# Patient Record
Sex: Female | Born: 1935 | Race: White | Hispanic: No | Smoking: Never smoker
Health system: Southern US, Community
[De-identification: ages and names within clinical notes are randomized; demographics above are authoritative.]

## PROBLEM LIST (undated history)

## (undated) DIAGNOSIS — T7840XA Allergy, unspecified, initial encounter: Secondary | ICD-10-CM

## (undated) DIAGNOSIS — C439 Malignant melanoma of skin, unspecified: Secondary | ICD-10-CM

## (undated) DIAGNOSIS — K449 Diaphragmatic hernia without obstruction or gangrene: Secondary | ICD-10-CM

## (undated) DIAGNOSIS — Z9289 Personal history of other medical treatment: Secondary | ICD-10-CM

## (undated) DIAGNOSIS — D689 Coagulation defect, unspecified: Secondary | ICD-10-CM

## (undated) DIAGNOSIS — K5792 Diverticulitis of intestine, part unspecified, without perforation or abscess without bleeding: Secondary | ICD-10-CM

## (undated) DIAGNOSIS — Z950 Presence of cardiac pacemaker: Secondary | ICD-10-CM

## (undated) DIAGNOSIS — Z923 Personal history of irradiation: Secondary | ICD-10-CM

## (undated) DIAGNOSIS — I2510 Atherosclerotic heart disease of native coronary artery without angina pectoris: Secondary | ICD-10-CM

## (undated) DIAGNOSIS — C73 Malignant neoplasm of thyroid gland: Secondary | ICD-10-CM

## (undated) DIAGNOSIS — M1990 Unspecified osteoarthritis, unspecified site: Secondary | ICD-10-CM

## (undated) DIAGNOSIS — I499 Cardiac arrhythmia, unspecified: Secondary | ICD-10-CM

## (undated) DIAGNOSIS — Z79899 Other long term (current) drug therapy: Secondary | ICD-10-CM

## (undated) DIAGNOSIS — G5600 Carpal tunnel syndrome, unspecified upper limb: Secondary | ICD-10-CM

## (undated) DIAGNOSIS — I4819 Other persistent atrial fibrillation: Secondary | ICD-10-CM

## (undated) DIAGNOSIS — I509 Heart failure, unspecified: Secondary | ICD-10-CM

## (undated) DIAGNOSIS — K529 Noninfective gastroenteritis and colitis, unspecified: Secondary | ICD-10-CM

## (undated) DIAGNOSIS — G4730 Sleep apnea, unspecified: Secondary | ICD-10-CM

## (undated) DIAGNOSIS — IMO0002 Reserved for concepts with insufficient information to code with codable children: Secondary | ICD-10-CM

## (undated) DIAGNOSIS — E7800 Pure hypercholesterolemia, unspecified: Secondary | ICD-10-CM

## (undated) DIAGNOSIS — Z7901 Long term (current) use of anticoagulants: Secondary | ICD-10-CM

## (undated) DIAGNOSIS — E039 Hypothyroidism, unspecified: Secondary | ICD-10-CM

## (undated) DIAGNOSIS — M5030 Other cervical disc degeneration, unspecified cervical region: Secondary | ICD-10-CM

## (undated) DIAGNOSIS — I495 Sick sinus syndrome: Secondary | ICD-10-CM

## (undated) DIAGNOSIS — K219 Gastro-esophageal reflux disease without esophagitis: Secondary | ICD-10-CM

## (undated) DIAGNOSIS — G4733 Obstructive sleep apnea (adult) (pediatric): Secondary | ICD-10-CM

## (undated) DIAGNOSIS — I447 Left bundle-branch block, unspecified: Secondary | ICD-10-CM

## (undated) DIAGNOSIS — E559 Vitamin D deficiency, unspecified: Secondary | ICD-10-CM

## (undated) DIAGNOSIS — H3530 Unspecified macular degeneration: Secondary | ICD-10-CM

## (undated) DIAGNOSIS — R0789 Other chest pain: Secondary | ICD-10-CM

## (undated) DIAGNOSIS — D0360 Melanoma in situ of unspecified upper limb, including shoulder: Secondary | ICD-10-CM

## (undated) DIAGNOSIS — I10 Essential (primary) hypertension: Secondary | ICD-10-CM

## (undated) DIAGNOSIS — C50919 Malignant neoplasm of unspecified site of unspecified female breast: Secondary | ICD-10-CM

## (undated) DIAGNOSIS — F329 Major depressive disorder, single episode, unspecified: Secondary | ICD-10-CM

## (undated) DIAGNOSIS — F32A Depression, unspecified: Secondary | ICD-10-CM

## (undated) DIAGNOSIS — I700 Atherosclerosis of aorta: Secondary | ICD-10-CM

## (undated) DIAGNOSIS — G250 Essential tremor: Secondary | ICD-10-CM

## (undated) DIAGNOSIS — F419 Anxiety disorder, unspecified: Secondary | ICD-10-CM

## (undated) HISTORY — DX: Coagulation defect, unspecified: D68.9

## (undated) HISTORY — DX: Other persistent atrial fibrillation: I48.19

## (undated) HISTORY — DX: Personal history of other medical treatment: Z92.89

## (undated) HISTORY — DX: Reserved for concepts with insufficient information to code with codable children: IMO0002

## (undated) HISTORY — DX: Other chest pain: R07.89

## (undated) HISTORY — DX: Depression, unspecified: F32.A

## (undated) HISTORY — DX: Sleep apnea, unspecified: G47.30

## (undated) HISTORY — DX: Malignant neoplasm of unspecified site of unspecified female breast: C50.919

## (undated) HISTORY — PX: CHOLECYSTECTOMY: SHX55

## (undated) HISTORY — PX: PARTIAL HYSTERECTOMY: SHX80

## (undated) HISTORY — DX: Unspecified osteoarthritis, unspecified site: M19.90

## (undated) HISTORY — DX: Heart failure, unspecified: I50.9

## (undated) HISTORY — DX: Diverticulitis of intestine, part unspecified, without perforation or abscess without bleeding: K57.92

## (undated) HISTORY — DX: Pure hypercholesterolemia, unspecified: E78.00

## (undated) HISTORY — DX: Malignant neoplasm of thyroid gland: C73

## (undated) HISTORY — PX: TONSILLECTOMY: SUR1361

## (undated) HISTORY — DX: Allergy, unspecified, initial encounter: T78.40XA

## (undated) HISTORY — DX: Essential (primary) hypertension: I10

## (undated) HISTORY — PX: CARDIAC CATHETERIZATION: SHX172

## (undated) HISTORY — PX: MELANOMA EXCISION: SHX5266

## (undated) HISTORY — DX: Noninfective gastroenteritis and colitis, unspecified: K52.9

## (undated) HISTORY — DX: Gastro-esophageal reflux disease without esophagitis: K21.9

## (undated) HISTORY — DX: Anxiety disorder, unspecified: F41.9

## (undated) HISTORY — DX: Major depressive disorder, single episode, unspecified: F32.9

## (undated) HISTORY — DX: Melanoma in situ of unspecified upper limb, including shoulder: D03.60

---

## 1971-09-27 HISTORY — PX: ABDOMINAL HYSTERECTOMY: SHX81

## 1990-09-26 DIAGNOSIS — C73 Malignant neoplasm of thyroid gland: Secondary | ICD-10-CM

## 1990-09-26 HISTORY — PX: THYROIDECTOMY, PARTIAL: SHX18

## 1990-09-26 HISTORY — PX: THYROID SURGERY: SHX805

## 1990-09-26 HISTORY — DX: Malignant neoplasm of thyroid gland: C73

## 1993-09-26 HISTORY — PX: BREAST EXCISIONAL BIOPSY: SUR124

## 1995-09-27 DIAGNOSIS — C439 Malignant melanoma of skin, unspecified: Secondary | ICD-10-CM

## 1995-09-27 HISTORY — DX: Malignant melanoma of skin, unspecified: C43.9

## 2001-02-06 ENCOUNTER — Encounter: Payer: Self-pay | Admitting: Family Medicine

## 2001-02-06 ENCOUNTER — Ambulatory Visit (HOSPITAL_COMMUNITY): Admission: RE | Admit: 2001-02-06 | Discharge: 2001-02-06 | Payer: Self-pay | Admitting: Family Medicine

## 2001-02-06 ENCOUNTER — Other Ambulatory Visit: Admission: RE | Admit: 2001-02-06 | Discharge: 2001-02-06 | Payer: Self-pay | Admitting: Family Medicine

## 2001-02-28 ENCOUNTER — Ambulatory Visit (HOSPITAL_COMMUNITY): Admission: RE | Admit: 2001-02-28 | Discharge: 2001-02-28 | Payer: Self-pay | Admitting: Cardiology

## 2001-02-28 ENCOUNTER — Encounter: Payer: Self-pay | Admitting: Cardiology

## 2001-03-01 ENCOUNTER — Ambulatory Visit (HOSPITAL_COMMUNITY): Admission: RE | Admit: 2001-03-01 | Discharge: 2001-03-01 | Payer: Self-pay | Admitting: Family Medicine

## 2001-03-01 ENCOUNTER — Encounter: Payer: Self-pay | Admitting: Family Medicine

## 2001-07-24 ENCOUNTER — Encounter: Payer: Self-pay | Admitting: Family Medicine

## 2001-07-24 ENCOUNTER — Ambulatory Visit (HOSPITAL_COMMUNITY): Admission: RE | Admit: 2001-07-24 | Discharge: 2001-07-24 | Payer: Self-pay | Admitting: Family Medicine

## 2002-03-04 ENCOUNTER — Encounter: Payer: Self-pay | Admitting: Family Medicine

## 2002-03-04 ENCOUNTER — Ambulatory Visit (HOSPITAL_COMMUNITY): Admission: RE | Admit: 2002-03-04 | Discharge: 2002-03-04 | Payer: Self-pay | Admitting: Family Medicine

## 2002-04-29 ENCOUNTER — Ambulatory Visit (HOSPITAL_COMMUNITY): Admission: RE | Admit: 2002-04-29 | Discharge: 2002-04-29 | Payer: Self-pay | Admitting: Family Medicine

## 2002-04-29 ENCOUNTER — Encounter: Payer: Self-pay | Admitting: Family Medicine

## 2003-02-26 ENCOUNTER — Ambulatory Visit (HOSPITAL_COMMUNITY): Admission: RE | Admit: 2003-02-26 | Discharge: 2003-02-26 | Payer: Self-pay | Admitting: Family Medicine

## 2003-02-26 ENCOUNTER — Encounter: Payer: Self-pay | Admitting: Family Medicine

## 2003-03-13 ENCOUNTER — Ambulatory Visit (HOSPITAL_COMMUNITY): Admission: RE | Admit: 2003-03-13 | Discharge: 2003-03-13 | Payer: Self-pay | Admitting: Family Medicine

## 2003-03-13 ENCOUNTER — Encounter: Payer: Self-pay | Admitting: Family Medicine

## 2003-06-23 ENCOUNTER — Ambulatory Visit (HOSPITAL_COMMUNITY): Admission: RE | Admit: 2003-06-23 | Discharge: 2003-06-23 | Payer: Self-pay | Admitting: Cardiology

## 2003-06-23 ENCOUNTER — Encounter: Payer: Self-pay | Admitting: Cardiology

## 2004-02-03 ENCOUNTER — Ambulatory Visit (HOSPITAL_COMMUNITY): Admission: RE | Admit: 2004-02-03 | Discharge: 2004-02-03 | Payer: Self-pay | Admitting: Internal Medicine

## 2004-06-08 ENCOUNTER — Ambulatory Visit: Payer: Self-pay | Admitting: Psychiatry

## 2004-07-19 ENCOUNTER — Ambulatory Visit: Payer: Self-pay | Admitting: Psychology

## 2004-10-05 ENCOUNTER — Ambulatory Visit: Payer: Self-pay | Admitting: Psychology

## 2004-10-19 ENCOUNTER — Ambulatory Visit: Payer: Self-pay | Admitting: Psychiatry

## 2004-10-21 ENCOUNTER — Ambulatory Visit (HOSPITAL_COMMUNITY): Admission: RE | Admit: 2004-10-21 | Discharge: 2004-10-21 | Payer: Self-pay | Admitting: Family Medicine

## 2004-12-09 ENCOUNTER — Ambulatory Visit: Payer: Self-pay | Admitting: Psychiatry

## 2005-01-31 ENCOUNTER — Other Ambulatory Visit: Admission: RE | Admit: 2005-01-31 | Discharge: 2005-01-31 | Payer: Self-pay | Admitting: Dermatology

## 2005-02-10 ENCOUNTER — Ambulatory Visit: Payer: Self-pay | Admitting: Orthopedic Surgery

## 2005-03-08 ENCOUNTER — Ambulatory Visit: Payer: Self-pay | Admitting: Psychiatry

## 2005-05-03 ENCOUNTER — Ambulatory Visit: Payer: Self-pay | Admitting: Psychiatry

## 2005-06-28 ENCOUNTER — Ambulatory Visit: Payer: Self-pay | Admitting: Psychiatry

## 2005-09-15 ENCOUNTER — Ambulatory Visit: Payer: Self-pay | Admitting: Psychology

## 2005-10-24 ENCOUNTER — Ambulatory Visit (HOSPITAL_COMMUNITY): Admission: RE | Admit: 2005-10-24 | Discharge: 2005-10-24 | Payer: Self-pay | Admitting: Family Medicine

## 2005-10-28 ENCOUNTER — Ambulatory Visit: Payer: Self-pay | Admitting: Psychology

## 2005-12-26 ENCOUNTER — Ambulatory Visit (HOSPITAL_COMMUNITY): Admission: RE | Admit: 2005-12-26 | Discharge: 2005-12-26 | Payer: Self-pay | Admitting: Family Medicine

## 2006-02-16 ENCOUNTER — Ambulatory Visit (HOSPITAL_COMMUNITY): Payer: Self-pay | Admitting: Psychiatry

## 2006-02-23 ENCOUNTER — Ambulatory Visit: Payer: Self-pay | Admitting: Orthopedic Surgery

## 2006-03-02 ENCOUNTER — Ambulatory Visit (HOSPITAL_COMMUNITY): Payer: Self-pay | Admitting: Psychology

## 2006-03-09 ENCOUNTER — Ambulatory Visit: Payer: Self-pay | Admitting: Orthopedic Surgery

## 2006-03-14 ENCOUNTER — Ambulatory Visit (HOSPITAL_COMMUNITY): Admission: RE | Admit: 2006-03-14 | Discharge: 2006-03-14 | Payer: Self-pay | Admitting: Orthopedic Surgery

## 2006-04-06 ENCOUNTER — Ambulatory Visit: Payer: Self-pay | Admitting: Orthopedic Surgery

## 2006-04-13 ENCOUNTER — Ambulatory Visit (HOSPITAL_COMMUNITY): Payer: Self-pay | Admitting: Psychiatry

## 2006-04-20 ENCOUNTER — Ambulatory Visit (HOSPITAL_COMMUNITY): Payer: Self-pay | Admitting: Psychiatry

## 2006-04-25 ENCOUNTER — Ambulatory Visit (HOSPITAL_COMMUNITY): Payer: Self-pay | Admitting: Psychology

## 2006-04-27 ENCOUNTER — Encounter: Admission: RE | Admit: 2006-04-27 | Discharge: 2006-04-27 | Payer: Self-pay | Admitting: Orthopedic Surgery

## 2006-05-15 ENCOUNTER — Encounter: Admission: RE | Admit: 2006-05-15 | Discharge: 2006-05-15 | Payer: Self-pay | Admitting: Orthopedic Surgery

## 2006-05-16 ENCOUNTER — Ambulatory Visit (HOSPITAL_COMMUNITY): Payer: Self-pay | Admitting: Psychiatry

## 2006-06-05 ENCOUNTER — Ambulatory Visit (HOSPITAL_COMMUNITY): Payer: Self-pay | Admitting: Psychology

## 2006-06-07 ENCOUNTER — Encounter: Admission: RE | Admit: 2006-06-07 | Discharge: 2006-06-07 | Payer: Self-pay | Admitting: Orthopedic Surgery

## 2006-07-13 ENCOUNTER — Ambulatory Visit (HOSPITAL_COMMUNITY): Payer: Self-pay | Admitting: Psychiatry

## 2006-08-28 ENCOUNTER — Ambulatory Visit (HOSPITAL_COMMUNITY): Payer: Self-pay | Admitting: Psychology

## 2006-09-12 ENCOUNTER — Ambulatory Visit (HOSPITAL_COMMUNITY): Payer: Self-pay | Admitting: Psychiatry

## 2006-11-14 ENCOUNTER — Ambulatory Visit (HOSPITAL_COMMUNITY): Payer: Self-pay | Admitting: Psychiatry

## 2006-12-01 ENCOUNTER — Ambulatory Visit (HOSPITAL_COMMUNITY): Admission: RE | Admit: 2006-12-01 | Discharge: 2006-12-01 | Payer: Self-pay | Admitting: Podiatry

## 2006-12-08 ENCOUNTER — Ambulatory Visit (HOSPITAL_COMMUNITY): Admission: RE | Admit: 2006-12-08 | Discharge: 2006-12-08 | Payer: Self-pay | Admitting: *Deleted

## 2006-12-13 ENCOUNTER — Ambulatory Visit (HOSPITAL_COMMUNITY): Admission: RE | Admit: 2006-12-13 | Discharge: 2006-12-14 | Payer: Self-pay | Admitting: *Deleted

## 2006-12-19 ENCOUNTER — Ambulatory Visit (HOSPITAL_COMMUNITY): Payer: Self-pay | Admitting: Psychiatry

## 2007-02-20 ENCOUNTER — Ambulatory Visit (HOSPITAL_COMMUNITY): Admission: RE | Admit: 2007-02-20 | Discharge: 2007-02-20 | Payer: Self-pay | Admitting: Family Medicine

## 2007-04-19 ENCOUNTER — Ambulatory Visit (HOSPITAL_COMMUNITY): Payer: Self-pay | Admitting: Psychiatry

## 2007-05-25 ENCOUNTER — Ambulatory Visit (HOSPITAL_COMMUNITY): Payer: Self-pay | Admitting: Psychology

## 2007-06-26 ENCOUNTER — Ambulatory Visit (HOSPITAL_COMMUNITY): Payer: Self-pay | Admitting: Psychology

## 2007-07-05 ENCOUNTER — Ambulatory Visit (HOSPITAL_COMMUNITY): Payer: Self-pay | Admitting: Psychiatry

## 2007-08-27 ENCOUNTER — Ambulatory Visit (HOSPITAL_COMMUNITY): Payer: Self-pay | Admitting: Psychology

## 2007-09-24 ENCOUNTER — Ambulatory Visit (HOSPITAL_COMMUNITY): Admission: RE | Admit: 2007-09-24 | Discharge: 2007-09-24 | Payer: Self-pay | Admitting: Ophthalmology

## 2007-09-25 ENCOUNTER — Ambulatory Visit (HOSPITAL_COMMUNITY): Admission: RE | Admit: 2007-09-25 | Discharge: 2007-09-25 | Payer: Self-pay | Admitting: Family Medicine

## 2007-09-28 ENCOUNTER — Ambulatory Visit (HOSPITAL_COMMUNITY): Admission: RE | Admit: 2007-09-28 | Discharge: 2007-09-28 | Payer: Self-pay | Admitting: Family Medicine

## 2007-10-01 ENCOUNTER — Ambulatory Visit (HOSPITAL_COMMUNITY): Admission: RE | Admit: 2007-10-01 | Discharge: 2007-10-01 | Payer: Self-pay | Admitting: Ophthalmology

## 2007-10-09 ENCOUNTER — Encounter: Admission: RE | Admit: 2007-10-09 | Discharge: 2007-10-09 | Payer: Self-pay | Admitting: Family Medicine

## 2007-10-09 ENCOUNTER — Encounter (INDEPENDENT_AMBULATORY_CARE_PROVIDER_SITE_OTHER): Payer: Self-pay | Admitting: Radiology

## 2008-05-08 ENCOUNTER — Ambulatory Visit (HOSPITAL_COMMUNITY): Payer: Self-pay | Admitting: Psychiatry

## 2008-06-05 ENCOUNTER — Ambulatory Visit (HOSPITAL_COMMUNITY): Payer: Self-pay | Admitting: Psychiatry

## 2008-08-07 ENCOUNTER — Ambulatory Visit (HOSPITAL_COMMUNITY): Payer: Self-pay | Admitting: Psychiatry

## 2008-11-06 ENCOUNTER — Ambulatory Visit (HOSPITAL_COMMUNITY): Payer: Self-pay | Admitting: Psychiatry

## 2008-11-10 ENCOUNTER — Ambulatory Visit (HOSPITAL_COMMUNITY): Admission: RE | Admit: 2008-11-10 | Discharge: 2008-11-10 | Payer: Self-pay | Admitting: Family Medicine

## 2008-11-13 ENCOUNTER — Ambulatory Visit (HOSPITAL_COMMUNITY): Admission: RE | Admit: 2008-11-13 | Discharge: 2008-11-13 | Payer: Self-pay | Admitting: Obstetrics & Gynecology

## 2008-12-13 ENCOUNTER — Observation Stay (HOSPITAL_COMMUNITY): Admission: EM | Admit: 2008-12-13 | Discharge: 2008-12-13 | Payer: Self-pay | Admitting: Cardiology

## 2009-03-05 ENCOUNTER — Ambulatory Visit (HOSPITAL_COMMUNITY): Payer: Self-pay | Admitting: Psychiatry

## 2009-05-18 ENCOUNTER — Ambulatory Visit (HOSPITAL_COMMUNITY): Admission: RE | Admit: 2009-05-18 | Discharge: 2009-05-18 | Payer: Self-pay | Admitting: Family Medicine

## 2009-05-20 ENCOUNTER — Encounter (INDEPENDENT_AMBULATORY_CARE_PROVIDER_SITE_OTHER): Payer: Self-pay | Admitting: *Deleted

## 2009-05-20 ENCOUNTER — Ambulatory Visit (HOSPITAL_COMMUNITY): Admission: RE | Admit: 2009-05-20 | Discharge: 2009-05-20 | Payer: Self-pay | Admitting: Family Medicine

## 2009-05-26 ENCOUNTER — Ambulatory Visit: Payer: Self-pay | Admitting: Internal Medicine

## 2009-05-26 ENCOUNTER — Ambulatory Visit (HOSPITAL_COMMUNITY): Admission: RE | Admit: 2009-05-26 | Discharge: 2009-05-26 | Payer: Self-pay | Admitting: Internal Medicine

## 2009-05-26 DIAGNOSIS — R109 Unspecified abdominal pain: Secondary | ICD-10-CM | POA: Insufficient documentation

## 2009-05-26 DIAGNOSIS — R7401 Elevation of levels of liver transaminase levels: Secondary | ICD-10-CM | POA: Insufficient documentation

## 2009-05-26 DIAGNOSIS — R740 Nonspecific elevation of levels of transaminase and lactic acid dehydrogenase [LDH]: Secondary | ICD-10-CM

## 2009-05-26 DIAGNOSIS — K219 Gastro-esophageal reflux disease without esophagitis: Secondary | ICD-10-CM | POA: Insufficient documentation

## 2009-05-27 LAB — CONVERTED CEMR LAB
ALT: 92 units/L — ABNORMAL HIGH (ref 0–35)
AST: 163 units/L — ABNORMAL HIGH (ref 0–37)
Bilirubin, Direct: 0.7 mg/dL — ABNORMAL HIGH (ref 0.0–0.3)
Indirect Bilirubin: 0.5 mg/dL (ref 0.0–0.9)

## 2009-05-28 ENCOUNTER — Encounter: Payer: Self-pay | Admitting: Internal Medicine

## 2009-06-04 ENCOUNTER — Ambulatory Visit (HOSPITAL_COMMUNITY): Admission: RE | Admit: 2009-06-04 | Discharge: 2009-06-04 | Payer: Self-pay | Admitting: Internal Medicine

## 2009-06-04 ENCOUNTER — Ambulatory Visit: Payer: Self-pay | Admitting: Internal Medicine

## 2009-06-10 ENCOUNTER — Telehealth (INDEPENDENT_AMBULATORY_CARE_PROVIDER_SITE_OTHER): Payer: Self-pay | Admitting: *Deleted

## 2009-06-15 ENCOUNTER — Encounter: Payer: Self-pay | Admitting: Internal Medicine

## 2009-07-02 ENCOUNTER — Encounter: Payer: Self-pay | Admitting: Internal Medicine

## 2009-07-02 ENCOUNTER — Ambulatory Visit (HOSPITAL_COMMUNITY): Admission: RE | Admit: 2009-07-02 | Discharge: 2009-07-02 | Payer: Self-pay | Admitting: Gastroenterology

## 2009-07-02 ENCOUNTER — Ambulatory Visit: Payer: Self-pay | Admitting: Gastroenterology

## 2009-07-07 ENCOUNTER — Ambulatory Visit (HOSPITAL_COMMUNITY): Payer: Self-pay | Admitting: Psychiatry

## 2009-07-23 ENCOUNTER — Ambulatory Visit (HOSPITAL_COMMUNITY): Admission: RE | Admit: 2009-07-23 | Discharge: 2009-07-23 | Payer: Self-pay | Admitting: Ophthalmology

## 2009-07-29 ENCOUNTER — Ambulatory Visit (HOSPITAL_COMMUNITY): Admission: RE | Admit: 2009-07-29 | Discharge: 2009-07-29 | Payer: Self-pay | Admitting: Ophthalmology

## 2009-08-03 ENCOUNTER — Encounter: Payer: Self-pay | Admitting: Internal Medicine

## 2009-08-05 ENCOUNTER — Encounter: Payer: Self-pay | Admitting: Internal Medicine

## 2009-08-31 ENCOUNTER — Ambulatory Visit (HOSPITAL_COMMUNITY): Admission: RE | Admit: 2009-08-31 | Discharge: 2009-08-31 | Payer: Self-pay | Admitting: Family Medicine

## 2009-09-26 DIAGNOSIS — N6489 Other specified disorders of breast: Secondary | ICD-10-CM

## 2009-09-26 DIAGNOSIS — Z923 Personal history of irradiation: Secondary | ICD-10-CM

## 2009-09-26 DIAGNOSIS — C50919 Malignant neoplasm of unspecified site of unspecified female breast: Secondary | ICD-10-CM

## 2009-09-26 HISTORY — PX: BREAST EXCISIONAL BIOPSY: SUR124

## 2009-09-26 HISTORY — DX: Personal history of irradiation: Z92.3

## 2009-09-26 HISTORY — DX: Other specified disorders of breast: N64.89

## 2009-09-26 HISTORY — PX: BREAST LUMPECTOMY: SHX2

## 2009-09-26 HISTORY — DX: Malignant neoplasm of unspecified site of unspecified female breast: C50.919

## 2009-11-16 ENCOUNTER — Ambulatory Visit (HOSPITAL_COMMUNITY): Admission: RE | Admit: 2009-11-16 | Discharge: 2009-11-16 | Payer: Self-pay | Admitting: Family Medicine

## 2009-11-25 ENCOUNTER — Ambulatory Visit (HOSPITAL_COMMUNITY): Admission: RE | Admit: 2009-11-25 | Discharge: 2009-11-25 | Payer: Self-pay | Admitting: Family Medicine

## 2009-12-03 ENCOUNTER — Encounter: Admission: RE | Admit: 2009-12-03 | Discharge: 2009-12-03 | Payer: Self-pay | Admitting: Family Medicine

## 2009-12-15 ENCOUNTER — Ambulatory Visit (HOSPITAL_COMMUNITY): Payer: Self-pay | Admitting: Psychiatry

## 2009-12-16 ENCOUNTER — Observation Stay (HOSPITAL_COMMUNITY): Admission: RE | Admit: 2009-12-16 | Discharge: 2009-12-17 | Payer: Self-pay | Admitting: General Surgery

## 2010-01-26 ENCOUNTER — Ambulatory Visit (HOSPITAL_COMMUNITY): Payer: Self-pay | Admitting: Oncology

## 2010-01-29 ENCOUNTER — Ambulatory Visit (HOSPITAL_COMMUNITY): Admission: RE | Admit: 2010-01-29 | Discharge: 2010-01-29 | Payer: Self-pay | Admitting: Oncology

## 2010-02-10 ENCOUNTER — Ambulatory Visit: Admission: RE | Admit: 2010-02-10 | Discharge: 2010-03-25 | Payer: Self-pay | Admitting: Radiation Oncology

## 2010-02-26 ENCOUNTER — Ambulatory Visit (HOSPITAL_COMMUNITY): Admission: RE | Admit: 2010-02-26 | Discharge: 2010-02-26 | Payer: Self-pay | Admitting: Family Medicine

## 2010-03-16 ENCOUNTER — Ambulatory Visit (HOSPITAL_COMMUNITY): Payer: Self-pay | Admitting: Psychiatry

## 2010-03-30 ENCOUNTER — Ambulatory Visit (HOSPITAL_COMMUNITY): Payer: Self-pay | Admitting: Oncology

## 2010-06-15 ENCOUNTER — Ambulatory Visit (HOSPITAL_COMMUNITY): Payer: Self-pay | Admitting: Psychiatry

## 2010-09-06 ENCOUNTER — Encounter (HOSPITAL_COMMUNITY)
Admission: RE | Admit: 2010-09-06 | Discharge: 2010-10-06 | Payer: Self-pay | Source: Home / Self Care | Attending: Oncology | Admitting: Oncology

## 2010-09-06 ENCOUNTER — Ambulatory Visit (HOSPITAL_COMMUNITY): Payer: Self-pay | Admitting: Oncology

## 2010-09-14 ENCOUNTER — Ambulatory Visit (HOSPITAL_COMMUNITY): Payer: Self-pay | Admitting: Psychiatry

## 2010-09-23 ENCOUNTER — Ambulatory Visit (HOSPITAL_COMMUNITY)
Admission: RE | Admit: 2010-09-23 | Discharge: 2010-09-23 | Payer: Self-pay | Source: Home / Self Care | Attending: Psychiatry | Admitting: Psychiatry

## 2010-10-16 ENCOUNTER — Other Ambulatory Visit (HOSPITAL_COMMUNITY): Payer: Self-pay | Admitting: Oncology

## 2010-10-16 DIAGNOSIS — M8580 Other specified disorders of bone density and structure, unspecified site: Secondary | ICD-10-CM

## 2010-10-17 ENCOUNTER — Encounter: Payer: Self-pay | Admitting: Family Medicine

## 2010-10-27 ENCOUNTER — Ambulatory Visit (HOSPITAL_COMMUNITY): Payer: Medicare Other | Admitting: Oncology

## 2010-10-27 ENCOUNTER — Encounter (HOSPITAL_COMMUNITY): Admission: RE | Admit: 2010-10-27 | Payer: Self-pay | Source: Home / Self Care | Admitting: Oncology

## 2010-10-27 DIAGNOSIS — C50919 Malignant neoplasm of unspecified site of unspecified female breast: Secondary | ICD-10-CM

## 2010-11-22 ENCOUNTER — Other Ambulatory Visit: Payer: Self-pay | Admitting: Radiation Oncology

## 2010-11-22 DIAGNOSIS — C50919 Malignant neoplasm of unspecified site of unspecified female breast: Secondary | ICD-10-CM

## 2010-11-23 ENCOUNTER — Encounter (INDEPENDENT_AMBULATORY_CARE_PROVIDER_SITE_OTHER): Payer: Medicare Other | Admitting: Psychiatry

## 2010-11-23 DIAGNOSIS — F332 Major depressive disorder, recurrent severe without psychotic features: Secondary | ICD-10-CM

## 2010-12-20 LAB — MRSA PCR SCREENING: MRSA by PCR: NEGATIVE

## 2010-12-20 LAB — CROSSMATCH

## 2010-12-20 LAB — BASIC METABOLIC PANEL
CO2: 30 mEq/L (ref 19–32)
Chloride: 106 mEq/L (ref 96–112)
Creatinine, Ser: 0.84 mg/dL (ref 0.4–1.2)

## 2010-12-20 LAB — CBC
HCT: 35.9 % — ABNORMAL LOW (ref 36.0–46.0)
Hemoglobin: 12.5 g/dL (ref 12.0–15.0)
MCV: 90 fL (ref 78.0–100.0)
Platelets: 221 10*3/uL (ref 150–400)
WBC: 6.3 10*3/uL (ref 4.0–10.5)

## 2010-12-20 LAB — ABO/RH: ABO/RH(D): O POS

## 2010-12-20 LAB — PROTIME-INR: INR: 1.09 (ref 0.00–1.49)

## 2010-12-29 ENCOUNTER — Ambulatory Visit (HOSPITAL_COMMUNITY)
Admission: RE | Admit: 2010-12-29 | Discharge: 2010-12-29 | Disposition: A | Discharge status: Home / Self Care | Payer: Medicare Other | Source: Ambulatory Visit | Attending: Radiation Oncology | Admitting: Radiation Oncology

## 2010-12-29 DIAGNOSIS — C50919 Malignant neoplasm of unspecified site of unspecified female breast: Secondary | ICD-10-CM

## 2010-12-29 DIAGNOSIS — Z853 Personal history of malignant neoplasm of breast: Secondary | ICD-10-CM | POA: Insufficient documentation

## 2010-12-30 LAB — BASIC METABOLIC PANEL
Chloride: 106 mEq/L (ref 96–112)
GFR calc non Af Amer: >60 mL/min (ref >60)
Potassium: 3.5 mEq/L (ref 3.5–5.1)
Sodium: 141 mEq/L (ref 135–145)

## 2010-12-30 LAB — HEMOGLOBIN AND HEMATOCRIT, BLOOD
HCT: 35.6 % — ABNORMAL LOW (ref 36.0–46.0)
Hemoglobin: 12.3 g/dL (ref 12.0–15.0)

## 2010-12-31 LAB — PROTIME-INR
INR: 2.2 — ABNORMAL HIGH (ref 0.00–1.49)
Prothrombin Time: 24.5 seconds — ABNORMAL HIGH (ref 11.6–15.2)

## 2011-01-06 LAB — CBC
HCT: 34.6 % — ABNORMAL LOW (ref 36.0–46.0)
Hemoglobin: 11.8 g/dL — ABNORMAL LOW (ref 12.0–15.0)
MCHC: 34.1 g/dL (ref 30.0–36.0)
MCHC: 33.9 g/dL (ref 30.0–36.0)
MCV: 91.9 fL (ref 78.0–100.0)
Platelets: 228 10*3/uL (ref 150–400)
RBC: 3.77 MIL/uL — ABNORMAL LOW (ref 3.87–5.11)
RDW: 12.5 % (ref 11.5–15.5)
RDW: 12.5 % (ref 11.5–15.5)

## 2011-01-06 LAB — BASIC METABOLIC PANEL
BUN: 14 mg/dL (ref 6–23)
CO2: 29 mEq/L (ref 19–32)
Calcium: 9.6 mg/dL (ref 8.4–10.5)
Chloride: 100 mEq/L (ref 96–112)
Creatinine, Ser: 0.8 mg/dL (ref 0.4–1.2)

## 2011-01-06 LAB — COMPREHENSIVE METABOLIC PANEL
Albumin: 3.1 g/dL — ABNORMAL LOW (ref 3.5–5.2)
Alkaline Phosphatase: 93 U/L (ref 39–117)
BUN: 11 mg/dL (ref 6–23)
Chloride: 102 mEq/L (ref 96–112)
Creatinine, Ser: 0.74 mg/dL (ref 0.4–1.2)
Glucose, Bld: 93 mg/dL (ref 70–99)
Total Bilirubin: 0.5 mg/dL (ref 0.3–1.2)
Total Protein: 5.6 g/dL — ABNORMAL LOW (ref 6.0–8.3)

## 2011-01-06 LAB — DIFFERENTIAL
Basophils Absolute: 0 10*3/uL (ref 0.0–0.1)
Basophils Relative: 1 % (ref 0–1)
Eosinophils Absolute: 0.1 10*3/uL (ref 0.0–0.7)
Monocytes Relative: 10 % (ref 3–12)
Neutro Abs: 3.1 10*3/uL (ref 1.7–7.7)
Neutrophils Relative %: 49 % (ref 43–77)

## 2011-01-06 LAB — POCT CARDIAC MARKERS
CKMB, poc: <1 ng/mL — ABNORMAL LOW (ref 1.0–8.0)
Troponin i, poc: <0.05 ng/mL (ref 0.00–0.09)
Troponin i, poc: <0.05 ng/mL (ref 0.00–0.09)

## 2011-01-06 LAB — PROTIME-INR
INR: 2.1 — ABNORMAL HIGH (ref 0.00–1.49)
Prothrombin Time: 25.3 seconds — ABNORMAL HIGH (ref 11.6–15.2)

## 2011-01-20 ENCOUNTER — Encounter (INDEPENDENT_AMBULATORY_CARE_PROVIDER_SITE_OTHER): Payer: Medicare Other | Admitting: Psychiatry

## 2011-01-20 DIAGNOSIS — F331 Major depressive disorder, recurrent, moderate: Secondary | ICD-10-CM

## 2011-02-11 NOTE — Discharge Summary (Signed)
NAME:  [name], [name] NO.:  [id number]   MEDICAL RECORD NO.:  [id number]          PATIENT TYPE:  OIB   LOCATION:  5703                         FACILITY:  [facility]   PHYSICIAN:  Lezlie Octave, N.P.     DATE OF BIRTH:  05-17-1931   DATE OF ADMISSION:  12/13/2006  DATE OF DISCHARGE:  12/14/2006                               DISCHARGE SUMMARY   Ms. Kerri Mills is a 77 year old white female patient who was seen by  Dr. Domingo Sep in the [place] office.  She apparently had worn a  Holter.  She had PAF, multiple other cardiac risk factors.  It was  decided that she should undergo cardiac catheterization prior to being  placed on Coumadin.  Thus, she came in for an outpatient cardiac  catheterization.  She had normal coronary arteries.  EF of 50%.  She had  some apical hypokinesis.  Catheterization was performed by Dr. Darlin Priestly.  She was to be discharged home; however, she developed a  hematoma, so she was kept overnight.  The following morning on December 14, 2006, she was seen by Dr. Clarene Duke.  She had no bruit or thrill at her  hematoma.  It was only a large bruise.  Her hemoglobin was 11.4.  Her  BUN was 10, creatinine was 0.99, potassium was 3.8, her INR was 1.1.  She had received one dose of Coumadin the night before.  It was decided  that she should start her Coumadin on Sunday.  She will have her blood  checked on Thursday.  It was decided also to decrease her Coumadin dose  down to 2.5 because she was also on Tricor, and I do have an  interaction.   DISCHARGE MEDICATIONS:  1. Coumadin 2.5 mg at 5 or 6 p.m.  She should start that on Sunday      daily.  2. Prilosec 20 mg a day.  3. Gabapentin 300 mg daily and 600 mg at bedtime.  4. Cymbalta 60 mg daily.  5. Avalide 300/25 a half a tablet daily.  6. Levothyroxine 100 mcg a day.  7. Ocuvite daily.  8. Metoprolol 12.5 twice daily.  9. Tricor 145 mg a day.   She should do no strenuous activity, no lifting,  pushing, pulling, or  prolonged walking for one week.  She will go to Dr. Roque Lias office on  Thursday to pick up her last slip to get her pro time checked, and she  will return to see Dr. Domingo Sep on April 4 at 2 p.m.   DISCHARGE DIAGNOSES:  1. Paroxysmal atrial fibrillation.  2. History of hypertension.  3. Hyperlipidemia.  4. Hypothyroidism.  5. History of partial thyroidectomy for thyroid cancer.  6. History of depression.  7. History of deep venous thrombosis.  8. History of obstructive sleep apnea.  9. History of reflux disease.  10.Ejection fraction of 50%.  11.Paroxysmal atrial fibrillation, now to be placed on Coumadin.      Lezlie Octave, N.P.     BB/MEDQ  D:  12/14/2006  T:  12/14/2006  Job:  [id number]   cc:   Patrica Duel, M.D.

## 2011-02-11 NOTE — Cardiovascular Report (Signed)
NAMEMIRCA, YALE NO.:  [id number]   MEDICAL RECORD NO.:  [id number]          PATIENT TYPE:  OIB   LOCATION:  2807                         FACILITY:  [facility]   PHYSICIAN:  Kerri Priestly, Kerri Mills  DATE OF BIRTH:  04-Feb-1931   DATE OF PROCEDURE:  12/13/2006  DATE OF DISCHARGE:                            CARDIAC CATHETERIZATION   PROCEDURES:  1. Left heart catheterization.  2. Coronary angiography.  3. Left ventriculogram.  4. Abdominal aortogram.   ATTENDING:  Dr. Lenise Herald   COMPLICATIONS:  None.   INDICATION:  Kerri Mills is a 77 year old female patient of Dr. Patrica Duel and Dr. Kem Boroughs with a history of hypertension,  hyperlipidemia, hypothyroidism and recently diagnosed with paroxysmal  atrial fibrillation.  She is now referred for cardiac catheterization  prior to initiating Coumadin therapy.   DESCRIPTION OF OPERATION:  After obtaining informed consent, the patient  was brought to the cardiac cath lab, right groin shaved, prepped and  draped in a sterile fashion.  Anesthesia monitor established.  Using the  modified Seldinger technique, a #6-French intraarterial sheath was  inserted in the right femoral artery.  A 6-French diagnostic catheter  was used to perform diagnostic angiography.   The left main is a large vessel with no evidence of disease.   The LAD is a medium-size vessel which courses to give rise to three  diagonal branches.  The LAD has noncritical disease.   First and second diagonal are small vessels with no evidence of disease.   The third diagonal is a medium-size vessel which bifurcates distally  with no evidence of disease.   The left circumflex is a medium-size vessel which courses to the AV  groove and gives rise to two obtuse marginal branches.  The AV  circumflex has no evidence of disease.   The first OM is a medium-size vessel which bifurcates distally with no  evidence of disease.   The second OM is a  small vessel with no evidence of disease.   The right coronary artery is a medium-size vessel which is dominant and  gives rise to both PDA and posterolateral branch.  There is no  significant disease in the RCA, PDA or posterolateral branch.   Left ventriculogram reveals preserved EF of 50%.  There is mild apical  hypokinesis.   Abdominal aortogram reveals no evidence of significant renal artery  stenosis.   Hemodynamics:  Systemic arterial pressure 142/53, LV systemic pressure  142/9, LVEDP of 14.   CONCLUSION:  1. No significant CAD.  2. Low-normal EF with wall motion abnormality noted above.  3. No evidence of renal artery stenosis.      Kerri Priestly, Kerri Mills  Electronically Signed     RHM/MEDQ  D:  12/13/2006  T:  12/13/2006  Job:  [id number]   cc:   Patrica Duel, M.D.  Dani Gobble, Kerri Mills

## 2011-02-11 NOTE — Op Note (Signed)
NAME:  Kerri Mills, CONES                            ACCOUNT NO.:  [id number]   MEDICAL RECORD NO.:  [id number]                   PATIENT TYPE:  AMB   LOCATION:  DAY                                  FACILITY:  [facility]   PHYSICIAN:  R. Roetta Sessions, M.D.              DATE OF BIRTH:  [birth year]   DATE OF PROCEDURE:  02/03/2004  DATE OF DISCHARGE:                                 OPERATIVE REPORT   PROCEDURE:  Screening colonoscopy.   INDICATIONS:  The patient is a 77 year old lady who was referred for  colorectal cancer screening.  She has been devoid of any lower GI symptoms.  She says she had a colonoscopy by Dr. Doristine Counter in [place] about 11 years  ago with negative findings.  Colonoscopy is now being done.  There is no  family history of colorectal neoplasia.  This approach has been discussed  with the patient at length.  The potential risks, benefits and alternatives  have been reviewed and questions answered.  Please see documentation in the  medical record.   DESCRIPTION OF PROCEDURE:  Oxygen saturation, blood pressure, pulse and  respiration were monitored throughout the entire procedure.  Conscious  sedation with Versed 4 mg IV and Demerol 100 mg IV in divided doses.  The  instrument was the Olympus video chip colonoscope.   FINDINGS:  Digital exam revealed no abnormalities.   ENDOSCOPIC FINDINGS:  Prep was good.  Rectum:  Examination of the rectal mucosa including retroflexion in the anal  verge revealed only internal hemorrhoids.  Colon:  Colonic mucosa was surveyed from the rectosigmoid junction to the  left, transverse,  right colon to the area of the appendiceal orifice,  ileocecal valve and cecum. These structures were well seen and photographed  for the record.  From the level the scope was slowly withdrawn.  All  previously mentioned mucosal surfaces were again seen.  The patient's colon  was elongated and tortuous.  Otherwise colonic mucosa appeared normal.  The  patient  tolerated the procedure well and was reactive after endoscopy.   IMPRESSION:  1. Internal hemorrhoids; otherwise normal rectum.  2. Tortuous, elongated, but otherwise normal colon.   RECOMMENDATIONS:  Repeat colonoscopy in 10 years.      ___________________________________________                                            Jonathon Bellows, M.D.   RMR/MEDQ  D:  02/03/2004  T:  02/04/2004  Job:  [id number]

## 2011-03-09 ENCOUNTER — Encounter (HOSPITAL_COMMUNITY): Payer: Self-pay

## 2011-03-09 DIAGNOSIS — K219 Gastro-esophageal reflux disease without esophagitis: Secondary | ICD-10-CM | POA: Insufficient documentation

## 2011-03-19 ENCOUNTER — Encounter (HOSPITAL_COMMUNITY): Payer: Self-pay | Admitting: Oncology

## 2011-03-19 ENCOUNTER — Other Ambulatory Visit (HOSPITAL_COMMUNITY): Payer: Self-pay | Admitting: Oncology

## 2011-03-19 DIAGNOSIS — C50919 Malignant neoplasm of unspecified site of unspecified female breast: Secondary | ICD-10-CM

## 2011-03-19 DIAGNOSIS — Z85850 Personal history of malignant neoplasm of thyroid: Secondary | ICD-10-CM | POA: Insufficient documentation

## 2011-03-19 DIAGNOSIS — C73 Malignant neoplasm of thyroid gland: Secondary | ICD-10-CM

## 2011-03-19 DIAGNOSIS — D0360 Melanoma in situ of unspecified upper limb, including shoulder: Secondary | ICD-10-CM

## 2011-03-19 HISTORY — DX: Melanoma in situ of unspecified upper limb, including shoulder: D03.60

## 2011-04-05 ENCOUNTER — Other Ambulatory Visit (HOSPITAL_COMMUNITY): Payer: Self-pay | Admitting: Oncology

## 2011-04-06 ENCOUNTER — Ambulatory Visit (HOSPITAL_COMMUNITY): Payer: Self-pay | Admitting: Oncology

## 2011-04-12 ENCOUNTER — Encounter (INDEPENDENT_AMBULATORY_CARE_PROVIDER_SITE_OTHER): Payer: Medicare Other | Admitting: Psychiatry

## 2011-04-12 DIAGNOSIS — F331 Major depressive disorder, recurrent, moderate: Secondary | ICD-10-CM

## 2011-04-14 ENCOUNTER — Encounter (HOSPITAL_COMMUNITY): Payer: Medicare Other | Admitting: Psychiatry

## 2011-04-19 ENCOUNTER — Encounter (HOSPITAL_COMMUNITY): Payer: No Typology Code available for payment source | Admitting: Psychology

## 2011-05-02 ENCOUNTER — Encounter (INDEPENDENT_AMBULATORY_CARE_PROVIDER_SITE_OTHER): Payer: Medicare Other | Admitting: Psychology

## 2011-05-02 DIAGNOSIS — F332 Major depressive disorder, recurrent severe without psychotic features: Secondary | ICD-10-CM

## 2011-05-02 DIAGNOSIS — F411 Generalized anxiety disorder: Secondary | ICD-10-CM

## 2011-05-16 ENCOUNTER — Encounter (INDEPENDENT_AMBULATORY_CARE_PROVIDER_SITE_OTHER): Payer: Medicare Other | Admitting: Psychology

## 2011-05-16 DIAGNOSIS — F411 Generalized anxiety disorder: Secondary | ICD-10-CM

## 2011-05-16 DIAGNOSIS — F332 Major depressive disorder, recurrent severe without psychotic features: Secondary | ICD-10-CM

## 2011-05-19 ENCOUNTER — Other Ambulatory Visit (HOSPITAL_COMMUNITY): Payer: Self-pay | Admitting: Family Medicine

## 2011-05-19 ENCOUNTER — Ambulatory Visit (HOSPITAL_COMMUNITY)
Admission: RE | Admit: 2011-05-19 | Discharge: 2011-05-19 | Disposition: A | Discharge status: Home / Self Care | Payer: Medicare Other | Source: Ambulatory Visit | Attending: Family Medicine | Admitting: Family Medicine

## 2011-05-19 DIAGNOSIS — M25539 Pain in unspecified wrist: Secondary | ICD-10-CM

## 2011-05-19 DIAGNOSIS — S59919A Unspecified injury of unspecified forearm, initial encounter: Secondary | ICD-10-CM | POA: Insufficient documentation

## 2011-05-19 DIAGNOSIS — S6990XA Unspecified injury of unspecified wrist, hand and finger(s), initial encounter: Secondary | ICD-10-CM | POA: Insufficient documentation

## 2011-05-19 DIAGNOSIS — S59909A Unspecified injury of unspecified elbow, initial encounter: Secondary | ICD-10-CM | POA: Insufficient documentation

## 2011-05-19 DIAGNOSIS — W19XXXA Unspecified fall, initial encounter: Secondary | ICD-10-CM | POA: Insufficient documentation

## 2011-05-31 ENCOUNTER — Other Ambulatory Visit (HOSPITAL_COMMUNITY): Payer: Self-pay | Admitting: Family Medicine

## 2011-05-31 ENCOUNTER — Ambulatory Visit (HOSPITAL_COMMUNITY)
Admission: RE | Admit: 2011-05-31 | Discharge: 2011-05-31 | Disposition: A | Discharge status: Home / Self Care | Payer: Medicare Other | Source: Ambulatory Visit | Attending: Family Medicine | Admitting: Family Medicine

## 2011-05-31 DIAGNOSIS — M76899 Other specified enthesopathies of unspecified lower limb, excluding foot: Secondary | ICD-10-CM

## 2011-05-31 DIAGNOSIS — M25569 Pain in unspecified knee: Secondary | ICD-10-CM | POA: Insufficient documentation

## 2011-06-08 ENCOUNTER — Encounter (HOSPITAL_COMMUNITY): Payer: Medicare Other | Admitting: Psychology

## 2011-07-05 ENCOUNTER — Encounter (HOSPITAL_COMMUNITY): Payer: No Typology Code available for payment source | Admitting: Psychiatry

## 2011-07-19 ENCOUNTER — Encounter (INDEPENDENT_AMBULATORY_CARE_PROVIDER_SITE_OTHER): Payer: Medicare Other | Admitting: Psychiatry

## 2011-07-19 DIAGNOSIS — F331 Major depressive disorder, recurrent, moderate: Secondary | ICD-10-CM

## 2011-08-05 ENCOUNTER — Encounter (HOSPITAL_COMMUNITY): Payer: Self-pay | Admitting: *Deleted

## 2011-08-05 ENCOUNTER — Other Ambulatory Visit (HOSPITAL_COMMUNITY): Payer: Self-pay | Admitting: Psychiatry

## 2011-08-05 NOTE — Telephone Encounter (Signed)
This encounter was created in error - please disregard.

## 2011-09-01 ENCOUNTER — Ambulatory Visit (HOSPITAL_COMMUNITY)
Admission: RE | Admit: 2011-09-01 | Discharge: 2011-09-01 | Disposition: A | Discharge status: Home / Self Care | Payer: Medicare Other | Source: Ambulatory Visit | Attending: Physician Assistant | Admitting: Physician Assistant

## 2011-09-01 ENCOUNTER — Other Ambulatory Visit (HOSPITAL_COMMUNITY): Payer: Self-pay | Admitting: Physician Assistant

## 2011-09-01 DIAGNOSIS — Z01419 Encounter for gynecological examination (general) (routine) without abnormal findings: Secondary | ICD-10-CM

## 2011-09-01 DIAGNOSIS — R079 Chest pain, unspecified: Secondary | ICD-10-CM | POA: Insufficient documentation

## 2011-09-27 DIAGNOSIS — K5792 Diverticulitis of intestine, part unspecified, without perforation or abscess without bleeding: Secondary | ICD-10-CM

## 2011-09-27 HISTORY — PX: COLONOSCOPY: SHX174

## 2011-09-27 HISTORY — PX: ESOPHAGOGASTRODUODENOSCOPY: SHX1529

## 2011-09-27 HISTORY — DX: Diverticulitis of intestine, part unspecified, without perforation or abscess without bleeding: K57.92

## 2011-10-11 ENCOUNTER — Ambulatory Visit (INDEPENDENT_AMBULATORY_CARE_PROVIDER_SITE_OTHER): Payer: Medicare Other | Admitting: Psychiatry

## 2011-10-11 ENCOUNTER — Encounter (HOSPITAL_COMMUNITY): Payer: Self-pay | Admitting: Psychiatry

## 2011-10-11 ENCOUNTER — Encounter (HOSPITAL_COMMUNITY): Payer: Medicare Other | Admitting: Psychiatry

## 2011-10-11 VITALS — Wt 187.0 lb

## 2011-10-11 DIAGNOSIS — F329 Major depressive disorder, single episode, unspecified: Secondary | ICD-10-CM

## 2011-10-11 NOTE — Progress Notes (Signed)
Patient came for her followup appointment. She was last seen in 07/19/2011. At that time patient was going through the grief as husband passed away in 05/12/23. Patient continues to have chronic depression anxiety and crying spells. She continues to have issues with her stepdaughter as she is living in the same home. Patient is still trying to resolve legal issues since the death of her husband. At times she complain of insomnia but she also not taking Neurontin on a schedule time. Overall she believe Neurontin and Cymbalta helping her. She reported no side effects of medication. She is scheduled to see primary care physician on March 15. She denies any agitation anger however complain of frustration when she deals with stepdaughter.  Mental status examination Patient is an elderly woman who is well groomed and casually dressed. She described her mood is anxious and depressed and her affect is constricted. She denies any active or passive suicidal thoughts or homicidal thoughts. She denies any auditory or visual hallucination. There no psychotic symptoms present. She maintained fair eye contact. Her speech is soft clear and coherent. She's alert and oriented x3. Her insight judgment and impulse control is okay.  Assessment Major depressive disorder  Plan I talked to the patient about her current symptoms I do believe patient need to restart counseling with a therapist. I recommended to see a psychologist in this office who she used to see before. For now we'll continue her Cymbalta 60 mg and Neurontin 600 mg at bedtime. She still has leftover refill and does not need a new prescription at this time. I explained risks and benefits of medication. I recommended to call us if she has any question or concern about the medication or if she feels worsening of the symptoms. I will see her again in 3 months

## 2011-10-20 ENCOUNTER — Ambulatory Visit (HOSPITAL_COMMUNITY): Payer: Medicare Other | Admitting: Psychology

## 2011-11-03 ENCOUNTER — Other Ambulatory Visit (HOSPITAL_COMMUNITY): Payer: Self-pay | Admitting: Psychiatry

## 2011-11-03 DIAGNOSIS — F331 Major depressive disorder, recurrent, moderate: Secondary | ICD-10-CM

## 2011-11-30 ENCOUNTER — Other Ambulatory Visit (HOSPITAL_COMMUNITY): Payer: Self-pay | Admitting: Internal Medicine

## 2011-11-30 DIAGNOSIS — Z139 Encounter for screening, unspecified: Secondary | ICD-10-CM

## 2011-12-06 ENCOUNTER — Other Ambulatory Visit (HOSPITAL_COMMUNITY): Payer: Self-pay | Admitting: Internal Medicine

## 2011-12-06 DIAGNOSIS — Z139 Encounter for screening, unspecified: Secondary | ICD-10-CM

## 2012-01-04 ENCOUNTER — Other Ambulatory Visit (HOSPITAL_COMMUNITY): Payer: Self-pay | Admitting: Internal Medicine

## 2012-01-04 ENCOUNTER — Ambulatory Visit (HOSPITAL_COMMUNITY)
Admission: RE | Admit: 2012-01-04 | Discharge: 2012-01-04 | Disposition: A | Discharge status: Home / Self Care | Payer: Medicare Other | Source: Ambulatory Visit | Attending: Internal Medicine | Admitting: Internal Medicine

## 2012-01-04 ENCOUNTER — Encounter (HOSPITAL_COMMUNITY): Payer: Self-pay

## 2012-01-04 DIAGNOSIS — N6039 Fibrosclerosis of unspecified breast: Secondary | ICD-10-CM | POA: Insufficient documentation

## 2012-01-04 DIAGNOSIS — Z139 Encounter for screening, unspecified: Secondary | ICD-10-CM

## 2012-01-04 DIAGNOSIS — Z853 Personal history of malignant neoplasm of breast: Secondary | ICD-10-CM | POA: Insufficient documentation

## 2012-01-04 NOTE — Progress Notes (Signed)
Patient tolerated procedure well, discharge instructions given.

## 2012-01-10 ENCOUNTER — Ambulatory Visit (HOSPITAL_COMMUNITY): Payer: Medicare Other | Admitting: Psychiatry

## 2012-01-12 ENCOUNTER — Other Ambulatory Visit (HOSPITAL_COMMUNITY): Payer: Self-pay | Admitting: Psychiatry

## 2012-01-12 DIAGNOSIS — F329 Major depressive disorder, single episode, unspecified: Secondary | ICD-10-CM

## 2012-01-17 ENCOUNTER — Ambulatory Visit (HOSPITAL_COMMUNITY): Payer: Medicare Other | Admitting: Psychiatry

## 2012-01-31 ENCOUNTER — Other Ambulatory Visit (HOSPITAL_COMMUNITY): Payer: Medicare Other

## 2012-02-03 ENCOUNTER — Other Ambulatory Visit (HOSPITAL_COMMUNITY): Payer: Self-pay | Admitting: *Deleted

## 2012-02-07 ENCOUNTER — Encounter (HOSPITAL_COMMUNITY): Payer: Self-pay | Admitting: Psychiatry

## 2012-02-07 ENCOUNTER — Ambulatory Visit (INDEPENDENT_AMBULATORY_CARE_PROVIDER_SITE_OTHER): Payer: Medicare Other | Admitting: Psychiatry

## 2012-02-07 DIAGNOSIS — F329 Major depressive disorder, single episode, unspecified: Secondary | ICD-10-CM

## 2012-02-07 NOTE — Progress Notes (Signed)
Chief complaint Medication management and followup.  History of presenting illness Patient is 78 year old widowed Caucasian female who came for her followup appointment.  She had missed her last appointment .  She told her brother died after losing battle with lung cancer.  She was busy in funeral and going through the grief process.  Overall patient is stable on her current psychiatric medication.  She is concerned about her head shake and nodding .  She has notice that other people were talking in funeral about her head shake .  She denies any fall , muscle stiffness or any memory impairment .  She denies any hand shakes or tremors.  She is scheduled to see her primary care physician for further workup.  Overall her mood has been stable and she denies any recent crying spells or agitation.  She generally side effects of medication.  Current psychiatric medication Neurontin 300 mg 2 at bedtime Cymbalta 60 mg daily  Past psychiatric history Patient has been seeing in this office since 2003.  She denies a history of previous suicidal attempt or any inpatient psychiatric treatment.  In the past she has taken Lexapro and Wellbutrin but did not had a good response with these medication.  Medical history She has recently change her primary care physician.  She was seeing [place] physician assistant however she was not happy and now she is scheduled to see Dr. Suzan Slick hall.  She has history of thyroid cancer, acid reflux and hypertension.  She takes Coumadin.  Psychosocial history Patient lives by herself.  Last year her husband died.  She has one daughter who lives close by.  Her daughter is concern about her and want her to live in assisted living.  Mental status examination Patient is an elderly woman who is well groomed and casually dressed. She described her mood is anxious and depressed and her affect is constricted. She denies any active or passive suicidal thoughts or homicidal thoughts. She denies  any auditory or visual hallucination. There no psychotic symptoms present. She maintained fair eye contact. Her speech is soft clear and coherent. She's alert and oriented x3. Her insight judgment and impulse control is okay.  Assessment Axis I Major depressive disorder Axis II deferred Axis III see medical history Axis IV mild to moderate  Plan I encourage her to keep appointment with her private care physician for further workup on her head nodding .  She may need a neurology workup .  I will continue her Neurontin and Cymbalta.  I explained risks and benefits of medication in detail.  I recommend to call us if she has any question or concern about the medication or if she feels worsening of the symptoms.  I will see her again in 3 months.

## 2012-02-16 ENCOUNTER — Telehealth (HOSPITAL_COMMUNITY): Payer: Self-pay | Admitting: *Deleted

## 2012-02-17 ENCOUNTER — Other Ambulatory Visit (HOSPITAL_COMMUNITY): Payer: Self-pay | Admitting: Psychiatry

## 2012-02-17 NOTE — Progress Notes (Signed)
Call return at [phone].  Patient was started on propranolol 10 mg every 6 hour for tremors.  Patient wants to know if she has any side effects of this medication.  I explained that she need to be careful about her blood pressure and dizziness.  I recommended if she start having the symptoms that she should call her primary care physician.

## 2012-02-21 ENCOUNTER — Other Ambulatory Visit (HOSPITAL_COMMUNITY): Payer: Medicare Other

## 2012-02-23 ENCOUNTER — Ambulatory Visit (HOSPITAL_COMMUNITY)
Admission: RE | Admit: 2012-02-23 | Discharge: 2012-02-23 | Disposition: A | Discharge status: Home / Self Care | Payer: Medicare Other | Source: Ambulatory Visit | Attending: Internal Medicine | Admitting: Internal Medicine

## 2012-02-23 DIAGNOSIS — Z139 Encounter for screening, unspecified: Secondary | ICD-10-CM

## 2012-02-23 DIAGNOSIS — M899 Disorder of bone, unspecified: Secondary | ICD-10-CM | POA: Insufficient documentation

## 2012-02-23 DIAGNOSIS — Z13820 Encounter for screening for osteoporosis: Secondary | ICD-10-CM | POA: Insufficient documentation

## 2012-04-10 ENCOUNTER — Other Ambulatory Visit (HOSPITAL_COMMUNITY): Payer: Self-pay | Admitting: Psychiatry

## 2012-04-10 DIAGNOSIS — F329 Major depressive disorder, single episode, unspecified: Secondary | ICD-10-CM

## 2012-05-08 ENCOUNTER — Ambulatory Visit (INDEPENDENT_AMBULATORY_CARE_PROVIDER_SITE_OTHER): Payer: Medicare Other | Admitting: Psychiatry

## 2012-05-08 ENCOUNTER — Encounter (HOSPITAL_COMMUNITY): Payer: Self-pay | Admitting: Psychiatry

## 2012-05-08 DIAGNOSIS — F329 Major depressive disorder, single episode, unspecified: Secondary | ICD-10-CM

## 2012-05-08 NOTE — Progress Notes (Signed)
Chief complaint I am in a lot of the stress.  I removed to [employer].    History of presenting illness Patient is 78 year old widowed Caucasian female who came for her followup appointment.  She endorse increase in her anxiety and distress due to recent move to a senior community Center in [place].  She has issues with her daughter off her deceased husband and there has been a issue on property and belonging.  She move in July 6 .  She does not want to change her medication .  She is in the process of getting new primary care physician and psychiatrist since she cannot travel from [place] for her appointment in [place].  She has recently seen Dr. Ethelene Browns lamb for her primary care needs.  Patient admitted recently poor sleep racing thoughts and getting sick due to distress with family matters.  She's been given multiple antibiotic .  She also fell again but luckily no injuries.  She is very upset at her situation because she does not want to leave the house of her deceased husband however his family do not like to be stayed there.  She is taking Inderal on and off which is actually helping her.  She's compliant with her psychiatric medication including Cymbalta and Neurontin.  She denies any side effects.  She's not drinking or using any illegal substance.  She denies any recent crying spells but endorse frustration and irritability.  Current psychiatric medication Neurontin 300 mg 2 at bedtime Cymbalta 60 mg daily  Past psychiatric history Patient has been seeing in this office since 2003.  She denies a history of previous suicidal attempt or any inpatient psychiatric treatment.  In the past she has taken Lexapro and Wellbutrin but did not had a good response with these medication.  Medical history She has history of thyroid cancer, acid reflux and hypertension.  She takes Coumadin.  Her primary care physician is Dr. Ethelene Browns lamb.  Psychosocial history Patient lives by  herself.  Last year her husband died.  She has one daughter who lives close by.  She is recently moved to senior community Center in [place].    Mental status examination Patient is an elderly woman who is well groomed and casually dressed. She described her mood is frustrated and her affect is mood appropriate.. She denies any active or passive suicidal thoughts or homicidal thoughts. She denies any auditory or visual hallucination. There no psychotic symptoms present. She maintained fair eye contact. Her speech is fast but clear and coherent.  There were no flight of ideas or loose association.  She's alert and oriented x3. Her insight judgment and impulse control is okay.  Assessment Axis I Major depressive disorder Axis II deferred Axis III see medical history Axis IV mild to moderate  Plan I encourage her to keep appointment with her  primary care physician and get referral to see psychiatrist in [place] area.  I discussed in detail about her psychosocial stressors and recommend to see therapist however patient declined.  Patient like to continue her current psychiatric medication which is helping her.  I recommend to call us if she make appointment with psychiatrist so we can transfer her records .  Time spent 30 minutes.  I recommend to call us if she is any question or concern or if she feels worsening of the symptoms.  I will see her again in 3 months however if she find a different psychiatrist in [place] she will call us.  She  has refill remaining and will call us if she needed.

## 2012-05-22 ENCOUNTER — Telehealth: Payer: Self-pay | Admitting: *Deleted

## 2012-05-22 NOTE — Telephone Encounter (Signed)
Error

## 2012-06-07 ENCOUNTER — Ambulatory Visit: Payer: Self-pay | Admitting: Unknown Physician Specialty

## 2012-06-07 LAB — PROTIME-INR
INR: 1
Prothrombin Time: 13.2 secs (ref 11.5–14.7)

## 2012-06-07 LAB — HM COLONOSCOPY

## 2012-06-28 ENCOUNTER — Ambulatory Visit: Payer: Self-pay | Admitting: Internal Medicine

## 2012-07-08 ENCOUNTER — Other Ambulatory Visit (HOSPITAL_COMMUNITY): Payer: Self-pay | Admitting: Psychiatry

## 2012-07-09 ENCOUNTER — Other Ambulatory Visit (HOSPITAL_COMMUNITY): Payer: Self-pay | Admitting: Psychiatry

## 2012-07-09 DIAGNOSIS — F329 Major depressive disorder, single episode, unspecified: Secondary | ICD-10-CM

## 2012-07-09 MED ORDER — DULOXETINE HCL 60 MG PO CPEP: 60.0000 mg | ORAL_CAPSULE | Freq: Every day | ORAL | Status: DC

## 2012-07-09 MED ORDER — GABAPENTIN 300 MG PO CAPS: 300.0000 mg | ORAL_CAPSULE | Freq: Two times a day (BID) | ORAL | Status: DC

## 2012-08-07 ENCOUNTER — Ambulatory Visit (HOSPITAL_COMMUNITY): Payer: Self-pay | Admitting: Psychiatry

## 2012-09-24 ENCOUNTER — Ambulatory Visit (HOSPITAL_COMMUNITY): Payer: Self-pay | Admitting: Psychiatry

## 2012-11-29 ENCOUNTER — Telehealth (HOSPITAL_COMMUNITY): Payer: Self-pay | Admitting: Oncology

## 2012-11-29 NOTE — Telephone Encounter (Signed)
Need rx for 2 bras fax to [facility]

## 2013-01-07 ENCOUNTER — Ambulatory Visit: Payer: Self-pay | Admitting: Internal Medicine

## 2013-01-09 ENCOUNTER — Ambulatory Visit: Payer: Self-pay | Admitting: Internal Medicine

## 2013-01-14 ENCOUNTER — Encounter: Payer: Self-pay | Admitting: *Deleted

## 2013-01-16 ENCOUNTER — Encounter: Payer: Self-pay | Admitting: General Surgery

## 2013-01-16 ENCOUNTER — Ambulatory Visit (INDEPENDENT_AMBULATORY_CARE_PROVIDER_SITE_OTHER): Payer: Medicare Other | Admitting: General Surgery

## 2013-01-16 VITALS — BP 122/84 | HR 68 | Resp 14 | Ht 65.0 in | Wt 181.0 lb

## 2013-01-16 DIAGNOSIS — R928 Other abnormal and inconclusive findings on diagnostic imaging of breast: Secondary | ICD-10-CM

## 2013-01-16 NOTE — Patient Instructions (Addendum)
This patient has been instructed to obtain records from [place].  Patient to have a bilateral breast MRI at the [facility]. This has been arranged for 01-20-13 at 3:30 pm. Wilkie Aye at the [facility] has informed patient of all instructions.

## 2013-01-16 NOTE — Progress Notes (Signed)
Patient ID: Kerri Mills, female   DOB: 03/03/1931, 79 y.o.   MRN: [id number]  Chief Complaint  Patient presents with  . Breast Problem    Category 4 Mammogram    HPI Kerri Mills is a 79 y.o. female who presents for follow up mammogram. The most recent mammogram was done January 07 2013.  Prior mammograms were in [place]. Patient with known history of left breast lumpectomy 2011 by [facility] in [place] with radiation. Developed an infection in the breast post radiation. This took 7 months to heal.  No new breast issues other than left breast tenderness when she bends over.  No family history of breast cancer. The patient was last here in 1999 which time a simple cyst was aspirated from the right breast..  HPI  Past Medical History  Diagnosis Date  . Hypertension   . Gastric ulcer   . Thyroid cancer   . Heart disease   . Breast CA   . GERD (gastroesophageal reflux disease)   . Hypercholesterolemia   . Seroma     HISTORY OF LFT BREAST  . Anxiety   . Infiltrating lobular carcinoma of left breast 03/19/2011  . Melanoma in situ of upper extremity 03/19/2011  . Thyroid cancer 03/19/2011  . Diverticulitis 2013  . Sleep apnea     Past Surgical History  Procedure Laterality Date  . Tonsillectomy    . Cholecystectomy    . Melanoma excision      RT UPPER ARM  . Thyroid surgery      FOR THYROID CANCER  . Partial hysterectomy    . Colonoscopy  2013  . Breast lumpectomy  2011    left breast    Family History  Problem Relation Age of Onset  . Heart disease Mother   . Cancer Brother     lung     Social History History  Substance Use Topics  . Smoking status: Never Smoker   . Smokeless tobacco: Not on file  . Alcohol Use: Yes     Comment: social drinking. average times a week    Allergies  Allergen Reactions  . Lipitor (Atorvastatin Calcium) Other (See Comments)    Stiffness & soreness  . Penicillins Rash    REACTION: Unknown reaction    Current Outpatient  Prescriptions  Medication Sig Dispense Refill  . anastrozole (ARIMIDEX) 1 MG tablet Take 1 mg by mouth daily.        . Cholecalciferol (VITAMIN D) 1000 UNITS capsule Take 1,000 Units by mouth daily.        . DULoxetine (CYMBALTA) 60 MG capsule Take 1 capsule (60 mg total) by mouth daily.  90 capsule  0  . gabapentin (NEURONTIN) 300 MG capsule Take 1 capsule (300 mg total) by mouth 2 (two) times daily.  180 capsule  0  . levothyroxine (SYNTHROID, LEVOTHROID) 100 MCG tablet Take 100 mcg by mouth daily.        Marland Kitchen losartan-hydrochlorothiazide (HYZAAR) 100-12.5 MG per tablet Take 1 tablet by mouth daily.        Marland Kitchen lovastatin (MEVACOR) 40 MG tablet Take 40 mg by mouth daily.        . Multiple Vitamins-Minerals (PRESERVISION AREDS 2 PO) Take 2 tablets by mouth daily.        Marland Kitchen omeprazole (PRILOSEC OTC) 20 MG tablet Take 20 mg by mouth daily.        . propranolol (INDERAL) 10 MG tablet Take 10 mg by mouth daily as needed.       Marland Kitchen  warfarin (COUMADIN) 4 MG tablet Take 4 mg by mouth daily. Taking 4.5 mg for 2 days each week       No current facility-administered medications for this visit.    Review of Systems Review of Systems  Constitutional: Negative.   Respiratory: Negative.   Cardiovascular: Negative.     Blood pressure 122/84, pulse 68, resp. rate 14, height 5\' 5"  (1.651 m), weight 181 lb (82.101 kg).  Physical Exam Physical Exam  Constitutional: She is oriented to person, place, and time. She appears well-developed and well-nourished.  Cardiovascular: Normal rate and regular rhythm.   Pulmonary/Chest: Effort normal and breath sounds normal. Right breast exhibits no inverted nipple, no mass, no nipple discharge, no skin change and no tenderness. Left breast exhibits no tenderness.  Deformity 3 o'clock  left breast  Neurological: She is alert and oriented to person, place, and time.  Skin: Skin is warm and dry.    Data Reviewed Bilateral mammograms in 01/07/2013 showed scattered  fibroglandular tissue. Focal asymmetry calcifications in the superior lateral left breast medial to the area of scarring, new from her 2013 exam. An adjacent biopsy clip is noted. Multiple subcentimeter masses associated with coarse calcifications. BI-RAD-0.  Focal spot compression views dated 01/09/2013 showed a small mass with irregular borders at the 12:00 position with associated biopsy clip. Masses were reported as new from post biopsy imaging April 2013. Focal spot compression imaging showed resolution of the asymmetric density.  Ultrasound showed a hypoechoic mass 7 cm from the nipple the 12:00 position measuring 0.6 x 0.7 x 1.2 cm. Areas of calcification identified. Multiple masses noted, also at the 11:00 position measuring 0.5 cm. BIRAD-4.    The patient underwent a left partial mastectomy and sentinel node biopsy on 12/16/2009. This was after a preoperative breast MRI showed the known invasive mammary carcinoma in the left lower outer quadrant of the breast as well as evidence of fat necrosis and oil cyst formation in the upper inner left breast.   Pathology showed no evidence of metastatic disease in one sentinel node removed. The partial mastectomy showed invasive mammary cancer consistent with invasive lobular carcinoma measuring 2.5 cm in diameter, histologic grade II/III. Negative surgical margins. ER 98%, PR 0%, Ki-67: 31%. HER-2/neu was not over ample side.    The patient's last office note is from December 2011 regarding the delayed healing thought secondary to radiation treatment. Details of the radiation administered were not included in the information packet provided by the patient.  Assessment    Infiltrating lobular carcinoma of the left breast, T2, N0.  Abnormal mammogram likely secondary to postsurgical/radiation complications with ongoing scarring.  Details regarding the subsequent biopsy of the left breast as evidenced by a clip in the 12:00 position are not available  at this time.       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  At effort will be made to fill in the gaps in regards the additional biopsies in the pathology of those lesions, but her breast MRIs requested to help sort out the multiple lesions identified on her recent mammogram.    Patient to have a bilateral breast MRI at [facility]. This has been arranged for 01-20-13 at 3:30 pm.   [name] 01/17/2013, 8:49 PM

## 2013-01-17 ENCOUNTER — Encounter: Payer: Self-pay | Admitting: General Surgery

## 2013-01-18 ENCOUNTER — Other Ambulatory Visit: Payer: Self-pay

## 2013-01-18 ENCOUNTER — Telehealth: Payer: Self-pay | Admitting: General Surgery

## 2013-01-18 DIAGNOSIS — R928 Other abnormal and inconclusive findings on diagnostic imaging of breast: Secondary | ICD-10-CM | POA: Insufficient documentation

## 2013-01-18 NOTE — Telephone Encounter (Signed)
The patient brought records of her 2011 breast biopsy, wide excision and subsequent delayed wound healing for review. Details of her radiation therapy were not included. Pathology showed a T2 infiltrating lobular carcinoma.  The patient reports that she has had no biopsies since her 2011 breast wide excision.  MRI is scheduled for April 27, and a followup appointment is arranged for later in May 2014 to review these films.  Based on the upcoming MRI further recommendations will be made for additional therapy.

## 2013-01-20 ENCOUNTER — Ambulatory Visit
Admission: RE | Admit: 2013-01-20 | Discharge: 2013-01-20 | Disposition: A | Discharge status: Home / Self Care | Payer: Medicare Other | Source: Ambulatory Visit | Attending: General Surgery | Admitting: General Surgery

## 2013-01-20 MED ORDER — GADOBENATE DIMEGLUMINE 529 MG/ML IV SOLN
17.0000 mL | Freq: Once | INTRAVENOUS | Status: AC | PRN
  Administered 2013-01-20: 17 mL via INTRAVENOUS

## 2013-01-23 ENCOUNTER — Telehealth: Payer: Self-pay | Admitting: *Deleted

## 2013-01-23 NOTE — Telephone Encounter (Signed)
Message copied by Currie Paris on Wed Jan 23, 2013  1:17 PM ------      Message from: [place], [place] W      Created: Wed Jan 23, 2013 11:43 AM       Mindi Junker: Please notify the patient that the MRI shows the right breast to be fine, and most of the problem areas in the left breast to be related to surgery. One spot will need to be biopsied at her next office visit (likely Encor).      Michelle: Add plans for Encor biopsy at time of next OV.       Thanks.  ------

## 2013-01-23 NOTE — Telephone Encounter (Signed)
Notified patient as instructed, patient agrees.  

## 2013-02-13 ENCOUNTER — Encounter: Payer: Self-pay | Admitting: General Surgery

## 2013-02-13 ENCOUNTER — Other Ambulatory Visit: Payer: Self-pay

## 2013-02-13 ENCOUNTER — Ambulatory Visit (INDEPENDENT_AMBULATORY_CARE_PROVIDER_SITE_OTHER): Payer: Medicare Other | Admitting: General Surgery

## 2013-02-13 VITALS — BP 110/62 | HR 80 | Resp 12 | Ht 65.0 in | Wt 185.0 lb

## 2013-02-13 DIAGNOSIS — N63 Unspecified lump in unspecified breast: Secondary | ICD-10-CM

## 2013-02-13 HISTORY — PX: BREAST BIOPSY: SHX20

## 2013-02-13 NOTE — Progress Notes (Signed)
Patient ID: Kerri Mills, female   DOB: 1931/06/30, 79 y.o.   MRN: [id number]  Chief Complaint  Patient presents with  . Other    breast biopsy    HPI Kerri Mills is a 79 y.o. female here today for follow up from an MRI that was done.  Plan for a left breast ENCORE biopsy. The patient reported that if any additional cancer was identified in the breast she desires to proceed with mastectomy. HPI  Past Medical History  Diagnosis Date  . Hypertension   . Gastric ulcer   . Thyroid cancer   . Heart disease   . Breast CA   . GERD (gastroesophageal reflux disease)   . Hypercholesterolemia   . Seroma     HISTORY OF LFT BREAST  . Anxiety   . Infiltrating lobular carcinoma of left breast 03/19/2011  . Melanoma in situ of upper extremity 03/19/2011  . Thyroid cancer 03/19/2011  . Diverticulitis 2013  . Sleep apnea     Past Surgical History  Procedure Laterality Date  . Tonsillectomy    . Cholecystectomy    . Melanoma excision      RT UPPER ARM  . Thyroid surgery      FOR THYROID CANCER  . Partial hysterectomy    . Colonoscopy  2013  . Breast lumpectomy  2011    left breast    Family History  Problem Relation Age of Onset  . Heart disease Mother   . Cancer Brother     lung     Social History History  Substance Use Topics  . Smoking status: Never Smoker   . Smokeless tobacco: Not on file  . Alcohol Use: Yes     Comment: social drinking. average times a week    Allergies  Allergen Reactions  . Lipitor (Atorvastatin Calcium) Other (See Comments)    Stiffness & soreness  . Penicillins Rash    REACTION: Unknown reaction    Current Outpatient Prescriptions  Medication Sig Dispense Refill  . anastrozole (ARIMIDEX) 1 MG tablet Take 1 mg by mouth daily.        . Cholecalciferol (VITAMIN D) 1000 UNITS capsule Take 1,000 Units by mouth daily.        . DULoxetine (CYMBALTA) 60 MG capsule Take 1 capsule (60 mg total) by mouth daily.  90 capsule  0  . gabapentin  (NEURONTIN) 300 MG capsule Take 1 capsule (300 mg total) by mouth 2 (two) times daily.  180 capsule  0  . levothyroxine (SYNTHROID, LEVOTHROID) 100 MCG tablet Take 100 mcg by mouth daily.        Marland Kitchen losartan-hydrochlorothiazide (HYZAAR) 100-12.5 MG per tablet Take 1 tablet by mouth daily.        Marland Kitchen lovastatin (MEVACOR) 40 MG tablet Take 40 mg by mouth daily.        . Multiple Vitamins-Minerals (PRESERVISION AREDS 2 PO) Take 2 tablets by mouth daily.        Marland Kitchen omeprazole (PRILOSEC OTC) 20 MG tablet Take 20 mg by mouth daily.        . propranolol (INDERAL) 10 MG tablet Take 10 mg by mouth daily as needed.       . warfarin (COUMADIN) 4 MG tablet Take 4 mg by mouth daily. Taking 4.5 mg for 2 days each week       No current facility-administered medications for this visit.    Review of Systems Review of Systems  Constitutional: Negative.   Respiratory: Negative.  Cardiovascular: Negative.     Blood pressure 110/62, pulse 80, resp. rate 12, height 5\' 5"  (1.651 m), weight 185 lb (83.915 kg).  Physical Exam Physical Exam  Examination of the breast showed focal thickening and mild tenderness in the area of palpable thickening of the o'clock position of left breast. Significant scarring from her postoperative stroma is evident in the 3:00 position of the left breast. No axillary adenopathy is noted.   Data Reviewed MRI of April 2014: IMPRESSION:  1. Right breast is negative.  2. Postoperative changes on the left.  3. Numerous foci of enhancement throughout the left breast,  consistent with fat necrosis.  4. Oval enhancing nodule within the upper central portion of the  left breast, corresponding to the developing density seen  mammographically. Although findings are favored to represent fat  necrosis, MR features are not diagnostic of fat necrosis.  Therefore, stereotactic or ultrasound guided core biopsy of this  region would be suggested to confirm benign diagnosis.  Ultrasound  examination identified a mass measuring 1 x 1 x 1 cm in the 12:00 position of the left breast 7 cm from the nipple. The patient was amenable to ultrasound-guided biopsy. 10 cc of 0.5% Xylocaine with 0.25% Marcaine with 1-200,000 units of epinephrine was injected under ultrasound guidance. This was well tolerated. Skin preparation with ChloraPrep was completed. A 10-gauge Encor device was advanced into the center of the lesion under ultrasound guidance. 12 core samples were obtained. Scant bleeding was noted. This was controlled with direct pressure for 5 minutes. The skin defect was closed with benzoin and Steri-Strips followed by Telfa Tegaderm dressing. The patient was given written instructions in regards to wound care.  Assessment    Left breast mass, abnormal mammogram/ultrasound; possible fat necrosis     Plan    The patient will be contacted when the biopsy results are available. She is aware that if an abnormal biopsy is obtained she'll be contacted by Dr. Evette Cristal in my absence.        Kerri Mills 02/13/2013, 6:43 PM   a

## 2013-02-13 NOTE — Patient Instructions (Addendum)

## 2013-02-15 ENCOUNTER — Telehealth: Payer: Self-pay | Admitting: *Deleted

## 2013-02-15 NOTE — Telephone Encounter (Signed)
Patient was contacted today at the request of Dr. Evette Cristal to let patient know that more than likely her pathology report is benign; however, we are still waiting to hear on the staining. She was made aware this is not the final pathology report and we will contact patient once final report is available.

## 2013-02-19 ENCOUNTER — Other Ambulatory Visit: Payer: Self-pay

## 2013-02-19 ENCOUNTER — Ambulatory Visit (INDEPENDENT_AMBULATORY_CARE_PROVIDER_SITE_OTHER): Payer: Medicare Other | Admitting: General Surgery

## 2013-02-19 ENCOUNTER — Encounter: Payer: Self-pay | Admitting: General Surgery

## 2013-02-19 DIAGNOSIS — S2002XA Contusion of left breast, initial encounter: Secondary | ICD-10-CM

## 2013-02-19 DIAGNOSIS — N63 Unspecified lump in unspecified breast: Secondary | ICD-10-CM

## 2013-02-19 NOTE — Patient Instructions (Addendum)
The patient is aware that a heating pad may be used for comfort as needed.  Awaiting final pathology. Prelim report suggests benign finding with crush effect.  Call for increased swelling Follow up Friday 02/22/13

## 2013-02-19 NOTE — Progress Notes (Signed)
Patient here today for follow up post left breast biopsy.  Dressing removed, steristrip in place and aware it may come off in one week.   Noted large amount of bruising and edema. Dr Evette Cristal to evaluate left breast.  8 ml aspirated from the area.  Patient aware to call for increased swelling or pain.  Aware to wear snug bra. Follow up with Dr Evette Cristal Friday.  Pt was evaluated at RN request. Large hematoma noted. US showed a > 3cm pocket of  thick fluid-old blood.  Aspiration yielded 8 ml of thick old blood.  Padded dressing placed and noted to be snug with the bra on.   Pt has been  on coumadin and this likely accounts for the hematoma. If it does not show improvement will need open drainage and possible cessation of coumadin for a short while. Recheck in 3 days or sooner prn.

## 2013-02-20 ENCOUNTER — Telehealth: Payer: Self-pay | Admitting: *Deleted

## 2013-02-20 LAB — PATHOLOGY

## 2013-02-20 NOTE — Telephone Encounter (Signed)
Notified patient as instructed, pathology benign per Dr Evette Cristal, patient pleased. Discussed follow-up appointments Friday, patient agrees.

## 2013-02-22 ENCOUNTER — Encounter: Payer: Self-pay | Admitting: General Surgery

## 2013-02-22 ENCOUNTER — Ambulatory Visit: Payer: Medicare Other | Admitting: General Surgery

## 2013-02-22 VITALS — BP 106/74 | HR 76 | Resp 12 | Ht 65.5 in | Wt 183.0 lb

## 2013-02-22 DIAGNOSIS — S2002XA Contusion of left breast, initial encounter: Secondary | ICD-10-CM

## 2013-02-22 NOTE — Progress Notes (Signed)
Patient ID: Kerri Mills, female   DOB: 10-25-1930, 79 y.o.   MRN: [id number]  Chief Complaint  Patient presents with  . Follow-up    left breast hematoma    HPI Kerri Mills is a 79 y.o. female who presents for a follow up visit post left breast encore biopsy. The procedure was performed on 02/13/13. The patient complains today of pain, fever, and hardness of the left breast. She admits to having increased headaches within the last week as well as occasional chills. She states the left breast feels "hard as a brick". She states she uses heat on the left breast as advised for approximately 8 hours or more a day. She feels that the left breast has improved slightly since her last office visit.   Patient here today for follow up post left breast biopsy.  Dressing removed, steristrip in place and aware it may come off in one week.   Noted large amount of bruising and edema. Dr Evette Cristal to evaluate left breast.  8 ml aspirated from the area.  Patient aware to call for increased swelling or pain.  Aware to wear snug bra. Follow up with Dr Evette Cristal Friday.  Pt was evaluated at RN request. Large hematoma noted. US showed a > 3cm pocket of  thick fluid-old blood.  Aspiration yielded 8 ml of thick old blood.  Padded dressing placed and noted to be snug with the bra on.   Pt has been  on coumadin and this likely accounts for the hematoma. If it does not show improvement will need open drainage and possible cessation of coumadin for a short while. Recheck in 3 days or sooner prn. HPI  Past Medical History  Diagnosis Date  . Hypertension   . Gastric ulcer   . Thyroid cancer   . Heart disease   . Breast CA   . GERD (gastroesophageal reflux disease)   . Hypercholesterolemia   . Seroma     HISTORY OF LFT BREAST  . Anxiety   . Infiltrating lobular carcinoma of left breast 03/19/2011  . Melanoma in situ of upper extremity 03/19/2011  . Thyroid cancer 03/19/2011  . Diverticulitis 2013  . Sleep apnea      Past Surgical History  Procedure Laterality Date  . Tonsillectomy    . Cholecystectomy    . Melanoma excision      RT UPPER ARM  . Thyroid surgery      FOR THYROID CANCER  . Partial hysterectomy    . Colonoscopy  2013  . Breast lumpectomy  2011    left breast    Family History  Problem Relation Age of Onset  . Heart disease Mother   . Cancer Brother     lung     Social History History  Substance Use Topics  . Smoking status: Never Smoker   . Smokeless tobacco: Not on file  . Alcohol Use: Yes     Comment: social drinking. average times a week    Allergies  Allergen Reactions  . Lipitor (Atorvastatin Calcium) Other (See Comments)    Stiffness & soreness  . Penicillins Rash    REACTION: Unknown reaction    Current Outpatient Prescriptions  Medication Sig Dispense Refill  . anastrozole (ARIMIDEX) 1 MG tablet Take 1 mg by mouth daily.        . Cholecalciferol (VITAMIN D) 1000 UNITS capsule Take 1,000 Units by mouth daily.        . DULoxetine (CYMBALTA) 60 MG capsule  Take 1 capsule (60 mg total) by mouth daily.  90 capsule  0  . gabapentin (NEURONTIN) 300 MG capsule Take 1 capsule (300 mg total) by mouth 2 (two) times daily.  180 capsule  0  . levothyroxine (SYNTHROID, LEVOTHROID) 100 MCG tablet Take 100 mcg by mouth daily.        Marland Kitchen losartan-hydrochlorothiazide (HYZAAR) 100-12.5 MG per tablet Take 1 tablet by mouth daily.        Marland Kitchen lovastatin (MEVACOR) 40 MG tablet Take 40 mg by mouth daily.        . Multiple Vitamins-Minerals (PRESERVISION AREDS 2 PO) Take 2 tablets by mouth daily.        Marland Kitchen omeprazole (PRILOSEC OTC) 20 MG tablet Take 20 mg by mouth daily.        . propranolol (INDERAL) 10 MG tablet Take 10 mg by mouth daily as needed.       . warfarin (COUMADIN) 4 MG tablet Take 4 mg by mouth daily. Taking 4.5 mg for 2 days each week       No current facility-administered medications for this visit.    Review of Systems Review of Systems  Constitutional:  Positive for chills.  Respiratory: Negative.   Cardiovascular: Negative.     Blood pressure 106/74, pulse 76, resp. rate 12, height 5' 5.5" (1.664 m), weight 183 lb (83.008 kg).  Physical Exam Physical Exam  Constitutional: She appears well-developed and well-nourished.  Hematoma of left breast seems to be stable in size. Approximately 5-6 cm. No signs of infection.   Data Reviewed Nil  Assessment    Left breast hematoma stable.      Plan  Follow up in 7-10 days. Continue with tight fitting bra.        Ivry Pigue G 02/22/2013, 11:59 AM

## 2013-02-22 NOTE — Patient Instructions (Addendum)
Patient advised to use heat 15 minutes every hour. Patient to return in 7-10 days. Patient to call if symptoms worsen or fail to improve.

## 2013-02-28 ENCOUNTER — Encounter: Payer: Self-pay | Admitting: General Surgery

## 2013-02-28 ENCOUNTER — Ambulatory Visit: Payer: Medicare Other | Admitting: General Surgery

## 2013-02-28 VITALS — BP 118/68 | HR 88 | Resp 14 | Ht 65.0 in | Wt 185.0 lb

## 2013-02-28 DIAGNOSIS — S2002XA Contusion of left breast, initial encounter: Secondary | ICD-10-CM

## 2013-02-28 NOTE — Progress Notes (Signed)
Patient ID: Kerri Mills, female   DOB: [birth year], 79 y.o.   MRN: [id number]  Chief Complaint  Patient presents with  . Other    post op left breast biopsy    HPI Kerri Mills is a 79 y.o. female who presents for a follow up visit post left breast encore biopsy. The procedure was performed on 02/13/13. Since left breast biopsy patient has developed a hematoma of the left breast and had  8 ml aspirated from the area by Dr Evette Cristal on 02-19-13. Overall, she says the breast does " feel better" and has been using heat.  She comes in today for bleeding from the left breast aspiration site that started Wednesday evening.  HPI  Past Medical History  Diagnosis Date  . Hypertension   . Gastric ulcer   . Thyroid cancer   . Heart disease   . Breast CA   . GERD (gastroesophageal reflux disease)   . Hypercholesterolemia   . Seroma     HISTORY OF LFT BREAST  . Anxiety   . Infiltrating lobular carcinoma of left breast 03/19/2011  . Melanoma in situ of upper extremity 03/19/2011  . Thyroid cancer 03/19/2011  . Diverticulitis 2013  . Sleep apnea     Past Surgical History  Procedure Laterality Date  . Tonsillectomy    . Cholecystectomy    . Melanoma excision      RT UPPER ARM  . Thyroid surgery      FOR THYROID CANCER  . Partial hysterectomy    . Colonoscopy  2013  . Breast lumpectomy  2011    left breast    Family History  Problem Relation Age of Onset  . Heart disease Mother   . Cancer Brother     lung     Social History History  Substance Use Topics  . Smoking status: Never Smoker   . Smokeless tobacco: Not on file  . Alcohol Use: Yes     Comment: social drinking. average times a week    Allergies  Allergen Reactions  . Lipitor (Atorvastatin Calcium) Other (See Comments)    Stiffness & soreness  . Penicillins Rash    REACTION: Unknown reaction    Current Outpatient Prescriptions  Medication Sig Dispense Refill  . anastrozole (ARIMIDEX) 1 MG tablet Take 1 mg by mouth  daily.        . Cholecalciferol (VITAMIN D) 1000 UNITS capsule Take 1,000 Units by mouth daily.        . DULoxetine (CYMBALTA) 60 MG capsule Take 1 capsule (60 mg total) by mouth daily.  90 capsule  0  . gabapentin (NEURONTIN) 300 MG capsule Take 1 capsule (300 mg total) by mouth 2 (two) times daily.  180 capsule  0  . levothyroxine (SYNTHROID, LEVOTHROID) 100 MCG tablet Take 100 mcg by mouth daily.        Marland Kitchen losartan-hydrochlorothiazide (HYZAAR) 100-12.5 MG per tablet Take 1 tablet by mouth daily.        Marland Kitchen lovastatin (MEVACOR) 40 MG tablet Take 40 mg by mouth daily.        . Multiple Vitamins-Minerals (PRESERVISION AREDS 2 PO) Take 2 tablets by mouth daily.        Marland Kitchen omeprazole (PRILOSEC OTC) 20 MG tablet Take 20 mg by mouth daily.        . propranolol (INDERAL) 10 MG tablet Take 10 mg by mouth daily as needed.       . warfarin (COUMADIN) 4 MG tablet Take  4 mg by mouth daily. Taking 4.5 mg for 2 days each week       No current facility-administered medications for this visit.    Review of Systems Review of Systems  Blood pressure 118/68, pulse 88, resp. rate 14, height 5\' 5"  (1.651 m), weight 185 lb (83.915 kg).  Physical Exam Physical Exam The left breast shows significant decrease in volume and ecchymosis from her evaluation last week. There is ill-defined fullness about 4-5 cm in diameter in the 12:00 position at the site of the previous biopsy. There is no air edema or warmth. The site of the previous aspiration and yielding a small amount of thin brown fluid suggestive of a resolving hematoma.  Data Reviewed Ultrasound examination of the breast showed a 3 x 3 cm area thought to represent either a hematoma or liquefied hematoma. With verbal consent the area was cleansed with ChloraPrep and 1 cc of 1% plain Xylocaine was used. Aspiration yielded a small quantity of thick fluid. The patient was amenable to incision and drainage. 10 cc of 0.5% Xylocaine with 0.25% Marcaine with 1-200,000  units of epinephrine was utilized well tolerated. A 6 mm incision was made at the site of the original aspiration from last week. Using gentle blunt dissection the cavity was entered and some old clot extracted. The cavity was irrigated with saline followed by peroxide followed by saline. The skin defect was closed with 4-0 nylon sutures x2. A dry dressing with Telfa and Tegaderm was applied.   Assessment    Hematoma post breast biopsy.    Plan    The patient will continue to use local heat. She may shower starting tomorrow. She was advised that she spilled Mace to use a small amount of drainage from the hematoma I&D site. We'll plan for a follow up examination in 03/05/2013.  The patient is scheduled for a repeat protime INR tomorrow. She was asked to have a copy of the results forwarded to this office.       Kerri Mills 02/28/2013, 1:43 PM

## 2013-03-01 LAB — PROTIME-INR: INR: 3.7 — AB (ref 0.9–1.1)

## 2013-03-04 ENCOUNTER — Ambulatory Visit: Payer: Medicare Other | Admitting: General Surgery

## 2013-03-05 ENCOUNTER — Encounter: Payer: Self-pay | Admitting: General Surgery

## 2013-03-05 ENCOUNTER — Ambulatory Visit: Payer: Medicare Other | Admitting: General Surgery

## 2013-03-05 VITALS — BP 124/70 | HR 78 | Resp 16 | Ht 65.0 in | Wt 186.0 lb

## 2013-03-05 DIAGNOSIS — S2002XA Contusion of left breast, initial encounter: Secondary | ICD-10-CM

## 2013-03-05 NOTE — Patient Instructions (Addendum)
Patient to return 3 weeks.  

## 2013-03-05 NOTE — Progress Notes (Signed)
Patient ID: Kerri Mills, female   DOB: 1931/03/21, 79 y.o.   MRN: [id number]  Chief Complaint  Patient presents with  . Other    hematoma     HPI Kerri Mills is a 79 y.o. female here today following up from an left breast hematoma.Patient states is itching a lot and still feels a knot. HPI  Past Medical History  Diagnosis Date  . Hypertension   . Gastric ulcer   . Thyroid cancer   . Heart disease   . Breast CA   . GERD (gastroesophageal reflux disease)   . Hypercholesterolemia   . Seroma     HISTORY OF LFT BREAST  . Anxiety   . Infiltrating lobular carcinoma of left breast 03/19/2011  . Melanoma in situ of upper extremity 03/19/2011  . Thyroid cancer 03/19/2011  . Diverticulitis 2013  . Sleep apnea     Past Surgical History  Procedure Laterality Date  . Tonsillectomy    . Cholecystectomy    . Melanoma excision      RT UPPER ARM  . Thyroid surgery      FOR THYROID CANCER  . Partial hysterectomy    . Colonoscopy  2013  . Breast lumpectomy  2011    left breast    Family History  Problem Relation Age of Onset  . Heart disease Mother   . Cancer Brother     lung     Social History History  Substance Use Topics  . Smoking status: Never Smoker   . Smokeless tobacco: Not on file  . Alcohol Use: Yes     Comment: social drinking. average times a week    Allergies  Allergen Reactions  . Lipitor (Atorvastatin Calcium) Other (See Comments)    Stiffness & soreness  . Penicillins Rash    REACTION: Unknown reaction    Current Outpatient Prescriptions  Medication Sig Dispense Refill  . anastrozole (ARIMIDEX) 1 MG tablet Take 1 mg by mouth daily.        . Cholecalciferol (VITAMIN D) 1000 UNITS capsule Take 1,000 Units by mouth daily.        . DULoxetine (CYMBALTA) 60 MG capsule Take 1 capsule (60 mg total) by mouth daily.  90 capsule  0  . gabapentin (NEURONTIN) 300 MG capsule Take 1 capsule (300 mg total) by mouth 2 (two) times daily.  180 capsule  0  .  levothyroxine (SYNTHROID, LEVOTHROID) 100 MCG tablet Take 100 mcg by mouth daily.        Marland Kitchen losartan-hydrochlorothiazide (HYZAAR) 100-12.5 MG per tablet Take 1 tablet by mouth daily.        Marland Kitchen lovastatin (MEVACOR) 40 MG tablet Take 40 mg by mouth daily.        . Multiple Vitamins-Minerals (PRESERVISION AREDS 2 PO) Take 2 tablets by mouth daily.        Marland Kitchen omeprazole (PRILOSEC OTC) 20 MG tablet Take 20 mg by mouth daily.        . propranolol (INDERAL) 10 MG tablet Take 10 mg by mouth daily as needed.       . warfarin (COUMADIN) 4 MG tablet Take 4 mg by mouth daily. Taking 4.5 mg for 2 days each week       No current facility-administered medications for this visit.    Review of Systems Review of Systems  Constitutional: Negative.   Respiratory: Negative.   Cardiovascular: Negative.     Blood pressure 124/70, pulse 78, resp. rate 16, height 5\' 5"  (  1.651 m), weight 186 lb (84.369 kg).  Physical Exam Physical Exam Examination shows decreasing bruising but a 3 cm fullness in the medial aspect of the left breast at the site of the biopsy. No warmth or erythema. Minimal tenderness. Data Reviewed The patient's pro time on Feb 22, 2013 was elevated at 3.8. I spoke with her primary care provider and reported his last assessment on March 01, 2013 was 3.7. He instructed her to make use of 4 mg alternating with 3 mg. No days without treatment were requested in spite of a significantly elevated pro time. The patient has been taking medication as instructed.  Assessment    Hematoma status post biopsy. Otherwise doing well. Elevated ProTime INR.     Plan    We'll plan for a follow up examination in 3 weeks.        [name] 03/05/2013, 9:54 PM

## 2013-03-08 ENCOUNTER — Ambulatory Visit: Payer: Medicare Other | Admitting: General Surgery

## 2013-03-14 LAB — PROTIME-INR: INR: 3.3 — AB (ref 0.9–1.1)

## 2013-03-27 ENCOUNTER — Ambulatory Visit: Payer: Medicare Other | Admitting: General Surgery

## 2013-03-27 ENCOUNTER — Encounter: Payer: Self-pay | Admitting: General Surgery

## 2013-03-27 VITALS — BP 118/62 | HR 68 | Resp 12 | Ht 65.0 in | Wt 185.0 lb

## 2013-03-27 DIAGNOSIS — S2002XA Contusion of left breast, initial encounter: Secondary | ICD-10-CM

## 2013-03-27 NOTE — Patient Instructions (Addendum)
Continue self breast exams. Call office for any new breast issues or concerns. Oct with left mammogram and office visit

## 2013-03-27 NOTE — Progress Notes (Signed)
Patient ID: Kerri Mills, female   DOB: 01/05/1931, 79 y.o.   MRN: [id number]  Chief Complaint  Patient presents with  . Follow-up    HPI Kerri Mills is a 79 y.o. female.  Patient here today for follow up from left breast hematoma that developed from a left breast biopsy done 02-13-13.  The area still has a knot but is getting softer. HPI  Past Medical History  Diagnosis Date  . Hypertension   . Gastric ulcer   . Thyroid cancer   . Heart disease   . Breast CA   . GERD (gastroesophageal reflux disease)   . Hypercholesterolemia   . Seroma     HISTORY OF LFT BREAST  . Anxiety   . Infiltrating lobular carcinoma of left breast 03/19/2011  . Melanoma in situ of upper extremity 03/19/2011  . Thyroid cancer 03/19/2011  . Diverticulitis 2013  . Sleep apnea     Past Surgical History  Procedure Laterality Date  . Tonsillectomy    . Cholecystectomy    . Melanoma excision      RT UPPER ARM  . Thyroid surgery      FOR THYROID CANCER  . Partial hysterectomy    . Colonoscopy  2013  . Breast lumpectomy  2011    left breast    Family History  Problem Relation Age of Onset  . Heart disease Mother   . Cancer Brother     lung     Social History History  Substance Use Topics  . Smoking status: Never Smoker   . Smokeless tobacco: Not on file  . Alcohol Use: Yes     Comment: social drinking. average times a week    Allergies  Allergen Reactions  . Lipitor (Atorvastatin Calcium) Other (See Comments)    Stiffness & soreness  . Penicillins Rash    REACTION: Unknown reaction    Current Outpatient Prescriptions  Medication Sig Dispense Refill  . anastrozole (ARIMIDEX) 1 MG tablet Take 1 mg by mouth daily.        . Cholecalciferol (VITAMIN D) 1000 UNITS capsule Take 1,000 Units by mouth daily.        . DULoxetine (CYMBALTA) 60 MG capsule Take 1 capsule (60 mg total) by mouth daily.  90 capsule  0  . gabapentin (NEURONTIN) 300 MG capsule Take 1 capsule (300 mg total) by mouth 2  (two) times daily.  180 capsule  0  . levothyroxine (SYNTHROID, LEVOTHROID) 100 MCG tablet Take 100 mcg by mouth daily.        Marland Kitchen losartan-hydrochlorothiazide (HYZAAR) 100-12.5 MG per tablet Take 1 tablet by mouth daily.        Marland Kitchen lovastatin (MEVACOR) 40 MG tablet Take 40 mg by mouth daily.        . Multiple Vitamins-Minerals (PRESERVISION AREDS 2 PO) Take 2 tablets by mouth daily.        Marland Kitchen omeprazole (PRILOSEC OTC) 20 MG tablet Take 20 mg by mouth daily.        . propranolol (INDERAL) 10 MG tablet Take 10 mg by mouth daily as needed.       . warfarin (COUMADIN) 4 MG tablet Take 4 mg by mouth daily. Alternating 3 mg and 4 mg       No current facility-administered medications for this visit.    Review of Systems Review of Systems  Constitutional: Negative.   Respiratory: Positive for cough.   Cardiovascular: Negative.     Blood pressure 118/62, pulse  68, resp. rate 12, height 5\' 5"  (1.651 m), weight 185 lb (83.915 kg).  Physical Exam Physical Exam  Constitutional: She is oriented to person, place, and time. She appears well-developed and well-nourished.  Neurological: She is alert and oriented to person, place, and time.  Skin: Skin is warm and dry.   Hematoma improving in left breast. No residual ecchymosis.  Modest fullness, improved from last visit.  Data Reviewed None   Assessment    Resolving left breast hematoma postbiopsy.    Plan    Will arrange for a left breast mammogram and office visit in October 2014.         [name] 03/27/2013, 2:07 PM

## 2013-04-23 LAB — PROTIME-INR: INR: 3.6 — AB (ref 0.9–1.1)

## 2013-04-27 ENCOUNTER — Telehealth: Payer: Self-pay | Admitting: General Surgery

## 2013-04-27 NOTE — Telephone Encounter (Signed)
Message copied by [name] on Sat Apr 27, 2013 10:36 AM ------      Message from: Erich Montane      Created: Thu Apr 25, 2013  9:57 AM      Regarding: Patient       Contact: [phone]       Pt called and was wanting to see if she can get a prescription for new prothesis and bras. She said if so she would like for it to be called in at [facility] in [place]. Let me know and I will call her back. Thanks! ------

## 2013-04-27 NOTE — Telephone Encounter (Signed)
And has been making use of a partial left breast prosthesis and surgical pause since her 2011 treatment. A new prosthesis and surgical pause has been requested. A fax request will be forwarded to [facility] in [place], [place].

## 2013-06-11 ENCOUNTER — Encounter: Payer: Self-pay | Admitting: Internal Medicine

## 2013-06-11 ENCOUNTER — Ambulatory Visit (INDEPENDENT_AMBULATORY_CARE_PROVIDER_SITE_OTHER): Payer: Medicare Other | Admitting: Internal Medicine

## 2013-06-11 VITALS — BP 120/80 | HR 88 | Temp 98.3°F | Ht 65.0 in | Wt 185.5 lb

## 2013-06-11 DIAGNOSIS — D0360 Melanoma in situ of unspecified upper limb, including shoulder: Secondary | ICD-10-CM

## 2013-06-11 DIAGNOSIS — C73 Malignant neoplasm of thyroid gland: Secondary | ICD-10-CM

## 2013-06-11 DIAGNOSIS — Z733 Stress, not elsewhere classified: Secondary | ICD-10-CM

## 2013-06-11 DIAGNOSIS — C50919 Malignant neoplasm of unspecified site of unspecified female breast: Secondary | ICD-10-CM

## 2013-06-11 DIAGNOSIS — Z23 Encounter for immunization: Secondary | ICD-10-CM

## 2013-06-11 DIAGNOSIS — C4360 Malignant melanoma of unspecified upper limb, including shoulder: Secondary | ICD-10-CM

## 2013-06-11 DIAGNOSIS — I4891 Unspecified atrial fibrillation: Secondary | ICD-10-CM

## 2013-06-11 DIAGNOSIS — F439 Reaction to severe stress, unspecified: Secondary | ICD-10-CM

## 2013-06-11 DIAGNOSIS — K219 Gastro-esophageal reflux disease without esophagitis: Secondary | ICD-10-CM

## 2013-06-11 DIAGNOSIS — G4733 Obstructive sleep apnea (adult) (pediatric): Secondary | ICD-10-CM

## 2013-06-15 ENCOUNTER — Encounter: Payer: Self-pay | Admitting: Internal Medicine

## 2013-06-15 DIAGNOSIS — G4733 Obstructive sleep apnea (adult) (pediatric): Secondary | ICD-10-CM | POA: Insufficient documentation

## 2013-06-15 DIAGNOSIS — F439 Reaction to severe stress, unspecified: Secondary | ICD-10-CM | POA: Insufficient documentation

## 2013-06-15 DIAGNOSIS — I4891 Unspecified atrial fibrillation: Secondary | ICD-10-CM | POA: Insufficient documentation

## 2013-06-15 NOTE — Assessment & Plan Note (Signed)
Reflux controlled.  On omeprazole.  Follow.   

## 2013-06-15 NOTE — Progress Notes (Signed)
Subjective:    Patient ID: Kerri Mills, female    DOB: [birth year], 79 y.o.   MRN: [id number]  HPI 79 year old female with past history of thyroid cancer, breast cancer, GERD, atrial fibrillation, hypertension, hypercholesterolemia and sleep apnea.  Moved to [place] in 7/13.  Was seeing Dr Fidela Juneau.  Reports increased stress.  On Cymbalta and Neurontin.  Has been seeing psychiatrist/counselor.  Feels things are stable now.  Has a history of atrial fib.  On coumadin.  Overdue pt/inr check.  Apparently seeing Dr Lemar Livings.  With history of breast cancer.  Apparently had a "breast clot" and infection after treatment.  States due f/u mammogram 10/14.  Is scheduled.  She also has a history of melanoma.  Seeing Dr Adolphus Birchwood.  Overall she feels she is doing relatively well.  Staying active.  No increased heart rate or palpitations.  No sob.  Acid reflux controlled.  Bowels stable.    Past Medical History  Diagnosis Date  . Hypertension   . Gastric ulcer   . Heart disease   . Breast CA   . GERD (gastroesophageal reflux disease)   . Hypercholesterolemia   . Seroma     HISTORY OF LFT BREAST  . Anxiety   . Infiltrating lobular carcinoma of left breast 03/19/2011  . Melanoma in situ of upper extremity 03/19/2011  . Thyroid cancer 03/19/2011  . Diverticulitis 2013  . Sleep apnea   . Arthritis   . Depression   . Allergy   . Colitis     Current Outpatient Prescriptions on File Prior to Visit  Medication Sig Dispense Refill  . anastrozole (ARIMIDEX) 1 MG tablet Take 1 mg by mouth daily.        . Cholecalciferol (VITAMIN D) 1000 UNITS capsule Take 1,000 Units by mouth daily.        . DULoxetine (CYMBALTA) 60 MG capsule Take 1 capsule (60 mg total) by mouth daily.  90 capsule  0  . gabapentin (NEURONTIN) 300 MG capsule Take 1 capsule (300 mg total) by mouth 2 (two) times daily.  180 capsule  0  . levothyroxine (SYNTHROID, LEVOTHROID) 100 MCG tablet Take 100 mcg by mouth daily.        Marland Kitchen  losartan-hydrochlorothiazide (HYZAAR) 100-12.5 MG per tablet Take 1 tablet by mouth daily.        Marland Kitchen lovastatin (MEVACOR) 40 MG tablet Take 40 mg by mouth daily.        . Multiple Vitamins-Minerals (PRESERVISION AREDS 2 PO) Take 2 tablets by mouth daily.        Marland Kitchen omeprazole (PRILOSEC OTC) 20 MG tablet Take 20 mg by mouth daily.        . propranolol (INDERAL) 10 MG tablet Take 10 mg by mouth daily as needed.        No current facility-administered medications on file prior to visit.    Review of Systems Patient denies any headache, lightheadedness or dizziness.  No chest pain, tightness or palpitations.  No increased shortness of breath, cough or congestion.  No nausea or vomiting.  Acid reflux controlled on omeprazole.  No abdominal pain or cramping.  No bowel change, such as diarrhea, constipation, BRBPR or melana.  No urine change.  Previous increased stress.  Stable now.       Objective:   Physical Exam Filed Vitals:   06/11/13 1439  BP: 120/80  Pulse: 88  Temp: 98.3 F (75.69 C)   79 year old female in no acute distress.  HEENT:  Nares- clear.  Oropharynx - without lesions. NECK:  Supple.  Nontender.  No audible bruit.  HEART:  Appears to be regular. LUNGS:  No crackles or wheezing audible.  Respirations even and unlabored.  RADIAL PULSE:  Equal bilaterally.  ABDOMEN:  Soft, nontender.  Bowel sounds present and normal.  No audible abdominal bruit.    EXTREMITIES:  No increased edema present.  DP pulses palpable and equal bilaterally.          Assessment & Plan:  HEALTH MAINTENANCE.  Review outside records.  Per her report, up to date with mammograms.  Due f/u in 10/14.  Colonoscopy 2013.  Obtain records.    I spent 45 minutes with the patient and more than 50% of the time was spent in consultation regarding the above.

## 2013-06-15 NOTE — Assessment & Plan Note (Signed)
On coumadin.  Follow. Overdue pt/inr.  Check today.

## 2013-06-15 NOTE — Assessment & Plan Note (Signed)
Doing better now.  On cymbalta.  Has seen psychiattry.  Follow.    

## 2013-06-15 NOTE — Assessment & Plan Note (Signed)
Currently doing well.  Followed by Dr Lemar Livings.  Up to date with mammograms.  Due f/u mammogram 10/14.

## 2013-06-15 NOTE — Assessment & Plan Note (Signed)
Followed by Dr Dasher.   

## 2013-06-15 NOTE — Assessment & Plan Note (Signed)
Using CPAP.  Needs nasal piece.  Will notify [facility] to send me the information regarding what equipment is needed.

## 2013-06-15 NOTE — Assessment & Plan Note (Signed)
Check thyroid function tests.  Obtain records.

## 2013-06-17 ENCOUNTER — Telehealth: Payer: Self-pay | Admitting: Internal Medicine

## 2013-06-17 DIAGNOSIS — I4891 Unspecified atrial fibrillation: Secondary | ICD-10-CM

## 2013-06-17 DIAGNOSIS — C73 Malignant neoplasm of thyroid gland: Secondary | ICD-10-CM

## 2013-06-17 DIAGNOSIS — R7401 Elevation of levels of liver transaminase levels: Secondary | ICD-10-CM

## 2013-06-17 DIAGNOSIS — E7800 Pure hypercholesterolemia, unspecified: Secondary | ICD-10-CM

## 2013-06-17 NOTE — Telephone Encounter (Signed)
Message copied by Charm Barges on Mon Jun 17, 2013  1:01 PM ------      Message from: Warden Fillers      Created: Mon Jun 17, 2013  9:36 AM       Pt notified of results & lab appt scheduled (Will need lab orders placed for the fasting labs) ------

## 2013-06-17 NOTE — Telephone Encounter (Signed)
Labs ordered.

## 2013-07-15 ENCOUNTER — Other Ambulatory Visit (INDEPENDENT_AMBULATORY_CARE_PROVIDER_SITE_OTHER): Payer: Medicare Other

## 2013-07-15 DIAGNOSIS — R7401 Elevation of levels of liver transaminase levels: Secondary | ICD-10-CM

## 2013-07-15 DIAGNOSIS — E7800 Pure hypercholesterolemia, unspecified: Secondary | ICD-10-CM

## 2013-07-15 DIAGNOSIS — C73 Malignant neoplasm of thyroid gland: Secondary | ICD-10-CM

## 2013-07-15 DIAGNOSIS — R7402 Elevation of levels of lactic acid dehydrogenase (LDH): Secondary | ICD-10-CM

## 2013-07-15 DIAGNOSIS — I4891 Unspecified atrial fibrillation: Secondary | ICD-10-CM

## 2013-07-15 LAB — CBC WITH DIFFERENTIAL/PLATELET
Basophils Absolute: 0 10*3/uL (ref 0.0–0.1)
Eosinophils Relative: 2.1 % (ref 0.0–5.0)
HCT: 36.1 % (ref 36.0–46.0)
Lymphocytes Relative: 23 % (ref 12.0–46.0)
Lymphs Abs: 1 10*3/uL (ref 0.7–4.0)
MCV: 89.7 fl (ref 78.0–100.0)
Monocytes Relative: 7.5 % (ref 3.0–12.0)
Platelets: 222 10*3/uL (ref 150.0–400.0)
RDW: 13 % (ref 11.5–14.6)
WBC: 4.5 10*3/uL (ref 4.5–10.5)

## 2013-07-15 LAB — LIPID PANEL
Cholesterol: 174 mg/dL (ref 0–200)
HDL: 38.6 mg/dL — ABNORMAL LOW (ref >39.00)
LDL Cholesterol: 99 mg/dL (ref 0–99)
Total CHOL/HDL Ratio: 5
Triglycerides: 182 mg/dL — ABNORMAL HIGH (ref 0.0–149.0)
VLDL: 36.4 mg/dL (ref 0.0–40.0)

## 2013-07-15 LAB — BASIC METABOLIC PANEL
CO2: 31 mEq/L (ref 19–32)
Chloride: 105 mEq/L (ref 96–112)
Glucose, Bld: 120 mg/dL — ABNORMAL HIGH (ref 70–99)
Potassium: 4.3 mEq/L (ref 3.5–5.1)
Sodium: 140 mEq/L (ref 135–145)

## 2013-07-15 LAB — HEPATIC FUNCTION PANEL
AST: 15 U/L (ref 0–37)
Albumin: 3.7 g/dL (ref 3.5–5.2)
Total Bilirubin: 0.7 mg/dL (ref 0.3–1.2)
Total Protein: 6.5 g/dL (ref 6.0–8.3)

## 2013-07-15 LAB — PROTIME-INR: Prothrombin Time: 28.6 s — ABNORMAL HIGH (ref 10.2–12.4)

## 2013-07-15 LAB — TSH: TSH: 0.22 u[IU]/mL — ABNORMAL LOW (ref 0.35–5.50)

## 2013-07-16 ENCOUNTER — Encounter: Payer: Self-pay | Admitting: General Surgery

## 2013-07-16 ENCOUNTER — Ambulatory Visit: Payer: Self-pay | Admitting: General Surgery

## 2013-07-16 ENCOUNTER — Other Ambulatory Visit: Payer: Self-pay | Admitting: Internal Medicine

## 2013-07-16 ENCOUNTER — Other Ambulatory Visit: Payer: Self-pay | Admitting: *Deleted

## 2013-07-16 DIAGNOSIS — E039 Hypothyroidism, unspecified: Secondary | ICD-10-CM

## 2013-07-16 DIAGNOSIS — R739 Hyperglycemia, unspecified: Secondary | ICD-10-CM

## 2013-07-16 DIAGNOSIS — Z7901 Long term (current) use of anticoagulants: Secondary | ICD-10-CM

## 2013-07-16 MED ORDER — LEVOTHYROXINE SODIUM 88 MCG PO TABS: 88.0000 ug | ORAL_TABLET | Freq: Every day | ORAL | Status: DC

## 2013-07-16 NOTE — Progress Notes (Signed)
Order placed for f/u labs.  

## 2013-07-23 ENCOUNTER — Encounter: Payer: Self-pay | Admitting: General Surgery

## 2013-07-23 ENCOUNTER — Ambulatory Visit (INDEPENDENT_AMBULATORY_CARE_PROVIDER_SITE_OTHER): Payer: Medicare Other | Admitting: General Surgery

## 2013-07-23 VITALS — BP 118/80 | HR 74 | Resp 16 | Ht 66.0 in | Wt 184.0 lb

## 2013-07-23 DIAGNOSIS — N63 Unspecified lump in unspecified breast: Secondary | ICD-10-CM

## 2013-07-23 DIAGNOSIS — S2002XA Contusion of left breast, initial encounter: Secondary | ICD-10-CM

## 2013-07-23 NOTE — Progress Notes (Signed)
Patient ID: Kerri Mills, female   DOB: [birth year], 79 y.o.   MRN: [id number]  Chief Complaint  Patient presents with  . Follow-up    3 month left diagnostic mammgoram     HPI Kerri Mills is a 79 y.o. female who presents for a breast evaluation. The most recent mammogram was done on 07/16/13 with a birad category 2. Patient does perform regular self breast checks and gets regular mammograms done.  The patient denies any new problem with the breasts at this time.    HPI  Past Medical History  Diagnosis Date  . Hypertension   . Gastric ulcer   . Heart disease   . Breast CA   . GERD (gastroesophageal reflux disease)   . Hypercholesterolemia   . Seroma     HISTORY OF LFT BREAST  . Anxiety   . Infiltrating lobular carcinoma of left breast 03/19/2011  . Melanoma in situ of upper extremity 03/19/2011  . Thyroid cancer 03/19/2011  . Diverticulitis 2013  . Sleep apnea   . Arthritis   . Depression   . Allergy   . Colitis     Past Surgical History  Procedure Laterality Date  . Tonsillectomy    . Cholecystectomy    . Melanoma excision      RT UPPER ARM  . Thyroid surgery      FOR THYROID CANCER  . Partial hysterectomy      bleeding, ovaries in place.    . Colonoscopy  2013  . Breast lumpectomy  2011    left breast  . Abdominal hysterectomy  1973    partial    Family History  Problem Relation Age of Onset  . Heart disease Mother   . Cancer Brother     lung     Social History History  Substance Use Topics  . Smoking status: Never Smoker   . Smokeless tobacco: Never Used  . Alcohol Use: Yes     Comment: social drinking. average times a week    Allergies  Allergen Reactions  . Lipitor [Atorvastatin Calcium] Other (See Comments)    Stiffness & soreness  . Penicillins Rash    REACTION: Unknown reaction    Current Outpatient Prescriptions  Medication Sig Dispense Refill  . anastrozole (ARIMIDEX) 1 MG tablet Take 1 mg by mouth daily.        . Cholecalciferol  (VITAMIN D) 1000 UNITS capsule Take 1,000 Units by mouth daily.        . DULoxetine (CYMBALTA) 60 MG capsule Take 1 capsule (60 mg total) by mouth daily.  90 capsule  0  . gabapentin (NEURONTIN) 300 MG capsule Take 1 capsule (300 mg total) by mouth 2 (two) times daily.  180 capsule  0  . levothyroxine (SYNTHROID, LEVOTHROID) 88 MCG tablet Take 1 tablet (88 mcg total) by mouth daily before breakfast.  30 tablet  5  . losartan-hydrochlorothiazide (HYZAAR) 100-12.5 MG per tablet Take 1 tablet by mouth daily.        Marland Kitchen lovastatin (MEVACOR) 40 MG tablet Take 40 mg by mouth daily.        . Multiple Vitamins-Minerals (PRESERVISION AREDS 2 PO) Take 2 tablets by mouth daily.        Marland Kitchen omeprazole (PRILOSEC OTC) 20 MG tablet Take 20 mg by mouth daily.        . propranolol (INDERAL) 10 MG tablet Take 10 mg by mouth daily as needed.       . warfarin (COUMADIN)  3 MG tablet Take 3 mg by mouth daily.       No current facility-administered medications for this visit.    Review of Systems Review of Systems  Constitutional: Negative.   Respiratory: Negative.   Cardiovascular: Negative.     Blood pressure 118/80, pulse 74, resp. rate 16, height 5\' 6"  (1.676 m), weight 184 lb (83.462 kg).  Physical Exam Physical Exam  Constitutional: She is oriented to person, place, and time. She appears well-developed and well-nourished.  Neck: No thyromegaly present.  Cardiovascular: Normal rate, regular rhythm and normal heart sounds.   No murmur heard. Pulmonary/Chest: Effort normal and breath sounds normal. Right breast exhibits no inverted nipple, no mass, no nipple discharge, no skin change and no tenderness. Left breast exhibits no inverted nipple, no mass, no nipple discharge, no skin change and no tenderness.  12 cm marble 1 o'clock of left breast.   Lymphadenopathy:    She has no cervical adenopathy.    She has no axillary adenopathy.  Neurological: She is alert and oriented to person, place, and time.  Skin:  Skin is warm and dry.    Data Reviewed July 16, 2013 left breast mammogram was reviewed. Scarring consistent with previous surgery and radiation is evident. BI-RAD-2.  Assessment    Doing well post treatment of her left breast cancer.     Plan    Follow up examination with bilateral mammograms in 6 months as planned.        Kerri Mills 07/23/2013, 7:50 PM

## 2013-07-23 NOTE — Patient Instructions (Signed)
Patient to return in 6 months with a bilateral screening mammogram.

## 2013-08-13 ENCOUNTER — Other Ambulatory Visit (INDEPENDENT_AMBULATORY_CARE_PROVIDER_SITE_OTHER): Payer: Medicare Other

## 2013-08-13 DIAGNOSIS — Z7901 Long term (current) use of anticoagulants: Secondary | ICD-10-CM

## 2013-08-13 DIAGNOSIS — R739 Hyperglycemia, unspecified: Secondary | ICD-10-CM

## 2013-08-13 DIAGNOSIS — Z5181 Encounter for therapeutic drug level monitoring: Secondary | ICD-10-CM

## 2013-08-13 DIAGNOSIS — R7309 Other abnormal glucose: Secondary | ICD-10-CM

## 2013-08-13 DIAGNOSIS — E039 Hypothyroidism, unspecified: Secondary | ICD-10-CM

## 2013-08-13 LAB — TSH: TSH: 0.89 u[IU]/mL (ref 0.35–5.50)

## 2013-08-14 LAB — GLUCOSE, FASTING: Glucose, Fasting: 106 mg/dL — ABNORMAL HIGH (ref 70–99)

## 2013-08-16 ENCOUNTER — Encounter: Payer: Self-pay | Admitting: Internal Medicine

## 2013-08-16 ENCOUNTER — Ambulatory Visit (INDEPENDENT_AMBULATORY_CARE_PROVIDER_SITE_OTHER): Payer: Medicare Other | Admitting: Internal Medicine

## 2013-08-16 ENCOUNTER — Encounter (INDEPENDENT_AMBULATORY_CARE_PROVIDER_SITE_OTHER): Payer: Self-pay

## 2013-08-16 VITALS — BP 122/80 | HR 70 | Temp 97.9°F | Ht 65.25 in | Wt 182.0 lb

## 2013-08-16 DIAGNOSIS — C73 Malignant neoplasm of thyroid gland: Secondary | ICD-10-CM

## 2013-08-16 DIAGNOSIS — K219 Gastro-esophageal reflux disease without esophagitis: Secondary | ICD-10-CM

## 2013-08-16 DIAGNOSIS — C50919 Malignant neoplasm of unspecified site of unspecified female breast: Secondary | ICD-10-CM

## 2013-08-16 DIAGNOSIS — C4360 Malignant melanoma of unspecified upper limb, including shoulder: Secondary | ICD-10-CM

## 2013-08-16 DIAGNOSIS — D0360 Melanoma in situ of unspecified upper limb, including shoulder: Secondary | ICD-10-CM

## 2013-08-16 DIAGNOSIS — Z733 Stress, not elsewhere classified: Secondary | ICD-10-CM

## 2013-08-16 DIAGNOSIS — F439 Reaction to severe stress, unspecified: Secondary | ICD-10-CM

## 2013-08-16 DIAGNOSIS — G4733 Obstructive sleep apnea (adult) (pediatric): Secondary | ICD-10-CM

## 2013-08-16 NOTE — Progress Notes (Signed)
Subjective:    Patient ID: Kerri Mills, female    DOB: Feb 13, 1931, 79 y.o.   MRN: [id number]  HPI 79 year old female with past history of thyroid cancer, breast cancer, GERD, atrial fibrillation, hypertension, hypercholesterolemia and sleep apnea.  Moved to [place] in 7/13.  Was seeing Dr Fidela Juneau.  Reports increased stress.  On Cymbalta and Neurontin.  Has been seeing psychiatrist/counselor.  Feels things are stable now.  Her today for a physical exam.  Has a history of atrial fib.  On coumadin.   Apparently seeing Dr Lemar Livings.  With history of breast cancer.  Apparently had a "breast clot" and infection after treatment.  Had f/u mammogram 10/14.  Felt stable.  Recommended a six month follow up.  She also has a history of melanoma.  Seeing Dr Adolphus Birchwood.  Overall she feels she is doing relatively well.  Staying active.  No increased heart rate or palpitations.  No sob.  Acid reflux controlled.  Bowels stable. Saw Dr Fransico Michael in 9/14.     Past Medical History  Diagnosis Date  . Hypertension   . Gastric ulcer   . Heart disease   . Breast CA   . GERD (gastroesophageal reflux disease)   . Hypercholesterolemia   . Seroma     HISTORY OF LFT BREAST  . Anxiety   . Infiltrating lobular carcinoma of left breast 03/19/2011  . Melanoma in situ of upper extremity 03/19/2011  . Thyroid cancer 03/19/2011  . Diverticulitis 2013  . Sleep apnea   . Arthritis   . Depression   . Allergy   . Colitis     Current Outpatient Prescriptions on File Prior to Visit  Medication Sig Dispense Refill  . anastrozole (ARIMIDEX) 1 MG tablet Take 1 mg by mouth daily.        . Cholecalciferol (VITAMIN D) 1000 UNITS capsule Take 1,000 Units by mouth daily.        . DULoxetine (CYMBALTA) 60 MG capsule Take 1 capsule (60 mg total) by mouth daily.  90 capsule  0  . levothyroxine (SYNTHROID, LEVOTHROID) 88 MCG tablet Take 1 tablet (88 mcg total) by mouth daily before breakfast.  30 tablet  5  . losartan-hydrochlorothiazide  (HYZAAR) 100-12.5 MG per tablet Take 1 tablet by mouth daily.        Marland Kitchen lovastatin (MEVACOR) 40 MG tablet Take 40 mg by mouth daily.        . Multiple Vitamins-Minerals (PRESERVISION AREDS 2 PO) Take 2 tablets by mouth daily.        Marland Kitchen omeprazole (PRILOSEC OTC) 20 MG tablet Take 20 mg by mouth daily.        . propranolol (INDERAL) 10 MG tablet Take 10 mg by mouth daily as needed.       . warfarin (COUMADIN) 3 MG tablet Take 3 mg by mouth daily.       No current facility-administered medications on file prior to visit.    Review of Systems Patient denies any headache, lightheadedness or dizziness.  No sinus or allergy symptoms.  No chest pain, tightness or palpitations.  No increased shortness of breath, cough or congestion.  No nausea or vomiting.  Acid reflux controlled on omeprazole.  No abdominal pain or cramping.  No bowel change, such as diarrhea, constipation, BRBPR or melana.  No urine change.  Previous increased stress.  Stable now.  Overall she feels she is doing well.        Objective:   Physical Exam  Filed Vitals:   08/16/13 1109  BP: 122/80  Pulse: 70  Temp: 97.9 F (35.37 C)   79 year old female in no acute distress.   HEENT:  Nares- clear.  Oropharynx - without lesions. NECK:  Supple.  Nontender.  No audible bruit.  HEART:  Appears to be regular. LUNGS:  No crackles or wheezing audible.  Respirations even and unlabored.  RADIAL PULSE:  Equal bilaterally.    BREASTS:  No nipple discharge or nipple retraction present.  Could not appreciate any distinct nodules or axillary adenopathy.  ABDOMEN:  Soft, nontender.  Bowel sounds present and normal.  No audible abdominal bruit.  EXTREMITIES:  No increased edema present.  DP pulses palpable and equal bilaterally.          Assessment & Plan:  HEALTH MAINTENANCE.  Physical today.  Mammogram 10/14.  Recommend a f/u mammogram in 6 months.   Colonoscopy 2013.

## 2013-08-16 NOTE — Progress Notes (Signed)
Pre-visit discussion using our clinic review tool. No additional management support is needed unless otherwise documented below in the visit note.  

## 2013-08-18 ENCOUNTER — Encounter: Payer: Self-pay | Admitting: Internal Medicine

## 2013-08-18 NOTE — Assessment & Plan Note (Signed)
Doing better now.  On cymbalta.  Has seen psychiattry.  Follow.    

## 2013-08-18 NOTE — Assessment & Plan Note (Signed)
Followed by Dr Dasher.   

## 2013-08-18 NOTE — Assessment & Plan Note (Signed)
Currently doing well.  Followed by Dr Lemar Livings.  Up to date with mammograms.  Last mammogram 10/14.  Due f/u in 6 months.

## 2013-08-18 NOTE — Assessment & Plan Note (Signed)
Reflux controlled.  On omeprazole.  Follow.   

## 2013-08-18 NOTE — Assessment & Plan Note (Signed)
Using CPAP 

## 2013-08-18 NOTE — Assessment & Plan Note (Signed)
On thyroid replacement.  Follow tsh.  

## 2013-09-11 ENCOUNTER — Other Ambulatory Visit (INDEPENDENT_AMBULATORY_CARE_PROVIDER_SITE_OTHER): Payer: Medicare Other

## 2013-09-11 DIAGNOSIS — Z7901 Long term (current) use of anticoagulants: Secondary | ICD-10-CM

## 2013-09-11 LAB — PROTIME-INR
INR: 1.6 ratio — ABNORMAL HIGH (ref 0.8–1.0)
Prothrombin Time: 16.4 s — ABNORMAL HIGH (ref 10.2–12.4)

## 2013-09-23 ENCOUNTER — Other Ambulatory Visit (INDEPENDENT_AMBULATORY_CARE_PROVIDER_SITE_OTHER): Payer: Medicare Other

## 2013-09-23 DIAGNOSIS — Z7901 Long term (current) use of anticoagulants: Secondary | ICD-10-CM

## 2013-09-23 LAB — PROTIME-INR: Prothrombin Time: 30.9 s — ABNORMAL HIGH (ref 10.2–12.4)

## 2013-09-27 ENCOUNTER — Other Ambulatory Visit (INDEPENDENT_AMBULATORY_CARE_PROVIDER_SITE_OTHER): Payer: Medicare Other

## 2013-09-27 ENCOUNTER — Encounter: Payer: Self-pay | Admitting: *Deleted

## 2013-09-27 DIAGNOSIS — Z7901 Long term (current) use of anticoagulants: Secondary | ICD-10-CM

## 2013-09-27 LAB — PROTIME-INR
INR: 2.6 ratio — ABNORMAL HIGH (ref 0.8–1.0)
Prothrombin Time: 27.2 s — ABNORMAL HIGH (ref 10.2–12.4)

## 2013-10-04 ENCOUNTER — Other Ambulatory Visit (INDEPENDENT_AMBULATORY_CARE_PROVIDER_SITE_OTHER): Payer: Medicare Other

## 2013-10-04 DIAGNOSIS — Z7901 Long term (current) use of anticoagulants: Secondary | ICD-10-CM

## 2013-10-04 LAB — PROTIME-INR
INR: 2.7 ratio — AB (ref 0.8–1.0)
Prothrombin Time: 27.8 s — ABNORMAL HIGH (ref 10.2–12.4)

## 2013-10-14 ENCOUNTER — Other Ambulatory Visit: Payer: Self-pay | Admitting: *Deleted

## 2013-10-21 ENCOUNTER — Other Ambulatory Visit: Payer: Medicare Other

## 2013-10-21 ENCOUNTER — Other Ambulatory Visit (INDEPENDENT_AMBULATORY_CARE_PROVIDER_SITE_OTHER): Payer: Medicare Other

## 2013-10-21 ENCOUNTER — Encounter (INDEPENDENT_AMBULATORY_CARE_PROVIDER_SITE_OTHER): Payer: Self-pay

## 2013-10-21 DIAGNOSIS — Z7901 Long term (current) use of anticoagulants: Secondary | ICD-10-CM

## 2013-10-21 LAB — PROTIME-INR
INR: 2.4 ratio — ABNORMAL HIGH (ref 0.8–1.0)
PROTHROMBIN TIME: 25.3 s — AB (ref 10.2–12.4)

## 2013-10-25 ENCOUNTER — Other Ambulatory Visit: Payer: Self-pay | Admitting: *Deleted

## 2013-10-25 DIAGNOSIS — F329 Major depressive disorder, single episode, unspecified: Secondary | ICD-10-CM

## 2013-10-25 MED ORDER — GABAPENTIN 300 MG PO CAPS: 600.0000 mg | ORAL_CAPSULE | Freq: Every day | ORAL | Status: DC

## 2013-10-25 MED ORDER — DULOXETINE HCL 60 MG PO CPEP: 60.0000 mg | ORAL_CAPSULE | Freq: Every day | ORAL | Status: DC

## 2013-10-25 NOTE — Telephone Encounter (Signed)
Refilled cymbalta and gabapentin (3 month supply with one refill).

## 2013-10-25 NOTE — Telephone Encounter (Signed)
Okay to refill? Pt states that you told her at her last visit that you would refill these medications for her since she has been on them for years now. She currently takes the Gabapentin 300mg  2 QHS, & the Cymbalta 60mg  QHS. Pt would like a 90 day supply sent to [facility]

## 2013-10-28 ENCOUNTER — Other Ambulatory Visit: Payer: Self-pay

## 2013-10-28 ENCOUNTER — Telehealth: Payer: Self-pay

## 2013-10-28 DIAGNOSIS — Z853 Personal history of malignant neoplasm of breast: Secondary | ICD-10-CM

## 2013-10-28 MED ORDER — ANASTROZOLE 1 MG PO TABS: 1.0000 mg | ORAL_TABLET | Freq: Every day | ORAL | Status: DC

## 2013-10-28 NOTE — Telephone Encounter (Signed)
OK to refill. Send RX for Arimidex, 1 mg, # 90 w/ 3 Refills to her pharmacy of choice.

## 2013-10-28 NOTE — Telephone Encounter (Signed)
Patient called and states that she has been on Arimidex for history of breast cancer. She needs a refill but the prescriptions was originally prescribed by Dr Arline Asp whom she does not see anymore. Her primary Dr. Nicki Reaper was not comfortable with refilling this medication and suggested that she ask you about this. She needs a 90 day supply of this sent to the [facility] on [address]. Her prescription is for 1mg  po daily.

## 2013-11-18 ENCOUNTER — Other Ambulatory Visit: Payer: Self-pay | Admitting: *Deleted

## 2013-11-18 MED ORDER — WARFARIN SODIUM 3 MG PO TABS: 3.0000 mg | ORAL_TABLET | Freq: Every day | ORAL | Status: DC

## 2013-11-19 ENCOUNTER — Other Ambulatory Visit: Payer: Medicare Other

## 2013-11-21 ENCOUNTER — Other Ambulatory Visit: Payer: Medicare Other

## 2013-11-26 ENCOUNTER — Other Ambulatory Visit (INDEPENDENT_AMBULATORY_CARE_PROVIDER_SITE_OTHER): Payer: Medicare Other

## 2013-11-26 DIAGNOSIS — Z7901 Long term (current) use of anticoagulants: Secondary | ICD-10-CM

## 2013-11-26 LAB — PROTIME-INR
INR: 3.1 ratio — ABNORMAL HIGH (ref 0.8–1.0)
Prothrombin Time: 32.3 s — ABNORMAL HIGH (ref 10.2–12.4)

## 2013-12-06 ENCOUNTER — Other Ambulatory Visit (INDEPENDENT_AMBULATORY_CARE_PROVIDER_SITE_OTHER): Payer: Medicare Other

## 2013-12-06 DIAGNOSIS — Z7901 Long term (current) use of anticoagulants: Secondary | ICD-10-CM

## 2013-12-06 LAB — PROTIME-INR
INR: 2.4 ratio — ABNORMAL HIGH (ref 0.8–1.0)
Prothrombin Time: 25.3 s — ABNORMAL HIGH (ref 10.2–12.4)

## 2013-12-16 ENCOUNTER — Other Ambulatory Visit: Payer: Self-pay | Admitting: *Deleted

## 2013-12-16 MED ORDER — LOSARTAN POTASSIUM-HCTZ 100-12.5 MG PO TABS: 1.0000 | ORAL_TABLET | Freq: Every day | ORAL | Status: DC

## 2013-12-18 ENCOUNTER — Encounter (INDEPENDENT_AMBULATORY_CARE_PROVIDER_SITE_OTHER): Payer: Self-pay

## 2013-12-18 ENCOUNTER — Ambulatory Visit (INDEPENDENT_AMBULATORY_CARE_PROVIDER_SITE_OTHER): Payer: Medicare Other | Admitting: Internal Medicine

## 2013-12-18 ENCOUNTER — Encounter: Payer: Self-pay | Admitting: Internal Medicine

## 2013-12-18 VITALS — BP 120/78 | HR 69 | Temp 98.2°F | Ht 65.25 in | Wt 186.2 lb

## 2013-12-18 DIAGNOSIS — D0360 Melanoma in situ of unspecified upper limb, including shoulder: Secondary | ICD-10-CM

## 2013-12-18 DIAGNOSIS — Z23 Encounter for immunization: Secondary | ICD-10-CM

## 2013-12-18 DIAGNOSIS — I4891 Unspecified atrial fibrillation: Secondary | ICD-10-CM

## 2013-12-18 DIAGNOSIS — C50919 Malignant neoplasm of unspecified site of unspecified female breast: Secondary | ICD-10-CM

## 2013-12-18 DIAGNOSIS — K219 Gastro-esophageal reflux disease without esophagitis: Secondary | ICD-10-CM

## 2013-12-18 DIAGNOSIS — E7800 Pure hypercholesterolemia, unspecified: Secondary | ICD-10-CM

## 2013-12-18 DIAGNOSIS — G4733 Obstructive sleep apnea (adult) (pediatric): Secondary | ICD-10-CM

## 2013-12-18 DIAGNOSIS — F439 Reaction to severe stress, unspecified: Secondary | ICD-10-CM

## 2013-12-18 DIAGNOSIS — R739 Hyperglycemia, unspecified: Secondary | ICD-10-CM

## 2013-12-18 DIAGNOSIS — Z733 Stress, not elsewhere classified: Secondary | ICD-10-CM

## 2013-12-18 DIAGNOSIS — C4360 Malignant melanoma of unspecified upper limb, including shoulder: Secondary | ICD-10-CM

## 2013-12-18 DIAGNOSIS — C73 Malignant neoplasm of thyroid gland: Secondary | ICD-10-CM

## 2013-12-18 DIAGNOSIS — R7309 Other abnormal glucose: Secondary | ICD-10-CM

## 2013-12-18 NOTE — Assessment & Plan Note (Addendum)
Currently doing well.  Followed by Dr Bary Castilla.  Up to date with mammograms.  Last mammogram 10/14.  Due f/u in 6 months after last.  Due to see Dr Bary Castilla next month.

## 2013-12-18 NOTE — Assessment & Plan Note (Addendum)
Reflux controlled.  On omeprazole.  Follow.   

## 2013-12-18 NOTE — Progress Notes (Signed)
Subjective:    Patient ID: Kerri Mills, female    DOB: [birth year], 80 y.o.   MRN: [id number]  HPI 80 year old female with past history of thyroid cancer, breast cancer, GERD, atrial fibrillation, hypertension, hypercholesterolemia and sleep apnea.  Was having increased stress.  On Cymbalta and Neurontin.  Has been seeing psychiatrist/counselor.  Feels things are stable now.  Here today for a scheduled follow up.  Has a history of atrial fib.  On coumadin.   Seeing Dr Bary Castilla.  With history of breast cancer.  Apparently had a "breast clot" and infection after treatment. Had f/u mammogram 10/14.  Felt stable.  Recommended a six month follow up.  Due to see Dr Bary Castilla next month.  She also has a history of melanoma.  Seeing Dr Evorn Gong.  Overall she feels she is doing relatively well.  Staying active.  No increased heart rate or palpitations.  No sob.  Acid reflux controlled.  Bowels stable. Saw Dr Tobe Sos in 9/14.     Past Medical History  Diagnosis Date  . Hypertension   . Gastric ulcer   . Heart disease   . Breast CA   . GERD (gastroesophageal reflux disease)   . Hypercholesterolemia   . Seroma     HISTORY OF LFT BREAST  . Anxiety   . Infiltrating lobular carcinoma of left breast 03/19/2011  . Melanoma in situ of upper extremity 03/19/2011  . Thyroid cancer 03/19/2011  . Diverticulitis 2013  . Sleep apnea   . Arthritis   . Depression   . Allergy   . Colitis     Current Outpatient Prescriptions on File Prior to Visit  Medication Sig Dispense Refill  . anastrozole (ARIMIDEX) 1 MG tablet Take 1 tablet (1 mg total) by mouth daily.  90 tablet  3  . Cholecalciferol (VITAMIN D) 1000 UNITS capsule Take 1,000 Units by mouth daily.        . DULoxetine (CYMBALTA) 60 MG capsule Take 1 capsule (60 mg total) by mouth at bedtime.  90 capsule  1  . gabapentin (NEURONTIN) 300 MG capsule Take 2 capsules (600 mg total) by mouth at bedtime.  180 capsule  1  . levothyroxine (SYNTHROID, LEVOTHROID) 88 MCG  tablet Take 1 tablet (88 mcg total) by mouth daily before breakfast.  30 tablet  5  . losartan-hydrochlorothiazide (HYZAAR) 100-12.5 MG per tablet Take 1 tablet by mouth daily.  30 tablet  2  . lovastatin (MEVACOR) 40 MG tablet Take 40 mg by mouth daily.        . Multiple Vitamins-Minerals (PRESERVISION AREDS 2 PO) Take 2 tablets by mouth daily.        Marland Kitchen omeprazole (PRILOSEC OTC) 20 MG tablet Take 20 mg by mouth daily.        . propranolol (INDERAL) 10 MG tablet Take 10 mg by mouth daily as needed.       . warfarin (COUMADIN) 3 MG tablet Take 1 tablet (3 mg total) by mouth daily.  30 tablet  5   No current facility-administered medications on file prior to visit.    Review of Systems Patient denies any headache, lightheadedness or dizziness.  No sinus or allergy symptoms.  No chest pain, tightness or palpitations.  No increased shortness of breath, cough or congestion.  No nausea or vomiting.  Acid reflux controlled on omeprazole.  No abdominal pain or cramping.  No bowel change, such as diarrhea, constipation, BRBPR or melana.  No urine change.  Previous increased  stress.  Stable now.  Overall she feels she is doing well.        Objective:   Physical Exam  Filed Vitals:   12/18/13 0919  BP: 120/78  Pulse: 69  Temp: 98.2 F (62.75 C)   80 year old female in no acute distress.   HEENT:  Nares- clear.  Oropharynx - without lesions. NECK:  Supple.  Nontender.  No audible bruit.  HEART:  Appears to be regular. LUNGS:  No crackles or wheezing audible.  Respirations even and unlabored.  RADIAL PULSE:  Equal bilaterally.   ABDOMEN:  Soft, nontender.  Bowel sounds present and normal.  No audible abdominal bruit.  EXTREMITIES:  No increased edema present.  DP pulses palpable and equal bilaterally.          Assessment & Plan:  HEALTH MAINTENANCE.  Physical 08/16/13.  Mammogram 10/14.  Recommend a f/u mammogram in 6 months.  Due to f/u with Dr Bary Castilla next month.   Colonoscopy 2013.

## 2013-12-18 NOTE — Assessment & Plan Note (Signed)
Followed by Dr Dasher.   

## 2013-12-18 NOTE — Assessment & Plan Note (Addendum)
On thyroid replacement.  Follow tsh.  

## 2013-12-18 NOTE — Assessment & Plan Note (Addendum)
Doing better now.  On cymbalta.  Has seen psychiattry.  Follow.    

## 2013-12-18 NOTE — Assessment & Plan Note (Signed)
On coumadin.  Follow.  

## 2013-12-18 NOTE — Progress Notes (Signed)
Pre-visit discussion using our clinic review tool. No additional management support is needed unless otherwise documented below in the visit note.  

## 2013-12-18 NOTE — Assessment & Plan Note (Signed)
Using CPAP 

## 2013-12-21 ENCOUNTER — Encounter: Payer: Self-pay | Admitting: Internal Medicine

## 2014-01-03 ENCOUNTER — Other Ambulatory Visit (INDEPENDENT_AMBULATORY_CARE_PROVIDER_SITE_OTHER): Payer: Medicare Other

## 2014-01-03 DIAGNOSIS — C4360 Malignant melanoma of unspecified upper limb, including shoulder: Secondary | ICD-10-CM

## 2014-01-03 DIAGNOSIS — R739 Hyperglycemia, unspecified: Secondary | ICD-10-CM

## 2014-01-03 DIAGNOSIS — E7800 Pure hypercholesterolemia, unspecified: Secondary | ICD-10-CM

## 2014-01-03 DIAGNOSIS — D0360 Melanoma in situ of unspecified upper limb, including shoulder: Secondary | ICD-10-CM

## 2014-01-03 DIAGNOSIS — R7309 Other abnormal glucose: Secondary | ICD-10-CM

## 2014-01-03 LAB — COMPREHENSIVE METABOLIC PANEL
ALT: 15 U/L (ref 0–35)
AST: 17 U/L (ref 0–37)
Albumin: 3.6 g/dL (ref 3.5–5.2)
Alkaline Phosphatase: 102 U/L (ref 39–117)
BILIRUBIN TOTAL: 0.6 mg/dL (ref 0.3–1.2)
BUN: 14 mg/dL (ref 6–23)
CALCIUM: 9.8 mg/dL (ref 8.4–10.5)
CO2: 33 mEq/L — ABNORMAL HIGH (ref 19–32)
Chloride: 103 mEq/L (ref 96–112)
Creatinine, Ser: 0.9 mg/dL (ref 0.4–1.2)
GFR: 67.39 mL/min (ref >60.00)
Glucose, Bld: 107 mg/dL — ABNORMAL HIGH (ref 70–99)
Potassium: 4.1 mEq/L (ref 3.5–5.1)
Sodium: 142 mEq/L (ref 135–145)
TOTAL PROTEIN: 6.4 g/dL (ref 6.0–8.3)

## 2014-01-03 LAB — LIPID PANEL
Cholesterol: 186 mg/dL (ref 0–200)
HDL: 36.6 mg/dL — ABNORMAL LOW (ref >39.00)
LDL Cholesterol: 107 mg/dL — ABNORMAL HIGH (ref 0–99)
Total CHOL/HDL Ratio: 5
Triglycerides: 210 mg/dL — ABNORMAL HIGH (ref 0.0–149.0)
VLDL: 42 mg/dL — AB (ref 0.0–40.0)

## 2014-01-03 LAB — HEMOGLOBIN A1C: Hgb A1c MFr Bld: 6.3 % (ref 4.6–6.5)

## 2014-01-05 ENCOUNTER — Other Ambulatory Visit: Payer: Self-pay | Admitting: Internal Medicine

## 2014-01-05 DIAGNOSIS — Z7901 Long term (current) use of anticoagulants: Secondary | ICD-10-CM

## 2014-01-05 NOTE — Progress Notes (Signed)
Order placed for f/u pt/inr 

## 2014-01-06 ENCOUNTER — Other Ambulatory Visit (INDEPENDENT_AMBULATORY_CARE_PROVIDER_SITE_OTHER): Payer: Medicare Other

## 2014-01-06 DIAGNOSIS — Z5181 Encounter for therapeutic drug level monitoring: Secondary | ICD-10-CM

## 2014-01-06 DIAGNOSIS — Z7901 Long term (current) use of anticoagulants: Secondary | ICD-10-CM

## 2014-01-06 LAB — PROTIME-INR
INR: 2.4 ratio — ABNORMAL HIGH (ref 0.8–1.0)
PROTHROMBIN TIME: 25 s — AB (ref 10.2–12.4)

## 2014-01-07 ENCOUNTER — Other Ambulatory Visit: Payer: Self-pay | Admitting: Internal Medicine

## 2014-01-07 DIAGNOSIS — Z7901 Long term (current) use of anticoagulants: Secondary | ICD-10-CM

## 2014-01-07 NOTE — Progress Notes (Signed)
Order placed for follow up pt/inr.

## 2014-01-08 ENCOUNTER — Ambulatory Visit: Payer: Self-pay | Admitting: General Surgery

## 2014-01-08 LAB — HM MAMMOGRAPHY

## 2014-01-09 ENCOUNTER — Encounter: Payer: Self-pay | Admitting: General Surgery

## 2014-01-20 ENCOUNTER — Ambulatory Visit (INDEPENDENT_AMBULATORY_CARE_PROVIDER_SITE_OTHER): Payer: Medicare Other | Admitting: General Surgery

## 2014-01-20 ENCOUNTER — Encounter: Payer: Self-pay | Admitting: General Surgery

## 2014-01-20 VITALS — BP 122/72 | HR 80 | Resp 16 | Ht 65.0 in | Wt 181.0 lb

## 2014-01-20 DIAGNOSIS — Z853 Personal history of malignant neoplasm of breast: Secondary | ICD-10-CM

## 2014-01-20 NOTE — Patient Instructions (Addendum)
Patient to return in one year bilateral diagnotic mammogram.  

## 2014-01-20 NOTE — Progress Notes (Signed)
Patient ID: Kerri Mills, female   DOB: Feb 16, 1931, 80 y.o.   MRN: [id number]  Chief Complaint  Patient presents with  . Follow-up    mammogram    HPI Kerri Mills is a 80 y.o. female who presents for a breast evaluation. The most recent mammogram was done on 01/08/14.  Patient does perform regular self breast checks and gets regular mammograms done.    HPI  Past Medical History  Diagnosis Date  . Hypertension   . Gastric ulcer   . Heart disease   . GERD (gastroesophageal reflux disease)   . Hypercholesterolemia   . Seroma     HISTORY OF LFT BREAST  . Anxiety   . Melanoma in situ of upper extremity 03/19/2011  . Diverticulitis 2013  . Sleep apnea   . Arthritis   . Depression   . Allergy   . Colitis   . Breast CA   . Infiltrating lobular carcinoma of left breast 03/19/2011    T2,N0, ER: 90%; PR 0%; Her 2 neu not amplified. [facility]).  . Thyroid cancer 03/19/2011    Past Surgical History  Procedure Laterality Date  . Tonsillectomy    . Cholecystectomy    . Melanoma excision      RT UPPER ARM  . Thyroid surgery      FOR THYROID CANCER  . Partial hysterectomy      bleeding, ovaries in place.    . Colonoscopy  2013  . Breast lumpectomy  2011    left breast  . Abdominal hysterectomy  1973    partial    Family History  Problem Relation Age of Onset  . Heart disease Mother   . Cancer Brother     lung     Social History History  Substance Use Topics  . Smoking status: Never Smoker   . Smokeless tobacco: Never Used  . Alcohol Use: Yes     Comment: social drinking. average times a week    Allergies  Allergen Reactions  . Lipitor [Atorvastatin Calcium] Other (See Comments)    Stiffness & soreness  . Penicillins Rash    REACTION: Unknown reaction    Current Outpatient Prescriptions  Medication Sig Dispense Refill  . anastrozole (ARIMIDEX) 1 MG tablet Take 1 tablet (1 mg total) by mouth daily.  90 tablet  3  . Cholecalciferol (VITAMIN D) 1000  UNITS capsule Take 1,000 Units by mouth daily.        . DULoxetine (CYMBALTA) 60 MG capsule Take 1 capsule (60 mg total) by mouth at bedtime.  90 capsule  1  . gabapentin (NEURONTIN) 300 MG capsule Take 2 capsules (600 mg total) by mouth at bedtime.  180 capsule  1  . levothyroxine (SYNTHROID, LEVOTHROID) 88 MCG tablet Take 1 tablet (88 mcg total) by mouth daily before breakfast.  30 tablet  5  . losartan-hydrochlorothiazide (HYZAAR) 100-12.5 MG per tablet Take 1 tablet by mouth daily.  30 tablet  2  . lovastatin (MEVACOR) 40 MG tablet Take 40 mg by mouth daily.        . Multiple Vitamins-Minerals (PRESERVISION AREDS 2 PO) Take 2 tablets by mouth daily.        Marland Kitchen omeprazole (PRILOSEC OTC) 20 MG tablet Take 20 mg by mouth daily.        . propranolol (INDERAL) 10 MG tablet Take 10 mg by mouth daily as needed.       . warfarin (COUMADIN) 3 MG tablet Take 1 tablet (  3 mg total) by mouth daily.  30 tablet  5   No current facility-administered medications for this visit.    Review of Systems Review of Systems  Constitutional: Negative.   Respiratory: Negative.   Cardiovascular: Negative.     Blood pressure 122/72, pulse 80, resp. rate 16, height 5\' 5"  (1.651 m), weight 181 lb (82.101 kg).  Physical Exam Physical Exam  Constitutional: She is oriented to person, place, and time. She appears well-developed and well-nourished.  Eyes: Conjunctivae are normal.  Cardiovascular: Normal rate, regular rhythm and normal heart sounds.   Pulmonary/Chest: Right breast exhibits no inverted nipple, no mass, no nipple discharge, no skin change and no tenderness. Left breast exhibits no inverted nipple, no mass, no nipple discharge, no skin change and no tenderness.  Thickening at 1 o'clock left breast .  Neurological: She is alert and oriented to person, place, and time.  Skin: Skin is warm.    Data Reviewed Bilateral mammogram was in January 08, 2014 were reviewed and compared to previous studies.  Postsurgical/radiation changes evident. BI-RAD-2.  Assessment    Stable breast exam. Good tolerance of aromatase inhibitor.     Plan    Will plan for a followup examination in one year. The patient may be a candidate for bone density testing, last recorded study March 2013.      PCP: Arlyss Queen Jacquelinne Speak 01/20/2014, 8:22 PM

## 2014-02-04 ENCOUNTER — Other Ambulatory Visit (INDEPENDENT_AMBULATORY_CARE_PROVIDER_SITE_OTHER): Payer: Medicare Other

## 2014-02-04 DIAGNOSIS — Z7901 Long term (current) use of anticoagulants: Secondary | ICD-10-CM

## 2014-02-04 DIAGNOSIS — Z5181 Encounter for therapeutic drug level monitoring: Secondary | ICD-10-CM

## 2014-02-04 LAB — PROTIME-INR
INR: 2.4 ratio — AB (ref 0.8–1.0)
PROTHROMBIN TIME: 26.2 s — AB (ref 9.6–13.1)

## 2014-02-05 ENCOUNTER — Other Ambulatory Visit: Payer: Self-pay | Admitting: Internal Medicine

## 2014-03-04 ENCOUNTER — Other Ambulatory Visit (INDEPENDENT_AMBULATORY_CARE_PROVIDER_SITE_OTHER): Payer: Medicare Other

## 2014-03-04 DIAGNOSIS — Z5181 Encounter for therapeutic drug level monitoring: Secondary | ICD-10-CM

## 2014-03-04 LAB — PROTIME-INR
INR: 2 ratio — AB (ref 0.8–1.0)
PROTHROMBIN TIME: 22.1 s — AB (ref 9.6–13.1)

## 2014-03-17 ENCOUNTER — Other Ambulatory Visit: Payer: Self-pay | Admitting: *Deleted

## 2014-03-17 MED ORDER — PROPRANOLOL HCL 10 MG PO TABS
10.0000 mg | ORAL_TABLET | Freq: Every day | ORAL | Status: DC | PRN
Start: 2014-03-17 — End: 2014-05-18

## 2014-03-31 ENCOUNTER — Other Ambulatory Visit (INDEPENDENT_AMBULATORY_CARE_PROVIDER_SITE_OTHER): Payer: Medicare Other

## 2014-03-31 DIAGNOSIS — Z7901 Long term (current) use of anticoagulants: Secondary | ICD-10-CM

## 2014-03-31 LAB — PROTIME-INR
INR: 2 ratio — ABNORMAL HIGH (ref 0.8–1.0)
PROTHROMBIN TIME: 21.4 s — AB (ref 9.6–13.1)

## 2014-04-10 ENCOUNTER — Other Ambulatory Visit: Payer: Self-pay | Admitting: Internal Medicine

## 2014-04-14 ENCOUNTER — Other Ambulatory Visit: Payer: Self-pay | Admitting: *Deleted

## 2014-04-14 MED ORDER — LOVASTATIN 40 MG PO TABS: 40.0000 mg | ORAL_TABLET | Freq: Every day | ORAL | Status: DC

## 2014-04-14 MED ORDER — OMEPRAZOLE MAGNESIUM 20 MG PO TBEC: 20.0000 mg | DELAYED_RELEASE_TABLET | Freq: Every day | ORAL | Status: DC

## 2014-04-16 ENCOUNTER — Other Ambulatory Visit: Payer: Self-pay | Admitting: *Deleted

## 2014-04-16 MED ORDER — OMEPRAZOLE 20 MG PO CPDR: 20.0000 mg | DELAYED_RELEASE_CAPSULE | Freq: Every day | ORAL | Status: DC

## 2014-04-21 ENCOUNTER — Ambulatory Visit (INDEPENDENT_AMBULATORY_CARE_PROVIDER_SITE_OTHER): Payer: Medicare Other | Admitting: Internal Medicine

## 2014-04-21 ENCOUNTER — Encounter: Payer: Self-pay | Admitting: Internal Medicine

## 2014-04-21 VITALS — BP 110/78 | HR 68 | Temp 98.3°F | Ht 65.0 in | Wt 181.5 lb

## 2014-04-21 DIAGNOSIS — Z733 Stress, not elsewhere classified: Secondary | ICD-10-CM

## 2014-04-21 DIAGNOSIS — G4733 Obstructive sleep apnea (adult) (pediatric): Secondary | ICD-10-CM

## 2014-04-21 DIAGNOSIS — F439 Reaction to severe stress, unspecified: Secondary | ICD-10-CM

## 2014-04-21 DIAGNOSIS — C50919 Malignant neoplasm of unspecified site of unspecified female breast: Secondary | ICD-10-CM

## 2014-04-21 DIAGNOSIS — I4891 Unspecified atrial fibrillation: Secondary | ICD-10-CM

## 2014-04-21 DIAGNOSIS — D0360 Melanoma in situ of unspecified upper limb, including shoulder: Secondary | ICD-10-CM

## 2014-04-21 DIAGNOSIS — C73 Malignant neoplasm of thyroid gland: Secondary | ICD-10-CM

## 2014-04-21 DIAGNOSIS — K219 Gastro-esophageal reflux disease without esophagitis: Secondary | ICD-10-CM

## 2014-04-21 DIAGNOSIS — R1084 Generalized abdominal pain: Secondary | ICD-10-CM

## 2014-04-21 DIAGNOSIS — C4360 Malignant melanoma of unspecified upper limb, including shoulder: Secondary | ICD-10-CM

## 2014-04-21 DIAGNOSIS — R109 Unspecified abdominal pain: Secondary | ICD-10-CM

## 2014-04-21 LAB — CBC WITH DIFFERENTIAL/PLATELET
BASOS ABS: 0 10*3/uL (ref 0.0–0.1)
Basophils Relative: 0.4 % (ref 0.0–3.0)
Eosinophils Absolute: 0.1 10*3/uL (ref 0.0–0.7)
Eosinophils Relative: 2.4 % (ref 0.0–5.0)
HEMATOCRIT: 37.5 % (ref 36.0–46.0)
Hemoglobin: 12.7 g/dL (ref 12.0–15.0)
Lymphocytes Relative: 25 % (ref 12.0–46.0)
Lymphs Abs: 1.1 10*3/uL (ref 0.7–4.0)
MCHC: 33.9 g/dL (ref 30.0–36.0)
MCV: 91.1 fl (ref 78.0–100.0)
MONO ABS: 0.4 10*3/uL (ref 0.1–1.0)
Monocytes Relative: 8.3 % (ref 3.0–12.0)
NEUTROS PCT: 63.9 % (ref 43.0–77.0)
Neutro Abs: 2.9 10*3/uL (ref 1.4–7.7)
PLATELETS: 219 10*3/uL (ref 150.0–400.0)
RBC: 4.11 Mil/uL (ref 3.87–5.11)
RDW: 12.8 % (ref 11.5–15.5)
WBC: 4.5 10*3/uL (ref 4.0–10.5)

## 2014-04-21 LAB — BASIC METABOLIC PANEL
BUN: 9 mg/dL (ref 6–23)
CHLORIDE: 105 meq/L (ref 96–112)
CO2: 29 mEq/L (ref 19–32)
Calcium: 9.9 mg/dL (ref 8.4–10.5)
Creatinine, Ser: 0.8 mg/dL (ref 0.4–1.2)
GFR: 72.16 mL/min (ref >60.00)
Glucose, Bld: 118 mg/dL — ABNORMAL HIGH (ref 70–99)
POTASSIUM: 4.3 meq/L (ref 3.5–5.1)
SODIUM: 140 meq/L (ref 135–145)

## 2014-04-21 LAB — HEPATIC FUNCTION PANEL
ALBUMIN: 3.8 g/dL (ref 3.5–5.2)
ALK PHOS: 105 U/L (ref 39–117)
ALT: 14 U/L (ref 0–35)
AST: 19 U/L (ref 0–37)
Bilirubin, Direct: 0.1 mg/dL (ref 0.0–0.3)
TOTAL PROTEIN: 6.9 g/dL (ref 6.0–8.3)
Total Bilirubin: 0.7 mg/dL (ref 0.2–1.2)

## 2014-04-21 LAB — PROTIME-INR
INR: 2 ratio — AB (ref 0.8–1.0)
Prothrombin Time: 22.1 s — ABNORMAL HIGH (ref 9.6–13.1)

## 2014-04-21 MED ORDER — OMEPRAZOLE 20 MG PO CPDR: 20.0000 mg | DELAYED_RELEASE_CAPSULE | Freq: Every day | ORAL | Status: DC

## 2014-04-21 NOTE — Assessment & Plan Note (Signed)
Reflux controlled.  On omeprazole.  Follow.

## 2014-04-21 NOTE — Assessment & Plan Note (Signed)
Using CPAP 

## 2014-04-21 NOTE — Assessment & Plan Note (Signed)
On coumadin.  Follow.

## 2014-04-21 NOTE — Assessment & Plan Note (Signed)
Doing better now.  On cymbalta.  Has seen psychiattry.  Follow.

## 2014-04-21 NOTE — Progress Notes (Signed)
Subjective:    Patient ID: Kerri Mills, female    DOB: [birth year], 80 y.o.   MRN: [id number]  HPI 80 year old female with past history of thyroid cancer, breast cancer, GERD, atrial fibrillation, hypertension, hypercholesterolemia and sleep apnea.  Was having increased stress.  On Cymbalta and Neurontin.  Has been seeing psychiatrist/counselor.  Feels things are stable now.  Here today for a scheduled follow up.  Has a history of atrial fib.  On coumadin.   Seeing Dr Bary Castilla.  With history of breast cancer.  Last mammogram 01/08/14 [facility].   She also has a history of melanoma.  Seeing Dr Evorn Gong.  Overall she feels she is doing relatively well.  Staying active.  No increased heart rate or palpitations.  No sob.  Acid reflux controlled.  Recently had a flare of diverticulitis.  Ate tomatoes and corn.  Developed lower abdominal discomfort and diarrhea.  Pain better now.  Bowels moving normally now.   Saw Dr Tobe Sos in 9/14.     Past Medical History  Diagnosis Date  . Hypertension   . Gastric ulcer   . Heart disease   . GERD (gastroesophageal reflux disease)   . Hypercholesterolemia   . Seroma     HISTORY OF LFT BREAST  . Anxiety   . Melanoma in situ of upper extremity 03/19/2011  . Diverticulitis 2013  . Sleep apnea   . Arthritis   . Depression   . Allergy   . Colitis   . Breast CA   . Infiltrating lobular carcinoma of left breast 03/19/2011    T2,N0, ER: 90%; PR 0%; Her 2 neu not amplified. [facility]).  . Thyroid cancer 03/19/2011    Current Outpatient Prescriptions on File Prior to Visit  Medication Sig Dispense Refill  . anastrozole (ARIMIDEX) 1 MG tablet Take 1 tablet (1 mg total) by mouth daily.  90 tablet  3  . Cholecalciferol (VITAMIN D) 1000 UNITS capsule Take 1,000 Units by mouth daily.        . DULoxetine (CYMBALTA) 60 MG capsule Take 1 capsule (60 mg total) by mouth at bedtime.  90 capsule  1  . gabapentin (NEURONTIN) 300 MG capsule Take 2 capsules (600 mg  total) by mouth at bedtime.  180 capsule  1  . levothyroxine (SYNTHROID, LEVOTHROID) 88 MCG tablet TAKE ONE TABLET BY MOUTH ONCE DAILY BEFORE BREAKFAST  30 tablet  5  . losartan-hydrochlorothiazide (HYZAAR) 100-12.5 MG per tablet TAKE ONE TABLET BY MOUTH ONCE DAILY  30 tablet  0  . lovastatin (MEVACOR) 40 MG tablet Take 1 tablet (40 mg total) by mouth daily.  30 tablet  6  . Multiple Vitamins-Minerals (PRESERVISION AREDS 2 PO) Take 2 tablets by mouth daily.        . propranolol (INDERAL) 10 MG tablet Take 1 tablet (10 mg total) by mouth daily as needed.  30 tablet  0  . warfarin (COUMADIN) 3 MG tablet Take 1 tablet (3 mg total) by mouth daily.  30 tablet  5   No current facility-administered medications on file prior to visit.    Review of Systems Patient denies any headache, lightheadedness or dizziness.  No sinus or allergy symptoms.  No chest pain, tightness or palpitations.  No increased shortness of breath, cough or congestion.  No nausea or vomiting.  Acid reflux controlled on omeprazole.  No abdominal pain or cramping now.   No constipation, BRBPR or melana.  Bowels normal now.   No urine  change.  Previous increased stress.  Stable now.  Overall she feels she is doing well.        Objective:   Physical Exam  Filed Vitals:   04/21/14 0959  BP: 110/78  Pulse: 68  Temp: 98.3 F (36.8 C)   Blood pressure recheck:  122/82, pulse 71  80 year old female in no acute distress.   HEENT:  Nares- clear.  Oropharynx - without lesions. NECK:  Supple.  Nontender.  No audible bruit.  HEART:  Appears to be regular. LUNGS:  No crackles or wheezing audible.  Respirations even and unlabored.  RADIAL PULSE:  Equal bilaterally.   ABDOMEN:  Soft, nontender.  Bowel sounds present and normal.  No audible abdominal bruit.  EXTREMITIES:  No increased edema present.  DP pulses palpable and equal bilaterally.          Assessment & Plan:  HEALTH MAINTENANCE.  Physical 08/16/13.  Mammogram 01/08/14 -  [facility]ads II.  Colonoscopy 2013.     I spent 25 minutes with the patient and more than 50% of the time was spent in consultation regarding the above.

## 2014-04-21 NOTE — Assessment & Plan Note (Signed)
Followed by Dr Dasher.   

## 2014-04-21 NOTE — Assessment & Plan Note (Signed)
Better now.  Bowels better.  Hold on abx.  Follow.

## 2014-04-21 NOTE — Assessment & Plan Note (Signed)
On thyroid replacement.  Follow tsh.  

## 2014-04-21 NOTE — Progress Notes (Signed)
Pre visit review using our clinic review tool, if applicable. No additional management support is needed unless otherwise documented below in the visit note. 

## 2014-04-21 NOTE — Assessment & Plan Note (Signed)
Currently doing well.  Followed by Dr Bary Castilla.  Up to date with mammograms.  Last mammogram 01/08/14 - [facility] II.

## 2014-04-22 ENCOUNTER — Other Ambulatory Visit: Payer: Self-pay | Admitting: Internal Medicine

## 2014-04-22 DIAGNOSIS — R739 Hyperglycemia, unspecified: Secondary | ICD-10-CM

## 2014-04-22 DIAGNOSIS — E7800 Pure hypercholesterolemia, unspecified: Secondary | ICD-10-CM

## 2014-04-22 DIAGNOSIS — I4891 Unspecified atrial fibrillation: Secondary | ICD-10-CM

## 2014-04-22 NOTE — Progress Notes (Signed)
Order placed for f/u labs.  

## 2014-04-26 ENCOUNTER — Other Ambulatory Visit: Payer: Self-pay | Admitting: Internal Medicine

## 2014-04-28 ENCOUNTER — Other Ambulatory Visit: Payer: BC Managed Care – PPO

## 2014-05-06 ENCOUNTER — Encounter: Payer: Self-pay | Admitting: Internal Medicine

## 2014-05-06 DIAGNOSIS — K5730 Diverticulosis of large intestine without perforation or abscess without bleeding: Secondary | ICD-10-CM

## 2014-05-06 DIAGNOSIS — K219 Gastro-esophageal reflux disease without esophagitis: Secondary | ICD-10-CM

## 2014-05-07 ENCOUNTER — Encounter: Payer: Self-pay | Admitting: Internal Medicine

## 2014-05-07 DIAGNOSIS — K5730 Diverticulosis of large intestine without perforation or abscess without bleeding: Secondary | ICD-10-CM | POA: Insufficient documentation

## 2014-05-18 ENCOUNTER — Other Ambulatory Visit: Payer: Self-pay | Admitting: Internal Medicine

## 2014-05-20 ENCOUNTER — Other Ambulatory Visit (INDEPENDENT_AMBULATORY_CARE_PROVIDER_SITE_OTHER): Payer: Medicare Other

## 2014-05-20 DIAGNOSIS — E7800 Pure hypercholesterolemia, unspecified: Secondary | ICD-10-CM

## 2014-05-20 DIAGNOSIS — R739 Hyperglycemia, unspecified: Secondary | ICD-10-CM

## 2014-05-20 DIAGNOSIS — I4891 Unspecified atrial fibrillation: Secondary | ICD-10-CM

## 2014-05-20 DIAGNOSIS — R7309 Other abnormal glucose: Secondary | ICD-10-CM

## 2014-05-20 DIAGNOSIS — R7989 Other specified abnormal findings of blood chemistry: Secondary | ICD-10-CM

## 2014-05-20 LAB — PROTIME-INR
INR: 1.4 ratio — ABNORMAL HIGH (ref 0.8–1.0)
Prothrombin Time: 15.2 s — ABNORMAL HIGH (ref 9.6–13.1)

## 2014-05-20 LAB — HEMOGLOBIN A1C: HEMOGLOBIN A1C: 6.2 % (ref 4.6–6.5)

## 2014-05-20 LAB — LIPID PANEL
CHOL/HDL RATIO: 5
CHOLESTEROL: 175 mg/dL (ref 0–200)
HDL: 33.8 mg/dL — ABNORMAL LOW (ref >39.00)
NonHDL: 141.2
TRIGLYCERIDES: 272 mg/dL — AB (ref 0.0–149.0)
VLDL: 54.4 mg/dL — ABNORMAL HIGH (ref 0.0–40.0)

## 2014-05-20 LAB — LDL CHOLESTEROL, DIRECT: Direct LDL: 103.4 mg/dL

## 2014-05-21 ENCOUNTER — Telehealth: Payer: Self-pay | Admitting: Internal Medicine

## 2014-05-21 ENCOUNTER — Other Ambulatory Visit: Payer: Self-pay | Admitting: Internal Medicine

## 2014-05-21 DIAGNOSIS — Z7901 Long term (current) use of anticoagulants: Secondary | ICD-10-CM

## 2014-05-21 NOTE — Telephone Encounter (Signed)
Pt needs to be scheduled for a physical after 08/16/14.  Thanks.

## 2014-05-21 NOTE — Progress Notes (Signed)
Order placed for f/u pt/inr 

## 2014-06-10 ENCOUNTER — Other Ambulatory Visit: Payer: Medicare Other

## 2014-06-16 ENCOUNTER — Other Ambulatory Visit (INDEPENDENT_AMBULATORY_CARE_PROVIDER_SITE_OTHER): Payer: Medicare Other

## 2014-06-16 DIAGNOSIS — Z5181 Encounter for therapeutic drug level monitoring: Secondary | ICD-10-CM

## 2014-06-16 DIAGNOSIS — Z7901 Long term (current) use of anticoagulants: Secondary | ICD-10-CM

## 2014-06-16 LAB — PROTIME-INR
INR: 2 ratio — AB (ref 0.8–1.0)
Prothrombin Time: 21.7 s — ABNORMAL HIGH (ref 9.6–13.1)

## 2014-06-17 ENCOUNTER — Other Ambulatory Visit: Payer: Self-pay | Admitting: Internal Medicine

## 2014-06-17 DIAGNOSIS — Z7901 Long term (current) use of anticoagulants: Secondary | ICD-10-CM

## 2014-06-17 NOTE — Progress Notes (Signed)
Order placed for f/u pt/inr

## 2014-06-19 ENCOUNTER — Other Ambulatory Visit: Payer: Self-pay | Admitting: Internal Medicine

## 2014-07-07 ENCOUNTER — Other Ambulatory Visit: Payer: Self-pay | Admitting: Internal Medicine

## 2014-07-15 ENCOUNTER — Other Ambulatory Visit (INDEPENDENT_AMBULATORY_CARE_PROVIDER_SITE_OTHER): Payer: Medicare Other

## 2014-07-15 DIAGNOSIS — Z5181 Encounter for therapeutic drug level monitoring: Secondary | ICD-10-CM

## 2014-07-15 DIAGNOSIS — Z7901 Long term (current) use of anticoagulants: Secondary | ICD-10-CM

## 2014-07-15 LAB — PROTIME-INR
INR: 1.9 ratio — AB (ref 0.8–1.0)
PROTHROMBIN TIME: 20.9 s — AB (ref 9.6–13.1)

## 2014-07-19 ENCOUNTER — Other Ambulatory Visit: Payer: Self-pay | Admitting: Internal Medicine

## 2014-07-25 ENCOUNTER — Other Ambulatory Visit (INDEPENDENT_AMBULATORY_CARE_PROVIDER_SITE_OTHER): Payer: Medicare Other

## 2014-07-25 DIAGNOSIS — Z7901 Long term (current) use of anticoagulants: Secondary | ICD-10-CM

## 2014-07-25 LAB — PROTIME-INR
INR: 2.1 ratio — ABNORMAL HIGH (ref 0.8–1.0)
PROTHROMBIN TIME: 22.6 s — AB (ref 9.6–13.1)

## 2014-07-28 ENCOUNTER — Encounter: Payer: Self-pay | Admitting: Internal Medicine

## 2014-08-18 ENCOUNTER — Encounter: Payer: Self-pay | Admitting: Internal Medicine

## 2014-08-18 ENCOUNTER — Ambulatory Visit (INDEPENDENT_AMBULATORY_CARE_PROVIDER_SITE_OTHER): Payer: Medicare Other | Admitting: Internal Medicine

## 2014-08-18 VITALS — BP 126/80 | HR 67 | Temp 97.8°F | Ht 64.8 in | Wt 175.0 lb

## 2014-08-18 DIAGNOSIS — Z7901 Long term (current) use of anticoagulants: Secondary | ICD-10-CM

## 2014-08-18 DIAGNOSIS — K219 Gastro-esophageal reflux disease without esophagitis: Secondary | ICD-10-CM

## 2014-08-18 DIAGNOSIS — C73 Malignant neoplasm of thyroid gland: Secondary | ICD-10-CM

## 2014-08-18 DIAGNOSIS — C50919 Malignant neoplasm of unspecified site of unspecified female breast: Secondary | ICD-10-CM

## 2014-08-18 DIAGNOSIS — F439 Reaction to severe stress, unspecified: Secondary | ICD-10-CM

## 2014-08-18 DIAGNOSIS — I4891 Unspecified atrial fibrillation: Secondary | ICD-10-CM

## 2014-08-18 DIAGNOSIS — Z658 Other specified problems related to psychosocial circumstances: Secondary | ICD-10-CM

## 2014-08-18 DIAGNOSIS — Z5181 Encounter for therapeutic drug level monitoring: Secondary | ICD-10-CM

## 2014-08-18 NOTE — Progress Notes (Signed)
Subjective:    Patient ID: Kerri Mills, female    DOB: [birth year], 80 y.o.   MRN: [id number]  HPI 80 year old female with past history of thyroid cancer, breast cancer, GERD, atrial fibrillation, hypertension, hypercholesterolemia and sleep apnea.  Was having increased stress.  On Cymbalta and Neurontin.  Has been seeing psychiatrist/counselor.  Feels things are stable now.  Here today to follow up on these issues as well as for a complete physical exam.   Has a history of atrial fib.  On coumadin.   Seeing Dr Bary Castilla.  With history of breast cancer.  Last mammogram 01/08/14 [facility].   She also has a history of melanoma.  Seeing Dr Evorn Gong.  Overall she feels she is doing relatively well.  Staying active.  No increased heart rate or palpitations.  No sob.  Acid reflux controlled. Is watching her diet.  Has decreased sweets and cut out bread.  Overall she feels she is doing well.      Past Medical History  Diagnosis Date  . Hypertension   . Gastric ulcer   . Heart disease   . GERD (gastroesophageal reflux disease)   . Hypercholesterolemia   . Seroma     HISTORY OF LFT BREAST  . Anxiety   . Melanoma in situ of upper extremity 03/19/2011  . Diverticulitis 2013  . Sleep apnea   . Arthritis   . Depression   . Allergy   . Colitis   . Breast CA   . Infiltrating lobular carcinoma of left breast 03/19/2011    T2,N0, ER: 90%; PR 0%; Her 2 neu not amplified. [facility]).  . Thyroid cancer 03/19/2011    Current Outpatient Prescriptions on File Prior to Visit  Medication Sig Dispense Refill  . anastrozole (ARIMIDEX) 1 MG tablet Take 1 tablet (1 mg total) by mouth daily. 90 tablet 3  . Cholecalciferol (VITAMIN D) 1000 UNITS capsule Take 1,000 Units by mouth daily.      . DULoxetine (CYMBALTA) 60 MG capsule TAKE 1 CAPSULE AT BEDTIME 90 capsule 1  . gabapentin (NEURONTIN) 300 MG capsule TAKE 2 CAPSULES AT BEDTIME 180 capsule 1  . levothyroxine (SYNTHROID, LEVOTHROID) 88 MCG tablet TAKE  ONE TABLET BY MOUTH ONCE DAILY BEFORE BREAKFAST 30 tablet 5  . losartan-hydrochlorothiazide (HYZAAR) 100-12.5 MG per tablet TAKE ONE TABLET BY MOUTH ONCE DAILY 30 tablet 0  . lovastatin (MEVACOR) 40 MG tablet Take 1 tablet (40 mg total) by mouth daily. 30 tablet 6  . Multiple Vitamins-Minerals (PRESERVISION AREDS 2 PO) Take 2 tablets by mouth daily.      Marland Kitchen omeprazole (PRILOSEC) 20 MG capsule Take 1 capsule (20 mg total) by mouth daily. 60 capsule 4  . propranolol (INDERAL) 10 MG tablet TAKE ONE TABLET BY MOUTH ONCE DAILY AS NEEDED 30 tablet 0  . warfarin (COUMADIN) 3 MG tablet TAKE ONE TABLET BY MOUTH ONCE DAILY 30 tablet 0   No current facility-administered medications on file prior to visit.    Review of Systems Patient denies any headache, lightheadedness or dizziness.  No sinus or allergy symptoms.  No chest pain, tightness or palpitations.  No increased shortness of breath, cough or congestion.  No nausea or vomiting.  Acid reflux controlled on omeprazole.  No abdominal pain or cramping now.   No constipation, BRBPR or melana.  Bowels doing well.   No urine change.  Increased stress. She feels she is doing relatively well. Overall she feels she is doing well.  Objective:   Physical Exam  Filed Vitals:   08/18/14 1543  BP: 126/80  Pulse: 67  Temp: 97.8 F (64.47 C)   80 year old female in no acute distress.   HEENT:  Nares- clear.  Oropharynx - without lesions. NECK:  Supple.  Nontender.  No audible bruit.  HEART:  Appears to be regular. LUNGS:  No crackles or wheezing audible.  Respirations even and unlabored.  RADIAL PULSE:  Equal bilaterally.    BREASTS:  No nipple discharge or nipple retraction present.  Could not appreciate any distinct nodules or axillary adenopathy.  ABDOMEN:  Soft, nontender.  Bowel sounds present and normal.  No audible abdominal bruit.  GU:  Not performed.    EXTREMITIES:  No increased edema present.  DP pulses palpable and equal bilaterally.           Assessment & Plan:  1. Anticoagulated on Coumadin - Protime-INR; Future - Protime-INR  2. Atrial fibrillation, unspecified On coumadin.  Rated controlled.    3. Gastroesophageal reflux disease without esophagitis Controlled on prilosec.    4. Thyroid cancer In 1992 treated by Dr Leonides Schanz.  Without recurrence.  S/p radiation therapy post op.  On thyroid replacement.  Follow tsh.   5. Infiltrating lobular carcinoma, unspecified laterality Currently doing well.  Followed by Dr Bary Castilla.  Up to date with mammograms.  Last mammogram 01/08/14 - [facility] II.   6. Stress Handling stress well.  Follow.   HEALTH MAINTENANCE.  Physical today.  Mammogram 01/08/14 - [facility]ads II.  Colonoscopy 2013.     I spent 25 minutes with the patient and more than 50% of the time was spent in consultation regarding the above.

## 2014-08-18 NOTE — Progress Notes (Signed)
Pre visit review using our clinic review tool, if applicable. No additional management support is needed unless otherwise documented below in the visit note. 

## 2014-08-19 ENCOUNTER — Other Ambulatory Visit: Payer: Self-pay | Admitting: Internal Medicine

## 2014-08-19 LAB — PROTIME-INR
INR: 2 ratio — ABNORMAL HIGH (ref 0.8–1.0)
Prothrombin Time: 21.9 s — ABNORMAL HIGH (ref 9.6–13.1)

## 2014-08-20 ENCOUNTER — Encounter: Payer: Self-pay | Admitting: *Deleted

## 2014-08-20 ENCOUNTER — Telehealth: Payer: Self-pay | Admitting: *Deleted

## 2014-08-20 NOTE — Telephone Encounter (Signed)
Pt notified of pt.inr results, appointment made Dec 23.2015 10am

## 2014-08-21 ENCOUNTER — Encounter: Payer: Self-pay | Admitting: Internal Medicine

## 2014-08-29 ENCOUNTER — Other Ambulatory Visit: Payer: Self-pay | Admitting: *Deleted

## 2014-09-16 ENCOUNTER — Other Ambulatory Visit: Payer: Medicare Other

## 2014-09-17 ENCOUNTER — Other Ambulatory Visit (INDEPENDENT_AMBULATORY_CARE_PROVIDER_SITE_OTHER): Payer: Medicare Other

## 2014-09-17 DIAGNOSIS — Z7901 Long term (current) use of anticoagulants: Secondary | ICD-10-CM

## 2014-09-17 LAB — PROTIME-INR
INR: 1.8 ratio — ABNORMAL HIGH (ref 0.8–1.0)
PROTHROMBIN TIME: 19.2 s — AB (ref 9.6–13.1)

## 2014-09-24 ENCOUNTER — Other Ambulatory Visit (INDEPENDENT_AMBULATORY_CARE_PROVIDER_SITE_OTHER): Payer: Medicare Other

## 2014-09-24 DIAGNOSIS — Z7901 Long term (current) use of anticoagulants: Secondary | ICD-10-CM

## 2014-09-24 LAB — PROTIME-INR
INR: 1.9 ratio — ABNORMAL HIGH (ref 0.8–1.0)
Prothrombin Time: 20.7 s — ABNORMAL HIGH (ref 9.6–13.1)

## 2014-10-09 ENCOUNTER — Other Ambulatory Visit (INDEPENDENT_AMBULATORY_CARE_PROVIDER_SITE_OTHER): Payer: Medicare Other

## 2014-10-09 ENCOUNTER — Encounter: Payer: Self-pay | Admitting: Nurse Practitioner

## 2014-10-09 ENCOUNTER — Ambulatory Visit (INDEPENDENT_AMBULATORY_CARE_PROVIDER_SITE_OTHER): Payer: Medicare Other | Admitting: Nurse Practitioner

## 2014-10-09 VITALS — BP 118/72 | HR 82 | Temp 97.8°F | Resp 14 | Wt 180.8 lb

## 2014-10-09 DIAGNOSIS — M542 Cervicalgia: Secondary | ICD-10-CM | POA: Diagnosis not present

## 2014-10-09 DIAGNOSIS — R0982 Postnasal drip: Secondary | ICD-10-CM | POA: Diagnosis not present

## 2014-10-09 DIAGNOSIS — Z7901 Long term (current) use of anticoagulants: Secondary | ICD-10-CM

## 2014-10-09 LAB — PROTIME-INR
INR: 1.9 ratio — ABNORMAL HIGH (ref 0.8–1.0)
Prothrombin Time: 21.1 s — ABNORMAL HIGH (ref 9.6–13.1)

## 2014-10-09 NOTE — Progress Notes (Signed)
Pre visit review using our clinic review tool, if applicable. No additional management support is needed unless otherwise documented below in the visit note. 

## 2014-10-09 NOTE — Progress Notes (Signed)
Subjective:    Patient ID: Kerri Mills, female    DOB: [birth year], 81 y.o.   MRN: [id number]  HPI  Kerri Mills is a 81 yo female with a CC of hoarseness, rhinorrhea, headaches, and right side neck pain.   1) End of last week started feeling bad. With headaches generalized, right side neck stiff/pain from sleeping on right side (see 2), and rhinorrhea with clear drainage. She also states she is hoarse.   2) Achy, sore, stiff on right side of neck and jaw- sleeps on that side on a pillow with a hole in the middle for her ear. Dog is with her in bed and she is unable to move positions much.   3) Voltaren gel- helpful on knees tried from a friend's prescription.   Review of Systems  Constitutional: Positive for diaphoresis and fatigue. Negative for fever and chills.       Sweated during the day on Sunday and Monday- did not check temperature.   HENT: Positive for postnasal drip, rhinorrhea, sinus pressure and sore throat. Negative for ear discharge, ear pain and sneezing.   Eyes: Negative for visual disturbance.  Respiratory: Positive for cough and wheezing. Negative for chest tightness.   Gastrointestinal: Negative for nausea, vomiting and diarrhea.  Musculoskeletal: Positive for neck pain and neck stiffness.       On right, not symptomatic for meningitis. See note  Skin: Negative for rash.  Neurological: Positive for headaches.   Past Medical History  Diagnosis Date  . Hypertension   . Gastric ulcer   . Heart disease   . GERD (gastroesophageal reflux disease)   . Hypercholesterolemia   . Seroma     HISTORY OF LFT BREAST  . Anxiety   . Melanoma in situ of upper extremity 03/19/2011  . Diverticulitis 2013  . Sleep apnea   . Arthritis   . Depression   . Allergy   . Colitis   . Breast CA   . Infiltrating lobular carcinoma of left breast 03/19/2011    T2,N0, ER: 90%; PR 0%; Her 2 neu not amplified. [facility]).  . Thyroid cancer 03/19/2011    History   Social  History  . Marital Status: Widowed    Spouse Name: N/A    Number of Children: N/A  . Years of Education: N/A   Occupational History  . Not on file.   Social History Main Topics  . Smoking status: Never Smoker   . Smokeless tobacco: Never Used  . Alcohol Use: 0.0 oz/week    0 Not specified per week     Comment: social drinking. average times a week  . Drug Use: No  . Sexual Activity: Not on file   Other Topics Concern  . Not on file   Social History Narrative    Past Surgical History  Procedure Laterality Date  . Tonsillectomy    . Cholecystectomy    . Melanoma excision      RT UPPER ARM  . Thyroid surgery      FOR THYROID CANCER  . Partial hysterectomy      bleeding, ovaries in place.    . Colonoscopy  2013  . Breast lumpectomy  2011    left breast  . Abdominal hysterectomy  1973    partial    Family History  Problem Relation Age of Onset  . Heart disease Mother   . Cancer Brother     lung     Allergies  Allergen Reactions  .  Lipitor [Atorvastatin Calcium] Other (See Comments)    Stiffness & soreness  . Penicillins Rash    REACTION: Unknown reaction    Current Outpatient Prescriptions on File Prior to Visit  Medication Sig Dispense Refill  . anastrozole (ARIMIDEX) 1 MG tablet Take 1 tablet (1 mg total) by mouth daily. 90 tablet 3  . Cholecalciferol (VITAMIN D) 1000 UNITS capsule Take 1,000 Units by mouth daily.      . DULoxetine (CYMBALTA) 60 MG capsule TAKE 1 CAPSULE AT BEDTIME 90 capsule 1  . gabapentin (NEURONTIN) 300 MG capsule TAKE 2 CAPSULES AT BEDTIME 180 capsule 1  . levothyroxine (SYNTHROID, LEVOTHROID) 88 MCG tablet TAKE ONE TABLET BY MOUTH ONCE DAILY BEFORE BREAKFAST 30 tablet 10  . losartan-hydrochlorothiazide (HYZAAR) 100-12.5 MG per tablet TAKE ONE TABLET BY MOUTH ONCE DAILY 30 tablet 5  . lovastatin (MEVACOR) 40 MG tablet Take 1 tablet (40 mg total) by mouth daily. 30 tablet 6  . Multiple Vitamins-Minerals (PRESERVISION AREDS 2 PO) Take  2 tablets by mouth daily.      Marland Kitchen omeprazole (PRILOSEC) 20 MG capsule Take 1 capsule (20 mg total) by mouth daily. 60 capsule 4  . propranolol (INDERAL) 10 MG tablet TAKE ONE TABLET BY MOUTH ONCE DAILY AS NEEDED 30 tablet 5  . warfarin (COUMADIN) 3 MG tablet TAKE ONE TABLET BY MOUTH ONCE DAILY 30 tablet 1   No current facility-administered medications on file prior to visit.      Objective:   Physical Exam  Constitutional: She is oriented to person, place, and time. She appears well-developed and well-nourished. No distress.  HENT:  Head: Normocephalic and atraumatic.  Right Ear: External ear normal.  Left Ear: External ear normal.  Mouth/Throat: No oropharyngeal exudate.  Eyes: Conjunctivae and EOM are normal. Pupils are equal, round, and reactive to light. Right eye exhibits no discharge. Left eye exhibits no discharge. No scleral icterus.  Neck: Normal range of motion. Neck supple. No thyromegaly present.  Cardiovascular: Normal rate, regular rhythm, normal heart sounds and intact distal pulses.  Exam reveals no gallop and no friction rub.   No murmur heard. Pulmonary/Chest: Effort normal and breath sounds normal. No respiratory distress. She has no wheezes. She has no rales. She exhibits no tenderness.  Musculoskeletal: Normal range of motion. She exhibits tenderness. She exhibits no edema.  Right side of neck tender to palpation. Trapezius muscles tighter on right than left.   Lymphadenopathy:    She has no cervical adenopathy.  Neurological: She is alert and oriented to person, place, and time. No cranial nerve deficit. She exhibits normal muscle tone. Coordination normal.  Skin: Skin is warm and dry. No rash noted. She is not diaphoretic.  Psychiatric: She has a normal mood and affect. Her behavior is normal. Judgment and thought content normal.    BP 118/72 mmHg  Pulse 82  Temp(Src) 97.8 F (36.6 C) (Oral)  Resp 14  Wt 180 lb 12.8 oz (82.01 kg)  SpO2 98%     Assessment &  Plan:

## 2014-10-09 NOTE — Patient Instructions (Signed)
Try tylenol extra-strength for your neck pain and headaches.   Mucinex DM for cough/congestion   Call us by Monday if you are not feeling better, feeling worse, or fever of 101 or greater.

## 2014-10-11 DIAGNOSIS — M542 Cervicalgia: Secondary | ICD-10-CM | POA: Insufficient documentation

## 2014-10-11 DIAGNOSIS — R0982 Postnasal drip: Secondary | ICD-10-CM | POA: Insufficient documentation

## 2014-10-11 NOTE — Assessment & Plan Note (Signed)
No sings of meningitis. Stable. Pt sleeping only on right because of ear and dog in bed. Pt to try extra strength Tylenol OTC and report if any symptoms worsen. Encouraged different sleeping positions. FU prn.

## 2014-10-11 NOTE — Assessment & Plan Note (Signed)
Stable. Pt to try OTC Mucinex and to call us Monday if worse or not improving.

## 2014-10-14 ENCOUNTER — Other Ambulatory Visit: Payer: Self-pay | Admitting: Internal Medicine

## 2014-10-14 ENCOUNTER — Other Ambulatory Visit: Payer: Self-pay | Admitting: General Surgery

## 2014-10-19 ENCOUNTER — Telehealth: Payer: Self-pay | Admitting: Internal Medicine

## 2014-10-19 NOTE — Telephone Encounter (Signed)
Pharmacy had sent a message regarding changing the brand of her coumadin.  Please let pt know that her pharmacy can no longer get the brand of her coumadin (generic) that they have been getting.  We will either need to go to name brand coumadin or change to the generic brand they can get.  Changing could possibly affect her level.  We will need to monitor.  She has a pt/inr scheduled for 10/27/14.  Keep appt.  Will need to let pharmacy know what she wants to do.

## 2014-10-20 NOTE — Telephone Encounter (Signed)
Pt notified, prefers to stay with a generic brand.  verbalized understanding regarding lab appt. Pharmacy notified.

## 2014-10-27 ENCOUNTER — Other Ambulatory Visit (INDEPENDENT_AMBULATORY_CARE_PROVIDER_SITE_OTHER): Payer: Medicare Other

## 2014-10-27 DIAGNOSIS — Z7901 Long term (current) use of anticoagulants: Secondary | ICD-10-CM

## 2014-10-27 LAB — PROTIME-INR
INR: 1.9 ratio — ABNORMAL HIGH (ref 0.8–1.0)
Prothrombin Time: 20.5 s — ABNORMAL HIGH (ref 9.6–13.1)

## 2014-11-18 ENCOUNTER — Ambulatory Visit: Payer: Self-pay | Admitting: Nurse Practitioner

## 2014-11-18 ENCOUNTER — Ambulatory Visit (INDEPENDENT_AMBULATORY_CARE_PROVIDER_SITE_OTHER): Payer: Medicare Other | Admitting: Nurse Practitioner

## 2014-11-18 ENCOUNTER — Other Ambulatory Visit (INDEPENDENT_AMBULATORY_CARE_PROVIDER_SITE_OTHER): Payer: Medicare Other

## 2014-11-18 ENCOUNTER — Encounter: Payer: Self-pay | Admitting: Nurse Practitioner

## 2014-11-18 ENCOUNTER — Other Ambulatory Visit: Payer: Self-pay | Admitting: Internal Medicine

## 2014-11-18 VITALS — BP 110/68 | HR 73 | Temp 97.9°F | Resp 14 | Ht 65.0 in | Wt 180.0 lb

## 2014-11-18 DIAGNOSIS — M25562 Pain in left knee: Secondary | ICD-10-CM | POA: Diagnosis not present

## 2014-11-18 DIAGNOSIS — S8992XA Unspecified injury of left lower leg, initial encounter: Secondary | ICD-10-CM | POA: Diagnosis not present

## 2014-11-18 DIAGNOSIS — Z7901 Long term (current) use of anticoagulants: Secondary | ICD-10-CM

## 2014-11-18 DIAGNOSIS — M1712 Unilateral primary osteoarthritis, left knee: Secondary | ICD-10-CM | POA: Diagnosis not present

## 2014-11-18 LAB — PROTIME-INR
INR: 1.6 ratio — ABNORMAL HIGH (ref 0.8–1.0)
Prothrombin Time: 18 s — ABNORMAL HIGH (ref 9.6–13.1)

## 2014-11-18 NOTE — Patient Instructions (Signed)
We will let you know the results.   Keep trying the Voltaren gel and essential oils.

## 2014-11-18 NOTE — Progress Notes (Signed)
Pre visit review using our clinic review tool, if applicable. No additional management support is needed unless otherwise documented below in the visit note. 

## 2014-11-18 NOTE — Progress Notes (Signed)
Subjective:    Patient ID: Kerri Mills, female    DOB: [birth year], 81 y.o.   MRN: [id number]  HPI  [name] is a 21 with a CC of left knee pain since last Fall.   1) Left knee pain began years ago, but worsened in Fall 2015. Patient points to left superior patella and ran finger laterally to inferior patella where the pain is. Denies radiation. She reports it comes on suddenly and lasts for several minutes. She describes pain as sharp, it is worse with walking, voltaren gel and essential oils are helpful, severity 1/10 at least to 10/10 at worst. She has had past knee injections on the left and saw Ortho in [place] in the past.    Review of Systems  Constitutional: Negative for fever, chills, diaphoresis and fatigue.  Respiratory: Negative for chest tightness, shortness of breath and wheezing.   Cardiovascular: Negative for chest pain, palpitations and leg swelling.  Gastrointestinal: Negative for nausea, vomiting and diarrhea.  Musculoskeletal: Positive for arthralgias and gait problem. Negative for myalgias, back pain, joint swelling, neck pain and neck stiffness.       Left knee pain, when painful she has to change her gait  Skin: Negative for rash.  Neurological: Negative for dizziness, weakness, numbness and headaches.  Psychiatric/Behavioral: The patient is not nervous/anxious.    Past Medical History  Diagnosis Date  . Hypertension   . Gastric ulcer   . Heart disease   . GERD (gastroesophageal reflux disease)   . Hypercholesterolemia   . Seroma     HISTORY OF LFT BREAST  . Anxiety   . Melanoma in situ of upper extremity 03/19/2011  . Diverticulitis 2013  . Sleep apnea   . Arthritis   . Depression   . Allergy   . Colitis   . Breast CA   . Infiltrating lobular carcinoma of left breast 03/19/2011    T2,N0, ER: 90%; PR 0%; Her 2 neu not amplified. [facility]).  . Thyroid cancer 03/19/2011    History   Social History  . Marital Status: Widowed    Spouse  Name: N/A  . Number of Children: N/A  . Years of Education: N/A   Occupational History  . Not on file.   Social History Main Topics  . Smoking status: Never Smoker   . Smokeless tobacco: Never Used  . Alcohol Use: 0.0 oz/week    0 Standard drinks or equivalent per week     Comment: social drinking. average times a week  . Drug Use: No  . Sexual Activity: Not on file   Other Topics Concern  . Not on file   Social History Narrative    Past Surgical History  Procedure Laterality Date  . Tonsillectomy    . Cholecystectomy    . Melanoma excision      RT UPPER ARM  . Thyroid surgery      FOR THYROID CANCER  . Partial hysterectomy      bleeding, ovaries in place.    . Colonoscopy  2013  . Breast lumpectomy  2011    left breast  . Abdominal hysterectomy  1973    partial    Family History  Problem Relation Age of Onset  . Heart disease Mother   . Cancer Brother     lung     Allergies  Allergen Reactions  . Lipitor [Atorvastatin Calcium] Other (See Comments)    Stiffness & soreness  . Penicillins Rash  REACTION: Unknown reaction    Current Outpatient Prescriptions on File Prior to Visit  Medication Sig Dispense Refill  . anastrozole (ARIMIDEX) 1 MG tablet TAKE ONE TABLET BY MOUTH ONCE DAILY 90 tablet 0  . Cholecalciferol (VITAMIN D) 1000 UNITS capsule Take 1,000 Units by mouth daily.      . DULoxetine (CYMBALTA) 60 MG capsule TAKE 1 CAPSULE AT BEDTIME 90 capsule 1  . gabapentin (NEURONTIN) 300 MG capsule TAKE 2 CAPSULES AT BEDTIME 180 capsule 1  . levothyroxine (SYNTHROID, LEVOTHROID) 88 MCG tablet TAKE ONE TABLET BY MOUTH ONCE DAILY BEFORE BREAKFAST 30 tablet 10  . losartan-hydrochlorothiazide (HYZAAR) 100-12.5 MG per tablet TAKE ONE TABLET BY MOUTH ONCE DAILY 30 tablet 5  . Multiple Vitamins-Minerals (PRESERVISION AREDS 2 PO) Take 2 tablets by mouth daily.      Marland Kitchen omeprazole (PRILOSEC) 20 MG capsule Take 1 capsule (20 mg total) by mouth daily. 60 capsule 4  .  propranolol (INDERAL) 10 MG tablet TAKE ONE TABLET BY MOUTH ONCE DAILY AS NEEDED 30 tablet 5  . warfarin (COUMADIN) 3 MG tablet TAKE ONE TABLET BY MOUTH ONCE DAILY. 30 tablet 5   No current facility-administered medications on file prior to visit.      Objective:   Physical Exam  Constitutional: She is oriented to person, place, and time. She appears well-developed and well-nourished. No distress.  BP 110/68 mmHg  Pulse 73  Temp(Src) 97.9 F (36.6 C) (Oral)  Resp 14  Ht 5\' 5"  (1.651 m)  Wt 180 lb (81.647 kg)  BMI 29.95 kg/m2  SpO2 95%   HENT:  Head: Normocephalic and atraumatic.  Right Ear: External ear normal.  Left Ear: External ear normal.  Eyes: Right eye exhibits no discharge. Left eye exhibits no discharge. No scleral icterus.  Cardiovascular: Normal rate, regular rhythm, normal heart sounds and intact distal pulses.  Exam reveals no gallop and no friction rub.   No murmur heard. Pulmonary/Chest: Effort normal and breath sounds normal. No respiratory distress. She has no wheezes. She has no rales. She exhibits no tenderness.  Musculoskeletal: She exhibits tenderness. She exhibits no edema.       Left knee: She exhibits bony tenderness. She exhibits normal range of motion, no swelling, no effusion, no ecchymosis, no deformity, no laceration, no erythema, normal alignment, no LCL laxity, normal patellar mobility, normal meniscus and no MCL laxity. Tenderness found. Lateral joint line tenderness noted. No medial joint line, no MCL, no LCL and no patellar tendon tenderness noted.       Legs: Neurological: She is alert and oriented to person, place, and time. No cranial nerve deficit. She exhibits normal muscle tone. Coordination normal.  Skin: Skin is warm and dry. No rash noted. She is not diaphoretic.  Psychiatric: She has a normal mood and affect. Her behavior is normal. Judgment and thought content normal.      Assessment & Plan:

## 2014-11-19 DIAGNOSIS — M25562 Pain in left knee: Secondary | ICD-10-CM | POA: Insufficient documentation

## 2014-11-19 NOTE — Assessment & Plan Note (Signed)
Worsening. Will continue with treatment as the voltaren gel and essential oils combination. Obtain L knee x-ray complete from [facility]. Will obtain results and decide course of treatment.

## 2014-11-19 NOTE — Addendum Note (Signed)
Addended by: Johnsie Cancel on: 11/19/2014 03:53 PM   Modules accepted: Orders

## 2014-11-21 ENCOUNTER — Telehealth: Payer: Self-pay | Admitting: Nurse Practitioner

## 2014-11-21 ENCOUNTER — Other Ambulatory Visit: Payer: Self-pay | Admitting: Nurse Practitioner

## 2014-11-21 ENCOUNTER — Telehealth: Payer: Self-pay

## 2014-11-21 DIAGNOSIS — M25562 Pain in left knee: Secondary | ICD-10-CM

## 2014-11-21 NOTE — Telephone Encounter (Signed)
Patient is calling for the results of her lab work °

## 2014-11-21 NOTE — Telephone Encounter (Signed)
-----   Message from Rubbie Battiest, NP sent at 11/21/2014  4:52 PM EST ----- Osteoarthritis of left knee. We can refer to orthopedics for injections to help with knee pain if interested. Thanks!

## 2014-11-21 NOTE — Telephone Encounter (Signed)
Patient notified of results. Patient verbalized understanding

## 2014-11-21 NOTE — Telephone Encounter (Signed)
Spoke to patient to notify her of Carrie's comments. Patient verbalized understanding. Patient stated that she would like a referral to see Dr. Marry Guan at [facility] in [place].

## 2014-11-25 ENCOUNTER — Other Ambulatory Visit (INDEPENDENT_AMBULATORY_CARE_PROVIDER_SITE_OTHER): Payer: Medicare Other

## 2014-11-25 DIAGNOSIS — Z7901 Long term (current) use of anticoagulants: Secondary | ICD-10-CM

## 2014-11-25 LAB — PROTIME-INR
INR: 2 ratio — AB (ref 0.8–1.0)
Prothrombin Time: 21.8 s — ABNORMAL HIGH (ref 9.6–13.1)

## 2014-11-26 ENCOUNTER — Other Ambulatory Visit: Payer: Medicare Other

## 2014-12-05 DIAGNOSIS — M25562 Pain in left knee: Secondary | ICD-10-CM | POA: Diagnosis not present

## 2014-12-05 DIAGNOSIS — M1712 Unilateral primary osteoarthritis, left knee: Secondary | ICD-10-CM | POA: Diagnosis not present

## 2014-12-08 ENCOUNTER — Other Ambulatory Visit (INDEPENDENT_AMBULATORY_CARE_PROVIDER_SITE_OTHER): Payer: Medicare Other

## 2014-12-08 DIAGNOSIS — Z7901 Long term (current) use of anticoagulants: Secondary | ICD-10-CM | POA: Diagnosis not present

## 2014-12-08 LAB — PROTIME-INR
INR: 2.4 ratio — ABNORMAL HIGH (ref 0.8–1.0)
Prothrombin Time: 25.7 s — ABNORMAL HIGH (ref 9.6–13.1)

## 2014-12-18 DIAGNOSIS — H3531 Nonexudative age-related macular degeneration: Secondary | ICD-10-CM | POA: Diagnosis not present

## 2014-12-22 ENCOUNTER — Other Ambulatory Visit (INDEPENDENT_AMBULATORY_CARE_PROVIDER_SITE_OTHER): Payer: Medicare Other

## 2014-12-22 DIAGNOSIS — Z7901 Long term (current) use of anticoagulants: Secondary | ICD-10-CM

## 2014-12-22 LAB — PROTIME-INR
INR: 1.9 ratio — AB (ref 0.8–1.0)
PROTHROMBIN TIME: 20.7 s — AB (ref 9.6–13.1)

## 2014-12-31 ENCOUNTER — Other Ambulatory Visit: Payer: Self-pay | Admitting: Internal Medicine

## 2014-12-31 ENCOUNTER — Other Ambulatory Visit: Payer: Self-pay | Admitting: *Deleted

## 2014-12-31 DIAGNOSIS — E038 Other specified hypothyroidism: Secondary | ICD-10-CM

## 2014-12-31 DIAGNOSIS — Z7901 Long term (current) use of anticoagulants: Secondary | ICD-10-CM

## 2014-12-31 MED ORDER — LOSARTAN POTASSIUM-HCTZ 100-12.5 MG PO TABS: 1.0000 | ORAL_TABLET | Freq: Every day | ORAL | Status: DC

## 2014-12-31 MED ORDER — PROPRANOLOL HCL 10 MG PO TABS: ORAL_TABLET | ORAL | Status: DC

## 2014-12-31 MED ORDER — LOVASTATIN 40 MG PO TABS: 40.0000 mg | ORAL_TABLET | Freq: Every day | ORAL | Status: DC

## 2014-12-31 MED ORDER — OMEPRAZOLE 20 MG PO CPDR: 20.0000 mg | DELAYED_RELEASE_CAPSULE | Freq: Every day | ORAL | Status: DC

## 2014-12-31 MED ORDER — WARFARIN SODIUM 3 MG PO TABS: 3.0000 mg | ORAL_TABLET | Freq: Every day | ORAL | Status: DC

## 2014-12-31 NOTE — Telephone Encounter (Signed)
Yes and thank you

## 2014-12-31 NOTE — Telephone Encounter (Signed)
Pt called, stating [facility] would be sending refill requests for Duloxetine and Gabapentin. Advised her they have been refilled this morning.  verbalized understanding

## 2014-12-31 NOTE — Telephone Encounter (Signed)
Refill request from pharmacy for levothyroxine. Last TSH 08/13/13, can I add TSH to labs she's having drawn tomorrow, 01/01/15?

## 2014-12-31 NOTE — Telephone Encounter (Signed)
Labs ordered, hold refill until results

## 2015-01-01 ENCOUNTER — Other Ambulatory Visit (INDEPENDENT_AMBULATORY_CARE_PROVIDER_SITE_OTHER): Payer: Medicare Other

## 2015-01-01 DIAGNOSIS — E038 Other specified hypothyroidism: Secondary | ICD-10-CM

## 2015-01-01 DIAGNOSIS — Z7901 Long term (current) use of anticoagulants: Secondary | ICD-10-CM

## 2015-01-01 LAB — PROTIME-INR
INR: 2.6 ratio — ABNORMAL HIGH (ref 0.8–1.0)
Prothrombin Time: 27.7 s — ABNORMAL HIGH (ref 9.6–13.1)

## 2015-01-01 LAB — TSH: TSH: 2.43 u[IU]/mL (ref 0.35–4.50)

## 2015-01-01 MED ORDER — LEVOTHYROXINE SODIUM 88 MCG PO TABS: ORAL_TABLET | ORAL | Status: DC

## 2015-01-01 NOTE — Telephone Encounter (Signed)
TSH normal. Rx sent to pharmacy by [facility]

## 2015-01-08 ENCOUNTER — Other Ambulatory Visit (INDEPENDENT_AMBULATORY_CARE_PROVIDER_SITE_OTHER): Payer: Medicare Other

## 2015-01-08 ENCOUNTER — Telehealth: Payer: Self-pay | Admitting: *Deleted

## 2015-01-08 DIAGNOSIS — Z7901 Long term (current) use of anticoagulants: Secondary | ICD-10-CM | POA: Diagnosis not present

## 2015-01-08 LAB — PROTIME-INR
INR: 2 ratio — AB (ref 0.8–1.0)
PROTHROMBIN TIME: 21.5 s — AB (ref 9.6–13.1)

## 2015-01-08 NOTE — Telephone Encounter (Signed)
Noted.  Will f/u with lab results.

## 2015-01-08 NOTE — Telephone Encounter (Signed)
Pt says she forgot to take the 1/2 tablet last night

## 2015-01-12 ENCOUNTER — Encounter: Payer: Self-pay | Admitting: General Surgery

## 2015-01-12 ENCOUNTER — Ambulatory Visit: Admit: 2015-01-12 | Disposition: A | Discharge status: Home / Self Care | Payer: Self-pay | Admitting: General Surgery

## 2015-01-12 DIAGNOSIS — Z9889 Other specified postprocedural states: Secondary | ICD-10-CM | POA: Diagnosis not present

## 2015-01-12 DIAGNOSIS — N63 Unspecified lump in breast: Secondary | ICD-10-CM | POA: Diagnosis not present

## 2015-01-12 DIAGNOSIS — Z853 Personal history of malignant neoplasm of breast: Secondary | ICD-10-CM | POA: Diagnosis not present

## 2015-01-21 ENCOUNTER — Other Ambulatory Visit: Payer: Medicare Other

## 2015-01-21 ENCOUNTER — Ambulatory Visit (INDEPENDENT_AMBULATORY_CARE_PROVIDER_SITE_OTHER): Payer: Medicare Other | Admitting: General Surgery

## 2015-01-21 ENCOUNTER — Encounter: Payer: Self-pay | Admitting: General Surgery

## 2015-01-21 VITALS — BP 126/80 | HR 66 | Resp 13 | Ht 65.0 in | Wt 191.0 lb

## 2015-01-21 DIAGNOSIS — R928 Other abnormal and inconclusive findings on diagnostic imaging of breast: Secondary | ICD-10-CM | POA: Insufficient documentation

## 2015-01-21 DIAGNOSIS — N6032 Fibrosclerosis of left breast: Secondary | ICD-10-CM | POA: Diagnosis not present

## 2015-01-21 DIAGNOSIS — N6320 Unspecified lump in the left breast, unspecified quadrant: Secondary | ICD-10-CM

## 2015-01-21 HISTORY — PX: BREAST BIOPSY: SHX20

## 2015-01-21 NOTE — Progress Notes (Signed)
Patient ID: Kerri Mills, female   DOB: Nov 16, 1930, 81 y.o.   MRN: [id number]  Chief Complaint  Patient presents with  . Follow-up    mammogram    HPI Kerri Mills is a 81 y.o. female.  who presents for her follow up breast cancer and breast evaluation. The most recent mammogram was done on 01-12-15 as well as a left breast ultrasound.  Patient does perform regular self breast checks and gets regular mammograms done. She is still taking her Arimidex. She has completed 5 years of antiestrogen therapy.  HPI  Past Medical History  Diagnosis Date  . Hypertension   . Gastric ulcer   . Heart disease   . GERD (gastroesophageal reflux disease)   . Hypercholesterolemia   . Seroma     HISTORY OF LFT BREAST  . Anxiety   . Melanoma in situ of upper extremity 03/19/2011  . Diverticulitis 2013  . Sleep apnea   . Arthritis   . Depression   . Allergy   . Colitis   . Breast CA   . Infiltrating lobular carcinoma of left breast 03/19/2011    T2,N0, ER: 90%; PR 0%; Her 2 neu not amplified. [facility]).  . Thyroid cancer 03/19/2011    Past Surgical History  Procedure Laterality Date  . Tonsillectomy    . Cholecystectomy    . Melanoma excision      RT UPPER ARM  . Thyroid surgery      FOR THYROID CANCER  . Partial hysterectomy      bleeding, ovaries in place.    . Colonoscopy  2013  . Abdominal hysterectomy  1973    partial  . Breast lumpectomy  2011    left breast  . Breast biopsy Left 02-13-13    BENIGN BREAST TISSUE WITH CHANGES CONSISTENT WITH FAT NECROSIS    Family History  Problem Relation Age of Onset  . Heart disease Mother   . Cancer Brother     lung     Social History History  Substance Use Topics  . Smoking status: Never Smoker   . Smokeless tobacco: Never Used  . Alcohol Use: 0.0 oz/week    0 Standard drinks or equivalent per week     Comment: social drinking. average times a week    Allergies  Allergen Reactions  . Lipitor [Atorvastatin Calcium]  Other (See Comments)    Stiffness & soreness  . Penicillins Rash    REACTION: Unknown reaction    Current Outpatient Prescriptions  Medication Sig Dispense Refill  . anastrozole (ARIMIDEX) 1 MG tablet TAKE ONE TABLET BY MOUTH ONCE DAILY 90 tablet 0  . Cholecalciferol (VITAMIN D) 1000 UNITS capsule Take 1,000 Units by mouth daily.      . DULoxetine (CYMBALTA) 60 MG capsule TAKE 1 CAPSULE AT BEDTIME 90 capsule 0  . gabapentin (NEURONTIN) 300 MG capsule TAKE 2 CAPSULES AT BEDTIME 180 capsule 1  . levothyroxine (SYNTHROID, LEVOTHROID) 88 MCG tablet TAKE ONE TABLET BY MOUTH ONCE DAILY BEFORE BREAKFAST 90 tablet 3  . losartan-hydrochlorothiazide (HYZAAR) 100-12.5 MG per tablet Take 1 tablet by mouth daily. 90 tablet 1  . lovastatin (MEVACOR) 40 MG tablet Take 1 tablet (40 mg total) by mouth daily. 90 tablet 1  . Multiple Vitamins-Minerals (PRESERVISION AREDS 2 PO) Take 2 tablets by mouth daily.      Marland Kitchen omeprazole (PRILOSEC) 20 MG capsule Take 1 capsule (20 mg total) by mouth daily. 90 capsule 1  . propranolol (INDERAL) 10  MG tablet TAKE ONE TABLET BY MOUTH ONCE DAILY AS NEEDED 90 tablet 1  . warfarin (COUMADIN) 3 MG tablet Take 1 tablet (3 mg total) by mouth daily. 90 tablet 1   No current facility-administered medications for this visit.    Review of Systems Review of Systems  Constitutional: Negative.   Respiratory: Negative.   Cardiovascular: Negative.     Blood pressure 126/80, pulse 66, resp. rate 13, height 5\' 5"  (1.651 m), weight 191 lb (86.637 kg).  Physical Exam Physical Exam  Constitutional: She is oriented to person, place, and time. She appears well-developed and well-nourished.  Neck: Neck supple.  Cardiovascular: Normal rate, regular rhythm and normal heart sounds.   Pulmonary/Chest: Effort normal and breath sounds normal. Right breast exhibits no inverted nipple, no mass, no nipple discharge, no skin change and no tenderness. Left breast exhibits no inverted nipple, no  mass, no nipple discharge, no skin change and no tenderness.  Left breast talactagia along bra lone. Significant scarring upper pouter quadrant from previous surgery and radiation.  Lymphadenopathy:    She has no cervical adenopathy.    She has no axillary adenopathy.  Neurological: She is alert and oriented to person, place, and time.  Skin: Skin is warm and dry.    Data Reviewed Bilateral mammograms dated 01/12/2015 were reviewed. Reported new development of an mass in the upper inner quadrant and lower inner quadrant. Ultrasound showed calcifications within the lesions. BI-RADS-4.  Ultrasound examination of the breast was completed after discussion with the patient about the possibility of repeat biopsy. There is no clinical finding to correlate with the mammographic abnormalities. The identification of multiple lesions is more suggestive of ongoing fat necrosis and malignancy.  The 11:00 position a 0.6 x 0.65 x 0.8 cm irregular hypoechoic area with evidence of calcifications is appreciated 6-8 cm from the nipple. At the 7:00 position 4 cm from the nipple a 0.3 x 0.3 x 0.5 cm hypoechoic mass within shadowing is appreciated.  The patient was amenable to vacuum biopsy. 10 mL of 0.5% Xylocaine with 0.25% Marcaine with 1-200,000 of epinephrine was utilized well tolerated. A 10 minute  dwell time was allowed to minimize the risk of bleeding for this patient on anticoagulation.  A 14-gauge Finesse biopsy device was used and multiple core samples obtained with a postbiopsy clip placed. 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    Plan    The patient will be contacted with her biopsy results. If a benign results obtained we'll plan for a follow-up examination in 6 months with repeat ultrasound of the 11:00 lesion.    Complete supply of Arimidex that she has then  discontinue.  Patient will be asked to return to the office in one year with a bilateral diagnostic mammogram.    PCP:  Nash Shearer 01/21/2015, 8:09 PM

## 2015-01-21 NOTE — Patient Instructions (Addendum)
Continue self breast exams. Call office for any new breast issues or concerns.  Complete supply of Arimidex that she has then discontinue.     CARE AFTER BREAST BIOPSY  1. Leave the dressing on that your doctor applied after surgery. It is waterproof. You may bathe, shower and/or swim. The dressing will probably remain intact until your return office visit. If the dressing comes off, you will see small strips of tape against your skin on the incision. Do not remove these strips.  2. You may want to use a gauze,cloth or similar protection in your bra to prevent rubbing against your dressing and incision. This is not necessary, but you may feel more comfortable doing so.  3. It is recommended that you wear a bra day and night to give support to the breast. This will prevent the weight of the breast from pulling on the incision.  4. Your breast will feel hard and lumpy under the incision. Do not be alarmed. This is the underlying stitching of tissue. Softening of this tissue will occur in time.  5. Make sure you call the office and schedule an appointment in one week after your surgery. The office phone number is [phone]. The nurses at [facility] may have already done this for you.  6. You will notice about a week after your office visit that the strips of the tape on your incision will begin to loosen. These may then be removed.  7. Report to your doctor any of the following:  * Severe pain not relieved by your pain medication  *Redness of the incision  * Drainage from the incision  *Fever greater than 101 degrees

## 2015-02-02 ENCOUNTER — Other Ambulatory Visit (INDEPENDENT_AMBULATORY_CARE_PROVIDER_SITE_OTHER): Payer: Medicare Other

## 2015-02-02 DIAGNOSIS — Z7901 Long term (current) use of anticoagulants: Secondary | ICD-10-CM

## 2015-02-02 LAB — PROTIME-INR
INR: 1.7 ratio — ABNORMAL HIGH (ref 0.8–1.0)
Prothrombin Time: 18.8 s — ABNORMAL HIGH (ref 9.6–13.1)

## 2015-02-09 ENCOUNTER — Other Ambulatory Visit (INDEPENDENT_AMBULATORY_CARE_PROVIDER_SITE_OTHER): Payer: Medicare Other

## 2015-02-09 DIAGNOSIS — Z7901 Long term (current) use of anticoagulants: Secondary | ICD-10-CM

## 2015-02-09 LAB — PROTIME-INR
INR: 2 ratio — AB (ref 0.8–1.0)
Prothrombin Time: 21.8 s — ABNORMAL HIGH (ref 9.6–13.1)

## 2015-02-10 ENCOUNTER — Other Ambulatory Visit: Payer: Medicare Other

## 2015-02-24 ENCOUNTER — Other Ambulatory Visit (INDEPENDENT_AMBULATORY_CARE_PROVIDER_SITE_OTHER): Payer: Medicare Other

## 2015-02-24 DIAGNOSIS — Z7901 Long term (current) use of anticoagulants: Secondary | ICD-10-CM

## 2015-02-25 LAB — PROTIME-INR
INR: 2.1 ratio — AB (ref 0.8–1.0)
Prothrombin Time: 22.5 s — ABNORMAL HIGH (ref 9.6–13.1)

## 2015-03-15 DIAGNOSIS — H18832 Recurrent erosion of cornea, left eye: Secondary | ICD-10-CM | POA: Diagnosis not present

## 2015-03-16 DIAGNOSIS — H18832 Recurrent erosion of cornea, left eye: Secondary | ICD-10-CM | POA: Diagnosis not present

## 2015-03-17 DIAGNOSIS — H18832 Recurrent erosion of cornea, left eye: Secondary | ICD-10-CM | POA: Diagnosis not present

## 2015-03-19 ENCOUNTER — Other Ambulatory Visit (INDEPENDENT_AMBULATORY_CARE_PROVIDER_SITE_OTHER): Payer: Medicare Other

## 2015-03-19 DIAGNOSIS — H18832 Recurrent erosion of cornea, left eye: Secondary | ICD-10-CM | POA: Diagnosis not present

## 2015-03-19 DIAGNOSIS — Z7901 Long term (current) use of anticoagulants: Secondary | ICD-10-CM

## 2015-03-19 LAB — PROTIME-INR
INR: 2 ratio — ABNORMAL HIGH (ref 0.8–1.0)
PROTHROMBIN TIME: 22.3 s — AB (ref 9.6–13.1)

## 2015-03-23 DIAGNOSIS — H18832 Recurrent erosion of cornea, left eye: Secondary | ICD-10-CM | POA: Diagnosis not present

## 2015-04-13 DIAGNOSIS — H18832 Recurrent erosion of cornea, left eye: Secondary | ICD-10-CM | POA: Diagnosis not present

## 2015-04-16 ENCOUNTER — Other Ambulatory Visit (INDEPENDENT_AMBULATORY_CARE_PROVIDER_SITE_OTHER): Payer: Medicare Other

## 2015-04-16 DIAGNOSIS — Z7901 Long term (current) use of anticoagulants: Secondary | ICD-10-CM

## 2015-04-16 LAB — PROTIME-INR
INR: 2.6 ratio — ABNORMAL HIGH (ref 0.8–1.0)
PROTHROMBIN TIME: 28.4 s — AB (ref 9.6–13.1)

## 2015-04-17 ENCOUNTER — Other Ambulatory Visit: Payer: Medicare Other

## 2015-04-27 ENCOUNTER — Other Ambulatory Visit (INDEPENDENT_AMBULATORY_CARE_PROVIDER_SITE_OTHER): Payer: Medicare Other

## 2015-04-27 DIAGNOSIS — Z7901 Long term (current) use of anticoagulants: Secondary | ICD-10-CM

## 2015-04-27 LAB — PROTIME-INR
INR: 2.9 ratio — AB (ref 0.8–1.0)
PROTHROMBIN TIME: 31 s — AB (ref 9.6–13.1)

## 2015-04-28 ENCOUNTER — Telehealth: Payer: Self-pay

## 2015-04-28 NOTE — Telephone Encounter (Signed)
PA for Prilosec started, [facility]ed [facility].  Awaiting results.

## 2015-05-04 ENCOUNTER — Telehealth: Payer: Self-pay | Admitting: *Deleted

## 2015-05-04 DIAGNOSIS — C73 Malignant neoplasm of thyroid gland: Secondary | ICD-10-CM

## 2015-05-04 DIAGNOSIS — R739 Hyperglycemia, unspecified: Secondary | ICD-10-CM

## 2015-05-04 DIAGNOSIS — I4891 Unspecified atrial fibrillation: Secondary | ICD-10-CM

## 2015-05-04 DIAGNOSIS — D0360 Melanoma in situ of unspecified upper limb, including shoulder: Secondary | ICD-10-CM

## 2015-05-04 DIAGNOSIS — Z853 Personal history of malignant neoplasm of breast: Secondary | ICD-10-CM

## 2015-05-04 NOTE — Telephone Encounter (Signed)
Need orders placed for PT/INR- Has lab appt tomorrow. Thanks

## 2015-05-04 NOTE — Telephone Encounter (Signed)
Order placed for labs.

## 2015-05-05 ENCOUNTER — Other Ambulatory Visit (INDEPENDENT_AMBULATORY_CARE_PROVIDER_SITE_OTHER): Payer: Medicare Other

## 2015-05-05 DIAGNOSIS — R739 Hyperglycemia, unspecified: Secondary | ICD-10-CM | POA: Diagnosis not present

## 2015-05-05 DIAGNOSIS — D4989 Neoplasm of unspecified behavior of other specified sites: Secondary | ICD-10-CM | POA: Diagnosis not present

## 2015-05-05 DIAGNOSIS — D0360 Melanoma in situ of unspecified upper limb, including shoulder: Secondary | ICD-10-CM

## 2015-05-05 DIAGNOSIS — C73 Malignant neoplasm of thyroid gland: Secondary | ICD-10-CM

## 2015-05-05 DIAGNOSIS — I4891 Unspecified atrial fibrillation: Secondary | ICD-10-CM | POA: Diagnosis not present

## 2015-05-05 DIAGNOSIS — L570 Actinic keratosis: Secondary | ICD-10-CM | POA: Diagnosis not present

## 2015-05-05 DIAGNOSIS — L718 Other rosacea: Secondary | ICD-10-CM | POA: Diagnosis not present

## 2015-05-05 DIAGNOSIS — Z853 Personal history of malignant neoplasm of breast: Secondary | ICD-10-CM | POA: Diagnosis not present

## 2015-05-05 LAB — BASIC METABOLIC PANEL
BUN: 9 mg/dL (ref 6–23)
CHLORIDE: 104 meq/L (ref 96–112)
CO2: 31 mEq/L (ref 19–32)
CREATININE: 0.84 mg/dL (ref 0.40–1.20)
Calcium: 9.4 mg/dL (ref 8.4–10.5)
GFR: 69.02 mL/min (ref >60.00)
Glucose, Bld: 141 mg/dL — ABNORMAL HIGH (ref 70–99)
POTASSIUM: 3.8 meq/L (ref 3.5–5.1)
Sodium: 139 mEq/L (ref 135–145)

## 2015-05-05 LAB — HEPATIC FUNCTION PANEL
ALT: 12 U/L (ref 0–35)
AST: 16 U/L (ref 0–37)
Albumin: 3.8 g/dL (ref 3.5–5.2)
Alkaline Phosphatase: 99 U/L (ref 39–117)
BILIRUBIN DIRECT: 0 mg/dL (ref 0.0–0.3)
BILIRUBIN TOTAL: 0.3 mg/dL (ref 0.2–1.2)
Total Protein: 6.5 g/dL (ref 6.0–8.3)

## 2015-05-05 LAB — CBC WITH DIFFERENTIAL/PLATELET
Basophils Absolute: 0 10*3/uL (ref 0.0–0.1)
Basophils Relative: 0.7 % (ref 0.0–3.0)
Eosinophils Absolute: 0.1 10*3/uL (ref 0.0–0.7)
Eosinophils Relative: 2 % (ref 0.0–5.0)
HEMATOCRIT: 36.7 % (ref 36.0–46.0)
Hemoglobin: 12.1 g/dL (ref 12.0–15.0)
LYMPHS ABS: 1.1 10*3/uL (ref 0.7–4.0)
Lymphocytes Relative: 24.2 % (ref 12.0–46.0)
MCHC: 32.9 g/dL (ref 30.0–36.0)
MCV: 92.2 fl (ref 78.0–100.0)
MONOS PCT: 8.5 % (ref 3.0–12.0)
Monocytes Absolute: 0.4 10*3/uL (ref 0.1–1.0)
Neutro Abs: 2.9 10*3/uL (ref 1.4–7.7)
Neutrophils Relative %: 64.6 % (ref 43.0–77.0)
Platelets: 214 10*3/uL (ref 150.0–400.0)
RBC: 3.98 Mil/uL (ref 3.87–5.11)
RDW: 12.9 % (ref 11.5–15.5)
WBC: 4.5 10*3/uL (ref 4.0–10.5)

## 2015-05-05 LAB — HEMOGLOBIN A1C: HEMOGLOBIN A1C: 6 % (ref 4.6–6.5)

## 2015-05-05 LAB — PROTIME-INR
INR: 2.7 ratio — ABNORMAL HIGH (ref 0.8–1.0)
Prothrombin Time: 28.9 s — ABNORMAL HIGH (ref 9.6–13.1)

## 2015-05-05 LAB — TSH: TSH: 4.41 u[IU]/mL (ref 0.35–4.50)

## 2015-05-06 NOTE — Telephone Encounter (Signed)
Received fax from [facility]. A PA is not required for Prilosec OTC. Also received a fax for Omeprazole. Form placed in Dr. Nicki Reaper red folder

## 2015-05-12 ENCOUNTER — Telehealth: Payer: Self-pay | Admitting: *Deleted

## 2015-05-12 ENCOUNTER — Other Ambulatory Visit: Payer: Self-pay | Admitting: *Deleted

## 2015-05-12 MED ORDER — OMEPRAZOLE 20 MG PO CPDR: 20.0000 mg | DELAYED_RELEASE_CAPSULE | Freq: Every day | ORAL | Status: DC

## 2015-05-12 NOTE — Telephone Encounter (Signed)
Patient has requested a medication refill, for omeprazole 20mg . Thanks

## 2015-05-12 NOTE — Telephone Encounter (Signed)
Rx sent 

## 2015-05-27 ENCOUNTER — Other Ambulatory Visit: Payer: Medicare Other

## 2015-05-28 ENCOUNTER — Telehealth: Payer: Self-pay | Admitting: *Deleted

## 2015-05-28 NOTE — Telephone Encounter (Signed)
Pt called states [facility] says the Omeprazole Rx can not be refilled until 10.13.16 unless an override is submitted by MD.  Please advise

## 2015-05-28 NOTE — Telephone Encounter (Signed)
Need to confirm how she has been taking the medication and if has taken any extra doses.  Need more info as to out of medication early.  Thanks

## 2015-05-28 NOTE — Telephone Encounter (Signed)
Spoke with pt she states only takes Omeprazole once a day.  States the last refill was dated 4.18.16 #90.  Please advise

## 2015-05-29 NOTE — Telephone Encounter (Signed)
We have submitted the prescription for refill.  If she last refilled on 12/2014 - I do not see the problem.  Please call [facility] and find out what is needed for her to get her medication.  Thanks

## 2015-05-29 NOTE — Telephone Encounter (Signed)
Called [facility].  PA form to be faxed for completion.

## 2015-06-10 NOTE — Telephone Encounter (Signed)
PA approved for Omeprazole Effective: 05/10/15-06/08/2016. Faxed form to pharmacy.

## 2015-06-10 NOTE — Telephone Encounter (Signed)
Case ID: [id number]

## 2015-06-14 ENCOUNTER — Other Ambulatory Visit: Payer: Self-pay | Admitting: Internal Medicine

## 2015-06-15 NOTE — Telephone Encounter (Signed)
Last OV 11.13.15.  Please advise refill

## 2015-06-15 NOTE — Telephone Encounter (Signed)
Refilled cymbalta #90 with no refills.  Keep f/u appt

## 2015-06-20 ENCOUNTER — Other Ambulatory Visit: Payer: Self-pay | Admitting: Internal Medicine

## 2015-06-22 ENCOUNTER — Telehealth: Payer: Self-pay | Admitting: General Surgery

## 2015-06-22 NOTE — Telephone Encounter (Signed)
PT WOULD LIKE A PRESCRIPTION FOR MASTECTOMY BRA'S. FAXED TO [facility] @ F# [phone](ATTN:DORIS/ [phone]. THE SCRIPT SHOULD READ #2 MASTECTOMY BRAS 3REFILLS/ PT HAS AN APPT 06-23-15 @ 3:00PM)

## 2015-06-22 NOTE — Telephone Encounter (Signed)
Please send requested prescription

## 2015-06-23 NOTE — Telephone Encounter (Signed)
RX faxed

## 2015-06-24 ENCOUNTER — Other Ambulatory Visit: Payer: Self-pay | Admitting: Internal Medicine

## 2015-06-30 DIAGNOSIS — L719 Rosacea, unspecified: Secondary | ICD-10-CM | POA: Diagnosis not present

## 2015-06-30 DIAGNOSIS — L570 Actinic keratosis: Secondary | ICD-10-CM | POA: Diagnosis not present

## 2015-06-30 DIAGNOSIS — L821 Other seborrheic keratosis: Secondary | ICD-10-CM | POA: Diagnosis not present

## 2015-07-01 ENCOUNTER — Ambulatory Visit (INDEPENDENT_AMBULATORY_CARE_PROVIDER_SITE_OTHER): Payer: Medicare Other

## 2015-07-01 VITALS — BP 126/82 | HR 65 | Temp 97.4°F | Resp 14 | Ht 65.0 in | Wt 183.8 lb

## 2015-07-01 DIAGNOSIS — Z0000 Encounter for general adult medical examination without abnormal findings: Secondary | ICD-10-CM | POA: Diagnosis not present

## 2015-07-01 DIAGNOSIS — Z23 Encounter for immunization: Secondary | ICD-10-CM

## 2015-07-01 NOTE — Patient Instructions (Addendum)
Ms. Lockner,  Thank you for taking time to come for your Medicare Wellness Visit.  I appreciate your ongoing commitment to your health goals. Please review the following plan we discussed and let me know if I can assist you in the future.  Bring a copy of HCPOA to be scanned into the chart.  High dose influenza vaccine administered today.  Pneumococcal 23 vaccine administered today.   Hearing Loss Hearing loss is a partial or total loss of the ability to hear. This can be temporary or permanent, and it can happen in one or both ears. Hearing loss may be referred to as deafness. Medical care is necessary to treat hearing loss properly and to prevent the condition from getting worse. Your hearing may partially or completely come back, depending on what caused your hearing loss and how severe it is. In some cases, hearing loss is permanent. CAUSES Common causes of hearing loss include:   Too much wax in the ear canal.   Infection of the ear canal or middle ear.   Fluid in the middle ear.   Injury to the ear or surrounding area.   An object stuck in the ear.   Prolonged exposure to loud sounds, such as music.  Less common causes of hearing loss include:   Tumors in the ear.   Viral or bacterial infections, such as meningitis.   A hole in the eardrum (perforated eardrum).  Problems with the hearing nerve that sends signals between the brain and the ear.  Certain medicines.  SYMPTOMS  Symptoms of this condition may include:  Difficulty telling the difference between sounds.  Difficulty following a conversation when there is background noise.  Lack of response to sounds in your environment. This may be most noticeable when you do not respond to startling sounds.  Needing to turn up the volume on the television, radio, etc.  Ringing in the ears.  Dizziness.  Pain in the ears. DIAGNOSIS This condition is diagnosed based on a physical exam and a hearing test  (audiometry). The audiometry test will be performed by a hearing specialist (audiologist). You may also be referred to an ear, nose, and throat (ENT) specialist (otolaryngologist).  TREATMENT Treatment for recent onset of hearing loss may include:   Ear wax removal.   Being prescribed medicines to prevent infection (antibiotics).   Being prescribed medicines to reduce inflammation (corticosteroids).  HOME CARE INSTRUCTIONS  If you were prescribed an antibiotic medicine, take it as told by your health care provider. Do not stop taking the antibiotic even if you start to feel better.  Take over-the-counter and prescription medicines only as told by your health care provider.  Avoid loud noises.   Return to your normal activities as told by your health care provider. Ask your health care provider what activities are safe for you.  Keep all follow-up visits as told by your health care provider. This is important. SEEK MEDICAL CARE IF:   You feel dizzy.   You develop new symptoms.   You vomit or feel nauseous.   You have a fever.  SEEK IMMEDIATE MEDICAL CARE IF:  You develop sudden changes in your vision.   You have severe ear pain.   You have new or increased weakness.  You have a severe headache.   This information is not intended to replace advice given to you by your health care provider. Make sure you discuss any questions you have with your health care provider.   Document Released:  09/12/2005 Document Revised: 06/03/2015 Document Reviewed: 01/28/2015 Elsevier Interactive Patient Education [employer].

## 2015-07-01 NOTE — Progress Notes (Signed)
Subjective:   Kerri Mills is a 81 y.o. female who presents for an Initial Medicare Annual Wellness Visit.  Review of Systems    No ROS.  Medicare Wellness Visit.   Cardiac Risk Factors include: advanced age (>58men, >35 women)     Objective:    Today's Vitals   07/01/15 1028  BP: 126/82  Pulse: 65  Temp: 97.4 F (36.3 C)  TempSrc: Oral  Resp: 14  Height: 5\' 5"  (1.651 m)  Weight: 183 lb 12.8 oz (83.371 kg)  SpO2: 97%    Current Medications (verified) Outpatient Encounter Prescriptions as of 07/01/2015  Medication Sig  . anastrozole (ARIMIDEX) 1 MG tablet TAKE ONE TABLET BY MOUTH ONCE DAILY  . Cholecalciferol (VITAMIN D) 1000 UNITS capsule Take 1,000 Units by mouth daily.    . DULoxetine (CYMBALTA) 60 MG capsule TAKE 1 CAPSULE AT BEDTIME.  Marland Kitchen gabapentin (NEURONTIN) 300 MG capsule TAKE 2 CAPSULES AT BEDTIME  . levothyroxine (SYNTHROID, LEVOTHROID) 88 MCG tablet TAKE ONE TABLET BY MOUTH ONCE DAILY BEFORE BREAKFAST  . losartan-hydrochlorothiazide (HYZAAR) 100-12.5 MG tablet TAKE 1 TABLET DAILY  . lovastatin (MEVACOR) 40 MG tablet TAKE 1 TABLET DAILY  . Multiple Vitamins-Minerals (PRESERVISION AREDS 2 PO) Take 2 tablets by mouth daily.    Marland Kitchen omeprazole (PRILOSEC) 20 MG capsule Take 1 capsule (20 mg total) by mouth daily.  . propranolol (INDERAL) 10 MG tablet TAKE 1 TABLET DAILY AS NEEDED  . warfarin (COUMADIN) 3 MG tablet TAKE 1 TABLET DAILY   No facility-administered encounter medications on file as of 07/01/2015.    Allergies (verified) Atorvastatin; Lipitor; and Penicillins   History: Past Medical History  Diagnosis Date  . Hypertension   . Gastric ulcer   . Heart disease   . GERD (gastroesophageal reflux disease)   . Hypercholesterolemia   . Seroma     HISTORY OF LFT BREAST  . Anxiety   . Melanoma in situ of upper extremity ([place]) 03/19/2011  . Diverticulitis 2013  . Sleep apnea   . Arthritis   . Depression   . Allergy   . Colitis   . Breast CA ([place])   .  Infiltrating lobular carcinoma of left breast 2011    T2,N0, ER: 90%; PR 0%; Her 2 neu not amplified. [facility]).  . Thyroid cancer ([place]) 03/19/2011   Past Surgical History  Procedure Laterality Date  . Tonsillectomy    . Cholecystectomy    . Melanoma excision      RT UPPER ARM  . Thyroid surgery      FOR THYROID CANCER  . Partial hysterectomy      bleeding, ovaries in place.    . Colonoscopy  2013  . Abdominal hysterectomy  1973    partial  . Breast lumpectomy  2011    left breast  . Breast biopsy Left 02-13-13    BENIGN BREAST TISSUE WITH CHANGES CONSISTENT WITH FAT NECROSIS   Family History  Problem Relation Age of Onset  . Heart disease Mother   . Cancer Brother     lung    Social History   Occupational History  . Not on file.   Social History Main Topics  . Smoking status: Never Smoker   . Smokeless tobacco: Never Used  . Alcohol Use: 0.0 oz/week    0 Standard drinks or equivalent per week     Comment: social drinking. average times a week  . Drug Use: No  . Sexual Activity: No  Tobacco Counseling Counseling given: Not Answered   Activities of Daily Living In your present state of health, do you have any difficulty performing the following activities: 07/01/2015  Hearing? N  Vision? Y  Difficulty concentrating or making decisions? N  Walking or climbing stairs? N  Dressing or bathing? N  Doing errands, shopping? N  Preparing Food and eating ? N  Using the Toilet? N  In the past six months, have you accidently leaked urine? N  Do you have problems with loss of bowel control? N  Managing your Medications? N  Managing your Finances? N  Housekeeping or managing your Housekeeping? N    Immunizations and Health Maintenance Immunization History  Administered Date(s) Administered  . Influenza Split 07/18/2014  . Influenza, High Dose Seasonal PF 07/01/2015  . Influenza,inj,Quad PF,36+ Mos 06/11/2013  . Pneumococcal Conjugate-13 12/18/2013    . Pneumococcal Polysaccharide-23 07/01/2015   Health Maintenance Due  Topic Date Due  . TETANUS/TDAP  06/26/1952  . ZOSTAVAX  06/26/1993    Patient Care Team: Einar Pheasant, MD as PCP - General (Internal Medicine) Robert Bellow, MD (General Surgery) Rocco Serene, MD (Internal Medicine)  Indicate any recent Medical Services you may have received from other than [facility] providers in the past year (date may be approximate).     Assessment:   This is a routine wellness examination for [place].  The goal of the wellness visit is to assist the patient how to close the gaps in care and create a preventative care plan for the patient.   Calcium and Vit D as appropriate/ Osteoporosis risk reviewed.  Taking meds without issues; no barriers identified.  Safety issues reviewed; smoke detectors in the home. Firearms locked in a secure area. Wears seatbelts when driving or riding with others. No violence in the home.  No identified risk were noted; The patient was oriented x 3; appropriate in dress and manner and no objective failures at ADL's or IADL's.   Hearing screening- Fail L ear 4000Hz .  Educational information provided.  No C/O trouble hearing.  Atrial fibrillation- stable and followed by Einar Pheasant, MD (PCP) Infiltrating lobular carcinoma-stable and followed by PCP Malignant neoplasm of thyroid gland-stable and followed by PCP Melanoma in situ of upper extremity-stable and followed by PCP  Hearing/Vision screen  Hearing Screening   Method: Audiometry   125Hz  250Hz  500Hz  1000Hz  2000Hz  4000Hz  8000Hz   Right ear:   Pass Pass Pass Pass   Left ear:   Pass Pass Pass Fail   Vision Screening Comments: Followed by [facility], Dr. Wallace Going and [facility], Dr. Zadie Rhine. Wears glasses. Upcoming eye exam 06/2015.   Dietary issues and exercise activities discussed: Current Exercise Habits:: Home exercise routine, Type of exercise: walking, Time (Minutes):  15, Intensity: Mild  Goals    . Increase physical activity     Currently walks 15 minutes 3 times weekly.  Patient centered goal is to walk the dog more, including increasing walking up to 30 minutes 3 times per week.      Depression Screen PHQ 2/9 Scores 07/01/2015 10/09/2014 08/16/2013 06/11/2013  PHQ - 2 Score 0 0 0 1    Fall Risk Fall Risk  07/01/2015 10/09/2014 08/16/2013 06/11/2013  Falls in the past year? No No No Yes  Number falls in past yr: - - - 1  Injury with Fall? - - - Yes  Risk for fall due to : - - - Other (Comment)  Risk for fall due to (comments): - - -  stumbles about one a year. fell in 2012 & broker her wrist    Cognitive Function: MMSE - Mini Mental State Exam 07/01/2015  Orientation to time 5  Orientation to Place 5  Registration 3  Attention/ Calculation 5  Recall 3  Language- name 2 objects 2  Language- repeat 1  Language- follow 3 step command 3  Language- read & follow direction 1  Write a sentence 1  Copy design 1  Total score 30    Screening Tests Health Maintenance  Topic Date Due  . TETANUS/TDAP  06/26/1952  . ZOSTAVAX  06/26/1993  . MAMMOGRAM  01/12/2016  . INFLUENZA VACCINE  04/26/2016  . DEXA SCAN  Completed  . PNA vac Low Risk Adult  Completed      Plan:    End of life planning was discussed; aging in home or other; plans to return copy of HCPOA/Living Will.  Pneumococcal 23 vaccine and Influenza vaccine administered, tolerated well.  TDAP and ZOSTAVAX to be administered at a later date.  Patient  to discuss CPAP concerns at f/u visit in 1 week.  During the course of the visit, [place] was educated and counseled about the following appropriate screening and preventive services:   Vaccines to include Pneumoccal, Influenza, Hepatitis B, Td, Zostavax, HCV  Electrocardiogram  Cardiovascular disease screening  Colorectal cancer screening  Bone density screening  Diabetes screening  Glaucoma  screening  Mammography/PAP  Nutrition counseling  Smoking cessation counseling  Patient Instructions (the written plan) were given to the patient.    Varney Biles, LPN   [id number]     Note reviewed.  Agree with plan.   Dr Nicki Reaper

## 2015-07-03 ENCOUNTER — Telehealth: Payer: Self-pay | Admitting: Internal Medicine

## 2015-07-03 NOTE — Telephone Encounter (Signed)
If she is having continued increased swelling and increasing pain and is worsening, I recommend an evaluation.  I recommend going ahead this pm and having evaluated.  ([facility] or [place] urgent care).

## 2015-07-03 NOTE — Telephone Encounter (Signed)
Told pt that if it got worse to go to [facility] acute clinic or [place] urgent care. She was responsive and was going to give it another day.

## 2015-07-03 NOTE — Telephone Encounter (Signed)
Pt came in and got the pneumonia shot on 06/30/15 and it swell up and red that day on left arm. The next day the swellen under the arm. Now the whole left arm ache down to her hand. Thank you!

## 2015-07-06 ENCOUNTER — Other Ambulatory Visit: Payer: Medicare Other

## 2015-07-06 ENCOUNTER — Ambulatory Visit (INDEPENDENT_AMBULATORY_CARE_PROVIDER_SITE_OTHER): Payer: Medicare Other | Admitting: General Surgery

## 2015-07-06 VITALS — BP 130/74 | HR 70 | Resp 14 | Ht 65.0 in | Wt 182.0 lb

## 2015-07-06 DIAGNOSIS — N63 Unspecified lump in breast: Secondary | ICD-10-CM

## 2015-07-06 DIAGNOSIS — Z853 Personal history of malignant neoplasm of breast: Secondary | ICD-10-CM

## 2015-07-06 DIAGNOSIS — R928 Other abnormal and inconclusive findings on diagnostic imaging of breast: Secondary | ICD-10-CM | POA: Diagnosis not present

## 2015-07-06 DIAGNOSIS — N641 Fat necrosis of breast: Secondary | ICD-10-CM | POA: Diagnosis not present

## 2015-07-06 DIAGNOSIS — N6320 Unspecified lump in the left breast, unspecified quadrant: Secondary | ICD-10-CM | POA: Insufficient documentation

## 2015-07-06 NOTE — Progress Notes (Signed)
Patient ID: Kerri Mills, female   DOB: 05/10/1931, 81 y.o.   MRN: [id number]  Chief Complaint  Patient presents with  . Follow-up    left breast ultrasound    HPI Kerri Mills is a 81 y.o. female here today for reassessment of the left breast. Ultrasound completed earlier in the year suggested 2 areas of abnormality in the 11 and 7:00 position. Biopsy of the 7:00 position was benign. The patient herself is unaware of any changes in the breast, although she is aware that is significantly "lumpy".HPI  Past Medical History  Diagnosis Date  . Hypertension   . Gastric ulcer   . Heart disease   . GERD (gastroesophageal reflux disease)   . Hypercholesterolemia   . Seroma     HISTORY OF LFT BREAST  . Anxiety   . Melanoma in situ of upper extremity ([place]) 03/19/2011  . Diverticulitis 2013  . Sleep apnea   . Arthritis   . Depression   . Allergy   . Colitis   . Breast CA ([place])   . Infiltrating lobular carcinoma of left breast 2011    T2,N0, ER: 90%; PR 0%; Her 2 neu not amplified. [facility]).  . Thyroid cancer ([place]) 03/19/2011    Past Surgical History  Procedure Laterality Date  . Tonsillectomy    . Cholecystectomy    . Melanoma excision      RT UPPER ARM  . Thyroid surgery      FOR THYROID CANCER  . Partial hysterectomy      bleeding, ovaries in place.    . Colonoscopy  2013  . Abdominal hysterectomy  1973    partial  . Breast lumpectomy  2011    left breast  . Breast biopsy Left 02-13-13    BENIGN BREAST TISSUE WITH CHANGES CONSISTENT WITH FAT NECROSIS    Family History  Problem Relation Age of Onset  . Heart disease Mother   . Cancer Brother     lung     Social History Social History  Substance Use Topics  . Smoking status: Never Smoker   . Smokeless tobacco: Never Used  . Alcohol Use: 0.0 oz/week    0 Standard drinks or equivalent per week     Comment: social drinking. average times a week    Allergies  Allergen Reactions  . Atorvastatin    Other reaction(s): Other (See Comments) STIFFNESS AND SORE STIFFNESS AND SORE  . Lipitor [Atorvastatin Calcium] Other (See Comments)    Stiffness & soreness  . Penicillins Rash    REACTION: Unknown reaction    Current Outpatient Prescriptions  Medication Sig Dispense Refill  . anastrozole (ARIMIDEX) 1 MG tablet TAKE ONE TABLET BY MOUTH ONCE DAILY 90 tablet 0  . Cholecalciferol (VITAMIN D) 1000 UNITS capsule Take 1,000 Units by mouth daily.      . DULoxetine (CYMBALTA) 60 MG capsule TAKE 1 CAPSULE AT BEDTIME. 90 capsule 0  . gabapentin (NEURONTIN) 300 MG capsule TAKE 2 CAPSULES AT BEDTIME 180 capsule 1  . levothyroxine (SYNTHROID, LEVOTHROID) 88 MCG tablet TAKE ONE TABLET BY MOUTH ONCE DAILY BEFORE BREAKFAST 90 tablet 3  . losartan-hydrochlorothiazide (HYZAAR) 100-12.5 MG tablet TAKE 1 TABLET DAILY 90 tablet 0  . lovastatin (MEVACOR) 40 MG tablet TAKE 1 TABLET DAILY 90 tablet 0  . Multiple Vitamins-Minerals (PRESERVISION AREDS 2 PO) Take 2 tablets by mouth daily.      Marland Kitchen omeprazole (PRILOSEC) 20 MG capsule Take 1 capsule (20 mg total) by mouth  daily. 90 capsule 1  . propranolol (INDERAL) 10 MG tablet TAKE 1 TABLET DAILY AS NEEDED 90 tablet 0  . warfarin (COUMADIN) 3 MG tablet TAKE 1 TABLET DAILY 90 tablet 0   No current facility-administered medications for this visit.    Review of Systems Review of Systems  Constitutional: Negative.   Respiratory: Negative.   Cardiovascular: Negative.     Blood pressure 130/74, pulse 70, resp. rate 14, height 5\' 5"  (1.651 m), weight 182 lb (82.555 kg).  Physical Exam Physical Exam  Constitutional: She is oriented to person, place, and time. She appears well-developed and well-nourished.  Eyes: Conjunctivae are normal. No scleral icterus.  Neck: Neck supple.  Cardiovascular: Normal rate, regular rhythm and normal heart sounds.   Pulmonary/Chest: Effort normal and breath sounds normal. Right breast exhibits no inverted nipple, no mass, no nipple  discharge, no skin change and no tenderness. Left breast exhibits no inverted nipple, no nipple discharge, no skin change and no tenderness.    Left breast incision thickening 12 o'clock to 3 o'clock   Lymphadenopathy:    She has no cervical adenopathy.    She has no axillary adenopathy.  Neurological: She is alert and oriented to person, place, and time.  Skin: Skin is warm and dry.    Data Reviewed Ultrasound examination of the left breast in the 7:00 position, 4 cm from the nipple shows a residual area measuring 0.2 x 0.24 x 0.3 cm. This is smaller than noted at the time of her prior biopsy. Previous pathology was benign.  The area of the left breast from the 10 to 2:00 position is difficult to assess due to the significant scarring from her previous surgery. There appears to be increased prominence in the 11:00 location, 6 cm the nipple compared to her April exam. Here a 0.7 x 0.9 x 1.2 cm hypoechoic multilobulated area is noted. No increased vascular flow on duplex imaging. BI-RADS-4.    Assessment    Significant change in ultrasound imaging of the 11:00 position of the left breast, previous breast cancer.    Plan    The patient was amenable to core biopsy of this area. Due to her ongoing anticoagulation was elected to complete this using a 14-gauge spring-loaded device.  Area was prepped with alcohol followed by 10 mL of 0.5% Xylocaine with 0.25% Marcaine with 1-200,000 units of epinephrine. Chlorpropamide applied to the skin. Pre-and post-fire images were obtained. Biopsies were obtained from the superficial, midportion and deep portion of the lesion area 6 core samples were obtained. The clinical impression of the removed tissue was fibrotic fat.  Scant bleeding was noted. The skin defect was closed with benzoin and Steri-Strips followed by Telfa and Tegaderm dressing. Postbiopsy instructions were provided.  The patient will be contacted when the biopsy results have returned.  Assuming these are benign arrangements were made for bilateral mammograms in 6 months.     PCP:  Nash Shearer 07/06/2015, 4:25 PM

## 2015-07-06 NOTE — Patient Instructions (Signed)

## 2015-07-07 ENCOUNTER — Telehealth: Payer: Self-pay | Admitting: *Deleted

## 2015-07-07 NOTE — Telephone Encounter (Signed)
-----   Message from Robert Bellow, MD sent at 07/07/2015  9:44 AM EDT ----- Please notify the patient that the biopsy showed fat necrosis, changes from surgery and radiation as suspected after the procedure. Follow-up with nursing in one week and M.D. in 6 months.  ----- Message -----    From: Lab in [place] Interface    Sent: 07/07/2015   9:07 AM      To: Robert Bellow, MD

## 2015-07-08 ENCOUNTER — Encounter: Payer: Self-pay | Admitting: Internal Medicine

## 2015-07-08 ENCOUNTER — Ambulatory Visit (INDEPENDENT_AMBULATORY_CARE_PROVIDER_SITE_OTHER): Payer: Medicare Other | Admitting: Internal Medicine

## 2015-07-08 VITALS — BP 110/70 | HR 75 | Temp 97.7°F | Resp 18 | Ht 65.0 in | Wt 183.2 lb

## 2015-07-08 DIAGNOSIS — Z7901 Long term (current) use of anticoagulants: Secondary | ICD-10-CM

## 2015-07-08 DIAGNOSIS — I4891 Unspecified atrial fibrillation: Secondary | ICD-10-CM

## 2015-07-08 DIAGNOSIS — Z658 Other specified problems related to psychosocial circumstances: Secondary | ICD-10-CM

## 2015-07-08 DIAGNOSIS — G4733 Obstructive sleep apnea (adult) (pediatric): Secondary | ICD-10-CM | POA: Diagnosis not present

## 2015-07-08 DIAGNOSIS — K219 Gastro-esophageal reflux disease without esophagitis: Secondary | ICD-10-CM | POA: Diagnosis not present

## 2015-07-08 DIAGNOSIS — C73 Malignant neoplasm of thyroid gland: Secondary | ICD-10-CM

## 2015-07-08 DIAGNOSIS — F439 Reaction to severe stress, unspecified: Secondary | ICD-10-CM

## 2015-07-08 DIAGNOSIS — D0360 Melanoma in situ of unspecified upper limb, including shoulder: Secondary | ICD-10-CM

## 2015-07-08 DIAGNOSIS — Z5181 Encounter for therapeutic drug level monitoring: Secondary | ICD-10-CM | POA: Diagnosis not present

## 2015-07-08 DIAGNOSIS — C50919 Malignant neoplasm of unspecified site of unspecified female breast: Secondary | ICD-10-CM

## 2015-07-08 LAB — PROTIME-INR
INR: 2.8 ratio — AB (ref 0.8–1.0)
Prothrombin Time: 29.7 s — ABNORMAL HIGH (ref 9.6–13.1)

## 2015-07-08 MED ORDER — OMEPRAZOLE 20 MG PO CPDR: 20.0000 mg | DELAYED_RELEASE_CAPSULE | Freq: Every day | ORAL | Status: DC

## 2015-07-08 NOTE — Progress Notes (Signed)
Patient ID: Kerri Mills, female   DOB: [birth year], 81 y.o.   MRN: [id number]   Subjective:    Patient ID: Kerri Mills, female    DOB: 1931/05/26, 81 y.o.   MRN: [id number]  HPI  Patient with past history of afib, OSA, GERD and breast cancer who comes in today to follow up on these issues.  She has been dong relatively well.  Had a pneumonia shot recently.  Has a redness and swelling in her left arm.  This has resolved.  Seeing dermatology.  Had f/u 06/30/15.  Facial lesions removed.  Stays active.  No cardiac symptoms with increased activity or exertion.  No sob.  No acid reflux.  Had recent breast biopsy 07/06/15 - core biopsy - fat necrosis.  Planning to f/u in 6 months.  No abdominal pain or cramping.  Bowels stable.     Past Medical History  Diagnosis Date  . Hypertension   . Gastric ulcer   . Heart disease   . GERD (gastroesophageal reflux disease)   . Hypercholesterolemia   . Seroma     HISTORY OF LFT BREAST  . Anxiety   . Melanoma in situ of upper extremity ([place]) 03/19/2011  . Diverticulitis 2013  . Sleep apnea   . Arthritis   . Depression   . Allergy   . Colitis   . Breast CA ([place])   . Infiltrating lobular carcinoma of left breast 2011    T2,N0, ER: 90%; PR 0%; Her 2 neu not amplified. [facility]).  . Thyroid cancer ([place]) 03/19/2011   Past Surgical History  Procedure Laterality Date  . Tonsillectomy    . Cholecystectomy    . Melanoma excision      RT UPPER ARM  . Thyroid surgery      FOR THYROID CANCER  . Partial hysterectomy      bleeding, ovaries in place.    . Colonoscopy  2013  . Abdominal hysterectomy  1973    partial  . Breast lumpectomy  2011    left breast  . Breast biopsy Left 02-13-13    BENIGN BREAST TISSUE WITH CHANGES CONSISTENT WITH FAT NECROSIS   Family History  Problem Relation Age of Onset  . Heart disease Mother   . Cancer Brother     lung    Social History   Social History  . Marital Status: Widowed    Spouse Name: N/A  .  Number of Children: N/A  . Years of Education: N/A   Social History Main Topics  . Smoking status: Never Smoker   . Smokeless tobacco: Never Used  . Alcohol Use: 0.0 oz/week    0 Standard drinks or equivalent per week     Comment: social drinking. average times a week  . Drug Use: No  . Sexual Activity: No   Other Topics Concern  . None   Social History Narrative    Outpatient Encounter Prescriptions as of 07/08/2015  Medication Sig  . Cholecalciferol (VITAMIN D) 1000 UNITS capsule Take 1,000 Units by mouth daily.    . DULoxetine (CYMBALTA) 60 MG capsule TAKE 1 CAPSULE AT BEDTIME.  Marland Kitchen gabapentin (NEURONTIN) 300 MG capsule TAKE 2 CAPSULES AT BEDTIME  . levothyroxine (SYNTHROID, LEVOTHROID) 88 MCG tablet TAKE ONE TABLET BY MOUTH ONCE DAILY BEFORE BREAKFAST  . losartan-hydrochlorothiazide (HYZAAR) 100-12.5 MG tablet TAKE 1 TABLET DAILY  . lovastatin (MEVACOR) 40 MG tablet TAKE 1 TABLET DAILY  . Multiple Vitamins-Minerals (PRESERVISION AREDS 2 PO) Take 2  tablets by mouth daily.    Marland Kitchen omeprazole (PRILOSEC) 20 MG capsule Take 1 capsule (20 mg total) by mouth daily.  . propranolol (INDERAL) 10 MG tablet TAKE 1 TABLET DAILY AS NEEDED  . warfarin (COUMADIN) 3 MG tablet TAKE 1 TABLET DAILY  . [DISCONTINUED] anastrozole (ARIMIDEX) 1 MG tablet TAKE ONE TABLET BY MOUTH ONCE DAILY  . [DISCONTINUED] omeprazole (PRILOSEC) 20 MG capsule Take 1 capsule (20 mg total) by mouth daily.   No facility-administered encounter medications on file as of 07/08/2015.    Review of Systems  Constitutional: Negative for appetite change and unexpected weight change.  HENT: Negative for congestion and sinus pressure.   Eyes: Negative for discharge and visual disturbance.  Respiratory: Negative for cough, chest tightness and shortness of breath.   Cardiovascular: Negative for chest pain, palpitations and leg swelling.  Gastrointestinal: Negative for nausea, vomiting, abdominal pain and diarrhea.  Genitourinary:  Negative for dysuria and difficulty urinating.  Musculoskeletal: Negative for back pain and joint swelling.  Skin: Negative for color change and rash.  Neurological: Negative for dizziness, light-headedness and headaches.  Psychiatric/Behavioral: Negative for dysphoric mood and agitation.       Objective:    Physical Exam  Constitutional: She appears well-developed and well-nourished. No distress.  HENT:  Nose: Nose normal.  Mouth/Throat: Oropharynx is clear and moist.  Eyes: Conjunctivae are normal. Right eye exhibits no discharge. Left eye exhibits no discharge.  Neck: Neck supple. No thyromegaly present.  Cardiovascular: Normal rate and regular rhythm.   Pulmonary/Chest: Breath sounds normal. No respiratory distress. She has no wheezes.  Abdominal: Soft. Bowel sounds are normal. There is no tenderness.  Musculoskeletal: She exhibits no edema or tenderness.  No pain to palpation left upper extremity.    Lymphadenopathy:    She has no cervical adenopathy.  Skin: No rash noted. No erythema.  Psychiatric: She has a normal mood and affect. Her behavior is normal.    BP 110/70 mmHg  Pulse 75  Temp(Src) 97.7 F (36.5 C) (Oral)  Resp 18  Ht 5\' 5"  (1.651 m)  Wt 183 lb 4 oz (83.122 kg)  BMI 30.49 kg/m2  SpO2 96% Wt Readings from Last 3 Encounters:  07/08/15 183 lb 4 oz (83.122 kg)  07/06/15 182 lb (82.555 kg)  07/01/15 183 lb 12.8 oz (83.371 kg)     Lab Results  Component Value Date   WBC 4.5 05/05/2015   HGB 12.1 05/05/2015   HCT 36.7 05/05/2015   PLT 214.0 05/05/2015   GLUCOSE 141* 05/05/2015   CHOL 175 05/20/2014   TRIG 272.0* 05/20/2014   HDL 33.80* 05/20/2014   LDLDIRECT 103.4 05/20/2014   LDLCALC 107* 01/03/2014   ALT 12 05/05/2015   AST 16 05/05/2015   NA 139 05/05/2015   K 3.8 05/05/2015   CL 104 05/05/2015   CREATININE 0.84 05/05/2015   BUN 9 05/05/2015   CO2 31 05/05/2015   TSH 4.41 05/05/2015   INR 2.8* 07/08/2015   HGBA1C 6.0 05/05/2015         Assessment & Plan:   Problem List Items Addressed This Visit    Atrial fibrillation ([place])    Rate controlled.  On coumadin  Follow pt/inr.  Check today.       GERD (gastroesophageal reflux disease)    On omeprazole.  Reflux controlled.        Relevant Medications   omeprazole (PRILOSEC) 20 MG capsule   Infiltrating lobular carcinoma of left breast    Followed by  Dr Bary Castilla.  Recently evaluated.  Had core biopsy - fat necrosis.  Recommended f/u in 6 months.  See Dr Dwyane Luo note.        Melanoma in situ of upper extremity ([place])    Followed by dermatology.        Obstructive sleep apnea    Using CPAP.       Stress    Has seen psychiatry.  On cymbalta.  Stable.        Thyroid cancer [facility])    S/p radiation therapy post op.  On thyroid replacement.  Follow tsh.        Other Visit Diagnoses    Anticoagulated on Coumadin    -  Primary    Relevant Orders    Protime-INR (Completed)        Einar Pheasant, MD

## 2015-07-08 NOTE — Progress Notes (Signed)
Pre-visit discussion using our clinic review tool. No additional management support is needed unless otherwise documented below in the visit note.  

## 2015-07-12 ENCOUNTER — Encounter: Payer: Self-pay | Admitting: Internal Medicine

## 2015-07-12 NOTE — Assessment & Plan Note (Signed)
Followed by dermatology

## 2015-07-12 NOTE — Assessment & Plan Note (Signed)
Has seen psychiatry.  On cymbalta.  Stable.

## 2015-07-12 NOTE — Assessment & Plan Note (Signed)
On omeprazole.  Reflux controlled.

## 2015-07-12 NOTE — Assessment & Plan Note (Signed)
S/p radiation therapy post op.  On thyroid replacement.  Follow tsh.

## 2015-07-12 NOTE — Assessment & Plan Note (Signed)
Using CPAP 

## 2015-07-12 NOTE — Assessment & Plan Note (Signed)
Followed by Dr Bary Castilla.  Recently evaluated.  Had core biopsy - fat necrosis.  Recommended f/u in 6 months.  See Dr Dwyane Luo note.

## 2015-07-12 NOTE — Assessment & Plan Note (Signed)
Rate controlled.  On coumadin  Follow pt/inr.  Check today.

## 2015-07-13 ENCOUNTER — Ambulatory Visit: Payer: Medicare Other

## 2015-07-13 DIAGNOSIS — H353114 Nonexudative age-related macular degeneration, right eye, advanced atrophic with subfoveal involvement: Secondary | ICD-10-CM | POA: Diagnosis not present

## 2015-07-13 DIAGNOSIS — N6320 Unspecified lump in the left breast, unspecified quadrant: Secondary | ICD-10-CM

## 2015-07-13 NOTE — Patient Instructions (Signed)
Follow up as needed

## 2015-07-13 NOTE — Progress Notes (Signed)
Patient ID: Kerri Mills, female   DOB: [birth year], 81 y.o.   MRN: [id number] Patient came in today for a wound check of the left breast. She had a left breast biopsy done on 07/06/15. The wound is clean, with no signs of infection noted. Patient is aware of her pathology. Follow up as scheduled in April 2017 with MD.

## 2015-08-05 ENCOUNTER — Other Ambulatory Visit (INDEPENDENT_AMBULATORY_CARE_PROVIDER_SITE_OTHER): Payer: Medicare Other

## 2015-08-05 DIAGNOSIS — Z7901 Long term (current) use of anticoagulants: Secondary | ICD-10-CM

## 2015-08-05 LAB — PROTIME-INR
INR: 2.2 ratio — AB (ref 0.8–1.0)
Prothrombin Time: 24 s — ABNORMAL HIGH (ref 9.6–13.1)

## 2015-08-06 ENCOUNTER — Other Ambulatory Visit: Payer: Medicare Other

## 2015-08-17 ENCOUNTER — Telehealth: Payer: Self-pay | Admitting: General Surgery

## 2015-08-17 NOTE — Telephone Encounter (Signed)
PT CALLED TODAY & WOULD LIKE A SCRIPT FOR A NEW PROTHESIS [id number]) TO [facility] IN [place]. SHE HAS AN APPT TUESDAY 08-18-15 @ 10:00AM.

## 2015-08-18 DIAGNOSIS — C50012 Malignant neoplasm of nipple and areola, left female breast: Secondary | ICD-10-CM | POA: Diagnosis not present

## 2015-08-24 NOTE — Telephone Encounter (Signed)
08-24-15@ 2:00PMI CALLED [facility] IN [place] @ [phone] & SPOKE WITH TERRI.I WAS INQUIRING IF THE HAD RECEIVED SCRIPT FROM DR BYRNETT ON PT.SHE STATED DORIS FARMER HANDLES THIS & WILL RET IN ABOUT 10-15 MINS SHE WILL RET MY CALL .

## 2015-08-25 ENCOUNTER — Other Ambulatory Visit (INDEPENDENT_AMBULATORY_CARE_PROVIDER_SITE_OTHER): Payer: Medicare Other

## 2015-08-25 DIAGNOSIS — Z7901 Long term (current) use of anticoagulants: Secondary | ICD-10-CM

## 2015-08-25 LAB — PROTIME-INR
INR: 2.2 ratio — AB (ref 0.8–1.0)
PROTHROMBIN TIME: 23.8 s — AB (ref 9.6–13.1)

## 2015-08-27 NOTE — Telephone Encounter (Signed)
08-27-15 CALLED BACK TO [facility] & SPOKE WITH A LADY THERE THAT CONFIRMED SCRIPT RECEIVED/MTH

## 2015-09-06 ENCOUNTER — Other Ambulatory Visit: Payer: Self-pay | Admitting: Internal Medicine

## 2015-09-11 ENCOUNTER — Other Ambulatory Visit: Payer: Self-pay

## 2015-09-11 ENCOUNTER — Other Ambulatory Visit: Payer: Self-pay | Admitting: Internal Medicine

## 2015-09-18 ENCOUNTER — Other Ambulatory Visit: Payer: Self-pay | Admitting: Internal Medicine

## 2015-09-20 ENCOUNTER — Other Ambulatory Visit: Payer: Self-pay | Admitting: Internal Medicine

## 2015-09-22 NOTE — Telephone Encounter (Signed)
please advise? Patient seen in October 2016

## 2015-09-22 NOTE — Telephone Encounter (Signed)
ok'd refill coumadin #90 with one refill.

## 2015-09-23 ENCOUNTER — Other Ambulatory Visit (INDEPENDENT_AMBULATORY_CARE_PROVIDER_SITE_OTHER): Payer: Medicare Other

## 2015-09-23 DIAGNOSIS — Z7901 Long term (current) use of anticoagulants: Secondary | ICD-10-CM

## 2015-09-23 LAB — PROTIME-INR
INR: 2.4 ratio — ABNORMAL HIGH (ref 0.8–1.0)
PROTHROMBIN TIME: 26.2 s — AB (ref 9.6–13.1)

## 2015-09-24 ENCOUNTER — Telehealth: Payer: Self-pay | Admitting: Internal Medicine

## 2015-09-24 NOTE — Telephone Encounter (Signed)
I accidentally closed her lab, so I am sending this message as a phone message instead of a result note.  Notify her that her INR is ok.  Continue same coumadin dose and recheck pt/inr in 4 weeks.  Schedule a non fasting lab appt in 4 weeks.  Thanks.

## 2015-09-24 NOTE — Telephone Encounter (Signed)
Spoke with the patient, reviewed PT/INR results.  Scheduled for INR draw on 1/26 at 9am.

## 2015-10-22 ENCOUNTER — Other Ambulatory Visit (INDEPENDENT_AMBULATORY_CARE_PROVIDER_SITE_OTHER): Payer: Medicare Other

## 2015-10-22 DIAGNOSIS — Z7901 Long term (current) use of anticoagulants: Secondary | ICD-10-CM | POA: Diagnosis not present

## 2015-10-22 LAB — PROTIME-INR
INR: 1.8 ratio — AB (ref 0.8–1.0)
Prothrombin Time: 18.8 s — ABNORMAL HIGH (ref 9.6–13.1)

## 2015-10-27 ENCOUNTER — Other Ambulatory Visit: Payer: Self-pay | Admitting: *Deleted

## 2015-10-27 DIAGNOSIS — Z853 Personal history of malignant neoplasm of breast: Secondary | ICD-10-CM

## 2015-11-02 ENCOUNTER — Other Ambulatory Visit: Payer: Medicare Other

## 2015-11-03 ENCOUNTER — Encounter: Payer: Self-pay | Admitting: *Deleted

## 2015-11-03 ENCOUNTER — Other Ambulatory Visit (INDEPENDENT_AMBULATORY_CARE_PROVIDER_SITE_OTHER): Payer: Medicare Other

## 2015-11-03 DIAGNOSIS — Z7901 Long term (current) use of anticoagulants: Secondary | ICD-10-CM

## 2015-11-03 LAB — PROTIME-INR
INR: 2.1 ratio — ABNORMAL HIGH (ref 0.8–1.0)
Prothrombin Time: 22.5 s — ABNORMAL HIGH (ref 9.6–13.1)

## 2015-11-11 ENCOUNTER — Telehealth: Payer: Self-pay | Admitting: Internal Medicine

## 2015-11-11 NOTE — Telephone Encounter (Signed)
Pt received call to schedule AWV. Call pt @ [phone]. Thank you!

## 2015-12-02 ENCOUNTER — Other Ambulatory Visit (INDEPENDENT_AMBULATORY_CARE_PROVIDER_SITE_OTHER): Payer: Medicare Other

## 2015-12-02 DIAGNOSIS — Z7901 Long term (current) use of anticoagulants: Secondary | ICD-10-CM | POA: Diagnosis not present

## 2015-12-02 LAB — PROTIME-INR
INR: 2.1 ratio — ABNORMAL HIGH (ref 0.8–1.0)
PROTHROMBIN TIME: 23 s — AB (ref 9.6–13.1)

## 2015-12-03 ENCOUNTER — Other Ambulatory Visit: Payer: Self-pay | Admitting: Internal Medicine

## 2015-12-03 DIAGNOSIS — Z7901 Long term (current) use of anticoagulants: Secondary | ICD-10-CM

## 2015-12-03 DIAGNOSIS — E7800 Pure hypercholesterolemia, unspecified: Secondary | ICD-10-CM

## 2015-12-03 DIAGNOSIS — I10 Essential (primary) hypertension: Secondary | ICD-10-CM

## 2015-12-03 NOTE — Progress Notes (Signed)
Order placed for f/u labs.  

## 2015-12-14 ENCOUNTER — Telehealth: Payer: Self-pay | Admitting: Internal Medicine

## 2015-12-14 MED ORDER — GABAPENTIN 300 MG PO CAPS: 600.0000 mg | ORAL_CAPSULE | Freq: Every day | ORAL | Status: DC

## 2015-12-14 MED ORDER — PROPRANOLOL HCL 10 MG PO TABS: 10.0000 mg | ORAL_TABLET | Freq: Every day | ORAL | Status: DC | PRN

## 2015-12-14 MED ORDER — LOVASTATIN 40 MG PO TABS: 40.0000 mg | ORAL_TABLET | Freq: Every day | ORAL | Status: DC

## 2015-12-14 MED ORDER — DULOXETINE HCL 60 MG PO CPEP: ORAL_CAPSULE | ORAL | Status: DC

## 2015-12-14 MED ORDER — LEVOTHYROXINE SODIUM 88 MCG PO TABS: ORAL_TABLET | ORAL | Status: DC

## 2015-12-14 MED ORDER — WARFARIN SODIUM 3 MG PO TABS
3.0000 mg | ORAL_TABLET | Freq: Every day | ORAL | Status: DC
Start: 2015-12-14 — End: 2016-03-15

## 2015-12-14 MED ORDER — LOSARTAN POTASSIUM-HCTZ 100-12.5 MG PO TABS: 1.0000 | ORAL_TABLET | Freq: Every day | ORAL | Status: DC

## 2015-12-14 MED ORDER — OMEPRAZOLE 20 MG PO CPDR: 20.0000 mg | DELAYED_RELEASE_CAPSULE | Freq: Every day | ORAL | Status: DC

## 2015-12-14 NOTE — Telephone Encounter (Signed)
Meds sent to new pharmacy 

## 2015-12-14 NOTE — Telephone Encounter (Signed)
Pt called about needing a refill for prescriptions all for 90 days, gabapentin (NEURONTIN) 300 MG capsule, DULoxetine (CYMBALTA) 60 MG capsule, propranolol (INDERAL) 10 MG tablet, levothyroxine (SYNTHROID, LEVOTHROID) 88 MCG tablet, omeprazole (PRILOSEC) 20 MG capsule, warfarin (COUMADIN) 3 MG tablet , lovastatin (MEVACOR) 40 MG tablet and losartan-hydrochlorothiazide (HYZAAR) 100-12.5 MG tablet. Pharmacy is [place]. [facility]. Thank you!

## 2015-12-17 DIAGNOSIS — H353114 Nonexudative age-related macular degeneration, right eye, advanced atrophic with subfoveal involvement: Secondary | ICD-10-CM | POA: Diagnosis not present

## 2015-12-17 DIAGNOSIS — H353123 Nonexudative age-related macular degeneration, left eye, advanced atrophic without subfoveal involvement: Secondary | ICD-10-CM | POA: Diagnosis not present

## 2015-12-17 DIAGNOSIS — H35363 Drusen (degenerative) of macula, bilateral: Secondary | ICD-10-CM | POA: Diagnosis not present

## 2015-12-17 DIAGNOSIS — H43813 Vitreous degeneration, bilateral: Secondary | ICD-10-CM | POA: Diagnosis not present

## 2015-12-31 ENCOUNTER — Other Ambulatory Visit (INDEPENDENT_AMBULATORY_CARE_PROVIDER_SITE_OTHER): Payer: Medicare Other

## 2015-12-31 DIAGNOSIS — E7800 Pure hypercholesterolemia, unspecified: Secondary | ICD-10-CM | POA: Diagnosis not present

## 2015-12-31 DIAGNOSIS — Z7901 Long term (current) use of anticoagulants: Secondary | ICD-10-CM | POA: Diagnosis not present

## 2015-12-31 DIAGNOSIS — I10 Essential (primary) hypertension: Secondary | ICD-10-CM

## 2015-12-31 DIAGNOSIS — Z5181 Encounter for therapeutic drug level monitoring: Secondary | ICD-10-CM | POA: Diagnosis not present

## 2015-12-31 LAB — PROTIME-INR
INR: 2.3 ratio — AB (ref 0.8–1.0)
Prothrombin Time: 24.4 s — ABNORMAL HIGH (ref 9.6–13.1)

## 2015-12-31 LAB — HEPATIC FUNCTION PANEL
ALT: 13 U/L (ref 0–35)
AST: 16 U/L (ref 0–37)
Albumin: 4.1 g/dL (ref 3.5–5.2)
Alkaline Phosphatase: 103 U/L (ref 39–117)
BILIRUBIN TOTAL: 0.5 mg/dL (ref 0.2–1.2)
Bilirubin, Direct: 0.1 mg/dL (ref 0.0–0.3)
TOTAL PROTEIN: 7.1 g/dL (ref 6.0–8.3)

## 2015-12-31 LAB — BASIC METABOLIC PANEL
BUN: 14 mg/dL (ref 6–23)
CHLORIDE: 103 meq/L (ref 96–112)
CO2: 32 mEq/L (ref 19–32)
Calcium: 10 mg/dL (ref 8.4–10.5)
Creatinine, Ser: 0.87 mg/dL (ref 0.40–1.20)
GFR: 66.17 mL/min (ref >60.00)
GLUCOSE: 124 mg/dL — AB (ref 70–99)
POTASSIUM: 3.7 meq/L (ref 3.5–5.1)
SODIUM: 141 meq/L (ref 135–145)

## 2015-12-31 LAB — LDL CHOLESTEROL, DIRECT: LDL DIRECT: 92 mg/dL

## 2015-12-31 LAB — LIPID PANEL
Cholesterol: 185 mg/dL (ref 0–200)
HDL: 36.7 mg/dL — ABNORMAL LOW (ref >39.00)
NonHDL: 147.85
Total CHOL/HDL Ratio: 5
Triglycerides: 259 mg/dL — ABNORMAL HIGH (ref 0.0–149.0)
VLDL: 51.8 mg/dL — AB (ref 0.0–40.0)

## 2015-12-31 LAB — TSH: TSH: 2.73 u[IU]/mL (ref 0.35–4.50)

## 2016-01-01 ENCOUNTER — Other Ambulatory Visit: Payer: Self-pay | Admitting: Internal Medicine

## 2016-01-01 ENCOUNTER — Encounter: Payer: Self-pay | Admitting: *Deleted

## 2016-01-01 DIAGNOSIS — R739 Hyperglycemia, unspecified: Secondary | ICD-10-CM

## 2016-01-01 DIAGNOSIS — H353114 Nonexudative age-related macular degeneration, right eye, advanced atrophic with subfoveal involvement: Secondary | ICD-10-CM | POA: Diagnosis not present

## 2016-01-01 DIAGNOSIS — I4891 Unspecified atrial fibrillation: Secondary | ICD-10-CM

## 2016-01-01 NOTE — Progress Notes (Signed)
Order placed for labs.

## 2016-01-13 ENCOUNTER — Ambulatory Visit
Admission: RE | Admit: 2016-01-13 | Discharge: 2016-01-13 | Disposition: A | Discharge status: Home / Self Care | Payer: Medicare Other | Source: Ambulatory Visit | Attending: General Surgery | Admitting: General Surgery

## 2016-01-13 ENCOUNTER — Other Ambulatory Visit: Payer: Self-pay | Admitting: General Surgery

## 2016-01-13 DIAGNOSIS — N6489 Other specified disorders of breast: Secondary | ICD-10-CM | POA: Diagnosis not present

## 2016-01-13 DIAGNOSIS — R928 Other abnormal and inconclusive findings on diagnostic imaging of breast: Secondary | ICD-10-CM | POA: Diagnosis not present

## 2016-01-13 DIAGNOSIS — Z853 Personal history of malignant neoplasm of breast: Secondary | ICD-10-CM

## 2016-01-13 DIAGNOSIS — R921 Mammographic calcification found on diagnostic imaging of breast: Secondary | ICD-10-CM | POA: Diagnosis not present

## 2016-01-13 HISTORY — DX: Malignant melanoma of skin, unspecified: C43.9

## 2016-01-14 ENCOUNTER — Telehealth: Payer: Self-pay | Admitting: *Deleted

## 2016-01-14 NOTE — Telephone Encounter (Signed)
Mammogram report reviewed.  We'll discuss with the patient at follow-up whether MRI would be beneficial in her condition.

## 2016-01-14 NOTE — Telephone Encounter (Signed)
Patient had mammogram yesterday and they told her she needs a MRI. She had diagnostic mammogram, came back a Cat 0 with additional views needed. At [facility] they told her she needs to have a MRI. Patient has appt with you on 01/18/16, do you still want her to come Monday?

## 2016-01-14 NOTE — Telephone Encounter (Signed)
Spoke with patient and notified her to go ahead and come in for her regular appointment and Dr Bary Castilla would discuss any recommendations with her then. Patient expresses understanding and will keep her appointment.

## 2016-01-15 ENCOUNTER — Other Ambulatory Visit: Payer: Self-pay | Admitting: General Surgery

## 2016-01-15 DIAGNOSIS — R928 Other abnormal and inconclusive findings on diagnostic imaging of breast: Secondary | ICD-10-CM

## 2016-01-18 ENCOUNTER — Ambulatory Visit (INDEPENDENT_AMBULATORY_CARE_PROVIDER_SITE_OTHER): Payer: Medicare Other | Admitting: General Surgery

## 2016-01-18 ENCOUNTER — Encounter: Payer: Self-pay | Admitting: General Surgery

## 2016-01-18 VITALS — BP 108/58 | HR 70 | Resp 12 | Ht 66.0 in | Wt 185.0 lb

## 2016-01-18 DIAGNOSIS — Z853 Personal history of malignant neoplasm of breast: Secondary | ICD-10-CM | POA: Diagnosis not present

## 2016-01-18 DIAGNOSIS — R928 Other abnormal and inconclusive findings on diagnostic imaging of breast: Secondary | ICD-10-CM

## 2016-01-18 NOTE — Progress Notes (Signed)
Patient ID: Kerri Mills, female   DOB: 1931-08-16, 82 y.o.   MRN: [id number]  Chief Complaint  Patient presents with  . Follow-up    mammogram    HPI Kerri Mills is a 82 y.o. female who presents for a breast evaluation. The most recent mammogram was done on 01/13/16.  Patient does perform regular self breast checks and gets regular mammograms done.    I personally reviewed the patient's history.  HPI  Past Medical History  Diagnosis Date  . Hypertension   . Gastric ulcer   . Heart disease   . GERD (gastroesophageal reflux disease)   . Hypercholesterolemia   . Seroma     HISTORY OF LFT BREAST  . Anxiety   . Melanoma in situ of upper extremity ([place]) 03/19/2011  . Diverticulitis 2013  . Sleep apnea   . Arthritis   . Depression   . Allergy   . Colitis   . Breast CA ([place])   . Infiltrating lobular carcinoma of left breast 2011    T2,N0, ER: 90%; PR 0%; Her 2 neu not amplified. [facility]).  . Thyroid cancer ([place]) 03/19/2011  . Melanoma ([place]) 1997    Past Surgical History  Procedure Laterality Date  . Tonsillectomy    . Cholecystectomy    . Melanoma excision      RT UPPER ARM  . Thyroid surgery      FOR THYROID CANCER  . Partial hysterectomy      bleeding, ovaries in place.    . Colonoscopy  2013  . Abdominal hysterectomy  1973    partial  . Breast lumpectomy  2011    left breast  . Breast biopsy Left 02-13-13    BENIGN BREAST TISSUE WITH CHANGES CONSISTENT WITH FAT NECROSIS  . Breast biopsy Left 1995    neg  . Breast excisional biopsy Left 2011    Breast cancer radiation  . Breast biopsy Left 01/21/2015    bx done in [facility] 11:00 left 6-8cmfn    Family History  Problem Relation Age of Onset  . Heart disease Mother   . Cancer Brother     lung     Social History Social History  Substance Use Topics  . Smoking status: Never Smoker   . Smokeless tobacco: Never Used  . Alcohol Use: 0.0 oz/week    0 Standard drinks or equivalent per week      Comment: social drinking. average times a week    Allergies  Allergen Reactions  . Atorvastatin     Other reaction(s): Other (See Comments) STIFFNESS AND SORE STIFFNESS AND SORE  . Lipitor [Atorvastatin Calcium] Other (See Comments)    Stiffness & soreness  . Penicillins Rash    REACTION: Unknown reaction    Current Outpatient Prescriptions  Medication Sig Dispense Refill  . Cholecalciferol (VITAMIN D) 1000 UNITS capsule Take 1,000 Units by mouth daily.      . DULoxetine (CYMBALTA) 60 MG capsule TAKE 1 CAPSULE AT BEDTIME. 90 capsule 0  . gabapentin (NEURONTIN) 300 MG capsule Take 2 capsules (600 mg total) by mouth at bedtime. 180 capsule 0  . levothyroxine (SYNTHROID, LEVOTHROID) 88 MCG tablet TAKE ONE TABLET BY MOUTH ONCE DAILY BEFORE BREAKFAST 90 tablet 3  . losartan-hydrochlorothiazide (HYZAAR) 100-12.5 MG tablet Take 1 tablet by mouth daily. 90 tablet 2  . lovastatin (MEVACOR) 40 MG tablet Take 1 tablet (40 mg total) by mouth daily. 90 tablet 2  . Multiple Vitamins-Minerals (PRESERVISION AREDS  2 PO) Take 2 tablets by mouth daily.      Marland Kitchen omeprazole (PRILOSEC) 20 MG capsule Take 1 capsule (20 mg total) by mouth daily. 90 capsule 3  . propranolol (INDERAL) 10 MG tablet Take 1 tablet (10 mg total) by mouth daily as needed. 90 tablet 0  . warfarin (COUMADIN) 3 MG tablet Take 1 tablet (3 mg total) by mouth daily. 90 tablet 1   No current facility-administered medications for this visit.    Review of Systems Review of Systems  Constitutional: Negative.   Respiratory: Negative.   Cardiovascular: Negative.     Blood pressure 108/58, pulse 70, resp. rate 12, height 5\' 6"  (1.676 m), weight 185 lb (83.915 kg).  Physical Exam Physical Exam  Constitutional: She is oriented to person, place, and time. She appears well-developed and well-nourished.  Eyes: Conjunctivae are normal. No scleral icterus.  Neck: Neck supple.  Cardiovascular: Normal rate, regular rhythm and normal  heart sounds.   Pulmonary/Chest: Effort normal and breath sounds normal. Right breast exhibits no inverted nipple, no mass, no nipple discharge, no skin change and no tenderness. Left breast exhibits no inverted nipple, no mass, no nipple discharge, no skin change and no tenderness. Breasts are asymmetrical ( right breast 2 cup size bigger than left  ).    Mark scaring on left breast.,  Lymphadenopathy:    She has no cervical adenopathy.    She has no axillary adenopathy.  Neurological: She is alert and oriented to person, place, and time.  Skin: Skin is warm and dry.    Data Reviewed Mammogram of 01/13/2016 was reviewed. Recommendation for MRI reviewed.    Assessment    Progressive breast distortion now 6 years status post treatment for invasive lobular carcinoma, multiple previous biopsy showing fat necrosis.    Plan    Options for management reviewed: 1 proceed with MRI as recommended by radiology versus 2) follow-up mammogram in 1 year.  My index of suspicion on review of the & the patient's clinical exam is low.  At this time the patient will undertake observation. She was encouraged to call if she did change her mind and desired to proceed with a breast MRI.    Patient  to return in one year bilateral diagnotic mammogram. PCP:  Einar Pheasant This information has been scribed by Gaspar Cola CMA.    Robert Bellow 01/18/2016, 9:15 PM

## 2016-01-18 NOTE — Patient Instructions (Signed)
The patient has been asked to return to the office in one year with a bilateral diagnostic mammogram. 

## 2016-01-28 ENCOUNTER — Other Ambulatory Visit: Payer: Medicare Other

## 2016-01-29 ENCOUNTER — Other Ambulatory Visit (INDEPENDENT_AMBULATORY_CARE_PROVIDER_SITE_OTHER): Payer: Medicare Other

## 2016-01-29 DIAGNOSIS — R739 Hyperglycemia, unspecified: Secondary | ICD-10-CM | POA: Diagnosis not present

## 2016-01-29 DIAGNOSIS — I4891 Unspecified atrial fibrillation: Secondary | ICD-10-CM | POA: Diagnosis not present

## 2016-01-29 LAB — PROTIME-INR
INR: 2.6 ratio — AB (ref 0.8–1.0)
Prothrombin Time: 28.2 s — ABNORMAL HIGH (ref 9.6–13.1)

## 2016-01-29 LAB — HEMOGLOBIN A1C: Hgb A1c MFr Bld: 6.3 % (ref 4.6–6.5)

## 2016-02-15 ENCOUNTER — Other Ambulatory Visit (INDEPENDENT_AMBULATORY_CARE_PROVIDER_SITE_OTHER): Payer: Medicare Other

## 2016-02-15 DIAGNOSIS — L570 Actinic keratosis: Secondary | ICD-10-CM | POA: Diagnosis not present

## 2016-02-15 DIAGNOSIS — L821 Other seborrheic keratosis: Secondary | ICD-10-CM | POA: Diagnosis not present

## 2016-02-15 DIAGNOSIS — Z08 Encounter for follow-up examination after completed treatment for malignant neoplasm: Secondary | ICD-10-CM | POA: Diagnosis not present

## 2016-02-15 DIAGNOSIS — Z7901 Long term (current) use of anticoagulants: Secondary | ICD-10-CM | POA: Diagnosis not present

## 2016-02-15 DIAGNOSIS — Z1283 Encounter for screening for malignant neoplasm of skin: Secondary | ICD-10-CM | POA: Diagnosis not present

## 2016-02-15 DIAGNOSIS — Z85820 Personal history of malignant melanoma of skin: Secondary | ICD-10-CM | POA: Diagnosis not present

## 2016-02-15 DIAGNOSIS — H61031 Chondritis of right external ear: Secondary | ICD-10-CM | POA: Diagnosis not present

## 2016-02-15 LAB — PROTIME-INR
INR: 3.4 ratio — AB (ref 0.8–1.0)
Prothrombin Time: 37.1 s — ABNORMAL HIGH (ref 9.6–13.1)

## 2016-02-18 DIAGNOSIS — C50012 Malignant neoplasm of nipple and areola, left female breast: Secondary | ICD-10-CM | POA: Diagnosis not present

## 2016-02-19 ENCOUNTER — Other Ambulatory Visit (INDEPENDENT_AMBULATORY_CARE_PROVIDER_SITE_OTHER): Payer: Medicare Other

## 2016-02-19 DIAGNOSIS — Z7901 Long term (current) use of anticoagulants: Secondary | ICD-10-CM | POA: Diagnosis not present

## 2016-02-19 LAB — PROTIME-INR
INR: 3.3 ratio — ABNORMAL HIGH (ref 0.8–1.0)
PROTHROMBIN TIME: 35.5 s — AB (ref 9.6–13.1)

## 2016-02-24 ENCOUNTER — Other Ambulatory Visit (INDEPENDENT_AMBULATORY_CARE_PROVIDER_SITE_OTHER): Payer: Medicare Other

## 2016-02-24 DIAGNOSIS — Z7901 Long term (current) use of anticoagulants: Secondary | ICD-10-CM | POA: Diagnosis not present

## 2016-02-24 NOTE — Addendum Note (Signed)
Addended by: Karlene Einstein D on: 02/24/2016 12:21 PM   Modules accepted: Orders

## 2016-03-15 ENCOUNTER — Telehealth: Payer: Self-pay | Admitting: Internal Medicine

## 2016-03-15 ENCOUNTER — Other Ambulatory Visit: Payer: Self-pay | Admitting: Internal Medicine

## 2016-03-15 MED ORDER — WARFARIN SODIUM 3 MG PO TABS: 3.0000 mg | ORAL_TABLET | Freq: Every day | ORAL | Status: DC

## 2016-03-15 MED ORDER — WARFARIN SODIUM 3 MG PO TABS: ORAL_TABLET | ORAL | Status: DC

## 2016-03-15 NOTE — Telephone Encounter (Signed)
Pt called about needing rx the warfarin (COUMADIN) 3 MG tablet  To say take 1 3 mg tablet each day and on Wednesday she takes 1 and 1 half tablet. Pt only has enough medication for tonight she needs to pick up tomorrow.   Pharmacy is [id number] - [address], [place].  Call pt @ [phone]. Thank you!

## 2016-03-15 NOTE — Telephone Encounter (Signed)
rx sent in for coumadin - take 1 and 1/2 tablet on Wednesday and one tablet all other days of the week.  #100 with one refill.  Please notify pt.

## 2016-03-16 NOTE — Telephone Encounter (Signed)
Patient has been notified of refill of medication.

## 2016-03-18 ENCOUNTER — Other Ambulatory Visit (INDEPENDENT_AMBULATORY_CARE_PROVIDER_SITE_OTHER): Payer: Medicare Other

## 2016-03-18 DIAGNOSIS — Z7901 Long term (current) use of anticoagulants: Secondary | ICD-10-CM | POA: Diagnosis not present

## 2016-03-18 LAB — PROTIME-INR
INR: 2.1 ratio — ABNORMAL HIGH (ref 0.8–1.0)
Prothrombin Time: 22.9 s — ABNORMAL HIGH (ref 9.6–13.1)

## 2016-03-23 DIAGNOSIS — H353114 Nonexudative age-related macular degeneration, right eye, advanced atrophic with subfoveal involvement: Secondary | ICD-10-CM | POA: Diagnosis not present

## 2016-03-24 DIAGNOSIS — H353114 Nonexudative age-related macular degeneration, right eye, advanced atrophic with subfoveal involvement: Secondary | ICD-10-CM | POA: Diagnosis not present

## 2016-03-28 DIAGNOSIS — H353221 Exudative age-related macular degeneration, left eye, with active choroidal neovascularization: Secondary | ICD-10-CM | POA: Diagnosis not present

## 2016-03-28 DIAGNOSIS — H353114 Nonexudative age-related macular degeneration, right eye, advanced atrophic with subfoveal involvement: Secondary | ICD-10-CM | POA: Diagnosis not present

## 2016-04-04 ENCOUNTER — Other Ambulatory Visit (INDEPENDENT_AMBULATORY_CARE_PROVIDER_SITE_OTHER): Payer: Medicare Other

## 2016-04-04 DIAGNOSIS — Z7901 Long term (current) use of anticoagulants: Secondary | ICD-10-CM

## 2016-04-04 LAB — PROTIME-INR
INR: 1.9 ratio — AB (ref 0.8–1.0)
Prothrombin Time: 20 s — ABNORMAL HIGH (ref 9.6–13.1)

## 2016-04-13 ENCOUNTER — Other Ambulatory Visit: Payer: Self-pay | Admitting: Internal Medicine

## 2016-04-13 ENCOUNTER — Other Ambulatory Visit: Payer: Self-pay

## 2016-04-13 DIAGNOSIS — Z7901 Long term (current) use of anticoagulants: Secondary | ICD-10-CM

## 2016-04-14 ENCOUNTER — Other Ambulatory Visit: Payer: Medicare Other

## 2016-04-20 ENCOUNTER — Ambulatory Visit (INDEPENDENT_AMBULATORY_CARE_PROVIDER_SITE_OTHER): Payer: Medicare Other | Admitting: Family Medicine

## 2016-04-20 ENCOUNTER — Other Ambulatory Visit: Payer: Medicare Other

## 2016-04-20 ENCOUNTER — Telehealth: Payer: Self-pay | Admitting: Surgical

## 2016-04-20 DIAGNOSIS — Z7901 Long term (current) use of anticoagulants: Secondary | ICD-10-CM

## 2016-04-20 DIAGNOSIS — J069 Acute upper respiratory infection, unspecified: Secondary | ICD-10-CM

## 2016-04-20 DIAGNOSIS — F439 Reaction to severe stress, unspecified: Secondary | ICD-10-CM

## 2016-04-20 DIAGNOSIS — Z658 Other specified problems related to psychosocial circumstances: Secondary | ICD-10-CM

## 2016-04-20 LAB — PROTIME-INR
INR: 1.8 ratio — AB (ref 0.8–1.0)
PROTHROMBIN TIME: 19.2 s — AB (ref 9.6–13.1)

## 2016-04-20 MED ORDER — FLUTICASONE PROPIONATE 50 MCG/ACT NA SUSP: 2.0000 | Freq: Every day | NASAL | 6 refills | Status: DC

## 2016-04-20 MED ORDER — LORATADINE 10 MG PO TABS: 10.0000 mg | ORAL_TABLET | Freq: Every day | ORAL | 11 refills | Status: DC

## 2016-04-20 NOTE — Assessment & Plan Note (Signed)
Patient's symptoms most consistent with viral upper respiratory infection. No focal findings indicate bacterial infection. Patient is neurologically intact and suspect headache is likely related to viral illness. Discussed continuing to monitor and supportive care. Can use over-the-counter Flonase and Claritin. She'll continue to monitor symptoms. If not improving over the next 3-4 days she will follow-up. She is given return precautions.

## 2016-04-20 NOTE — Progress Notes (Signed)
  Tommi Rumps, MD Phone: [phone]  Kerri Mills is a 82 y.o. female who presents today for same-day visit.  Patient notes 4-5 days of sore throat and postnasal drip. Had some right-sided upper facial discomfort with teeth pain initially though this resolved. Notes a dull headache as well with this. Has had some chills though no documented fever. Some mild sweats with the chills though this only occurred when she was outside. Had some cough last night productive of yellow-green mucus. Has had postnasal drip. No contacts with similar symptoms. No chest pain. Possible minimal shortness of breath earlier today though breathing fine now. Has gargled with warm salt water and has taken Tylenol with little benefit. Patient thinks she may be wearing down from caring for one of her friends that has cancer. Has been taking care of her for the last 4 years. Notes some mild depression with this though no thoughts of harming herself. She's currently on Cymbalta and gabapentin and feels these are significantly beneficial.  ROS see history of present illness  Objective  Physical Exam Vitals:   04/20/16 1317  BP: 122/76  Pulse: 80  Temp: 98.4 F (36.9 C)    BP Readings from Last 3 Encounters:  04/20/16 122/76  01/18/16 (!) 108/58  07/08/15 110/70   Wt Readings from Last 3 Encounters:  04/20/16 184 lb 12.8 oz (83.8 kg)  01/18/16 185 lb (83.9 kg)  07/08/15 183 lb 4 oz (83.1 kg)    Physical Exam  Constitutional: No distress.  HENT:  Head: Normocephalic and atraumatic.  Right Ear: External ear normal.  Left Ear: External ear normal.  Mild posterior oropharyngeal erythema with postnasal drip, normal TMs bilaterally  Eyes: Conjunctivae are normal. Pupils are equal, round, and reactive to light.  Neck: Neck supple.  Cardiovascular: Normal rate, regular rhythm and normal heart sounds.   Pulmonary/Chest: Effort normal and breath sounds normal.  Musculoskeletal: She exhibits no edema.    Lymphadenopathy:    She has no cervical adenopathy.  Neurological: She is alert.  CN 2-12 intact, 5/5 strength in bilateral biceps, triceps, grip, quads, hamstrings, plantar and dorsiflexion, sensation to light touch intact in bilateral UE and LE, normal gait, 2+ patellar reflexes  Skin: Skin is warm and dry. She is not diaphoretic.  Psychiatric:  Mood mildly depressed, affect mildly depressed      Assessment/Plan: Please see individual problem list.  Viral upper respiratory infection Patient's symptoms most consistent with viral upper respiratory infection. No focal findings indicate bacterial infection. Patient is neurologically intact and suspect headache is likely related to viral illness. Discussed continuing to monitor and supportive care. Can use over-the-counter Flonase and Claritin. She'll continue to monitor symptoms. If not improving over the next 3-4 days she will follow-up. She is given return precautions.  Stress Slightly worse recently with worsening of her friends condition. No SI. Continue Cymbalta. If worsening she will let us   No orders of the defined types were placed in this encounter.   Meds ordered this encounter  Medications  . fluticasone (FLONASE) 50 MCG/ACT nasal spray    Sig: Place 2 sprays into both nostrils daily.    Dispense:  16 g    Refill:  6  . loratadine (CLARITIN) 10 MG tablet    Sig: Take 1 tablet (10 mg total) by mouth daily.    Dispense:  30 tablet    Refill:  [place], MD [place]

## 2016-04-20 NOTE — Progress Notes (Signed)
Pre visit review using our clinic review tool, if applicable. No additional management support is needed unless otherwise documented below in the visit note. 

## 2016-04-20 NOTE — Telephone Encounter (Signed)
Patient dropped off handicap form to be filled out. Will put in Dr. Nicki Reaper box

## 2016-04-20 NOTE — Patient Instructions (Addendum)
Nice to meet you. Your symptoms are likely related to a viral illness. We will treat you with Flonase and Claritin. If you're not improving in the next 3-4 days please follow-up. If you develop fevers, chest pain, shortness of breath, cough productive of blood, worsening headaches, numbness, weakness, vision changes, or any new or changing symptoms please seek medical attention immediately.

## 2016-04-20 NOTE — Assessment & Plan Note (Signed)
Slightly worse recently with worsening of her friends condition. No SI. Continue Cymbalta. If worsening she will let us

## 2016-04-21 NOTE — Telephone Encounter (Signed)
Have not received form

## 2016-04-21 NOTE — Telephone Encounter (Signed)
Form mailed to pt at her request

## 2016-04-22 ENCOUNTER — Telehealth: Payer: Self-pay | Admitting: *Deleted

## 2016-04-22 NOTE — Telephone Encounter (Signed)
-----   Message from Einar Pheasant, MD sent at 04/22/2016  4:48 AM EDT ----- Have her change her coumadin to 4.5mg  on Wednesday and Sunday and 3 mg all other days.  Recheck pt/inr in 10 -14 days.

## 2016-04-22 NOTE — Telephone Encounter (Signed)
Updated Coumadin directions

## 2016-04-25 DIAGNOSIS — H353221 Exudative age-related macular degeneration, left eye, with active choroidal neovascularization: Secondary | ICD-10-CM | POA: Diagnosis not present

## 2016-05-05 ENCOUNTER — Other Ambulatory Visit: Payer: Self-pay

## 2016-05-05 DIAGNOSIS — Z7901 Long term (current) use of anticoagulants: Secondary | ICD-10-CM

## 2016-05-06 ENCOUNTER — Other Ambulatory Visit (INDEPENDENT_AMBULATORY_CARE_PROVIDER_SITE_OTHER): Payer: Medicare Other

## 2016-05-06 DIAGNOSIS — Z7901 Long term (current) use of anticoagulants: Secondary | ICD-10-CM

## 2016-05-07 LAB — PROTIME-INR
INR: 2.1 — ABNORMAL HIGH
Prothrombin Time: 22.1 s — ABNORMAL HIGH (ref 9.0–11.5)

## 2016-05-08 ENCOUNTER — Other Ambulatory Visit: Payer: Self-pay | Admitting: Internal Medicine

## 2016-05-08 DIAGNOSIS — Z7901 Long term (current) use of anticoagulants: Secondary | ICD-10-CM

## 2016-05-10 ENCOUNTER — Telehealth: Payer: Self-pay | Admitting: *Deleted

## 2016-05-10 DIAGNOSIS — M79605 Pain in left leg: Secondary | ICD-10-CM

## 2016-05-10 NOTE — Telephone Encounter (Signed)
Patient currently has pain in her inner groin and down her left legand her buttock.   She requested to have a referral to [facility] clinic, to receive an injection Pt contact [phone]

## 2016-05-11 NOTE — Telephone Encounter (Signed)
Does she have a preference of which doctor to see.  (ortho? Rheumatology?  Name?).  I can place the order.  If needs something more acutely, can be evaluated here.

## 2016-05-11 NOTE — Telephone Encounter (Signed)
Order placed for ortho referral.   

## 2016-05-11 NOTE — Telephone Encounter (Signed)
Pt would like to see Dr. Marry Guan or Dr. Rogers Blocker

## 2016-05-23 DIAGNOSIS — H353221 Exudative age-related macular degeneration, left eye, with active choroidal neovascularization: Secondary | ICD-10-CM | POA: Diagnosis not present

## 2016-05-24 ENCOUNTER — Other Ambulatory Visit (INDEPENDENT_AMBULATORY_CARE_PROVIDER_SITE_OTHER): Payer: Medicare Other

## 2016-05-24 DIAGNOSIS — Z7901 Long term (current) use of anticoagulants: Secondary | ICD-10-CM | POA: Diagnosis not present

## 2016-05-24 LAB — PROTIME-INR
INR: 3.3 ratio — AB (ref 0.8–1.0)
PROTHROMBIN TIME: 35.7 s — AB (ref 9.6–13.1)

## 2016-05-25 DIAGNOSIS — M5442 Lumbago with sciatica, left side: Secondary | ICD-10-CM | POA: Diagnosis not present

## 2016-05-25 DIAGNOSIS — M533 Sacrococcygeal disorders, not elsewhere classified: Secondary | ICD-10-CM | POA: Diagnosis not present

## 2016-06-01 ENCOUNTER — Other Ambulatory Visit: Payer: Medicare Other

## 2016-06-02 ENCOUNTER — Other Ambulatory Visit (INDEPENDENT_AMBULATORY_CARE_PROVIDER_SITE_OTHER): Payer: Medicare Other

## 2016-06-02 DIAGNOSIS — Z5181 Encounter for therapeutic drug level monitoring: Secondary | ICD-10-CM | POA: Diagnosis not present

## 2016-06-02 DIAGNOSIS — Z7901 Long term (current) use of anticoagulants: Secondary | ICD-10-CM

## 2016-06-02 LAB — PROTIME-INR
INR: 3.1 ratio — ABNORMAL HIGH (ref 0.8–1.0)
PROTHROMBIN TIME: 34 s — AB (ref 9.6–13.1)

## 2016-06-03 ENCOUNTER — Other Ambulatory Visit: Payer: Self-pay | Admitting: Internal Medicine

## 2016-06-03 DIAGNOSIS — Z7901 Long term (current) use of anticoagulants: Secondary | ICD-10-CM

## 2016-06-10 ENCOUNTER — Other Ambulatory Visit: Payer: Self-pay | Admitting: Internal Medicine

## 2016-06-10 ENCOUNTER — Other Ambulatory Visit (INDEPENDENT_AMBULATORY_CARE_PROVIDER_SITE_OTHER): Payer: Medicare Other

## 2016-06-10 DIAGNOSIS — Z7901 Long term (current) use of anticoagulants: Secondary | ICD-10-CM | POA: Diagnosis not present

## 2016-06-10 DIAGNOSIS — Z5181 Encounter for therapeutic drug level monitoring: Secondary | ICD-10-CM | POA: Diagnosis not present

## 2016-06-10 LAB — PROTIME-INR
INR: 3.1 ratio — ABNORMAL HIGH (ref 0.8–1.0)
PROTHROMBIN TIME: 33.6 s — AB (ref 9.6–13.1)

## 2016-06-13 ENCOUNTER — Telehealth: Payer: Self-pay | Admitting: *Deleted

## 2016-06-13 ENCOUNTER — Ambulatory Visit (INDEPENDENT_AMBULATORY_CARE_PROVIDER_SITE_OTHER): Payer: Medicare Other | Admitting: Internal Medicine

## 2016-06-13 ENCOUNTER — Encounter: Payer: Self-pay | Admitting: Internal Medicine

## 2016-06-13 DIAGNOSIS — C73 Malignant neoplasm of thyroid gland: Secondary | ICD-10-CM | POA: Diagnosis not present

## 2016-06-13 DIAGNOSIS — Z853 Personal history of malignant neoplasm of breast: Secondary | ICD-10-CM

## 2016-06-13 DIAGNOSIS — K219 Gastro-esophageal reflux disease without esophagitis: Secondary | ICD-10-CM

## 2016-06-13 DIAGNOSIS — G4733 Obstructive sleep apnea (adult) (pediatric): Secondary | ICD-10-CM

## 2016-06-13 DIAGNOSIS — Z658 Other specified problems related to psychosocial circumstances: Secondary | ICD-10-CM

## 2016-06-13 DIAGNOSIS — F439 Reaction to severe stress, unspecified: Secondary | ICD-10-CM

## 2016-06-13 DIAGNOSIS — Z23 Encounter for immunization: Secondary | ICD-10-CM | POA: Diagnosis not present

## 2016-06-13 DIAGNOSIS — I4891 Unspecified atrial fibrillation: Secondary | ICD-10-CM

## 2016-06-13 DIAGNOSIS — R739 Hyperglycemia, unspecified: Secondary | ICD-10-CM

## 2016-06-13 DIAGNOSIS — D0360 Melanoma in situ of unspecified upper limb, including shoulder: Secondary | ICD-10-CM

## 2016-06-13 NOTE — Telephone Encounter (Signed)
Patient will need a three month follow up, along with labs

## 2016-06-13 NOTE — Progress Notes (Signed)
Pre visit review using our clinic review tool, if applicable. No additional management support is needed unless otherwise documented below in the visit note. 

## 2016-06-13 NOTE — Progress Notes (Signed)
Patient ID: Kerri Mills, female   DOB: [birth year], 82 y.o.   MRN: [id number]   Subjective:    Patient ID: Kerri Mills, female    DOB: May 12, 1931, 82 y.o.   MRN: [id number]  HPI  Patient here for a scheduled follow up.  States she is doing relatively well.  She did fall 2 months ago.  Saw [facility].  Has right SI dysfunction and left sciatica.  Was given a steroid dose pack.  Is better.  Still some discomfort.  Plans to f/u with ortho.  No chest pain.  Breathing stable.  No abdominal pain or cramping.  Bowels stable.     Past Medical History:  Diagnosis Date  . Allergy   . Anxiety   . Arthritis   . Breast CA ([place])   . Colitis   . Depression   . Diverticulitis 2013  . Gastric ulcer   . GERD (gastroesophageal reflux disease)   . Heart disease   . Hypercholesterolemia   . Hypertension   . Infiltrating lobular carcinoma of left breast 2011   T2,N0, ER: 90%; PR 0%; Her 2 neu not amplified. [facility]).  . Melanoma ([place]) 1997  . Melanoma in situ of upper extremity ([place]) 03/19/2011  . Seroma    HISTORY OF LFT BREAST  . Sleep apnea   . Thyroid cancer ([place]) 03/19/2011   Past Surgical History:  Procedure Laterality Date  . ABDOMINAL HYSTERECTOMY  1973   partial  . BREAST BIOPSY Left 02-13-13   BENIGN BREAST TISSUE WITH CHANGES CONSISTENT WITH FAT NECROSIS  . BREAST BIOPSY Left 1995   neg  . BREAST BIOPSY Left 01/21/2015   bx done in [facility] 11:00 left 6-8cmfn  . BREAST EXCISIONAL BIOPSY Left 2011   Breast cancer radiation  . BREAST LUMPECTOMY  2011   left breast  . CHOLECYSTECTOMY    . COLONOSCOPY  2013  . MELANOMA EXCISION     RT UPPER ARM  . PARTIAL HYSTERECTOMY     bleeding, ovaries in place.    . THYROID SURGERY     FOR THYROID CANCER  . TONSILLECTOMY     Family History  Problem Relation Age of Onset  . Heart disease Mother   . Cancer Brother     lung    Social History   Social History  . Marital status: Widowed    Spouse name: N/A  . Number  of children: N/A  . Years of education: N/A   Social History Main Topics  . Smoking status: Never Smoker  . Smokeless tobacco: Never Used  . Alcohol use 0.0 oz/week     Comment: social drinking. average times a week  . Drug use: No  . Sexual activity: No   Other Topics Concern  . None   Social History Narrative  . None    Outpatient Encounter Prescriptions as of 06/13/2016  Medication Sig  . Cholecalciferol (VITAMIN D) 1000 UNITS capsule Take 1,000 Units by mouth daily.    . fluticasone (FLONASE) 50 MCG/ACT nasal spray Place 2 sprays into both nostrils daily.  Marland Kitchen levothyroxine (SYNTHROID, LEVOTHROID) 88 MCG tablet TAKE ONE TABLET BY MOUTH ONCE DAILY BEFORE BREAKFAST  . loratadine (CLARITIN) 10 MG tablet Take 1 tablet (10 mg total) by mouth daily.  Marland Kitchen losartan-hydrochlorothiazide (HYZAAR) 100-12.5 MG tablet Take 1 tablet by mouth daily.  Marland Kitchen lovastatin (MEVACOR) 40 MG tablet Take 1 tablet (40 mg total) by mouth daily.  . Multiple Vitamins-Minerals (PRESERVISION AREDS 2  PO) Take 2 tablets by mouth daily.    Marland Kitchen omeprazole (PRILOSEC) 20 MG capsule Take 1 capsule (20 mg total) by mouth daily.  . propranolol (INDERAL) 10 MG tablet TAKE 1 TABLET (10 MG TOTAL) BY MOUTH DAILY AS NEEDED.  Marland Kitchen warfarin (COUMADIN) 3 MG tablet Take 1 and 1/2 tablet on Wednesday and one tablet all other days of the week. (Patient taking differently: Take 1 and 1/2 (4.'5mg'$ ) tablet on Wednesday & Sunday and 1 ('3mg'$ ) tablet all other days of the week.)  . warfarin (COUMADIN) 3 MG tablet TAKE 1 TABLET (3 MG TOTAL) BY MOUTH DAILY.  . [DISCONTINUED] DULoxetine (CYMBALTA) 60 MG capsule TAKE 1 CAPSULE BY MOUTH AT BEDTIME.  . [DISCONTINUED] gabapentin (NEURONTIN) 300 MG capsule TAKE 2 CAPSULES (600 MG TOTAL) BY MOUTH AT BEDTIME.   No facility-administered encounter medications on file as of 06/13/2016.     Review of Systems  Constitutional: Negative for appetite change and unexpected weight change.  HENT: Negative for  congestion and sinus pressure.   Respiratory: Negative for cough, chest tightness and shortness of breath.   Cardiovascular: Negative for chest pain, palpitations and leg swelling.  Gastrointestinal: Negative for abdominal pain, diarrhea, nausea and vomiting.  Genitourinary: Negative for difficulty urinating and dysuria.  Musculoskeletal: Positive for back pain. Negative for joint swelling.       Persistent discomfort s/p fall - as outlined.   Skin: Negative for color change and rash.  Neurological: Negative for dizziness, light-headedness and headaches.  Psychiatric/Behavioral: Negative for agitation and dysphoric mood.       Objective:    Physical Exam  Constitutional: She appears well-developed and well-nourished. No distress.  HENT:  Nose: Nose normal.  Mouth/Throat: Oropharynx is clear and moist.  Neck: Neck supple. No thyromegaly present.  Cardiovascular: Normal rate and regular rhythm.   Pulmonary/Chest: Breath sounds normal. No respiratory distress. She has no wheezes.  Abdominal: Soft. Bowel sounds are normal. There is no tenderness.  Musculoskeletal: She exhibits no edema or tenderness.  Lymphadenopathy:    She has no cervical adenopathy.  Skin: No rash noted. No erythema.  Psychiatric: She has a normal mood and affect. Her behavior is normal.    BP 118/70   Pulse 65   Temp 98.4 F (36.9 C) (Oral)   Wt 184 lb 6.4 oz (83.6 kg)   SpO2 96%   BMI 29.76 kg/m  Wt Readings from Last 3 Encounters:  06/13/16 184 lb 6.4 oz (83.6 kg)  04/20/16 184 lb 12.8 oz (83.8 kg)  01/18/16 185 lb (83.9 kg)     Lab Results  Component Value Date   WBC 4.5 05/05/2015   HGB 12.1 05/05/2015   HCT 36.7 05/05/2015   PLT 214.0 05/05/2015   GLUCOSE 124 (H) 12/31/2015   CHOL 185 12/31/2015   TRIG 259.0 (H) 12/31/2015   HDL 36.70 (L) 12/31/2015   LDLDIRECT 92.0 12/31/2015   LDLCALC 107 (H) 01/03/2014   ALT 13 12/31/2015   AST 16 12/31/2015   NA 141 12/31/2015   K 3.7 12/31/2015     CL 103 12/31/2015   CREATININE 0.87 12/31/2015   BUN 14 12/31/2015   CO2 32 12/31/2015   TSH 2.73 12/31/2015   INR 3.1 (H) 06/10/2016   HGBA1C 6.3 01/29/2016    Mm Diag Breast Tomo Bilateral  Result Date: 01/13/2016 CLINICAL DATA:  82 year old female with history of left breast lumpectomy in 2011. She has had biopsies at 2 sites in the left breast at 7 o'clock and  11 o'clock, both with benign results in 2016. No current problems. EXAM: 2D DIGITAL DIAGNOSTIC BILATERAL MAMMOGRAM WITH CAD AND ADJUNCT TOMO COMPARISON:  Previous exam(s). ACR Breast Density Category b: There are scattered areas of fibroglandular density. FINDINGS: The lumpectomy site in the upper-outer left breast again identified. On the MLO view, there is interval mild increased density along the lower posterior margin of the lumpectomy site with a few developing calcifications. Similarly in the inferior left breast, there is a developing asymmetry with increasing coarse heterogeneous calcifications. On the tomosynthesis image, there does appear to be fat within this asymmetry, suggesting fat necrosis. No other suspicious calcifications, masses or areas of distortion are seen in the bilateral breasts. Mammographic images were processed with CAD. IMPRESSION: 1. Increasing focal asymmetries along the posterior inferior margin of the lumpectomy site, and in the inferior aspect of the left breast, with mammographic appearance suggestive of fat necrosis. 2.  No mammographic evidence of malignancy in the right breast. RECOMMENDATION: 1. MRI is recommended for further evaluation of the developing asymmetries in the left breast, which are favored to represent fat necrosis. The patient has had multiple prior benign biopsies of the left breast, with 2 of these results indicating fat necrosis. The mammographic appearance suggests that these asymmetries may also represent fat necrosis. If no suspicious findings are seen on MRI, then a short interval  follow-up mammogram in 6 months, is recommended to ensure continued expected evolution of these asymmetries. I have discussed the findings and recommendations with the patient. Results were also provided in writing at the conclusion of the visit. If applicable, a reminder letter will be sent to the patient regarding the next appointment. BI-RADS CATEGORY  0: Incomplete. Need additional imaging evaluation and/or prior mammograms for comparison. Electronically Signed   By: Ammie Ferrier M.D.   On: 01/13/2016 13:02       Assessment & Plan:   Problem List Items Addressed This Visit    Atrial fibrillation ([place])    On coumadin.  Follow pt/inr.        Relevant Orders   CBC with Differential/Platelet   Protime-INR   GERD (gastroesophageal reflux disease)    On omeprazole.  Reflux controlled.        History of breast cancer    Had mammogram in 12/2015.  Radiology recommended MRI.  Saw Dr Bary Castilla.  Plans f/u with yearly mammogram.  See note.       Hyperglycemia    Low carb diet and exercise.  Follow met b and a1c.       Relevant Orders   Lipid panel   Hemoglobin D6U   Basic metabolic panel   Melanoma in situ of upper extremity ([place])    Followed by dermatology.       Obstructive sleep apnea    CPAP.       Stress    Stable on cymbalta.  Follow.       Thyroid cancer ([place])    On thyroid replacement.  Follow tsh.       Relevant Orders   Hepatic function panel    Other Visit Diagnoses    Encounter for immunization       Relevant Orders   Flu vaccine HIGH DOSE PF (Completed)       Einar Pheasant, MD

## 2016-06-14 NOTE — Telephone Encounter (Signed)
No vm will try again later

## 2016-06-16 DIAGNOSIS — M461 Sacroiliitis, not elsewhere classified: Secondary | ICD-10-CM | POA: Diagnosis not present

## 2016-06-17 ENCOUNTER — Other Ambulatory Visit: Payer: Self-pay

## 2016-06-17 ENCOUNTER — Telehealth: Payer: Self-pay | Admitting: *Deleted

## 2016-06-17 MED ORDER — DULOXETINE HCL 60 MG PO CPEP: 60.0000 mg | ORAL_CAPSULE | Freq: Every day | ORAL | 1 refills | Status: DC

## 2016-06-17 MED ORDER — GABAPENTIN 300 MG PO CAPS: ORAL_CAPSULE | ORAL | 1 refills | Status: DC

## 2016-06-17 NOTE — Telephone Encounter (Signed)
rx for cymbalta #90 with one refill and gabapentin #180 with 1 refill have been ok's and sent to pharmacy.

## 2016-06-17 NOTE — Telephone Encounter (Signed)
Patient called [facility] last night, stated that she was seen this week and told that her Rx's would be sent in, requesting Gabapentin 300mg  and Cymbalta 60mg .  I looked at OV notes, please advise for refills on these meds. thanks

## 2016-06-17 NOTE — Telephone Encounter (Signed)
I sent this to the PCP already based off a [employer] call, thanks

## 2016-06-17 NOTE — Telephone Encounter (Signed)
okay

## 2016-06-17 NOTE — Telephone Encounter (Signed)
Pt has requested a medication refill for gabapentin and [place].

## 2016-06-18 ENCOUNTER — Encounter: Payer: Self-pay | Admitting: Internal Medicine

## 2016-06-18 DIAGNOSIS — R739 Hyperglycemia, unspecified: Secondary | ICD-10-CM | POA: Insufficient documentation

## 2016-06-18 NOTE — Assessment & Plan Note (Signed)
On omeprazole.  Reflux controlled.

## 2016-06-18 NOTE — Assessment & Plan Note (Signed)
Low carb diet and exercise.  Follow met b and a1c.  

## 2016-06-18 NOTE — Assessment & Plan Note (Signed)
CPAP.  

## 2016-06-18 NOTE — Assessment & Plan Note (Signed)
Stable on cymbalta.  Follow.  

## 2016-06-18 NOTE — Assessment & Plan Note (Signed)
On thyroid replacement.  Follow tsh.  

## 2016-06-18 NOTE — Assessment & Plan Note (Signed)
On coumadin.  Follow pt/inr.  

## 2016-06-18 NOTE — Assessment & Plan Note (Signed)
Had mammogram in 12/2015.  Radiology recommended MRI.  Saw Dr Bary Castilla.  Plans f/u with yearly mammogram.  See note.

## 2016-06-18 NOTE — Assessment & Plan Note (Signed)
Followed by dermatology

## 2016-06-20 ENCOUNTER — Other Ambulatory Visit (INDEPENDENT_AMBULATORY_CARE_PROVIDER_SITE_OTHER): Payer: Medicare Other

## 2016-06-20 DIAGNOSIS — I4891 Unspecified atrial fibrillation: Secondary | ICD-10-CM

## 2016-06-20 DIAGNOSIS — C73 Malignant neoplasm of thyroid gland: Secondary | ICD-10-CM

## 2016-06-20 DIAGNOSIS — R739 Hyperglycemia, unspecified: Secondary | ICD-10-CM | POA: Diagnosis not present

## 2016-06-20 LAB — HEPATIC FUNCTION PANEL
ALT: 13 U/L (ref 0–35)
AST: 13 U/L (ref 0–37)
Albumin: 3.6 g/dL (ref 3.5–5.2)
Alkaline Phosphatase: 95 U/L (ref 39–117)
BILIRUBIN DIRECT: 0 mg/dL (ref 0.0–0.3)
BILIRUBIN TOTAL: 0.4 mg/dL (ref 0.2–1.2)
Total Protein: 6.5 g/dL (ref 6.0–8.3)

## 2016-06-20 LAB — CBC WITH DIFFERENTIAL/PLATELET
BASOS ABS: 0 10*3/uL (ref 0.0–0.1)
Basophils Relative: 0.2 % (ref 0.0–3.0)
EOS ABS: 0.1 10*3/uL (ref 0.0–0.7)
Eosinophils Relative: 1.8 % (ref 0.0–5.0)
HCT: 36.5 % (ref 36.0–46.0)
Hemoglobin: 12.2 g/dL (ref 12.0–15.0)
LYMPHS PCT: 25.9 % (ref 12.0–46.0)
Lymphs Abs: 1.5 10*3/uL (ref 0.7–4.0)
MCHC: 33.4 g/dL (ref 30.0–36.0)
MCV: 91.2 fl (ref 78.0–100.0)
MONOS PCT: 8.8 % (ref 3.0–12.0)
Monocytes Absolute: 0.5 10*3/uL (ref 0.1–1.0)
Neutro Abs: 3.6 10*3/uL (ref 1.4–7.7)
Neutrophils Relative %: 63.3 % (ref 43.0–77.0)
Platelets: 247 10*3/uL (ref 150.0–400.0)
RBC: 4.01 Mil/uL (ref 3.87–5.11)
RDW: 13.2 % (ref 11.5–15.5)
WBC: 5.8 10*3/uL (ref 4.0–10.5)

## 2016-06-20 LAB — BASIC METABOLIC PANEL
BUN: 12 mg/dL (ref 6–23)
CHLORIDE: 103 meq/L (ref 96–112)
CO2: 35 mEq/L — ABNORMAL HIGH (ref 19–32)
Calcium: 9.4 mg/dL (ref 8.4–10.5)
Creatinine, Ser: 0.84 mg/dL (ref 0.40–1.20)
GFR: 68.82 mL/min (ref >60.00)
Glucose, Bld: 95 mg/dL (ref 70–99)
POTASSIUM: 3.8 meq/L (ref 3.5–5.1)
SODIUM: 142 meq/L (ref 135–145)

## 2016-06-20 LAB — LIPID PANEL
CHOL/HDL RATIO: 4
Cholesterol: 175 mg/dL (ref 0–200)
HDL: 47.2 mg/dL (ref >39.00)
LDL Cholesterol: 91 mg/dL (ref 0–99)
NONHDL: 128.23
TRIGLYCERIDES: 186 mg/dL — AB (ref 0.0–149.0)
VLDL: 37.2 mg/dL (ref 0.0–40.0)

## 2016-06-20 LAB — PROTIME-INR
INR: 3.1 ratio — AB (ref 0.8–1.0)
PROTHROMBIN TIME: 33.9 s — AB (ref 9.6–13.1)

## 2016-06-20 LAB — HEMOGLOBIN A1C: Hgb A1c MFr Bld: 6.4 % (ref 4.6–6.5)

## 2016-06-22 ENCOUNTER — Other Ambulatory Visit: Payer: Self-pay | Admitting: Internal Medicine

## 2016-06-22 DIAGNOSIS — H353221 Exudative age-related macular degeneration, left eye, with active choroidal neovascularization: Secondary | ICD-10-CM | POA: Diagnosis not present

## 2016-06-22 DIAGNOSIS — Z7901 Long term (current) use of anticoagulants: Secondary | ICD-10-CM

## 2016-06-30 ENCOUNTER — Other Ambulatory Visit: Payer: Medicare Other

## 2016-06-30 DIAGNOSIS — H353114 Nonexudative age-related macular degeneration, right eye, advanced atrophic with subfoveal involvement: Secondary | ICD-10-CM | POA: Diagnosis not present

## 2016-07-01 ENCOUNTER — Ambulatory Visit (INDEPENDENT_AMBULATORY_CARE_PROVIDER_SITE_OTHER): Payer: Medicare Other

## 2016-07-01 ENCOUNTER — Other Ambulatory Visit (INDEPENDENT_AMBULATORY_CARE_PROVIDER_SITE_OTHER): Payer: Medicare Other

## 2016-07-01 VITALS — BP 110/70 | HR 78 | Temp 98.0°F | Resp 14 | Ht 65.0 in | Wt 182.8 lb

## 2016-07-01 DIAGNOSIS — Z5181 Encounter for therapeutic drug level monitoring: Secondary | ICD-10-CM

## 2016-07-01 DIAGNOSIS — Z7901 Long term (current) use of anticoagulants: Secondary | ICD-10-CM | POA: Diagnosis not present

## 2016-07-01 DIAGNOSIS — Z0000 Encounter for general adult medical examination without abnormal findings: Secondary | ICD-10-CM | POA: Diagnosis not present

## 2016-07-01 LAB — PROTIME-INR
INR: 2.4 ratio — ABNORMAL HIGH (ref 0.8–1.0)
PROTHROMBIN TIME: 25.5 s — AB (ref 9.6–13.1)

## 2016-07-01 NOTE — Progress Notes (Signed)
Subjective:   Kerri Mills is a 82 y.o. female who presents for Medicare Annual (Subsequent) preventive examination.  Review of Systems:  No ROS.  Medicare Wellness Visit.  Cardiac Risk Factors include: advanced age (>27men, >84 women);hypertension     Objective:     Vitals: BP 110/70 (BP Location: Right Arm, Patient Position: Sitting, Cuff Size: Normal)   Pulse 78   Temp 98 F (36.7 C) (Oral)   Resp 14   Ht 5\' 5"  (1.651 m)   Wt 182 lb 12.8 oz (82.9 kg)   SpO2 98%   BMI 30.42 kg/m   Body mass index is 30.42 kg/m.   Tobacco History  Smoking Status  . Never Smoker  Smokeless Tobacco  . Never Used     Counseling given: Not Answered   Past Medical History:  Diagnosis Date  . Allergy   . Anxiety   . Arthritis   . Breast CA ([place])   . Colitis   . Depression   . Diverticulitis 2013  . Gastric ulcer   . GERD (gastroesophageal reflux disease)   . Heart disease   . Hypercholesterolemia   . Hypertension   . Infiltrating lobular carcinoma of left breast 2011   T2,N0, ER: 90%; PR 0%; Her 2 neu not amplified. [facility]).  . Melanoma ([place]) 1997  . Melanoma in situ of upper extremity ([place]) 03/19/2011  . Seroma    HISTORY OF LFT BREAST  . Sleep apnea   . Thyroid cancer ([place]) 03/19/2011   Past Surgical History:  Procedure Laterality Date  . ABDOMINAL HYSTERECTOMY  1973   partial  . BREAST BIOPSY Left 02-13-13   BENIGN BREAST TISSUE WITH CHANGES CONSISTENT WITH FAT NECROSIS  . BREAST BIOPSY Left 1995   neg  . BREAST BIOPSY Left 01/21/2015   bx done in [place] office 11:00 left 6-8cmfn  . BREAST EXCISIONAL BIOPSY Left 2011   Breast cancer radiation  . BREAST LUMPECTOMY  2011   left breast  . CHOLECYSTECTOMY    . COLONOSCOPY  2013  . MELANOMA EXCISION     RT UPPER ARM  . PARTIAL HYSTERECTOMY     bleeding, ovaries in place.    . THYROID SURGERY     FOR THYROID CANCER  . TONSILLECTOMY     Family History  Problem Relation Age of Onset  . Heart  disease Mother   . Cancer Brother     lung    History  Sexual Activity  . Sexual activity: No    Outpatient Encounter Prescriptions as of 07/01/2016  Medication Sig  . Cholecalciferol (VITAMIN D) 1000 UNITS capsule Take 1,000 Units by mouth daily.    . DULoxetine (CYMBALTA) 60 MG capsule Take 1 capsule (60 mg total) by mouth at bedtime.  . fluticasone (FLONASE) 50 MCG/ACT nasal spray Place 2 sprays into both nostrils daily.  Marland Kitchen gabapentin (NEURONTIN) 300 MG capsule TAKE 2 CAPSULES (600 MG TOTAL) BY MOUTH AT BEDTIME.  Marland Kitchen levothyroxine (SYNTHROID, LEVOTHROID) 88 MCG tablet TAKE ONE TABLET BY MOUTH ONCE DAILY BEFORE BREAKFAST  . loratadine (CLARITIN) 10 MG tablet Take 1 tablet (10 mg total) by mouth daily.  Marland Kitchen losartan-hydrochlorothiazide (HYZAAR) 100-12.5 MG tablet Take 1 tablet by mouth daily.  Marland Kitchen lovastatin (MEVACOR) 40 MG tablet Take 1 tablet (40 mg total) by mouth daily.  . Multiple Vitamins-Minerals (PRESERVISION AREDS 2 PO) Take 2 tablets by mouth daily.    Marland Kitchen omeprazole (PRILOSEC) 20 MG capsule Take 1 capsule (20 mg total)  by mouth daily.  . propranolol (INDERAL) 10 MG tablet TAKE 1 TABLET (10 MG TOTAL) BY MOUTH DAILY AS NEEDED.  Marland Kitchen warfarin (COUMADIN) 3 MG tablet Take 1 and 1/2 tablet on Wednesday and one tablet all other days of the week. (Patient taking differently: Take 1 and 1/2 (4.5mg ) tablet on Wednesday & Sunday and 1 (3mg ) tablet all other days of the week.)  . warfarin (COUMADIN) 3 MG tablet TAKE 1 TABLET (3 MG TOTAL) BY MOUTH DAILY.   No facility-administered encounter medications on file as of 07/01/2016.     Activities of Daily Living In your present state of health, do you have any difficulty performing the following activities: 07/01/2016  Hearing? N  Vision? N  Difficulty concentrating or making decisions? N  Walking or climbing stairs? Y  Dressing or bathing? N  Doing errands, shopping? N  Preparing Food and eating ? N  Using the Toilet? N  In the past six months,  have you accidently leaked urine? N  Do you have problems with loss of bowel control? N  Managing your Medications? N  Managing your Finances? N  Housekeeping or managing your Housekeeping? N  Some recent data might be hidden    Patient Care Team: Einar Pheasant, MD as PCP - General (Internal Medicine) Robert Bellow, MD (General Surgery) Rocco Serene, MD (Internal Medicine)    Assessment:    This is a routine wellness examination for [name]. The goal of the wellness visit is to assist the patient how to close the gaps in care and create a preventative care plan for the patient.   Taking calcium VIT D as appropriate/Osteoporosis risk reviewed.  Medications reviewed; taking without issues or barriers.  Safety issues reviewed; smoke detectors in the home. No firearms in the home. Wears seatbelts when driving or riding with others. No violence in the home.  No identified risk were noted; The patient was oriented x 3; appropriate in dress and manner and no objective failures at ADL's or IADL's.   Body mass index; discussed the importance of a healthy diet, water intake and exercise. Educational material provided.  TDAP and ZOSTAVAX vaccine postponed for follow up with insurance, per patient preference.  Patient Concerns: None at this time. Follow up with PCP as needed.  Exercise Activities and Dietary recommendations Current Exercise Habits: Home exercise routine, Type of exercise: walking, Frequency (Times/Week): 3, Intensity: Mild  Goals    . Increase physical activity          Currently walks 30 minutes 3 times weekly.  Increase walking up to 30 -45 minutes 5 times per week.    . Increase water intake          Stay hydrated and drink plenty of water.  Add 1 bottle (2 cups) to daily routine.      Fall Risk Fall Risk  07/01/2016 06/13/2016 07/01/2015 10/09/2014 08/16/2013  Falls in the past year? Yes No No No No  Number falls in past yr: 1 - - - -  Injury with  Fall? Yes - - - -  Risk for fall due to : (No Data) - - - -  Risk for fall due to (comments): Shoe slipped when moving furniture. - - - -  Follow up Education provided;Falls prevention discussed - - - -   Depression Screen PHQ 2/9 Scores 07/01/2016 06/13/2016 07/01/2015 10/09/2014  PHQ - 2 Score 0 0 0 0     Cognitive Testing MMSE - Mini Mental State  Exam 07/01/2016 07/01/2015  Orientation to time 5 5  Orientation to Place 5 5  Registration 3 3  Attention/ Calculation 5 5  Recall 3 3  Language- name 2 objects 2 2  Language- repeat 1 1  Language- follow 3 step command 3 3  Language- read & follow direction 1 1  Write a sentence 1 1  Copy design 1 1  Total score 30 30    Immunization History  Administered Date(s) Administered  . Influenza Split 07/18/2014  . Influenza, High Dose Seasonal PF 07/01/2015, 06/13/2016  . Influenza,inj,Quad PF,36+ Mos 06/11/2013  . Pneumococcal Conjugate-13 12/18/2013  . Pneumococcal Polysaccharide-23 07/01/2015   Screening Tests Health Maintenance  Topic Date Due  . TETANUS/TDAP  06/26/1950  . ZOSTAVAX  06/26/1993  . MAMMOGRAM  01/12/2017  . INFLUENZA VACCINE  Completed  . DEXA SCAN  Completed  . PNA vac Low Risk Adult  Completed      Plan:    End of life planning; Advance aging; Advanced directives discussed. Copy of current HCPOA/Living Will requested.  Medicare Attestation I have personally reviewed: The patient's medical and social history Their use of alcohol, tobacco or illicit drugs Their current medications and supplements The patient's functional ability including ADLs,fall risks, home safety risks, cognitive, and hearing and visual impairment Diet and physical activities Evidence for depression   The patient's weight, height, BMI, and visual acuity have been recorded in the chart.  I have made referrals and provided education to the patient based on review of the above and I have provided the patient with a written personalized  care plan for preventive services.     During the course of the visit the patient was educated and counseled about the following appropriate screening and preventive services:   Vaccines to include Pneumoccal, Influenza, Hepatitis B, Td, Zostavax, HCV  Electrocardiogram  Cardiovascular Disease  Colorectal cancer screening  Bone density screening  Diabetes screening  Glaucoma screening  Mammography/PAP  Nutrition counseling   Patient Instructions (the written plan) was given to the patient.   Varney Biles, LPN  [id number]   Reviewed above.  Agree with assessment and plan.  Dr Nicki Reaper

## 2016-07-01 NOTE — Patient Instructions (Addendum)
Kerri Mills , Thank you for taking time to come for your Medicare Wellness Visit. I appreciate your ongoing commitment to your health goals. Please review the following plan we discussed and let me know if I can assist you in the future.   FOLLOW UP WITH DR. Nicki Reaper AS NEEDED.  These are the goals we discussed: Goals    . Increase physical activity          Currently walks 30 minutes 3 times weekly.  Increase walking up to 30 -45 minutes 5 times per week.    . Increase water intake          Stay hydrated and drink plenty of water.  Add 1 bottle (2 cups) to daily routine.       This is a list of the screening recommended for you and due dates:  Health Maintenance  Topic Date Due  . Tetanus Vaccine  06/26/1950  . Shingles Vaccine  06/26/1993  . Mammogram  01/12/2017  . Flu Shot  Completed  . DEXA scan (bone density measurement)  Completed  . Pneumonia vaccines  Completed      Fall Prevention in the Home  Falls can cause injuries. They can happen to people of all ages. There are many things you can do to make your home safe and to help prevent falls.  WHAT CAN I DO ON THE OUTSIDE OF MY HOME?  Regularly fix the edges of walkways and driveways and fix any cracks.  Remove anything that might make you trip as you walk through a door, such as a raised step or threshold.  Trim any bushes or trees on the path to your home.  Use bright outdoor lighting.  Clear any walking paths of anything that might make someone trip, such as rocks or tools.  Regularly check to see if handrails are loose or broken. Make sure that both sides of any steps have handrails.  Any raised decks and porches should have guardrails on the edges.  Have any leaves, snow, or ice cleared regularly.  Use sand or salt on walking paths during winter.  Clean up any spills in your garage right away. This includes oil or grease spills. WHAT CAN I DO IN THE BATHROOM?   Use night lights.  Install grab bars by  the toilet and in the tub and shower. Do not use towel bars as grab bars.  Use non-skid mats or decals in the tub or shower.  If you need to sit down in the shower, use a plastic, non-slip stool.  Keep the floor dry. Clean up any water that spills on the floor as soon as it happens.  Remove soap buildup in the tub or shower regularly.  Attach bath mats securely with double-sided non-slip rug tape.  Do not have throw rugs and other things on the floor that can make you trip. WHAT CAN I DO IN THE BEDROOM?  Use night lights.  Make sure that you have a light by your bed that is easy to reach.  Do not use any sheets or blankets that are too big for your bed. They should not hang down onto the floor.  Have a firm chair that has side arms. You can use this for support while you get dressed.  Do not have throw rugs and other things on the floor that can make you trip. WHAT CAN I DO IN THE KITCHEN?  Clean up any spills right away.  Avoid walking on wet floors.  Keep items that you use a lot in easy-to-reach places.  If you need to reach something above you, use a strong step stool that has a grab bar.  Keep electrical cords out of the way.  Do not use floor polish or wax that makes floors slippery. If you must use wax, use non-skid floor wax.  Do not have throw rugs and other things on the floor that can make you trip. WHAT CAN I DO WITH MY STAIRS?  Do not leave any items on the stairs.  Make sure that there are handrails on both sides of the stairs and use them. Fix handrails that are broken or loose. Make sure that handrails are as long as the stairways.  Check any carpeting to make sure that it is firmly attached to the stairs. Fix any carpet that is loose or worn.  Avoid having throw rugs at the top or bottom of the stairs. If you do have throw rugs, attach them to the floor with carpet tape.  Make sure that you have a light switch at the top of the stairs and the bottom  of the stairs. If you do not have them, ask someone to add them for you. WHAT ELSE CAN I DO TO HELP PREVENT FALLS?  Wear shoes that:  Do not have high heels.  Have rubber bottoms.  Are comfortable and fit you well.  Are closed at the toe. Do not wear sandals.  If you use a stepladder:  Make sure that it is fully opened. Do not climb a closed stepladder.  Make sure that both sides of the stepladder are locked into place.  Ask someone to hold it for you, if possible.  Clearly mark and make sure that you can see:  Any grab bars or handrails.  First and last steps.  Where the edge of each step is.  Use tools that help you move around (mobility aids) if they are needed. These include:  Canes.  Walkers.  Scooters.  Crutches.  Turn on the lights when you go into a dark area. Replace any light bulbs as soon as they burn out.  Set up your furniture so you have a clear path. Avoid moving your furniture around.  If any of your floors are uneven, fix them.  If there are any pets around you, be aware of where they are.  Review your medicines with your doctor. Some medicines can make you feel dizzy. This can increase your chance of falling. Ask your doctor what other things that you can do to help prevent falls.   This information is not intended to replace advice given to you by your health care provider. Make sure you discuss any questions you have with your health care provider.   Document Released: 07/09/2009 Document Revised: 01/27/2015 Document Reviewed: 10/17/2014 Elsevier Interactive Patient Education [employer].

## 2016-07-04 ENCOUNTER — Other Ambulatory Visit: Payer: Self-pay | Admitting: Internal Medicine

## 2016-07-04 DIAGNOSIS — Z7901 Long term (current) use of anticoagulants: Secondary | ICD-10-CM

## 2016-07-04 NOTE — Progress Notes (Signed)
Order placed for f/u pt/inr 

## 2016-07-11 ENCOUNTER — Other Ambulatory Visit: Payer: Self-pay | Admitting: Internal Medicine

## 2016-07-13 ENCOUNTER — Other Ambulatory Visit: Payer: Medicare Other

## 2016-07-19 DIAGNOSIS — M48062 Spinal stenosis, lumbar region with neurogenic claudication: Secondary | ICD-10-CM | POA: Diagnosis not present

## 2016-07-19 DIAGNOSIS — M533 Sacrococcygeal disorders, not elsewhere classified: Secondary | ICD-10-CM | POA: Diagnosis not present

## 2016-07-20 ENCOUNTER — Other Ambulatory Visit: Payer: Self-pay | Admitting: Physician Assistant

## 2016-07-20 DIAGNOSIS — M533 Sacrococcygeal disorders, not elsewhere classified: Secondary | ICD-10-CM

## 2016-07-20 DIAGNOSIS — M48062 Spinal stenosis, lumbar region with neurogenic claudication: Secondary | ICD-10-CM

## 2016-07-22 ENCOUNTER — Other Ambulatory Visit (INDEPENDENT_AMBULATORY_CARE_PROVIDER_SITE_OTHER): Payer: Medicare Other

## 2016-07-22 DIAGNOSIS — Z7901 Long term (current) use of anticoagulants: Secondary | ICD-10-CM

## 2016-07-22 DIAGNOSIS — Z5181 Encounter for therapeutic drug level monitoring: Secondary | ICD-10-CM | POA: Diagnosis not present

## 2016-07-22 LAB — PROTIME-INR
INR: 2.5 ratio — ABNORMAL HIGH (ref 0.8–1.0)
PROTHROMBIN TIME: 27.2 s — AB (ref 9.6–13.1)

## 2016-07-25 ENCOUNTER — Ambulatory Visit
Admission: RE | Admit: 2016-07-25 | Discharge: 2016-07-25 | Disposition: A | Discharge status: Home / Self Care | Payer: Medicare Other | Source: Ambulatory Visit | Attending: Physician Assistant | Admitting: Physician Assistant

## 2016-07-25 ENCOUNTER — Other Ambulatory Visit: Payer: Self-pay | Admitting: Internal Medicine

## 2016-07-25 DIAGNOSIS — Z7901 Long term (current) use of anticoagulants: Secondary | ICD-10-CM

## 2016-07-25 DIAGNOSIS — M48061 Spinal stenosis, lumbar region without neurogenic claudication: Secondary | ICD-10-CM | POA: Diagnosis not present

## 2016-07-25 DIAGNOSIS — M533 Sacrococcygeal disorders, not elsewhere classified: Secondary | ICD-10-CM | POA: Diagnosis not present

## 2016-07-25 DIAGNOSIS — M48062 Spinal stenosis, lumbar region with neurogenic claudication: Secondary | ICD-10-CM | POA: Insufficient documentation

## 2016-07-25 NOTE — Progress Notes (Signed)
Order placed for f/u pt/inr 

## 2016-07-28 DIAGNOSIS — H353221 Exudative age-related macular degeneration, left eye, with active choroidal neovascularization: Secondary | ICD-10-CM | POA: Diagnosis not present

## 2016-07-28 DIAGNOSIS — Z7901 Long term (current) use of anticoagulants: Secondary | ICD-10-CM | POA: Diagnosis not present

## 2016-07-29 DIAGNOSIS — M5416 Radiculopathy, lumbar region: Secondary | ICD-10-CM | POA: Diagnosis not present

## 2016-07-29 DIAGNOSIS — M5136 Other intervertebral disc degeneration, lumbar region: Secondary | ICD-10-CM | POA: Diagnosis not present

## 2016-08-02 DIAGNOSIS — M5442 Lumbago with sciatica, left side: Secondary | ICD-10-CM | POA: Diagnosis not present

## 2016-08-02 DIAGNOSIS — G8929 Other chronic pain: Secondary | ICD-10-CM | POA: Diagnosis not present

## 2016-08-08 DIAGNOSIS — G8929 Other chronic pain: Secondary | ICD-10-CM | POA: Diagnosis not present

## 2016-08-08 DIAGNOSIS — M5442 Lumbago with sciatica, left side: Secondary | ICD-10-CM | POA: Diagnosis not present

## 2016-08-11 DIAGNOSIS — M5442 Lumbago with sciatica, left side: Secondary | ICD-10-CM | POA: Diagnosis not present

## 2016-08-11 DIAGNOSIS — G8929 Other chronic pain: Secondary | ICD-10-CM | POA: Diagnosis not present

## 2016-08-16 DIAGNOSIS — G8929 Other chronic pain: Secondary | ICD-10-CM | POA: Diagnosis not present

## 2016-08-16 DIAGNOSIS — M5442 Lumbago with sciatica, left side: Secondary | ICD-10-CM | POA: Diagnosis not present

## 2016-08-17 ENCOUNTER — Other Ambulatory Visit (INDEPENDENT_AMBULATORY_CARE_PROVIDER_SITE_OTHER): Payer: Medicare Other

## 2016-08-17 DIAGNOSIS — Z7901 Long term (current) use of anticoagulants: Secondary | ICD-10-CM

## 2016-08-17 DIAGNOSIS — Z5181 Encounter for therapeutic drug level monitoring: Secondary | ICD-10-CM | POA: Diagnosis not present

## 2016-08-17 LAB — PROTIME-INR
INR: 2.1 ratio — ABNORMAL HIGH (ref 0.8–1.0)
PROTHROMBIN TIME: 22.7 s — AB (ref 9.6–13.1)

## 2016-08-22 ENCOUNTER — Other Ambulatory Visit: Payer: Self-pay | Admitting: Internal Medicine

## 2016-08-22 DIAGNOSIS — Z7901 Long term (current) use of anticoagulants: Secondary | ICD-10-CM

## 2016-08-22 NOTE — Progress Notes (Signed)
Order placed for f/u pt/inr

## 2016-08-24 DIAGNOSIS — M5442 Lumbago with sciatica, left side: Secondary | ICD-10-CM | POA: Diagnosis not present

## 2016-08-24 DIAGNOSIS — G8929 Other chronic pain: Secondary | ICD-10-CM | POA: Diagnosis not present

## 2016-08-25 DIAGNOSIS — Z7901 Long term (current) use of anticoagulants: Secondary | ICD-10-CM | POA: Diagnosis not present

## 2016-08-26 DIAGNOSIS — M5136 Other intervertebral disc degeneration, lumbar region: Secondary | ICD-10-CM | POA: Diagnosis not present

## 2016-08-26 DIAGNOSIS — M5416 Radiculopathy, lumbar region: Secondary | ICD-10-CM | POA: Diagnosis not present

## 2016-08-31 DIAGNOSIS — M5442 Lumbago with sciatica, left side: Secondary | ICD-10-CM | POA: Diagnosis not present

## 2016-08-31 DIAGNOSIS — G8929 Other chronic pain: Secondary | ICD-10-CM | POA: Diagnosis not present

## 2016-09-01 DIAGNOSIS — H353221 Exudative age-related macular degeneration, left eye, with active choroidal neovascularization: Secondary | ICD-10-CM | POA: Diagnosis not present

## 2016-09-02 DIAGNOSIS — G8929 Other chronic pain: Secondary | ICD-10-CM | POA: Diagnosis not present

## 2016-09-02 DIAGNOSIS — M5442 Lumbago with sciatica, left side: Secondary | ICD-10-CM | POA: Diagnosis not present

## 2016-09-06 DIAGNOSIS — G8929 Other chronic pain: Secondary | ICD-10-CM | POA: Diagnosis not present

## 2016-09-06 DIAGNOSIS — M5442 Lumbago with sciatica, left side: Secondary | ICD-10-CM | POA: Diagnosis not present

## 2016-09-08 DIAGNOSIS — G8929 Other chronic pain: Secondary | ICD-10-CM | POA: Diagnosis not present

## 2016-09-08 DIAGNOSIS — M5442 Lumbago with sciatica, left side: Secondary | ICD-10-CM | POA: Diagnosis not present

## 2016-09-15 ENCOUNTER — Encounter: Payer: Self-pay | Admitting: Internal Medicine

## 2016-09-15 ENCOUNTER — Other Ambulatory Visit (INDEPENDENT_AMBULATORY_CARE_PROVIDER_SITE_OTHER): Payer: Medicare Other

## 2016-09-15 ENCOUNTER — Ambulatory Visit (INDEPENDENT_AMBULATORY_CARE_PROVIDER_SITE_OTHER): Payer: Medicare Other | Admitting: Internal Medicine

## 2016-09-15 VITALS — BP 138/84 | HR 98 | Temp 98.2°F | Ht 65.0 in | Wt 179.0 lb

## 2016-09-15 DIAGNOSIS — Z7901 Long term (current) use of anticoagulants: Secondary | ICD-10-CM

## 2016-09-15 DIAGNOSIS — C73 Malignant neoplasm of thyroid gland: Secondary | ICD-10-CM | POA: Diagnosis not present

## 2016-09-15 DIAGNOSIS — G4733 Obstructive sleep apnea (adult) (pediatric): Secondary | ICD-10-CM

## 2016-09-15 DIAGNOSIS — F439 Reaction to severe stress, unspecified: Secondary | ICD-10-CM

## 2016-09-15 DIAGNOSIS — J069 Acute upper respiratory infection, unspecified: Secondary | ICD-10-CM

## 2016-09-15 DIAGNOSIS — D0360 Melanoma in situ of unspecified upper limb, including shoulder: Secondary | ICD-10-CM

## 2016-09-15 DIAGNOSIS — R739 Hyperglycemia, unspecified: Secondary | ICD-10-CM

## 2016-09-15 DIAGNOSIS — K219 Gastro-esophageal reflux disease without esophagitis: Secondary | ICD-10-CM

## 2016-09-15 DIAGNOSIS — Z5181 Encounter for therapeutic drug level monitoring: Secondary | ICD-10-CM

## 2016-09-15 DIAGNOSIS — I4891 Unspecified atrial fibrillation: Secondary | ICD-10-CM

## 2016-09-15 DIAGNOSIS — C50919 Malignant neoplasm of unspecified site of unspecified female breast: Secondary | ICD-10-CM

## 2016-09-15 LAB — PROTIME-INR
INR: 2.8 ratio — AB (ref 0.8–1.0)
PROTHROMBIN TIME: 29.7 s — AB (ref 9.6–13.1)

## 2016-09-15 MED ORDER — PREDNISONE 10 MG PO TABS: ORAL_TABLET | ORAL | 0 refills | Status: DC

## 2016-09-15 MED ORDER — AZITHROMYCIN 250 MG PO TABS: ORAL_TABLET | ORAL | 0 refills | Status: DC

## 2016-09-15 NOTE — Progress Notes (Signed)
Patient ID: Kerri Mills, female   DOB: 30-Jan-1931, 82 y.o.   MRN: [id number]   Subjective:    Patient ID: Kerri Mills, female    DOB: 03/19/1931, 82 y.o.   MRN: [id number]  HPI  Patient here for a scheduled follow up.  She has been having issues with low back/pelvic pain and pain radiating down her left leg.  Is s/p epidural x 2.  Had physical therapy.  Is doing better.  No chest pain.  Tries to stay active.  She does report over this past week, she has developed runny nose, raw throat and change in her voice.  Some wheezing and coughing.  Increased drainage and mucus production.  Using flonase.  No nausea, vomiting or diarrhea.     Past Medical History:  Diagnosis Date  . Allergy   . Anxiety   . Arthritis   . Breast CA ([place])   . Colitis   . Depression   . Diverticulitis 2013  . Gastric ulcer   . GERD (gastroesophageal reflux disease)   . Heart disease   . Hypercholesterolemia   . Hypertension   . Infiltrating lobular carcinoma of left breast 2011   T2,N0, ER: 90%; PR 0%; Her 2 neu not amplified. [facility]).  . Melanoma ([place]) 1997  . Melanoma in situ of upper extremity ([place]) 03/19/2011  . Seroma    HISTORY OF LFT BREAST  . Sleep apnea   . Thyroid cancer ([place]) 03/19/2011   Past Surgical History:  Procedure Laterality Date  . ABDOMINAL HYSTERECTOMY  1973   partial  . BREAST BIOPSY Left 02-13-13   BENIGN BREAST TISSUE WITH CHANGES CONSISTENT WITH FAT NECROSIS  . BREAST BIOPSY Left 1995   neg  . BREAST BIOPSY Left 01/21/2015   bx done in [facility] 11:00 left 6-8cmfn  . BREAST EXCISIONAL BIOPSY Left 2011   Breast cancer radiation  . BREAST LUMPECTOMY  2011   left breast  . CHOLECYSTECTOMY    . COLONOSCOPY  2013  . MELANOMA EXCISION     RT UPPER ARM  . PARTIAL HYSTERECTOMY     bleeding, ovaries in place.    . THYROID SURGERY     FOR THYROID CANCER  . TONSILLECTOMY     Family History  Problem Relation Age of Onset  . Heart disease Mother   . Cancer  Brother     lung    Social History   Social History  . Marital status: Widowed    Spouse name: N/A  . Number of children: N/A  . Years of education: N/A   Social History Main Topics  . Smoking status: Never Smoker  . Smokeless tobacco: Never Used  . Alcohol use 0.0 oz/week     Comment: social drinking. average times a week  . Drug use: No  . Sexual activity: No   Other Topics Concern  . None   Social History Narrative  . None    Outpatient Encounter Prescriptions as of 09/15/2016  Medication Sig  . Cholecalciferol (VITAMIN D) 1000 UNITS capsule Take 1,000 Units by mouth daily.    . DULoxetine (CYMBALTA) 60 MG capsule Take 1 capsule (60 mg total) by mouth at bedtime.  . fluticasone (FLONASE) 50 MCG/ACT nasal spray Place 2 sprays into both nostrils daily.  Marland Kitchen gabapentin (NEURONTIN) 300 MG capsule TAKE 2 CAPSULES (600 MG TOTAL) BY MOUTH AT BEDTIME.  Marland Kitchen levothyroxine (SYNTHROID, LEVOTHROID) 88 MCG tablet TAKE ONE TABLET BY MOUTH ONCE DAILY BEFORE BREAKFAST  .  loratadine (CLARITIN) 10 MG tablet Take 1 tablet (10 mg total) by mouth daily.  Marland Kitchen losartan-hydrochlorothiazide (HYZAAR) 100-12.5 MG tablet Take 1 tablet by mouth daily.  Marland Kitchen lovastatin (MEVACOR) 40 MG tablet Take 1 tablet (40 mg total) by mouth daily.  . Multiple Vitamins-Minerals (PRESERVISION AREDS 2 PO) Take 2 tablets by mouth daily.    Marland Kitchen omeprazole (PRILOSEC) 20 MG capsule Take 1 capsule (20 mg total) by mouth daily.  . propranolol (INDERAL) 10 MG tablet TAKE 1 TABLET (10 MG TOTAL) BY MOUTH DAILY AS NEEDED.  Marland Kitchen warfarin (COUMADIN) 3 MG tablet Take 1 and 1/2 tablet on Wednesday and one tablet all other days of the week. (Patient taking differently: Take 1 and 1/2 (4.69m) tablet on Wednesday & Sunday and 1 (351m tablet all other days of the week.)  . warfarin (COUMADIN) 3 MG tablet TAKE 1 TABLET (3 MG TOTAL) BY MOUTH DAILY.  . Marland Kitchenzithromycin (ZITHROMAX) 250 MG tablet Take 2 tablets x 1 day and then one tablet per day for four  more days  . predniSONE (DELTASONE) 10 MG tablet Take 4 tablets x 1 day and then decrease by 1/2 tablet per day until down to zero mg.   No facility-administered encounter medications on file as of 09/15/2016.     Review of Systems  Constitutional: Negative for appetite change and unexpected weight change.  HENT: Positive for congestion and sore throat.   Respiratory: Positive for cough and wheezing. Negative for chest tightness and shortness of breath.   Cardiovascular: Negative for chest pain, palpitations and leg swelling.  Gastrointestinal: Negative for abdominal pain, diarrhea, nausea and vomiting.  Genitourinary: Negative for difficulty urinating and dysuria.  Musculoskeletal: Negative for myalgias.       Back is doing better.  Continues exercise.    Skin: Negative for color change and rash.  Neurological: Negative for dizziness, light-headedness and headaches.  Psychiatric/Behavioral: Negative for agitation and dysphoric mood.       Objective:     Blood pressure rechecked by me:  130/78  Physical Exam  Constitutional: She appears well-developed and well-nourished. No distress.  HENT:  Mouth/Throat: Oropharynx is clear and moist.  Nares - slightly erythematous turbinates.    Neck: Neck supple. No thyromegaly present.  Cardiovascular: Normal rate and regular rhythm.   Pulmonary/Chest: Breath sounds normal. No respiratory distress.  Some increased cough with expiration.    Abdominal: Soft. Bowel sounds are normal. There is no tenderness.  Musculoskeletal: She exhibits no edema or tenderness.  Lymphadenopathy:    She has no cervical adenopathy.  Skin: No rash noted. No erythema.  Psychiatric: She has a normal mood and affect. Her behavior is normal.    BP 138/84   Pulse 98   Temp 98.2 F (36.8 C) (Oral)   Ht 5' 5" (1.651 m)   Wt 179 lb (81.2 kg)   SpO2 94%   BMI 29.79 kg/m  Wt Readings from Last 3 Encounters:  09/15/16 179 lb (81.2 kg)  07/01/16 182 lb 12.8 oz  (82.9 kg)  06/13/16 184 lb 6.4 oz (83.6 kg)     Lab Results  Component Value Date   WBC 5.8 06/20/2016   HGB 12.2 06/20/2016   HCT 36.5 06/20/2016   PLT 247.0 06/20/2016   GLUCOSE 95 06/20/2016   CHOL 175 06/20/2016   TRIG 186.0 (H) 06/20/2016   HDL 47.20 06/20/2016   LDLDIRECT 92.0 12/31/2015   LDLCALC 91 06/20/2016   ALT 13 06/20/2016   AST 13 06/20/2016  NA 142 06/20/2016   K 3.8 06/20/2016   CL 103 06/20/2016   CREATININE 0.84 06/20/2016   BUN 12 06/20/2016   CO2 35 (H) 06/20/2016   TSH 2.73 12/31/2015   INR 2.8 (H) 09/15/2016   HGBA1C 6.4 06/20/2016    Mr Lumbar Spine Wo Contrast  Result Date: 07/25/2016 CLINICAL DATA:  Sacroiliac joint dysfunction. Spinal stenosis with neurogenic claudication EXAM: MRI LUMBAR SPINE WITHOUT CONTRAST TECHNIQUE: Multiplanar, multisequence MR imaging of the lumbar spine was performed. No intravenous contrast was administered. COMPARISON:  03/14/2006 FINDINGS: Segmentation:  Standard. Alignment:  Slight L4-5 anterolisthesis. Vertebrae: Degenerative appearing marrow edematous signal around the T12-L1 anterior disc. No evidence of acute fracture, discitis, or bone lesion. Conus medullaris: Extends to the L1 level and appears normal. Paraspinal and other soft tissues: Small right renal cysts. Sigmoid diverticulosis. Disc levels: T12- L1: Spondylosis and mild disc narrowing/ bulging. No impingement L1-L2: Spondylosis and mild disc narrowing/ bulging. Mild facet hypertrophy. No impingement L2-L3: Spondylotic spurring.  No impingement L3-L4: Disc narrowing and bulging with hyper trophic facet arthropathy. Mild right subarticular recess narrowing. Patent foramina L4-L5: Disc narrowing and bulging. Facet arthropathy with slight anterolisthesis. Left more than right subarticular recess narrowing without static compression. Patent foramina L5-S1: Intervertebral ankylosis has developed from prior across the narrowed disc. Negative facets. No impingement  IMPRESSION: 1. Degenerative changes without significant progression since 2007. 2. L3-4 mild right subarticular recess stenosis. 3. L4-5 left more than right subarticular recess stenosis without static compression. 4. L4-5 predominant facet arthropathy with slight anterolisthesis. Electronically Signed   By: Monte Fantasia M.D.   On: 07/25/2016 09:17       Assessment & Plan:   Problem List Items Addressed This Visit    Atrial fibrillation ([place])    On coumadin.  Follow pt/inr.        GERD (gastroesophageal reflux disease)    Controlled on omeprazole.       Hyperglycemia    Low carb diet and exercise.  Follow met b and a1c.       Infiltrating lobular carcinoma of left breast    Seeing Dr Bary Castilla.  Note reviewed.  Had mammogram in 12/2015.  Recommended f/u mri.  See note.  Decided to follow with f/u mammogram in one year.        Relevant Medications   predniSONE (DELTASONE) 10 MG tablet   azithromycin (ZITHROMAX) 250 MG tablet   Melanoma in situ of upper extremity ([place])    Followed by dermatology.       Obstructive sleep apnea    CPAP.       Stress    Stable on cymbalta.        Thyroid cancer ([place])    On thyroid replacement.  Follow tsh.       Relevant Medications   predniSONE (DELTASONE) 10 MG tablet   azithromycin (ZITHROMAX) 250 MG tablet    Other Visit Diagnoses    Long term current use of anticoagulant therapy    -  Primary   Relevant Orders   INR/PT (Completed)   Upper respiratory tract infection, unspecified type       symptoms as outlined.  nasacort/saline nasal spray as directed.  mucinex and prednisone taper as directed.  if symptoms persist, zpak as directed.  follow.    Relevant Medications   azithromycin (ZITHROMAX) 250 MG tablet       Einar Pheasant, MD

## 2016-09-15 NOTE — Patient Instructions (Signed)
Saline nasal spray - flush nose at least 2-3x/day  flonase nasal spray - 2 sprays each nostril one time per day.    mucinex in the am and robitussin in the evening.

## 2016-09-20 DIAGNOSIS — G8929 Other chronic pain: Secondary | ICD-10-CM | POA: Diagnosis not present

## 2016-09-20 DIAGNOSIS — M5442 Lumbago with sciatica, left side: Secondary | ICD-10-CM | POA: Diagnosis not present

## 2016-09-21 ENCOUNTER — Encounter: Payer: Self-pay | Admitting: Internal Medicine

## 2016-09-21 NOTE — Assessment & Plan Note (Signed)
Low carb diet and exercise.  Follow met b and a1c.  

## 2016-09-21 NOTE — Assessment & Plan Note (Signed)
CPAP.  

## 2016-09-21 NOTE — Assessment & Plan Note (Signed)
Controlled on omeprazole.   

## 2016-09-21 NOTE — Assessment & Plan Note (Signed)
Seeing Dr Bary Castilla.  Note reviewed.  Had mammogram in 12/2015.  Recommended f/u mri.  See note.  Decided to follow with f/u mammogram in one year.

## 2016-09-21 NOTE — Assessment & Plan Note (Signed)
Followed by dermatology

## 2016-09-21 NOTE — Assessment & Plan Note (Signed)
Stable on cymbalta.  

## 2016-09-21 NOTE — Assessment & Plan Note (Signed)
On coumadin.  Follow pt/inr.  

## 2016-09-21 NOTE — Assessment & Plan Note (Signed)
On thyroid replacement.  Follow tsh.  

## 2016-09-22 DIAGNOSIS — G8929 Other chronic pain: Secondary | ICD-10-CM | POA: Diagnosis not present

## 2016-09-22 DIAGNOSIS — M5442 Lumbago with sciatica, left side: Secondary | ICD-10-CM | POA: Diagnosis not present

## 2016-09-28 ENCOUNTER — Telehealth: Payer: Self-pay | Admitting: Radiology

## 2016-09-28 NOTE — Telephone Encounter (Signed)
Pt coming in for labs tomorrow, please place orders.

## 2016-09-29 ENCOUNTER — Other Ambulatory Visit: Payer: Medicare Other

## 2016-09-29 ENCOUNTER — Other Ambulatory Visit: Payer: Self-pay | Admitting: Internal Medicine

## 2016-09-29 DIAGNOSIS — Z7901 Long term (current) use of anticoagulants: Secondary | ICD-10-CM

## 2016-09-29 NOTE — Progress Notes (Signed)
Order placed for pt/inr 

## 2016-09-29 NOTE — Telephone Encounter (Signed)
Order placed for pt/inr 

## 2016-10-07 ENCOUNTER — Other Ambulatory Visit: Payer: Self-pay | Admitting: Internal Medicine

## 2016-10-17 ENCOUNTER — Other Ambulatory Visit (INDEPENDENT_AMBULATORY_CARE_PROVIDER_SITE_OTHER): Payer: Medicare Other

## 2016-10-17 DIAGNOSIS — Z7901 Long term (current) use of anticoagulants: Secondary | ICD-10-CM

## 2016-10-17 DIAGNOSIS — Z5181 Encounter for therapeutic drug level monitoring: Secondary | ICD-10-CM

## 2016-10-17 LAB — PROTIME-INR
INR: 2.7 ratio — AB (ref 0.8–1.0)
Prothrombin Time: 29.6 s — ABNORMAL HIGH (ref 9.6–13.1)

## 2016-10-18 ENCOUNTER — Telehealth: Payer: Self-pay | Admitting: *Deleted

## 2016-10-18 DIAGNOSIS — Z7901 Long term (current) use of anticoagulants: Secondary | ICD-10-CM

## 2016-10-18 NOTE — Telephone Encounter (Signed)
Placed standing order for PT/INR 

## 2016-11-08 ENCOUNTER — Telehealth: Payer: Self-pay | Admitting: Internal Medicine

## 2016-11-08 NOTE — Telephone Encounter (Signed)
Pt called and left a voicemail stating that she needs a Tdap shot and needs more information on it. Please advise, thank you!  Call pt @ [phone]

## 2016-11-08 NOTE — Telephone Encounter (Signed)
Called pt n/a no v/m

## 2016-11-08 NOTE — Telephone Encounter (Signed)
Pt wanted to know what side effects of injection went over with her she will call if any further questions.

## 2016-11-10 ENCOUNTER — Other Ambulatory Visit: Payer: Self-pay

## 2016-11-10 DIAGNOSIS — D0502 Lobular carcinoma in situ of left breast: Secondary | ICD-10-CM

## 2016-11-15 ENCOUNTER — Other Ambulatory Visit (INDEPENDENT_AMBULATORY_CARE_PROVIDER_SITE_OTHER): Payer: Medicare Other

## 2016-11-15 ENCOUNTER — Telehealth: Payer: Self-pay

## 2016-11-15 ENCOUNTER — Ambulatory Visit: Payer: Medicare Other

## 2016-11-15 DIAGNOSIS — Z7901 Long term (current) use of anticoagulants: Secondary | ICD-10-CM

## 2016-11-15 LAB — PROTIME-INR
INR: 2.8 ratio — ABNORMAL HIGH (ref 0.8–1.0)
Prothrombin Time: 29.8 s — ABNORMAL HIGH (ref 9.6–13.1)

## 2016-11-15 MED ORDER — TETANUS-DIPHTH-ACELL PERTUSSIS 5-2-15.5 LF-MCG/0.5 IM SUSP: 0.5000 mL | Freq: Once | INTRAMUSCULAR | 0 refills | Status: AC

## 2016-11-15 MED ORDER — DTAP-IPV VACCINE IM SUSP
0.5000 mL | Freq: Once | INTRAMUSCULAR | 0 refills | Status: DC
Start: 2016-11-15 — End: 2016-11-15

## 2016-11-15 NOTE — Telephone Encounter (Deleted)
Patient states she is having indigestion tried using baking soda no help .  She states burning happens during the day after eating and at bedtime tried other counter Prilosec wonders if will call in prescription.   Patient states can't afford to buy over the counter Please advise.

## 2016-11-18 NOTE — Telephone Encounter (Signed)
Error

## 2016-11-21 ENCOUNTER — Other Ambulatory Visit: Payer: Self-pay | Admitting: Internal Medicine

## 2016-12-10 ENCOUNTER — Other Ambulatory Visit: Payer: Self-pay | Admitting: Internal Medicine

## 2016-12-14 ENCOUNTER — Other Ambulatory Visit: Payer: Medicare Other

## 2016-12-19 ENCOUNTER — Other Ambulatory Visit (INDEPENDENT_AMBULATORY_CARE_PROVIDER_SITE_OTHER): Payer: Medicare Other

## 2016-12-19 DIAGNOSIS — Z7901 Long term (current) use of anticoagulants: Secondary | ICD-10-CM

## 2016-12-19 LAB — PROTIME-INR
INR: 2.6 ratio — ABNORMAL HIGH (ref 0.8–1.0)
PROTHROMBIN TIME: 27.6 s — AB (ref 9.6–13.1)

## 2016-12-28 ENCOUNTER — Telehealth: Payer: Self-pay | Admitting: *Deleted

## 2016-12-28 DIAGNOSIS — M79671 Pain in right foot: Secondary | ICD-10-CM

## 2016-12-28 NOTE — Telephone Encounter (Signed)
Pt has requested to see podiatry, she has had trouble with her right foot for three weeks.  Pt contact [phone]

## 2016-12-28 NOTE — Telephone Encounter (Signed)
Does she have a preference of which podiatrist she sees and I assume is for right foot pain.  I will place the order, just need to know if has preference of who sees.

## 2016-12-29 NOTE — Telephone Encounter (Signed)
Patient states she does not have a podiatrist she would like to see someone in [place]. She states it is for right foot.

## 2016-12-29 NOTE — Telephone Encounter (Signed)
Order placed for podiatry referral.   

## 2017-01-01 ENCOUNTER — Other Ambulatory Visit: Payer: Self-pay | Admitting: Internal Medicine

## 2017-01-02 ENCOUNTER — Encounter: Payer: Self-pay | Admitting: Family

## 2017-01-02 ENCOUNTER — Ambulatory Visit (INDEPENDENT_AMBULATORY_CARE_PROVIDER_SITE_OTHER): Payer: Medicare Other | Admitting: Family

## 2017-01-02 VITALS — BP 128/86 | HR 88 | Temp 98.5°F | Resp 16 | Wt 186.4 lb

## 2017-01-02 DIAGNOSIS — J40 Bronchitis, not specified as acute or chronic: Secondary | ICD-10-CM

## 2017-01-02 DIAGNOSIS — R6889 Other general symptoms and signs: Secondary | ICD-10-CM | POA: Insufficient documentation

## 2017-01-02 LAB — POC INFLUENZA A&B (BINAX/QUICKVUE)
INFLUENZA A, POC: NEGATIVE
INFLUENZA B, POC: NEGATIVE

## 2017-01-02 MED ORDER — ALBUTEROL SULFATE HFA 108 (90 BASE) MCG/ACT IN AERS: 2.0000 | INHALATION_SPRAY | Freq: Four times a day (QID) | RESPIRATORY_TRACT | 1 refills | Status: DC | PRN

## 2017-01-02 MED ORDER — BENZONATATE 100 MG PO CAPS: 100.0000 mg | ORAL_CAPSULE | Freq: Two times a day (BID) | ORAL | 0 refills | Status: DC | PRN

## 2017-01-02 NOTE — Patient Instructions (Signed)
Trial of albuterol inhaler  Use albuterol every 6 hours for first 24 hours to get good medication into the lungs and loosen congestion; after, you may use as needed and eventually stop all together when cough resolves.  Cough medication  Suspect viral  Let us know if not better.

## 2017-01-02 NOTE — Progress Notes (Signed)
Subjective:    Patient ID: Kerri Mills, female    DOB: 1931-07-17, 83 y.o.   MRN: [id number]  CC: [name] is a 83 y.o. female who presents today for an acute visit.    HPI: CC: productive cough and chills x 3 days, unchanged.  Endorses post nasal drip, cough, fatigue, sore Throat, wheezing. Wheezing is normal for in the mornings for several months.  Has never had inhaler. Tried tyleonol with relief.   Notices a 'little SOB' when walking dog occasionally. No CP, palpitations, left arm pain.   NO smoking history.   Would like to be tested for flu; no known exposure.        HISTORY:  Past Medical History:  Diagnosis Date  . Allergy   . Anxiety   . Arthritis   . Breast CA ([place])   . Colitis   . Depression   . Diverticulitis 2013  . Gastric ulcer   . GERD (gastroesophageal reflux disease)   . Heart disease   . Hypercholesterolemia   . Hypertension   . Infiltrating lobular carcinoma of left breast 2011   T2,N0, ER: 90%; PR 0%; Her 2 neu not amplified. [facility]).  . Melanoma ([place]) 1997  . Melanoma in situ of upper extremity ([place]) 03/19/2011  . Seroma    HISTORY OF LFT BREAST  . Sleep apnea   . Thyroid cancer ([place]) 03/19/2011   Past Surgical History:  Procedure Laterality Date  . ABDOMINAL HYSTERECTOMY  1973   partial  . BREAST BIOPSY Left 02-13-13   BENIGN BREAST TISSUE WITH CHANGES CONSISTENT WITH FAT NECROSIS  . BREAST BIOPSY Left 1995   neg  . BREAST BIOPSY Left 01/21/2015   bx done in [facility] 11:00 left 6-8cmfn  . BREAST EXCISIONAL BIOPSY Left 2011   Breast cancer radiation  . BREAST LUMPECTOMY  2011   left breast  . CHOLECYSTECTOMY    . COLONOSCOPY  2013  . MELANOMA EXCISION     RT UPPER ARM  . PARTIAL HYSTERECTOMY     bleeding, ovaries in place.    . THYROID SURGERY     FOR THYROID CANCER  . TONSILLECTOMY     Family History  Problem Relation Age of Onset  . Heart disease Mother   . Cancer Brother     lung     Allergies:  Atorvastatin; Lipitor [atorvastatin calcium]; and Penicillins Current Outpatient Prescriptions on File Prior to Visit  Medication Sig Dispense Refill  . azithromycin (ZITHROMAX) 250 MG tablet Take 2 tablets x 1 day and then one tablet per day for four more days 6 tablet 0  . Cholecalciferol (VITAMIN D) 1000 UNITS capsule Take 1,000 Units by mouth daily.      . DULoxetine (CYMBALTA) 60 MG capsule TAKE 1 CAPSULE (60 MG TOTAL) BY MOUTH AT BEDTIME. 90 capsule 1  . fluticasone (FLONASE) 50 MCG/ACT nasal spray Place 2 sprays into both nostrils daily. 16 g 6  . gabapentin (NEURONTIN) 300 MG capsule TAKE 2 CAPSULES (600 MG TOTAL) BY MOUTH AT BEDTIME. 180 capsule 1  . levothyroxine (SYNTHROID, LEVOTHROID) 88 MCG tablet TAKE ONE TABLET BY MOUTH ONCE DAILY BEFORE BREAKFAST 90 tablet 3  . loratadine (CLARITIN) 10 MG tablet Take 1 tablet (10 mg total) by mouth daily. 30 tablet 11  . losartan-hydrochlorothiazide (HYZAAR) 100-12.5 MG tablet TAKE 1 TABLET BY MOUTH DAILY. 90 tablet 2  . lovastatin (MEVACOR) 40 MG tablet TAKE 1 TABLET (40 MG TOTAL) BY MOUTH DAILY.  90 tablet 2  . Multiple Vitamins-Minerals (PRESERVISION AREDS 2 PO) Take 2 tablets by mouth daily.      Marland Kitchen omeprazole (PRILOSEC) 20 MG capsule TAKE 1 CAPSULE (20 MG TOTAL) BY MOUTH DAILY. 90 capsule 3  . propranolol (INDERAL) 10 MG tablet TAKE 1 TABLET (10 MG TOTAL) BY MOUTH DAILY AS NEEDED. 90 tablet 0  . warfarin (COUMADIN) 3 MG tablet Take 1 and 1/2 tablet on Wednesday and one tablet all other days of the week. (Patient taking differently: Take 1 and 1/2 (4.5mg ) tablet on Wednesday & Sunday and 1 (3mg ) tablet all other days of the week.) 100 tablet 1  . warfarin (COUMADIN) 3 MG tablet TAKE 1 TABLET (3 MG TOTAL) BY MOUTH DAILY. 90 tablet 1  . predniSONE (DELTASONE) 10 MG tablet Take 4 tablets x 1 day and then decrease by 1/2 tablet per day until down to zero mg. (Patient not taking: Reported on 01/02/2017) 17 tablet 0   No current facility-administered  medications on file prior to visit.     Social History  Substance Use Topics  . Smoking status: Never Smoker  . Smokeless tobacco: Never Used  . Alcohol use 0.0 oz/week     Comment: social drinking. average times a week    Review of Systems  Constitutional: Negative for chills and fever.  HENT: Positive for congestion and sore throat.   Respiratory: Positive for cough, shortness of breath and wheezing.   Cardiovascular: Negative for chest pain and palpitations.  Gastrointestinal: Negative for nausea and vomiting.      Objective:    BP 128/86 (BP Location: Left Arm, Patient Position: Sitting, Cuff Size: Normal)   Pulse 88   Temp 98.5 F (36.9 C) (Oral)   Resp 16   Wt 186 lb 6 oz (84.5 kg)   SpO2 97%   BMI 31.01 kg/m    Physical Exam  Constitutional: She appears well-developed and well-nourished.  HENT:  Head: Normocephalic and atraumatic.  Right Ear: Hearing, tympanic membrane, external ear and ear canal normal. No drainage, swelling or tenderness. No foreign bodies. Tympanic membrane is not erythematous and not bulging. No middle ear effusion. No decreased hearing is noted.  Left Ear: Hearing, tympanic membrane, external ear and ear canal normal. No drainage, swelling or tenderness. No foreign bodies. Tympanic membrane is not erythematous and not bulging.  No middle ear effusion. No decreased hearing is noted.  Nose: Nose normal. No rhinorrhea. Right sinus exhibits no maxillary sinus tenderness and no frontal sinus tenderness. Left sinus exhibits no maxillary sinus tenderness and no frontal sinus tenderness.  Mouth/Throat: Uvula is midline, oropharynx is clear and moist and mucous membranes are normal. No oropharyngeal exudate, posterior oropharyngeal edema, posterior oropharyngeal erythema or tonsillar abscesses.  Eyes: Conjunctivae are normal.  Cardiovascular: Regular rhythm, normal heart sounds and normal pulses.   Pulmonary/Chest: Effort normal. She has decreased breath  sounds in the right lower field and the left lower field. She has no wheezes. She has no rhonchi. She has no rales.  Lymphadenopathy:       Head (right side): No submental, no submandibular, no tonsillar, no preauricular, no posterior auricular and no occipital adenopathy present.       Head (left side): No submental, no submandibular, no tonsillar, no preauricular, no posterior auricular and no occipital adenopathy present.    She has no cervical adenopathy.  Neurological: She is alert.  Skin: Skin is warm and dry.  Psychiatric: She has a normal mood and affect. Her speech  is normal and behavior is normal. Thought content normal.  Vitals reviewed.  Patient felt significantly better after albuterol treatment. Lung sounds clear and increased     Assessment & Plan:   1. Bronchitis Symptoms for 3 days. Flu negative. Improved with nebulizer. No acute respiratory distress and well appearing. We jointly decided likely viral etiology and would treat conservatively at this time. Return precautions given.   - POC Influenza A&B(BINAX/QUICKVUE) - albuterol (PROVENTIL HFA) 108 (90 Base) MCG/ACT inhaler; Inhale 2 puffs into the lungs every 6 (six) hours as needed for wheezing or shortness of breath.  Dispense: 1 Inhaler; Refill: 1 - benzonatate (TESSALON) 100 MG capsule; Take 1 capsule (100 mg total) by mouth 2 (two) times daily as needed for cough.  Dispense: 20 capsule; Refill: 0    I am having Ms. Chisenhall maintain her Multiple Vitamins-Minerals (PRESERVISION AREDS 2 PO), Vitamin D, levothyroxine, warfarin, fluticasone, loratadine, predniSONE, azithromycin, losartan-hydrochlorothiazide, lovastatin, propranolol, warfarin, omeprazole, gabapentin, and DULoxetine.   No orders of the defined types were placed in this encounter.   Return precautions given.   Risks, benefits, and alternatives of the medications and treatment plan prescribed today were discussed, and patient expressed understanding.    Education regarding symptom management and diagnosis given to patient on AVS.  Continue to follow with Einar Pheasant, MD for routine health maintenance.   [name] and I agreed with plan.   [name], FNP

## 2017-01-04 ENCOUNTER — Telehealth: Payer: Self-pay

## 2017-01-04 NOTE — Telephone Encounter (Signed)
They should have preferred alternatives.  (I.e., albuterol, pro air).  Need list of their preferred meds.

## 2017-01-04 NOTE — Telephone Encounter (Signed)
Proventil has been rejected. Is their something that it can be replaced with?

## 2017-01-05 ENCOUNTER — Other Ambulatory Visit: Payer: Self-pay | Admitting: Internal Medicine

## 2017-01-10 MED ORDER — ALBUTEROL SULFATE HFA 108 (90 BASE) MCG/ACT IN AERS: 2.0000 | INHALATION_SPRAY | Freq: Four times a day (QID) | RESPIRATORY_TRACT | 3 refills | Status: DC | PRN

## 2017-01-10 NOTE — Telephone Encounter (Signed)
Spoke to pharmacy it will cover Ventolin or Pro air

## 2017-01-10 NOTE — Telephone Encounter (Signed)
I have changed the rx to proair.  rx sent in.

## 2017-01-13 ENCOUNTER — Ambulatory Visit
Admission: RE | Admit: 2017-01-13 | Discharge: 2017-01-13 | Disposition: A | Discharge status: Home / Self Care | Payer: Medicare Other | Source: Ambulatory Visit | Attending: General Surgery | Admitting: General Surgery

## 2017-01-13 DIAGNOSIS — D0502 Lobular carcinoma in situ of left breast: Secondary | ICD-10-CM

## 2017-01-13 DIAGNOSIS — Z9889 Other specified postprocedural states: Secondary | ICD-10-CM | POA: Insufficient documentation

## 2017-01-13 HISTORY — DX: Personal history of irradiation: Z92.3

## 2017-01-16 ENCOUNTER — Telehealth: Payer: Self-pay | Admitting: Internal Medicine

## 2017-01-16 NOTE — Telephone Encounter (Signed)
Reason for call: cough  Symptoms: cough green//gray phlegm ,  Some shortness of breath when coughing, wheezing some , still feels congested , no fever, some chills  Duration 01/02/17  Medications: benzonatate/ pro-air using tid Last seen for this problem: 01/02/17 Seen by: Kerrie Pleasure  Spoke with Dr Nicki Reaper she agreed to work in at 1000 am for appointment . Per verbal orders from Dr Nicki Reaper.    Caryl Pina will you please place on schedule ?  Thanks.

## 2017-01-16 NOTE — Telephone Encounter (Signed)
Pt called with a concern wanting to know if this is normal to be still coughing up phelm and a terrible cough? Please advise?  Call pt @ [phone] or cell [phone]. Thank you!

## 2017-01-16 NOTE — Telephone Encounter (Signed)
Pt scheduled  

## 2017-01-16 NOTE — Telephone Encounter (Signed)
Pt called back looking for an update. Please advise, thank you! °

## 2017-01-17 ENCOUNTER — Other Ambulatory Visit: Payer: Medicare Other

## 2017-01-17 ENCOUNTER — Ambulatory Visit (INDEPENDENT_AMBULATORY_CARE_PROVIDER_SITE_OTHER): Payer: Medicare Other | Admitting: Internal Medicine

## 2017-01-17 ENCOUNTER — Ambulatory Visit (INDEPENDENT_AMBULATORY_CARE_PROVIDER_SITE_OTHER): Payer: Medicare Other

## 2017-01-17 ENCOUNTER — Encounter: Payer: Self-pay | Admitting: Internal Medicine

## 2017-01-17 VITALS — BP 140/82 | HR 83 | Temp 98.6°F | Resp 12 | Wt 186.2 lb

## 2017-01-17 DIAGNOSIS — R062 Wheezing: Secondary | ICD-10-CM | POA: Diagnosis not present

## 2017-01-17 DIAGNOSIS — K219 Gastro-esophageal reflux disease without esophagitis: Secondary | ICD-10-CM | POA: Diagnosis not present

## 2017-01-17 DIAGNOSIS — R05 Cough: Secondary | ICD-10-CM

## 2017-01-17 DIAGNOSIS — I4891 Unspecified atrial fibrillation: Secondary | ICD-10-CM

## 2017-01-17 DIAGNOSIS — Z5181 Encounter for therapeutic drug level monitoring: Secondary | ICD-10-CM | POA: Diagnosis not present

## 2017-01-17 DIAGNOSIS — R059 Cough, unspecified: Secondary | ICD-10-CM

## 2017-01-17 DIAGNOSIS — Z7901 Long term (current) use of anticoagulants: Secondary | ICD-10-CM

## 2017-01-17 LAB — PROTIME-INR
INR: 2.8 ratio — ABNORMAL HIGH (ref 0.8–1.0)
PROTHROMBIN TIME: 30.1 s — AB (ref 9.6–13.1)

## 2017-01-17 MED ORDER — PREDNISONE 10 MG PO TABS
ORAL_TABLET | ORAL | 0 refills | Status: DC
Start: 2017-01-17 — End: 2017-05-23

## 2017-01-17 MED ORDER — AZITHROMYCIN 250 MG PO TABS: ORAL_TABLET | ORAL | 0 refills | Status: DC

## 2017-01-17 NOTE — Patient Instructions (Addendum)
Saline nasal spray - flush nose at lease 2-3 x/day  Continue flonase in the evening  Take a probiotic daily while you are on the antibiotic and for two weeks after completing the antibiotics.    Robitussin DM twice a day as needed.

## 2017-01-17 NOTE — Progress Notes (Signed)
Patient ID: Kerri Mills, female   DOB: 15-Apr-1931, 83 y.o.   MRN: [id number]   Subjective:    Patient ID: Kerri Mills, female    DOB: [birth year], 83 y.o.   MRN: [id number]  HPI  Patient here as a work in with concerns regarding persistent cough.  She was seen on 01/02/17 by [name].  Note reviewed.  Was given tessalon perles and albuterol inhaler.  She reports still with persistent coughing fits.  Tired of coughing.  Productive green mucus.  No sinus pressure.  Some drainage.  No acid reflux.  No abdominal pain.  Bowels moving.  Eating.    Past Medical History:  Diagnosis Date  . Allergy   . Anxiety   . Arthritis   . Breast CA ([place])   . Breast cancer ([place]) 2011   LT LUMPECTOMY  . Colitis   . Depression   . Diverticulitis 2013  . Gastric ulcer   . GERD (gastroesophageal reflux disease)   . Heart disease   . Hypercholesterolemia   . Hypertension   . Infiltrating lobular carcinoma of left breast 2011   T2,N0, ER: 90%; PR 0%; Her 2 neu not amplified. [facility]).  . Melanoma ([place]) 1997  . Melanoma in situ of upper extremity ([place]) 03/19/2011  . Personal history of radiation therapy 2011   BREAST CA  . Seroma    HISTORY OF LFT BREAST  . Sleep apnea   . Thyroid cancer ([place]) 03/19/2011   Past Surgical History:  Procedure Laterality Date  . ABDOMINAL HYSTERECTOMY  1973   partial  . BREAST BIOPSY Left 02-13-13   BENIGN BREAST TISSUE WITH CHANGES CONSISTENT WITH FAT NECROSIS  . BREAST BIOPSY Left 01/21/2015   bx done in [facility] 11:00 left 6-8cmfn  . BREAST EXCISIONAL BIOPSY Left 1995   neg  . BREAST EXCISIONAL BIOPSY Left 2011   Breast cancer radiation  . BREAST LUMPECTOMY Left 2011   BREAST CA  . CHOLECYSTECTOMY    . COLONOSCOPY  2013  . MELANOMA EXCISION     RT UPPER ARM  . PARTIAL HYSTERECTOMY     bleeding, ovaries in place.    . THYROID SURGERY     FOR THYROID CANCER  . TONSILLECTOMY     Family History  Problem Relation Age of Onset  . Heart  disease Mother   . Cancer Brother     lung   . Breast cancer Neg Hx    Social History   Social History  . Marital status: Widowed    Spouse name: N/A  . Number of children: N/A  . Years of education: N/A   Social History Main Topics  . Smoking status: Never Smoker  . Smokeless tobacco: Never Used  . Alcohol use 0.0 oz/week     Comment: social drinking. average times a week  . Drug use: No  . Sexual activity: No   Other Topics Concern  . None   Social History Narrative  . None    Outpatient Encounter Prescriptions as of 01/17/2017  Medication Sig  . albuterol (PROAIR HFA) 108 (90 Base) MCG/ACT inhaler Inhale 2 puffs into the lungs every 6 (six) hours as needed for wheezing or shortness of breath.  . Cholecalciferol (VITAMIN D) 1000 UNITS capsule Take 1,000 Units by mouth daily.    . DULoxetine (CYMBALTA) 60 MG capsule TAKE 1 CAPSULE (60 MG TOTAL) BY MOUTH AT BEDTIME.  . fluticasone (FLONASE) 50 MCG/ACT nasal spray Place 2 sprays into  both nostrils daily.  Marland Kitchen gabapentin (NEURONTIN) 300 MG capsule TAKE 2 CAPSULES (600 MG TOTAL) BY MOUTH AT BEDTIME.  Marland Kitchen levothyroxine (SYNTHROID, LEVOTHROID) 88 MCG tablet TAKE ONE TABLET BY MOUTH ONCE DAILY BEFORE BREAKFAST  . losartan-hydrochlorothiazide (HYZAAR) 100-12.5 MG tablet TAKE 1 TABLET BY MOUTH DAILY.  Marland Kitchen lovastatin (MEVACOR) 40 MG tablet TAKE 1 TABLET (40 MG TOTAL) BY MOUTH DAILY.  . Multiple Vitamins-Minerals (PRESERVISION AREDS 2 PO) Take 2 tablets by mouth daily.    Marland Kitchen omeprazole (PRILOSEC) 20 MG capsule TAKE 1 CAPSULE (20 MG TOTAL) BY MOUTH DAILY.  Marland Kitchen propranolol (INDERAL) 10 MG tablet TAKE 1 TABLET (10 MG TOTAL) BY MOUTH DAILY AS NEEDED.  Marland Kitchen warfarin (COUMADIN) 3 MG tablet Take 1 and 1/2 tablet on Wednesday and one tablet all other days of the week. (Patient taking differently: Take 1 and 1/2 (4.5mg ) tablet on Wednesday & Sunday and 1 (3mg ) tablet all other days of the week.)  . azithromycin (ZITHROMAX) 250 MG tablet Take 2 tablets x 1  day and then one tablet per day for four more days  . predniSONE (DELTASONE) 10 MG tablet Take 6 tablets x 1 day and then decrease by 1/2 tablet per day until down to zero mg.  . [DISCONTINUED] azithromycin (ZITHROMAX) 250 MG tablet Take 2 tablets x 1 day and then one tablet per day for four more days  . [DISCONTINUED] benzonatate (TESSALON) 100 MG capsule Take 1 capsule (100 mg total) by mouth 2 (two) times daily as needed for cough.  . [DISCONTINUED] loratadine (CLARITIN) 10 MG tablet Take 1 tablet (10 mg total) by mouth daily.  . [DISCONTINUED] predniSONE (DELTASONE) 10 MG tablet Take 4 tablets x 1 day and then decrease by 1/2 tablet per day until down to zero mg. (Patient not taking: Reported on 01/02/2017)  . [DISCONTINUED] warfarin (COUMADIN) 3 MG tablet TAKE 1 TABLET (3 MG TOTAL) BY MOUTH DAILY.   No facility-administered encounter medications on file as of 01/17/2017.     Review of Systems  Constitutional: Negative for appetite change and unexpected weight change.  HENT: Positive for congestion and postnasal drip. Negative for sinus pressure.   Respiratory: Positive for cough. Negative for chest tightness and shortness of breath.   Cardiovascular: Negative for chest pain, palpitations and leg swelling.  Gastrointestinal: Negative for abdominal pain, diarrhea, nausea and vomiting.  Genitourinary: Negative for difficulty urinating and dysuria.  Musculoskeletal: Negative for back pain and joint swelling.  Skin: Negative for color change and rash.  Neurological: Negative for dizziness, light-headedness and headaches.  Psychiatric/Behavioral: Negative for agitation and dysphoric mood.       Objective:    Physical Exam  Constitutional: She appears well-developed and well-nourished. No distress.  HENT:  Mouth/Throat: Oropharynx is clear and moist.  Nares - slightly erythematous turbinates.  No tenderness to palpation over the sinuses.    Neck: Neck supple.  Cardiovascular: Normal rate  and regular rhythm.   Pulmonary/Chest: Breath sounds normal. No respiratory distress.  Increased cough with forced expiration.    Abdominal: Soft. Bowel sounds are normal. There is no tenderness.  Musculoskeletal: She exhibits no edema or tenderness.  Lymphadenopathy:    She has no cervical adenopathy.  Skin: No rash noted. No erythema.  Psychiatric: She has a normal mood and affect. Her behavior is normal.    BP 140/82 (BP Location: Left Arm, Patient Position: Sitting, Cuff Size: Normal)   Pulse 83   Temp 98.6 F (37 C) (Oral)   Resp 12  Wt 186 lb 3.2 oz (84.5 kg)   SpO2 95%   BMI 30.99 kg/m  Wt Readings from Last 3 Encounters:  01/17/17 186 lb 3.2 oz (84.5 kg)  01/02/17 186 lb 6 oz (84.5 kg)  09/15/16 179 lb (81.2 kg)     Lab Results  Component Value Date   WBC 5.8 06/20/2016   HGB 12.2 06/20/2016   HCT 36.5 06/20/2016   PLT 247.0 06/20/2016   GLUCOSE 95 06/20/2016   CHOL 175 06/20/2016   TRIG 186.0 (H) 06/20/2016   HDL 47.20 06/20/2016   LDLDIRECT 92.0 12/31/2015   LDLCALC 91 06/20/2016   ALT 13 06/20/2016   AST 13 06/20/2016   NA 142 06/20/2016   K 3.8 06/20/2016   CL 103 06/20/2016   CREATININE 0.84 06/20/2016   BUN 12 06/20/2016   CO2 35 (H) 06/20/2016   TSH 2.73 12/31/2015   INR 3.6 (H) 01/20/2017   HGBA1C 6.4 06/20/2016    Mm Diag Breast Tomo Bilateral  Result Date: 01/13/2017 CLINICAL DATA:  83 year old female presenting for annual evaluation status post left breast lumpectomy for invasive lobular carcinoma in 2011. The patient has had 2 subsequent biopsies in the left breast which were benign. EXAM: 2D DIGITAL DIAGNOSTIC BILATERAL MAMMOGRAM WITH CAD AND ADJUNCT TOMO COMPARISON:  Previous exam(s). ACR Breast Density Category b: There are scattered areas of fibroglandular density. FINDINGS: The focal asymmetry in the inferior left breast slightly medial to the lumpectomy site appears less dense than on the comparison exam, and does contain interspersed  fat and coarse heterogeneous calcifications which appear dystrophic. This most likely represents fat necrosis. In addition, there is a developing asymmetry immediately inferior and lateral to the lumpectomy site which on the CC view spans approximately 1.3 cm. This is similar in appearance with interspersed fat and dystrophic calcifications and also likely represents fat necrosis. There are no new suspicious changes within the lumpectomy site itself. No suspicious calcifications, masses or areas of distortion are seen in the bilateral breasts. Mammographic images were processed with CAD. IMPRESSION: 1. The focal asymmetry previously seen in the inferior left breast, medial to the lumpectomy site has changes supporting that it represents fat necrosis. 2. There is a developing asymmetry inferior and lateral to the lumpectomy site which has a similar appearance as the other sites of fat necrosis in the left breast. 3.  Stable left breast lumpectomy site. 4.  No mammographic evidence of right breast malignancy. RECOMMENDATION: Six-month follow-up diagnostic left breast mammogram is recommended to evaluate the probably benign areas of probable fat necrosis in the left breast. I have discussed the findings and recommendations with the patient. Results were also provided in writing at the conclusion of the visit. If applicable, a reminder letter will be sent to the patient regarding the next appointment. BI-RADS CATEGORY  3: Probably benign. Electronically Signed   By: Ammie Ferrier M.D.   On: 01/13/2017 15:22       Assessment & Plan:   Problem List Items Addressed This Visit    Atrial fibrillation ([place])    On coumadin.  Check pt/inr today since starting abx.        Cough - Primary    Persistent increased cough and congestion as outlined.  Check cxr.  Treat with azitromycin and prednisone taper as directed.  Saline nasal spray, nasacort nasal spray and robitussin as directed.  Rest.  Fluids.  Follow.         Relevant Orders   DG Chest 2 View (  Completed)   GERD    Controlled on omeprazole.         Other Visit Diagnoses    Wheezing       Relevant Orders   DG Chest 2 View (Completed)   Anticoagulated on Coumadin       Relevant Orders   Protime-INR (Completed)       Einar Pheasant, MD

## 2017-01-17 NOTE — Progress Notes (Signed)
Pre-visit discussion using our clinic review tool. No additional management support is needed unless otherwise documented below in the visit note.  

## 2017-01-18 ENCOUNTER — Other Ambulatory Visit: Payer: Self-pay | Admitting: Internal Medicine

## 2017-01-18 DIAGNOSIS — Z7901 Long term (current) use of anticoagulants: Secondary | ICD-10-CM

## 2017-01-18 NOTE — Progress Notes (Signed)
Order placed for f/u pt/inr 

## 2017-01-18 NOTE — Progress Notes (Signed)
Placed on schedule thanks

## 2017-01-20 ENCOUNTER — Ambulatory Visit (INDEPENDENT_AMBULATORY_CARE_PROVIDER_SITE_OTHER): Payer: Medicare Other | Admitting: Podiatry

## 2017-01-20 ENCOUNTER — Other Ambulatory Visit (INDEPENDENT_AMBULATORY_CARE_PROVIDER_SITE_OTHER): Payer: Medicare Other

## 2017-01-20 ENCOUNTER — Ambulatory Visit (INDEPENDENT_AMBULATORY_CARE_PROVIDER_SITE_OTHER): Payer: Medicare Other

## 2017-01-20 DIAGNOSIS — M79671 Pain in right foot: Secondary | ICD-10-CM

## 2017-01-20 DIAGNOSIS — Z7901 Long term (current) use of anticoagulants: Secondary | ICD-10-CM

## 2017-01-20 DIAGNOSIS — M722 Plantar fascial fibromatosis: Secondary | ICD-10-CM | POA: Diagnosis not present

## 2017-01-20 LAB — PROTIME-INR
INR: 3.6 ratio — AB (ref 0.8–1.0)
PROTHROMBIN TIME: 38.8 s — AB (ref 9.6–13.1)

## 2017-01-20 NOTE — Addendum Note (Signed)
Addended by: Leeanne Rio on: 01/20/2017 11:25 AM   Modules accepted: Orders

## 2017-01-22 MED ORDER — BETAMETHASONE SOD PHOS & ACET 6 (3-3) MG/ML IJ SUSP: 3.0000 mg | Freq: Once | INTRAMUSCULAR | Status: DC

## 2017-01-22 NOTE — Progress Notes (Signed)
   Subjective: Patient presents today for sharp pain and tenderness in the right foot located on the plantar aspect and posterior heel onset 6 weeks ago. Patient states that the pain is worse at the end of the day. Patient presents today for further treatment and evaluation.  Of note, pt is currently being treated for bronchitis with Prednisone and Azithromycin.   Objective: Physical Exam General: The patient is alert and oriented x3 in no acute distress.  Dermatology: Skin is warm, dry and supple bilateral lower extremities. Negative for open lesions or macerations bilateral.   Vascular: Dorsalis Pedis and Posterior Tibial pulses palpable bilateral.  Capillary fill time is immediate to all digits.  Neurological: Epicritic and protective threshold intact bilateral.   Musculoskeletal: Tenderness to palpation at the medial calcaneal tubercale and through the insertion of the plantar fascia of the right foot. All other joints range of motion within normal limits bilateral. Strength 5/5 in all groups bilateral. Hammertoe contracture deformity noted to the fifth digit of the right foot   Radiographic exam: Normal osseous mineralization. Joint spaces preserved. No fracture/dislocation/boney destruction. Calcaneal spur present with mild thickening of plantar fascia right. No other soft tissue abnormalities or radiopaque foreign bodies.   Hammertoe contracture deformity noted to the interphalangeal joints and MPJ of the respective hammertoe digits mentioned on clinical musculoskeletal exam.  Assessment: 1. Plantar fasciitis right 2. Pain in right foot 3. Hammertoe right fifth digit  Plan of Care:  1. Patient evaluated. Xrays reviewed.   2. Injection of 0.5cc Celestone soluspan injected into the right heel at the insertion of the plantar fascia.  3. Instructed patient regarding therapies and modalities at home to alleviate symptoms.  4. Plantar fascial band(s) dispensed. 5. Silicone toe pad  for fifth right toe dispensed 6. Return to clinic in 4 weeks.     Edrick Kins, DPM [facility]  Dr. Edrick Kins, [address]                                        [place], [place] [address]                Office [phone]  Fax [phone]

## 2017-01-23 ENCOUNTER — Other Ambulatory Visit (INDEPENDENT_AMBULATORY_CARE_PROVIDER_SITE_OTHER): Payer: Medicare Other

## 2017-01-23 ENCOUNTER — Encounter: Payer: Self-pay | Admitting: Internal Medicine

## 2017-01-23 DIAGNOSIS — R059 Cough, unspecified: Secondary | ICD-10-CM | POA: Insufficient documentation

## 2017-01-23 DIAGNOSIS — Z7901 Long term (current) use of anticoagulants: Secondary | ICD-10-CM

## 2017-01-23 DIAGNOSIS — R05 Cough: Secondary | ICD-10-CM | POA: Insufficient documentation

## 2017-01-23 LAB — PROTIME-INR
INR: 2.7 ratio — ABNORMAL HIGH (ref 0.8–1.0)
PROTHROMBIN TIME: 29.2 s — AB (ref 9.6–13.1)

## 2017-01-23 NOTE — Assessment & Plan Note (Signed)
Controlled on omeprazole.   

## 2017-01-23 NOTE — Assessment & Plan Note (Signed)
Persistent increased cough and congestion as outlined.  Check cxr.  Treat with azitromycin and prednisone taper as directed.  Saline nasal spray, nasacort nasal spray and robitussin as directed.  Rest.  Fluids.  Follow.

## 2017-01-23 NOTE — Assessment & Plan Note (Signed)
On coumadin.  Check pt/inr today since starting abx.

## 2017-01-24 ENCOUNTER — Ambulatory Visit (INDEPENDENT_AMBULATORY_CARE_PROVIDER_SITE_OTHER): Payer: Medicare Other | Admitting: General Surgery

## 2017-01-24 ENCOUNTER — Encounter: Payer: Self-pay | Admitting: General Surgery

## 2017-01-24 VITALS — BP 114/58 | HR 60 | Resp 14 | Ht 67.0 in | Wt 182.0 lb

## 2017-01-24 DIAGNOSIS — Z853 Personal history of malignant neoplasm of breast: Secondary | ICD-10-CM | POA: Diagnosis not present

## 2017-01-24 NOTE — Progress Notes (Signed)
Patient ID: Kerri Mills, female   DOB: 01-19-1931, 83 y.o.   MRN: [id number]  Chief Complaint  Patient presents with  . Follow-up    HPI Kerri Mills is a 83 y.o. female who presents for a breast evaluation. The most recent mammogram was done on 01/13/2017 .  Patient does perform regular self breast checks and gets regular mammograms done.  She states she had some bad problems and foot issues.    HPI  Past Medical History:  Diagnosis Date  . Allergy   . Anxiety   . Arthritis   . Breast CA ([place])   . Breast cancer ([place]) 2011   LT LUMPECTOMY  . Colitis   . Depression   . Diverticulitis 2013  . Gastric ulcer   . GERD (gastroesophageal reflux disease)   . Heart disease   . Hypercholesterolemia   . Hypertension   . Infiltrating lobular carcinoma of left breast 2011   T2,N0, ER: 90%; PR 0%; Her 2 neu not amplified. [facility]).  . Melanoma ([place]) 1997  . Melanoma in situ of upper extremity ([place]) 03/19/2011  . Personal history of radiation therapy 2011   BREAST CA  . Seroma    HISTORY OF LFT BREAST  . Sleep apnea   . Thyroid cancer ([place]) 03/19/2011    Past Surgical History:  Procedure Laterality Date  . ABDOMINAL HYSTERECTOMY  1973   partial  . BREAST BIOPSY Left 02-13-13   BENIGN BREAST TISSUE WITH CHANGES CONSISTENT WITH FAT NECROSIS  . BREAST BIOPSY Left 01/21/2015   bx done in [facility] 11:00 left 6-8cmfn  . BREAST EXCISIONAL BIOPSY Left 1995   neg  . BREAST EXCISIONAL BIOPSY Left 2011   Breast cancer radiation  . BREAST LUMPECTOMY Left 2011   BREAST CA  . CHOLECYSTECTOMY    . COLONOSCOPY  2013  . MELANOMA EXCISION     RT UPPER ARM  . PARTIAL HYSTERECTOMY     bleeding, ovaries in place.    . THYROID SURGERY     FOR THYROID CANCER  . TONSILLECTOMY      Family History  Problem Relation Age of Onset  . Heart disease Mother   . Cancer Brother     lung   . Breast cancer Neg Hx     Social History Social History  Substance Use Topics  .  Smoking status: Never Smoker  . Smokeless tobacco: Never Used  . Alcohol use 0.0 oz/week     Comment: social drinking. average times a week    Allergies  Allergen Reactions  . Atorvastatin     Other reaction(s): Other (See Comments) STIFFNESS AND SORE STIFFNESS AND SORE  . Lipitor [Atorvastatin Calcium] Other (See Comments)    Stiffness & soreness  . Penicillins Rash    REACTION: Unknown reaction    Current Outpatient Prescriptions  Medication Sig Dispense Refill  . albuterol (PROAIR HFA) 108 (90 Base) MCG/ACT inhaler Inhale 2 puffs into the lungs every 6 (six) hours as needed for wheezing or shortness of breath. 1 Inhaler 3  . azithromycin (ZITHROMAX) 250 MG tablet Take 2 tablets x 1 day and then one tablet per day for four more days 6 tablet 0  . Cholecalciferol (VITAMIN D) 1000 UNITS capsule Take 1,000 Units by mouth daily.      . DULoxetine (CYMBALTA) 60 MG capsule TAKE 1 CAPSULE (60 MG TOTAL) BY MOUTH AT BEDTIME. 90 capsule 1  . fluticasone (FLONASE) 50 MCG/ACT nasal spray Place 2  sprays into both nostrils daily. 16 g 6  . gabapentin (NEURONTIN) 300 MG capsule TAKE 2 CAPSULES (600 MG TOTAL) BY MOUTH AT BEDTIME. 180 capsule 1  . levothyroxine (SYNTHROID, LEVOTHROID) 88 MCG tablet TAKE ONE TABLET BY MOUTH ONCE DAILY BEFORE BREAKFAST 90 tablet 3  . losartan-hydrochlorothiazide (HYZAAR) 100-12.5 MG tablet TAKE 1 TABLET BY MOUTH DAILY. 90 tablet 2  . lovastatin (MEVACOR) 40 MG tablet TAKE 1 TABLET (40 MG TOTAL) BY MOUTH DAILY. 90 tablet 2  . Multiple Vitamins-Minerals (PRESERVISION AREDS 2 PO) Take 2 tablets by mouth daily.      Marland Kitchen omeprazole (PRILOSEC) 20 MG capsule TAKE 1 CAPSULE (20 MG TOTAL) BY MOUTH DAILY. 90 capsule 3  . predniSONE (DELTASONE) 10 MG tablet Take 6 tablets x 1 day and then decrease by 1/2 tablet per day until down to zero mg. 39 tablet 0  . propranolol (INDERAL) 10 MG tablet TAKE 1 TABLET (10 MG TOTAL) BY MOUTH DAILY AS NEEDED. 90 tablet 0  . warfarin (COUMADIN) 3  MG tablet Take 1 and 1/2 tablet on Wednesday and one tablet all other days of the week. (Patient taking differently: Take 1 and 1/2 (4.5mg ) tablet on Wednesday & Sunday and 1 (3mg ) tablet all other days of the week.) 100 tablet 1   Current Facility-Administered Medications  Medication Dose Route Frequency Provider Last Rate Last Dose  . betamethasone acetate-betamethasone sodium phosphate (CELESTONE) injection 3 mg  3 mg Intramuscular Once Edrick Kins, DPM        Review of Systems Review of Systems  Constitutional: Negative.   Respiratory: Negative.   Cardiovascular: Negative.     Blood pressure (!) 114/58, pulse 60, resp. rate 14, height 5\' 7"  (1.702 m), weight 182 lb (82.6 kg).  Physical Exam Physical Exam  Constitutional: She is oriented to person, place, and time. She appears well-developed and well-nourished.  Eyes: Conjunctivae are normal. No scleral icterus.  Neck: Neck supple.  Cardiovascular: Normal rate, regular rhythm and normal heart sounds.   Pulmonary/Chest: Effort normal and breath sounds normal. Right breast exhibits no inverted nipple, no mass, no nipple discharge, no skin change and no tenderness. Left breast exhibits no inverted nipple, no mass, no nipple discharge, no skin change and no tenderness. Breasts are asymmetrical ( right breast 2 cup size bigger than left ).    Lymphadenopathy:    She has no cervical adenopathy.    She has no axillary adenopathy.  Neurological: She is alert and oriented to person, place, and time.  Skin: Skin is warm and dry.    Data Reviewed 01/13/2017 bilateral mammogram reviewed. Ongoing and progressive fat necrosis. Recommendation for six-month follow-up. BI-RADS-3.  Biopsy of the left breast dated 07/06/2015 showed fat necrosis.  Assessment    Stable clinical breast exam, no evidence of recurrent malignancy.  Ongoing changes status post open breast radiation on the left.    Plan    Indication for six-month follow-up  reviewed with the patient. Considering the marked distortion of the breast and previous biopsies were both comfortable with a 1 year follow-up.   The patient has been asked to return to the office in one year with a bilateral diagnostic mammogram.  HPI, Physical Exam, Assessment and Plan have been scribed under the direction and in the presence of Hervey Ard, MD.  Gaspar Cola, CMA  I have completed the exam and reviewed the above documentation for accuracy and completeness.  I agree with the above.  Haematologist has been used and  any errors in dictation or transcription are unintentional.  Hervey Ard, M.D., F.A.C.S.   Kerri Mills 01/25/2017, 7:00 AM

## 2017-01-24 NOTE — Patient Instructions (Signed)
The patient has been asked to return to the office in one year with a bilateral diagnostic mammogram. 

## 2017-01-27 ENCOUNTER — Encounter: Payer: Self-pay | Admitting: Internal Medicine

## 2017-01-27 ENCOUNTER — Ambulatory Visit (INDEPENDENT_AMBULATORY_CARE_PROVIDER_SITE_OTHER): Payer: Medicare Other | Admitting: Internal Medicine

## 2017-01-27 VITALS — BP 114/60 | HR 74 | Temp 98.6°F | Resp 12 | Ht 67.0 in | Wt 185.0 lb

## 2017-01-27 DIAGNOSIS — G4733 Obstructive sleep apnea (adult) (pediatric): Secondary | ICD-10-CM | POA: Diagnosis not present

## 2017-01-27 DIAGNOSIS — D0360 Melanoma in situ of unspecified upper limb, including shoulder: Secondary | ICD-10-CM

## 2017-01-27 DIAGNOSIS — Z5181 Encounter for therapeutic drug level monitoring: Secondary | ICD-10-CM | POA: Diagnosis not present

## 2017-01-27 DIAGNOSIS — Z7901 Long term (current) use of anticoagulants: Secondary | ICD-10-CM | POA: Diagnosis not present

## 2017-01-27 DIAGNOSIS — R739 Hyperglycemia, unspecified: Secondary | ICD-10-CM | POA: Diagnosis not present

## 2017-01-27 DIAGNOSIS — E2839 Other primary ovarian failure: Secondary | ICD-10-CM

## 2017-01-27 DIAGNOSIS — M79671 Pain in right foot: Secondary | ICD-10-CM | POA: Diagnosis not present

## 2017-01-27 DIAGNOSIS — C50919 Malignant neoplasm of unspecified site of unspecified female breast: Secondary | ICD-10-CM

## 2017-01-27 DIAGNOSIS — K219 Gastro-esophageal reflux disease without esophagitis: Secondary | ICD-10-CM | POA: Diagnosis not present

## 2017-01-27 DIAGNOSIS — I4891 Unspecified atrial fibrillation: Secondary | ICD-10-CM | POA: Diagnosis not present

## 2017-01-27 DIAGNOSIS — R05 Cough: Secondary | ICD-10-CM

## 2017-01-27 DIAGNOSIS — R059 Cough, unspecified: Secondary | ICD-10-CM

## 2017-01-27 LAB — PROTIME-INR
INR: 2 ratio — ABNORMAL HIGH (ref 0.8–1.0)
Prothrombin Time: 21.8 s — ABNORMAL HIGH (ref 9.6–13.1)

## 2017-01-27 NOTE — Progress Notes (Signed)
Pre-visit discussion using our clinic review tool. No additional management support is needed unless otherwise documented below in the visit note.  

## 2017-01-27 NOTE — Progress Notes (Signed)
Patient ID: Kerri Mills, female   DOB: 1930/10/31, 83 y.o.   MRN: [id number]   Subjective:    Patient ID: Kerri Mills, female    DOB: September 12, 1931, 83 y.o.   MRN: [id number]  HPI  Patient here for a scheduled follow up.  She was seen as a work in on 01/17/17 with increased cough and congestion.  Refer to previous note.  Was placed on abx and prednisone.  Breathing better.  Cough is better.  Feels better.  Eating and drinking well.  No acid reflux.  No abdominal pain.  Bowels moving.  Having right foot pain.  Seeing podiatry.  S/p injection.     Past Medical History:  Diagnosis Date  . Allergy   . Anxiety   . Arthritis   . Breast CA ([place])   . Breast cancer ([place]) 2011   LT LUMPECTOMY  . Colitis   . Depression   . Diverticulitis 2013  . Gastric ulcer   . GERD (gastroesophageal reflux disease)   . Heart disease   . Hypercholesterolemia   . Hypertension   . Infiltrating lobular carcinoma of left breast 2011   T2,N0, ER: 90%; PR 0%; Her 2 neu not amplified. [facility]).  . Melanoma ([place]) 1997  . Melanoma in situ of upper extremity ([place]) 03/19/2011  . Personal history of radiation therapy 2011   BREAST CA  . Seroma    HISTORY OF LFT BREAST  . Sleep apnea   . Thyroid cancer ([place]) 03/19/2011   Past Surgical History:  Procedure Laterality Date  . ABDOMINAL HYSTERECTOMY  1973   partial  . BREAST BIOPSY Left 02-13-13   BENIGN BREAST TISSUE WITH CHANGES CONSISTENT WITH FAT NECROSIS  . BREAST BIOPSY Left 01/21/2015   bx done in [place] office 11:00 left 6-8cmfn  . BREAST EXCISIONAL BIOPSY Left 1995   neg  . BREAST EXCISIONAL BIOPSY Left 2011   Breast cancer radiation  . BREAST LUMPECTOMY Left 2011   BREAST CA  . CHOLECYSTECTOMY    . COLONOSCOPY  2013  . MELANOMA EXCISION     RT UPPER ARM  . PARTIAL HYSTERECTOMY     bleeding, ovaries in place.    . THYROID SURGERY     FOR THYROID CANCER  . TONSILLECTOMY     Family History  Problem Relation Age of Onset  . Heart  disease Mother   . Cancer Brother        lung   . Breast cancer Neg Hx    Social History   Social History  . Marital status: Widowed    Spouse name: N/A  . Number of children: N/A  . Years of education: N/A   Social History Main Topics  . Smoking status: Never Smoker  . Smokeless tobacco: Never Used  . Alcohol use 0.0 oz/week     Comment: social drinking. average times a week  . Drug use: No  . Sexual activity: No   Other Topics Concern  . None   Social History Narrative  . None    Outpatient Encounter Prescriptions as of 01/27/2017  Medication Sig  . albuterol (PROAIR HFA) 108 (90 Base) MCG/ACT inhaler Inhale 2 puffs into the lungs every 6 (six) hours as needed for wheezing or shortness of breath.  . Cholecalciferol (VITAMIN D) 1000 UNITS capsule Take 1,000 Units by mouth daily.    . DULoxetine (CYMBALTA) 60 MG capsule TAKE 1 CAPSULE (60 MG TOTAL) BY MOUTH AT BEDTIME.  . fluticasone (FLONASE)  50 MCG/ACT nasal spray Place 2 sprays into both nostrils daily.  Marland Kitchen gabapentin (NEURONTIN) 300 MG capsule TAKE 2 CAPSULES (600 MG TOTAL) BY MOUTH AT BEDTIME.  Marland Kitchen levothyroxine (SYNTHROID, LEVOTHROID) 88 MCG tablet TAKE ONE TABLET BY MOUTH ONCE DAILY BEFORE BREAKFAST  . losartan-hydrochlorothiazide (HYZAAR) 100-12.5 MG tablet TAKE 1 TABLET BY MOUTH DAILY.  Marland Kitchen lovastatin (MEVACOR) 40 MG tablet TAKE 1 TABLET (40 MG TOTAL) BY MOUTH DAILY.  . Multiple Vitamins-Minerals (PRESERVISION AREDS 2 PO) Take 2 tablets by mouth daily.    Marland Kitchen omeprazole (PRILOSEC) 20 MG capsule TAKE 1 CAPSULE (20 MG TOTAL) BY MOUTH DAILY.  Marland Kitchen predniSONE (DELTASONE) 10 MG tablet Take 6 tablets x 1 day and then decrease by 1/2 tablet per day until down to zero mg.  . propranolol (INDERAL) 10 MG tablet TAKE 1 TABLET (10 MG TOTAL) BY MOUTH DAILY AS NEEDED.  Marland Kitchen warfarin (COUMADIN) 3 MG tablet Take 1 and 1/2 tablet on Wednesday and one tablet all other days of the week. (Patient taking differently: Take 1 and 1/2 (4.13m) tablet on  Wednesday & Sunday and 1 (333m tablet all other days of the week.)  . [DISCONTINUED] azithromycin (ZITHROMAX) 250 MG tablet Take 2 tablets x 1 day and then one tablet per day for four more days   Facility-Administered Encounter Medications as of 01/27/2017  Medication  . betamethasone acetate-betamethasone sodium phosphate (CELESTONE) injection 3 mg    Review of Systems  Constitutional: Negative for appetite change and unexpected weight change.  HENT: Negative for congestion and sinus pressure.   Respiratory: Positive for cough. Negative for chest tightness and shortness of breath.        Cough improved.    Cardiovascular: Negative for chest pain, palpitations and leg swelling.  Gastrointestinal: Negative for abdominal pain, diarrhea, nausea and vomiting.  Genitourinary: Negative for difficulty urinating and dysuria.  Musculoskeletal: Negative for back pain and joint swelling.  Skin: Negative for color change and rash.  Neurological: Negative for dizziness, light-headedness and headaches.  Psychiatric/Behavioral: Negative for agitation and dysphoric mood.       Objective:    Physical Exam  Constitutional: She appears well-developed and well-nourished. No distress.  HENT:  Nose: Nose normal.  Mouth/Throat: Oropharynx is clear and moist.  Neck: Neck supple. No thyromegaly present.  Cardiovascular: Normal rate and regular rhythm.   Pulmonary/Chest: Breath sounds normal. No respiratory distress. She has no wheezes.  Abdominal: Soft. Bowel sounds are normal. There is no tenderness.  Musculoskeletal: She exhibits no edema or tenderness.  Lymphadenopathy:    She has no cervical adenopathy.  Skin: No rash noted. No erythema.  Psychiatric: She has a normal mood and affect. Her behavior is normal.    BP 114/60 (BP Location: Left Arm, Patient Position: Sitting, Cuff Size: Normal)   Pulse 74   Temp 98.6 F (37 C) (Oral)   Resp 12   Ht 5' 7"  (1.702 m)   Wt 185 lb (83.9 kg)   SpO2 96%    BMI 28.98 kg/m  Wt Readings from Last 3 Encounters:  01/27/17 185 lb (83.9 kg)  01/24/17 182 lb (82.6 kg)  01/17/17 186 lb 3.2 oz (84.5 kg)     Lab Results  Component Value Date   WBC 5.8 06/20/2016   HGB 12.2 06/20/2016   HCT 36.5 06/20/2016   PLT 247.0 06/20/2016   GLUCOSE 95 06/20/2016   CHOL 175 06/20/2016   TRIG 186.0 (H) 06/20/2016   HDL 47.20 06/20/2016   LDLDIRECT 92.0 12/31/2015  LDLCALC 91 06/20/2016   ALT 13 06/20/2016   AST 13 06/20/2016   NA 142 06/20/2016   K 3.8 06/20/2016   CL 103 06/20/2016   CREATININE 0.84 06/20/2016   BUN 12 06/20/2016   CO2 35 (H) 06/20/2016   TSH 2.73 12/31/2015   INR 2.0 (H) 01/27/2017   HGBA1C 6.4 06/20/2016    Mm Diag Breast Tomo Bilateral  Result Date: 01/13/2017 CLINICAL DATA:  83 year old female presenting for annual evaluation status post left breast lumpectomy for invasive lobular carcinoma in 2011. The patient has had 2 subsequent biopsies in the left breast which were benign. EXAM: 2D DIGITAL DIAGNOSTIC BILATERAL MAMMOGRAM WITH CAD AND ADJUNCT TOMO COMPARISON:  Previous exam(s). ACR Breast Density Category b: There are scattered areas of fibroglandular density. FINDINGS: The focal asymmetry in the inferior left breast slightly medial to the lumpectomy site appears less dense than on the comparison exam, and does contain interspersed fat and coarse heterogeneous calcifications which appear dystrophic. This most likely represents fat necrosis. In addition, there is a developing asymmetry immediately inferior and lateral to the lumpectomy site which on the CC view spans approximately 1.3 cm. This is similar in appearance with interspersed fat and dystrophic calcifications and also likely represents fat necrosis. There are no new suspicious changes within the lumpectomy site itself. No suspicious calcifications, masses or areas of distortion are seen in the bilateral breasts. Mammographic images were processed with CAD. IMPRESSION:  1. The focal asymmetry previously seen in the inferior left breast, medial to the lumpectomy site has changes supporting that it represents fat necrosis. 2. There is a developing asymmetry inferior and lateral to the lumpectomy site which has a similar appearance as the other sites of fat necrosis in the left breast. 3.  Stable left breast lumpectomy site. 4.  No mammographic evidence of right breast malignancy. RECOMMENDATION: Six-month follow-up diagnostic left breast mammogram is recommended to evaluate the probably benign areas of probable fat necrosis in the left breast. I have discussed the findings and recommendations with the patient. Results were also provided in writing at the conclusion of the visit. If applicable, a reminder letter will be sent to the patient regarding the next appointment. BI-RADS CATEGORY  3: Probably benign. Electronically Signed   By: Ammie Ferrier M.D.   On: 01/13/2017 15:22       Assessment & Plan:   Problem List Items Addressed This Visit    Atrial fibrillation ([place])    On coumadin.  Recheck pt/inr today.  Will need to monitor closely on abx.  Rate controlled.        Cough    Cough and congestion better.  Feels better.  Follow.        GERD    Controlled on omeprazole.        Hyperglycemia    Low carb diet and exercise.  Follow met b and a1c.        Infiltrating lobular carcinoma of left breast    Saw Dr Bary Castilla 01/25/17.  Recommended f/u mammogram in one year.        Melanoma in situ of upper extremity ([place])    Followed by dermatology.       Obstructive sleep apnea    CPAP.         Other Visit Diagnoses    Estrogen deficiency    -  Primary   Relevant Orders   DG Bone Density   Anticoagulated on Coumadin       Relevant Orders  Protime-INR (Completed)   Right foot pain       saw podiatry.  s/p injection.         Einar Pheasant, MD

## 2017-01-30 ENCOUNTER — Other Ambulatory Visit: Payer: Self-pay | Admitting: Radiology

## 2017-01-30 DIAGNOSIS — Z5181 Encounter for therapeutic drug level monitoring: Secondary | ICD-10-CM

## 2017-01-30 DIAGNOSIS — Z7901 Long term (current) use of anticoagulants: Principal | ICD-10-CM

## 2017-01-31 ENCOUNTER — Other Ambulatory Visit: Payer: Medicare Other

## 2017-02-05 ENCOUNTER — Encounter: Payer: Self-pay | Admitting: Internal Medicine

## 2017-02-05 NOTE — Assessment & Plan Note (Signed)
On coumadin.  Recheck pt/inr today.  Will need to monitor closely on abx.  Rate controlled.

## 2017-02-05 NOTE — Assessment & Plan Note (Signed)
Followed by dermatology

## 2017-02-05 NOTE — Assessment & Plan Note (Signed)
Controlled on omeprazole.   

## 2017-02-05 NOTE — Assessment & Plan Note (Signed)
CPAP.  

## 2017-02-05 NOTE — Assessment & Plan Note (Signed)
Saw Dr Bary Castilla 01/25/17.  Recommended f/u mammogram in one year.

## 2017-02-05 NOTE — Assessment & Plan Note (Signed)
Low carb diet and exercise.  Follow met b and a1c.   

## 2017-02-05 NOTE — Assessment & Plan Note (Signed)
Cough and congestion better.  Feels better.  Follow.

## 2017-02-06 ENCOUNTER — Other Ambulatory Visit (INDEPENDENT_AMBULATORY_CARE_PROVIDER_SITE_OTHER): Payer: Medicare Other

## 2017-02-06 DIAGNOSIS — Z5181 Encounter for therapeutic drug level monitoring: Secondary | ICD-10-CM

## 2017-02-06 DIAGNOSIS — Z7901 Long term (current) use of anticoagulants: Secondary | ICD-10-CM | POA: Diagnosis not present

## 2017-02-06 LAB — PROTIME-INR
INR: 2.8 ratio — AB (ref 0.8–1.0)
Prothrombin Time: 30.1 s — ABNORMAL HIGH (ref 9.6–13.1)

## 2017-02-13 ENCOUNTER — Telehealth: Payer: Self-pay | Admitting: Radiology

## 2017-02-13 ENCOUNTER — Other Ambulatory Visit: Payer: Self-pay | Admitting: Internal Medicine

## 2017-02-13 DIAGNOSIS — I4891 Unspecified atrial fibrillation: Secondary | ICD-10-CM

## 2017-02-13 DIAGNOSIS — C73 Malignant neoplasm of thyroid gland: Secondary | ICD-10-CM

## 2017-02-13 DIAGNOSIS — R739 Hyperglycemia, unspecified: Secondary | ICD-10-CM

## 2017-02-13 NOTE — Telephone Encounter (Signed)
This was given to her when she was sick.  She should not need an automatic refill.  Please call pt and see if she requested a refill and why requested.  If having acute issues, she needs to be evaluated.

## 2017-02-13 NOTE — Progress Notes (Signed)
Order placed for labs.

## 2017-02-13 NOTE — Telephone Encounter (Signed)
Orders placed for labs

## 2017-02-13 NOTE — Telephone Encounter (Signed)
Pt coming in for labs, please place future orders. Thank you 

## 2017-02-13 NOTE — Telephone Encounter (Signed)
Please advise, thanks.

## 2017-02-14 ENCOUNTER — Other Ambulatory Visit: Payer: Medicare Other

## 2017-02-14 NOTE — Telephone Encounter (Signed)
Spoke with the patient, she didn't request this. Thanks refused.

## 2017-02-16 ENCOUNTER — Other Ambulatory Visit (INDEPENDENT_AMBULATORY_CARE_PROVIDER_SITE_OTHER): Payer: Medicare Other

## 2017-02-16 DIAGNOSIS — R739 Hyperglycemia, unspecified: Secondary | ICD-10-CM

## 2017-02-16 DIAGNOSIS — I4891 Unspecified atrial fibrillation: Secondary | ICD-10-CM

## 2017-02-16 DIAGNOSIS — C73 Malignant neoplasm of thyroid gland: Secondary | ICD-10-CM | POA: Diagnosis not present

## 2017-02-16 LAB — PROTIME-INR
INR: 2.1 ratio — AB (ref 0.8–1.0)
PROTHROMBIN TIME: 22.3 s — AB (ref 9.6–13.1)

## 2017-02-16 LAB — COMPREHENSIVE METABOLIC PANEL
ALK PHOS: 101 U/L (ref 39–117)
ALT: 13 U/L (ref 0–35)
AST: 16 U/L (ref 0–37)
Albumin: 4 g/dL (ref 3.5–5.2)
BILIRUBIN TOTAL: 0.5 mg/dL (ref 0.2–1.2)
BUN: 13 mg/dL (ref 6–23)
CALCIUM: 10 mg/dL (ref 8.4–10.5)
CO2: 33 meq/L — AB (ref 19–32)
Chloride: 103 mEq/L (ref 96–112)
Creatinine, Ser: 0.81 mg/dL (ref 0.40–1.20)
GFR: 71.66 mL/min (ref >60.00)
GLUCOSE: 130 mg/dL — AB (ref 70–99)
POTASSIUM: 4 meq/L (ref 3.5–5.1)
Sodium: 139 mEq/L (ref 135–145)
TOTAL PROTEIN: 6.6 g/dL (ref 6.0–8.3)

## 2017-02-16 LAB — TSH: TSH: 1.3 u[IU]/mL (ref 0.35–4.50)

## 2017-02-16 LAB — HEMOGLOBIN A1C: HEMOGLOBIN A1C: 6.5 % (ref 4.6–6.5)

## 2017-02-17 ENCOUNTER — Ambulatory Visit (INDEPENDENT_AMBULATORY_CARE_PROVIDER_SITE_OTHER): Payer: Medicare Other | Admitting: Podiatry

## 2017-02-17 ENCOUNTER — Other Ambulatory Visit: Payer: Self-pay | Admitting: Internal Medicine

## 2017-02-17 ENCOUNTER — Encounter: Payer: Self-pay | Admitting: Podiatry

## 2017-02-17 DIAGNOSIS — M722 Plantar fascial fibromatosis: Secondary | ICD-10-CM

## 2017-02-17 DIAGNOSIS — E7800 Pure hypercholesterolemia, unspecified: Secondary | ICD-10-CM

## 2017-02-17 DIAGNOSIS — I4891 Unspecified atrial fibrillation: Secondary | ICD-10-CM

## 2017-02-17 DIAGNOSIS — M2041 Other hammer toe(s) (acquired), right foot: Secondary | ICD-10-CM

## 2017-02-17 NOTE — Progress Notes (Signed)
Order placed for f/u labs.  

## 2017-02-18 NOTE — Progress Notes (Signed)
   Subjective: Patient presents today for follow up evaluation of sharp pain and tenderness in the right foot located on the plantar aspect and posterior heel. She states she is doing significantly better. She reports moderate relief after receiving the injection at last visit. She has been wearing the plantar fasciitis brace which has also helped to provide relief of the pain.   Objective: Physical Exam General: The patient is alert and oriented x3 in no acute distress.  Dermatology: Skin is warm, dry and supple bilateral lower extremities. Negative for open lesions or macerations bilateral.   Vascular: Dorsalis Pedis and Posterior Tibial pulses palpable bilateral.  Capillary fill time is immediate to all digits.  Neurological: Epicritic and protective threshold intact bilateral.   Musculoskeletal: Tenderness to palpation at the medial calcaneal tubercale and through the insertion of the plantar fascia of the right foot. All other joints range of motion within normal limits bilateral. Strength 5/5 in all groups bilateral. Hammertoe contracture deformity noted to the fifth digit of the right foot   Assessment: 1. Plantar fasciitis right 2. Pain in right foot 3. Hammertoe right fifth digit  Plan of Care:  1. Patient evaluated. 2. Injection of 0.5cc Celestone soluspan injected into the right heel at the insertion of the plantar fascia.  3. Today we discussed the conservative versus surgical management of the presenting pathology. The patient opts for surgical management. All possible complications and details of the procedure were explained. All patient questions were answered. No guarantees were expressed or implied. 4. Authorization for surgery was initiated today. Surgery will consist of derotational arthroplasty of the right fifth digit with right 5th MPJ capsulotomy. 5. Return to clinic 1 week postop.   Edrick Kins, DPM [facility]  Dr. Edrick Kins, [address]                                        [place], [place] [address]                Office [phone]  Fax [phone]

## 2017-02-21 ENCOUNTER — Telehealth: Payer: Self-pay | Admitting: *Deleted

## 2017-02-21 NOTE — Telephone Encounter (Addendum)
"  I am a patient of Dr. Amalia Hailey in [place].  I'm calling to schedule surgery.  Please give me a call."  "I saw Dr. Amalia Hailey in [place] this morning and I'm to have surgery.  I was told to call you."

## 2017-02-21 NOTE — Telephone Encounter (Signed)
"  This is Pharmacist, hospital in [place].  I called you on Friday two different times and this is Tuesday!  l was to schedule an appointment to have a toe operated on.  Please give me a call.  Thank you!    I attempted to return her call.  I do not know what her procedure will be at this time.  I will have to set her up with a tentative date until I get her scheduling sheet from Dr. Amalia Hailey.

## 2017-02-22 NOTE — Telephone Encounter (Signed)
"  I called you on Friday to schedule my surgery and I called you yesterday.  I also called yesterday and you called me.  So, I'd like to schedule my surgery."  Do you have a date in mind?  "I'd like to do it the first week of June."  He can do it on June 7 or 21.  "Let's schedule it for June 21 just to be safe."

## 2017-02-23 MED ORDER — BETAMETHASONE SOD PHOS & ACET 6 (3-3) MG/ML IJ SUSP: 3.0000 mg | Freq: Once | INTRAMUSCULAR | Status: DC

## 2017-03-13 ENCOUNTER — Telehealth: Payer: Self-pay | Admitting: *Deleted

## 2017-03-13 ENCOUNTER — Encounter: Payer: Self-pay | Admitting: *Deleted

## 2017-03-13 NOTE — Telephone Encounter (Signed)
I'm scheduled for surgery on Thursday.  I was reading over my papers and it says to call the doctor if you take any blood thinners for instructions on how to take it.  I take Warfarin.  When should I stop taking it?"  You will need to call your doctor that prescribed it for instructions.  "Okay, thank you so much."

## 2017-03-13 NOTE — Telephone Encounter (Signed)
STOP NOW ,  NO COUMADIN TONIGH.  SHE ALMOST WAITED TOO LATE TO CALL!

## 2017-03-13 NOTE — Telephone Encounter (Signed)
Patient was advised by Dr.Evans from [facility] to stop Warfarin prior to surgery on 03/16/17. Pt requested direction  Pt contact [phone]

## 2017-03-13 NOTE — Telephone Encounter (Signed)
Per last note from Dr. Amalia Hailey pt  Surgery will consist of derotational arthroplasty of the right fifth digit with right 5th MPJ capsulotomy. I have called patient she is scheduled for surgery on this Thursday am. Her last INR was done on 5/24 and next to have labs done on 6/25. Informed Dr. Nicki Reaper is out of the office this week.

## 2017-03-13 NOTE — Telephone Encounter (Signed)
DR. Amalia Hailey should be the one to decide on when to resume her coumadin.  She will not need the INR check on Monday

## 2017-03-13 NOTE — Telephone Encounter (Signed)
Pt informed will hold tonight. When will she need to start back? Does she need to keep her inr app on Monday? Pt informed I will call in the am with further instructions. But she will hold tonight.

## 2017-03-14 NOTE — Telephone Encounter (Signed)
Called patient informed of all information. She has fasting labs Monday as well so she will keep the app. Note has been but in the app to not do INR. She will call our office and let us know what Dr. Amalia Hailey tells her as far as when she starts her medications back. We will find out when we need to schedule INR at that time.

## 2017-03-16 ENCOUNTER — Encounter: Payer: Self-pay | Admitting: Podiatry

## 2017-03-16 DIAGNOSIS — M2012 Hallux valgus (acquired), left foot: Secondary | ICD-10-CM | POA: Diagnosis not present

## 2017-03-16 DIAGNOSIS — M2011 Hallux valgus (acquired), right foot: Secondary | ICD-10-CM | POA: Diagnosis not present

## 2017-03-16 DIAGNOSIS — M2041 Other hammer toe(s) (acquired), right foot: Secondary | ICD-10-CM | POA: Diagnosis not present

## 2017-03-19 ENCOUNTER — Telehealth: Payer: Self-pay | Admitting: Internal Medicine

## 2017-03-19 NOTE — Telephone Encounter (Signed)
Was out of the office 03/10/17 to 03/20/17.  Called pt when reviewed.  She had her surgery 03/16/17.  Did well with surgery.  Was off coumadin from 03/13/17 - 03/16/17.  Back on coumadin now.  Needs a f/u pt/inr checked.  Pt unable to drive.  Her daughter will be taking her for a f/u appt with podiatry Friday 03/24/17.  Will need to come by after her podiatry appt for pt/inr check.  Please schedule for lab appt.  Will be here approximately around 12:00.  Thanks    Dr Nicki Reaper

## 2017-03-20 ENCOUNTER — Other Ambulatory Visit: Payer: Medicare Other

## 2017-03-20 NOTE — Telephone Encounter (Signed)
Lab app has been put in for Friday.

## 2017-03-21 ENCOUNTER — Ambulatory Visit (INDEPENDENT_AMBULATORY_CARE_PROVIDER_SITE_OTHER): Payer: Medicare Other | Admitting: Podiatry

## 2017-03-21 DIAGNOSIS — Z9889 Other specified postprocedural states: Secondary | ICD-10-CM

## 2017-03-21 MED ORDER — DOXYCYCLINE HYCLATE 100 MG PO TABS: 100.0000 mg | ORAL_TABLET | Freq: Two times a day (BID) | ORAL | 0 refills | Status: DC

## 2017-03-21 NOTE — Progress Notes (Signed)
   Subjective:  Patient presents today status post bilateral foot surgery on 03/16/2017. Patient states that she was showering this morning with a shower bag on her right lower extremity and after the shower she noticed that her right lower extremity dressings were very wet. Patient presents today for dressing change due to wet dressing bandages. She's had little to no pain for the past 5 days and is otherwise doing very well.   Objective/Physical Exam Skin incisions appear to be well coapted with sutures and staples intact. No dehiscence. No active bleeding noted. Minimal edema noted to the surgical extremity. There is some localized red erythema noted around the incision site and diffusely around the lateral aspect of the forefoot of the right lower extremity. Left lower extremity bandages are clean dry and intact. We will leave those intact until her follow-up appointment on Friday, 03/24/2017.  Assessment: 1. s/p bilateral surgery lower extremity. DOS: 03/16/2017 2. Mild low-grade cellulitis right forefoot  Plan of Care:  1. Patient was evaluated.  2. Today dressings were changed to the right foot 3. Prescription for doxycycline 100 mg twice a day #24 low-grade cellulitis right forefoot 4. Follow-up on next scheduled appointment Friday, 03/24/2017   Edrick Kins, DPM [facility]  Dr. Edrick Kins, [address]                                        [place], [place] [address]                Office [phone]  Fax [phone]

## 2017-03-24 ENCOUNTER — Encounter: Payer: Self-pay | Admitting: Podiatry

## 2017-03-24 ENCOUNTER — Ambulatory Visit (INDEPENDENT_AMBULATORY_CARE_PROVIDER_SITE_OTHER): Payer: Medicare Other

## 2017-03-24 ENCOUNTER — Other Ambulatory Visit (INDEPENDENT_AMBULATORY_CARE_PROVIDER_SITE_OTHER): Payer: Medicare Other

## 2017-03-24 ENCOUNTER — Ambulatory Visit (INDEPENDENT_AMBULATORY_CARE_PROVIDER_SITE_OTHER): Payer: Self-pay | Admitting: Podiatry

## 2017-03-24 ENCOUNTER — Ambulatory Visit: Payer: Medicare Other

## 2017-03-24 DIAGNOSIS — M2011 Hallux valgus (acquired), right foot: Secondary | ICD-10-CM

## 2017-03-24 DIAGNOSIS — Z9889 Other specified postprocedural states: Secondary | ICD-10-CM

## 2017-03-24 DIAGNOSIS — I4891 Unspecified atrial fibrillation: Secondary | ICD-10-CM | POA: Diagnosis not present

## 2017-03-24 DIAGNOSIS — E7800 Pure hypercholesterolemia, unspecified: Secondary | ICD-10-CM | POA: Diagnosis not present

## 2017-03-24 LAB — LIPID PANEL
Cholesterol: 146 mg/dL (ref 0–200)
HDL: 33.6 mg/dL — AB (ref >39.00)
NONHDL: 111.94
Total CHOL/HDL Ratio: 4
Triglycerides: 204 mg/dL — ABNORMAL HIGH (ref 0.0–149.0)
VLDL: 40.8 mg/dL — AB (ref 0.0–40.0)

## 2017-03-24 LAB — LDL CHOLESTEROL, DIRECT: LDL DIRECT: 75 mg/dL

## 2017-03-24 LAB — PROTIME-INR
INR: 1.9 ratio — AB (ref 0.8–1.0)
PROTHROMBIN TIME: 20.5 s — AB (ref 9.6–13.1)

## 2017-03-24 NOTE — Progress Notes (Signed)
DOS 06.21.2018 Hammertoe repair with metatarsal phalangeal joint capsulotomy fifth toe right foot.

## 2017-03-24 NOTE — Progress Notes (Signed)
   Subjective:  Patient presents today status post bilateral tailor's bunionectomies with a PIPJ derotational arthroplasty of the fifth digit right foot. Overall the patient is doing much better. She is currently taking her doxycycline antibiotic prescribed on last visit. The redness has improved modestly with improved edema also noted.. DOS: 03/16/2018     Objective/Physical Exam Skin incisions appear to be well coapted with sutures and staples intact. Mild localized edema with minimal erythema noted more so on the right foot.  Radiographic Exam:  Orthopedic hardware and osteotomies sites appear to be stable with routine healing.  Assessment: 1. s/p bilateral tailor's bunionectomies with arthroplasty fifth digit right foot. DOS: 03/16/2017   Plan of Care:  1. Patient was evaluated. X-rays reviewed 2. Today dressings were changed. 3. Continue oral antibiotics until they're finished 4. Keep dressings clean dry and intact for 1 additional week. Stay in the postoperative shoe for an additional week 5. Return to clinic in 1 week   Edrick Kins, DPM [facility]  Dr. Edrick Kins, [address]                                        [place], [place] [address]                Office [phone]  Fax [phone]

## 2017-03-26 ENCOUNTER — Other Ambulatory Visit: Payer: Self-pay | Admitting: Internal Medicine

## 2017-03-26 DIAGNOSIS — I4891 Unspecified atrial fibrillation: Secondary | ICD-10-CM

## 2017-03-26 NOTE — Progress Notes (Signed)
Order placed for f/u pt/inr

## 2017-03-31 ENCOUNTER — Ambulatory Visit (INDEPENDENT_AMBULATORY_CARE_PROVIDER_SITE_OTHER): Payer: Self-pay | Admitting: Podiatry

## 2017-03-31 DIAGNOSIS — Z9889 Other specified postprocedural states: Secondary | ICD-10-CM

## 2017-03-31 MED ORDER — OXYCODONE-ACETAMINOPHEN 5-325 MG PO TABS: 1.0000 | ORAL_TABLET | ORAL | 0 refills | Status: DC | PRN

## 2017-04-03 ENCOUNTER — Other Ambulatory Visit (INDEPENDENT_AMBULATORY_CARE_PROVIDER_SITE_OTHER): Payer: Medicare Other

## 2017-04-03 ENCOUNTER — Ambulatory Visit
Admission: RE | Admit: 2017-04-03 | Discharge: 2017-04-03 | Disposition: A | Discharge status: Home / Self Care | Payer: Medicare Other | Source: Ambulatory Visit | Attending: Internal Medicine | Admitting: Internal Medicine

## 2017-04-03 DIAGNOSIS — M810 Age-related osteoporosis without current pathological fracture: Secondary | ICD-10-CM | POA: Diagnosis not present

## 2017-04-03 DIAGNOSIS — E2839 Other primary ovarian failure: Secondary | ICD-10-CM | POA: Diagnosis present

## 2017-04-03 DIAGNOSIS — I4891 Unspecified atrial fibrillation: Secondary | ICD-10-CM | POA: Diagnosis not present

## 2017-04-03 LAB — PROTIME-INR
INR: 2.2 ratio — ABNORMAL HIGH (ref 0.8–1.0)
Prothrombin Time: 23.3 s — ABNORMAL HIGH (ref 9.6–13.1)

## 2017-04-04 NOTE — Progress Notes (Signed)
   Subjective:  Patient presents today status post bilateral tailor's bunionectomies with a PIPJ derotational arthroplasty of the fifth digit right foot. DOS: 03/16/2018. She states she is doing better. She states she feels as if her feet are "drawing up".     Objective/Physical Exam Skin incisions appear to be well coapted with sutures and staples intact. Mild localized edema with minimal erythema noted more so on the right foot.   Assessment: 1. s/p bilateral tailor's bunionectomies with arthroplasty fifth digit right foot. DOS: 03/16/2017 2. Macular degeneration bilaterally   Plan of Care:  1. Patient was evaluated. 2. Today dressings were changed. 3. Refill pain medication.  4. Continue wearing postop shoe. 5. Return to clinic in 2 weeks.   Edrick Kins, DPM [facility]  Dr. Edrick Kins, [address]                                        [place], [place] [address]                Office [phone]  Fax [phone]

## 2017-04-06 ENCOUNTER — Other Ambulatory Visit: Payer: Self-pay

## 2017-04-06 MED ORDER — PROPRANOLOL HCL 10 MG PO TABS: ORAL_TABLET | ORAL | 0 refills | Status: DC

## 2017-04-07 ENCOUNTER — Other Ambulatory Visit: Payer: Self-pay | Admitting: Internal Medicine

## 2017-04-14 ENCOUNTER — Ambulatory Visit: Payer: Medicare Other

## 2017-04-14 ENCOUNTER — Ambulatory Visit (INDEPENDENT_AMBULATORY_CARE_PROVIDER_SITE_OTHER): Payer: Medicare Other | Admitting: Podiatry

## 2017-04-14 ENCOUNTER — Ambulatory Visit (INDEPENDENT_AMBULATORY_CARE_PROVIDER_SITE_OTHER): Payer: Medicare Other

## 2017-04-14 DIAGNOSIS — Z9889 Other specified postprocedural states: Secondary | ICD-10-CM

## 2017-04-22 NOTE — Progress Notes (Signed)
   Subjective:  Patient presents today status post tailor's bunionectomies with arthroplasty of the fifth digit right foot. Date of surgery 03/16/2017. Patient states that she's doing much better. She denies any pain. Patient believes that she is fully healed.   Objective/Physical Exam Skin incisions appear to be well coapted with complete healing. Negative for any pain on palpation or any significant edema to the bilateral feet.   Assessment: 1. s/p bilateral tailor's bunionectomies with arthroplasty fifth digit right foot. DOS: 03/16/2017 2. Macular degeneration bilaterally   Plan of Care:  1. Patient was evaluated. 2. Recommend the patient resume wearing compression hose and good supportive tennis shoes 3. Return to clinic when necessary. Recommend follow up every 3 months for routine foot care   Edrick Kins, DPM [facility]  Dr. Edrick Kins, [address]                                        [place], [place] [address]                Office [phone]  Fax [phone]

## 2017-05-01 ENCOUNTER — Other Ambulatory Visit: Payer: Self-pay | Admitting: Radiology

## 2017-05-01 DIAGNOSIS — Z5181 Encounter for therapeutic drug level monitoring: Secondary | ICD-10-CM

## 2017-05-01 DIAGNOSIS — Z7901 Long term (current) use of anticoagulants: Principal | ICD-10-CM

## 2017-05-02 ENCOUNTER — Other Ambulatory Visit: Payer: Medicare Other

## 2017-05-03 ENCOUNTER — Other Ambulatory Visit: Payer: Self-pay | Admitting: Internal Medicine

## 2017-05-05 ENCOUNTER — Other Ambulatory Visit (INDEPENDENT_AMBULATORY_CARE_PROVIDER_SITE_OTHER): Payer: Medicare Other

## 2017-05-05 DIAGNOSIS — Z5181 Encounter for therapeutic drug level monitoring: Secondary | ICD-10-CM

## 2017-05-05 DIAGNOSIS — Z7901 Long term (current) use of anticoagulants: Secondary | ICD-10-CM | POA: Diagnosis not present

## 2017-05-05 LAB — PROTIME-INR
INR: 3.1 ratio — ABNORMAL HIGH (ref 0.8–1.0)
Prothrombin Time: 32.7 s — ABNORMAL HIGH (ref 9.6–13.1)

## 2017-05-10 ENCOUNTER — Other Ambulatory Visit: Payer: Self-pay | Admitting: Radiology

## 2017-05-10 DIAGNOSIS — Z5181 Encounter for therapeutic drug level monitoring: Secondary | ICD-10-CM

## 2017-05-10 DIAGNOSIS — Z7901 Long term (current) use of anticoagulants: Principal | ICD-10-CM

## 2017-05-11 ENCOUNTER — Other Ambulatory Visit: Payer: Medicare Other

## 2017-05-23 ENCOUNTER — Ambulatory Visit (INDEPENDENT_AMBULATORY_CARE_PROVIDER_SITE_OTHER): Payer: Medicare Other | Admitting: Internal Medicine

## 2017-05-23 ENCOUNTER — Ambulatory Visit
Admission: RE | Admit: 2017-05-23 | Discharge: 2017-05-23 | Disposition: A | Discharge status: Home / Self Care | Payer: Medicare Other | Source: Ambulatory Visit | Attending: Internal Medicine | Admitting: Internal Medicine

## 2017-05-23 ENCOUNTER — Encounter: Payer: Self-pay | Admitting: Internal Medicine

## 2017-05-23 ENCOUNTER — Ambulatory Visit (INDEPENDENT_AMBULATORY_CARE_PROVIDER_SITE_OTHER): Payer: Medicare Other

## 2017-05-23 VITALS — BP 120/80 | HR 80 | Temp 97.7°F | Resp 20 | Ht 67.0 in | Wt 182.2 lb

## 2017-05-23 DIAGNOSIS — G4733 Obstructive sleep apnea (adult) (pediatric): Secondary | ICD-10-CM | POA: Diagnosis not present

## 2017-05-23 DIAGNOSIS — Z23 Encounter for immunization: Secondary | ICD-10-CM | POA: Diagnosis not present

## 2017-05-23 DIAGNOSIS — R739 Hyperglycemia, unspecified: Secondary | ICD-10-CM | POA: Diagnosis not present

## 2017-05-23 DIAGNOSIS — M79604 Pain in right leg: Secondary | ICD-10-CM | POA: Insufficient documentation

## 2017-05-23 DIAGNOSIS — I4891 Unspecified atrial fibrillation: Secondary | ICD-10-CM

## 2017-05-23 DIAGNOSIS — M791 Myalgia, unspecified site: Secondary | ICD-10-CM

## 2017-05-23 DIAGNOSIS — E7800 Pure hypercholesterolemia, unspecified: Secondary | ICD-10-CM | POA: Diagnosis not present

## 2017-05-23 DIAGNOSIS — M25552 Pain in left hip: Secondary | ICD-10-CM

## 2017-05-23 DIAGNOSIS — M25561 Pain in right knee: Secondary | ICD-10-CM

## 2017-05-23 DIAGNOSIS — D0360 Melanoma in situ of unspecified upper limb, including shoulder: Secondary | ICD-10-CM

## 2017-05-23 DIAGNOSIS — M2550 Pain in unspecified joint: Secondary | ICD-10-CM

## 2017-05-23 DIAGNOSIS — K219 Gastro-esophageal reflux disease without esophagitis: Secondary | ICD-10-CM

## 2017-05-23 DIAGNOSIS — C50919 Malignant neoplasm of unspecified site of unspecified female breast: Secondary | ICD-10-CM

## 2017-05-23 DIAGNOSIS — C73 Malignant neoplasm of thyroid gland: Secondary | ICD-10-CM | POA: Diagnosis not present

## 2017-05-23 DIAGNOSIS — M7989 Other specified soft tissue disorders: Secondary | ICD-10-CM

## 2017-05-23 LAB — BASIC METABOLIC PANEL
BUN: 11 mg/dL (ref 6–23)
CHLORIDE: 100 meq/L (ref 96–112)
CO2: 34 meq/L — AB (ref 19–32)
CREATININE: 0.84 mg/dL (ref 0.40–1.20)
Calcium: 10 mg/dL (ref 8.4–10.5)
GFR: 68.67 mL/min (ref >60.00)
Glucose, Bld: 110 mg/dL — ABNORMAL HIGH (ref 70–99)
Potassium: 4.1 mEq/L (ref 3.5–5.1)
SODIUM: 139 meq/L (ref 135–145)

## 2017-05-23 LAB — CK: CK TOTAL: 46 U/L (ref 7–177)

## 2017-05-23 LAB — SEDIMENTATION RATE: SED RATE: 24 mm/h (ref 0–30)

## 2017-05-23 LAB — PROTIME-INR
INR: 2.9 ratio — AB (ref 0.8–1.0)
PROTHROMBIN TIME: 31.2 s — AB (ref 9.6–13.1)

## 2017-05-23 NOTE — Progress Notes (Signed)
Patient ID: Kerri Mills, female   DOB: 1930/10/11, 83 y.o.   MRN: [id number]   Subjective:    Patient ID: Kerri Mills, female    DOB: November 12, 1930, 83 y.o.   MRN: [id number]  HPI  Patient here for a scheduled follow up.  She has several concerns. She is concerned that her right leg has become bigger.  Has had persistent increased size in left, but recently increased size - right. She reports some increased pain - right knee.  Some sharp pain intermittent - posterior.  Also reports some left hip pain - occurs with walking.  Increased joint aches.  Soreness.  This gets some better with movement.  No rash.  No fever.  No headaches.  Does report some fullness in her left ear.  Some possible decreased hearing.  No chest pain.  No sob.  No acid reflux.  No abdominal pain.  Bowels moving.      Past Medical History:  Diagnosis Date  . Allergy   . Anxiety   . Arthritis   . Breast CA ([place])   . Breast cancer ([place]) 2011   LT LUMPECTOMY  . Colitis   . Depression   . Diverticulitis 2013  . Gastric ulcer   . GERD (gastroesophageal reflux disease)   . Heart disease   . Hypercholesterolemia   . Hypertension   . Infiltrating lobular carcinoma of left breast 2011   T2,N0, ER: 90%; PR 0%; Her 2 neu not amplified. [facility]).  . Melanoma ([place]) 1997  . Melanoma in situ of upper extremity ([place]) 03/19/2011  . Personal history of radiation therapy 2011   BREAST CA  . Seroma    HISTORY OF LFT BREAST  . Sleep apnea   . Thyroid cancer ([place]) 03/19/2011   Past Surgical History:  Procedure Laterality Date  . ABDOMINAL HYSTERECTOMY  1973   partial  . BREAST BIOPSY Left 02-13-13   BENIGN BREAST TISSUE WITH CHANGES CONSISTENT WITH FAT NECROSIS  . BREAST BIOPSY Left 01/21/2015   bx done in [facility] office 11:00 left 6-8cmfn  . BREAST EXCISIONAL BIOPSY Left 1995   neg  . BREAST EXCISIONAL BIOPSY Left 2011   Breast cancer radiation  . BREAST LUMPECTOMY Left 2011   BREAST CA  . CHOLECYSTECTOMY    .  COLONOSCOPY  2013  . MELANOMA EXCISION     RT UPPER ARM  . PARTIAL HYSTERECTOMY     bleeding, ovaries in place.    . THYROID SURGERY     FOR THYROID CANCER  . TONSILLECTOMY     Family History  Problem Relation Age of Onset  . Heart disease Mother   . Cancer Brother        lung   . Breast cancer Neg Hx    Social History   Social History  . Marital status: Widowed    Spouse name: N/A  . Number of children: N/A  . Years of education: N/A   Social History Main Topics  . Smoking status: Never Smoker  . Smokeless tobacco: Never Used  . Alcohol use 0.0 oz/week     Comment: social drinking. average times a week  . Drug use: No  . Sexual activity: No   Other Topics Concern  . None   Social History Narrative  . None    Outpatient Encounter Prescriptions as of 05/23/2017  Medication Sig  . albuterol (PROAIR HFA) 108 (90 Base) MCG/ACT inhaler Inhale 2 puffs into the lungs every 6 (six)  hours as needed for wheezing or shortness of breath.  . Cholecalciferol (VITAMIN D) 1000 UNITS capsule Take 1,000 Units by mouth daily.    . DULoxetine (CYMBALTA) 60 MG capsule TAKE 1 CAPSULE (60 MG TOTAL) BY MOUTH AT BEDTIME.  Marland Kitchen gabapentin (NEURONTIN) 300 MG capsule TAKE 2 CAPSULES (600 MG TOTAL) BY MOUTH AT BEDTIME.  Marland Kitchen levothyroxine (SYNTHROID, LEVOTHROID) 88 MCG tablet TAKE ONE TABLET BY MOUTH ONCE DAILY BEFORE BREAKFAST  . losartan-hydrochlorothiazide (HYZAAR) 100-12.5 MG tablet TAKE 1 TABLET BY MOUTH DAILY.  Marland Kitchen lovastatin (MEVACOR) 40 MG tablet TAKE 1 TABLET (40 MG TOTAL) BY MOUTH DAILY.  . Multiple Vitamins-Minerals (PRESERVISION AREDS 2 PO) Take 2 tablets by mouth daily.    Marland Kitchen omeprazole (PRILOSEC) 20 MG capsule TAKE 1 CAPSULE (20 MG TOTAL) BY MOUTH DAILY.  Marland Kitchen propranolol (INDERAL) 10 MG tablet TAKE 1 TABLET (10 MG TOTAL) BY MOUTH DAILY AS NEEDED.  Marland Kitchen warfarin (COUMADIN) 3 MG tablet Take 1 and 1/2 tablet on Wednesday and one tablet all other days of the week. (Patient taking differently: Take 1  and 1/2 (4.60m) tablet on Wednesday & Sunday and 1 (381m tablet all other days of the week.)  . [DISCONTINUED] fluticasone (FLONASE) 50 MCG/ACT nasal spray Place 2 sprays into both nostrils daily.  . [DISCONTINUED] meloxicam (MOBIC) 15 MG tablet Take 15 mg by mouth daily.  . propranolol (INDERAL) 10 MG tablet TAKE 1 TABLET (10 MG TOTAL) BY MOUTH DAILY AS NEEDED. (Patient not taking: Reported on 05/23/2017)  . [DISCONTINUED] doxycycline (VIBRA-TABS) 100 MG tablet Take 1 tablet (100 mg total) by mouth 2 (two) times daily.  . [DISCONTINUED] oxyCODONE-acetaminophen (PERCOCET/ROXICET) 5-325 MG tablet Take 1 tablet by mouth every 4 (four) hours as needed for severe pain (Take 1 tablet by mouth every four hours as needed for pain).  . [DISCONTINUED] predniSONE (DELTASONE) 10 MG tablet Take 6 tablets x 1 day and then decrease by 1/2 tablet per day until down to zero mg.  . [DISCONTINUED] warfarin (COUMADIN) 3 MG tablet TAKE 1 TABLET (3 MG TOTAL) BY MOUTH DAILY.   Facility-Administered Encounter Medications as of 05/23/2017  Medication  . betamethasone acetate-betamethasone sodium phosphate (CELESTONE) injection 3 mg  . betamethasone acetate-betamethasone sodium phosphate (CELESTONE) injection 3 mg    Review of Systems  Constitutional: Negative for appetite change and unexpected weight change.  HENT: Negative for sinus pressure.        Left ear fullness as outlined.    Respiratory: Negative for cough, chest tightness and shortness of breath.   Cardiovascular: Negative for chest pain and palpitations.       Has noticed right leg size has increased.    Gastrointestinal: Negative for abdominal pain, diarrhea, nausea and vomiting.  Genitourinary: Negative for difficulty urinating and dysuria.  Musculoskeletal:       Low back pain, left hip pain and right knee pain.  Joint aches.  Tries to stay active.    Skin: Negative for color change and rash.  Neurological: Negative for dizziness, light-headedness and  headaches.  Psychiatric/Behavioral: Negative for agitation and dysphoric mood.       Objective:    Physical Exam  Constitutional: She appears well-developed and well-nourished. No distress.  HENT:  Nose: Nose normal.  Mouth/Throat: Oropharynx is clear and moist.  Left ear- no cerumen impaction.  No increased erythema.   Neck: Neck supple. No thyromegaly present.  Cardiovascular: Normal rate and regular rhythm.   Pulmonary/Chest: Breath sounds normal. No respiratory distress. She has no wheezes.  Abdominal:  Soft. Bowel sounds are normal. There is no tenderness.  Musculoskeletal: She exhibits no edema or tenderness.  Increased pain in lower back - with SLR.  No increased pain with abduction and adduction - no pain. Increased pain with pain - palpation knee.  Increased soft tissue.    Lymphadenopathy:    She has no cervical adenopathy.  Skin: No rash noted. No erythema.  Psychiatric: She has a normal mood and affect. Her behavior is normal.    BP 120/80 (BP Location: Left Arm, Patient Position: Sitting, Cuff Size: Normal)   Pulse 80   Temp 97.7 F (36.5 C) (Oral)   Resp 20   Ht _0  (1.702 m)   Wt 182 lb 3.2 oz (82.6 kg)   SpO2 97%   BMI 28.54 kg/m  Wt Readings from Last 3 Encounters:  05/23/17 182 lb 3.2 oz (82.6 kg)  01/27/17 185 lb (83.9 kg)  01/24/17 182 lb (82.6 kg)     Lab Results  Component Value Date   WBC 5.8 06/20/2016   HGB 12.2 06/20/2016   HCT 36.5 06/20/2016   PLT 247.0 06/20/2016   GLUCOSE 110 (H) 05/23/2017   CHOL 146 03/24/2017   TRIG 204.0 (H) 03/24/2017   HDL 33.60 (L) 03/24/2017   LDLDIRECT 75.0 03/24/2017   LDLCALC 91 06/20/2016   ALT 13 02/16/2017   AST 16 02/16/2017   NA 139 05/23/2017   K 4.1 05/23/2017   CL 100 05/23/2017   CREATININE 0.84 05/23/2017   BUN 11 05/23/2017   CO2 34 (H) 05/23/2017   TSH 1.30 02/16/2017   INR 2.9 (H) 05/23/2017   HGBA1C 6.5 02/16/2017    Dg Bone Density  Result Date: 04/03/2017 EXAM: DUAL X-RAY  ABSORPTIOMETRY (DXA) FOR BONE MINERAL DENSITY IMPRESSION: Dear Dr. Einar Pheasant, Your patient Kerri Mills completed a BMD test on 04/03/2017 using the [place] (analysis version: 14.10) manufactured by [employer]. The following summarizes the results of our evaluation. PATIENT BIOGRAPHICAL: Name: Kerri, Mills Patient ID: [id number] Birth Date: 1930-12-27 Height: 64.5 in. Gender: Female Exam Date: 04/03/2017 Weight: 184.4 lbs. Indications: A. Fib, Advanced Age, Caucasian, Height Loss, History of Breast Cancer, History of Fracture (Adult), History of Radiation, History of Thyroid removal, Hysterectomy, Osteoarthritis, Postmenopausal, Vitamin D Deficiency Fractures: patella, Right wrist Treatments: Albuterol, Cymbalta, Flonase, gabapentin, LEVOTHYROXINE, mobic, omeprazole, prednisone, Vitamin D, warfarin ASSESSMENT: The BMD measured at Forearm Radius 33% is 0.593 g/cm2 with a T-score of -3.2. This patient is considered OSTEOPOROTIC according to [facility]) criteria. Lumbar spine was not utilized due to advanced degenerative changes. Patient is not a candidate for FRAX due to osteoporotic measurement of forearm. Site Region Measured Measured WHO Young Adult BMD Date       Age      Classification T-score DualFemur Neck Right 04/03/2017 83.7 Osteopenia -1.9 0.771 g/cm2 Left Forearm Radius 33% 04/03/2017 83.7 Osteoporosis -3.2 0.593 g/cm2 World Health Organization [facility]) criteria for post-menopausal, Caucasian Women: Normal:       T-score at or above -1 SD Osteopenia:   T-score between -1 and -2.5 SD Osteoporosis: T-score at or below -2.5 SD RECOMMENDATIONS: [place] recommends that FDA-approved medical therapies be considered in postmenopausal women and men age 17 or older with a: 1. Hip or vertebral (clinical or morphometric) fracture. 2. T-score of < -2.5 at the spine or hip. 3. Ten-year fracture probability by FRAX of 3% or greater for hip fracture or 20% or  greater for major osteoporotic fracture.  All treatment decisions require clinical judgment and consideration of individual patient factors, including patient preferences, co-morbidities, previous drug use, risk factors not captured in the FRAX model (e.g. falls, vitamin D deficiency, increased bone turnover, interval significant decline in bone density) and possible under - or over-estimation of fracture risk by FRAX. All patients should ensure an adequate intake of dietary calcium (1200 mg/d) and vitamin D (800 IU daily) unless contraindicated. FOLLOW-UP: People with diagnosed cases of osteoporosis or at high risk for fracture should have regular bone mineral density tests. For patients eligible for Medicare, routine testing is allowed once every 2 years. The testing frequency can be increased to one year for patients who have rapidly progressing disease, those who are receiving or discontinuing medical therapy to restore bone mass, or have additional risk factors. I have reviewed this report, and agree with the above findings. Mark A. Thornton Papas, M.D. [facility] Radiology Electronically Signed   By: Lavonia Dana M.D.   On: 04/03/2017 15:35       Assessment & Plan:   Problem List Items Addressed This Visit    Atrial fibrillation ([place])    On coumadin.  Recheck pt/inr today.        Relevant Orders   Protime-INR (Completed)   GERD (gastroesophageal reflux disease)    Controlled on omeprazole.        Hypercholesterolemia    On lovastatin.  Low cholesterol diet and exercise.  Follow lipid panel and liver function tests.        Hyperglycemia    Low carb diet and exercise.  Follow met b and a1c.        Relevant Orders   Basic metabolic panel (Completed)   Infiltrating lobular carcinoma of left breast    Saw Dr Bary Castilla 01/25/17.  Recommended f/u in one year.        Joint ache    Increased joint aches as outlined.  Unclear etiology.  On lovastatin.  Has been on for years.  Check xrays as outlined.   Check ck, esr.  Further w/up pending results.        Melanoma in situ of upper extremity ([place])    Followed by dermatology.        Obstructive sleep apnea    CPAP.       Right leg swelling    Right leg larger than previous. No increased edema.  No increased erythema.  Will check xray of knee.  Also obtain lower extremity ultrasound.  Has a h/o DVT.  Further w/up pending results.        Thyroid cancer ([place])    On thyroid replacement.  Follow tsh.  S/p XRT postop.        Other Visit Diagnoses    Left hip pain    -  Primary   Relevant Orders   DG HIP UNILAT W OR W/O PELVIS 2-3 VIEWS LEFT (Completed)   Right knee pain, unspecified chronicity       Relevant Orders   DG Knee 1-2 Views Right (Completed)   Muscle ache       Relevant Orders   Sedimentation rate (Completed)   CK (Creatine Kinase) (Completed)   Right leg pain       Relevant Orders   US Venous Img Lower Unilateral Right (Completed)   Encounter for immunization       Relevant Orders   Flu vaccine HIGH DOSE PF (Completed)       Einar Pheasant, MD

## 2017-05-23 NOTE — Progress Notes (Signed)
Pre-visit discussion using our clinic review tool. No additional management support is needed unless otherwise documented below in the visit note.  

## 2017-05-24 ENCOUNTER — Other Ambulatory Visit: Payer: Self-pay | Admitting: Family Medicine

## 2017-05-25 ENCOUNTER — Other Ambulatory Visit: Payer: Self-pay | Admitting: Internal Medicine

## 2017-05-25 ENCOUNTER — Encounter: Payer: Self-pay | Admitting: Internal Medicine

## 2017-05-25 DIAGNOSIS — M7989 Other specified soft tissue disorders: Secondary | ICD-10-CM | POA: Insufficient documentation

## 2017-05-25 DIAGNOSIS — M2550 Pain in unspecified joint: Secondary | ICD-10-CM | POA: Insufficient documentation

## 2017-05-25 NOTE — Assessment & Plan Note (Signed)
Low carb diet and exercise.  Follow met b and a1c.   

## 2017-05-25 NOTE — Assessment & Plan Note (Signed)
Increased joint aches as outlined.  Unclear etiology.  On lovastatin.  Has been on for years.  Check xrays as outlined.  Check ck, esr.  Further w/up pending results.

## 2017-05-25 NOTE — Assessment & Plan Note (Signed)
On coumadin.  Recheck pt/inr today.  

## 2017-05-25 NOTE — Progress Notes (Signed)
Order placed for rheumatology referral.  

## 2017-05-25 NOTE — Assessment & Plan Note (Signed)
On thyroid replacement.  Follow tsh.  S/p XRT postop.

## 2017-05-25 NOTE — Assessment & Plan Note (Signed)
Controlled on omeprazole.   

## 2017-05-25 NOTE — Assessment & Plan Note (Signed)
Right leg larger than previous. No increased edema.  No increased erythema.  Will check xray of knee.  Also obtain lower extremity ultrasound.  Has a h/o DVT.  Further w/up pending results.

## 2017-05-25 NOTE — Assessment & Plan Note (Signed)
CPAP.  

## 2017-05-25 NOTE — Assessment & Plan Note (Signed)
Followed by dermatology

## 2017-05-25 NOTE — Assessment & Plan Note (Signed)
On lovastatin.  Low cholesterol diet and exercise.  Follow lipid panel and liver function tests.   

## 2017-05-25 NOTE — Assessment & Plan Note (Signed)
Saw Dr Bary Castilla 01/25/17.  Recommended f/u in one year.

## 2017-06-08 ENCOUNTER — Other Ambulatory Visit: Payer: Self-pay | Admitting: Internal Medicine

## 2017-06-08 ENCOUNTER — Telehealth: Payer: Self-pay | Admitting: *Deleted

## 2017-06-08 NOTE — Telephone Encounter (Signed)
Patient called because she is trying to get life insurance and the insurance guy is saying that she has cancer. She wants to talk to you about this an maybe get a letter from [place] stating that she does not have cancer.

## 2017-06-12 DIAGNOSIS — M810 Age-related osteoporosis without current pathological fracture: Secondary | ICD-10-CM | POA: Insufficient documentation

## 2017-06-12 DIAGNOSIS — M170 Bilateral primary osteoarthritis of knee: Secondary | ICD-10-CM | POA: Insufficient documentation

## 2017-06-12 NOTE — Telephone Encounter (Signed)
PATIENT CALLED IN CHECKING TO SEE IF THE NURSE WAS GOING TO RETURN HER CALL TODAY. I EXPLAINED SINCE THE MESSAGE WAS TAKEN MARSHA OUR NURSE WAS NOT HERE.PATIENT WOULD LIKE A CALL BACK Tuesday 06-13-17.

## 2017-06-13 ENCOUNTER — Encounter: Payer: Self-pay | Admitting: *Deleted

## 2017-06-13 NOTE — Telephone Encounter (Signed)
She states she will call her insurance agent and find out what they need for her to be able to get coverage. She thinks they might need a letter stateing she is not actively being treated for cancer but she is not sure.

## 2017-06-14 ENCOUNTER — Other Ambulatory Visit (INDEPENDENT_AMBULATORY_CARE_PROVIDER_SITE_OTHER): Payer: Medicare Other

## 2017-06-14 DIAGNOSIS — Z5181 Encounter for therapeutic drug level monitoring: Secondary | ICD-10-CM

## 2017-06-14 DIAGNOSIS — Z7901 Long term (current) use of anticoagulants: Secondary | ICD-10-CM

## 2017-06-14 LAB — PROTIME-INR
INR: 1.7 ratio — AB (ref 0.8–1.0)
Prothrombin Time: 18.5 s — ABNORMAL HIGH (ref 9.6–13.1)

## 2017-06-15 ENCOUNTER — Telehealth: Payer: Self-pay | Admitting: Radiology

## 2017-06-15 NOTE — Telephone Encounter (Signed)
Pt coming in for labs next Tuesday, please place future orders. Thank you.  

## 2017-06-16 ENCOUNTER — Other Ambulatory Visit: Payer: Self-pay | Admitting: Internal Medicine

## 2017-06-16 DIAGNOSIS — Z7901 Long term (current) use of anticoagulants: Secondary | ICD-10-CM

## 2017-06-16 NOTE — Progress Notes (Signed)
Order placed for f/u pt/inr 

## 2017-06-16 NOTE — Telephone Encounter (Signed)
Order placed for f/u pt/inr 

## 2017-06-20 ENCOUNTER — Other Ambulatory Visit (INDEPENDENT_AMBULATORY_CARE_PROVIDER_SITE_OTHER): Payer: Medicare Other

## 2017-06-20 DIAGNOSIS — Z7901 Long term (current) use of anticoagulants: Secondary | ICD-10-CM

## 2017-06-20 DIAGNOSIS — Z5181 Encounter for therapeutic drug level monitoring: Secondary | ICD-10-CM | POA: Diagnosis not present

## 2017-06-20 LAB — PROTIME-INR
INR: 2.4 ratio — ABNORMAL HIGH (ref 0.8–1.0)
Prothrombin Time: 26.2 s — ABNORMAL HIGH (ref 9.6–13.1)

## 2017-06-22 ENCOUNTER — Ambulatory Visit (INDEPENDENT_AMBULATORY_CARE_PROVIDER_SITE_OTHER): Payer: Medicare Other | Admitting: Podiatry

## 2017-06-22 ENCOUNTER — Encounter: Payer: Self-pay | Admitting: Podiatry

## 2017-06-22 ENCOUNTER — Ambulatory Visit: Payer: Medicare Other | Admitting: Podiatry

## 2017-06-22 DIAGNOSIS — Z9889 Other specified postprocedural states: Secondary | ICD-10-CM | POA: Diagnosis not present

## 2017-06-22 NOTE — Progress Notes (Addendum)
This patient returns to the office following foot surgery on both feet.  She says that her surgery was on June 21 and she had bunionectomies  performed on her fifth metatarsals as well as an arthroplasty of the fifth digit right foot.  She says she had a painful area on the fifth toe of the right foot which she worked on herself and proceeded to cut herself.  No evidence of any infection is noted.  Patient is very concerned about her second toes on both feet.  She gives a history of previous digital surgery on multiple toes.  She says had her hammertoes are causing significant pain and discomfort and wishes she had had them surgically corrected previously by Dr. Amalia Hailey.    Neurovascular status intact  B/L.  normal healing noted at the osteotomy sites on both feet.  Fifth toe right foot  have healed .  Patient has significant hammertoe deformities of the second digits bilaterally.   Hammer toes  Second  B/L  Pincer hallux nails  B/L  ROV.  Examination of the fifth toe which she cut reveals healing at the site of the injury.  The surgeries that were performed  have healed uneventfully.  Patient is concerned about her second digits and expresses desire to have them surgically corrected.  I recommended that she make an appointment with Dr. Amalia Hailey for continued evaluation of these toes. Debridement of pincer nails. RTC 3 months for preventative foot care services.    Gardiner Barefoot DPM

## 2017-06-22 NOTE — Addendum Note (Signed)
Addended by: Gardiner Barefoot on: 06/22/2017 02:41 PM   Modules accepted: Level of Service

## 2017-06-25 ENCOUNTER — Other Ambulatory Visit: Payer: Self-pay | Admitting: Internal Medicine

## 2017-06-29 ENCOUNTER — Other Ambulatory Visit: Payer: Self-pay | Admitting: Radiology

## 2017-06-29 DIAGNOSIS — Z7901 Long term (current) use of anticoagulants: Principal | ICD-10-CM

## 2017-06-29 DIAGNOSIS — Z5181 Encounter for therapeutic drug level monitoring: Secondary | ICD-10-CM

## 2017-06-30 ENCOUNTER — Other Ambulatory Visit (INDEPENDENT_AMBULATORY_CARE_PROVIDER_SITE_OTHER): Payer: Medicare Other

## 2017-06-30 ENCOUNTER — Telehealth: Payer: Self-pay | Admitting: Radiology

## 2017-06-30 ENCOUNTER — Other Ambulatory Visit: Payer: Self-pay | Admitting: Internal Medicine

## 2017-06-30 DIAGNOSIS — R739 Hyperglycemia, unspecified: Secondary | ICD-10-CM

## 2017-06-30 DIAGNOSIS — Z5181 Encounter for therapeutic drug level monitoring: Secondary | ICD-10-CM | POA: Diagnosis not present

## 2017-06-30 DIAGNOSIS — Z7901 Long term (current) use of anticoagulants: Secondary | ICD-10-CM | POA: Diagnosis not present

## 2017-06-30 LAB — PROTIME-INR
INR: 2.9 ratio — ABNORMAL HIGH (ref 0.8–1.0)
Prothrombin Time: 31.4 s — ABNORMAL HIGH (ref 9.6–13.1)

## 2017-06-30 LAB — HEMOGLOBIN A1C: Hgb A1c MFr Bld: 6.6 % — ABNORMAL HIGH (ref 4.6–6.5)

## 2017-06-30 NOTE — Addendum Note (Signed)
Addended by: Arby Barrette on: 06/30/2017 01:54 PM   Modules accepted: Orders

## 2017-06-30 NOTE — Telephone Encounter (Signed)
Pt came in for INR. Pt last A1C was drawn in May. Purple tube was drawn incase you would A1C. Would you like A1C today?

## 2017-06-30 NOTE — Progress Notes (Signed)
Order placed for a1c

## 2017-06-30 NOTE — Telephone Encounter (Signed)
I have placed the order for the a1c.  Please add.  Thanks

## 2017-06-30 NOTE — Telephone Encounter (Signed)
Ok, pt A1C has been added. Thank you.

## 2017-07-03 ENCOUNTER — Telehealth: Payer: Self-pay | Admitting: Internal Medicine

## 2017-07-03 ENCOUNTER — Telehealth: Payer: Self-pay | Admitting: Family

## 2017-07-03 ENCOUNTER — Ambulatory Visit (INDEPENDENT_AMBULATORY_CARE_PROVIDER_SITE_OTHER): Payer: Medicare Other

## 2017-07-03 VITALS — BP 122/70 | HR 78 | Temp 98.3°F | Resp 14 | Ht 65.0 in | Wt 183.0 lb

## 2017-07-03 DIAGNOSIS — Z0000 Encounter for general adult medical examination without abnormal findings: Secondary | ICD-10-CM | POA: Diagnosis not present

## 2017-07-03 NOTE — Telephone Encounter (Signed)
Pt called back returning your call. Pt states that she spoke with Denisa in regards to results.

## 2017-07-03 NOTE — Patient Instructions (Addendum)
  Kerri Mills , Thank you for taking time to come for your Medicare Wellness Visit. I appreciate your ongoing commitment to your health goals. Please review the following plan we discussed and let me know if I can assist you in the future.   Follow up with Dr. Nicki Reaper as needed.    Bring a copy of your [place] and/or Living Will to be scanned into chart.  Have a great day!  These are the goals we discussed: Goals    . Increase physical activity          Maintain exercise regimen of walking    . Increase water intake          Stay hydrated and drink plenty of water.         This is a list of the screening recommended for you and due dates:  Health Maintenance  Topic Date Due  . Tetanus Vaccine  06/26/1950  . Mammogram  01/13/2018  . Flu Shot  Completed  . DEXA scan (bone density measurement)  Completed  . Pneumonia vaccines  Completed

## 2017-07-03 NOTE — Telephone Encounter (Signed)
Denisa,  Read your note Regarding patient's depression- was she okay with waiting until Nov?  I didn't see any notes regarding suicide ideation. Would you ensure she is stable and can wait until Nov?

## 2017-07-03 NOTE — Progress Notes (Signed)
Subjective:   Kerri Mills is a 83 y.o. female who presents for Medicare Annual (Subsequent) preventive examination.  Review of Systems:  No ROS.  Medicare Wellness Visit. Additional risk factors are reflected in the social history. Cardiac Risk Factors include: advanced age (>70men, >69 women)     Objective:     Vitals: BP 122/70 (BP Location: Left Arm, Patient Position: Sitting, Cuff Size: Normal)   Pulse 78   Temp 98.3 F (36.8 C) (Oral)   Resp 14   Ht 5\' 5"  (1.651 m)   Wt 183 lb (83 kg)   SpO2 97%   BMI 30.45 kg/m   Body mass index is 30.45 kg/m.   Tobacco History  Smoking Status  . Never Smoker  Smokeless Tobacco  . Never Used     Counseling given: Not Answered   Past Medical History:  Diagnosis Date  . Allergy   . Anxiety   . Arthritis   . Breast CA ([place])   . Breast cancer ([place]) 2011   LT LUMPECTOMY  . Colitis   . Depression   . Diverticulitis 2013  . Gastric ulcer   . GERD (gastroesophageal reflux disease)   . Heart disease   . Hypercholesterolemia   . Hypertension   . Infiltrating lobular carcinoma of left breast 2011   T2,N0, ER: 90%; PR 0%; Her 2 neu not amplified. [facility]).  . Melanoma ([place]) 1997  . Melanoma in situ of upper extremity ([place]) 03/19/2011  . Personal history of radiation therapy 2011   BREAST CA  . Seroma    HISTORY OF LFT BREAST  . Sleep apnea   . Thyroid cancer ([place]) 1992   Past Surgical History:  Procedure Laterality Date  . ABDOMINAL HYSTERECTOMY  1973   partial  . BREAST BIOPSY Left 02-13-13   BENIGN BREAST TISSUE WITH CHANGES CONSISTENT WITH FAT NECROSIS  . BREAST BIOPSY Left 01/21/2015   bx done in [facility] 11:00 left 6-8cmfn  . BREAST EXCISIONAL BIOPSY Left 1995   neg  . BREAST EXCISIONAL BIOPSY Left 2011   Breast cancer radiation  . BREAST LUMPECTOMY Left 2011   BREAST CA  . CHOLECYSTECTOMY    . COLONOSCOPY  2013  . MELANOMA EXCISION     RT UPPER ARM  . PARTIAL HYSTERECTOMY     bleeding, ovaries in place.    . THYROID SURGERY  1992   FOR THYROID CANCER  . TONSILLECTOMY     Family History  Problem Relation Age of Onset  . Heart disease Mother   . Cancer Brother        lung   . Cancer Sister        breast  . Breast cancer Neg Hx    History  Sexual Activity  . Sexual activity: No    Outpatient Encounter Prescriptions as of 07/03/2017  Medication Sig  . albuterol (PROAIR HFA) 108 (90 Base) MCG/ACT inhaler Inhale 2 puffs into the lungs every 6 (six) hours as needed for wheezing or shortness of breath.  . Cholecalciferol (VITAMIN D) 1000 UNITS capsule Take 1,000 Units by mouth daily.    . DULoxetine (CYMBALTA) 60 MG capsule TAKE 1 CAPSULE (60 MG TOTAL) BY MOUTH AT BEDTIME.  . fluticasone (FLONASE) 50 MCG/ACT nasal spray PLACE 2 SPRAYS INTO BOTH NOSTRILS DAILY.  Marland Kitchen gabapentin (NEURONTIN) 300 MG capsule TAKE 2 CAPSULES (600 MG TOTAL) BY MOUTH AT BEDTIME.  Marland Kitchen levothyroxine (SYNTHROID, LEVOTHROID) 88 MCG tablet TAKE ONE TABLET BY MOUTH  ONCE DAILY BEFORE BREAKFAST  . losartan-hydrochlorothiazide (HYZAAR) 100-12.5 MG tablet TAKE 1 TABLET BY MOUTH DAILY.  Marland Kitchen lovastatin (MEVACOR) 40 MG tablet TAKE 1 TABLET (40 MG TOTAL) BY MOUTH DAILY.  . Multiple Vitamins-Minerals (PRESERVISION AREDS 2 PO) Take 2 tablets by mouth daily.    Marland Kitchen omeprazole (PRILOSEC) 20 MG capsule TAKE 1 CAPSULE (20 MG TOTAL) BY MOUTH DAILY.  Marland Kitchen propranolol (INDERAL) 10 MG tablet TAKE 1 TABLET (10 MG TOTAL) BY MOUTH DAILY AS NEEDED.  Marland Kitchen warfarin (COUMADIN) 3 MG tablet Take 1 and 1/2 tablet on Wednesday and one tablet all other days of the week. (Patient taking differently: Take 1 and 1/2 (4.5mg ) tablet on Wednesday & Sunday and 1 (3mg ) tablet all other days of the week.)  . [DISCONTINUED] propranolol (INDERAL) 10 MG tablet TAKE 1 TABLET (10 MG TOTAL) BY MOUTH DAILY AS NEEDED.   Facility-Administered Encounter Medications as of 07/03/2017  Medication  . betamethasone acetate-betamethasone sodium phosphate  (CELESTONE) injection 3 mg  . betamethasone acetate-betamethasone sodium phosphate (CELESTONE) injection 3 mg    Activities of Daily Living In your present state of health, do you have any difficulty performing the following activities: 07/03/2017  Hearing? N  Vision? N  Difficulty concentrating or making decisions? N  Walking or climbing stairs? N  Dressing or bathing? N  Doing errands, shopping? N  Preparing Food and eating ? N  Using the Toilet? N  In the past six months, have you accidently leaked urine? N  Do you have problems with loss of bowel control? N  Managing your Medications? N  Managing your Finances? N  Housekeeping or managing your Housekeeping? N  Some recent data might be hidden    Patient Care Team: Einar Pheasant, MD as PCP - General (Internal Medicine) Bary Castilla, Forest Gleason, MD (General Surgery) Rocco Serene, MD (Internal Medicine)    Assessment:    This is a routine wellness examination for Kerri Mills. The goal of the wellness visit is to assist the patient how to close the gaps in care and create a preventative care plan for the patient.   The roster of all physicians providing medical care to patient is listed in the Snapshot section of the chart.  Taking calcium VIT D as appropriate/Osteoporosis risk reviewed.    Safety issues reviewed; Smoke and carbon monoxide detectors in the home. No firearms in the home.  Wears seatbelts when driving or riding with others. Patient does wear sunscreen or protective clothing when in direct sunlight. No violence in the home.  Patient is alert, normal appearance, oriented to person/place/and time.  Correctly identified the president of the [place], recall of 3/3 words, and performing simple calculations. Displays appropriate judgement and can read correct time from watch face.   No new identified risk were noted.  No failures at ADL's or IADL's.    BMI- discussed the importance of a healthy diet, water intake and the  benefits of aerobic exercise. Educational material provided.   24 hour diet recall: Low carb foods  Daily fluid intake: 0 cups of caffeine, 2 cups of water  Dental- every 6 months.  Dr. Jake Michael[facility]).  Eye- Visual acuity not assessed per patient preference since they have regular follow up with the ophthalmologist.  Wears corrective lenses.  Sleep patterns- Sleeps 1.5 hours at night.  Wakes feeling exhausted.   CPAP not in use.     TDAP vaccine deferred per patient preference.  Follow up with insurance.  Educational material provided.  Patient Concerns:  States her CPAP machine is 81 years old and requests a nose attachment instead of mouth. Symptoms of depression present regarding feeling overwhelmed; tearful during the visit.  Follow up appointment scheduled with PCP.  Exercise Activities and Dietary recommendations Current Exercise Habits: Home exercise routine, Type of exercise: walking, Time (Minutes): 20, Frequency (Times/Week): 7, Weekly Exercise (Minutes/Week): 140, Intensity: Mild  Goals    . Increase physical activity          Maintain exercise regimen of walking    . Increase water intake          Stay hydrated and drink plenty of water.        Fall Risk Fall Risk  07/03/2017 07/01/2016 06/13/2016 07/01/2015 10/09/2014  Falls in the past year? No Yes No No No  Number falls in past yr: - 1 - - -  Injury with Fall? - Yes - - -  Comment - - - - -  Risk for fall due to : - (No Data) - - -  Risk for fall due to: Comment - Shoe slipped when moving furniture. - - -  Follow up - Education provided;Falls prevention discussed - - -   Depression Screen PHQ 2/9 Scores 07/03/2017 01/27/2017 07/01/2016 06/13/2016  PHQ - 2 Score 1 2 0 0  PHQ- 9 Score 3 3 - -     Cognitive Function MMSE - Mini Mental State Exam 07/03/2017 07/01/2016 07/01/2015  Orientation to time 5 5 5   Orientation to Place 5 5 5   Registration 3 3 3   Attention/ Calculation 5 5 5   Recall 3 3 3   Language-  name 2 objects 2 2 2   Language- repeat 1 1 1   Language- follow 3 step command 3 3 3   Language- read & follow direction 1 1 1   Write a sentence 1 1 1   Copy design 1 1 1   Total score 30 30 30         Immunization History  Administered Date(s) Administered  . Influenza Split 07/18/2014  . Influenza, High Dose Seasonal PF 07/01/2015, 06/13/2016, 05/23/2017  . Influenza,inj,Quad PF,6+ Mos 06/11/2013  . Pneumococcal Conjugate-13 12/18/2013  . Pneumococcal Polysaccharide-23 07/01/2015   Screening Tests Health Maintenance  Topic Date Due  . TETANUS/TDAP  06/26/1952  . MAMMOGRAM  01/13/2018  . INFLUENZA VACCINE  Completed  . DEXA SCAN  Completed  . PNA vac Low Risk Adult  Completed      Plan:    End of life planning; Advance aging; Advanced directives discussed. Copy of current HCPOA/Living Will requested.    I have personally reviewed and noted the following in the patient's chart:   . Medical and social history . Use of alcohol, tobacco or illicit drugs  . Current medications and supplements . Functional ability and status . Nutritional status . Physical activity . Advanced directives . List of other physicians . Hospitalizations, surgeries, and ER visits in previous 12 months . Vitals . Screenings to include cognitive, depression, and falls . Referrals and appointments  In addition, I have reviewed and discussed with patient certain preventive protocols, quality metrics, and best practice recommendations. A written personalized care plan for preventive services as well as general preventive health recommendations were provided to patient.     Varney Biles, LPN  [id number]   Agree with plan and f/u in November to discuss OSA, depression.  [name], NP

## 2017-07-04 NOTE — Telephone Encounter (Signed)
noted 

## 2017-07-04 NOTE — Telephone Encounter (Signed)
Patient did say that she does have a little depression but no ideations or plans, as per patient.  She also stated that she would like an earlier appointment but would have to wait as per Scheduler. She stated she was not going to hurt herself.

## 2017-07-05 ENCOUNTER — Other Ambulatory Visit: Payer: Self-pay | Admitting: Radiology

## 2017-07-05 DIAGNOSIS — Z5181 Encounter for therapeutic drug level monitoring: Secondary | ICD-10-CM

## 2017-07-05 DIAGNOSIS — Z7901 Long term (current) use of anticoagulants: Principal | ICD-10-CM

## 2017-07-06 ENCOUNTER — Other Ambulatory Visit: Payer: Medicare Other

## 2017-07-10 ENCOUNTER — Other Ambulatory Visit (INDEPENDENT_AMBULATORY_CARE_PROVIDER_SITE_OTHER): Payer: Medicare Other

## 2017-07-10 DIAGNOSIS — Z5181 Encounter for therapeutic drug level monitoring: Secondary | ICD-10-CM

## 2017-07-10 DIAGNOSIS — Z7901 Long term (current) use of anticoagulants: Secondary | ICD-10-CM | POA: Diagnosis not present

## 2017-07-10 LAB — PROTIME-INR
INR: 2.1 ratio — AB (ref 0.8–1.0)
PROTHROMBIN TIME: 22.7 s — AB (ref 9.6–13.1)

## 2017-07-14 ENCOUNTER — Ambulatory Visit (INDEPENDENT_AMBULATORY_CARE_PROVIDER_SITE_OTHER): Payer: Medicare Other | Admitting: Internal Medicine

## 2017-07-14 ENCOUNTER — Encounter: Payer: Self-pay | Admitting: Internal Medicine

## 2017-07-14 DIAGNOSIS — R739 Hyperglycemia, unspecified: Secondary | ICD-10-CM

## 2017-07-14 DIAGNOSIS — D0360 Melanoma in situ of unspecified upper limb, including shoulder: Secondary | ICD-10-CM | POA: Diagnosis not present

## 2017-07-14 DIAGNOSIS — C73 Malignant neoplasm of thyroid gland: Secondary | ICD-10-CM

## 2017-07-14 DIAGNOSIS — G4733 Obstructive sleep apnea (adult) (pediatric): Secondary | ICD-10-CM | POA: Diagnosis not present

## 2017-07-14 DIAGNOSIS — E7800 Pure hypercholesterolemia, unspecified: Secondary | ICD-10-CM | POA: Diagnosis not present

## 2017-07-14 DIAGNOSIS — F439 Reaction to severe stress, unspecified: Secondary | ICD-10-CM

## 2017-07-14 DIAGNOSIS — C50919 Malignant neoplasm of unspecified site of unspecified female breast: Secondary | ICD-10-CM | POA: Diagnosis not present

## 2017-07-14 DIAGNOSIS — K219 Gastro-esophageal reflux disease without esophagitis: Secondary | ICD-10-CM | POA: Diagnosis not present

## 2017-07-14 DIAGNOSIS — I4891 Unspecified atrial fibrillation: Secondary | ICD-10-CM

## 2017-07-14 NOTE — Progress Notes (Signed)
Patient ID: Kerri Mills, female   DOB: [birth year], 83 y.o.   MRN: [id number]   Subjective:    Patient ID: Kerri Mills, female    DOB: 12-26-1930, 83 y.o.   MRN: [id number]  HPI  Patient here for a scheduled follow up.  She reports she is feeling better.  Saw rheumatology for arthralgia or multiple joints.  S/p injection of right knee.  Diagnosed with OA of both knees.  Joints are better.  Feels better.  S/p foot surgery 02/2017.  Saw podiatry 06/22/17.  Better.  No chest pain.  No sob.  No acid reflux.  No abdominal pain. Bowels moving.  Handling stress.     Past Medical History:  Diagnosis Date  . Allergy   . Anxiety   . Arthritis   . Breast CA ([place])   . Breast cancer ([name]) 2011   LT LUMPECTOMY  . Colitis   . Depression   . Diverticulitis 2013  . Gastric ulcer   . GERD (gastroesophageal reflux disease)   . Heart disease   . Hypercholesterolemia   . Hypertension   . Infiltrating lobular carcinoma of left breast 2011   T2,N0, ER: 90%; PR 0%; Her 2 neu not amplified. [facility]).  . Melanoma ([place]) 1997  . Melanoma in situ of upper extremity ([place]) 03/19/2011  . Personal history of radiation therapy 2011   BREAST CA  . Seroma    HISTORY OF LFT BREAST  . Sleep apnea   . Thyroid cancer ([place]) 1992   Past Surgical History:  Procedure Laterality Date  . ABDOMINAL HYSTERECTOMY  1973   partial  . BREAST BIOPSY Left 02-13-13   BENIGN BREAST TISSUE WITH CHANGES CONSISTENT WITH FAT NECROSIS  . BREAST BIOPSY Left 01/21/2015   bx done in [facility] 11:00 left 6-8cmfn  . BREAST EXCISIONAL BIOPSY Left 1995   neg  . BREAST EXCISIONAL BIOPSY Left 2011   Breast cancer radiation  . BREAST LUMPECTOMY Left 2011   BREAST CA  . CHOLECYSTECTOMY    . COLONOSCOPY  2013  . MELANOMA EXCISION     RT UPPER ARM  . PARTIAL HYSTERECTOMY     bleeding, ovaries in place.    . THYROID SURGERY  1992   FOR THYROID CANCER  . TONSILLECTOMY     Family History  Problem Relation Age of Onset    . Heart disease Mother   . Cancer Brother        lung   . Cancer Sister        breast  . Breast cancer Neg Hx    Social History   Social History  . Marital status: Widowed    Spouse name: N/A  . Number of children: N/A  . Years of education: N/A   Social History Main Topics  . Smoking status: Never Smoker  . Smokeless tobacco: Never Used  . Alcohol use 0.0 oz/week     Comment: social drinking. average times a week  . Drug use: No  . Sexual activity: No   Other Topics Concern  . None   Social History Narrative  . None    Outpatient Encounter Prescriptions as of 07/14/2017  Medication Sig  . albuterol (PROAIR HFA) 108 (90 Base) MCG/ACT inhaler Inhale 2 puffs into the lungs every 6 (six) hours as needed for wheezing or shortness of breath.  . Cholecalciferol (VITAMIN D) 1000 UNITS capsule Take 1,000 Units by mouth daily.    . DULoxetine (CYMBALTA) 60 MG  capsule TAKE 1 CAPSULE (60 MG TOTAL) BY MOUTH AT BEDTIME.  . fluticasone (FLONASE) 50 MCG/ACT nasal spray PLACE 2 SPRAYS INTO BOTH NOSTRILS DAILY.  Marland Kitchen gabapentin (NEURONTIN) 300 MG capsule TAKE 2 CAPSULES (600 MG TOTAL) BY MOUTH AT BEDTIME.  Marland Kitchen levothyroxine (SYNTHROID, LEVOTHROID) 88 MCG tablet TAKE ONE TABLET BY MOUTH ONCE DAILY BEFORE BREAKFAST  . losartan-hydrochlorothiazide (HYZAAR) 100-12.5 MG tablet TAKE 1 TABLET BY MOUTH DAILY.  Marland Kitchen lovastatin (MEVACOR) 40 MG tablet TAKE 1 TABLET (40 MG TOTAL) BY MOUTH DAILY.  . Multiple Vitamins-Minerals (PRESERVISION AREDS 2 PO) Take 2 tablets by mouth daily.    Marland Kitchen omeprazole (PRILOSEC) 20 MG capsule TAKE 1 CAPSULE (20 MG TOTAL) BY MOUTH DAILY.  Marland Kitchen propranolol (INDERAL) 10 MG tablet TAKE 1 TABLET (10 MG TOTAL) BY MOUTH DAILY AS NEEDED.  Marland Kitchen warfarin (COUMADIN) 3 MG tablet Take 1 and 1/2 tablet on Wednesday and one tablet all other days of the week. (Patient taking differently: Take 1 and 1/2 (4.82m) tablet on Wednesday & Sunday and 1 (363m tablet all other days of the week.)    Facility-Administered Encounter Medications as of 07/14/2017  Medication  . betamethasone acetate-betamethasone sodium phosphate (CELESTONE) injection 3 mg  . betamethasone acetate-betamethasone sodium phosphate (CELESTONE) injection 3 mg    Review of Systems  Constitutional: Negative for appetite change and unexpected weight change.  HENT: Negative for congestion and sinus pressure.   Respiratory: Negative for cough, chest tightness and shortness of breath.   Cardiovascular: Negative for chest pain, palpitations and leg swelling.  Gastrointestinal: Negative for abdominal pain, diarrhea, nausea and vomiting.  Genitourinary: Negative for difficulty urinating and dysuria.  Musculoskeletal: Negative for joint swelling and myalgias.  Skin: Negative for color change and rash.  Neurological: Negative for dizziness, light-headedness and headaches.  Psychiatric/Behavioral: Negative for agitation and dysphoric mood.       Objective:    Physical Exam  Constitutional: She appears well-developed and well-nourished. No distress.  HENT:  Nose: Nose normal.  Mouth/Throat: Oropharynx is clear and moist.  Neck: Neck supple. No thyromegaly present.  Cardiovascular: Normal rate and regular rhythm.   Pulmonary/Chest: Breath sounds normal. No respiratory distress. She has no wheezes.  Abdominal: Soft. Bowel sounds are normal. There is no tenderness.  Musculoskeletal: She exhibits no edema or tenderness.  Lymphadenopathy:    She has no cervical adenopathy.  Skin: No rash noted. No erythema.  Psychiatric: She has a normal mood and affect. Her behavior is normal.    BP 124/64 (BP Location: Left Arm, Patient Position: Sitting, Cuff Size: Normal)   Pulse 74   Temp 97.7 F (36.5 C) (Oral)   Resp 12   Wt 183 lb 6.4 oz (83.2 kg)   SpO2 96%   BMI 30.52 kg/m  Wt Readings from Last 3 Encounters:  07/14/17 183 lb 6.4 oz (83.2 kg)  07/03/17 183 lb (83 kg)  05/23/17 182 lb 3.2 oz (82.6 kg)      Lab Results  Component Value Date   WBC 5.8 06/20/2016   HGB 12.2 06/20/2016   HCT 36.5 06/20/2016   PLT 247.0 06/20/2016   GLUCOSE 110 (H) 05/23/2017   CHOL 146 03/24/2017   TRIG 204.0 (H) 03/24/2017   HDL 33.60 (L) 03/24/2017   LDLDIRECT 75.0 03/24/2017   LDLCALC 91 06/20/2016   ALT 13 02/16/2017   AST 16 02/16/2017   NA 139 05/23/2017   K 4.1 05/23/2017   CL 100 05/23/2017   CREATININE 0.84 05/23/2017   BUN 11 05/23/2017  CO2 34 (H) 05/23/2017   TSH 1.30 02/16/2017   INR 2.1 (H) 07/10/2017   HGBA1C 6.6 (H) 06/30/2017    Dg Knee 1-2 Views Right  Result Date: 05/23/2017 CLINICAL DATA:  Right knee pain, chronic. EXAM: RIGHT KNEE - 1-2 VIEW COMPARISON:  05/31/2011 FINDINGS: Mild degenerative changes with joint space loss and spurring in all 3 compartments. No acute bony abnormality. Specifically, no fracture, subluxation, or dislocation. Soft tissues are intact. No significant joint effusion. IMPRESSION: Mild tricompartment degenerative changes. No acute bony abnormality. Electronically Signed   By: Rolm Baptise M.D.   On: 05/23/2017 15:15   US Venous Img Lower Unilateral Right  Result Date: 05/23/2017 CLINICAL DATA:  Right leg pain and increased swelling x3 weeks. EXAM: RIGHT LOWER EXTREMITY VENOUS DOPPLER ULTRASOUND TECHNIQUE: Gray-scale sonography with graded compression, as well as color Doppler and duplex ultrasound were performed to evaluate the lower extremity deep venous systems from the level of the common femoral vein and including the common femoral, femoral, profunda femoral, popliteal and calf veins including the posterior tibial, peroneal and gastrocnemius veins when visible. The superficial great saphenous vein was also interrogated. Spectral Doppler was utilized to evaluate flow at rest and with distal augmentation maneuvers in the common femoral, femoral and popliteal veins. COMPARISON:  None. FINDINGS: Contralateral Common Femoral Vein: Respiratory phasicity  is normal and symmetric with the symptomatic side. No evidence of thrombus. Normal compressibility. Common Femoral Vein: No evidence of thrombus. Normal compressibility, respiratory phasicity and response to augmentation. Saphenofemoral Junction: No evidence of thrombus. Normal compressibility and flow on color Doppler imaging. Profunda Femoral Vein: No evidence of thrombus. Normal compressibility and flow on color Doppler imaging. Femoral Vein: No evidence of thrombus. Normal compressibility, respiratory phasicity and response to augmentation. Popliteal Vein: No evidence of thrombus. Normal compressibility, respiratory phasicity and response to augmentation. Calf Veins: No evidence of thrombus. Normal compressibility and flow on color Doppler imaging. Superficial Great Saphenous Vein: No evidence of thrombus. Normal compressibility and flow on color Doppler imaging. Venous Reflux:  None. Other Findings:  Trace subcutaneous edema of the calf. IMPRESSION: No evidence of DVT within the right lower extremity. Mild subcutaneous calf edema. Electronically Signed   By: Ashley Royalty M.D.   On: 05/23/2017 14:59   Dg Hip Unilat W Or W/o Pelvis 2-3 Views Left  Result Date: 05/23/2017 CLINICAL DATA:  Left hip pain EXAM: DG HIP (WITH OR WITHOUT PELVIS) 2-3V LEFT COMPARISON:  None. FINDINGS: Hip joints and SI joints are symmetric and unremarkable. No acute bony abnormality. Specifically, no fracture, subluxation, or dislocation. Soft tissues are intact. IMPRESSION: No acute bony abnormality. Electronically Signed   By: Rolm Baptise M.D.   On: 05/23/2017 15:14       Assessment & Plan:   Problem List Items Addressed This Visit    Atrial fibrillation ([place])    On coumadin.  Follow pt/inr.        GERD (gastroesophageal reflux disease)    Controlled on current regimen.        Hypercholesterolemia    On lovastatin.  Low cholesterol diet and exercise.  Follow lipid panel and liver function tests.         Hyperglycemia    Low carb diet and exercise.  Follow met b and a1c.        Infiltrating lobular carcinoma of left breast    Saw Dr Bary Castilla 01/25/17 - recommended f/u mammogram in one year.        Melanoma in situ of  upper extremity ([place])    Followed by dermatology.       Obstructive sleep apnea    Has known sleep apnea.  Has not been using cpap.  States needs new machine and supplies.  Will obtain previous sleep study results.  (physician office that ordered - [facility] in [place])        Stress    Stable on cymbalta.  Doing better.  Follow.        Thyroid cancer ([place])    S/p XRT post op.  On thyroid replacement.  Follow tsh.            Einar Pheasant, MD

## 2017-07-16 ENCOUNTER — Encounter: Payer: Self-pay | Admitting: Internal Medicine

## 2017-07-16 NOTE — Assessment & Plan Note (Signed)
Stable on cymbalta.  Doing better.  Follow.

## 2017-07-16 NOTE — Assessment & Plan Note (Signed)
Controlled on current regimen.   

## 2017-07-16 NOTE — Assessment & Plan Note (Signed)
Saw Dr Bary Castilla 01/25/17 - recommended f/u mammogram in one year.

## 2017-07-16 NOTE — Assessment & Plan Note (Signed)
Followed by dermatology

## 2017-07-16 NOTE — Assessment & Plan Note (Signed)
Low carb diet and exercise.  Follow met b and a1c.   

## 2017-07-16 NOTE — Assessment & Plan Note (Signed)
On coumadin.  Follow pt/inr.  

## 2017-07-16 NOTE — Assessment & Plan Note (Signed)
S/p XRT post op.  On thyroid replacement.  Follow tsh.

## 2017-07-16 NOTE — Assessment & Plan Note (Signed)
On lovastatin.  Low cholesterol diet and exercise.  Follow lipid panel and liver function tests.   

## 2017-07-16 NOTE — Assessment & Plan Note (Signed)
Has known sleep apnea.  Has not been using cpap.  States needs new machine and supplies.  Will obtain previous sleep study results.  (physician office that ordered [facility] in [place])

## 2017-07-28 ENCOUNTER — Other Ambulatory Visit: Payer: Self-pay | Admitting: Radiology

## 2017-07-28 DIAGNOSIS — Z7901 Long term (current) use of anticoagulants: Principal | ICD-10-CM

## 2017-07-28 DIAGNOSIS — Z5181 Encounter for therapeutic drug level monitoring: Secondary | ICD-10-CM

## 2017-07-31 ENCOUNTER — Other Ambulatory Visit (INDEPENDENT_AMBULATORY_CARE_PROVIDER_SITE_OTHER): Payer: Medicare Other

## 2017-07-31 DIAGNOSIS — Z7901 Long term (current) use of anticoagulants: Secondary | ICD-10-CM

## 2017-07-31 DIAGNOSIS — Z5181 Encounter for therapeutic drug level monitoring: Secondary | ICD-10-CM

## 2017-07-31 LAB — PROTIME-INR
INR: 2.6 ratio — AB (ref 0.8–1.0)
Prothrombin Time: 27.7 s — ABNORMAL HIGH (ref 9.6–13.1)

## 2017-08-02 ENCOUNTER — Other Ambulatory Visit: Payer: Self-pay | Admitting: Internal Medicine

## 2017-08-10 ENCOUNTER — Ambulatory Visit: Payer: Medicare Other | Admitting: Internal Medicine

## 2017-08-29 ENCOUNTER — Other Ambulatory Visit: Payer: Self-pay | Admitting: Radiology

## 2017-08-29 DIAGNOSIS — Z5181 Encounter for therapeutic drug level monitoring: Secondary | ICD-10-CM

## 2017-08-29 DIAGNOSIS — Z7901 Long term (current) use of anticoagulants: Principal | ICD-10-CM

## 2017-08-30 ENCOUNTER — Other Ambulatory Visit (INDEPENDENT_AMBULATORY_CARE_PROVIDER_SITE_OTHER): Payer: Medicare Other

## 2017-08-30 DIAGNOSIS — Z5181 Encounter for therapeutic drug level monitoring: Secondary | ICD-10-CM

## 2017-08-30 DIAGNOSIS — Z7901 Long term (current) use of anticoagulants: Secondary | ICD-10-CM

## 2017-08-30 LAB — PROTIME-INR
INR: 2.7 ratio — ABNORMAL HIGH (ref 0.8–1.0)
Prothrombin Time: 29.1 s — ABNORMAL HIGH (ref 9.6–13.1)

## 2017-08-31 ENCOUNTER — Other Ambulatory Visit: Payer: Medicare Other

## 2017-09-14 ENCOUNTER — Ambulatory Visit: Payer: Medicare Other | Admitting: General Surgery

## 2017-09-14 ENCOUNTER — Telehealth: Payer: Self-pay | Admitting: *Deleted

## 2017-09-14 ENCOUNTER — Encounter: Payer: Self-pay | Admitting: General Surgery

## 2017-09-14 VITALS — BP 104/70 | HR 102 | Resp 14 | Ht 64.0 in | Wt 184.0 lb

## 2017-09-14 DIAGNOSIS — Z853 Personal history of malignant neoplasm of breast: Secondary | ICD-10-CM

## 2017-09-14 MED ORDER — SULFAMETHOXAZOLE-TRIMETHOPRIM 800-160 MG PO TABS: 1.0000 | ORAL_TABLET | Freq: Two times a day (BID) | ORAL | 0 refills | Status: DC

## 2017-09-14 MED ORDER — TRIAMCINOLONE ACETONIDE 0.025 % EX OINT: 1.0000 "application " | TOPICAL_OINTMENT | Freq: Two times a day (BID) | CUTANEOUS | 0 refills | Status: DC

## 2017-09-14 NOTE — Telephone Encounter (Signed)
appt per [facility]

## 2017-09-14 NOTE — Progress Notes (Signed)
Patient ID: Kerri Mills, female   DOB: 09/29/1930, 83 y.o.   MRN: [id number]  Chief Complaint  Patient presents with  . Follow-up    HPI Kerri Mills is a 83 y.o. female here today  egarding her left nipple. She stated that it is red and itchy and a little swollen, the itching seems to be worse in the afternoon.Started on 09/10/2017. She has been using antibiotic cream once a day and also Clotrimazole once a day. She cleans the nipple and uses a hair dryer to dry it before applying the cream. She stated that she has always had problems with the breast but nothing like she is experiencing now. HPI  Past Medical History:  Diagnosis Date  . Allergy   . Anxiety   . Arthritis   . Breast CA ([place])   . Breast cancer ([place]) 2011   LT LUMPECTOMY  . Colitis   . Depression   . Diverticulitis 2013  . Gastric ulcer   . GERD (gastroesophageal reflux disease)   . Heart disease   . Hypercholesterolemia   . Hypertension   . Infiltrating lobular carcinoma of left breast 2011   T2,N0, ER: 90%; PR 0%; Her 2 neu not amplified. [facility]).  . Melanoma ([place]) 1997  . Melanoma in situ of upper extremity ([place]) 03/19/2011  . Personal history of radiation therapy 2011   BREAST CA  . Seroma    HISTORY OF LFT BREAST  . Sleep apnea   . Thyroid cancer ([place]) 1992    Past Surgical History:  Procedure Laterality Date  . ABDOMINAL HYSTERECTOMY  1973   partial  . BREAST BIOPSY Left 02-13-13   BENIGN BREAST TISSUE WITH CHANGES CONSISTENT WITH FAT NECROSIS  . BREAST BIOPSY Left 01/21/2015   bx done in [facility] office 11:00 left 6-8cmfn  . BREAST EXCISIONAL BIOPSY Left 1995   neg  . BREAST EXCISIONAL BIOPSY Left 2011   Breast cancer radiation  . BREAST LUMPECTOMY Left 2011   BREAST CA  . CHOLECYSTECTOMY    . COLONOSCOPY  2013  . MELANOMA EXCISION     RT UPPER ARM  . PARTIAL HYSTERECTOMY     bleeding, ovaries in place.    . THYROID SURGERY  1992   FOR THYROID CANCER  . TONSILLECTOMY       Family History  Problem Relation Age of Onset  . Heart disease Mother   . Cancer Brother        lung   . Cancer Sister        breast  . Breast cancer Neg Hx     Social History Social History   Tobacco Use  . Smoking status: Never Smoker  . Smokeless tobacco: Never Used  Substance Use Topics  . Alcohol use: Yes    Alcohol/week: 0.0 oz    Comment: social drinking. average times a week  . Drug use: No    Allergies  Allergen Reactions  . Atorvastatin     Other reaction(s): Other (See Comments) STIFFNESS AND SORE STIFFNESS AND SORE  . Lipitor [Atorvastatin Calcium] Other (See Comments)    Stiffness & soreness  . Penicillins Rash    REACTION: Unknown reaction    Current Outpatient Medications  Medication Sig Dispense Refill  . albuterol (PROAIR HFA) 108 (90 Base) MCG/ACT inhaler Inhale 2 puffs into the lungs every 6 (six) hours as needed for wheezing or shortness of breath. 1 Inhaler 3  . Cholecalciferol (VITAMIN D) 1000 UNITS capsule Take  1,000 Units by mouth daily.      . DULoxetine (CYMBALTA) 60 MG capsule TAKE 1 CAPSULE (60 MG TOTAL) BY MOUTH AT BEDTIME. 90 capsule 1  . fluticasone (FLONASE) 50 MCG/ACT nasal spray PLACE 2 SPRAYS INTO BOTH NOSTRILS DAILY. 16 g 6  . gabapentin (NEURONTIN) 300 MG capsule TAKE 2 CAPSULES (600 MG TOTAL) BY MOUTH AT BEDTIME. 180 capsule 1  . levothyroxine (SYNTHROID, LEVOTHROID) 88 MCG tablet TAKE ONE TABLET BY MOUTH ONCE DAILY BEFORE BREAKFAST 90 tablet 3  . losartan-hydrochlorothiazide (HYZAAR) 100-12.5 MG tablet TAKE 1 TABLET BY MOUTH DAILY. 90 tablet 2  . lovastatin (MEVACOR) 40 MG tablet TAKE 1 TABLET (40 MG TOTAL) BY MOUTH DAILY. 90 tablet 2  . Multiple Vitamins-Minerals (PRESERVISION AREDS 2 PO) Take 2 tablets by mouth daily.      Marland Kitchen omeprazole (PRILOSEC) 20 MG capsule TAKE 1 CAPSULE (20 MG TOTAL) BY MOUTH DAILY. 90 capsule 3  . propranolol (INDERAL) 10 MG tablet TAKE 1 TABLET (10 MG TOTAL) BY MOUTH DAILY AS NEEDED. 90 tablet 1  .  warfarin (COUMADIN) 3 MG tablet Take 1 and 1/2 tablet on Wednesday and one tablet all other days of the week. (Patient taking differently: Take 1 and 1/2 (4.5mg ) tablet on Wednesday & Sunday and 1 (3mg ) tablet all other days of the week.) 100 tablet 1  . warfarin (COUMADIN) 3 MG tablet TAKE 1 TABLET (3 MG TOTAL) BY MOUTH DAILY. 90 tablet 1  . sulfamethoxazole-trimethoprim (BACTRIM DS,SEPTRA DS) 800-160 MG tablet Take 1 tablet by mouth 2 (two) times daily. 14 tablet 0  . triamcinolone (KENALOG) 0.025 % ointment Apply 1 application topically 2 (two) times daily. 30 g 0   Current Facility-Administered Medications  Medication Dose Route Frequency Provider Last Rate Last Dose  . betamethasone acetate-betamethasone sodium phosphate (CELESTONE) injection 3 mg  3 mg Intramuscular Once Daylene Katayama M, DPM      . betamethasone acetate-betamethasone sodium phosphate (CELESTONE) injection 3 mg  3 mg Intramuscular Once Edrick Kins, DPM        Review of Systems Review of Systems  Constitutional: Negative.   Respiratory: Negative.   Cardiovascular: Negative.     Blood pressure 104/70, pulse (!) 102, resp. rate 14, height 5\' 4"  (1.626 m), weight 184 lb (83.5 kg).  Physical Exam Physical Exam  Constitutional: She is oriented to person, place, and time. She appears well-developed and well-nourished.  Neurological: She is alert and oriented to person, place, and time.  Skin: Skin is warm and dry.   Left breast shows significant puckering at the region of the nipple areolar complex where she had the previous lumpectomy followed by radiation.  This deformity according the patient has been there for a long time.  The nipple itself appears to be mildly prominent with a little bit of skin redness and irritation without any induration of   the skin.  No active drainage is noted and no deep seeded mass or fluctuance noted.  No axillary or cervical adenopathy Data Revew in the last few daysiewed Prior notes  reviewed  Assessment    Dermatitis possible infection although it does not have the typical appearance of cellulitis    Plan Discussed fully with the patient she has tried antifungal cream which has not helped.  We will try Kenalog cream to the area was also treated with empiric Bactrim   Rx sent to [facility] . Patient to return in 2 weeks to see DR. [facility].     HPI, Physical Exam,  Assessment and Plan have been scribed under the direction and in the presence of Mckinley Jewel, MD  Gaspar Cola, CMA I have completed the exam and reviewed the above documentation for accuracy and completeness.  I agree with the above.  Haematologist has been used and any errors in dictation or transcription are unintentional.  Tayshun Gappa G. Jamal Collin, M.D., F.A.C.S.    Junie Panning G 09/15/2017, 9:56 AM

## 2017-09-14 NOTE — Patient Instructions (Signed)
Patient to return in 2 weeks to see DR. Byrnett.

## 2017-09-14 NOTE — Telephone Encounter (Signed)
Patient called regarding her right nipple. She stated that it is red and itchy and a little swollen, the itching seems to be worse in the afternoon. She has been using antibiotic cream once a day and also Clotrimazole once a day. She cleans the nipple and uses a hair dryer to dry it before applying the cream. She stated that she has always had problems with the breast but nothing like she is experiencing now.

## 2017-09-21 ENCOUNTER — Ambulatory Visit: Payer: Medicare Other | Admitting: Podiatry

## 2017-09-25 ENCOUNTER — Ambulatory Visit (INDEPENDENT_AMBULATORY_CARE_PROVIDER_SITE_OTHER): Payer: Medicare Other | Admitting: General Surgery

## 2017-09-25 ENCOUNTER — Encounter: Payer: Self-pay | Admitting: General Surgery

## 2017-09-25 VITALS — BP 122/70 | HR 72 | Resp 13 | Ht 65.0 in | Wt 183.0 lb

## 2017-09-25 DIAGNOSIS — Z853 Personal history of malignant neoplasm of breast: Secondary | ICD-10-CM

## 2017-09-25 DIAGNOSIS — L309 Dermatitis, unspecified: Secondary | ICD-10-CM

## 2017-09-25 NOTE — Patient Instructions (Addendum)
Patient to return in one month. Keep using the cream.  The patient is aware to call back for any questions or concerns.

## 2017-09-25 NOTE — Progress Notes (Signed)
Patient ID: Kerri Mills, female   DOB: June 17, 1931, 83 y.o.   MRN: [id number]  Chief Complaint  Patient presents with  . Follow-up    HPI Kerri Mills is a 83 y.o. female here today for her follow up left breast dermatitis. Patient states the area is much better. No pain.  HPI  Past Medical History:  Diagnosis Date  . Allergy   . Anxiety   . Arthritis   . Breast CA ([place])   . Breast cancer ([place]) 2011   LT LUMPECTOMY  . Colitis   . Depression   . Diverticulitis 2013  . Gastric ulcer   . GERD (gastroesophageal reflux disease)   . Heart disease   . Hypercholesterolemia   . Hypertension   . Infiltrating lobular carcinoma of left breast 2011   T2,N0, ER: 90%; PR 0%; Her 2 neu not amplified. [facility]).  . Melanoma ([place]) 1997  . Melanoma in situ of upper extremity ([place]) 03/19/2011  . Personal history of radiation therapy 2011   BREAST CA  . Seroma    HISTORY OF LFT BREAST  . Sleep apnea   . Thyroid cancer ([place]) 1992    Past Surgical History:  Procedure Laterality Date  . ABDOMINAL HYSTERECTOMY  1973   partial  . BREAST BIOPSY Left 02-13-13   BENIGN BREAST TISSUE WITH CHANGES CONSISTENT WITH FAT NECROSIS  . BREAST BIOPSY Left 01/21/2015   bx done in [facility] 11:00 left 6-8cmfn  . BREAST EXCISIONAL BIOPSY Left 1995   neg  . BREAST EXCISIONAL BIOPSY Left 2011   Breast cancer radiation  . BREAST LUMPECTOMY Left 2011   BREAST CA  . CHOLECYSTECTOMY    . COLONOSCOPY  2013  . MELANOMA EXCISION     RT UPPER ARM  . PARTIAL HYSTERECTOMY     bleeding, ovaries in place.    . THYROID SURGERY  1992   FOR THYROID CANCER  . TONSILLECTOMY      Family History  Problem Relation Age of Onset  . Heart disease Mother   . Cancer Brother        lung   . Cancer Sister        breast  . Breast cancer Neg Hx     Social History Social History   Tobacco Use  . Smoking status: Never Smoker  . Smokeless tobacco: Never Used  Substance Use Topics  . Alcohol use: Yes     Alcohol/week: 0.0 oz    Comment: social drinking. average times a week  . Drug use: No    Allergies  Allergen Reactions  . Atorvastatin     Other reaction(s): Other (See Comments) STIFFNESS AND SORE STIFFNESS AND SORE  . Lipitor [Atorvastatin Calcium] Other (See Comments)    Stiffness & soreness  . Penicillins Rash    REACTION: Unknown reaction    Current Outpatient Medications  Medication Sig Dispense Refill  . albuterol (PROAIR HFA) 108 (90 Base) MCG/ACT inhaler Inhale 2 puffs into the lungs every 6 (six) hours as needed for wheezing or shortness of breath. 1 Inhaler 3  . Cholecalciferol (VITAMIN D) 1000 UNITS capsule Take 1,000 Units by mouth daily.      . DULoxetine (CYMBALTA) 60 MG capsule TAKE 1 CAPSULE (60 MG TOTAL) BY MOUTH AT BEDTIME. 90 capsule 1  . fluticasone (FLONASE) 50 MCG/ACT nasal spray PLACE 2 SPRAYS INTO BOTH NOSTRILS DAILY. 16 g 6  . gabapentin (NEURONTIN) 300 MG capsule TAKE 2 CAPSULES (600 MG TOTAL)  BY MOUTH AT BEDTIME. 180 capsule 1  . levothyroxine (SYNTHROID, LEVOTHROID) 88 MCG tablet TAKE ONE TABLET BY MOUTH ONCE DAILY BEFORE BREAKFAST 90 tablet 3  . losartan-hydrochlorothiazide (HYZAAR) 100-12.5 MG tablet TAKE 1 TABLET BY MOUTH DAILY. 90 tablet 2  . lovastatin (MEVACOR) 40 MG tablet TAKE 1 TABLET (40 MG TOTAL) BY MOUTH DAILY. 90 tablet 2  . Multiple Vitamins-Minerals (PRESERVISION AREDS 2 PO) Take 2 tablets by mouth daily.      Marland Kitchen omeprazole (PRILOSEC) 20 MG capsule TAKE 1 CAPSULE (20 MG TOTAL) BY MOUTH DAILY. 90 capsule 3  . propranolol (INDERAL) 10 MG tablet TAKE 1 TABLET (10 MG TOTAL) BY MOUTH DAILY AS NEEDED. 90 tablet 1  . sulfamethoxazole-trimethoprim (BACTRIM DS,SEPTRA DS) 800-160 MG tablet Take 1 tablet by mouth 2 (two) times daily. 14 tablet 0  . triamcinolone (KENALOG) 0.025 % ointment Apply 1 application topically 2 (two) times daily. 30 g 0  . warfarin (COUMADIN) 3 MG tablet Take 1 and 1/2 tablet on Wednesday and one tablet all other days of  the week. (Patient taking differently: Take 1 and 1/2 (4.5mg ) tablet on Wednesday & Sunday and 1 (3mg ) tablet all other days of the week.) 100 tablet 1  . warfarin (COUMADIN) 3 MG tablet TAKE 1 TABLET (3 MG TOTAL) BY MOUTH DAILY. 90 tablet 1   Current Facility-Administered Medications  Medication Dose Route Frequency Provider Last Rate Last Dose  . betamethasone acetate-betamethasone sodium phosphate (CELESTONE) injection 3 mg  3 mg Intramuscular Once Daylene Katayama M, DPM      . betamethasone acetate-betamethasone sodium phosphate (CELESTONE) injection 3 mg  3 mg Intramuscular Once Edrick Kins, DPM        Review of Systems Review of Systems  Constitutional: Negative.   Respiratory: Negative.   Cardiovascular: Negative.     Blood pressure 122/70, pulse 72, resp. rate 13, height 5\' 5"  (1.651 m), weight 183 lb (83 kg).  Physical Exam Physical Exam  Constitutional: She is oriented to person, place, and time. She appears well-developed and well-nourished.  Cardiovascular: Normal rate, regular rhythm and normal heart sounds.  Pulmonary/Chest: Effort normal and breath sounds normal.    Neurological: She is alert and oriented to person, place, and time.  Skin: Skin is warm and dry.   The CMA who saw the patient at the time of initial presentation 11 days ago reported marked improvement.  Data Reviewed September 14, 2017 office notes.  April 2018 mammogram recommended a six-month follow-up for suspected progressive fat necrosis.  The patient has not been able to complete this due to other family concerns.  She is now almost 1 year out from the exam of bilateral exam in April 2019.  July 06, 2015 left breast biopsy showed fat necrosis.  Assessment    Resolving dermatitis involving the left nipple/areolar complex.  Significant scarring of the breast post prior wide excision/radiation.    Plan    There are dense calcifications in the breast mammogram from her prior treatments,  making it very difficult to separate any distinct area of concern.  The scarring is far more pronounced last exam in 2017-02-28.    Patient to return in one month. Keep using the cream. The patient is aware to call back for any questions or concerns.     HPI, Physical Exam, Assessment and Plan have been scribed under the direction and in the presence of Hervey Ard, MD.  Gaspar Cola, CMA    HPI, Physical Exam, Assessment and Plan  have been scribed under the direction and in the presence of Hervey Ard, MD.  Gaspar Cola, CMA  Robert Bellow 09/26/2017, 11:55 AM

## 2017-09-26 DIAGNOSIS — L309 Dermatitis, unspecified: Secondary | ICD-10-CM | POA: Insufficient documentation

## 2017-09-27 ENCOUNTER — Other Ambulatory Visit: Payer: Self-pay | Admitting: Radiology

## 2017-09-27 DIAGNOSIS — Z5181 Encounter for therapeutic drug level monitoring: Secondary | ICD-10-CM

## 2017-09-27 DIAGNOSIS — Z7901 Long term (current) use of anticoagulants: Principal | ICD-10-CM

## 2017-09-28 ENCOUNTER — Other Ambulatory Visit (INDEPENDENT_AMBULATORY_CARE_PROVIDER_SITE_OTHER): Payer: Medicare Other

## 2017-09-28 DIAGNOSIS — Z7901 Long term (current) use of anticoagulants: Secondary | ICD-10-CM | POA: Diagnosis not present

## 2017-09-28 DIAGNOSIS — Z5181 Encounter for therapeutic drug level monitoring: Secondary | ICD-10-CM | POA: Diagnosis not present

## 2017-09-28 LAB — PROTIME-INR
INR: 3.1 ratio — ABNORMAL HIGH (ref 0.8–1.0)
Prothrombin Time: 33.6 s — ABNORMAL HIGH (ref 9.6–13.1)

## 2017-09-29 ENCOUNTER — Other Ambulatory Visit: Payer: Self-pay

## 2017-09-29 ENCOUNTER — Observation Stay
Admission: EM | Admit: 2017-09-29 | Discharge: 2017-09-30 | Disposition: A | Discharge status: Home / Self Care | Payer: Medicare Other | Attending: Internal Medicine | Admitting: Internal Medicine

## 2017-09-29 ENCOUNTER — Ambulatory Visit: Payer: Self-pay | Admitting: *Deleted

## 2017-09-29 ENCOUNTER — Emergency Department: Payer: Medicare Other

## 2017-09-29 DIAGNOSIS — Z8719 Personal history of other diseases of the digestive system: Secondary | ICD-10-CM | POA: Diagnosis not present

## 2017-09-29 DIAGNOSIS — I10 Essential (primary) hypertension: Secondary | ICD-10-CM

## 2017-09-29 DIAGNOSIS — R001 Bradycardia, unspecified: Secondary | ICD-10-CM | POA: Diagnosis not present

## 2017-09-29 DIAGNOSIS — Z923 Personal history of irradiation: Secondary | ICD-10-CM | POA: Diagnosis not present

## 2017-09-29 DIAGNOSIS — E7800 Pure hypercholesterolemia, unspecified: Secondary | ICD-10-CM | POA: Insufficient documentation

## 2017-09-29 DIAGNOSIS — G4733 Obstructive sleep apnea (adult) (pediatric): Secondary | ICD-10-CM | POA: Diagnosis not present

## 2017-09-29 DIAGNOSIS — Z85820 Personal history of malignant melanoma of skin: Secondary | ICD-10-CM | POA: Diagnosis not present

## 2017-09-29 DIAGNOSIS — I447 Left bundle-branch block, unspecified: Secondary | ICD-10-CM | POA: Diagnosis not present

## 2017-09-29 DIAGNOSIS — Z853 Personal history of malignant neoplasm of breast: Secondary | ICD-10-CM | POA: Insufficient documentation

## 2017-09-29 DIAGNOSIS — R0602 Shortness of breath: Secondary | ICD-10-CM | POA: Diagnosis not present

## 2017-09-29 DIAGNOSIS — I2489 Other forms of acute ischemic heart disease: Secondary | ICD-10-CM

## 2017-09-29 DIAGNOSIS — I4892 Unspecified atrial flutter: Secondary | ICD-10-CM | POA: Diagnosis not present

## 2017-09-29 DIAGNOSIS — F419 Anxiety disorder, unspecified: Secondary | ICD-10-CM | POA: Diagnosis not present

## 2017-09-29 DIAGNOSIS — M1990 Unspecified osteoarthritis, unspecified site: Secondary | ICD-10-CM | POA: Diagnosis not present

## 2017-09-29 DIAGNOSIS — I4891 Unspecified atrial fibrillation: Secondary | ICD-10-CM | POA: Diagnosis not present

## 2017-09-29 DIAGNOSIS — R079 Chest pain, unspecified: Secondary | ICD-10-CM | POA: Diagnosis not present

## 2017-09-29 DIAGNOSIS — Z79899 Other long term (current) drug therapy: Secondary | ICD-10-CM | POA: Insufficient documentation

## 2017-09-29 DIAGNOSIS — I071 Rheumatic tricuspid insufficiency: Secondary | ICD-10-CM | POA: Insufficient documentation

## 2017-09-29 DIAGNOSIS — E039 Hypothyroidism, unspecified: Secondary | ICD-10-CM | POA: Diagnosis not present

## 2017-09-29 DIAGNOSIS — Z85850 Personal history of malignant neoplasm of thyroid: Secondary | ICD-10-CM | POA: Insufficient documentation

## 2017-09-29 DIAGNOSIS — I248 Other forms of acute ischemic heart disease: Secondary | ICD-10-CM

## 2017-09-29 DIAGNOSIS — F329 Major depressive disorder, single episode, unspecified: Secondary | ICD-10-CM | POA: Diagnosis not present

## 2017-09-29 DIAGNOSIS — R0789 Other chest pain: Secondary | ICD-10-CM | POA: Diagnosis not present

## 2017-09-29 DIAGNOSIS — K219 Gastro-esophageal reflux disease without esophagitis: Secondary | ICD-10-CM | POA: Insufficient documentation

## 2017-09-29 DIAGNOSIS — Z880 Allergy status to penicillin: Secondary | ICD-10-CM | POA: Insufficient documentation

## 2017-09-29 DIAGNOSIS — G629 Polyneuropathy, unspecified: Secondary | ICD-10-CM | POA: Insufficient documentation

## 2017-09-29 DIAGNOSIS — I481 Persistent atrial fibrillation: Secondary | ICD-10-CM | POA: Diagnosis not present

## 2017-09-29 DIAGNOSIS — Z888 Allergy status to other drugs, medicaments and biological substances status: Secondary | ICD-10-CM | POA: Diagnosis not present

## 2017-09-29 DIAGNOSIS — Z7901 Long term (current) use of anticoagulants: Secondary | ICD-10-CM | POA: Insufficient documentation

## 2017-09-29 LAB — COMPREHENSIVE METABOLIC PANEL
ALT: 15 U/L (ref 14–54)
AST: 24 U/L (ref 15–41)
Albumin: 3.6 g/dL (ref 3.5–5.0)
Alkaline Phosphatase: 101 U/L (ref 38–126)
Anion gap: 7 (ref 5–15)
BILIRUBIN TOTAL: 0.4 mg/dL (ref 0.3–1.2)
BUN: 14 mg/dL (ref 6–20)
CO2: 29 mmol/L (ref 22–32)
CREATININE: 0.79 mg/dL (ref 0.44–1.00)
Calcium: 9.5 mg/dL (ref 8.9–10.3)
Chloride: 104 mmol/L (ref 101–111)
GFR calc non Af Amer: >60 mL/min (ref >60)
Glucose, Bld: 104 mg/dL — ABNORMAL HIGH (ref 65–99)
Potassium: 3.9 mmol/L (ref 3.5–5.1)
Sodium: 140 mmol/L (ref 135–145)
Total Protein: 6.3 g/dL — ABNORMAL LOW (ref 6.5–8.1)

## 2017-09-29 LAB — TROPONIN I
TROPONIN I: 0.03 ng/mL — AB (ref <0.03)
Troponin I: 0.03 ng/mL (ref <0.03)

## 2017-09-29 LAB — CBC
HCT: 37.7 % (ref 35.0–47.0)
HEMOGLOBIN: 12.3 g/dL (ref 12.0–16.0)
MCH: 29 pg (ref 26.0–34.0)
MCHC: 32.7 g/dL (ref 32.0–36.0)
MCV: 88.5 fL (ref 80.0–100.0)
Platelets: 248 10*3/uL (ref 150–440)
RBC: 4.26 MIL/uL (ref 3.80–5.20)
RDW: 14 % (ref 11.5–14.5)
WBC: 6.5 10*3/uL (ref 3.6–11.0)

## 2017-09-29 LAB — LIPID PANEL
CHOL/HDL RATIO: 5.3 ratio
Cholesterol: 186 mg/dL (ref 0–200)
HDL: 35 mg/dL — ABNORMAL LOW (ref >40)
LDL Cholesterol: 97 mg/dL (ref 0–99)
Triglycerides: 268 mg/dL — ABNORMAL HIGH (ref <150)
VLDL: 54 mg/dL — ABNORMAL HIGH (ref 0–40)

## 2017-09-29 LAB — PROTIME-INR
INR: 2.66
PROTHROMBIN TIME: 28.1 s — AB (ref 11.4–15.2)

## 2017-09-29 MED ORDER — METOPROLOL TARTRATE 5 MG/5ML IV SOLN: 2.5000 mg | Freq: Four times a day (QID) | INTRAVENOUS | Status: DC | PRN

## 2017-09-29 MED ORDER — DILTIAZEM HCL 25 MG/5ML IV SOLN
10.0000 mg | Freq: Once | INTRAVENOUS | Status: AC
Start: 2017-09-29 — End: 2017-09-29
  Administered 2017-09-29: 10 mg via INTRAVENOUS
  Filled 2017-09-29: qty 5

## 2017-09-29 MED ORDER — ACETAMINOPHEN 325 MG PO TABS
650.0000 mg | ORAL_TABLET | Freq: Four times a day (QID) | ORAL | Status: DC | PRN
  Administered 2017-09-29 – 2017-09-30 (×2): 650 mg via ORAL
  Filled 2017-09-29 (×2): qty 2

## 2017-09-29 MED ORDER — GABAPENTIN 300 MG PO CAPS
600.0000 mg | ORAL_CAPSULE | Freq: Every day | ORAL | Status: DC
  Administered 2017-09-29: 600 mg via ORAL
  Filled 2017-09-29: qty 2

## 2017-09-29 MED ORDER — DILTIAZEM HCL 100 MG IV SOLR
10.0000 mg/h | INTRAVENOUS | Status: DC
  Administered 2017-09-29: 10 mg/h via INTRAVENOUS
  Filled 2017-09-29: qty 100

## 2017-09-29 MED ORDER — SODIUM CHLORIDE 0.9% FLUSH
3.0000 mL | Freq: Two times a day (BID) | INTRAVENOUS | Status: DC
  Administered 2017-09-29 – 2017-09-30 (×2): 3 mL via INTRAVENOUS

## 2017-09-29 MED ORDER — ASPIRIN 81 MG PO CHEW
324.0000 mg | CHEWABLE_TABLET | Freq: Once | ORAL | Status: AC
  Administered 2017-09-29: 324 mg via ORAL
  Filled 2017-09-29: qty 4

## 2017-09-29 MED ORDER — ONDANSETRON HCL 4 MG/2ML IJ SOLN: 4.0000 mg | Freq: Four times a day (QID) | INTRAMUSCULAR | Status: DC | PRN

## 2017-09-29 MED ORDER — ONDANSETRON HCL 4 MG PO TABS: 4.0000 mg | ORAL_TABLET | Freq: Four times a day (QID) | ORAL | Status: DC | PRN

## 2017-09-29 MED ORDER — VITAMIN D 1000 UNITS PO TABS
1000.0000 [IU] | ORAL_TABLET | Freq: Every day | ORAL | Status: DC
  Administered 2017-09-30: 1000 [IU] via ORAL
  Filled 2017-09-29: qty 1

## 2017-09-29 MED ORDER — METOPROLOL TARTRATE 25 MG PO TABS
25.0000 mg | ORAL_TABLET | Freq: Two times a day (BID) | ORAL | Status: DC
Start: 2017-09-29 — End: 2017-09-30
  Administered 2017-09-29: 25 mg via ORAL
  Filled 2017-09-29: qty 1

## 2017-09-29 MED ORDER — LEVOTHYROXINE SODIUM 88 MCG PO TABS
88.0000 ug | ORAL_TABLET | Freq: Every day | ORAL | Status: DC
  Administered 2017-09-30: 88 ug via ORAL
  Filled 2017-09-29: qty 1

## 2017-09-29 MED ORDER — DILTIAZEM HCL 100 MG IV SOLR
5.0000 mg/h | INTRAVENOUS | Status: DC
  Administered 2017-09-29: 5 mg/h via INTRAVENOUS
  Filled 2017-09-29: qty 100

## 2017-09-29 MED ORDER — ASPIRIN 81 MG PO CHEW
81.0000 mg | CHEWABLE_TABLET | Freq: Every day | ORAL | Status: DC
  Administered 2017-09-30: 81 mg via ORAL
  Filled 2017-09-29: qty 1

## 2017-09-29 MED ORDER — DULOXETINE HCL 30 MG PO CPEP
60.0000 mg | ORAL_CAPSULE | Freq: Every day | ORAL | Status: DC
  Administered 2017-09-29: 60 mg via ORAL
  Filled 2017-09-29: qty 2

## 2017-09-29 MED ORDER — DILTIAZEM HCL 30 MG PO TABS
60.0000 mg | ORAL_TABLET | Freq: Three times a day (TID) | ORAL | Status: DC
  Administered 2017-09-29: 60 mg via ORAL
  Filled 2017-09-29 (×2): qty 2

## 2017-09-29 MED ORDER — PRAVASTATIN SODIUM 40 MG PO TABS
40.0000 mg | ORAL_TABLET | Freq: Every day | ORAL | Status: DC
  Administered 2017-09-29: 40 mg via ORAL
  Filled 2017-09-29: qty 1

## 2017-09-29 MED ORDER — OCUVITE-LUTEIN PO CAPS
1.0000 | ORAL_CAPSULE | Freq: Every day | ORAL | Status: DC
  Administered 2017-09-30: 1 via ORAL
  Filled 2017-09-29: qty 1

## 2017-09-29 MED ORDER — ACETAMINOPHEN 650 MG RE SUPP: 650.0000 mg | Freq: Four times a day (QID) | RECTAL | Status: DC | PRN

## 2017-09-29 MED ORDER — DILTIAZEM LOAD VIA INFUSION: 10.0000 mg | Freq: Once | INTRAVENOUS | Status: DC

## 2017-09-29 MED ORDER — PANTOPRAZOLE SODIUM 40 MG PO TBEC
40.0000 mg | DELAYED_RELEASE_TABLET | Freq: Every day | ORAL | Status: DC
  Administered 2017-09-29 – 2017-09-30 (×2): 40 mg via ORAL
  Filled 2017-09-29: qty 1

## 2017-09-29 NOTE — ED Notes (Signed)
Date and time results received: 09/29/17 1505 (use smartphrase ".now" to insert current time)  Test: Troponin Critical Value: 0.03  Name of Provider Notified: Dr Corky Downs  Orders Received? Or Actions Taken?: Notified

## 2017-09-29 NOTE — Progress Notes (Signed)
Patient's heart rate is in the 50's. Notified Dr.Maier about patient's current condition and discontinuation of cardizem drip. Duane Boston gave orders to change cardizem concentration to 30mg  instead of 60mg .

## 2017-09-29 NOTE — Telephone Encounter (Signed)
FYI

## 2017-09-29 NOTE — Telephone Encounter (Signed)
Pt called with some chest tightness and some shortness of breath. Which started around 11am . Pt was able to take a shower, get dress and take her pet outside this morning.  She states her pain gets worse with exertion.She had not had this before. Near the end of the questions, she stated that she just needed to be seen. And that Dr. Nicki Reaper would know what to do. Her neighbor, Murray Hodgkins was put on the phone and she felt like the EMS needed to be call. I advised her to call the 911 and she stated that she would.  Answer Assessment - Initial Assessment Questions 1. LOCATION: "Where does it hurt?"       Center going to the right above the breast 2. RADIATION: "Does the pain go anywhere else?" (e.g., into neck, jaw, arms, back)     no 3. ONSET: "When did the chest pain begin?" (Minutes, hours or days)      This morning around 11am 4. PATTERN "Does the pain come and go, or has it been constant since it started?"  "Does it get worse with exertion?"      Gets worse with exertion, move 5. DURATION: "How long does it last" (e.g., seconds, minutes, hours)     A few minutes 6. SEVERITY: "How bad is the pain?"  (e.g., Scale 1-10; mild, moderate, or severe)    - MILD (1-3): doesn't interfere with normal activities     - MODERATE (4-7): interferes with normal activities or awakens from sleep    - SEVERE (8-10): excruciating pain, unable to do any normal activities       moderate 7. CARDIAC RISK FACTORS: "Do you have any history of heart problems or risk factors for heart disease?" (e.g., prior heart attack, angina; high blood pressure, diabetes, being overweight, high cholesterol, smoking, or strong family history of heart disease)     Elevated pulse 119. Has afib, high cholesterol, sl overweight, family hx of heart disease 8. PULMONARY RISK FACTORS: "Do you have any history of lung disease?"  (e.g., blood clots in lung, asthma, emphysema, birth control pills)     no 9. CAUSE: "What do you think is causing the  chest pain?"     no 10. OTHER SYMPTOMS: "Do you have any other symptoms?" (e.g., dizziness, nausea, vomiting, sweating, fever, difficulty breathing, cough)       Difficulty breathing, cough 11. PREGNANCY: "Is there any chance you are pregnant?" "When was your last menstrual period?"       n/a  Protocols used: CHEST PAIN-A-AH

## 2017-09-29 NOTE — ED Triage Notes (Signed)
EMS stated patient converted as soon as they drew up the adenosine, prior to administering

## 2017-09-29 NOTE — Plan of Care (Signed)
Patient is currently bradycardic will continue to monitor for rate changes. Educate patient about checking heart rate at home.

## 2017-09-29 NOTE — ED Provider Notes (Signed)
[facility] Emergency Department Provider Note   ____________________________________________    I have reviewed the triage vital signs and the nursing notes.   HISTORY  Chief Complaint     HPI Kerri Mills is a 84 y.o. female who presents with chest pain.  Patient reports this morning after taking a shower she developed chest tightness and some mild shortness of breath.  She reports this worsened as she exerted herself by walking the dog and she became concerned and try to take her blood pressure but had difficulty with her machine.  She went to her neighbor's and by the time she got there she was quite short of breath.  Neighbors called EMS.  She reports she has never felt like this before.  She continues to have chest tightness.  She does have a history of atrial fibrillation and is on Coumadin for this.  No recent travel.  No calf pain or swelling.   Past Medical History:  Diagnosis Date  . Allergy   . Anxiety   . Arthritis   . Breast CA ([place])   . Breast cancer ([place]) 2011   LT LUMPECTOMY  . Colitis   . Depression   . Diverticulitis 2013  . Gastric ulcer   . GERD (gastroesophageal reflux disease)   . Heart disease   . Hypercholesterolemia   . Hypertension   . Infiltrating lobular carcinoma of left breast 2011   T2,N0, ER: 90%; PR 0%; Her 2 neu not amplified. [facility]).  . Melanoma ([place]) 1997  . Melanoma in situ of upper extremity ([place]) 03/19/2011  . Personal history of radiation therapy 2011   BREAST CA  . Seroma    HISTORY OF LFT BREAST  . Sleep apnea   . Thyroid cancer [facility]) 1992    Patient Active Problem List   Diagnosis Date Noted  . Dermatitis 09/26/2017  . Right leg swelling 05/25/2017  . Joint ache 05/25/2017  . Hypercholesterolemia 02/17/2017  . Cough 01/23/2017  . Flu-like symptoms 01/02/2017  . Hyperglycemia 06/18/2016  . Viral upper respiratory infection 04/20/2016  . Abnormal mammogram of left breast  01/21/2015  . Knee pain, left 11/19/2014  . Sore neck 10/11/2014  . Diverticulosis of colon without hemorrhage 05/07/2014  . History of breast cancer 01/20/2014  . Stress 06/15/2013  . Obstructive sleep apnea 06/15/2013  . Atrial fibrillation ([place]) 06/15/2013  . Posttraumatic hematoma of left breast 02/19/2013  . Lump or mass in breast 02/19/2013  . Infiltrating lobular carcinoma of left breast 03/19/2011  . Melanoma in situ of upper extremity ([place]) 03/19/2011  . Thyroid cancer ([place]) 03/19/2011  . GERD (gastroesophageal reflux disease)   . GERD 05/26/2009    Past Surgical History:  Procedure Laterality Date  . ABDOMINAL HYSTERECTOMY  1973   partial  . BREAST BIOPSY Left 02-13-13   BENIGN BREAST TISSUE WITH CHANGES CONSISTENT WITH FAT NECROSIS  . BREAST BIOPSY Left 01/21/2015   bx done in [facility] 11:00 left 6-8cmfn  . BREAST EXCISIONAL BIOPSY Left 1995   neg  . BREAST EXCISIONAL BIOPSY Left 2011   Breast cancer radiation  . BREAST LUMPECTOMY Left 2011   BREAST CA  . CHOLECYSTECTOMY    . COLONOSCOPY  2013  . MELANOMA EXCISION     RT UPPER ARM  . PARTIAL HYSTERECTOMY     bleeding, ovaries in place.    . THYROID SURGERY  1992   FOR THYROID CANCER  . TONSILLECTOMY  Prior to Admission medications   Medication Sig Start Date End Date Taking? Authorizing Provider  albuterol (PROAIR HFA) 108 (90 Base) MCG/ACT inhaler Inhale 2 puffs into the lungs every 6 (six) hours as needed for wheezing or shortness of breath. 01/10/17   Einar Pheasant, MD  Cholecalciferol (VITAMIN D) 1000 UNITS capsule Take 1,000 Units by mouth daily.      [provider]  DULoxetine (CYMBALTA) 60 MG capsule TAKE 1 CAPSULE (60 MG TOTAL) BY MOUTH AT BEDTIME. 06/08/17   Einar Pheasant, MD  fluticasone (FLONASE) 50 MCG/ACT nasal spray PLACE 2 SPRAYS INTO BOTH NOSTRILS DAILY. 05/24/17   Einar Pheasant, MD  gabapentin (NEURONTIN) 300 MG capsule TAKE 2 CAPSULES (600 MG TOTAL) BY MOUTH AT BEDTIME.  06/08/17   Einar Pheasant, MD  levothyroxine (SYNTHROID, LEVOTHROID) 88 MCG tablet TAKE ONE TABLET BY MOUTH ONCE DAILY BEFORE BREAKFAST 01/02/17   Einar Pheasant, MD  losartan-hydrochlorothiazide (HYZAAR) 100-12.5 MG tablet TAKE 1 TABLET BY MOUTH DAILY. 06/26/17   Einar Pheasant, MD  lovastatin (MEVACOR) 40 MG tablet TAKE 1 TABLET (40 MG TOTAL) BY MOUTH DAILY. 06/26/17   Einar Pheasant, MD  Multiple Vitamins-Minerals (PRESERVISION AREDS 2 PO) Take 2 tablets by mouth daily.      [provider]  omeprazole (PRILOSEC) 20 MG capsule TAKE 1 CAPSULE (20 MG TOTAL) BY MOUTH DAILY. 12/12/16   Einar Pheasant, MD  propranolol (INDERAL) 10 MG tablet TAKE 1 TABLET (10 MG TOTAL) BY MOUTH DAILY AS NEEDED. 08/03/17   Einar Pheasant, MD  sulfamethoxazole-trimethoprim (BACTRIM DS,SEPTRA DS) 800-160 MG tablet Take 1 tablet by mouth 2 (two) times daily. 09/14/17   Sankar, Andreas Newport, MD  triamcinolone (KENALOG) 0.025 % ointment Apply 1 application topically 2 (two) times daily. 09/14/17   Christene Lye, MD  warfarin (COUMADIN) 3 MG tablet Take 1 and 1/2 tablet on Wednesday and one tablet all other days of the week. Patient taking differently: Take 1 and 1/2 (4.5mg ) tablet on Wednesday & Sunday and 1 (3mg ) tablet all other days of the week. 03/15/16   Einar Pheasant, MD  warfarin (COUMADIN) 3 MG tablet TAKE 1 TABLET (3 MG TOTAL) BY MOUTH DAILY. 08/03/17   Einar Pheasant, MD     Allergies Atorvastatin; Lipitor [atorvastatin calcium]; and Penicillins  Family History  Problem Relation Age of Onset  . Heart disease Mother   . Cancer Brother        lung   . Cancer Sister        breast  . Breast cancer Neg Hx     Social History Social History   Tobacco Use  . Smoking status: Never Smoker  . Smokeless tobacco: Never Used  Substance Use Topics  . Alcohol use: Yes    Alcohol/week: 0.0 oz    Comment: social drinking. average times a week  . Drug use: No    Review of  Systems  Constitutional: No fever/chills Eyes: No visual changes.  ENT: No sore throat. Cardiovascular: As above Respiratory: As above Gastrointestinal: No abdominal pain.  No nausea, no vomiting.   Genitourinary: Negative for dysuria. Musculoskeletal: Negative for back pain. Skin: Negative for rash. Neurological: Negative for headaches   ____________________________________________   PHYSICAL EXAM:  VITAL SIGNS: ED Triage Vitals [09/29/17 1408]  Enc Vitals Group     BP      Pulse      Resp      Temp      Temp src      SpO2  Weight 83.4 kg (183 lb 14.4 oz)     Height 1.651 m (5\' 5" )     Head Circumference      Peak Flow      Pain Score      Pain Loc      Pain Edu?      Excl. in [place]?     Constitutional: Alert and oriented. No acute distress. Pleasant and interactive Eyes: Conjunctivae are normal.   Nose: No congestion/rhinnorhea. Mouth/Throat: Mucous membranes are moist.   Neck:  Painless ROM Cardiovascular: Tachycardia, irregularly irregular rhythm. Grossly normal heart sounds.  Good peripheral circulation. Respiratory: Normal respiratory effort.  No retractions.  Lungs clear to auscultation Gastrointestinal: Soft and nontender. No distention.   Genitourinary: deferred Musculoskeletal: No lower extremity tenderness nor edema.  Warm and well perfused Neurologic:  Normal speech and language. No gross focal neurologic deficits are appreciated.  Skin:  Skin is warm, dry and intact. No rash noted. Psychiatric: Mood and affect are normal. Speech and behavior are normal.  ____________________________________________   LABS (all labs ordered are listed, but only abnormal results are displayed)  Labs Reviewed  COMPREHENSIVE METABOLIC PANEL - Abnormal; Notable for the following components:      Result Value   Glucose, Bld 104 (*)    Total Protein 6.3 (*)    All other components within normal limits  TROPONIN I - Abnormal; Notable for the following components:    Troponin I 0.03 (*)    All other components within normal limits  PROTIME-INR - Abnormal; Notable for the following components:   Prothrombin Time 28.1 (*)    All other components within normal limits  CBC   ____________________________________________  EKG  ED ECG REPORT I, Lavonia Drafts, the attending physician, personally viewed and interpreted this ECG.  Date: 09/29/2017  Rhythm: A. fib QRS Axis: normal Intervals: Left bundle branch block ST/T Wave abnormalities: normal Narrative Interpretation: Atrial fibrillation with RVR  ____________________________________________  RADIOLOGY  Chest x-ray unremarkable ____________________________________________   PROCEDURES  Procedure(s) performed: No  Procedures   Critical Care performed: yes  CRITICAL CARE Performed by: Lavonia Drafts   Total critical care time: 30 minutes  Critical care time was exclusive of separately billable procedures and treating other patients.  Critical care was necessary to treat or prevent imminent or life-threatening deterioration.  Critical care was time spent personally by me on the following activities: development of treatment plan with patient and/or surrogate as well as nursing, discussions with consultants, evaluation of patient's response to treatment, examination of patient, obtaining history from patient or surrogate, ordering and performing treatments and interventions, ordering and review of laboratory studies, ordering and review of radiographic studies, pulse oximetry and re-evaluation of patient's condition.  ____________________________________________   INITIAL IMPRESSION / ASSESSMENT AND PLAN / ED COURSE  Pertinent labs & imaging results that were available during my care of the patient were reviewed by me and considered in my medical decision making (see chart for details).  Patient presents with chest pain, mild shortness of breath, worse with exertion.   Tachycardic, history of atrial fibrillation on Coumadin.  Differential diagnosis concerning for ACS, could be rate related heart strain related to atrial fibrillation.  Will check labs placed on the monitor, give Cardizem bolus and drip to reduce heart rate and carefully monitor  Patient with mildly elevated troponin. Chest pain has improved. Will admit to hospitalist for further management. No heparin at this time. Suspect rate related chest pain.  ____________________________________________   FINAL CLINICAL IMPRESSION(S) / ED DIAGNOSES  Final diagnoses:  Atrial fibrillation with rapid ventricular response (HCC)  Chest pain, unspecified type        Note:  This document was prepared using Dragon voice recognition software and may include unintentional dictation errors.    Lavonia Drafts, MD 09/29/17 [phone]

## 2017-09-29 NOTE — H&P (Signed)
[facility] [place] NAME: Kerri Mills    MR#:  [id number]  DATE OF BIRTH:  Jan 28, 1931  DATE OF ADMISSION:  09/29/2017  PRIMARY CARE PHYSICIAN: Einar Pheasant, MD   REQUESTING/REFERRING PHYSICIAN: Dr. Lavonia Drafts  CHIEF COMPLAINT:   Chief Complaint  Patient presents with  . Atrial Fibrillation    Hx A-FIB    HISTORY OF PRESENT ILLNESS:  Kerri Mills  is a 84 y.o. female with a known history of persistent atrial fibrillation on Coumadin, history of breast cancer status post lumpectomy and radiation, hypertension, hyperlipidemia, arthritis presents to the hospital secondary to worsening chest pain this morning. Patient has no prior cardiac history other than atrial fibrillation. Prior stress tests were normal and she even had a right heart ID a catheterization done when she was being treated for sleep apnea several years ago. Up until 2 nights ago she's never had any chest pain history. She had chest pain couple of nights ago, it was't very bad, so she just decided to rest and it resolved spontaneously. This morning she woke up, did not feel right. Was taking a shower and felt some heaviness in her chest and so sat down. She reached out for her blood pressure machine but he was recording a low pressure. So she went to her neighbor's house to get a blood pressure machine, she became very diaphoretic, dizzy and lightheaded and worsening chest heaviness. So the neighbor called 911. Her heart rate is elevated into the 120s here. She has chronic left bundle branch block. First troponin is at 0.03.  PAST MEDICAL HISTORY:   Past Medical History:  Diagnosis Date  . Allergy   . Anxiety   . Arthritis   . Breast CA ([place])   . Breast cancer ([place]) 2011   LT LUMPECTOMY  . Colitis   . Depression   . Diverticulitis 2013  . Gastric ulcer   . GERD (gastroesophageal reflux disease)   . Heart disease   . Hypercholesterolemia   . Hypertension   .  Infiltrating lobular carcinoma of left breast 2011   T2,N0, ER: 90%; PR 0%; Her 2 neu not amplified. [facility]).  . Melanoma ([place]) 1997  . Melanoma in situ of upper extremity ([place]) 03/19/2011  . Personal history of radiation therapy 2011   BREAST CA  . Seroma    HISTORY OF LFT BREAST  . Sleep apnea   . Thyroid cancer ([place]) 1992    PAST SURGICAL HISTORY:   Past Surgical History:  Procedure Laterality Date  . ABDOMINAL HYSTERECTOMY  1973   partial  . BREAST BIOPSY Left 02-13-13   BENIGN BREAST TISSUE WITH CHANGES CONSISTENT WITH FAT NECROSIS  . BREAST BIOPSY Left 01/21/2015   bx done in [facility] 11:00 left 6-8cmfn  . BREAST EXCISIONAL BIOPSY Left 1995   neg  . BREAST EXCISIONAL BIOPSY Left 2011   Breast cancer radiation  . BREAST LUMPECTOMY Left 2011   BREAST CA  . CHOLECYSTECTOMY    . COLONOSCOPY  2013  . MELANOMA EXCISION     RT UPPER ARM  . PARTIAL HYSTERECTOMY     bleeding, ovaries in place.    . THYROID SURGERY  1992   FOR THYROID CANCER  . TONSILLECTOMY      SOCIAL HISTORY:   Social History   Tobacco Use  . Smoking status: Never Smoker  . Smokeless tobacco: Never Used  Substance Use Topics  . Alcohol use: Yes  Alcohol/week: 0.0 oz    Comment: social drinking. average times a week    FAMILY HISTORY:   Family History  Problem Relation Age of Onset  . Heart disease Mother   . Cancer Brother        lung   . Cancer Sister        breast  . Breast cancer Neg Hx     DRUG ALLERGIES:   Allergies  Allergen Reactions  . Atorvastatin     Other reaction(s): Other (See Comments) STIFFNESS AND SORE STIFFNESS AND SORE  . Lipitor [Atorvastatin Calcium] Other (See Comments)    Stiffness & soreness  . Penicillins Rash    REACTION: Unknown reaction    REVIEW OF SYSTEMS:   Review of Systems  Constitutional: Positive for diaphoresis. Negative for chills, fever, malaise/fatigue and weight loss.  HENT: Negative for ear discharge, ear pain,  hearing loss, nosebleeds and tinnitus.   Eyes: Negative for blurred vision, double vision and photophobia.  Respiratory: Positive for shortness of breath. Negative for cough, hemoptysis and wheezing.   Cardiovascular: Positive for chest pain. Negative for palpitations, orthopnea and leg swelling.  Gastrointestinal: Negative for abdominal pain, constipation, diarrhea, heartburn, melena, nausea and vomiting.  Genitourinary: Negative for dysuria, frequency, hematuria and urgency.  Musculoskeletal: Negative for back pain, myalgias and neck pain.  Skin: Negative for rash.  Neurological: Negative for dizziness, tingling, tremors, sensory change, speech change, focal weakness and headaches.  Endo/Heme/Allergies: Does not bruise/bleed easily.  Psychiatric/Behavioral: Negative for depression.    MEDICATIONS AT HOME:   Prior to Admission medications   Medication Sig Start Date End Date Taking? Authorizing Provider  albuterol (PROAIR HFA) 108 (90 Base) MCG/ACT inhaler Inhale 2 puffs into the lungs every 6 (six) hours as needed for wheezing or shortness of breath. 01/10/17  Yes Einar Pheasant, MD  Cholecalciferol (VITAMIN D) 1000 UNITS capsule Take 1,000 Units by mouth daily.     Yes [provider]  DULoxetine (CYMBALTA) 60 MG capsule TAKE 1 CAPSULE (60 MG TOTAL) BY MOUTH AT BEDTIME. 06/08/17  Yes Einar Pheasant, MD  gabapentin (NEURONTIN) 300 MG capsule TAKE 2 CAPSULES (600 MG TOTAL) BY MOUTH AT BEDTIME. 06/08/17  Yes Einar Pheasant, MD  levothyroxine (SYNTHROID, LEVOTHROID) 88 MCG tablet TAKE ONE TABLET BY MOUTH ONCE DAILY BEFORE BREAKFAST 01/02/17  Yes Einar Pheasant, MD  losartan-hydrochlorothiazide (HYZAAR) 100-12.5 MG tablet TAKE 1 TABLET BY MOUTH DAILY. 06/26/17  Yes Einar Pheasant, MD  lovastatin (MEVACOR) 40 MG tablet TAKE 1 TABLET (40 MG TOTAL) BY MOUTH DAILY. 06/26/17  Yes Einar Pheasant, MD  Multiple Vitamins-Minerals (PRESERVISION AREDS 2 PO) Take 2 tablets by mouth daily.     Yes  [provider]  omeprazole (PRILOSEC) 20 MG capsule TAKE 1 CAPSULE (20 MG TOTAL) BY MOUTH DAILY. 12/12/16  Yes Einar Pheasant, MD  propranolol (INDERAL) 10 MG tablet TAKE 1 TABLET (10 MG TOTAL) BY MOUTH DAILY AS NEEDED. 08/03/17  Yes Einar Pheasant, MD  triamcinolone (KENALOG) 0.025 % ointment Apply 1 application topically 2 (two) times daily. 09/14/17  Yes Sankar, Seeplaputhur G, MD  warfarin (COUMADIN) 3 MG tablet TAKE 1 TABLET (3 MG TOTAL) BY MOUTH DAILY. Patient taking differently: Take by mouth as directed. Take 4.5MG  by mouth every Wednesday and 3MG  by mouth daily all other days 08/03/17  Yes Einar Pheasant, MD  fluticasone (FLONASE) 50 MCG/ACT nasal spray PLACE 2 SPRAYS INTO BOTH NOSTRILS DAILY. Patient not taking: Reported on 09/29/2017 05/24/17   Einar Pheasant, MD  sulfamethoxazole-trimethoprim (BACTRIM DS,SEPTRA  DS) 800-160 MG tablet Take 1 tablet by mouth 2 (two) times daily. Patient not taking: Reported on 09/29/2017 09/14/17   Christene Lye, MD      VITAL SIGNS:  Height 5\' 5"  (1.651 m), weight 83.4 kg (183 lb 14.4 oz).  PHYSICAL EXAMINATION:   Physical Exam  GENERAL:  84 y.o.-year-old elderly patient lying in the bed with no acute distress.  EYES: Pupils equal, round, reactive to light and accommodation. No scleral icterus. Extraocular muscles intact.  HEENT: Head atraumatic, normocephalic. Oropharynx and nasopharynx clear.  NECK:  Supple, no jugular venous distention. No thyroid enlargement, no tenderness.  LUNGS: Normal breath sounds bilaterally, no wheezing, rales,rhonchi or crepitation. No use of accessory muscles of respiration.  CARDIOVASCULAR: S1, S2 normal. No murmurs, rubs, or gallops.  ABDOMEN: Soft, nontender, nondistended. Bowel sounds present. No organomegaly or mass.  EXTREMITIES: No pedal edema, cyanosis, or clubbing.  NEUROLOGIC: Cranial nerves II through XII are intact. Muscle strength 5/5 in all extremities. Sensation intact. Gait not  checked.  PSYCHIATRIC: The patient is alert and oriented x 3.  SKIN: No obvious rash, lesion, or ulcer.   LABORATORY PANEL:   CBC Recent Labs  Lab 09/29/17 1416  WBC 6.5  HGB 12.3  HCT 37.7  PLT 248   ------------------------------------------------------------------------------------------------------------------  Chemistries  Recent Labs  Lab 09/29/17 1416  NA 140  K 3.9  CL 104  CO2 29  GLUCOSE 104*  BUN 14  CREATININE 0.79  CALCIUM 9.5  AST 24  ALT 15  ALKPHOS 101  BILITOT 0.4   ------------------------------------------------------------------------------------------------------------------  Cardiac Enzymes Recent Labs  Lab 09/29/17 1416  TROPONINI 0.03*   ------------------------------------------------------------------------------------------------------------------  RADIOLOGY:  Dg Chest Portable 1 View  Result Date: 09/29/2017 CLINICAL DATA:  Chest tightness and pressure.  Shortness of breath. EXAM: PORTABLE CHEST 1 VIEW COMPARISON:  01/17/2017 FINDINGS: The cardiac silhouette appears mildly enlarged, accentuated by portable AP technique. The lungs are mildly hypoinflated with likely minimal atelectasis in the left lung base. No confluent airspace opacity, overt edema, sizable pleural effusion, or pneumothorax is identified. Thoracic spondylosis is noted. IMPRESSION: Mild hypoinflation.  No evidence of pneumonia or edema. Electronically Signed   By: Logan Bores M.D.   On: 09/29/2017 14:49    EKG:   Orders placed or performed during the hospital encounter of 09/29/17  . EKG 12-Lead  . EKG 12-Lead  . ED EKG  . ED EKG    IMPRESSION AND PLAN:   Kerri Mills  is a 84 y.o. female with a known history of persistent atrial fibrillation on Coumadin, history of breast cancer status post lumpectomy and radiation, hypertension, hyperlipidemia, arthritis presents to the hospital secondary to worsening chest pain this morning.  1. Chest pain- sounds like  unstable angina. -Admit to telemetry. Cardiology consult. -Echocardiogram ordered. Recycle troponins. -Hold Coumadin, nitroglycerin. If troponins remain elevated, will start heparin drip. -NPO after midnight for either stress test or cardiac catheterization - start asa, on statin  2. Afib with rvr- flutter on EKG - oral metoprolol- if doesn't improve- will start drip - INR at 2.6 -hold coumadin now and will start heparin drip if needed  3. HTN- on metoprolol now  4. Neuropathy- on cymbalta, gabapentin  5. DVT Prophylaxis- coumadin on hold for today, INR at 2.6   All the records are reviewed and case discussed with ED provider. Management plans discussed with the patient, family and they are in agreement.  CODE STATUS: Full Code  TOTAL TIME TAKING CARE OF THIS PATIENT:  50 minutes.    Gladstone Lighter M.D on 09/29/2017 at 4:00 PM  Between 7am to 6pm - Pager - [phone]  After 6pm go to [email or web address] - password [email or web address]  Office  [phone]  CC: Primary care physician; Einar Pheasant, MD

## 2017-09-29 NOTE — Telephone Encounter (Signed)
Reviewed.  Pt in ER and plans for admission.

## 2017-09-29 NOTE — ED Notes (Signed)
Janett Billow Christmas RN called for report

## 2017-09-29 NOTE — ED Notes (Addendum)
Attempted to call report to [facility]

## 2017-09-30 ENCOUNTER — Encounter: Payer: Self-pay | Admitting: Radiology

## 2017-09-30 ENCOUNTER — Observation Stay
Admit: 2017-09-30 | Discharge: 2017-09-30 | Disposition: A | Discharge status: Home / Self Care | Payer: Medicare Other | Attending: Internal Medicine | Admitting: Internal Medicine

## 2017-09-30 ENCOUNTER — Observation Stay (HOSPITAL_BASED_OUTPATIENT_CLINIC_OR_DEPARTMENT_OTHER): Payer: Medicare Other

## 2017-09-30 DIAGNOSIS — E7800 Pure hypercholesterolemia, unspecified: Secondary | ICD-10-CM | POA: Diagnosis not present

## 2017-09-30 DIAGNOSIS — R079 Chest pain, unspecified: Secondary | ICD-10-CM

## 2017-09-30 DIAGNOSIS — I2489 Other forms of acute ischemic heart disease: Secondary | ICD-10-CM

## 2017-09-30 DIAGNOSIS — I4891 Unspecified atrial fibrillation: Secondary | ICD-10-CM

## 2017-09-30 DIAGNOSIS — I248 Other forms of acute ischemic heart disease: Secondary | ICD-10-CM

## 2017-09-30 DIAGNOSIS — R072 Precordial pain: Secondary | ICD-10-CM | POA: Diagnosis not present

## 2017-09-30 DIAGNOSIS — I481 Persistent atrial fibrillation: Secondary | ICD-10-CM

## 2017-09-30 DIAGNOSIS — I10 Essential (primary) hypertension: Secondary | ICD-10-CM

## 2017-09-30 DIAGNOSIS — M1990 Unspecified osteoarthritis, unspecified site: Secondary | ICD-10-CM | POA: Diagnosis not present

## 2017-09-30 LAB — CBC
HCT: 35.5 % (ref 35.0–47.0)
HEMOGLOBIN: 11.7 g/dL — AB (ref 12.0–16.0)
MCH: 29.3 pg (ref 26.0–34.0)
MCHC: 33 g/dL (ref 32.0–36.0)
MCV: 88.9 fL (ref 80.0–100.0)
PLATELETS: 227 10*3/uL (ref 150–440)
RBC: 3.99 MIL/uL (ref 3.80–5.20)
RDW: 14.5 % (ref 11.5–14.5)
WBC: 5.5 10*3/uL (ref 3.6–11.0)

## 2017-09-30 LAB — BASIC METABOLIC PANEL
ANION GAP: 8 (ref 5–15)
BUN: 19 mg/dL (ref 6–20)
CALCIUM: 9.1 mg/dL (ref 8.9–10.3)
CO2: 28 mmol/L (ref 22–32)
Chloride: 103 mmol/L (ref 101–111)
Creatinine, Ser: 0.86 mg/dL (ref 0.44–1.00)
Glucose, Bld: 119 mg/dL — ABNORMAL HIGH (ref 65–99)
Potassium: 3.6 mmol/L (ref 3.5–5.1)
SODIUM: 139 mmol/L (ref 135–145)

## 2017-09-30 LAB — NM MYOCAR MULTI W/SPECT W/WALL MOTION / EF
CHL CUP NUCLEAR SDS: 0
CHL CUP NUCLEAR SRS: 5
CHL CUP NUCLEAR SSS: 0
LV dias vol: 76 mL (ref 46–106)
LVSYSVOL: 21 mL
NUC STRESS TID: 1.15

## 2017-09-30 LAB — ECHOCARDIOGRAM COMPLETE
Height: 65 in
Weight: 2929.6 oz

## 2017-09-30 LAB — PROTIME-INR
INR: 2.57
Prothrombin Time: 27.4 seconds — ABNORMAL HIGH (ref 11.4–15.2)

## 2017-09-30 LAB — TROPONIN I

## 2017-09-30 MED ORDER — DILTIAZEM HCL ER COATED BEADS 120 MG PO CP24: 120.0000 mg | ORAL_CAPSULE | Freq: Every day | ORAL | 0 refills | Status: DC

## 2017-09-30 MED ORDER — TECHNETIUM TC 99M TETROFOSMIN IV KIT
33.6110 | PACK | Freq: Once | INTRAVENOUS | Status: AC | PRN
  Administered 2017-09-30: 33.611 via INTRAVENOUS

## 2017-09-30 MED ORDER — TECHNETIUM TC 99M TETROFOSMIN IV KIT
13.4810 | PACK | Freq: Once | INTRAVENOUS | Status: AC | PRN
  Administered 2017-09-30: 13.481 via INTRAVENOUS

## 2017-09-30 MED ORDER — ALBUTEROL SULFATE (2.5 MG/3ML) 0.083% IN NEBU
2.5000 mg | INHALATION_SOLUTION | Freq: Once | RESPIRATORY_TRACT | Status: AC
  Administered 2017-09-30: 2.5 mg via RESPIRATORY_TRACT
  Filled 2017-09-30: qty 3

## 2017-09-30 MED ORDER — REGADENOSON 0.4 MG/5ML IV SOLN
0.4000 mg | Freq: Once | INTRAVENOUS | Status: AC
  Administered 2017-09-30: 0.4 mg via INTRAVENOUS

## 2017-09-30 MED ORDER — DILTIAZEM HCL ER COATED BEADS 120 MG PO CP24
120.0000 mg | ORAL_CAPSULE | Freq: Every day | ORAL | Status: DC
  Administered 2017-09-30: 120 mg via ORAL
  Filled 2017-09-30: qty 1

## 2017-09-30 NOTE — Discharge Summary (Signed)
[facility] NAME: Kerri Mills    MR#:  [id number]  DATE OF BIRTH:  [birth year]  DATE OF ADMISSION:  09/29/2017 ADMITTING PHYSICIAN: Gladstone Lighter, MD  DATE OF DISCHARGE: 09/30/2016  PRIMARY CARE PHYSICIAN: Einar Pheasant, MD    ADMISSION DIAGNOSIS:  Atrial fibrillation with rapid ventricular response ([place]) [I48.91] Chest pain, unspecified type [R07.9]  DISCHARGE DIAGNOSIS:  Active Problems:   Pure hypercholesterolemia   Chest pain   Atrial fibrillation with rapid ventricular response ([place])   Demand ischemia ([place])   Essential hypertension   SECONDARY DIAGNOSIS:   Past Medical History:  Diagnosis Date  . Allergy   . Anxiety   . Arthritis   . Breast CA ([place])   . Breast cancer ([place]) 2011   LT LUMPECTOMY  . Colitis   . Depression   . Diverticulitis 2013  . Gastric ulcer   . GERD (gastroesophageal reflux disease)   . Heart disease   . Hypercholesterolemia   . Hypertension   . Infiltrating lobular carcinoma of left breast 2011   T2,N0, ER: 90%; PR 0%; Her 2 neu not amplified. [facility]).  . Melanoma ([place]) 1997  . Melanoma in situ of upper extremity ([place]) 03/19/2011  . Personal history of radiation therapy 2011   BREAST CA  . Seroma    HISTORY OF LFT BREAST  . Sleep apnea   . Thyroid cancer ([place]) 1992    HOSPITAL COURSE:   1.  Chest pain.  Cardiac enzymes x3-.  Stress test was a low risk study they did see an area that could be ischemia but likely breast attenuation.  Echocardiogram showed normal EF. 2.  Atrial fibrillation with rapid ventricular response.  Cardizem CD prescribed upon discharge home.  Patient therapeutic with Coumadin. 3.  Essential hypertension use Cardizem CD instead and hold losartan HCT 4.  Neuropathy on Cymbalta and gabapentin 5.  Hypothyroidism unspecified on levothyroxine  DISCHARGE CONDITIONS:   Satisfactory  CONSULTS OBTAINED:  Treatment Team:  Minna Merritts,  MD  DRUG ALLERGIES:   Allergies  Allergen Reactions  . Atorvastatin     Other reaction(s): Other (See Comments) STIFFNESS AND SORE STIFFNESS AND SORE  . Lipitor [Atorvastatin Calcium] Other (See Comments)    Stiffness & soreness  . Penicillins Rash    REACTION: Unknown reaction    DISCHARGE MEDICATIONS:   Allergies as of 09/30/2017      Reactions   Atorvastatin    Other reaction(s): Other (See Comments) STIFFNESS AND SORE STIFFNESS AND SORE   Lipitor [atorvastatin Calcium] Other (See Comments)   Stiffness & soreness   Penicillins Rash   REACTION: Unknown reaction      Medication List    STOP taking these medications   fluticasone 50 MCG/ACT nasal spray Commonly known as:  FLONASE   losartan-hydrochlorothiazide 100-12.5 MG tablet Commonly known as:  HYZAAR   sulfamethoxazole-trimethoprim 800-160 MG tablet Commonly known as:  BACTRIM DS,SEPTRA DS     TAKE these medications   albuterol 108 (90 Base) MCG/ACT inhaler Commonly known as:  PROAIR HFA Inhale 2 puffs into the lungs every 6 (six) hours as needed for wheezing or shortness of breath.   diltiazem 120 MG 24 hr capsule Commonly known as:  CARDIZEM CD Take 1 capsule (120 mg total) by mouth daily.   DULoxetine 60 MG capsule Commonly known as:  CYMBALTA TAKE 1 CAPSULE (60 MG TOTAL) BY MOUTH AT BEDTIME.   gabapentin 300 MG capsule Commonly  known as:  NEURONTIN TAKE 2 CAPSULES (600 MG TOTAL) BY MOUTH AT BEDTIME.   levothyroxine 88 MCG tablet Commonly known as:  SYNTHROID, LEVOTHROID TAKE ONE TABLET BY MOUTH ONCE DAILY BEFORE BREAKFAST   lovastatin 40 MG tablet Commonly known as:  MEVACOR TAKE 1 TABLET (40 MG TOTAL) BY MOUTH DAILY.   omeprazole 20 MG capsule Commonly known as:  PRILOSEC TAKE 1 CAPSULE (20 MG TOTAL) BY MOUTH DAILY.   PRESERVISION AREDS 2 PO Take 2 tablets by mouth daily.   propranolol 10 MG tablet Commonly known as:  INDERAL TAKE 1 TABLET (10 MG TOTAL) BY MOUTH DAILY AS NEEDED.    triamcinolone 0.025 % ointment Commonly known as:  KENALOG Apply 1 application topically 2 (two) times daily.   Vitamin D 1000 units capsule Take 1,000 Units by mouth daily.   warfarin 3 MG tablet Commonly known as:  COUMADIN TAKE 1 TABLET (3 MG TOTAL) BY MOUTH DAILY. What changed:    how much to take  when to take this  additional instructions        DISCHARGE INSTRUCTIONS:   Satisfactory  If you experience worsening of your admission symptoms, develop shortness of breath, life threatening emergency, suicidal or homicidal thoughts you must seek medical attention immediately by calling 911 or calling your MD immediately  if symptoms less severe.  You Must read complete instructions/literature along with all the possible adverse reactions/side effects for all the Medicines you take and that have been prescribed to you. Take any new Medicines after you have completely understood and accept all the possible adverse reactions/side effects.   Please note  You were cared for by a hospitalist during your hospital stay. If you have any questions about your discharge medications or the care you received while you were in the hospital after you are discharged, you can call the unit and asked to speak with the hospitalist on call if the hospitalist that took care of you is not available. Once you are discharged, your primary care physician will handle any further medical issues. Please note that NO REFILLS for any discharge medications will be authorized once you are discharged, as it is imperative that you return to your primary care physician (or establish a relationship with a primary care physician if you do not have one) for your aftercare needs so that they can reassess your need for medications and monitor your lab values.    Today   CHIEF COMPLAINT:   Chief Complaint  Patient presents with  . Atrial Fibrillation    Hx A-FIB    HISTORY OF PRESENT ILLNESS:  Kerri Mills  is  a 84 y.o. female with a known history of atrial fibrillation presents with chest pain and found to have fast heart rate   VITAL SIGNS:  Blood pressure (!) 145/76, pulse 70, temperature 97.6 F (36.4 C), temperature source Oral, resp. rate 16, height 5\' 5"  (1.651 m), weight 83.1 kg (183 lb 1.6 oz), SpO2 98 %.    PHYSICAL EXAMINATION:  GENERAL:  84 y.o.-year-old patient lying in the bed with no acute distress.  EYES: Pupils equal, round, reactive to light and accommodation. No scleral icterus. Extraocular muscles intact.  HEENT: Head atraumatic, normocephalic. Oropharynx and nasopharynx clear.  NECK:  Supple, no jugular venous distention. No thyroid enlargement, no tenderness.  LUNGS: Normal breath sounds bilaterally, no wheezing, rales,rhonchi or crepitation. No use of accessory muscles of respiration.  CARDIOVASCULAR: S1, S2 irregular irregular no murmurs, rubs, or gallops.  ABDOMEN: Soft,  non-tender, non-distended. Bowel sounds present. No organomegaly or mass.  EXTREMITIES: No pedal edema, cyanosis, or clubbing.  NEUROLOGIC: Cranial nerves II through XII are intact. Muscle strength 5/5 in all extremities. Sensation intact. Gait not checked.  PSYCHIATRIC: The patient is alert and oriented x 3.  SKIN: No obvious rash, lesion, or ulcer.   DATA REVIEW:   CBC Recent Labs  Lab 09/30/17 0502  WBC 5.5  HGB 11.7*  HCT 35.5  PLT 227    Chemistries  Recent Labs  Lab 09/29/17 1416 09/30/17 0502  NA 140 139  K 3.9 3.6  CL 104 103  CO2 29 28  GLUCOSE 104* 119*  BUN 14 19  CREATININE 0.79 0.86  CALCIUM 9.5 9.1  AST 24  --   ALT 15  --   ALKPHOS 101  --   BILITOT 0.4  --     Cardiac Enzymes Recent Labs  Lab 09/30/17 0502  TROPONINI <0.03    Microbiology Results  Results for orders placed or performed during the hospital encounter of 12/16/09  MRSA PCR Screening     Status: None   Collection Time: 12/17/09  4:56 AM  Result Value Ref Range Status   MRSA by PCR  NEGATIVE NEGATIVE Final    RADIOLOGY:  Nm Myocar Multi W/spect W/wall Motion / Ef  Result Date: 09/30/2017  There was no ST segment deviation noted during stress.  No T wave inversion was noted during stress.  Defect 1: There is a small defect of moderate severity present in the apical septal and apex location.  This is a low risk study.  The left ventricular ejection fraction is normal (55-65%).  Cannot rule out very small area of apical ischemia. However, favor breast attenuation artifact, especially given normal systolic function in that region.    Dg Chest Portable 1 View  Result Date: 09/29/2017 CLINICAL DATA:  Chest tightness and pressure.  Shortness of breath. EXAM: PORTABLE CHEST 1 VIEW COMPARISON:  01/17/2017 FINDINGS: The cardiac silhouette appears mildly enlarged, accentuated by portable AP technique. The lungs are mildly hypoinflated with likely minimal atelectasis in the left lung base. No confluent airspace opacity, overt edema, sizable pleural effusion, or pneumothorax is identified. Thoracic spondylosis is noted. IMPRESSION: Mild hypoinflation.  No evidence of pneumonia or edema. Electronically Signed   By: Logan Bores M.D.   On: 09/29/2017 14:49     Management plans discussed with the patient, family and they are in agreement.  CODE STATUS:     Code Status Orders  (From admission, onward)        Start     Ordered   09/29/17 1707  Full code  Continuous     09/29/17 1706    Code Status History    Date Active Date Inactive Code Status Order ID Comments User Context   This patient has a current code status but no historical code status.    Advance Directive Documentation     Most Recent Value  Type of Advance Directive  Healthcare Power of Attorney, Living will  Pre-existing out of facility DNR order (yellow form or pink MOST form)  No data  "MOST" Form in Place?  No data      TOTAL TIME TAKING CARE OF THIS PATIENT: 35 minutes.    Loletha Grayer M.D on  09/30/2017 at 1:59 PM  Between 7am to 6pm - Pager - [phone]  After 6pm go to [email or web address] - Proofreader  [employer] [facility] Office  [phone]  CC:  Primary care physician; Einar Pheasant, MD

## 2017-09-30 NOTE — Discharge Instructions (Signed)
Atrial Fibrillation Atrial fibrillation is a type of heartbeat that is irregular or fast (rapid). If you have this condition, your heart keeps quivering in a weird (chaotic) way. This condition can make it so your heart cannot pump blood normally. Having this condition gives a person more risk for stroke, heart failure, and other heart problems. There are different types of atrial fibrillation. Talk with your doctor to learn about the type that you have. Follow these instructions at home:  Take over-the-counter and prescription medicines only as told by your doctor.  If your doctor prescribed a blood-thinning medicine, take it exactly as told. Taking too much of it can cause bleeding. If you do not take enough of it, you will not have the protection that you need against stroke and other problems.  Do not use any tobacco products. These include cigarettes, chewing tobacco, and e-cigarettes. If you need help quitting, ask your doctor.  If you have apnea (obstructive sleep apnea), manage it as told by your doctor.  Do not drink alcohol.  Do not drink beverages that have caffeine. These include coffee, soda, and tea.  Maintain a healthy weight. Do not use diet pills unless your doctor says they are safe for you. Diet pills may make heart problems worse.  Follow diet instructions as told by your doctor.  Exercise regularly as told by your doctor.  Keep all follow-up visits as told by your doctor. This is important. Contact a doctor if:  You notice a change in the speed, rhythm, or strength of your heartbeat.  You are taking a blood-thinning medicine and you notice more bruising.  You get tired more easily when you move or exercise. Get help right away if:  You have pain in your chest or your belly (abdomen).  You have sweating or weakness.  You feel sick to your stomach (nauseous).  You notice blood in your throw up (vomit), poop (stool), or pee (urine).  You are short of  breath.  You suddenly have swollen feet and ankles.  You feel dizzy.  Your suddenly get weak or numb in your face, arms, or legs, especially if it happens on one side of your body.  You have trouble talking, trouble understanding, or both.  Your face or your eyelid droops on one side. These symptoms may be an emergency. Do not wait to see if the symptoms will go away. Get medical help right away. Call your local emergency services (911 in the [place] Do not drive yourself to the hospital. This information is not intended to replace advice given to you by your health care provider. Make sure you discuss any questions you have with your health care provider. Document Released: 06/21/2008 Document Revised: 02/18/2016 Document Reviewed: 01/07/2015 Elsevier Interactive Patient Education  2018 [employer]. Diltiazem extended-release capsules or tablets What is this medicine? DILTIAZEM (dil TYE a zem) is a calcium-channel blocker. It affects the amount of calcium found in your heart and muscle cells. This relaxes your blood vessels, which can reduce the amount of work the heart has to do. This medicine is used to treat high blood pressure and chest pain caused by angina. This medicine may be used for other purposes; ask your health care provider or pharmacist if you have questions. COMMON BRAND NAME(S): Cardizem CD, Cardizem LA, Cardizem SR, Cartia XT, Dilacor XR, Dilt-CD, Diltia XT, Diltzac, Matzim LA, Rema Fendt, Tiamate, Tiazac What should I tell my health care provider before I take this medicine? They need to know  if you have any of these conditions: -heart problems, low blood pressure, irregular heartbeat -liver disease -previous heart attack -an unusual or allergic reaction to diltiazem, other medicines, foods, dyes, or preservatives -pregnant or trying to get pregnant -breast-feeding How should I use this medicine? Take this medicine by mouth with a glass of water. Follow the directions  on the prescription label. Swallow whole, do not crush or chew. Ask your doctor or pharmacist if your should take this medicine with food. Take your doses at regular intervals. Do not take your medicine more often then directed. Do not stop taking except on the advice of your doctor or health care professional. Ask your doctor or health care professional how to gradually reduce the dose. Talk to your pediatrician regarding the use of this medicine in children. Special care may be needed. Overdosage: If you think you have taken too much of this medicine contact a poison control center or emergency room at once. NOTE: This medicine is only for you. Do not share this medicine with others. What if I miss a dose? If you miss a dose, take it as soon as you can. If it is almost time for your next dose, take only that dose. Do not take double or extra doses. What may interact with this medicine? Do not take this medicine with any of the following medications: -cisapride -hawthorn -pimozide -ranolazine -red yeast rice This medicine may also interact with the following medications: -buspirone -carbamazepine -cimetidine -cyclosporine -digoxin -local anesthetics or general anesthetics -lovastatin -medicines for anxiety or difficulty sleeping like midazolam and triazolam -medicines for high blood pressure or heart problems -quinidine -rifampin, rifabutin, or rifapentine This list may not describe all possible interactions. Give your health care provider a list of all the medicines, herbs, non-prescription drugs, or dietary supplements you use. Also tell them if you smoke, drink alcohol, or use illegal drugs. Some items may interact with your medicine. What should I watch for while using this medicine? Check your blood pressure and pulse rate regularly. Ask your doctor or health care professional what your blood pressure and pulse rate should be and when you should contact him or her. You may feel  dizzy or lightheaded. Do not drive, use machinery, or do anything that needs mental alertness until you know how this medicine affects you. To reduce the risk of dizzy or fainting spells, do not sit or stand up quickly, especially if you are an older patient. Alcohol can make you more dizzy or increase flushing and rapid heartbeats. Avoid alcoholic drinks. What side effects may I notice from receiving this medicine? Side effects that you should report to your doctor or health care professional as soon as possible: -allergic reactions like skin rash, itching or hives, swelling of the face, lips, or tongue -confusion, mental depression -feeling faint or lightheaded, falls -redness, blistering, peeling or loosening of the skin, including inside the mouth -slow, irregular heartbeat -swelling of the feet and ankles -unusual bleeding or bruising, pinpoint red spots on the skin Side effects that usually do not require medical attention (report to your doctor or health care professional if they continue or are bothersome): -constipation or diarrhea -difficulty sleeping -facial flushing -headache -nausea, vomiting -sexual dysfunction -weak or tired This list may not describe all possible side effects. Call your doctor for medical advice about side effects. You may report side effects to FDA at 1-800-FDA-1088. Where should I keep my medicine? Keep out of the reach of children. Store at room temperature between  15 and 30 degrees C (59 and 86 degrees F). Protect from humidity. Throw away any unused medicine after the expiration date. NOTE: This sheet is a summary. It may not cover all possible information. If you have questions about this medicine, talk to your doctor, pharmacist, or health care provider.  2018 Elsevier/Gold Standard (2008-01-03 14:35:47)

## 2017-09-30 NOTE — Consult Note (Signed)
Cardiology Consultation:   Patient ID: Kerri Mills; [id number]; November 19, 1930   Admit date: 09/29/2017 Date of Consult: 09/30/2017  Primary Care Provider: Einar Pheasant, MD Primary Cardiologist: new   Patient Profile:   Kerri Mills is a 84 y.o. female with persistent atrial fibrillation, hypertension, hyperlipidemia, LBBB, OSA, and breast cancer s/p lumpectomy and XRT,  who is being seen today for the evaluation of atrial fibrillation with RVR and chest pain at the request of Dr. Tressia Miners.  History of Present Illness:   Ms. Brockway noted that for the two days prior to admission she hadn't been feeling well.  She felt nauseous in the mornings and had chest pressure in the evenings.  On the day of admission she got in the shower and became very short of breath and tremulous.  She called EMS and was found to be in atrial fibrillation with rates in the 120s.  She was admitted at [facility] where she was in atrial fibrillation at 120 bpm.  LBBB was noted on EKG.  INR the day prior to admission was 3.1.  Troponin was mildly elevated and flat at 0.03.  She was started on a diltiazem infusion and cardiology was consulted.  She reported some orthopnea overnight.  She is feeling back to her baseline this AM.  She reports having an ETT many years ago that was negative for ischemia.     Past Medical History:  Diagnosis Date  . Allergy   . Anxiety   . Arthritis   . Breast CA ([place])   . Breast cancer ([place]) 2011   LT LUMPECTOMY  . Colitis   . Depression   . Diverticulitis 2013  . Gastric ulcer   . GERD (gastroesophageal reflux disease)   . Heart disease   . Hypercholesterolemia   . Hypertension   . Infiltrating lobular carcinoma of left breast 2011   T2,N0, ER: 90%; PR 0%; Her 2 neu not amplified. [facility]).  . Melanoma ([place]) 1997  . Melanoma in situ of upper extremity ([place]) 03/19/2011  . Personal history of radiation therapy 2011   BREAST CA  . Seroma    HISTORY OF LFT BREAST  . Sleep  apnea   . Thyroid cancer ([place]) 1992    Past Surgical History:  Procedure Laterality Date  . ABDOMINAL HYSTERECTOMY  1973   partial  . BREAST BIOPSY Left 02-13-13   BENIGN BREAST TISSUE WITH CHANGES CONSISTENT WITH FAT NECROSIS  . BREAST BIOPSY Left 01/21/2015   bx done in [facility] 11:00 left 6-8cmfn  . BREAST EXCISIONAL BIOPSY Left 1995   neg  . BREAST EXCISIONAL BIOPSY Left 2011   Breast cancer radiation  . BREAST LUMPECTOMY Left 2011   BREAST CA  . CHOLECYSTECTOMY    . COLONOSCOPY  2013  . MELANOMA EXCISION     RT UPPER ARM  . PARTIAL HYSTERECTOMY     bleeding, ovaries in place.    . THYROID SURGERY  1992   FOR THYROID CANCER  . TONSILLECTOMY       Home Medications:  Prior to Admission medications   Medication Sig Start Date End Date Taking? Authorizing Provider  albuterol (PROAIR HFA) 108 (90 Base) MCG/ACT inhaler Inhale 2 puffs into the lungs every 6 (six) hours as needed for wheezing or shortness of breath. 01/10/17  Yes Einar Pheasant, MD  Cholecalciferol (VITAMIN D) 1000 UNITS capsule Take 1,000 Units by mouth daily.     Yes [provider]  DULoxetine (CYMBALTA) 60 MG  capsule TAKE 1 CAPSULE (60 MG TOTAL) BY MOUTH AT BEDTIME. 06/08/17  Yes Einar Pheasant, MD  gabapentin (NEURONTIN) 300 MG capsule TAKE 2 CAPSULES (600 MG TOTAL) BY MOUTH AT BEDTIME. 06/08/17  Yes Einar Pheasant, MD  levothyroxine (SYNTHROID, LEVOTHROID) 88 MCG tablet TAKE ONE TABLET BY MOUTH ONCE DAILY BEFORE BREAKFAST 01/02/17  Yes Einar Pheasant, MD  losartan-hydrochlorothiazide (HYZAAR) 100-12.5 MG tablet TAKE 1 TABLET BY MOUTH DAILY. 06/26/17  Yes Einar Pheasant, MD  lovastatin (MEVACOR) 40 MG tablet TAKE 1 TABLET (40 MG TOTAL) BY MOUTH DAILY. 06/26/17  Yes Einar Pheasant, MD  Multiple Vitamins-Minerals (PRESERVISION AREDS 2 PO) Take 2 tablets by mouth daily.     Yes [provider]  omeprazole (PRILOSEC) 20 MG capsule TAKE 1 CAPSULE (20 MG TOTAL) BY MOUTH DAILY. 12/12/16  Yes  Einar Pheasant, MD  propranolol (INDERAL) 10 MG tablet TAKE 1 TABLET (10 MG TOTAL) BY MOUTH DAILY AS NEEDED. 08/03/17  Yes Einar Pheasant, MD  triamcinolone (KENALOG) 0.025 % ointment Apply 1 application topically 2 (two) times daily. 09/14/17  Yes Sankar, Seeplaputhur G, MD  warfarin (COUMADIN) 3 MG tablet TAKE 1 TABLET (3 MG TOTAL) BY MOUTH DAILY. Patient taking differently: Take by mouth as directed. Take 4.5MG  by mouth every Wednesday and 3MG  by mouth daily all other days 08/03/17  Yes Einar Pheasant, MD  fluticasone (FLONASE) 50 MCG/ACT nasal spray PLACE 2 SPRAYS INTO BOTH NOSTRILS DAILY. Patient not taking: Reported on 09/29/2017 05/24/17   Einar Pheasant, MD  sulfamethoxazole-trimethoprim (BACTRIM DS,SEPTRA DS) 800-160 MG tablet Take 1 tablet by mouth 2 (two) times daily. Patient not taking: Reported on 09/29/2017 09/14/17   Christene Lye, MD    Inpatient Medications: Scheduled Meds: . aspirin  81 mg Oral Daily  . cholecalciferol  1,000 Units Oral Daily  . diltiazem  60 mg Oral Q8H  . DULoxetine  60 mg Oral QHS  . gabapentin  600 mg Oral QHS  . levothyroxine  88 mcg Oral QAC breakfast  . metoprolol tartrate  25 mg Oral BID  . multivitamin-lutein  1 capsule Oral Daily  . pantoprazole  40 mg Oral Daily  . pravastatin  40 mg Oral q1800  . sodium chloride flush  3 mL Intravenous Q12H   Continuous Infusions: . diltiazem (CARDIZEM) infusion Stopped (09/29/17 2200)   PRN Meds: acetaminophen **OR** acetaminophen, metoprolol tartrate, ondansetron **OR** ondansetron (ZOFRAN) IV  Allergies:    Allergies  Allergen Reactions  . Atorvastatin     Other reaction(s): Other (See Comments) STIFFNESS AND SORE STIFFNESS AND SORE  . Lipitor [Atorvastatin Calcium] Other (See Comments)    Stiffness & soreness  . Penicillins Rash    REACTION: Unknown reaction    Social History:   Social History   Socioeconomic History  . Marital status: Widowed    Spouse name: Not on file  .  Number of children: Not on file  . Years of education: Not on file  . Highest education level: Not on file  Social Needs  . Financial resource strain: Not on file  . Food insecurity - worry: Not on file  . Food insecurity - inability: Not on file  . Transportation needs - medical: Not on file  . Transportation needs - non-medical: Not on file  Occupational History  . Not on file  Tobacco Use  . Smoking status: Never Smoker  . Smokeless tobacco: Never Used  Substance and Sexual Activity  . Alcohol use: Yes    Alcohol/week: 0.0 oz  Comment: social drinking. average times a week  . Drug use: No  . Sexual activity: No  Other Topics Concern  . Not on file  Social History Narrative   Independent and baseline. Lives by herself    Family History:    Family History  Problem Relation Age of Onset  . Heart disease Mother   . Cancer Brother        lung   . Cancer Sister        breast  . Breast cancer Neg Hx      ROS:  Please see the history of present illness.  ROS  All other ROS reviewed and negative.     Physical Exam/Data:   Vitals:   09/29/17 1941 09/30/17 0005 09/30/17 0319 09/30/17 0754  BP: (!) 92/55 115/67 (!) 109/54 (!) 145/76  Pulse: 73 (!) 53 (!) 50 70  Resp: 18   16  Temp: 98.2 F (36.8 C)  97.9 F (36.6 C) 97.6 F (36.4 C)  TempSrc: Oral  Oral Oral  SpO2: 95%  97% 98%  Weight:      Height:        Intake/Output Summary (Last 24 hours) at 09/30/2017 1610 Last data filed at 09/30/2017 0843 Gross per 24 hour  Intake 23.42 ml  Output 900 ml  Net -876.58 ml   Filed Weights   09/29/17 1408 09/29/17 1704  Weight: 183 lb 14.4 oz (83.4 kg) 183 lb 1.6 oz (83.1 kg)   GENERAL:  Well appearing HEENT: Pupils equal round and reactive, fundi not visualized, oral mucosa unremarkable NECK:  No jugular venous distention, waveform within normal limits, carotid upstroke brisk and symmetric, no bruits, no thyromegaly LUNGS:  Clear to auscultation bilaterally HEART:   Irregularly irregular.  PMI not displaced or sustained,S1 and S2 within normal limits, no S3, no S4, no clicks, no rubs, no murmurs ABD:  Flat, positive bowel sounds normal in frequency in pitch, no bruits, no rebound, no guarding, no midline pulsatile mass, no hepatomegaly, no splenomegaly EXT:  2 plus pulses throughout, no edema, no cyanosis no clubbing SKIN:  No rashes no nodules NEURO:  Cranial nerves II through XII grossly intact, motor grossly intact throughout PSYCH:  Cognitively intact, oriented to person place and time   EKG:  The EKG was personally reviewed and demonstrates: Atrial fibrillation. Rate 120 bpm.  LBBB.   Telemetry:  Telemetry was personally reviewed and demonstrates: Atrial fibrillation  Relevant CV Studies:  Echo pending Lexiscan Myoview pending  Laboratory Data:  Chemistry Recent Labs  Lab 09/29/17 1416 09/30/17 0502  NA 140 139  K 3.9 3.6  CL 104 103  CO2 29 28  GLUCOSE 104* 119*  BUN 14 19  CREATININE 0.79 0.86  CALCIUM 9.5 9.1  GFRNONAA >60 >60  GFRAA >60 >60  ANIONGAP 7 8    Recent Labs  Lab 09/29/17 1416  PROT 6.3*  ALBUMIN 3.6  AST 24  ALT 15  ALKPHOS 101  BILITOT 0.4   Hematology Recent Labs  Lab 09/29/17 1416 09/30/17 0502  WBC 6.5 5.5  RBC 4.26 3.99  HGB 12.3 11.7*  HCT 37.7 35.5  MCV 88.5 88.9  MCH 29.0 29.3  MCHC 32.7 33.0  RDW 14.0 14.5  PLT 248 227   Cardiac Enzymes Recent Labs  Lab 09/29/17 1416 09/29/17 1737 09/29/17 2309 09/30/17 0502  TROPONINI 0.03* 0.03* <0.03 <0.03   No results for input(s): TROPIPOC in the last 168 hours.  BNPNo results for input(s): BNP, PROBNP in  the last 168 hours.  DDimer No results for input(s): DDIMER in the last 168 hours.  Radiology/Studies:  Dg Chest Portable 1 View  Result Date: 09/29/2017 CLINICAL DATA:  Chest tightness and pressure.  Shortness of breath. EXAM: PORTABLE CHEST 1 VIEW COMPARISON:  01/17/2017 FINDINGS: The cardiac silhouette appears mildly enlarged,  accentuated by portable AP technique. The lungs are mildly hypoinflated with likely minimal atelectasis in the left lung base. No confluent airspace opacity, overt edema, sizable pleural effusion, or pneumothorax is identified. Thoracic spondylosis is noted. IMPRESSION: Mild hypoinflation.  No evidence of pneumonia or edema. Electronically Signed   By: Logan Bores M.D.   On: 09/29/2017 14:49    Assessment and Plan:   # Persistent atrial fibrillation with RVR:  Ms. Nappi remains in atrial fibrillation this AM.  Rates are much better-controlled.  She was transitioned from a diltiazem infusion 2 tablets overnight.  She developed some bradycardia and her evening and morning doses of diltiazem were held.  She was also started on metoprolol this admission.  Prior to hospitalization she was not on any nodal agents.  We will discontinue metoprolol.  Continue warfarin for anticoagulation.  # Chest pain: Troponin mildly elevated to 0.03.  Likely 2/2 demand ischemia in the setting of afib with RVR.  Will get Lexiscan Myoview this AM.  Echo is pending as well.  # Hypertension: Her home hydrochlorothiazide, losartan, and propranolol are on hold.  She was previously taking propranolol as needed.  #Hyperlipidemia: Continue pravastatin.  LDL 97 this admission.  For questions or updates, please contact [place] Please consult [email or web address] for contact info under Cardiology/STEMI.   Signed, Skeet Latch, MD  09/30/2017 9:22 AM

## 2017-09-30 NOTE — Progress Notes (Signed)
Patient is complaining of chest discomfort denies pain but complains of shortness of breath. Pulse ox is 97% on room air. Heart rate fluctuates between 40's and 50's. Notified Dr.Pyredd of patient's condition. Given orders for SVN treatment.

## 2017-09-30 NOTE — Progress Notes (Signed)
Patient discharged via wheelchair and private vehicle. Tele box off and returned. VSS no distress noted at time of DC

## 2017-10-02 ENCOUNTER — Telehealth: Payer: Self-pay | Admitting: Internal Medicine

## 2017-10-02 NOTE — Telephone Encounter (Signed)
Transition Care Management Follow-up Telephone Call  How have you been since you were released from the hospital? Feels awfully tired, said her rate really got high and she feels scared.   Do you understand why you were in the hospital? yes   Do you understand the discharge instrcutions? yes  Items Reviewed:  Medications reviewed: yes  Allergies reviewed: yes  Dietary changes reviewed: yes  Referrals reviewed: yes   Functional Questionnaire:   Activities of Daily Living (ADLs):   She states they are independent in the following: ambulation, bathing and hygiene, feeding, continence, grooming and toileting States they require assistance with the following: No assistance needed   Any transportation issues/concerns?: no   Any patient concerns? no   Confirmed importance and date/time of follow-up visits scheduled: yes   Confirmed with patient if condition begins to worsen call PCP or go to the ER.  Patient was given the Call-a-Nurse line [phone]: yes

## 2017-10-02 NOTE — Telephone Encounter (Signed)
Please advise 

## 2017-10-02 NOTE — Telephone Encounter (Signed)
I can see her 10/11/17 at 12:30.  If any problems with this time, let me know.

## 2017-10-02 NOTE — Telephone Encounter (Signed)
Patient really seemed scared and concerned her heart rate getting so high she says now she feels anything she gets nervous and shaky. FYI, Need Appointment date and time

## 2017-10-02 NOTE — Telephone Encounter (Signed)
Copied from [place]. Topic: Appointment Scheduling - Scheduling Inquiry for Clinic >> Oct 02, 2017 11:04 AM Kerri Mills, NT wrote: Reason for CRM: Patient was hospitalized this weekend  for Afib,and she needs to be seen before her appointment on 10/19/17 can you fit her in Dr Nicki Reaper is completely full please advise [phone] or [phone]

## 2017-10-02 NOTE — Telephone Encounter (Signed)
Left message to return call  To office [facility] may give appt time and date.

## 2017-10-03 NOTE — Telephone Encounter (Signed)
Patient aware of HFU time and date.

## 2017-10-07 NOTE — Progress Notes (Signed)
Cardiology Office Note  Date:  10/10/2017   ID:  Kerri Mills, DOB Jan 27, 1931, MRN [id number]  PCP:  Einar Pheasant, MD   Chief Complaint  Patient presents with  . other    Afib and edema ankles. Meds reviewed verbally with pt.    HPI:  Kerri Mills is a 84 y.o. female with  persistent atrial fibrillation, on anticoagulation hypertension,  hyperlipidemia,  LBBB,  OSA,   breast cancer s/p lumpectomy and XRT,   Hospital admission September 30, 2017 for chest pain Evaluated by cardiology for atrial fibrillation with RVR and chest pain  She presents today for follow-up after recent hospitalization, follow-up of her chest pain and A. Fib  Seen in the hospital 09/30/2017 hadn't been feeling well.   nauseous in the mornings and had chest pressure in the evenings.    very short of breath and tremulous.   called EMS and was found to be in atrial fibrillation with rates in the 120s.   admitted at [facility] where she was in atrial fibrillation at 120 bpm.   LBBB was noted on EKG.   INR the day prior to admission was 3.1.    She was started on a diltiazem infusion  orthopnea overnight.  Bradycardia on diltiazem and metoprolol   She is feeling back to her baseline Back in NSR  EKG personally reviewed by myself on todays visit Shows NSR,  LBBB is resolved  She reports having an ETT many years ago that was negative for ischemia.      PMH:   has a past medical history of Allergy, Anxiety, Arthritis, Breast CA ([place]), Breast cancer ([place]) (2011), Clotting disorder ([place]), Colitis, Depression, Diverticulitis (2013), Gastric ulcer, GERD (gastroesophageal reflux disease), Heart disease, Hypercholesterolemia, Hypertension, Infiltrating lobular carcinoma of left breast (2011), Melanoma ([place]) (1997), Melanoma in situ of upper extremity ([place]) (03/19/2011), Personal history of radiation therapy (2011), Seroma, Sleep apnea, and Thyroid cancer ([place]) (1992).  PSH:    Past Surgical History:  Procedure  Laterality Date  . ABDOMINAL HYSTERECTOMY  1973   partial  . BREAST BIOPSY Left 02-13-13   BENIGN BREAST TISSUE WITH CHANGES CONSISTENT WITH FAT NECROSIS  . BREAST BIOPSY Left 01/21/2015   bx done in [facility] 11:00 left 6-8cmfn  . BREAST EXCISIONAL BIOPSY Left 1995   neg  . BREAST EXCISIONAL BIOPSY Left 2011   Breast cancer radiation  . BREAST LUMPECTOMY Left 2011   BREAST CA  . CARDIAC CATHETERIZATION    . CHOLECYSTECTOMY    . COLONOSCOPY  2013  . MELANOMA EXCISION     RT UPPER ARM  . PARTIAL HYSTERECTOMY     bleeding, ovaries in place.    . THYROID SURGERY  1992   FOR THYROID CANCER  . TONSILLECTOMY      Current Outpatient Medications  Medication Sig Dispense Refill  . albuterol (PROAIR HFA) 108 (90 Base) MCG/ACT inhaler Inhale 2 puffs into the lungs every 6 (six) hours as needed for wheezing or shortness of breath. 1 Inhaler 3  . Cholecalciferol (VITAMIN D) 1000 UNITS capsule Take 1,000 Units by mouth daily.      Marland Kitchen diltiazem (CARDIZEM CD) 120 MG 24 hr capsule Take 1 capsule (120 mg total) by mouth daily. 30 capsule 0  . DULoxetine (CYMBALTA) 60 MG capsule TAKE 1 CAPSULE (60 MG TOTAL) BY MOUTH AT BEDTIME. 90 capsule 1  . gabapentin (NEURONTIN) 300 MG capsule TAKE 2 CAPSULES (600 MG TOTAL) BY MOUTH AT BEDTIME. 180 capsule 1  .  levothyroxine (SYNTHROID, LEVOTHROID) 88 MCG tablet TAKE ONE TABLET BY MOUTH ONCE DAILY BEFORE BREAKFAST 90 tablet 3  . lovastatin (MEVACOR) 40 MG tablet TAKE 1 TABLET (40 MG TOTAL) BY MOUTH DAILY. 90 tablet 2  . Multiple Vitamins-Minerals (PRESERVISION AREDS 2 PO) Take 2 tablets by mouth daily.      Marland Kitchen omeprazole (PRILOSEC) 20 MG capsule TAKE 1 CAPSULE (20 MG TOTAL) BY MOUTH DAILY. 90 capsule 3  . propranolol (INDERAL) 10 MG tablet TAKE 1 TABLET (10 MG TOTAL) BY MOUTH DAILY AS NEEDED. 90 tablet 1  . triamcinolone (KENALOG) 0.025 % ointment Apply 1 application topically 2 (two) times daily. 30 g 0  . warfarin (COUMADIN) 3 MG tablet TAKE 1 TABLET (3 MG  TOTAL) BY MOUTH DAILY. (Patient taking differently: Take by mouth as directed. Take 4.5MG  by mouth every Wednesday and 3MG  by mouth daily all other days) 90 tablet 1   No current facility-administered medications for this visit.      Allergies:   Atorvastatin; Lipitor [atorvastatin calcium]; and Penicillins   Social History:  The patient  reports that  has never smoked. she has never used smokeless tobacco. She reports that she drinks alcohol. She reports that she does not use drugs.   Family History:   family history includes Cancer in her brother and sister; Heart disease in her mother.    Review of Systems: Review of Systems  Constitutional: Negative.   Respiratory: Negative.   Cardiovascular: Negative.   Gastrointestinal: Negative.   Musculoskeletal: Negative.   Neurological: Positive for tremors.  Psychiatric/Behavioral: Negative.   All other systems reviewed and are negative.    PHYSICAL EXAM: VS:  BP 122/70 (BP Location: Right Arm, Patient Position: Sitting, Cuff Size: Normal)   Pulse 69   Ht 5\' 5"  (1.651 m)   Wt 185 lb (83.9 kg)   BMI 30.79 kg/m  , BMI Body mass index is 30.79 kg/m. GEN: Well nourished, well developed, in no acute distress  HEENT: normal  Neck: no JVD, carotid bruits, or masses Cardiac: RRR; no murmurs, rubs, or gallops,no edema  Respiratory:  clear to auscultation bilaterally, normal work of breathing GI: soft, nontender, nondistended, + BS MS: no deformity or atrophy  Skin: warm and dry, no rash Neuro:  Strength and sensation are intact Psych: euthymic mood, full affect    Recent Labs: 02/16/2017: TSH 1.30 09/29/2017: ALT 15 09/30/2017: BUN 19; Creatinine, Ser 0.86; Hemoglobin 11.7; Platelets 227; Potassium 3.6; Sodium 139    Lipid Panel Lab Results  Component Value Date   CHOL 186 09/29/2017   HDL 35 (L) 09/29/2017   LDLCALC 97 09/29/2017   TRIG 268 (H) 09/29/2017      Wt Readings from Last 3 Encounters:  10/10/17 185 lb (83.9 kg)   09/29/17 183 lb 1.6 oz (83.1 kg)  09/25/17 183 lb (83 kg)       ASSESSMENT AND PLAN:  Persistent atrial fibrillation ([place]) - Plan: EKG 12-Lead Follow-up from hospital  converted back to normal sinus rhythm We'll continue current medications, on warfarin  Essential hypertension - Plan: EKG 12-Lead Blood pressure stable Losartan previously held to add diltiazem  Pure hypercholesterolemia - Plan: EKG 12-Lead Cholesterol is at goal on the current lipid regimen. No changes to the medications were made.  Long discussion concerning recent hospitalization  Total encounter time more than 45 minutes  Greater than 50% was spent in counseling and coordination of care with the patient   Disposition:   F/U  6 months  Orders Placed This Encounter  Procedures  . EKG 12-Lead     Signed, Esmond Plants, M.D., Ph.D. 10/10/2017  [place], [place]

## 2017-10-10 ENCOUNTER — Encounter: Payer: Self-pay | Admitting: Cardiovascular Disease

## 2017-10-10 ENCOUNTER — Telehealth: Payer: Self-pay | Admitting: Cardiovascular Disease

## 2017-10-10 ENCOUNTER — Ambulatory Visit (INDEPENDENT_AMBULATORY_CARE_PROVIDER_SITE_OTHER): Payer: Medicare Other | Admitting: Cardiovascular Disease

## 2017-10-10 VITALS — BP 122/70 | HR 69 | Ht 65.0 in | Wt 185.0 lb

## 2017-10-10 DIAGNOSIS — I10 Essential (primary) hypertension: Secondary | ICD-10-CM

## 2017-10-10 DIAGNOSIS — I248 Other forms of acute ischemic heart disease: Secondary | ICD-10-CM | POA: Diagnosis not present

## 2017-10-10 DIAGNOSIS — E7800 Pure hypercholesterolemia, unspecified: Secondary | ICD-10-CM

## 2017-10-10 DIAGNOSIS — I4819 Other persistent atrial fibrillation: Secondary | ICD-10-CM

## 2017-10-10 DIAGNOSIS — I481 Persistent atrial fibrillation: Secondary | ICD-10-CM

## 2017-10-10 NOTE — Telephone Encounter (Signed)
Patient wants to know what the Cost of an ov for Coumadin check would be at [facility] instead of at PCP office   Please advise

## 2017-10-10 NOTE — Patient Instructions (Signed)

## 2017-10-10 NOTE — Telephone Encounter (Signed)
Returned patient's call re Coumadin clinic visit cost.  Left voice mail and my contact # should she have further questions.

## 2017-10-11 ENCOUNTER — Encounter: Payer: Self-pay | Admitting: Internal Medicine

## 2017-10-11 ENCOUNTER — Ambulatory Visit (INDEPENDENT_AMBULATORY_CARE_PROVIDER_SITE_OTHER): Payer: Medicare Other | Admitting: Internal Medicine

## 2017-10-11 VITALS — BP 160/90 | HR 68 | Temp 98.4°F | Resp 16 | Wt 186.0 lb

## 2017-10-11 DIAGNOSIS — I481 Persistent atrial fibrillation: Secondary | ICD-10-CM | POA: Diagnosis not present

## 2017-10-11 DIAGNOSIS — R739 Hyperglycemia, unspecified: Secondary | ICD-10-CM

## 2017-10-11 DIAGNOSIS — D649 Anemia, unspecified: Secondary | ICD-10-CM

## 2017-10-11 DIAGNOSIS — K219 Gastro-esophageal reflux disease without esophagitis: Secondary | ICD-10-CM | POA: Diagnosis not present

## 2017-10-11 DIAGNOSIS — C73 Malignant neoplasm of thyroid gland: Secondary | ICD-10-CM | POA: Diagnosis not present

## 2017-10-11 DIAGNOSIS — E7800 Pure hypercholesterolemia, unspecified: Secondary | ICD-10-CM

## 2017-10-11 DIAGNOSIS — I10 Essential (primary) hypertension: Secondary | ICD-10-CM

## 2017-10-11 DIAGNOSIS — I4819 Other persistent atrial fibrillation: Secondary | ICD-10-CM

## 2017-10-11 DIAGNOSIS — I4891 Unspecified atrial fibrillation: Secondary | ICD-10-CM

## 2017-10-11 LAB — HEPATIC FUNCTION PANEL
ALBUMIN: 4 g/dL (ref 3.5–5.2)
ALT: 11 U/L (ref 0–35)
AST: 17 U/L (ref 0–37)
Alkaline Phosphatase: 108 U/L (ref 39–117)
Bilirubin, Direct: 0.1 mg/dL (ref 0.0–0.3)
TOTAL PROTEIN: 6.9 g/dL (ref 6.0–8.3)
Total Bilirubin: 0.4 mg/dL (ref 0.2–1.2)

## 2017-10-11 LAB — BASIC METABOLIC PANEL
BUN: 12 mg/dL (ref 6–23)
CALCIUM: 9.9 mg/dL (ref 8.4–10.5)
CO2: 32 mEq/L (ref 19–32)
CREATININE: 0.75 mg/dL (ref 0.40–1.20)
Chloride: 103 mEq/L (ref 96–112)
GFR: 78.19 mL/min (ref >60.00)
Glucose, Bld: 100 mg/dL — ABNORMAL HIGH (ref 70–99)
Potassium: 4.4 mEq/L (ref 3.5–5.1)
SODIUM: 139 meq/L (ref 135–145)

## 2017-10-11 LAB — PROTIME-INR
INR: 2.4 ratio — AB (ref 0.8–1.0)
PROTHROMBIN TIME: 26.1 s — AB (ref 9.6–13.1)

## 2017-10-11 LAB — CBC WITH DIFFERENTIAL/PLATELET
BASOS ABS: 0.1 10*3/uL (ref 0.0–0.1)
Basophils Relative: 1.1 % (ref 0.0–3.0)
EOS ABS: 0.1 10*3/uL (ref 0.0–0.7)
Eosinophils Relative: 2.8 % (ref 0.0–5.0)
HCT: 35.7 % — ABNORMAL LOW (ref 36.0–46.0)
Hemoglobin: 11.5 g/dL — ABNORMAL LOW (ref 12.0–15.0)
LYMPHS ABS: 1.5 10*3/uL (ref 0.7–4.0)
LYMPHS PCT: 29.4 % (ref 12.0–46.0)
MCHC: 32.2 g/dL (ref 30.0–36.0)
MCV: 90.3 fl (ref 78.0–100.0)
Monocytes Absolute: 0.6 10*3/uL (ref 0.1–1.0)
Monocytes Relative: 11.3 % (ref 3.0–12.0)
NEUTROS ABS: 2.9 10*3/uL (ref 1.4–7.7)
NEUTROS PCT: 55.4 % (ref 43.0–77.0)
PLATELETS: 241 10*3/uL (ref 150.0–400.0)
RBC: 3.95 Mil/uL (ref 3.87–5.11)
RDW: 13.9 % (ref 11.5–15.5)
WBC: 5.3 10*3/uL (ref 4.0–10.5)

## 2017-10-11 LAB — FERRITIN: Ferritin: 6.8 ng/mL — ABNORMAL LOW (ref 10.0–291.0)

## 2017-10-11 LAB — TSH: TSH: 3.4 u[IU]/mL (ref 0.35–4.50)

## 2017-10-11 NOTE — Progress Notes (Signed)
Pre visit review using our clinic review tool, if applicable. No additional management support is needed unless otherwise documented below in the visit note. 

## 2017-10-11 NOTE — Progress Notes (Signed)
Patient ID: Kerri Mills, female   DOB: 07/05/1931, 84 y.o.   MRN: [id number]   Subjective:    Patient ID: Kerri Mills, female    DOB: 10/02/1930, 84 y.o.   MRN: [id number]  HPI  Patient here for a hospital follow up.  She was admitted 10/09/17 with chest pain and increased heart rate.  Found to have afib with RVR.  Evaluated by cardiology.  Had stress test as outlined.  ECHO - normal EF.  Was placed on cardizem.  Already on coumadin.  States since discharge, she has felt better.  No increased heart rate.  Breathing better.  Eating.  No acid reflux.  No abdominal pain.  Bowels moving.     Past Medical History:  Diagnosis Date  . Allergy   . Anxiety   . Arthritis   . Breast CA (HCC)   . Breast cancer (HCC) 2011   LT LUMPECTOMY  . Clotting disorder (HCC)   . Colitis   . Depression   . Diverticulitis 2013  . Gastric ulcer   . GERD (gastroesophageal reflux disease)   . Heart disease   . Hypercholesterolemia   . Hypertension   . Infiltrating lobular carcinoma of left breast 2011   T2,N0, ER: 90%; PR 0%; Her 2 neu not amplified. ([facility]).  . Melanoma (HCC) 1997  . Melanoma in situ of upper extremity (HCC) 03/19/2011  . Personal history of radiation therapy 2011   BREAST CA  . Seroma    HISTORY OF LFT BREAST  . Sleep apnea   . Thyroid cancer (HCC) 1992   Past Surgical History:  Procedure Laterality Date  . ABDOMINAL HYSTERECTOMY  1973   partial  . BREAST BIOPSY Left 02-13-13   BENIGN BREAST TISSUE WITH CHANGES CONSISTENT WITH FAT NECROSIS  . BREAST BIOPSY Left 01/21/2015   bx done in [facility] 11:00 left 6-8cmfn  . BREAST EXCISIONAL BIOPSY Left 1995   neg  . BREAST EXCISIONAL BIOPSY Left 2011   Breast cancer radiation  . BREAST LUMPECTOMY Left 2011   BREAST CA  . CARDIAC CATHETERIZATION    . CHOLECYSTECTOMY    . COLONOSCOPY  2013  . MELANOMA EXCISION     RT UPPER ARM  . PARTIAL HYSTERECTOMY     bleeding, ovaries in place.    . THYROID SURGERY  1992   FOR THYROID CANCER  . TONSILLECTOMY     Family History  Problem Relation Age of Onset  . Heart disease Mother   . Cancer Brother        lung   . Cancer Sister        breast  . Breast cancer Neg Hx    Social History   Socioeconomic History  . Marital status: Widowed    Spouse name: None  . Number of children: None  . Years of education: None  . Highest education level: None  Social Needs  . Financial resource strain: None  . Food insecurity - worry: None  . Food insecurity - inability: None  . Transportation needs - medical: None  . Transportation needs - non-medical: None  Occupational History  . None  Tobacco Use  . Smoking status: Never Smoker  . Smokeless tobacco: Never Used  Substance and Sexual Activity  . Alcohol use: Yes    Alcohol/week: 0.0 oz    Comment: social drinking. average times a week  . Drug use: No  . Sexual activity: No  Other Topics Concern  . None    Social History Narrative   Independent and baseline. Lives by herself    Outpatient Encounter Medications as of 10/11/2017  Medication Sig  . albuterol (PROAIR HFA) 108 (90 Base) MCG/ACT inhaler Inhale 2 puffs into the lungs every 6 (six) hours as needed for wheezing or shortness of breath.  . Cholecalciferol (VITAMIN D) 1000 UNITS capsule Take 1,000 Units by mouth daily.    . diltiazem (CARDIZEM CD) 120 MG 24 hr capsule Take 1 capsule (120 mg total) by mouth daily.  . DULoxetine (CYMBALTA) 60 MG capsule TAKE 1 CAPSULE (60 MG TOTAL) BY MOUTH AT BEDTIME.  . gabapentin (NEURONTIN) 300 MG capsule TAKE 2 CAPSULES (600 MG TOTAL) BY MOUTH AT BEDTIME.  . levothyroxine (SYNTHROID, LEVOTHROID) 88 MCG tablet TAKE ONE TABLET BY MOUTH ONCE DAILY BEFORE BREAKFAST  . lovastatin (MEVACOR) 40 MG tablet TAKE 1 TABLET (40 MG TOTAL) BY MOUTH DAILY.  . Multiple Vitamins-Minerals (PRESERVISION AREDS 2 PO) Take 2 tablets by mouth daily.    . omeprazole (PRILOSEC) 20 MG capsule TAKE 1 CAPSULE (20 MG TOTAL) BY MOUTH DAILY.    . propranolol (INDERAL) 10 MG tablet TAKE 1 TABLET (10 MG TOTAL) BY MOUTH DAILY AS NEEDED.  . triamcinolone (KENALOG) 0.025 % ointment Apply 1 application topically 2 (two) times daily.  . warfarin (COUMADIN) 3 MG tablet TAKE 1 TABLET (3 MG TOTAL) BY MOUTH DAILY. (Patient taking differently: Take by mouth as directed. Take 4.5MG by mouth every Wednesday and 3MG by mouth daily all other days)   No facility-administered encounter medications on file as of 10/11/2017.     Review of Systems  Constitutional: Negative for appetite change and unexpected weight change.  HENT: Negative for congestion and sinus pressure.   Respiratory: Negative for cough and chest tightness.        Breathing better.   Cardiovascular: Negative for chest pain, palpitations and leg swelling.  Gastrointestinal: Negative for abdominal pain, diarrhea, nausea and vomiting.  Genitourinary: Negative for difficulty urinating and dysuria.  Musculoskeletal: Negative for back pain and joint swelling.  Skin: Negative for color change and rash.  Neurological: Negative for dizziness, light-headedness and headaches.  Psychiatric/Behavioral: Negative for agitation and dysphoric mood.       Objective:    Physical Exam  Constitutional: She appears well-developed and well-nourished. No distress.  HENT:  Nose: Nose normal.  Mouth/Throat: Oropharynx is clear and moist.  Neck: Neck supple. No thyromegaly present.  Cardiovascular: Normal rate and regular rhythm.  Pulmonary/Chest: Breath sounds normal. No respiratory distress. She has no wheezes.  Abdominal: Soft. Bowel sounds are normal. There is no tenderness.  Musculoskeletal: She exhibits no edema or tenderness.  Lymphadenopathy:    She has no cervical adenopathy.  Skin: No rash noted. No erythema.  Psychiatric: She has a normal mood and affect. Her behavior is normal.    BP (!) 160/90 (BP Location: Right Arm, Cuff Size: Normal)   Pulse 68   Temp 98.4 F (36.9 C) (Oral)    Resp 16   Wt 186 lb (84.4 kg)   SpO2 94%   BMI 30.95 kg/m  Wt Readings from Last 3 Encounters:  10/11/17 186 lb (84.4 kg)  10/10/17 185 lb (83.9 kg)  09/29/17 183 lb 1.6 oz (83.1 kg)     Lab Results  Component Value Date   WBC 5.3 10/11/2017   HGB 11.5 (L) 10/11/2017   HCT 35.7 (L) 10/11/2017   PLT 241.0 10/11/2017   GLUCOSE 100 (H) 10/11/2017   CHOL 186 09/29/2017     TRIG 268 (H) 09/29/2017   HDL 35 (L) 09/29/2017   LDLDIRECT 75.0 03/24/2017   LDLCALC 97 09/29/2017   ALT 11 10/11/2017   AST 17 10/11/2017   NA 139 10/11/2017   K 4.4 10/11/2017   CL 103 10/11/2017   CREATININE 0.75 10/11/2017   BUN 12 10/11/2017   CO2 32 10/11/2017   TSH 3.40 10/11/2017   INR 2.4 (H) 10/11/2017   HGBA1C 6.6 (H) 06/30/2017    Nm Myocar Multi W/spect W/wall Motion / Ef  Result Date: 09/30/2017  There was no ST segment deviation noted during stress.  No T wave inversion was noted during stress.  Defect 1: There is a small defect of moderate severity present in the apical septal and apex location.  This is a low risk study.  The left ventricular ejection fraction is normal (55-65%).  Cannot rule out very small area of apical ischemia. However, favor breast attenuation artifact, especially given normal systolic function in that region.        Assessment & Plan:   Problem List Items Addressed This Visit    Atrial fibrillation (HCC)    On coumadin.  Recently admitted with afib/RVR.  On cardizem.  No increased heart rate since her discharge.  Breathing better.  F/u with cardiology.        Atrial fibrillation with rapid ventricular response (HCC)   Relevant Orders   Protime-INR (Completed)   TSH (Completed)   Essential hypertension    Blood pressure elevated.  Recheck improved today.  Have her spot check her pressure.  Follow.  May need to add losartan back to her regimen.        Relevant Orders   Basic metabolic panel (Completed)   GERD (gastroesophageal reflux disease)     Controlled on current regimen.        Hyperglycemia    Low carb diet and exercise.  Follow met b and a1c.       Pure hypercholesterolemia - Primary   Relevant Orders   Hepatic function panel (Completed)   Thyroid cancer (HCC)    S/p XRT post op.  On thyroid replacement.  Follow tsh.         Other Visit Diagnoses    Anemia, unspecified type       Relevant Orders   CBC with Differential/Platelet (Completed)   Ferritin (Completed)       SCOTT, CHARLENE, MD  

## 2017-10-12 DIAGNOSIS — M170 Bilateral primary osteoarthritis of knee: Secondary | ICD-10-CM | POA: Diagnosis not present

## 2017-10-12 DIAGNOSIS — M810 Age-related osteoporosis without current pathological fracture: Secondary | ICD-10-CM | POA: Diagnosis not present

## 2017-10-14 NOTE — Assessment & Plan Note (Signed)
On coumadin.  Recently admitted with afib/RVR.  On cardizem.  No increased heart rate since her discharge.  Breathing better.  F/u with cardiology.

## 2017-10-14 NOTE — Assessment & Plan Note (Signed)
Blood pressure elevated.  Recheck improved today.  Have her spot check her pressure.  Follow.  May need to add losartan back to her regimen.

## 2017-10-14 NOTE — Assessment & Plan Note (Signed)
Controlled on current regimen.   

## 2017-10-14 NOTE — Assessment & Plan Note (Signed)
S/p XRT post op.  On thyroid replacement.  Follow tsh.

## 2017-10-14 NOTE — Assessment & Plan Note (Signed)
Low carb diet and exercise.  Follow met b and a1c.  

## 2017-10-19 ENCOUNTER — Ambulatory Visit: Payer: Medicare Other | Admitting: Internal Medicine

## 2017-10-26 ENCOUNTER — Ambulatory Visit (INDEPENDENT_AMBULATORY_CARE_PROVIDER_SITE_OTHER): Payer: Medicare Other | Admitting: General Surgery

## 2017-10-26 ENCOUNTER — Other Ambulatory Visit: Payer: Self-pay | Admitting: *Deleted

## 2017-10-26 ENCOUNTER — Telehealth: Payer: Self-pay | Admitting: Cardiovascular Disease

## 2017-10-26 ENCOUNTER — Encounter: Payer: Self-pay | Admitting: General Surgery

## 2017-10-26 VITALS — BP 122/76 | HR 73 | Resp 14 | Ht 65.0 in | Wt 182.0 lb

## 2017-10-26 DIAGNOSIS — L309 Dermatitis, unspecified: Secondary | ICD-10-CM | POA: Diagnosis not present

## 2017-10-26 MED ORDER — DILTIAZEM HCL ER COATED BEADS 120 MG PO CP24: 120.0000 mg | ORAL_CAPSULE | Freq: Every day | ORAL | 3 refills | Status: DC

## 2017-10-26 NOTE — Telephone Encounter (Signed)
Requested Prescriptions   Signed Prescriptions Disp Refills  . diltiazem (CARDIZEM CD) 120 MG 24 hr capsule 90 capsule 3    Sig: Take 1 capsule (120 mg total) by mouth daily.    Authorizing Provider: Minna Merritts    Ordering User: Britt Bottom

## 2017-10-26 NOTE — Patient Instructions (Signed)
The patient is aware to call back for any questions or concerns.  

## 2017-10-26 NOTE — Progress Notes (Signed)
Patient ID: Kerri Mills, female   DOB: 03-27-1931, 84 y.o.   MRN: [id number]  Chief Complaint  Patient presents with  . Follow-up    HPI Kerri Mills is a 84 y.o. female.  Here for her one month follow up dermatitis left breast. She states the area is better. She did go to the ED first part of Jan for A Fib.  HPI  Past Medical History:  Diagnosis Date  . Allergy   . Anxiety   . Arthritis   . Breast CA ([place])   . Breast cancer ([place]) 2011   LT LUMPECTOMY  . Clotting disorder ([place])   . Colitis   . Depression   . Diverticulitis 2013  . Gastric ulcer   . GERD (gastroesophageal reflux disease)   . Heart disease   . Hypercholesterolemia   . Hypertension   . Infiltrating lobular carcinoma of left breast 2011   T2,N0, ER: 90%; PR 0%; Her 2 neu not amplified. [facility]).  . Melanoma ([place]) 1997  . Melanoma in situ of upper extremity ([place]) 03/19/2011  . Personal history of radiation therapy 2011   BREAST CA  . Seroma    HISTORY OF LFT BREAST  . Sleep apnea   . Thyroid cancer ([place]) 1992    Past Surgical History:  Procedure Laterality Date  . ABDOMINAL HYSTERECTOMY  1973   partial  . BREAST BIOPSY Left 02-13-13   BENIGN BREAST TISSUE WITH CHANGES CONSISTENT WITH FAT NECROSIS  . BREAST BIOPSY Left 01/21/2015   bx done in [facility] 11:00 left 6-8cmfn  . BREAST EXCISIONAL BIOPSY Left 1995   neg  . BREAST EXCISIONAL BIOPSY Left 2011   Breast cancer radiation  . BREAST LUMPECTOMY Left 2011   BREAST CA  . CARDIAC CATHETERIZATION    . CHOLECYSTECTOMY    . COLONOSCOPY  2013  . MELANOMA EXCISION     RT UPPER ARM  . PARTIAL HYSTERECTOMY     bleeding, ovaries in place.    . THYROID SURGERY  1992   FOR THYROID CANCER  . TONSILLECTOMY      Family History  Problem Relation Age of Onset  . Heart disease Mother   . Cancer Brother        lung   . Cancer Sister        breast  . Breast cancer Neg Hx     Social History Social History   Tobacco Use  . Smoking  status: Never Smoker  . Smokeless tobacco: Never Used  Substance Use Topics  . Alcohol use: Yes    Alcohol/week: 0.0 oz    Comment: social drinking. average times a week  . Drug use: No    Allergies  Allergen Reactions  . Atorvastatin     Other reaction(s): Other (See Comments) STIFFNESS AND SORE STIFFNESS AND SORE  . Lipitor [Atorvastatin Calcium] Other (See Comments)    Stiffness & soreness  . Penicillins Rash    REACTION: Unknown reaction    Current Outpatient Medications  Medication Sig Dispense Refill  . albuterol (PROAIR HFA) 108 (90 Base) MCG/ACT inhaler Inhale 2 puffs into the lungs every 6 (six) hours as needed for wheezing or shortness of breath. 1 Inhaler 3  . Cholecalciferol (VITAMIN D) 1000 UNITS capsule Take 1,000 Units by mouth daily.      Marland Kitchen diltiazem (CARDIZEM CD) 120 MG 24 hr capsule Take 1 capsule (120 mg total) by mouth daily. 30 capsule 0  . DULoxetine (CYMBALTA) 60  MG capsule TAKE 1 CAPSULE (60 MG TOTAL) BY MOUTH AT BEDTIME. 90 capsule 1  . gabapentin (NEURONTIN) 300 MG capsule TAKE 2 CAPSULES (600 MG TOTAL) BY MOUTH AT BEDTIME. 180 capsule 1  . levothyroxine (SYNTHROID, LEVOTHROID) 88 MCG tablet TAKE ONE TABLET BY MOUTH ONCE DAILY BEFORE BREAKFAST 90 tablet 3  . Multiple Vitamins-Minerals (PRESERVISION AREDS 2 PO) Take 2 tablets by mouth daily.      Marland Kitchen omeprazole (PRILOSEC) 20 MG capsule TAKE 1 CAPSULE (20 MG TOTAL) BY MOUTH DAILY. 90 capsule 3  . propranolol (INDERAL) 10 MG tablet TAKE 1 TABLET (10 MG TOTAL) BY MOUTH DAILY AS NEEDED. 90 tablet 1  . triamcinolone (KENALOG) 0.025 % ointment Apply 1 application topically 2 (two) times daily. 30 g 0  . warfarin (COUMADIN) 3 MG tablet TAKE 1 TABLET (3 MG TOTAL) BY MOUTH DAILY. (Patient taking differently: Take by mouth as directed. Take 4.5MG  by mouth every Wednesday and 3MG  by mouth daily all other days) 90 tablet 1   No current facility-administered medications for this visit.     Review of Systems Review of  Systems  Constitutional: Negative.   Respiratory: Negative.   Cardiovascular: Negative.     Blood pressure 122/76, pulse 73, resp. rate 14, height 5\' 5"  (1.651 m), weight 182 lb (82.6 kg).  Physical Exam Physical Exam  Constitutional: She is oriented to person, place, and time. She appears well-developed and well-nourished.  Cardiovascular: Normal rate, regular rhythm and normal heart sounds.  Clinically in sinus rhythm today.  Pulmonary/Chest:    Dermatitis healed left breast  Neurological: She is alert and oriented to person, place, and time.  Skin: Skin is warm and dry.  Psychiatric: Her behavior is normal.       Assessment    Resolution of previous nipple changes.    Plan    Follow up as scheduled in April with bilateral mammograms at that time.     HPI, Physical Exam, Assessment and Plan have been scribed under the direction and in the presence of Robert Bellow, MD. Karie Fetch, RN  I have completed the exam and reviewed the above documentation for accuracy and completeness.  I agree with the above.  Haematologist has been used and any errors in dictation or transcription are unintentional.  Hervey Ard, M.D., F.A.C.S.  Forest Gleason Latorsha Curling 10/26/2017, 10:21 AM

## 2017-10-26 NOTE — Telephone Encounter (Signed)
°*  STAT* If patient is at the pharmacy, call can be transferred to refill team.   1. Which medications need to be refilled? (please list name of each medication and dose if known)  Diltiazem   2. Which pharmacy/location (including street and city if local pharmacy) is medication to be sent to? [facility] on [address]   3. Do they need a 30 day or 90 day supply?  90 day

## 2017-10-27 DIAGNOSIS — H353221 Exudative age-related macular degeneration, left eye, with active choroidal neovascularization: Secondary | ICD-10-CM | POA: Diagnosis not present

## 2017-11-09 ENCOUNTER — Ambulatory Visit (INDEPENDENT_AMBULATORY_CARE_PROVIDER_SITE_OTHER): Payer: Medicare Other | Admitting: Internal Medicine

## 2017-11-09 ENCOUNTER — Encounter: Payer: Self-pay | Admitting: Internal Medicine

## 2017-11-09 VITALS — BP 134/76 | HR 83 | Temp 98.2°F | Resp 18 | Wt 183.8 lb

## 2017-11-09 DIAGNOSIS — K219 Gastro-esophageal reflux disease without esophagitis: Secondary | ICD-10-CM

## 2017-11-09 DIAGNOSIS — I4819 Other persistent atrial fibrillation: Secondary | ICD-10-CM

## 2017-11-09 DIAGNOSIS — I481 Persistent atrial fibrillation: Secondary | ICD-10-CM

## 2017-11-09 DIAGNOSIS — M25562 Pain in left knee: Secondary | ICD-10-CM

## 2017-11-09 DIAGNOSIS — M25561 Pain in right knee: Secondary | ICD-10-CM

## 2017-11-09 DIAGNOSIS — I10 Essential (primary) hypertension: Secondary | ICD-10-CM

## 2017-11-09 DIAGNOSIS — F439 Reaction to severe stress, unspecified: Secondary | ICD-10-CM | POA: Diagnosis not present

## 2017-11-09 LAB — PROTIME-INR
INR: 3 ratio — AB (ref 0.8–1.0)
Prothrombin Time: 31.6 s — ABNORMAL HIGH (ref 9.6–13.1)

## 2017-11-09 NOTE — Progress Notes (Signed)
Patient ID: Kerri Mills, female   DOB: 1931-05-12, 84 y.o.   MRN: [id number]   Subjective:    Patient ID: Kerri Mills, female    DOB: 1930-12-23, 84 y.o.   MRN: [id number]  HPI  Patient here for a scheduled follow up.  She reports she is doing better.  Feels better.  Stays active.  No chest pain.  Breathing stable.  No acid reflux.  No abdominal pain.  Bowels moving. Still with knee pain.  Saw rheumatology.  S/p injection.  Recommended f/u in 4 months.  Desires no further intervention at this time.  States blood pressures are averaging <865 systolic.     Past Medical History:  Diagnosis Date  . Allergy   . Anxiety   . Arthritis   . Breast CA ([place])   . Breast cancer ([place]) 2011   LT LUMPECTOMY  . Clotting disorder ([place])   . Colitis   . Depression   . Diverticulitis 2013  . Gastric ulcer   . GERD (gastroesophageal reflux disease)   . Heart disease   . Hypercholesterolemia   . Hypertension   . Infiltrating lobular carcinoma of left breast 2011   T2,N0, ER: 90%; PR 0%; Her 2 neu not amplified. [facility]).  . Melanoma ([place]) 1997  . Melanoma in situ of upper extremity ([place]) 03/19/2011  . Personal history of radiation therapy 2011   BREAST CA  . Seroma    HISTORY OF LFT BREAST  . Sleep apnea   . Thyroid cancer ([place]) 1992   Past Surgical History:  Procedure Laterality Date  . ABDOMINAL HYSTERECTOMY  1973   partial  . BREAST BIOPSY Left 02-13-13   BENIGN BREAST TISSUE WITH CHANGES CONSISTENT WITH FAT NECROSIS  . BREAST BIOPSY Left 01/21/2015   bx [facility] 11:00 left 6-8cmfn  . BREAST EXCISIONAL BIOPSY Left 1995   neg  . BREAST EXCISIONAL BIOPSY Left 2011   Breast cancer radiation  . BREAST LUMPECTOMY Left 2011   BREAST CA  . CARDIAC CATHETERIZATION    . CHOLECYSTECTOMY    . COLONOSCOPY  2013  . MELANOMA EXCISION     RT UPPER ARM  . PARTIAL HYSTERECTOMY     bleeding, ovaries in place.    . THYROID SURGERY  1992   FOR THYROID CANCER  .  TONSILLECTOMY     Family History  Problem Relation Age of Onset  . Heart disease Mother   . Cancer Brother        lung   . Cancer Sister        breast  . Breast cancer Neg Hx    Social History   Socioeconomic History  . Marital status: Widowed    Spouse name: None  . Number of children: None  . Years of education: None  . Highest education level: None  Social Needs  . Financial resource strain: None  . Food insecurity - worry: None  . Food insecurity - inability: None  . Transportation needs - medical: None  . Transportation needs - non-medical: None  Occupational History  . None  Tobacco Use  . Smoking status: Never Smoker  . Smokeless tobacco: Never Used  Substance and Sexual Activity  . Alcohol use: Yes    Alcohol/week: 0.0 oz    Comment: social drinking. average times a week  . Drug use: No  . Sexual activity: No  Other Topics Concern  . None  Social History Narrative   Independent and  baseline. Lives by herself    Outpatient Encounter Medications as of 11/09/2017  Medication Sig  . albuterol (PROAIR HFA) 108 (90 Base) MCG/ACT inhaler Inhale 2 puffs into the lungs every 6 (six) hours as needed for wheezing or shortness of breath.  . Cholecalciferol (VITAMIN D) 1000 UNITS capsule Take 1,000 Units by mouth daily.    Marland Kitchen diltiazem (CARDIZEM CD) 120 MG 24 hr capsule Take 1 capsule (120 mg total) by mouth daily.  . DULoxetine (CYMBALTA) 60 MG capsule TAKE 1 CAPSULE (60 MG TOTAL) BY MOUTH AT BEDTIME.  Marland Kitchen gabapentin (NEURONTIN) 300 MG capsule TAKE 2 CAPSULES (600 MG TOTAL) BY MOUTH AT BEDTIME.  Marland Kitchen levothyroxine (SYNTHROID, LEVOTHROID) 88 MCG tablet TAKE ONE TABLET BY MOUTH ONCE DAILY BEFORE BREAKFAST  . Multiple Vitamins-Minerals (PRESERVISION AREDS 2 PO) Take 2 tablets by mouth daily.    Marland Kitchen omeprazole (PRILOSEC) 20 MG capsule TAKE 1 CAPSULE (20 MG TOTAL) BY MOUTH DAILY.  Marland Kitchen propranolol (INDERAL) 10 MG tablet TAKE 1 TABLET (10 MG TOTAL) BY MOUTH DAILY AS NEEDED.  Marland Kitchen  triamcinolone (KENALOG) 0.025 % ointment Apply 1 application topically 2 (two) times daily.  Marland Kitchen warfarin (COUMADIN) 3 MG tablet TAKE 1 TABLET (3 MG TOTAL) BY MOUTH DAILY. (Patient taking differently: Take by mouth as directed. Take 4.5MG  by mouth every Wednesday and 3MG  by mouth daily all other days)   No facility-administered encounter medications on file as of 11/09/2017.     Review of Systems  Constitutional: Negative for appetite change and unexpected weight change.  HENT: Negative for congestion and sinus pressure.   Respiratory: Negative for cough and chest tightness.        Breathing better.    Cardiovascular: Negative for chest pain, palpitations and leg swelling.  Gastrointestinal: Negative for abdominal pain, diarrhea, nausea and vomiting.  Genitourinary: Negative for difficulty urinating and dysuria.  Musculoskeletal: Negative for myalgias.       Bilateral knee pain as outlined.    Skin: Negative for color change and rash.  Neurological: Negative for dizziness, light-headedness and headaches.  Psychiatric/Behavioral: Negative for agitation and dysphoric mood.       Objective:     Blood pressure rechecked by me:  128/68  Physical Exam  Constitutional: She appears well-developed and well-nourished. No distress.  HENT:  Nose: Nose normal.  Mouth/Throat: Oropharynx is clear and moist.  Neck: Neck supple. No thyromegaly present.  Cardiovascular: Normal rate.  Rate controlled.    Pulmonary/Chest: Breath sounds normal. No respiratory distress. She has no wheezes.  Abdominal: Soft. Bowel sounds are normal. There is no tenderness.  Musculoskeletal: She exhibits no edema or tenderness.  Lymphadenopathy:    She has no cervical adenopathy.  Skin: No rash noted. No erythema.  Psychiatric: She has a normal mood and affect. Her behavior is normal.    BP 134/76 (BP Location: Left Arm, Patient Position: Sitting, Cuff Size: Normal)   Pulse 83   Temp 98.2 F (36.8 C) (Oral)    Resp 18   Wt 183 lb 12.8 oz (83.4 kg)   SpO2 96%   BMI 30.59 kg/m  Wt Readings from Last 3 Encounters:  11/09/17 183 lb 12.8 oz (83.4 kg)  10/26/17 182 lb (82.6 kg)  10/11/17 186 lb (84.4 kg)     Lab Results  Component Value Date   WBC 5.3 10/11/2017   HGB 11.5 (L) 10/11/2017   HCT 35.7 (L) 10/11/2017   PLT 241.0 10/11/2017   GLUCOSE 100 (H) 10/11/2017   CHOL 186 09/29/2017  TRIG 268 (H) 09/29/2017   HDL 35 (L) 09/29/2017   LDLDIRECT 75.0 03/24/2017   LDLCALC 97 09/29/2017   ALT 11 10/11/2017   AST 17 10/11/2017   NA 139 10/11/2017   K 4.4 10/11/2017   CL 103 10/11/2017   CREATININE 0.75 10/11/2017   BUN 12 10/11/2017   CO2 32 10/11/2017   TSH 3.40 10/11/2017   INR 3.0 (H) 11/09/2017   HGBA1C 6.6 (H) 06/30/2017    Nm Myocar Multi W/spect W/wall Motion / Ef  Result Date: 09/30/2017  There was no ST segment deviation noted during stress.  No T wave inversion was noted during stress.  Defect 1: There is a small defect of moderate severity present in the apical septal and apex location.  This is a low risk study.  The left ventricular ejection fraction is normal (55-65%).  Cannot rule out very small area of apical ischemia. However, favor breast attenuation artifact, especially given normal systolic function in that region.        Assessment & Plan:   Problem List Items Addressed This Visit    Atrial fibrillation ([place]) - Primary    On coumadin.  Recently admitted with afib/RVR.  On cardizem.  Has been doing well since discharge.  Continue f/u with cardiology.        Relevant Orders   Protime-INR (Completed)   Bilateral knee pain    Saw rheumatology.  S/p injection.  Recommended f/u in 4 months.        Essential hypertension    Blood pressure under good control.  Continue same medication regimen.  Follow pressures.  Follow metabolic panel.        GERD    Controlled on prilosec.        Stress    Stable on cymbalta.  Follow.           Einar Pheasant, MD

## 2017-11-10 ENCOUNTER — Telehealth: Payer: Self-pay | Admitting: *Deleted

## 2017-11-10 DIAGNOSIS — Z7901 Long term (current) use of anticoagulants: Secondary | ICD-10-CM

## 2017-11-10 NOTE — Telephone Encounter (Signed)
-----   Message from Einar Pheasant, MD sent at 11/10/2017  3:51 AM EST ----- Notify pt that INR is slightly increased from previous checks.  Will hold on making any changes in the dose, because has been previously stable.  Continue same coumadin dose and recheck pt/inr in one week.

## 2017-11-12 ENCOUNTER — Encounter: Payer: Self-pay | Admitting: Internal Medicine

## 2017-11-12 DIAGNOSIS — M25561 Pain in right knee: Secondary | ICD-10-CM | POA: Insufficient documentation

## 2017-11-12 DIAGNOSIS — M25562 Pain in left knee: Secondary | ICD-10-CM

## 2017-11-12 NOTE — Assessment & Plan Note (Signed)
Blood pressure under good control.  Continue same medication regimen.  Follow pressures.  Follow metabolic panel.   

## 2017-11-12 NOTE — Assessment & Plan Note (Signed)
On coumadin.  Recently admitted with afib/RVR.  On cardizem.  Has been doing well since discharge.  Continue f/u with cardiology.

## 2017-11-12 NOTE — Assessment & Plan Note (Signed)
Stable on cymbalta.  Follow.

## 2017-11-12 NOTE — Assessment & Plan Note (Signed)
Saw rheumatology.  S/p injection.  Recommended f/u in 4 months.

## 2017-11-12 NOTE — Assessment & Plan Note (Signed)
Controlled on prilosec.   

## 2017-11-13 DIAGNOSIS — B079 Viral wart, unspecified: Secondary | ICD-10-CM | POA: Diagnosis not present

## 2017-11-16 ENCOUNTER — Other Ambulatory Visit: Payer: Medicare Other

## 2017-11-27 ENCOUNTER — Other Ambulatory Visit: Payer: Self-pay

## 2017-11-27 DIAGNOSIS — R928 Other abnormal and inconclusive findings on diagnostic imaging of breast: Secondary | ICD-10-CM

## 2017-11-27 DIAGNOSIS — Z853 Personal history of malignant neoplasm of breast: Secondary | ICD-10-CM

## 2017-12-03 ENCOUNTER — Other Ambulatory Visit: Payer: Self-pay | Admitting: Internal Medicine

## 2017-12-18 ENCOUNTER — Telehealth: Payer: Self-pay

## 2017-12-18 NOTE — Telephone Encounter (Signed)
Copied from [place] [id number]. Topic: Appointment Scheduling - Scheduling Inquiry for Clinic >> Dec 18, 2017  2:29 PM Margot Ables wrote: Reason for CRM: pt called in stating she needs an INR check that she missed. Please advise.

## 2017-12-20 NOTE — Telephone Encounter (Signed)
Left message to call back and schedule INR. She needs lab appt asap.

## 2017-12-21 ENCOUNTER — Other Ambulatory Visit (INDEPENDENT_AMBULATORY_CARE_PROVIDER_SITE_OTHER): Payer: Medicare Other

## 2017-12-21 DIAGNOSIS — Z7901 Long term (current) use of anticoagulants: Secondary | ICD-10-CM | POA: Diagnosis not present

## 2017-12-21 LAB — PROTIME-INR
INR: 3.3 ratio — AB (ref 0.8–1.0)
Prothrombin Time: 35.2 s — ABNORMAL HIGH (ref 9.6–13.1)

## 2017-12-21 NOTE — Telephone Encounter (Signed)
Pt scheduled for labs today

## 2017-12-25 ENCOUNTER — Other Ambulatory Visit: Payer: Self-pay | Admitting: Internal Medicine

## 2017-12-25 ENCOUNTER — Telehealth: Payer: Self-pay | Admitting: Internal Medicine

## 2017-12-25 NOTE — Telephone Encounter (Signed)
Copied from [place] [id number]. Topic: Quick Communication - See Telephone Encounter >> Dec 25, 2017  4:27 PM Bea Graff, NT wrote: CRM for notification. See Telephone encounter for: 12/25/17. Pt calling to get her lab results.

## 2017-12-26 NOTE — Telephone Encounter (Signed)
Result note sent to Dr. Nicki Reaper.

## 2017-12-27 ENCOUNTER — Telehealth: Payer: Self-pay | Admitting: Radiology

## 2017-12-27 NOTE — Telephone Encounter (Signed)
Pt coming in for labs tomorrow, appt says for non fasting labs. Please place future orders. Thank you

## 2017-12-28 ENCOUNTER — Other Ambulatory Visit (INDEPENDENT_AMBULATORY_CARE_PROVIDER_SITE_OTHER): Payer: Medicare Other

## 2017-12-28 ENCOUNTER — Other Ambulatory Visit: Payer: Self-pay | Admitting: Internal Medicine

## 2017-12-28 DIAGNOSIS — R739 Hyperglycemia, unspecified: Secondary | ICD-10-CM | POA: Diagnosis not present

## 2017-12-28 DIAGNOSIS — I4819 Other persistent atrial fibrillation: Secondary | ICD-10-CM

## 2017-12-28 DIAGNOSIS — I10 Essential (primary) hypertension: Secondary | ICD-10-CM

## 2017-12-28 DIAGNOSIS — I481 Persistent atrial fibrillation: Secondary | ICD-10-CM

## 2017-12-28 LAB — BASIC METABOLIC PANEL
BUN: 11 mg/dL (ref 6–23)
CO2: 30 mEq/L (ref 19–32)
Calcium: 9.7 mg/dL (ref 8.4–10.5)
Chloride: 105 mEq/L (ref 96–112)
Creatinine, Ser: 0.75 mg/dL (ref 0.40–1.20)
GFR: 78.15 mL/min (ref >60.00)
Glucose, Bld: 121 mg/dL — ABNORMAL HIGH (ref 70–99)
POTASSIUM: 3.9 meq/L (ref 3.5–5.1)
SODIUM: 140 meq/L (ref 135–145)

## 2017-12-28 LAB — PROTIME-INR
INR: 2.7 ratio — ABNORMAL HIGH (ref 0.8–1.0)
Prothrombin Time: 29.2 s — ABNORMAL HIGH (ref 9.6–13.1)

## 2017-12-28 LAB — HEMOGLOBIN A1C: HEMOGLOBIN A1C: 5.9 % (ref 4.6–6.5)

## 2017-12-28 NOTE — Telephone Encounter (Signed)
Orders placed for f/u labs.  

## 2017-12-28 NOTE — Progress Notes (Signed)
Orders placed for f/u labs.  

## 2018-01-05 DIAGNOSIS — H353221 Exudative age-related macular degeneration, left eye, with active choroidal neovascularization: Secondary | ICD-10-CM | POA: Diagnosis not present

## 2018-01-09 ENCOUNTER — Other Ambulatory Visit: Payer: Self-pay | Admitting: Radiology

## 2018-01-09 DIAGNOSIS — Z7901 Long term (current) use of anticoagulants: Principal | ICD-10-CM

## 2018-01-09 DIAGNOSIS — Z5181 Encounter for therapeutic drug level monitoring: Secondary | ICD-10-CM

## 2018-01-10 ENCOUNTER — Other Ambulatory Visit (INDEPENDENT_AMBULATORY_CARE_PROVIDER_SITE_OTHER): Payer: Medicare Other

## 2018-01-10 ENCOUNTER — Other Ambulatory Visit: Payer: Medicare Other

## 2018-01-10 DIAGNOSIS — Z5181 Encounter for therapeutic drug level monitoring: Secondary | ICD-10-CM

## 2018-01-10 DIAGNOSIS — Z7901 Long term (current) use of anticoagulants: Secondary | ICD-10-CM | POA: Diagnosis not present

## 2018-01-10 LAB — PROTIME-INR
INR: 3 ratio — ABNORMAL HIGH (ref 0.8–1.0)
Prothrombin Time: 34.1 s — ABNORMAL HIGH (ref 9.6–13.1)

## 2018-01-15 ENCOUNTER — Telehealth: Payer: Self-pay | Admitting: Internal Medicine

## 2018-01-15 ENCOUNTER — Ambulatory Visit: Payer: Self-pay | Admitting: *Deleted

## 2018-01-15 NOTE — Telephone Encounter (Signed)
error 

## 2018-01-17 ENCOUNTER — Ambulatory Visit
Admission: RE | Admit: 2018-01-17 | Discharge: 2018-01-17 | Disposition: A | Discharge status: Home / Self Care | Payer: Medicare Other | Source: Ambulatory Visit | Attending: General Surgery | Admitting: General Surgery

## 2018-01-17 DIAGNOSIS — R928 Other abnormal and inconclusive findings on diagnostic imaging of breast: Secondary | ICD-10-CM

## 2018-01-17 DIAGNOSIS — Z853 Personal history of malignant neoplasm of breast: Secondary | ICD-10-CM

## 2018-01-17 DIAGNOSIS — Z08 Encounter for follow-up examination after completed treatment for malignant neoplasm: Secondary | ICD-10-CM | POA: Insufficient documentation

## 2018-01-17 DIAGNOSIS — R918 Other nonspecific abnormal finding of lung field: Secondary | ICD-10-CM | POA: Diagnosis not present

## 2018-01-18 ENCOUNTER — Other Ambulatory Visit: Payer: Self-pay | Admitting: Radiology

## 2018-01-18 DIAGNOSIS — Z7901 Long term (current) use of anticoagulants: Principal | ICD-10-CM

## 2018-01-18 DIAGNOSIS — Z5181 Encounter for therapeutic drug level monitoring: Secondary | ICD-10-CM

## 2018-01-19 ENCOUNTER — Other Ambulatory Visit (INDEPENDENT_AMBULATORY_CARE_PROVIDER_SITE_OTHER): Payer: Medicare Other

## 2018-01-19 DIAGNOSIS — Z7901 Long term (current) use of anticoagulants: Secondary | ICD-10-CM

## 2018-01-19 DIAGNOSIS — Z5181 Encounter for therapeutic drug level monitoring: Secondary | ICD-10-CM | POA: Diagnosis not present

## 2018-01-19 LAB — PROTIME-INR
INR: 3.2 ratio — ABNORMAL HIGH (ref 0.8–1.0)
PROTHROMBIN TIME: 36.1 s — AB (ref 9.6–13.1)

## 2018-01-22 ENCOUNTER — Other Ambulatory Visit: Payer: Self-pay | Admitting: Internal Medicine

## 2018-01-23 ENCOUNTER — Ambulatory Visit (INDEPENDENT_AMBULATORY_CARE_PROVIDER_SITE_OTHER): Payer: Medicare Other | Admitting: Podiatry

## 2018-01-23 ENCOUNTER — Ambulatory Visit (INDEPENDENT_AMBULATORY_CARE_PROVIDER_SITE_OTHER): Payer: Medicare Other | Admitting: General Surgery

## 2018-01-23 ENCOUNTER — Encounter: Payer: Self-pay | Admitting: General Surgery

## 2018-01-23 ENCOUNTER — Encounter: Payer: Self-pay | Admitting: Podiatry

## 2018-01-23 VITALS — BP 122/74 | HR 68 | Resp 14 | Ht 65.0 in | Wt 183.0 lb

## 2018-01-23 DIAGNOSIS — L603 Nail dystrophy: Secondary | ICD-10-CM | POA: Diagnosis not present

## 2018-01-23 DIAGNOSIS — D0502 Lobular carcinoma in situ of left breast: Secondary | ICD-10-CM

## 2018-01-23 DIAGNOSIS — L84 Corns and callosities: Secondary | ICD-10-CM

## 2018-01-23 NOTE — Patient Instructions (Signed)
Patient will be asked to return to the office in one year with a bilateral screening mammogram. The patient is aware to call back for any questions or concerns. 

## 2018-01-23 NOTE — Progress Notes (Signed)
Patient ID: Kerri Mills, female   DOB: 1931-02-09, 84 y.o.   MRN: [id number]  Chief Complaint  Patient presents with  . Follow-up    HPI Kerri Mills is a 84 y.o. female who presents for a breast evaluation. The most recent mammogram was done on 01/17/2018.Marland Kitchen  Patient does perform regular self breast checks and gets regular mammograms done.     Past Medical History:  Diagnosis Date  . Allergy   . Anxiety   . Arthritis   . Breast CA ([place])   . Breast cancer ([place]) 2011   LT LUMPECTOMY  . Clotting disorder ([place])   . Colitis   . Depression   . Diverticulitis 2013  . Gastric ulcer   . GERD (gastroesophageal reflux disease)   . Heart disease   . Hypercholesterolemia   . Hypertension   . Infiltrating lobular carcinoma of left breast 2011   T2,N0, ER: 90%; PR 0%; Her 2 neu not amplified. [facility]).  . Melanoma ([place]) 1997  . Melanoma in situ of upper extremity ([place]) 03/19/2011  . Personal history of radiation therapy 2011   BREAST CA  . Seroma    HISTORY OF LFT BREAST  . Sleep apnea   . Thyroid cancer ([place]) 1992    Past Surgical History:  Procedure Laterality Date  . ABDOMINAL HYSTERECTOMY  1973   partial  . BREAST BIOPSY Left 02-13-13   BENIGN BREAST TISSUE WITH CHANGES CONSISTENT WITH FAT NECROSIS  . BREAST BIOPSY Left 01/21/2015   bx done in [facility] 11:00 left 6-8cmfn  . BREAST EXCISIONAL BIOPSY Left 1995   neg  . BREAST EXCISIONAL BIOPSY Left 2011   Breast cancer radiation  . BREAST LUMPECTOMY Left 2011   BREAST CA  . CARDIAC CATHETERIZATION    . CHOLECYSTECTOMY    . COLONOSCOPY  2013  . MELANOMA EXCISION     RT UPPER ARM  . PARTIAL HYSTERECTOMY     bleeding, ovaries in place.    . THYROID SURGERY  1992   FOR THYROID CANCER  . TONSILLECTOMY      Family History  Problem Relation Age of Onset  . Heart disease Mother   . Cancer Brother        lung   . Cancer Sister        breast  . Breast cancer Neg Hx     Social History Social History    Tobacco Use  . Smoking status: Never Smoker  . Smokeless tobacco: Never Used  Substance Use Topics  . Alcohol use: Yes    Alcohol/week: 0.0 oz    Comment: social drinking. average times a week  . Drug use: No    Allergies  Allergen Reactions  . Atorvastatin     Other reaction(s): Other (See Comments) STIFFNESS AND SORE STIFFNESS AND SORE  . Lipitor [Atorvastatin Calcium] Other (See Comments)    Stiffness & soreness  . Penicillins Rash    REACTION: Unknown reaction    Current Outpatient Medications  Medication Sig Dispense Refill  . albuterol (PROAIR HFA) 108 (90 Base) MCG/ACT inhaler Inhale 2 puffs into the lungs every 6 (six) hours as needed for wheezing or shortness of breath. 1 Inhaler 3  . Cholecalciferol (VITAMIN D) 1000 UNITS capsule Take 1,000 Units by mouth daily.      Marland Kitchen diltiazem (CARDIZEM CD) 120 MG 24 hr capsule Take 1 capsule (120 mg total) by mouth daily. 90 capsule 3  . DULoxetine (CYMBALTA) 60 MG capsule  TAKE 1 CAPSULE (60 MG TOTAL) BY MOUTH AT BEDTIME. 90 capsule 1  . gabapentin (NEURONTIN) 300 MG capsule TAKE 2 CAPSULES (600 MG TOTAL) BY MOUTH AT BEDTIME. 180 capsule 1  . levothyroxine (SYNTHROID, LEVOTHROID) 88 MCG tablet TAKE ONE TABLET BY MOUTH ONCE DAILY BEFORE BREAKFAST 90 tablet 2  . Multiple Vitamin (MULTI-VITAMINS) TABS Take by mouth.    . Multiple Vitamins-Minerals (PRESERVISION AREDS 2 PO) Take 2 tablets by mouth daily.      Marland Kitchen omeprazole (PRILOSEC) 20 MG capsule TAKE 1 CAPSULE (20 MG TOTAL) BY MOUTH DAILY. 90 capsule 3  . propranolol (INDERAL) 10 MG tablet TAKE 1 TABLET (10 MG TOTAL) BY MOUTH DAILY AS NEEDED. 90 tablet 1  . triamcinolone (KENALOG) 0.025 % ointment Apply 1 application topically 2 (two) times daily. 30 g 0  . warfarin (COUMADIN) 3 MG tablet TAKE 1 TABLET (3 MG TOTAL) BY MOUTH DAILY. 90 tablet 1   No current facility-administered medications for this visit.     Review of Systems Review of Systems  Constitutional: Negative.    Respiratory: Negative.   Cardiovascular: Negative.     Blood pressure 122/74, pulse 68, resp. rate 14, height 5\' 5"  (1.651 m), weight 183 lb (83 kg).  Physical Exam Physical Exam  Constitutional: She is oriented to person, place, and time. She appears well-developed and well-nourished.  Eyes: Conjunctivae are normal. No scleral icterus.  Neck: Neck supple.  Cardiovascular: Normal rate, regular rhythm and normal heart sounds.  Pulmonary/Chest: Effort normal and breath sounds normal. Right breast exhibits no inverted nipple, no mass, no nipple discharge, no skin change and no tenderness. Left breast exhibits no inverted nipple, no mass, no nipple discharge, no skin change and no tenderness.    Lymphadenopathy:    She has no cervical adenopathy.    She has no axillary adenopathy.  Neurological: She is alert and oriented to person, place, and time.  Skin: Skin is warm and dry.    Data Reviewed Bilateral diagnostic mammograms dated January 17, 2018 were reviewed.  Postsurgical changes.  BI-RADS-2.  Screening exam recommended for next study.  Bone density dated April 03, 2017 showed a T score of -3.2 consistent with osteoporosis.  Assessment    No evidence of recurrent cancer.    Plan  Patient will be asked to return to the office in one year with a bilateral screening mammogram.The patient is aware to call back for any questions or concerns.  HPI, Physical Exam, Assessment and Plan have been scribed under the direction and in the presence of Hervey Ard, MD.  Gaspar Cola, CMA  I have completed the exam and reviewed the above documentation for accuracy and completeness.  I agree with the above.  Haematologist has been used and any errors in dictation or transcription are unintentional.  Hervey Ard, M.D., F.A.C.S.  Kerri Mills 01/24/2018, 5:18 PM

## 2018-01-25 NOTE — Progress Notes (Signed)
   Subjective: 84 year old female presenting today with a chief complaint of painful callus lesions noted to the bilateral feet that have been ongoing for several months. She also complains of a painful right great toenail that began several months ago as well. She reports associated curving of the nail. She reports hammertoes to the left 2nd toe. Bearing weight and walking increases the pain of the callus lesions and applying pressure to the right great toenail increases pain to it. She has not done anything for treatment at home. Patient is here for further evaluation and treatment.   Past Medical History:  Diagnosis Date  . Allergy   . Anxiety   . Arthritis   . Breast CA ([place])   . Breast cancer ([place]) 2011   LT LUMPECTOMY  . Clotting disorder ([place])   . Colitis   . Depression   . Diverticulitis 2013  . Gastric ulcer   . GERD (gastroesophageal reflux disease)   . Heart disease   . Hypercholesterolemia   . Hypertension   . Infiltrating lobular carcinoma of left breast 2011   T2,N0, ER: 90%; PR 0%; Her 2 neu not amplified. [facility]).  . Melanoma ([place]) 1997  . Melanoma in situ of upper extremity ([place]) 03/19/2011  . Personal history of radiation therapy 2011   BREAST CA  . Seroma    HISTORY OF LFT BREAST  . Sleep apnea   . Thyroid cancer ([place]) 1992    Objective: Physical Exam General: The patient is alert and oriented x3 in no acute distress.  Dermatology: Hyperkeratotic, discolored, thickened, onychodystrophy of right great toenail. Hyperkeratotic lesions present on the bilateral feet x 4. Pain on palpation with a central nucleated core noted. Skin is warm, dry and supple bilateral lower extremities. Negative for open lesions or macerations.  Vascular: Palpable pedal pulses bilaterally. No edema or erythema noted. Capillary refill within normal limits.  Neurological: Epicritic and protective threshold grossly intact bilaterally.   Musculoskeletal Exam: Range of  motion within normal limits to all pedal and ankle joints bilateral. Muscle strength 5/5 in all groups bilateral.   Assessment: #1 dystrophic nail right great toe #2 pre-ulcerative callus lesions x 4 noted to bilateral feet   Plan of Care:  #1 Patient was evaluated. #2 Excisional debridement of keratoic lesions using a chisel blade was performed without incident.  #3 Light dressing applied.  #4 Mechanical debridement of right great toenail performed using a nail nipper. Filed with dremel without incident.  #5 Return to clinic in 3 months.    Edrick Kins, DPM [facility]  Dr. Edrick Kins, [place]                                        [place], [place] [address]                Office [phone]  Fax [phone]

## 2018-02-02 ENCOUNTER — Other Ambulatory Visit: Payer: Self-pay | Admitting: Radiology

## 2018-02-02 DIAGNOSIS — Z7901 Long term (current) use of anticoagulants: Principal | ICD-10-CM

## 2018-02-02 DIAGNOSIS — Z5181 Encounter for therapeutic drug level monitoring: Secondary | ICD-10-CM

## 2018-02-05 ENCOUNTER — Other Ambulatory Visit (INDEPENDENT_AMBULATORY_CARE_PROVIDER_SITE_OTHER): Payer: Medicare Other

## 2018-02-05 DIAGNOSIS — Z7901 Long term (current) use of anticoagulants: Secondary | ICD-10-CM | POA: Diagnosis not present

## 2018-02-05 DIAGNOSIS — Z5181 Encounter for therapeutic drug level monitoring: Secondary | ICD-10-CM | POA: Diagnosis not present

## 2018-02-05 LAB — PROTIME-INR
INR: 2.2 ratio — AB (ref 0.8–1.0)
PROTHROMBIN TIME: 25.4 s — AB (ref 9.6–13.1)

## 2018-02-15 ENCOUNTER — Ambulatory Visit (INDEPENDENT_AMBULATORY_CARE_PROVIDER_SITE_OTHER): Payer: Medicare Other | Admitting: Internal Medicine

## 2018-02-15 ENCOUNTER — Encounter: Payer: Self-pay | Admitting: Internal Medicine

## 2018-02-15 VITALS — BP 132/76 | HR 60 | Temp 97.8°F | Resp 18 | Wt 179.6 lb

## 2018-02-15 DIAGNOSIS — M25562 Pain in left knee: Secondary | ICD-10-CM

## 2018-02-15 DIAGNOSIS — C73 Malignant neoplasm of thyroid gland: Secondary | ICD-10-CM

## 2018-02-15 DIAGNOSIS — K219 Gastro-esophageal reflux disease without esophagitis: Secondary | ICD-10-CM | POA: Diagnosis not present

## 2018-02-15 DIAGNOSIS — I10 Essential (primary) hypertension: Secondary | ICD-10-CM

## 2018-02-15 DIAGNOSIS — E7800 Pure hypercholesterolemia, unspecified: Secondary | ICD-10-CM

## 2018-02-15 DIAGNOSIS — M25561 Pain in right knee: Secondary | ICD-10-CM

## 2018-02-15 DIAGNOSIS — D0360 Melanoma in situ of unspecified upper limb, including shoulder: Secondary | ICD-10-CM | POA: Diagnosis not present

## 2018-02-15 DIAGNOSIS — D649 Anemia, unspecified: Secondary | ICD-10-CM | POA: Diagnosis not present

## 2018-02-15 DIAGNOSIS — Z853 Personal history of malignant neoplasm of breast: Secondary | ICD-10-CM | POA: Diagnosis not present

## 2018-02-15 DIAGNOSIS — R739 Hyperglycemia, unspecified: Secondary | ICD-10-CM | POA: Diagnosis not present

## 2018-02-15 DIAGNOSIS — I481 Persistent atrial fibrillation: Secondary | ICD-10-CM

## 2018-02-15 DIAGNOSIS — I4819 Other persistent atrial fibrillation: Secondary | ICD-10-CM

## 2018-02-15 LAB — CBC WITH DIFFERENTIAL/PLATELET
Basophils Absolute: 0.1 10*3/uL (ref 0.0–0.1)
Basophils Relative: 1.7 % (ref 0.0–3.0)
EOS PCT: 2.6 % (ref 0.0–5.0)
Eosinophils Absolute: 0.1 10*3/uL (ref 0.0–0.7)
HEMATOCRIT: 39.4 % (ref 36.0–46.0)
HEMOGLOBIN: 13 g/dL (ref 12.0–15.0)
Lymphocytes Relative: 23.9 % (ref 12.0–46.0)
Lymphs Abs: 1.1 10*3/uL (ref 0.7–4.0)
MCHC: 32.9 g/dL (ref 30.0–36.0)
MCV: 92.3 fl (ref 78.0–100.0)
MONO ABS: 0.5 10*3/uL (ref 0.1–1.0)
Monocytes Relative: 10.1 % (ref 3.0–12.0)
Neutro Abs: 2.9 10*3/uL (ref 1.4–7.7)
Neutrophils Relative %: 61.7 % (ref 43.0–77.0)
Platelets: 216 10*3/uL (ref 150.0–400.0)
RBC: 4.26 Mil/uL (ref 3.87–5.11)
RDW: 13.3 % (ref 11.5–15.5)
WBC: 4.7 10*3/uL (ref 4.0–10.5)

## 2018-02-15 LAB — LIPID PANEL
CHOL/HDL RATIO: 5
Cholesterol: 169 mg/dL (ref 0–200)
HDL: 36.6 mg/dL — AB (ref >39.00)
LDL CALC: 93 mg/dL (ref 0–99)
NONHDL: 132.38
Triglycerides: 198 mg/dL — ABNORMAL HIGH (ref 0.0–149.0)
VLDL: 39.6 mg/dL (ref 0.0–40.0)

## 2018-02-15 LAB — VITAMIN B12: Vitamin B-12: 757 pg/mL (ref 211–911)

## 2018-02-15 LAB — PROTIME-INR
INR: 2.5 ratio — ABNORMAL HIGH (ref 0.8–1.0)
Prothrombin Time: 29.2 s — ABNORMAL HIGH (ref 9.6–13.1)

## 2018-02-15 LAB — FERRITIN: FERRITIN: 18 ng/mL (ref 10.0–291.0)

## 2018-02-15 MED ORDER — NYSTATIN 100000 UNIT/GM EX CREA: 1.0000 "application " | TOPICAL_CREAM | Freq: Two times a day (BID) | CUTANEOUS | 0 refills | Status: DC

## 2018-02-15 NOTE — Progress Notes (Signed)
Patient ID: KANOE WANNER, female   DOB: Apr 05, 1931, 84 y.o.   MRN: [id number]   Subjective:    Patient ID: [name], female    DOB: 09-25-1931, 84 y.o.   MRN: [id number]  HPI  Patient here for a scheduled follow up.  She reports she is doing relatively well.  Tries to stay active.  Is walking.  Hs noticed some discomfort in both knees.  Does not limit her usual activity.  Desires no further intervention.  Has noticed some stinging in the veins in her legs.  Got compression hose.  Helped.  Desires no further intervention.  No chest pain.  No sob.  No acid reflux.  No abdominal pain.  Bowels moving.  No urine change.     Past Medical History:  Diagnosis Date  . Allergy   . Anxiety   . Arthritis   . Breast CA ([place])   . Breast cancer ([place]) 2011   LT LUMPECTOMY  . Clotting disorder ([place])   . Colitis   . Depression   . Diverticulitis 2013  . Gastric ulcer   . GERD (gastroesophageal reflux disease)   . Heart disease   . Hypercholesterolemia   . Hypertension   . Infiltrating lobular carcinoma of left breast 2011   T2,N0, ER: 90%; PR 0%; Her 2 neu not amplified. [facility]).  . Melanoma ([place]) 1997  . Melanoma in situ of upper extremity ([place]) 03/19/2011  . Personal history of radiation therapy 2011   BREAST CA  . Seroma    HISTORY OF LFT BREAST  . Sleep apnea   . Thyroid cancer ([place]) 1992   Past Surgical History:  Procedure Laterality Date  . ABDOMINAL HYSTERECTOMY  1973   partial  . BREAST BIOPSY Left 02-13-13   BENIGN BREAST TISSUE WITH CHANGES CONSISTENT WITH FAT NECROSIS  . BREAST BIOPSY Left 01/21/2015   bx done in [facility] 11:00 left 6-8cmfn  . BREAST EXCISIONAL BIOPSY Left 1995   neg  . BREAST EXCISIONAL BIOPSY Left 2011   Breast cancer radiation  . BREAST LUMPECTOMY Left 2011   BREAST CA  . CARDIAC CATHETERIZATION    . CHOLECYSTECTOMY    . COLONOSCOPY  2013  . MELANOMA EXCISION     RT UPPER ARM  . PARTIAL HYSTERECTOMY     bleeding, ovaries in  place.    . THYROID SURGERY  1992   FOR THYROID CANCER  . TONSILLECTOMY     Family History  Problem Relation Age of Onset  . Heart disease Mother   . Cancer Brother        lung   . Cancer Sister        breast  . Breast cancer Neg Hx    Social History   Socioeconomic History  . Marital status: Widowed    Spouse name: Not on file  . Number of children: Not on file  . Years of education: Not on file  . Highest education level: Not on file  Occupational History  . Not on file  Social Needs  . Financial resource strain: Not on file  . Food insecurity:    Worry: Not on file    Inability: Not on file  . Transportation needs:    Medical: Not on file    Non-medical: Not on file  Tobacco Use  . Smoking status: Never Smoker  . Smokeless tobacco: Never Used  Substance and Sexual Activity  . Alcohol use: Yes    Alcohol/week:  0.0 oz    Comment: social drinking. average times a week  . Drug use: No  . Sexual activity: Never  Lifestyle  . Physical activity:    Days per week: Not on file    Minutes per session: Not on file  . Stress: Not on file  Relationships  . Social connections:    Talks on phone: Not on file    Gets together: Not on file    Attends religious service: Not on file    Active member of club or organization: Not on file    Attends meetings of clubs or organizations: Not on file    Relationship status: Not on file  Other Topics Concern  . Not on file  Social History Narrative   Independent and baseline. Lives by herself    Outpatient Encounter Medications as of 02/15/2018  Medication Sig  . albuterol (PROAIR HFA) 108 (90 Base) MCG/ACT inhaler Inhale 2 puffs into the lungs every 6 (six) hours as needed for wheezing or shortness of breath.  . Cholecalciferol (VITAMIN D) 1000 UNITS capsule Take 1,000 Units by mouth daily.    Marland Kitchen diltiazem (CARDIZEM CD) 120 MG 24 hr capsule Take 1 capsule (120 mg total) by mouth daily.  . DULoxetine (CYMBALTA) 60 MG capsule  TAKE 1 CAPSULE (60 MG TOTAL) BY MOUTH AT BEDTIME.  Marland Kitchen gabapentin (NEURONTIN) 300 MG capsule TAKE 2 CAPSULES (600 MG TOTAL) BY MOUTH AT BEDTIME.  Marland Kitchen levothyroxine (SYNTHROID, LEVOTHROID) 88 MCG tablet TAKE ONE TABLET BY MOUTH ONCE DAILY BEFORE BREAKFAST  . Multiple Vitamins-Minerals (PRESERVISION AREDS 2 PO) Take 2 tablets by mouth daily.    Marland Kitchen omeprazole (PRILOSEC) 20 MG capsule TAKE 1 CAPSULE (20 MG TOTAL) BY MOUTH DAILY.  Marland Kitchen propranolol (INDERAL) 10 MG tablet TAKE 1 TABLET (10 MG TOTAL) BY MOUTH DAILY AS NEEDED.  Marland Kitchen warfarin (COUMADIN) 3 MG tablet TAKE 1 TABLET (3 MG TOTAL) BY MOUTH DAILY.  . [DISCONTINUED] Multiple Vitamin (MULTI-VITAMINS) TABS Take by mouth.  . [DISCONTINUED] triamcinolone (KENALOG) 0.025 % ointment Apply 1 application topically 2 (two) times daily.  Marland Kitchen nystatin cream (MYCOSTATIN) Apply 1 application topically 2 (two) times daily.   No facility-administered encounter medications on file as of 02/15/2018.     Review of Systems  Constitutional: Negative for appetite change and unexpected weight change.  HENT: Negative for congestion and sinus pressure.   Respiratory: Negative for cough, chest tightness and shortness of breath.   Cardiovascular: Negative for chest pain, palpitations and leg swelling.  Gastrointestinal: Negative for abdominal pain, diarrhea, nausea and vomiting.  Genitourinary: Negative for difficulty urinating and dysuria.  Musculoskeletal: Negative for joint swelling and myalgias.       Knee discomfort as outlined.    Skin: Negative for color change and rash.  Neurological: Negative for dizziness, light-headedness and headaches.  Psychiatric/Behavioral: Negative for agitation and dysphoric mood.       Objective:    Physical Exam  Constitutional: She appears well-developed and well-nourished. No distress.  HENT:  Nose: Nose normal.  Mouth/Throat: Oropharynx is clear and moist.  Neck: Neck supple. No thyromegaly present.  Cardiovascular: Normal rate and  regular rhythm.  Pulmonary/Chest: Breath sounds normal. No respiratory distress. She has no wheezes.  Abdominal: Soft. Bowel sounds are normal. There is no tenderness.  Musculoskeletal: She exhibits no edema or tenderness.  Lymphadenopathy:    She has no cervical adenopathy.  Skin: No rash noted. No erythema.  Psychiatric: She has a normal mood and affect. Her behavior is normal.  BP 132/76 (BP Location: Right Arm, Patient Position: Sitting, Cuff Size: Normal)   Pulse 60   Temp 97.8 F (36.6 C) (Oral)   Resp 18   Wt 179 lb 9.6 oz (81.5 kg)   SpO2 97%   BMI 29.89 kg/m  Wt Readings from Last 3 Encounters:  02/15/18 179 lb 9.6 oz (81.5 kg)  01/23/18 183 lb (83 kg)  11/09/17 183 lb 12.8 oz (83.4 kg)     Lab Results  Component Value Date   WBC 4.7 02/15/2018   HGB 13.0 02/15/2018   HCT 39.4 02/15/2018   PLT 216.0 02/15/2018   GLUCOSE 121 (H) 12/28/2017   CHOL 169 02/15/2018   TRIG 198.0 (H) 02/15/2018   HDL 36.60 (L) 02/15/2018   LDLDIRECT 75.0 03/24/2017   LDLCALC 93 02/15/2018   ALT 11 10/11/2017   AST 17 10/11/2017   NA 140 12/28/2017   K 3.9 12/28/2017   CL 105 12/28/2017   CREATININE 0.75 12/28/2017   BUN 11 12/28/2017   CO2 30 12/28/2017   TSH 3.40 10/11/2017   INR 2.5 (H) 02/15/2018   HGBA1C 5.9 12/28/2017    Mm Diag Breast Tomo Bilateral  Result Date: 01/17/2018 CLINICAL DATA:  Follow-up for probably benign fat necrosis in the LEFT breast. History of LEFT breast cancer in 2011 status post breast conservation surgery. History of 2 subsequent LEFT breast biopsies which were benign. EXAM: DIGITAL DIAGNOSTIC BILATERAL MAMMOGRAM WITH CAD AND TOMO COMPARISON:  Previous exams including most recent diagnostic mammogram dated 01/13/2017. ACR Breast Density Category b: There are scattered areas of fibroglandular density. FINDINGS: There are expected postsurgical changes within the LEFT breast. Previously questioned asymmetries within the LEFT breast are not  significantly changed in appearance and again compatible with fat necrosis. There are no new dominant masses, suspicious calcifications or secondary signs of malignancy identified within either breast. Mammographic images were processed with CAD. IMPRESSION: No evidence of malignancy within either breast. Expected postsurgical changes within the LEFT breast. Patient may return to routine annual bilateral screening mammogram schedule. RECOMMENDATION: Screening mammogram in one year.(Code:SM-B-01Y) I have discussed the findings and recommendations with the patient. Results were also provided in writing at the conclusion of the visit. If applicable, a reminder letter will be sent to the patient regarding the next appointment. BI-RADS CATEGORY  2: Benign. Electronically Signed   By: Franki Cabot M.D.   On: 01/17/2018 12:13       Assessment & Plan:   Problem List Items Addressed This Visit    Anemia - Primary    Previous decreased hgb and iron stores.  Recheck cbc and ferritin today.        Relevant Orders   CBC with Differential/Platelet (Completed)   Ferritin (Completed)   Vitamin B12 (Completed)   Atrial fibrillation ([place])    On cardizem.  No increased heart rate or palpitations.  On coumadin.  Doing well. Check pt/inr today. Continues f/u with cardiology.        Relevant Orders   Protime-INR (Completed)   Bilateral knee pain    Saw rheumatology.  D/p injection.  Desires no further intervention.  Follow.       Essential hypertension    Blood pressure under good control.  Continue same medication regimen.  Follow pressures.  Follow metabolic panel.        GERD    Controlled on prilosec.        History of breast cancer    Saw Dr Bary Castilla 01/23/18.  Mammogram  01/17/18 - birads II.  Recommended f/u one year Dr Bary Castilla.        Hypercholesterolemia    On lovastatin.  Low cholesterol diet and exercise.  Follow lipid panel and liver function tests.        Relevant Orders   Lipid panel  (Completed)   Hyperglycemia    Low carb diet and exercise.  Follow met b and a1c.        Melanoma in situ of upper extremity ([place])    Followed by dermatology.        Thyroid cancer ([place])    S/p XRT.  On thyroid replacement.  Follow tsh.           Einar Pheasant, MD

## 2018-02-18 ENCOUNTER — Encounter: Payer: Self-pay | Admitting: Internal Medicine

## 2018-02-18 NOTE — Assessment & Plan Note (Signed)
Saw Dr Bary Castilla 01/23/18.  Mammogram 01/17/18 - birads II.  Recommended f/u one year Dr Bary Castilla.

## 2018-02-18 NOTE — Assessment & Plan Note (Signed)
Low carb diet and exercise.  Follow met b and a1c.   

## 2018-02-18 NOTE — Assessment & Plan Note (Signed)
On lovastatin.  Low cholesterol diet and exercise.  Follow lipid panel and liver function tests.   

## 2018-02-18 NOTE — Assessment & Plan Note (Signed)
Followed by dermatology

## 2018-02-18 NOTE — Assessment & Plan Note (Signed)
On cardizem.  No increased heart rate or palpitations.  On coumadin.  Doing well. Check pt/inr today. Continues f/u with cardiology.

## 2018-02-18 NOTE — Assessment & Plan Note (Signed)
Blood pressure under good control.  Continue same medication regimen.  Follow pressures.  Follow metabolic panel.   

## 2018-02-18 NOTE — Assessment & Plan Note (Signed)
S/p XRT.  On thyroid replacement.  Follow tsh.

## 2018-02-18 NOTE — Assessment & Plan Note (Signed)
Saw rheumatology.  D/p injection.  Desires no further intervention.  Follow.

## 2018-02-18 NOTE — Assessment & Plan Note (Signed)
Controlled on prilosec.   

## 2018-02-18 NOTE — Assessment & Plan Note (Signed)
Previous decreased hgb and iron stores.  Recheck cbc and ferritin today.

## 2018-03-03 ENCOUNTER — Other Ambulatory Visit: Payer: Self-pay | Admitting: Internal Medicine

## 2018-03-14 DIAGNOSIS — H353221 Exudative age-related macular degeneration, left eye, with active choroidal neovascularization: Secondary | ICD-10-CM | POA: Diagnosis not present

## 2018-03-15 ENCOUNTER — Other Ambulatory Visit: Payer: Self-pay | Admitting: Radiology

## 2018-03-15 DIAGNOSIS — Z7901 Long term (current) use of anticoagulants: Principal | ICD-10-CM

## 2018-03-15 DIAGNOSIS — Z5181 Encounter for therapeutic drug level monitoring: Secondary | ICD-10-CM

## 2018-03-16 ENCOUNTER — Other Ambulatory Visit (INDEPENDENT_AMBULATORY_CARE_PROVIDER_SITE_OTHER): Payer: Medicare Other

## 2018-03-16 DIAGNOSIS — Z5181 Encounter for therapeutic drug level monitoring: Secondary | ICD-10-CM

## 2018-03-16 DIAGNOSIS — Z7901 Long term (current) use of anticoagulants: Secondary | ICD-10-CM | POA: Diagnosis not present

## 2018-03-16 LAB — PROTIME-INR
INR: 2.2 ratio — ABNORMAL HIGH (ref 0.8–1.0)
PROTHROMBIN TIME: 25.4 s — AB (ref 9.6–13.1)

## 2018-04-05 ENCOUNTER — Other Ambulatory Visit: Payer: Self-pay | Admitting: Internal Medicine

## 2018-04-09 ENCOUNTER — Telehealth: Payer: Self-pay | Admitting: Podiatry

## 2018-04-09 NOTE — Telephone Encounter (Signed)
I'm a pt of Dr. Amalia Hailey and I have an appointment with him on 30 July. I wanted to speak to the nurse about this big that I have on the right foot. He knows, I guess he's got a record of it, that its so thick and everything. He had made the statement to me the last time I saw him that we wouldn't have anymore surgery but this toe is so thick with the skin or fungus under it that it just throbs. I jerk sometimes with the pain on it sometimes. I just wanted to speak to her to have her speak to him about and see what I need to work out. My telephone number is [phone] or [phone]. Thank you.

## 2018-04-13 ENCOUNTER — Other Ambulatory Visit: Payer: Self-pay | Admitting: Radiology

## 2018-04-13 DIAGNOSIS — Z5181 Encounter for therapeutic drug level monitoring: Secondary | ICD-10-CM

## 2018-04-13 DIAGNOSIS — Z7901 Long term (current) use of anticoagulants: Principal | ICD-10-CM

## 2018-04-16 ENCOUNTER — Other Ambulatory Visit (INDEPENDENT_AMBULATORY_CARE_PROVIDER_SITE_OTHER): Payer: Medicare Other

## 2018-04-16 DIAGNOSIS — Z7901 Long term (current) use of anticoagulants: Secondary | ICD-10-CM

## 2018-04-16 DIAGNOSIS — Z5181 Encounter for therapeutic drug level monitoring: Secondary | ICD-10-CM

## 2018-04-16 LAB — PROTIME-INR
INR: 2.2 ratio — AB (ref 0.8–1.0)
Prothrombin Time: 24.9 s — ABNORMAL HIGH (ref 9.6–13.1)

## 2018-04-24 ENCOUNTER — Ambulatory Visit: Payer: BC Managed Care – PPO | Admitting: Podiatry

## 2018-05-02 ENCOUNTER — Other Ambulatory Visit: Payer: Self-pay | Admitting: Internal Medicine

## 2018-05-08 ENCOUNTER — Ambulatory Visit: Payer: BC Managed Care – PPO | Admitting: Podiatry

## 2018-05-10 ENCOUNTER — Telehealth: Payer: Self-pay | Admitting: *Deleted

## 2018-05-10 DIAGNOSIS — I10 Essential (primary) hypertension: Secondary | ICD-10-CM

## 2018-05-10 DIAGNOSIS — E7800 Pure hypercholesterolemia, unspecified: Secondary | ICD-10-CM

## 2018-05-10 DIAGNOSIS — R739 Hyperglycemia, unspecified: Secondary | ICD-10-CM

## 2018-05-10 DIAGNOSIS — I4819 Other persistent atrial fibrillation: Secondary | ICD-10-CM

## 2018-05-10 NOTE — Telephone Encounter (Signed)
Please place future orders for lab appt next week.

## 2018-05-11 NOTE — Telephone Encounter (Signed)
Orders placed for labs

## 2018-05-15 ENCOUNTER — Telehealth: Payer: Self-pay | Admitting: *Deleted

## 2018-05-15 ENCOUNTER — Other Ambulatory Visit (INDEPENDENT_AMBULATORY_CARE_PROVIDER_SITE_OTHER): Payer: Medicare Other

## 2018-05-15 DIAGNOSIS — R739 Hyperglycemia, unspecified: Secondary | ICD-10-CM | POA: Diagnosis not present

## 2018-05-15 DIAGNOSIS — E7800 Pure hypercholesterolemia, unspecified: Secondary | ICD-10-CM

## 2018-05-15 DIAGNOSIS — I481 Persistent atrial fibrillation: Secondary | ICD-10-CM

## 2018-05-15 DIAGNOSIS — I10 Essential (primary) hypertension: Secondary | ICD-10-CM | POA: Diagnosis not present

## 2018-05-15 DIAGNOSIS — I4819 Other persistent atrial fibrillation: Secondary | ICD-10-CM

## 2018-05-15 LAB — POCT GLYCOSYLATED HEMOGLOBIN (HGB A1C): HEMOGLOBIN A1C: 5.6 % (ref 4.0–5.6)

## 2018-05-15 LAB — BASIC METABOLIC PANEL
BUN: 14 mg/dL (ref 6–23)
CALCIUM: 10 mg/dL (ref 8.4–10.5)
CO2: 31 mEq/L (ref 19–32)
CREATININE: 0.84 mg/dL (ref 0.40–1.20)
Chloride: 104 mEq/L (ref 96–112)
GFR: 68.51 mL/min (ref >60.00)
Glucose, Bld: 100 mg/dL — ABNORMAL HIGH (ref 70–99)
Potassium: 4.3 mEq/L (ref 3.5–5.1)
Sodium: 140 mEq/L (ref 135–145)

## 2018-05-15 LAB — HEPATIC FUNCTION PANEL
ALBUMIN: 4.1 g/dL (ref 3.5–5.2)
ALT: 12 U/L (ref 0–35)
AST: 14 U/L (ref 0–37)
Alkaline Phosphatase: 117 U/L (ref 39–117)
Bilirubin, Direct: 0.1 mg/dL (ref 0.0–0.3)
TOTAL PROTEIN: 6.4 g/dL (ref 6.0–8.3)
Total Bilirubin: 0.5 mg/dL (ref 0.2–1.2)

## 2018-05-15 LAB — PROTIME-INR
INR: 2.7 ratio — AB (ref 0.8–1.0)
Prothrombin Time: 31.5 s — ABNORMAL HIGH (ref 9.6–13.1)

## 2018-05-15 NOTE — Telephone Encounter (Signed)
Pt had other labs other than PT/INR-I drew extra. Please let me know if you would like me to release all of them.

## 2018-05-15 NOTE — Addendum Note (Signed)
Addended by: Leeanne Rio on: 05/15/2018 12:10 PM   Modules accepted: Orders

## 2018-05-15 NOTE — Addendum Note (Signed)
Addended by: Leeanne Rio on: 05/15/2018 11:02 AM   Modules accepted: Orders

## 2018-05-15 NOTE — Telephone Encounter (Signed)
Yes.  I would like you to send all labs.  Thanks

## 2018-05-15 NOTE — Telephone Encounter (Signed)
All orders were released.

## 2018-05-22 ENCOUNTER — Ambulatory Visit: Payer: Self-pay | Admitting: Internal Medicine

## 2018-05-22 ENCOUNTER — Emergency Department
Admission: EM | Admit: 2018-05-22 | Discharge: 2018-05-22 | Disposition: A | Discharge status: Home / Self Care | Payer: Medicare Other | Attending: Emergency Medicine | Admitting: Emergency Medicine

## 2018-05-22 ENCOUNTER — Encounter: Payer: Self-pay | Admitting: Emergency Medicine

## 2018-05-22 ENCOUNTER — Emergency Department: Payer: Medicare Other

## 2018-05-22 ENCOUNTER — Telehealth: Payer: Self-pay

## 2018-05-22 DIAGNOSIS — Y92003 Bedroom of unspecified non-institutional (private) residence as the place of occurrence of the external cause: Secondary | ICD-10-CM | POA: Diagnosis not present

## 2018-05-22 DIAGNOSIS — W01190A Fall on same level from slipping, tripping and stumbling with subsequent striking against furniture, initial encounter: Secondary | ICD-10-CM | POA: Diagnosis not present

## 2018-05-22 DIAGNOSIS — S0003XA Contusion of scalp, initial encounter: Secondary | ICD-10-CM | POA: Insufficient documentation

## 2018-05-22 DIAGNOSIS — Y9384 Activity, sleeping: Secondary | ICD-10-CM | POA: Insufficient documentation

## 2018-05-22 DIAGNOSIS — Z85820 Personal history of malignant melanoma of skin: Secondary | ICD-10-CM | POA: Diagnosis not present

## 2018-05-22 DIAGNOSIS — Z853 Personal history of malignant neoplasm of breast: Secondary | ICD-10-CM | POA: Diagnosis not present

## 2018-05-22 DIAGNOSIS — Y999 Unspecified external cause status: Secondary | ICD-10-CM | POA: Insufficient documentation

## 2018-05-22 DIAGNOSIS — Z85850 Personal history of malignant neoplasm of thyroid: Secondary | ICD-10-CM | POA: Diagnosis not present

## 2018-05-22 DIAGNOSIS — S0990XA Unspecified injury of head, initial encounter: Secondary | ICD-10-CM | POA: Diagnosis not present

## 2018-05-22 DIAGNOSIS — M545 Low back pain: Secondary | ICD-10-CM | POA: Insufficient documentation

## 2018-05-22 DIAGNOSIS — S8991XA Unspecified injury of right lower leg, initial encounter: Secondary | ICD-10-CM | POA: Diagnosis not present

## 2018-05-22 DIAGNOSIS — M549 Dorsalgia, unspecified: Secondary | ICD-10-CM | POA: Diagnosis not present

## 2018-05-22 DIAGNOSIS — M25551 Pain in right hip: Secondary | ICD-10-CM | POA: Diagnosis not present

## 2018-05-22 DIAGNOSIS — R51 Headache: Secondary | ICD-10-CM | POA: Insufficient documentation

## 2018-05-22 DIAGNOSIS — M25561 Pain in right knee: Secondary | ICD-10-CM | POA: Diagnosis not present

## 2018-05-22 DIAGNOSIS — S8001XA Contusion of right knee, initial encounter: Secondary | ICD-10-CM

## 2018-05-22 DIAGNOSIS — W19XXXA Unspecified fall, initial encounter: Secondary | ICD-10-CM

## 2018-05-22 DIAGNOSIS — I10 Essential (primary) hypertension: Secondary | ICD-10-CM | POA: Insufficient documentation

## 2018-05-22 DIAGNOSIS — S79911A Unspecified injury of right hip, initial encounter: Secondary | ICD-10-CM | POA: Diagnosis not present

## 2018-05-22 LAB — BASIC METABOLIC PANEL
Anion gap: 7 (ref 5–15)
BUN: 15 mg/dL (ref 8–23)
CALCIUM: 9.4 mg/dL (ref 8.9–10.3)
CO2: 28 mmol/L (ref 22–32)
Chloride: 103 mmol/L (ref 98–111)
Creatinine, Ser: 0.74 mg/dL (ref 0.44–1.00)
GFR calc Af Amer: >60 mL/min (ref >60)
GFR calc non Af Amer: >60 mL/min (ref >60)
GLUCOSE: 174 mg/dL — AB (ref 70–99)
POTASSIUM: 3.6 mmol/L (ref 3.5–5.1)
Sodium: 138 mmol/L (ref 135–145)

## 2018-05-22 LAB — CBC
HCT: 36.2 % (ref 35.0–47.0)
HEMOGLOBIN: 12.3 g/dL (ref 12.0–16.0)
MCH: 32 pg (ref 26.0–34.0)
MCHC: 34 g/dL (ref 32.0–36.0)
MCV: 94.2 fL (ref 80.0–100.0)
PLATELETS: 216 10*3/uL (ref 150–440)
RBC: 3.84 MIL/uL (ref 3.80–5.20)
RDW: 12.9 % (ref 11.5–14.5)
WBC: 6.1 10*3/uL (ref 3.6–11.0)

## 2018-05-22 LAB — URINALYSIS, COMPLETE (UACMP) WITH MICROSCOPIC
BILIRUBIN URINE: NEGATIVE
Bacteria, UA: NONE SEEN
GLUCOSE, UA: NEGATIVE mg/dL
Hgb urine dipstick: NEGATIVE
KETONES UR: NEGATIVE mg/dL
LEUKOCYTES UA: NEGATIVE
Nitrite: NEGATIVE
PH: 5 (ref 5.0–8.0)
Protein, ur: NEGATIVE mg/dL
Specific Gravity, Urine: 1.017 (ref 1.005–1.030)

## 2018-05-22 LAB — PROTIME-INR
INR: 2.26
Prothrombin Time: 24.8 seconds — ABNORMAL HIGH (ref 11.4–15.2)

## 2018-05-22 MED ORDER — TRAMADOL HCL 50 MG PO TABS: 50.0000 mg | ORAL_TABLET | Freq: Four times a day (QID) | ORAL | 0 refills | Status: DC | PRN

## 2018-05-22 MED ORDER — ACETAMINOPHEN 500 MG PO TABS
1000.0000 mg | ORAL_TABLET | Freq: Once | ORAL | Status: AC
  Administered 2018-05-22: 1000 mg via ORAL
  Filled 2018-05-22: qty 2

## 2018-05-22 NOTE — ED Provider Notes (Signed)
[facility] Emergency Department Provider Note       Time seen: ----------------------------------------- 9:24 AM on 05/22/2018 -----------------------------------------   I have reviewed the triage vital signs and the nursing notes.  HISTORY   Chief Complaint Fall and Head Injury    HPI Kerri Mills is a 84 y.o. female with a history of allergies, anxiety, clotting disorder, depression, hyperlipidemia, hypertension, breast cancer who presents to the ED for a fall.  Patient states she thinks she was dreaming and when she went to get up she fell striking her head on her nightstand.  She states she is on Coumadin.  She is also complaining of soreness on her right hip and knee.  The right knee is chronically swollen but states this is worse than normal.  She denies recent illness, dizziness or other complaints  Past Medical History:  Diagnosis Date  . Allergy   . Anxiety   . Arthritis   . Breast CA ([place])   . Breast cancer ([employer]) 2011   LT LUMPECTOMY  . Clotting disorder ([place])   . Colitis   . Depression   . Diverticulitis 2013  . Gastric ulcer   . GERD (gastroesophageal reflux disease)   . Heart disease   . Hypercholesterolemia   . Hypertension   . Infiltrating lobular carcinoma of left breast 2011   T2,N0, ER: 90%; PR 0%; Her 2 neu not amplified. [facility]).  . Melanoma ([place]) 1997  . Melanoma in situ of upper extremity ([place]) 03/19/2011  . Personal history of radiation therapy 2011   BREAST CA  . Seroma    HISTORY OF LFT BREAST  . Sleep apnea   . Thyroid cancer [facility]) 1992    Patient Active Problem List   Diagnosis Date Noted  . Anemia 02/15/2018  . Bilateral knee pain 11/12/2017  . Atrial fibrillation with rapid ventricular response ([place])   . Demand ischemia ([place])   . Essential hypertension   . Chest pain 09/29/2017  . Dermatitis 09/26/2017  . Osteoarthritis of both knees 06/12/2017  . Osteoporosis, senile 06/12/2017  . Right  leg swelling 05/25/2017  . Joint ache 05/25/2017  . Hypercholesterolemia 02/17/2017  . Cough 01/23/2017  . Hyperglycemia 06/18/2016  . Abnormal mammogram of left breast 01/21/2015  . Knee pain, left 11/19/2014  . Diverticulosis of colon without hemorrhage 05/07/2014  . Diverticulosis of large intestine 05/07/2014  . History of breast cancer 01/20/2014  . Stress 06/15/2013  . Obstructive sleep apnea 06/15/2013  . Atrial fibrillation ([place]) 06/15/2013  . Posttraumatic hematoma of left breast 02/19/2013  . Lump or mass in breast 02/19/2013  . Infiltrating lobular carcinoma of left breast 03/19/2011  . Melanoma in situ of upper extremity ([place]) 03/19/2011  . Thyroid cancer ([place]) 03/19/2011  . GERD (gastroesophageal reflux disease)   . GERD 05/26/2009    Past Surgical History:  Procedure Laterality Date  . ABDOMINAL HYSTERECTOMY  1973   partial  . BREAST BIOPSY Left 02-13-13   BENIGN BREAST TISSUE WITH CHANGES CONSISTENT WITH FAT NECROSIS  . BREAST BIOPSY Left 01/21/2015   bx done in [place] office 11:00 left 6-8cmfn  . BREAST EXCISIONAL BIOPSY Left 1995   neg  . BREAST EXCISIONAL BIOPSY Left 2011   Breast cancer radiation  . BREAST LUMPECTOMY Left 2011   BREAST CA  . CARDIAC CATHETERIZATION    . CHOLECYSTECTOMY    . COLONOSCOPY  2013  . MELANOMA EXCISION     RT UPPER ARM  . PARTIAL  HYSTERECTOMY     bleeding, ovaries in place.    . THYROID SURGERY  1992   FOR THYROID CANCER  . TONSILLECTOMY      Allergies Atorvastatin; Lipitor [atorvastatin calcium]; and Penicillins  Social History Social History   Tobacco Use  . Smoking status: Never Smoker  . Smokeless tobacco: Never Used  Substance Use Topics  . Alcohol use: Yes    Alcohol/week: 0.0 standard drinks    Comment: social drinking. average times a week  . Drug use: No   Review of Systems Constitutional: Negative for fever. Cardiovascular: Negative for chest pain. Respiratory: Negative for shortness of  breath. Gastrointestinal: Negative for abdominal pain, vomiting and diarrhea. Musculoskeletal: Positive for right hip and right knee pain, low back pain Skin: Negative for rash. Neurological: Positive for headache  All systems negative/normal/unremarkable except as stated in the HPI  ____________________________________________   PHYSICAL EXAM:  VITAL SIGNS: ED Triage Vitals  Enc Vitals Group     BP 05/22/18 0844 (!) 151/78     Pulse Rate 05/22/18 0844 76     Resp 05/22/18 0844 19     Temp 05/22/18 0844 98.4 F (36.9 C)     Temp Source 05/22/18 0844 Oral     SpO2 05/22/18 0844 97 %     Weight 05/22/18 0845 178 lb (80.7 kg)     Height 05/22/18 0845 5\' 5"  (1.651 m)     Head Circumference --      Peak Flow --      Pain Score 05/22/18 0845 7     Pain Loc --      Pain Edu? --      Excl. in [place]? --    Constitutional: Alert and oriented. Well appearing and in no distress. Eyes: Conjunctivae are normal. Normal extraocular movements. ENT   Head: Normocephalic, posterior right parietal scalp contusion laceration   Nose: No congestion/rhinnorhea.   Mouth/Throat: Mucous membranes are moist.   Neck: No stridor. Cardiovascular: Normal rate, regular rhythm. No murmurs, rubs, or gallops. Respiratory: Normal respiratory effort without tachypnea nor retractions. Breath sounds are clear and equal bilaterally. No wheezes/rales/rhonchi. Gastrointestinal: Soft and nontender. Normal bowel sounds Musculoskeletal: Pain with range of motion of the right knee, right knee swelling is noted.  Right hip tenderness is noted as well Neurologic:  Normal speech and language. No gross focal neurologic deficits are appreciated.  Skin:  Skin is warm, dry and intact. No rash noted. Psychiatric: Mood and affect are normal. Speech and behavior are normal.  ____________________________________________  EKG: Interpreted by me.  Sinus rhythm the rate of 71 bpm, normal PR interval, LVH, normal  QT  ____________________________________________  ED COURSE:  As part of my medical decision making, I reviewed the following data within the [place] History obtained from family if available, nursing notes, old chart and ekg, as well as notes from prior ED visits. Patient presented for a fall, we will assess with labs and imaging as indicated at this time.   Procedures ____________________________________________   LABS (pertinent positives/negatives)  Labs Reviewed  BASIC METABOLIC PANEL - Abnormal; Notable for the following components:      Result Value   Glucose, Bld 174 (*)    All other components within normal limits  PROTIME-INR - Abnormal; Notable for the following components:   Prothrombin Time 24.8 (*)    All other components within normal limits  CBC  URINALYSIS, COMPLETE (UACMP) WITH MICROSCOPIC    RADIOLOGY Images were viewed by me  CT head, lumbar spine, right knee x-rays, right hip x-rays Did not reveal any acute processes, she has age-related arthritis but no other acute process ____________________________________________  DIFFERENTIAL DIAGNOSIS   Fall, contusion, fracture, dislocation  FINAL ASSESSMENT AND PLAN  Fall, minor head injury, contusion   Plan: The patient had presented for a fall. Patient's labs did not reveal any acute process, Coumadin was therapeutic. Patient's imaging were negative for any fracture.  She is cleared for outpatient follow-up with pain medicine as needed.   Laurence Aly, MD   Note: This note was generated in part or whole with voice recognition software. Voice recognition is usually quite accurate but there are transcription errors that can and very often do occur. I apologize for any typographical errors that were not detected and corrected.     Earleen Newport, MD 05/22/18 1026

## 2018-05-22 NOTE — Telephone Encounter (Signed)
Patient has been scheduled for next Tuesday 05/29/18

## 2018-05-22 NOTE — Telephone Encounter (Signed)
Patient informed to go to ED by [facility] nurse

## 2018-05-22 NOTE — Telephone Encounter (Signed)
She called in c/o falling out of bed this morning around 6:00.   She is on Warfarin 3 mg.   She is c/o having a knot on the back of her head that is growing and a knot on her right knee that is growing.   Her daughter is with her and was able to look at her head and give me the below descriptions.   The pt had clear, appropriate speech.  Was alert and oriented and able to clearly tell me what happened.  I have referred her to the ED due to the head injury and being on Warfarin and the knot on her head really hurting and swelling as well as the knot on the front of her right knee. Her daughter is going to take her to [facility] now.  I routed a note to Dr. Nicki Reaper so she would be aware.  Reason for Disposition . Taking Coumadin (warfarin) or other strong blood thinner, or known bleeding disorder (e.g., thrombocytopenia)  Answer Assessment - Initial Assessment Questions 1. MECHANISM: "How did the injury happen?" For falls, ask: "What height did you fall from?" and "What surface did you fall against?"      I think was dreaming and I thought my mother in law had come into my bathroom.   (She's deceased).    I kept calling "grandma".    So I don't really know what I hit my head on.   Happened a little before 6:00 this morning.    I was on the floor. 2. ONSET: "When did the injury happen?" (Minutes or hours ago)      See above 3. NEUROLOGIC SYMPTOMS: "Was there any loss of consciousness?" "Are there any other neurological symptoms?"      No   I knew my knee and head were really hurting. 4. MENTAL STATUS: "Does the person know who he is, who you are, and where he is?"      Yes   I was able to get up and call my daughter.   She is with me now. 5. LOCATION: "What part of the head was hit?"      Back side off to right of center.   6. SCALP APPEARANCE: "What does the scalp look like? Is it bleeding now?" If so, ask: "Is it difficult to stop?"      It's a large knot and now black and  blue.   Size of a 50 cent piece per daughter. 7. SIZE: For cuts, bruises, or swelling, ask: "How large is it?" (e.g., inches or centimeters)      See above 8. PAIN: "Is there any pain?" If so, ask: "How bad is it?"  (e.g., Scale 1-10; or mild, moderate, severe)     My head is sore.  When I move I feel it around the knot. 9. TETANUS: For any breaks in the skin, ask: "When was the last tetanus booster?"     I don't know 10. OTHER SYMPTOMS: "Do you have any other symptoms?" (e.g., neck pain, vomiting)       My right knee.   I have a knot on it on the front.   It has a knot that is swelling and turning dark. 11. PREGNANCY: "Is there any chance you are pregnant?" "When was your last menstrual period?"       N/A  Protocols used: HEAD INJURY-A-AH

## 2018-05-22 NOTE — ED Notes (Signed)
Pt remains at scan at this time

## 2018-05-22 NOTE — ED Notes (Signed)
First Nurse Note:  Patient ambulatory to ED complaining of fall this AM, states she was thinks she was dreaming and went to get up and fell striking head on night stand.  States she is on Coumadin.  Has bruised area on back of head.  Denies known LOC.  Alert and oriented.  Placed in [place].

## 2018-05-22 NOTE — Telephone Encounter (Signed)
Copied from [place] [phone]. Topic: Appointment Scheduling - Scheduling Inquiry for Clinic >> May 22, 2018 11:54 AM Hewitt Shorts wrote: Pt was seen in the ER this morning because she fell and hit her head and leg -he was told then to follow up with her PCP which does not have anything soon can you please call and work in  [employer] number [phone]

## 2018-05-22 NOTE — ED Notes (Signed)
Pt back from CT and xray at this time

## 2018-05-22 NOTE — Telephone Encounter (Signed)
Left message to call back  

## 2018-05-22 NOTE — ED Triage Notes (Signed)
Pt reports unsure what happened but she thinks she was dreaming and all she remembers is that she was getting out of bed and then was on the floor with her head hurting and right knee and hip. Pt reports unsure if she got dizzy or what happened. Pt also unsure if she had LOC. Pt reports she is on coumadin.

## 2018-05-22 NOTE — ED Notes (Signed)
Pt to x-ray and CT at this time.

## 2018-05-24 DIAGNOSIS — H353221 Exudative age-related macular degeneration, left eye, with active choroidal neovascularization: Secondary | ICD-10-CM | POA: Diagnosis not present

## 2018-05-24 NOTE — Telephone Encounter (Signed)
LMTCB

## 2018-05-24 NOTE — Telephone Encounter (Signed)
Patient returning call, informed of message below, patient would like to speak with [place].

## 2018-05-29 ENCOUNTER — Ambulatory Visit (INDEPENDENT_AMBULATORY_CARE_PROVIDER_SITE_OTHER): Payer: Medicare Other | Admitting: Internal Medicine

## 2018-05-29 ENCOUNTER — Ambulatory Visit
Admission: RE | Admit: 2018-05-29 | Discharge: 2018-05-29 | Disposition: A | Discharge status: Home / Self Care | Payer: Medicare Other | Source: Ambulatory Visit | Attending: Internal Medicine | Admitting: Internal Medicine

## 2018-05-29 ENCOUNTER — Other Ambulatory Visit: Payer: Medicare Other

## 2018-05-29 VITALS — BP 122/72 | HR 69 | Temp 97.8°F | Resp 18 | Wt 179.6 lb

## 2018-05-29 DIAGNOSIS — W19XXXD Unspecified fall, subsequent encounter: Secondary | ICD-10-CM

## 2018-05-29 DIAGNOSIS — M7989 Other specified soft tissue disorders: Secondary | ICD-10-CM

## 2018-05-29 DIAGNOSIS — D649 Anemia, unspecified: Secondary | ICD-10-CM | POA: Diagnosis not present

## 2018-05-29 DIAGNOSIS — I4819 Other persistent atrial fibrillation: Secondary | ICD-10-CM

## 2018-05-29 DIAGNOSIS — I481 Persistent atrial fibrillation: Secondary | ICD-10-CM

## 2018-05-29 DIAGNOSIS — M25561 Pain in right knee: Secondary | ICD-10-CM | POA: Diagnosis not present

## 2018-05-29 DIAGNOSIS — K219 Gastro-esophageal reflux disease without esophagitis: Secondary | ICD-10-CM

## 2018-05-29 DIAGNOSIS — I10 Essential (primary) hypertension: Secondary | ICD-10-CM

## 2018-05-29 MED ORDER — LOVASTATIN 40 MG PO TABS: 40.0000 mg | ORAL_TABLET | Freq: Every day | ORAL | 1 refills | Status: DC

## 2018-05-29 NOTE — Progress Notes (Addendum)
Patient ID: Kerri Mills, female   DOB: 18-Sep-1931, 84 y.o.   MRN: [id number]   Subjective:    Patient ID: Kerri Mills, female    DOB: August 21, 1931, 84 y.o.   MRN: [id number]  HPI  Patient here for ED follow up.  She is accompanied by her daughter.  history obtained from both of them.  Was seen 05/22/18 after falling getting up out of bed.  She reports that she was dreaming.  Remembers her dream.  Got up out of bed and fell and hit the night stand beside her bed.   Hit her head.  Injured her right knee.  In the ER, had CT head, lumbar spine, right knee xray and right hip xray - did not reveal any acute processes.  She reports that since her fall, her knee has been bothering her more than anything.  Increased swelling. Some redness.  Hurts to flex and ambulate.  She still has hematoma, posterior head.  Getting smaller.  No residual headache.  No chest pain.  No sob.  Bruising noted posterior lateral - back/ribs.  No pain with deep breathing.  Still having some right hip pain.  She does report that she had a fall the first part of the summer.   Went to use the bathroom.  States read a magazine for a while in the bathroom.  When stood, was noticed to be dizzy.  Fell.  Cut her right hip.  Also aggravated her right lower back.  No head injury then.  Did report noticing some intermittent dizziness with position changes.  Is not a continuous problems.  Just occurs occasionally.  Does not feel has worsened since the last fall.  Eating.  No living at [facility].     Past Medical History:  Diagnosis Date  . Allergy   . Anxiety   . Arthritis   . Breast CA ([place])   . Breast cancer ([place]) 2011   LT LUMPECTOMY  . Clotting disorder ([place])   . Colitis   . Depression   . Diverticulitis 2013  . Gastric ulcer   . GERD (gastroesophageal reflux disease)   . Heart disease   . Hypercholesterolemia   . Hypertension   . Infiltrating lobular carcinoma of left breast 2011   T2,N0, ER: 90%; PR 0%; Her 2 neu not amplified.  [facility]).  . Melanoma ([place]) 1997  . Melanoma in situ of upper extremity ([place]) 03/19/2011  . Personal history of radiation therapy 2011   BREAST CA  . Seroma    HISTORY OF LFT BREAST  . Sleep apnea   . Thyroid cancer ([place]) 1992   Past Surgical History:  Procedure Laterality Date  . ABDOMINAL HYSTERECTOMY  1973   partial  . BREAST BIOPSY Left 02-13-13   BENIGN BREAST TISSUE WITH CHANGES CONSISTENT WITH FAT NECROSIS  . BREAST BIOPSY Left 01/21/2015   bx done in [place] office 11:00 left 6-8cmfn  . BREAST EXCISIONAL BIOPSY Left 1995   neg  . BREAST EXCISIONAL BIOPSY Left 2011   Breast cancer radiation  . BREAST LUMPECTOMY Left 2011   BREAST CA  . CARDIAC CATHETERIZATION    . CHOLECYSTECTOMY    . COLONOSCOPY  2013  . MELANOMA EXCISION     RT UPPER ARM  . PARTIAL HYSTERECTOMY     bleeding, ovaries in place.    . THYROID SURGERY  1992   FOR THYROID CANCER  . TONSILLECTOMY     Family History  Problem Relation  Age of Onset  . Heart disease Mother   . Cancer Brother        lung   . Cancer Sister        breast  . Breast cancer Neg Hx    Social History   Socioeconomic History  . Marital status: Widowed    Spouse name: Not on file  . Number of children: Not on file  . Years of education: Not on file  . Highest education level: Not on file  Occupational History  . Not on file  Social Needs  . Financial resource strain: Not on file  . Food insecurity:    Worry: Not on file    Inability: Not on file  . Transportation needs:    Medical: Not on file    Non-medical: Not on file  Tobacco Use  . Smoking status: Never Smoker  . Smokeless tobacco: Never Used  Substance and Sexual Activity  . Alcohol use: Yes    Alcohol/week: 0.0 standard drinks    Comment: social drinking. average times a week  . Drug use: No  . Sexual activity: Never  Lifestyle  . Physical activity:    Days per week: Not on file    Minutes per session: Not on file  . Stress: Not on file    Relationships  . Social connections:    Talks on phone: Not on file    Gets together: Not on file    Attends religious service: Not on file    Active member of club or organization: Not on file    Attends meetings of clubs or organizations: Not on file    Relationship status: Not on file  Other Topics Concern  . Not on file  Social History Narrative   Independent and baseline. Lives by herself    Outpatient Encounter Medications as of 05/29/2018  Medication Sig  . albuterol (PROAIR HFA) 108 (90 Base) MCG/ACT inhaler Inhale 2 puffs into the lungs every 6 (six) hours as needed for wheezing or shortness of breath.  . Cholecalciferol (VITAMIN D) 1000 UNITS capsule Take 1,000 Units by mouth daily.    Marland Kitchen diltiazem (CARDIZEM CD) 120 MG 24 hr capsule Take 1 capsule (120 mg total) by mouth daily.  . DULoxetine (CYMBALTA) 60 MG capsule TAKE 1 CAPSULE (60 MG TOTAL) BY MOUTH AT BEDTIME.  . ferrous sulfate 325 (65 FE) MG tablet Take 325 mg by mouth every morning.  . gabapentin (NEURONTIN) 300 MG capsule TAKE 2 CAPSULES (600 MG TOTAL) BY MOUTH AT BEDTIME.  Marland Kitchen levothyroxine (SYNTHROID, LEVOTHROID) 88 MCG tablet TAKE ONE TABLET BY MOUTH ONCE DAILY BEFORE BREAKFAST  . lovastatin (MEVACOR) 40 MG tablet Take 1 tablet (40 mg total) by mouth daily.  . Multiple Vitamins-Minerals (PRESERVISION AREDS 2) CAPS Take 1 tablet by mouth 2 (two) times daily.   Marland Kitchen nystatin cream (MYCOSTATIN) Apply 1 application topically 2 (two) times daily.  Marland Kitchen omeprazole (PRILOSEC) 20 MG capsule TAKE 1 CAPSULE (20 MG TOTAL) BY MOUTH DAILY.  Marland Kitchen propranolol (INDERAL) 10 MG tablet TAKE 1 TABLET (10 MG TOTAL) BY MOUTH DAILY AS NEEDED. (Patient taking differently: Take 10 mg by mouth daily. )  . traMADol (ULTRAM) 50 MG tablet Take 1 tablet (50 mg total) by mouth every 6 (six) hours as needed.  . vitamin B-12 (CYANOCOBALAMIN) 1000 MCG tablet Take 1,000 mcg by mouth daily.  Marland Kitchen warfarin (COUMADIN) 3 MG tablet TAKE 1 TABLET (3 MG TOTAL) BY MOUTH  DAILY.  . [DISCONTINUED] lovastatin (MEVACOR)  40 MG tablet TAKE 1 TABLET (40 MG TOTAL) BY MOUTH DAILY.  . mupirocin cream (BACTROBAN) 2 % Apply 1 application topically 2 (two) times daily.   No facility-administered encounter medications on file as of 05/29/2018.     Review of Systems  Constitutional: Negative for appetite change and fever.  HENT: Negative for congestion.        Hematoma - posterior head.  States has decreased some in size.    Respiratory: Negative for cough, chest tightness and shortness of breath.   Cardiovascular: Negative for chest pain and palpitations.  Gastrointestinal: Negative for abdominal pain, diarrhea, nausea and vomiting.  Genitourinary: Negative for difficulty urinating and dysuria.  Musculoskeletal:       Knee pain and swelling as outlined.  Some residual right hip pain and low back pain.  Feels the knee is aggravating the hip and back.  Increased pain with walking.    Skin: Negative for color change and rash.  Neurological:       Intermittent positional dizziness/light headedness.  No residual headache.    Psychiatric/Behavioral: Negative for agitation and dysphoric mood.       Objective:    Physical Exam  Constitutional: She appears well-developed and well-nourished. No distress.  HENT:  Nose: Nose normal.  Mouth/Throat: Oropharynx is clear and moist.  Hematoma - posterior head.    Neck: Neck supple. No thyromegaly present.  Cardiovascular: Normal rate.  Rate controlled.    Pulmonary/Chest: Breath sounds normal. No respiratory distress. She has no wheezes.  Abdominal: Soft. Bowel sounds are normal. There is no tenderness.  Musculoskeletal:  Increased swelling and some redness - right knee.  Limited rom.  Increased pain with weight bearing.  Some calf tenderness to palpation.  Some bruising extending from knee into lower leg.    Lymphadenopathy:    She has no cervical adenopathy.  Skin:  Bruising - right posterior/lateral - back/ribs.  Some  redness noted - right knee with bruising and hematoma.    Psychiatric: She has a normal mood and affect. Her behavior is normal.    BP 122/72 (BP Location: Left Arm, Patient Position: Sitting, Cuff Size: Normal)   Pulse 69   Temp 97.8 F (36.6 C) (Oral)   Resp 18   Wt 179 lb 9.6 oz (81.5 kg)   SpO2 96%   BMI 29.89 kg/m  Wt Readings from Last 3 Encounters:  05/29/18 179 lb 9.6 oz (81.5 kg)  05/22/18 178 lb (80.7 kg)  02/15/18 179 lb 9.6 oz (81.5 kg)     Lab Results  Component Value Date   WBC 6.1 05/22/2018   HGB 12.3 05/22/2018   HCT 36.2 05/22/2018   PLT 216 05/22/2018   GLUCOSE 174 (H) 05/22/2018   CHOL 169 02/15/2018   TRIG 198.0 (H) 02/15/2018   HDL 36.60 (L) 02/15/2018   LDLDIRECT 75.0 03/24/2017   LDLCALC 93 02/15/2018   ALT 12 05/15/2018   AST 14 05/15/2018   NA 138 05/22/2018   K 3.6 05/22/2018   CL 103 05/22/2018   CREATININE 0.74 05/22/2018   BUN 15 05/22/2018   CO2 28 05/22/2018   TSH 3.40 10/11/2017   INR 1.9 (H) 05/29/2018   HGBA1C 5.6 05/15/2018    Dg Lumbar Spine 2-3 Views  Result Date: 05/22/2018 CLINICAL DATA:  84 year old female with a history of fall and right hip pain and back pain EXAM: LUMBAR SPINE - 2-3 VIEW COMPARISON:  None. FINDINGS: Lumbar Spine: Lumbar vertebral elements maintain normal alignment without evidence  of anterolisthesis, retrolisthesis, subluxation. No acute fracture line identified. Vertebral body heights maintained. Disc space narrowing throughout the lumbar spine with anterior osteophyte production throughout. Disc space narrowing with endplate changes are most pronounced at L4-L5 and L5-S1 with vacuum disc phenomenon at L4-L5. Facet hypertrophy is most pronounced spanning L3-S1. Cholecystectomy IMPRESSION: Negative for acute fracture malalignment of the lumbar spine. Degenerative disc disease and facet disease worst spanning L3-S1. Electronically Signed   By: Corrie Mckusick D.O.   On: 05/22/2018 09:55   Ct Head Wo  Contrast  Result Date: 05/22/2018 CLINICAL DATA:  [place] today hitting head, the patient is on Coumadin, bruising over the back of the head EXAM: CT HEAD WITHOUT CONTRAST TECHNIQUE: Contiguous axial images were obtained from the base of the skull through the vertex without intravenous contrast. COMPARISON:  None. FINDINGS: Brain: The ventricular system is normal in size and configuration for age with only minimal atrophy present. No significant small vessel ischemic change is seen within the periventricular white matter. Minimal benign-appearing basal ganglial calcification is noted bilaterally. No hemorrhage, mass lesion, or acute infarction is noted. Vascular: No vascular abnormality is seen on this unenhanced study. Skull: On bone window images, the bones appear somewhat osteopenic. However, no calvarial fracture is seen. No significant scalp hematoma is noted. Sinuses/Orbits: Paranasal sinuses are clear. Other: None IMPRESSION: .  Negative unenhanced CT of the brain. Electronically Signed   By: Ivar Drape M.D.   On: 05/22/2018 10:18   Dg Knee Complete 4 Views Right  Result Date: 05/22/2018 CLINICAL DATA:  Patient reports a fall this morning while going to the bathroom and now has right hip and knee pain. There is bruising over the right knee. EXAM: RIGHT KNEE - COMPLETE 4+ VIEW COMPARISON:  Right knee series of May 23, 2017 FINDINGS: The bones are subjectively adequately mineralized. There is mild narrowing of the medial and lateral joint compartments. There is beaking of the tibial spines. The tibial plateaus are intact. Small spurs arise from the periphery of the articular margin of the lateral tibial plateau. Spurs arise from the articular margins of the patella. There is no acute fracture nor dislocation. There is no joint effusion. There is popliteal artery calcification. IMPRESSION: Mild tricompartmental osteoarthritic change similar to that seen previously. No acute bony abnormality.  Electronically Signed   By: David  Martinique M.D.   On: 05/22/2018 09:55   Dg Hip Unilat W Or Wo Pelvis 2-3 Views Right  Result Date: 05/22/2018 CLINICAL DATA:  The patient fell going to the bathroom this morning. EXAM: DG HIP (WITH OR WITHOUT PELVIS) 2-3V RIGHT COMPARISON:  Pelvis and left hip series dated May 23, 2017 FINDINGS: The bony pelvis is subjectively adequately mineralized. No lytic nor blastic bony lesion is seen. There is no acute pelvic fracture. The observed portions of the sacrum are normal. There are degenerative disc changes in the lower lumbar spine. AP and lateral views of the right hip reveal mild asymmetric narrowing of the joint space. There is no acute hip fracture. IMPRESSION: Mild osteoarthritic joint space loss of the right hip. No acute fracture or dislocation. No acute pelvic fracture. Electronically Signed   By: David  Martinique M.D.   On: 05/22/2018 09:56       Assessment & Plan:   Problem List Items Addressed This Visit    Anemia    Recent hgb in ER wnl.  Follow       Atrial fibrillation ([place])    On cardizem.  Rate controlled.  On coumadin.  Recheck pt/inr today.        Relevant Medications   lovastatin (MEVACOR) 40 MG tablet   Other Relevant Orders   INR/PT (Completed)   Essential hypertension    Blood pressure under good control.  Continue same medication regimen.  Follow pressures.  Follow metabolic panel.        Relevant Medications   lovastatin (MEVACOR) 40 MG tablet   Fall    Seen in ER s/p fall.  Head scan and xrays - no acute abnormality.  Persistent right knee pain as outlined.  Refer to ortho as outlined. Feel this is aggravating her right hip and back. Pain more localized to her right knee with ambulation.  Resolving hematoma - posterior head.  No residual headache.  No persistent dizziness.  Does have some positional dizziness/light headedness.  Discussed slow position changes and movements.  Blood pressure looks good.  Follow.        GERD  (gastroesophageal reflux disease)    Controlled on omeprazole.        Right knee pain    Persistent right knee pain and swelling s/p fall.  Increased pain and some swelling into calf and lower leg.  Limited rom.  Increased pain with flexion and weight bearing.  Will obtain lower extremity ultrasound.  Apply bactroban topically - to knee abrasion.  On coumadin.  Swelling and tightness of knee appears to be c/w hematoma - limiting rom.  Will have ortho evaluate.        Relevant Orders   Ambulatory referral to Orthopedic Surgery    Other Visit Diagnoses    Swelling of right lower extremity    -  Primary   Relevant Orders   US Venous Img Lower Unilateral Right (Completed)      I spent 40 minutes with the patient and more than 50% of the time was spent in consultation regarding the above.  Time spent discussing recent fall and current symptoms.  Time also spent discussing further treatment and evaluation.     Einar Pheasant, MD

## 2018-05-30 ENCOUNTER — Other Ambulatory Visit: Payer: Self-pay | Admitting: Internal Medicine

## 2018-05-30 ENCOUNTER — Encounter: Payer: Self-pay | Admitting: Internal Medicine

## 2018-05-30 ENCOUNTER — Telehealth: Payer: Self-pay | Admitting: Internal Medicine

## 2018-05-30 ENCOUNTER — Other Ambulatory Visit: Payer: Medicare Other

## 2018-05-30 DIAGNOSIS — E7800 Pure hypercholesterolemia, unspecified: Secondary | ICD-10-CM

## 2018-05-30 DIAGNOSIS — I4819 Other persistent atrial fibrillation: Secondary | ICD-10-CM

## 2018-05-30 DIAGNOSIS — S8390XA Sprain of unspecified site of unspecified knee, initial encounter: Secondary | ICD-10-CM | POA: Insufficient documentation

## 2018-05-30 DIAGNOSIS — M1710 Unilateral primary osteoarthritis, unspecified knee: Secondary | ICD-10-CM | POA: Insufficient documentation

## 2018-05-30 DIAGNOSIS — I10 Essential (primary) hypertension: Secondary | ICD-10-CM

## 2018-05-30 DIAGNOSIS — W19XXXA Unspecified fall, initial encounter: Secondary | ICD-10-CM | POA: Insufficient documentation

## 2018-05-30 DIAGNOSIS — S80219A Abrasion, unspecified knee, initial encounter: Secondary | ICD-10-CM | POA: Insufficient documentation

## 2018-05-30 DIAGNOSIS — M179 Osteoarthritis of knee, unspecified: Secondary | ICD-10-CM | POA: Insufficient documentation

## 2018-05-30 DIAGNOSIS — R739 Hyperglycemia, unspecified: Secondary | ICD-10-CM

## 2018-05-30 DIAGNOSIS — M810 Age-related osteoporosis without current pathological fracture: Secondary | ICD-10-CM

## 2018-05-30 DIAGNOSIS — M25561 Pain in right knee: Secondary | ICD-10-CM | POA: Insufficient documentation

## 2018-05-30 DIAGNOSIS — S8391XA Sprain of unspecified site of right knee, initial encounter: Secondary | ICD-10-CM | POA: Diagnosis not present

## 2018-05-30 LAB — PROTIME-INR
INR: 1.9 — ABNORMAL HIGH
Prothrombin Time: 19.8 s — ABNORMAL HIGH (ref 9.0–11.5)

## 2018-05-30 MED ORDER — MUPIROCIN CALCIUM 2 % EX CREA: 1.0000 "application " | TOPICAL_CREAM | Freq: Two times a day (BID) | CUTANEOUS | 0 refills | Status: DC

## 2018-05-30 NOTE — Progress Notes (Signed)
Orders placed for f/u labs.  

## 2018-05-30 NOTE — Assessment & Plan Note (Signed)
Persistent right knee pain and swelling s/p fall.  Increased pain and some swelling into calf and lower leg.  Limited rom.  Increased pain with flexion and weight bearing.  Will obtain lower extremity ultrasound.  Apply bactroban topically - to knee abrasion.  On coumadin.  Swelling and tightness of knee appears to be c/w hematoma - limiting rom.  Will have ortho evaluate.

## 2018-05-30 NOTE — Addendum Note (Signed)
Addended by: Alisa Graff on: 05/30/2018 05:57 AM   Modules accepted: Orders

## 2018-05-30 NOTE — Telephone Encounter (Signed)
Copied from [place] [phone]. Topic: General - Other >> May 30, 2018 11:34 AM Cecelia Byars, NT wrote: Reason for CRM: Patient  called  in and said the pharmacy told her that the lovastatin (MEVACOR) 40 MG tablet is no longer covered by her insurance and she would like something else called in she does have a few  pills left , pease call once  this is done , thanks

## 2018-05-30 NOTE — Assessment & Plan Note (Signed)
Seen in ER s/p fall.  Head scan and xrays - no acute abnormality.  Persistent right knee pain as outlined.  Refer to ortho as outlined. Feel this is aggravating her right hip and back. Pain more localized to her right knee with ambulation.  Resolving hematoma - posterior head.  No residual headache.  No persistent dizziness.  Does have some positional dizziness/light headedness.  Discussed slow position changes and movements.  Blood pressure looks good.  Follow.

## 2018-05-30 NOTE — Telephone Encounter (Signed)
Can confirm with pharmacy - not covered.  This is an old generic medication.  If not covered, confirm with pt she has not tried crestor.  If no, then can change lovastatin to crestor 20mg  q day.  Will need liver panel checked with next pt/inr.

## 2018-05-30 NOTE — Telephone Encounter (Signed)
OK to send in alternative?

## 2018-05-30 NOTE — Assessment & Plan Note (Signed)
Recent hgb in ER wnl.  Follow

## 2018-05-30 NOTE — Telephone Encounter (Signed)
LMTCB

## 2018-05-30 NOTE — Assessment & Plan Note (Signed)
Controlled on omeprazole.   

## 2018-05-30 NOTE — Assessment & Plan Note (Signed)
On cardizem.  Rate controlled.  On coumadin.  Recheck pt/inr today.

## 2018-05-30 NOTE — Assessment & Plan Note (Signed)
Blood pressure under good control.  Continue same medication regimen.  Follow pressures.  Follow metabolic panel.   

## 2018-06-03 ENCOUNTER — Other Ambulatory Visit: Payer: Self-pay | Admitting: Internal Medicine

## 2018-06-11 ENCOUNTER — Telehealth: Payer: Self-pay | Admitting: *Deleted

## 2018-06-11 ENCOUNTER — Telehealth: Payer: Self-pay

## 2018-06-11 NOTE — Telephone Encounter (Signed)
Copied from [place] [phone]. Topic: General - Other >> Jun 11, 2018 11:48 AM Judyann Munson wrote: Reason for CRM: Patient is returning [place] call. Please advise

## 2018-06-11 NOTE — Telephone Encounter (Signed)
Copied from [place] [phone]. Topic: General - Other >> Jun 11, 2018 11:48 AM Judyann Munson wrote: Reason for CRM: Patient is returning [place] call. Please advise >> Jun 11, 2018 12:14 PM Waldemar Dickens, Valda Favia wrote: Pt is returning [place] call. She states she is heading out to lunch can she call back around 1:30

## 2018-06-11 NOTE — Telephone Encounter (Signed)
LMTCB

## 2018-06-11 NOTE — Telephone Encounter (Signed)
Just need pt/iinr.  Thanks for catching.

## 2018-06-11 NOTE — Telephone Encounter (Signed)
See other phone note

## 2018-06-11 NOTE — Telephone Encounter (Signed)
Pt has appt on 06/13/18 for Pt/INR. There are additional orders in also. Please let us know if you would like them all drawn.  Thanks

## 2018-06-12 NOTE — Telephone Encounter (Signed)
Pt called and stated that she was wrong and the medication is covered.

## 2018-06-12 NOTE — Telephone Encounter (Signed)
FYI

## 2018-06-13 ENCOUNTER — Other Ambulatory Visit (INDEPENDENT_AMBULATORY_CARE_PROVIDER_SITE_OTHER): Payer: Medicare Other

## 2018-06-13 DIAGNOSIS — I481 Persistent atrial fibrillation: Secondary | ICD-10-CM | POA: Diagnosis not present

## 2018-06-13 DIAGNOSIS — I4819 Other persistent atrial fibrillation: Secondary | ICD-10-CM

## 2018-06-13 LAB — PROTIME-INR
INR: 2.1 ratio — AB (ref 0.8–1.0)
Prothrombin Time: 23.9 s — ABNORMAL HIGH (ref 9.6–13.1)

## 2018-06-14 ENCOUNTER — Other Ambulatory Visit: Payer: Self-pay | Admitting: Internal Medicine

## 2018-06-14 DIAGNOSIS — M1711 Unilateral primary osteoarthritis, right knee: Secondary | ICD-10-CM | POA: Diagnosis not present

## 2018-06-19 ENCOUNTER — Ambulatory Visit (INDEPENDENT_AMBULATORY_CARE_PROVIDER_SITE_OTHER): Payer: Medicare Other | Admitting: Podiatry

## 2018-06-19 ENCOUNTER — Encounter: Payer: Self-pay | Admitting: Podiatry

## 2018-06-19 DIAGNOSIS — L989 Disorder of the skin and subcutaneous tissue, unspecified: Secondary | ICD-10-CM | POA: Diagnosis not present

## 2018-06-19 DIAGNOSIS — M79676 Pain in unspecified toe(s): Secondary | ICD-10-CM | POA: Diagnosis not present

## 2018-06-19 DIAGNOSIS — B351 Tinea unguium: Secondary | ICD-10-CM

## 2018-06-19 NOTE — Progress Notes (Signed)
    Subjective: Patient is a 84 y.o. female presenting to the office today with a chief complaint of painful callus lesions to the bilateral feet that have been present for over a year. Walking and standing increases the pain. She has not done anything for treatment at home. Patient also complains of elongated, thickened nails that cause pain while ambulating in shoes. She is unable to trim her own nails. Patient presents today for further treatment and evaluation.  Past Medical History:  Diagnosis Date  . Allergy   . Anxiety   . Arthritis   . Breast CA ([place])   . Breast cancer ([place]) 2011   LT LUMPECTOMY  . Clotting disorder ([place])   . Colitis   . Depression   . Diverticulitis 2013  . Gastric ulcer   . GERD (gastroesophageal reflux disease)   . Heart disease   . Hypercholesterolemia   . Hypertension   . Infiltrating lobular carcinoma of left breast 2011   T2,N0, ER: 90%; PR 0%; Her 2 neu not amplified. [facility]).  . Melanoma ([place]) 1997  . Melanoma in situ of upper extremity ([place]) 03/19/2011  . Personal history of radiation therapy 2011   BREAST CA  . Seroma    HISTORY OF LFT BREAST  . Sleep apnea   . Thyroid cancer ([place]) 1992    Objective:  Physical Exam General: Alert and oriented x3 in no acute distress  Dermatology: Hyperkeratotic lesions present on the bilateral feet x 2. Pain on palpation with a central nucleated core noted. Skin is warm, dry and supple bilateral lower extremities. Negative for open lesions or macerations. Nails are tender, long, thickened and dystrophic with subungual debris, consistent with onychomycosis, 1-5 bilateral. No signs of infection noted.  Vascular: Palpable pedal pulses bilaterally. No edema or erythema noted. Capillary refill within normal limits.  Neurological: Epicritic and protective threshold grossly intact bilaterally.   Musculoskeletal Exam: Pain on palpation at the keratotic lesion noted. Range of motion within normal  limits bilateral. Muscle strength 5/5 in all groups bilateral.  Assessment: 1. Onychodystrophic nails 1-5 bilateral with hyperkeratosis of nails.  2. Onychomycosis of nail due to dermatophyte bilateral 3. Pre-ulcerative callus lesions noted to the bilateral feet x 2   Plan of Care:  1. Patient evaluated. 2. Excisional debridement of keratoic lesion using a chisel blade was performed without incident.  3. Dressed with light dressing. 4. Mechanical debridement of nails 1-5 bilaterally performed using a nail nipper. Filed with dremel without incident.  5. Patient is to return to the clinic in 3 months.   Edrick Kins, DPM [facility]  Dr. Edrick Kins, [address]                                        [place], [place] [address]                Office [phone]  Fax [phone]

## 2018-07-04 ENCOUNTER — Ambulatory Visit: Payer: Medicare Other

## 2018-07-19 ENCOUNTER — Other Ambulatory Visit (INDEPENDENT_AMBULATORY_CARE_PROVIDER_SITE_OTHER): Payer: Medicare Other

## 2018-07-19 ENCOUNTER — Ambulatory Visit (INDEPENDENT_AMBULATORY_CARE_PROVIDER_SITE_OTHER): Payer: Medicare Other

## 2018-07-19 ENCOUNTER — Other Ambulatory Visit: Payer: Self-pay | Admitting: Internal Medicine

## 2018-07-19 VITALS — BP 132/72 | HR 78 | Temp 97.6°F | Resp 16 | Ht 65.0 in | Wt 176.8 lb

## 2018-07-19 DIAGNOSIS — Z0000 Encounter for general adult medical examination without abnormal findings: Secondary | ICD-10-CM

## 2018-07-19 DIAGNOSIS — I10 Essential (primary) hypertension: Secondary | ICD-10-CM | POA: Diagnosis not present

## 2018-07-19 DIAGNOSIS — R739 Hyperglycemia, unspecified: Secondary | ICD-10-CM

## 2018-07-19 DIAGNOSIS — I4819 Other persistent atrial fibrillation: Secondary | ICD-10-CM

## 2018-07-19 DIAGNOSIS — M810 Age-related osteoporosis without current pathological fracture: Secondary | ICD-10-CM | POA: Diagnosis not present

## 2018-07-19 DIAGNOSIS — E7800 Pure hypercholesterolemia, unspecified: Secondary | ICD-10-CM

## 2018-07-19 LAB — TSH: TSH: 2.68 u[IU]/mL (ref 0.35–4.50)

## 2018-07-19 LAB — HEPATIC FUNCTION PANEL
ALT: 12 U/L (ref 0–35)
AST: 14 U/L (ref 0–37)
Albumin: 4.1 g/dL (ref 3.5–5.2)
Alkaline Phosphatase: 133 U/L — ABNORMAL HIGH (ref 39–117)
BILIRUBIN DIRECT: 0.1 mg/dL (ref 0.0–0.3)
TOTAL PROTEIN: 6.6 g/dL (ref 6.0–8.3)
Total Bilirubin: 0.4 mg/dL (ref 0.2–1.2)

## 2018-07-19 LAB — BASIC METABOLIC PANEL
BUN: 14 mg/dL (ref 6–23)
CALCIUM: 9.8 mg/dL (ref 8.4–10.5)
CO2: 29 meq/L (ref 19–32)
Chloride: 103 mEq/L (ref 96–112)
Creatinine, Ser: 0.86 mg/dL (ref 0.40–1.20)
GFR: 66.65 mL/min (ref >60.00)
Glucose, Bld: 114 mg/dL — ABNORMAL HIGH (ref 70–99)
Potassium: 4 mEq/L (ref 3.5–5.1)
SODIUM: 141 meq/L (ref 135–145)

## 2018-07-19 LAB — LIPID PANEL
Cholesterol: 168 mg/dL (ref 0–200)
HDL: 41.7 mg/dL (ref >39.00)
LDL CALC: 93 mg/dL (ref 0–99)
NonHDL: 125.99
Total CHOL/HDL Ratio: 4
Triglycerides: 166 mg/dL — ABNORMAL HIGH (ref 0.0–149.0)
VLDL: 33.2 mg/dL (ref 0.0–40.0)

## 2018-07-19 LAB — VITAMIN D 25 HYDROXY (VIT D DEFICIENCY, FRACTURES): VITD: 28.49 ng/mL — AB (ref 30.00–100.00)

## 2018-07-19 LAB — HEMOGLOBIN A1C: Hgb A1c MFr Bld: 6.1 % (ref 4.6–6.5)

## 2018-07-19 NOTE — Patient Instructions (Addendum)
  Kerri Mills , Thank you for taking time to come for your Medicare Wellness Visit. I appreciate your ongoing commitment to your health goals. Please review the following plan we discussed and let me know if I can assist you in the future.   These are the goals we discussed: Goals    . Increase physical activity     Tai-Chi exercises       This is a list of the screening recommended for you and due dates:  Health Maintenance  Topic Date Due  . Flu Shot  04/26/2018  . Mammogram  01/18/2019  . Tetanus Vaccine  11/15/2026  . DEXA scan (bone density measurement)  Completed  . Pneumonia vaccines  Completed

## 2018-07-19 NOTE — Progress Notes (Signed)
Subjective:   Kerri Mills is a 84 y.o. female who presents for Medicare Annual (Subsequent) preventive examination.  Review of Systems:  No ROS.  Medicare Wellness Visit. Additional risk factors are reflected in the social history. Cardiac Risk Factors include: advanced age (>83mn, >>36women);hypertension     Objective:     Vitals: BP 132/72 (BP Location: Right Arm, Patient Position: Sitting, Cuff Size: Normal)   Pulse 78   Temp 97.6 F (36.4 C) (Oral)   Resp 16   Ht 5' 5"  (1.651 m)   Wt 176 lb 12.8 oz (80.2 kg)   SpO2 96%   BMI 29.42 kg/m   Body mass index is 29.42 kg/m.  Advanced Directives 07/19/2018 09/29/2017 09/29/2017 09/29/2017 09/29/2017 07/03/2017 07/01/2016  Does Patient Have a Medical Advance Directive? Yes - Yes No No Yes Yes  Type of AParamedicof [place]upoLiving will - [place] Viking[place] will - - [place] will [place] will  Does patient want to make changes to medical advance directive? No - Patient declined No - Patient declined - - - No - Patient declined -  Copy of HRainierin Chart? Yes - No - copy requested - - No - copy requested No - copy requested  Would patient like information on creating a medical advance directive? - - - No - Patient declined Yes (ED - Information included in AVS) - -    Tobacco Social History   Tobacco Use  Smoking Status Never Smoker  Smokeless Tobacco Never Used     Counseling given: Not Answered   Clinical Intake:  Pre-visit preparation completed: Yes  Pain : No/denies pain     Nutritional Status: BMI 25 -29 Overweight Diabetes: No  How often do you need to have someone help you when you read instructions, pamphlets, or other written materials from your doctor or pharmacy?: 1 - Never  Interpreter Needed?: No     Past Medical History:  Diagnosis Date  . Allergy   . Anxiety   . Arthritis   . Breast  CA ([place]   . Breast cancer ([place]   LT LUMPECTOMY  . [place]   . Colitis   . Depression   . Diverticulitis 2013  . Gastric ulcer   . GERD (gastroesophageal reflux disease)   . Heart disease   . Hypercholesterolemia   . Hypertension   . Infiltrating lobular carcinoma of left breast 2011   T2,N0, ER: 90%; PR 0%; Her 2 neu not amplified. ([facility].  . Melanoma ([place] 1997  . Melanoma in situ of upper extremity ([place] 03/19/2011  . Personal history of radiation therapy 2011   BREAST CA  . Seroma    HISTORY OF LFT BREAST  . Sleep apnea   . Thyroid cancer ([place] 1992   Past Surgical History:  Procedure Laterality Date  . ABDOMINAL HYSTERECTOMY  1973   partial  . BREAST BIOPSY Left 02-13-13   BENIGN BREAST TISSUE WITH CHANGES CONSISTENT WITH FAT NECROSIS  . BREAST BIOPSY Left 01/21/2015   bx done in [place] office 11:00 left 6-8cmfn  . BREAST EXCISIONAL BIOPSY Left 1995   neg  . BREAST EXCISIONAL BIOPSY Left 2011   Breast cancer radiation  . BREAST LUMPECTOMY Left 2011   BREAST CA  . CARDIAC CATHETERIZATION    . CHOLECYSTECTOMY    . COLONOSCOPY  2013  . MELANOMA EXCISION     RT  UPPER ARM  . PARTIAL HYSTERECTOMY     bleeding, ovaries in place.    . THYROID SURGERY  1992   FOR THYROID CANCER  . TONSILLECTOMY     Family History  Problem Relation Age of Onset  . Heart disease Mother   . Cancer Brother        lung   . Cancer Sister        breast  . Breast cancer Neg Hx    Social History   Socioeconomic History  . Marital status: Widowed    Spouse name: Not on file  . Number of children: Not on file  . Years of education: Not on file  . Highest education level: Not on file  Occupational History  . Not on file  Social Needs  . Financial resource strain: Not hard at all  . Food insecurity:    Worry: Never true    Inability: Never true  . Transportation needs:    Medical: No    Non-medical: No  Tobacco Use  . Smoking status: Never  Smoker  . Smokeless tobacco: Never Used  Substance and Sexual Activity  . Alcohol use: Yes    Alcohol/week: 0.0 standard drinks    Comment: social drinking. average times a week  . Drug use: No  . Sexual activity: Never  Lifestyle  . Physical activity:    Days per week: 7 days    Minutes per session: 40 min  . Stress: Not at all  Relationships  . Social connections:    Talks on phone: Not on file    Gets together: Not on file    Attends religious service: Not on file    Active member of club or organization: Not on file    Attends meetings of clubs or organizations: Not on file    Relationship status: Widowed  Other Topics Concern  . Not on file  Social History Narrative   Independent and baseline. Lives by herself    Outpatient Encounter Medications as of 07/19/2018  Medication Sig  . albuterol (PROAIR HFA) 108 (90 Base) MCG/ACT inhaler Inhale 2 puffs into the lungs every 6 (six) hours as needed for wheezing or shortness of breath.  . Cholecalciferol (VITAMIN D) 1000 UNITS capsule Take 1,000 Units by mouth daily.    Marland Kitchen diltiazem (CARDIZEM CD) 120 MG 24 hr capsule Take 1 capsule (120 mg total) by mouth daily.  . DULoxetine (CYMBALTA) 60 MG capsule TAKE 1 CAPSULE (60 MG TOTAL) BY MOUTH AT BEDTIME.  . ferrous sulfate 325 (65 FE) MG tablet Take 325 mg by mouth every morning.  . gabapentin (NEURONTIN) 300 MG capsule TAKE 2 CAPSULES (600 MG TOTAL) BY MOUTH AT BEDTIME.  Marland Kitchen levothyroxine (SYNTHROID, LEVOTHROID) 88 MCG tablet TAKE ONE TABLET BY MOUTH ONCE DAILY BEFORE BREAKFAST  . lovastatin (MEVACOR) 40 MG tablet Take 1 tablet (40 mg total) by mouth daily.  . Multiple Vitamins-Minerals (PRESERVISION AREDS 2) CAPS Take 1 tablet by mouth 2 (two) times daily.   . mupirocin cream (BACTROBAN) 2 % Apply 1 application topically 2 (two) times daily.  Marland Kitchen nystatin cream (MYCOSTATIN) Apply 1 application topically 2 (two) times daily.  Marland Kitchen omeprazole (PRILOSEC) 20 MG capsule TAKE 1 CAPSULE (20 MG  TOTAL) BY MOUTH DAILY.  Marland Kitchen propranolol (INDERAL) 10 MG tablet TAKE 1 TABLET (10 MG TOTAL) BY MOUTH DAILY AS NEEDED.  Marland Kitchen traMADol (ULTRAM) 50 MG tablet Take 1 tablet (50 mg total) by mouth every 6 (six) hours as needed.  Marland Kitchen  vitamin B-12 (CYANOCOBALAMIN) 1000 MCG tablet Take 1,000 mcg by mouth daily.  Marland Kitchen warfarin (COUMADIN) 3 MG tablet TAKE 1 TABLET (3 MG TOTAL) BY MOUTH DAILY.   No facility-administered encounter medications on file as of 07/19/2018.     Activities of Daily Living In your present state of health, do you have any difficulty performing the following activities: 07/19/2018 09/29/2017  Hearing? N N  Vision? N N  Difficulty concentrating or making decisions? N N  Walking or climbing stairs? Y N  Comment Intermittent knee pain -  Dressing or bathing? N N  Doing errands, shopping? N -  Preparing Food and eating ? Y -  Comment Staff prepares meals. Self feeds.  -  Using the Toilet? N -  In the past six months, have you accidently leaked urine? Y -  Comment Managed with daily liner.  -  Do you have problems with loss of bowel control? N -  Managing your Medications? N -  Managing your Finances? Y -  Comment Daughter assists -  Housekeeping or managing your Housekeeping? Y -  Comment Staff assists -  Some recent data might be hidden    Patient Care Team: Einar Pheasant, MD as PCP - General (Internal Medicine) Bary Castilla, Forest Gleason, MD (General Surgery) Rocco Serene, MD (Internal Medicine)    Assessment:   This is a routine wellness examination for [place].  The goal of the wellness visit is to assist the patient how to close the gaps in care and create a preventative care plan for the patient.   The roster of all physicians providing medical care to patient is listed in the Snapshot section of the chart.  Presents with no acute issues to be addressed by pcp today. States she feels good and loves her place of residence.  She is engaged with group activities and made several  friends.  Notes some SOBOE last week when rushing that lead to dizziness; resolved by resting. Offered appointment with pcp, declined.  Denies SOB and dizziness today.  States she has not used her inhaler but will do so at the first sign of SOB going forward.  If symptoms persist or worsen she agrees to contact pcp for follow up.  Taking calcium VIT D as appropriate/Osteoporosis  reviewed.    Safety issues reviewed; Smoke and carbon monoxide detectors in the home. No firearms in the home. Wears seatbelts when driving or riding with others. No violence in the home.  They do not have excessive sun exposure.  Discussed the need for sun protection: hats, long sleeves and the use of sunscreen if there is significant sun exposure.  Patient is alert, normal appearance, oriented to person/place/and time.  Correctly identified the president of the [place] and recalls of 2/3 words. Performs simple calculations and can read correct time from watch face.  Displays appropriate judgement.  No new identified risk were noted.  No failures at ADL's or IADL's.   BMI- discussed the importance of a healthy diet, water intake and the benefits of aerobic exercise.   24 hour diet recall: Regular diet. Good appetite.  Dental- every 6 months.  Sleep patterns- Sleeps without issues.   HTN- followed by pcp.  Last 2 home readings brought today: 07/06/18 is 158/90 p77; 07/13/18 is 134/78 p93.   Exercise Activities and Dietary recommendations Current Exercise Habits: Home exercise routine, Type of exercise: walking;yoga;calisthenics, Frequency (Times/Week): 3, Intensity: Mild  Goals    . Increase physical activity     T[employer]  exercises       Fall Risk Fall Risk  07/19/2018 07/03/2017 07/01/2016 06/13/2016 07/01/2015  Falls in the past year? Yes No Yes No No  Number falls in past yr: 2 or more - 1 - -  Injury with Fall? Yes - Yes - -  Comment Fall 2 months ago. She sought medical care at the ED.  Followed by  pcp. - - - -  Risk for fall due to : History of fall(s) - (No Data) - -  Risk for fall due to: Comment - - Shoe slipped when moving furniture. - -  Follow up Education provided - Education provided;Falls prevention discussed - -    Depression Screen PHQ 2/9 Scores 07/19/2018 07/03/2017 01/27/2017 07/01/2016  PHQ - 2 Score 0 1 2 0  PHQ- 9 Score - 3 3 -     Cognitive Function MMSE - Mini Mental State Exam 07/03/2017 07/01/2016 07/01/2015  Orientation to time 5 5 5   Orientation to Place 5 5 5   Registration 3 3 3   Attention/ Calculation 5 5 5   Recall 3 3 3   Language- name 2 objects 2 2 2   Language- repeat 1 1 1   Language- follow 3 step command 3 3 3   Language- read & follow direction 1 1 1   Write a sentence 1 1 1   Copy design 1 1 1   Total score 30 30 30      6CIT Screen 07/19/2018  What Year? 0 points  What month? 0 points  What time? 0 points  Count back from 20 0 points  Months in reverse 0 points  Repeat phrase 0 points  Total Score 0    Immunization History  Administered Date(s) Administered  . Influenza Split 07/18/2014  . Influenza, High Dose Seasonal PF 07/01/2015, 06/13/2016, 05/23/2017, 06/05/2018  . Influenza,inj,Quad PF,6+ Mos 06/11/2013  . Pneumococcal Conjugate-13 12/18/2013  . Pneumococcal Polysaccharide-23 07/01/2015  . Tdap 11/15/2016  . Zoster Recombinat (Shingrix) 06/19/2018   Screening Tests Health Maintenance  Topic Date Due  . INFLUENZA VACCINE  04/26/2018  . MAMMOGRAM  01/18/2019  . TETANUS/TDAP  11/15/2026  . DEXA SCAN  Completed  . PNA vac Low Risk Adult  Completed      Plan:    End of life planning; Advance aging; Advanced directives discussed. Copy of current HCPOA/Living Will on file.    I have personally reviewed and noted the following in the patient's chart:   . Medical and social history . Use of alcohol, tobacco or illicit drugs  . Current medications and supplements . Functional ability and status . Nutritional  status . Physical activity . Advanced directives . List of other physicians . Hospitalizations, surgeries, and ER visits in previous 12 months . Vitals . Screenings to include cognitive, depression, and falls . Referrals and appointments  In addition, I have reviewed and discussed with patient certain preventive protocols, quality metrics, and best practice recommendations. A written personalized care plan for preventive services as well as general preventive health recommendations were provided to patient.     Varney Biles, LPN  [id number]   Reviewed above information.  Agree with assessment and plan.  Agree with evaluation if any sob, etc.   Dr Nicki Reaper

## 2018-07-23 ENCOUNTER — Telehealth: Payer: Self-pay | Admitting: Internal Medicine

## 2018-07-23 NOTE — Telephone Encounter (Signed)
Pt does not have an appt with you until 07/25/2019. How soon would you like to see her?

## 2018-07-23 NOTE — Telephone Encounter (Signed)
Please schedule appt in the next 8 weeks.  Needs pt/inr within the next week.

## 2018-07-23 NOTE — Telephone Encounter (Signed)
Lab results given to pt. (see result note)  Per Dr. Bary Leriche result note, the pt. Should f/u in office this week.  Attempted to schedule appt. for pt; no available appts. this week or next week, with Dr. Nicki Reaper.  Phone call to [facility], [place].  Was advised to send note to office, and he will discuss with Dr. Nicki Reaper.    Pt. made aware that the office will call her with appt. Information, after discussing with Dr. Nicki Reaper.  Verb. Understanding.

## 2018-07-24 NOTE — Telephone Encounter (Signed)
Left message for patient to call back  

## 2018-07-27 NOTE — Telephone Encounter (Signed)
Left message for patient to call back  

## 2018-08-01 NOTE — Telephone Encounter (Signed)
Left message for pt to call back  °

## 2018-08-02 NOTE — Telephone Encounter (Signed)
Pt returned [place] call. Assist pt with scheduling INR. Due to providers schedule unable to schedule ov in 8 weeks. Please assist pt with scheduling ov with PCP per PCP.

## 2018-08-06 ENCOUNTER — Telehealth: Payer: Self-pay | Admitting: Radiology

## 2018-08-06 ENCOUNTER — Telehealth: Payer: Self-pay

## 2018-08-06 ENCOUNTER — Other Ambulatory Visit: Payer: Self-pay | Admitting: Internal Medicine

## 2018-08-06 DIAGNOSIS — Z7901 Long term (current) use of anticoagulants: Secondary | ICD-10-CM

## 2018-08-06 NOTE — Telephone Encounter (Signed)
Copied from [place] [id number]. Topic: General - Other >> Aug 06, 2018 11:32 AM Leward Quan A wrote: Reason for CRM: Patient called back regarding getting an appointment to see Dr Nicki Reaper she would like an appointment before Christmas holiday. But Dr Nicki Reaper is booked out until Jan. 2020. Patient is requesting a call back please Ph# [phone]

## 2018-08-06 NOTE — Progress Notes (Signed)
Order placed for pt/inr to be drawn 

## 2018-08-06 NOTE — Telephone Encounter (Signed)
See other phone note

## 2018-08-06 NOTE — Telephone Encounter (Signed)
Order placed for pt/inr 

## 2018-08-06 NOTE — Telephone Encounter (Signed)
Pt coming in for labs tomorrow, please place future orders. Thank you.  

## 2018-08-07 ENCOUNTER — Other Ambulatory Visit (INDEPENDENT_AMBULATORY_CARE_PROVIDER_SITE_OTHER): Payer: Medicare Other

## 2018-08-07 DIAGNOSIS — Z7901 Long term (current) use of anticoagulants: Secondary | ICD-10-CM

## 2018-08-07 LAB — PROTIME-INR
INR: 2.4 ratio — ABNORMAL HIGH (ref 0.8–1.0)
Prothrombin Time: 27.7 s — ABNORMAL HIGH (ref 9.6–13.1)

## 2018-08-07 NOTE — Telephone Encounter (Signed)
Pt called and scheduled for 11/15 @1130 

## 2018-08-08 ENCOUNTER — Other Ambulatory Visit: Payer: Self-pay

## 2018-08-08 DIAGNOSIS — Z7901 Long term (current) use of anticoagulants: Secondary | ICD-10-CM

## 2018-08-10 ENCOUNTER — Ambulatory Visit (INDEPENDENT_AMBULATORY_CARE_PROVIDER_SITE_OTHER): Payer: Medicare Other | Admitting: Internal Medicine

## 2018-08-10 ENCOUNTER — Encounter: Payer: Self-pay | Admitting: Internal Medicine

## 2018-08-10 DIAGNOSIS — D649 Anemia, unspecified: Secondary | ICD-10-CM

## 2018-08-10 DIAGNOSIS — M25562 Pain in left knee: Secondary | ICD-10-CM

## 2018-08-10 DIAGNOSIS — I4891 Unspecified atrial fibrillation: Secondary | ICD-10-CM

## 2018-08-10 DIAGNOSIS — I10 Essential (primary) hypertension: Secondary | ICD-10-CM | POA: Diagnosis not present

## 2018-08-10 DIAGNOSIS — E7800 Pure hypercholesterolemia, unspecified: Secondary | ICD-10-CM | POA: Diagnosis not present

## 2018-08-10 DIAGNOSIS — R739 Hyperglycemia, unspecified: Secondary | ICD-10-CM

## 2018-08-10 DIAGNOSIS — M25561 Pain in right knee: Secondary | ICD-10-CM

## 2018-08-10 DIAGNOSIS — M7989 Other specified soft tissue disorders: Secondary | ICD-10-CM

## 2018-08-10 MED ORDER — ALBUTEROL SULFATE HFA 108 (90 BASE) MCG/ACT IN AERS: 2.0000 | INHALATION_SPRAY | Freq: Four times a day (QID) | RESPIRATORY_TRACT | 3 refills | Status: DC | PRN

## 2018-08-10 MED ORDER — TRIAMCINOLONE ACETONIDE 0.1 % EX CREA: 1.0000 "application " | TOPICAL_CREAM | Freq: Two times a day (BID) | CUTANEOUS | 0 refills | Status: DC

## 2018-08-10 NOTE — Progress Notes (Signed)
Patient ID: Kerri Mills, female   DOB: [birth year], 84 y.o.   MRN: [id number]   Subjective:    Patient ID: Kerri Mills, female    DOB: 27-May-1931, 84 y.o.   MRN: [id number]  HPI  Patient here for a scheduled follow up.  Reports she is doing well.  Enjoying living in her new place - [facility]  She is participating in a lot of activities.  Planning to start chair zoomba and water painting.   Feels good.  No chest pain. Breathing stable.  No acid reflux.  No abdominal pain.  Bowels moving.  Does report increased itching in her legs.  Some swelling.  Persistent swelling.  Does appear to be better in the am.  Wears compression hose and still has increased swelling.  No rash.  Some knee pain. Request referral to ortho.     Past Medical History:  Diagnosis Date  . Allergy   . Anxiety   . Arthritis   . Breast CA ([place])   . Breast cancer ([place]) 2011   LT LUMPECTOMY  . Clotting disorder ([place])   . Colitis   . Depression   . Diverticulitis 2013  . Gastric ulcer   . GERD (gastroesophageal reflux disease)   . Heart disease   . Hypercholesterolemia   . Hypertension   . Infiltrating lobular carcinoma of left breast 2011   T2,N0, ER: 90%; PR 0%; Her 2 neu not amplified. [facility]).  . Melanoma ([place]) 1997  . Melanoma in situ of upper extremity ([place]) 03/19/2011  . Personal history of radiation therapy 2011   BREAST CA  . Seroma    HISTORY OF LFT BREAST  . Sleep apnea   . Thyroid cancer ([place]) 1992   Past Surgical History:  Procedure Laterality Date  . ABDOMINAL HYSTERECTOMY  1973   partial  . BREAST BIOPSY Left 02-13-13   BENIGN BREAST TISSUE WITH CHANGES CONSISTENT WITH FAT NECROSIS  . BREAST BIOPSY Left 01/21/2015   bx done in [facility] 11:00 left 6-8cmfn  . BREAST EXCISIONAL BIOPSY Left 1995   neg  . BREAST EXCISIONAL BIOPSY Left 2011   Breast cancer radiation  . BREAST LUMPECTOMY Left 2011   BREAST CA  . CARDIAC CATHETERIZATION    . CHOLECYSTECTOMY    . COLONOSCOPY   2013  . MELANOMA EXCISION     RT UPPER ARM  . PARTIAL HYSTERECTOMY     bleeding, ovaries in place.    . THYROID SURGERY  1992   FOR THYROID CANCER  . TONSILLECTOMY     Family History  Problem Relation Age of Onset  . Heart disease Mother   . Cancer Brother        lung   . Cancer Sister        breast  . Breast cancer Neg Hx    Social History   Socioeconomic History  . Marital status: Widowed    Spouse name: Not on file  . Number of children: Not on file  . Years of education: Not on file  . Highest education level: Not on file  Occupational History  . Not on file  Social Needs  . Financial resource strain: Not hard at all  . Food insecurity:    Worry: Never true    Inability: Never true  . Transportation needs:    Medical: No    Non-medical: No  Tobacco Use  . Smoking status: Never Smoker  . Smokeless tobacco: Never Used  Substance and Sexual Activity  . Alcohol use: Yes    Alcohol/week: 0.0 standard drinks    Comment: social drinking. average times a week  . Drug use: No  . Sexual activity: Never  Lifestyle  . Physical activity:    Days per week: 7 days    Minutes per session: 40 min  . Stress: Not at all  Relationships  . Social connections:    Talks on phone: Not on file    Gets together: Not on file    Attends religious service: Not on file    Active member of club or organization: Not on file    Attends meetings of clubs or organizations: Not on file    Relationship status: Widowed  Other Topics Concern  . Not on file  Social History Narrative   Independent and baseline. Lives by herself    Outpatient Encounter Medications as of 08/10/2018  Medication Sig  . albuterol (PROAIR HFA) 108 (90 Base) MCG/ACT inhaler Inhale 2 puffs into the lungs every 6 (six) hours as needed for wheezing or shortness of breath.  . Cholecalciferol (VITAMIN D) 1000 UNITS capsule Take 1,000 Units by mouth daily.    Marland Kitchen diltiazem (CARDIZEM CD) 120 MG 24 hr capsule Take 1  capsule (120 mg total) by mouth daily.  . DULoxetine (CYMBALTA) 60 MG capsule TAKE 1 CAPSULE (60 MG TOTAL) BY MOUTH AT BEDTIME.  . ferrous sulfate 325 (65 FE) MG tablet Take 325 mg by mouth every morning.  . gabapentin (NEURONTIN) 300 MG capsule TAKE 2 CAPSULES (600 MG TOTAL) BY MOUTH AT BEDTIME.  Marland Kitchen levothyroxine (SYNTHROID, LEVOTHROID) 88 MCG tablet TAKE ONE TABLET BY MOUTH ONCE DAILY BEFORE BREAKFAST  . lovastatin (MEVACOR) 40 MG tablet Take 1 tablet (40 mg total) by mouth daily.  . Multiple Vitamins-Minerals (PRESERVISION AREDS 2) CAPS Take 1 tablet by mouth 2 (two) times daily.   . mupirocin cream (BACTROBAN) 2 % Apply 1 application topically 2 (two) times daily.  Marland Kitchen nystatin cream (MYCOSTATIN) Apply 1 application topically 2 (two) times daily.  Marland Kitchen omeprazole (PRILOSEC) 20 MG capsule TAKE 1 CAPSULE (20 MG TOTAL) BY MOUTH DAILY.  Marland Kitchen propranolol (INDERAL) 10 MG tablet TAKE 1 TABLET (10 MG TOTAL) BY MOUTH DAILY AS NEEDED.  Marland Kitchen vitamin B-12 (CYANOCOBALAMIN) 1000 MCG tablet Take 1,000 mcg by mouth daily.  Marland Kitchen warfarin (COUMADIN) 3 MG tablet TAKE 1 TABLET (3 MG TOTAL) BY MOUTH DAILY.  . [DISCONTINUED] albuterol (PROAIR HFA) 108 (90 Base) MCG/ACT inhaler Inhale 2 puffs into the lungs every 6 (six) hours as needed for wheezing or shortness of breath.  . triamcinolone cream (KENALOG) 0.1 % Apply 1 application topically 2 (two) times daily.  . [DISCONTINUED] traMADol (ULTRAM) 50 MG tablet Take 1 tablet (50 mg total) by mouth every 6 (six) hours as needed. (Patient not taking: Reported on 08/10/2018)   No facility-administered encounter medications on file as of 08/10/2018.     Review of Systems  Constitutional: Negative for appetite change and unexpected weight change.  HENT: Negative for congestion and sinus pressure.   Respiratory: Negative for cough, chest tightness and shortness of breath.   Cardiovascular: Positive for leg swelling. Negative for chest pain and palpitations.  Gastrointestinal:  Negative for abdominal pain, diarrhea, nausea and vomiting.  Genitourinary: Negative for difficulty urinating and dysuria.  Musculoskeletal: Negative for joint swelling and myalgias.  Skin: Negative for color change and rash.  Neurological: Negative for dizziness and headaches.  Psychiatric/Behavioral: Negative for agitation and dysphoric mood.  Objective:    Physical Exam  Constitutional: She appears well-developed and well-nourished. No distress.  HENT:  Nose: Nose normal.  Mouth/Throat: Oropharynx is clear and moist.  Neck: Neck supple. No thyromegaly present.  Cardiovascular: Normal rate and regular rhythm.  Pulmonary/Chest: Breath sounds normal. No respiratory distress. She has no wheezes.  Abdominal: Soft. Bowel sounds are normal. There is no tenderness.  Musculoskeletal: She exhibits no tenderness.  Some pedal and lower extremity swelling - minimal today.  DP pulses palpable and equal bilaterally.    Lymphadenopathy:    She has no cervical adenopathy.  Skin: No rash noted. No erythema.  Psychiatric: She has a normal mood and affect. Her behavior is normal.    BP 118/78 (BP Location: Left Arm, Patient Position: Sitting, Cuff Size: Normal)   Pulse 65   Temp 98.4 F (36.9 C) (Oral)   Resp 15   Ht '5\' 5"'$  (1.651 m)   Wt 180 lb 4 oz (81.8 kg)   SpO2 98%   BMI 30.00 kg/m  Wt Readings from Last 3 Encounters:  08/10/18 180 lb 4 oz (81.8 kg)  07/19/18 176 lb 12.8 oz (80.2 kg)  05/29/18 179 lb 9.6 oz (81.5 kg)     Lab Results  Component Value Date   WBC 6.1 05/22/2018   HGB 12.3 05/22/2018   HCT 36.2 05/22/2018   PLT 216 05/22/2018   GLUCOSE 114 (H) 07/19/2018   CHOL 168 07/19/2018   TRIG 166.0 (H) 07/19/2018   HDL 41.70 07/19/2018   LDLDIRECT 75.0 03/24/2017   LDLCALC 93 07/19/2018   ALT 12 07/19/2018   AST 14 07/19/2018   NA 141 07/19/2018   K 4.0 07/19/2018   CL 103 07/19/2018   CREATININE 0.86 07/19/2018   BUN 14 07/19/2018   CO2 29 07/19/2018    TSH 2.68 07/19/2018   INR 2.4 (H) 08/07/2018   HGBA1C 6.1 07/19/2018    US Venous Img Lower Unilateral Right  Result Date: 05/29/2018 CLINICAL DATA:  84 year old with right lower extremity swelling for 1 week. EXAM: RIGHT LOWER EXTREMITY VENOUS DOPPLER ULTRASOUND TECHNIQUE: Gray-scale sonography with graded compression, as well as color Doppler and duplex ultrasound were performed to evaluate the lower extremity deep venous systems from the level of the common femoral vein and including the common femoral, femoral, profunda femoral, popliteal and calf veins including the posterior tibial, peroneal and gastrocnemius veins when visible. The superficial great saphenous vein was also interrogated. Spectral Doppler was utilized to evaluate flow at rest and with distal augmentation maneuvers in the common femoral, femoral and popliteal veins. COMPARISON:  None. FINDINGS: Contralateral Common Femoral Vein: Respiratory phasicity is normal and symmetric with the symptomatic side. No evidence of thrombus. Normal compressibility. Common Femoral Vein: No evidence of thrombus. Normal compressibility, respiratory phasicity and response to augmentation. Saphenofemoral Junction: No evidence of thrombus. Normal compressibility and flow on color Doppler imaging. Profunda Femoral Vein: No evidence of thrombus. Normal compressibility and flow on color Doppler imaging. Femoral Vein: No evidence of thrombus. Normal compressibility, respiratory phasicity and response to augmentation. Popliteal Vein: No evidence of thrombus. Normal compressibility, respiratory phasicity and response to augmentation. Calf Veins: No evidence of thrombus. Normal compressibility and flow on color Doppler imaging. Other Findings: Area of concern is anterior to the patella. There is a heterogeneous hypoechoic structure anterior to the patella that measures 3.1 x 1.8 x 4.2 cm. There is no vascular flow within this structure. IMPRESSION: Negative for deep  venous thrombosis in right lower extremity. Complex  hypoechoic structure anterior to the right patella. Findings are suggestive for a complex fluid collection such as a hematoma. Electronically Signed   By: Markus Daft M.D.   On: 05/29/2018 16:49       Assessment & Plan:   Problem List Items Addressed This Visit    Anemia    Follow cbc.       Atrial fibrillation ([place])    Rated controlled.  On coumadin.  Doing well.        Bilateral knee pain    Has seen rheumatology.  S/p injection.  Persistent pain.  Request referral back to ortho.        Relevant Orders   Ambulatory referral to Orthopedic Surgery   Essential hypertension    Blood pressure under good control.  Continue same medication regimen.  Follow pressures.  Follow metabolic panel.        Hypercholesterolemia    On lovastatin.  Low cholesterol diet and exercise.  Follow lipid panel and liver function tests.        Hyperglycemia    Low carb diet and exercise.  Follow met b and a1c.        Swelling of lower extremity    Persistent swelling of lower extremities.  Wears compression hose.  Feel this is contributing to her itching - stasis dermatitis.  Will give her triamcinolone cream as directed.  Elevate legs.  Continue compression hose.  Refer back to vascular surgery.        Relevant Orders   Ambulatory referral to Vascular Surgery       Einar Pheasant, MD

## 2018-08-12 ENCOUNTER — Encounter: Payer: Self-pay | Admitting: Internal Medicine

## 2018-08-12 NOTE — Assessment & Plan Note (Signed)
Rated controlled.  On coumadin.  Doing well.

## 2018-08-12 NOTE — Assessment & Plan Note (Signed)
Follow cbc.  

## 2018-08-13 DIAGNOSIS — M7989 Other specified soft tissue disorders: Secondary | ICD-10-CM | POA: Insufficient documentation

## 2018-08-13 NOTE — Assessment & Plan Note (Signed)
On lovastatin.  Low cholesterol diet and exercise.  Follow lipid panel and liver function tests.   

## 2018-08-13 NOTE — Assessment & Plan Note (Signed)
Has seen rheumatology.  S/p injection.  Persistent pain.  Request referral back to ortho.

## 2018-08-13 NOTE — Assessment & Plan Note (Signed)
Low carb diet and exercise.  Follow met b and a1c.   

## 2018-08-13 NOTE — Assessment & Plan Note (Signed)
Blood pressure under good control.  Continue same medication regimen.  Follow pressures.  Follow metabolic panel.   

## 2018-08-13 NOTE — Assessment & Plan Note (Signed)
Persistent swelling of lower extremities.  Wears compression hose.  Feel this is contributing to her itching - stasis dermatitis.  Will give her triamcinolone cream as directed.  Elevate legs.  Continue compression hose.  Refer back to vascular surgery.

## 2018-08-16 DIAGNOSIS — H353221 Exudative age-related macular degeneration, left eye, with active choroidal neovascularization: Secondary | ICD-10-CM | POA: Diagnosis not present

## 2018-08-20 ENCOUNTER — Ambulatory Visit (INDEPENDENT_AMBULATORY_CARE_PROVIDER_SITE_OTHER): Payer: Medicare Other | Admitting: Nurse Practitioner

## 2018-08-20 ENCOUNTER — Encounter (INDEPENDENT_AMBULATORY_CARE_PROVIDER_SITE_OTHER): Payer: Self-pay | Admitting: Nurse Practitioner

## 2018-08-20 VITALS — BP 139/86 | HR 57 | Resp 16 | Ht 65.0 in | Wt 179.0 lb

## 2018-08-20 DIAGNOSIS — R600 Localized edema: Secondary | ICD-10-CM

## 2018-08-20 DIAGNOSIS — K219 Gastro-esophageal reflux disease without esophagitis: Secondary | ICD-10-CM | POA: Diagnosis not present

## 2018-08-20 DIAGNOSIS — I10 Essential (primary) hypertension: Secondary | ICD-10-CM

## 2018-08-20 DIAGNOSIS — M7989 Other specified soft tissue disorders: Secondary | ICD-10-CM

## 2018-08-20 DIAGNOSIS — M170 Bilateral primary osteoarthritis of knee: Secondary | ICD-10-CM

## 2018-08-20 NOTE — Progress Notes (Signed)
Subjective:    Patient ID: Kerri Mills, female    DOB: 27-Mar-1931, 85 y.o.   MRN: [id number] Chief Complaint  Patient presents with  . New Patient (Initial Visit)    ref Nicki Reaper for bilateral leg swelling    HPI  Kerri Mills is a 84 y.o. female that is referred by Dr. Nicki Reaper for bilateral lower extremity leg swelling. The patient first noticed the swelling remotely but is now concerned because of a significant increase in the overall edema. The swelling is associated with pain and discoloration. The patient also has extreme pruritus of the bilateral lower extremities The patient notes that in the morning the legs are significantly improved but they steadily worsened throughout the course of the day. Elevation makes the legs better, dependency makes them much worse.   She has had ulcerations, approximately 7 years ago, associated with the swelling.   The patient denies any recent changes in their medications.  The patient has been wearing graduated compression, for approximately 7 years. Her most recent pair was purchased this year  She reports having had an endovenous laser ablation on her left lower extremity as well as sclerotherapy following traumatic varicose vein hemorrhage.  The patient endorses a history of DVT following thyroid surgery in 1992.  There is no prior history of phlebitis. There is no history of primary lymphedema.  There is no history of radiation treatment to the groin or pelvis Patient has underwent radiation for breast cancer No history of trauma or groin or pelvic surgery. No history of foreign travel or parasitic infections area    Past Medical History:  Diagnosis Date  . Allergy   . Anxiety   . Arthritis   . Breast CA ([place])   . Breast cancer ([place]) 2011   LT LUMPECTOMY  . Clotting disorder ([place])   . Colitis   . Depression   . Diverticulitis 2013  . Gastric ulcer   . GERD (gastroesophageal reflux disease)   . Heart disease   .  Hypercholesterolemia   . Hypertension   . Infiltrating lobular carcinoma of left breast 2011   T2,N0, ER: 90%; PR 0%; Her 2 neu not amplified. [facility]).  . Melanoma ([place]) 1997  . Melanoma in situ of upper extremity ([place]) 03/19/2011  . Personal history of radiation therapy 2011   BREAST CA  . Seroma    HISTORY OF LFT BREAST  . Sleep apnea   . Thyroid cancer ([place]) 1992    Past Surgical History:  Procedure Laterality Date  . ABDOMINAL HYSTERECTOMY  1973   partial  . BREAST BIOPSY Left 02-13-13   BENIGN BREAST TISSUE WITH CHANGES CONSISTENT WITH FAT NECROSIS  . BREAST BIOPSY Left 01/21/2015   bx done in [facility] 11:00 left 6-8cmfn  . BREAST EXCISIONAL BIOPSY Left 1995   neg  . BREAST EXCISIONAL BIOPSY Left 2011   Breast cancer radiation  . BREAST LUMPECTOMY Left 2011   BREAST CA  . CARDIAC CATHETERIZATION    . CHOLECYSTECTOMY    . COLONOSCOPY  2013  . MELANOMA EXCISION     RT UPPER ARM  . PARTIAL HYSTERECTOMY     bleeding, ovaries in place.    . THYROID SURGERY  1992   FOR THYROID CANCER  . TONSILLECTOMY      Social History   Socioeconomic History  . Marital status: Widowed    Spouse name: Not on file  . Number of children: Not on file  . Years  of education: Not on file  . Highest education level: Not on file  Occupational History  . Not on file  Social Needs  . Financial resource strain: Not hard at all  . Food insecurity:    Worry: Never true    Inability: Never true  . Transportation needs:    Medical: No    Non-medical: No  Tobacco Use  . Smoking status: Never Smoker  . Smokeless tobacco: Never Used  Substance and Sexual Activity  . Alcohol use: Yes    Alcohol/week: 0.0 standard drinks    Comment: social drinking. average times a week  . Drug use: No  . Sexual activity: Never  Lifestyle  . Physical activity:    Days per week: 7 days    Minutes per session: 40 min  . Stress: Not at all  Relationships  . Social connections:     Talks on phone: Not on file    Gets together: Not on file    Attends religious service: Not on file    Active member of club or organization: Not on file    Attends meetings of clubs or organizations: Not on file    Relationship status: Widowed  . Intimate partner violence:    Fear of current or ex partner: Not on file    Emotionally abused: Not on file    Physically abused: Not on file    Forced sexual activity: Not on file  Other Topics Concern  . Not on file  Social History Narrative   Independent and baseline. Lives by herself    Family History  Problem Relation Age of Onset  . Heart disease Mother   . Cancer Brother        lung   . Cancer Sister        breast  . Breast cancer Neg Hx     Allergies  Allergen Reactions  . Atorvastatin     Other reaction(s): Other (See Comments) STIFFNESS AND SORE STIFFNESS AND SORE  . Lipitor [Atorvastatin Calcium] Other (See Comments)    Stiffness & soreness  . Penicillins Rash    REACTION: Unknown reaction     Review of Systems   Review of Systems: Negative Unless Checked Constitutional: [] Weight loss  [] Fever  [] Chills Cardiac: [] Chest pain   [x]  Atrial Fibrillation  [] Palpitations   [] Shortness of breath when laying flat   [] Shortness of breath with exertion. Vascular:  [] Pain in legs with walking   [] Pain in legs with standing  [x] History of DVT   [] Phlebitis   [x] Swelling in legs   [x] Varicose veins   [] Non-healing ulcers Pulmonary:   [] Uses home oxygen   [] Productive cough   [] Hemoptysis   [] Wheeze  [] COPD   [] Asthma Neurologic:  [] Dizziness   [] Seizures   [] History of stroke   [] History of TIA  [] Aphasia   [] Vissual changes   [] Weakness or numbness in arm   [] Weakness or numbness in leg Musculoskeletal:   [] Joint swelling   [] Joint pain   [] Low back pain  []  History of Knee Replacement Hematologic:  [] Easy bruising  [] Easy bleeding   [] Hypercoagulable state   [] Anemic Gastrointestinal:  [] Diarrhea   [] Vomiting   [] Gastroesophageal reflux/heartburn   [] Difficulty swallowing. Genitourinary:  [] Chronic kidney disease   [] Difficult urination  [] Anuric   [] Blood in urine Skin:  [x] Rashes   [] Ulcers  Psychological:  [] History of anxiety   [x]  History of major depression  []  Memory Difficulties     Objective:   Physical  Exam  BP 139/86 (BP Location: Right Arm)   Pulse (!) 57   Resp 16   Ht 5\' 5"  (1.651 m)   Wt 179 lb (81.2 kg)   BMI 29.79 kg/m   Gen: WD/WN, NAD Head: [place]/AT, No temporalis wasting.  Ear/Nose/Throat: Hearing grossly intact, nares w/o erythema or drainage Eyes: PER, EOMI, sclera nonicteric.  Neck: Supple, no masses.  No JVD.  Pulmonary:  Good air movement, no use of accessory muscles.  Cardiac: RRR Vascular:  2+ pitting edema bilaterally, bilateral stasis dermatitis, scattered spider varicosities bilaterally Vessel Right Left  Radial Palpable Palpable  Dorsalis Pedis Palpable Palpable  Posterior Tibial Palpable Palpable   Gastrointestinal: soft, non-distended. No guarding/no peritoneal signs.  Musculoskeletal: M/S 5/5 throughout.  No deformity or atrophy.  Neurologic: Pain and light touch intact in extremities.  Symmetrical.  Speech is fluent. Motor exam as listed above. Psychiatric: Judgment intact, Mood & affect appropriate for pt's clinical situation. Dermatologic:  Bilateral stasis dermatitis.  No Ulcers Noted.  No changes consistent with cellulitis. Lymph : No Cervical lymphadenopathy.  Dermal thickening present bilaterally     Assessment & Plan:   1. Swelling of lower extremity I have had a long discussion with the patient regarding swelling and why it  causes symptoms.  Patient will continue wearing graduated compression stockings class 1 (20-30 mmHg) on a daily basis a prescription was given. The patient will  beginning wearing the stockings first thing in the morning and removing them in the evening. The patient is instructed specifically not to sleep in the stockings.    In addition, behavioral modification will be initiated.  This will include frequent elevation, use of over the counter pain medications and exercise such as walking.  I have reviewed systemic causes for chronic edema such as liver, kidney and cardiac etiologies.  The patient denies problems with these organ systems.    Consideration for a lymph pump will also be made based upon the effectiveness of conservative therapy.  This would help to improve the edema control and prevent sequela such as ulcers and infections   Patient should undergo duplex ultrasound of the venous system to ensure that DVT or reflux is not present.  Patient also advised to utilize over-the-counter Claritin for pruritus daily.   The patient will follow-up with me after the ultrasound.   - VAS [place] LOWER EXTREMITY VENOUS REFLUX; Future  2. Osteoarthritis of both knees, unspecified osteoarthritis type Continue NSAID medications as already ordered, these medications have been reviewed and there are no changes at this time.  Continued activity and therapy was stressed.    3. Gastroesophageal reflux disease without esophagitis Continue PPI as already ordered, this medication has been reviewed and there are no changes at this time.  Avoidence of caffeine and alcohol  Moderate elevation of the head of the bed   4. Essential hypertension Continue antihypertensive medications as already ordered, these medications have been reviewed and there are no changes at this time.     Current Outpatient Medications on File Prior to Visit  Medication Sig Dispense Refill  . albuterol (PROAIR HFA) 108 (90 Base) MCG/ACT inhaler Inhale 2 puffs into the lungs every 6 (six) hours as needed for wheezing or shortness of breath. 1 Inhaler 3  . Cholecalciferol (VITAMIN D) 1000 UNITS capsule Take 1,000 Units by mouth daily.      Marland Kitchen diltiazem (CARDIZEM CD) 120 MG 24 hr capsule Take 1 capsule (120 mg total) by mouth daily. 90 capsule 3  .  DULoxetine (CYMBALTA) 60 MG capsule TAKE 1 CAPSULE (60 MG TOTAL) BY MOUTH AT BEDTIME. 90 capsule 0  . ferrous sulfate 325 (65 FE) MG tablet Take 325 mg by mouth every morning.    . gabapentin (NEURONTIN) 300 MG capsule TAKE 2 CAPSULES (600 MG TOTAL) BY MOUTH AT BEDTIME. 180 capsule 0  . levothyroxine (SYNTHROID, LEVOTHROID) 88 MCG tablet TAKE ONE TABLET BY MOUTH ONCE DAILY BEFORE BREAKFAST 90 tablet 2  . lovastatin (MEVACOR) 40 MG tablet Take 1 tablet (40 mg total) by mouth daily. 90 tablet 1  . Multiple Vitamins-Minerals (PRESERVISION AREDS 2) CAPS Take 1 tablet by mouth 2 (two) times daily.     . mupirocin cream (BACTROBAN) 2 % Apply 1 application topically 2 (two) times daily. 15 g 0  . nystatin cream (MYCOSTATIN) Apply 1 application topically 2 (two) times daily. 60 g 0  . omeprazole (PRILOSEC) 20 MG capsule TAKE 1 CAPSULE (20 MG TOTAL) BY MOUTH DAILY. 90 capsule 3  . propranolol (INDERAL) 10 MG tablet TAKE 1 TABLET (10 MG TOTAL) BY MOUTH DAILY AS NEEDED. 90 tablet 0  . triamcinolone cream (KENALOG) 0.1 % Apply 1 application topically 2 (two) times daily. 30 g 0  . vitamin B-12 (CYANOCOBALAMIN) 1000 MCG tablet Take 1,000 mcg by mouth daily.    Marland Kitchen warfarin (COUMADIN) 3 MG tablet TAKE 1 TABLET (3 MG TOTAL) BY MOUTH DAILY. 90 tablet 1   No current facility-administered medications on file prior to visit.     There are no Patient Instructions on file for this visit. No follow-ups on file.   Kris Hartmann, NP  This note was completed with Sales executive.  Any errors are purely unintentional.

## 2018-08-29 ENCOUNTER — Ambulatory Visit (INDEPENDENT_AMBULATORY_CARE_PROVIDER_SITE_OTHER): Payer: Medicare Other | Admitting: Family

## 2018-08-29 ENCOUNTER — Encounter: Payer: Self-pay | Admitting: Family

## 2018-08-29 ENCOUNTER — Ambulatory Visit: Payer: Medicare Other

## 2018-08-29 VITALS — BP 108/62 | HR 63 | Temp 98.6°F | Wt 177.0 lb

## 2018-08-29 DIAGNOSIS — R05 Cough: Secondary | ICD-10-CM

## 2018-08-29 DIAGNOSIS — R059 Cough, unspecified: Secondary | ICD-10-CM

## 2018-08-29 MED ORDER — BENZONATATE 100 MG PO CAPS: 100.0000 mg | ORAL_CAPSULE | Freq: Two times a day (BID) | ORAL | 0 refills | Status: DC | PRN
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
## 2018-08-29 NOTE — Assessment & Plan Note (Addendum)
Suspect viral based on duration of symptoms, and improvement thereof.  Advised patient to continue close vigilance and she needs Tessalon, albuterol as needed.  Pending chest x-ray.  Walking SaO2 > 94% .  Of note: f/u with cardiologist over due; we made patient an appt for 09/2018.

## 2018-08-29 NOTE — Patient Instructions (Addendum)
Suspect possible viral cause from wet and cold weather this week.   You may use tessalon for cough as needed  Use albuterol every 6 hours for first 24 hours to get good medication into the lungs and loosen congestion; after, you may use as needed and eventually stop all together when cough resolves.   Appointment as scheduled with Dr Rockey Situ  Let me know if not better.

## 2018-08-31 ENCOUNTER — Other Ambulatory Visit: Payer: Self-pay

## 2018-08-31 ENCOUNTER — Ambulatory Visit: Payer: Self-pay

## 2018-08-31 MED ORDER — DOXYCYCLINE HYCLATE 100 MG PO TABS: 100.0000 mg | ORAL_TABLET | Freq: Two times a day (BID) | ORAL | 0 refills | Status: DC

## 2018-08-31 NOTE — Telephone Encounter (Signed)
Pt. Seen 08/29/18. Tessalon not helping cough "at all." Coughing up dark green phlegm. Also has bloody, green discharge from her nose. Has occasional wheezing. Voice is hoarse. Has chills at times. Not sleeping well at night due to cough. Request something else for cough be sent to her [facility] on [address]. Please advise pt.  Answer Assessment - Initial Assessment Questions 1. ONSET: "When did the cough begin?"      Started last week 2. SEVERITY: "How bad is the cough today?"      Severe 3. RESPIRATORY DISTRESS: "Describe your breathing."      Shortness of breath 4. FEVER: "Do you have a fever?" If so, ask: "What is your temperature, how was it measured, and when did it start?"     Gets chills 5. SPUTUM: "Describe the color of your sputum" (clear, white, yellow, green)     Dark green 6. HEMOPTYSIS: "Are you coughing up any blood?" If so ask: "How much?" (flecks, streaks, tablespoons, etc.)     From her nose 7. CARDIAC HISTORY: "Do you have any history of heart disease?" (e.g., heart attack, congestive heart failure)      A-fib 8. LUNG HISTORY: "Do you have any history of lung disease?"  (e.g., pulmonary embolus, asthma, emphysema)     No 9. PE RISK FACTORS: "Do you have a history of blood clots?" (or: recent major surgery, recent prolonged travel, bedridden)     DVT 10. OTHER SYMPTOMS: "Do you have any other symptoms?" (e.g., runny nose, wheezing, chest pain)       Runny nose, wheezing 11. PREGNANCY: "Is there any chance you are pregnant?" "When was your last menstrual period?"       No 12. TRAVEL: "Have you traveled out of the country in the last month?" (e.g., travel history, exposures)       No  Protocols used: [place]

## 2018-08-31 NOTE — Telephone Encounter (Signed)
Spoke with patient. Was seen on 12/4. Her cough has worsened since visit with you. Cough is productive with dark green phlegm. Has not checked temp but is having chills. Has not tried any OTC cough meds. Recommended Robitussin. Using albuterol BID. Pt is on coumadin. Last INR was drawn on 11/12. CXR was ordered but not done at visit. I have sent in Doxycycline 100 mg BID x 7 days and instructed pt to call with update on Monday. If not better, needs to be seen. Also made her aware that she needs to have INR checked while on ABX and have CXR done. Pt agreed. This has been routed to [facility] and Dr. Nicki Reaper as an Juluis Rainier

## 2018-08-31 NOTE — Telephone Encounter (Signed)
Please advise 

## 2018-08-31 NOTE — Telephone Encounter (Signed)
Thank you for your note, agree with your plan, advice    When you speak to the patient on Monday please advise that she comes in midway through her 7-day course of doxycycline for INR to be checked.   Monday would work well.    She do this and also have chest x-ray.  fyi scott

## 2018-08-31 NOTE — Telephone Encounter (Signed)
Reviewed. Note.  Per Joycelyn Schmid, antibiotic ordered.  Agree with recheck coumadin since on abx.  If any change or worsening symptoms, needs to be evaluated.

## 2018-08-31 NOTE — Telephone Encounter (Signed)
Duplicate.  See note.

## 2018-09-03 ENCOUNTER — Ambulatory Visit (INDEPENDENT_AMBULATORY_CARE_PROVIDER_SITE_OTHER): Payer: Medicare Other

## 2018-09-03 ENCOUNTER — Ambulatory Visit (INDEPENDENT_AMBULATORY_CARE_PROVIDER_SITE_OTHER): Payer: Medicare Other | Admitting: Family Medicine

## 2018-09-03 ENCOUNTER — Encounter: Payer: Self-pay | Admitting: Family Medicine

## 2018-09-03 VITALS — BP 136/76 | HR 77 | Temp 98.2°F | Ht 65.0 in | Wt 172.4 lb

## 2018-09-03 DIAGNOSIS — J989 Respiratory disorder, unspecified: Secondary | ICD-10-CM | POA: Diagnosis not present

## 2018-09-03 DIAGNOSIS — R059 Cough, unspecified: Secondary | ICD-10-CM

## 2018-09-03 DIAGNOSIS — I4891 Unspecified atrial fibrillation: Secondary | ICD-10-CM

## 2018-09-03 DIAGNOSIS — R062 Wheezing: Secondary | ICD-10-CM | POA: Diagnosis not present

## 2018-09-03 DIAGNOSIS — R0989 Other specified symptoms and signs involving the circulatory and respiratory systems: Secondary | ICD-10-CM | POA: Diagnosis not present

## 2018-09-03 DIAGNOSIS — R05 Cough: Secondary | ICD-10-CM

## 2018-09-03 DIAGNOSIS — Z7901 Long term (current) use of anticoagulants: Secondary | ICD-10-CM

## 2018-09-03 LAB — PROTIME-INR
INR: 2.7 ratio — ABNORMAL HIGH (ref 0.8–1.0)
PROTHROMBIN TIME: 31.4 s — AB (ref 9.6–13.1)

## 2018-09-03 MED ORDER — ALBUTEROL SULFATE (2.5 MG/3ML) 0.083% IN NEBU
2.5000 mg | INHALATION_SOLUTION | Freq: Once | RESPIRATORY_TRACT | Status: AC
  Administered 2018-09-03: 2.5 mg via RESPIRATORY_TRACT

## 2018-09-03 MED ORDER — IPRATROPIUM BROMIDE 0.02 % IN SOLN
0.5000 mg | Freq: Once | RESPIRATORY_TRACT | Status: AC
  Administered 2018-09-03: 0.5 mg via RESPIRATORY_TRACT

## 2018-09-03 MED ORDER — GUAIFENESIN-CODEINE 100-10 MG/5ML PO SOLN: 5.0000 mL | ORAL | 0 refills | Status: DC | PRN

## 2018-09-03 MED ORDER — PREDNISONE 10 MG (21) PO TBPK: ORAL_TABLET | ORAL | 0 refills | Status: DC
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
## 2018-09-03 NOTE — Patient Instructions (Signed)
Finish doxycycline course  Take steroid taper  Use albuterol inhaler at home at least 2 times per day (in Am and Pm) and can use in-between as needed - this will help open up lungs  Robitussin with codeine syrup sent in to help improve cough at night and allow you to get some sleep

## 2018-09-03 NOTE — Telephone Encounter (Signed)
Pt was seen today by Ander Purpura. INR was checked at visit

## 2018-09-07 ENCOUNTER — Other Ambulatory Visit: Payer: Self-pay | Admitting: Radiology

## 2018-09-07 ENCOUNTER — Other Ambulatory Visit (INDEPENDENT_AMBULATORY_CARE_PROVIDER_SITE_OTHER): Payer: Medicare Other

## 2018-09-07 DIAGNOSIS — Z5181 Encounter for therapeutic drug level monitoring: Secondary | ICD-10-CM

## 2018-09-07 DIAGNOSIS — Z7901 Long term (current) use of anticoagulants: Principal | ICD-10-CM

## 2018-09-07 LAB — PROTIME-INR
INR: 3.8 ratio — ABNORMAL HIGH (ref 0.8–1.0)
Prothrombin Time: 43.2 s — ABNORMAL HIGH (ref 9.6–13.1)

## 2018-09-07 NOTE — Progress Notes (Unsigned)
in

## 2018-09-10 ENCOUNTER — Other Ambulatory Visit: Payer: Self-pay | Admitting: Internal Medicine

## 2018-09-11 ENCOUNTER — Other Ambulatory Visit (INDEPENDENT_AMBULATORY_CARE_PROVIDER_SITE_OTHER): Payer: Medicare Other

## 2018-09-11 ENCOUNTER — Ambulatory Visit: Payer: BC Managed Care – PPO | Admitting: Podiatry

## 2018-09-11 DIAGNOSIS — Z7901 Long term (current) use of anticoagulants: Secondary | ICD-10-CM | POA: Diagnosis not present

## 2018-09-11 LAB — PROTIME-INR
INR: 3.7 ratio — ABNORMAL HIGH (ref 0.8–1.0)
Prothrombin Time: 41.7 s — ABNORMAL HIGH (ref 9.6–13.1)

## 2018-09-13 MED ORDER — ALBUTEROL SULFATE (2.5 MG/3ML) 0.083% IN NEBU: 2.5000 mg | INHALATION_SOLUTION | Freq: Once | RESPIRATORY_TRACT | Status: DC

## 2018-09-13 NOTE — Addendum Note (Signed)
Addended by: Neta Ehlers on: 09/13/2018 01:31 PM   Modules accepted: Orders

## 2018-09-14 ENCOUNTER — Telehealth: Payer: Self-pay | Admitting: Radiology

## 2018-09-14 DIAGNOSIS — Z7901 Long term (current) use of anticoagulants: Secondary | ICD-10-CM

## 2018-09-14 NOTE — Telephone Encounter (Signed)
Pt is coming in for labs tomorrow, please place future orders. Thank you

## 2018-09-14 NOTE — Telephone Encounter (Signed)
Order placed for pt/inr 

## 2018-09-17 ENCOUNTER — Other Ambulatory Visit: Payer: Medicare Other

## 2018-09-17 ENCOUNTER — Other Ambulatory Visit (INDEPENDENT_AMBULATORY_CARE_PROVIDER_SITE_OTHER): Payer: Medicare Other

## 2018-09-17 DIAGNOSIS — Z7901 Long term (current) use of anticoagulants: Secondary | ICD-10-CM

## 2018-09-17 NOTE — Addendum Note (Signed)
Addended by: Leeanne Rio on: 09/17/2018 02:57 PM   Modules accepted: Orders

## 2018-09-18 LAB — PROTIME-INR
INR: 2.3 — ABNORMAL HIGH
Prothrombin Time: 22.5 s — ABNORMAL HIGH (ref 9.0–11.5)

## 2018-09-20 ENCOUNTER — Ambulatory Visit (INDEPENDENT_AMBULATORY_CARE_PROVIDER_SITE_OTHER): Payer: BC Managed Care – PPO | Admitting: Nurse Practitioner

## 2018-09-20 ENCOUNTER — Encounter (INDEPENDENT_AMBULATORY_CARE_PROVIDER_SITE_OTHER): Payer: BC Managed Care – PPO

## 2018-09-20 ENCOUNTER — Telehealth: Payer: Self-pay | Admitting: *Deleted

## 2018-09-20 DIAGNOSIS — Z7901 Long term (current) use of anticoagulants: Secondary | ICD-10-CM

## 2018-09-20 NOTE — Telephone Encounter (Signed)
Lab order placed.

## 2018-09-21 ENCOUNTER — Encounter (INDEPENDENT_AMBULATORY_CARE_PROVIDER_SITE_OTHER): Payer: Self-pay | Admitting: Nurse Practitioner

## 2018-09-21 ENCOUNTER — Ambulatory Visit (INDEPENDENT_AMBULATORY_CARE_PROVIDER_SITE_OTHER): Payer: BC Managed Care – PPO

## 2018-09-21 ENCOUNTER — Ambulatory Visit (INDEPENDENT_AMBULATORY_CARE_PROVIDER_SITE_OTHER): Payer: BC Managed Care – PPO | Admitting: Nurse Practitioner

## 2018-09-21 ENCOUNTER — Other Ambulatory Visit: Payer: Self-pay

## 2018-09-21 VITALS — BP 133/81 | HR 62 | Resp 16 | Ht 65.5 in | Wt 177.0 lb

## 2018-09-21 DIAGNOSIS — I10 Essential (primary) hypertension: Secondary | ICD-10-CM | POA: Diagnosis not present

## 2018-09-21 DIAGNOSIS — R600 Localized edema: Secondary | ICD-10-CM | POA: Diagnosis not present

## 2018-09-21 DIAGNOSIS — I8311 Varicose veins of right lower extremity with inflammation: Secondary | ICD-10-CM | POA: Diagnosis not present

## 2018-09-21 DIAGNOSIS — I8312 Varicose veins of left lower extremity with inflammation: Secondary | ICD-10-CM | POA: Diagnosis not present

## 2018-09-21 DIAGNOSIS — K219 Gastro-esophageal reflux disease without esophagitis: Secondary | ICD-10-CM | POA: Diagnosis not present

## 2018-09-21 DIAGNOSIS — M7989 Other specified soft tissue disorders: Secondary | ICD-10-CM

## 2018-09-21 NOTE — Progress Notes (Signed)
Subjective:    Patient ID: Kerri Mills, female    DOB: 09/24/1931, 84 y.o.   MRN: [id number] No chief complaint on file. Chief complaint: Varicose veins  HPI  Kerri Mills is a 84 y.o. female that is following up in regards to bilateral lower extremity swelling as well as her varicose veins.  The patient has a history of endovenous ablation on her left leg lower extremity approximately 7 or so years ago.  She also underwent sclerotherapy following a traumatic varicose vein hemorrhage.  She has been wearing medical grade 1 compression therapy since this time.  The patient is currently wearing compression stockings today.  She states that she first noticed the swelling remotely but is concerned due to the significant increase in the overall edema.  The swelling is associated with pain, itchiness, and discoloration.  She states that the pruritus is extreme at times.  She states that over-the-counter Claritin is somewhat helpful, she was also given a small container of steroid cream from her primary care physician which also helps her itching.  She states that in regards to the edema elevation makes legs better, dependency makes them much worse.  The patient has a previous history of DVT, as well as she has had a previous history of venous ulceration.  There is no prior history of phlebitis.  The patient denies amaurosis fugax, TIA there is no history of CVA. The patient denies past history of DVT, PE or superficial thrombophlebitis. The patient denies claudication-like symptoms, no rest pain, no past history of open wounds or sores of the feet.    Patient underwent a bilateral venous reflux study today which revealed reflux in the right great saphenous vein from the level of the mid thigh to the proximal calf area.  There is also reflux seen in the left great saphenous vein, also from the mid thigh to proximal calf area, as well as in the saphenofemoral junction.  There is no evidence of DVT or  superficial venous thrombosis bilaterally. Past Medical History:  Diagnosis Date  . Allergy   . Anxiety   . Arthritis   . Breast CA ([place])   . Breast cancer ([place]) 2011   LT LUMPECTOMY  . Clotting disorder ([place])   . Colitis   . Depression   . Diverticulitis 2013  . Gastric ulcer   . GERD (gastroesophageal reflux disease)   . Heart disease   . Hypercholesterolemia   . Hypertension   . Infiltrating lobular carcinoma of left breast 2011   T2,N0, ER: 90%; PR 0%; Her 2 neu not amplified. [facility]).  . Melanoma ([place]) 1997  . Melanoma in situ of upper extremity ([place]) 03/19/2011  . Personal history of radiation therapy 2011   BREAST CA  . Seroma    HISTORY OF LFT BREAST  . Sleep apnea   . Thyroid cancer ([place]) 1992    Past Surgical History:  Procedure Laterality Date  . ABDOMINAL HYSTERECTOMY  1973   partial  . BREAST BIOPSY Left 02-13-13   BENIGN BREAST TISSUE WITH CHANGES CONSISTENT WITH FAT NECROSIS  . BREAST BIOPSY Left 01/21/2015   bx done in [facility] 11:00 left 6-8cmfn  . BREAST EXCISIONAL BIOPSY Left 1995   neg  . BREAST EXCISIONAL BIOPSY Left 2011   Breast cancer radiation  . BREAST LUMPECTOMY Left 2011   BREAST CA  . CARDIAC CATHETERIZATION    . CHOLECYSTECTOMY    . COLONOSCOPY  2013  . MELANOMA EXCISION  RT UPPER ARM  . PARTIAL HYSTERECTOMY     bleeding, ovaries in place.    . THYROID SURGERY  1992   FOR THYROID CANCER  . TONSILLECTOMY      Social History   Socioeconomic History  . Marital status: Widowed    Spouse name: Not on file  . Number of children: Not on file  . Years of education: Not on file  . Highest education level: Not on file  Occupational History  . Not on file  Social Needs  . Financial resource strain: Not hard at all  . Food insecurity:    Worry: Never true    Inability: Never true  . Transportation needs:    Medical: No    Non-medical: No  Tobacco Use  . Smoking status: Never Smoker  . Smokeless tobacco:  Never Used  Substance and Sexual Activity  . Alcohol use: Yes    Alcohol/week: 0.0 standard drinks    Comment: social drinking. average times a week  . Drug use: No  . Sexual activity: Never  Lifestyle  . Physical activity:    Days per week: 7 days    Minutes per session: 40 min  . Stress: Not at all  Relationships  . Social connections:    Talks on phone: Not on file    Gets together: Not on file    Attends religious service: Not on file    Active member of club or organization: Not on file    Attends meetings of clubs or organizations: Not on file    Relationship status: Widowed  . Intimate partner violence:    Fear of current or ex partner: Not on file    Emotionally abused: Not on file    Physically abused: Not on file    Forced sexual activity: Not on file  Other Topics Concern  . Not on file  Social History Narrative   Independent and baseline. Lives by herself    Family History  Problem Relation Age of Onset  . Heart disease Mother   . Cancer Brother        lung   . Cancer Sister        breast  . Breast cancer Neg Hx     Allergies  Allergen Reactions  . Atorvastatin     Other reaction(s): Other (See Comments) STIFFNESS AND SORE STIFFNESS AND SORE  . Lipitor [Atorvastatin Calcium] Other (See Comments)    Stiffness & soreness  . Penicillins Rash    REACTION: Unknown reaction     Review of Systems   Review of Systems: Negative Unless Checked Constitutional: [] Weight loss  [] Fever  [] Chills Cardiac: [] Chest pain   [x]  Atrial Fibrillation  [] Palpitations   [] Shortness of breath when laying flat   [] Shortness of breath with exertion. [] Shortness of breath at rest Vascular:  [] Pain in legs with walking   [] Pain in legs with standing [] Pain in legs when laying flat   [] Claudication    [] Pain in feet when laying flat    [x] History of DVT   [] Phlebitis   [x] Swelling in legs   [x] Varicose veins   [] Non-healing ulcers Pulmonary:   [] Uses home oxygen    [] Productive cough   [] Hemoptysis   [] Wheeze  [] COPD   [] Asthma Neurologic:  [] Dizziness   [] Seizures  [] Blackouts [] History of stroke   [] History of TIA  [] Aphasia   [] Temporary Blindness   [] Weakness or numbness in arm   [] Weakness or numbness in leg Musculoskeletal:   []   Joint swelling   [] Joint pain   [] Low back pain  []  History of Knee Replacement [] Arthritis [] back Surgeries  []  Spinal Stenosis    Hematologic:  [] Easy bruising  [] Easy bleeding   [] Hypercoagulable state   [] Anemic Gastrointestinal:  [] Diarrhea   [] Vomiting  [] Gastroesophageal reflux/heartburn   [] Difficulty swallowing. [] Abdominal pain Genitourinary:  [] Chronic kidney disease   [] Difficult urination  [] Anuric   [] Blood in urine [] Frequent urination  [] Burning with urination   [] Hematuria Skin:  [x] Rashes   [] Ulcers [] Wounds Psychological:  [] History of anxiety   [x]  History of major depression  []  Memory Difficulties     Objective:   Physical Exam  BP 133/81 (BP Location: Left Arm, Patient Position: Sitting)   Pulse 62   Resp 16   Ht 5' 5.5" (1.664 m)   Wt 177 lb (80.3 kg)   BMI 29.01 kg/m   Gen: WD/WN, NAD Head: [place]/AT, No temporalis wasting.  Ear/Nose/Throat: Hearing grossly intact, nares w/o erythema or drainage Eyes: PER, EOMI, sclera nonicteric.  Neck: Supple, no masses.  No JVD.  Pulmonary:  Good air movement, no use of accessory muscles.  Cardiac: RRR Vascular: scattered varicosities present bilaterally.  Mild venous stasis changes to the legs bilaterally.  2+ soft pitting edema  Vessel Right Left  Radial Palpable Palpable  Dorsalis Pedis Palpable Palpable  Posterior Tibial Palpable Palpable   Gastrointestinal: soft, non-distended. No guarding/no peritoneal signs.  Musculoskeletal: M/S 5/5 throughout.  No deformity or atrophy.  Neurologic: Pain and light touch intact in extremities.  Symmetrical.  Speech is fluent. Motor exam as listed above. Psychiatric: Judgment intact, Mood & affect appropriate for  pt's clinical situation. Dermatologic:  Bilateral stasis dermatosis. No Ulcers Noted.  No changes consistent with cellulitis. Lymph : No Cervical lymphadenopathy, no lichenification or skin changes of chronic lymphedema.      Assessment & Plan:   1. Varicose veins of both lower extremities with inflammation Patient underwent a bilateral venous reflux study today which revealed reflux in the right great saphenous vein from the level of the mid thigh to the proximal calf area.  There is also reflux seen in the left great saphenous vein, also from the mid thigh to proximal calf area, as well as in the saphenofemoral junction.  There is no evidence of DVT or superficial venous thrombosis bilaterally.  Recommend  I have reviewed my previous  discussion with the patient regarding  varicose veins and why they cause symptoms. Patient will continue  wearing graduated compression stockings class 1 on a daily basis, beginning first thing in the morning and removing them in the evening.    In addition, behavioral modification including elevation during the day was again discussed and this will continue.  The patient has utilized over the counter pain medications and has been exercising.  However, at this time conservative therapy has not alleviated the patient's symptoms of leg pain and swelling  Recommend: laser ablation of the right and  left great saphenous veins to eliminate the symptoms of pain and swelling of the lower extremities caused by the severe superficial venous reflux disease.  2. Essential hypertension Continue antihypertensive medications as already ordered, these medications have been reviewed and there are no changes at this time.   3. Gastroesophageal reflux disease, esophagitis presence not specified Continue PPI as already ordered, this medication has been reviewed and there are no changes at this time.  Avoidence of caffeine and alcohol  Moderate elevation of the head of the  bed  Current Outpatient Medications on File Prior to Visit  Medication Sig Dispense Refill  . albuterol (PROAIR HFA) 108 (90 Base) MCG/ACT inhaler Inhale 2 puffs into the lungs every 6 (six) hours as needed for wheezing or shortness of breath. 1 Inhaler 3  . benzonatate (TESSALON) 100 MG capsule Take 1 capsule (100 mg total) by mouth 2 (two) times daily as needed for cough. 20 capsule 0  . Cholecalciferol (VITAMIN D) 1000 UNITS capsule Take 1,000 Units by mouth daily.      Marland Kitchen diltiazem (CARDIZEM CD) 120 MG 24 hr capsule Take 1 capsule (120 mg total) by mouth daily. 90 capsule 3  . DULoxetine (CYMBALTA) 60 MG capsule TAKE 1 CAPSULE (60 MG TOTAL) BY MOUTH AT BEDTIME. 90 capsule 0  . ferrous sulfate 325 (65 FE) MG tablet Take 325 mg by mouth every morning.    . gabapentin (NEURONTIN) 300 MG capsule TAKE 2 CAPSULES (600 MG TOTAL) BY MOUTH AT BEDTIME. 180 capsule 0  . guaiFENesin-codeine 100-10 MG/5ML syrup Take 5 mLs by mouth every 4 (four) hours as needed for cough. This medication can cause drowsiness. 120 mL 0  . levothyroxine (SYNTHROID, LEVOTHROID) 88 MCG tablet TAKE ONE TABLET BY MOUTH ONCE DAILY BEFORE BREAKFAST 90 tablet 2  . lovastatin (MEVACOR) 40 MG tablet Take 1 tablet (40 mg total) by mouth daily. 90 tablet 1  . Multiple Vitamins-Minerals (PRESERVISION AREDS 2) CAPS Take 1 tablet by mouth 2 (two) times daily.     . mupirocin cream (BACTROBAN) 2 % Apply 1 application topically 2 (two) times daily. 15 g 0  . nystatin cream (MYCOSTATIN) Apply 1 application topically 2 (two) times daily. 60 g 0  . omeprazole (PRILOSEC) 20 MG capsule TAKE 1 CAPSULE (20 MG TOTAL) BY MOUTH DAILY. 90 capsule 3  . propranolol (INDERAL) 10 MG tablet TAKE 1 TABLET (10 MG TOTAL) BY MOUTH DAILY AS NEEDED. 90 tablet 0  . triamcinolone cream (KENALOG) 0.1 % Apply 1 application topically 2 (two) times daily. 30 g 0  . vitamin B-12 (CYANOCOBALAMIN) 1000 MCG tablet Take 1,000 mcg by mouth daily.    Marland Kitchen warfarin  (COUMADIN) 3 MG tablet TAKE 1 TABLET (3 MG TOTAL) BY MOUTH DAILY. 90 tablet 1   Current Facility-Administered Medications on File Prior to Visit  Medication Dose Route Frequency Provider Last Rate Last Dose  . albuterol (PROVENTIL) (2.5 MG/3ML) 0.083% nebulizer solution 2.5 mg  2.5 mg Nebulization Once Guse, Jacquelynn Cree, FNP        There are no Patient Instructions on file for this visit. No follow-ups on file.   Kris Hartmann, NP  This note was completed with Sales executive.  Any errors are purely unintentional.

## 2018-09-27 ENCOUNTER — Other Ambulatory Visit: Payer: Medicare Other

## 2018-10-02 NOTE — Progress Notes (Signed)
Cardiology Office Note  Date:  10/03/2018   ID:  Kerri Mills, DOB 1930/10/06, MRN [id number]  PCP:  Einar Pheasant, MD   Chief Complaint  Patient presents with  . other    6 mo follow up. SOB with exertion inhalers helped. Medications reviewed verbally.     HPI:  Kerri Mills is a 85 y.o. female with  Remote DVT 1992 persistent atrial fibrillation, on anticoagulation hypertension,  hyperlipidemia,  LBBB,  OSA,   breast cancer s/p lumpectomy and XRT,   Hospital admission September 30, 2017 for chest pain Evaluated by cardiology for atrial fibrillation with RVR and chest pain  She presents today for follow-up after recent hospitalization, follow-up of her chest pain and A. Fib  Fells well, relatively active but no regular exercise program Lives at [place]  Dizzy climbing stairs when she gets to the top Has to hold onto something  Worsening lower extremity edema Legs itchy, lots of scratching feels it is from the leg swelling Seen by [facility] They offered laser ablation She is wearing compression hose for the past 7 years  Also reports having some shortness of breath weight down from 185 to 178  EKG personally reviewed by myself on todays visit Shows normal sinus rhythm with rate 57 bpm left axis deviation  Other past medical history reviewed Seen in the hospital 09/30/2017 hadn't been feeling well.   nauseous in the mornings and had chest pressure in the evenings.    very short of breath and tremulous.   called EMS and was found to be in atrial fibrillation with rates in the 120s.   admitted at [facility] where she was in atrial fibrillation at 120 bpm.   LBBB was noted on EKG.   INR the day prior to admission was 3.1.    She was started on a diltiazem infusion  orthopnea overnight.  Bradycardia on diltiazem and metoprolol    PMH:   has a past medical history of Allergy, Anxiety, Arthritis, Breast CA ([place]), Breast cancer ([place]) (2011), Clotting disorder  ([place]), Colitis, Depression, Diverticulitis (2013), Gastric ulcer, GERD (gastroesophageal reflux disease), Heart disease, Hypercholesterolemia, Hypertension, Infiltrating lobular carcinoma of left breast (2011), Melanoma ([place]) (1997), Melanoma in situ of upper extremity ([place] [address]) (03/19/2011), Personal history of radiation therapy (2011), Seroma, Sleep apnea, and Thyroid cancer ([place]) (1992).  PSH:    Past Surgical History:  Procedure Laterality Date  . ABDOMINAL HYSTERECTOMY  1973   partial  . BREAST BIOPSY Left 02-13-13   BENIGN BREAST TISSUE WITH CHANGES CONSISTENT WITH FAT NECROSIS  . BREAST BIOPSY Left 01/21/2015   bx done in [facility] 11:00 left 6-8cmfn  . BREAST EXCISIONAL BIOPSY Left 1995   neg  . BREAST EXCISIONAL BIOPSY Left 2011   Breast cancer radiation  . BREAST LUMPECTOMY Left 2011   BREAST CA  . CARDIAC CATHETERIZATION    . CHOLECYSTECTOMY    . COLONOSCOPY  2013  . MELANOMA EXCISION     RT UPPER ARM  . PARTIAL HYSTERECTOMY     bleeding, ovaries in place.    . THYROID SURGERY  1992   FOR THYROID CANCER  . TONSILLECTOMY      Current Outpatient Medications  Medication Sig Dispense Refill  . albuterol (PROAIR HFA) 108 (90 Base) MCG/ACT inhaler Inhale 2 puffs into the lungs every 6 (six) hours as needed for wheezing or shortness of breath. 1 Inhaler 3  . Cholecalciferol (VITAMIN D) 1000 UNITS capsule Take 1,000 Units by  mouth daily.      Marland Kitchen diltiazem (CARDIZEM CD) 120 MG 24 hr capsule Take 1 capsule (120 mg total) by mouth daily. 90 capsule 3  . DULoxetine (CYMBALTA) 60 MG capsule TAKE 1 CAPSULE (60 MG TOTAL) BY MOUTH AT BEDTIME. 90 capsule 0  . ferrous sulfate 325 (65 FE) MG tablet Take 325 mg by mouth every morning.    . gabapentin (NEURONTIN) 300 MG capsule TAKE 2 CAPSULES (600 MG TOTAL) BY MOUTH AT BEDTIME. 180 capsule 0  . levothyroxine (SYNTHROID, LEVOTHROID) 88 MCG tablet TAKE ONE TABLET BY MOUTH ONCE DAILY BEFORE BREAKFAST 90 tablet 2  . lovastatin (MEVACOR) 40 MG  tablet Take 1 tablet (40 mg total) by mouth daily. 90 tablet 1  . Multiple Vitamins-Minerals (PRESERVISION AREDS 2) CAPS Take 1 tablet by mouth 2 (two) times daily.     Marland Kitchen omeprazole (PRILOSEC) 20 MG capsule TAKE 1 CAPSULE (20 MG TOTAL) BY MOUTH DAILY. 90 capsule 3  . propranolol (INDERAL) 10 MG tablet TAKE 1 TABLET (10 MG TOTAL) BY MOUTH DAILY AS NEEDED. 90 tablet 0  . vitamin B-12 (CYANOCOBALAMIN) 1000 MCG tablet Take 1,000 mcg by mouth daily.    Marland Kitchen warfarin (COUMADIN) 3 MG tablet TAKE 1 TABLET (3 MG TOTAL) BY MOUTH DAILY. 90 tablet 1  . triamcinolone cream (KENALOG) 0.1 % Apply 1 application topically 2 (two) times daily. (Patient not taking: Reported on 10/03/2018) 30 g 0   Current Facility-Administered Medications  Medication Dose Route Frequency Provider Last Rate Last Dose  . albuterol (PROVENTIL) (2.5 MG/3ML) 0.083% nebulizer solution 2.5 mg  2.5 mg Nebulization Once Guse, Jacquelynn Cree, FNP         Allergies:   Atorvastatin; Lipitor [atorvastatin calcium]; and Penicillins   Social History:  The patient  reports that she has never smoked. She has never used smokeless tobacco. She reports current alcohol use. She reports that she does not use drugs.   Family History:   family history includes Cancer in her brother and sister; Heart disease in her mother.    Review of Systems: Review of Systems  Constitutional: Negative.   Respiratory: Positive for shortness of breath.   Cardiovascular: Positive for leg swelling.  Gastrointestinal: Negative.   Musculoskeletal: Negative.   Neurological: Negative.   Psychiatric/Behavioral: Negative.   All other systems reviewed and are negative.    PHYSICAL EXAM: VS:  BP 118/70 (BP Location: Left Arm, Patient Position: Sitting, Cuff Size: Normal)   Pulse (!) 57   Ht 5\' 5"  (1.651 m)   Wt 178 lb (80.7 kg)   BMI 29.62 kg/m  , BMI Body mass index is 29.62 kg/m. Constitutional:  oriented to person, place, and time. No distress.  HENT:  Head: Grossly  normal Eyes:  no discharge. No scleral icterus.  Neck: No JVD, no carotid bruits  Cardiovascular: Regular rate and rhythm, no murmurs appreciated Trace pitting lower extremity edema to the below the knees Pulmonary/Chest: Clear to auscultation bilaterally, no wheezes or rails Abdominal: Soft.  no distension.  no tenderness.  Musculoskeletal: Normal range of motion Neurological:  normal muscle tone. Coordination normal. No atrophy Skin: Skin warm and dry Psychiatric: normal affect, pleasant  Recent Labs: 05/22/2018: Hemoglobin 12.3; Platelets 216 07/19/2018: ALT 12; BUN 14; Creatinine, Ser 0.86; Potassium 4.0; Sodium 141; TSH 2.68    Lipid Panel Lab Results  Component Value Date   CHOL 168 07/19/2018   HDL 41.70 07/19/2018   LDLCALC 93 07/19/2018   TRIG 166.0 (H) 07/19/2018  Wt Readings from Last 3 Encounters:  10/03/18 178 lb (80.7 kg)  09/21/18 177 lb (80.3 kg)  09/03/18 172 lb 6.4 oz (78.2 kg)       ASSESSMENT AND PLAN:  Persistent atrial fibrillation ([place]) - Plan: EKG 12-Lead On warfarin, maintaining normal sinus rhythm, asymptomatic  Essential hypertension - Plan: EKG 12-Lead We will hold diltiazem given worsening lower extremity edema Start metoprolol succinate 50 mg daily for rate control/rhythm control  Pure hypercholesterolemia - Plan: EKG 12-Lead Continue lovastatin  Lower extremity edema Long discussion, Recommend she start Lasix 20 mg with potassium 22 times per week Also suggested she stop the diltiazem Start metoprolol succinate as above    Total encounter time more than 25 minutes  Greater than 50% was spent in counseling and coordination of care with the patient   Disposition:   F/U  6 months   Orders Placed This Encounter  Procedures  . EKG 12-Lead     Signed, Esmond Plants, M.D., Ph.D. 10/03/2018  [place]

## 2018-10-03 ENCOUNTER — Encounter: Payer: Self-pay | Admitting: Cardiovascular Disease

## 2018-10-03 ENCOUNTER — Ambulatory Visit (INDEPENDENT_AMBULATORY_CARE_PROVIDER_SITE_OTHER): Payer: Medicare Other | Admitting: Cardiovascular Disease

## 2018-10-03 VITALS — BP 118/70 | HR 57 | Ht 65.0 in | Wt 178.0 lb

## 2018-10-03 DIAGNOSIS — M7989 Other specified soft tissue disorders: Secondary | ICD-10-CM | POA: Diagnosis not present

## 2018-10-03 DIAGNOSIS — G4733 Obstructive sleep apnea (adult) (pediatric): Secondary | ICD-10-CM

## 2018-10-03 DIAGNOSIS — R079 Chest pain, unspecified: Secondary | ICD-10-CM

## 2018-10-03 DIAGNOSIS — I10 Essential (primary) hypertension: Secondary | ICD-10-CM | POA: Diagnosis not present

## 2018-10-03 DIAGNOSIS — I480 Paroxysmal atrial fibrillation: Secondary | ICD-10-CM

## 2018-10-03 MED ORDER — POTASSIUM CHLORIDE ER 10 MEQ PO TBCR: 20.0000 meq | EXTENDED_RELEASE_TABLET | ORAL | 3 refills | Status: DC

## 2018-10-03 MED ORDER — FUROSEMIDE 20 MG PO TABS: 20.0000 mg | ORAL_TABLET | ORAL | 3 refills | Status: DC

## 2018-10-03 MED ORDER — METOPROLOL SUCCINATE ER 50 MG PO TB24: 50.0000 mg | ORAL_TABLET | Freq: Every day | ORAL | 3 refills | Status: DC

## 2018-10-03 MED ORDER — APIXABAN 5 MG PO TABS: 5.0000 mg | ORAL_TABLET | Freq: Two times a day (BID) | ORAL | 6 refills | Status: DC

## 2018-10-03 NOTE — Patient Instructions (Addendum)
Medication Instructions:   Please check the price of eliquis 5 mg twice a day  Please stop the diltiazem This can cause leg swelling  Start metoprolol succinate 50 mg once a day  Try lasix/furosemide 20 mg  with potassium 20 meq twice a week  If you need a refill on your cardiac medications before your next appointment, please call your pharmacy.    Lab work: No new labs needed   If you have labs (blood work) drawn today and your tests are completely normal, you will receive your results only by: Marland Kitchen MyChart Message (if you have MyChart) OR . A paper copy in the mail If you have any lab test that is abnormal or we need to change your treatment, we will call you to review the results.   Testing/Procedures: No new testing needed   Follow-Up: At [facility], you and your health needs are our priority.  As part of our continuing mission to provide you with exceptional heart care, we have created designated Provider Care Teams.  These Care Teams include your primary Cardiologist (physician) and Advanced Practice Providers (APPs -  Physician Assistants and Nurse Practitioners) who all work together to provide you with the care you need, when you need it.  . You will need a follow up appointment in 6 months .   Please call our office 2 months in advance to schedule this appointment.    . Providers on your designated Care Team:   . Murray Hodgkins, NP . Christell Faith, PA-C . Marrianne Mood, PA-C  Any Other Special Instructions Will Be Listed Below (If Applicable).  For educational health videos Log in to : [email or web address] Or : [email or web address], password : [email or web address]

## 2018-10-17 ENCOUNTER — Ambulatory Visit: Payer: Self-pay | Admitting: *Deleted

## 2018-10-17 NOTE — Telephone Encounter (Signed)
FYI, pt had been scheduled to see L. Guse, NP on 10/18/2018 @ 10:20am

## 2018-10-17 NOTE — Telephone Encounter (Signed)
Pt called with having pain in her lower back and into her thighs. She stated that trying to walk her dog and going up stairs she gets short of breath and has some dizziness along with the back pain. This pain she has all the time. Some days is worst than others. She also sometimes will get a pain under the breast, under the rib. Sometimes it is sharp but does not last very long. She saw her cardiologist on on 10/03/2018.  She uses a pain patch on her lower back and cut another one in half and put each on the back of each thigh. At night she takes Tylenol and feels like that helps her.  She denies numbness or tingling of extremities. Appointment scheduled per protocol.  Pt voiced understanding and will call back for increase in symptoms. Routing to flow at [facility] at [facility].   Reason for Disposition . [1] MODERATE back pain (e.g., interferes with normal activities) AND [2] present > 3 days  Answer Assessment - Initial Assessment Questions 1. ONSET: "When did the pain begin?"      A few weeks 2. LOCATION: "Where does it hurt?" (upper, mid or lower back)     Lower back and buttocks 3. SEVERITY: "How bad is the pain?"  (e.g., Scale 1-10; mild, moderate, or severe)   - MILD (1-3): doesn't interfere with normal activities    - MODERATE (4-7): interferes with normal activities or awakens from sleep    - SEVERE (8-10): excruciating pain, unable to do any normal activities      Pain # 8 yesterday but not as bad today 4. PATTERN: "Is the pain constant?" (e.g., yes, no; constant, intermittent)      Yes sometimes worst at times 5. RADIATION: "Does the pain shoot into your legs or elsewhere?"     yes 6. CAUSE:  "What do you think is causing the back pain?"      Not sure 7. BACK OVERUSE:  "Any recent lifting of heavy objects, strenuous work or exercise?"     no 8. MEDICATIONS: "What have you taken so far for the pain?" (e.g., nothing, acetaminophen, NSAIDS)     Pain patch, tylenol 9.  NEUROLOGIC SYMPTOMS: "Do you have any weakness, numbness, or problems with bowel/bladder control?"     Weak at times,  10. OTHER SYMPTOMS: "Do you have any other symptoms?" (e.g., fever, abdominal pain, burning with urination, blood in urine)       none 11. PREGNANCY: "Is there any chance you are pregnant?" (e.g., yes, no; LMP)       n/a  Protocols used: BACK PAIN-A-AH

## 2018-10-18 ENCOUNTER — Ambulatory Visit (INDEPENDENT_AMBULATORY_CARE_PROVIDER_SITE_OTHER): Payer: Medicare Other | Admitting: Family Medicine

## 2018-10-18 ENCOUNTER — Ambulatory Visit (INDEPENDENT_AMBULATORY_CARE_PROVIDER_SITE_OTHER): Payer: Medicare Other

## 2018-10-18 ENCOUNTER — Encounter: Payer: Self-pay | Admitting: Family Medicine

## 2018-10-18 VITALS — BP 118/62 | HR 55 | Temp 98.1°F | Resp 18 | Ht 65.0 in | Wt 183.8 lb

## 2018-10-18 DIAGNOSIS — M545 Low back pain, unspecified: Secondary | ICD-10-CM

## 2018-10-18 DIAGNOSIS — I480 Paroxysmal atrial fibrillation: Secondary | ICD-10-CM

## 2018-10-18 DIAGNOSIS — M47816 Spondylosis without myelopathy or radiculopathy, lumbar region: Secondary | ICD-10-CM | POA: Diagnosis not present

## 2018-10-18 MED ORDER — METHYLPREDNISOLONE 4 MG PO TBPK: ORAL_TABLET | ORAL | 0 refills | Status: DC

## 2018-10-18 NOTE — Telephone Encounter (Signed)
Saw cardiology 10/03/18.  Reported sob, leg swelling, etc.  Note reviewed.  Diltiazem stopped.  On metoprolol.  Per note, appt schedled with Lauren for evaluation.

## 2018-10-18 NOTE — Patient Instructions (Signed)
Take steroid taper  You may use tylenol also as needed  Try topical rub like biofreeze or the deep blue rub your daughter has suggested

## 2018-10-18 NOTE — Progress Notes (Signed)
Subjective:    Patient ID: Kerri Mills, female    DOB: 15-Aug-1931, 85 y.o.   MRN: [id number]  HPI   Patient presents to clinic complaining of low back pain on both sides that goes into both buttocks off and on for the past 3 weeks.  Patient states she has been using Tylenol as needed, still continues to have the achiness in low back and buttocks.  Denies any new injury to back.  Denies any recent heavy lifting, denies falls.  Patient states she tries to take the stairs at her apartment complex to keep self initiate.  Patient states the stairs are 2 flights of 9 steps each.    Also notes at times she will feel a little breathless after finishing going up the stairs.  Patient does have a history of atrial fibrillation, follows regularly with cardiology for this.  Currently on Eliquis and metoprolol to control heart rate and for blood thinning purposes.  Patient recently saw Dr. Rockey Situ for her cardiology follow-up in the beginning of January 2020, I was able to review his note, patient's A. fib is stable.  Currently patient denies any breathlessness, had no issues walking into clinic from her car or walking down hall to exam room.  Patient Active Problem List   Diagnosis Date Noted  . Varicose veins of both lower extremities with inflammation 09/21/2018  . Swelling of lower extremity 08/13/2018  . Right knee pain 05/30/2018  . Fall 05/30/2018  . Sprain of knee 05/30/2018  . Abrasion, knee 05/30/2018  . Osteoarthritis of knee 05/30/2018  . Anemia 02/15/2018  . Bilateral knee pain 11/12/2017  . Atrial fibrillation with rapid ventricular response ([place])   . Demand ischemia ([place])   . Essential hypertension   . Chest pain 09/29/2017  . Dermatitis 09/26/2017  . Osteoarthritis of both knees 06/12/2017  . Osteoporosis, senile 06/12/2017  . Right leg swelling 05/25/2017  . Joint ache 05/25/2017  . Hypercholesterolemia 02/17/2017  . Cough 01/23/2017  . Hyperglycemia 06/18/2016  . Abnormal  mammogram of left breast 01/21/2015  . Knee pain, left 11/19/2014  . Diverticulosis of colon without hemorrhage 05/07/2014  . Diverticulosis of large intestine 05/07/2014  . History of breast cancer 01/20/2014  . Stress 06/15/2013  . Obstructive sleep apnea 06/15/2013  . Atrial fibrillation ([place]) 06/15/2013  . Posttraumatic hematoma of left breast 02/19/2013  . Lump or mass in breast 02/19/2013  . Infiltrating lobular carcinoma of left breast 03/19/2011  . Melanoma in situ of upper extremity ([place]) 03/19/2011  . Thyroid cancer ([place]) 03/19/2011  . GERD (gastroesophageal reflux disease)   . GERD 05/26/2009   Social History   Tobacco Use  . Smoking status: Never Smoker  . Smokeless tobacco: Never Used  Substance Use Topics  . Alcohol use: Yes    Alcohol/week: 0.0 standard drinks    Comment: social drinking. average times a week    Review of Systems   Constitutional: Negative for chills, fatigue and fever.  HENT: Negative for congestion, ear pain, sinus pain and sore throat.   Eyes: Negative.   Respiratory: Negative for cough, shortness of breath and wheezing.   Cardiovascular: Negative for chest pain, palpitations and leg swelling.  Gastrointestinal: Negative for abdominal pain, diarrhea, nausea and vomiting.  Genitourinary: Negative for dysuria, frequency and urgency.  Musculoskeletal: +low back pain into buttocks Skin: Negative for color change, pallor and rash.  Neurological: Negative for syncope, light-headedness and headaches.  Psychiatric/Behavioral: The patient is not nervous/anxious.  Objective:   Physical Exam  Constitutional: She appears well-developed and well-nourished. No distress.  HENT:  Head: Normocephalic and atraumatic.  Eyes: Pupils are equal, round, and reactive to light. EOM are normal. No scleral icterus.  Neck: Normal range of motion. Neck supple. No tracheal deviation present.  Cardiovascular: Bradycardiac at 55, regular rhythm and normal  heart sounds. Trace LE edema, chronic issue, wears support hose.  Pulmonary/Chest: Effort normal and breath sounds normal. No respiratory distress. She has no wheezes. She has no rales.  Abdominal: Soft. Bowel sounds are normal. There is no tenderness.  Neurological: She is alert and oriented to person, place, and time. Gait normal  Musculoskeletal: Tenderness with palpation of lumbar spine and bilateral paraspinal muscles, also tenderness in top of buttocks bilaterally.  Straight leg raises bilaterally does not cause pain to be worse.  Patient is able to bend forward backward, lean side to side and twist side to side without issue. Skin: Skin is warm and dry. No pallor.  Psychiatric: She has a normal mood and affect. Her behavior is normal. Thought content normal.   Nursing note and vitals reviewed.   Vitals:   10/18/18 1029  BP: 118/62  Pulse: (!) 55  Resp: 18  Temp: 98.1 F (36.7 C)  SpO2: 97%      Assessment & Plan:    A total of 25  minutes were spent face-to-face with the patient during this encounter and over half of that time was spent on counseling and coordination of care. The patient was counseled on back pain, getting xray, using steroids, her Afib.   Acute bilateral low back pain - patient will get x-ray of lumbar spine in clinic.  She will take low-dose steroid taper and has been advised to use Tylenol as needed.  She will also try a topical rub like a Biofreeze or deep blue muscle. rub patient's daughter uses from her essential oils.  Atrial fibrillation - patient's heart rate is well controlled with metoprolol, she is anticoagulated with Eliquis.  Oxygen saturation is 97% in clinic today.  Reassured patient that her A. fib seems well controlled.  Advised at times she could have episodes of breathlessness related to overexertion from going up the flights of stairs, it also could be related to heart going into A. fib and then coming out of A. Fib.  This information  reassuring to patient, declines getting new EKG and chest x-ray in clinic today.  Patient will be been aware of x-ray results when they are available.  Advised to keep regularly scheduled follow-up as planned with PCP.  She will return to clinic sooner if any issues arise.

## 2018-10-23 ENCOUNTER — Telehealth: Payer: Self-pay | Admitting: Cardiovascular Disease

## 2018-10-23 NOTE — Telephone Encounter (Signed)
LMOV for patient to call back.  Patient was taken off of Diltiazem at last office visit.  Just calling to confirm if patient stopped this medication.

## 2018-10-29 NOTE — Telephone Encounter (Signed)
Patient returned call will wait for call back on 10/30/2018

## 2018-10-29 NOTE — Telephone Encounter (Signed)
Patient calling Patient confirms that she does not take diltiazem medication

## 2018-10-29 NOTE — Telephone Encounter (Signed)
Patient called to let us know she is not taking this medication.  It was discontinued at last office visit.

## 2018-10-29 NOTE — Telephone Encounter (Signed)
LMOV for patient to call back to verify if she is taking Diltiazem.

## 2018-10-31 NOTE — Telephone Encounter (Signed)
LMTCB

## 2018-11-02 ENCOUNTER — Telehealth: Payer: Self-pay | Admitting: Internal Medicine

## 2018-11-02 NOTE — Telephone Encounter (Signed)
Left detailed msg for pt

## 2018-11-02 NOTE — Telephone Encounter (Signed)
Copied from [place] [id number]. Topic: General - Inquiry >> Nov 02, 2018  8:29 AM Margot Ables wrote: Reason for CRM: Conan Bowens called to check status of written order being signed for PT and OT. She faxed it 10/29/2018 to [phone]. I have advised her to refax this morning. She is hoping for it to be returned today so they can start services for the pt on Monday 2/10 or Tuesday 2/11. Please follow up.

## 2018-11-02 NOTE — Telephone Encounter (Signed)
Orders refaxed.

## 2018-11-06 ENCOUNTER — Telehealth: Payer: Self-pay | Admitting: Cardiovascular Disease

## 2018-11-06 ENCOUNTER — Other Ambulatory Visit: Payer: Self-pay | Admitting: Internal Medicine

## 2018-11-06 DIAGNOSIS — R2689 Other abnormalities of gait and mobility: Secondary | ICD-10-CM | POA: Diagnosis not present

## 2018-11-06 DIAGNOSIS — R278 Other lack of coordination: Secondary | ICD-10-CM | POA: Diagnosis not present

## 2018-11-06 DIAGNOSIS — M6281 Muscle weakness (generalized): Secondary | ICD-10-CM | POA: Diagnosis not present

## 2018-11-06 NOTE — Telephone Encounter (Signed)
Spoke with patient. States she is not having chest pain now. She had episode of crushing chest pain as she was waking up one morning about 2 weeks ago. She cannot recall how long it lasted but she layed there a while and was hard to breath and her left lower arm was aching.  She did finally get up. She did not tell anyone until today when someone at her assisted living facility at [facility] came in to evaluate her for therapy. They do not want to start therapy until she is seen by cardiology. Patient would like peace of mind as well.  She said ever since before Christmas she just gets so "breathless" and it is hard to do activities such as walking her dog as well as she used too.  Scheduled her to see Thurmond Butts on 11/09/18 to discuss new symptoms.

## 2018-11-06 NOTE — Telephone Encounter (Signed)
Need to confirm with pt if she is taking regularly.  After reviewing record, we did refill last time, but prior to this we had not refilled the medication.

## 2018-11-06 NOTE — Telephone Encounter (Signed)
Pt c/o of Chest Pain: STAT if CP now or developed within 24 hours  1. Are you having CP right now? No   2. Are you experiencing any other symptoms (ex. SOB, nausea, vomiting, sweating)?SOB L lower arm aching   3. How long have you been experiencing CP? 2 wks ago   4. Is your CP continuous or coming and going? 1 episode crushing chest pain   5. Have you taken Nitroglycerin? No    ?

## 2018-11-07 DIAGNOSIS — R278 Other lack of coordination: Secondary | ICD-10-CM | POA: Diagnosis not present

## 2018-11-07 DIAGNOSIS — R2689 Other abnormalities of gait and mobility: Secondary | ICD-10-CM | POA: Diagnosis not present

## 2018-11-07 DIAGNOSIS — M6281 Muscle weakness (generalized): Secondary | ICD-10-CM | POA: Diagnosis not present

## 2018-11-07 DIAGNOSIS — H353221 Exudative age-related macular degeneration, left eye, with active choroidal neovascularization: Secondary | ICD-10-CM | POA: Diagnosis not present

## 2018-11-07 NOTE — Telephone Encounter (Signed)
Left detailed message for pt 

## 2018-11-07 NOTE — Telephone Encounter (Signed)
I sent in rx for cymbalta #90 with one refill.

## 2018-11-07 NOTE — Telephone Encounter (Signed)
This medication was previously filled by psychiatry. She has been taking regularly and stated that she had spoke with you about taking over refilling. She is out of medication as of today

## 2018-11-08 NOTE — Progress Notes (Signed)
Cardiology Office Note Date:  11/09/2018  Patient ID:  Kerri, Mills 1931/04/08, MRN [id number] PCP:  Einar Pheasant, MD  Cardiologist:  Dr. Rockey Situ, MD    Chief Complaint: Exertional chest tightness and shortness of breath  History of Present Illness: Kerri Mills is a 85 y.o. female with history of persistent Afib on Eliquis, remote DVT in 1992, LBBB, breast cancer status post lumpectomy and radiation therapy, hypertension, hyperlipidemia, sleep apnea, lower extremity edema, diverticulitis, thyroid cancer, melanoma, depression, gastric ulcer, and GERD who presents for evaluation of chest tightness and shortness of breath.  Patient was admitted to the hospital in 09/2017 with chest pain and A. fib with RVR.  Troponin peaked at 0.03.  Echo showed an EF of 55 to 60%, normal wall motion, moderate concentric LVH, grade 1 diastolic dysfunction, moderately dilated left atrium, RV cavity size was normal with normal RV systolic function, mild tricuspid regurgitation, PASP 28 mmHg, trivial pericardial effusion was identified posterior to the heart. She was rate controlled and developed bradycardia with both metoprolol and diltiazem.   She was most recently seen in the office on 10/03/2018 with noted worsening lower extremity edema. She was noted to have been wearing compression stockings for multiple years. Weight was 178 pounds, which was down from 185 pounds at her visit in 09/2017. She was maintaining sinus rhythm with a bradycardic rate. Her diltiazem was held and she was started on Toprol XL. Lasix 20 mg was also started. It appears, she may have been started on Eliquis at that visit in place of Coumadin.   Labs: 08/2018 - INR 2.3 06/2018 - K+ 4.0, SCr 0.86, TSH normal, LDL 93, AST/ALT normal, A1c 6.1 04/2018 - HGB 12.3  Patient called on 11/06/2018 noting a 2 week history of left lower arm pain with SOB and 1 episode of "crushing" chest pain 2 weeks prior one morning. At that time, she did not  tell anyone. She reported since 08/2018 she had noted a decline in her functional status and increased SOB. Appointment was made for today.   Patient comes in today noting decreased functional capacity with increase in exertional fatigue and shortness of breath that dates back to 08/2018.  She has also noted one episode of "crushing" chest pain 2 weeks prior that woke her up from sleep approximately at 6 AM.  She is uncertain how long this chest pain lasted stating "I do not know if it lasted 2 minutes for 5 minutes."  Since then, she has been chest pain-free, however she has continued to note intermittent left elbow ache that radiates distally to her left hand.  This is not associated with exertion.  However, her shortness of breath is associated with exertion with the patient now having to stop and lean against the rails frequently to catch her breath.  She notes no improvement with her lower extremity swelling since discontinuing diltiazem and starting Lasix 20 mg on Sundays and Tuesdays as directed at her last office visit.  Her weight is up 2 pounds from her last office visit.  With the discontinuation of diltiazem and initiation of metoprolol she has noted higher blood pressure readings.  She has transitioned from Coumadin to Eliquis and has not missed any doses.  No falls since she was last seen.  No BRBPR or melena.  She denies any abdominal distention, orthopnea, PND, or early satiety.  No dizziness, presyncope, or syncope.  She is currently symptom free.  Past Medical History:  Diagnosis Date  .  Allergy   . Anxiety   . Arthritis   . Clotting disorder ([place])   . Colitis   . Depression   . Diverticulitis 2013  . Gastric ulcer   . GERD (gastroesophageal reflux disease)   . Hypercholesterolemia   . Hypertension   . Infiltrating lobular carcinoma of left breast 2011   T2,N0, ER: 90%; PR 0%; Her 2 neu not amplified. [facility]).  . Melanoma ([place]) 1997  . Melanoma in situ of upper  extremity ([place]) 03/19/2011  . Persistent atrial fibrillation    a. CHADS2VASc => 4 (HTN, age x 2, female)  . Personal history of radiation therapy 2011   BREAST CA  . Seroma    HISTORY OF LFT BREAST  . Sleep apnea   . Thyroid cancer ([place]) 1992    Past Surgical History:  Procedure Laterality Date  . ABDOMINAL HYSTERECTOMY  1973   partial  . BREAST BIOPSY Left 02-13-13   BENIGN BREAST TISSUE WITH CHANGES CONSISTENT WITH FAT NECROSIS  . BREAST BIOPSY Left 01/21/2015   bx done in [facility] office 11:00 left 6-8cmfn  . BREAST EXCISIONAL BIOPSY Left 1995   neg  . BREAST EXCISIONAL BIOPSY Left 2011   Breast cancer radiation  . BREAST LUMPECTOMY Left 2011   BREAST CA  . CARDIAC CATHETERIZATION    . CHOLECYSTECTOMY    . COLONOSCOPY  2013  . MELANOMA EXCISION     RT UPPER ARM  . PARTIAL HYSTERECTOMY     bleeding, ovaries in place.    . THYROID SURGERY  1992   FOR THYROID CANCER  . TONSILLECTOMY      Current Meds  Medication Sig  . albuterol (PROAIR HFA) 108 (90 Base) MCG/ACT inhaler Inhale 2 puffs into the lungs every 6 (six) hours as needed for wheezing or shortness of breath.  Marland Kitchen apixaban (ELIQUIS) 5 MG TABS tablet Take 1 tablet (5 mg total) by mouth 2 (two) times daily.  . Cholecalciferol (VITAMIN D) 1000 UNITS capsule Take 1,000 Units by mouth daily.    . DULoxetine (CYMBALTA) 60 MG capsule TAKE 1 CAPSULE (60 MG TOTAL) BY MOUTH AT BEDTIME.  . ferrous sulfate 325 (65 FE) MG tablet Take 325 mg by mouth every morning.  . furosemide (LASIX) 20 MG tablet Take 1 tablet (20 mg total) by mouth as directed. Take 1 tablet (20 mg) twice a week with potassium  . gabapentin (NEURONTIN) 300 MG capsule TAKE 2 CAPSULES (600 MG TOTAL) BY MOUTH AT BEDTIME.  Marland Kitchen levothyroxine (SYNTHROID, LEVOTHROID) 88 MCG tablet TAKE ONE TABLET BY MOUTH ONCE DAILY BEFORE BREAKFAST  . lovastatin (MEVACOR) 40 MG tablet Take 1 tablet (40 mg total) by mouth daily.  . metoprolol succinate (TOPROL-XL) 50 MG 24 hr tablet  Take 1 tablet (50 mg total) by mouth daily. Take with or immediately following a meal.  . Multiple Vitamins-Minerals (PRESERVISION AREDS 2) CAPS Take 1 tablet by mouth 2 (two) times daily.   Marland Kitchen omeprazole (PRILOSEC) 20 MG capsule TAKE 1 CAPSULE (20 MG TOTAL) BY MOUTH DAILY.  Marland Kitchen potassium chloride (K-DUR) 10 MEQ tablet Take 2 tablets (20 mEq total) by mouth as directed. Take 2 tablets (20 mEq) twice a week with Lasix  . propranolol (INDERAL) 10 MG tablet TAKE 1 TABLET (10 MG TOTAL) BY MOUTH DAILY AS NEEDED.  Marland Kitchen triamcinolone cream (KENALOG) 0.1 % Apply 1 application topically 2 (two) times daily.  . vitamin B-12 (CYANOCOBALAMIN) 1000 MCG tablet Take 1,000 mcg by mouth daily.   Current  Facility-Administered Medications for the 11/09/18 encounter (Office Visit) with Rise Mu, PA-C  Medication  . albuterol (PROVENTIL) (2.5 MG/3ML) 0.083% nebulizer solution 2.5 mg    Allergies:   Atorvastatin; Lipitor [atorvastatin calcium]; and Penicillins   Social History:  The patient  reports that she has never smoked. She has never used smokeless tobacco. She reports current alcohol use. She reports that she does not use drugs.   Family History:  The patient's family history includes Cancer in her brother and sister; Heart disease in her mother.  ROS:   Review of Systems  Constitutional: Positive for malaise/fatigue. Negative for chills, diaphoresis, fever and weight loss.  HENT: Negative for congestion.   Eyes: Negative for discharge and redness.  Respiratory: Positive for shortness of breath. Negative for cough, hemoptysis, sputum production and wheezing.   Cardiovascular: Positive for chest pain and leg swelling. Negative for palpitations, orthopnea, claudication and PND.  Gastrointestinal: Negative for abdominal pain, blood in stool, heartburn, melena, nausea and vomiting.  Genitourinary: Negative for hematuria.  Musculoskeletal: Positive for myalgias. Negative for falls.  Skin: Negative for rash.    Neurological: Positive for weakness. Negative for dizziness, tingling, tremors, sensory change, speech change, focal weakness and loss of consciousness.  Endo/Heme/Allergies: Does not bruise/bleed easily.  Psychiatric/Behavioral: Negative for substance abuse. The patient is not nervous/anxious.   All other systems reviewed and are negative.    PHYSICAL EXAM:  VS:  BP (!) 160/86 (BP Location: Left Arm, Patient Position: Sitting, Cuff Size: Normal)   Pulse (!) 53   Ht 5' 5.5" (1.664 m)   Wt 180 lb 12 oz (82 kg)   BMI 29.62 kg/m  BMI: Body mass index is 29.62 kg/m.  Physical Exam  Constitutional: She is oriented to person, place, and time. She appears well-developed and well-nourished.  HENT:  Head: Normocephalic and atraumatic.  Eyes: Right eye exhibits no discharge. Left eye exhibits no discharge.  Neck: Normal range of motion. No JVD present.  Cardiovascular: Regular rhythm, S1 normal, S2 normal and normal heart sounds. Bradycardia present. Exam reveals no distant heart sounds, no friction rub, no midsystolic click and no opening snap.  No murmur heard. Pulses:      Posterior tibial pulses are 2+ on the right side and 2+ on the left side.  Pulmonary/Chest: Effort normal and breath sounds normal. No respiratory distress. She has no decreased breath sounds. She has no wheezes. She has no rales. She exhibits no tenderness.  Abdominal: Soft. She exhibits no distension. There is no abdominal tenderness.  Musculoskeletal:        General: Edema present.     Comments: Bilateral compression stockings are noted with trace to 1+ bilateral lower extremity pitting edema.  Neurological: She is alert and oriented to person, place, and time.  Skin: Skin is warm and dry. No cyanosis. Nails show no clubbing.  Psychiatric: She has a normal mood and affect. Her speech is normal and behavior is normal. Judgment and thought content normal.     EKG:  Was ordered and interpreted by me today. Shows  sinus bradycardia, 53 bpm, left axis deviation, nonspecific ST-T changes  Recent Labs: 05/22/2018: Hemoglobin 12.3; Platelets 216 07/19/2018: ALT 12; BUN 14; Creatinine, Ser 0.86; Potassium 4.0; Sodium 141; TSH 2.68  07/19/2018: Cholesterol 168; HDL 41.70; LDL Cholesterol 93; Total CHOL/HDL Ratio 4; Triglycerides 166.0; VLDL 33.2   CrCl cannot be calculated (Patient's most recent lab result is older than the maximum 21 days allowed.).   Wt Readings from  Last 3 Encounters:  11/09/18 180 lb 12 oz (82 kg)  10/18/18 183 lb 12.8 oz (83.4 kg)  10/03/18 178 lb (80.7 kg)     Other studies reviewed: Additional studies/records reviewed today include: summarized above  ASSESSMENT AND PLAN:  1. Persistent A. fib: Maintaining sinus rhythm with a bradycardic heart rate.  Decrease Toprol-XL to 25 mg daily.  Continue Eliquis 5 mg twice daily.  Check CBC and BMP.  2. Chest pain with moderate risk for cardiac etiology: Currently symptom-free.  Schedule Lexiscan Myoview to evaluate for high risk ischemia.  On Eliquis in place of aspirin.  3. Exertional dyspnea/fatigue: Schedule echocardiogram as well as Lexiscan as above.  Check CBC, BNP, BMP, and TSH.  Decrease metoprolol as outlined above in an effort to allow for less bradycardic heart rates.  4. Lower extremity edema: This has been a longstanding issue for her and felt to be in the setting of dependent edema with chronic venous insufficiency.  She has not noted any improvement in her lower extremity swelling with the discontinuation of diltiazem nor with the initiation of Lasix.  I recommend she continue compression stockings and leg elevation with Lasix to be used as directed by primary cardiologist.  Check echocardiogram as outlined above.  5. Hypertension: Since discontinuing diltiazem and starting metoprolol, her blood pressure readings have been trending higher.  Given we are decreasing her Toprol-XL to 25 mg daily as above secondary to bradycardia  and fatigue we will start her on lisinopril 10 mg daily.  She will need follow-up BMP in 1 week to ensure she is tolerating ACE inhibitor.  Disposition: F/u with Dr. Rockey Situ or an APP in 1 month.  Current medicines are reviewed at length with the patient today.  The patient did not have any concerns regarding medicines.  Signed, Christell Faith, PA-C 11/09/2018 10:58 AM     [facility] [address] [place] [address] [place], [place] [address] [phone]

## 2018-11-09 ENCOUNTER — Encounter: Payer: Self-pay | Admitting: Physician Assistant

## 2018-11-09 ENCOUNTER — Ambulatory Visit (INDEPENDENT_AMBULATORY_CARE_PROVIDER_SITE_OTHER): Payer: Medicare Other | Admitting: Physician Assistant

## 2018-11-09 VITALS — BP 160/86 | HR 53 | Ht 65.5 in | Wt 180.8 lb

## 2018-11-09 DIAGNOSIS — R079 Chest pain, unspecified: Secondary | ICD-10-CM | POA: Diagnosis not present

## 2018-11-09 DIAGNOSIS — M7989 Other specified soft tissue disorders: Secondary | ICD-10-CM

## 2018-11-09 DIAGNOSIS — I10 Essential (primary) hypertension: Secondary | ICD-10-CM | POA: Diagnosis not present

## 2018-11-09 DIAGNOSIS — I480 Paroxysmal atrial fibrillation: Secondary | ICD-10-CM

## 2018-11-09 DIAGNOSIS — R0609 Other forms of dyspnea: Secondary | ICD-10-CM | POA: Diagnosis not present

## 2018-11-09 DIAGNOSIS — R5383 Other fatigue: Secondary | ICD-10-CM | POA: Diagnosis not present

## 2018-11-09 DIAGNOSIS — I4819 Other persistent atrial fibrillation: Secondary | ICD-10-CM

## 2018-11-09 MED ORDER — METOPROLOL SUCCINATE ER 25 MG PO TB24: 25.0000 mg | ORAL_TABLET | Freq: Every day | ORAL | 3 refills | Status: DC

## 2018-11-09 MED ORDER — LISINOPRIL 10 MG PO TABS: 10.0000 mg | ORAL_TABLET | Freq: Every day | ORAL | 3 refills | Status: DC

## 2018-11-09 NOTE — Patient Instructions (Addendum)
Medication Instructions:  Your physician has recommended you make the following change in your medication:  1- START Lisinopril Take 1 tablet (10 mg total) by mouth daily 2- DECREASE Toprol to 1 tablet ( 25 mg) once daily   If you need a refill on your cardiac medications before your next appointment, please call your pharmacy.   Lab work: Your physician recommends that you return for lab work today (CBC, BNP, BMET, TSH)  If you have labs (blood work) drawn today and your tests are completely normal, you will receive your results only by: Marland Kitchen MyChart Message (if you have MyChart) OR . A paper copy in the mail If you have any lab test that is abnormal or we need to change your treatment, we will call you to review the results.  Testing/Procedures: 1- Echo  Your physician has requested that you have an echocardiogram. Echocardiography is a painless test that uses sound waves to create images of your heart. It provides your doctor with information about the size and shape of your heart and how well your heart's chambers and valves are working. This procedure takes approximately one hour. There are no restrictions for this procedure.  2- [place]  Your caregiver has ordered a Stress Test with nuclear imaging. The purpose of this test is to evaluate the blood supply to your heart muscle. This procedure is referred to as a "Non-Invasive Stress Test." This is because other than having an IV started in your vein, nothing is inserted or "invades" your body. Cardiac stress tests are done to find areas of poor blood flow to the heart by determining the extent of coronary artery disease (CAD). Some patients exercise on a treadmill, which naturally increases the blood flow to your heart, while others who are  unable to walk on a treadmill due to physical limitations have a pharmacologic/chemical stress agent called Lexiscan . This medicine will mimic walking on a treadmill by temporarily  increasing your coronary blood flow.   Please note: these test may take anywhere between 2-4 hours to complete  PLEASE REPORT TO [place] AT THE FIRST DESK WILL DIRECT YOU WHERE TO GO  Date of Procedure:_____________________________________  Arrival Time for Procedure:______________________________  Instructions regarding medication:   __x__ : Hold diuretic medication morning of procedure  __x__:  Hold betablocker(s) night before procedure and morning of procedure  ___Do not take propranolol._________________  PLEASE NOTIFY THE OFFICE AT LEAST 24 HOURS IN ADVANCE IF YOU ARE UNABLE TO KEEP YOUR APPOINTMENT.  [phone] AND  PLEASE NOTIFY NUCLEAR MEDICINE AT [facility] AT LEAST 24 HOURS IN ADVANCE IF YOU ARE UNABLE TO KEEP YOUR APPOINTMENT. [phone]  How to prepare for your Myoview test:  1. Do not eat or drink after midnight 2. No caffeine for 24 hours prior to test 3. No smoking 24 hours prior to test. 4. Your medication may be taken with water.  If your doctor stopped a medication because of this test, do not take that medication. 5. Ladies, please do not wear dresses.  Skirts or pants are appropriate. Please wear a short sleeve shirt. 6. No perfume, cologne or lotion. 7. Wear comfortable walking shoes. No heels!   Follow-Up: At [facility], you and your health needs are our priority.  As part of our continuing mission to provide you with exceptional heart care, we have created designated Provider Care Teams.  These Care Teams include your primary Cardiologist (physician) and Advanced Practice Providers (APPs -  Physician Assistants and Nurse Practitioners) who all work together to provide you with the care you need, when you need it. You will need a follow up appointment in 1 months.  You may see Dr. Rockey Situ or Christell Faith, PA-C

## 2018-11-10 LAB — CBC
Hematocrit: 37.8 % (ref 34.0–46.6)
Hemoglobin: 12.1 g/dL (ref 11.1–15.9)
MCH: 28.8 pg (ref 26.6–33.0)
MCHC: 32 g/dL (ref 31.5–35.7)
MCV: 90 fL (ref 79–97)
Platelets: 206 10*3/uL (ref 150–450)
RBC: 4.2 x10E6/uL (ref 3.77–5.28)
RDW: 12.3 % (ref 11.7–15.4)
WBC: 5.4 10*3/uL (ref 3.4–10.8)

## 2018-11-10 LAB — BRAIN NATRIURETIC PEPTIDE: BNP: 362.6 pg/mL — ABNORMAL HIGH (ref 0.0–100.0)

## 2018-11-10 LAB — BASIC METABOLIC PANEL
BUN / CREAT RATIO: 16 (ref 12–28)
BUN: 12 mg/dL (ref 8–27)
CO2: 24 mmol/L (ref 20–29)
Calcium: 9.7 mg/dL (ref 8.7–10.3)
Chloride: 102 mmol/L (ref 96–106)
Creatinine, Ser: 0.77 mg/dL (ref 0.57–1.00)
GFR calc Af Amer: 81 mL/min/{1.73_m2} (ref >59)
GFR calc non Af Amer: 71 mL/min/{1.73_m2} (ref >59)
Glucose: 112 mg/dL — ABNORMAL HIGH (ref 65–99)
Potassium: 4.4 mmol/L (ref 3.5–5.2)
Sodium: 141 mmol/L (ref 134–144)

## 2018-11-10 LAB — TSH: TSH: 2.99 u[IU]/mL (ref 0.450–4.500)

## 2018-11-12 DIAGNOSIS — R278 Other lack of coordination: Secondary | ICD-10-CM | POA: Diagnosis not present

## 2018-11-12 DIAGNOSIS — R2689 Other abnormalities of gait and mobility: Secondary | ICD-10-CM | POA: Diagnosis not present

## 2018-11-12 DIAGNOSIS — M6281 Muscle weakness (generalized): Secondary | ICD-10-CM | POA: Diagnosis not present

## 2018-11-12 NOTE — Telephone Encounter (Signed)
Incoming call from [place] with PT. She needed confirmation from provider, ok to continue with PT. She verbalized understanding.   No new orders at this time.

## 2018-11-12 NOTE — Telephone Encounter (Signed)
No answer. Left message to call back.   

## 2018-11-12 NOTE — Telephone Encounter (Signed)
Cannon Kettle, patient's physical therapist calling  States that BP is 158/100 at rest - HR 60 Patient has not taken any medication due to oversleeping and will be picking up the new medication today Patient is currently not symptomatic  Anderson Malta would like to know if Dr Rockey Situ is ok with patient doing physical therapy and if so are there any restrictions  Please call to discuss at [phone]

## 2018-11-12 NOTE — Telephone Encounter (Signed)
Please advise 

## 2018-11-12 NOTE — Telephone Encounter (Signed)
Call to patient to review results. I gave her information from provider that it was okay to continue with PT and take medications as prescribed.   She verbalized understanding and had no further questions at this time.   Advised pt to call for any further questions or concerns.

## 2018-11-12 NOTE — Telephone Encounter (Signed)
Recommend patient take medications as directed.  Okay to proceed with physical therapy.

## 2018-11-14 DIAGNOSIS — R278 Other lack of coordination: Secondary | ICD-10-CM | POA: Diagnosis not present

## 2018-11-14 DIAGNOSIS — M6281 Muscle weakness (generalized): Secondary | ICD-10-CM | POA: Diagnosis not present

## 2018-11-14 DIAGNOSIS — R2689 Other abnormalities of gait and mobility: Secondary | ICD-10-CM | POA: Diagnosis not present

## 2018-11-16 ENCOUNTER — Ambulatory Visit
Admission: RE | Admit: 2018-11-16 | Discharge: 2018-11-16 | Disposition: A | Discharge status: Home / Self Care | Payer: Medicare Other | Source: Ambulatory Visit | Attending: Physician Assistant | Admitting: Physician Assistant

## 2018-11-16 DIAGNOSIS — R079 Chest pain, unspecified: Secondary | ICD-10-CM

## 2018-11-16 MED ORDER — REGADENOSON 0.4 MG/5ML IV SOLN
0.4000 mg | Freq: Once | INTRAVENOUS | Status: AC
  Administered 2018-11-16: 0.4 mg via INTRAVENOUS

## 2018-11-16 MED ORDER — TECHNETIUM TC 99M TETROFOSMIN IV KIT
30.0000 | PACK | Freq: Once | INTRAVENOUS | Status: AC | PRN
  Administered 2018-11-16: 30.72 via INTRAVENOUS

## 2018-11-16 MED ORDER — TECHNETIUM TC 99M TETROFOSMIN IV KIT
10.1100 | PACK | Freq: Once | INTRAVENOUS | Status: AC | PRN
  Administered 2018-11-16: 10.11 via INTRAVENOUS

## 2018-11-17 LAB — NM MYOCAR MULTI W/SPECT W/WALL MOTION / EF
CHL CUP RESTING HR STRESS: 54 {beats}/min
LV dias vol: 67 mL (ref 46–106)
LVSYSVOL: 22 mL
Peak HR: 71 {beats}/min
Percent HR: 52 %
SDS: 0
SRS: 1
SSS: 1
TID: 1.02

## 2018-11-19 ENCOUNTER — Telehealth: Payer: Self-pay

## 2018-11-19 NOTE — Telephone Encounter (Signed)
Call to patient with stress test results. Pt verbalized understanding and had no further questions at this time.   No new orders. Advised pt to call for any further questions or concerns.

## 2018-11-19 NOTE — Telephone Encounter (Signed)
-----   Message from Rise Mu, PA-C sent at 11/18/2018  4:32 PM EST ----- Stress test showed no significant ischemia with an EF of 68%. Low risk scan.

## 2018-11-20 ENCOUNTER — Telehealth: Payer: Self-pay | Admitting: Cardiovascular Disease

## 2018-11-20 DIAGNOSIS — M6281 Muscle weakness (generalized): Secondary | ICD-10-CM | POA: Diagnosis not present

## 2018-11-20 DIAGNOSIS — R278 Other lack of coordination: Secondary | ICD-10-CM | POA: Diagnosis not present

## 2018-11-20 DIAGNOSIS — R2689 Other abnormalities of gait and mobility: Secondary | ICD-10-CM | POA: Diagnosis not present

## 2018-11-20 NOTE — Telephone Encounter (Signed)
Reports faxed to number provided via [place] fax.

## 2018-11-20 NOTE — Telephone Encounter (Signed)
PLEASE FAX LEXISCAN RESULTS TO HEALTHCARE FACILITY. FAX [phone]

## 2018-11-21 ENCOUNTER — Encounter: Payer: Self-pay | Admitting: Internal Medicine

## 2018-11-21 ENCOUNTER — Ambulatory Visit (INDEPENDENT_AMBULATORY_CARE_PROVIDER_SITE_OTHER): Payer: Medicare Other | Admitting: Internal Medicine

## 2018-11-21 ENCOUNTER — Ambulatory Visit
Admission: RE | Admit: 2018-11-21 | Discharge: 2018-11-21 | Disposition: A | Discharge status: Home / Self Care | Payer: Medicare Other | Source: Ambulatory Visit | Attending: Internal Medicine | Admitting: Internal Medicine

## 2018-11-21 VITALS — BP 142/90 | HR 63 | Temp 98.1°F | Resp 16 | Wt 178.2 lb

## 2018-11-21 DIAGNOSIS — W19XXXD Unspecified fall, subsequent encounter: Secondary | ICD-10-CM

## 2018-11-21 DIAGNOSIS — R0602 Shortness of breath: Secondary | ICD-10-CM

## 2018-11-21 DIAGNOSIS — I4819 Other persistent atrial fibrillation: Secondary | ICD-10-CM | POA: Diagnosis not present

## 2018-11-21 DIAGNOSIS — E7800 Pure hypercholesterolemia, unspecified: Secondary | ICD-10-CM | POA: Diagnosis not present

## 2018-11-21 DIAGNOSIS — I10 Essential (primary) hypertension: Secondary | ICD-10-CM | POA: Diagnosis not present

## 2018-11-21 DIAGNOSIS — G44309 Post-traumatic headache, unspecified, not intractable: Secondary | ICD-10-CM | POA: Insufficient documentation

## 2018-11-21 DIAGNOSIS — R739 Hyperglycemia, unspecified: Secondary | ICD-10-CM | POA: Diagnosis not present

## 2018-11-21 DIAGNOSIS — D649 Anemia, unspecified: Secondary | ICD-10-CM | POA: Diagnosis not present

## 2018-11-21 DIAGNOSIS — Z853 Personal history of malignant neoplasm of breast: Secondary | ICD-10-CM

## 2018-11-21 DIAGNOSIS — R05 Cough: Secondary | ICD-10-CM

## 2018-11-21 DIAGNOSIS — Z85850 Personal history of malignant neoplasm of thyroid: Secondary | ICD-10-CM

## 2018-11-21 DIAGNOSIS — M7989 Other specified soft tissue disorders: Secondary | ICD-10-CM

## 2018-11-21 DIAGNOSIS — R059 Cough, unspecified: Secondary | ICD-10-CM

## 2018-11-21 DIAGNOSIS — K219 Gastro-esophageal reflux disease without esophagitis: Secondary | ICD-10-CM

## 2018-11-21 DIAGNOSIS — S0990XA Unspecified injury of head, initial encounter: Secondary | ICD-10-CM | POA: Diagnosis not present

## 2018-11-21 LAB — HEPATIC FUNCTION PANEL
ALT: 16 U/L (ref 0–35)
AST: 18 U/L (ref 0–37)
Albumin: 4.2 g/dL (ref 3.5–5.2)
Alkaline Phosphatase: 135 U/L — ABNORMAL HIGH (ref 39–117)
Bilirubin, Direct: 0.1 mg/dL (ref 0.0–0.3)
TOTAL PROTEIN: 6.9 g/dL (ref 6.0–8.3)
Total Bilirubin: 0.5 mg/dL (ref 0.2–1.2)

## 2018-11-21 LAB — CBC WITH DIFFERENTIAL/PLATELET
Basophils Absolute: 0.1 10*3/uL (ref 0.0–0.1)
Basophils Relative: 1.2 % (ref 0.0–3.0)
Eosinophils Absolute: 0.1 10*3/uL (ref 0.0–0.7)
Eosinophils Relative: 2 % (ref 0.0–5.0)
HEMATOCRIT: 40.3 % (ref 36.0–46.0)
Hemoglobin: 13.3 g/dL (ref 12.0–15.0)
Lymphocytes Relative: 23.5 % (ref 12.0–46.0)
Lymphs Abs: 1.2 10*3/uL (ref 0.7–4.0)
MCHC: 32.9 g/dL (ref 30.0–36.0)
MCV: 91.5 fl (ref 78.0–100.0)
MONOS PCT: 8.4 % (ref 3.0–12.0)
Monocytes Absolute: 0.4 10*3/uL (ref 0.1–1.0)
Neutro Abs: 3.4 10*3/uL (ref 1.4–7.7)
Neutrophils Relative %: 64.9 % (ref 43.0–77.0)
Platelets: 222 10*3/uL (ref 150.0–400.0)
RBC: 4.4 Mil/uL (ref 3.87–5.11)
RDW: 13.4 % (ref 11.5–15.5)
WBC: 5.2 10*3/uL (ref 4.0–10.5)

## 2018-11-21 LAB — PROTIME-INR
INR: 1.5 ratio — ABNORMAL HIGH (ref 0.8–1.0)
Prothrombin Time: 17.5 s — ABNORMAL HIGH (ref 9.6–13.1)

## 2018-11-21 LAB — BASIC METABOLIC PANEL
BUN: 13 mg/dL (ref 6–23)
CO2: 31 mEq/L (ref 19–32)
Calcium: 10 mg/dL (ref 8.4–10.5)
Chloride: 103 mEq/L (ref 96–112)
Creatinine, Ser: 0.84 mg/dL (ref 0.40–1.20)
GFR: 64.38 mL/min (ref >60.00)
Glucose, Bld: 97 mg/dL (ref 70–99)
Potassium: 4 mEq/L (ref 3.5–5.1)
Sodium: 140 mEq/L (ref 135–145)

## 2018-11-21 LAB — HEMOGLOBIN A1C: Hgb A1c MFr Bld: 6.1 % (ref 4.6–6.5)

## 2018-11-21 MED ORDER — LISINOPRIL 20 MG PO TABS: 20.0000 mg | ORAL_TABLET | Freq: Every day | ORAL | 1 refills | Status: DC

## 2018-11-21 NOTE — Progress Notes (Signed)
Patient ID: Kerri Mills, female   DOB: 1931-01-30, 85 y.o.   MRN: [id number]   Subjective:    Patient ID: Kerri Mills, female    DOB: Apr 14, 1931, 85 y.o.   MRN: [id number]  HPI  Patient here for a scheduled follow up.  Since her last visit, she has seen vascular surgery for bilateral lower extremity swelling.   Recommended continuing compression hose with consideration for lymphedema if no improvement.  Leg swelling has improved.  Had cough and congestion and was seen by [name] and Philis Nettle.  Was given abx and tessalon perles. Cough is better.  Still with sob, especially with going up stairs.  Saw cardiology 10/03/18.  Given leg swelling, etc, it was decided to hold diltiazem.  Was started on metoprolol.  Lasix added.  She was reevaluated 11/09/18 by cardiology.  Note reviewed.  Found to be bradycardic. toprol was decreased to 69m q day.  Off coumadin and on eliquis.  Also evaluated for episode of chest pain.  Recommended lexiscan and echo.  Given blood pressure elevated, she was started on lisinopril 153mq day.  Stress test negative.  States she rolled out of bed earlier this week.  Hit her head on her night stand.  Some persistent pain.  Has had headaches prior to this, but some increased localized pain where hit.  No dizziness.  No increased heart rate or palpitations.  No further chest pain.  Does report increased sob with exertion - especially going up steps.  No acid reflux.  No abdominal pain.  Bowels moving.     Past Medical History:  Diagnosis Date  . Allergy   . Anxiety   . Arthritis   . Clotting disorder ([facility]  . Colitis   . Depression   . Diverticulitis 2013  . Gastric ulcer   . GERD (gastroesophageal reflux disease)   . Hypercholesterolemia   . Hypertension   . Infiltrating lobular carcinoma of left breast 2011   T2,N0, ER: 90%; PR 0%; [facility]  . Melanoma ([facility]  . Melanoma in situ of upper extremity ([place]2012  .  Persistent atrial fibrillation    a. CHADS2VASc => 4 (HTN, age x 2, female)  . Personal history of radiation therapy 2011   BREAST CA  . Seroma    HISTORY OF LFT BREAST  . Sleep apnea   . Thyroid cancer ([facility]   Past Surgical History:  Procedure Laterality Date  . ABDOMINAL HYSTERECTOMY  1973   partial  . BREAST BIOPSY Left 02-13-13   BENIGN BREAST TISSUE WITH CHANGES CONSISTENT WITH FAT NECROSIS  . BREAST BIOPSY Left 01/21/2015   bx done in [place] office 11:00 left 6-8cmfn  . BREAST EXCISIONAL BIOPSY Left 1995   neg  . BREAST EXCISIONAL BIOPSY Left 2011   Breast cancer radiation  . BREAST LUMPECTOMY Left 2011   BREAST CA  . CARDIAC CATHETERIZATION    . CHOLECYSTECTOMY    . COLONOSCOPY  2013  . MELANOMA EXCISION     RT UPPER ARM  . PARTIAL HYSTERECTOMY     bleeding, ovaries in place.    . THYROID SURGERY  1992   FOR THYROID CANCER  . TONSILLECTOMY     Family History  Problem Relation Age of Onset  . Heart disease Mother   . Cancer Brother        lung   . Cancer Sister  breast  . Breast cancer Neg Hx    Social History   Socioeconomic History  . Marital status: Widowed    Spouse name: Not on file  . Number of children: Not on file  . Years of education: Not on file  . Highest education level: Not on file  Occupational History  . Not on file  Social Needs  . Financial resource strain: Not hard at all  . Food insecurity:    Worry: Never true    Inability: Never true  . Transportation needs:    Medical: No    Non-medical: No  Tobacco Use  . Smoking status: Never Smoker  . Smokeless tobacco: Never Used  Substance and Sexual Activity  . Alcohol use: Yes    Alcohol/week: 0.0 standard drinks    Comment: social drinking. average times a week  . Drug use: No  . Sexual activity: Never  Lifestyle  . Physical activity:    Days per week: 7 days    Minutes per session: 40 min  . Stress: Not at all  Relationships  . Social connections:    Talks on  phone: Not on file    Gets together: Not on file    Attends religious service: Not on file    Active member of club or organization: Not on file    Attends meetings of clubs or organizations: Not on file    Relationship status: Widowed  Other Topics Concern  . Not on file  Social History Narrative   Independent and baseline. Lives by herself    Outpatient Encounter Medications as of 11/21/2018  Medication Sig  . albuterol (PROAIR HFA) 108 (90 Base) MCG/ACT inhaler Inhale 2 puffs into the lungs every 6 (six) hours as needed for wheezing or shortness of breath.  Marland Kitchen apixaban (ELIQUIS) 5 MG TABS tablet Take 1 tablet (5 mg total) by mouth 2 (two) times daily.  . Cholecalciferol (VITAMIN D) 1000 UNITS capsule Take 1,000 Units by mouth daily.    . DULoxetine (CYMBALTA) 60 MG capsule TAKE 1 CAPSULE (60 MG TOTAL) BY MOUTH AT BEDTIME.  . ferrous sulfate 325 (65 FE) MG tablet Take 325 mg by mouth every morning.  . furosemide (LASIX) 20 MG tablet Take 1 tablet (20 mg total) by mouth as directed. Take 1 tablet (20 mg) twice a week with potassium  . gabapentin (NEURONTIN) 300 MG capsule TAKE 2 CAPSULES (600 MG TOTAL) BY MOUTH AT BEDTIME.  Marland Kitchen levothyroxine (SYNTHROID, LEVOTHROID) 88 MCG tablet TAKE ONE TABLET BY MOUTH ONCE DAILY BEFORE BREAKFAST  . lisinopril (PRINIVIL,ZESTRIL) 20 MG tablet Take 1 tablet (20 mg total) by mouth daily.  . metoprolol succinate (TOPROL-XL) 25 MG 24 hr tablet Take 1 tablet (25 mg total) by mouth daily. Take with or immediately following a meal.  . Multiple Vitamins-Minerals (PRESERVISION AREDS 2) CAPS Take 1 tablet by mouth 2 (two) times daily.   . potassium chloride (K-DUR) 10 MEQ tablet Take 2 tablets (20 mEq total) by mouth as directed. Take 2 tablets (20 mEq) twice a week with Lasix  . propranolol (INDERAL) 10 MG tablet TAKE 1 TABLET (10 MG TOTAL) BY MOUTH DAILY AS NEEDED.  Marland Kitchen triamcinolone cream (KENALOG) 0.1 % Apply 1 application topically 2 (two) times daily.  . vitamin  B-12 (CYANOCOBALAMIN) 1000 MCG tablet Take 1,000 mcg by mouth daily.  . [DISCONTINUED] lisinopril (PRINIVIL,ZESTRIL) 10 MG tablet Take 1 tablet (10 mg total) by mouth daily.  . [DISCONTINUED] lovastatin (MEVACOR) 40 MG tablet Take 1  tablet (40 mg total) by mouth daily.  . [DISCONTINUED] omeprazole (PRILOSEC) 20 MG capsule TAKE 1 CAPSULE (20 MG TOTAL) BY MOUTH DAILY.   Facility-Administered Encounter Medications as of 11/21/2018  Medication  . albuterol (PROVENTIL) (2.5 MG/3ML) 0.083% nebulizer solution 2.5 mg    Review of Systems  Constitutional: Positive for fatigue. Negative for appetite change and unexpected weight change.  HENT: Positive for congestion. Negative for sinus pressure.   Respiratory: Positive for shortness of breath. Negative for cough and chest tightness.   Cardiovascular: Negative for chest pain and palpitations.       Leg swelling is better.    Gastrointestinal: Negative for abdominal pain, diarrhea, nausea and vomiting.  Genitourinary: Negative for difficulty urinating and dysuria.  Musculoskeletal: Negative for joint swelling and myalgias.  Skin: Negative for color change and rash.  Neurological: Negative for dizziness.       Head pain as outlined.    Psychiatric/Behavioral: Negative for agitation and dysphoric mood.       Objective:    Physical Exam Constitutional:      General: She is not in acute distress.    Appearance: Normal appearance.  HENT:     Nose: Nose normal.     Mouth/Throat:     Pharynx: No oropharyngeal exudate or posterior oropharyngeal erythema.  Neck:     Musculoskeletal: Neck supple. No muscular tenderness.     Thyroid: No thyromegaly.  Cardiovascular:     Rate and Rhythm: Normal rate and regular rhythm.  Pulmonary:     Effort: No respiratory distress.     Breath sounds: Normal breath sounds. No wheezing.  Abdominal:     General: Bowel sounds are normal.     Palpations: Abdomen is soft.     Tenderness: There is no abdominal  tenderness.  Musculoskeletal:        General: No tenderness.     Comments: Lower extremity swelling improved.    Lymphadenopathy:     Cervical: No cervical adenopathy.  Skin:    Findings: No erythema or rash.  Neurological:     Mental Status: She is alert.  Psychiatric:        Mood and Affect: Mood normal.        Behavior: Behavior normal.     BP (!) 142/90   Pulse 63   Temp 98.1 F (36.7 C) (Oral)   Resp 16   Wt 178 lb 3.2 oz (80.8 kg)   SpO2 97%   BMI 29.20 kg/m  Wt Readings from Last 3 Encounters:  11/21/18 178 lb 3.2 oz (80.8 kg)  11/09/18 180 lb 12 oz (82 kg)  10/18/18 183 lb 12.8 oz (83.4 kg)     Lab Results  Component Value Date   WBC 5.2 11/21/2018   HGB 13.3 11/21/2018   HCT 40.3 11/21/2018   PLT 222.0 11/21/2018   GLUCOSE 97 11/21/2018   CHOL 168 07/19/2018   TRIG 166.0 (H) 07/19/2018   HDL 41.70 07/19/2018   LDLDIRECT 75.0 03/24/2017   LDLCALC 93 07/19/2018   ALT 16 11/21/2018   AST 18 11/21/2018   NA 140 11/21/2018   K 4.0 11/21/2018   CL 103 11/21/2018   CREATININE 0.84 11/21/2018   BUN 13 11/21/2018   CO2 31 11/21/2018   TSH 2.990 11/09/2018   INR 1.5 (H) 11/21/2018   HGBA1C 6.1 11/21/2018    Nm Myocar Multi W/spect W/wall Motion / Ef  Addendum Date: 11/18/2018   Pharmacological myocardial perfusion imaging study with no significant  ischemia Small region of fixed perfusion defect in the apical region, possibly secondary to attenuation over correction Normal wall motion, EF estimated at 68% No EKG changes concerning for ischemia at peak stress or in recovery. Low risk scan Signed, Esmond Plants, MD, Ph.D [facility]   Result Date: 11/17/2018  Horizontal ST segment depression ST segment depression was noted during stress in the V5 and V6 leads, beginning at 2 minutes of stress, ending at 3 minutes of stress.  No T wave inversion was noted during stress.        Assessment & Plan:   Problem List Items Addressed This Visit    Anemia -  Primary    Recheck cbc.        Relevant Orders   CBC with Differential/Platelet (Completed)   Atrial fibrillation ([place])    On eliquis now.  Off diltiazem and on metoprolol.  Appears to be in SR.  Seeing cardiology.  Recent stress test per report ok.  Continue f/u with cardiology.       Relevant Medications   lisinopril (PRINIVIL,ZESTRIL) 20 MG tablet   Other Relevant Orders   Protime-INR (Completed)   Cough    Recently treated for increased cough and congestion.  Congestion has improved.  Still with sob with exertion.  Has seen cardiology.  Medications adjusted.  Had cxr in 08/2018.  Given persistent symptoms, discussed obtaining chest CT.        Essential hypertension    Blood pressure remaining elevated.  Increase lisinopril to '20mg'$  q day.  Follow pressures.  Follow metabolic panel.        Relevant Medications   lisinopril (PRINIVIL,ZESTRIL) 20 MG tablet   Other Relevant Orders   Basic metabolic panel (Completed)   Fall    Recent fall.  Hit head.  Given on blood thinner and persistent pain, will obtain CT head.  Pt in agreement.  No other injuries.        GERD    Controlled.        History of breast cancer    Saw Dr Bary Castilla 01/23/18.  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  Recommended f/u in one year.        Relevant Orders   CT Chest Wo Contrast   History of thyroid cancer    S/p XRT on thyroid replacement.  Follow tsh.        Relevant Orders   CT Chest Wo Contrast   Hypercholesterolemia    On lovastatin.  Low cholesterol diet and exercise.  Follow lipid panel and liver function tests.        Relevant Medications   lisinopril (PRINIVIL,ZESTRIL) 20 MG tablet   Other Relevant Orders   Hepatic function panel (Completed)   Hyperglycemia    Low carb diet and exercise.  Follow met b and a1c.        Relevant Orders   Hemoglobin A1c (Completed)   SOB (shortness of breath)    Persistent sob with exertion.  Has been treated recently for URI.  cxr as outlined.  Saw  cardiology.  Medications adjusted.  Negative stress test per report.  Planning for ECHO.  Discussed further w/up given persistent symptoms.  History of breast cancer and thyroid cancer.  Obtain CT chest.        Relevant Orders   CT Chest Wo Contrast   Swelling of lower extremity    Has been evaluated by cardiology and vascular surgery.  On lasix.  Diltiazem was changed to metoprolol.  Wearing  compression hose.  Improved.  Follow.         Other Visit Diagnoses    Post-traumatic headache, not intractable, unspecified chronicity pattern       Relevant Orders   CT Head Wo Contrast (Completed)   Shortness of breath       Relevant Orders   CT Chest Wo Contrast      I spent 40 minutes with the patient and more than 50% of the time was spent in consultation regarding the above.  Time spent discussing current symptoms.  Time spent discussing further w/up, treatment and evaluation.    Einar Pheasant, MD

## 2018-11-22 ENCOUNTER — Telehealth: Payer: Self-pay | Admitting: *Deleted

## 2018-11-22 ENCOUNTER — Telehealth: Payer: Self-pay | Admitting: Cardiovascular Disease

## 2018-11-22 DIAGNOSIS — M6281 Muscle weakness (generalized): Secondary | ICD-10-CM | POA: Diagnosis not present

## 2018-11-22 DIAGNOSIS — R278 Other lack of coordination: Secondary | ICD-10-CM | POA: Diagnosis not present

## 2018-11-22 DIAGNOSIS — R2689 Other abnormalities of gait and mobility: Secondary | ICD-10-CM | POA: Diagnosis not present

## 2018-11-22 NOTE — Telephone Encounter (Signed)
PT Kerri Mills returning call  PT had went to apartment and checked BP It is 166/98 so it has not changed much at all   Patient did go down for lunch so has been active Please call to discuss

## 2018-11-22 NOTE — Telephone Encounter (Signed)
New Message  Walking dog 20 minutes prior arrival  BP pressure within 30 minutes 164/104 HR 65-68 per minuted O2 91-92%. No CP or SOB not symptomatic. Had cardiac testing lately and wanting to report levels due to increased mobility issues.

## 2018-11-22 NOTE — Telephone Encounter (Signed)
Rip Harbour [phone]) is calling to report abnormal BP for patient.  Patient reports she had walked dog and taken stairs prior to BP reading at 11:30. Reading was 164/104 ( checked multiple times) O2 sat-91-92 sitting  P 64-68. No symptoms. Concern with this patient is her BP did not change after 30 minutes. Patient BP was 168/84 yesterday. Patient has appointments 3 days a week and she will be checked again tomorrow.  She has left the patient- but works in the same building.   (FYI: Patient was seen in office yesterday and BP medication was increased)

## 2018-11-22 NOTE — Telephone Encounter (Signed)
Agree with recent titration of lisinopril. Recommend she take this as directed and contact us or PCP if BP remains elevated for further titration.

## 2018-11-22 NOTE — Telephone Encounter (Signed)
Spoke with PT assistant and she said that during her therapy evaluation her BP was 164/104 sitting then it remained elevated during her treatment. She did take her medications this morning. She did walk up and down stairs as well prior to her arrival. PT assistant did report she has not picked up her lisinopril from the store. She is going to go back and reassess patients blood pressure and will let us know if it remains elevated. Encouraged her to please have patient pick up her medication and get it started so that will help with those elevated readings. She will relay this information to the patient and will let us know if this continues to be a issue.

## 2018-11-22 NOTE — Telephone Encounter (Signed)
The lisinopril was just started by cardiology and dose increased by me yesterday.  Too soon to have full affect of medication.  Recommend continuing to follow blood pressure.

## 2018-11-22 NOTE — Telephone Encounter (Signed)
Spoke with PT assistant and reviewed that we would like her to continue monitoring blood pressures and to call us or PCP if they remain elevated. She will relay this information on to the patient.

## 2018-11-22 NOTE — Telephone Encounter (Signed)
This is an Pharmacist, hospital. There is a note to cardiology as well.

## 2018-11-22 NOTE — Telephone Encounter (Signed)
Spoke with PT Assistant and she reports that blood pressures have not really changed but she states that patient is very active and does not slow down. Dr. Nicki Reaper did increase lisinopril to 20 mg but patient has not picked that up yet. Advised that this would help her pressures and if after starting that they remain elevated to please let us know. She verbalized understanding with no further questions at this time.

## 2018-11-23 ENCOUNTER — Other Ambulatory Visit: Payer: Self-pay | Admitting: Internal Medicine

## 2018-11-23 ENCOUNTER — Telehealth: Payer: Self-pay | Admitting: Cardiovascular Disease

## 2018-11-23 DIAGNOSIS — R2689 Other abnormalities of gait and mobility: Secondary | ICD-10-CM | POA: Diagnosis not present

## 2018-11-23 DIAGNOSIS — R278 Other lack of coordination: Secondary | ICD-10-CM | POA: Diagnosis not present

## 2018-11-23 DIAGNOSIS — M6281 Muscle weakness (generalized): Secondary | ICD-10-CM | POA: Diagnosis not present

## 2018-11-23 NOTE — Telephone Encounter (Signed)
I attempted to call the patient. I left a message for her to please call back.

## 2018-11-23 NOTE — Telephone Encounter (Signed)
Follow up  Pt returning nurses call.  Please f/u

## 2018-11-23 NOTE — Telephone Encounter (Signed)
Please call regarding Lisinopril 10 mg, States she saw her PCP and upped to 20 mg. Pt is unsure what dosage she is to be taking

## 2018-11-23 NOTE — Telephone Encounter (Signed)
Spoke with [place] regarding message below. She stated that cardiology agreed and pressure was better this am. Advised to continue to monitor and let us know if problems

## 2018-11-23 NOTE — Telephone Encounter (Signed)
I spoke with the patient. She states she saw Dr. Nicki Reaper on 2/26 and she increased her lisinopril to 20 mg once daily due to her BP rising a bit. She wanted to make sure she was ok to take the lisinopril 10 mg- 2 tablets (20 mg) once daily until they were gone. I advised her this is ok- please take both tablets at 1 time until they are gone, then she will take lisinopril 20 mg once daily.  She also advised she fell out of her bed this week and Dr. Nicki Reaper has ordered a follow up head CT (she is on eliquis) and a chest x-ray.  I advised her this is fine. She will have an echo here on 3/16 and see Dr. Rockey Situ on 3/18.

## 2018-11-25 ENCOUNTER — Encounter: Payer: Self-pay | Admitting: Internal Medicine

## 2018-11-25 DIAGNOSIS — R0602 Shortness of breath: Secondary | ICD-10-CM | POA: Insufficient documentation

## 2018-11-25 NOTE — Assessment & Plan Note (Signed)
Has been evaluated by cardiology and vascular surgery.  On lasix.  Diltiazem was changed to metoprolol.  Wearing compression hose.  Improved.  Follow.

## 2018-11-25 NOTE — Assessment & Plan Note (Signed)
Saw Dr Bary Castilla 01/23/18.  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  Recommended f/u in one year.

## 2018-11-25 NOTE — Assessment & Plan Note (Signed)
Recheck cbc.  

## 2018-11-25 NOTE — Assessment & Plan Note (Signed)
On lovastatin.  Low cholesterol diet and exercise.  Follow lipid panel and liver function tests.   

## 2018-11-25 NOTE — Assessment & Plan Note (Signed)
Low carb diet and exercise.  Follow met b and a1c.   

## 2018-11-25 NOTE — Assessment & Plan Note (Signed)
On eliquis now.  Off diltiazem and on metoprolol.  Appears to be in SR.  Seeing cardiology.  Recent stress test per report ok.  Continue f/u with cardiology.

## 2018-11-25 NOTE — Assessment & Plan Note (Signed)
S/p XRT on thyroid replacement.  Follow tsh.

## 2018-11-25 NOTE — Assessment & Plan Note (Signed)
Blood pressure remaining elevated.  Increase lisinopril to 20mg  q day.  Follow pressures.  Follow metabolic panel.

## 2018-11-25 NOTE — Assessment & Plan Note (Signed)
Persistent sob with exertion.  Has been treated recently for URI.  cxr as outlined.  Saw cardiology.  Medications adjusted.  Negative stress test per report.  Planning for ECHO.  Discussed further w/up given persistent symptoms.  History of breast cancer and thyroid cancer.  Obtain CT chest.

## 2018-11-25 NOTE — Assessment & Plan Note (Signed)
Controlled.  

## 2018-11-25 NOTE — Assessment & Plan Note (Signed)
Recent fall.  Hit head.  Given on blood thinner and persistent pain, will obtain CT head.  Pt in agreement.  No other injuries.

## 2018-11-25 NOTE — Assessment & Plan Note (Signed)
Recently treated for increased cough and congestion.  Congestion has improved.  Still with sob with exertion.  Has seen cardiology.  Medications adjusted.  Had cxr in 08/2018.  Given persistent symptoms, discussed obtaining chest CT.

## 2018-11-26 DIAGNOSIS — R278 Other lack of coordination: Secondary | ICD-10-CM | POA: Diagnosis not present

## 2018-11-26 DIAGNOSIS — M6281 Muscle weakness (generalized): Secondary | ICD-10-CM | POA: Diagnosis not present

## 2018-11-26 DIAGNOSIS — R2689 Other abnormalities of gait and mobility: Secondary | ICD-10-CM | POA: Diagnosis not present

## 2018-11-27 ENCOUNTER — Telehealth: Payer: Self-pay | Admitting: Cardiovascular Disease

## 2018-11-27 DIAGNOSIS — M6281 Muscle weakness (generalized): Secondary | ICD-10-CM | POA: Diagnosis not present

## 2018-11-27 DIAGNOSIS — R278 Other lack of coordination: Secondary | ICD-10-CM | POA: Diagnosis not present

## 2018-11-27 DIAGNOSIS — R2689 Other abnormalities of gait and mobility: Secondary | ICD-10-CM | POA: Diagnosis not present

## 2018-11-27 NOTE — Telephone Encounter (Signed)
Patient calling to check on status  Is aware Dr Rockey Situ is not in office and may not hear back this afternoon

## 2018-11-27 NOTE — Telephone Encounter (Signed)
Cannon Kettle, physical therapist with [facility] calling States that when she arrived patient's heart rate was 46 with BP 136/82 Once patient moved around HR went to 56 but once resting again it went to 48 Wanted to make office aware  If needing to speak with Anderson Malta, please call [phone] or call the patient

## 2018-11-27 NOTE — Telephone Encounter (Signed)
Called the patient after receiving a call from her physical therapist Anderson Malta. Spoke with the patient who states that she is currently asymptomatic. She was able to complete her PT session today. Anderson Malta, PT did tell her that she was concerned that her resting heartrate was 46-48bpm . The patient reports that her HR was checked radially.  She is currently taking all of her medications as prescribed. Her resting HR was rechecked during the call, and the patient reports 50bpm. Advised the patient that I will fwd the update to [facility] and call back if he has any additional recommendations. Patient agrees with the plan and verbalized understanding.

## 2018-11-28 ENCOUNTER — Other Ambulatory Visit: Payer: Self-pay

## 2018-11-28 DIAGNOSIS — Z1231 Encounter for screening mammogram for malignant neoplasm of breast: Secondary | ICD-10-CM

## 2018-11-28 NOTE — Telephone Encounter (Signed)
Kerri Mills- PT. She requested to receive parameters from Dr Rockey Situ concerning heart rate  1. If HR less than certain amount, then what to do with therapy  2. When would he like to be notified She said typically they won't do therapy if HR low in the 40's.  Called patient to let her know we will notify her once Dr Rockey Situ responds.

## 2018-11-29 ENCOUNTER — Telehealth: Payer: Self-pay | Admitting: Cardiovascular Disease

## 2018-11-29 DIAGNOSIS — R2689 Other abnormalities of gait and mobility: Secondary | ICD-10-CM | POA: Diagnosis not present

## 2018-11-29 DIAGNOSIS — M6281 Muscle weakness (generalized): Secondary | ICD-10-CM | POA: Diagnosis not present

## 2018-11-29 DIAGNOSIS — R278 Other lack of coordination: Secondary | ICD-10-CM | POA: Diagnosis not present

## 2018-11-29 NOTE — Telephone Encounter (Signed)
Audiological scientist to report vitals BP 177/101  Pt c/o of Chest Pain: STAT if CP now or developed within 24 hours  1. Are you having CP right now? Lighter symptoms 2. Are you experiencing any other symptoms (ex. SOB, nausea, vomiting, sweating)?  BP 164/102  3. How long have you been experiencing CP? Since 11:15  4. Is your CP continuous or coming and going?  Continual, but lighter   5. Have you taken Nitroglycerin? no ?

## 2018-11-29 NOTE — Telephone Encounter (Signed)
Recent Myoview without evidence of ischemia. Please have her increase her lisinopril to 40 mg daily and check BP 2 hours after taking. If she is continuing to have chest pain, I recommend ED evaluation. Bradycardia precludes excalation of beta blocker.

## 2018-11-29 NOTE — Telephone Encounter (Signed)
Called patient to check in on her. She had just finished a late lunch. I had her sit down for a few minutes and take her BP. Now BP 150/77, HR 59. Says her chest is still sore but that it stays sore ever since "all this happened" in January with her heart. She says she does have a little headache right now.  Advised her to monitor BP/HR about 2 hours after taking morning meds and if she gets a headache throughout the day.  Then to call us with those readings. She agreed. She will let us know if any new symptoms arise.  Routing to provider for review.   Last saw Thurmond Butts 2/14.

## 2018-11-29 NOTE — Telephone Encounter (Signed)
Called and spoke with patient. At 11:17 am - Reports crushing pain across chest and base of neck. At that time BP was 168/101, HR 68. Denies jaw or left arm pain, no nausea, sweating.  Now, her pain level is "3" out of 10 and it is "soreness." Reports she gets occasional headaches.  She's never checked her BP when she's had the headache.  She got up late this morning and didn't take lisinopril or metoprolol until about 11:30 am. She re-checked BP around 12:30 and it was 179/103, HR 67. Advised her to recheck BP in about 1 hour and if it has not decreased then to call and let us know. Meanwhile, I will seek advice from provider.   Pt verbalized understanding to call 911 or go to the emergency room, if he develops any new or worsening symptoms.

## 2018-11-30 DIAGNOSIS — R278 Other lack of coordination: Secondary | ICD-10-CM | POA: Diagnosis not present

## 2018-11-30 DIAGNOSIS — R2689 Other abnormalities of gait and mobility: Secondary | ICD-10-CM | POA: Diagnosis not present

## 2018-11-30 DIAGNOSIS — M6281 Muscle weakness (generalized): Secondary | ICD-10-CM | POA: Diagnosis not present

## 2018-11-30 NOTE — Telephone Encounter (Addendum)
Called to give patient Kerri Mills's recommendation. [email or web address].

## 2018-11-30 NOTE — Telephone Encounter (Signed)
Would confirm her metoprolol dosing Notes indicates she is taking metoprolol succinate 25 daily as of February If that is correct would cut the pill down to 12.5 daily

## 2018-12-03 DIAGNOSIS — R278 Other lack of coordination: Secondary | ICD-10-CM | POA: Diagnosis not present

## 2018-12-03 DIAGNOSIS — M6281 Muscle weakness (generalized): Secondary | ICD-10-CM | POA: Diagnosis not present

## 2018-12-03 DIAGNOSIS — R2689 Other abnormalities of gait and mobility: Secondary | ICD-10-CM | POA: Diagnosis not present

## 2018-12-03 MED ORDER — METOPROLOL SUCCINATE ER 25 MG PO TB24: 12.5000 mg | ORAL_TABLET | Freq: Every day | ORAL | 3 refills | Status: DC

## 2018-12-03 NOTE — Telephone Encounter (Addendum)
Patient reports she "had a better weekend." She was able to take her dog out without any pain. Denies further chest pain or headache (other than a short one yesterday evening). Recent BPs/HRs: 3/6 AM 161/78,  64  PM 106/64,  47 3/7 AM 140/73,  58  PM 159/82,  56 3/8 AM 142/73,  53  PM 164/79,  54  She verbalized undersatnding to decrease metoprolol to 12.5 mg (0.5 tablet) by mouth once a day. Med list updated.  She verbalized understanding to call 911 or go to the ER if she has chest pain/tightness again and associated symptoms.   Duplicate phone calls. Thurmond Butts has advised to increase Lisinopril to 40 mg daily. Seeing Dr Rockey Situ on 3/18. Routing to Dr Rockey Situ to clarify this.

## 2018-12-03 NOTE — Telephone Encounter (Signed)
Spoke with patient this morning in additional phone call. Please see that encounter.

## 2018-12-04 ENCOUNTER — Telehealth: Payer: Self-pay | Admitting: Cardiovascular Disease

## 2018-12-04 DIAGNOSIS — R2689 Other abnormalities of gait and mobility: Secondary | ICD-10-CM | POA: Diagnosis not present

## 2018-12-04 DIAGNOSIS — M6281 Muscle weakness (generalized): Secondary | ICD-10-CM | POA: Diagnosis not present

## 2018-12-04 DIAGNOSIS — R278 Other lack of coordination: Secondary | ICD-10-CM | POA: Diagnosis not present

## 2018-12-04 NOTE — Telephone Encounter (Signed)
Kerri Mills, PT calling Just wanted to make office aware patient's HR is still low HR: 48-50 BP: 130/74 Oxygen: 96% If any questions or concerns, please call Anderson Malta

## 2018-12-05 DIAGNOSIS — M6281 Muscle weakness (generalized): Secondary | ICD-10-CM | POA: Diagnosis not present

## 2018-12-05 DIAGNOSIS — R278 Other lack of coordination: Secondary | ICD-10-CM | POA: Diagnosis not present

## 2018-12-05 DIAGNOSIS — R2689 Other abnormalities of gait and mobility: Secondary | ICD-10-CM | POA: Diagnosis not present

## 2018-12-05 NOTE — Telephone Encounter (Signed)
Kerri Mills with [facility] calling with updated vitals HR with pulse ox was 53, 59 manual, then with pulse ox again 60 BP is 170/82 - which is a little high  For Dr Rockey Situ to review

## 2018-12-05 NOTE — Telephone Encounter (Signed)
Patient last seen by Christell Faith, PA on 11/21/18. To Thurmond Butts to review updated vitals.

## 2018-12-05 NOTE — Telephone Encounter (Signed)
Left a message with [place] at [facility] to call back.

## 2018-12-05 NOTE — Telephone Encounter (Signed)
We can decrease her Toprol-XL to 12.5 daily and increase her lisinopril to 40 mg daily in an effort to allow for slightly higher heart rates with the lisinopril to compensate for blood pressure.

## 2018-12-06 NOTE — Telephone Encounter (Signed)
No answer. Left message with patient to call back.

## 2018-12-06 NOTE — Telephone Encounter (Signed)
Lisinopril 40 ok thx TG

## 2018-12-06 NOTE — Telephone Encounter (Signed)
No answer. Left message with [employer] to call back.

## 2018-12-06 NOTE — Telephone Encounter (Signed)
[facility] also calling for advise on dosage   [phone]

## 2018-12-06 NOTE — Telephone Encounter (Signed)
Patient returning call re med dosage .  Please call tomorrow morning early this is the best time to reach patient .

## 2018-12-07 ENCOUNTER — Ambulatory Visit: Payer: Medicare Other

## 2018-12-07 NOTE — Telephone Encounter (Signed)
There are duplicate encounters for this pt. Please see tel enc dated 12/04/18.

## 2018-12-07 NOTE — Telephone Encounter (Signed)
lmom for the patient to make her aware that Dr.Gollan agrees with the recent medication changes recommended by Merrily Brittle, PA. Pt is to contact the office if any questions or concerns.  Returned call to [employer]. Spoke with Rip Harbour, [place]. She din not go out to the patient's home today because the patient had a scheduled doctors appt. Rip Harbour was calling to f/u on the patient's recent medication changes due to the patient's BP running high and her HR low at their last visit. Estrella Myrtle that Metoprolol has been reduced to 12.5mg  daily, and Lisinopril increased to 40mg  daily. Adv her that the patient is aware of the medication changes.

## 2018-12-10 ENCOUNTER — Ambulatory Visit
Admission: RE | Admit: 2018-12-10 | Discharge: 2018-12-10 | Disposition: A | Discharge status: Home / Self Care | Payer: Medicare Other | Source: Ambulatory Visit | Attending: Internal Medicine | Admitting: Internal Medicine

## 2018-12-10 ENCOUNTER — Other Ambulatory Visit: Payer: Self-pay | Admitting: Internal Medicine

## 2018-12-10 ENCOUNTER — Ambulatory Visit (INDEPENDENT_AMBULATORY_CARE_PROVIDER_SITE_OTHER): Payer: Medicare Other

## 2018-12-10 ENCOUNTER — Other Ambulatory Visit: Payer: Self-pay

## 2018-12-10 DIAGNOSIS — Z85850 Personal history of malignant neoplasm of thyroid: Secondary | ICD-10-CM

## 2018-12-10 DIAGNOSIS — R0602 Shortness of breath: Secondary | ICD-10-CM | POA: Diagnosis not present

## 2018-12-10 DIAGNOSIS — Z853 Personal history of malignant neoplasm of breast: Secondary | ICD-10-CM | POA: Diagnosis not present

## 2018-12-10 DIAGNOSIS — R0609 Other forms of dyspnea: Secondary | ICD-10-CM | POA: Diagnosis not present

## 2018-12-10 DIAGNOSIS — R278 Other lack of coordination: Secondary | ICD-10-CM | POA: Diagnosis not present

## 2018-12-10 DIAGNOSIS — R2689 Other abnormalities of gait and mobility: Secondary | ICD-10-CM | POA: Diagnosis not present

## 2018-12-10 DIAGNOSIS — M6281 Muscle weakness (generalized): Secondary | ICD-10-CM | POA: Diagnosis not present

## 2018-12-10 DIAGNOSIS — J479 Bronchiectasis, uncomplicated: Secondary | ICD-10-CM | POA: Diagnosis not present

## 2018-12-11 ENCOUNTER — Telehealth: Payer: Self-pay | Admitting: *Deleted

## 2018-12-11 DIAGNOSIS — R2689 Other abnormalities of gait and mobility: Secondary | ICD-10-CM | POA: Diagnosis not present

## 2018-12-11 DIAGNOSIS — M6281 Muscle weakness (generalized): Secondary | ICD-10-CM | POA: Diagnosis not present

## 2018-12-11 DIAGNOSIS — R278 Other lack of coordination: Secondary | ICD-10-CM | POA: Diagnosis not present

## 2018-12-11 NOTE — Telephone Encounter (Signed)
No answer. Left message to call back.   

## 2018-12-11 NOTE — Telephone Encounter (Signed)
Attempted to reach patient for Covid prescreen. No answer. Left message to call back.

## 2018-12-11 NOTE — Telephone Encounter (Signed)
Patient returning call.

## 2018-12-11 NOTE — Progress Notes (Addendum)
Cardiology Office Note  Date:  12/12/2018   ID:  Kerri Mills, DOB Oct 09, 1930, MRN [id number]  PCP:  Einar Pheasant, MD   Chief Complaint  Patient presents with  . Other    1 month follow up from [place] and ECHO. Patient states she feels better. Meds reviewed verbally with patient.     HPI:  Kerri Mills is a 85 y.o. female with  persistent atrial fibrillation, on anticoagulation hypertension,  hyperlipidemia,  Demand ischemia Shortness of breath LBBB,  OSA,  breast cancer s/p lumpectomy and XRT,   Hospital admission September 30, 2017 for chest pain Evaluated by cardiology for atrial fibrillation with RVR and chest pain  She presents today for follow-up after recent hospitalization, follow-up of her chest pain and A. Fib.   INTERVAL HISTORY: The patient reports today for follow up.  Numerous phone calls since her last admissions for hypertension and bradycardia  Reports having severe headaches , etiology of the headaches was unclear .  Now resolved Over the phone medication adjustments up on lisinopril and down on metoprolol she states that she has been feeling fine after reducing her metoprolol.   She has chronic cough denies headaches, dizziness, shortness of breath or any other complaints at this time.    She is currently taking lisinopril 20 mg PO, Eliquis 5 mg PO BID and metoprolol succinate 12.5 daily,  propanolol 10 mg PO PRN   Today's Blood pressure 126/70  Lab work reviewed with her in detail Total Chol 168/ LDL 93 HBA1C 6.1 CR 0.84 Glucose 97  OTHER PAST MEDICAL HISTORY REVIEWED BY ME FOR TODAY'S VISIT:  Echo done on 12/10/2018 that showed mild wall thickening and bilateral bronchiectasis   Seen in the hospital 09/30/2017 Nauseous chest pressure short of breath and tremulous.   called EMS and was found to be in atrial fibrillation with rates in the 120s.   admitted at [facility] where she was in atrial fibrillation at 120 bpm.   LBBB was noted on EKG.   INR  the day prior to admission was 3.1.   started on a diltiazem infusion  orthopnea overnight.  Bradycardia on diltiazem and metoprolol  She reports having an ETT many years ago that was negative for ischemia.   PMH:   has a past medical history of Allergy, Anxiety, Arthritis, Clotting disorder ([place]), Colitis, Depression, Diverticulitis (2013), Gastric ulcer, GERD (gastroesophageal reflux disease), Hypercholesterolemia, Hypertension, Infiltrating lobular carcinoma of left breast (2011), Melanoma ([place]) (1997), Melanoma in situ of upper extremity ([place]) (03/19/2011), Persistent atrial fibrillation, Personal history of radiation therapy (2011), Seroma, Sleep apnea, and Thyroid cancer ([place]) (1992).  PSH:    Past Surgical History:  Procedure Laterality Date  . ABDOMINAL HYSTERECTOMY  1973   partial  . BREAST BIOPSY Left 02-13-13   BENIGN BREAST TISSUE WITH CHANGES CONSISTENT WITH FAT NECROSIS  . BREAST BIOPSY Left 01/21/2015   bx done in [facility] 11:00 left 6-8cmfn  . BREAST EXCISIONAL BIOPSY Left 1995   neg  . BREAST EXCISIONAL BIOPSY Left 2011   Breast cancer radiation  . BREAST LUMPECTOMY Left 2011   BREAST CA  . CARDIAC CATHETERIZATION    . CHOLECYSTECTOMY    . COLONOSCOPY  2013  . MELANOMA EXCISION     RT UPPER ARM  . PARTIAL HYSTERECTOMY     bleeding, ovaries in place.    . THYROID SURGERY  1992   FOR THYROID CANCER  . TONSILLECTOMY      Current Outpatient Medications  Medication Sig Dispense Refill  . albuterol (PROAIR HFA) 108 (90 Base) MCG/ACT inhaler Inhale 2 puffs into the lungs every 6 (six) hours as needed for wheezing or shortness of breath. 1 Inhaler 3  . apixaban (ELIQUIS) 5 MG TABS tablet Take 1 tablet (5 mg total) by mouth 2 (two) times daily. 60 tablet 6  . Cholecalciferol (VITAMIN D) 1000 UNITS capsule Take 1,000 Units by mouth daily.      . DULoxetine (CYMBALTA) 60 MG capsule TAKE 1 CAPSULE (60 MG TOTAL) BY MOUTH AT BEDTIME. 90 capsule 1  . ferrous sulfate  325 (65 FE) MG tablet Take 325 mg by mouth every morning.    . furosemide (LASIX) 20 MG tablet Take 1 tablet (20 mg total) by mouth as directed. Take 1 tablet (20 mg) twice a week with potassium 24 tablet 3  . gabapentin (NEURONTIN) 300 MG capsule TAKE 2 CAPSULES (600 MG TOTAL) BY MOUTH AT BEDTIME. 180 capsule 0  . levothyroxine (SYNTHROID, LEVOTHROID) 88 MCG tablet TAKE ONE TABLET BY MOUTH ONCE DAILY BEFORE BREAKFAST 90 tablet 2  . lisinopril (PRINIVIL,ZESTRIL) 20 MG tablet Take 1 tablet (20 mg total) by mouth daily. 30 tablet 1  . lovastatin (MEVACOR) 40 MG tablet TAKE 1 TABLET BY MOUTH EVERY DAY 90 tablet 1  . metoprolol succinate (TOPROL-XL) 25 MG 24 hr tablet Take 0.5 tablets (12.5 mg total) by mouth daily. Take with or immediately following a meal. 90 tablet 3  . Multiple Vitamins-Minerals (PRESERVISION AREDS 2) CAPS Take 1 tablet by mouth 2 (two) times daily.     Marland Kitchen omeprazole (PRILOSEC) 20 MG capsule TAKE 1 CAPSULE (20 MG TOTAL) BY MOUTH DAILY. 90 capsule 3  . potassium chloride (K-DUR) 10 MEQ tablet Take 2 tablets (20 mEq total) by mouth as directed. Take 2 tablets (20 mEq) twice a week with Lasix 48 tablet 3  . propranolol (INDERAL) 10 MG tablet TAKE 1 TABLET BY MOUTH EVERY DAY AS NEEDED 90 tablet 0  . triamcinolone cream (KENALOG) 0.1 % Apply 1 application topically 2 (two) times daily. 30 g 0  . vitamin B-12 (CYANOCOBALAMIN) 1000 MCG tablet Take 1,000 mcg by mouth daily.     Current Facility-Administered Medications  Medication Dose Route Frequency Provider Last Rate Last Dose  . albuterol (PROVENTIL) (2.5 MG/3ML) 0.083% nebulizer solution 2.5 mg  2.5 mg Nebulization Once Guse, Jacquelynn Cree, FNP         Allergies:   Atorvastatin; Lipitor [atorvastatin calcium]; and Penicillins   Social History:  The patient  reports that she has never smoked. She has never used smokeless tobacco. She reports current alcohol use. She reports that she does not use drugs.   Family History:   family  history includes Cancer in her brother and sister; Heart disease in her mother.   Review of Systems  Constitutional: Negative.   Eyes: Negative.   Respiratory: Positive for cough. Negative for shortness of breath.   Cardiovascular: Negative.   Gastrointestinal: Negative.   Genitourinary: Negative.   Musculoskeletal: Negative.   Neurological: Negative.  Negative for dizziness and headaches.  Psychiatric/Behavioral: Negative.   All other systems reviewed and are negative.   PHYSICAL EXAM: VS:  BP 126/70 (BP Location: Left Arm, Patient Position: Sitting, Cuff Size: Normal)   Pulse 62   Ht 5\' 5"  (1.651 m)   Wt 174 lb 12 oz (79.3 kg)   BMI 29.08 kg/m  , BMI Body mass index is 29.08 kg/m.   GEN: Well nourished, well developed, in  no acute distress  HEENT: normal  Neck: no JVD, carotid bruits, or masses Cardiac: RRR; no murmurs, rubs, or gallops,no edema  Respiratory:  clear to auscultation bilaterally, normal work of breathing GI: soft, nontender, nondistended, + BS MS: no deformity or atrophy  Skin: warm and dry, no rash Neuro:  Strength and sensation are intact Psych: euthymic mood, full affect   Recent Labs: 11/09/2018: BNP 362.6; TSH 2.990 11/21/2018: ALT 16; BUN 13; Creatinine, Ser 0.84; Hemoglobin 13.3; Platelets 222.0; Potassium 4.0; Sodium 140    Lipid Panel Lab Results  Component Value Date   CHOL 168 07/19/2018   HDL 41.70 07/19/2018   LDLCALC 93 07/19/2018   TRIG 166.0 (H) 07/19/2018      Wt Readings from Last 3 Encounters:  12/12/18 174 lb 12 oz (79.3 kg)  11/21/18 178 lb 3.2 oz (80.8 kg)  11/09/18 180 lb 12 oz (82 kg)      ASSESSMENT AND PLAN:  Persistent atrial fibrillation ([place]) - Plan: EKG 12-Lead Maintaining normal sinus rhythm Tolerating anticoagulation No changes to her medications  Essential hypertension - Plan: EKG 12-Lead Prior episodes of hypertension possibly associated to headaches Etiology of headaches unclear, now resolved Doing  well on lower dose metoprolol .  Feels some of her side effects were secondary to high-dose metoprolol causing bradycardia  Pure hypercholesterolemia - Plan: EKG 12-Lead Numbers at goal, no medication changes made  Chronic cough Recent echocardiogram results discussed with her showing normal LV function CT scan chest results reviewed with her showing bilateral bronchiectasis Recommend she discuss this with primary care  Long discussion concerning recent hospitalization  Total encounter time more than 25 minutes  Greater than 50% was spent in counseling and coordination of care with the patient   Disposition: F/U  6 months   No orders of the defined types were placed in this encounter.   Yetta Glassman am acting as a Education administrator for Ida Rogue, M.D., Ph.D.  I, Ida Rogue, M.D. Ph.D., have reviewed the above documentation for accuracy and completeness, and I agree with the above.   Signed, Esmond Plants, M.D., Ph.D. 12/12/2018  [place], [place]

## 2018-12-12 ENCOUNTER — Ambulatory Visit (INDEPENDENT_AMBULATORY_CARE_PROVIDER_SITE_OTHER): Payer: Medicare Other | Admitting: Cardiovascular Disease

## 2018-12-12 ENCOUNTER — Other Ambulatory Visit: Payer: Self-pay

## 2018-12-12 ENCOUNTER — Encounter: Payer: Self-pay | Admitting: Cardiovascular Disease

## 2018-12-12 VITALS — BP 126/70 | HR 62 | Ht 65.0 in | Wt 174.8 lb

## 2018-12-12 DIAGNOSIS — R079 Chest pain, unspecified: Secondary | ICD-10-CM | POA: Diagnosis not present

## 2018-12-12 DIAGNOSIS — I4819 Other persistent atrial fibrillation: Secondary | ICD-10-CM | POA: Diagnosis not present

## 2018-12-12 DIAGNOSIS — R2689 Other abnormalities of gait and mobility: Secondary | ICD-10-CM | POA: Diagnosis not present

## 2018-12-12 DIAGNOSIS — M6281 Muscle weakness (generalized): Secondary | ICD-10-CM | POA: Diagnosis not present

## 2018-12-12 DIAGNOSIS — R0609 Other forms of dyspnea: Secondary | ICD-10-CM | POA: Diagnosis not present

## 2018-12-12 DIAGNOSIS — E7800 Pure hypercholesterolemia, unspecified: Secondary | ICD-10-CM | POA: Diagnosis not present

## 2018-12-12 DIAGNOSIS — R278 Other lack of coordination: Secondary | ICD-10-CM | POA: Diagnosis not present

## 2018-12-12 DIAGNOSIS — I10 Essential (primary) hypertension: Secondary | ICD-10-CM | POA: Diagnosis not present

## 2018-12-12 NOTE — Telephone Encounter (Signed)
Patient seen in clinic today

## 2018-12-12 NOTE — Patient Instructions (Addendum)
Medication Instructions:  Ask Dr. Nicki Reaper about zyrtec/cetirazine 1/2 pill a day  If you need a refill on your cardiac medications before your next appointment, please call your pharmacy.    Lab work: No new labs needed   If you have labs (blood work) drawn today and your tests are completely normal, you will receive your results only by: Marland Kitchen MyChart Message (if you have MyChart) OR . A paper copy in the mail If you have any lab test that is abnormal or we need to change your treatment, we will call you to review the results.   Testing/Procedures: No new testing needed   Follow-Up: At [facility], you and your health needs are our priority.  As part of our continuing mission to provide you with exceptional heart care, we have created designated Provider Care Teams.  These Care Teams include your primary Cardiologist (physician) and Advanced Practice Providers (APPs -  Physician Assistants and Nurse Practitioners) who all work together to provide you with the care you need, when you need it.  . You will need a follow up appointment in 6 months .   Please call our office 2 months in advance to schedule this appointment.    . Providers on your designated Care Team:   . Murray Hodgkins, NP . Christell Faith, PA-C . Marrianne Mood, PA-C  Any Other Special Instructions Will Be Listed Below (If Applicable).  For educational health videos Log in to : [email or web address] Or : [email or web address], password : [email or web address]

## 2018-12-13 ENCOUNTER — Telehealth: Payer: Self-pay | Admitting: Cardiovascular Disease

## 2018-12-13 ENCOUNTER — Other Ambulatory Visit: Payer: Self-pay | Admitting: Internal Medicine

## 2018-12-13 DIAGNOSIS — R059 Cough, unspecified: Secondary | ICD-10-CM

## 2018-12-13 DIAGNOSIS — Z853 Personal history of malignant neoplasm of breast: Secondary | ICD-10-CM

## 2018-12-13 DIAGNOSIS — R911 Solitary pulmonary nodule: Secondary | ICD-10-CM

## 2018-12-13 DIAGNOSIS — R9389 Abnormal findings on diagnostic imaging of other specified body structures: Secondary | ICD-10-CM

## 2018-12-13 DIAGNOSIS — R0602 Shortness of breath: Secondary | ICD-10-CM

## 2018-12-13 DIAGNOSIS — R05 Cough: Secondary | ICD-10-CM

## 2018-12-13 NOTE — Progress Notes (Signed)
Order placed for pulmonary referral.  Order placed for diagnostic mammogram.

## 2018-12-13 NOTE — Telephone Encounter (Signed)
Calling to report vitals for patient yesterday BP 98/54, no symptoms. Went for a walk 130/66. After sitting for 5 minutes 94/58. Please call to discuss. HR was 60 the entire time.

## 2018-12-13 NOTE — Telephone Encounter (Signed)
[place] with physical therapy said patient did not feel dizzy. Just some headache and fatigue from walking. Concern for dehydration.  Advised to stay well hydrated, take it slowly when going from lying, standing or sitting. Also suggested patient could wear compression stockings. Will route to Dr Rockey Situ for review and any further advice.

## 2018-12-14 ENCOUNTER — Other Ambulatory Visit: Payer: Self-pay | Admitting: Internal Medicine

## 2018-12-15 NOTE — Telephone Encounter (Signed)
If blood pressure running low, need to adjust meds Would decrease the lisinopril down to 10 mg daily

## 2018-12-17 DIAGNOSIS — R2689 Other abnormalities of gait and mobility: Secondary | ICD-10-CM | POA: Diagnosis not present

## 2018-12-17 DIAGNOSIS — M6281 Muscle weakness (generalized): Secondary | ICD-10-CM | POA: Diagnosis not present

## 2018-12-17 DIAGNOSIS — R278 Other lack of coordination: Secondary | ICD-10-CM | POA: Diagnosis not present

## 2018-12-17 IMAGING — DX DG CHEST 2V
2 series · 2 of 2 positions shown · non-contrast
Comparison: Portable chest x-ray September 29, 2017

CLINICAL DATA: Cough, chest congestion, and low-grade fever began 2
weeks ago. History of breast malignancy, atrial fibrillation,
gastroesophageal reflux.

EXAM:
CHEST - 2 VIEW

[chest pa]
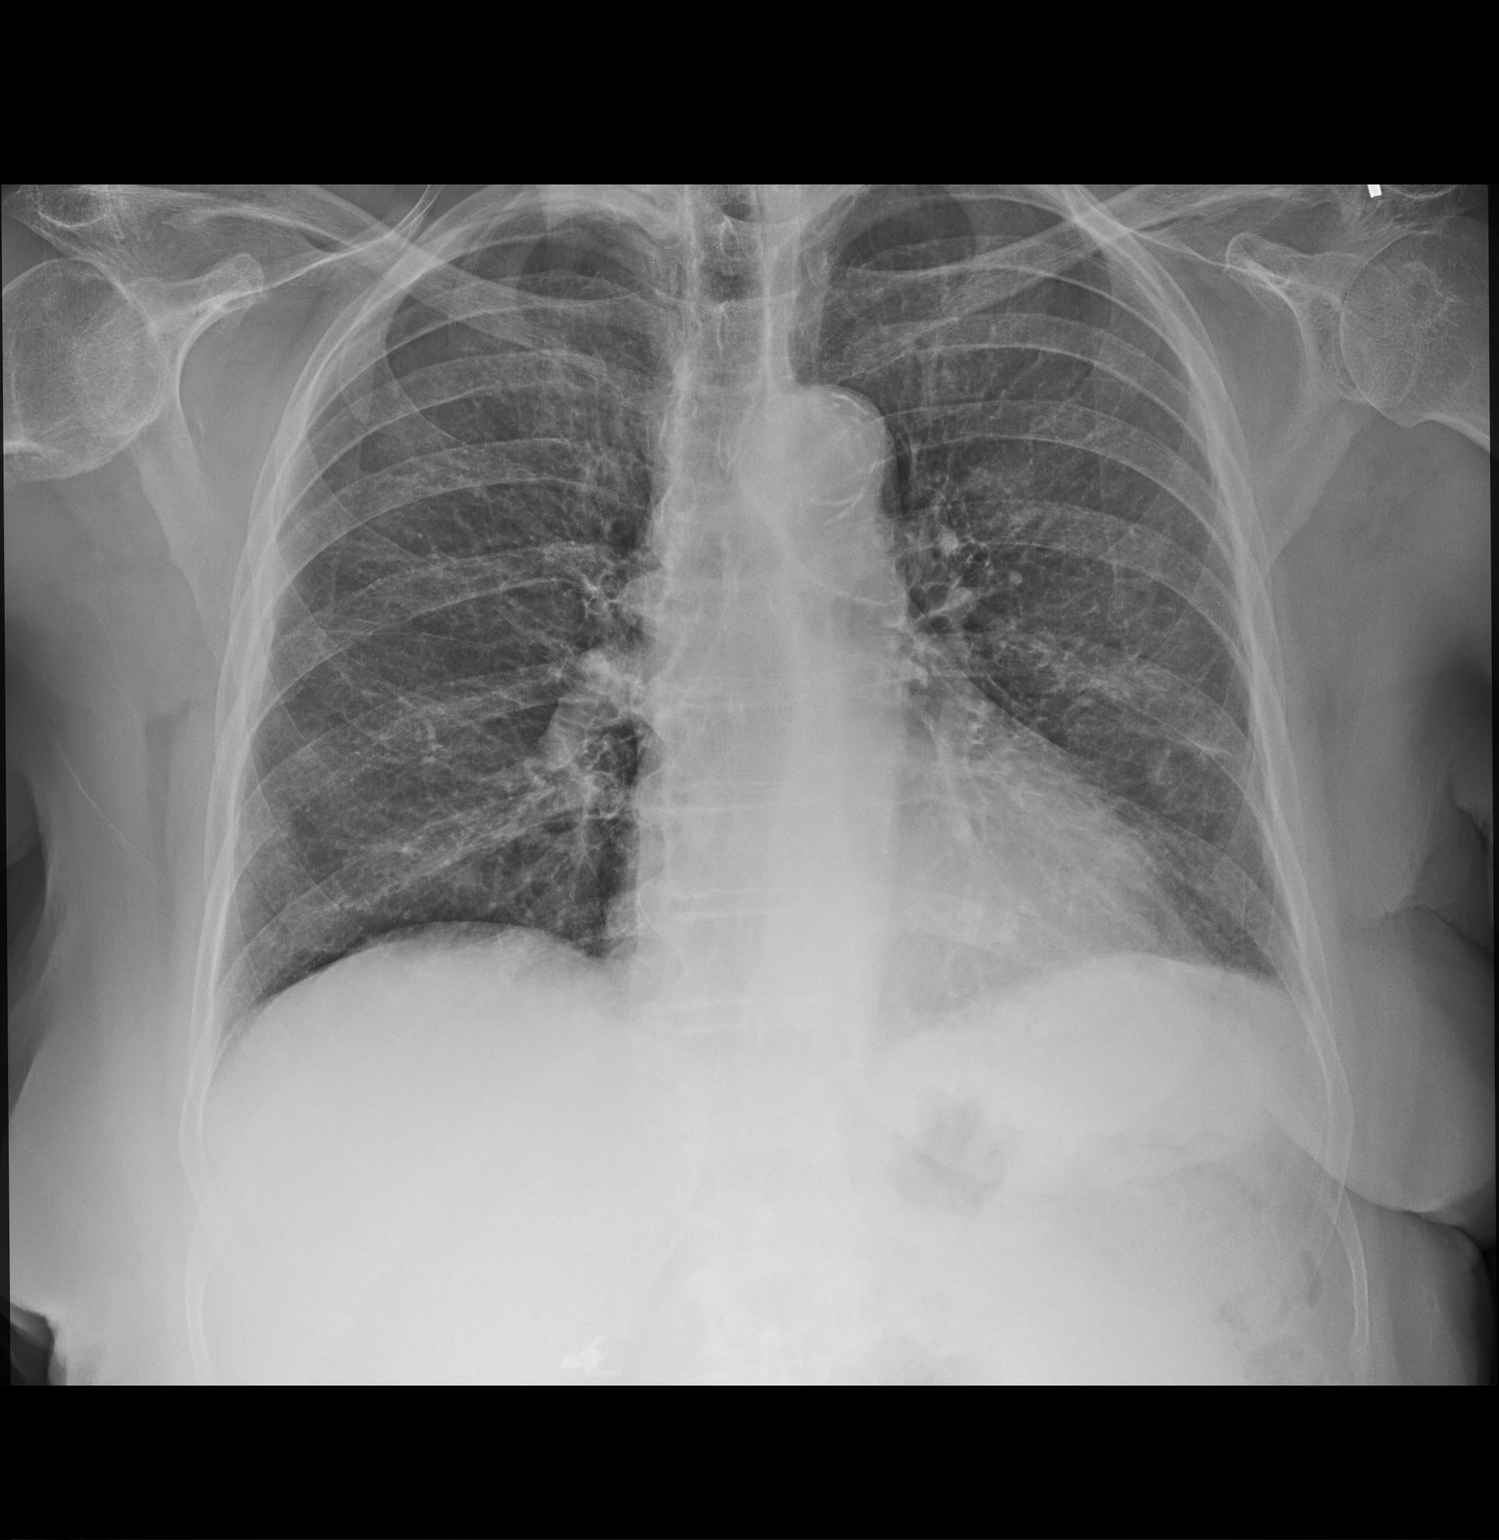

[chest lat]
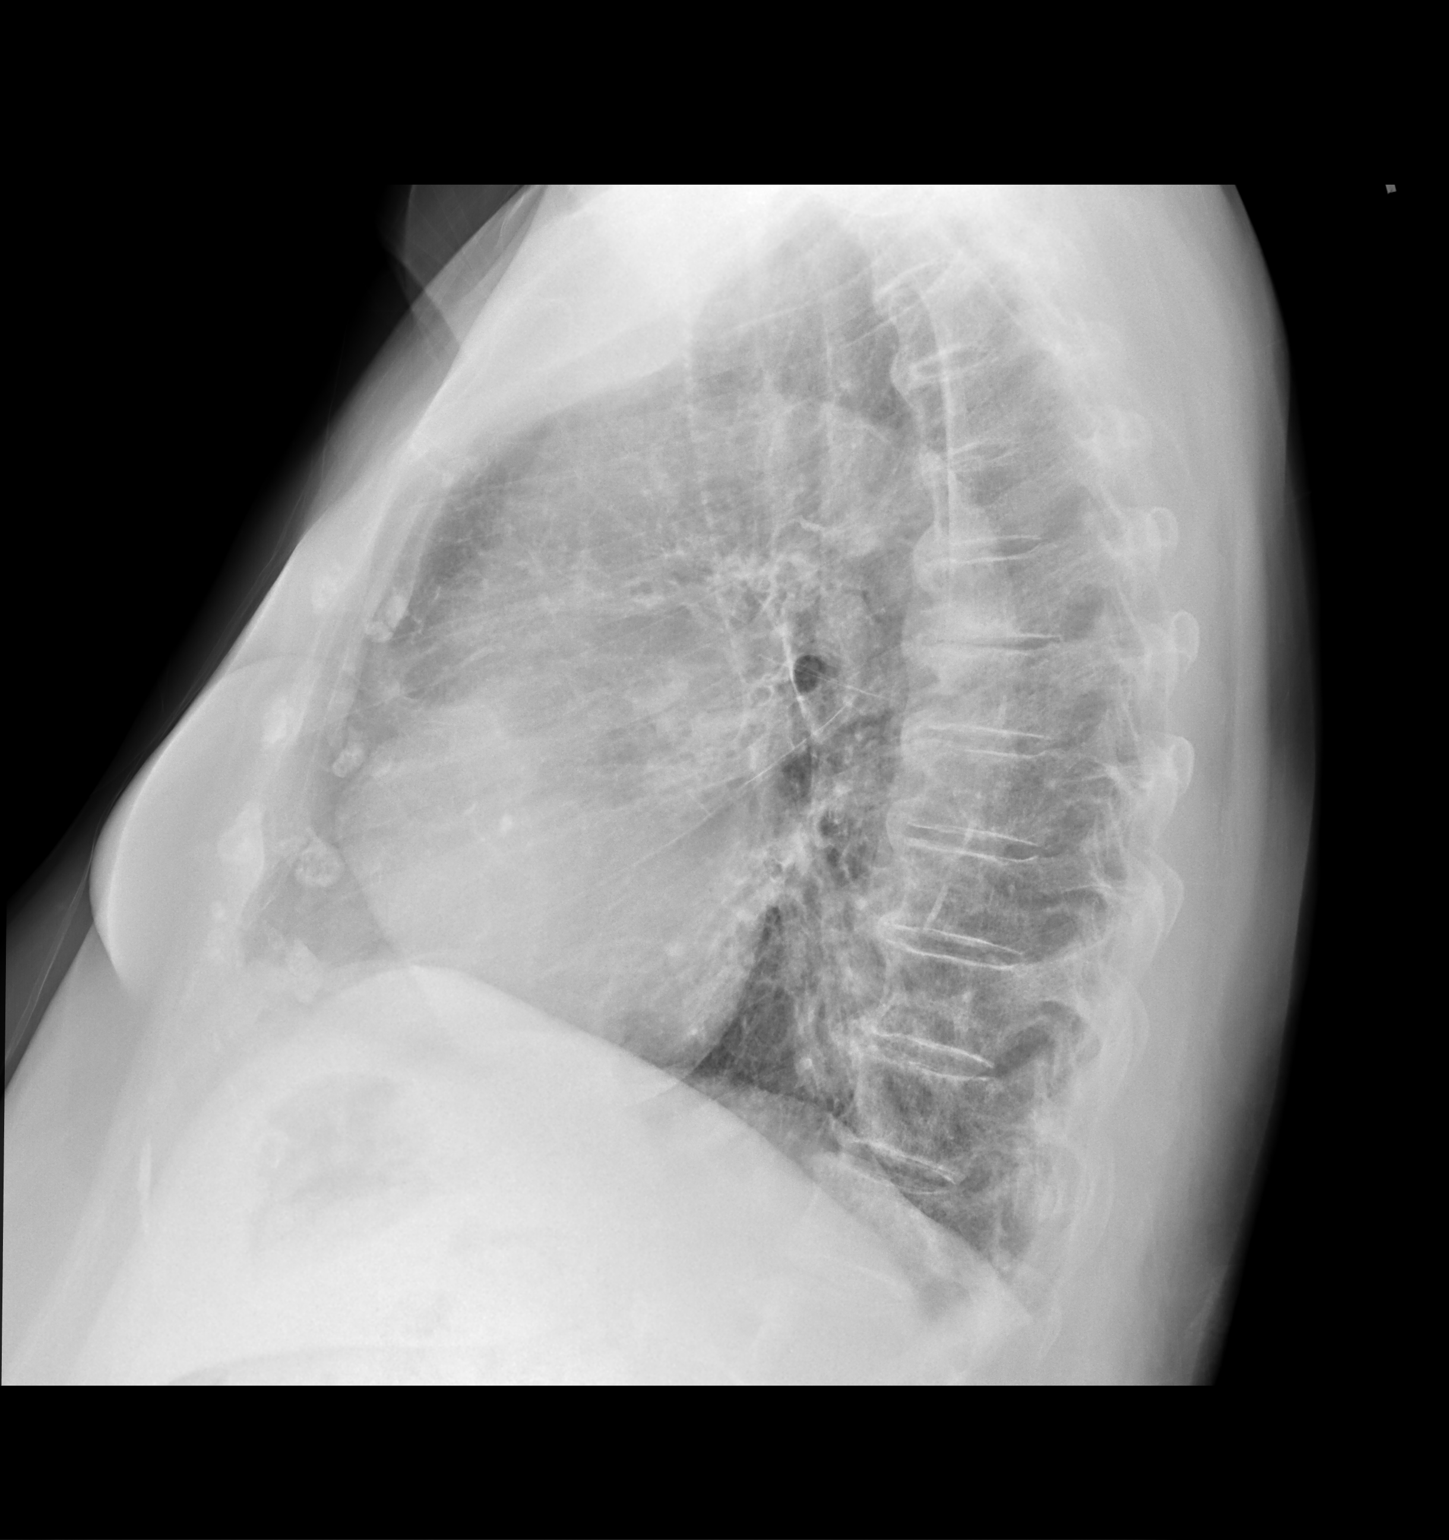

[2 of 2 positions shown; findings below may reference images not displayed]

FINDINGS: The lungs are adequately inflated. The interstitial markings are
increased. The heart is top-normal in size. The pulmonary
vascularity is normal. There's calcification in the wall of the
aortic arch and descending thoracic aorta. There is calcification of
portions of the anterior longitudinal ligament of the thoracic
spine.
IMPRESSION: Findings compatible with acute on chronic bronchitis. No alveolar
pneumonia nor CHF.

Thoracic aortic atherosclerosis.  Six

## 2018-12-17 NOTE — Telephone Encounter (Signed)
I spoke with the patient. She is advised that Dr. Rockey Situ reviewed her BP readings from last week.   The patient confirms she has had no more BP readings in the 90's since 3/19. Her BP has been 120-130/60's.  She feels her body has adjusted to her current medications. I advised that Dr. Rockey Situ recommended that we could decrease lisinopril to 10 mg once daily if BP readings were still low. However, I have advised his SBP readings are 120-130's, we can leave her medications as they are for right now. She is also advised to continue to monitor her BP readings and if she sees SBP readings are back in the 90's, please call and we can decrease lisinopril to 10 mg once daily at that time per Dr. Rockey Situ.  The patient voices understanding and is agreeable.

## 2018-12-18 ENCOUNTER — Other Ambulatory Visit: Payer: Self-pay

## 2018-12-18 ENCOUNTER — Telehealth: Payer: Self-pay

## 2018-12-18 ENCOUNTER — Ambulatory Visit
Admission: RE | Admit: 2018-12-18 | Discharge: 2018-12-18 | Disposition: A | Discharge status: Home / Self Care | Payer: Medicare Other | Source: Ambulatory Visit | Attending: Internal Medicine | Admitting: Internal Medicine

## 2018-12-18 DIAGNOSIS — R9389 Abnormal findings on diagnostic imaging of other specified body structures: Secondary | ICD-10-CM | POA: Insufficient documentation

## 2018-12-18 DIAGNOSIS — Z853 Personal history of malignant neoplasm of breast: Secondary | ICD-10-CM | POA: Diagnosis not present

## 2018-12-18 DIAGNOSIS — M6281 Muscle weakness (generalized): Secondary | ICD-10-CM | POA: Diagnosis not present

## 2018-12-18 DIAGNOSIS — R922 Inconclusive mammogram: Secondary | ICD-10-CM | POA: Diagnosis not present

## 2018-12-18 DIAGNOSIS — R278 Other lack of coordination: Secondary | ICD-10-CM | POA: Diagnosis not present

## 2018-12-18 DIAGNOSIS — R2689 Other abnormalities of gait and mobility: Secondary | ICD-10-CM | POA: Diagnosis not present

## 2018-12-18 NOTE — Telephone Encounter (Signed)
Copied from [place] [id number]. Topic: Quick Communication - Appointment Cancellation >> Dec 18, 2018  1:41 PM Margot Ables wrote: Patient called to cancel appointment scheduled for 3/25 12:30pm with Dr. Nicki Reaper. Patient has not rescheduled their appointment.  pt lives at [facility] and they are now lockdown effective today - pt not able to come to appt Pt would like to do telephone visit if possible with Dr. Nicki Reaper.   Route to department's PEC pool.

## 2018-12-19 ENCOUNTER — Ambulatory Visit: Payer: Medicare Other | Admitting: Internal Medicine

## 2018-12-19 DIAGNOSIS — M6281 Muscle weakness (generalized): Secondary | ICD-10-CM | POA: Diagnosis not present

## 2018-12-19 DIAGNOSIS — R2689 Other abnormalities of gait and mobility: Secondary | ICD-10-CM | POA: Diagnosis not present

## 2018-12-19 DIAGNOSIS — R278 Other lack of coordination: Secondary | ICD-10-CM | POA: Diagnosis not present

## 2018-12-19 NOTE — Telephone Encounter (Signed)
Sch for virtual visit at 1:30 tomorrow. Pt is aware to be by her phone at 1:30 so Dr. Nicki Reaper can call her

## 2018-12-20 ENCOUNTER — Ambulatory Visit (INDEPENDENT_AMBULATORY_CARE_PROVIDER_SITE_OTHER): Payer: Medicare Other | Admitting: Internal Medicine

## 2018-12-20 ENCOUNTER — Encounter: Payer: Self-pay | Admitting: Internal Medicine

## 2018-12-20 DIAGNOSIS — E7800 Pure hypercholesterolemia, unspecified: Secondary | ICD-10-CM | POA: Diagnosis not present

## 2018-12-20 DIAGNOSIS — I10 Essential (primary) hypertension: Secondary | ICD-10-CM | POA: Diagnosis not present

## 2018-12-20 DIAGNOSIS — I4819 Other persistent atrial fibrillation: Secondary | ICD-10-CM

## 2018-12-20 DIAGNOSIS — D649 Anemia, unspecified: Secondary | ICD-10-CM

## 2018-12-20 DIAGNOSIS — R05 Cough: Secondary | ICD-10-CM

## 2018-12-20 DIAGNOSIS — R059 Cough, unspecified: Secondary | ICD-10-CM

## 2018-12-20 DIAGNOSIS — K219 Gastro-esophageal reflux disease without esophagitis: Secondary | ICD-10-CM

## 2018-12-20 DIAGNOSIS — R9389 Abnormal findings on diagnostic imaging of other specified body structures: Secondary | ICD-10-CM

## 2018-12-20 NOTE — Progress Notes (Addendum)
Virtual Visit via Video Note  I connected with Kerri Mills  on 12/20/18 at  1:30 PM EDT by telephone and verified that I am speaking with the correct person using two identifiers.  Location patient: home Location provider:work Persons participating in the telephone visit: patient, provider  I discussed the limitations of evaluation and management by telephone.  The visit type was conducted due to national recommendations for restrictions regarding the COVID-19 pandemic.  The patient expressed understanding and agreed to proceed.   HPI: She was scheduled for follow up.  Recently evaluated for bradycardia and elevated blood pressure.  Her medication has been adjusted.  On lower dose metoprolol and taking lisinopril 20mg  q day.  Blood pressure and heart rate doing better - per her report.  States blood pressure checks:  128/60s and 130/68.  Headaches have resolved.  Recently had chest CT for persistent cough and sob.  CT with changes that appear to be c/w bronchiectasis and pulmonary nodules.  Has appt with pulmonary.  States breathing is better.  No increased sob.  Still with some dry cough.  No chest pain.  No acid reflux.  No abdominal pain.  Bowels moving.     ROS: See pertinent positives and negatives per HPI.  Past Medical History:  Diagnosis Date  . Allergy   . Anxiety   . Arthritis   . Clotting disorder ([place])   . Colitis   . Depression   . Diverticulitis 2013  . Gastric ulcer   . GERD (gastroesophageal reflux disease)   . Hypercholesterolemia   . Hypertension   . Infiltrating lobular carcinoma of left breast 2011   T2,N0, ER: 90%; PR 0%; Her 2 neu not amplified. [facility]).  . Melanoma ([place]) 1997  . Melanoma in situ of upper extremity ([place]) 03/19/2011  . Persistent atrial fibrillation    a. CHADS2VASc => 4 (HTN, age x 2, female)  . Personal history of radiation therapy 2011   BREAST CA  . Seroma    HISTORY OF LFT BREAST  . Sleep apnea   . Thyroid cancer ([place]) 1992     Past Surgical History:  Procedure Laterality Date  . ABDOMINAL HYSTERECTOMY  1973   partial  . BREAST BIOPSY Left 02-13-13   BENIGN BREAST TISSUE WITH CHANGES CONSISTENT WITH FAT NECROSIS  . BREAST BIOPSY Left 01/21/2015   bx done in [facility] 11:00 left 6-8cmfn  . BREAST EXCISIONAL BIOPSY Left 1995   neg  . BREAST EXCISIONAL BIOPSY Left 2011   Breast cancer radiation  . BREAST LUMPECTOMY Left 2011   BREAST CA  . CARDIAC CATHETERIZATION    . CHOLECYSTECTOMY    . COLONOSCOPY  2013  . MELANOMA EXCISION     RT UPPER ARM  . PARTIAL HYSTERECTOMY     bleeding, ovaries in place.    . THYROID SURGERY  1992   FOR THYROID CANCER  . TONSILLECTOMY      Family History  Problem Relation Age of Onset  . Heart disease Mother   . Cancer Brother        lung   . Cancer Sister        breast  . Breast cancer Neg Hx     SOCIAL HX: reviewed.    Current Outpatient Medications:  .  albuterol (PROAIR HFA) 108 (90 Base) MCG/ACT inhaler, Inhale 2 puffs into the lungs every 6 (six) hours as needed for wheezing or shortness of breath., Disp: 1 Inhaler, Rfl: 3 .  apixaban (  ELIQUIS) 5 MG TABS tablet, Take 1 tablet (5 mg total) by mouth 2 (two) times daily., Disp: 60 tablet, Rfl: 6 .  Cholecalciferol (VITAMIN D) 1000 UNITS capsule, Take 1,000 Units by mouth daily.  , Disp: , Rfl:  .  DULoxetine (CYMBALTA) 60 MG capsule, TAKE 1 CAPSULE (60 MG TOTAL) BY MOUTH AT BEDTIME., Disp: 90 capsule, Rfl: 1 .  ferrous sulfate 325 (65 FE) MG tablet, Take 325 mg by mouth every morning., Disp: , Rfl:  .  furosemide (LASIX) 20 MG tablet, Take 1 tablet (20 mg total) by mouth as directed. Take 1 tablet (20 mg) twice a week with potassium, Disp: 24 tablet, Rfl: 3 .  gabapentin (NEURONTIN) 300 MG capsule, TAKE 2 CAPSULES (600 MG TOTAL) BY MOUTH AT BEDTIME., Disp: 180 capsule, Rfl: 0 .  levothyroxine (SYNTHROID, LEVOTHROID) 88 MCG tablet, TAKE ONE TABLET BY MOUTH ONCE DAILY BEFORE BREAKFAST, Disp: 90 tablet, Rfl:  2 .  lisinopril (PRINIVIL,ZESTRIL) 20 MG tablet, TAKE 1 TABLET BY MOUTH EVERY DAY, Disp: 30 tablet, Rfl: 1 .  lovastatin (MEVACOR) 40 MG tablet, TAKE 1 TABLET BY MOUTH EVERY DAY, Disp: 90 tablet, Rfl: 1 .  metoprolol succinate (TOPROL-XL) 25 MG 24 hr tablet, Take 0.5 tablets (12.5 mg total) by mouth daily. Take with or immediately following a meal., Disp: 90 tablet, Rfl: 3 .  Multiple Vitamins-Minerals (PRESERVISION AREDS 2) CAPS, Take 1 tablet by mouth 2 (two) times daily. , Disp: , Rfl:  .  omeprazole (PRILOSEC) 20 MG capsule, TAKE 1 CAPSULE (20 MG TOTAL) BY MOUTH DAILY., Disp: 90 capsule, Rfl: 3 .  potassium chloride (K-DUR) 10 MEQ tablet, Take 2 tablets (20 mEq total) by mouth as directed. Take 2 tablets (20 mEq) twice a week with Lasix, Disp: 48 tablet, Rfl: 3 .  propranolol (INDERAL) 10 MG tablet, TAKE 1 TABLET BY MOUTH EVERY DAY AS NEEDED, Disp: 90 tablet, Rfl: 0 .  triamcinolone cream (KENALOG) 0.1 %, Apply 1 application topically 2 (two) times daily., Disp: 30 g, Rfl: 0 .  vitamin B-12 (CYANOCOBALAMIN) 1000 MCG tablet, Take 1,000 mcg by mouth daily., Disp: , Rfl:   Current Facility-Administered Medications:  .  albuterol (PROVENTIL) (2.5 MG/3ML) 0.083% nebulizer solution 2.5 mg, 2.5 mg, Nebulization, Once, Guse, Lauren M, FNP  EXAM:  VITALS per patient if applicable: blood pressure checks (outside checks) - 128/60s and 130/68  GENERAL: alert, oriented.     ASSESSMENT AND PLAN:  Discussed the following assessment and plan:  Anemia, unspecified type  Persistent atrial fibrillation  Essential hypertension  Gastroesophageal reflux disease, esophagitis presence not specified  Hypercholesterolemia  Cough  Abnormal chest CT     I discussed the assessment and treatment plan with the patient. The patient was provided an opportunity to ask questions and all were answered. The patient agreed with the plan and demonstrated an understanding of the instructions.   The patient  was advised to call back or seek an in-person evaluation if the symptoms worsen or if the condition fails to improve as anticipated.  I provided 20 minutes of non-face-to-face time during this encounter.   Einar Pheasant, MD

## 2018-12-22 ENCOUNTER — Encounter: Payer: Self-pay | Admitting: Internal Medicine

## 2018-12-22 DIAGNOSIS — R9389 Abnormal findings on diagnostic imaging of other specified body structures: Secondary | ICD-10-CM | POA: Insufficient documentation

## 2018-12-22 NOTE — Assessment & Plan Note (Signed)
Blood pressure as outlined.  Appears to be doing better.  Follow.

## 2018-12-22 NOTE — Assessment & Plan Note (Signed)
Controlled on current regimen.   

## 2018-12-22 NOTE — Assessment & Plan Note (Signed)
On eliquis.  Metoprolol has been lowered to 25mg  q day.  Seeing cardiology.  Felt stable.  Recommended f/u in 6 months.

## 2018-12-22 NOTE — Assessment & Plan Note (Signed)
Follow cbc.  

## 2018-12-22 NOTE — Assessment & Plan Note (Signed)
Changes that appear to be c/w bronchiectasis.  Keep f/u with pulmonary.  Breast changes noted.  Had f/u mammogram.  Felt to be related to scaring and previous surgeries.  Recommended f/u mammogram in one year.

## 2018-12-22 NOTE — Assessment & Plan Note (Signed)
On lovastatin.  Low cholesterol diet and exercise.  Follow lipid panel and liver function tests.   

## 2018-12-22 NOTE — Assessment & Plan Note (Signed)
SOB better.  Still with some cough. CT with changes that appear to be c/w bronchiectasis.  Has appt with pulmonary.  Some minimal persistent cough.  Discussed ace inhibitor cough.  She desires not to change her blood pressure medication.  States cough started prior to starting lisinopril.  Is better.  Keep appt with pulmonary.

## 2018-12-24 DIAGNOSIS — R2689 Other abnormalities of gait and mobility: Secondary | ICD-10-CM | POA: Diagnosis not present

## 2018-12-24 DIAGNOSIS — R278 Other lack of coordination: Secondary | ICD-10-CM | POA: Diagnosis not present

## 2018-12-24 DIAGNOSIS — M6281 Muscle weakness (generalized): Secondary | ICD-10-CM | POA: Diagnosis not present

## 2018-12-27 ENCOUNTER — Telehealth: Payer: Self-pay | Admitting: Cardiovascular Disease

## 2018-12-27 DIAGNOSIS — R2689 Other abnormalities of gait and mobility: Secondary | ICD-10-CM | POA: Diagnosis not present

## 2018-12-27 DIAGNOSIS — R278 Other lack of coordination: Secondary | ICD-10-CM | POA: Diagnosis not present

## 2018-12-27 DIAGNOSIS — M6281 Muscle weakness (generalized): Secondary | ICD-10-CM | POA: Diagnosis not present

## 2018-12-27 NOTE — Telephone Encounter (Signed)
Would stop metoprolol and propanolol for now Perhaps at a later date we can restart propranolol if heartrate improves

## 2018-12-27 NOTE — Telephone Encounter (Signed)
Left voicemail message for Kerri Mills to call back.

## 2018-12-27 NOTE — Telephone Encounter (Signed)
Call transferred directly to triage. I spoke with Rip Harbour, Therapist, sports for [facility].   She called to report:  1) the patient has been having HR's in the 96's - Tuesday she was 43-45 at rest and 63 with ambulation - today she is 49 bpm and feels more SOB than usual and a little more lightheaded than normal - BP 112/64 - the patient reported feeling "funny" this morning, like she could pass out.   2) she is asking if a monitor is appropriate for the patient - she lives at independent living at [facility] and would have help applying this is needed - confirmed the patient is taking: Metoprolol succinate 12.5 mg daily & Propranolol 10 mg daily (this is more for tremors) - last dose of lasix was last Tuesday  3) Rip Harbour is requesting parameters for the patient's BP/ HR  I advised I will need to review with Dr. Rockey Situ as I am not sure if he would like to adjust 1 of the 2 beta blockers she is taking. Rip Harbour asked that the patient be called back directly. Primary # was confirmed in the patient's chart.   To Dr. Rockey Situ to review.

## 2018-12-27 NOTE — Telephone Encounter (Signed)
STAT if HR is under 50 or over 120 (normal HR is 60-100 beats per minute)  1) What is your heart rate? 40's   2) Do you have a log of your heart rate readings (document readings)?  Range per hh is 40's -60's   3) Do you have any other symptoms?  Lightheaded SOB   Please call [place] for Parameters on BP and HR

## 2018-12-27 NOTE — Telephone Encounter (Signed)
[place] returned my call and states that she spoke with Nira Conn and awaiting provider review and recommendations.

## 2018-12-28 DIAGNOSIS — M6281 Muscle weakness (generalized): Secondary | ICD-10-CM | POA: Diagnosis not present

## 2018-12-28 DIAGNOSIS — R2689 Other abnormalities of gait and mobility: Secondary | ICD-10-CM | POA: Diagnosis not present

## 2018-12-28 DIAGNOSIS — R278 Other lack of coordination: Secondary | ICD-10-CM | POA: Diagnosis not present

## 2018-12-28 NOTE — Telephone Encounter (Signed)
Spoke with the patient and the patient's [facility] nurse  Mc[place]ley<RN. They are aware of Dr.Gollans recommendation to stop Propanol and Metoprolol for now.  Pt and [place] nurse verbalized understanding and voiced appreciation for the call.

## 2018-12-31 DIAGNOSIS — M6281 Muscle weakness (generalized): Secondary | ICD-10-CM | POA: Diagnosis not present

## 2018-12-31 DIAGNOSIS — R2689 Other abnormalities of gait and mobility: Secondary | ICD-10-CM | POA: Diagnosis not present

## 2018-12-31 DIAGNOSIS — R278 Other lack of coordination: Secondary | ICD-10-CM | POA: Diagnosis not present

## 2019-01-02 DIAGNOSIS — M6281 Muscle weakness (generalized): Secondary | ICD-10-CM | POA: Diagnosis not present

## 2019-01-02 DIAGNOSIS — R278 Other lack of coordination: Secondary | ICD-10-CM | POA: Diagnosis not present

## 2019-01-02 DIAGNOSIS — R2689 Other abnormalities of gait and mobility: Secondary | ICD-10-CM | POA: Diagnosis not present

## 2019-01-03 ENCOUNTER — Telehealth: Payer: Self-pay | Admitting: Internal Medicine

## 2019-01-03 DIAGNOSIS — R278 Other lack of coordination: Secondary | ICD-10-CM | POA: Diagnosis not present

## 2019-01-03 DIAGNOSIS — R2689 Other abnormalities of gait and mobility: Secondary | ICD-10-CM | POA: Diagnosis not present

## 2019-01-03 DIAGNOSIS — M6281 Muscle weakness (generalized): Secondary | ICD-10-CM | POA: Diagnosis not present

## 2019-01-03 NOTE — Telephone Encounter (Signed)
Called and spoke to pt regarding 01/08/19 appt. Pt wished to reschedule consult vs doing virtual visit, due to covid-19 concerns.  appt has been rescheduled for 03/15/19. Pt aware to contact our office for sooner appt if she develops any new or worsen sx.  Nothing further is needed.

## 2019-01-07 DIAGNOSIS — R2689 Other abnormalities of gait and mobility: Secondary | ICD-10-CM | POA: Diagnosis not present

## 2019-01-07 DIAGNOSIS — M6281 Muscle weakness (generalized): Secondary | ICD-10-CM | POA: Diagnosis not present

## 2019-01-07 DIAGNOSIS — R278 Other lack of coordination: Secondary | ICD-10-CM | POA: Diagnosis not present

## 2019-01-08 ENCOUNTER — Institutional Professional Consult (permissible substitution): Payer: Medicare Other | Admitting: Internal Medicine

## 2019-01-08 ENCOUNTER — Telehealth: Payer: Self-pay

## 2019-01-08 NOTE — Telephone Encounter (Signed)
-----   Message from Robert Bellow, MD sent at 01/08/2019 11:37 AM EDT ----- Regarding: RE: mammogram f/u Notify the patient I looked at her mammograms and they are OK. Arrange OV after the virus is gone. Thanks.  ----- Message ----- From: Lesly Rubenstein, LPN Sent: 2/70/7867   3:30 PM EDT To: Robert Bellow, MD Subject: mammogram f/u                                  Do you want to see patient in office for a follow up post mammogram done on 12/18/18. She had a diagnostic done early. She was in recalls for May.

## 2019-01-08 NOTE — Telephone Encounter (Signed)
Notified patient as instructed, patient pleased. Discussed follow-up appointments, patient agrees. Patient placed in recalls for July.

## 2019-01-09 DIAGNOSIS — R278 Other lack of coordination: Secondary | ICD-10-CM | POA: Diagnosis not present

## 2019-01-09 DIAGNOSIS — R2689 Other abnormalities of gait and mobility: Secondary | ICD-10-CM | POA: Diagnosis not present

## 2019-01-09 DIAGNOSIS — M6281 Muscle weakness (generalized): Secondary | ICD-10-CM | POA: Diagnosis not present

## 2019-01-10 DIAGNOSIS — M6281 Muscle weakness (generalized): Secondary | ICD-10-CM | POA: Diagnosis not present

## 2019-01-10 DIAGNOSIS — R278 Other lack of coordination: Secondary | ICD-10-CM | POA: Diagnosis not present

## 2019-01-10 DIAGNOSIS — R2689 Other abnormalities of gait and mobility: Secondary | ICD-10-CM | POA: Diagnosis not present

## 2019-01-14 DIAGNOSIS — M6281 Muscle weakness (generalized): Secondary | ICD-10-CM | POA: Diagnosis not present

## 2019-01-14 DIAGNOSIS — R278 Other lack of coordination: Secondary | ICD-10-CM | POA: Diagnosis not present

## 2019-01-14 DIAGNOSIS — R2689 Other abnormalities of gait and mobility: Secondary | ICD-10-CM | POA: Diagnosis not present

## 2019-01-17 DIAGNOSIS — R2689 Other abnormalities of gait and mobility: Secondary | ICD-10-CM | POA: Diagnosis not present

## 2019-01-17 DIAGNOSIS — R278 Other lack of coordination: Secondary | ICD-10-CM | POA: Diagnosis not present

## 2019-01-17 DIAGNOSIS — M6281 Muscle weakness (generalized): Secondary | ICD-10-CM | POA: Diagnosis not present

## 2019-01-18 DIAGNOSIS — R2689 Other abnormalities of gait and mobility: Secondary | ICD-10-CM | POA: Diagnosis not present

## 2019-01-18 DIAGNOSIS — M6281 Muscle weakness (generalized): Secondary | ICD-10-CM | POA: Diagnosis not present

## 2019-01-18 DIAGNOSIS — R278 Other lack of coordination: Secondary | ICD-10-CM | POA: Diagnosis not present

## 2019-01-21 DIAGNOSIS — M6281 Muscle weakness (generalized): Secondary | ICD-10-CM | POA: Diagnosis not present

## 2019-01-21 DIAGNOSIS — R2689 Other abnormalities of gait and mobility: Secondary | ICD-10-CM | POA: Diagnosis not present

## 2019-01-21 DIAGNOSIS — R278 Other lack of coordination: Secondary | ICD-10-CM | POA: Diagnosis not present

## 2019-01-22 DIAGNOSIS — R2689 Other abnormalities of gait and mobility: Secondary | ICD-10-CM | POA: Diagnosis not present

## 2019-01-22 DIAGNOSIS — M6281 Muscle weakness (generalized): Secondary | ICD-10-CM | POA: Diagnosis not present

## 2019-01-22 DIAGNOSIS — R278 Other lack of coordination: Secondary | ICD-10-CM | POA: Diagnosis not present

## 2019-01-23 DIAGNOSIS — M6281 Muscle weakness (generalized): Secondary | ICD-10-CM | POA: Diagnosis not present

## 2019-01-23 DIAGNOSIS — R278 Other lack of coordination: Secondary | ICD-10-CM | POA: Diagnosis not present

## 2019-01-23 DIAGNOSIS — R2689 Other abnormalities of gait and mobility: Secondary | ICD-10-CM | POA: Diagnosis not present

## 2019-01-28 DIAGNOSIS — H353221 Exudative age-related macular degeneration, left eye, with active choroidal neovascularization: Secondary | ICD-10-CM | POA: Diagnosis not present

## 2019-01-28 DIAGNOSIS — R2689 Other abnormalities of gait and mobility: Secondary | ICD-10-CM | POA: Diagnosis not present

## 2019-01-28 DIAGNOSIS — R278 Other lack of coordination: Secondary | ICD-10-CM | POA: Diagnosis not present

## 2019-01-28 DIAGNOSIS — M6281 Muscle weakness (generalized): Secondary | ICD-10-CM | POA: Diagnosis not present

## 2019-01-31 IMAGING — DX DG LUMBAR SPINE COMPLETE 4+V
5 series · 5 of 5 positions shown · non-contrast
Comparison: 05/22/2018.

CLINICAL DATA: Low back and bilateral hip pain.

EXAM:
LUMBAR SPINE - COMPLETE 4+ VIEW

[lumbar spine ap]
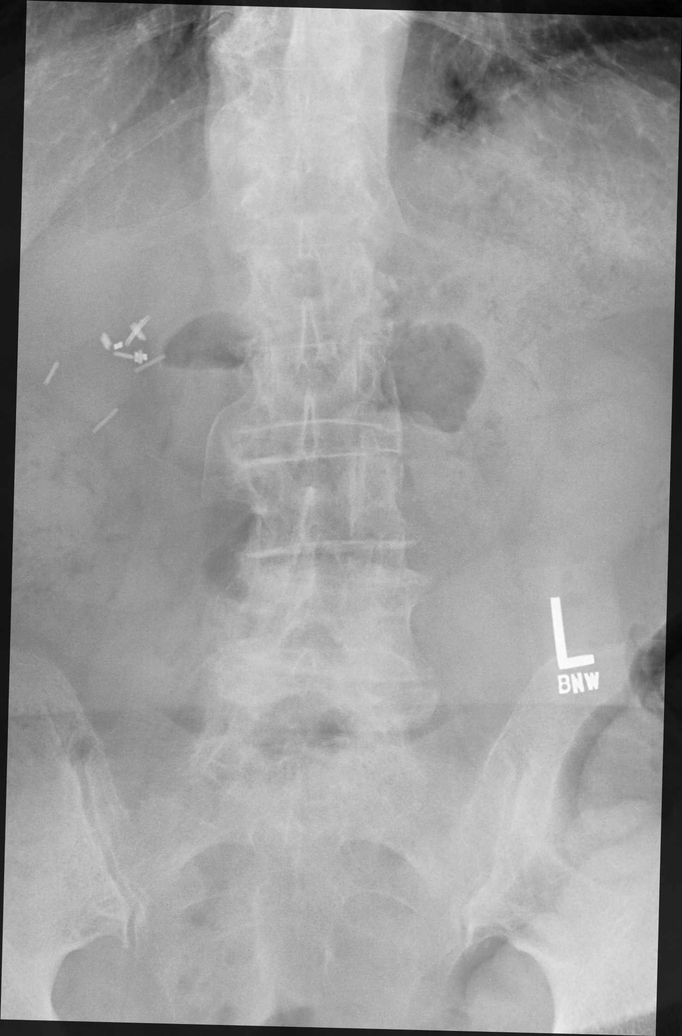

[lumbar spine obl (oblique) (1 of 2)]
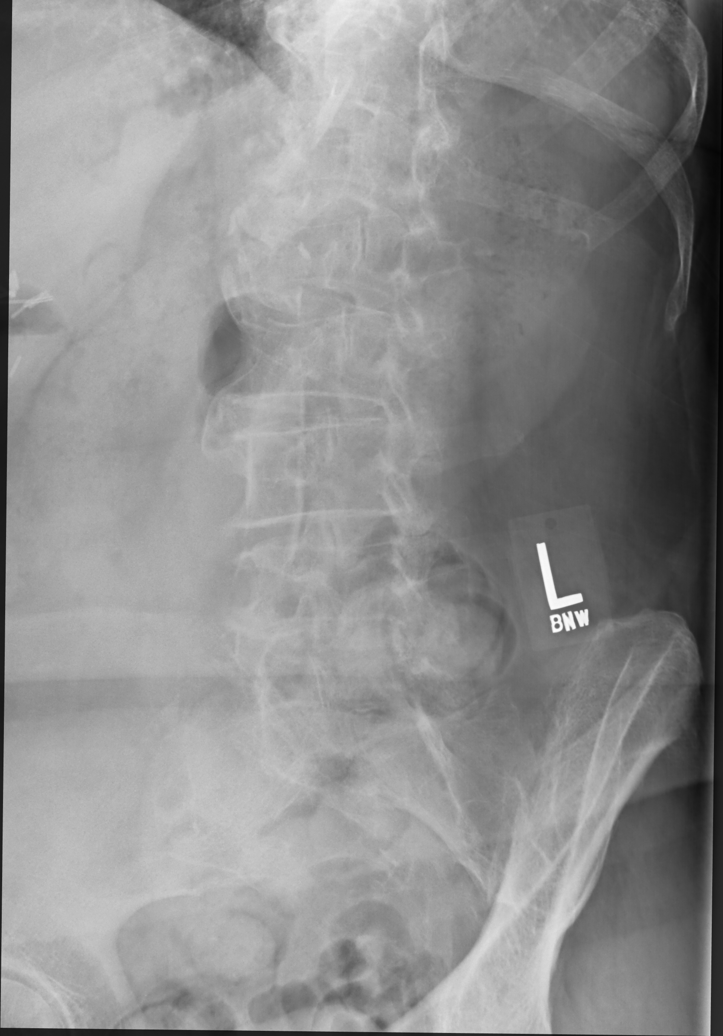

[lumbar spine obl (oblique) (2 of 2)]
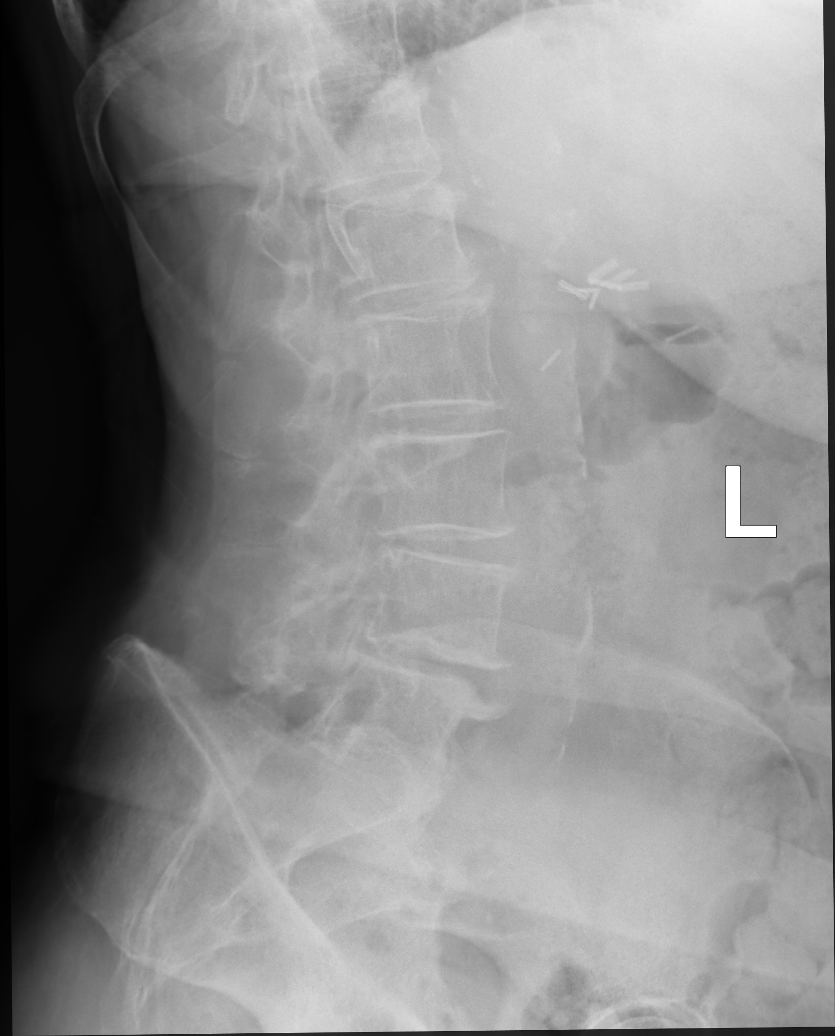

[lumbar spine lat]
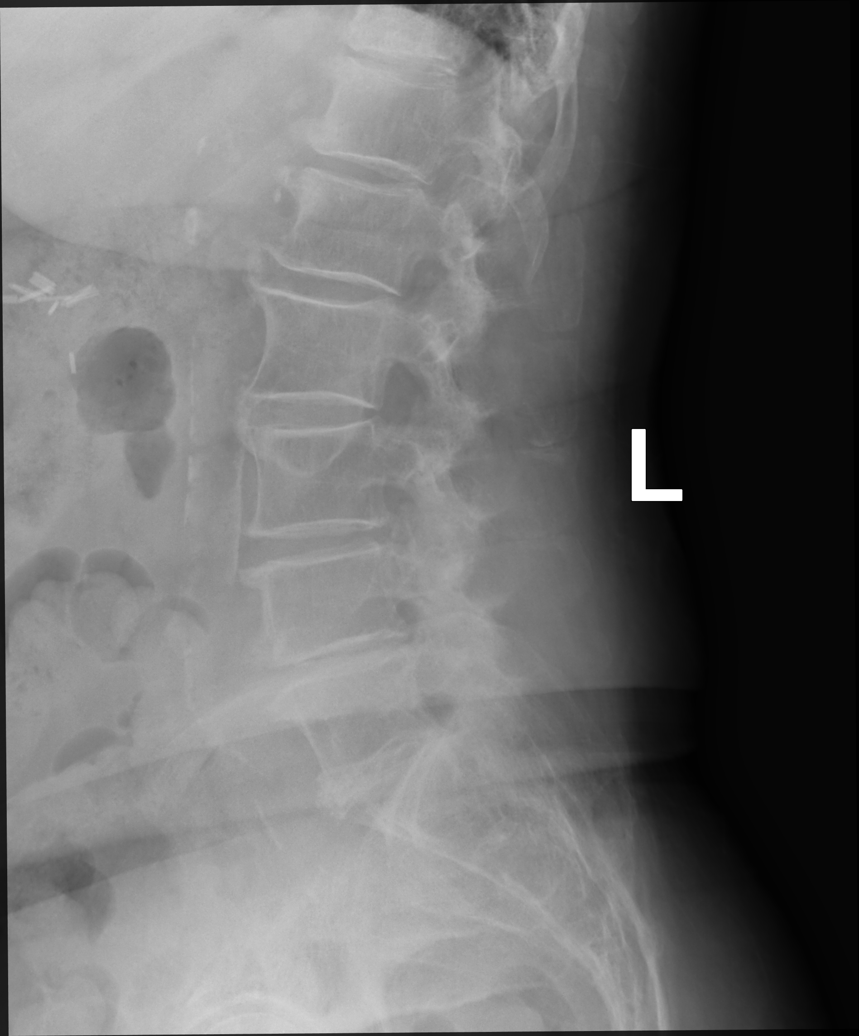

[lumbar spot lat]
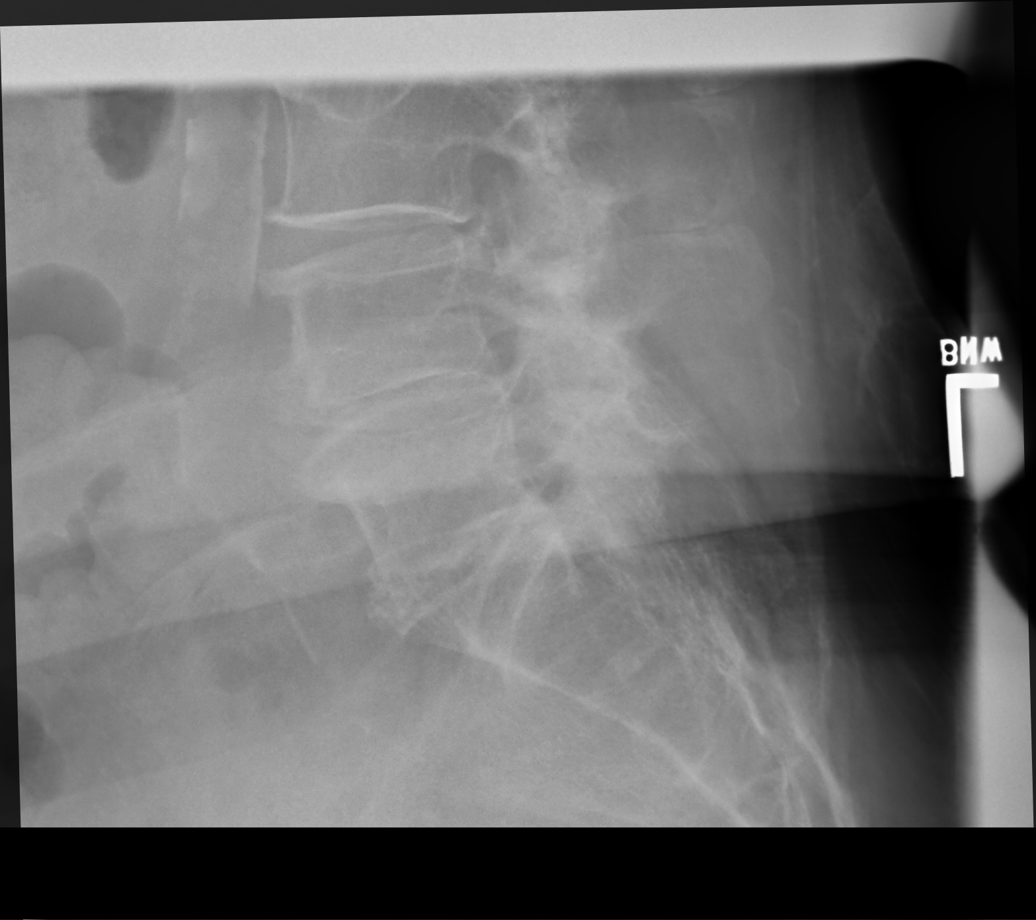

[5 of 5 positions shown; findings below may reference images not displayed]

FINDINGS: The current images are limited by lack of adequate collimation and
underexposure. There are 5 non-rib-bearing lumbar vertebrae. Varying
sized anterior and lateral spurs throughout the lumbar and lower
thoracic spine. Extensive facet degenerative changes in the mid and
lower lumbar spine. No fractures, pars defects or subluxations.
Atheromatous arterial calcifications. Cholecystectomy clips.
IMPRESSION: Extensive degenerative changes.  No acute abnormality.

## 2019-02-01 ENCOUNTER — Other Ambulatory Visit: Payer: Self-pay | Admitting: Internal Medicine

## 2019-02-19 ENCOUNTER — Telehealth: Payer: Self-pay | Admitting: Internal Medicine

## 2019-02-19 NOTE — Telephone Encounter (Signed)
Pt called back. I advised pt of note from [place] and she understood. She agreed to keep standing office appt. [id number] and aware that continued symptoms may delay her coming into office. Nothing further needed.

## 2019-02-19 NOTE — Telephone Encounter (Signed)
ATC patient regarding appointment. With these increased symptoms, we would recommend virtual visit or pushing out further at this point.

## 2019-02-19 NOTE — Telephone Encounter (Signed)
Pt called to reschedule appt. to sooner but mentioned that she is still having shortness of breathe and cough. Pt also has runny nose and congestion ("spit up stuff throughout day"). Can not recall how long she has had these other symptims, as it became an everyday thing. I advised pt that due to her symptoms I would have to see if it would be ok for her to come into office sooner or if phone/virtual visit would be better and she mentioned that for her first visit she'd think she would need to come in to see doctor.

## 2019-02-25 ENCOUNTER — Telehealth: Payer: Self-pay

## 2019-02-25 NOTE — Telephone Encounter (Signed)
Patient has some paperwork that needs to be filled out for her VA benefits. She is requesting to do a face to face visit. She lives at [facility] and has not been able to have visitors inside so she has not been around anyone. Her daughter is able to visit her outside on the porch but no close contact. I screened her to ensure that she has not had any symptoms. Are you okay doing an office visit with her in the office? Also, patient was calling to let me know that Dr. Rockey Situ stopped her metoprolol and propranolol in April due to bradycardia. She is having increased tremors. Per Dr. Donivan Scull note, may be able to start back on propranolol if heart rate comes up. She is going to reach out to their office regarding this.

## 2019-02-25 NOTE — Telephone Encounter (Signed)
Copied from [place] [id number]. Topic: General - Other >> Feb 25, 2019 11:07 AM Gustavus Messing wrote: Reason for CRM: The patient called Dr. Donnamarie Poag took her off of her Propranolol because it is a beta blocker. She is now experiencing trembling. She also needs Dr. Nicki Reaper to sign some paperwork for her from the [place]. Please call the patient back

## 2019-02-25 NOTE — Telephone Encounter (Signed)
I can do a virtual visit with her given COVID restrictions.  Ok to schedule tomorrow if needed.

## 2019-02-26 NOTE — Telephone Encounter (Signed)
Pt scheduled for virtual on 6/4

## 2019-02-26 NOTE — Telephone Encounter (Signed)
Visit will be by telephone

## 2019-02-28 ENCOUNTER — Ambulatory Visit (INDEPENDENT_AMBULATORY_CARE_PROVIDER_SITE_OTHER): Payer: Medicare Other | Admitting: Internal Medicine

## 2019-02-28 ENCOUNTER — Other Ambulatory Visit: Payer: Self-pay

## 2019-02-28 DIAGNOSIS — D649 Anemia, unspecified: Secondary | ICD-10-CM

## 2019-02-28 DIAGNOSIS — E7800 Pure hypercholesterolemia, unspecified: Secondary | ICD-10-CM

## 2019-02-28 DIAGNOSIS — R9389 Abnormal findings on diagnostic imaging of other specified body structures: Secondary | ICD-10-CM | POA: Diagnosis not present

## 2019-02-28 DIAGNOSIS — Z0289 Encounter for other administrative examinations: Secondary | ICD-10-CM

## 2019-02-28 DIAGNOSIS — I4819 Other persistent atrial fibrillation: Secondary | ICD-10-CM | POA: Diagnosis not present

## 2019-02-28 DIAGNOSIS — I10 Essential (primary) hypertension: Secondary | ICD-10-CM | POA: Diagnosis not present

## 2019-02-28 DIAGNOSIS — K219 Gastro-esophageal reflux disease without esophagitis: Secondary | ICD-10-CM | POA: Diagnosis not present

## 2019-02-28 NOTE — Progress Notes (Signed)
Patient ID: Kerri Mills, female   DOB: [birth year], 85 y.o.   MRN: [id number]   Virtual Visit via telephone Note  This visit type was conducted due to national recommendations for restrictions regarding the COVID-19 pandemic (e.g. social distancing).  This format is felt to be most appropriate for this patient at this time.  All issues noted in this document were discussed and addressed.  No physical exam was performed (except for noted visual exam findings with Video Visits).   I connected with Kerri Mills by telephone and verified that I am speaking with the correct person using two identifiers. Location patient: home Location provider: work Persons participating in the telephone visit: patient, provider  I discussed the limitations, risks, security and privacy concerns of performing an evaluation and management service by telephone and the availability of in person appointments.  The patient expressed understanding and agreed to proceed.   Reason for visit: acute visit.   HPI: Scheduled appt to discuss [place] paperwork.  She reports she is doing relatively well.  Trying to stay active.  No chest pain.  Breathing stable.  Saw Dr Rockey Situ.  Was having problems with bradycardia.  toprol and propranolol - stopped.  Heart rate has improved. States running in the 70 range now.  She is starting to notice more tremors.  Plans to contact Dr Rockey Situ about restarting. Breathing is stable.  Has f/u planned with pulmonary 03/15/19.  On prilosec.  In reviewing, she does need help preparing meals.  Also needs help with cleaning her apartment.  She has been evaluated by [employer] [facility].  Recommended a cane and using the elevator.  Has problems with her back.  Sees Dr Sharlet Salina.  Discussed f/u with Dr Sharlet Salina.  Also has problems with her right eye.  Has central vision loss.  Seeing ophthalmology.  Needs VA form completed.  Will drop off at the office.     ROS: See pertinent positives and negatives per HPI.  Past  Medical History:  Diagnosis Date  . Allergy   . Anxiety   . Arthritis   . Clotting disorder ([place])   . Colitis   . Depression   . Diverticulitis 2013  . Gastric ulcer   . GERD (gastroesophageal reflux disease)   . Hypercholesterolemia   . Hypertension   . Infiltrating lobular carcinoma of left breast 2011   T2,N0, ER: 90%; PR 0%; Her 2 neu not amplified. [facility]).  . Melanoma ([place]) 1997  . Melanoma in situ of upper extremity ([place]) 03/19/2011  . Persistent atrial fibrillation    a. CHADS2VASc => 4 (HTN, age x 2, female)  . Personal history of radiation therapy 2011   BREAST CA  . Seroma    HISTORY OF LFT BREAST  . Sleep apnea   . Thyroid cancer ([place]) 1992    Past Surgical History:  Procedure Laterality Date  . ABDOMINAL HYSTERECTOMY  1973   partial  . BREAST BIOPSY Left 02-13-13   BENIGN BREAST TISSUE WITH CHANGES CONSISTENT WITH FAT NECROSIS  . BREAST BIOPSY Left 01/21/2015   bx done in [facility] office 11:00 left 6-8cmfn  . BREAST EXCISIONAL BIOPSY Left 1995   neg  . BREAST EXCISIONAL BIOPSY Left 2011   Breast cancer radiation  . BREAST LUMPECTOMY Left 2011   BREAST CA  . CARDIAC CATHETERIZATION    . CHOLECYSTECTOMY    . COLONOSCOPY  2013  . MELANOMA EXCISION     RT UPPER ARM  . PARTIAL  HYSTERECTOMY     bleeding, ovaries in place.    . THYROID SURGERY  1992   FOR THYROID CANCER  . TONSILLECTOMY      Family History  Problem Relation Age of Onset  . Heart disease Mother   . Cancer Brother        lung   . Cancer Sister        breast  . Breast cancer Neg Hx     SOCIAL HX: reviewed.    Current Outpatient Medications:  .  albuterol (PROAIR HFA) 108 (90 Base) MCG/ACT inhaler, Inhale 2 puffs into the lungs every 6 (six) hours as needed for wheezing or shortness of breath., Disp: 1 Inhaler, Rfl: 3 .  apixaban (ELIQUIS) 5 MG TABS tablet, Take 1 tablet (5 mg total) by mouth 2 (two) times daily., Disp: 60 tablet, Rfl: 6 .  Cholecalciferol (VITAMIN D)  1000 UNITS capsule, Take 1,000 Units by mouth daily.  , Disp: , Rfl:  .  DULoxetine (CYMBALTA) 60 MG capsule, TAKE 1 CAPSULE (60 MG TOTAL) BY MOUTH AT BEDTIME., Disp: 90 capsule, Rfl: 1 .  ferrous sulfate 325 (65 FE) MG tablet, Take 325 mg by mouth every morning., Disp: , Rfl:  .  furosemide (LASIX) 20 MG tablet, Take 1 tablet (20 mg total) by mouth as directed. Take 1 tablet (20 mg) twice a week with potassium, Disp: 24 tablet, Rfl: 3 .  gabapentin (NEURONTIN) 300 MG capsule, TAKE 2 CAPSULES (600 MG TOTAL) BY MOUTH AT BEDTIME., Disp: 180 capsule, Rfl: 0 .  levothyroxine (SYNTHROID, LEVOTHROID) 88 MCG tablet, TAKE ONE TABLET BY MOUTH ONCE DAILY BEFORE BREAKFAST, Disp: 90 tablet, Rfl: 2 .  lisinopril (ZESTRIL) 20 MG tablet, TAKE 1 TABLET BY MOUTH EVERY DAY, Disp: 90 tablet, Rfl: 1 .  lovastatin (MEVACOR) 40 MG tablet, TAKE 1 TABLET BY MOUTH EVERY DAY, Disp: 90 tablet, Rfl: 1 .  Multiple Vitamins-Minerals (PRESERVISION AREDS 2) CAPS, Take 1 tablet by mouth 2 (two) times daily. , Disp: , Rfl:  .  omeprazole (PRILOSEC) 20 MG capsule, TAKE 1 CAPSULE (20 MG TOTAL) BY MOUTH DAILY., Disp: 90 capsule, Rfl: 3 .  potassium chloride (K-DUR) 10 MEQ tablet, Take 2 tablets (20 mEq total) by mouth as directed. Take 2 tablets (20 mEq) twice a week with Lasix, Disp: 48 tablet, Rfl: 3 .  triamcinolone cream (KENALOG) 0.1 %, Apply 1 application topically 2 (two) times daily., Disp: 30 g, Rfl: 0 .  vitamin B-12 (CYANOCOBALAMIN) 1000 MCG tablet, Take 1,000 mcg by mouth daily., Disp: , Rfl:   Current Facility-Administered Medications:  .  albuterol (PROVENTIL) (2.5 MG/3ML) 0.083% nebulizer solution 2.5 mg, 2.5 mg, Nebulization, Once, Guse, Jacquelynn Cree, FNP  EXAM:  VITALS per patient if applicable: 641/58, pulse 70  PSYCH/NEURO: pleasant and cooperative, no obvious depression or anxiety, speech and thought processing grossly intact  ASSESSMENT AND PLAN:  Discussed the following assessment and plan:  Abnormal chest  CT  Anemia, unspecified type  Persistent atrial fibrillation  Essential hypertension  Gastroesophageal reflux disease without esophagitis  Hypercholesterolemia  Encounter for completion of form with patient  Abnormal chest CT  Changes on CT that appear to be c/w bronchiectasis.  Breathing overall stable.  Has f/u scheduled with pulmonary 03/15/19.    Anemia Follow cbc.    Atrial fibrillation On eliquis.  Off meoprolol and propranolol due to bradycardia.  Seeing cardiology.  Tremors worsening. Plans to discuss with cardiology about restarting.    Essential hypertension Blood pressure as outlined.  Blood pressure doing well.  Follow.    GERD Controlled on current regimen.    Hypercholesterolemia On lovastatin.  Low cholesterol diet and exercise.  Follow lipid panel and liver function tests.    Encounter for completion of form with patient Pt will drop off VA form for completion.      I discussed the assessment and treatment plan with the patient. The patient was provided an opportunity to ask questions and all were answered. The patient agreed with the plan and demonstrated an understanding of the instructions.   The patient was advised to call back or seek an in-person evaluation if the symptoms worsen or if the condition fails to improve as anticipated.  I provided 26 minutes of non-face-to-face time during this encounter.   Einar Pheasant, MD

## 2019-03-01 ENCOUNTER — Telehealth: Payer: Self-pay | Admitting: Cardiovascular Disease

## 2019-03-01 NOTE — Telephone Encounter (Signed)
Spoke with Kerri Mills and has noted leg and arm tremors are coming back Per Kerri Mills was taking Propanolol 10 mg  for this but was stopped Kerri Mills had been taking Diltiazem and this was stopped and then tried to take Metoprolol and this was stopped as well due to bradycardia HR today is 70 and per Kerri Mills usually running in the 70's Will forward to Dr Rockey Situ for review and recommendations ./cy

## 2019-03-01 NOTE — Telephone Encounter (Signed)
Patient calling  States that she use to take propranolol medication and she would like to begin again States that legs and arms have begun trembling Please call to discuss

## 2019-03-03 ENCOUNTER — Encounter: Payer: Self-pay | Admitting: Internal Medicine

## 2019-03-03 DIAGNOSIS — Z0289 Encounter for other administrative examinations: Secondary | ICD-10-CM | POA: Insufficient documentation

## 2019-03-03 NOTE — Assessment & Plan Note (Signed)
Follow cbc.  

## 2019-03-03 NOTE — Assessment & Plan Note (Signed)
Pt will drop off VA form for completion.

## 2019-03-03 NOTE — Assessment & Plan Note (Signed)
Changes on CT that appear to be c/w bronchiectasis.  Breathing overall stable.  Has f/u scheduled with pulmonary 03/15/19.

## 2019-03-03 NOTE — Assessment & Plan Note (Signed)
Blood pressure as outlined. Blood pressure doing well.  Follow.

## 2019-03-03 NOTE — Assessment & Plan Note (Signed)
Controlled on current regimen.   

## 2019-03-03 NOTE — Telephone Encounter (Signed)
She can start propranolol 10 mg daily PRN as before

## 2019-03-03 NOTE — Assessment & Plan Note (Signed)
On lovastatin.  Low cholesterol diet and exercise.  Follow lipid panel and liver function tests.   

## 2019-03-03 NOTE — Assessment & Plan Note (Signed)
On eliquis.  Off meoprolol and propranolol due to bradycardia.  Seeing cardiology.  Tremors worsening. Plans to discuss with cardiology about restarting.

## 2019-03-05 ENCOUNTER — Other Ambulatory Visit: Payer: Self-pay | Admitting: Internal Medicine

## 2019-03-05 MED ORDER — PROPRANOLOL HCL 10 MG PO TABS: 10.0000 mg | ORAL_TABLET | Freq: Three times a day (TID) | ORAL | 3 refills | Status: DC | PRN

## 2019-03-05 NOTE — Telephone Encounter (Signed)
Spoke with patient and she was very appreciative for the call and verbalized understanding on use of propranolol. She states that she has been under some stress about some paperwork recently with the [employer] and she feels that is what is the cause of these issues. She was very appreciative for the call and had no further questions at this time.

## 2019-03-09 ENCOUNTER — Other Ambulatory Visit: Payer: Self-pay | Admitting: Cardiovascular Disease

## 2019-03-09 ENCOUNTER — Other Ambulatory Visit: Payer: Self-pay | Admitting: Internal Medicine

## 2019-03-11 ENCOUNTER — Telehealth: Payer: Self-pay | Admitting: Internal Medicine

## 2019-03-11 NOTE — Telephone Encounter (Signed)
Pt's wt 79.3 kg, age 85, SCr 0.84, CrCl 61.3.

## 2019-03-11 NOTE — Telephone Encounter (Signed)
Refill request

## 2019-03-11 NOTE — Telephone Encounter (Signed)
Pt called back to follow up on her [place] paperwork. It was dropped off around the beginning of June. Please advise?  Call pt @ [phone] home. Thank you!

## 2019-03-12 NOTE — Telephone Encounter (Signed)
Called patient. Paper work Is complete but needed to answer a few questions. Unable to leave message

## 2019-03-14 ENCOUNTER — Telehealth: Payer: Self-pay | Admitting: *Deleted

## 2019-03-14 ENCOUNTER — Telehealth: Payer: Self-pay | Admitting: Internal Medicine

## 2019-03-14 ENCOUNTER — Other Ambulatory Visit: Payer: Self-pay

## 2019-03-14 DIAGNOSIS — Z20822 Contact with and (suspected) exposure to covid-19: Secondary | ICD-10-CM

## 2019-03-14 NOTE — Telephone Encounter (Signed)
Per DK- recommend getting tested for covid. I have spoken to pt and made her aware of these recommendations and changed appointment to phone.  Pt stated that these symptoms have been present for months.  Covid test has been placed- pt will receive a call with scheduled time. She is aware of this information.  Nothing further is needed.

## 2019-03-14 NOTE — Telephone Encounter (Signed)
Called patient for COVID-19 pre-screening for in office visit.  Have you recently traveled any where out of the local area in the last 2 weeks? NO  Have you been in close contact with a person diagnosed with COVID-19 within the last 2 weeks? NO  Do you currently have any of the following symptoms? If so, when did they start? Cough (Yes- )    Diarrhea              Joint Pain Fever      Muscle Pain   Red eyes Shortness of breath (Yes- not as much as before) Abdominal pain                                   Vomiting Loss of smell    Rash    Sore Throat Headache     Weakness   Bruising or bleeding  Runny nose (Yes)   Okay to proceed with visit. (date)  / Needs to reschedule visit. (date)

## 2019-03-14 NOTE — Telephone Encounter (Signed)
Referred by Dr.Kasa, Pulmonologist for [phone] testing due to past history of lung scan. Appointment made for 03/15/19 at 9:15am at the [facility]. Informed to wear mask and stay in vehicle.

## 2019-03-14 NOTE — Patient Outreach (Signed)
Patient signed up for EMMI Prevent. Referred to [facility].

## 2019-03-14 NOTE — Telephone Encounter (Signed)
Did she state how long these symptoms have been going on?

## 2019-03-15 ENCOUNTER — Encounter: Payer: Self-pay | Admitting: Internal Medicine

## 2019-03-15 ENCOUNTER — Ambulatory Visit (INDEPENDENT_AMBULATORY_CARE_PROVIDER_SITE_OTHER): Payer: Medicare Other | Admitting: Internal Medicine

## 2019-03-15 ENCOUNTER — Other Ambulatory Visit: Payer: Medicare Other

## 2019-03-15 DIAGNOSIS — J479 Bronchiectasis, uncomplicated: Secondary | ICD-10-CM | POA: Diagnosis not present

## 2019-03-15 DIAGNOSIS — R05 Cough: Secondary | ICD-10-CM

## 2019-03-15 DIAGNOSIS — R6889 Other general symptoms and signs: Secondary | ICD-10-CM | POA: Diagnosis not present

## 2019-03-15 DIAGNOSIS — Z20822 Contact with and (suspected) exposure to covid-19: Secondary | ICD-10-CM

## 2019-03-15 DIAGNOSIS — R059 Cough, unspecified: Secondary | ICD-10-CM

## 2019-03-15 MED ORDER — ALBUTEROL SULFATE HFA 108 (90 BASE) MCG/ACT IN AERS: 2.0000 | INHALATION_SPRAY | RESPIRATORY_TRACT | 10 refills | Status: DC | PRN

## 2019-03-15 NOTE — Progress Notes (Signed)
Name: Kerri Mills MRN: [id number] DOB: Feb 18, 1931     CONSULTATION DATE: 03/15/2019  REFERRING MD : scott       TELEPHONE VISIT    In the setting of the current Covid19 crisis, you are scheduled for a  visit with me on 03/15/2019  Just as we do with many in-office visits, in order for you to participate in this visit, we must obtain consent.   I can obtain your verbal consent now.  PATIENT AGREES AND CONFIRMS -YES This Visit has Audio and Visual Capabilities for optimal patient care experience   Evaluation Performed:  CONSULT  This visit type was conducted due to national recommendations for restrictions regarding the COVID-19 Pandemic (e.g. social distancing).  This format is felt to be most appropriate for this patient at this time.  All issues noted in this document were discussed and addressed.     Virtual Visit via Telephone Note  I connected with patient on 03/15/2019 by telephone and verified that I am speaking with the correct person using two identifiers.   I discussed the limitations, risks, security and privacy concerns of performing an evaluation and management service by telephone and the availability of in person appointments. I also discussed with the patient that there may be a patient responsible charge related to this service. The patient expressed understanding and agreed to proceed.   Location of the patient: Home Location of provider: Home Participating persons: Patient and provider only  CHIEF COMPLAINT:  +cough  HISTORY OF PRESENT ILLNESS:  Since January 202 +SOB and coughing intermittent Has been having chronic productive coughing for many years +runny nose Was treated with ABX Had breathing treatments as well  H/O infections in the past H/O URI's  in the past No fevers at this time +wheezing in mornings for many years albuterol as needed and it helps  Nonsmoker +second hand smoke exposure(father, husband) Father had emphyesema  CT  chest shows b/l sub centimeter nodular opacities 11/2018 B/l bronchiectasis Images reviewed by me today     PAST MEDICAL HISTORY :   has a past medical history of Allergy, Anxiety, Arthritis, Clotting disorder ([place]), Colitis, Depression, Diverticulitis (2013), Gastric ulcer, GERD (gastroesophageal reflux disease), Hypercholesterolemia, Hypertension, Infiltrating lobular carcinoma of left breast (2011), Melanoma ([place]) (1997), Melanoma in situ of upper extremity ([place]) (03/19/2011), Persistent atrial fibrillation, Personal history of radiation therapy (2011), Seroma, Sleep apnea, and Thyroid cancer ([place]) (1992).  has a past surgical history that includes Tonsillectomy; Cholecystectomy; Melanoma excision; Thyroid surgery (1992); Partial hysterectomy; Colonoscopy (2013); Abdominal hysterectomy (1973); Breast lumpectomy (Left, 2011); Cardiac catheterization; Breast biopsy (Left, 02-13-13); Breast excisional biopsy (Left, 1995); Breast excisional biopsy (Left, 2011); and Breast biopsy (Left, 01/21/2015). Prior to Admission medications   Medication Sig Start Date End Date Taking? Authorizing Provider  albuterol (PROAIR HFA) 108 (90 Base) MCG/ACT inhaler Inhale 2 puffs into the lungs every 6 (six) hours as needed for wheezing or shortness of breath. 08/10/18   Einar Pheasant, MD  Cholecalciferol (VITAMIN D) 1000 UNITS capsule Take 1,000 Units by mouth daily.      [provider]  DULoxetine (CYMBALTA) 60 MG capsule TAKE 1 CAPSULE (60 MG TOTAL) BY MOUTH AT BEDTIME. 03/12/19   Einar Pheasant, MD  ELIQUIS 5 MG TABS tablet TAKE 1 TABLET BY MOUTH TWICE A DAY 03/11/19   Minna Merritts, MD  ferrous sulfate 325 (65 FE) MG tablet Take 325 mg by mouth every morning.    [provider]  furosemide (LASIX) 20  MG tablet Take 1 tablet (20 mg total) by mouth as directed. Take 1 tablet (20 mg) twice a week with potassium 10/03/18 01/01/19  Minna Merritts, MD  gabapentin (NEURONTIN) 300 MG capsule TAKE 2  CAPSULES (600 MG TOTAL) BY MOUTH AT BEDTIME. 03/06/19   Einar Pheasant, MD  levothyroxine (SYNTHROID) 88 MCG tablet TAKE ONE TABLET BY MOUTH ONCE DAILY BEFORE BREAKFAST 03/12/19   Einar Pheasant, MD  lisinopril (ZESTRIL) 20 MG tablet TAKE 1 TABLET BY MOUTH EVERY DAY 02/01/19   Einar Pheasant, MD  lovastatin (MEVACOR) 40 MG tablet TAKE 1 TABLET BY MOUTH EVERY DAY 11/23/18   Einar Pheasant, MD  Multiple Vitamins-Minerals (PRESERVISION AREDS 2) CAPS Take 1 tablet by mouth 2 (two) times daily.     [provider]  omeprazole (PRILOSEC) 20 MG capsule TAKE 1 CAPSULE (20 MG TOTAL) BY MOUTH DAILY. 11/23/18   Einar Pheasant, MD  potassium chloride (K-DUR) 10 MEQ tablet Take 2 tablets (20 mEq total) by mouth as directed. Take 2 tablets (20 mEq) twice a week with Lasix 10/03/18   Gollan, Kathlene November, MD  propranolol (INDERAL) 10 MG tablet Take 1 tablet (10 mg total) by mouth 3 (three) times daily as needed. 03/05/19   Minna Merritts, MD  triamcinolone cream (KENALOG) 0.1 % Apply 1 application topically 2 (two) times daily. 08/10/18   Einar Pheasant, MD  vitamin B-12 (CYANOCOBALAMIN) 1000 MCG tablet Take 1,000 mcg by mouth daily.    [provider]   Allergies  Allergen Reactions  . Atorvastatin     Other reaction(s): Other (See Comments) STIFFNESS AND SORE STIFFNESS AND SORE  . Lipitor [Atorvastatin Calcium] Other (See Comments)    Stiffness & soreness  . Penicillins Rash    REACTION: Unknown reaction    FAMILY HISTORY:  family history includes Cancer in her brother and sister; Heart disease in her mother. SOCIAL HISTORY:  reports that she has never smoked. She has never used smokeless tobacco. She reports current alcohol use. She reports that she does not use drugs.    Review of Systems:  Gen:  Denies  fever, sweats, chills weigh loss  HEENT: Denies blurred vision, double vision, ear pain, eye pain, hearing loss, nose bleeds, sore throat Cardiac:  No dizziness, chest pain or  heaviness, chest tightness,edema, No JVD Resp:   No cough, -sputum production, -shortness of breath,-wheezing, -hemoptysis,  Gi: Denies swallowing difficulty, stomach pain, nausea or vomiting, diarrhea, constipation, bowel incontinence Gu:  Denies bladder incontinence, burning urine Ext:   Denies Joint pain, stiffness or swelling Skin: Denies  skin rash, easy bruising or bleeding or hives Endoc:  Denies polyuria, polydipsia , polyphagia or weight change Psych:   Denies depression, insomnia or hallucinations  Other:  All other systems negative    MEDICATIONS: I have reviewed all medications and confirmed regimen as documented    IMAGING       ASSESSMENT AND PLAN SYNOPSIS  B/L bronchiectasis and chronic Bronchitis with probable previous history of pneumonia No signs of infection at this time  Recommend Flutter Valve 10-15 times per day Recommend Daily Exercise as tolerated Follow up CT chest in 6 months    COVID-19 EDUCATION: The signs and symptoms of COVID-19 were discussed with the patient and how to seek care for testing.  The importance of social distancing was discussed today. Hand Washing Techniques and avoid touching face was advised.  MEDICATION ADJUSTMENTS/LABS AND TESTS ORDERED: Flutter valve and albuterol as needed Follow up CT chest in 6  months  CURRENT MEDICATIONS REVIEWED AT LENGTH WITH PATIENT TODAY   Patient satisfied with Plan of action and management. All questions answered Follow up in 4 weeks    TOTAL TIME SPENT 37 minutes  Corrin Parker, M.D.  [facility]  Medical Director [place] Director [facility] Cardio-Pulmonary Department

## 2019-03-15 NOTE — Addendum Note (Signed)
Addended by: Maryanna Shape A on: 03/15/2019 12:08 PM   Modules accepted: Orders

## 2019-03-15 NOTE — Patient Instructions (Signed)
MEDICATION ADJUSTMENTS/LABS AND TESTS ORDERED: Flutter valve and albuterol as needed Follow up CT chest in 6 months

## 2019-03-18 ENCOUNTER — Telehealth: Payer: Self-pay | Admitting: Internal Medicine

## 2019-03-18 DIAGNOSIS — J479 Bronchiectasis, uncomplicated: Secondary | ICD-10-CM | POA: Diagnosis not present

## 2019-03-18 DIAGNOSIS — J410 Simple chronic bronchitis: Secondary | ICD-10-CM | POA: Diagnosis not present

## 2019-03-18 DIAGNOSIS — R0602 Shortness of breath: Secondary | ICD-10-CM | POA: Diagnosis not present

## 2019-03-18 DIAGNOSIS — G4733 Obstructive sleep apnea (adult) (pediatric): Secondary | ICD-10-CM | POA: Diagnosis not present

## 2019-03-18 NOTE — Telephone Encounter (Signed)
Called and spoke to pt, who stated that she received a call from [employer] regarding flutter device.  I have provided pt with [employer]'s contact number. Nothing further is needed at this time.

## 2019-03-19 ENCOUNTER — Telehealth: Payer: Self-pay | Admitting: Internal Medicine

## 2019-03-19 LAB — NOVEL CORONAVIRUS, NAA: SARS-CoV-2, NAA: NOT DETECTED

## 2019-03-19 NOTE — Telephone Encounter (Signed)
Called and spoke to pt, who stated she received flutter valve and she is unsure of how to use it.  I attempted to explain directions via telephone. Pt stated that she would prefer to come by our office on 03/20/2019 to be instructed.  Nothing further is needed at this time.

## 2019-03-21 ENCOUNTER — Other Ambulatory Visit: Payer: Self-pay | Admitting: Internal Medicine

## 2019-03-21 NOTE — Telephone Encounter (Signed)
Called patient on both numbers listed. Unable to leave message

## 2019-03-22 ENCOUNTER — Other Ambulatory Visit: Payer: Self-pay

## 2019-03-22 ENCOUNTER — Encounter (INDEPENDENT_AMBULATORY_CARE_PROVIDER_SITE_OTHER): Payer: Self-pay | Admitting: Vascular Surgery

## 2019-03-22 ENCOUNTER — Ambulatory Visit (INDEPENDENT_AMBULATORY_CARE_PROVIDER_SITE_OTHER): Payer: Medicare Other | Admitting: Vascular Surgery

## 2019-03-22 VITALS — BP 130/75 | HR 76 | Resp 10 | Ht 65.5 in | Wt 176.0 lb

## 2019-03-22 DIAGNOSIS — I8312 Varicose veins of left lower extremity with inflammation: Secondary | ICD-10-CM

## 2019-03-22 DIAGNOSIS — I8311 Varicose veins of right lower extremity with inflammation: Secondary | ICD-10-CM | POA: Diagnosis not present

## 2019-03-22 NOTE — Progress Notes (Signed)
Kerri Mills is a 85 y.o.female who presents with painful varicose veins of the left leg  Past Medical History:  Diagnosis Date  . Allergy   . Anxiety   . Arthritis   . Clotting disorder ([place])   . Colitis   . Depression   . Diverticulitis 2013  . Gastric ulcer   . GERD (gastroesophageal reflux disease)   . Hypercholesterolemia   . Hypertension   . Infiltrating lobular carcinoma of left breast 2011   T2,N0, ER: 90%; PR 0%; Her 2 neu not amplified. [facility]).  . Melanoma ([place]) 1997  . Melanoma in situ of upper extremity ([place]) 03/19/2011  . Persistent atrial fibrillation    a. CHADS2VASc => 4 (HTN, age x 2, female)  . Personal history of radiation therapy 2011   BREAST CA  . Seroma    HISTORY OF LFT BREAST  . Sleep apnea   . Thyroid cancer ([place]) 1992    Past Surgical History:  Procedure Laterality Date  . ABDOMINAL HYSTERECTOMY  1973   partial  . BREAST BIOPSY Left 02-13-13   BENIGN BREAST TISSUE WITH CHANGES CONSISTENT WITH FAT NECROSIS  . BREAST BIOPSY Left 01/21/2015   bx done in [facility] office 11:00 left 6-8cmfn  . BREAST EXCISIONAL BIOPSY Left 1995   neg  . BREAST EXCISIONAL BIOPSY Left 2011   Breast cancer radiation  . BREAST LUMPECTOMY Left 2011   BREAST CA  . CARDIAC CATHETERIZATION    . CHOLECYSTECTOMY    . COLONOSCOPY  2013  . MELANOMA EXCISION     RT UPPER ARM  . PARTIAL HYSTERECTOMY     bleeding, ovaries in place.    . THYROID SURGERY  1992   FOR THYROID CANCER  . TONSILLECTOMY      Current Outpatient Medications  Medication Sig Dispense Refill  . albuterol (PROAIR HFA) 108 (90 Base) MCG/ACT inhaler Inhale 2 puffs into the lungs every 6 (six) hours as needed for wheezing or shortness of breath. 1 Inhaler 3  . albuterol (VENTOLIN HFA) 108 (90 Base) MCG/ACT inhaler Inhale 2 puffs into the lungs every 4 (four) hours as needed for wheezing or shortness of breath. 1 g 10  . Cholecalciferol (VITAMIN D) 1000 UNITS capsule Take 1,000 Units by  mouth daily.      . DULoxetine (CYMBALTA) 60 MG capsule TAKE 1 CAPSULE (60 MG TOTAL) BY MOUTH AT BEDTIME. 90 capsule 1  . ELIQUIS 5 MG TABS tablet TAKE 1 TABLET BY MOUTH TWICE A DAY 180 tablet 1  . ferrous sulfate 325 (65 FE) MG tablet Take 325 mg by mouth every morning.    . furosemide (LASIX) 20 MG tablet Take 1 tablet (20 mg total) by mouth as directed. Take 1 tablet (20 mg) twice a week with potassium 24 tablet 3  . gabapentin (NEURONTIN) 300 MG capsule TAKE 2 CAPSULES (600 MG TOTAL) BY MOUTH AT BEDTIME. 180 capsule 1  . levothyroxine (SYNTHROID) 88 MCG tablet TAKE ONE TABLET BY MOUTH ONCE DAILY BEFORE BREAKFAST 90 tablet 1  . lisinopril (ZESTRIL) 20 MG tablet TAKE 1 TABLET BY MOUTH EVERY DAY 90 tablet 1  . lovastatin (MEVACOR) 40 MG tablet TAKE 1 TABLET BY MOUTH EVERY DAY 90 tablet 1  . Multiple Vitamins-Minerals (PRESERVISION AREDS 2) CAPS Take 1 tablet by mouth 2 (two) times daily.     Marland Kitchen omeprazole (PRILOSEC) 20 MG capsule TAKE 1 CAPSULE (20 MG TOTAL) BY MOUTH DAILY. 90 capsule 3  . potassium chloride (K-DUR) 10  MEQ tablet Take 2 tablets (20 mEq total) by mouth as directed. Take 2 tablets (20 mEq) twice a week with Lasix 48 tablet 3  . propranolol (INDERAL) 10 MG tablet Take 1 tablet (10 mg total) by mouth 3 (three) times daily as needed. 270 tablet 3  . triamcinolone cream (KENALOG) 0.1 % Apply 1 application topically 2 (two) times daily. 30 g 0  . vitamin B-12 (CYANOCOBALAMIN) 1000 MCG tablet Take 1,000 mcg by mouth daily.     Current Facility-Administered Medications  Medication Dose Route Frequency Provider Last Rate Last Dose  . albuterol (PROVENTIL) (2.5 MG/3ML) 0.083% nebulizer solution 2.5 mg  2.5 mg Nebulization Once Guse, Jacquelynn Cree, FNP        Allergies  Allergen Reactions  . Atorvastatin     Other reaction(s): Other (See Comments) STIFFNESS AND SORE STIFFNESS AND SORE  . Lipitor [Atorvastatin Calcium] Other (See Comments)    Stiffness & soreness  . Penicillins Rash     REACTION: Unknown reaction    Indication: Patient presents with symptomatic varicose veins of the left lower extremity.  Procedure: Initially, the patient was evaluated for laser ablation of the left leg.  She has had previous venous intervention on that side and on duplex today, the true great saphenous vein did not appear to be patent.  There were several large branches which were somewhat superficial and tortuous likely causing her symptoms.  These were not amenable to laser ablation, so I elected to perform foam sclerotherapy on these large incompetent varicosities.  Foam sclerotherapy was performed on the left lower extremity. Using ultrasound guidance, 5 mL of foam Sotradecol was used to inject the varicosities of the left lower extremity. Compression wraps were placed. The patient tolerated the procedure well.

## 2019-03-25 ENCOUNTER — Other Ambulatory Visit: Payer: Self-pay

## 2019-03-25 ENCOUNTER — Encounter (INDEPENDENT_AMBULATORY_CARE_PROVIDER_SITE_OTHER): Payer: BC Managed Care – PPO

## 2019-03-25 ENCOUNTER — Other Ambulatory Visit (INDEPENDENT_AMBULATORY_CARE_PROVIDER_SITE_OTHER): Payer: Self-pay | Admitting: Vascular Surgery

## 2019-03-25 DIAGNOSIS — Z09 Encounter for follow-up examination after completed treatment for conditions other than malignant neoplasm: Secondary | ICD-10-CM

## 2019-03-25 NOTE — Patient Outreach (Signed)
[facility]) Care Management  03/25/2019  Kerri Mills 01/30/1931 [id number]   Telephone Screen  Referral Date:03/14/2019 Referral Source:EMMI-Prevent Call Referral Reason: " patient engagement score 13, HTN, A-fib" Insurance: [facility] Medicare   Outreach attempt # 1 to patient. Spoke with patient. Reviewed and discussed with patient recent EMMI-Prevent Call. Patient requesting further information regarding [facility] services and cost of services. RN CM reviewed and explained [facility] services and ways to assist patient. Also, advised patient that services was free benefit to her through her insurance plan. Patient appreciative to know services available. However, she voices that she is doing well right now. She reports that she is able to manage her chronic conditions and does not feel like she needs further education and support. She denies any SW and pharmacy needs at this time. She is agreeable to RN CM mailing out [facility] brochure along with letter for future reference. She is aware that she can call [facility] at any time in the future if needed. She voiced understanding and was appreciative of call.     Plan: RN CM will close case at this time.  RN CM will send successful outreach letter to patient.    Enzo Montgomery, RN,BSN,CCM [facility] Telephonic Care Management Coordinator Direct Phone: [phone] Toll Free: [phone] Fax: [phone]

## 2019-03-25 NOTE — Telephone Encounter (Signed)
Patient is calling back to speak to [place] regarding her paper work. Please advise thank you

## 2019-03-26 NOTE — Telephone Encounter (Signed)
Spoke with patient regarding paperwork. Answered questions that was listed. Pt is stating that they are now also wanting a letter from MD stating why it is necessary for her to live at [facility].

## 2019-03-26 NOTE — Telephone Encounter (Signed)
Please document from pt regarding reasons she moved into [facility].  I know that (in my last note), we have documented she has help with preparing meals, etc.  Need to know what led to her move into her current living facility.

## 2019-03-26 NOTE — Telephone Encounter (Signed)
Was the paperwork submitted?  I cannot tell by these phone calls if questions ever answered and papers submitted.  Is this why they need for information.  Need more clarification.

## 2019-03-26 NOTE — Telephone Encounter (Signed)
No, paper work has not been submitted. We have been playing phone tag to answer the questions that I needed to complete. Patient advised while I was on the phone with her that she spoke with the [facility] again last week and they advised even with the paperwork she will more than likely not qualify for pension, they will still need letter.

## 2019-03-27 ENCOUNTER — Telehealth: Payer: Self-pay | Admitting: *Deleted

## 2019-03-27 NOTE — Telephone Encounter (Signed)
Copied from [place] [id number]. Topic: General - Inquiry >> Mar 26, 2019  4:31 PM Virl Axe D wrote: Reason for CRM: Pt would like for Trish[facility] to call her regarding [facility]/paperwork. Office closed. Please advise.

## 2019-03-27 NOTE — Telephone Encounter (Signed)
See other note

## 2019-03-27 NOTE — Telephone Encounter (Signed)
Patient was under increased stress at prior facility due to living conditions, meals were not prepared, room was not cleaned for her, they came in and done frequent home inspections which lead to her being more anxious. pt needs help with preparing meals, assistance if she goes out and walks long distances, cleans her apartment, therapy that helps with strength and balance, etc. Kerri Mills has all of these and has really helped her. Pt has an appt with the [facility] on Tuesday

## 2019-03-27 NOTE — Telephone Encounter (Signed)
Letter printed, signed and placed in box.  Let me know if I need to complete (or fill in) any more on the form.

## 2019-03-28 NOTE — Telephone Encounter (Signed)
Pt aware. Will pick up today or monday

## 2019-03-29 ENCOUNTER — Other Ambulatory Visit (INDEPENDENT_AMBULATORY_CARE_PROVIDER_SITE_OTHER): Payer: Self-pay | Admitting: Vascular Surgery

## 2019-04-04 ENCOUNTER — Encounter: Payer: Self-pay | Admitting: Internal Medicine

## 2019-04-04 ENCOUNTER — Ambulatory Visit (INDEPENDENT_AMBULATORY_CARE_PROVIDER_SITE_OTHER): Payer: Medicare Other | Admitting: Internal Medicine

## 2019-04-04 DIAGNOSIS — J479 Bronchiectasis, uncomplicated: Secondary | ICD-10-CM

## 2019-04-04 DIAGNOSIS — J411 Mucopurulent chronic bronchitis: Secondary | ICD-10-CM | POA: Diagnosis not present

## 2019-04-04 NOTE — Progress Notes (Signed)
Name: Kerri Mills MRN: [id number] DOB: 1931/08/10     CONSULTATION DATE: 04/04/2019 REFERRING MD : Nicki Reaper   I connected with the patient by video/telephone enabled telemedicine visit and verified that I am speaking with the correct person using two identifiers.    I discussed the limitations, risks, security and privacy concerns of performing an evaluation and management service by telemedicine and the availability of in-person appointments. I also discussed with the patient that there may be a patient responsible charge related to this service. The patient expressed understanding and agreed to proceed.  PATIENT AGREES AND CONFIRMS -YES   Other persons participating in the visit and their role in the encounter: Patient, nursing   Patient's location: Home Provider's location: Clinic   I discussed the limitations, risks, security and privacy concerns of performing an evaluation and management service by telephone and the availability of in person appointments. I also discussed with the patient that there may be a patient responsible charge related to this service. The patient expressed understanding and agreed to proceed.  This visit type was conducted due to national recommendations for restrictions regarding the COVID-19 Pandemic (e.g. social distancing).  This format is felt to be most appropriate for this patient at this time.  All issues noted in this document were discussed and addressed.      CHIEF COMPLAINT:   Follow up Productive cough   HISTORY OF PRESENT ILLNESS: Patient has had chronic productive cough over the last 6 months Patient was prescribed flutter valve Patient has not used flutter valve over the last several weeks  She started to use flutter valve initially and it has helped her breathing Patient sounds congested at this time I have advised her to use her flutter valve more frequently   Patient has previous history of infections upper respiratory tract  infections and pneumonias in the past  Patient uses albuterol as needed and it helps  Non-smoker Secondhand smoke exposure Family history of emphysema  CT chest shows bilateral subcentimeter nodular opacities in March 2020 with bilateral bronchiectasis Images reviewed by me today     PAST MEDICAL HISTORY :   has a past medical history of Allergy, Anxiety, Arthritis, Clotting disorder ([place]), Colitis, Depression, Diverticulitis (2013), Gastric ulcer, GERD (gastroesophageal reflux disease), Hypercholesterolemia, Hypertension, Infiltrating lobular carcinoma of left breast (2011), Melanoma ([place]) (1997), Melanoma in situ of upper extremity ([place]) (03/19/2011), Persistent atrial fibrillation, Personal history of radiation therapy (2011), Seroma, Sleep apnea, and Thyroid cancer ([place]) (1992).  has a past surgical history that includes Tonsillectomy; Cholecystectomy; Melanoma excision; Thyroid surgery (1992); Partial hysterectomy; Colonoscopy (2013); Abdominal hysterectomy (1973); Breast lumpectomy (Left, 2011); Cardiac catheterization; Breast biopsy (Left, 02-13-13); Breast excisional biopsy (Left, 1995); Breast excisional biopsy (Left, 2011); and Breast biopsy (Left, 01/21/2015). Prior to Admission medications   Medication Sig Start Date End Date Taking? Authorizing Provider  albuterol (PROAIR HFA) 108 (90 Base) MCG/ACT inhaler Inhale 2 puffs into the lungs every 6 (six) hours as needed for wheezing or shortness of breath. 08/10/18   Einar Pheasant, MD  Cholecalciferol (VITAMIN D) 1000 UNITS capsule Take 1,000 Units by mouth daily.      [provider]  DULoxetine (CYMBALTA) 60 MG capsule TAKE 1 CAPSULE (60 MG TOTAL) BY MOUTH AT BEDTIME. 03/12/19   Einar Pheasant, MD  ELIQUIS 5 MG TABS tablet TAKE 1 TABLET BY MOUTH TWICE A DAY 03/11/19   Minna Merritts, MD  ferrous sulfate 325 (65 FE) MG tablet Take 325 mg by mouth every  morning.    [provider]  furosemide (LASIX) 20 MG tablet  Take 1 tablet (20 mg total) by mouth as directed. Take 1 tablet (20 mg) twice a week with potassium 10/03/18 01/01/19  Minna Merritts, MD  gabapentin (NEURONTIN) 300 MG capsule TAKE 2 CAPSULES (600 MG TOTAL) BY MOUTH AT BEDTIME. 03/06/19   Einar Pheasant, MD  levothyroxine (SYNTHROID) 88 MCG tablet TAKE ONE TABLET BY MOUTH ONCE DAILY BEFORE BREAKFAST 03/12/19   Einar Pheasant, MD  lisinopril (ZESTRIL) 20 MG tablet TAKE 1 TABLET BY MOUTH EVERY DAY 02/01/19   Einar Pheasant, MD  lovastatin (MEVACOR) 40 MG tablet TAKE 1 TABLET BY MOUTH EVERY DAY 11/23/18   Einar Pheasant, MD  Multiple Vitamins-Minerals (PRESERVISION AREDS 2) CAPS Take 1 tablet by mouth 2 (two) times daily.     [provider]  omeprazole (PRILOSEC) 20 MG capsule TAKE 1 CAPSULE (20 MG TOTAL) BY MOUTH DAILY. 11/23/18   Einar Pheasant, MD  potassium chloride (K-DUR) 10 MEQ tablet Take 2 tablets (20 mEq total) by mouth as directed. Take 2 tablets (20 mEq) twice a week with Lasix 10/03/18   Gollan, Kathlene November, MD  propranolol (INDERAL) 10 MG tablet Take 1 tablet (10 mg total) by mouth 3 (three) times daily as needed. 03/05/19   Minna Merritts, MD  triamcinolone cream (KENALOG) 0.1 % Apply 1 application topically 2 (two) times daily. 08/10/18   Einar Pheasant, MD  vitamin B-12 (CYANOCOBALAMIN) 1000 MCG tablet Take 1,000 mcg by mouth daily.    [provider]   Allergies  Allergen Reactions  . Atorvastatin     Other reaction(s): Other (See Comments) STIFFNESS AND SORE STIFFNESS AND SORE  . Lipitor [Atorvastatin Calcium] Other (See Comments)    Stiffness & soreness  . Penicillins Rash    REACTION: Unknown reaction    FAMILY HISTORY:  family history includes Cancer in her brother and sister; Heart disease in her mother. SOCIAL HISTORY:  reports that she has never smoked. She has never used smokeless tobacco. She reports current alcohol use. She reports that she does not use drugs.     Review of Systems:  Gen:   Denies  fever, sweats, chills weigh loss  HEENT: Denies blurred vision, double vision, ear pain, eye pain, hearing loss, nose bleeds, sore throat Cardiac:  No dizziness, chest pain or heaviness, chest tightness,edema, No JVD Resp: +sputum production, +cough -shortness of breath,-wheezing, -hemoptysis,  Gi: Denies swallowing difficulty, stomach pain, nausea or vomiting, diarrhea, constipation, bowel incontinence Gu:  Denies bladder incontinence, burning urine Ext:   Denies Joint pain, stiffness or swelling Skin: Denies  skin rash, easy bruising or bleeding or hives Endoc:  Denies polyuria, polydipsia , polyphagia or weight change Psych:   Denies depression, insomnia or hallucinations  Other:  All other systems negative     IMAGING        The CXR was Independently Reviewed By Me Today CXR reviewed-B/L bronchiectasis     ASSESSMENT AND PLAN SYNOPSIS  Chronic productive cough consistent with chronic bronchitis in the setting of bronchiectasis on CT chest with a previous history of pneumonia There are no signs of infection at this time No indication for antibiotics at this time  Recommend flutter valve 10-15 times per day as previously prescribed Recommend daily exercise as tolerated Albuterol as needed Follow-up CT chest in 3 months      COVID-19 EDUCATION: The signs and symptoms of COVID-19 were discussed with the patient and how to seek care  for testing.  The importance of social distancing was discussed today. Hand Washing Techniques and avoid touching face was advised.  MEDICATION ADJUSTMENTS/LABS AND TESTS ORDERED: Continue Flutter Valve 10-15 times per day Albuterol as needed CT chest in 3 months  CURRENT MEDICATIONS REVIEWED AT LENGTH WITH PATIENT TODAY   Patient satisfied with Plan of action and management. All questions answered  Follow up in 3 months   Total time Spent 26 minutes  Maretta Bees Patricia Pesa, M.D.  [facility]  Medical Director [place] Director [facility] Cardio-Pulmonary Department

## 2019-04-04 NOTE — Patient Instructions (Signed)
Continue flutter valve 10 -15 times daily

## 2019-04-05 ENCOUNTER — Ambulatory Visit: Payer: Medicare Other | Admitting: Internal Medicine

## 2019-04-05 ENCOUNTER — Other Ambulatory Visit: Payer: Self-pay

## 2019-04-05 ENCOUNTER — Ambulatory Visit (INDEPENDENT_AMBULATORY_CARE_PROVIDER_SITE_OTHER): Payer: Medicare Other | Admitting: Vascular Surgery

## 2019-04-05 ENCOUNTER — Encounter (INDEPENDENT_AMBULATORY_CARE_PROVIDER_SITE_OTHER): Payer: Self-pay | Admitting: Vascular Surgery

## 2019-04-05 VITALS — BP 145/79 | HR 69 | Resp 16 | Wt 179.0 lb

## 2019-04-05 DIAGNOSIS — I8312 Varicose veins of left lower extremity with inflammation: Secondary | ICD-10-CM

## 2019-04-05 DIAGNOSIS — I8311 Varicose veins of right lower extremity with inflammation: Secondary | ICD-10-CM

## 2019-04-05 NOTE — Progress Notes (Signed)
Kerri Mills is a 85 y.o. female who presents with symptomatic venous reflux  Past Medical History:  Diagnosis Date  . Allergy   . Anxiety   . Arthritis   . Clotting disorder ([place])   . Colitis   . Depression   . Diverticulitis 2013  . Gastric ulcer   . GERD (gastroesophageal reflux disease)   . Hypercholesterolemia   . Hypertension   . Infiltrating lobular carcinoma of left breast 2011   T2,N0, ER: 90%; PR 0%; Her 2 neu not amplified. [facility]).  . Melanoma ([place]) 1997  . Melanoma in situ of upper extremity ([place]) 03/19/2011  . Persistent atrial fibrillation    a. CHADS2VASc => 4 (HTN, age x 2, female)  . Personal history of radiation therapy 2011   BREAST CA  . Seroma    HISTORY OF LFT BREAST  . Sleep apnea   . Thyroid cancer ([place]) 1992    Past Surgical History:  Procedure Laterality Date  . ABDOMINAL HYSTERECTOMY  1973   partial  . BREAST BIOPSY Left 02-13-13   BENIGN BREAST TISSUE WITH CHANGES CONSISTENT WITH FAT NECROSIS  . BREAST BIOPSY Left 01/21/2015   bx done in [place] office 11:00 left 6-8cmfn  . BREAST EXCISIONAL BIOPSY Left 1995   neg  . BREAST EXCISIONAL BIOPSY Left 2011   Breast cancer radiation  . BREAST LUMPECTOMY Left 2011   BREAST CA  . CARDIAC CATHETERIZATION    . CHOLECYSTECTOMY    . COLONOSCOPY  2013  . MELANOMA EXCISION     RT UPPER ARM  . PARTIAL HYSTERECTOMY     bleeding, ovaries in place.    . THYROID SURGERY  1992   FOR THYROID CANCER  . TONSILLECTOMY       Current Outpatient Medications:  .  albuterol (PROAIR HFA) 108 (90 Base) MCG/ACT inhaler, Inhale 2 puffs into the lungs every 6 (six) hours as needed for wheezing or shortness of breath., Disp: 1 Inhaler, Rfl: 3 .  albuterol (VENTOLIN HFA) 108 (90 Base) MCG/ACT inhaler, Inhale 2 puffs into the lungs every 4 (four) hours as needed for wheezing or shortness of breath., Disp: 1 g, Rfl: 10 .  ALPRAZolam (XANAX) 0.5 MG tablet, TAKE 1 ST TABLET BY MOUTH ONE HOUR PRIOR TO  PROCEDURE. TAKE 2 ND TABLET UPON ARRIVAL, Disp: 2 tablet, Rfl: 0 .  Cholecalciferol (VITAMIN D) 1000 UNITS capsule, Take 1,000 Units by mouth daily.  , Disp: , Rfl:  .  DULoxetine (CYMBALTA) 60 MG capsule, TAKE 1 CAPSULE (60 MG TOTAL) BY MOUTH AT BEDTIME., Disp: 90 capsule, Rfl: 1 .  ELIQUIS 5 MG TABS tablet, TAKE 1 TABLET BY MOUTH TWICE A DAY, Disp: 180 tablet, Rfl: 1 .  ferrous sulfate 325 (65 FE) MG tablet, Take 325 mg by mouth every morning., Disp: , Rfl:  .  gabapentin (NEURONTIN) 300 MG capsule, TAKE 2 CAPSULES (600 MG TOTAL) BY MOUTH AT BEDTIME., Disp: 180 capsule, Rfl: 1 .  levothyroxine (SYNTHROID) 88 MCG tablet, TAKE ONE TABLET BY MOUTH ONCE DAILY BEFORE BREAKFAST, Disp: 90 tablet, Rfl: 1 .  lisinopril (ZESTRIL) 20 MG tablet, TAKE 1 TABLET BY MOUTH EVERY DAY, Disp: 30 tablet, Rfl: 5 .  lovastatin (MEVACOR) 40 MG tablet, TAKE 1 TABLET BY MOUTH EVERY DAY, Disp: 90 tablet, Rfl: 1 .  Multiple Vitamins-Minerals (PRESERVISION AREDS 2) CAPS, Take 1 tablet by mouth 2 (two) times daily. , Disp: , Rfl:  .  omeprazole (PRILOSEC) 20 MG capsule, TAKE 1 CAPSULE (  20 MG TOTAL) BY MOUTH DAILY., Disp: 90 capsule, Rfl: 3 .  potassium chloride (K-DUR) 10 MEQ tablet, Take 2 tablets (20 mEq total) by mouth as directed. Take 2 tablets (20 mEq) twice a week with Lasix, Disp: 48 tablet, Rfl: 3 .  propranolol (INDERAL) 10 MG tablet, Take 1 tablet (10 mg total) by mouth 3 (three) times daily as needed., Disp: 270 tablet, Rfl: 3 .  triamcinolone cream (KENALOG) 0.1 %, Apply 1 application topically 2 (two) times daily., Disp: 30 g, Rfl: 0 .  vitamin B-12 (CYANOCOBALAMIN) 1000 MCG tablet, Take 1,000 mcg by mouth daily., Disp: , Rfl:  .  furosemide (LASIX) 20 MG tablet, Take 1 tablet (20 mg total) by mouth as directed. Take 1 tablet (20 mg) twice a week with potassium, Disp: 24 tablet, Rfl: 3  Current Facility-Administered Medications:  .  albuterol (PROVENTIL) (2.5 MG/3ML) 0.083% nebulizer solution 2.5 mg, 2.5 mg,  Nebulization, Once, Guse, Jacquelynn Cree, FNP  Allergies  Allergen Reactions  . Atorvastatin     Other reaction(s): Other (See Comments) STIFFNESS AND SORE STIFFNESS AND SORE  . Lipitor [Atorvastatin Calcium] Other (See Comments)    Stiffness & soreness  . Penicillins Rash    REACTION: Unknown reaction     Varicose veins of both lower extremities with inflammation     PLAN: The patient's right lower extremity was sterilely prepped and draped. The ultrasound machine was used to visualize the saphenous vein throughout its course. A segment in the lower thigh was selected for access. The saphenous vein was accessed without difficulty using ultrasound guidance with a micropuncture needle. A 0.018 wire was then placed beyond the saphenofemoral junction and the needle was removed. The 65 cm sheath was then placed over the wire and the wire and dilator were removed. The laser fiber was then placed through the sheath and its tip was placed approximately 4-5 centimeters below the saphenofemoral junction. Tumescent anesthesia was then created with a dilute lidocaine solution. Laser energy was then delivered with constant withdrawal of the sheath and laser fiber. Approximately 872 joules of energy were delivered over a length of 24 centimeters using a 1470 Hz VenaCure machine at 7 W. Sterile dressings were placed. The patient tolerated the procedure well without obvious complications.   Follow-up in 1 week with post-laser duplex.

## 2019-04-08 ENCOUNTER — Other Ambulatory Visit (INDEPENDENT_AMBULATORY_CARE_PROVIDER_SITE_OTHER): Payer: Self-pay | Admitting: Vascular Surgery

## 2019-04-08 ENCOUNTER — Other Ambulatory Visit: Payer: Self-pay

## 2019-04-08 ENCOUNTER — Ambulatory Visit (INDEPENDENT_AMBULATORY_CARE_PROVIDER_SITE_OTHER): Payer: Medicare Other

## 2019-04-08 DIAGNOSIS — Z09 Encounter for follow-up examination after completed treatment for conditions other than malignant neoplasm: Secondary | ICD-10-CM | POA: Diagnosis not present

## 2019-04-16 ENCOUNTER — Encounter: Payer: Self-pay | Admitting: General Surgery

## 2019-04-19 ENCOUNTER — Ambulatory Visit (INDEPENDENT_AMBULATORY_CARE_PROVIDER_SITE_OTHER): Payer: Medicare Other | Admitting: Vascular Surgery

## 2019-04-19 ENCOUNTER — Encounter (INDEPENDENT_AMBULATORY_CARE_PROVIDER_SITE_OTHER): Payer: Self-pay

## 2019-04-19 ENCOUNTER — Encounter (INDEPENDENT_AMBULATORY_CARE_PROVIDER_SITE_OTHER): Payer: Self-pay | Admitting: Vascular Surgery

## 2019-04-19 ENCOUNTER — Other Ambulatory Visit: Payer: Self-pay

## 2019-04-19 VITALS — BP 150/81 | HR 84 | Resp 10 | Ht 65.0 in | Wt 177.0 lb

## 2019-04-19 DIAGNOSIS — Z9889 Other specified postprocedural states: Secondary | ICD-10-CM | POA: Diagnosis not present

## 2019-04-19 DIAGNOSIS — I4819 Other persistent atrial fibrillation: Secondary | ICD-10-CM

## 2019-04-19 DIAGNOSIS — Z79899 Other long term (current) drug therapy: Secondary | ICD-10-CM

## 2019-04-19 DIAGNOSIS — I8312 Varicose veins of left lower extremity with inflammation: Secondary | ICD-10-CM

## 2019-04-19 DIAGNOSIS — I10 Essential (primary) hypertension: Secondary | ICD-10-CM | POA: Diagnosis not present

## 2019-04-19 DIAGNOSIS — I8311 Varicose veins of right lower extremity with inflammation: Secondary | ICD-10-CM

## 2019-04-19 DIAGNOSIS — Z7901 Long term (current) use of anticoagulants: Secondary | ICD-10-CM

## 2019-04-19 NOTE — Assessment & Plan Note (Signed)
Patient has had good results from laser ablation in the right leg and foam sclerotherapy on the left leg.  She is pleased with the results.  We discussed that sclerotherapy can performed to treat any residual symptomatic varicosities in the future, but it current she does not feel like that is necessary which is certainly reasonable.  I will plan to see her back in about 6 months.

## 2019-04-19 NOTE — Assessment & Plan Note (Signed)
On Eliquis, rate controlled

## 2019-04-19 NOTE — Assessment & Plan Note (Signed)
blood pressure control important in reducing the progression of atherosclerotic disease. On appropriate oral medications.  

## 2019-04-19 NOTE — Progress Notes (Signed)
MRN : [id number]  Kerri Mills is a 85 y.o. (07/07/1931) female who presents with chief complaint of  Chief Complaint  Patient presents with  . Follow-up  .  History of Present Illness: Patient returns today in follow up of after venous treatments to both lower extremities.  Her left leg underwent foam sclerotherapy as it was found her main great saphenous vein remained ablated from her previous intervention.  Her right great saphenous vein was recently treated with laser ablation.  She has done well.  The bruising and soreness has largely resolved although she has some mild residual soreness in the right medial thigh and knee area.  Her most recent procedure was 2 weeks ago on that right leg.  Her postprocedural duplex showed successful ablation without DVT.  Overall, her legs are a little less painful and swelling less.  She is pleased with the results.  Current Outpatient Medications  Medication Sig Dispense Refill  . albuterol (PROAIR HFA) 108 (90 Base) MCG/ACT inhaler Inhale 2 puffs into the lungs every 6 (six) hours as needed for wheezing or shortness of breath. 1 Inhaler 3  . albuterol (VENTOLIN HFA) 108 (90 Base) MCG/ACT inhaler Inhale 2 puffs into the lungs every 4 (four) hours as needed for wheezing or shortness of breath. 1 g 10  . ALPRAZolam (XANAX) 0.5 MG tablet TAKE 1 ST TABLET BY MOUTH ONE HOUR PRIOR TO PROCEDURE. TAKE 2 ND TABLET UPON ARRIVAL 2 tablet 0  . Cholecalciferol (VITAMIN D) 1000 UNITS capsule Take 1,000 Units by mouth daily.      . DULoxetine (CYMBALTA) 60 MG capsule TAKE 1 CAPSULE (60 MG TOTAL) BY MOUTH AT BEDTIME. 90 capsule 1  . ELIQUIS 5 MG TABS tablet TAKE 1 TABLET BY MOUTH TWICE A DAY 180 tablet 1  . ferrous sulfate 325 (65 FE) MG tablet Take 325 mg by mouth every morning.    . gabapentin (NEURONTIN) 300 MG capsule TAKE 2 CAPSULES (600 MG TOTAL) BY MOUTH AT BEDTIME. 180 capsule 1  . levothyroxine (SYNTHROID) 88 MCG tablet TAKE ONE TABLET BY MOUTH ONCE DAILY  BEFORE BREAKFAST 90 tablet 1  . lisinopril (ZESTRIL) 20 MG tablet TAKE 1 TABLET BY MOUTH EVERY DAY 30 tablet 5  . lovastatin (MEVACOR) 40 MG tablet TAKE 1 TABLET BY MOUTH EVERY DAY 90 tablet 1  . Multiple Vitamins-Minerals (PRESERVISION AREDS 2) CAPS Take 1 tablet by mouth 2 (two) times daily.     Marland Kitchen omeprazole (PRILOSEC) 20 MG capsule TAKE 1 CAPSULE (20 MG TOTAL) BY MOUTH DAILY. 90 capsule 3  . potassium chloride (K-DUR) 10 MEQ tablet Take 2 tablets (20 mEq total) by mouth as directed. Take 2 tablets (20 mEq) twice a week with Lasix 48 tablet 3  . propranolol (INDERAL) 10 MG tablet Take 1 tablet (10 mg total) by mouth 3 (three) times daily as needed. 270 tablet 3  . triamcinolone cream (KENALOG) 0.1 % Apply 1 application topically 2 (two) times daily. 30 g 0  . vitamin B-12 (CYANOCOBALAMIN) 1000 MCG tablet Take 1,000 mcg by mouth daily.    . furosemide (LASIX) 20 MG tablet Take 1 tablet (20 mg total) by mouth as directed. Take 1 tablet (20 mg) twice a week with potassium 24 tablet 3   Current Facility-Administered Medications  Medication Dose Route Frequency Provider Last Rate Last Dose  . albuterol (PROVENTIL) (2.5 MG/3ML) 0.083% nebulizer solution 2.5 mg  2.5 mg Nebulization Once Guse, Jacquelynn Cree, FNP  Past Medical History:  Diagnosis Date  . Allergy   . Anxiety   . Arthritis   . Clotting disorder ([place])   . Colitis   . Depression   . Diverticulitis 2013  . Gastric ulcer   . GERD (gastroesophageal reflux disease)   . Hypercholesterolemia   . Hypertension   . Infiltrating lobular carcinoma of left breast 2011   T2,N0, ER: 90%; PR 0%; Her 2 neu not amplified. [facility]).  . Melanoma ([place]) 1997  . Melanoma in situ of upper extremity ([place]) 03/19/2011  . Persistent atrial fibrillation    a. CHADS2VASc => 4 (HTN, age x 2, female)  . Personal history of radiation therapy 2011   BREAST CA  . Seroma    HISTORY OF LFT BREAST  . Sleep apnea   . Thyroid cancer ([place]) 1992     Past Surgical History:  Procedure Laterality Date  . ABDOMINAL HYSTERECTOMY  1973   partial  . BREAST BIOPSY Left 02-13-13   BENIGN BREAST TISSUE WITH CHANGES CONSISTENT WITH FAT NECROSIS  . BREAST BIOPSY Left 01/21/2015   bx done in [facility] office 11:00 left 6-8cmfn  . BREAST EXCISIONAL BIOPSY Left 1995   neg  . BREAST EXCISIONAL BIOPSY Left 2011   Breast cancer radiation  . BREAST LUMPECTOMY Left 2011   BREAST CA  . CARDIAC CATHETERIZATION    . CHOLECYSTECTOMY    . COLONOSCOPY  2013  . MELANOMA EXCISION     RT UPPER ARM  . PARTIAL HYSTERECTOMY     bleeding, ovaries in place.    . THYROID SURGERY  1992   FOR THYROID CANCER  . TONSILLECTOMY      Social History Social History   Tobacco Use  . Smoking status: Never Smoker  . Smokeless tobacco: Never Used  Substance Use Topics  . Alcohol use: Yes    Alcohol/week: 0.0 standard drinks    Comment: social drinking. average times a week  . Drug use: No     Family History Family History  Problem Relation Age of Onset  . Heart disease Mother   . Cancer Brother        lung   . Cancer Sister        breast  . Breast cancer Neg Hx      Allergies  Allergen Reactions  . Atorvastatin     Other reaction(s): Other (See Comments) STIFFNESS AND SORE STIFFNESS AND SORE  . Lipitor [Atorvastatin Calcium] Other (See Comments)    Stiffness & soreness  . Penicillins Rash    REACTION: Unknown reaction     REVIEW OF SYSTEMS (Negative unless checked)  Constitutional: [] Weight loss  [] Fever  [] Chills Cardiac: [] Chest pain   [] Chest pressure   [] Palpitations   [] Shortness of breath when laying flat   [] Shortness of breath at rest   [] Shortness of breath with exertion. Vascular:  [] Pain in legs with walking   [] Pain in legs at rest   [] Pain in legs when laying flat   [] Claudication   [] Pain in feet when walking  [] Pain in feet at rest  [] Pain in feet when laying flat   [x] History of DVT   [] Phlebitis   [x] Swelling in legs    [x] Varicose veins   [] Non-healing ulcers Pulmonary:   [] Uses home oxygen   [] Productive cough   [] Hemoptysis   [] Wheeze  [] COPD   [] Asthma Neurologic:  [] Dizziness  [] Blackouts   [] Seizures   [] History of stroke   [] History of TIA  []   Aphasia   [] Temporary blindness   [] Dysphagia   [] Weakness or numbness in arms   [] Weakness or numbness in legs Musculoskeletal:  [x] Arthritis   [] Joint swelling   [x] Joint pain   [] Low back pain Hematologic:  [] Easy bruising  [] Easy bleeding   [] Hypercoagulable state   [] Anemic   Gastrointestinal:  [] Blood in stool   [] Vomiting blood  [x] Gastroesophageal reflux/heartburn   [] Abdominal pain Genitourinary:  [] Chronic kidney disease   [] Difficult urination  [] Frequent urination  [] Burning with urination   [] Hematuria Skin:  [] Rashes   [] Ulcers   [] Wounds Psychological:  [] History of anxiety   []  History of major depression.  Physical Examination  BP (!) 150/81 (BP Location: Right Arm, Patient Position: Sitting, Cuff Size: Large)   Pulse 84   Resp 10   Ht 5\' 5"  (1.651 m)   Wt 177 lb (80.3 kg)   BMI 29.45 kg/m  Gen:  WD/WN, NAD.  Appears younger than stated age Head: [place]/AT, No temporalis wasting. Ear/Nose/Throat: Hearing grossly intact, nares w/o erythema or drainage Eyes: Conjunctiva clear. Sclera non-icteric Neck: Supple.  Trachea midline Pulmonary:  Good air movement, no use of accessory muscles.  Cardiac: irregular Vascular:  Vessel Right Left  Radial Palpable Palpable                          PT 1+ Palpable 1+ Palpable  DP Palpable Palpable   Gastrointestinal: soft, non-tender/non-distended. No guarding/reflex.  Musculoskeletal: M/S 5/5 throughout.  No deformity or atrophy.  Trace right lower extremity edema, 1+ left lower extremity edema. Neurologic: Sensation grossly intact in extremities.  Symmetrical.  Speech is fluent.  Psychiatric: Judgment intact, Mood & affect appropriate for pt's clinical situation. Dermatologic: No rashes or ulcers  noted.  No cellulitis or open wounds.       Labs Recent Results (from the past 2160 hour(s))  Novel Coronavirus, NAA ([facility])     Status: None   Collection Time: 03/15/19  8:03 AM  Result Value Ref Range   SARS-CoV-2, NAA Not Detected Not Detected    Comment: This test was developed and its performance characteristics determined by [employer]. This test has not been FDA cleared or approved. This test has been authorized by FDA under an Emergency Use Authorization (EUA). This test is only authorized for the duration of time the declaration that circumstances exist justifying the authorization of the emergency use of in vitro diagnostic tests for detection of SARS-CoV-2 virus and/or diagnosis of COVID-19 infection under section 564(b)(1) of the Act, 21 U.S.C. 169CVE-9(F)(8), unless the authorization is terminated or revoked sooner. When diagnostic testing is negative, the possibility of a false negative result should be considered in the context of a patient's recent exposures and the presence of clinical signs and symptoms consistent with COVID-19. An individual without symptoms of COVID-19 and who is not shedding SARS-CoV-2 virus would expect to have a negative (not detected) result in this assay.     Radiology Vas [place] Lower Ext Venous Post Ablation  Result Date: 04/08/2019  Lower Venous Reflux Study Indications: Post-op, and Ablation.  Performing Technologist: Almira Coaster RVS  Examination Guidelines: A complete evaluation includes B-mode imaging, spectral Doppler, color Doppler, and power Doppler as needed of all accessible portions of each vessel. Bilateral testing is considered an integral part of a complete examination. Limited examinations for reoccurring indications may be performed as noted. The reflux portion of the exam is performed with the patient in reverse Trendelenburg.  +---------+---------------+---------+-----------+----------+-------+  RIGHT     CompressibilityPhasicitySpontaneityPropertiesSummary +---------+---------------+---------+-----------+----------+-------+ CFV      Full           Yes      Yes                          +---------+---------------+---------+-----------+----------+-------+ SFJ      Full           Yes      Yes                          +---------+---------------+---------+-----------+----------+-------+ FV Prox  Full           Yes      Yes                          +---------+---------------+---------+-----------+----------+-------+ FV Mid   Full           Yes      Yes                          +---------+---------------+---------+-----------+----------+-------+ FV DistalFull           Yes      Yes                          +---------+---------------+---------+-----------+----------+-------+ PFV      Full           Yes      Yes                          +---------+---------------+---------+-----------+----------+-------+ GSV      Full           Yes      Yes                          +---------+---------------+---------+-----------+----------+-------+     Summary: Right: There is no evidence of deep vein thrombosis in the lower extremity.There is no evidence of superficial venous thrombosis. Post Ablation of the Rt Lower Extremity appears tro be successful; closure seen from the level of the Knee to approximately  *See table(s) above for measurements and observations. Electronically signed by Hortencia Pilar MD on 04/08/2019 at 4:21:34 PM.    Final     Assessment/Plan  Essential hypertension blood pressure control important in reducing the progression of atherosclerotic disease. On appropriate oral medications.   Atrial fibrillation On Eliquis, rate controlled  Varicose veins of both lower extremities with inflammation Patient has had good results from laser ablation in the right leg and foam sclerotherapy on the left leg.  She is pleased with the results.  We discussed that  sclerotherapy can performed to treat any residual symptomatic varicosities in the future, but it current she does not feel like that is necessary which is certainly reasonable.  I will plan to see her back in about 6 months.    Leotis Pain, MD  04/19/2019 1:56 PM    This note was created with Dragon medical transcription system.  Any errors from dictation are purely unintentional

## 2019-04-22 DIAGNOSIS — H353221 Exudative age-related macular degeneration, left eye, with active choroidal neovascularization: Secondary | ICD-10-CM | POA: Diagnosis not present

## 2019-04-26 ENCOUNTER — Telehealth: Payer: Self-pay | Admitting: *Deleted

## 2019-04-26 NOTE — Telephone Encounter (Signed)
Hold until receive.

## 2019-04-26 NOTE — Telephone Encounter (Signed)
Copied from [place] [id number]. Topic: General - Other >> Apr 26, 2019  2:36 PM Sheran Luz wrote: Patient requesting call from [place] regarding "form". Patient would not disclose any other information, just that she needs to speak with [place].

## 2019-04-26 NOTE — Telephone Encounter (Signed)
Patient say she is working with [employer] Kerri Mills would you tansfer the paperwork that was filled out early to this paperwork they think they can help her with Benefits from [place]. Patient will bring paper work on Monday.

## 2019-05-01 NOTE — Telephone Encounter (Signed)
Pt is returning [place] call

## 2019-05-01 NOTE — Telephone Encounter (Signed)
Patient is locked down due to COVID, and cannot bring paperwork , she is going to fax it over to Dr. Nicki Reaper. Before faxing back to [facility], please speak with patient.  [employer] suggested she will need someone to sit with her during showers, and it can be someone that is a member of family or friend. Also please whomever transfers information to paper to call her before completing so she can advise help needed . Patient will fax today to facility and please fax back to patient she will have fax number on paperwork.

## 2019-05-01 NOTE — Telephone Encounter (Signed)
Holding until received.

## 2019-05-03 ENCOUNTER — Ambulatory Visit (INDEPENDENT_AMBULATORY_CARE_PROVIDER_SITE_OTHER): Payer: Medicare Other | Admitting: Nurse Practitioner

## 2019-05-06 NOTE — Telephone Encounter (Signed)
Called patient to have her send me the new paperwork so we can complete. I received the old paperwork that we filled out originally

## 2019-05-07 ENCOUNTER — Ambulatory Visit (INDEPENDENT_AMBULATORY_CARE_PROVIDER_SITE_OTHER): Payer: Medicare Other | Admitting: Nurse Practitioner

## 2019-05-07 ENCOUNTER — Encounter (INDEPENDENT_AMBULATORY_CARE_PROVIDER_SITE_OTHER): Payer: Self-pay | Admitting: Nurse Practitioner

## 2019-05-07 ENCOUNTER — Other Ambulatory Visit: Payer: Self-pay

## 2019-05-07 VITALS — BP 165/74 | HR 88 | Resp 16 | Ht 65.0 in | Wt 177.0 lb

## 2019-05-07 DIAGNOSIS — I8312 Varicose veins of left lower extremity with inflammation: Secondary | ICD-10-CM | POA: Diagnosis not present

## 2019-05-07 DIAGNOSIS — I10 Essential (primary) hypertension: Secondary | ICD-10-CM | POA: Diagnosis not present

## 2019-05-07 DIAGNOSIS — I8311 Varicose veins of right lower extremity with inflammation: Secondary | ICD-10-CM

## 2019-05-07 DIAGNOSIS — M170 Bilateral primary osteoarthritis of knee: Secondary | ICD-10-CM

## 2019-05-07 NOTE — Progress Notes (Signed)
SUBJECTIVE:  Patient ID: Kerri Mills, female    DOB: 13-Aug-1931, 85 y.o.   MRN: [id number] Chief Complaint  Patient presents with  . Follow-up    post laser follow up    HPI  Kerri Mills is a 85 y.o. female The patient returns to the office for followup status post laser ablation of the right great saphenous vein on 04/05/2019.  The patient was also treated with foam sclerotherapy on 03/22/2019 of the left lower extremity.  The patient has had her left great saphenous vein ablated previously.  The patient notes multiple residual varicosities bilaterally which continued to hurt with dependent positions and remained tender to palpation. The patient's swelling is unchanged from preoperative status. The patient continues to wear graduated compression stockings on a daily basis but these are not eliminating the pain and discomfort.  The patient also complains of intense itching of the the patient continues to use over-the-counter anti-inflammatory medications to treat the pain and related symptoms but this has not given the patient relief. The patient notes the pain in the lower extremities is causing problems with daily exercise, problems at work and even with household activities such as preparing meals and doing dishes.  The patient is otherwise done well and there have been no complications related to the laser procedure or interval changes in the patient's overall   Venous ultrasound post laser shows successful laser ablation of the right lower extremity, no DVT identified.  Past Medical History:  Diagnosis Date  . Allergy   . Anxiety   . Arthritis   . Clotting disorder ([place])   . Colitis   . Depression   . Diverticulitis 2013  . Gastric ulcer   . GERD (gastroesophageal reflux disease)   . Hypercholesterolemia   . Hypertension   . Infiltrating lobular carcinoma of left breast 2011   T2,N0, ER: 90%; PR 0%; Her 2 neu not amplified. [facility]).  . Melanoma ([place]) 1997  .  Melanoma in situ of upper extremity ([place]) 03/19/2011  . Persistent atrial fibrillation    a. CHADS2VASc => 4 (HTN, age x 2, female)  . Personal history of radiation therapy 2011   BREAST CA  . Seroma    HISTORY OF LFT BREAST  . Sleep apnea   . Thyroid cancer ([place]) 1992    Past Surgical History:  Procedure Laterality Date  . ABDOMINAL HYSTERECTOMY  1973   partial  . BREAST BIOPSY Left 02-13-13   BENIGN BREAST TISSUE WITH CHANGES CONSISTENT WITH FAT NECROSIS  . BREAST BIOPSY Left 01/21/2015   bx done in [facility] 11:00 left 6-8cmfn  . BREAST EXCISIONAL BIOPSY Left 1995   neg  . BREAST EXCISIONAL BIOPSY Left 2011   Breast cancer radiation  . BREAST LUMPECTOMY Left 2011   BREAST CA  . CARDIAC CATHETERIZATION    . CHOLECYSTECTOMY    . COLONOSCOPY  2013  . MELANOMA EXCISION     RT UPPER ARM  . PARTIAL HYSTERECTOMY     bleeding, ovaries in place.    . THYROID SURGERY  1992   FOR THYROID CANCER  . TONSILLECTOMY      Social History   Socioeconomic History  . Marital status: Widowed    Spouse name: Not on file  . Number of children: Not on file  . Years of education: Not on file  . Highest education level: Not on file  Occupational History  . Not on file  Social Needs  . Financial  resource strain: Not hard at all  . Food insecurity    Worry: Never true    Inability: Never true  . Transportation needs    Medical: No    Non-medical: No  Tobacco Use  . Smoking status: Never Smoker  . Smokeless tobacco: Never Used  Substance and Sexual Activity  . Alcohol use: Yes    Alcohol/week: 0.0 standard drinks    Comment: social drinking. average times a week  . Drug use: No  . Sexual activity: Never  Lifestyle  . Physical activity    Days per week: 7 days    Minutes per session: 40 min  . Stress: Not at all  Relationships  . Social Herbalist on phone: Not on file    Gets together: Not on file    Attends religious service: Not on file    Active member of  club or organization: Not on file    Attends meetings of clubs or organizations: Not on file    Relationship status: Widowed  . Intimate partner violence    Fear of current or ex partner: Not on file    Emotionally abused: Not on file    Physically abused: Not on file    Forced sexual activity: Not on file  Other Topics Concern  . Not on file  Social History Narrative   Independent and baseline. Lives by herself    Family History  Problem Relation Age of Onset  . Heart disease Mother   . Cancer Brother        lung   . Cancer Sister        breast  . Breast cancer Neg Hx     Allergies  Allergen Reactions  . Atorvastatin     Other reaction(s): Other (See Comments) STIFFNESS AND SORE STIFFNESS AND SORE  . Lipitor [Atorvastatin Calcium] Other (See Comments)    Stiffness & soreness  . Penicillins Rash    REACTION: Unknown reaction     Review of Systems   Review of Systems: Negative Unless Checked Constitutional: [] Weight loss  [] Fever  [] Chills Cardiac: [] Chest pain   []  Atrial Fibrillation  [] Palpitations   [] Shortness of breath when laying flat   [] Shortness of breath with exertion. [] Shortness of breath at rest Vascular:  [] Pain in legs with walking   [] Pain in legs with standing [] Pain in legs when laying flat   [] Claudication    [] Pain in feet when laying flat    [] History of DVT   [] Phlebitis   [] Swelling in legs   [] Varicose veins   [] Non-healing ulcers Pulmonary:   [] Uses home oxygen   [] Productive cough   [] Hemoptysis   [] Wheeze  [] COPD   [] Asthma Neurologic:  [] Dizziness   [] Seizures  [] Blackouts [] History of stroke   [] History of TIA  [] Aphasia   [] Temporary Blindness   [] Weakness or numbness in arm   [] Weakness or numbness in leg Musculoskeletal:   [] Joint swelling   [] Joint pain   [] Low back pain  []  History of Knee Replacement [] Arthritis [] back Surgeries  []  Spinal Stenosis    Hematologic:  [] Easy bruising  [] Easy bleeding   [] Hypercoagulable state   [] Anemic  Gastrointestinal:  [] Diarrhea   [] Vomiting  [] Gastroesophageal reflux/heartburn   [] Difficulty swallowing. [] Abdominal pain Genitourinary:  [] Chronic kidney disease   [] Difficult urination  [] Anuric   [] Blood in urine [] Frequent urination  [] Burning with urination   [] Hematuria Skin:  [] Rashes   [] Ulcers [] Wounds Psychological:  [] History of anxiety   []   History of major depression  []  Memory Difficulties      OBJECTIVE:   Physical Exam  BP (!) 165/74 (BP Location: Right Arm)   Pulse 88   Resp 16   Ht 5\' 5"  (1.651 m)   Wt 177 lb (80.3 kg)   BMI 29.45 kg/m   Gen: WD/WN, NAD Head: [place]/AT, No temporalis wasting.  Ear/Nose/Throat: Hearing grossly intact, nares w/o erythema or drainage Eyes: PER, EOMI, sclera nonicteric.  Neck: Supple, no masses.  No JVD.  Pulmonary:  Good air movement, no use of accessory muscles.  Cardiac: RRR Vascular:  2+ soft edema bilaterally.  Scattered spider veins bilaterally. Vessel Right Left  Radial Palpable Palpable  Dorsalis Pedis Palpable Palpable  Posterior Tibial Palpable Palpable   Gastrointestinal: soft, non-distended. No guarding/no peritoneal signs.  Musculoskeletal: M/S 5/5 throughout.  No deformity or atrophy.  Neurologic: Pain and light touch intact in extremities.  Symmetrical.  Speech is fluent. Motor exam as listed above. Psychiatric: Judgment intact, Mood & affect appropriate for pt's clinical situation. Dermatologic: No Venous rashes. No Ulcers Noted.  No changes consistent with cellulitis. Lymph : No Cervical lymphadenopathy, no lichenification or skin changes of chronic lymphedema.       ASSESSMENT AND PLAN:  1. Varicose veins of both lower extremities with inflammation Recommend:  The patient has had successful ablation of the previously incompetent saphenous venous system but still has persistent symptoms of pain and swelling that are having a negative impact on daily life and daily activities.  Patient should undergo  injection sclerotherapy to treat the residual varicosities.  The risks, benefits and alternative therapies were reviewed in detail with the patient.  All questions were answered.  The patient agrees to proceed with sclerotherapy at their convenience.  The patient will continue wearing the graduated compression stockings and using the over-the-counter pain medications to treat her symptoms.       2. Essential hypertension Continue antihypertensive medications as already ordered, these medications have been reviewed and there are no changes at this time.   3. Osteoarthritis of both knees, unspecified osteoarthritis type Continue NSAID medications as already ordered, these medications have been reviewed and there are no changes at this time.  Continued activity and therapy was stressed.    Current Outpatient Medications on File Prior to Visit  Medication Sig Dispense Refill  . albuterol (PROAIR HFA) 108 (90 Base) MCG/ACT inhaler Inhale 2 puffs into the lungs every 6 (six) hours as needed for wheezing or shortness of breath. 1 Inhaler 3  . albuterol (VENTOLIN HFA) 108 (90 Base) MCG/ACT inhaler Inhale 2 puffs into the lungs every 4 (four) hours as needed for wheezing or shortness of breath. 1 g 10  . ALPRAZolam (XANAX) 0.5 MG tablet TAKE 1 ST TABLET BY MOUTH ONE HOUR PRIOR TO PROCEDURE. TAKE 2 ND TABLET UPON ARRIVAL 2 tablet 0  . Cholecalciferol (VITAMIN D) 1000 UNITS capsule Take 1,000 Units by mouth daily.      . DULoxetine (CYMBALTA) 60 MG capsule TAKE 1 CAPSULE (60 MG TOTAL) BY MOUTH AT BEDTIME. 90 capsule 1  . ELIQUIS 5 MG TABS tablet TAKE 1 TABLET BY MOUTH TWICE A DAY 180 tablet 1  . ferrous sulfate 325 (65 FE) MG tablet Take 325 mg by mouth every morning.    . gabapentin (NEURONTIN) 300 MG capsule TAKE 2 CAPSULES (600 MG TOTAL) BY MOUTH AT BEDTIME. 180 capsule 1  . levothyroxine (SYNTHROID) 88 MCG tablet TAKE ONE TABLET BY MOUTH ONCE DAILY BEFORE BREAKFAST  90 tablet 1  . lisinopril  (ZESTRIL) 20 MG tablet TAKE 1 TABLET BY MOUTH EVERY DAY 30 tablet 5  . lovastatin (MEVACOR) 40 MG tablet TAKE 1 TABLET BY MOUTH EVERY DAY 90 tablet 1  . Multiple Vitamins-Minerals (PRESERVISION AREDS 2) CAPS Take 1 tablet by mouth 2 (two) times daily.     Marland Kitchen omeprazole (PRILOSEC) 20 MG capsule TAKE 1 CAPSULE (20 MG TOTAL) BY MOUTH DAILY. 90 capsule 3  . potassium chloride (K-DUR) 10 MEQ tablet Take 2 tablets (20 mEq total) by mouth as directed. Take 2 tablets (20 mEq) twice a week with Lasix 48 tablet 3  . propranolol (INDERAL) 10 MG tablet Take 1 tablet (10 mg total) by mouth 3 (three) times daily as needed. 270 tablet 3  . triamcinolone cream (KENALOG) 0.1 % Apply 1 application topically 2 (two) times daily. 30 g 0  . vitamin B-12 (CYANOCOBALAMIN) 1000 MCG tablet Take 1,000 mcg by mouth daily.    . furosemide (LASIX) 20 MG tablet Take 1 tablet (20 mg total) by mouth as directed. Take 1 tablet (20 mg) twice a week with potassium 24 tablet 3   Current Facility-Administered Medications on File Prior to Visit  Medication Dose Route Frequency Provider Last Rate Last Dose  . albuterol (PROVENTIL) (2.5 MG/3ML) 0.083% nebulizer solution 2.5 mg  2.5 mg Nebulization Once Guse, Jacquelynn Cree, FNP        There are no Patient Instructions on file for this visit. No follow-ups on file.   Kris Hartmann, NP  This note was completed with Sales executive.  Any errors are purely unintentional.

## 2019-05-09 NOTE — Telephone Encounter (Signed)
Called patient states unable to complete call

## 2019-05-10 NOTE — Telephone Encounter (Signed)
Forms faxed to patient. Pt is aware

## 2019-05-15 ENCOUNTER — Ambulatory Visit (INDEPENDENT_AMBULATORY_CARE_PROVIDER_SITE_OTHER): Payer: Medicare Other | Admitting: Vascular Surgery

## 2019-05-15 ENCOUNTER — Other Ambulatory Visit: Payer: Self-pay

## 2019-05-15 ENCOUNTER — Encounter (INDEPENDENT_AMBULATORY_CARE_PROVIDER_SITE_OTHER): Payer: Self-pay | Admitting: Vascular Surgery

## 2019-05-15 VITALS — BP 115/71 | HR 58 | Resp 12 | Ht 65.0 in | Wt 180.0 lb

## 2019-05-15 DIAGNOSIS — I8311 Varicose veins of right lower extremity with inflammation: Secondary | ICD-10-CM

## 2019-05-15 DIAGNOSIS — I8312 Varicose veins of left lower extremity with inflammation: Secondary | ICD-10-CM | POA: Diagnosis not present

## 2019-05-15 NOTE — Progress Notes (Signed)
Varicose veins of bilateral  lower extremity with inflammation (454.1  I83.10) Current Plans   Indication: Patient presents with symptomatic varicose veins of the bilateral  lower extremity.   Procedure: Sclerotherapy using hypertonic saline mixed with 1% Lidocaine was performed on the bilateral lower extremity. Compression wraps were placed. The patient tolerated the procedure well. 

## 2019-06-04 ENCOUNTER — Other Ambulatory Visit: Payer: Self-pay

## 2019-06-05 ENCOUNTER — Encounter (INDEPENDENT_AMBULATORY_CARE_PROVIDER_SITE_OTHER): Payer: Self-pay | Admitting: Vascular Surgery

## 2019-06-05 ENCOUNTER — Ambulatory Visit (INDEPENDENT_AMBULATORY_CARE_PROVIDER_SITE_OTHER): Payer: Medicare Other | Admitting: Vascular Surgery

## 2019-06-05 VITALS — BP 153/74 | HR 71 | Resp 16 | Wt 179.0 lb

## 2019-06-05 DIAGNOSIS — I8312 Varicose veins of left lower extremity with inflammation: Secondary | ICD-10-CM | POA: Diagnosis not present

## 2019-06-05 DIAGNOSIS — I8311 Varicose veins of right lower extremity with inflammation: Secondary | ICD-10-CM

## 2019-06-05 NOTE — Progress Notes (Signed)
Varicose veins of bilateral  lower extremity with inflammation (454.1  I83.10) Current Plans   Indication: Patient presents with symptomatic varicose veins of the bilateral  lower extremity.   Procedure: Sclerotherapy using hypertonic saline mixed with 1% Lidocaine was performed on the bilateral lower extremity. Compression wraps were placed. The patient tolerated the procedure well. 

## 2019-06-06 ENCOUNTER — Encounter: Payer: Self-pay | Admitting: Internal Medicine

## 2019-06-06 ENCOUNTER — Other Ambulatory Visit: Payer: Self-pay

## 2019-06-06 ENCOUNTER — Ambulatory Visit (INDEPENDENT_AMBULATORY_CARE_PROVIDER_SITE_OTHER): Payer: Medicare Other | Admitting: Internal Medicine

## 2019-06-06 ENCOUNTER — Ambulatory Visit (INDEPENDENT_AMBULATORY_CARE_PROVIDER_SITE_OTHER): Payer: Medicare Other

## 2019-06-06 DIAGNOSIS — Z23 Encounter for immunization: Secondary | ICD-10-CM | POA: Diagnosis not present

## 2019-06-06 DIAGNOSIS — R9389 Abnormal findings on diagnostic imaging of other specified body structures: Secondary | ICD-10-CM

## 2019-06-06 DIAGNOSIS — M25552 Pain in left hip: Secondary | ICD-10-CM

## 2019-06-06 DIAGNOSIS — I10 Essential (primary) hypertension: Secondary | ICD-10-CM | POA: Diagnosis not present

## 2019-06-06 DIAGNOSIS — I4819 Other persistent atrial fibrillation: Secondary | ICD-10-CM

## 2019-06-06 NOTE — Progress Notes (Signed)
Patient ID: Kerri Mills, female   DOB: 09/01/1931, 85 y.o.   MRN: [id number]   Subjective:    Patient ID: Kerri Mills, female    DOB: October 29, 1930, 85 y.o.   MRN: [id number]  HPI  Patient here as a work in for left hip pain.  She reports persistent left hip pain.  Hurts when she puts pressure on that side.  Palpating the area.  Lying on that side.  Some increased pain when first gets up.  When she walks - improves.  Persistent pain.  Breathing stable.  Seeing Dr Mortimer Fries - for cough - bronchiectasis. Using flutter valve.  Just evaluated.  Recommended f/u CT in 3 months.  Has taken an occasional tylenol for her hip.  Does help.  Heart stable.  On eliquis.     Past Medical History:  Diagnosis Date   Allergy    Anxiety    Arthritis    Clotting disorder ([place])    Colitis    Depression    Diverticulitis 2013   Gastric ulcer    GERD (gastroesophageal reflux disease)    Hypercholesterolemia    Hypertension    Infiltrating lobular carcinoma of left breast 2011   T2,N0, ER: 90%; PR 0%; Her 2 neu not amplified. [facility]).   Melanoma ([place]) 1997   Melanoma in situ of upper extremity ([place]) 03/19/2011   Persistent atrial fibrillation    a. CHADS2VASc => 4 (HTN, age x 2, female)   Personal history of radiation therapy 2011   BREAST CA   Seroma    HISTORY OF LFT BREAST   Sleep apnea    Thyroid cancer ([place]) 1992   Past Surgical History:  Procedure Laterality Date   ABDOMINAL HYSTERECTOMY  1973   partial   BREAST BIOPSY Left 02-13-13   BENIGN BREAST TISSUE WITH CHANGES CONSISTENT WITH FAT NECROSIS   BREAST BIOPSY Left 01/21/2015   bx done in [facility] 11:00 left 6-8cmfn   BREAST EXCISIONAL BIOPSY Left 1995   neg   BREAST EXCISIONAL BIOPSY Left 2011   Breast cancer radiation   BREAST LUMPECTOMY Left 2011   BREAST CA   CARDIAC CATHETERIZATION     CHOLECYSTECTOMY     COLONOSCOPY  2013   MELANOMA EXCISION     RT UPPER ARM   PARTIAL HYSTERECTOMY       bleeding, ovaries in place.     THYROID SURGERY  1992   FOR THYROID CANCER   TONSILLECTOMY     Family History  Problem Relation Age of Onset   Heart disease Mother    Cancer Brother        lung    Cancer Sister        breast   Breast cancer Neg Hx    Social History   Socioeconomic History   Marital status: Widowed    Spouse name: Not on file   Number of children: Not on file   Years of education: Not on file   Highest education level: Not on file  Occupational History   Not on file  Social Needs   Financial resource strain: Not hard at all   Food insecurity    Worry: Never true    Inability: Never true   Transportation needs    Medical: No    Non-medical: No  Tobacco Use   Smoking status: Never Smoker   Smokeless tobacco: Never Used  Substance and Sexual Activity   Alcohol use: Yes    Alcohol/week:  0.0 standard drinks    Comment: social drinking. average times a week   Drug use: No   Sexual activity: Never  Lifestyle   Physical activity    Days per week: 7 days    Minutes per session: 40 min   Stress: Not at all  Relationships   Social connections    Talks on phone: Not on file    Gets together: Not on file    Attends religious service: Not on file    Active member of club or organization: Not on file    Attends meetings of clubs or organizations: Not on file    Relationship status: Widowed  Other Topics Concern   Not on file  Social History Narrative   Independent and baseline. Lives by herself    Outpatient Encounter Medications as of 06/06/2019  Medication Sig   albuterol (PROAIR HFA) 108 (90 Base) MCG/ACT inhaler Inhale 2 puffs into the lungs every 6 (six) hours as needed for wheezing or shortness of breath.   albuterol (VENTOLIN HFA) 108 (90 Base) MCG/ACT inhaler Inhale 2 puffs into the lungs every 4 (four) hours as needed for wheezing or shortness of breath.   ALPRAZolam (XANAX) 0.5 MG tablet TAKE 1 ST TABLET BY MOUTH  ONE HOUR PRIOR TO PROCEDURE. TAKE 2 ND TABLET UPON ARRIVAL   Cholecalciferol (VITAMIN D) 1000 UNITS capsule Take 1,000 Units by mouth daily.     DULoxetine (CYMBALTA) 60 MG capsule TAKE 1 CAPSULE (60 MG TOTAL) BY MOUTH AT BEDTIME.   ELIQUIS 5 MG TABS tablet TAKE 1 TABLET BY MOUTH TWICE A DAY   ferrous sulfate 325 (65 FE) MG tablet Take 325 mg by mouth every morning.   gabapentin (NEURONTIN) 300 MG capsule TAKE 2 CAPSULES (600 MG TOTAL) BY MOUTH AT BEDTIME.   levothyroxine (SYNTHROID) 88 MCG tablet TAKE ONE TABLET BY MOUTH ONCE DAILY BEFORE BREAKFAST   lisinopril (ZESTRIL) 20 MG tablet TAKE 1 TABLET BY MOUTH EVERY DAY   lovastatin (MEVACOR) 40 MG tablet TAKE 1 TABLET BY MOUTH EVERY DAY   Multiple Vitamins-Minerals (PRESERVISION AREDS 2) CAPS Take 1 tablet by mouth 2 (two) times daily.    omeprazole (PRILOSEC) 20 MG capsule TAKE 1 CAPSULE (20 MG TOTAL) BY MOUTH DAILY.   potassium chloride (K-DUR) 10 MEQ tablet Take 2 tablets (20 mEq total) by mouth as directed. Take 2 tablets (20 mEq) twice a week with Lasix   propranolol (INDERAL) 10 MG tablet Take 1 tablet (10 mg total) by mouth 3 (three) times daily as needed.   triamcinolone cream (KENALOG) 0.1 % Apply 1 application topically 2 (two) times daily.   vitamin B-12 (CYANOCOBALAMIN) 1000 MCG tablet Take 1,000 mcg by mouth daily.   furosemide (LASIX) 20 MG tablet Take 1 tablet (20 mg total) by mouth as directed. Take 1 tablet (20 mg) twice a week with potassium   Facility-Administered Encounter Medications as of 06/06/2019  Medication   albuterol (PROVENTIL) (2.5 MG/3ML) 0.083% nebulizer solution 2.5 mg    Review of Systems  Constitutional: Negative for appetite change and unexpected weight change.  HENT: Negative for congestion and sinus pressure.   Respiratory: Negative for chest tightness.        No increased cough or congestion.    Cardiovascular: Negative for chest pain, palpitations and leg swelling.  Gastrointestinal:  Negative for abdominal pain, diarrhea, nausea and vomiting.  Genitourinary: Negative for difficulty urinating and dysuria.  Musculoskeletal: Negative for joint swelling and myalgias.  Skin: Negative for color change  and rash.  Neurological: Negative for dizziness, light-headedness and headaches.  Psychiatric/Behavioral: Negative for agitation and dysphoric mood.       Objective:    Physical Exam Constitutional:      General: She is not in acute distress.    Appearance: Normal appearance.  HENT:     Right Ear: External ear normal.     Left Ear: External ear normal.  Eyes:     General: No scleral icterus.       Right eye: No discharge.        Left eye: No discharge.     Conjunctiva/sclera: Conjunctivae normal.  Neck:     Musculoskeletal: Neck supple. No muscular tenderness.     Thyroid: No thyromegaly.  Cardiovascular:     Rate and Rhythm: Normal rate.     Comments: Rate controlled.   Pulmonary:     Effort: No respiratory distress.     Breath sounds: Normal breath sounds. No wheezing.  Abdominal:     General: Bowel sounds are normal.     Palpations: Abdomen is soft.     Tenderness: There is no abdominal tenderness.  Musculoskeletal:        General: No swelling or tenderness.     Comments: Increased tenderness to palpation - left lateral hip.  No increased pain with SLR.  Some increased pain with abduction/adduction.    Lymphadenopathy:     Cervical: No cervical adenopathy.  Skin:    Findings: No erythema or rash.  Neurological:     Mental Status: She is alert.  Psychiatric:        Mood and Affect: Mood normal.        Behavior: Behavior normal.     BP 126/60    Pulse (!) 51    Temp 97.6 F (36.4 C) (Oral)    Ht 5\' 5"  (1.651 m)    Wt 177 lb 12.8 oz (80.6 kg)    SpO2 98%    BMI 29.59 kg/m  Wt Readings from Last 3 Encounters:  06/06/19 177 lb 12.8 oz (80.6 kg)  06/05/19 179 lb (81.2 kg)  05/15/19 180 lb (81.6 kg)     Lab Results  Component Value Date   WBC 5.2  11/21/2018   HGB 13.3 11/21/2018   HCT 40.3 11/21/2018   PLT 222.0 11/21/2018   GLUCOSE 97 11/21/2018   CHOL 168 07/19/2018   TRIG 166.0 (H) 07/19/2018   HDL 41.70 07/19/2018   LDLDIRECT 75.0 03/24/2017   LDLCALC 93 07/19/2018   ALT 16 11/21/2018   AST 18 11/21/2018   NA 140 11/21/2018   K 4.0 11/21/2018   CL 103 11/21/2018   CREATININE 0.84 11/21/2018   BUN 13 11/21/2018   CO2 31 11/21/2018   TSH 2.990 11/09/2018   INR 1.5 (H) 11/21/2018   HGBA1C 6.1 11/21/2018    Mm Diag Breast Tomo Bilateral  Result Date: 12/18/2018 CLINICAL DATA:  85 year old who underwent malignant lumpectomy of the LEFT breast in 2011. Patient had a recent CT chest demonstrating LEFT nipple retraction and macrocalcifications.Annual evaluation, RIGHT breast. EXAM: DIGITAL DIAGNOSTIC BILATERAL MAMMOGRAM WITH CAD AND TOMO COMPARISON:  Previous exam(s). ACR Breast Density Category b: There are scattered areas of fibroglandular density. FINDINGS: Tomosynthesis and synthesized full field CC and MLO views of both breasts were obtained. Extensive post lumpectomy scarring and associated benign dystrophic calcifications in the retroareolar LEFT breast, unchanged apart from further retraction of the scar over time. No new or suspicious findings in the LEFT breast.  No findings suspicious for malignancy in the RIGHT breast. Mammographic images were processed with CAD. IMPRESSION: 1. No mammographic evidence of malignancy involving either breast. 2. Extensive post lumpectomy scarring and associated benign dystrophic calcifications involving the retroareolar LEFT breast which accounts for the findings on the recent chest CT. RECOMMENDATION: Screening mammogram in one year.(Code:[id number]-01Y) I have discussed the findings and recommendations with the patient. Results were also provided in writing at the conclusion of the visit. If applicable, a reminder letter will be sent to the patient regarding the next appointment. BI-RADS CATEGORY   2: Benign. Electronically Signed   By: Evangeline Dakin M.D.   On: 12/18/2018 11:12       Assessment & Plan:   Problem List Items Addressed This Visit    Abnormal chest CT    Followed by pulmonary.  Recommended f/u chest CT in 3 months.  Just evaluated.        Atrial fibrillation ([place])    Rate controlled.  Doing well on current regimen.  Follow.        Essential hypertension    Blood pressure under good control.  Continue same medication regimen.  Follow pressures.  Follow metabolic panel.        Left hip pain    Persistent pain. Discussed possible bursitis.  Heck xray.  Further w/up pending results.        Relevant Orders   DG HIP UNILAT WITH PELVIS 2-3 VIEWS LEFT (Completed)    Other Visit Diagnoses    Need for immunization against influenza       Relevant Orders   Flu Vaccine QUAD High Dose(Fluad) (Completed)       Einar Pheasant, MD

## 2019-06-09 ENCOUNTER — Encounter: Payer: Self-pay | Admitting: Internal Medicine

## 2019-06-09 NOTE — Assessment & Plan Note (Signed)
Rate controlled.  Doing well on current regimen.  Follow.

## 2019-06-09 NOTE — Assessment & Plan Note (Signed)
Blood pressure under good control.  Continue same medication regimen.  Follow pressures.  Follow metabolic panel.   

## 2019-06-09 NOTE — Assessment & Plan Note (Signed)
Followed by pulmonary.  Recommended f/u chest CT in 3 months.  Just evaluated.

## 2019-06-09 NOTE — Assessment & Plan Note (Signed)
Persistent pain. Discussed possible bursitis.  Heck xray.  Further w/up pending results.

## 2019-06-10 ENCOUNTER — Other Ambulatory Visit: Payer: Self-pay | Admitting: Internal Medicine

## 2019-06-10 DIAGNOSIS — M25552 Pain in left hip: Secondary | ICD-10-CM

## 2019-06-10 NOTE — Progress Notes (Signed)
Order placed for physical therapy referral.

## 2019-06-12 ENCOUNTER — Other Ambulatory Visit: Payer: Self-pay | Admitting: Internal Medicine

## 2019-06-26 ENCOUNTER — Ambulatory Visit (INDEPENDENT_AMBULATORY_CARE_PROVIDER_SITE_OTHER): Payer: Medicare Other | Admitting: Vascular Surgery

## 2019-06-26 ENCOUNTER — Other Ambulatory Visit: Payer: Self-pay

## 2019-06-26 ENCOUNTER — Encounter (INDEPENDENT_AMBULATORY_CARE_PROVIDER_SITE_OTHER): Payer: Self-pay | Admitting: Vascular Surgery

## 2019-06-26 VITALS — BP 146/76 | HR 69 | Resp 16 | Wt 178.0 lb

## 2019-06-26 DIAGNOSIS — I8311 Varicose veins of right lower extremity with inflammation: Secondary | ICD-10-CM

## 2019-06-26 DIAGNOSIS — I8312 Varicose veins of left lower extremity with inflammation: Secondary | ICD-10-CM

## 2019-06-26 NOTE — Progress Notes (Signed)
Varicose veins of bilateral  lower extremity with inflammation (454.1  I83.10) Current Plans   Indication: Patient presents with symptomatic varicose veins of the bilateral  lower extremity.   Procedure: Sclerotherapy using hypertonic saline mixed with 1% Lidocaine was performed on the bilateral lower extremity. Compression wraps were placed. The patient tolerated the procedure well. 

## 2019-06-27 DIAGNOSIS — L570 Actinic keratosis: Secondary | ICD-10-CM | POA: Diagnosis not present

## 2019-06-27 DIAGNOSIS — L821 Other seborrheic keratosis: Secondary | ICD-10-CM | POA: Diagnosis not present

## 2019-06-27 DIAGNOSIS — L718 Other rosacea: Secondary | ICD-10-CM | POA: Diagnosis not present

## 2019-06-28 DIAGNOSIS — R2689 Other abnormalities of gait and mobility: Secondary | ICD-10-CM | POA: Diagnosis not present

## 2019-06-28 DIAGNOSIS — M25552 Pain in left hip: Secondary | ICD-10-CM | POA: Diagnosis not present

## 2019-07-03 ENCOUNTER — Ambulatory Visit: Payer: Medicare Other | Admitting: Internal Medicine

## 2019-07-03 DIAGNOSIS — R2689 Other abnormalities of gait and mobility: Secondary | ICD-10-CM | POA: Diagnosis not present

## 2019-07-03 DIAGNOSIS — M25552 Pain in left hip: Secondary | ICD-10-CM | POA: Diagnosis not present

## 2019-07-04 DIAGNOSIS — M25552 Pain in left hip: Secondary | ICD-10-CM | POA: Diagnosis not present

## 2019-07-04 DIAGNOSIS — R2689 Other abnormalities of gait and mobility: Secondary | ICD-10-CM | POA: Diagnosis not present

## 2019-07-05 DIAGNOSIS — M25552 Pain in left hip: Secondary | ICD-10-CM | POA: Diagnosis not present

## 2019-07-05 DIAGNOSIS — R2689 Other abnormalities of gait and mobility: Secondary | ICD-10-CM | POA: Diagnosis not present

## 2019-07-08 DIAGNOSIS — M25552 Pain in left hip: Secondary | ICD-10-CM | POA: Diagnosis not present

## 2019-07-08 DIAGNOSIS — R2689 Other abnormalities of gait and mobility: Secondary | ICD-10-CM | POA: Diagnosis not present

## 2019-07-09 DIAGNOSIS — R2689 Other abnormalities of gait and mobility: Secondary | ICD-10-CM | POA: Diagnosis not present

## 2019-07-09 DIAGNOSIS — M25552 Pain in left hip: Secondary | ICD-10-CM | POA: Diagnosis not present

## 2019-07-12 ENCOUNTER — Other Ambulatory Visit: Payer: Self-pay

## 2019-07-12 ENCOUNTER — Encounter: Payer: Self-pay | Admitting: Internal Medicine

## 2019-07-12 ENCOUNTER — Ambulatory Visit (INDEPENDENT_AMBULATORY_CARE_PROVIDER_SITE_OTHER): Payer: Medicare Other | Admitting: Internal Medicine

## 2019-07-12 VITALS — BP 118/70 | HR 84 | Temp 97.3°F | Ht 65.0 in | Wt 179.4 lb

## 2019-07-12 DIAGNOSIS — J411 Mucopurulent chronic bronchitis: Secondary | ICD-10-CM | POA: Diagnosis not present

## 2019-07-12 DIAGNOSIS — G4719 Other hypersomnia: Secondary | ICD-10-CM | POA: Diagnosis not present

## 2019-07-12 DIAGNOSIS — R2689 Other abnormalities of gait and mobility: Secondary | ICD-10-CM | POA: Diagnosis not present

## 2019-07-12 DIAGNOSIS — M25552 Pain in left hip: Secondary | ICD-10-CM | POA: Diagnosis not present

## 2019-07-12 DIAGNOSIS — J479 Bronchiectasis, uncomplicated: Secondary | ICD-10-CM

## 2019-07-12 NOTE — Patient Instructions (Addendum)
Continue Flutter Valve 10-15 times per day  Albuterol as needed  H/o OSA and excessive daytime sleepiness Patient needs Home Sleep Study

## 2019-07-12 NOTE — Progress Notes (Signed)
Name: Kerri Mills MRN: [id number] DOB: [birth year]     CONSULTATION DATE: 07/12/2019 REFERRING MD : Nicki Reaper   CHIEF COMPLAINT:  Follow-up productive cough   HISTORY OF PRESENT ILLNESS: Patient has had chronic productive cough for the last 9 months She was prescribed flutter valve but she is intermittently compliant with this  She stated that flutter valve initially did help her breathing Patient is to use her flutter valve more frequently  Patient does have a previous history of infections and pneumonias in the past Albuterol inhaler does help  She is a non-smoker Does have a history of secondhand smoke exposure  Family history of emphysema CT of the chest shows bilateral subcentimeter nodular opacities in March 2020 with bilateral bronchiectasis Images reviewed by me today  This is suggestive of mucoid impaction  No  exacerbation at this time No evidence of heart failure at this time No evidence or signs of infection at this time No respiratory distress No fevers, chills, nausea, vomiting, diarrhea No evidence of lower extremity edema No evidence hemoptysis  Patient has a previous history of sleep apnea Does have excessive daytime sleepiness and fatigue    Patient is seen today for problems and issues with sleep related to excessive daytime sleepiness Patient  has been having sleep problems for many years Patient has been having excessive daytime sleepiness for a long time Patient has been having extreme fatigue and tiredness, lack of energy +  very Loud snoring every night   Discussed sleep data and reviewed with patient.  Encouraged proper weight management.  Discussed driving precautions and its relationship with hypersomnolence.  Discussed operating dangerous equipment and its relationship with hypersomnolence.  Discussed sleep hygiene, and benefits of a fixed sleep waked time.  The importance of getting eight or more hours of sleep discussed with  patient.  Discussed limiting the use of the computer and television before bedtime.  Decrease naps during the day, so night time sleep will become enhanced.  Limit caffeine, and sleep deprivation.  HTN, stroke, and heart failure are potential risk factors.    EPWORTH SLEEP SCORE 0   PAST MEDICAL HISTORY :   has a past medical history of Allergy, Anxiety, Arthritis, Clotting disorder ([place]), Colitis, Depression, Diverticulitis (2013), Gastric ulcer, GERD (gastroesophageal reflux disease), Hypercholesterolemia, Hypertension, Infiltrating lobular carcinoma of left breast (2011), Melanoma ([place]) (1997), Melanoma in situ of upper extremity ([place]) (03/19/2011), Persistent atrial fibrillation, Personal history of radiation therapy (2011), Seroma, Sleep apnea, and Thyroid cancer ([place]) (1992).  has a past surgical history that includes Tonsillectomy; Cholecystectomy; Melanoma excision; Thyroid surgery (1992); Partial hysterectomy; Colonoscopy (2013); Abdominal hysterectomy (1973); Breast lumpectomy (Left, 2011); Cardiac catheterization; Breast biopsy (Left, 02-13-13); Breast excisional biopsy (Left, 1995); Breast excisional biopsy (Left, 2011); and Breast biopsy (Left, 01/21/2015). Prior to Admission medications   Medication Sig Start Date End Date Taking? Authorizing Provider  albuterol (PROAIR HFA) 108 (90 Base) MCG/ACT inhaler Inhale 2 puffs into the lungs every 6 (six) hours as needed for wheezing or shortness of breath. 08/10/18   Einar Pheasant, MD  Cholecalciferol (VITAMIN D) 1000 UNITS capsule Take 1,000 Units by mouth daily.      [provider]  DULoxetine (CYMBALTA) 60 MG capsule TAKE 1 CAPSULE (60 MG TOTAL) BY MOUTH AT BEDTIME. 03/12/19   Einar Pheasant, MD  ELIQUIS 5 MG TABS tablet TAKE 1 TABLET BY MOUTH TWICE A DAY 03/11/19   Minna Merritts, MD  ferrous sulfate 325 (65 FE) MG tablet Take 325 mg  by mouth every morning.    [provider]  furosemide (LASIX) 20 MG tablet Take 1  tablet (20 mg total) by mouth as directed. Take 1 tablet (20 mg) twice a week with potassium 10/03/18 01/01/19  Minna Merritts, MD  gabapentin (NEURONTIN) 300 MG capsule TAKE 2 CAPSULES (600 MG TOTAL) BY MOUTH AT BEDTIME. 03/06/19   Einar Pheasant, MD  levothyroxine (SYNTHROID) 88 MCG tablet TAKE ONE TABLET BY MOUTH ONCE DAILY BEFORE BREAKFAST 03/12/19   Einar Pheasant, MD  lisinopril (ZESTRIL) 20 MG tablet TAKE 1 TABLET BY MOUTH EVERY DAY 02/01/19   Einar Pheasant, MD  lovastatin (MEVACOR) 40 MG tablet TAKE 1 TABLET BY MOUTH EVERY DAY 11/23/18   Einar Pheasant, MD  Multiple Vitamins-Minerals (PRESERVISION AREDS 2) CAPS Take 1 tablet by mouth 2 (two) times daily.     [provider]  omeprazole (PRILOSEC) 20 MG capsule TAKE 1 CAPSULE (20 MG TOTAL) BY MOUTH DAILY. 11/23/18   Einar Pheasant, MD  potassium chloride (K-DUR) 10 MEQ tablet Take 2 tablets (20 mEq total) by mouth as directed. Take 2 tablets (20 mEq) twice a week with Lasix 10/03/18   Gollan, Kathlene November, MD  propranolol (INDERAL) 10 MG tablet Take 1 tablet (10 mg total) by mouth 3 (three) times daily as needed. 03/05/19   Minna Merritts, MD  triamcinolone cream (KENALOG) 0.1 % Apply 1 application topically 2 (two) times daily. 08/10/18   Einar Pheasant, MD  vitamin B-12 (CYANOCOBALAMIN) 1000 MCG tablet Take 1,000 mcg by mouth daily.    [provider]   Allergies  Allergen Reactions  . Atorvastatin     Other reaction(s): Other (See Comments) STIFFNESS AND SORE STIFFNESS AND SORE  . Lipitor [Atorvastatin Calcium] Other (See Comments)    Stiffness & soreness  . Penicillins Rash    REACTION: Unknown reaction    FAMILY HISTORY:  family history includes Cancer in her brother and sister; Heart disease in her mother. SOCIAL HISTORY:  reports that she has never smoked. She has never used smokeless tobacco. She reports current alcohol use. She reports that she does not use drugs.      Review of Systems:  Gen:  Denies   fever, sweats, chills weight loss  HEENT: Denies blurred vision, double vision, ear pain, eye pain, hearing loss, nose bleeds, sore throat Cardiac:  No dizziness, chest pain or heaviness, chest tightness,edema, No JVD Resp:   No cough, -sputum production, -shortness of breath,-wheezing, -hemoptysis,  Gi: Denies swallowing difficulty, stomach pain, nausea or vomiting, diarrhea, constipation, bowel incontinence Gu:  Denies bladder incontinence, burning urine Ext:   Denies Joint pain, stiffness or swelling Skin: Denies  skin rash, easy bruising or bleeding or hives Endoc:  Denies polyuria, polydipsia , polyphagia or weight change Psych:   Denies depression, insomnia or hallucinations  Other:  All other systems negative  BP 118/70   Pulse 84   Temp (!) 97.3 F (36.3 C) (Temporal)   Ht 5\' 5"  (1.651 m)   Wt 179 lb 6.4 oz (81.4 kg)   SpO2 98%   BMI 29.85 kg/m     Physical Examination:   GENERAL:NAD, no fevers, chills, no weakness no fatigue HEAD: Normocephalic, atraumatic.  EYES: PERLA, EOMI No scleral icterus.  NECK: Supple. No thyromegaly.  No JVD.  PULMONARY: CTA B/L no wheezing, rhonchi, crackles CARDIOVASCULAR: S1 and S2. Regular rate and rhythm. No murmurs GASTROINTESTINAL: Soft, nontender, nondistended. Positive bowel sounds.  MUSCULOSKELETAL: No swelling, clubbing, or edema.  NEUROLOGIC: No  gross focal neurological deficits. 5/5 strength all extremities SKIN: No ulceration, lesions, rashes, or cyanosis.  PSYCHIATRIC: Insight, judgment intact. -depression -anxiety ALL OTHER ROS ARE NEGATIVE         IMAGING        The CXR was Independently Reviewed By Me Today CXR reviewed-B/L bronchiectasis     ASSESSMENT AND PLAN SYNOPSIS  Chronic productive cough consistent with chronic bronchitis in the setting of bronchiectasis on CT chest with a previous history of pneumonia No signs of infection at this time No indication for antibiotics or prednisone at this time   I recommend flutter valve 10-15 times per day I also recommend daily exercise as tolerated Albuterol as needed  Patient has a previous diagnosis of sleep apnea approximately 10 years ago Patient states she has problems breathing at nighttime and has snoring Patient has excessive daytime sleepiness with fatigue Home sleep study ordered-to be picked up in [place]: The signs and symptoms of COVID-19 were discussed with the patient and how to seek care for testing.  The importance of social distancing was discussed today. Hand Washing Techniques and avoid touching face was advised.     MEDICATION ADJUSTMENTS/LABS AND TESTS ORDERED: Continue Flutter Valve 10-15 times per day Albuterol as needed Home sleep study ordered  CURRENT MEDICATIONS REVIEWED AT LENGTH WITH PATIENT TODAY   Patient satisfied with Plan of action and management. All questions answered  Follow up in 6 months   Tequita Marrs Patricia Pesa, M.D.  [facility]  Medical Director [place] Director [facility] Cardio-Pulmonary Department

## 2019-07-16 DIAGNOSIS — R2689 Other abnormalities of gait and mobility: Secondary | ICD-10-CM | POA: Diagnosis not present

## 2019-07-16 DIAGNOSIS — M25552 Pain in left hip: Secondary | ICD-10-CM | POA: Diagnosis not present

## 2019-07-17 ENCOUNTER — Other Ambulatory Visit: Payer: Self-pay

## 2019-07-17 ENCOUNTER — Ambulatory Visit (INDEPENDENT_AMBULATORY_CARE_PROVIDER_SITE_OTHER): Payer: Medicare Other | Admitting: Vascular Surgery

## 2019-07-17 ENCOUNTER — Encounter (INDEPENDENT_AMBULATORY_CARE_PROVIDER_SITE_OTHER): Payer: Self-pay | Admitting: Vascular Surgery

## 2019-07-17 VITALS — BP 123/64 | HR 70 | Resp 16 | Wt 179.0 lb

## 2019-07-17 DIAGNOSIS — I8312 Varicose veins of left lower extremity with inflammation: Secondary | ICD-10-CM | POA: Diagnosis not present

## 2019-07-17 DIAGNOSIS — I8311 Varicose veins of right lower extremity with inflammation: Secondary | ICD-10-CM

## 2019-07-17 DIAGNOSIS — R2689 Other abnormalities of gait and mobility: Secondary | ICD-10-CM | POA: Diagnosis not present

## 2019-07-17 DIAGNOSIS — M25552 Pain in left hip: Secondary | ICD-10-CM | POA: Diagnosis not present

## 2019-07-17 NOTE — Progress Notes (Signed)
Varicose veins of bilateral  lower extremity with inflammation (454.1  I83.10) Current Plans   Indication: Patient presents with symptomatic varicose veins of the bilateral  lower extremity.   Procedure: Sclerotherapy using hypertonic saline mixed with 1% Lidocaine was performed on the bilateral lower extremity. Compression wraps were placed. The patient tolerated the procedure well. 

## 2019-07-22 DIAGNOSIS — R2689 Other abnormalities of gait and mobility: Secondary | ICD-10-CM | POA: Diagnosis not present

## 2019-07-22 DIAGNOSIS — M25552 Pain in left hip: Secondary | ICD-10-CM | POA: Diagnosis not present

## 2019-07-23 DIAGNOSIS — M25552 Pain in left hip: Secondary | ICD-10-CM | POA: Diagnosis not present

## 2019-07-23 DIAGNOSIS — R2689 Other abnormalities of gait and mobility: Secondary | ICD-10-CM | POA: Diagnosis not present

## 2019-07-25 ENCOUNTER — Other Ambulatory Visit: Payer: Self-pay

## 2019-07-25 ENCOUNTER — Ambulatory Visit (INDEPENDENT_AMBULATORY_CARE_PROVIDER_SITE_OTHER): Payer: Medicare Other | Admitting: Internal Medicine

## 2019-07-25 ENCOUNTER — Ambulatory Visit (INDEPENDENT_AMBULATORY_CARE_PROVIDER_SITE_OTHER): Payer: Medicare Other

## 2019-07-25 ENCOUNTER — Telehealth: Payer: Self-pay | Admitting: Internal Medicine

## 2019-07-25 DIAGNOSIS — I10 Essential (primary) hypertension: Secondary | ICD-10-CM

## 2019-07-25 DIAGNOSIS — K219 Gastro-esophageal reflux disease without esophagitis: Secondary | ICD-10-CM

## 2019-07-25 DIAGNOSIS — R05 Cough: Secondary | ICD-10-CM | POA: Diagnosis not present

## 2019-07-25 DIAGNOSIS — R9389 Abnormal findings on diagnostic imaging of other specified body structures: Secondary | ICD-10-CM | POA: Diagnosis not present

## 2019-07-25 DIAGNOSIS — R2689 Other abnormalities of gait and mobility: Secondary | ICD-10-CM | POA: Diagnosis not present

## 2019-07-25 DIAGNOSIS — E7800 Pure hypercholesterolemia, unspecified: Secondary | ICD-10-CM

## 2019-07-25 DIAGNOSIS — Z0000 Encounter for general adult medical examination without abnormal findings: Secondary | ICD-10-CM

## 2019-07-25 DIAGNOSIS — R739 Hyperglycemia, unspecified: Secondary | ICD-10-CM

## 2019-07-25 DIAGNOSIS — G4733 Obstructive sleep apnea (adult) (pediatric): Secondary | ICD-10-CM

## 2019-07-25 DIAGNOSIS — I4891 Unspecified atrial fibrillation: Secondary | ICD-10-CM

## 2019-07-25 DIAGNOSIS — Z85850 Personal history of malignant neoplasm of thyroid: Secondary | ICD-10-CM

## 2019-07-25 DIAGNOSIS — M25552 Pain in left hip: Secondary | ICD-10-CM | POA: Diagnosis not present

## 2019-07-25 DIAGNOSIS — D649 Anemia, unspecified: Secondary | ICD-10-CM | POA: Diagnosis not present

## 2019-07-25 DIAGNOSIS — R059 Cough, unspecified: Secondary | ICD-10-CM

## 2019-07-25 DIAGNOSIS — Z853 Personal history of malignant neoplasm of breast: Secondary | ICD-10-CM

## 2019-07-25 NOTE — Telephone Encounter (Signed)
Patient is aware and will continue to monitor.

## 2019-07-25 NOTE — Telephone Encounter (Signed)
She was feeling fine.  Was elevated initially.  Keep me posted.  Let us know if she needs anything.

## 2019-07-25 NOTE — Progress Notes (Addendum)
Patient ID: Kerri Mills, female   DOB: Feb 08, 1931, 85 y.o.   MRN: [id number]   Virtual Visit via telephone Note  This visit type was conducted due to national recommendations for restrictions regarding the COVID-19 pandemic (e.g. social distancing).  This format is felt to be most appropriate for this patient at this time.  All issues noted in this document were discussed and addressed.  No physical exam was performed (except for noted visual exam findings with Video Visits).   I connected with Kerri Mills by telephone and verified that I am speaking with the correct person using two identifiers. Location patient: home Location provider: work Persons participating in the telephonevisit: patient, provider  I discussed the limitations, risks, security and privacy concerns of performing an evaluation and management service by telephone and the availability of in person appointments. The patient expressed understanding and agreed to proceed.   Reason for visit: scheduled follow up  HPI: She has been seeing pulmonary for chronic productive cough consistent with chronic bronchitis/bronchiectaiss.  Last evaluated 07/12/19.  Recommended flutter valve. Also being scheduled for sleep study.   She is using flutter valve.  Rarely uses albuterol.  States feels breathing is stable.  No chest pain.  No acid reflux.  No abdominal pain.  Bowels moving.  Blood pressure doing well - averaging 118-120s/70-72.  Started having some trouble with tremors after stopping her propranolol.  Discussed with cardiology. She restarted propranolol - q day.  Controlling tremors.  Doing physical therapy - starting therapy on her shoulder tomorrow.     ROS: See pertinent positives and negatives per HPI.  Past Medical History:  Diagnosis Date  . Allergy   . Anxiety   . Arthritis   . Clotting disorder ([place])   . Colitis   . Depression   . Diverticulitis 2013  . Gastric ulcer   . GERD (gastroesophageal reflux disease)   .  Hypercholesterolemia   . Hypertension   . Infiltrating lobular carcinoma of left breast 2011   T2,N0, ER: 90%; PR 0%; Her 2 neu not amplified. [facility]).  . Melanoma ([place]) 1997  . Melanoma in situ of upper extremity ([place]) 03/19/2011  . Persistent atrial fibrillation ([place])    a. CHADS2VASc => 4 (HTN, age x 2, female)  . Personal history of radiation therapy 2011   BREAST CA  . Seroma    HISTORY OF LFT BREAST  . Sleep apnea   . Thyroid cancer ([place]) 1992    Past Surgical History:  Procedure Laterality Date  . ABDOMINAL HYSTERECTOMY  1973   partial  . BREAST BIOPSY Left 02-13-13   BENIGN BREAST TISSUE WITH CHANGES CONSISTENT WITH FAT NECROSIS  . BREAST BIOPSY Left 01/21/2015   bx done in [place] office 11:00 left 6-8cmfn  . BREAST EXCISIONAL BIOPSY Left 1995   neg  . BREAST EXCISIONAL BIOPSY Left 2011   Breast cancer radiation  . BREAST LUMPECTOMY Left 2011   BREAST CA  . CARDIAC CATHETERIZATION    . CHOLECYSTECTOMY    . COLONOSCOPY  2013  . MELANOMA EXCISION     RT UPPER ARM  . PARTIAL HYSTERECTOMY     bleeding, ovaries in place.    . THYROID SURGERY  1992   FOR THYROID CANCER  . TONSILLECTOMY      Family History  Problem Relation Age of Onset  . Heart disease Mother   . Cancer Brother        lung   . Cancer Sister  breast  . Breast cancer Neg Hx     SOCIAL HX: reviewed.    Current Outpatient Medications:  .  albuterol (PROAIR HFA) 108 (90 Base) MCG/ACT inhaler, Inhale 2 puffs into the lungs every 6 (six) hours as needed for wheezing or shortness of breath., Disp: 1 Inhaler, Rfl: 3 .  Cholecalciferol (VITAMIN D) 1000 UNITS capsule, Take 1,000 Units by mouth daily.  , Disp: , Rfl:  .  DULoxetine (CYMBALTA) 60 MG capsule, TAKE 1 CAPSULE (60 MG TOTAL) BY MOUTH AT BEDTIME., Disp: 90 capsule, Rfl: 1 .  ELIQUIS 5 MG TABS tablet, TAKE 1 TABLET BY MOUTH TWICE A DAY, Disp: 180 tablet, Rfl: 1 .  gabapentin (NEURONTIN) 300 MG capsule, TAKE 2 CAPSULES (600  MG TOTAL) BY MOUTH AT BEDTIME., Disp: 180 capsule, Rfl: 1 .  levothyroxine (SYNTHROID) 88 MCG tablet, TAKE ONE TABLET BY MOUTH ONCE DAILY BEFORE BREAKFAST, Disp: 90 tablet, Rfl: 1 .  lisinopril (ZESTRIL) 20 MG tablet, TAKE 1 TABLET BY MOUTH EVERY DAY, Disp: 30 tablet, Rfl: 5 .  lovastatin (MEVACOR) 40 MG tablet, TAKE 1 TABLET BY MOUTH EVERY DAY, Disp: 90 tablet, Rfl: 3 .  Multiple Vitamins-Minerals (PRESERVISION AREDS 2) CAPS, Take 1 tablet by mouth 2 (two) times daily. , Disp: , Rfl:  .  omeprazole (PRILOSEC) 20 MG capsule, TAKE 1 CAPSULE (20 MG TOTAL) BY MOUTH DAILY., Disp: 90 capsule, Rfl: 3 .  potassium chloride (K-DUR) 10 MEQ tablet, Take 2 tablets (20 mEq total) by mouth as directed. Take 2 tablets (20 mEq) twice a week with Lasix, Disp: 48 tablet, Rfl: 3 .  propranolol (INDERAL) 10 MG tablet, Take 1 tablet (10 mg total) by mouth 3 (three) times daily as needed., Disp: 270 tablet, Rfl: 3 .  triamcinolone cream (KENALOG) 0.1 %, Apply 1 application topically 2 (two) times daily. (Patient taking differently: Apply 1 application topically as needed. ), Disp: 30 g, Rfl: 0 .  furosemide (LASIX) 20 MG tablet, Take 1 tablet (20 mg total) by mouth as directed. Take 1 tablet (20 mg) twice a week with potassium, Disp: 24 tablet, Rfl: 3  Current Facility-Administered Medications:  .  albuterol (PROVENTIL) (2.5 MG/3ML) 0.083% nebulizer solution 2.5 mg, 2.5 mg, Nebulization, Once, Guse, Jacquelynn Cree, FNP  EXAM:  VITALS per patient if applicable: 778/24 (averages - 118-120s/70-72).   GENERAL: alert.  Sounds to be in no acute distress.  Answering questions appropriately.    PSYCH/NEURO: pleasant and cooperative, no obvious depression or anxiety, speech and thought processing grossly intact  ASSESSMENT AND PLAN:  Discussed the following assessment and plan:  Abnormal chest CT Being followed by pulmonary.    Anemia Follow cbc.   Atrial fibrillation Rate controlled.  Doing well on current regimen.   Follow.    Cough Followed by pulmonary.  Using flutter valve.  Rarely needs albuterol.  Stable.   Essential hypertension Blood pressure has been doing well.  Follow pressures.  Follow metabolic panel.   GERD Controlled.    History of breast cancer Mammogram 11/28/18 - [facility]  History of thyroid cancer S/p XRT.  On thyroid replacement.  Follow tsh.   Hypercholesterolemia On lovastatin.  Follow lipid panel and liver function tests.    Hyperglycemia Low carb diet and exercise.  Follow met b and a1c.   Obstructive sleep apnea Pulmonary planning to schedule sleep study.      I discussed the assessment and treatment plan with the patient. The patient was provided an opportunity to ask questions and  all were answered. The patient agreed with the plan and demonstrated an understanding of the instructions.   The patient was advised to call back or seek an in-person evaluation if the symptoms worsen or if the condition fails to improve as anticipated.  I provided 22 minutes of non-face-to-face time during this encounter.   Einar Pheasant, MD

## 2019-07-25 NOTE — Telephone Encounter (Signed)
Pt did a doxy with Dr. Nicki Reaper today. Her BP readings are; at 2:10pm it was 119/51-57, then at 2:47; 101/56-50. She is going to take the dog for a walk and hope it comes up.

## 2019-07-25 NOTE — Patient Instructions (Addendum)
  Kerri Mills , Thank you for taking time to come for your Medicare Wellness Visit. I appreciate your ongoing commitment to your health goals. Please review the following plan we discussed and let me know if I can assist you in the future.   These are the goals we discussed: Goals    . Follow up with Primary Care Provider     As needed       This is a list of the screening recommended for you and due dates:  Health Maintenance  Topic Date Due  . Mammogram  12/18/2019  . Tetanus Vaccine  11/15/2026  . Flu Shot  Completed  . DEXA scan (bone density measurement)  Completed  . Pneumonia vaccines  Completed

## 2019-07-25 NOTE — Progress Notes (Addendum)
Subjective:   Kerri Mills is a 85 y.o. female who presents for Medicare Annual (Subsequent) preventive examination.  Review of Systems:  No ROS.  Medicare Wellness Virtual Visit.  Visual/audio telehealth visit, UTA vital signs.   See social history for additional risk factors.   Cardiac Risk Factors include: advanced age (>35men, >77 women);hypertension     Objective:     Vitals: There were no vitals taken for this visit.  There is no height or weight on file to calculate BMI.  Advanced Directives 07/25/2019 07/19/2018 09/29/2017 09/29/2017 09/29/2017 09/29/2017 07/03/2017  Does Patient Have a Medical Advance Directive? Yes Yes - Yes No No Yes  Type of Paramedic of [place];Living will [place];Living will - [place];Living will - - [place];Living will  Does patient want to make changes to medical advance directive? No - Patient declined No - Patient declined No - Patient declined - - - No - Patient declined  Copy of [place] in Chart? Yes - validated most recent copy scanned in chart (See row information) Yes - No - copy requested - - No - copy requested  Would patient like information on creating a medical advance directive? - - - - No - Patient declined Yes (ED - Information included in AVS) -    Tobacco Social History   Tobacco Use  Smoking Status Never Smoker  Smokeless Tobacco Never Used     Counseling given: Not Answered   Clinical Intake:  Pre-visit preparation completed: Yes        Diabetes: No  How often do you need to have someone help you when you read instructions, pamphlets, or other written materials from your doctor or pharmacy?: 1 - Never  Interpreter Needed?: No     Past Medical History:  Diagnosis Date  . Allergy   . Anxiety   . Arthritis   . Clotting disorder ([place])   . Colitis   . Depression   . Diverticulitis 2013  . Gastric ulcer   .  GERD (gastroesophageal reflux disease)   . Hypercholesterolemia   . Hypertension   . Infiltrating lobular carcinoma of left breast 2011   T2,N0, ER: 90%; PR 0%; Her 2 neu not amplified. [facility]).  . Melanoma ([place]) 1997  . Melanoma in situ of upper extremity ([place]) 03/19/2011  . Persistent atrial fibrillation ([place])    a. CHADS2VASc => 4 (HTN, age x 2, female)  . Personal history of radiation therapy 2011   BREAST CA  . Seroma    HISTORY OF LFT BREAST  . Sleep apnea   . Thyroid cancer ([place]) 1992   Past Surgical History:  Procedure Laterality Date  . ABDOMINAL HYSTERECTOMY  1973   partial  . BREAST BIOPSY Left 02-13-13   BENIGN BREAST TISSUE WITH CHANGES CONSISTENT WITH FAT NECROSIS  . BREAST BIOPSY Left 01/21/2015   bx done in [place] office 11:00 left 6-8cmfn  . BREAST EXCISIONAL BIOPSY Left 1995   neg  . BREAST EXCISIONAL BIOPSY Left 2011   Breast cancer radiation  . BREAST LUMPECTOMY Left 2011   BREAST CA  . CARDIAC CATHETERIZATION    . CHOLECYSTECTOMY    . COLONOSCOPY  2013  . MELANOMA EXCISION     RT UPPER ARM  . PARTIAL HYSTERECTOMY     bleeding, ovaries in place.    . THYROID SURGERY  1992   FOR THYROID CANCER  . TONSILLECTOMY  Family History  Problem Relation Age of Onset  . Heart disease Mother   . Cancer Brother        lung   . Cancer Sister        breast  . Breast cancer Neg Hx    Social History   Socioeconomic History  . Marital status: Widowed    Spouse name: Not on file  . Number of children: Not on file  . Years of education: Not on file  . Highest education level: Not on file  Occupational History  . Not on file  Social Needs  . Financial resource strain: Not hard at all  . Food insecurity    Worry: Never true    Inability: Never true  . Transportation needs    Medical: No    Non-medical: No  Tobacco Use  . Smoking status: Never Smoker  . Smokeless tobacco: Never Used  Substance and Sexual Activity  . Alcohol use: Yes     Alcohol/week: 0.0 standard drinks    Comment: social drinking. average times a week  . Drug use: No  . Sexual activity: Never  Lifestyle  . Physical activity    Days per week: 7 days    Minutes per session: 40 min  . Stress: Not at all  Relationships  . Social Herbalist on phone: Not on file    Gets together: Not on file    Attends religious service: Not on file    Active member of club or organization: Not on file    Attends meetings of clubs or organizations: Not on file    Relationship status: Widowed  Other Topics Concern  . Not on file  Social History Narrative   Independent and baseline. Lives by herself    Outpatient Encounter Medications as of 07/25/2019  Medication Sig  . albuterol (PROAIR HFA) 108 (90 Base) MCG/ACT inhaler Inhale 2 puffs into the lungs every 6 (six) hours as needed for wheezing or shortness of breath.  . Cholecalciferol (VITAMIN D) 1000 UNITS capsule Take 1,000 Units by mouth daily.    . DULoxetine (CYMBALTA) 60 MG capsule TAKE 1 CAPSULE (60 MG TOTAL) BY MOUTH AT BEDTIME.  Marland Kitchen ELIQUIS 5 MG TABS tablet TAKE 1 TABLET BY MOUTH TWICE A DAY  . furosemide (LASIX) 20 MG tablet Take 1 tablet (20 mg total) by mouth as directed. Take 1 tablet (20 mg) twice a week with potassium  . gabapentin (NEURONTIN) 300 MG capsule TAKE 2 CAPSULES (600 MG TOTAL) BY MOUTH AT BEDTIME.  Marland Kitchen levothyroxine (SYNTHROID) 88 MCG tablet TAKE ONE TABLET BY MOUTH ONCE DAILY BEFORE BREAKFAST  . lisinopril (ZESTRIL) 20 MG tablet TAKE 1 TABLET BY MOUTH EVERY DAY  . lovastatin (MEVACOR) 40 MG tablet TAKE 1 TABLET BY MOUTH EVERY DAY  . Multiple Vitamins-Minerals (PRESERVISION AREDS 2) CAPS Take 1 tablet by mouth 2 (two) times daily.   Marland Kitchen omeprazole (PRILOSEC) 20 MG capsule TAKE 1 CAPSULE (20 MG TOTAL) BY MOUTH DAILY.  Marland Kitchen potassium chloride (K-DUR) 10 MEQ tablet Take 2 tablets (20 mEq total) by mouth as directed. Take 2 tablets (20 mEq) twice a week with Lasix  . propranolol (INDERAL)  10 MG tablet Take 1 tablet (10 mg total) by mouth 3 (three) times daily as needed.  . triamcinolone cream (KENALOG) 0.1 % Apply 1 application topically 2 (two) times daily. (Patient taking differently: Apply 1 application topically as needed. )   Facility-Administered Encounter Medications as of 07/25/2019  Medication  .  albuterol (PROVENTIL) (2.5 MG/3ML) 0.083% nebulizer solution 2.5 mg    Activities of Daily Living In your present state of health, do you have any difficulty performing the following activities: 07/25/2019  Hearing? N  Vision? N  Difficulty concentrating or making decisions? N  Walking or climbing stairs? N  Comment She paces herself. Cane in use as needed.  Dressing or bathing? N  Doing errands, shopping? N  Preparing Food and eating ? N  Comment She uses an Horticulturist, commercial in her apartment. Staff prepares meals in dining hall.  Using the Toilet? N  In the past six months, have you accidently leaked urine? Y  Do you have problems with loss of bowel control? N  Managing your Medications? N  Managing your Finances? Y  Comment Daughter manages finances  Housekeeping or managing your Housekeeping? N  Some recent data might be hidden    Patient Care Team: Einar Pheasant, MD as PCP - General (Internal Medicine) Bary Castilla, Forest Gleason, MD (General Surgery) Rocco Serene, MD (Internal Medicine)    Assessment:   This is a routine wellness examination for [place].  Nurse connected with patient 07/25/19 at 12:00 PM EDT by a telephone enabled telemedicine application and verified that I am speaking with the correct person using two identifiers. Patient stated full name and DOB. Patient gave permission to continue with virtual visit. Patient's location was at home and Nurse's location was at [place] office.   Health Maintenance Due: See completed HM at the end of note.   Eye: Visual acuity not assessed. Virtual visit. Wears corrective lenses. Followed by  their ophthalmologist. Lenard Forth macular.   Dental: UTD  Hearing: Demonstrates normal hearing during visit.  Safety:  Patient feels safe at home- yes Patient does have smoke detectors at home- yes Patient does wear sunscreen or protective clothing when in direct sunlight - yes Patient does wear seat belt when in a moving vehicle - yes Patient drives- yes Adequate lighting in walkways free from debris- yes Grab bars and handrails used as appropriate- yes Ambulates with cane as an assistive device as needed. Cell phone or lifeline/life alert/medic alert on person when ambulating-yes  Social: Alcohol intake - yes      Smoking history- never   Smokers in home? none Illicit drug use? none  Depression: PHQ 2 &9 complete. See screening below. Denies irritability, anhedonia, sadness/tearfullness.    Falls: See screening below.  No falls since last reported to pcp.   Medication: Taking as directed and without issues.   Covid-19: Precautions and sickness symptoms discussed. Wears mask, social distancing, hand hygiene as appropriate.   Activities of Daily Living Patient denies needing assistance with: household chores, feeding themselves, getting from bed to chair, getting to the toilet, bathing/showering, dressing, or preparing meals.  Assisted by daughter with finances.    Memory: Patient is alert. Patient denies difficulty focusing or concentrating. Correctly identified the president of the [place], season and recall. Patient likes to do hallway bingo, plays cards, paints and crossword puzzles for brain stimulation.   BMI- discussed the importance of a healthy diet, water intake and the benefits of aerobic exercise.  Educational material provided.  Physical activity- physical therapy x3 weekly, 30 minutes. Stretching. Walks her dog casually, daily, 4 times daily.   Diet:  Regular Water: good intake  Other Providers Patient Care Team: Einar Pheasant, MD as PCP - General  (Internal Medicine) Bary Castilla, Forest Gleason, MD (General Surgery) Rocco Serene, MD (Internal Medicine)  Exercise Activities and Dietary recommendations Current Exercise Habits: Home exercise routine, Type of exercise: stretching;walking(Physical therapy 3 times weekly), Intensity: Mild  Goals    . Follow up with Primary Care Provider     As needed       Fall Risk Fall Risk  07/25/2019 06/06/2019 09/21/2018 07/19/2018 07/03/2017  Falls in the past year? 1 1 1  Yes No  Number falls in past yr: (No Data) 1 0 2 or more -  Comment None new since last reported 06/13/19 - - - -  Injury with Fall? - 1 1 Yes -  Comment - - - Fall 2 months ago. She sought medical care at the ED.  Followed by pcp. -  Risk for fall due to : - History of fall(s) - History of fall(s) -  Risk for fall due to: Comment - - - - -  Follow up - - - Education provided -   Timed Get Up and Go performed: no, virtual visit  Depression Screen PHQ 2/9 Scores 07/25/2019 06/06/2019 03/25/2019 07/19/2018  PHQ - 2 Score 0 0 0 0  PHQ- 9 Score - - - -     Cognitive Function MMSE - Mini Mental State Exam 07/03/2017 07/01/2016 07/01/2015  Orientation to time 5 5 5   Orientation to Place 5 5 5   Registration 3 3 3   Attention/ Calculation 5 5 5   Recall 3 3 3   Language- name 2 objects 2 2 2   Language- repeat 1 1 1   Language- follow 3 step command 3 3 3   Language- read & follow direction 1 1 1   Write a sentence 1 1 1   Copy design 1 1 1   Total score 30 30 30      6CIT Screen 07/25/2019 07/19/2018  What Year? 0 points 0 points  What month? 0 points 0 points  What time? 0 points 0 points  Count back from 20 0 points 0 points  Months in reverse 0 points 0 points  Repeat phrase 0 points 0 points  Total Score 0 0    Immunization History  Administered Date(s) Administered  . Fluad Quad(high Dose 65+) 06/06/2019  . Influenza Split 07/18/2014  . Influenza, High Dose Seasonal PF 07/01/2015, 06/13/2016, 05/23/2017, 06/25/2018  .  Influenza,inj,Quad PF,6+ Mos 06/11/2013  . Pneumococcal Conjugate-13 12/18/2013  . Pneumococcal Polysaccharide-23 07/01/2015  . Tdap 11/15/2016  . Zoster Recombinat (Shingrix) 06/25/2018, 07/13/2018   Screening Tests Health Maintenance  Topic Date Due  . MAMMOGRAM  12/18/2019  . TETANUS/TDAP  11/15/2026  . INFLUENZA VACCINE  Completed  . DEXA SCAN  Completed  . PNA vac Low Risk Adult  Completed     Plan:   Keep all routine maintenance appointments.   Follow up today with your doctor 07/25/19 12:00  I have personally reviewed and noted the following in the patient's chart:   . Medical and social history . Use of alcohol, tobacco or illicit drugs  . Current medications and supplements . Functional ability and status . Nutritional status . Physical activity . Advanced directives . List of other physicians . Hospitalizations, surgeries, and ER visits in previous 12 months . Vitals . Screenings to include cognitive, depression, and falls . Referrals and appointments  In addition, I have reviewed and discussed with patient certain preventive protocols, quality metrics, and best practice recommendations. A written personalized care plan for preventive services as well as general preventive health recommendations were provided to patient.     Varney Biles, LPN  [phone]  Reviewed above information.  Agree with assessment and plan.    Dr Nicki Reaper

## 2019-07-26 DIAGNOSIS — M6281 Muscle weakness (generalized): Secondary | ICD-10-CM | POA: Diagnosis not present

## 2019-07-26 DIAGNOSIS — M25512 Pain in left shoulder: Secondary | ICD-10-CM | POA: Diagnosis not present

## 2019-07-27 ENCOUNTER — Encounter: Payer: Self-pay | Admitting: Internal Medicine

## 2019-07-27 NOTE — Assessment & Plan Note (Signed)
Controlled.  

## 2019-07-27 NOTE — Assessment & Plan Note (Signed)
Mammogram 11/28/18 - [facility] II

## 2019-07-27 NOTE — Assessment & Plan Note (Signed)
On lovastatin.  Follow lipid panel and liver function tests.   

## 2019-07-27 NOTE — Addendum Note (Signed)
Addended by: Alisa Graff on: 07/27/2019 08:37 AM   Modules accepted: Level of Service

## 2019-07-27 NOTE — Assessment & Plan Note (Signed)
Being followed by pulmonary.

## 2019-07-27 NOTE — Assessment & Plan Note (Signed)
Pulmonary planning to schedule sleep study.

## 2019-07-27 NOTE — Assessment & Plan Note (Signed)
S/p XRT.  On thyroid replacement.  Follow tsh.

## 2019-07-27 NOTE — Assessment & Plan Note (Signed)
Blood pressure has been doing well.  Follow pressures.  Follow metabolic panel.   

## 2019-07-27 NOTE — Assessment & Plan Note (Signed)
Rate controlled.  Doing well on current regimen.  Follow.

## 2019-07-27 NOTE — Assessment & Plan Note (Signed)
Followed by pulmonary.  Using flutter valve.  Rarely needs albuterol.  Stable.

## 2019-07-27 NOTE — Assessment & Plan Note (Signed)
Follow cbc.  

## 2019-07-27 NOTE — Assessment & Plan Note (Signed)
Low carb diet and exercise.  Follow met b and a1c.  

## 2019-07-29 DIAGNOSIS — H353221 Exudative age-related macular degeneration, left eye, with active choroidal neovascularization: Secondary | ICD-10-CM | POA: Diagnosis not present

## 2019-07-29 DIAGNOSIS — M6281 Muscle weakness (generalized): Secondary | ICD-10-CM | POA: Diagnosis not present

## 2019-07-29 DIAGNOSIS — M25512 Pain in left shoulder: Secondary | ICD-10-CM | POA: Diagnosis not present

## 2019-07-30 DIAGNOSIS — M25552 Pain in left hip: Secondary | ICD-10-CM | POA: Diagnosis not present

## 2019-07-30 DIAGNOSIS — R2689 Other abnormalities of gait and mobility: Secondary | ICD-10-CM | POA: Diagnosis not present

## 2019-07-31 DIAGNOSIS — L570 Actinic keratosis: Secondary | ICD-10-CM | POA: Diagnosis not present

## 2019-07-31 DIAGNOSIS — M25512 Pain in left shoulder: Secondary | ICD-10-CM | POA: Diagnosis not present

## 2019-07-31 DIAGNOSIS — D485 Neoplasm of uncertain behavior of skin: Secondary | ICD-10-CM | POA: Diagnosis not present

## 2019-07-31 DIAGNOSIS — D2339 Other benign neoplasm of skin of other parts of face: Secondary | ICD-10-CM | POA: Diagnosis not present

## 2019-07-31 DIAGNOSIS — L821 Other seborrheic keratosis: Secondary | ICD-10-CM | POA: Diagnosis not present

## 2019-07-31 DIAGNOSIS — L718 Other rosacea: Secondary | ICD-10-CM | POA: Diagnosis not present

## 2019-07-31 DIAGNOSIS — M6281 Muscle weakness (generalized): Secondary | ICD-10-CM | POA: Diagnosis not present

## 2019-08-01 DIAGNOSIS — R2689 Other abnormalities of gait and mobility: Secondary | ICD-10-CM | POA: Diagnosis not present

## 2019-08-01 DIAGNOSIS — M25552 Pain in left hip: Secondary | ICD-10-CM | POA: Diagnosis not present

## 2019-08-02 DIAGNOSIS — M25512 Pain in left shoulder: Secondary | ICD-10-CM | POA: Diagnosis not present

## 2019-08-02 DIAGNOSIS — R2689 Other abnormalities of gait and mobility: Secondary | ICD-10-CM | POA: Diagnosis not present

## 2019-08-02 DIAGNOSIS — M25552 Pain in left hip: Secondary | ICD-10-CM | POA: Diagnosis not present

## 2019-08-02 DIAGNOSIS — M6281 Muscle weakness (generalized): Secondary | ICD-10-CM | POA: Diagnosis not present

## 2019-08-05 DIAGNOSIS — M6281 Muscle weakness (generalized): Secondary | ICD-10-CM | POA: Diagnosis not present

## 2019-08-05 DIAGNOSIS — M25512 Pain in left shoulder: Secondary | ICD-10-CM | POA: Diagnosis not present

## 2019-08-06 ENCOUNTER — Other Ambulatory Visit: Payer: Self-pay | Admitting: Internal Medicine

## 2019-08-06 ENCOUNTER — Other Ambulatory Visit: Payer: Self-pay

## 2019-08-06 DIAGNOSIS — M25512 Pain in left shoulder: Secondary | ICD-10-CM | POA: Diagnosis not present

## 2019-08-06 DIAGNOSIS — M25552 Pain in left hip: Secondary | ICD-10-CM | POA: Diagnosis not present

## 2019-08-06 DIAGNOSIS — R2689 Other abnormalities of gait and mobility: Secondary | ICD-10-CM | POA: Diagnosis not present

## 2019-08-06 DIAGNOSIS — M6281 Muscle weakness (generalized): Secondary | ICD-10-CM | POA: Diagnosis not present

## 2019-08-08 ENCOUNTER — Other Ambulatory Visit (INDEPENDENT_AMBULATORY_CARE_PROVIDER_SITE_OTHER): Payer: Medicare Other

## 2019-08-08 ENCOUNTER — Other Ambulatory Visit: Payer: Self-pay

## 2019-08-08 DIAGNOSIS — I10 Essential (primary) hypertension: Secondary | ICD-10-CM

## 2019-08-08 DIAGNOSIS — R739 Hyperglycemia, unspecified: Secondary | ICD-10-CM | POA: Diagnosis not present

## 2019-08-08 DIAGNOSIS — E7800 Pure hypercholesterolemia, unspecified: Secondary | ICD-10-CM

## 2019-08-08 DIAGNOSIS — M25552 Pain in left hip: Secondary | ICD-10-CM | POA: Diagnosis not present

## 2019-08-08 DIAGNOSIS — R2689 Other abnormalities of gait and mobility: Secondary | ICD-10-CM | POA: Diagnosis not present

## 2019-08-08 LAB — LIPID PANEL
Cholesterol: 161 mg/dL (ref 0–200)
HDL: 38.7 mg/dL — ABNORMAL LOW (ref >39.00)
LDL Cholesterol: 87 mg/dL (ref 0–99)
NonHDL: 121.99
Total CHOL/HDL Ratio: 4
Triglycerides: 176 mg/dL — ABNORMAL HIGH (ref 0.0–149.0)
VLDL: 35.2 mg/dL (ref 0.0–40.0)

## 2019-08-08 LAB — HEPATIC FUNCTION PANEL
ALT: 12 U/L (ref 0–35)
AST: 17 U/L (ref 0–37)
Albumin: 4.1 g/dL (ref 3.5–5.2)
Alkaline Phosphatase: 116 U/L (ref 39–117)
Bilirubin, Direct: 0.1 mg/dL (ref 0.0–0.3)
Total Bilirubin: 0.4 mg/dL (ref 0.2–1.2)
Total Protein: 6.7 g/dL (ref 6.0–8.3)

## 2019-08-08 LAB — BASIC METABOLIC PANEL
BUN: 13 mg/dL (ref 6–23)
CO2: 33 mEq/L — ABNORMAL HIGH (ref 19–32)
Calcium: 9.9 mg/dL (ref 8.4–10.5)
Chloride: 104 mEq/L (ref 96–112)
Creatinine, Ser: 0.9 mg/dL (ref 0.40–1.20)
GFR: 59.35 mL/min — ABNORMAL LOW (ref >60.00)
Glucose, Bld: 140 mg/dL — ABNORMAL HIGH (ref 70–99)
Potassium: 4.3 mEq/L (ref 3.5–5.1)
Sodium: 141 mEq/L (ref 135–145)

## 2019-08-08 LAB — HEMOGLOBIN A1C: Hgb A1c MFr Bld: 6.3 % (ref 4.6–6.5)

## 2019-08-09 DIAGNOSIS — M6281 Muscle weakness (generalized): Secondary | ICD-10-CM | POA: Diagnosis not present

## 2019-08-09 DIAGNOSIS — M25512 Pain in left shoulder: Secondary | ICD-10-CM | POA: Diagnosis not present

## 2019-08-12 DIAGNOSIS — M6281 Muscle weakness (generalized): Secondary | ICD-10-CM | POA: Diagnosis not present

## 2019-08-12 DIAGNOSIS — R2689 Other abnormalities of gait and mobility: Secondary | ICD-10-CM | POA: Diagnosis not present

## 2019-08-12 DIAGNOSIS — M25512 Pain in left shoulder: Secondary | ICD-10-CM | POA: Diagnosis not present

## 2019-08-12 DIAGNOSIS — M25552 Pain in left hip: Secondary | ICD-10-CM | POA: Diagnosis not present

## 2019-08-13 DIAGNOSIS — R2689 Other abnormalities of gait and mobility: Secondary | ICD-10-CM | POA: Diagnosis not present

## 2019-08-13 DIAGNOSIS — M25552 Pain in left hip: Secondary | ICD-10-CM | POA: Diagnosis not present

## 2019-08-14 DIAGNOSIS — M6281 Muscle weakness (generalized): Secondary | ICD-10-CM | POA: Diagnosis not present

## 2019-08-14 DIAGNOSIS — M25512 Pain in left shoulder: Secondary | ICD-10-CM | POA: Diagnosis not present

## 2019-08-15 DIAGNOSIS — M25512 Pain in left shoulder: Secondary | ICD-10-CM | POA: Diagnosis not present

## 2019-08-15 DIAGNOSIS — M6281 Muscle weakness (generalized): Secondary | ICD-10-CM | POA: Diagnosis not present

## 2019-08-16 DIAGNOSIS — M25552 Pain in left hip: Secondary | ICD-10-CM | POA: Diagnosis not present

## 2019-08-16 DIAGNOSIS — R2689 Other abnormalities of gait and mobility: Secondary | ICD-10-CM | POA: Diagnosis not present

## 2019-08-17 ENCOUNTER — Other Ambulatory Visit: Payer: Self-pay | Admitting: Internal Medicine

## 2019-08-19 DIAGNOSIS — R2689 Other abnormalities of gait and mobility: Secondary | ICD-10-CM | POA: Diagnosis not present

## 2019-08-19 DIAGNOSIS — M25552 Pain in left hip: Secondary | ICD-10-CM | POA: Diagnosis not present

## 2019-08-20 DIAGNOSIS — M25552 Pain in left hip: Secondary | ICD-10-CM | POA: Diagnosis not present

## 2019-08-20 DIAGNOSIS — R2689 Other abnormalities of gait and mobility: Secondary | ICD-10-CM | POA: Diagnosis not present

## 2019-08-21 DIAGNOSIS — M6281 Muscle weakness (generalized): Secondary | ICD-10-CM | POA: Diagnosis not present

## 2019-08-21 DIAGNOSIS — M25512 Pain in left shoulder: Secondary | ICD-10-CM | POA: Diagnosis not present

## 2019-08-23 DIAGNOSIS — M6281 Muscle weakness (generalized): Secondary | ICD-10-CM | POA: Diagnosis not present

## 2019-08-23 DIAGNOSIS — M25512 Pain in left shoulder: Secondary | ICD-10-CM | POA: Diagnosis not present

## 2019-08-24 DIAGNOSIS — R2689 Other abnormalities of gait and mobility: Secondary | ICD-10-CM | POA: Diagnosis not present

## 2019-08-24 DIAGNOSIS — M25552 Pain in left hip: Secondary | ICD-10-CM | POA: Diagnosis not present

## 2019-08-26 DIAGNOSIS — R2689 Other abnormalities of gait and mobility: Secondary | ICD-10-CM | POA: Diagnosis not present

## 2019-08-26 DIAGNOSIS — M6281 Muscle weakness (generalized): Secondary | ICD-10-CM | POA: Diagnosis not present

## 2019-08-26 DIAGNOSIS — M25512 Pain in left shoulder: Secondary | ICD-10-CM | POA: Diagnosis not present

## 2019-08-26 DIAGNOSIS — M25552 Pain in left hip: Secondary | ICD-10-CM | POA: Diagnosis not present

## 2019-08-27 DIAGNOSIS — M6281 Muscle weakness (generalized): Secondary | ICD-10-CM | POA: Diagnosis not present

## 2019-08-27 DIAGNOSIS — M25512 Pain in left shoulder: Secondary | ICD-10-CM | POA: Diagnosis not present

## 2019-08-28 DIAGNOSIS — C4491 Basal cell carcinoma of skin, unspecified: Secondary | ICD-10-CM | POA: Diagnosis not present

## 2019-08-28 DIAGNOSIS — D2339 Other benign neoplasm of skin of other parts of face: Secondary | ICD-10-CM | POA: Diagnosis not present

## 2019-08-28 DIAGNOSIS — R2689 Other abnormalities of gait and mobility: Secondary | ICD-10-CM | POA: Diagnosis not present

## 2019-08-28 DIAGNOSIS — M25552 Pain in left hip: Secondary | ICD-10-CM | POA: Diagnosis not present

## 2019-08-29 DIAGNOSIS — M6281 Muscle weakness (generalized): Secondary | ICD-10-CM | POA: Diagnosis not present

## 2019-08-29 DIAGNOSIS — M25512 Pain in left shoulder: Secondary | ICD-10-CM | POA: Diagnosis not present

## 2019-08-30 DIAGNOSIS — M6281 Muscle weakness (generalized): Secondary | ICD-10-CM | POA: Diagnosis not present

## 2019-08-30 DIAGNOSIS — M25512 Pain in left shoulder: Secondary | ICD-10-CM | POA: Diagnosis not present

## 2019-09-02 DIAGNOSIS — M25512 Pain in left shoulder: Secondary | ICD-10-CM | POA: Diagnosis not present

## 2019-09-02 DIAGNOSIS — M6281 Muscle weakness (generalized): Secondary | ICD-10-CM | POA: Diagnosis not present

## 2019-09-03 DIAGNOSIS — M25552 Pain in left hip: Secondary | ICD-10-CM | POA: Diagnosis not present

## 2019-09-03 DIAGNOSIS — R2689 Other abnormalities of gait and mobility: Secondary | ICD-10-CM | POA: Diagnosis not present

## 2019-09-04 DIAGNOSIS — M25552 Pain in left hip: Secondary | ICD-10-CM | POA: Diagnosis not present

## 2019-09-04 DIAGNOSIS — M6281 Muscle weakness (generalized): Secondary | ICD-10-CM | POA: Diagnosis not present

## 2019-09-04 DIAGNOSIS — R2689 Other abnormalities of gait and mobility: Secondary | ICD-10-CM | POA: Diagnosis not present

## 2019-09-04 DIAGNOSIS — M25512 Pain in left shoulder: Secondary | ICD-10-CM | POA: Diagnosis not present

## 2019-09-05 DIAGNOSIS — M6281 Muscle weakness (generalized): Secondary | ICD-10-CM | POA: Diagnosis not present

## 2019-09-05 DIAGNOSIS — M25512 Pain in left shoulder: Secondary | ICD-10-CM | POA: Diagnosis not present

## 2019-09-06 DIAGNOSIS — R2689 Other abnormalities of gait and mobility: Secondary | ICD-10-CM | POA: Diagnosis not present

## 2019-09-06 DIAGNOSIS — M25552 Pain in left hip: Secondary | ICD-10-CM | POA: Diagnosis not present

## 2019-09-09 DIAGNOSIS — M25512 Pain in left shoulder: Secondary | ICD-10-CM | POA: Diagnosis not present

## 2019-09-09 DIAGNOSIS — M6281 Muscle weakness (generalized): Secondary | ICD-10-CM | POA: Diagnosis not present

## 2019-09-10 DIAGNOSIS — R2689 Other abnormalities of gait and mobility: Secondary | ICD-10-CM | POA: Diagnosis not present

## 2019-09-10 DIAGNOSIS — M25552 Pain in left hip: Secondary | ICD-10-CM | POA: Diagnosis not present

## 2019-09-11 ENCOUNTER — Other Ambulatory Visit: Payer: Self-pay | Admitting: Internal Medicine

## 2019-09-11 DIAGNOSIS — M25512 Pain in left shoulder: Secondary | ICD-10-CM | POA: Diagnosis not present

## 2019-09-11 DIAGNOSIS — M6281 Muscle weakness (generalized): Secondary | ICD-10-CM | POA: Diagnosis not present

## 2019-09-12 DIAGNOSIS — M25512 Pain in left shoulder: Secondary | ICD-10-CM | POA: Diagnosis not present

## 2019-09-12 DIAGNOSIS — M6281 Muscle weakness (generalized): Secondary | ICD-10-CM | POA: Diagnosis not present

## 2019-09-13 ENCOUNTER — Ambulatory Visit
Admission: RE | Admit: 2019-09-13 | Discharge: 2019-09-13 | Disposition: A | Discharge status: Home / Self Care | Payer: Medicare Other | Source: Ambulatory Visit | Attending: Internal Medicine | Admitting: Internal Medicine

## 2019-09-13 ENCOUNTER — Other Ambulatory Visit: Payer: Self-pay

## 2019-09-13 ENCOUNTER — Other Ambulatory Visit: Payer: Self-pay | Admitting: Cardiovascular Disease

## 2019-09-13 DIAGNOSIS — J189 Pneumonia, unspecified organism: Secondary | ICD-10-CM | POA: Diagnosis not present

## 2019-09-13 DIAGNOSIS — M25552 Pain in left hip: Secondary | ICD-10-CM | POA: Diagnosis not present

## 2019-09-13 DIAGNOSIS — J479 Bronchiectasis, uncomplicated: Secondary | ICD-10-CM | POA: Diagnosis not present

## 2019-09-13 DIAGNOSIS — R05 Cough: Secondary | ICD-10-CM | POA: Insufficient documentation

## 2019-09-13 DIAGNOSIS — R2689 Other abnormalities of gait and mobility: Secondary | ICD-10-CM | POA: Diagnosis not present

## 2019-09-13 DIAGNOSIS — R059 Cough, unspecified: Secondary | ICD-10-CM

## 2019-09-16 DIAGNOSIS — M25512 Pain in left shoulder: Secondary | ICD-10-CM | POA: Diagnosis not present

## 2019-09-16 DIAGNOSIS — M6281 Muscle weakness (generalized): Secondary | ICD-10-CM | POA: Diagnosis not present

## 2019-09-17 ENCOUNTER — Other Ambulatory Visit: Payer: Self-pay | Admitting: Internal Medicine

## 2019-09-17 ENCOUNTER — Other Ambulatory Visit: Payer: Self-pay

## 2019-09-17 DIAGNOSIS — R2689 Other abnormalities of gait and mobility: Secondary | ICD-10-CM | POA: Diagnosis not present

## 2019-09-17 DIAGNOSIS — M25512 Pain in left shoulder: Secondary | ICD-10-CM | POA: Diagnosis not present

## 2019-09-17 DIAGNOSIS — M25552 Pain in left hip: Secondary | ICD-10-CM | POA: Diagnosis not present

## 2019-09-17 DIAGNOSIS — M6281 Muscle weakness (generalized): Secondary | ICD-10-CM | POA: Diagnosis not present

## 2019-09-17 MED ORDER — GABAPENTIN 300 MG PO CAPS: ORAL_CAPSULE | ORAL | 1 refills | Status: DC

## 2019-09-18 DIAGNOSIS — M6281 Muscle weakness (generalized): Secondary | ICD-10-CM | POA: Diagnosis not present

## 2019-09-18 DIAGNOSIS — M25512 Pain in left shoulder: Secondary | ICD-10-CM | POA: Diagnosis not present

## 2019-09-19 DIAGNOSIS — M25552 Pain in left hip: Secondary | ICD-10-CM | POA: Diagnosis not present

## 2019-09-19 DIAGNOSIS — R2689 Other abnormalities of gait and mobility: Secondary | ICD-10-CM | POA: Diagnosis not present

## 2019-09-23 DIAGNOSIS — M25552 Pain in left hip: Secondary | ICD-10-CM | POA: Diagnosis not present

## 2019-09-23 DIAGNOSIS — R2689 Other abnormalities of gait and mobility: Secondary | ICD-10-CM | POA: Diagnosis not present

## 2019-09-25 DIAGNOSIS — M25512 Pain in left shoulder: Secondary | ICD-10-CM | POA: Diagnosis not present

## 2019-09-25 DIAGNOSIS — M25552 Pain in left hip: Secondary | ICD-10-CM | POA: Diagnosis not present

## 2019-09-25 DIAGNOSIS — M6281 Muscle weakness (generalized): Secondary | ICD-10-CM | POA: Diagnosis not present

## 2019-09-25 DIAGNOSIS — R2689 Other abnormalities of gait and mobility: Secondary | ICD-10-CM | POA: Diagnosis not present

## 2019-09-27 DIAGNOSIS — M6281 Muscle weakness (generalized): Secondary | ICD-10-CM | POA: Diagnosis not present

## 2019-09-27 DIAGNOSIS — M25512 Pain in left shoulder: Secondary | ICD-10-CM | POA: Diagnosis not present

## 2019-10-03 DIAGNOSIS — M6281 Muscle weakness (generalized): Secondary | ICD-10-CM | POA: Diagnosis not present

## 2019-10-03 DIAGNOSIS — M25512 Pain in left shoulder: Secondary | ICD-10-CM | POA: Diagnosis not present

## 2019-10-04 DIAGNOSIS — M6281 Muscle weakness (generalized): Secondary | ICD-10-CM | POA: Diagnosis not present

## 2019-10-04 DIAGNOSIS — M25512 Pain in left shoulder: Secondary | ICD-10-CM | POA: Diagnosis not present

## 2019-10-07 DIAGNOSIS — M25512 Pain in left shoulder: Secondary | ICD-10-CM | POA: Diagnosis not present

## 2019-10-07 DIAGNOSIS — M6281 Muscle weakness (generalized): Secondary | ICD-10-CM | POA: Diagnosis not present

## 2019-10-09 DIAGNOSIS — M6281 Muscle weakness (generalized): Secondary | ICD-10-CM | POA: Diagnosis not present

## 2019-10-09 DIAGNOSIS — M25512 Pain in left shoulder: Secondary | ICD-10-CM | POA: Diagnosis not present

## 2019-10-10 ENCOUNTER — Other Ambulatory Visit: Payer: Self-pay | Admitting: General Surgery

## 2019-10-10 DIAGNOSIS — Z1231 Encounter for screening mammogram for malignant neoplasm of breast: Secondary | ICD-10-CM

## 2019-10-11 ENCOUNTER — Telehealth: Payer: Self-pay

## 2019-10-11 DIAGNOSIS — M25512 Pain in left shoulder: Secondary | ICD-10-CM | POA: Diagnosis not present

## 2019-10-11 DIAGNOSIS — M6281 Muscle weakness (generalized): Secondary | ICD-10-CM | POA: Diagnosis not present

## 2019-10-11 NOTE — Telephone Encounter (Signed)
Call to the patient about her annual mammogram and follow up with our office. She states that she will be seeing Dr Bary Castilla for all of her breast care.

## 2019-10-15 DIAGNOSIS — M25512 Pain in left shoulder: Secondary | ICD-10-CM | POA: Diagnosis not present

## 2019-10-15 DIAGNOSIS — M6281 Muscle weakness (generalized): Secondary | ICD-10-CM | POA: Diagnosis not present

## 2019-10-16 DIAGNOSIS — M6281 Muscle weakness (generalized): Secondary | ICD-10-CM | POA: Diagnosis not present

## 2019-10-16 DIAGNOSIS — M25512 Pain in left shoulder: Secondary | ICD-10-CM | POA: Diagnosis not present

## 2019-10-17 DIAGNOSIS — M25512 Pain in left shoulder: Secondary | ICD-10-CM | POA: Diagnosis not present

## 2019-10-17 DIAGNOSIS — M6281 Muscle weakness (generalized): Secondary | ICD-10-CM | POA: Diagnosis not present

## 2019-10-20 ENCOUNTER — Other Ambulatory Visit: Payer: Self-pay

## 2019-10-20 ENCOUNTER — Observation Stay
Admission: EM | Admit: 2019-10-20 | Discharge: 2019-10-21 | Disposition: A | Discharge status: Home / Self Care | Payer: Medicare Other | Attending: Internal Medicine | Admitting: Internal Medicine

## 2019-10-20 ENCOUNTER — Emergency Department: Payer: Medicare Other

## 2019-10-20 DIAGNOSIS — Z853 Personal history of malignant neoplasm of breast: Secondary | ICD-10-CM | POA: Insufficient documentation

## 2019-10-20 DIAGNOSIS — I4891 Unspecified atrial fibrillation: Secondary | ICD-10-CM | POA: Diagnosis present

## 2019-10-20 DIAGNOSIS — Z7901 Long term (current) use of anticoagulants: Secondary | ICD-10-CM | POA: Diagnosis not present

## 2019-10-20 DIAGNOSIS — E785 Hyperlipidemia, unspecified: Secondary | ICD-10-CM | POA: Diagnosis not present

## 2019-10-20 DIAGNOSIS — E039 Hypothyroidism, unspecified: Secondary | ICD-10-CM | POA: Diagnosis not present

## 2019-10-20 DIAGNOSIS — I493 Ventricular premature depolarization: Secondary | ICD-10-CM | POA: Insufficient documentation

## 2019-10-20 DIAGNOSIS — Z85850 Personal history of malignant neoplasm of thyroid: Secondary | ICD-10-CM | POA: Diagnosis not present

## 2019-10-20 DIAGNOSIS — I10 Essential (primary) hypertension: Secondary | ICD-10-CM | POA: Diagnosis present

## 2019-10-20 DIAGNOSIS — I110 Hypertensive heart disease with heart failure: Secondary | ICD-10-CM | POA: Insufficient documentation

## 2019-10-20 DIAGNOSIS — G4733 Obstructive sleep apnea (adult) (pediatric): Secondary | ICD-10-CM

## 2019-10-20 DIAGNOSIS — Z79890 Hormone replacement therapy (postmenopausal): Secondary | ICD-10-CM | POA: Insufficient documentation

## 2019-10-20 DIAGNOSIS — F419 Anxiety disorder, unspecified: Secondary | ICD-10-CM | POA: Insufficient documentation

## 2019-10-20 DIAGNOSIS — I499 Cardiac arrhythmia, unspecified: Secondary | ICD-10-CM | POA: Diagnosis not present

## 2019-10-20 DIAGNOSIS — Z8711 Personal history of peptic ulcer disease: Secondary | ICD-10-CM | POA: Diagnosis not present

## 2019-10-20 DIAGNOSIS — Z743 Need for continuous supervision: Secondary | ICD-10-CM | POA: Diagnosis not present

## 2019-10-20 DIAGNOSIS — I491 Atrial premature depolarization: Secondary | ICD-10-CM | POA: Diagnosis not present

## 2019-10-20 DIAGNOSIS — I5032 Chronic diastolic (congestive) heart failure: Secondary | ICD-10-CM | POA: Insufficient documentation

## 2019-10-20 DIAGNOSIS — F329 Major depressive disorder, single episode, unspecified: Secondary | ICD-10-CM | POA: Insufficient documentation

## 2019-10-20 DIAGNOSIS — R251 Tremor, unspecified: Secondary | ICD-10-CM | POA: Insufficient documentation

## 2019-10-20 DIAGNOSIS — Z86718 Personal history of other venous thrombosis and embolism: Secondary | ICD-10-CM | POA: Insufficient documentation

## 2019-10-20 DIAGNOSIS — J42 Unspecified chronic bronchitis: Secondary | ICD-10-CM | POA: Diagnosis not present

## 2019-10-20 DIAGNOSIS — Z923 Personal history of irradiation: Secondary | ICD-10-CM | POA: Insufficient documentation

## 2019-10-20 DIAGNOSIS — K219 Gastro-esophageal reflux disease without esophagitis: Secondary | ICD-10-CM | POA: Insufficient documentation

## 2019-10-20 DIAGNOSIS — R0789 Other chest pain: Secondary | ICD-10-CM | POA: Diagnosis not present

## 2019-10-20 DIAGNOSIS — Z85820 Personal history of malignant melanoma of skin: Secondary | ICD-10-CM | POA: Insufficient documentation

## 2019-10-20 DIAGNOSIS — I4819 Other persistent atrial fibrillation: Secondary | ICD-10-CM | POA: Diagnosis not present

## 2019-10-20 DIAGNOSIS — R079 Chest pain, unspecified: Secondary | ICD-10-CM | POA: Diagnosis not present

## 2019-10-20 DIAGNOSIS — E7800 Pure hypercholesterolemia, unspecified: Secondary | ICD-10-CM | POA: Diagnosis not present

## 2019-10-20 DIAGNOSIS — Z79899 Other long term (current) drug therapy: Secondary | ICD-10-CM | POA: Diagnosis not present

## 2019-10-20 DIAGNOSIS — Z20822 Contact with and (suspected) exposure to covid-19: Secondary | ICD-10-CM | POA: Diagnosis not present

## 2019-10-20 DIAGNOSIS — I480 Paroxysmal atrial fibrillation: Secondary | ICD-10-CM | POA: Diagnosis not present

## 2019-10-20 DIAGNOSIS — M1990 Unspecified osteoarthritis, unspecified site: Secondary | ICD-10-CM | POA: Diagnosis not present

## 2019-10-20 LAB — CBC
HCT: 41.4 % (ref 36.0–46.0)
Hemoglobin: 13.1 g/dL (ref 12.0–15.0)
MCH: 29.9 pg (ref 26.0–34.0)
MCHC: 31.6 g/dL (ref 30.0–36.0)
MCV: 94.5 fL (ref 80.0–100.0)
Platelets: 228 10*3/uL (ref 150–400)
RBC: 4.38 MIL/uL (ref 3.87–5.11)
RDW: 11.9 % (ref 11.5–15.5)
WBC: 7.5 10*3/uL (ref 4.0–10.5)
nRBC: 0 % (ref 0.0–0.2)

## 2019-10-20 LAB — FIBRIN DERIVATIVES D-DIMER (ARMC ONLY): Fibrin derivatives D-dimer (ARMC): 443.61 ng/mL (FEU) (ref 0.00–499.00)

## 2019-10-20 LAB — COMPREHENSIVE METABOLIC PANEL
ALT: 13 U/L (ref 0–44)
AST: 18 U/L (ref 15–41)
Albumin: 3.5 g/dL (ref 3.5–5.0)
Alkaline Phosphatase: 110 U/L (ref 38–126)
Anion gap: 10 (ref 5–15)
BUN: 16 mg/dL (ref 8–23)
CO2: 26 mmol/L (ref 22–32)
Calcium: 9.8 mg/dL (ref 8.9–10.3)
Chloride: 103 mmol/L (ref 98–111)
Creatinine, Ser: 1.01 mg/dL — ABNORMAL HIGH (ref 0.44–1.00)
GFR calc Af Amer: 58 mL/min — ABNORMAL LOW (ref >60)
GFR calc non Af Amer: 50 mL/min — ABNORMAL LOW (ref >60)
Glucose, Bld: 104 mg/dL — ABNORMAL HIGH (ref 70–99)
Potassium: 4.3 mmol/L (ref 3.5–5.1)
Sodium: 139 mmol/L (ref 135–145)
Total Bilirubin: 0.7 mg/dL (ref 0.3–1.2)
Total Protein: 6.5 g/dL (ref 6.5–8.1)

## 2019-10-20 LAB — TROPONIN I (HIGH SENSITIVITY)
Troponin I (High Sensitivity): 7 ng/L (ref <18)
Troponin I (High Sensitivity): 7 ng/L (ref <18)

## 2019-10-20 LAB — POC SARS CORONAVIRUS 2 AG: SARS Coronavirus 2 Ag: NEGATIVE

## 2019-10-20 NOTE — ED Provider Notes (Signed)
[facility] Emergency Department Provider Note  ____________________________________________   First MD Initiated Contact with Patient 10/20/19 1910     (approximate)  I have reviewed the triage vital signs and the nursing notes.   HISTORY  Chief Complaint Chest Pain   HPI Kerri Mills is a 86 y.o. female who presents to the emergency department via EMS after episode of chest pain. She states the pain felt like "an electrical shock sensation" in the midsternal area that radiated up into the right lateral neck.  She was standing in her doorway talking with her neighbors when the pain started.  Symptoms lasted a minute or 2.  EMS was called.  Upon EMS arrival, EKG was completed which showed frequent PVCs.  She denies history of MI.  She does have a history of atrial fibrillation.  Currently, she is chest pain-free.  She denies any shortness of breath, nausea, vomiting, or dizziness with the episode of chest pain. Patient took 324mg  of baby ASA prior to arrival.  Past Medical History:  Diagnosis Date  . Allergy   . Anxiety   . Arthritis   . Clotting disorder ([place])   . Colitis   . Depression   . Diverticulitis 2013  . Gastric ulcer   . GERD (gastroesophageal reflux disease)   . Hypercholesterolemia   . Hypertension   . Infiltrating lobular carcinoma of left breast 2011   T2,N0, ER: 90%; PR 0%; Her 2 neu not amplified. [facility]).  . Melanoma ([place]) 1997  . Melanoma in situ of upper extremity ([place]) 03/19/2011  . Persistent atrial fibrillation ([place])    a. CHADS2VASc => 4 (HTN, age x 2, female)  . Personal history of radiation therapy 2011   BREAST CA  . Seroma    HISTORY OF LFT BREAST  . Sleep apnea   . Thyroid cancer [facility]) 1992    Patient Active Problem List   Diagnosis Date Noted  . 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 Left hip pain 06/06/2019  . Encounter for completion of form with patient 03/03/2019  . Abnormal chest CT 12/22/2018  . SOB  (shortness of breath) 11/25/2018  . Varicose veins of both lower extremities with inflammation 09/21/2018  . Swelling of lower extremity 08/13/2018  . Right knee pain 05/30/2018  . Fall 05/30/2018  . Sprain of knee 05/30/2018  . Abrasion, knee 05/30/2018  . Osteoarthritis of knee 05/30/2018  . Anemia 02/15/2018  . Bilateral knee pain 11/12/2017  . Atrial fibrillation with rapid ventricular response ([place])   . Demand ischemia ([place])   . Essential hypertension   . Chest pain 09/29/2017  . Dermatitis 09/26/2017  . Osteoarthritis of both knees 06/12/2017  . Osteoporosis, senile 06/12/2017  . Right leg swelling 05/25/2017  . Joint ache 05/25/2017  . Hypercholesterolemia 02/17/2017  . Cough 01/23/2017  . Hyperglycemia 06/18/2016  . Abnormal mammogram of left breast 01/21/2015  . Knee pain, left 11/19/2014  . Diverticulosis of colon without hemorrhage 05/07/2014  . Diverticulosis of large intestine 05/07/2014  . History of breast cancer 01/20/2014  . Stress 06/15/2013  . Obstructive sleep apnea 06/15/2013  . Atrial fibrillation ([place]) 06/15/2013  . Posttraumatic hematoma of left breast 02/19/2013  . Lump or mass in breast 02/19/2013  . History of thyroid cancer 03/19/2011  . GERD (gastroesophageal reflux disease)   . GERD 05/26/2009    Past Surgical History:  Procedure Laterality Date  . ABDOMINAL HYSTERECTOMY  1973   partial  . BREAST BIOPSY Left  02-13-13   BENIGN BREAST TISSUE WITH CHANGES CONSISTENT WITH FAT NECROSIS  . BREAST BIOPSY Left 01/21/2015   bx done in [place] office 11:00 left 6-8cmfn  . BREAST EXCISIONAL BIOPSY Left 1995   neg  . BREAST EXCISIONAL BIOPSY Left 2011   Breast cancer radiation  . BREAST LUMPECTOMY Left 2011   BREAST CA  . CARDIAC CATHETERIZATION    . CHOLECYSTECTOMY    . COLONOSCOPY  2013  . MELANOMA EXCISION     RT UPPER ARM  . PARTIAL HYSTERECTOMY     bleeding, ovaries in place.    . THYROID SURGERY  1992   FOR THYROID CANCER  .  TONSILLECTOMY      Prior to Admission medications   Medication Sig Start Date End Date Taking? Authorizing Provider  albuterol (PROAIR HFA) 108 (90 Base) MCG/ACT inhaler Inhale 2 puffs into the lungs every 6 (six) hours as needed for wheezing or shortness of breath. 08/10/18  Yes Einar Pheasant, MD  Cholecalciferol (VITAMIN D) 1000 UNITS capsule Take 1,000 Units by mouth daily.     Yes [provider]  DULoxetine (CYMBALTA) 60 MG capsule TAKE 1 CAPSULE (60 MG TOTAL) BY MOUTH AT BEDTIME. 09/11/19  Yes Scott, Randell Patient, MD  ELIQUIS 5 MG TABS tablet TAKE 1 TABLET BY MOUTH TWICE A DAY 03/11/19  Yes Gollan, Kathlene November, MD  furosemide (LASIX) 20 MG tablet TAKE 1 TABLET (20 MG TOTAL) BY MOUTH AS DIRECTED. TAKE 1 TABLET (20 MG) TWICE A WEEK WITH POTASSIUM 09/13/19 12/12/19 Yes Gollan, Kathlene November, MD  lisinopril (ZESTRIL) 20 MG tablet TAKE 1 TABLET BY MOUTH EVERY DAY 03/22/19  Yes Einar Pheasant, MD  Multiple Vitamins-Minerals (PRESERVISION AREDS 2) CAPS Take 1 tablet by mouth 2 (two) times daily.    Yes [provider]  omeprazole (PRILOSEC) 20 MG capsule TAKE 1 CAPSULE (20 MG TOTAL) BY MOUTH DAILY. 11/23/18  Yes Einar Pheasant, MD  potassium chloride (K-DUR) 10 MEQ tablet Take 2 tablets (20 mEq total) by mouth as directed. Take 2 tablets (20 mEq) twice a week with Lasix 10/03/18  Yes Gollan, Kathlene November, MD  gabapentin (NEURONTIN) 300 MG capsule TAKE 2 CAPSULES (600 MG TOTAL) BY MOUTH AT BEDTIME. 09/17/19   Einar Pheasant, MD  levothyroxine (SYNTHROID) 88 MCG tablet Take 1 tablet (88 mcg total) by mouth daily before breakfast. TAKE ONE TABLET BY MOUTH ONCE DAILY BEFORE BREAKFAST 10/21/19   Enzo Bi, MD  lovastatin (MEVACOR) 40 MG tablet Take 1 tablet (40 mg total) by mouth at bedtime. 10/21/19   Enzo Bi, MD  metoprolol succinate (TOPROL XL) 25 MG 24 hr tablet Take 0.5 tablets (12.5 mg total) by mouth daily. This is the last dose of Toprol you were on. 10/21/19 11/20/19  Enzo Bi, MD    triamcinolone cream (KENALOG) 0.1 % Apply 1 application topically as needed (rash). 10/21/19   Enzo Bi, MD    Allergies Atorvastatin, Lipitor [atorvastatin calcium], and Penicillins  Family History  Problem Relation Age of Onset  . Heart disease Mother   . Cancer Brother        lung   . Cancer Sister        breast  . Breast cancer Neg Hx     Social History Social History   Tobacco Use  . Smoking status: Never Smoker  . Smokeless tobacco: Never Used  Substance Use Topics  . Alcohol use: Yes    Alcohol/week: 0.0 standard drinks    Comment: social drinking. average times a  week  . Drug use: No    Review of Systems  Constitutional: No fever/chills. Eyes: No visual changes. ENT: No sore throat. Cardiovascular: Positive for chest pain.  Negative for pleuritic pain.  Negative for palpitations.  Negative for leg pain. Respiratory: Negative for shortness of breath. Gastrointestinal: No abdominal pain.  Negative for nausea, no vomiting.  No diarrhea.  No constipation. Genitourinary: Negative for dysuria. Musculoskeletal: Negative for back pain.  Skin: Negative for rash, lesion, wound. Neurological: Negative for headaches, focal weakness or numbness. ____________________________________________   PHYSICAL EXAM:  Today's Vitals   10/21/19 1130 10/21/19 1200 10/21/19 1300 10/21/19 1652  BP:      Pulse:      Resp: 16 18 17    Temp:      TempSrc:      SpO2:      Weight:      Height:      PainSc:    0-No pain   Body mass index is 29.95 kg/m.  Constitutional: Alert and oriented.  Well appearing and in no acute distress.  Normal mental status. Eyes: Conjunctivae are normal. PERRL. Head: Atraumatic. Nose: No congestion/rhinnorhea. Mouth/Throat: Mucous membranes are moist.  Oropharynx non-erythematous. Tongue normal in size and color. Neck: No stridor.  No carotid bruit appreciated on exam. Hematological/Lymphatic/Immunilogical: No cervical  lymphadenopathy. Cardiovascular: Normal rate, regular rhythm. Grossly normal heart sounds.  Good peripheral circulation. Respiratory: Normal respiratory effort.  No retractions. Lungs CTAB. Gastrointestinal: Soft and nontender. No distention. No abdominal bruits. No CVA tenderness. Genitourinary: Exam deferred. Musculoskeletal: No lower extremity tenderness.  No edema of extremities. Neurologic:  Normal speech and language. No gross focal neurologic deficits are appreciated. Skin:  Skin is warm, dry and intact. No rash noted. Psychiatric: Mood and affect are normal. Speech and behavior are normal.  ____________________________________________   LABS (all labs ordered are listed, but only abnormal results are displayed)  Labs Reviewed  COMPREHENSIVE METABOLIC PANEL - Abnormal; Notable for the following components:      Result Value   Glucose, Bld 104 (*)    Creatinine, Ser 1.01 (*)    GFR calc non Af Amer 50 (*)    GFR calc Af Amer 58 (*)    All other components within normal limits  LIPID PANEL - Abnormal; Notable for the following components:   Triglycerides 195 (*)    HDL 33 (*)    LDL Cholesterol 103 (*)    All other components within normal limits  COMPREHENSIVE METABOLIC PANEL - Abnormal; Notable for the following components:   Glucose, Bld 114 (*)    Total Protein 6.3 (*)    GFR calc non Af Amer 59 (*)    All other components within normal limits  SARS CORONAVIRUS 2 (TAT 6-24 HRS)  CBC  FIBRIN DERIVATIVES D-DIMER ([facility] ONLY)  MAGNESIUM  MAGNESIUM  PHOSPHORUS  TSH  CBC  POC SARS CORONAVIRUS 2 AG -  ED  POC SARS CORONAVIRUS 2 AG  TROPONIN I (HIGH SENSITIVITY)  TROPONIN I (HIGH SENSITIVITY)  TROPONIN I (HIGH SENSITIVITY)   ____________________________________________  EKG  ED ECG REPORT I, Damyen Knoll, FNP-BC personally viewed and interpreted this ECG.   Date: 10/20/2019  EKG Time: 1915  Rate: 80  Rhythm: normal sinus rhythm, frequent PVC's noted  Axis:  normal  Intervals:none  ST&T Change: no ST elevation  ED ECG REPORT I, Sherrie George, FNP-BC personally viewed and interpreted this ECG.   Date: 10/20/2019  EKG Time: 1917  Rate: 81  Rhythm: normal sinus  rhythm, frequent PVC's noted  Axis: normal  Intervals:none  ST&T Change: no ST elevation   ____________________________________________  RADIOLOGY  ED MD interpretation:  No acute findings on chest x-ray  Official radiology report(s): No results found.  ____________________________________________   PROCEDURES  Procedure(s) performed: None  Procedures  Critical Care performed: No  ____________________________________________   INITIAL IMPRESSION / ASSESSMENT AND PLAN / ED COURSE  86 year old female presenting to the emergency department for treatment and evaluation after having an episode of chest pain.  See HPI for further details.  Patient is currently pain-free.  Plan will be to perform a cardiac work-up.  No known exposure to COVID-19.  Differential diagnosis includes but is not limited to: Cardiac event, PE, pneumonia  ED Course  EKG shows frequent PVCs. Patient asymptomatic.   ----------------------------------------- 8:35 PM on 10/20/2019 -----------------------------------------  Initial troponin is reassuring at 7.  CBC is unremarkable as is the CMP.  D-dimer is negative.  Chest x-ray shows no acute cardiopulmonary abnormality per radiology.  Patient care transitioned to Dr. Joan Mayans for final assessment, diagnosis, and disposition. ____________________________________________   FINAL CLINICAL IMPRESSION(S) / ED DIAGNOSES  Final diagnoses:  Chest discomfort  PVC (premature ventricular contraction)     ED Discharge Orders         Ordered    lovastatin (MEVACOR) 40 MG tablet  Daily at bedtime     10/21/19 1623    levothyroxine (SYNTHROID) 88 MCG tablet  Daily before breakfast     10/21/19 1623    triamcinolone cream (KENALOG) 0.1 %  As  needed     10/21/19 1623    metoprolol succinate (TOPROL XL) 25 MG 24 hr tablet  Daily     10/21/19 1623    Increase activity slowly     10/21/19 1623    Diet - low sodium heart healthy     10/21/19 1623           Note:  This document was prepared using Dragon voice recognition software and may include unintentional dictation errors.   Victorino Dike, FNP 10/23/19 1710    Lilia Pro., MD 10/23/19 [phone]

## 2019-10-20 NOTE — H&P (Signed)
Kerri Mills [id number] DOB: 06-Jun-1931 DOA: 10/20/2019     PCP: Einar Pheasant, MD   Outpatient Specialists:  CARDS:  Dr. Rockey Situ [facility]   Pulmonary    Dr. Ottie Glazier     Patient arrived to ER on 10/20/19 at [place]  Patient coming from:  From facility [facility] independent living  Chief Complaint:   Chief Complaint  Patient presents with  . Chest Pain    HPI: Kerri Mills is a 86 y.o. female with medical history significant of chronic bronchitis bronchiectasis followed by pulmonology,  colitis peptic ulcer disease, HLD, HTN GERD, breast cancer 2011, melanoma, atrial fibrillation on Eliquis, remote history of DVT in her left leg in 1990s after thyroid surgery  Presented with sudden chest pain started 6 PM today while standing outside radiated to her right arm she took 4 baby aspirin at the time it did radiate to her jaw . She had that happen before and it always goes to the right side.  She was talking to her friends at the time the pain was significant enough that she had to sit down. The discomfort lasted 2-3 min. She felt a bit weak all over.  Currently chest pain-free EMS called on arrival EKG showed frequent PVCs no associated shortness of breath no nausea no vomiting no dizziness  Patient states that 2 years ago she had similar episode at that time she had Nuclear medicine study done that was low risk.  Apparently patient in the past used to be on metoprolol but that had to be discontinued because of generalized fatigue bradycardia and hypotension she remained on her propranolol 3 times a day but when eventually her pills have run out she wanted to have this refilled.  She contacted her primary care provider who referred her to cardiology she called cardiology office but have not heard back meanwhile she ran out of her propranolol was unable to take it anymore. She would like to go back to as it helps her her tremors  Infectious risk factors:  Reports   dry cough, chest  pain,    In  ER RAPID COVID TEST  NEGATIVE     Lab Results  Component Value Date   [place] Not Detected 03/15/2019     Regarding pertinent Chronic problems:     Hyperlipidemia -  on statins Mevacor   HTN on lisinopril propranolol takes it mainly for tremor   chronic CHF diastolic  - last echo [id number] -showing preserved EF 55-60% but impaired relaxation with mild pericardial effusion  Bronchiectasis followed by pulmonology uses flutter valve   Remote history of breast cancer status post radiation therapy   Hypothyroidism:  Lab Results  Component Value Date   TSH 2.990 11/09/2018   on synthroid Status post thyroid cancer status post XRT and now on thyroid replacement    OSA -followed by pulmonology was plan to obtain sleep study prior to Covid     A. Fib -  - CHA2DS2 vas score 4  :  currently  on anticoagulation with   Eliquis,           -  Rate control:  Currently controlled with propranolol but patient has been off of it recently because it has not been refilled    While in ER:  trop and d.d.imer unremarkable   The following Work up has been ordered so far:  Orders Placed This Encounter  Procedures  . DG Chest Port 1 View  . CBC  .  Comprehensive metabolic panel  . Fibrin derivatives D-Dimer  . Magnesium  . Cardiac monitoring  . Consult to hospitalist  ALL PATIENTS BEING ADMITTED/HAVING PROCEDURES NEED COVID-19 SCREENING  . Pulse oximetry, continuous  . EKG 12-Lead  . ED EKG  . EKG 12-Lead  . EKG 12-Lead  . Insert peripheral IV     Following Medications were ordered in ER: Medications - No data to display      Consult Orders  (From admission, onward)         Start     Ordered   10/20/19 2235  Consult to hospitalist  ALL PATIENTS BEING ADMITTED/HAVING PROCEDURES NEED COVID-19 SCREENING  Once    Comments: ALL PATIENTS BEING ADMITTED/HAVING PROCEDURES NEED COVID-19 SCREENING  Provider:  (Not yet assigned)  Question Answer Comment  Place call  to: hospitalist   Reason for Consult Admit   Diagnosis/Clinical Info for Consult: chest pain r/o, sig PVC on monitor, tele monitoring      10/20/19 2234           Significant initial  Findings: Abnormal Labs Reviewed  COMPREHENSIVE METABOLIC PANEL - Abnormal; Notable for the following components:      Result Value   Glucose, Bld 104 (*)    Creatinine, Ser 1.01 (*)    GFR calc non Af Amer 50 (*)    GFR calc Af Amer 58 (*)    All other components within normal limits   Otherwise labs showing:    Recent Labs  Lab 10/20/19 1940  NA 139  K 4.3  CO2 26  GLUCOSE 104*  BUN 16  CREATININE 1.01*  CALCIUM 9.8    Cr    stable,    Lab Results  Component Value Date   CREATININE 1.01 (H) 10/20/2019   CREATININE 0.90 08/08/2019   CREATININE 0.84 11/21/2018    Recent Labs  Lab 10/20/19 1940  AST 18  ALT 13  ALKPHOS 110  BILITOT 0.7  PROT 6.5  ALBUMIN 3.5   Lab Results  Component Value Date   CALCIUM 9.8 10/20/2019     WBC      Component Value Date/Time   WBC 7.5 10/20/2019 1940   ANC    Component Value Date/Time   NEUTROABS 3.4 11/21/2018 1235   ALC No components found for: LYMPHAB    Plt: Lab Results  Component Value Date   PLT 228 10/20/2019     COVID-19 Labs  No results for input(s): DDIMER, FERRITIN, LDH, CRP in the last 72 hours.  Lab Results  Component Value Date   [place] Not Detected 03/15/2019    HG/HCT  stable,       Component Value Date/Time   HGB 13.1 10/20/2019 1940   HGB 12.1 11/09/2018 1133   HCT 41.4 10/20/2019 1940   HCT 37.8 11/09/2018 1133     Troponin 7   ECG: Ordered Personally reviewed by me showing: HR : 91 Rhythm: A. fib, frequent PVCs   no evidence of ischemic changes QTC 457   BNP (last 3 results) Recent Labs    11/09/18 1153  BNP 362.6*    ProBNP (last 3 results) No results for input(s): PROBNP in the last 8760 hours.  DM  labs:  HbA1C: Recent Labs    11/21/18 1235 08/08/19 0903    HGBA1C 6.1 6.3       UA  not ordered    Ordered    CXR - NON acute     ED Triage Vitals [10/20/19 1923]  Enc Vitals Group     BP (!) 161/104     Pulse Rate 75     Resp 17     Temp      Temp src      SpO2 97 %     Weight 180 lb (81.6 kg)     Height 5\' 5"  (1.651 m)     Head Circumference      Peak Flow      Pain Score 0     Pain Loc      Pain Edu?      Excl. in [place]?   [id number]       Latest  Blood pressure (!) 139/92, pulse 81, resp. rate 18, height 5\' 5"  (1.651 m), weight 81.6 kg, SpO2 92 %.     Hospitalist was called for admission for chest pain evaluation   Review of Systems:    Pertinent positives include: chest pain   Constitutional:  No weight loss, night sweats, Fevers, chills, fatigue, weight loss  HEENT:  No headaches, Difficulty swallowing,Tooth/dental problems,Sore throat,  No sneezing, itching, ear ache, nasal congestion, post nasal drip,  Cardio-vascular:  No chest pain, Orthopnea, PND, anasarca, dizziness, palpitations.no Bilateral lower extremity swelling  GI:  No heartburn, indigestion, abdominal pain, nausea, vomiting, diarrhea, change in bowel habits, loss of appetite, melena, blood in stool, hematemesis Resp:  no shortness of breath at rest. No dyspnea on exertion, No excess mucus, no productive cough, No non-productive cough, No coughing up of blood.No change in color of mucus.No wheezing. Skin:  no rash or lesions. No jaundice GU:  no dysuria, change in color of urine, no urgency or frequency. No straining to urinate.  No flank pain.  Musculoskeletal:  No joint pain or no joint swelling. No decreased range of motion. No back pain.  Psych:  No change in mood or affect. No depression or anxiety. No memory loss.  Neuro: no localizing neurological complaints, no tingling, no weakness, no double vision, no gait abnormality, no slurred speech, no confusion  All systems reviewed and apart from [place] all are negative  Past Medical History:    Past Medical History:  Diagnosis Date  . Allergy   . Anxiety   . Arthritis   . Clotting disorder ([place])   . Colitis   . Depression   . Diverticulitis 2013  . Gastric ulcer   . GERD (gastroesophageal reflux disease)   . Hypercholesterolemia   . Hypertension   . Infiltrating lobular carcinoma of left breast 2011   T2,N0, ER: 90%; PR 0%; Her 2 neu not amplified. [facility]).  . Melanoma ([place]) 1997  . Melanoma in situ of upper extremity ([place]) 03/19/2011  . Persistent atrial fibrillation ([place])    a. CHADS2VASc => 4 (HTN, age x 2, female)  . Personal history of radiation therapy 2011   BREAST CA  . Seroma    HISTORY OF LFT BREAST  . Sleep apnea   . Thyroid cancer ([place]) 1992     Past Surgical History:  Procedure Laterality Date  . ABDOMINAL HYSTERECTOMY  1973   partial  . BREAST BIOPSY Left 02-13-13   BENIGN BREAST TISSUE WITH CHANGES CONSISTENT WITH FAT NECROSIS  . BREAST BIOPSY Left 01/21/2015   bx done in [facility] office 11:00 left 6-8cmfn  . BREAST EXCISIONAL BIOPSY Left 1995   neg  . BREAST EXCISIONAL BIOPSY Left 2011   Breast cancer radiation  . BREAST LUMPECTOMY Left 2011   BREAST CA  . CARDIAC  CATHETERIZATION    . CHOLECYSTECTOMY    . COLONOSCOPY  2013  . MELANOMA EXCISION     RT UPPER ARM  . PARTIAL HYSTERECTOMY     bleeding, ovaries in place.    . THYROID SURGERY  1992   FOR THYROID CANCER  . TONSILLECTOMY      Social History:  Ambulatory    Independently      reports that she has never smoked. She has never used smokeless tobacco. She reports current alcohol use. She reports that she does not use drugs.     Family History:   Family History  Problem Relation Age of Onset  . Heart disease Mother   . Cancer Brother        lung   . Cancer Sister        breast  . Breast cancer Neg Hx     Allergies: Allergies  Allergen Reactions  . Atorvastatin     Other reaction(s): Other (See Comments) STIFFNESS AND SORE STIFFNESS AND SORE  .  Lipitor [Atorvastatin Calcium] Other (See Comments)    Stiffness & soreness  . Penicillins Rash    REACTION: Unknown reaction     Prior to Admission medications   Medication Sig Start Date End Date Taking? Authorizing Provider  albuterol (PROAIR HFA) 108 (90 Base) MCG/ACT inhaler Inhale 2 puffs into the lungs every 6 (six) hours as needed for wheezing or shortness of breath. 08/10/18   Einar Pheasant, MD  Cholecalciferol (VITAMIN D) 1000 UNITS capsule Take 1,000 Units by mouth daily.      [provider]  DULoxetine (CYMBALTA) 60 MG capsule TAKE 1 CAPSULE (60 MG TOTAL) BY MOUTH AT BEDTIME. 09/11/19   Einar Pheasant, MD  ELIQUIS 5 MG TABS tablet TAKE 1 TABLET BY MOUTH TWICE A DAY 03/11/19   Gollan, Kathlene November, MD  furosemide (LASIX) 20 MG tablet TAKE 1 TABLET (20 MG TOTAL) BY MOUTH AS DIRECTED. TAKE 1 TABLET (20 MG) TWICE A WEEK WITH POTASSIUM 09/13/19 12/12/19  Minna Merritts, MD  gabapentin (NEURONTIN) 300 MG capsule TAKE 2 CAPSULES (600 MG TOTAL) BY MOUTH AT BEDTIME. 09/17/19   Einar Pheasant, MD  levothyroxine (SYNTHROID) 88 MCG tablet TAKE ONE TABLET BY MOUTH ONCE DAILY BEFORE BREAKFAST 09/11/19   Einar Pheasant, MD  lisinopril (ZESTRIL) 20 MG tablet TAKE 1 TABLET BY MOUTH EVERY DAY 03/22/19   Einar Pheasant, MD  lovastatin (MEVACOR) 40 MG tablet TAKE 1 TABLET BY MOUTH EVERY DAY 06/13/19   Crecencio Mc, MD  Multiple Vitamins-Minerals (PRESERVISION AREDS 2) CAPS Take 1 tablet by mouth 2 (two) times daily.     [provider]  omeprazole (PRILOSEC) 20 MG capsule TAKE 1 CAPSULE (20 MG TOTAL) BY MOUTH DAILY. 11/23/18   Einar Pheasant, MD  potassium chloride (K-DUR) 10 MEQ tablet Take 2 tablets (20 mEq total) by mouth as directed. Take 2 tablets (20 mEq) twice a week with Lasix 10/03/18   Gollan, Kathlene November, MD  propranolol (INDERAL) 10 MG tablet Take 1 tablet (10 mg total) by mouth 3 (three) times daily as needed. 03/05/19   Minna Merritts, MD  triamcinolone cream  (KENALOG) 0.1 % Apply 1 application topically 2 (two) times daily. Patient taking differently: Apply 1 application topically as needed.  08/10/18   Einar Pheasant, MD   Physical Exam: Blood pressure (!) 139/92, pulse 81, resp. rate 18, height 5\' 5"  (1.651 m), weight 81.6 kg, SpO2 92 %. 1. General:  in No  Acute distress  well  -appearing 2. Psychological: Alert and   Oriented 3. Head/ENT:   Dry Mucous Membranes                          Head Non traumatic, neck supple                            Poor Dentition 4. SKIN:   decreased Skin turgor,  Skin clean Dry and intact no rash 5. Heart: Regular rate and rhythm no  Murmur, no Rub or gallop 6. Lungs:  Clear to auscultation bilaterally, no wheezes or crackles   7. Abdomen: Soft,  non-tender, Non distended     8. Lower extremities: no clubbing, cyanosis, trace edema in left leg chronic 9. Neurologically Grossly intact, moving all 4 extremities equally  10. MSK: Normal range of motion   All other LABS:     Recent Labs  Lab 10/20/19 1940  WBC 7.5  HGB 13.1  HCT 41.4  MCV 94.5  PLT 228     Recent Labs  Lab 10/20/19 1940  NA 139  K 4.3  CL 103  CO2 26  GLUCOSE 104*  BUN 16  CREATININE 1.01*  CALCIUM 9.8     Recent Labs  Lab 10/20/19 1940  AST 18  ALT 13  ALKPHOS 110  BILITOT 0.7  PROT 6.5  ALBUMIN 3.5       Cultures: No results found for: SDES, [place], CULT, REPTSTATUS   Radiological Exams on Admission: DG Chest Port 1 View  Result Date: 10/20/2019 CLINICAL DATA:  Chest pain EXAM: PORTABLE CHEST 1 VIEW COMPARISON:  09/03/2018 FINDINGS: Heart and mediastinal contours are within normal limits. No focal opacities or effusions. No acute bony abnormality. IMPRESSION: No active disease. Electronically Signed   By: Rolm Baptise M.D.   On: 10/20/2019 20:04  2  Chart has been reviewed    Assessment/Plan  86 y.o. female with medical history significant of chronic bronchitis bronchiectasis followed by  pulmonology,  colitis peptic ulcer disease, HLD, HTN GERD, breast cancer 2011, melanoma, atrial fibrillation on Eliquis, remote history of DVT in her left leg in 1990s after thyroid surgery Admitted for atypical chest pain associated with frequent PVCs negative troponin and D-dimer nonischemic EKG  Present on Admission: . Chest pain - - H=   1  ,E= 1 ,A=  2  , R  2 , T 0 ,  for the  Total of 6 therefore will admit for observation and further evaluation ( Risk of MACE: Scores 0-3  of 0.9-1.7%.,  4-6: 12-16.6% , Scores ?7: 50-65% ) - PE less likely pt on Eliquis - troponin unremarkable  -No acute ischemic changes on ECG   -  Other explanation for chest pain could be musculoskeletal vs Pulmonary   - monitor on telemetry, cycle cardiac enzymes, obtain serial ECG and  ECHO in AM.   - Daily aspirin -  Further risk stratify with lipid panel, hgA1C, obtain TSH.  Make sure patient is on Aspirin.  We will notify cardiology regarding patient's admission. Further management depends on pending  workup  . GERD - chronic continue home meds  . Obstructive sleep apnea -was supposed to get further evaluation as an outpatient with pulmonology  . Atrial fibrillation ([place]) -           - CHA2DS2 vas score 4  : continue current anticoagulation with dose Eliquis,     -  Rate control:  Currently controlled will resume Propranol    . Frequent PVCs -resume propranolol she has been taking it only once a day, will need further discussion with cardiology regarding long term medication adjustment  . Hypercholesterolemia - chronic stable continue home meds  . Essential hypertension - currently stable restart propranolol monitor for hypotension  Hx of Tremors - restart propranolol   Other plan as per orders.  DVT prophylaxis:  Eliquis  Code Status:  FULL CODE   as per patient   I had personally discussed CODE STATUS with patient    Family Communication:   Family not at  Bedside   Disposition Plan:                             Back to current facility when stable                                                                     Consults called:  notified cardiology    Admission status:  ED Disposition    None      Obs     Level of care   tele  For 12H    Precautions: admitted as  asymptomatic screening protocol  No active isolations    PPE: Used by the provider:   P100  eye Goggles,  Gloves  gown     Magdelyn Roebuck 10/21/2019, 1:13 AM    [facility]     after 2 AM please page floor coverage PA If 7AM-7PM, please contact the day team taking care of the patient using [email or web address]   Patient was evaluated in the context of the global COVID-19 pandemic, which necessitated consideration that the patient might be at risk for infection with the SARS-CoV-2 virus that causes COVID-19. Institutional protocols and algorithms that pertain to the evaluation of patients at risk for COVID-19 are in a state of rapid change based on information released by regulatory bodies including the CDC and federal and state organizations. These policies and algorithms were followed during the patient's care.

## 2019-10-20 NOTE — ED Triage Notes (Signed)
Pt with sudden onset chest pain around 6 pm while standing outside, radiating to right arm. Pt took 4 baby ASA at that time. Pt denies chest pain at this time. Pt said it affected her jaw and her speech for a short time. Pt denies this problem at the moment. Pt from [facility] independent living,

## 2019-10-21 ENCOUNTER — Telehealth: Payer: Self-pay | Admitting: Cardiovascular Disease

## 2019-10-21 ENCOUNTER — Observation Stay (HOSPITAL_BASED_OUTPATIENT_CLINIC_OR_DEPARTMENT_OTHER)
Admit: 2019-10-21 | Discharge: 2019-10-21 | Disposition: A | Discharge status: Home / Self Care | Payer: Medicare Other | Attending: Hospitalist | Admitting: Hospitalist

## 2019-10-21 DIAGNOSIS — R55 Syncope and collapse: Secondary | ICD-10-CM | POA: Diagnosis not present

## 2019-10-21 DIAGNOSIS — I10 Essential (primary) hypertension: Secondary | ICD-10-CM

## 2019-10-21 DIAGNOSIS — R079 Chest pain, unspecified: Secondary | ICD-10-CM

## 2019-10-21 DIAGNOSIS — I493 Ventricular premature depolarization: Secondary | ICD-10-CM | POA: Diagnosis not present

## 2019-10-21 DIAGNOSIS — R0789 Other chest pain: Secondary | ICD-10-CM | POA: Diagnosis not present

## 2019-10-21 DIAGNOSIS — I4891 Unspecified atrial fibrillation: Secondary | ICD-10-CM

## 2019-10-21 LAB — LIPID PANEL
Cholesterol: 175 mg/dL (ref 0–200)
HDL: 33 mg/dL — ABNORMAL LOW (ref >40)
LDL Cholesterol: 103 mg/dL — ABNORMAL HIGH (ref 0–99)
Total CHOL/HDL Ratio: 5.3 RATIO
Triglycerides: 195 mg/dL — ABNORMAL HIGH (ref <150)
VLDL: 39 mg/dL (ref 0–40)

## 2019-10-21 LAB — COMPREHENSIVE METABOLIC PANEL
ALT: 11 U/L (ref 0–44)
AST: 15 U/L (ref 15–41)
Albumin: 3.5 g/dL (ref 3.5–5.0)
Alkaline Phosphatase: 98 U/L (ref 38–126)
Anion gap: 7 (ref 5–15)
BUN: 15 mg/dL (ref 8–23)
CO2: 30 mmol/L (ref 22–32)
Calcium: 9.5 mg/dL (ref 8.9–10.3)
Chloride: 106 mmol/L (ref 98–111)
Creatinine, Ser: 0.88 mg/dL (ref 0.44–1.00)
GFR calc Af Amer: >60 mL/min (ref >60)
GFR calc non Af Amer: 59 mL/min — ABNORMAL LOW (ref >60)
Glucose, Bld: 114 mg/dL — ABNORMAL HIGH (ref 70–99)
Potassium: 4.2 mmol/L (ref 3.5–5.1)
Sodium: 143 mmol/L (ref 135–145)
Total Bilirubin: 0.7 mg/dL (ref 0.3–1.2)
Total Protein: 6.3 g/dL — ABNORMAL LOW (ref 6.5–8.1)

## 2019-10-21 LAB — SARS CORONAVIRUS 2 (TAT 6-24 HRS): SARS Coronavirus 2: NEGATIVE

## 2019-10-21 LAB — ECHOCARDIOGRAM COMPLETE
Height: 65 in
Weight: 2880 oz

## 2019-10-21 LAB — TROPONIN I (HIGH SENSITIVITY): Troponin I (High Sensitivity): 8 ng/L (ref <18)

## 2019-10-21 LAB — CBC
HCT: 38.8 % (ref 36.0–46.0)
Hemoglobin: 12.4 g/dL (ref 12.0–15.0)
MCH: 30.2 pg (ref 26.0–34.0)
MCHC: 32 g/dL (ref 30.0–36.0)
MCV: 94.4 fL (ref 80.0–100.0)
Platelets: 199 10*3/uL (ref 150–400)
RBC: 4.11 MIL/uL (ref 3.87–5.11)
RDW: 12 % (ref 11.5–15.5)
WBC: 5.9 10*3/uL (ref 4.0–10.5)
nRBC: 0 % (ref 0.0–0.2)

## 2019-10-21 LAB — MAGNESIUM
Magnesium: 2 mg/dL (ref 1.7–2.4)
Magnesium: 2 mg/dL (ref 1.7–2.4)

## 2019-10-21 LAB — TSH: TSH: 2.668 u[IU]/mL (ref 0.350–4.500)

## 2019-10-21 LAB — PHOSPHORUS: Phosphorus: 3.9 mg/dL (ref 2.5–4.6)

## 2019-10-21 MED ORDER — APIXABAN 5 MG PO TABS
5.0000 mg | ORAL_TABLET | Freq: Two times a day (BID) | ORAL | Status: DC
  Administered 2019-10-21: 5 mg via ORAL
  Filled 2019-10-21: qty 1

## 2019-10-21 MED ORDER — PRAVASTATIN SODIUM 40 MG PO TABS
40.0000 mg | ORAL_TABLET | Freq: Every day | ORAL | Status: DC
  Filled 2019-10-21: qty 1

## 2019-10-21 MED ORDER — PROPRANOLOL HCL 20 MG PO TABS: 10.0000 mg | ORAL_TABLET | Freq: Every evening | ORAL | Status: DC

## 2019-10-21 MED ORDER — ONDANSETRON HCL 4 MG/2ML IJ SOLN: 4.0000 mg | Freq: Four times a day (QID) | INTRAMUSCULAR | Status: DC | PRN

## 2019-10-21 MED ORDER — LEVOTHYROXINE SODIUM 88 MCG PO TABS: 88.0000 ug | ORAL_TABLET | Freq: Every day | ORAL | Status: DC

## 2019-10-21 MED ORDER — SODIUM CHLORIDE 0.9 % IV SOLN: INTRAVENOUS | Status: AC

## 2019-10-21 MED ORDER — ALBUTEROL SULFATE HFA 108 (90 BASE) MCG/ACT IN AERS
2.0000 | INHALATION_SPRAY | Freq: Four times a day (QID) | RESPIRATORY_TRACT | Status: DC | PRN
  Filled 2019-10-21: qty 6.7

## 2019-10-21 MED ORDER — ASPIRIN EC 81 MG PO TBEC: 81.0000 mg | DELAYED_RELEASE_TABLET | Freq: Every day | ORAL | Status: DC

## 2019-10-21 MED ORDER — PANTOPRAZOLE SODIUM 40 MG PO TBEC
40.0000 mg | DELAYED_RELEASE_TABLET | Freq: Every day | ORAL | Status: DC
  Administered 2019-10-21: 40 mg via ORAL
  Filled 2019-10-21: qty 1

## 2019-10-21 MED ORDER — ACETAMINOPHEN 650 MG RE SUPP: 650.0000 mg | Freq: Four times a day (QID) | RECTAL | Status: DC | PRN

## 2019-10-21 MED ORDER — METOPROLOL SUCCINATE ER 25 MG PO TB24: 12.5000 mg | ORAL_TABLET | Freq: Every day | ORAL | 0 refills | Status: DC

## 2019-10-21 MED ORDER — LOVASTATIN 40 MG PO TABS: 40.0000 mg | ORAL_TABLET | Freq: Every day | ORAL | Status: DC

## 2019-10-21 MED ORDER — TRIAMCINOLONE ACETONIDE 0.1 % EX CREA: 1.0000 "application " | TOPICAL_CREAM | CUTANEOUS | Status: DC | PRN

## 2019-10-21 MED ORDER — DULOXETINE HCL 60 MG PO CPEP: 60.0000 mg | ORAL_CAPSULE | Freq: Every day | ORAL | Status: DC

## 2019-10-21 MED ORDER — LISINOPRIL 10 MG PO TABS
20.0000 mg | ORAL_TABLET | Freq: Every day | ORAL | Status: DC
  Administered 2019-10-21: 20 mg via ORAL
  Filled 2019-10-21: qty 2

## 2019-10-21 MED ORDER — LEVOTHYROXINE SODIUM 88 MCG PO TABS
88.0000 ug | ORAL_TABLET | Freq: Every day | ORAL | Status: DC
  Administered 2019-10-21: 88 ug via ORAL
  Filled 2019-10-21: qty 1

## 2019-10-21 MED ORDER — ONDANSETRON HCL 4 MG PO TABS: 4.0000 mg | ORAL_TABLET | Freq: Four times a day (QID) | ORAL | Status: DC | PRN

## 2019-10-21 MED ORDER — GABAPENTIN 300 MG PO CAPS: 600.0000 mg | ORAL_CAPSULE | Freq: Every day | ORAL | Status: DC

## 2019-10-21 MED ORDER — ACETAMINOPHEN 325 MG PO TABS
650.0000 mg | ORAL_TABLET | Freq: Four times a day (QID) | ORAL | Status: DC | PRN
  Administered 2019-10-21: 650 mg via ORAL
  Filled 2019-10-21: qty 2

## 2019-10-21 MED ORDER — PROPRANOLOL HCL 20 MG PO TABS
10.0000 mg | ORAL_TABLET | Freq: Every evening | ORAL | Status: DC
  Administered 2019-10-21: 10 mg via ORAL
  Filled 2019-10-21: qty 1

## 2019-10-21 MED ORDER — HYDROCODONE-ACETAMINOPHEN 5-325 MG PO TABS: 1.0000 | ORAL_TABLET | ORAL | Status: DC | PRN

## 2019-10-21 NOTE — Telephone Encounter (Signed)
LVM to schedule TCM appointment

## 2019-10-21 NOTE — Discharge Summary (Signed)
Physician Discharge Summary   EVELEAN WEINBERG  female DOB: 09/08/1931  [id number]  PCP: Einar Pheasant, MD  Admit date: 10/20/2019 Discharge date: 10/21/2019  Admitted From: home Disposition:  home CODE STATUS: Full code  Discharge Instructions    Diet - low sodium heart healthy   Complete by: As directed    Increase activity slowly   Complete by: As directed        Hospital Course:  For full details, please see H&P, progress notes, consult notes and ancillary notes.  Briefly,  MONICE MIRANTE is a 86 y.o. female with medical history significant of chronic bronchitis bronchiectasis followed by pulmonology,  colitis peptic ulcer disease, HLD, HTN, GERD, breast cancer 2011, melanoma, atrial fibrillation on Eliquis, remote history of DVT in her left leg in 1990s after thyroid surgery who presented with sudden chest pain.  . Chest pain troponin neg, no acute ischemic changes on ECG.  Per cardiology consult, pt has a known history of intermittent chest pain and underwent a Lexiscan in February that showed no evidence of ischemia with normal ejection fraction.  TTE during this admission showed normal LV systolic function and wall motion.  Given that the patient's symptoms are atypical and she had negative ischemic evaluation less than a year ago, cardiology recommended no further work-up at the present time.  Marland Kitchen GERD - chronic  continued home meds  . Obstructive sleep apnea  Planned to get further evaluation as an outpatient with pulmonology  . Atrial fibrillation ([place]) -   Continued home Toprol and Eliquis.  . Hypercholesterolemia - chronic stable continued home meds  . Essential hypertension - currently stable.  Continued Toprol.  Hx of Tremors  Propranolol was not resumed by her outpatient physician prior to presentation, therefore was not ordered discharge.  Need to caution about use of propranolol since pt is already on Toprol.   Discharge Diagnoses:  Active  Problems:   GERD   Obstructive sleep apnea   Atrial fibrillation (HCC)   Hypercholesterolemia   Chest pain   Essential hypertension   Frequent PVCs    Discharge Instructions:  Allergies as of 10/21/2019      Reactions   Atorvastatin    Other reaction(s): Other (See Comments) STIFFNESS AND SORE STIFFNESS AND SORE   Lipitor [atorvastatin Calcium] Other (See Comments)   Stiffness & soreness   Penicillins Rash   REACTION: Unknown reaction      Medication List    TAKE these medications   albuterol 108 (90 Base) MCG/ACT inhaler Commonly known as: ProAir HFA Inhale 2 puffs into the lungs every 6 (six) hours as needed for wheezing or shortness of breath.   DULoxetine 60 MG capsule Commonly known as: CYMBALTA TAKE 1 CAPSULE (60 MG TOTAL) BY MOUTH AT BEDTIME.   Eliquis 5 MG Tabs tablet Generic drug: apixaban TAKE 1 TABLET BY MOUTH TWICE A DAY   furosemide 20 MG tablet Commonly known as: LASIX TAKE 1 TABLET (20 MG TOTAL) BY MOUTH AS DIRECTED. TAKE 1 TABLET (20 MG) TWICE A WEEK WITH POTASSIUM   gabapentin 300 MG capsule Commonly known as: NEURONTIN TAKE 2 CAPSULES (600 MG TOTAL) BY MOUTH AT BEDTIME.   levothyroxine 88 MCG tablet Commonly known as: SYNTHROID Take 1 tablet (88 mcg total) by mouth daily before breakfast. TAKE ONE TABLET BY MOUTH ONCE DAILY BEFORE BREAKFAST What changed: See the new instructions.   lisinopril 20 MG tablet Commonly known as: ZESTRIL TAKE 1 TABLET BY MOUTH EVERY DAY  lovastatin 40 MG tablet Commonly known as: MEVACOR Take 1 tablet (40 mg total) by mouth at bedtime.   metoprolol succinate 25 MG 24 hr tablet Commonly known as: Toprol XL Take 0.5 tablets (12.5 mg total) by mouth daily. This is the last dose of Toprol you were on.   omeprazole 20 MG capsule Commonly known as: PRILOSEC TAKE 1 CAPSULE (20 MG TOTAL) BY MOUTH DAILY.   potassium chloride 10 MEQ tablet Commonly known as: KLOR-CON Take 2 tablets (20 mEq total) by mouth as  directed. Take 2 tablets (20 mEq) twice a week with Lasix   PreserVision AREDS 2 Caps Take 1 tablet by mouth 2 (two) times daily.   triamcinolone cream 0.1 % Commonly known as: KENALOG Apply 1 application topically as needed (rash).   Vitamin D 1000 units capsule Take 1,000 Units by mouth daily.       Follow-up Information    Einar Pheasant, MD. Schedule an appointment as soon as possible for a visit in 1 week(s).   Specialty: Internal Medicine Contact information: [id number] [place] [id number] [phone]        Minna Merritts, MD. Schedule an appointment as soon as possible for a visit in 1 week(s).   Specialty: Cardiology Contact information: [address] [place]  [address] [phone]           Allergies  Allergen Reactions  . Atorvastatin     Other reaction(s): Other (See Comments) STIFFNESS AND SORE STIFFNESS AND SORE  . Lipitor [Atorvastatin Calcium] Other (See Comments)    Stiffness & soreness  . Penicillins Rash    REACTION: Unknown reaction     The results of significant diagnostics from this hospitalization (including imaging, microbiology, ancillary and laboratory) are listed below for reference.   Consultations:   Procedures/Studies: DG Chest Port 1 View  Result Date: 10/20/2019 CLINICAL DATA:  Chest pain EXAM: PORTABLE CHEST 1 VIEW COMPARISON:  09/03/2018 FINDINGS: Heart and mediastinal contours are within normal limits. No focal opacities or effusions. No acute bony abnormality. IMPRESSION: No active disease. Electronically Signed   By: Rolm Baptise M.D.   On: 10/20/2019 20:04   ECHOCARDIOGRAM COMPLETE  Result Date: 10/21/2019   ECHOCARDIOGRAM REPORT   Patient Name:   YULEISY VALDIVIESO Heeg Date of Exam: 10/21/2019 Medical Rec #:  [id number]    Height:       65.0 in Accession #:    [id number]   Weight:       180.0 lb Date of Birth:  1931/08/24    BSA:          1.89 m Patient Age:    7 years     BP:            Not listed/Not listed mmHg Patient Gender: F            HR:           66 bpm. Exam Location:  [facility] Procedure: 2D Echo, Cardiac Doppler and Color Doppler Indications:     Chest pain 786.50  History:         Patient has prior history of Echocardiogram examinations, most                  recent 12/10/2018. Risk Factors:Hypertension. Persisteny Afib.  Sonographer:     Sherrie Sport RDCS (AE) Referring Phys:  [id number] Otila Kluver Odies Desa Diagnosing Phys: Kathlyn Sacramento MD IMPRESSIONS  1. Left ventricular ejection fraction, by visual estimation, is 55  to 60%. The left ventricle has normal function. There is moderately increased left ventricular hypertrophy.  2. Left ventricular diastolic parameters are indeterminate.  3. The left ventricle has no regional wall motion abnormalities.  4. Global right ventricle has normal systolic function.The right ventricular size is normal. No increase in right ventricular wall thickness.  5. Left atrial size was mildly dilated.  6. Right atrial size was normal.  7. The mitral valve is normal in structure. Trivial mitral valve regurgitation. No evidence of mitral stenosis.  8. The tricuspid valve is normal in structure. Tricuspid valve regurgitation is trivial.  9. The aortic valve is normal in structure. Aortic valve regurgitation is not visualized. Mild to moderate aortic valve sclerosis/calcification without any evidence of aortic stenosis. 10. The pulmonic valve was normal in structure. Pulmonic valve regurgitation is not visualized. 11. TR signal is inadequate for assessing pulmonary artery systolic pressure. 12. The inferior vena cava is normal in size with greater than 50% respiratory variability, suggesting right atrial pressure of 3 mmHg. FINDINGS  Left Ventricle: Left ventricular ejection fraction, by visual estimation, is 55 to 60%. The left ventricle has normal function. The left ventricle has no regional wall motion abnormalities. There is moderately increased left ventricular  hypertrophy. Asymmetric left ventricular hypertrophy of the septal wall. Left ventricular diastolic parameters are indeterminate. Normal left atrial pressure. Right Ventricle: The right ventricular size is normal. No increase in right ventricular wall thickness. Global RV systolic function is has normal systolic function. The tricuspid regurgitant velocity is 1.87 m/s, and with an assumed right atrial pressure  of 10 mmHg, the estimated right ventricular systolic pressure is TR signal is inadequate for assessing PA pressure at 24.0 mmHg. Left Atrium: Left atrial size was mildly dilated. Right Atrium: Right atrial size was normal in size Pericardium: There is no evidence of pericardial effusion. Mitral Valve: The mitral valve is normal in structure. Trivial mitral valve regurgitation. No evidence of mitral valve stenosis by observation. Tricuspid Valve: The tricuspid valve is normal in structure. Tricuspid valve regurgitation is trivial. Aortic Valve: The aortic valve is normal in structure. Aortic valve regurgitation is not visualized. Mild to moderate aortic valve sclerosis/calcification is present, without any evidence of aortic stenosis. Aortic valve mean gradient measures 2.0 mmHg. Aortic valve peak gradient measures 4.4 mmHg. Aortic valve area, by VTI measures 3.45 cm. Pulmonic Valve: The pulmonic valve was normal in structure. Pulmonic valve regurgitation is not visualized. Pulmonic regurgitation is not visualized. Aorta: The aortic root, ascending aorta and aortic arch are all structurally normal, with no evidence of dilitation or obstruction. Venous: The inferior vena cava is normal in size with greater than 50% respiratory variability, suggesting right atrial pressure of 3 mmHg. IAS/Shunts: No atrial level shunt detected by color flow Doppler. There is no evidence of a patent foramen ovale. No ventricular septal defect is seen or detected. There is no evidence of an atrial septal defect.  LEFT VENTRICLE  PLAX 2D LVIDd:         4.47 cm  Diastology LVIDs:         2.77 cm  LV e' lateral:   4.90 cm/s LV PW:         1.16 cm  LV E/e' lateral: 13.0 LV IVS:        1.44 cm  LV e' medial:    3.37 cm/s LVOT diam:     2.10 cm  LV E/e' medial:  18.8 LV SV:  62 ml LV SV Index:   31.74 LVOT Area:     3.46 cm  RIGHT VENTRICLE RV Basal diam:  3.40 cm RV S prime:     12.00 cm/s TAPSE (M-mode): 3.0 cm LEFT ATRIUM             Index       RIGHT ATRIUM           Index LA diam:        3.50 cm 1.85 cm/m  RA Area:     14.80 cm LA Vol (A2C):   78.5 ml 41.50 ml/m RA Volume:   34.00 ml  17.98 ml/m LA Vol (A4C):   60.0 ml 31.72 ml/m LA Biplane Vol: 68.6 ml 36.27 ml/m  AORTIC VALVE                   PULMONIC VALVE AV Area (Vmax):    2.52 cm    PV Vmax:        0.68 m/s AV Area (Vmean):   2.99 cm    PV Peak grad:   1.8 mmHg AV Area (VTI):     3.45 cm    RVOT Peak grad: 3 mmHg AV Vmax:           104.55 cm/s AV Vmean:          64.450 cm/s AV VTI:            0.202 m AV Peak Grad:      4.4 mmHg AV Mean Grad:      2.0 mmHg LVOT Vmax:         76.10 cm/s LVOT Vmean:        55.700 cm/s LVOT VTI:          0.201 m LVOT/AV VTI ratio: 1.00  AORTA Ao Root diam: 2.80 cm MITRAL VALVE                        TRICUSPID VALVE MV Area (PHT): 3.12 cm             TR Peak grad:   14.0 mmHg MV PHT:        70.47 msec           TR Vmax:        187.00 cm/s MV Decel Time: 243 msec MV E velocity: 63.50 cm/s 103 cm/s  SHUNTS MV A velocity: 99.40 cm/s 70.3 cm/s Systemic VTI:  0.20 m MV E/A ratio:  0.64       1.5       Systemic Diam: 2.10 cm  Kathlyn Sacramento MD Electronically signed by Kathlyn Sacramento MD Signature Date/Time: 10/21/2019/3:28:07 PM    Final       Labs: BNP (last 3 results) Recent Labs    11/09/18 1153  BNP 123456*   Basic Metabolic Panel: Recent Labs  Lab 10/20/19 1940 10/20/19 2257 10/21/19 0703  NA 139  --  143  K 4.3  --  4.2  CL 103  --  106  CO2 26  --  30  GLUCOSE 104*  --  114*  BUN 16  --  15  CREATININE 1.01*  --   0.88  CALCIUM 9.8  --  9.5  MG  --  2.0 2.0  PHOS  --   --  3.9   Liver Function Tests: Recent Labs  Lab 10/20/19 1940 10/21/19 0703  AST 18 15  ALT 13 11  ALKPHOS 110 98  BILITOT 0.7 0.7  PROT 6.5 6.3*  ALBUMIN 3.5 3.5   No results for input(s): LIPASE, AMYLASE in the last 168 hours. No results for input(s): AMMONIA in the last 168 hours. CBC: Recent Labs  Lab 10/20/19 1940 10/21/19 0703  WBC 7.5 5.9  HGB 13.1 12.4  HCT 41.4 38.8  MCV 94.5 94.4  PLT 228 199   Cardiac Enzymes: No results for input(s): CKTOTAL, CKMB, CKMBINDEX, TROPONINI in the last 168 hours. BNP: Invalid input(s): POCBNP CBG: No results for input(s): GLUCAP in the last 168 hours. D-Dimer No results for input(s): DDIMER in the last 72 hours. Hgb A1c No results for input(s): HGBA1C in the last 72 hours. Lipid Profile No results for input(s): CHOL, HDL, LDLCALC, TRIG, CHOLHDL, LDLDIRECT in the last 72 hours. Thyroid function studies No results for input(s): TSH, T4TOTAL, T3FREE, THYROIDAB in the last 72 hours.  Invalid input(s): FREET3 Anemia work up No results for input(s): VITAMINB12, FOLATE, FERRITIN, TIBC, IRON, RETICCTPCT in the last 72 hours. Urinalysis    Component Value Date/Time   COLORURINE YELLOW (A) 05/22/2018 0920   APPEARANCEUR CLEAR (A) 05/22/2018 0920   LABSPEC 1.017 05/22/2018 0920   PHURINE 5.0 05/22/2018 0920   GLUCOSEU NEGATIVE 05/22/2018 0920   HGBUR NEGATIVE 05/22/2018 0920   BILIRUBINUR NEGATIVE 05/22/2018 0920   KETONESUR NEGATIVE 05/22/2018 0920   PROTEINUR NEGATIVE 05/22/2018 0920   NITRITE NEGATIVE 05/22/2018 0920   LEUKOCYTESUR NEGATIVE 05/22/2018 0920   Sepsis Labs Invalid input(s): PROCALCITONIN,  WBC,  LACTICIDVEN Microbiology Recent Results (from the past 240 hour(s))  SARS CORONAVIRUS 2 (TAT 6-24 HRS) Nasopharyngeal Nasopharyngeal Swab     Status: None   Collection Time: 10/21/19 12:24 AM   Specimen: Nasopharyngeal Swab  Result Value Ref Range Status    SARS Coronavirus 2 NEGATIVE NEGATIVE Final    Comment: (NOTE) SARS-CoV-2 target nucleic acids are NOT DETECTED. The SARS-CoV-2 RNA is generally detectable in upper and lower respiratory specimens during the acute phase of infection. Negative results do not preclude SARS-CoV-2 infection, do not rule out co-infections with other pathogens, and should not be used as the sole basis for treatment or other patient management decisions. Negative results must be combined with clinical observations, patient history, and epidemiological information. The expected result is Negative. Fact Sheet for Patients: SugarRoll.be Fact Sheet for Healthcare Providers: https://www.woods-mathews.com/ This test is not yet approved or cleared by the [place] FDA and  has been authorized for detection and/or diagnosis of SARS-CoV-2 by FDA under an Emergency Use Authorization (EUA). This EUA will remain  in effect (meaning this test can be used) for the duration of the COVID-19 declaration under Section 56 4(b)(1) of the Act, 21 U.S.C. section 360bbb-3(b)(1), unless the authorization is terminated or revoked sooner. Performed at [facility], [place] [address] [place], [place] [address]      Total time spend on discharging this patient, including the last patient exam, discussing the hospital stay, instructions for ongoing care as it relates to all pertinent caregivers, as well as preparing the medical discharge records, prescriptions, and/or referrals as applicable, is 40 minutes.    Enzo Bi, MD  [facility] 10/25/2019, 7:46 PM  If 7PM-7AM, please contact night-coverage

## 2019-10-21 NOTE — ED Notes (Signed)
Pt placed in a hospital bed for better comfort. Pt given her cell phone so she can call her daughter.

## 2019-10-21 NOTE — ED Notes (Signed)
Pt given breakfast and repositioned.

## 2019-10-21 NOTE — Consult Note (Signed)
Cardiology Consultation:   Patient ID: RAUL BROADUS; [id number]; [birth year]   Admit date: 10/20/2019 Date of Consult: 10/21/2019  Primary Care Provider: Einar Pheasant, MD Primary Cardiologist: Rockey Situ   Patient Profile:   Kerri Mills is a 86 y.o. female with a hx of persistent Afib on Eliquis, remote DVT in 1992, LBBB, breast cancer status post lumpectomy and radiation therapy, hypertension, hyperlipidemia, tremor, sleep apnea, lower extremity edema, diverticulitis, thyroid cancer, melanoma, depression, gastric ulcer, and GERD who is being seen today for the evaluation of PVCs at the request of Dr. Roel Cluck.  History of Present Illness:   [name] was admitted to the hospital in 09/2017 with chest pain and A. fib with RVR.  Troponin peaked at 0.03.  Echo showed an EF of 55 to 60%, normal wall motion, moderate concentric LVH, grade 1 diastolic dysfunction, moderately dilated left atrium, RV cavity size was normal with normal RV systolic function, mild tricuspid regurgitation, PASP 28 mmHg, trivial pericardial effusion was identified posterior to the heart. She was rate controlled and developed bradycardia with both metoprolol and diltiazem. She was seen in 10/2018 with "crushing chest pain" two weeks prior of duration of possibly 2-5 minutes. Lexiscan Myoview at that time was nonischemic with an EF of 68% and was overall a low risk study. Echo in 11/2018 showed an EF of 55-60%, moderate LVH, DD, trivial pericardial effusion, mild calcification of the aortic valve. In the setting of bradycardia her scheduled Toprol and prn propranolol (though was taking both on a daily basis) have previously been held.   She presented to [facility] on 1/24 with complaints of an "electrical shock" in her chest while standing in a doorway talking with her neighbors that radiated to her right flank and right side of the neck, lasting 1-2 minutes with spontaneously resolution. She denied any dyspnea, palpitations, nausea,  vomiting, dizziness, presyncope, or syncope. Given her symptoms, EMS was contacted. She took 324 mg of ASA in the field.   Upon the patient's arrival to [facility] they were found to have BP in the 123456 systolic improving to the [id number] to 0000000 systolic, HR 0000000 to 123XX123, temp afebrile, oxygen saturation 97% on room air, weight 81.6 kg. EKGs as below, CXR showed no active disease. Labs showed high sensitivity troponin of 7 with a delta of 7 trending to 8, COVID-19 negative, CBC unremarkable x 2, potassium 4.3-->4.2, BUN/SCr 16/1.01-->15/0.88, albumin 3.5, AST/ALT normal, TSH normal, magnesium 2.0, d-dimer 443.61. She was noted to have frequent PACs/PVCs in the ED in the setting of being off her beta blocker. Echo and cardiology consult was ordered at time of admission. Currently, chest pain free.   Past Medical History:  Diagnosis Date  . Allergy   . Anxiety   . Arthritis   . Clotting disorder ([place])   . Colitis   . Depression   . Diverticulitis 2013  . Gastric ulcer   . GERD (gastroesophageal reflux disease)   . Hypercholesterolemia   . Hypertension   . Infiltrating lobular carcinoma of left breast 2011   T2,N0, ER: 90%; PR 0%; Her 2 neu not amplified. [facility]).  . Melanoma ([place]) 1997  . Melanoma in situ of upper extremity ([place]) 03/19/2011  . Persistent atrial fibrillation ([place])    a. CHADS2VASc => 4 (HTN, age x 2, female)  . Personal history of radiation therapy 2011   BREAST CA  . Seroma    HISTORY OF LFT BREAST  . Sleep apnea   .  Thyroid cancer ([place]) 1992    Past Surgical History:  Procedure Laterality Date  . ABDOMINAL HYSTERECTOMY  1973   partial  . BREAST BIOPSY Left 02-13-13   BENIGN BREAST TISSUE WITH CHANGES CONSISTENT WITH FAT NECROSIS  . BREAST BIOPSY Left 01/21/2015   bx done in [place] office 11:00 left 6-8cmfn  . BREAST EXCISIONAL BIOPSY Left 1995   neg  . BREAST EXCISIONAL BIOPSY Left 2011   Breast cancer radiation  . BREAST LUMPECTOMY Left 2011   BREAST CA   . CARDIAC CATHETERIZATION    . CHOLECYSTECTOMY    . COLONOSCOPY  2013  . MELANOMA EXCISION     RT UPPER ARM  . PARTIAL HYSTERECTOMY     bleeding, ovaries in place.    . THYROID SURGERY  1992   FOR THYROID CANCER  . TONSILLECTOMY       Home Meds: Prior to Admission medications   Medication Sig Start Date End Date Taking? Authorizing Provider  albuterol (PROAIR HFA) 108 (90 Base) MCG/ACT inhaler Inhale 2 puffs into the lungs every 6 (six) hours as needed for wheezing or shortness of breath. 08/10/18  Yes Einar Pheasant, MD  Cholecalciferol (VITAMIN D) 1000 UNITS capsule Take 1,000 Units by mouth daily.     Yes [provider]  DULoxetine (CYMBALTA) 60 MG capsule TAKE 1 CAPSULE (60 MG TOTAL) BY MOUTH AT BEDTIME. 09/11/19  Yes Scott, Randell Patient, MD  ELIQUIS 5 MG TABS tablet TAKE 1 TABLET BY MOUTH TWICE A DAY 03/11/19  Yes Gollan, Kathlene November, MD  furosemide (LASIX) 20 MG tablet TAKE 1 TABLET (20 MG TOTAL) BY MOUTH AS DIRECTED. TAKE 1 TABLET (20 MG) TWICE A WEEK WITH POTASSIUM 09/13/19 12/12/19 Yes Gollan, Kathlene November, MD  levothyroxine (SYNTHROID) 88 MCG tablet TAKE ONE TABLET BY MOUTH ONCE DAILY BEFORE BREAKFAST Patient taking differently: Take 88 mcg by mouth daily before breakfast. TAKE ONE TABLET BY MOUTH ONCE DAILY BEFORE BREAKFAST 09/11/19  Yes Einar Pheasant, MD  lisinopril (ZESTRIL) 20 MG tablet TAKE 1 TABLET BY MOUTH EVERY DAY 03/22/19  Yes Einar Pheasant, MD  lovastatin (MEVACOR) 40 MG tablet TAKE 1 TABLET BY MOUTH EVERY DAY Patient taking differently: Take 40 mg by mouth at bedtime.  06/13/19  Yes Crecencio Mc, MD  Multiple Vitamins-Minerals (PRESERVISION AREDS 2) CAPS Take 1 tablet by mouth 2 (two) times daily.    Yes [provider]  omeprazole (PRILOSEC) 20 MG capsule TAKE 1 CAPSULE (20 MG TOTAL) BY MOUTH DAILY. 11/23/18  Yes Einar Pheasant, MD  potassium chloride (K-DUR) 10 MEQ tablet Take 2 tablets (20 mEq total) by mouth as directed. Take 2 tablets (20 mEq)  twice a week with Lasix 10/03/18  Yes Gollan, Kathlene November, MD  triamcinolone cream (KENALOG) 0.1 % Apply 1 application topically 2 (two) times daily. Patient taking differently: Apply 1 application topically as needed (rash).  08/10/18  Yes Einar Pheasant, MD  gabapentin (NEURONTIN) 300 MG capsule TAKE 2 CAPSULES (600 MG TOTAL) BY MOUTH AT BEDTIME. 09/17/19   Einar Pheasant, MD    Inpatient Medications: Scheduled Meds: . albuterol  2.5 mg Nebulization Once  . apixaban  5 mg Oral BID  . aspirin EC  81 mg Oral Daily  . DULoxetine  60 mg Oral QHS  . gabapentin  600 mg Oral QHS  . levothyroxine  88 mcg Oral Q0600  . lisinopril  20 mg Oral Daily  . pantoprazole  40 mg Oral Daily  . pravastatin  40 mg Oral q1800  .  propranolol  10 mg Oral QPM   Continuous Infusions: . sodium chloride 50 mL/hr at 10/21/19 0612   PRN Meds: acetaminophen **OR** acetaminophen, albuterol, HYDROcodone-acetaminophen, ondansetron **OR** ondansetron (ZOFRAN) IV  Allergies:   Allergies  Allergen Reactions  . Atorvastatin     Other reaction(s): Other (See Comments) STIFFNESS AND SORE STIFFNESS AND SORE  . Lipitor [Atorvastatin Calcium] Other (See Comments)    Stiffness & soreness  . Penicillins Rash    REACTION: Unknown reaction    Social History:   Social History   Socioeconomic History  . Marital status: Widowed    Spouse name: Not on file  . Number of children: Not on file  . Years of education: Not on file  . Highest education level: Not on file  Occupational History  . Not on file  Tobacco Use  . Smoking status: Never Smoker  . Smokeless tobacco: Never Used  Substance and Sexual Activity  . Alcohol use: Yes    Alcohol/week: 0.0 standard drinks    Comment: social drinking. average times a week  . Drug use: No  . Sexual activity: Never  Other Topics Concern  . Not on file  Social History Narrative   Independent and baseline. Lives by herself   Social Determinants of Health   Financial  Resource Strain:   . Difficulty of Paying Living Expenses: Not on file  Food Insecurity:   . Worried About Charity fundraiser in the Last Year: Not on file  . Ran Out of Food in the Last Year: Not on file  Transportation Needs:   . Lack of Transportation (Medical): Not on file  . Lack of Transportation (Non-Medical): Not on file  Physical Activity:   . Days of Exercise per Week: Not on file  . Minutes of Exercise per Session: Not on file  Stress:   . Feeling of Stress : Not on file  Social Connections:   . Frequency of Communication with Friends and Family: Not on file  . Frequency of Social Gatherings with Friends and Family: Not on file  . Attends Religious Services: Not on file  . Active Member of Clubs or Organizations: Not on file  . Attends Archivist Meetings: Not on file  . Marital Status: Not on file  Intimate Partner Violence:   . Fear of Current or Ex-Partner: Not on file  . Emotionally Abused: Not on file  . Physically Abused: Not on file  . Sexually Abused: Not on file     Family History:   Family History  Problem Relation Age of Onset  . Heart disease Mother   . Cancer Brother        lung   . Cancer Sister        breast  . Breast cancer Neg Hx     ROS:  Review of Systems  Constitutional: Positive for malaise/fatigue. Negative for chills, diaphoresis, fever and weight loss.  HENT: Negative for congestion.   Eyes: Negative for discharge and redness.  Respiratory: Negative for cough, hemoptysis, sputum production, shortness of breath and wheezing.   Cardiovascular: Positive for chest pain. Negative for palpitations, orthopnea, claudication, leg swelling and PND.  Gastrointestinal: Negative for abdominal pain, blood in stool, heartburn, melena, nausea and vomiting.  Genitourinary: Negative for hematuria.  Musculoskeletal: Negative for falls and myalgias.  Skin: Negative for rash.  Neurological: Positive for weakness. Negative for dizziness,  tingling, tremors, sensory change, speech change, focal weakness and loss of consciousness.  Endo/Heme/Allergies: Does not  bruise/bleed easily.  Psychiatric/Behavioral: Negative for substance abuse. The patient is not nervous/anxious.   All other systems reviewed and are negative.     Physical Exam/Data:   Vitals:   10/21/19 0700 10/21/19 0715 10/21/19 0730 10/21/19 0731  BP:   (!) 146/92 (!) 146/92  Pulse:   (!) 35 76  Resp: 18 16 18 16   Temp:    97.9 F (36.6 C)  TempSrc:    Oral  SpO2:   95% 97%  Weight:      Height:       No intake or output data in the 24 hours ending 10/21/19 0822 Filed Weights   10/20/19 1923  Weight: 81.6 kg   Body mass index is 29.95 kg/m.   Physical Exam: General: Well developed, well nourished, in no acute distress. Head: Normocephalic, atraumatic, sclera non-icteric, no xanthomas, nares without discharge.  Neck: Negative for carotid bruits. JVD not elevated. Lungs: Clear bilaterally to auscultation without wheezes, rales, or rhonchi. Breathing is unlabored. Heart: RRR with occasional extra systoles with S1 S2. No murmurs, rubs, or gallops appreciated. Abdomen: Soft, non-tender, non-distended with normoactive bowel sounds. No hepatomegaly. No rebound/guarding. No obvious abdominal masses. Msk:  Strength and tone appear normal for age. Extremities: No clubbing or cyanosis. No edema. Distal pedal pulses are 2+ and equal bilaterally. Neuro: Alert and oriented X 3. No facial asymmetry. No focal deficit. Moves all extremities spontaneously. Psych:  Responds to questions appropriately with a normal affect.   EKG:  The EKG was personally reviewed and demonstrates: 1/24, 19:15 - NSR with PACs and PVCs, 80 bpm, left axis deviation, poor R wave progression along the precordial leads. Repeat EKG 1/24, 19:17 - NSR, 81 bpm, PACs/PVCs, left axis deviation, poor R wave progression, nonspecific st/t changes. EKG 1/24, 19:26 - NSR, 91 bpm, left axis deviation,  PACS/PVCs, nonspecific st/t changes Telemetry:  Telemetry was personally reviewed and demonstrates: SR with PACs and PVCs  Weights: [facility] Weights   10/20/19 1923  Weight: 81.6 kg    Relevant CV Studies:  2D Echo 11/2018: 1. The left ventricle has normal systolic function, with an ejection fraction of 55-60%. The cavity size was normal. There is moderate concentric left ventricular hypertrophy. Left ventricular diastolic Doppler parameters are consistent with impaired relaxation.  2. The right ventricle has normal systolic function. The cavity was normal. There is no increase in right ventricular wall thickness.  3. Trivial pericardial effusion is present.  4. The mitral valve is grossly normal.  5. The tricuspid valve is grossly normal.  6. The aortic valve is grossly normal Mild calcification of the aortic valve. Aortic valve regurgitation was not assessed by color flow Doppler. __________  Leane Call 10/2018: Pharmacological myocardial perfusion imaging study with no significant  ischemia  Small region of fixed perfusion defect in the apical region, possibly secondary to attenuation over correction Normal wall motion, EF estimated at 68% No EKG changes concerning for ischemia at peak stress or in recovery. Low risk scan   Laboratory Data:  Chemistry Recent Labs  Lab 10/20/19 1940 10/21/19 0703  NA 139 143  K 4.3 4.2  CL 103 106  CO2 26 30  GLUCOSE 104* 114*  BUN 16 15  CREATININE 1.01* 0.88  CALCIUM 9.8 9.5  GFRNONAA 50* 59*  GFRAA 58* >60  ANIONGAP 10 7    Recent Labs  Lab 10/20/19 1940 10/21/19 0703  PROT 6.5 6.3*  ALBUMIN 3.5 3.5  AST 18 15  ALT 13 11  ALKPHOS  110 98  BILITOT 0.7 0.7   Hematology Recent Labs  Lab 10/20/19 1940 10/21/19 0703  WBC 7.5 5.9  RBC 4.38 4.11  HGB 13.1 12.4  HCT 41.4 38.8  MCV 94.5 94.4  MCH 29.9 30.2  MCHC 31.6 32.0  RDW 11.9 12.0  PLT 228 199   Cardiac EnzymesNo results for input(s): TROPONINI in the last  168 hours. No results for input(s): TROPIPOC in the last 168 hours.  BNPNo results for input(s): BNP, PROBNP in the last 168 hours.  DDimer No results for input(s): DDIMER in the last 168 hours.  Radiology/Studies:  [facility] Chest Port 1 View  Result Date: 10/20/2019 IMPRESSION: No active disease. Electronically Signed   By: Rolm Baptise M.D.   On: 10/20/2019 20:04    Assessment and Plan:   1. Atypical chest pain: -Currently, without chest pain -HS-Tn negative x 3 -EKG without ischemic st/t changes -Echo pending -Recent Myoview low risk -Discontinue ASA to minimize bleeding risk, no indication for dual therapy at this time -No plans for inpatient ischemic evaluation at this time, pending echo  2. Persistent Afib/PACs/PVCs: -Maintaining sinus rhythm with frequent PACs/PVCs -Previously on scheduled Toprol with prn propranolol, though she had been taking both medications scheduled leading to bradycardia and subsequent discontinuation  -CHADS2VASc at least 5 (HTN, age x 2, vascular disease, sex category) -Remains on Eliquis 5 mg bid, she does not meet reduced dosing criteria at this time -Likely exacerbated by being off beta blocker -Resume propranolol now -Potassium and magnesium at goal -TSH normal -Echo pending  3. History of bradycardia: -Previously noted in the setting of taking scheduled Toprol Xl and propranolol, the latter of which was prescribed to be taken prn -Monitor heart rates with the resumption of propranolol, will move up her dose to be given now rather than tonight so we can assess her rates  4. HTN: -Blood pressure has been in the 0000000 systolic -Lisinopril and propranolol   5. HLD: -LDL of 103 this admission -Remains on PTA statin  6. Tremor: -Resume propranolol    For questions or updates, please contact [place] Please consult [email or web address] for contact info under Cardiology/STEMI.   Signed, Christell Faith, PA-C [address] Pager: [phone]  10/21/2019, 8:22 AM

## 2019-10-21 NOTE — Progress Notes (Signed)
*  PRELIMINARY RESULTS* Echocardiogram 2D Echocardiogram has been performed.  Sherrie Sport 10/21/2019, 3:06 PM

## 2019-10-21 NOTE — Telephone Encounter (Signed)
-----   Message from Wellington Hampshire, MD sent at 10/21/2019  3:37 PM EST ----- Hospitalized briefly for atypical chest pain and negative troponin. TCM follow-up needed with Dr. Candis Musa or APP in 1 to 2 weeks.

## 2019-10-21 NOTE — ED Notes (Signed)
Pt ambulatory to the toilet with a steady gait.

## 2019-10-21 NOTE — ED Notes (Signed)
Pt up to BR with steady gait and standby assist.  

## 2019-10-22 ENCOUNTER — Telehealth: Payer: Self-pay

## 2019-10-22 ENCOUNTER — Ambulatory Visit (INDEPENDENT_AMBULATORY_CARE_PROVIDER_SITE_OTHER): Payer: Medicare Other | Admitting: Vascular Surgery

## 2019-10-22 NOTE — Telephone Encounter (Signed)
Failed attempt to reach patient for transition of care. No answer. Left message requesting patient to call back and schedule hospital follow up with primary care provider. Will follow.

## 2019-10-23 NOTE — Telephone Encounter (Signed)
Attempted to schedule.  

## 2019-10-23 NOTE — Telephone Encounter (Signed)
Failed second attempt to reach patient for hospital follow up transition of care. Appointment previously scheduled for 10/28/19 @ 10:00 with primary care provider.  Will follow.

## 2019-10-23 NOTE — Telephone Encounter (Signed)
Late Entry 10/22/19 0900 Attempted to schedule. lmov .

## 2019-10-23 NOTE — Telephone Encounter (Signed)
-----   Message from Valora Corporal, RN sent at 10/22/2019 11:07 AM EST ----- Can we get this patient set up to see TG or APP?  Thanks ----- Message ----- From: Wellington Hampshire, MD Sent: 10/21/2019   3:37 PM EST To: Valora Corporal, RN, [facility] Scheduling  Hospitalized briefly for atypical chest pain and negative troponin. TCM follow-up needed with Dr. Candis Musa or APP in 1 to 2 weeks.

## 2019-10-24 NOTE — Telephone Encounter (Signed)
Failed third attempt to reach patient for transition of care.

## 2019-10-25 ENCOUNTER — Ambulatory Visit: Payer: Medicare Other | Admitting: Physician Assistant

## 2019-10-25 NOTE — Progress Notes (Deleted)
Office Visit    Patient Name: Kerri Mills Date of Encounter: 10/25/2019  Primary Care Provider:  Einar Pheasant, MD Primary Cardiologist:  No primary care provider on file.  Chief Complaint    86 year old female with history of persistent atrial fibrillation on Eliquis, remote DVT in 1992, LBBB, breast cancer s/p lumpectomy and radiation therapy, pretension, hyperlipidemia, tremor, sleep apnea, lower extremity edema, diverticulitis, thyroid cancer, melanoma, depression, gastric ulcer, and GERD, and is being seen today for hospital follow-up.  Past Medical History    Past Medical History:  Diagnosis Date  . Allergy   . Anxiety   . Arthritis   . Clotting disorder ([place])   . Colitis   . Depression   . Diverticulitis 2013  . Gastric ulcer   . GERD (gastroesophageal reflux disease)   . Hypercholesterolemia   . Hypertension   . Infiltrating lobular carcinoma of left breast 2011   T2,N0, ER: 90%; PR 0%; Her 2 neu not amplified. [facility]).  . Melanoma ([place]) 1997  . Melanoma in situ of upper extremity ([place]) 03/19/2011  . Persistent atrial fibrillation ([place])    a. CHADS2VASc => 4 (HTN, age x 2, female)  . Personal history of radiation therapy 2011   BREAST CA  . Seroma    HISTORY OF LFT BREAST  . Sleep apnea   . Thyroid cancer ([place]) 1992   Past Surgical History:  Procedure Laterality Date  . ABDOMINAL HYSTERECTOMY  1973   partial  . BREAST BIOPSY Left 02-13-13   BENIGN BREAST TISSUE WITH CHANGES CONSISTENT WITH FAT NECROSIS  . BREAST BIOPSY Left 01/21/2015   bx done in [facility] 11:00 left 6-8cmfn  . BREAST EXCISIONAL BIOPSY Left 1995   neg  . BREAST EXCISIONAL BIOPSY Left 2011   Breast cancer radiation  . BREAST LUMPECTOMY Left 2011   BREAST CA  . CARDIAC CATHETERIZATION    . CHOLECYSTECTOMY    . COLONOSCOPY  2013  . MELANOMA EXCISION     RT UPPER ARM  . PARTIAL HYSTERECTOMY     bleeding, ovaries in place.    . THYROID SURGERY  1992   FOR  THYROID CANCER  . TONSILLECTOMY      Allergies  Allergies  Allergen Reactions  . Atorvastatin     Other reaction(s): Other (See Comments) STIFFNESS AND SORE STIFFNESS AND SORE  . Lipitor [Atorvastatin Calcium] Other (See Comments)    Stiffness & soreness  . Penicillins Rash    REACTION: Unknown reaction    History of Present Illness    86 year old female with PMH as above.  She was admitted to the hospital 09/2017 for CP and A. fib with RVR.  Echo showed EF 55 to 60% and normal wall motion.  She developed bradycardia with both metoprolol and diltiazem.  She was seen 10/2018 with crushing chest pain described 2 weeks prior and duration 2 to 5 minutes.  Lexiscan Myoview was completed and ruled low risk/nonischemic with EF 68% and overall a low risk study.  Echo 11/2018 showed EF 55 to 60%, trivial pericardial effusion, and mild calcification of the aortic valve.  In the setting of bradycardia, her scheduled Toprol and as needed propanolol (though taking both on a daily basis) were held previously.  She presented to [facility] with complaints of electrical shock in her chest while standing in the doorway and talking to her neighbors.  This CP radiated to her right flank and right side of her neck, lasting 1  to 2 minutes with spontaneous resolution.  She denied any associated symptoms.  She took 324 mg of ASA in the field.  On arrival to [facility], she was found to have SBP 160 and improving to the 412I to 786 systolic.  HR 60s to 80s.  Labs showed high-sensitivity troponin of 7 trending to 8.  She was noted to have frequent PACs and PVCs in the ED in the setting of being off her beta-blocker.  EKG showed NSR with PACs and PVCs, poor R wave progression along the precordial leads, and no acute changes.  Recommendations were to discontinue ASA to minimize bleeding risk.  She was resumed on propranolol as needed and Toprol with recommendation to monitor her heart rate with resumption.  Echo was ordered and  showed EF 55 to 60%.  No RWMA.  Home Medications    Prior to Admission medications   Medication Sig Start Date End Date Taking? Authorizing Provider  albuterol (PROAIR HFA) 108 (90 Base) MCG/ACT inhaler Inhale 2 puffs into the lungs every 6 (six) hours as needed for wheezing or shortness of breath. 08/10/18   Einar Pheasant, MD  Cholecalciferol (VITAMIN D) 1000 UNITS capsule Take 1,000 Units by mouth daily.      [provider]  DULoxetine (CYMBALTA) 60 MG capsule TAKE 1 CAPSULE (60 MG TOTAL) BY MOUTH AT BEDTIME. 09/11/19   Einar Pheasant, MD  ELIQUIS 5 MG TABS tablet TAKE 1 TABLET BY MOUTH TWICE A DAY 03/11/19   Gollan, Kathlene November, MD  furosemide (LASIX) 20 MG tablet TAKE 1 TABLET (20 MG TOTAL) BY MOUTH AS DIRECTED. TAKE 1 TABLET (20 MG) TWICE A WEEK WITH POTASSIUM 09/13/19 12/12/19  Minna Merritts, MD  gabapentin (NEURONTIN) 300 MG capsule TAKE 2 CAPSULES (600 MG TOTAL) BY MOUTH AT BEDTIME. 09/17/19   Einar Pheasant, MD  levothyroxine (SYNTHROID) 88 MCG tablet Take 1 tablet (88 mcg total) by mouth daily before breakfast. TAKE ONE TABLET BY MOUTH ONCE DAILY BEFORE BREAKFAST 10/21/19   Enzo Bi, MD  lisinopril (ZESTRIL) 20 MG tablet TAKE 1 TABLET BY MOUTH EVERY DAY 03/22/19   Einar Pheasant, MD  lovastatin (MEVACOR) 40 MG tablet Take 1 tablet (40 mg total) by mouth at bedtime. 10/21/19   Enzo Bi, MD  metoprolol succinate (TOPROL XL) 25 MG 24 hr tablet Take 0.5 tablets (12.5 mg total) by mouth daily. This is the last dose of Toprol you were on. 10/21/19 11/20/19  Enzo Bi, MD  Multiple Vitamins-Minerals (PRESERVISION AREDS 2) CAPS Take 1 tablet by mouth 2 (two) times daily.     [provider]  omeprazole (PRILOSEC) 20 MG capsule TAKE 1 CAPSULE (20 MG TOTAL) BY MOUTH DAILY. 11/23/18   Einar Pheasant, MD  potassium chloride (K-DUR) 10 MEQ tablet Take 2 tablets (20 mEq total) by mouth as directed. Take 2 tablets (20 mEq) twice a week with Lasix 10/03/18   Minna Merritts, MD    triamcinolone cream (KENALOG) 0.1 % Apply 1 application topically as needed (rash). 10/21/19   Enzo Bi, MD    Review of Systems    ***.  All other systems reviewed and are otherwise negative except as noted above.  Physical Exam    VS:  There were no vitals taken for this visit. , BMI There is no height or weight on file to calculate BMI. GEN: Well nourished, well developed, in no acute distress. HEENT: normal. Neck: Supple, no JVD, carotid bruits, or masses. Cardiac: RRR, no murmurs, rubs, or gallops. No  clubbing, cyanosis, edema.  Radials/DP/PT 2+ and equal bilaterally.  Respiratory:  Respirations regular and unlabored, clear to auscultation bilaterally. GI: Soft, nontender, nondistended, BS + x 4. MS: no deformity or atrophy. Skin: warm and dry, no rash. Neuro:  Strength and sensation are intact. Psych: Normal affect.  Accessory Clinical Findings    ECG personally reviewed by me today - *** - no acute changes.  Echo 10/21/19  1. Left ventricular ejection fraction, by visual estimation, is 55 to 60%. The left ventricle has normal function. There is moderately increased left ventricular hypertrophy.  2. Left ventricular diastolic parameters are indeterminate.  3. The left ventricle has no regional wall motion abnormalities.  4. Global right ventricle has normal systolic function.The right ventricular size is normal. No increase in right ventricular wall thickness.  5. Left atrial size was mildly dilated.  6. Right atrial size was normal.  7. The mitral valve is normal in structure. Trivial mitral valve regurgitation. No evidence of mitral stenosis.  8. The tricuspid valve is normal in structure. Tricuspid valve regurgitation is trivial.  9. The aortic valve is normal in structure. Aortic valve regurgitation is not visualized. Mild to moderate aortic valve sclerosis/calcification without any evidence of aortic stenosis. 10. The pulmonic valve was normal in structure. Pulmonic  valve regurgitation is not visualized. 11. TR signal is inadequate for assessing pulmonary artery systolic pressure. 12. The inferior vena cava is normal in size with greater than 50% respiratory variability, suggesting right atrial pressure of 3 mmHg.  NM Study 11/17/18 Pharmacological myocardial perfusion imaging study with no significant  ischemia  Small region of fixed perfusion defect in the apical region, possibly secondary to attenuation over correction Normal wall motion, EF estimated at 68% No EKG changes concerning for ischemia at peak stress or in recovery. Low risk scan Signed, Esmond Plants, MD, Ph.D [facility]  Most recent labs 10/21/2019 show sodium 143, potassium 4.2, creatinine 0.88, BUN 15, phosphorus 3.9, magnesium 2.0, albumin 3.5, AST 15, ALT 11, alk phos 98, total cholesterol 175, HDL 33, LDL 103, triglycerides 195, WBC 5.9, hemoglobin 12.4, hematocrit 38.8, TSH 2.668   There were no vitals filed for this visit.   Assessment & Plan    1.  ***   Arvil Chaco, PA-C 10/25/2019, 6:59 AM

## 2019-10-28 ENCOUNTER — Other Ambulatory Visit: Payer: Self-pay

## 2019-10-28 ENCOUNTER — Telehealth: Payer: Self-pay | Admitting: Internal Medicine

## 2019-10-28 ENCOUNTER — Ambulatory Visit (INDEPENDENT_AMBULATORY_CARE_PROVIDER_SITE_OTHER): Payer: Medicare Other | Admitting: Internal Medicine

## 2019-10-28 ENCOUNTER — Encounter: Payer: Self-pay | Admitting: Internal Medicine

## 2019-10-28 DIAGNOSIS — K219 Gastro-esophageal reflux disease without esophagitis: Secondary | ICD-10-CM

## 2019-10-28 DIAGNOSIS — E7800 Pure hypercholesterolemia, unspecified: Secondary | ICD-10-CM | POA: Diagnosis not present

## 2019-10-28 DIAGNOSIS — I10 Essential (primary) hypertension: Secondary | ICD-10-CM

## 2019-10-28 DIAGNOSIS — M25552 Pain in left hip: Secondary | ICD-10-CM

## 2019-10-28 DIAGNOSIS — R079 Chest pain, unspecified: Secondary | ICD-10-CM

## 2019-10-28 DIAGNOSIS — J411 Mucopurulent chronic bronchitis: Secondary | ICD-10-CM

## 2019-10-28 DIAGNOSIS — G4733 Obstructive sleep apnea (adult) (pediatric): Secondary | ICD-10-CM

## 2019-10-28 DIAGNOSIS — I4891 Unspecified atrial fibrillation: Secondary | ICD-10-CM

## 2019-10-28 DIAGNOSIS — R739 Hyperglycemia, unspecified: Secondary | ICD-10-CM | POA: Diagnosis not present

## 2019-10-28 NOTE — Progress Notes (Addendum)
Patient ID: Kerri Mills, female   DOB: Nov 01, 1930, 86 y.o.   MRN: [id number]   Virtual Visit via telephone Note  This visit type was conducted due to national recommendations for restrictions regarding the COVID-19 pandemic (e.g. social distancing).  This format is felt to be most appropriate for this patient at this time.  All issues noted in this document were discussed and addressed.  No physical exam was performed (except for noted visual exam findings with Video Visits).   I connected with Kerri Mills by telephone and verified that I am speaking with the correct person using two identifiers. Location patient: home Location provider: work  Persons participating in the telephone visit: patient, provider  The limitations, risks, security and privacy concerns of performing an evaluation and management service by telephone and the availability of in person appointments have been discussed.  The patient expressed understanding and agreed to proceed.   Reason for visit: hospital follow up.   HPI: She was seen in ER and hospitalized overnight for chest pain.  Was seen in ER 10/20/19 and kept overnight.  EKG - frequent PVCs.  Ruled out.  Discharged for f/u with cardiology. Started back on metoprolol 1/2 tablet.  Off propranolol.  Due to see Dr Rockey Situ this week.  No further chest pain.  Breathing stable.  No increased heart rate or palpitations.  No cough or congestion.  No nausea or vomiting.  No abdominal pain.  Persistent hip pain.  Xray - some arthritis - lumbar spine and pubic symphysis regions.  Bothers most when walking.  Has tried therapy and has been doing exercise.  Persistent pain.  Agreeable to referral to ortho.    ROS: See pertinent positives and negatives per HPI.  Past Medical History:  Diagnosis Date  . Allergy   . Anxiety   . Arthritis   . Atypical chest pain    a. 10/2018 MV: small, fixed apical defect possibly 2/2 attenuation artifact. No ischemia.  EF 68%.  . Clotting  disorder ([place])   . Colitis   . Depression   . Diverticulitis 2013  . Gastric ulcer   . GERD (gastroesophageal reflux disease)   . History of echocardiogram    a. 09/2019 Echo: EF 55-60%, mod LVH. Mildly dil LA. Triv MR/TR.  Mar[place] Kitchen Hypercholesterolemia   . Hypertension   . Infiltrating lobular carcinoma of left breast 2011   T2,N0, ER: 90%; PR 0%; Her 2 neu not amplified. [facility]).  . Melanoma ([place]) 1997  . Melanoma in situ of upper extremity ([place]) 03/19/2011  . Persistent atrial fibrillation ([place])    a. CHADS2VASc => 4 (HTN, age x 2, female)  . Personal history of radiation therapy 2011   BREAST CA  . Seroma    HISTORY OF LFT BREAST  . Sleep apnea   . Thyroid cancer ([place]) 1992    Past Surgical History:  Procedure Laterality Date  . ABDOMINAL HYSTERECTOMY  1973   partial  . BREAST BIOPSY Left 02-13-13   BENIGN BREAST TISSUE WITH CHANGES CONSISTENT WITH FAT NECROSIS  . BREAST BIOPSY Left 01/21/2015   bx done in [facility] 11:00 left 6-8cmfn  . BREAST EXCISIONAL BIOPSY Left 1995   neg  . BREAST EXCISIONAL BIOPSY Left 2011   Breast cancer radiation  . BREAST LUMPECTOMY Left 2011   BREAST CA  . CARDIAC CATHETERIZATION    . CHOLECYSTECTOMY    . COLONOSCOPY  2013  . MELANOMA EXCISION     RT UPPER ARM  .  PARTIAL HYSTERECTOMY     bleeding, ovaries in place.    . THYROID SURGERY  1992   FOR THYROID CANCER  . TONSILLECTOMY      Family History  Problem Relation Age of Onset  . Heart disease Mother   . Cancer Brother        lung   . Cancer Sister        breast  . Breast cancer Neg Hx     SOCIAL HX: reviewed.    Current Outpatient Medications:  .  albuterol (PROAIR HFA) 108 (90 Base) MCG/ACT inhaler, Inhale 2 puffs into the lungs every 6 (six) hours as needed for wheezing or shortness of breath., Disp: 1 Inhaler, Rfl: 3 .  Cholecalciferol (VITAMIN D) 1000 UNITS capsule, Take 1,000 Units by mouth daily.  , Disp: , Rfl:  .  DULoxetine (CYMBALTA) 60 MG  capsule, TAKE 1 CAPSULE (60 MG TOTAL) BY MOUTH AT BEDTIME., Disp: 90 capsule, Rfl: 1 .  furosemide (LASIX) 20 MG tablet, TAKE 1 TABLET (20 MG TOTAL) BY MOUTH AS DIRECTED. TAKE 1 TABLET (20 MG) TWICE A WEEK WITH POTASSIUM, Disp: 24 tablet, Rfl: 3 .  gabapentin (NEURONTIN) 300 MG capsule, TAKE 2 CAPSULES (600 MG TOTAL) BY MOUTH AT BEDTIME., Disp: 180 capsule, Rfl: 1 .  levothyroxine (SYNTHROID) 88 MCG tablet, Take 1 tablet (88 mcg total) by mouth daily before breakfast. TAKE ONE TABLET BY MOUTH ONCE DAILY BEFORE BREAKFAST, Disp: , Rfl:  .  lisinopril (ZESTRIL) 20 MG tablet, TAKE 1 TABLET BY MOUTH EVERY DAY, Disp: 30 tablet, Rfl: 5 .  lovastatin (MEVACOR) 40 MG tablet, Take 1 tablet (40 mg total) by mouth at bedtime., Disp: , Rfl:  .  metoprolol succinate (TOPROL XL) 25 MG 24 hr tablet, Take 0.5 tablets (12.5 mg total) by mouth daily. This is the last dose of Toprol you were on., Disp: 15 tablet, Rfl: 0 .  Multiple Vitamins-Minerals (PRESERVISION AREDS 2) CAPS, Take 1 tablet by mouth 2 (two) times daily. , Disp: , Rfl:  .  omeprazole (PRILOSEC) 20 MG capsule, TAKE 1 CAPSULE (20 MG TOTAL) BY MOUTH DAILY., Disp: 90 capsule, Rfl: 3 .  potassium chloride (K-DUR) 10 MEQ tablet, Take 2 tablets (20 mEq total) by mouth as directed. Take 2 tablets (20 mEq) twice a week with Lasix, Disp: 48 tablet, Rfl: 3 .  triamcinolone cream (KENALOG) 0.1 %, Apply 1 application topically as needed (rash)., Disp: , Rfl:  .  ELIQUIS 5 MG TABS tablet, TAKE 1 TABLET BY MOUTH TWICE A DAY, Disp: 180 tablet, Rfl: 1  Current Facility-Administered Medications:  .  albuterol (PROVENTIL) (2.5 MG/3ML) 0.083% nebulizer solution 2.5 mg, 2.5 mg, Nebulization, Once, Guse, Jacquelynn Cree, FNP  EXAM:  GENERAL: alert. Sounds to be in no acute distress. Answering questions appropriately.   PSYCH/NEURO: pleasant and cooperative, no obvious depression or anxiety, speech and thought processing grossly intact  ASSESSMENT AND PLAN:  Discussed the  following assessment and plan:  Atrial fibrillation ([place]) Doing well on current regimen.  On low dose beta blocker now.  Doing well.  Follow.  Has f/u this week with cardiology.    Essential hypertension Blood pressure has been under good control.  Continue current medication regimen.  Follow pressures.  Follow metabolic panel.    GERD Controlled.   Hypercholesterolemia On lovastatin.  Low cholesterol diet and exercise.  Follow lipid panel and liver function tests.    Hyperglycemia Low carb diet and exercise.  Follow met b and a1c.  Left hip pain Persistent pain.  Has tried therapy and exercise.  Agreeable to ortho referral.    Obstructive sleep apnea Planning for sleep study.   Mucopurulent chronic bronchitis ([place]) Followed by pulmonary.  Breathing stable.  Follow.   Chest pain Recently admitted with chest pain.  Ruled out.  ECHO with normal EF.  No wall motion abnormality.  Stress test negative for ischemia 2019.  Discharged on toprol 1/2 tablet q day.  No chest pain since discharge.  Follow.    Orders Placed This Encounter  Procedures  . Ambulatory referral to Orthopedic Surgery    Referral Priority:   Routine    Referral Type:   Surgical    Referral Reason:   Specialty Services Required    Requested Specialty:   Orthopedic Surgery    Number of Visits Requested:   1     I discussed the assessment and treatment plan with the patient. The patient was provided an opportunity to ask questions and all were answered. The patient agreed with the plan and demonstrated an understanding of the instructions.   The patient was advised to call back or seek an in-person evaluation if the symptoms worsen or if the condition fails to improve as anticipated.  I provided 25 minutes of non-face-to-face time during this encounter.   Einar Pheasant, MD

## 2019-10-28 NOTE — Telephone Encounter (Signed)
LMTCB and schedule an 8wk follow up/

## 2019-10-29 ENCOUNTER — Other Ambulatory Visit: Payer: Self-pay | Admitting: Cardiovascular Disease

## 2019-10-29 ENCOUNTER — Encounter (INDEPENDENT_AMBULATORY_CARE_PROVIDER_SITE_OTHER): Payer: Self-pay | Admitting: Vascular Surgery

## 2019-10-29 ENCOUNTER — Ambulatory Visit (INDEPENDENT_AMBULATORY_CARE_PROVIDER_SITE_OTHER): Payer: Medicare Other | Admitting: Vascular Surgery

## 2019-10-29 VITALS — BP 142/88 | HR 73 | Resp 16 | Wt 177.0 lb

## 2019-10-29 DIAGNOSIS — I10 Essential (primary) hypertension: Secondary | ICD-10-CM | POA: Diagnosis not present

## 2019-10-29 DIAGNOSIS — I8312 Varicose veins of left lower extremity with inflammation: Secondary | ICD-10-CM | POA: Diagnosis not present

## 2019-10-29 DIAGNOSIS — I8311 Varicose veins of right lower extremity with inflammation: Secondary | ICD-10-CM | POA: Diagnosis not present

## 2019-10-29 DIAGNOSIS — I4891 Unspecified atrial fibrillation: Secondary | ICD-10-CM

## 2019-10-29 NOTE — Assessment & Plan Note (Signed)
Doing well status post venous interventions in the past.  No current issues.  Return to clinic as needed.

## 2019-10-29 NOTE — Progress Notes (Signed)
MRN : [id number]  Kerri Mills is a 86 y.o. (June 12, 1931) female who presents with chief complaint of  Chief Complaint  Patient presents with  . Follow-up    37month follow up  .  History of Present Illness: Patient returns today in follow up of her venous insufficiency.  She has undergone venous interventions to both lower extremities in the past.  She had a blood clot in her left leg almost 30 years ago and has had some chronic left leg swelling since that time.  This is stable and has not changed.  She has no new complaints today.  She wears her compression stockings daily.  Current Outpatient Medications  Medication Sig Dispense Refill  . albuterol (PROAIR HFA) 108 (90 Base) MCG/ACT inhaler Inhale 2 puffs into the lungs every 6 (six) hours as needed for wheezing or shortness of breath. 1 Inhaler 3  . Cholecalciferol (VITAMIN D) 1000 UNITS capsule Take 1,000 Units by mouth daily.      . DULoxetine (CYMBALTA) 60 MG capsule TAKE 1 CAPSULE (60 MG TOTAL) BY MOUTH AT BEDTIME. 90 capsule 1  . ELIQUIS 5 MG TABS tablet TAKE 1 TABLET BY MOUTH TWICE A DAY 180 tablet 1  . furosemide (LASIX) 20 MG tablet TAKE 1 TABLET (20 MG TOTAL) BY MOUTH AS DIRECTED. TAKE 1 TABLET (20 MG) TWICE A WEEK WITH POTASSIUM 24 tablet 3  . gabapentin (NEURONTIN) 300 MG capsule TAKE 2 CAPSULES (600 MG TOTAL) BY MOUTH AT BEDTIME. 180 capsule 1  . levothyroxine (SYNTHROID) 88 MCG tablet Take 1 tablet (88 mcg total) by mouth daily before breakfast. TAKE ONE TABLET BY MOUTH ONCE DAILY BEFORE BREAKFAST    . lisinopril (ZESTRIL) 20 MG tablet TAKE 1 TABLET BY MOUTH EVERY DAY 30 tablet 5  . lovastatin (MEVACOR) 40 MG tablet Take 1 tablet (40 mg total) by mouth at bedtime.    . metoprolol succinate (TOPROL XL) 25 MG 24 hr tablet Take 0.5 tablets (12.5 mg total) by mouth daily. This is the last dose of Toprol you were on. 15 tablet 0  . Multiple Vitamins-Minerals (PRESERVISION AREDS 2) CAPS Take 1 tablet by mouth 2 (two) times daily.      Marland Kitchen omeprazole (PRILOSEC) 20 MG capsule TAKE 1 CAPSULE (20 MG TOTAL) BY MOUTH DAILY. 90 capsule 3  . potassium chloride (K-DUR) 10 MEQ tablet Take 2 tablets (20 mEq total) by mouth as directed. Take 2 tablets (20 mEq) twice a week with Lasix 48 tablet 3  . triamcinolone cream (KENALOG) 0.1 % Apply 1 application topically as needed (rash).     Current Facility-Administered Medications  Medication Dose Route Frequency Provider Last Rate Last Admin  . albuterol (PROVENTIL) (2.5 MG/3ML) 0.083% nebulizer solution 2.5 mg  2.5 mg Nebulization Once Guse, Jacquelynn Cree, FNP        Past Medical History:  Diagnosis Date  . Allergy   . Anxiety   . Arthritis   . Clotting disorder ([place])   . Colitis   . Depression   . Diverticulitis 2013  . Gastric ulcer   . GERD (gastroesophageal reflux disease)   . Hypercholesterolemia   . Hypertension   . Infiltrating lobular carcinoma of left breast 2011   T2,N0, ER: 90%; PR 0%; Her 2 neu not amplified. [facility]).  . Melanoma ([place]) 1997  . Melanoma in situ of upper extremity ([place]) 03/19/2011  . Persistent atrial fibrillation ([place])    a. CHADS2VASc => 4 (HTN, age x 2, female)  .  Personal history of radiation therapy 2011   BREAST CA  . Seroma    HISTORY OF LFT BREAST  . Sleep apnea   . Thyroid cancer ([place]) 1992    Past Surgical History:  Procedure Laterality Date  . ABDOMINAL HYSTERECTOMY  1973   partial  . BREAST BIOPSY Left 02-13-13   BENIGN BREAST TISSUE WITH CHANGES CONSISTENT WITH FAT NECROSIS  . BREAST BIOPSY Left 01/21/2015   bx done in [facility] office 11:00 left 6-8cmfn  . BREAST EXCISIONAL BIOPSY Left 1995   neg  . BREAST EXCISIONAL BIOPSY Left 2011   Breast cancer radiation  . BREAST LUMPECTOMY Left 2011   BREAST CA  . CARDIAC CATHETERIZATION    . CHOLECYSTECTOMY    . COLONOSCOPY  2013  . MELANOMA EXCISION     RT UPPER ARM  . PARTIAL HYSTERECTOMY     bleeding, ovaries in place.    . THYROID SURGERY  1992   FOR THYROID  CANCER  . TONSILLECTOMY      Social History Social History   Tobacco Use  . Smoking status: Never Smoker  . Smokeless tobacco: Never Used  Substance Use Topics  . Alcohol use: Yes    Alcohol/week: 0.0 standard drinks    Comment: social drinking. average times a week  . Drug use: No    Family History  Problem Relation Age of Onset  . Heart disease Mother   . Cancer Brother        lung   . Cancer Sister        breast  . Breast cancer Neg Hx      Allergies  Allergen Reactions  . Atorvastatin     Other reaction(s): Other (See Comments) STIFFNESS AND SORE STIFFNESS AND SORE  . Lipitor [Atorvastatin Calcium] Other (See Comments)    Stiffness & soreness  . Penicillins Rash    REACTION: Unknown reaction    REVIEW OF SYSTEMS (Negative unless checked)  Constitutional: [] ?Weight loss  [] ?Fever  [] ?Chills Cardiac: [] ?Chest pain   [] ?Chest pressure   [] ?Palpitations   [] ?Shortness of breath when laying flat   [] ?Shortness of breath at rest   [] ?Shortness of breath with exertion. Vascular:  [] ?Pain in legs with walking   [] ?Pain in legs at rest   [] ?Pain in legs when laying flat   [] ?Claudication   [] ?Pain in feet when walking  [] ?Pain in feet at rest  [] ?Pain in feet when laying flat   [x] ?History of DVT   [] ?Phlebitis   [x] ?Swelling in legs   [x] ?Varicose veins   [] ?Non-healing ulcers Pulmonary:   [] ?Uses home oxygen   [] ?Productive cough   [] ?Hemoptysis   [] ?Wheeze  [] ?COPD   [] ?Asthma Neurologic:  [] ?Dizziness  [] ?Blackouts   [] ?Seizures   [] ?History of stroke   [] ?History of TIA  [] ?Aphasia   [] ?Temporary blindness   [] ?Dysphagia   [] ?Weakness or numbness in arms   [] ?Weakness or numbness in legs Musculoskeletal:  [x] ?Arthritis   [] ?Joint swelling   [x] ?Joint pain   [] ?Low back pain Hematologic:  [] ?Easy bruising  [] ?Easy bleeding   [] ?Hypercoagulable state   [] ?Anemic   Gastrointestinal:  [] ?Blood in stool   [] ?Vomiting blood  [x] ?Gastroesophageal reflux/heartburn    [] ?Abdominal pain Genitourinary:  [] ?Chronic kidney disease   [] ?Difficult urination  [] ?Frequent urination  [] ?Burning with urination   [] ?Hematuria Skin:  [] ?Rashes   [] ?Ulcers   [] ?Wounds Psychological:  [] ?History of anxiety   [] ? History of major depression.  Physical Examination  BP (!) 142/88 (BP  Location: Right Arm)   Pulse 73   Resp 16   Wt 177 lb (80.3 kg)   BMI 29.45 kg/m  Gen:  WD/WN, NAD.  Appears younger than stated age Head: [place]/AT, No temporalis wasting. Ear/Nose/Throat: Hearing grossly intact, nares w/o erythema or drainage Eyes: Conjunctiva clear. Sclera non-icteric Neck: Supple.  Trachea midline Pulmonary:  Good air movement, no use of accessory muscles.  Cardiac: Irregular Vascular:  Vessel Right Left  Radial Palpable Palpable                          PT Palpable  1+ palpable  DP Palpable Palpable   Gastrointestinal: soft, non-tender/non-distended. No guarding/reflex.  Musculoskeletal: M/S 5/5 throughout.  No deformity or atrophy.  Mild left lower extremity edema. Neurologic: Sensation grossly intact in extremities.  Symmetrical.  Speech is fluent.  Psychiatric: Judgment intact, Mood & affect appropriate for pt's clinical situation. Dermatologic: No rashes or ulcers noted.  No cellulitis or open wounds.       Labs Recent Results (from the past 2160 hour(s))  Lipid panel     Status: Abnormal   Collection Time: 08/08/19  9:03 AM  Result Value Ref Range   Cholesterol 161 0 - 200 mg/dL    Comment: ATP III Classification       Desirable:  < 200 mg/dL               Borderline High:  200 - 239 mg/dL          High:  > = 240 mg/dL   Triglycerides 176.0 (H) 0.0 - 149.0 mg/dL    Comment: Normal:  <150 mg/dLBorderline High:  150 - 199 mg/dL   HDL 38.70 (L) >39.00 mg/dL   VLDL 35.2 0.0 - 40.0 mg/dL   LDL Cholesterol 87 0 - 99 mg/dL   Total CHOL/HDL Ratio 4     Comment:                Men          Women1/2 Average Risk     3.4          3.3Average Risk           5.0          4.42X Average Risk          9.6          7.13X Average Risk          15.0          11.0                       NonHDL 121.99     Comment: NOTE:  Non-HDL goal should be 30 mg/dL higher than patient's LDL goal (i.e. LDL goal of < 70 mg/dL, would have non-HDL goal of < 100 mg/dL)  Hepatic function panel     Status: None   Collection Time: 08/08/19  9:03 AM  Result Value Ref Range   Total Bilirubin 0.4 0.2 - 1.2 mg/dL   Bilirubin, Direct 0.1 0.0 - 0.3 mg/dL   Alkaline Phosphatase 116 39 - 117 U/L   AST 17 0 - 37 U/L   ALT 12 0 - 35 U/L   Total Protein 6.7 6.0 - 8.3 g/dL   Albumin 4.1 3.5 - 5.2 g/dL  Hemoglobin A1c     Status: None   Collection Time: 08/08/19  9:03 AM  Result Value Ref Range  Hgb A1c MFr Bld 6.3 4.6 - 6.5 %    Comment: Glycemic Control Guidelines for People with Diabetes:Non Diabetic:  <6%Goal of Therapy: <7%Additional Action Suggested:  123456   Basic metabolic panel     Status: Abnormal   Collection Time: 08/08/19  9:03 AM  Result Value Ref Range   Sodium 141 135 - 145 mEq/L   Potassium 4.3 3.5 - 5.1 mEq/L   Chloride 104 96 - 112 mEq/L   CO2 33 (H) 19 - 32 mEq/L   Glucose, Bld 140 (H) 70 - 99 mg/dL   BUN 13 6 - 23 mg/dL   Creatinine, Ser 0.90 0.40 - 1.20 mg/dL   GFR 59.35 (L) >60.00 mL/min   Calcium 9.9 8.4 - 10.5 mg/dL  CBC     Status: None   Collection Time: 10/20/19  7:40 PM  Result Value Ref Range   WBC 7.5 4.0 - 10.5 K/uL   RBC 4.38 3.87 - 5.11 MIL/uL   Hemoglobin 13.1 12.0 - 15.0 g/dL   HCT 41.4 36.0 - 46.0 %   MCV 94.5 80.0 - 100.0 fL   MCH 29.9 26.0 - 34.0 pg   MCHC 31.6 30.0 - 36.0 g/dL   RDW 11.9 11.5 - 15.5 %   Platelets 228 150 - 400 K/uL   nRBC 0.0 0.0 - 0.2 %    Comment: Performed at [facility], [address] [place], [place] [address]  Troponin I (High Sensitivity)     Status: None   Collection Time: 10/20/19  7:40 PM  Result Value Ref Range   Troponin I (High Sensitivity) 7 <18 ng/L    Comment: (NOTE) Elevated  high sensitivity troponin I (hsTnI) values and significant  changes across serial measurements may suggest ACS but many other  chronic and acute conditions are known to elevate hsTnI results.  Refer to the "Links" section for chest pain algorithms and additional  guidance. Performed at [facility], [address] [place], [place] [address]   Comprehensive metabolic panel     Status: Abnormal   Collection Time: 10/20/19  7:40 PM  Result Value Ref Range   Sodium 139 135 - 145 mmol/L   Potassium 4.3 3.5 - 5.1 mmol/L   Chloride 103 98 - 111 mmol/L   CO2 26 22 - 32 mmol/L   Glucose, Bld 104 (H) 70 - 99 mg/dL   BUN 16 8 - 23 mg/dL   Creatinine, Ser 1.01 (H) 0.44 - 1.00 mg/dL   Calcium 9.8 8.9 - 10.3 mg/dL   Total Protein 6.5 6.5 - 8.1 g/dL   Albumin 3.5 3.5 - 5.0 g/dL   AST 18 15 - 41 U/L   ALT 13 0 - 44 U/L   Alkaline Phosphatase 110 38 - 126 U/L   Total Bilirubin 0.7 0.3 - 1.2 mg/dL   GFR calc non Af Amer 50 (L) >60 mL/min   GFR calc Af Amer 58 (L) >60 mL/min   Anion gap 10 5 - 15    Comment: Performed at [facility], [address], [place], [place] [address]  Fibrin derivatives D-Dimer     Status: None   Collection Time: 10/20/19  7:40 PM  Result Value Ref Range   Fibrin derivatives D-dimer ([facility]) 443.61 0.00 - 499.00 ng/mL (FEU)    Comment: (NOTE) <> Exclusion of Venous Thromboembolism (VTE) - OUTPATIENT ONLY   (Emergency Department or [place])   0-499 ng/ml (FEU): With a low to intermediate pretest probability  for VTE this test result excludes the diagnosis                      of VTE.   >499 ng/ml (FEU) : VTE not excluded; additional work up for VTE is                      required. <> Testing on Inpatients and Evaluation of Disseminated Intravascular   Coagulation (DIC) Reference Range:   0-499 ng/ml (FEU) Performed at [facility], [address] [place], [place] [address]   Troponin I (High Sensitivity)      Status: None   Collection Time: 10/20/19  9:59 PM  Result Value Ref Range   Troponin I (High Sensitivity) 7 <18 ng/L    Comment: (NOTE) Elevated high sensitivity troponin I (hsTnI) values and significant  changes across serial measurements may suggest ACS but many other  chronic and acute conditions are known to elevate hsTnI results.  Refer to the "Links" section for chest pain algorithms and additional  guidance. Performed at [facility], [address] [place], [place] [address]   Magnesium     Status: None   Collection Time: 10/20/19 10:57 PM  Result Value Ref Range   Magnesium 2.0 1.7 - 2.4 mg/dL    Comment: Performed at [facility], [address] [place], [place] [address]  POC SARS Coronavirus 2 Ag     Status: None   Collection Time: 10/20/19 11:42 PM  Result Value Ref Range   SARS Coronavirus 2 Ag NEGATIVE NEGATIVE    Comment: (NOTE) SARS-CoV-2 antigen NOT DETECTED.  Negative results are presumptive.  Negative results do not preclude SARS-CoV-2 infection and should not be used as the sole basis for treatment or other patient management decisions, including infection  control decisions, particularly in the presence of clinical signs and  symptoms consistent with COVID-19, or in those who have been in contact with the virus.  Negative results must be combined with clinical observations, patient history, and epidemiological information. The expected result is Negative. Fact Sheet for Patients: PodPark.tn Fact Sheet for Healthcare Providers: GiftContent.is This test is not yet approved or cleared by the [place] FDA and  has been authorized for detection and/or diagnosis of SARS-CoV-2 by FDA under an Emergency Use Authorization (EUA).  This EUA will remain in effect (meaning this test can be used) for the duration of  the COVID-19 de claration under Section 564(b)(1) of the Act,  21 U.S.C. section 360bbb-3(b)(1), unless the authorization is terminated or revoked sooner.   SARS CORONAVIRUS 2 (TAT 6-24 HRS) Nasopharyngeal Nasopharyngeal Swab     Status: None   Collection Time: 10/21/19 12:24 AM   Specimen: Nasopharyngeal Swab  Result Value Ref Range   SARS Coronavirus 2 NEGATIVE NEGATIVE    Comment: (NOTE) SARS-CoV-2 target nucleic acids are NOT DETECTED. The SARS-CoV-2 RNA is generally detectable in upper and lower respiratory specimens during the acute phase of infection. Negative results do not preclude SARS-CoV-2 infection, do not rule out co-infections with other pathogens, and should not be used as the sole basis for treatment or other patient management decisions. Negative results must be combined with clinical observations, patient history, and epidemiological information. The expected result is Negative. Fact Sheet for Patients: SugarRoll.be Fact Sheet for Healthcare Providers: https://www.woods-mathews.com/ This test is not yet approved or cleared by the [place] FDA and  has been authorized for detection and/or diagnosis of SARS-CoV-2 by  FDA under an Emergency Use Authorization (EUA). This EUA will remain  in effect (meaning this test can be used) for the duration of the COVID-19 declaration under Section 56 4(b)(1) of the Act, 21 U.S.C. section 360bbb-3(b)(1), unless the authorization is terminated or revoked sooner. Performed at [facility], [place] [address], [place], [place] [address]   Lipid panel     Status: Abnormal   Collection Time: 10/21/19  7:03 AM  Result Value Ref Range   Cholesterol 175 0 - 200 mg/dL   Triglycerides 195 (H) <150 mg/dL   HDL 33 (L) >40 mg/dL   Total CHOL/HDL Ratio 5.3 RATIO   VLDL 39 0 - 40 mg/dL   LDL Cholesterol 103 (H) 0 - 99 mg/dL    Comment:        Total Cholesterol/HDL:CHD Risk Coronary Heart Disease Risk Table                     Men   Women  1/2  Average Risk   3.4   3.3  Average Risk       5.0   4.4  2 X Average Risk   9.6   7.1  3 X Average Risk  23.4   11.0        Use the calculated Patient Ratio above and the CHD Risk Table to determine the patient's CHD Risk.        ATP III CLASSIFICATION (LDL):  <100     mg/dL   Optimal  100-129  mg/dL   Near or Above                    Optimal  130-159  mg/dL   Borderline  160-189  mg/dL   High  >190     mg/dL   Very High Performed at [facility], [place], [place] [address]   Troponin I (High Sensitivity)     Status: None   Collection Time: 10/21/19  7:03 AM  Result Value Ref Range   Troponin I (High Sensitivity) 8 <18 ng/L    Comment: (NOTE) Elevated high sensitivity troponin I (hsTnI) values and significant  changes across serial measurements may suggest ACS but many other  chronic and acute conditions are known to elevate hsTnI results.  Refer to the "Links" section for chest pain algorithms and additional  guidance. Performed at [facility], [address] [place], [place] [address]   Magnesium     Status: None   Collection Time: 10/21/19  7:03 AM  Result Value Ref Range   Magnesium 2.0 1.7 - 2.4 mg/dL    Comment: Performed at [facility], [address] [place], [place] [address]  Phosphorus     Status: None   Collection Time: 10/21/19  7:03 AM  Result Value Ref Range   Phosphorus 3.9 2.5 - 4.6 mg/dL    Comment: Performed at [facility], [address] [place], [place] [address]  TSH     Status: None   Collection Time: 10/21/19  7:03 AM  Result Value Ref Range   TSH 2.668 0.350 - 4.500 uIU/mL    Comment: Performed by a 3rd Generation assay with a functional sensitivity of <=0.01 uIU/mL. Performed at [facility], [address], [place],  [address]   Comprehensive metabolic panel     Status: Abnormal   Collection Time: 10/21/19  7:03 AM  Result Value Ref Range   Sodium  143 135 - 145  mmol/L   Potassium 4.2 3.5 - 5.1 mmol/L   Chloride 106 98 - 111 mmol/L   CO2 30 22 - 32 mmol/L   Glucose, Bld 114 (H) 70 - 99 mg/dL   BUN 15 8 - 23 mg/dL   Creatinine, Ser 0.88 0.44 - 1.00 mg/dL   Calcium 9.5 8.9 - 10.3 mg/dL   Total Protein 6.3 (L) 6.5 - 8.1 g/dL   Albumin 3.5 3.5 - 5.0 g/dL   AST 15 15 - 41 U/L   ALT 11 0 - 44 U/L   Alkaline Phosphatase 98 38 - 126 U/L   Total Bilirubin 0.7 0.3 - 1.2 mg/dL   GFR calc non Af Amer 59 (L) >60 mL/min   GFR calc Af Amer >60 >60 mL/min   Anion gap 7 5 - 15    Comment: Performed at [facility], [address] [place], [place] [address]  CBC     Status: None   Collection Time: 10/21/19  7:03 AM  Result Value Ref Range   WBC 5.9 4.0 - 10.5 K/uL   RBC 4.11 3.87 - 5.11 MIL/uL   Hemoglobin 12.4 12.0 - 15.0 g/dL   HCT 38.8 36.0 - 46.0 %   MCV 94.4 80.0 - 100.0 fL   MCH 30.2 26.0 - 34.0 pg   MCHC 32.0 30.0 - 36.0 g/dL   RDW 12.0 11.5 - 15.5 %   Platelets 199 150 - 400 K/uL   nRBC 0.0 0.0 - 0.2 %    Comment: Performed at [facility], [address] [place], [place] [address]  ECHOCARDIOGRAM COMPLETE     Status: None   Collection Time: 10/21/19  3:06 PM  Result Value Ref Range   Weight 2,880 oz   Height 65 in   BP 146/92 mmHg    Radiology DG Chest Port 1 View  Result Date: 10/20/2019 CLINICAL DATA:  Chest pain EXAM: PORTABLE CHEST 1 VIEW COMPARISON:  09/03/2018 FINDINGS: Heart and mediastinal contours are within normal limits. No focal opacities or effusions. No acute bony abnormality. IMPRESSION: No active disease. Electronically Signed   By: Rolm Baptise M.D.   On: 10/20/2019 20:04   ECHOCARDIOGRAM COMPLETE  Result Date: 10/21/2019   ECHOCARDIOGRAM REPORT   Patient Name:   Kerri Mills Date of Exam: 10/21/2019 Medical Rec #:  [id number]    Height:       65.0 in Accession #:    [id number]   Weight:       180.0 lb Date of Birth:  06/05/1931    BSA:          1.89 m Patient Age:    6 years     BP:           Not  listed/Not listed mmHg Patient Gender: F            HR:           66 bpm. Exam Location:  [facility] Procedure: 2D Echo, Cardiac Doppler and Color Doppler Indications:     Chest pain 786.50  History:         Patient has prior history of Echocardiogram examinations, most                  recent 12/10/2018. Risk Factors:Hypertension. Persisteny Afib.  Sonographer:     Sherrie Sport RDCS (AE) Referring Phys:  [id number] Otila Kluver LAI Diagnosing Phys: Kathlyn Sacramento MD IMPRESSIONS  1. Left ventricular ejection fraction, by  visual estimation, is 55 to 60%. The left ventricle has normal function. There is moderately increased left ventricular hypertrophy.  2. Left ventricular diastolic parameters are indeterminate.  3. The left ventricle has no regional wall motion abnormalities.  4. Global right ventricle has normal systolic function.The right ventricular size is normal. No increase in right ventricular wall thickness.  5. Left atrial size was mildly dilated.  6. Right atrial size was normal.  7. The mitral valve is normal in structure. Trivial mitral valve regurgitation. No evidence of mitral stenosis.  8. The tricuspid valve is normal in structure. Tricuspid valve regurgitation is trivial.  9. The aortic valve is normal in structure. Aortic valve regurgitation is not visualized. Mild to moderate aortic valve sclerosis/calcification without any evidence of aortic stenosis. 10. The pulmonic valve was normal in structure. Pulmonic valve regurgitation is not visualized. 11. TR signal is inadequate for assessing pulmonary artery systolic pressure. 12. The inferior vena cava is normal in size with greater than 50% respiratory variability, suggesting right atrial pressure of 3 mmHg. FINDINGS  Left Ventricle: Left ventricular ejection fraction, by visual estimation, is 55 to 60%. The left ventricle has normal function. The left ventricle has no regional wall motion abnormalities. There is moderately increased left ventricular hypertrophy.  Asymmetric left ventricular hypertrophy of the septal wall. Left ventricular diastolic parameters are indeterminate. Normal left atrial pressure. Right Ventricle: The right ventricular size is normal. No increase in right ventricular wall thickness. Global RV systolic function is has normal systolic function. The tricuspid regurgitant velocity is 1.87 m/s, and with an assumed right atrial pressure  of 10 mmHg, the estimated right ventricular systolic pressure is TR signal is inadequate for assessing PA pressure at 24.0 mmHg. Left Atrium: Left atrial size was mildly dilated. Right Atrium: Right atrial size was normal in size Pericardium: There is no evidence of pericardial effusion. Mitral Valve: The mitral valve is normal in structure. Trivial mitral valve regurgitation. No evidence of mitral valve stenosis by observation. Tricuspid Valve: The tricuspid valve is normal in structure. Tricuspid valve regurgitation is trivial. Aortic Valve: The aortic valve is normal in structure. Aortic valve regurgitation is not visualized. Mild to moderate aortic valve sclerosis/calcification is present, without any evidence of aortic stenosis. Aortic valve mean gradient measures 2.0 mmHg. Aortic valve peak gradient measures 4.4 mmHg. Aortic valve area, by VTI measures 3.45 cm. Pulmonic Valve: The pulmonic valve was normal in structure. Pulmonic valve regurgitation is not visualized. Pulmonic regurgitation is not visualized. Aorta: The aortic root, ascending aorta and aortic arch are all structurally normal, with no evidence of dilitation or obstruction. Venous: The inferior vena cava is normal in size with greater than 50% respiratory variability, suggesting right atrial pressure of 3 mmHg. IAS/Shunts: No atrial level shunt detected by color flow Doppler. There is no evidence of a patent foramen ovale. No ventricular septal defect is seen or detected. There is no evidence of an atrial septal defect.  LEFT VENTRICLE PLAX 2D LVIDd:          4.47 cm  Diastology LVIDs:         2.77 cm  LV e' lateral:   4.90 cm/s LV PW:         1.16 cm  LV E/e' lateral: 13.0 LV IVS:        1.44 cm  LV e' medial:    3.37 cm/s LVOT diam:     2.10 cm  LV E/e' medial:  18.8 LV SV:  62 ml LV SV Index:   31.74 LVOT Area:     3.46 cm  RIGHT VENTRICLE RV Basal diam:  3.40 cm RV S prime:     12.00 cm/s TAPSE (M-mode): 3.0 cm LEFT ATRIUM             Index       RIGHT ATRIUM           Index LA diam:        3.50 cm 1.85 cm/m  RA Area:     14.80 cm LA Vol (A2C):   78.5 ml 41.50 ml/m RA Volume:   34.00 ml  17.98 ml/m LA Vol (A4C):   60.0 ml 31.72 ml/m LA Biplane Vol: 68.6 ml 36.27 ml/m  AORTIC VALVE                   PULMONIC VALVE AV Area (Vmax):    2.52 cm    PV Vmax:        0.68 m/s AV Area (Vmean):   2.99 cm    PV Peak grad:   1.8 mmHg AV Area (VTI):     3.45 cm    RVOT Peak grad: 3 mmHg AV Vmax:           104.55 cm/s AV Vmean:          64.450 cm/s AV VTI:            0.202 m AV Peak Grad:      4.4 mmHg AV Mean Grad:      2.0 mmHg LVOT Vmax:         76.10 cm/s LVOT Vmean:        55.700 cm/s LVOT VTI:          0.201 m LVOT/AV VTI ratio: 1.00  AORTA Ao Root diam: 2.80 cm MITRAL VALVE                        TRICUSPID VALVE MV Area (PHT): 3.12 cm             TR Peak grad:   14.0 mmHg MV PHT:        70.47 msec           TR Vmax:        187.00 cm/s MV Decel Time: 243 msec MV E velocity: 63.50 cm/s 103 cm/s  SHUNTS MV A velocity: 99.40 cm/s 70.3 cm/s Systemic VTI:  0.20 m MV E/A ratio:  0.64       1.5       Systemic Diam: 2.10 cm  Kathlyn Sacramento MD Electronically signed by Kathlyn Sacramento MD Signature Date/Time: 10/21/2019/3:28:07 PM    Final     Assessment/Plan Essential hypertension blood pressure control important in reducing the progression of atherosclerotic disease. On appropriate oral medications.   Atrial fibrillation On Eliquis, rate controlled  Varicose veins of both lower extremities with inflammation Doing well status post venous  interventions in the past.  No current issues.  Return to clinic as needed.    Leotis Pain, MD  10/29/2019 11:57 AM    This note was created with Dragon medical transcription system.  Any errors from dictation are purely unintentional

## 2019-10-30 DIAGNOSIS — H353221 Exudative age-related macular degeneration, left eye, with active choroidal neovascularization: Secondary | ICD-10-CM | POA: Diagnosis not present

## 2019-10-30 DIAGNOSIS — Z961 Presence of intraocular lens: Secondary | ICD-10-CM | POA: Diagnosis not present

## 2019-10-30 NOTE — Telephone Encounter (Signed)
Refill Request.  

## 2019-10-30 NOTE — Telephone Encounter (Signed)
Pt's age 86, wt 80.6 kg, SCr 0.88, CrCl 58.17, last ov w/ TG 12/12/18, sched to see CB 10/31/19.

## 2019-10-31 ENCOUNTER — Other Ambulatory Visit: Payer: Self-pay

## 2019-10-31 ENCOUNTER — Ambulatory Visit (INDEPENDENT_AMBULATORY_CARE_PROVIDER_SITE_OTHER): Payer: Medicare Other | Admitting: Nurse Practitioner

## 2019-10-31 ENCOUNTER — Encounter: Payer: Self-pay | Admitting: Nurse Practitioner

## 2019-10-31 VITALS — BP 130/82 | HR 79 | Ht 65.0 in | Wt 176.5 lb

## 2019-10-31 DIAGNOSIS — R0789 Other chest pain: Secondary | ICD-10-CM | POA: Diagnosis not present

## 2019-10-31 DIAGNOSIS — E782 Mixed hyperlipidemia: Secondary | ICD-10-CM | POA: Diagnosis not present

## 2019-10-31 DIAGNOSIS — I4819 Other persistent atrial fibrillation: Secondary | ICD-10-CM

## 2019-10-31 DIAGNOSIS — I10 Essential (primary) hypertension: Secondary | ICD-10-CM | POA: Diagnosis not present

## 2019-10-31 NOTE — Patient Instructions (Signed)
Medication Instructions:  Your physician recommends that you continue on your current medications as directed. Please refer to the Current Medication list given to you today.  *If you need a refill on your cardiac medications before your next appointment, please call your pharmacy*  Lab Work: None ordered  If you have labs (blood work) drawn today and your tests are completely normal, you will receive your results only by: Marland Kitchen MyChart Message (if you have MyChart) OR . A paper copy in the mail If you have any lab test that is abnormal or we need to change your treatment, we will call you to review the results.  Testing/Procedures: None ordered   Follow-Up: At [facility], you and your health needs are our priority.  As part of our continuing mission to provide you with exceptional heart care, we have created designated Provider Care Teams.  These Care Teams include your primary Cardiologist (physician) and Advanced Practice Providers (APPs -  Physician Assistants and Nurse Practitioners) who all work together to provide you with the care you need, when you need it.  Your next appointment:   3 month(s)  The format for your next appointment:   In Person  Provider:    You may see Ida Rogue, MD or Murray Hodgkins, NP.

## 2019-10-31 NOTE — Progress Notes (Signed)
Office Visit    Patient Name: Kerri Mills Date of Encounter: 10/31/2019  Primary Care Provider:  Einar Pheasant, MD Primary Cardiologist:  Ida Rogue, MD  Chief Complaint    86 y/o ? w/ a h/o persistent afib, HTN, HL, demand ischemia, LBBB, remote DVT (1992), OSA, breast cancer status post lumpectomy and radiation therapy, chronic venous insufficiency with lower extremity edema, thyroid cancer, melanoma, depression, gastric ulcer, GERD, diverticulitis, who presents for follow-up after recent admission for atypical chest pain.  Past Medical History    Past Medical History:  Diagnosis Date  . Allergy   . Anxiety   . Arthritis   . Atypical chest pain    a. 10/2018 MV: small, fixed apical defect possibly 2/2 attenuation artifact. No ischemia.  EF 68%.  . Clotting disorder ([place])   . Colitis   . Depression   . Diverticulitis 2013  . Gastric ulcer   . GERD (gastroesophageal reflux disease)   . History of echocardiogram    a. 09/2019 Echo: EF 55-60%, mod LVH. Mildly dil LA. Triv MR/TR.  Marland Kitchen Hypercholesterolemia   . Hypertension   . Infiltrating lobular carcinoma of left breast 2011   T2,N0, ER: 90%; PR 0%; Her 2 neu not amplified. [facility]  . Melanoma ([place]) 1997  . Melanoma in situ of upper extremity ([place]) 03/19/2011  . Persistent atrial fibrillation ([place])    a. CHADS2VASc => 4 (HTN, age x 2, female)  . Personal history of radiation therapy 2011   BREAST CA  . Seroma    HISTORY OF LFT BREAST  . Sleep apnea   . Thyroid cancer ([place]) 1992   Past Surgical History:  Procedure Laterality Date  . ABDOMINAL HYSTERECTOMY  1973   partial  . BREAST BIOPSY Left 02-13-13   BENIGN BREAST TISSUE WITH CHANGES CONSISTENT WITH FAT NECROSIS  . BREAST BIOPSY Left 01/21/2015   bx done in [facility] office 11:00 left 6-8cmfn  . BREAST EXCISIONAL BIOPSY Left 1995   neg  . BREAST EXCISIONAL BIOPSY Left 2011   Breast cancer radiation  . BREAST LUMPECTOMY Left 2011   BREAST CA    . CARDIAC CATHETERIZATION    . CHOLECYSTECTOMY    . COLONOSCOPY  2013  . MELANOMA EXCISION     RT UPPER ARM  . PARTIAL HYSTERECTOMY     bleeding, ovaries in place.    . THYROID SURGERY  1992   FOR THYROID CANCER  . TONSILLECTOMY      Allergies  Allergies  Allergen Reactions  . Atorvastatin     Other reaction(s): Other (See Comments) STIFFNESS AND SORE STIFFNESS AND SORE  . Lipitor [Atorvastatin Calcium] Other (See Comments)    Stiffness & soreness  . Penicillins Rash    REACTION: Unknown reaction    History of Present Illness    86 y/o ? w/ a h/o persistent afib, HTN, HL, demand ischemia, LBBB, remote DVT (1992), OSA, breast cancer status post lumpectomy and radiation therapy, chronic venous insufficiency with lower extremity edema, thyroid cancer, melanoma, depression, gastric ulcer, GERD, diverticulitis, and chest pain.  In 09/2017 she was admitted with chest pain and atrial fibrillation with rapid ventricular response.  Echo showed normal LV function with grade 1 diastolic dysfunction.  She subsequently converted to sinus rhythm and developed bradycardia on both beta-blocker and diltiazem therapy resulting in recommendation for discontinuation, though she did continue Toprol and as needed propranolol following hospitalization and this was later discontinued.  She has been anticoagulated with  Eliquis.  In February 2020, she was seen with complaints of crushing chest pain of 2 weeks duration.  Lexiscan stress testing was undertaken and was low risk/nonischemic.  On January 24 of this year, she was admitted to [facility] with electrical shocklike chest pain.  Lab work was unremarkable and troponin was normal.  She was admitted and ruled out.  She was noted to have frequent PACs and PVCs in the emergency department.  She was placed back on low-dose metoprolol.  Echocardiogram showed normal LV function.  In the absence of objective evidence ischemia with normal cardiac markers and  atypical symptoms, it was not felt that she required any further ischemic evaluation, especially as prior stress test in 2018 was nonischemic.  Since discharge, she has done well.  She has had no recurrent chest pain.  She has chronic lower extremity swelling which she manages with compression stockings and she is seen by vascular surgery related to venous insufficiency.  She denies dyspnea, PND, orthopnea, dizziness, syncope, or early satiety.  Home Medications    Prior to Admission medications   Medication Sig Start Date End Date Taking? Authorizing Provider  albuterol (PROAIR HFA) 108 (90 Base) MCG/ACT inhaler Inhale 2 puffs into the lungs every 6 (six) hours as needed for wheezing or shortness of breath. 08/10/18   Einar Pheasant, MD  Cholecalciferol (VITAMIN D) 1000 UNITS capsule Take 1,000 Units by mouth daily.      [provider]  DULoxetine (CYMBALTA) 60 MG capsule TAKE 1 CAPSULE (60 MG TOTAL) BY MOUTH AT BEDTIME. 09/11/19   Einar Pheasant, MD  ELIQUIS 5 MG TABS tablet TAKE 1 TABLET BY MOUTH TWICE A DAY 10/30/19   Gollan, Kathlene November, MD  furosemide (LASIX) 20 MG tablet TAKE 1 TABLET (20 MG TOTAL) BY MOUTH AS DIRECTED. TAKE 1 TABLET (20 MG) TWICE A WEEK WITH POTASSIUM 09/13/19 12/12/19  Minna Merritts, MD  gabapentin (NEURONTIN) 300 MG capsule TAKE 2 CAPSULES (600 MG TOTAL) BY MOUTH AT BEDTIME. 09/17/19   Einar Pheasant, MD  levothyroxine (SYNTHROID) 88 MCG tablet Take 1 tablet (88 mcg total) by mouth daily before breakfast. TAKE ONE TABLET BY MOUTH ONCE DAILY BEFORE BREAKFAST 10/21/19   Enzo Bi, MD  lisinopril (ZESTRIL) 20 MG tablet TAKE 1 TABLET BY MOUTH EVERY DAY 03/22/19   Einar Pheasant, MD  lovastatin (MEVACOR) 40 MG tablet Take 1 tablet (40 mg total) by mouth at bedtime. 10/21/19   Enzo Bi, MD  metoprolol succinate (TOPROL XL) 25 MG 24 hr tablet Take 0.5 tablets (12.5 mg total) by mouth daily. This is the last dose of Toprol you were on. 10/21/19 11/20/19  Enzo Bi, MD    Multiple Vitamins-Minerals (PRESERVISION AREDS 2) CAPS Take 1 tablet by mouth 2 (two) times daily.     [provider]  omeprazole (PRILOSEC) 20 MG capsule TAKE 1 CAPSULE (20 MG TOTAL) BY MOUTH DAILY. 11/23/18   Einar Pheasant, MD  potassium chloride (K-DUR) 10 MEQ tablet Take 2 tablets (20 mEq total) by mouth as directed. Take 2 tablets (20 mEq) twice a week with Lasix 10/03/18   Minna Merritts, MD  triamcinolone cream (KENALOG) 0.1 % Apply 1 application topically as needed (rash). 10/21/19   Enzo Bi, MD    Review of Systems    Chronic, lower ext swelling in the setting of venous insuff.  She denies chest pain, palpitations, dyspnea, pnd, orthopnea, n, v, dizziness, syncope, weight gain, or early satiety.  All other systems reviewed and are  otherwise negative except as noted above.  Physical Exam    VS:  BP 130/82 (BP Location: Left Arm, Patient Position: Sitting, Cuff Size: Normal)   Pulse 79   Ht 5\' 5"  (1.651 m)   Wt 176 lb 8 oz (80.1 kg)   SpO2 97%   BMI 29.37 kg/m  , BMI Body mass index is 29.37 kg/m. GEN: Well nourished, well developed, in no acute distress. HEENT: normal. Neck: Supple, no JVD, carotid bruits, or masses. Cardiac: RRR, no murmurs, rubs, or gallops. No clubbing, cyanosis, edema.  Radials/PT 2+ and equal bilaterally.  Respiratory:  Respirations regular and unlabored, clear to auscultation bilaterally. GI: Soft, nontender, nondistended, BS + x 4. MS: no deformity or atrophy. Skin: warm and dry, no rash. Neuro:  Strength and sensation are intact. Psych: Normal affect.  Accessory Clinical Findings    ECG personally reviewed by me today -regular sinus rhythm, 79, left axis deviation, nonspecific ST changes- no acute changes.  Lab Results  Component Value Date   WBC 5.9 10/21/2019   HGB 12.4 10/21/2019   HCT 38.8 10/21/2019   MCV 94.4 10/21/2019   PLT 199 10/21/2019   Lab Results  Component Value Date   CREATININE 0.88 10/21/2019   BUN 15  10/21/2019   NA 143 10/21/2019   K 4.2 10/21/2019   CL 106 10/21/2019   CO2 30 10/21/2019   Lab Results  Component Value Date   ALT 11 10/21/2019   AST 15 10/21/2019   ALKPHOS 98 10/21/2019   BILITOT 0.7 10/21/2019   Lab Results  Component Value Date   CHOL 175 10/21/2019   HDL 33 (L) 10/21/2019   LDLCALC 103 (H) 10/21/2019   LDLDIRECT 75.0 03/24/2017   TRIG 195 (H) 10/21/2019   CHOLHDL 5.3 10/21/2019    Lab Results  Component Value Date   HGBA1C 6.3 08/08/2019    Assessment & Plan    1.  Atypical chest pain: Patient with recent admission for atypical chest pain.  She ruled out.  Echo showed normal LV function.  She previously underwent stress testing 2019, which was nonischemic.  She has had no recurrence of chest pain since discharge.  No further ischemic work-up warranted at this time.  2.  Asymptomatic PVCs: Noted during hospitalization.  Quiescent today.  She was discharged home on Toprol-XL 12.5 mg daily.  I do note that she previously had bradycardia on metoprolol and diltiazem and then metoprolol and propranolol therapy (using as needed for tremors).  She is not bradycardic today and no longer has propranolol on her list.  We did discuss potentially placing a Holter monitor to assess PVC burden.  She was not interested in this today.  3.  Essential hypertension: Stable.  4.  Hyperlipidemia: She remains on statin therapy with an LDL of 103 in January.  5.  Persistent atrial fibrillation: Remains in sinus.  On low-dose beta-blocker and rate controlled.  No bradycardia.  Continue oral anticoagulation.    6.  Disposition: Follow-up in clinic in 3 months or sooner if necessary.   Murray Hodgkins, NP 10/31/2019, 6:32 PM

## 2019-11-02 ENCOUNTER — Encounter: Payer: Self-pay | Admitting: Internal Medicine

## 2019-11-02 DIAGNOSIS — J411 Mucopurulent chronic bronchitis: Secondary | ICD-10-CM | POA: Insufficient documentation

## 2019-11-02 NOTE — Assessment & Plan Note (Signed)
Controlled.  

## 2019-11-02 NOTE — Assessment & Plan Note (Signed)
On lovastatin.  Low cholesterol diet and exercise.  Follow lipid panel and liver function tests.   

## 2019-11-02 NOTE — Assessment & Plan Note (Signed)
Followed by pulmonary.  Breathing stable.  Follow.   

## 2019-11-02 NOTE — Assessment & Plan Note (Signed)
Blood pressure has been under good control.  Continue current medication regimen.  Follow pressures.  Follow metabolic panel.  

## 2019-11-02 NOTE — Assessment & Plan Note (Signed)
Planning for sleep study.

## 2019-11-02 NOTE — Assessment & Plan Note (Signed)
Persistent pain.  Has tried therapy and exercise.  Agreeable to ortho referral.

## 2019-11-02 NOTE — Assessment & Plan Note (Signed)
Low carb diet and exercise.  Follow met b and a1c.  

## 2019-11-02 NOTE — Assessment & Plan Note (Signed)
Recently admitted with chest pain.  Ruled out.  ECHO with normal EF.  No wall motion abnormality.  Stress test negative for ischemia 2019.  Discharged on toprol 1/2 tablet q day.  No chest pain since discharge.  Follow.

## 2019-11-02 NOTE — Assessment & Plan Note (Signed)
Doing well on current regimen.  On low dose beta blocker now.  Doing well.  Follow.  Has f/u this week with cardiology.

## 2019-11-07 ENCOUNTER — Ambulatory Visit (INDEPENDENT_AMBULATORY_CARE_PROVIDER_SITE_OTHER): Payer: Medicare Other | Admitting: Internal Medicine

## 2019-11-07 ENCOUNTER — Encounter: Payer: Self-pay | Admitting: Internal Medicine

## 2019-11-07 DIAGNOSIS — R918 Other nonspecific abnormal finding of lung field: Secondary | ICD-10-CM | POA: Diagnosis not present

## 2019-11-07 DIAGNOSIS — J411 Mucopurulent chronic bronchitis: Secondary | ICD-10-CM

## 2019-11-07 DIAGNOSIS — J479 Bronchiectasis, uncomplicated: Secondary | ICD-10-CM

## 2019-11-07 NOTE — Patient Instructions (Signed)
Obtain CT of the chest in 1 year  continue flutter valve device 10-15 times per day

## 2019-11-07 NOTE — Progress Notes (Addendum)
I connected with the patient by telephone enabled telemedicine visit and verified that I am speaking with the correct person using two identifiers.    I discussed the limitations, risks, security and privacy concerns of performing an evaluation and management service by telemedicine and the availability of in-person appointments. I also discussed with the patient that there may be a patient responsible charge related to this service. The patient expressed understanding and agreed to proceed.  PATIENT AGREES AND CONFIRMS -YES   Other persons participating in the visit and their role in the encounter: Patient, nursing  This visit type was conducted due to national recommendations for restrictions regarding the COVID-19 Pandemic (e.g. social distancing).  This format is felt to be most appropriate for this patient at this time.  All issues noted in this document were discussed and addressed.        Name: Kerri Mills MRN: [id number] DOB: 1930-11-17     CONSULTATION DATE: 11/07/2019 REFERRING MD : Nicki Reaper   CHIEF COMPLAINT:  Follow up productive cough   HISTORY OF PRESENT ILLNESS: Patient has a chronic productive cough diagnosed with chronic bronchitis and chronic bronchiectasis This has been going on for many many many years Patient does have a prescribed flutter valve which he uses 10-15 times per day and it has helped with her symptoms  Patient does have a history of infections and pneumonias in the past CT of the chest reviewed from December 2020 which shows subcentimeter lung nodules and bilateral bronchiectasis that are not significant at this time however will need repeat CT chest in 1 year Suggestive of mucoid impaction At this time there is no signs of infection No signs of inflammation   No evidence of exacerbation at this time   no indication for antibiotics or steroids at this time   She is a non-smoker She does have history of secondhand smoke exposure Family  history of emphysema March 2020 CT chest shows subcentimeter nodular opacities bilateral bronchiectasis   No  exacerbation at this time No evidence of heart failure at this time No evidence or signs of infection at this time No respiratory distress No fevers, chills, nausea, vomiting, diarrhea No evidence of lower extremity edema No evidence hemoptysis  Patient has a previous history of sleep apnea Does have excessive daytime sleepiness and fatigue However patient has not had any opportunity to obtain home sleep study       PAST MEDICAL HISTORY :   has a past medical history of Allergy, Anxiety, Arthritis, Atypical chest pain, Clotting disorder ([place]), Colitis, Depression, Diverticulitis (2013), Gastric ulcer, GERD (gastroesophageal reflux disease), History of echocardiogram, Hypercholesterolemia, Hypertension, Infiltrating lobular carcinoma of left breast (2011), Melanoma ([place]) (1997), Melanoma in situ of upper extremity ([place]) (03/19/2011), Persistent atrial fibrillation ([place]), Personal history of radiation therapy (2011), Seroma, Sleep apnea, and Thyroid cancer ([place]) (1992).  has a past surgical history that includes Tonsillectomy; Cholecystectomy; Melanoma excision; Thyroid surgery (1992); Partial hysterectomy; Colonoscopy (2013); Abdominal hysterectomy (1973); Breast lumpectomy (Left, 2011); Cardiac catheterization; Breast biopsy (Left, 02-13-13); Breast excisional biopsy (Left, 1995); Breast excisional biopsy (Left, 2011); and Breast biopsy (Left, 01/21/2015). Prior to Admission medications   Medication Sig Start Date End Date Taking? Authorizing Provider  albuterol (PROAIR HFA) 108 (90 Base) MCG/ACT inhaler Inhale 2 puffs into the lungs every 6 (six) hours as needed for wheezing or shortness of breath. 08/10/18   Einar Pheasant, MD  Cholecalciferol (VITAMIN D) 1000 UNITS capsule Take 1,000 Units by mouth daily.  [provider]  DULoxetine (CYMBALTA) 60 MG capsule TAKE 1  CAPSULE (60 MG TOTAL) BY MOUTH AT BEDTIME. 03/12/19   Einar Pheasant, MD  ELIQUIS 5 MG TABS tablet TAKE 1 TABLET BY MOUTH TWICE A DAY 03/11/19   Minna Merritts, MD  ferrous sulfate 325 (65 FE) MG tablet Take 325 mg by mouth every morning.    [provider]  furosemide (LASIX) 20 MG tablet Take 1 tablet (20 mg total) by mouth as directed. Take 1 tablet (20 mg) twice a week with potassium 10/03/18 01/01/19  Minna Merritts, MD  gabapentin (NEURONTIN) 300 MG capsule TAKE 2 CAPSULES (600 MG TOTAL) BY MOUTH AT BEDTIME. 03/06/19   Einar Pheasant, MD  levothyroxine (SYNTHROID) 88 MCG tablet TAKE ONE TABLET BY MOUTH ONCE DAILY BEFORE BREAKFAST 03/12/19   Einar Pheasant, MD  lisinopril (ZESTRIL) 20 MG tablet TAKE 1 TABLET BY MOUTH EVERY DAY 02/01/19   Einar Pheasant, MD  lovastatin (MEVACOR) 40 MG tablet TAKE 1 TABLET BY MOUTH EVERY DAY 11/23/18   Einar Pheasant, MD  Multiple Vitamins-Minerals (PRESERVISION AREDS 2) CAPS Take 1 tablet by mouth 2 (two) times daily.     [provider]  omeprazole (PRILOSEC) 20 MG capsule TAKE 1 CAPSULE (20 MG TOTAL) BY MOUTH DAILY. 11/23/18   Einar Pheasant, MD  potassium chloride (K-DUR) 10 MEQ tablet Take 2 tablets (20 mEq total) by mouth as directed. Take 2 tablets (20 mEq) twice a week with Lasix 10/03/18   Gollan, Kathlene November, MD  propranolol (INDERAL) 10 MG tablet Take 1 tablet (10 mg total) by mouth 3 (three) times daily as needed. 03/05/19   Minna Merritts, MD  triamcinolone cream (KENALOG) 0.1 % Apply 1 application topically 2 (two) times daily. 08/10/18   Einar Pheasant, MD  vitamin B-12 (CYANOCOBALAMIN) 1000 MCG tablet Take 1,000 mcg by mouth daily.    [provider]   Allergies  Allergen Reactions  . Atorvastatin     Other reaction(s): Other (See Comments) STIFFNESS AND SORE STIFFNESS AND SORE  . Lipitor [Atorvastatin Calcium] Other (See Comments)    Stiffness & soreness  . Penicillins Rash    REACTION: Unknown reaction     FAMILY HISTORY:  family history includes Cancer in her brother and sister; Heart disease in her mother. SOCIAL HISTORY:  reports that she has never smoked. She has never used smokeless tobacco. She reports current alcohol use. She reports that she does not use drugs.    Review of Systems:  Gen:  Denies  fever, sweats, chills weight loss  HEENT: Denies blurred vision, double vision, ear pain, eye pain, hearing loss, nose bleeds, sore throat Cardiac:  No dizziness, chest pain or heaviness, chest tightness,edema, No JVD Resp:   No cough, -sputum production, -shortness of breath,-wheezing, -hemoptysis,  Gi: Denies swallowing difficulty, stomach pain, nausea or vomiting, diarrhea, constipation, bowel incontinence Gu:  Denies bladder incontinence, burning urine Ext:   Denies Joint pain, stiffness or swelling Skin: Denies  skin rash, easy bruising or bleeding or hives Endoc:  Denies polyuria, polydipsia , polyphagia or weight change Psych:   Denies depression, insomnia or hallucinations  Other:  All other systems negative       IMAGING        The CXR was Independently Reviewed By Me Today CXR reviewed-B/L bronchiectasis     ASSESSMENT AND PLAN SYNOPSIS   Chronic productive cough consistent with chronic bronchitis in the setting of bilateral bronchiectasis based on CT chest findings with previous  history of pneumonias and bronchitis No signs of infection at this time No indication for antibiotics or prednisone at this time Patient is to continue flutter valve 10-15 times per day-has helped her symptoms significantly I also recommend full daily exercise as tolerated Albuterol as needed CONSIDER STOPPING ACE INH BUT SHE WILL NEED TO ADDRESS THIS WITH DR Rockey Situ   Patient has a previous diagnosis of sleep apnea approximately 10 years ago Patient states she has problems breathing at nighttime and has snoring Sleep study was ordered several months ago however patient has  not [facility] Patient states she has no opportunity or time to do that at this time We will defer assessment for sleep apnea at this time based on patient request  CT of the chest shows multi subcentimeter nodules along with bronchiectasis Recommend repeat CT chest in 1 year for further evaluation   COVID-19 EDUCATION: The signs and symptoms of COVID-19 were discussed with the patient and how to seek care for testing.  The importance of social distancing was discussed today. Hand Washing Techniques and avoid touching face was advised.     MEDICATION ADJUSTMENTS/LABS AND TESTS ORDERED: Continue flutter valve 10-15 times per day  Albuterol as needed  We will evaluate for sleep apnea at later point based on patient request CONSIDER STOPPING ACE INH for [place]   Patient satisfied with Plan of action and management. All questions answered  Follow up in 6 months  Total time spent 21 minutes Corrin Parker, M.D.  [facility]  Medical Director [place] Director [facility] Cardio-Pulmonary Department

## 2019-11-11 DIAGNOSIS — M7062 Trochanteric bursitis, left hip: Secondary | ICD-10-CM | POA: Diagnosis not present

## 2019-11-11 DIAGNOSIS — M25552 Pain in left hip: Secondary | ICD-10-CM | POA: Diagnosis not present

## 2019-12-06 ENCOUNTER — Ambulatory Visit (INDEPENDENT_AMBULATORY_CARE_PROVIDER_SITE_OTHER): Payer: Medicare Other | Admitting: Podiatry

## 2019-12-06 ENCOUNTER — Encounter: Payer: Self-pay | Admitting: Podiatry

## 2019-12-06 ENCOUNTER — Other Ambulatory Visit: Payer: Self-pay

## 2019-12-06 DIAGNOSIS — M2042 Other hammer toe(s) (acquired), left foot: Secondary | ICD-10-CM | POA: Diagnosis not present

## 2019-12-06 DIAGNOSIS — L989 Disorder of the skin and subcutaneous tissue, unspecified: Secondary | ICD-10-CM

## 2019-12-06 NOTE — Patient Instructions (Signed)
Pre-Operative Instructions  Congratulations, you have decided to take an important step towards improving your quality of life.  You can be assured that the doctors and staff at [facility] will be with you every step of the way.  Here are some important things you should know:  1. Plan to be at the surgery center/hospital at least 1 (one) hour prior to your scheduled time, unless otherwise directed by the surgical center/hospital staff.  You must have a responsible adult accompany you, remain during the surgery and drive you home.  Make sure you have directions to the surgical center/hospital to ensure you arrive on time. 2. If you are having surgery at [facility] or [facility], you will need a copy of your medical history and physical form from your family physician within one month prior to the date of surgery. We will give you a form for your primary physician to complete.  3. We make every effort to accommodate the date you request for surgery.  However, there are times where surgery dates or times have to be moved.  We will contact you as soon as possible if a change in schedule is required.   4. No aspirin/ibuprofen for one week before surgery.  If you are on aspirin, any non-steroidal anti-inflammatory medications (Mobic, Aleve, Ibuprofen) should not be taken seven (7) days prior to your surgery.  You make take Tylenol for pain prior to surgery.  5. Medications - If you are taking daily heart and blood pressure medications, seizure, reflux, allergy, asthma, anxiety, pain or diabetes medications, make sure you notify the surgery center/hospital before the day of surgery so they can tell you which medications you should take or avoid the day of surgery. 6. No food or drink after midnight the night before surgery unless directed otherwise by surgical center/hospital staff. 7. No alcoholic beverages 24-hours prior to surgery.  No smoking 24-hours prior or 24-hours after  surgery. 8. Wear loose pants or shorts. They should be loose enough to fit over bandages, boots, and casts. 9. Don't wear slip-on shoes. Sneakers are preferred. 10. Bring your boot with you to the surgery center/hospital.  Also bring crutches or a walker if your physician has prescribed it for you.  If you do not have this equipment, it will be provided for you after surgery. 11. If you have not been contacted by the surgery center/hospital by the day before your surgery, call to confirm the date and time of your surgery. 12. Leave-time from work may vary depending on the type of surgery you have.  Appropriate arrangements should be made prior to surgery with your employer. 13. Prescriptions will be provided immediately following surgery by your doctor.  Fill these as soon as possible after surgery and take the medication as directed. Pain medications will not be refilled on weekends and must be approved by the doctor. 14. Remove nail polish on the operative foot and avoid getting pedicures prior to surgery. 15. Wash the night before surgery.  The night before surgery wash the foot and leg well with water and the antibacterial soap provided. Be sure to pay special attention to beneath the toenails and in between the toes.  Wash for at least three (3) minutes. Rinse thoroughly with water and dry well with a towel.  Perform this wash unless told not to do so by your physician.  Enclosed: 1 Ice pack (please put in freezer the night before surgery)   1 Hibiclens skin cleaner     Pre-op instructions  If you have any questions regarding the instructions, please do not hesitate to call our office.  [place]: [address], [place], [place] [address] -- [phone]  [place]: [address] , [place] [address] -- [phone]  [place]: [address], [place], [place] [address] -- [phone]   Website: [email or web address] 

## 2019-12-08 ENCOUNTER — Other Ambulatory Visit: Payer: Self-pay | Admitting: Physician Assistant

## 2019-12-08 ENCOUNTER — Other Ambulatory Visit: Payer: Self-pay | Admitting: Cardiovascular Disease

## 2019-12-08 ENCOUNTER — Other Ambulatory Visit: Payer: Self-pay | Admitting: Internal Medicine

## 2019-12-09 NOTE — Progress Notes (Signed)
   HPI: 86 y.o. female presenting today with a chief complaint of painful callus lesions noted to bilateral feet that have been present for the past several months. Walking barefoot increases the pain. She has not had any recent treatment for her symptoms. Patient is here for further evaluation and treatment.   Past Medical History:  Diagnosis Date  . Allergy   . Anxiety   . Arthritis   . Atypical chest pain    a. 10/2018 MV: small, fixed apical defect possibly 2/2 attenuation artifact. No ischemia.  EF 68%.  . Clotting disorder ([place])   . Colitis   . Depression   . Diverticulitis 2013  . Gastric ulcer   . GERD (gastroesophageal reflux disease)   . History of echocardiogram    a. 09/2019 Echo: EF 55-60%, mod LVH. Mildly dil LA. Triv MR/TR.  Marland Kitchen Hypercholesterolemia   . Hypertension   . Infiltrating lobular carcinoma of left breast 2011   T2,N0, ER: 90%; PR 0%; Her 2 neu not amplified. [facility]).  . Melanoma ([place]) 1997  . Melanoma in situ of upper extremity ([place]) 03/19/2011  . Persistent atrial fibrillation ([place])    a. CHADS2VASc => 4 (HTN, age x 2, female)  . Personal history of radiation therapy 2011   BREAST CA  . Seroma    HISTORY OF LFT BREAST  . Sleep apnea   . Thyroid cancer ([place]) 1992      Objective: Physical Exam General: The patient is alert and oriented x3 in no acute distress.  Dermatology: Skin is cool, dry and supple bilateral lower extremities. Negative for open lesions or macerations.  Vascular: Palpable pedal pulses bilaterally. No edema or erythema noted. Capillary refill within normal limits.  Neurological: Epicritic and protective threshold grossly intact bilaterally.   Musculoskeletal Exam: All pedal and ankle joints range of motion within normal limits bilateral. Muscle strength 5/5 in all groups bilateral. Hammertoe contracture deformity noted to the 2nd digit of the left foot.   Assessment: 1. Pre-ulcerative callus lesions noted to the  bilateral feet x 2 2. Hammertoe contracture left 2nd digit    Plan of Care:  1. Patient evaluated.  2. Excisional debridement of keratotic lesion(s) using a chisel blade was performed without incident. Light dressing applied.  3. Today we discussed the conservative versus surgical management of the presenting pathology. The patient opts for surgical management. All possible complications and details of the procedure were explained. All patient questions were answered. No guarantees were expressed or implied. 4. Authorization for surgery was initiated today. Surgery will consist of PIPJ arthroplasty with 2nd MPJ capsulotomy left 2nd digit.  5. Return to clinic one week post op.      Edrick Kins, DPM [facility]  Dr. Edrick Kins, DPM    [address], [place] [address]                Office [phone]  Fax [phone]

## 2019-12-10 ENCOUNTER — Telehealth: Payer: Self-pay | Admitting: Cardiovascular Disease

## 2019-12-10 ENCOUNTER — Telehealth: Payer: Self-pay | Admitting: Internal Medicine

## 2019-12-10 NOTE — Telephone Encounter (Signed)
Patient aware.

## 2019-12-10 NOTE — Telephone Encounter (Signed)
If she has been taking 10mg , then remain on 10mg .  Will need to f/u regarding how blood pressures are doing.

## 2019-12-10 NOTE — Telephone Encounter (Signed)
Called patient. Confirmed no acute issues. Patients lisinopril was increased from 10 to 20 mg on 11/21/2018. Pharmacy continued to fill old prescription of 10 mg so patient has not been on the new dose > 1 year. She has not been monitoring her BP but does have a cuff at home. She has one week left of the 10 mg. I have advised her to continue this unless she is notified otherwise, let me know what her blood pressures are prior to running out and we can determine if she needs to stay on the 10 mg or increase to the 20 mg. Cardiology had previously prescribed but dose was change by you. Are you ok with this?

## 2019-12-10 NOTE — Telephone Encounter (Signed)
Pt c/o medication issue:  1. Name of Medication: Lisinopril  2. How are you currently taking this medication (dosage and times per day? 10 mg daily  3. Are you having a reaction (difficulty breathing--STAT)? no  4. What is your medication issue? Patient went to pick up prescription is the pharmacist told her it was a 20 mg daily dose this time. Patient stated she never was told about this change and refused the medication  Please advise patient on what is correct

## 2019-12-10 NOTE — Telephone Encounter (Signed)
Spoke with patient and I reviewed her chart where Dr. Nicki Reaper increased her dose to 20 mg last year 03/22/19. I suspect the pharmacy continued filling old prescription of 10 mg and they requested refill and it was sent in for the 20 mg dose based off her current list. She did not get the medication and recommended that she reach out to Dr. Nicki Reaper to make her aware during this time she has only been taking 10 mg daily instead of the 20 mg increased dose. She verbalized understanding and will reach out to her primary care provider to make her aware and review. She had no further questions at this time.

## 2019-12-10 NOTE — Telephone Encounter (Signed)
Pt called in and wants to know when was her lisinopril increased to 20 mg. She said she was unaware of the change and wants to know should she pick this prescription up. Please advise.

## 2019-12-19 ENCOUNTER — Telehealth: Payer: Self-pay

## 2019-12-19 ENCOUNTER — Ambulatory Visit
Admission: RE | Admit: 2019-12-19 | Discharge: 2019-12-19 | Disposition: A | Discharge status: Home / Self Care | Payer: Medicare Other | Source: Ambulatory Visit | Attending: General Surgery | Admitting: General Surgery

## 2019-12-19 DIAGNOSIS — Z1231 Encounter for screening mammogram for malignant neoplasm of breast: Secondary | ICD-10-CM | POA: Insufficient documentation

## 2019-12-19 NOTE — Telephone Encounter (Addendum)
DOS 01/09/20  CAPSULOTOMY, MPJ RELEASE JOINT 2ND LT- 28270 HAMMER TOE REPAIR 2ND LT - 28285  [facility] MEDICARE EFFECTIVE DATE - 09/27/19  PLAN DEDUCTIBLE - $0.00 OUT OF NETWORK - $3600 W/$3189.88 REMAINING  COPAY $295 / Day OUTPATIENT SURGERY $295 / Day OUTPATIENT HOSPITAL CO-INSURANCE 0% / Day OUTPATIENT SURGERY 0% / [place]  CASE DETAILS NOTIFICATION/PRIOR AUTHORIZATION NUMBER CASE STATUS CASE STATUS REASON PRIMARY CARE PHYSICIAN [id number] Closed Case Was Managed And Is Now Complete Suann Larry ADVANCE NOTIFY DATE/TIME ADMISSION NOTIFY DATE/TIME 12/19/2019 09:26 AM CDT - COVERAGE STATUS OVERALL COVERAGE STATUS Covered/Approved 1-2 CODE DESCRIPTION COVERAGE STATUS DECISION DATE FAC  Spec Surg Coverage determination is reflected for the facility admission and is not a guarantee of payment for ongoing services. Covered/Approved 12/25/2019 1 28270 Capsulotomy; metatarsophalangeal joint, more Covered/Approved 12/25/2019 2 28285 Correction, hammertoe (eg, interphalange more Covered/Approved 12/25/2019

## 2019-12-26 ENCOUNTER — Telehealth (INDEPENDENT_AMBULATORY_CARE_PROVIDER_SITE_OTHER): Payer: Medicare Other | Admitting: Internal Medicine

## 2019-12-26 ENCOUNTER — Encounter: Payer: Self-pay | Admitting: Internal Medicine

## 2019-12-26 DIAGNOSIS — I4891 Unspecified atrial fibrillation: Secondary | ICD-10-CM

## 2019-12-26 DIAGNOSIS — K219 Gastro-esophageal reflux disease without esophagitis: Secondary | ICD-10-CM

## 2019-12-26 DIAGNOSIS — E7800 Pure hypercholesterolemia, unspecified: Secondary | ICD-10-CM | POA: Diagnosis not present

## 2019-12-26 DIAGNOSIS — Z85850 Personal history of malignant neoplasm of thyroid: Secondary | ICD-10-CM

## 2019-12-26 DIAGNOSIS — I10 Essential (primary) hypertension: Secondary | ICD-10-CM | POA: Diagnosis not present

## 2019-12-26 DIAGNOSIS — R739 Hyperglycemia, unspecified: Secondary | ICD-10-CM

## 2019-12-26 NOTE — Progress Notes (Signed)
Patient ID: Kerri Mills, female   DOB: 1931/06/23, 86 y.o.   MRN: [id number]   Virtual Visit via telephone Note  This visit type was conducted due to national recommendations for restrictions regarding the COVID-19 pandemic (e.g. social distancing).  This format is felt to be most appropriate for this patient at this time.  All issues noted in this document were discussed and addressed.  No physical exam was performed (except for noted visual exam findings with Video Visits).   I connected with Kerri Mills by telephone and verified that I am speaking with the correct person using two identifiers. Location patient: home Location provider: work  Persons participating in the telephone visit: patient, provider  I discussed the limitations, risks, security and privacy concerns of performing an evaluation and management service by telephone and the availability of in person appointments.The patient expressed understanding and agreed to proceed.   Reason for visit:  Scheduled follow up.   HPI: She reports she is doing relatively well.  Has been evaluated for left hip pain recently.  Diagnosed with trochanteric bursitis.  Finished therapy.  S/p injection.  Helped.  States can feel some now, but overall not limiting her activity.  Breathing stable.  Went to ER in 09/2019.  See previous notes.  Saw Ignacia Bayley.  No further cardiac w/up felt warranted.  Has not had pain since.  No acid reflux reported.  No abdominal pain.  Bowels stable.  States blood pressure is doing well.  Had previously discussed with her - taking '20mg'$  lisinopril.  She has only been taking '10mg'$ .  Blood pressure 109-135/70s.  Just got rx for lisinopril '20mg'$ .  Plans to take 1/2 tablet q day and monitor blood pressure.     ROS: See pertinent positives and negatives per HPI.  Past Medical History:  Diagnosis Date  . Allergy   . Anxiety   . Arthritis   . Atypical chest pain    a. 10/2018 MV: small, fixed apical defect possibly 2/2  attenuation artifact. No ischemia.  EF 68%.  . Clotting disorder ([place])   . Colitis   . Depression   . Diverticulitis 2013  . Gastric ulcer   . GERD (gastroesophageal reflux disease)   . History of echocardiogram    a. 09/2019 Echo: EF 55-60%, mod LVH. Mildly dil LA. Triv MR/TR.  Marland Kitchen Hypercholesterolemia   . Hypertension   . Infiltrating lobular carcinoma of left breast 2011   T2,N0, ER: 90%; PR 0%; Her 2 neu not amplified. [facility]).  . Melanoma ([place]) 1997  . Melanoma in situ of upper extremity ([place]) 03/19/2011  . Persistent atrial fibrillation ([place])    a. CHADS2VASc => 4 (HTN, age x 2, female)  . Personal history of radiation therapy 2011   BREAST CA  . Seroma    HISTORY OF LFT BREAST  . Sleep apnea   . Thyroid cancer ([place]) 1992    Past Surgical History:  Procedure Laterality Date  . ABDOMINAL HYSTERECTOMY  1973   partial  . BREAST BIOPSY Left 02-13-13   BENIGN BREAST TISSUE WITH CHANGES CONSISTENT WITH FAT NECROSIS  . BREAST BIOPSY Left 01/21/2015   bx done in [facility] office 11:00 left 6-8cmfn  . BREAST EXCISIONAL BIOPSY Left 1995   neg  . BREAST EXCISIONAL BIOPSY Left 2011   Breast cancer radiation  . BREAST LUMPECTOMY Left 2011   BREAST CA  . CARDIAC CATHETERIZATION    . CHOLECYSTECTOMY    . COLONOSCOPY  2013  .  MELANOMA EXCISION     RT UPPER ARM  . PARTIAL HYSTERECTOMY     bleeding, ovaries in place.    . THYROID SURGERY  1992   FOR THYROID CANCER  . TONSILLECTOMY      Family History  Problem Relation Age of Onset  . Heart disease Mother   . Cancer Brother        lung   . Cancer Sister        breast  . Breast cancer Neg Hx     SOCIAL HX: reviewed.    Current Outpatient Medications:  .  albuterol (PROAIR HFA) 108 (90 Base) MCG/ACT inhaler, Inhale 2 puffs into the lungs every 6 (six) hours as needed for wheezing or shortness of breath., Disp: 1 Inhaler, Rfl: 3 .  Cholecalciferol (VITAMIN D) 1000 UNITS capsule, Take 1,000 Units by mouth daily.   , Disp: , Rfl:  .  DULoxetine (CYMBALTA) 60 MG capsule, TAKE 1 CAPSULE (60 MG TOTAL) BY MOUTH AT BEDTIME., Disp: 90 capsule, Rfl: 1 .  ELIQUIS 5 MG TABS tablet, TAKE 1 TABLET BY MOUTH TWICE A DAY, Disp: 180 tablet, Rfl: 1 .  furosemide (LASIX) 20 MG tablet, TAKE 1 TABLET (20 MG TOTAL) BY MOUTH AS DIRECTED. TAKE 1 TABLET (20 MG) TWICE A WEEK WITH POTASSIUM, Disp: 24 tablet, Rfl: 3 .  gabapentin (NEURONTIN) 300 MG capsule, TAKE 2 CAPSULES (600 MG TOTAL) BY MOUTH AT BEDTIME., Disp: 180 capsule, Rfl: 1 .  levothyroxine (SYNTHROID) 88 MCG tablet, Take 1 tablet (88 mcg total) by mouth daily before breakfast. TAKE ONE TABLET BY MOUTH ONCE DAILY BEFORE BREAKFAST, Disp: , Rfl:  .  lisinopril (ZESTRIL) 20 MG tablet, TAKE 1 TABLET BY MOUTH EVERY DAY (Patient taking differently: 10 mg. ), Disp: 30 tablet, Rfl: 5 .  lovastatin (MEVACOR) 40 MG tablet, Take 1 tablet (40 mg total) by mouth at bedtime., Disp: , Rfl:  .  metoprolol succinate (TOPROL XL) 25 MG 24 hr tablet, Take 0.5 tablets (12.5 mg total) by mouth daily. This is the last dose of Toprol you were on., Disp: 15 tablet, Rfl: 0 .  Multiple Vitamins-Minerals (PRESERVISION AREDS 2) CAPS, Take 1 tablet by mouth 2 (two) times daily. , Disp: , Rfl:  .  omeprazole (PRILOSEC) 20 MG capsule, TAKE 1 CAPSULE BY MOUTH EVERY DAY, Disp: 90 capsule, Rfl: 3 .  potassium chloride (KLOR-CON) 10 MEQ tablet, TAKE 2 TABLETS (20 MEQ TOTAL) BY MOUTH AS DIRECTED. TAKE 2 TABLETS (20 MEQ) TWICE A WEEK WITH LASIX, Disp: 36 tablet, Rfl: 0  Current Facility-Administered Medications:  .  albuterol (PROVENTIL) (2.5 MG/3ML) 0.083% nebulizer solution 2.5 mg, 2.5 mg, Nebulization, Once, Guse, Jacquelynn Cree, FNP  EXAM:  VITALS per patient if applicable: [id number]  GENERAL: alert.  Sounds to be in no acute distress.  Answering questions appropriately.    PSYCH/NEURO: pleasant and cooperative, no obvious depression or anxiety, speech and thought processing grossly intact  ASSESSMENT  AND PLAN:  Discussed the following assessment and plan:  Atrial fibrillation ([place]) On eliquis.  Doing well.  No increased heart rate or palpitations.  Follow.   Essential hypertension Doing well on the 30m of lisinopril.  Just got rx for 226m  Will take 1/2 tablet and monitor blood pressure.  Follow pressures.  Follow metabolic panel.  Send in readings.   GERD Controlled on omeprazole.    History of thyroid cancer S/p XRT.  On thyroid replacement.  Follow tsh.   Hypercholesterolemia On lovastatin.  Low cholesterol  diet and exercise.  Follow lipid panel and liver function tests.   Hyperglycemia Low carb diet and exercise.  Follow met b and a1c.     Orders Placed This Encounter  Procedures  . Comprehensive metabolic panel    Standing Status:   Future    Standing Expiration Date:   12/28/2020  . Lipid panel    Standing Status:   Future    Standing Expiration Date:   12/28/2020  . TSH    Standing Status:   Future    Standing Expiration Date:   12/28/2020    No orders of the defined types were placed in this encounter.    I discussed the assessment and treatment plan with the patient. The patient was provided an opportunity to ask questions and all were answered. The patient agreed with the plan and demonstrated an understanding of the instructions.   The patient was advised to call back or seek an in-person evaluation if the symptoms worsen or if the condition fails to improve as anticipated.  I provided 22 minutes of non-face-to-face time during this encounter.   Einar Pheasant, MD

## 2019-12-29 ENCOUNTER — Encounter: Payer: Self-pay | Admitting: Internal Medicine

## 2019-12-29 NOTE — Assessment & Plan Note (Signed)
Doing well on the 10mg  of lisinopril.  Just got rx for 20mg .  Will take 1/2 tablet and monitor blood pressure.  Follow pressures.  Follow metabolic panel.  Send in readings.

## 2019-12-29 NOTE — Assessment & Plan Note (Signed)
On eliquis.  Doing well.  No increased heart rate or palpitations.  Follow.

## 2019-12-29 NOTE — Assessment & Plan Note (Signed)
Low carb diet and exercise.  Follow met b and a1c.   

## 2019-12-29 NOTE — Assessment & Plan Note (Signed)
S/p XRT.  On thyroid replacement.  Follow tsh.

## 2019-12-29 NOTE — Assessment & Plan Note (Signed)
Controlled on omeprazole.   

## 2019-12-29 NOTE — Assessment & Plan Note (Signed)
On lovastatin.  Low cholesterol diet and exercise.  Follow lipid panel and liver function tests.   

## 2020-01-08 ENCOUNTER — Encounter: Payer: Self-pay | Admitting: Cardiovascular Disease

## 2020-01-08 ENCOUNTER — Other Ambulatory Visit: Payer: Self-pay

## 2020-01-08 ENCOUNTER — Emergency Department: Payer: Medicare Other

## 2020-01-08 ENCOUNTER — Inpatient Hospital Stay
Admission: EM | Admit: 2020-01-08 | Discharge: 2020-01-10 | DRG: 309 | Disposition: A | Discharge status: Other Acute Inpatient Hospital | Payer: Medicare Other | Source: Skilled Nursing Facility | Attending: Hospitalist | Admitting: Hospitalist

## 2020-01-08 ENCOUNTER — Telehealth: Payer: Self-pay

## 2020-01-08 DIAGNOSIS — Z85820 Personal history of malignant melanoma of skin: Secondary | ICD-10-CM | POA: Diagnosis not present

## 2020-01-08 DIAGNOSIS — Z7901 Long term (current) use of anticoagulants: Secondary | ICD-10-CM | POA: Diagnosis not present

## 2020-01-08 DIAGNOSIS — Z923 Personal history of irradiation: Secondary | ICD-10-CM | POA: Diagnosis not present

## 2020-01-08 DIAGNOSIS — Z888 Allergy status to other drugs, medicaments and biological substances status: Secondary | ICD-10-CM | POA: Diagnosis not present

## 2020-01-08 DIAGNOSIS — I358 Other nonrheumatic aortic valve disorders: Secondary | ICD-10-CM | POA: Diagnosis present

## 2020-01-08 DIAGNOSIS — Z803 Family history of malignant neoplasm of breast: Secondary | ICD-10-CM | POA: Diagnosis not present

## 2020-01-08 DIAGNOSIS — S0512XA Contusion of eyeball and orbital tissues, left eye, initial encounter: Secondary | ICD-10-CM | POA: Diagnosis not present

## 2020-01-08 DIAGNOSIS — I459 Conduction disorder, unspecified: Secondary | ICD-10-CM | POA: Diagnosis not present

## 2020-01-08 DIAGNOSIS — S0990XA Unspecified injury of head, initial encounter: Secondary | ICD-10-CM | POA: Diagnosis not present

## 2020-01-08 DIAGNOSIS — I480 Paroxysmal atrial fibrillation: Secondary | ICD-10-CM | POA: Diagnosis not present

## 2020-01-08 DIAGNOSIS — Z853 Personal history of malignant neoplasm of breast: Secondary | ICD-10-CM

## 2020-01-08 DIAGNOSIS — I10 Essential (primary) hypertension: Secondary | ICD-10-CM | POA: Diagnosis present

## 2020-01-08 DIAGNOSIS — I495 Sick sinus syndrome: Secondary | ICD-10-CM | POA: Diagnosis not present

## 2020-01-08 DIAGNOSIS — M542 Cervicalgia: Secondary | ICD-10-CM | POA: Diagnosis not present

## 2020-01-08 DIAGNOSIS — Y92128 Other place in nursing home as the place of occurrence of the external cause: Secondary | ICD-10-CM

## 2020-01-08 DIAGNOSIS — Z9049 Acquired absence of other specified parts of digestive tract: Secondary | ICD-10-CM

## 2020-01-08 DIAGNOSIS — W19XXXA Unspecified fall, initial encounter: Secondary | ICD-10-CM

## 2020-01-08 DIAGNOSIS — S8992XA Unspecified injury of left lower leg, initial encounter: Secondary | ICD-10-CM | POA: Diagnosis not present

## 2020-01-08 DIAGNOSIS — Z801 Family history of malignant neoplasm of trachea, bronchus and lung: Secondary | ICD-10-CM

## 2020-01-08 DIAGNOSIS — E7800 Pure hypercholesterolemia, unspecified: Secondary | ICD-10-CM | POA: Diagnosis present

## 2020-01-08 DIAGNOSIS — E039 Hypothyroidism, unspecified: Secondary | ICD-10-CM | POA: Diagnosis not present

## 2020-01-08 DIAGNOSIS — K219 Gastro-esophageal reflux disease without esophagitis: Secondary | ICD-10-CM | POA: Diagnosis present

## 2020-01-08 DIAGNOSIS — Z79899 Other long term (current) drug therapy: Secondary | ICD-10-CM

## 2020-01-08 DIAGNOSIS — Z85850 Personal history of malignant neoplasm of thyroid: Secondary | ICD-10-CM

## 2020-01-08 DIAGNOSIS — R42 Dizziness and giddiness: Secondary | ICD-10-CM | POA: Diagnosis not present

## 2020-01-08 DIAGNOSIS — G4733 Obstructive sleep apnea (adult) (pediatric): Secondary | ICD-10-CM | POA: Diagnosis not present

## 2020-01-08 DIAGNOSIS — R55 Syncope and collapse: Secondary | ICD-10-CM | POA: Diagnosis not present

## 2020-01-08 DIAGNOSIS — I447 Left bundle-branch block, unspecified: Secondary | ICD-10-CM | POA: Diagnosis present

## 2020-01-08 DIAGNOSIS — Z8711 Personal history of peptic ulcer disease: Secondary | ICD-10-CM | POA: Diagnosis not present

## 2020-01-08 DIAGNOSIS — F329 Major depressive disorder, single episode, unspecified: Secondary | ICD-10-CM | POA: Diagnosis present

## 2020-01-08 DIAGNOSIS — I471 Supraventricular tachycardia: Secondary | ICD-10-CM | POA: Diagnosis not present

## 2020-01-08 DIAGNOSIS — R52 Pain, unspecified: Secondary | ICD-10-CM | POA: Diagnosis not present

## 2020-01-08 DIAGNOSIS — Z20822 Contact with and (suspected) exposure to covid-19: Secondary | ICD-10-CM | POA: Diagnosis not present

## 2020-01-08 DIAGNOSIS — R001 Bradycardia, unspecified: Secondary | ICD-10-CM | POA: Diagnosis not present

## 2020-01-08 DIAGNOSIS — Z743 Need for continuous supervision: Secondary | ICD-10-CM | POA: Diagnosis not present

## 2020-01-08 DIAGNOSIS — E785 Hyperlipidemia, unspecified: Secondary | ICD-10-CM | POA: Diagnosis present

## 2020-01-08 DIAGNOSIS — Y9301 Activity, walking, marching and hiking: Secondary | ICD-10-CM | POA: Diagnosis present

## 2020-01-08 DIAGNOSIS — I4819 Other persistent atrial fibrillation: Secondary | ICD-10-CM | POA: Diagnosis not present

## 2020-01-08 DIAGNOSIS — Z8249 Family history of ischemic heart disease and other diseases of the circulatory system: Secondary | ICD-10-CM

## 2020-01-08 DIAGNOSIS — Z880 Allergy status to penicillin: Secondary | ICD-10-CM | POA: Diagnosis not present

## 2020-01-08 DIAGNOSIS — Z90711 Acquired absence of uterus with remaining cervical stump: Secondary | ICD-10-CM

## 2020-01-08 DIAGNOSIS — Z79890 Hormone replacement therapy (postmenopausal): Secondary | ICD-10-CM

## 2020-01-08 DIAGNOSIS — W1830XA Fall on same level, unspecified, initial encounter: Secondary | ICD-10-CM | POA: Diagnosis present

## 2020-01-08 DIAGNOSIS — R0602 Shortness of breath: Secondary | ICD-10-CM | POA: Diagnosis not present

## 2020-01-08 DIAGNOSIS — Z86718 Personal history of other venous thrombosis and embolism: Secondary | ICD-10-CM

## 2020-01-08 DIAGNOSIS — R4020 Unspecified coma: Secondary | ICD-10-CM

## 2020-01-08 LAB — URINALYSIS, COMPLETE (UACMP) WITH MICROSCOPIC
Bacteria, UA: NONE SEEN
Bilirubin Urine: NEGATIVE
Glucose, UA: NEGATIVE mg/dL
Hgb urine dipstick: NEGATIVE
Ketones, ur: NEGATIVE mg/dL
Leukocytes,Ua: NEGATIVE
Nitrite: NEGATIVE
Protein, ur: NEGATIVE mg/dL
Specific Gravity, Urine: 1.015 (ref 1.005–1.030)
pH: 6 (ref 5.0–8.0)

## 2020-01-08 LAB — CBC WITH DIFFERENTIAL/PLATELET
Abs Immature Granulocytes: 0.03 10*3/uL (ref 0.00–0.07)
Basophils Absolute: 0.1 10*3/uL (ref 0.0–0.1)
Basophils Relative: 1 %
Eosinophils Absolute: 0.1 10*3/uL (ref 0.0–0.5)
Eosinophils Relative: 2 %
HCT: 39.8 % (ref 36.0–46.0)
Hemoglobin: 12.7 g/dL (ref 12.0–15.0)
Immature Granulocytes: 1 %
Lymphocytes Relative: 23 %
Lymphs Abs: 1.4 10*3/uL (ref 0.7–4.0)
MCH: 30.1 pg (ref 26.0–34.0)
MCHC: 31.9 g/dL (ref 30.0–36.0)
MCV: 94.3 fL (ref 80.0–100.0)
Monocytes Absolute: 0.5 10*3/uL (ref 0.1–1.0)
Monocytes Relative: 9 %
Neutro Abs: 4 10*3/uL (ref 1.7–7.7)
Neutrophils Relative %: 64 %
Platelets: 206 10*3/uL (ref 150–400)
RBC: 4.22 MIL/uL (ref 3.87–5.11)
RDW: 12.5 % (ref 11.5–15.5)
WBC: 6 10*3/uL (ref 4.0–10.5)
nRBC: 0 % (ref 0.0–0.2)

## 2020-01-08 LAB — TROPONIN I (HIGH SENSITIVITY)
Troponin I (High Sensitivity): 7 ng/L (ref <18)
Troponin I (High Sensitivity): 10 ng/L (ref <18)

## 2020-01-08 LAB — COMPREHENSIVE METABOLIC PANEL
ALT: 15 U/L (ref 0–44)
AST: 20 U/L (ref 15–41)
Albumin: 3.8 g/dL (ref 3.5–5.0)
Alkaline Phosphatase: 111 U/L (ref 38–126)
Anion gap: 9 (ref 5–15)
BUN: 16 mg/dL (ref 8–23)
CO2: 26 mmol/L (ref 22–32)
Calcium: 9.7 mg/dL (ref 8.9–10.3)
Chloride: 105 mmol/L (ref 98–111)
Creatinine, Ser: 0.97 mg/dL (ref 0.44–1.00)
GFR calc Af Amer: >60 mL/min (ref >60)
GFR calc non Af Amer: 53 mL/min — ABNORMAL LOW (ref >60)
Glucose, Bld: 160 mg/dL — ABNORMAL HIGH (ref 70–99)
Potassium: 4.2 mmol/L (ref 3.5–5.1)
Sodium: 140 mmol/L (ref 135–145)
Total Bilirubin: 0.9 mg/dL (ref 0.3–1.2)
Total Protein: 6.6 g/dL (ref 6.5–8.1)

## 2020-01-08 LAB — TSH: TSH: 1.439 u[IU]/mL (ref 0.350–4.500)

## 2020-01-08 LAB — MAGNESIUM: Magnesium: 2 mg/dL (ref 1.7–2.4)

## 2020-01-08 LAB — RESPIRATORY PANEL BY RT PCR (FLU A&B, COVID)
Influenza A by PCR: NEGATIVE
Influenza B by PCR: NEGATIVE
SARS Coronavirus 2 by RT PCR: NEGATIVE

## 2020-01-08 MED ORDER — APIXABAN 5 MG PO TABS
5.0000 mg | ORAL_TABLET | Freq: Two times a day (BID) | ORAL | Status: DC
  Administered 2020-01-08 – 2020-01-09 (×3): 5 mg via ORAL
  Filled 2020-01-08 (×3): qty 1

## 2020-01-08 MED ORDER — DULOXETINE HCL 60 MG PO CPEP
60.0000 mg | ORAL_CAPSULE | Freq: Every day | ORAL | Status: DC
  Administered 2020-01-08 – 2020-01-09 (×2): 60 mg via ORAL
  Filled 2020-01-08 (×3): qty 1

## 2020-01-08 MED ORDER — PRAVASTATIN SODIUM 20 MG PO TABS
40.0000 mg | ORAL_TABLET | Freq: Every day | ORAL | Status: DC
  Administered 2020-01-09: 17:00:00 40 mg via ORAL
  Filled 2020-01-08: qty 2

## 2020-01-08 MED ORDER — GABAPENTIN 300 MG PO CAPS
600.0000 mg | ORAL_CAPSULE | Freq: Every day | ORAL | Status: DC
  Administered 2020-01-08 – 2020-01-09 (×2): 600 mg via ORAL
  Filled 2020-01-08 (×2): qty 2

## 2020-01-08 MED ORDER — LISINOPRIL 10 MG PO TABS
10.0000 mg | ORAL_TABLET | Freq: Every day | ORAL | Status: DC
  Administered 2020-01-09 – 2020-01-10 (×2): 10 mg via ORAL
  Filled 2020-01-08 (×2): qty 1

## 2020-01-08 MED ORDER — LEVOTHYROXINE SODIUM 88 MCG PO TABS
88.0000 ug | ORAL_TABLET | Freq: Every day | ORAL | Status: DC
  Administered 2020-01-09 – 2020-01-10 (×2): 88 ug via ORAL
  Filled 2020-01-08 (×2): qty 1

## 2020-01-08 MED ORDER — PANTOPRAZOLE SODIUM 40 MG PO TBEC
40.0000 mg | DELAYED_RELEASE_TABLET | Freq: Every day | ORAL | Status: DC
  Administered 2020-01-09 – 2020-01-10 (×2): 40 mg via ORAL
  Filled 2020-01-08 (×3): qty 1

## 2020-01-08 MED ORDER — IBUPROFEN 400 MG PO TABS: 400.0000 mg | ORAL_TABLET | Freq: Four times a day (QID) | ORAL | Status: DC | PRN

## 2020-01-08 MED ORDER — ACETAMINOPHEN 500 MG PO TABS
1000.0000 mg | ORAL_TABLET | Freq: Once | ORAL | Status: AC
  Administered 2020-01-08: 18:00:00 1000 mg via ORAL
  Filled 2020-01-08: qty 2

## 2020-01-08 NOTE — H&P (Signed)
History and Physical    Kerri Mills [id number] DOB: Jan 29, 1931 DOA: 01/08/2020  PCP: Einar Pheasant, MD  Patient coming from: [facility]   I have personally briefly reviewed patient's old medical records in [place]  Chief Complaint: syncope  HPI: Kerri Mills is a 86 y.o. Caucasian female with medical history significant of Afib on Eliquis, hypothyroidism, HTN who presented from [facility] after an syncopal event.   Pt reported that while walking across the dining room in her facility, she felt faint, and then next thing she knew, she woke up on the ground feeling confused.  Prior to the fall, pt had been very active in the morning, and was at her baseline health, no recent illness.  No heart palpitation or chest pain.  Pt said her cardiologist had put her back on Toprol 12.5 mg once daily recently which she took this morning around 10 am.  Pt reported hx of frequent PVC's but she couldn't feel them.  Pt complained of leg pain and headache on admission.  No fever, dyspnea, cough, abdominal pain, N/V/D, dysuria.  ED Course: Upon arrival, EMS found pt's HR in the 30's.  Pt reported facility check her BG which was 150's.  Initial vitals in the ED: afebrile pulse 58, BP 113/68, sating 100% on room air.  CBC and CMP unremarkable.  Trop neg x2.  EKG showed LBBB, which is new since Feb 2021.  CT of C-spine and maxillofacial showed no acute fractures.  Pt was admitted as observation.   Assessment/Plan Active Problems:   Syncope  # Syncope likely due to cardiac arrhythmia  --EMS found pt's HR in 30's on initial arrival.  EKG in the ED showed new LBBB with rate of 50's.  Pt took Toprol 12.5 mg daily this morning.   PLAN: --Hold home Toprol --Monitor on tele --Cardiology consult in the morning  # Hx of Afib on Eliquis --Hold home Toprol due to bradycardia --continue home Eliquis  # HTN --continue home Lisinopril 10 mg daily  # Hypothyroidism --check TSH --continue home  Synthroid  # HLD --continue statin as pravastatin  # GERD --continue PPI  # Depression --continue home Cymbalta   DVT prophylaxis: [id number] Code Status: Full code  Family Communication:   Disposition Plan: Back to [facility]  Consults called:  Admission status: Observation   Review of Systems: As per HPI otherwise 10 point review of systems negative.   Past Medical History:  Diagnosis Date  . Allergy   . Anxiety   . Arthritis   . Atypical chest pain    a. 10/2018 MV: small, fixed apical defect possibly 2/2 attenuation artifact. No ischemia.  EF 68%.  . Clotting disorder ([place])   . Colitis   . Depression   . Diverticulitis 2013  . Gastric ulcer   . GERD (gastroesophageal reflux disease)   . History of echocardiogram    a. 09/2019 Echo: EF 55-60%, mod LVH. Mildly dil LA. Triv MR/TR.  Marland Kitchen Hypercholesterolemia   . Hypertension   . Infiltrating lobular carcinoma of left breast 2011   T2,N0, ER: 90%; PR 0%; Her 2 neu not amplified. [facility]).  . Melanoma ([place]) 1997  . Melanoma in situ of upper extremity ([place]) 03/19/2011  . Persistent atrial fibrillation ([place])    a. CHADS2VASc => 4 (HTN, age x 2, female)  . Personal history of radiation therapy 2011   BREAST CA  . Seroma    HISTORY OF LFT BREAST  .  Sleep apnea   . Thyroid cancer ([place]) 1992    Past Surgical History:  Procedure Laterality Date  . ABDOMINAL HYSTERECTOMY  1973   partial  . BREAST BIOPSY Left 02-13-13   BENIGN BREAST TISSUE WITH CHANGES CONSISTENT WITH FAT NECROSIS  . BREAST BIOPSY Left 01/21/2015   bx done in [facility] office 11:00 left 6-8cmfn  . BREAST EXCISIONAL BIOPSY Left 1995   neg  . BREAST EXCISIONAL BIOPSY Left 2011   Breast cancer radiation  . BREAST LUMPECTOMY Left 2011   BREAST CA  . CARDIAC CATHETERIZATION    . CHOLECYSTECTOMY    . COLONOSCOPY  2013  . MELANOMA EXCISION     RT UPPER ARM  . PARTIAL HYSTERECTOMY     bleeding, ovaries in place.    . THYROID SURGERY  1992     FOR THYROID CANCER  . TONSILLECTOMY       reports that she has never smoked. She has never used smokeless tobacco. She reports current alcohol use. She reports that she does not use drugs.  Allergies  Allergen Reactions  . Atorvastatin     Other reaction(s): Other (See Comments) STIFFNESS AND SORE STIFFNESS AND SORE  . Lipitor [Atorvastatin Calcium] Other (See Comments)    Stiffness & soreness  . Penicillins Rash    REACTION: Unknown reaction    Family History  Problem Relation Age of Onset  . Heart disease Mother   . Cancer Brother        lung   . Cancer Sister        breast  . Breast cancer Neg Hx      Prior to Admission medications   Medication Sig Start Date End Date Taking? Authorizing Provider  albuterol (PROAIR HFA) 108 (90 Base) MCG/ACT inhaler Inhale 2 puffs into the lungs every 6 (six) hours as needed for wheezing or shortness of breath. 08/10/18  Yes Einar Pheasant, MD  Cholecalciferol (VITAMIN D) 1000 UNITS capsule Take 1,000 Units by mouth daily.     Yes [provider]  DULoxetine (CYMBALTA) 60 MG capsule TAKE 1 CAPSULE (60 MG TOTAL) BY MOUTH AT BEDTIME. 09/11/19  Yes Scott, Randell Patient, MD  ELIQUIS 5 MG TABS tablet TAKE 1 TABLET BY MOUTH TWICE A DAY Patient taking differently: Take 5 mg by mouth 2 (two) times daily.  10/30/19  Yes Gollan, Kathlene November, MD  furosemide (LASIX) 20 MG tablet TAKE 1 TABLET (20 MG TOTAL) BY MOUTH AS DIRECTED. TAKE 1 TABLET (20 MG) TWICE A WEEK WITH POTASSIUM 09/13/19 01/08/20 Yes Gollan, Kathlene November, MD  gabapentin (NEURONTIN) 300 MG capsule TAKE 2 CAPSULES (600 MG TOTAL) BY MOUTH AT BEDTIME. Patient taking differently: Take 600 mg by mouth at bedtime.  09/17/19  Yes Einar Pheasant, MD  levothyroxine (SYNTHROID) 88 MCG tablet Take 1 tablet (88 mcg total) by mouth daily before breakfast. TAKE ONE TABLET BY MOUTH ONCE DAILY BEFORE BREAKFAST Patient taking differently: Take 88 mcg by mouth daily before breakfast.  10/21/19  Yes Enzo Bi, MD  lisinopril (ZESTRIL) 20 MG tablet TAKE 1 TABLET BY MOUTH EVERY DAY Patient taking differently: Take 10 mg by mouth daily.  03/22/19  Yes Einar Pheasant, MD  lovastatin (MEVACOR) 40 MG tablet Take 1 tablet (40 mg total) by mouth at bedtime. 10/21/19  Yes Enzo Bi, MD  metoprolol succinate (TOPROL-XL) 25 MG 24 hr tablet Take 12.5 mg by mouth daily.   Yes [provider]  Multiple Vitamins-Minerals (PRESERVISION AREDS 2) CAPS Take 1 tablet by  mouth 2 (two) times daily.    Yes [provider]  omeprazole (PRILOSEC) 20 MG capsule TAKE 1 CAPSULE BY MOUTH EVERY DAY Patient taking differently: Take 20 mg by mouth daily.  12/09/19  Yes Einar Pheasant, MD  potassium chloride (KLOR-CON) 10 MEQ tablet TAKE 2 TABLETS (20 MEQ TOTAL) BY MOUTH AS DIRECTED. TAKE 2 TABLETS (20 MEQ) TWICE A WEEK WITH LASIX 12/09/19  Yes Minna Merritts, MD    Physical Exam: Vitals:   01/08/20 1426 01/08/20 1430 01/08/20 1700 01/08/20 1901  BP: 113/68 105/88 102/86 131/85  Pulse:   72 69  Resp:  13 20 18   Temp:    98.2 F (36.8 C)  TempSrc:    Oral  SpO2:   100% 98%  Weight:      Height:        Constitutional: NAD, AAOx3 HEENT: conjunctivae and lids normal, EOMI CV: RRR no M,R,G. Distal pulses +2.  No cyanosis.   RESP: CTA B/L, normal respiratory effort  GI: +BS, NTND Extremities: No effusions, edema in BLE SKIN: warm, dry and intact Neuro: II - XII grossly intact.  Sensation intact Psych: Normal mood and affect.  Appropriate judgement and reason   Labs on Admission: I have personally reviewed following labs and imaging studies  CBC: Recent Labs  Lab 01/08/20 1430  WBC 6.0  NEUTROABS 4.0  HGB 12.7  HCT 39.8  MCV 94.3  PLT 99991111   Basic Metabolic Panel: Recent Labs  Lab 01/08/20 1430  NA 140  K 4.2  CL 105  CO2 26  GLUCOSE 160*  BUN 16  CREATININE 0.97  CALCIUM 9.7  MG 2.0   GFR: Estimated Creatinine Clearance: 43.9 mL/min (by C-G formula based on SCr of 0.97  mg/dL). Liver Function Tests: Recent Labs  Lab 01/08/20 1430  AST 20  ALT 15  ALKPHOS 111  BILITOT 0.9  PROT 6.6  ALBUMIN 3.8   No results for input(s): LIPASE, AMYLASE in the last 168 hours. No results for input(s): AMMONIA in the last 168 hours. Coagulation Profile: No results for input(s): INR, PROTIME in the last 168 hours. Cardiac Enzymes: No results for input(s): CKTOTAL, CKMB, CKMBINDEX, TROPONINI in the last 168 hours. BNP (last 3 results) No results for input(s): PROBNP in the last 8760 hours. HbA1C: No results for input(s): HGBA1C in the last 72 hours. CBG: No results for input(s): GLUCAP in the last 168 hours. Lipid Profile: No results for input(s): CHOL, HDL, LDLCALC, TRIG, CHOLHDL, LDLDIRECT in the last 72 hours. Thyroid Function Tests: No results for input(s): TSH, T4TOTAL, FREET4, T3FREE, THYROIDAB in the last 72 hours. Anemia Panel: No results for input(s): VITAMINB12, FOLATE, FERRITIN, TIBC, IRON, RETICCTPCT in the last 72 hours. Urine analysis:    Component Value Date/Time   COLORURINE YELLOW (A) 01/08/2020 1736   APPEARANCEUR CLEAR (A) 01/08/2020 1736   LABSPEC 1.015 01/08/2020 1736   PHURINE 6.0 01/08/2020 1736   GLUCOSEU NEGATIVE 01/08/2020 1736   HGBUR NEGATIVE 01/08/2020 1736   BILIRUBINUR NEGATIVE 01/08/2020 1736   KETONESUR NEGATIVE 01/08/2020 1736   PROTEINUR NEGATIVE 01/08/2020 1736   NITRITE NEGATIVE 01/08/2020 1736   LEUKOCYTESUR NEGATIVE 01/08/2020 1736    Radiological Exams on Admission: DG Tibia/Fibula Left  Result Date: 01/08/2020 CLINICAL DATA:  [place]. Injured left leg. EXAM: LEFT TIBIA AND FIBULA - 2 VIEW COMPARISON:  None. FINDINGS: The knee and ankle joints are maintained. Moderate degenerative changes are noted at the knee joint. The tibia and fibula are intact. No acute  fracture is identified. IMPRESSION: No acute fracture. Electronically Signed   By: Marijo Sanes M.D.   On: 01/08/2020 15:58   CT Head Wo Contrast  Result  Date: 01/08/2020 CLINICAL DATA:  Fall, bruising beneath the left eye, neck pain with movement EXAM: CT HEAD WITHOUT CONTRAST CT MAXILLOFACIAL WITHOUT CONTRAST CT CERVICAL SPINE WITHOUT CONTRAST TECHNIQUE: Multidetector CT imaging of the head, cervical spine, and maxillofacial structures were performed using the standard protocol without intravenous contrast. Multiplanar CT image reconstructions of the cervical spine and maxillofacial structures were also generated. COMPARISON:  CT head 11/21/2018 FINDINGS: CT HEAD FINDINGS Brain: No evidence of acute infarction, hemorrhage, hydrocephalus, extra-axial collection or mass lesion/mass effect. Symmetric prominence of the ventricles, cisterns and sulci compatible with parenchymal volume loss. Patchy areas of white matter hypoattenuation are most compatible with chronic microvascular angiopathy. Vascular: Atherosclerotic calcification of the carotid siphons. No hyperdense vessel. Skull: No calvarial fracture or suspicious osseous lesion. No scalp swelling or hematoma. Other: None CT MAXILLOFACIAL FINDINGS Osseous: No fracture of the bony orbits. Nasal bones are intact. No mid face fractures are seen. The pterygoid plates are intact. The mandible is intact. Temporomandibular joints are normally aligned. No temporal bone fractures are identified. No fractured or avulsed teeth. Few carious lesions within the maxillary dentition. Orbits: Some mild left infraorbital soft tissue swelling. No retro septal gas, stranding or hemorrhage. Prior bilateral lens extractions. The globes appear normal and symmetric. Symmetric appearance of the extraocular musculature and optic nerve sheath complexes. Normal caliber of the superior ophthalmic veins. Sinuses: Minimal mural thickening in the ethmoid sinuses. Remaining paranasal sinuses are predominantly clear. Mastoid air cells are well aerated. Middle ear cavities are clear. Soft tissues: Minimal soft tissue swelling inferior to the left  orbit, correlate with site of reported injury. No other significant soft tissue swelling. No soft tissue gas or foreign body. CT CERVICAL SPINE FINDINGS Alignment: Stabilization collar is not visualized at the time of exam. There is straightening and focal reversal of the normal lordosis centered at C3. Mild anterolisthesis C2 on C3 is likely on a degenerative basis with notable fusion of the left C2-3 facets. No other significant spondylolisthesis. No traumatic listhesis is evident. No abnormally widened, perched or jumped facets. Skull base and vertebrae: No acute fracture. No primary bone lesion or focal pathologic process. Soft tissues and spinal canal: No pre or paravertebral fluid or swelling. No visible canal hematoma. Disc levels: Severe facet degenerative changes noted at the C1-2 lateral masses. Additional mild-to-moderate multilevel cervical spondylitic changes are present most pronounced C5-C7 at most mild canal stenosis at the C5-6 level. Multilevel uncinate spurring and facet hypertrophic changes result in mild-to-moderate multilevel neural foraminal narrowing without severe foraminal stenosis. Upper chest: No acute abnormality in the upper chest or imaged lung apices. Other: Dense cervical carotid atherosclerosis. Thyroid tissue appears diminutive, correlate for prior ablation or surgery. IMPRESSION: 1. No acute intracranial findings. 2. Mild parenchymal volume loss and chronic white matter angiopathy. 3. Minimal soft tissue swelling inferior to the left orbit, correlate with site of reported injury. No facial bone fracture. 4. Few carious lesions within the maxillary dentition. Correlate with dental exam. 5. No acute fracture or traumatic listhesis. 6. Cervical spondylitic and facet degenerative changes, as detailed above. 7. Dense cervical carotid atherosclerosis. 8. Diminutive appearance of the thyroid gland, correlate for prior surgery and or ablation. Electronically Signed   By: Lovena Le M.D.    On: 01/08/2020 15:44   CT CERVICAL SPINE WO CONTRAST  Result Date:  01/08/2020 CLINICAL DATA:  Fall, bruising beneath the left eye, neck pain with movement EXAM: CT HEAD WITHOUT CONTRAST CT MAXILLOFACIAL WITHOUT CONTRAST CT CERVICAL SPINE WITHOUT CONTRAST TECHNIQUE: Multidetector CT imaging of the head, cervical spine, and maxillofacial structures were performed using the standard protocol without intravenous contrast. Multiplanar CT image reconstructions of the cervical spine and maxillofacial structures were also generated. COMPARISON:  CT head 11/21/2018 FINDINGS: CT HEAD FINDINGS Brain: No evidence of acute infarction, hemorrhage, hydrocephalus, extra-axial collection or mass lesion/mass effect. Symmetric prominence of the ventricles, cisterns and sulci compatible with parenchymal volume loss. Patchy areas of white matter hypoattenuation are most compatible with chronic microvascular angiopathy. Vascular: Atherosclerotic calcification of the carotid siphons. No hyperdense vessel. Skull: No calvarial fracture or suspicious osseous lesion. No scalp swelling or hematoma. Other: None CT MAXILLOFACIAL FINDINGS Osseous: No fracture of the bony orbits. Nasal bones are intact. No mid face fractures are seen. The pterygoid plates are intact. The mandible is intact. Temporomandibular joints are normally aligned. No temporal bone fractures are identified. No fractured or avulsed teeth. Few carious lesions within the maxillary dentition. Orbits: Some mild left infraorbital soft tissue swelling. No retro septal gas, stranding or hemorrhage. Prior bilateral lens extractions. The globes appear normal and symmetric. Symmetric appearance of the extraocular musculature and optic nerve sheath complexes. Normal caliber of the superior ophthalmic veins. Sinuses: Minimal mural thickening in the ethmoid sinuses. Remaining paranasal sinuses are predominantly clear. Mastoid air cells are well aerated. Middle ear cavities are clear.  Soft tissues: Minimal soft tissue swelling inferior to the left orbit, correlate with site of reported injury. No other significant soft tissue swelling. No soft tissue gas or foreign body. CT CERVICAL SPINE FINDINGS Alignment: Stabilization collar is not visualized at the time of exam. There is straightening and focal reversal of the normal lordosis centered at C3. Mild anterolisthesis C2 on C3 is likely on a degenerative basis with notable fusion of the left C2-3 facets. No other significant spondylolisthesis. No traumatic listhesis is evident. No abnormally widened, perched or jumped facets. Skull base and vertebrae: No acute fracture. No primary bone lesion or focal pathologic process. Soft tissues and spinal canal: No pre or paravertebral fluid or swelling. No visible canal hematoma. Disc levels: Severe facet degenerative changes noted at the C1-2 lateral masses. Additional mild-to-moderate multilevel cervical spondylitic changes are present most pronounced C5-C7 at most mild canal stenosis at the C5-6 level. Multilevel uncinate spurring and facet hypertrophic changes result in mild-to-moderate multilevel neural foraminal narrowing without severe foraminal stenosis. Upper chest: No acute abnormality in the upper chest or imaged lung apices. Other: Dense cervical carotid atherosclerosis. Thyroid tissue appears diminutive, correlate for prior ablation or surgery. IMPRESSION: 1. No acute intracranial findings. 2. Mild parenchymal volume loss and chronic white matter angiopathy. 3. Minimal soft tissue swelling inferior to the left orbit, correlate with site of reported injury. No facial bone fracture. 4. Few carious lesions within the maxillary dentition. Correlate with dental exam. 5. No acute fracture or traumatic listhesis. 6. Cervical spondylitic and facet degenerative changes, as detailed above. 7. Dense cervical carotid atherosclerosis. 8. Diminutive appearance of the thyroid gland, correlate for prior  surgery and or ablation. Electronically Signed   By: Lovena Le M.D.   On: 01/08/2020 15:44   DG Chest Port 1 View  Result Date: 01/08/2020 CLINICAL DATA:  Syncope shortness of breath EXAM: PORTABLE CHEST 1 VIEW COMPARISON:  October 20, 2019 FINDINGS: There is mild cardiomegaly. Aortic knob calcifications. The lungs are clear. No  focal airspace consolidation or pleural effusion. No acute osseous abnormality. IMPRESSION: No active disease. Electronically Signed   By: Prudencio Pair M.D.   On: 01/08/2020 16:01   CT MAXILLOFACIAL WO CONTRAST  Result Date: 01/08/2020 CLINICAL DATA:  Fall, bruising beneath the left eye, neck pain with movement EXAM: CT HEAD WITHOUT CONTRAST CT MAXILLOFACIAL WITHOUT CONTRAST CT CERVICAL SPINE WITHOUT CONTRAST TECHNIQUE: Multidetector CT imaging of the head, cervical spine, and maxillofacial structures were performed using the standard protocol without intravenous contrast. Multiplanar CT image reconstructions of the cervical spine and maxillofacial structures were also generated. COMPARISON:  CT head 11/21/2018 FINDINGS: CT HEAD FINDINGS Brain: No evidence of acute infarction, hemorrhage, hydrocephalus, extra-axial collection or mass lesion/mass effect. Symmetric prominence of the ventricles, cisterns and sulci compatible with parenchymal volume loss. Patchy areas of white matter hypoattenuation are most compatible with chronic microvascular angiopathy. Vascular: Atherosclerotic calcification of the carotid siphons. No hyperdense vessel. Skull: No calvarial fracture or suspicious osseous lesion. No scalp swelling or hematoma. Other: None CT MAXILLOFACIAL FINDINGS Osseous: No fracture of the bony orbits. Nasal bones are intact. No mid face fractures are seen. The pterygoid plates are intact. The mandible is intact. Temporomandibular joints are normally aligned. No temporal bone fractures are identified. No fractured or avulsed teeth. Few carious lesions within the maxillary  dentition. Orbits: Some mild left infraorbital soft tissue swelling. No retro septal gas, stranding or hemorrhage. Prior bilateral lens extractions. The globes appear normal and symmetric. Symmetric appearance of the extraocular musculature and optic nerve sheath complexes. Normal caliber of the superior ophthalmic veins. Sinuses: Minimal mural thickening in the ethmoid sinuses. Remaining paranasal sinuses are predominantly clear. Mastoid air cells are well aerated. Middle ear cavities are clear. Soft tissues: Minimal soft tissue swelling inferior to the left orbit, correlate with site of reported injury. No other significant soft tissue swelling. No soft tissue gas or foreign body. CT CERVICAL SPINE FINDINGS Alignment: Stabilization collar is not visualized at the time of exam. There is straightening and focal reversal of the normal lordosis centered at C3. Mild anterolisthesis C2 on C3 is likely on a degenerative basis with notable fusion of the left C2-3 facets. No other significant spondylolisthesis. No traumatic listhesis is evident. No abnormally widened, perched or jumped facets. Skull base and vertebrae: No acute fracture. No primary bone lesion or focal pathologic process. Soft tissues and spinal canal: No pre or paravertebral fluid or swelling. No visible canal hematoma. Disc levels: Severe facet degenerative changes noted at the C1-2 lateral masses. Additional mild-to-moderate multilevel cervical spondylitic changes are present most pronounced C5-C7 at most mild canal stenosis at the C5-6 level. Multilevel uncinate spurring and facet hypertrophic changes result in mild-to-moderate multilevel neural foraminal narrowing without severe foraminal stenosis. Upper chest: No acute abnormality in the upper chest or imaged lung apices. Other: Dense cervical carotid atherosclerosis. Thyroid tissue appears diminutive, correlate for prior ablation or surgery. IMPRESSION: 1. No acute intracranial findings. 2. Mild  parenchymal volume loss and chronic white matter angiopathy. 3. Minimal soft tissue swelling inferior to the left orbit, correlate with site of reported injury. No facial bone fracture. 4. Few carious lesions within the maxillary dentition. Correlate with dental exam. 5. No acute fracture or traumatic listhesis. 6. Cervical spondylitic and facet degenerative changes, as detailed above. 7. Dense cervical carotid atherosclerosis. 8. Diminutive appearance of the thyroid gland, correlate for prior surgery and or ablation. Electronically Signed   By: Lovena Le M.D.   On: 01/08/2020 15:44  Enzo Bi MD [facility]  If 7PM-7AM, please contact night-coverage 01/08/2020, 8:08 PM

## 2020-01-08 NOTE — ED Notes (Signed)
Pt c/o headache, verbal order 1g tylenol dr Joan Mayans

## 2020-01-08 NOTE — ED Notes (Signed)
Dr. Monks at bedside 

## 2020-01-08 NOTE — ED Notes (Signed)
This RN called the floor to let them know the pt was on the way and to call with questions

## 2020-01-08 NOTE — Telephone Encounter (Signed)
This encounter was created in error - please disregard.

## 2020-01-08 NOTE — ED Notes (Signed)
Attempted to call report x2, was told RN would call back

## 2020-01-08 NOTE — ED Provider Notes (Signed)
[facility] Emergency Department Provider Note  ____________________________________________   First MD Initiated Contact with Patient 01/08/20 1431     (approximate)  I have reviewed the triage vital signs and the nursing notes.  History  Chief Complaint Loss of Consciousness    HPI Kerri Mills is a 86 y.o. female with past medical history as below, including atrial fibrillation (on metoprolol and Eliquis), who presents to the emergency department for syncopal episode. She lives at an independent living facility. Patient states she was rushing from her room because she was running late for lunch and went downstairs to the dining center.  She was feeling fine, picked up her lunch, chatted with a few ladies, and was then walking across the dining hall when she began to feel short of breath and lightheaded, and subsequently lost consciousness.  States she must have fallen, because she woke up with some bruising lateral to her left eye as well as some distal left leg pain.  Aside from this discomfort, at present she feels back to her baseline.  She denies any palpitations or chest pain.  She was otherwise feeling well today, however does admit that she did not get a lot of sleep, she was up till around 6 AM because she was worried about a lost check.  She denies any recent illnesses, no vomiting or diarrhea.  Reports compliance with all of her medications including her Eliquis.  Denies any history of loss of consciousness or syncope previously.   Past Medical Hx Past Medical History:  Diagnosis Date  . Allergy   . Anxiety   . Arthritis   . Atypical chest pain    a. 10/2018 MV: small, fixed apical defect possibly 2/2 attenuation artifact. No ischemia.  EF 68%.  . Clotting disorder ([place])   . Colitis   . Depression   . Diverticulitis 2013  . Gastric ulcer   . GERD (gastroesophageal reflux disease)   . History of echocardiogram    a. 09/2019 Echo: EF 55-60%, mod  LVH. Mildly dil LA. Triv MR/TR.  Marland Kitchen Hypercholesterolemia   . Hypertension   . Infiltrating lobular carcinoma of left breast 2011   T2,N0, ER: 90%; PR 0%; Her 2 neu not amplified. [facility]).  . Melanoma ([place]) 1997  . Melanoma in situ of upper extremity ([place]) 03/19/2011  . Persistent atrial fibrillation ([place])    a. CHADS2VASc => 4 (HTN, age x 2, female)  . Personal history of radiation therapy 2011   BREAST CA  . Seroma    HISTORY OF LFT BREAST  . Sleep apnea   . Thyroid cancer ([place]) 1992    Problem List Patient Active Problem List   Diagnosis Date Noted  . Mucopurulent chronic bronchitis ([place]) 11/02/2019  . 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 Left hip pain 06/06/2019  . Encounter for completion of form with patient 03/03/2019  . Abnormal chest CT 12/22/2018  . SOB (shortness of breath) 11/25/2018  . Varicose veins of both lower extremities with inflammation 09/21/2018  . Swelling of lower extremity 08/13/2018  . Right knee pain 05/30/2018  . Fall 05/30/2018  . Sprain of knee 05/30/2018  . Abrasion, knee 05/30/2018  . Osteoarthritis of knee 05/30/2018  . Anemia 02/15/2018  . Bilateral knee pain 11/12/2017  . Atrial fibrillation with rapid ventricular response ([place])   . Demand ischemia ([place])   . Essential hypertension   . Chest pain 09/29/2017  . Dermatitis 09/26/2017  . Osteoarthritis of both knees  06/12/2017  . Osteoporosis, senile 06/12/2017  . Right leg swelling 05/25/2017  . Joint ache 05/25/2017  . Hypercholesterolemia 02/17/2017  . Cough 01/23/2017  . Hyperglycemia 06/18/2016  . Abnormal mammogram of left breast 01/21/2015  . Knee pain, left 11/19/2014  . Diverticulosis of colon without hemorrhage 05/07/2014  . Diverticulosis of large intestine 05/07/2014  . History of breast cancer 01/20/2014  . Stress 06/15/2013  . Obstructive sleep apnea 06/15/2013  . Atrial fibrillation ([place]) 06/15/2013  . Posttraumatic hematoma of left breast 02/19/2013  . Lump or  mass in breast 02/19/2013  . History of thyroid cancer 03/19/2011  . GERD (gastroesophageal reflux disease)   . GERD 05/26/2009    Past Surgical Hx Past Surgical History:  Procedure Laterality Date  . ABDOMINAL HYSTERECTOMY  1973   partial  . BREAST BIOPSY Left 02-13-13   BENIGN BREAST TISSUE WITH CHANGES CONSISTENT WITH FAT NECROSIS  . BREAST BIOPSY Left 01/21/2015   bx done in [facility] office 11:00 left 6-8cmfn  . BREAST EXCISIONAL BIOPSY Left 1995   neg  . BREAST EXCISIONAL BIOPSY Left 2011   Breast cancer radiation  . BREAST LUMPECTOMY Left 2011   BREAST CA  . CARDIAC CATHETERIZATION    . CHOLECYSTECTOMY    . COLONOSCOPY  2013  . MELANOMA EXCISION     RT UPPER ARM  . PARTIAL HYSTERECTOMY     bleeding, ovaries in place.    . THYROID SURGERY  1992   FOR THYROID CANCER  . TONSILLECTOMY      Medications Prior to Admission medications   Medication Sig Start Date End Date Taking? Authorizing Provider  DULoxetine (CYMBALTA) 60 MG capsule TAKE 1 CAPSULE (60 MG TOTAL) BY MOUTH AT BEDTIME. 09/11/19  Yes Scott, Randell Patient, MD  ELIQUIS 5 MG TABS tablet TAKE 1 TABLET BY MOUTH TWICE A DAY 10/30/19  Yes Gollan, Kathlene November, MD  furosemide (LASIX) 20 MG tablet TAKE 1 TABLET (20 MG TOTAL) BY MOUTH AS DIRECTED. TAKE 1 TABLET (20 MG) TWICE A WEEK WITH POTASSIUM 09/13/19 01/08/20 Yes Gollan, Kathlene November, MD  gabapentin (NEURONTIN) 300 MG capsule TAKE 2 CAPSULES (600 MG TOTAL) BY MOUTH AT BEDTIME. 09/17/19  Yes Einar Pheasant, MD  levothyroxine (SYNTHROID) 88 MCG tablet Take 1 tablet (88 mcg total) by mouth daily before breakfast. TAKE ONE TABLET BY MOUTH ONCE DAILY BEFORE BREAKFAST 10/21/19  Yes Enzo Bi, MD  lisinopril (ZESTRIL) 20 MG tablet TAKE 1 TABLET BY MOUTH EVERY DAY Patient taking differently: 10 mg.  03/22/19  Yes Einar Pheasant, MD  lovastatin (MEVACOR) 40 MG tablet Take 1 tablet (40 mg total) by mouth at bedtime. 10/21/19  Yes Enzo Bi, MD  omeprazole (PRILOSEC) 20 MG capsule TAKE 1  CAPSULE BY MOUTH EVERY DAY 12/09/19  Yes Einar Pheasant, MD  potassium chloride (KLOR-CON) 10 MEQ tablet TAKE 2 TABLETS (20 MEQ TOTAL) BY MOUTH AS DIRECTED. TAKE 2 TABLETS (20 MEQ) TWICE A WEEK WITH LASIX 12/09/19  Yes Gollan, Kathlene November, MD  albuterol (PROAIR HFA) 108 (90 Base) MCG/ACT inhaler Inhale 2 puffs into the lungs every 6 (six) hours as needed for wheezing or shortness of breath. 08/10/18   Einar Pheasant, MD  Cholecalciferol (VITAMIN D) 1000 UNITS capsule Take 1,000 Units by mouth daily.      [provider]  Multiple Vitamins-Minerals (PRESERVISION AREDS 2) CAPS Take 1 tablet by mouth 2 (two) times daily.     [provider]    Allergies Atorvastatin, Lipitor [atorvastatin calcium], and Penicillins  Family Hx  Family History  Problem Relation Age of Onset  . Heart disease Mother   . Cancer Brother        lung   . Cancer Sister        breast  . Breast cancer Neg Hx     Social Hx Social History   Tobacco Use  . Smoking status: Never Smoker  . Smokeless tobacco: Never Used  Substance Use Topics  . Alcohol use: Yes    Alcohol/week: 0.0 standard drinks    Comment: social drinking. average times a week  . Drug use: No     Review of Systems  Constitutional: Negative for fever. Negative for chills. Eyes: Negative for visual changes. ENT: Negative for sore throat. Cardiovascular: Negative for chest pain. Respiratory: Positive for shortness of breath. Gastrointestinal: Negative for nausea. Negative for vomiting.  Genitourinary: Negative for dysuria. Musculoskeletal: Negative for leg swelling. Skin: Negative for rash. Neurological: Negative for headaches.  Positive for syncope   Physical Exam  Vital Signs: ED Triage Vitals  Enc Vitals Group     BP 01/08/20 1426 113/68     Pulse Rate 01/08/20 1421 (!) 58     Resp 01/08/20 1421 17     Temp 01/08/20 1421 98.3 F (36.8 C)     Temp Source 01/08/20 1421 Oral     SpO2 --      Weight 01/08/20 1423  180 lb (81.6 kg)     Height 01/08/20 1423 5\' 5"  (1.651 m)     Head Circumference --      Peak Flow --      Pain Score 01/08/20 1422 0     Pain Loc --      Pain Edu? --      Excl. in [place]? --     Constitutional: Alert and oriented. Well appearing. NAD.  Head: Normocephalic. Atraumatic. Midface stable. Jaw is well aligned.  Eyes: Conjunctivae clear. Sclera anicteric. Pupils equal and symmetric.  Small area of ecchymosis lateral to the left eye.  Full EOMI, no evidence of entrapment. Nose: No masses or lesions. No congestion or rhinorrhea. Mouth/Throat: Wearing mask. No intraoral or dental trauma. Jaw well aligned. Neck: No stridor. Trachea midline.  Cardiovascular: Normal rate, regular rhythm. Systolic murmur. Extremities well perfused.  2+ symmetric radial and DP pulses. Respiratory: Normal respiratory effort.  Lungs CTAB. Gastrointestinal: Soft. Non-distended. Non-tender.  Genitourinary: Deferred. Musculoskeletal: No lower extremity edema. No deformities.  Mild tenderness to palpation of the distal left tibia/fibula area.  FROM bilateral shoulders, elbows, wrists, hips, knees, ankles. Back: No midline C/T/L-spine tenderness. Neurologic:  Normal speech and language. No gross focal or lateralizing neurologic deficits are appreciated.  Skin:  Small area of ecchymosis lateral to the left eye, as noted above. Otherwise no lacerations or abrasions.  Psychiatric: Mood and affect are appropriate for situation.  EKG  Personally reviewed and interpreted by myself.   Date: 01/08/2020 Time: 1425 Rate: 59 Rhythm: Junctional Axis: Normal Intervals: QRS 129 ms, QTC 453 ms Left bundle branch block, appears new Junctional bradycardia No STEMI    Radiology  Personally reviewed available imaging myself.   CT head/face/CS  IMPRESSION:  1. No acute intracranial findings.  2. Mild parenchymal volume loss and chronic white matter angiopathy.  3. Minimal soft tissue swelling inferior to the  left orbit,  correlate with site of reported injury. No facial bone fracture.  4. Few carious lesions within the maxillary dentition. Correlate  with dental exam.  5. No acute fracture or traumatic  listhesis.  6. Cervical spondylitic and facet degenerative changes, as detailed  above.  7. Dense cervical carotid atherosclerosis.  8. Diminutive appearance of the thyroid gland, correlate for prior  surgery and or ablation.   XR tib/fib  IMPRESSION:  No acute fracture.   CXR IMPRESSION:  No active disease.     Procedures  Procedure(s) performed (including critical care):  .1-3 Lead EKG Interpretation Performed by: Lilia Pro., MD Authorized by: Lilia Pro., MD     Rhythm comment:  Irregular, occasionally appears sinus, then junctional,   Ectopy: PVCs   Comments:     Indication: syncope, irregular, occasionally appears sinus, then junction, heavy PVC burden     Initial Impression / Assessment and Plan / MDM / ED Course  86 y.o. female who presents to the ED for an episode of loss of consciousness.  Ddx: symptomatic atrial fibrillation, other arrhythmia, symptomatic AS (given murmur on exam) hypoglycemia, electrolyte abnormality, anemia, atypical ACS. Doubt PE given patient compliant on her anticoagulation. Will obtain labs, EKG, urine, cardiac monitoring.  Ddx: with regards to fall, consider intracranial bleeding given she is on anticoagulation, ankle contusion versus fracture.  Will obtain imaging.  EKG appears to be junctional, which certainly could be contributing to her syncope. Perhaps affected by her BB.  Clinical Course as of Jan 08 2252  Wed Jan 08, 2020  1656 Imaging without acute abnormalities.  Labs thus far without actionable derangements.  On cardiac monitoring she does seem to have significant variability, she usually appears sinus, then appears junctional, with frequent and heavy PVC burden.  Suspect an element of arrhythmia contributing to her  presentation today.  On review of EMS rhythm strips, she was as low as the 30s.  Perhaps affected by her metoprolol.  We will plan to admit for further cardiac monitoring and review of her medications.  Patient and family are agreeable.   [SM]    Clinical Course User Index [SM] Lilia Pro., MD     _______________________________   As part of my medical decision making I have reviewed available labs, radiology tests, reviewed old records/chart review, obtained additional history from family.    Final Clinical Impression(s) / ED Diagnosis  Final diagnoses:  Loss of consciousness ([place])  Fall, initial encounter       Note:  This document was prepared using Dragon voice recognition software and may include unintentional dictation errors.   Lilia Pro., MD 01/08/20 2253

## 2020-01-08 NOTE — ED Triage Notes (Signed)
Patient from [facility] with c/o of rushing to [place] and became dizzy and synopsized. C/o of left  Leg and knee  Pain. Also c/o of  left eye pain like.

## 2020-01-08 NOTE — Telephone Encounter (Signed)
Daughter called to inform us that she needed to cancel Kerri Mills's surgery scheduled for 01/09/20. Kerri Mills fell and is at the ED. They will call at a later time to reschedule. Notified Kerri Mills at [facility] and Dr. Amalia Mills

## 2020-01-08 NOTE — ED Notes (Signed)
Dr Joan Mayans at bedside to update pt

## 2020-01-09 ENCOUNTER — Telehealth: Payer: Self-pay

## 2020-01-09 ENCOUNTER — Ambulatory Visit (INDEPENDENT_AMBULATORY_CARE_PROVIDER_SITE_OTHER): Payer: Medicare Other

## 2020-01-09 ENCOUNTER — Telehealth: Payer: Self-pay | Admitting: Cardiovascular Disease

## 2020-01-09 DIAGNOSIS — R001 Bradycardia, unspecified: Secondary | ICD-10-CM | POA: Diagnosis not present

## 2020-01-09 DIAGNOSIS — R55 Syncope and collapse: Secondary | ICD-10-CM

## 2020-01-09 DIAGNOSIS — I495 Sick sinus syndrome: Secondary | ICD-10-CM | POA: Diagnosis not present

## 2020-01-09 DIAGNOSIS — I480 Paroxysmal atrial fibrillation: Secondary | ICD-10-CM

## 2020-01-09 LAB — BASIC METABOLIC PANEL
Anion gap: 8 (ref 5–15)
BUN: 17 mg/dL (ref 8–23)
CO2: 29 mmol/L (ref 22–32)
Calcium: 9.5 mg/dL (ref 8.9–10.3)
Chloride: 105 mmol/L (ref 98–111)
Creatinine, Ser: 0.97 mg/dL (ref 0.44–1.00)
GFR calc Af Amer: >60 mL/min (ref >60)
GFR calc non Af Amer: 53 mL/min — ABNORMAL LOW (ref >60)
Glucose, Bld: 115 mg/dL — ABNORMAL HIGH (ref 70–99)
Potassium: 3.9 mmol/L (ref 3.5–5.1)
Sodium: 142 mmol/L (ref 135–145)

## 2020-01-09 LAB — CBC
HCT: 37.7 % (ref 36.0–46.0)
Hemoglobin: 12.2 g/dL (ref 12.0–15.0)
MCH: 30.6 pg (ref 26.0–34.0)
MCHC: 32.4 g/dL (ref 30.0–36.0)
MCV: 94.5 fL (ref 80.0–100.0)
Platelets: 202 10*3/uL (ref 150–400)
RBC: 3.99 MIL/uL (ref 3.87–5.11)
RDW: 12.4 % (ref 11.5–15.5)
WBC: 6.6 10*3/uL (ref 4.0–10.5)
nRBC: 0 % (ref 0.0–0.2)

## 2020-01-09 LAB — MAGNESIUM: Magnesium: 2.3 mg/dL (ref 1.7–2.4)

## 2020-01-09 MED ORDER — LISINOPRIL 20 MG PO TABS: 10.0000 mg | ORAL_TABLET | Freq: Every day | ORAL | Status: DC

## 2020-01-09 NOTE — Discharge Summary (Signed)
Physician Discharge Summary   [name]  female DOB: 10-04-1930  [id number]  PCP: Einar Pheasant, MD  Admit date: 01/08/2020 Discharge date: 01/10/2020  [facility]  Disposition:  Transfer to [place]: Full code  Discharge Instructions    Diet - low sodium heart healthy   Complete by: As directed    Increase activity slowly   Complete by: As directed        Hospital Course:  For full details, please see H&P, progress notes, consult notes and ancillary notes.  Briefly,  Kerri Mills a 86 y.o.Caucasianfemalewith medical history significant ofAfib on Eliquis, hypothyroidism, HTN [facility].   # Syncope 2/2 sick sinus syndrome/arrhythmia EMS found pt's HR in 30's on initial arrival. EKG in the ED showed new LBBB with rate of 50's. Pt took Toprol 12.5 mg daily the morning prior to arrival.  Home Toprol was held, cardiology was consulted who placed a Zio Patch for outpatient monitoring.  However, shortly after Zio Patch placement, pt was found to have long pauses of at least 6 secs with associated presyncope symptoms.  Case was discussed with EP in [place], and decision was made to transfer pt to [facility] for possible pacemaker placement.  # Hx of Afib on Eliquis, rate controlled Home Toprol held due to bradycardia.  Home Eliquis held for upcoming pacemaker insertion.  # HTN continued home Lisinopril10 mg daily  # Hypothyroidism TSH wnl.. continued home Synthroid  # HLD continued statin as pravastatin  # GERD continued PPI  # Depression continued home Cymbalta   Discharge Diagnoses:  Active Problems:   Syncope   Heart block    Discharge Instructions:  Allergies as of 01/10/2020      Reactions   Atorvastatin    Other reaction(s): Other (See Comments) STIFFNESS AND SORE STIFFNESS AND SORE   Lipitor [atorvastatin Calcium]  Other (See Comments)   Stiffness & soreness   Penicillins Rash   REACTION: Unknown reaction      Medication List    STOP taking these medications   metoprolol succinate 25 MG 24 hr tablet Commonly known as: TOPROL-XL     TAKE these medications   albuterol 108 (90 Base) MCG/ACT inhaler Commonly known as: ProAir HFA Inhale 2 puffs into the lungs every 6 (six) hours as needed for wheezing or shortness of breath.   DULoxetine 60 MG capsule Commonly known as: CYMBALTA TAKE 1 CAPSULE (60 MG TOTAL) BY MOUTH AT BEDTIME.   Eliquis 5 MG Tabs tablet Generic drug: apixaban TAKE 1 TABLET BY MOUTH TWICE A DAY What changed: how much to take   furosemide 20 MG tablet Commonly known as: LASIX TAKE 1 TABLET (20 MG TOTAL) BY MOUTH AS DIRECTED. TAKE 1 TABLET (20 MG) TWICE A WEEK WITH POTASSIUM   gabapentin 300 MG capsule Commonly known as: NEURONTIN TAKE 2 CAPSULES (600 MG TOTAL) BY MOUTH AT BEDTIME. What changed:   how much to take  how to take this  when to take this  additional instructions   levothyroxine 88 MCG tablet Commonly known as: SYNTHROID Take 1 tablet (88 mcg total) by mouth daily before breakfast. TAKE ONE TABLET BY MOUTH ONCE DAILY BEFORE BREAKFAST What changed: additional instructions   lisinopril 20 MG tablet Commonly known as: ZESTRIL Take 0.5 tablets (10 mg total) by mouth daily.   lovastatin 40 MG tablet Commonly known as: MEVACOR Take 1 tablet (40 mg total) by  mouth at bedtime.   omeprazole 20 MG capsule Commonly known as: PRILOSEC TAKE 1 CAPSULE BY MOUTH EVERY DAY What changed: how much to take   potassium chloride 10 MEQ tablet Commonly known as: KLOR-CON TAKE 2 TABLETS (20 MEQ TOTAL) BY MOUTH AS DIRECTED. TAKE 2 TABLETS (20 MEQ) TWICE A WEEK WITH LASIX   PreserVision AREDS 2 Caps Take 1 tablet by mouth 2 (two) times daily.   Vitamin D 1000 units capsule Take 1,000 Units by mouth daily.       Follow-up Information    Gollan, Kathlene November,  MD .   Specialty: Cardiology Contact information: [address] [place] [address] [phone]           Allergies  Allergen Reactions  . Atorvastatin     Other reaction(s): Other (See Comments) STIFFNESS AND SORE STIFFNESS AND SORE  . Lipitor [Atorvastatin Calcium] Other (See Comments)    Stiffness & soreness  . Penicillins Rash    REACTION: Unknown reaction     The results of significant diagnostics from this hospitalization (including imaging, microbiology, ancillary and laboratory) are listed below for reference.   Consultations:   Procedures/Studies: DG Tibia/Fibula Left  Result Date: 01/08/2020 CLINICAL DATA:  [place]. Injured left leg. EXAM: LEFT TIBIA AND FIBULA - 2 VIEW COMPARISON:  None. FINDINGS: The knee and ankle joints are maintained. Moderate degenerative changes are noted at the knee joint. The tibia and fibula are intact. No acute fracture is identified. IMPRESSION: No acute fracture. Electronically Signed   By: Marijo Sanes M.D.   On: 01/08/2020 15:58   CT Head Wo Contrast  Result Date: 01/08/2020 CLINICAL DATA:  Fall, bruising beneath the left eye, neck pain with movement EXAM: CT HEAD WITHOUT CONTRAST CT MAXILLOFACIAL WITHOUT CONTRAST CT CERVICAL SPINE WITHOUT CONTRAST TECHNIQUE: Multidetector CT imaging of the head, cervical spine, and maxillofacial structures were performed using the standard protocol without intravenous contrast. Multiplanar CT image reconstructions of the cervical spine and maxillofacial structures were also generated. COMPARISON:  CT head 11/21/2018 FINDINGS: CT HEAD FINDINGS Brain: No evidence of acute infarction, hemorrhage, hydrocephalus, extra-axial collection or mass lesion/mass effect. Symmetric prominence of the ventricles, cisterns and sulci compatible with parenchymal volume loss. Patchy areas of white matter hypoattenuation are most compatible with chronic microvascular angiopathy. Vascular: Atherosclerotic  calcification of the carotid siphons. No hyperdense vessel. Skull: No calvarial fracture or suspicious osseous lesion. No scalp swelling or hematoma. Other: None CT MAXILLOFACIAL FINDINGS Osseous: No fracture of the bony orbits. Nasal bones are intact. No mid face fractures are seen. The pterygoid plates are intact. The mandible is intact. Temporomandibular joints are normally aligned. No temporal bone fractures are identified. No fractured or avulsed teeth. Few carious lesions within the maxillary dentition. Orbits: Some mild left infraorbital soft tissue swelling. No retro septal gas, stranding or hemorrhage. Prior bilateral lens extractions. The globes appear normal and symmetric. Symmetric appearance of the extraocular musculature and optic nerve sheath complexes. Normal caliber of the superior ophthalmic veins. Sinuses: Minimal mural thickening in the ethmoid sinuses. Remaining paranasal sinuses are predominantly clear. Mastoid air cells are well aerated. Middle ear cavities are clear. Soft tissues: Minimal soft tissue swelling inferior to the left orbit, correlate with site of reported injury. No other significant soft tissue swelling. No soft tissue gas or foreign body. CT CERVICAL SPINE FINDINGS Alignment: Stabilization collar is not visualized at the time of exam. There is straightening and focal reversal of the normal lordosis centered at C3. Mild  anterolisthesis C2 on C3 is likely on a degenerative basis with notable fusion of the left C2-3 facets. No other significant spondylolisthesis. No traumatic listhesis is evident. No abnormally widened, perched or jumped facets. Skull base and vertebrae: No acute fracture. No primary bone lesion or focal pathologic process. Soft tissues and spinal canal: No pre or paravertebral fluid or swelling. No visible canal hematoma. Disc levels: Severe facet degenerative changes noted at the C1-2 lateral masses. Additional mild-to-moderate multilevel cervical spondylitic  changes are present most pronounced C5-C7 at most mild canal stenosis at the C5-6 level. Multilevel uncinate spurring and facet hypertrophic changes result in mild-to-moderate multilevel neural foraminal narrowing without severe foraminal stenosis. Upper chest: No acute abnormality in the upper chest or imaged lung apices. Other: Dense cervical carotid atherosclerosis. Thyroid tissue appears diminutive, correlate for prior ablation or surgery. IMPRESSION: 1. No acute intracranial findings. 2. Mild parenchymal volume loss and chronic white matter angiopathy. 3. Minimal soft tissue swelling inferior to the left orbit, correlate with site of reported injury. No facial bone fracture. 4. Few carious lesions within the maxillary dentition. Correlate with dental exam. 5. No acute fracture or traumatic listhesis. 6. Cervical spondylitic and facet degenerative changes, as detailed above. 7. Dense cervical carotid atherosclerosis. 8. Diminutive appearance of the thyroid gland, correlate for prior surgery and or ablation. Electronically Signed   By: Lovena Le M.D.   On: 01/08/2020 15:44   CT CERVICAL SPINE WO CONTRAST  Result Date: 01/08/2020 CLINICAL DATA:  Fall, bruising beneath the left eye, neck pain with movement EXAM: CT HEAD WITHOUT CONTRAST CT MAXILLOFACIAL WITHOUT CONTRAST CT CERVICAL SPINE WITHOUT CONTRAST TECHNIQUE: Multidetector CT imaging of the head, cervical spine, and maxillofacial structures were performed using the standard protocol without intravenous contrast. Multiplanar CT image reconstructions of the cervical spine and maxillofacial structures were also generated. COMPARISON:  CT head 11/21/2018 FINDINGS: CT HEAD FINDINGS Brain: No evidence of acute infarction, hemorrhage, hydrocephalus, extra-axial collection or mass lesion/mass effect. Symmetric prominence of the ventricles, cisterns and sulci compatible with parenchymal volume loss. Patchy areas of white matter hypoattenuation are most  compatible with chronic microvascular angiopathy. Vascular: Atherosclerotic calcification of the carotid siphons. No hyperdense vessel. Skull: No calvarial fracture or suspicious osseous lesion. No scalp swelling or hematoma. Other: None CT MAXILLOFACIAL FINDINGS Osseous: No fracture of the bony orbits. Nasal bones are intact. No mid face fractures are seen. The pterygoid plates are intact. The mandible is intact. Temporomandibular joints are normally aligned. No temporal bone fractures are identified. No fractured or avulsed teeth. Few carious lesions within the maxillary dentition. Orbits: Some mild left infraorbital soft tissue swelling. No retro septal gas, stranding or hemorrhage. Prior bilateral lens extractions. The globes appear normal and symmetric. Symmetric appearance of the extraocular musculature and optic nerve sheath complexes. Normal caliber of the superior ophthalmic veins. Sinuses: Minimal mural thickening in the ethmoid sinuses. Remaining paranasal sinuses are predominantly clear. Mastoid air cells are well aerated. Middle ear cavities are clear. Soft tissues: Minimal soft tissue swelling inferior to the left orbit, correlate with site of reported injury. No other significant soft tissue swelling. No soft tissue gas or foreign body. CT CERVICAL SPINE FINDINGS Alignment: Stabilization collar is not visualized at the time of exam. There is straightening and focal reversal of the normal lordosis centered at C3. Mild anterolisthesis C2 on C3 is likely on a degenerative basis with notable fusion of the left C2-3 facets. No other significant spondylolisthesis. No traumatic listhesis is evident. No abnormally widened, perched  or jumped facets. Skull base and vertebrae: No acute fracture. No primary bone lesion or focal pathologic process. Soft tissues and spinal canal: No pre or paravertebral fluid or swelling. No visible canal hematoma. Disc levels: Severe facet degenerative changes noted at the C1-2  lateral masses. Additional mild-to-moderate multilevel cervical spondylitic changes are present most pronounced C5-C7 at most mild canal stenosis at the C5-6 level. Multilevel uncinate spurring and facet hypertrophic changes result in mild-to-moderate multilevel neural foraminal narrowing without severe foraminal stenosis. Upper chest: No acute abnormality in the upper chest or imaged lung apices. Other: Dense cervical carotid atherosclerosis. Thyroid tissue appears diminutive, correlate for prior ablation or surgery. IMPRESSION: 1. No acute intracranial findings. 2. Mild parenchymal volume loss and chronic white matter angiopathy. 3. Minimal soft tissue swelling inferior to the left orbit, correlate with site of reported injury. No facial bone fracture. 4. Few carious lesions within the maxillary dentition. Correlate with dental exam. 5. No acute fracture or traumatic listhesis. 6. Cervical spondylitic and facet degenerative changes, as detailed above. 7. Dense cervical carotid atherosclerosis. 8. Diminutive appearance of the thyroid gland, correlate for prior surgery and or ablation. Electronically Signed   By: Lovena Le M.D.   On: 01/08/2020 15:44   DG Chest Port 1 View  Result Date: 01/08/2020 CLINICAL DATA:  Syncope shortness of breath EXAM: PORTABLE CHEST 1 VIEW COMPARISON:  October 20, 2019 FINDINGS: There is mild cardiomegaly. Aortic knob calcifications. The lungs are clear. No focal airspace consolidation or pleural effusion. No acute osseous abnormality. IMPRESSION: No active disease. Electronically Signed   By: Prudencio Pair M.D.   On: 01/08/2020 16:01   MM 3D SCREEN BREAST BILATERAL  Result Date: 12/20/2019 CLINICAL DATA:  Screening. EXAM: DIGITAL SCREENING BILATERAL MAMMOGRAM WITH TOMO AND CAD COMPARISON:  Previous exam(s). ACR Breast Density Category b: There are scattered areas of fibroglandular density. FINDINGS: There are no findings suspicious for malignancy. Post lumpectomy changes are  noted on the left including notable fat necrosis calcifications. Images were processed with CAD. IMPRESSION: No mammographic evidence of malignancy. A result letter of this screening mammogram will be mailed directly to the patient. RECOMMENDATION: Screening mammogram in one year. (Code:[id number]) BI-RADS CATEGORY  2: Benign. Electronically Signed   By: Lajean Manes M.D.   On: 12/20/2019 07:59   CT MAXILLOFACIAL WO CONTRAST  Result Date: 01/08/2020 CLINICAL DATA:  Fall, bruising beneath the left eye, neck pain with movement EXAM: CT HEAD WITHOUT CONTRAST CT MAXILLOFACIAL WITHOUT CONTRAST CT CERVICAL SPINE WITHOUT CONTRAST TECHNIQUE: Multidetector CT imaging of the head, cervical spine, and maxillofacial structures were performed using the standard protocol without intravenous contrast. Multiplanar CT image reconstructions of the cervical spine and maxillofacial structures were also generated. COMPARISON:  CT head 11/21/2018 FINDINGS: CT HEAD FINDINGS Brain: No evidence of acute infarction, hemorrhage, hydrocephalus, extra-axial collection or mass lesion/mass effect. Symmetric prominence of the ventricles, cisterns and sulci compatible with parenchymal volume loss. Patchy areas of white matter hypoattenuation are most compatible with chronic microvascular angiopathy. Vascular: Atherosclerotic calcification of the carotid siphons. No hyperdense vessel. Skull: No calvarial fracture or suspicious osseous lesion. No scalp swelling or hematoma. Other: None CT MAXILLOFACIAL FINDINGS Osseous: No fracture of the bony orbits. Nasal bones are intact. No mid face fractures are seen. The pterygoid plates are intact. The mandible is intact. Temporomandibular joints are normally aligned. No temporal bone fractures are identified. No fractured or avulsed teeth. Few carious lesions within the maxillary dentition. Orbits: Some mild left infraorbital soft tissue swelling. No  retro septal gas, stranding or hemorrhage. Prior  bilateral lens extractions. The globes appear normal and symmetric. Symmetric appearance of the extraocular musculature and optic nerve sheath complexes. Normal caliber of the superior ophthalmic veins. Sinuses: Minimal mural thickening in the ethmoid sinuses. Remaining paranasal sinuses are predominantly clear. Mastoid air cells are well aerated. Middle ear cavities are clear. Soft tissues: Minimal soft tissue swelling inferior to the left orbit, correlate with site of reported injury. No other significant soft tissue swelling. No soft tissue gas or foreign body. CT CERVICAL SPINE FINDINGS Alignment: Stabilization collar is not visualized at the time of exam. There is straightening and focal reversal of the normal lordosis centered at C3. Mild anterolisthesis C2 on C3 is likely on a degenerative basis with notable fusion of the left C2-3 facets. No other significant spondylolisthesis. No traumatic listhesis is evident. No abnormally widened, perched or jumped facets. Skull base and vertebrae: No acute fracture. No primary bone lesion or focal pathologic process. Soft tissues and spinal canal: No pre or paravertebral fluid or swelling. No visible canal hematoma. Disc levels: Severe facet degenerative changes noted at the C1-2 lateral masses. Additional mild-to-moderate multilevel cervical spondylitic changes are present most pronounced C5-C7 at most mild canal stenosis at the C5-6 level. Multilevel uncinate spurring and facet hypertrophic changes result in mild-to-moderate multilevel neural foraminal narrowing without severe foraminal stenosis. Upper chest: No acute abnormality in the upper chest or imaged lung apices. Other: Dense cervical carotid atherosclerosis. Thyroid tissue appears diminutive, correlate for prior ablation or surgery. IMPRESSION: 1. No acute intracranial findings. 2. Mild parenchymal volume loss and chronic white matter angiopathy. 3. Minimal soft tissue swelling inferior to the left orbit,  correlate with site of reported injury. No facial bone fracture. 4. Few carious lesions within the maxillary dentition. Correlate with dental exam. 5. No acute fracture or traumatic listhesis. 6. Cervical spondylitic and facet degenerative changes, as detailed above. 7. Dense cervical carotid atherosclerosis. 8. Diminutive appearance of the thyroid gland, correlate for prior surgery and or ablation. Electronically Signed   By: Lovena Le M.D.   On: 01/08/2020 15:44      Labs: BNP (last 3 results) No results for input(s): BNP in the last 8760 hours. Basic Metabolic Panel: Recent Labs  Lab 01/08/20 1430 01/09/20 0358 01/10/20 0404  NA 140 142 141  K 4.2 3.9 4.0  CL 105 105 104  CO2 26 29 26   GLUCOSE 160* 115* 119*  BUN 16 17 13   CREATININE 0.97 0.97 0.74  CALCIUM 9.7 9.5 9.7  MG 2.0 2.3 2.1   Liver Function Tests: Recent Labs  Lab 01/08/20 1430  AST 20  ALT 15  ALKPHOS 111  BILITOT 0.9  PROT 6.6  ALBUMIN 3.8   No results for input(s): LIPASE, AMYLASE in the last 168 hours. No results for input(s): AMMONIA in the last 168 hours. CBC: Recent Labs  Lab 01/08/20 1430 01/09/20 0358 01/10/20 0404  WBC 6.0 6.6 6.7  NEUTROABS 4.0  --   --   HGB 12.7 12.2 13.0  HCT 39.8 37.7 40.6  MCV 94.3 94.5 95.3  PLT 206 202 210   Cardiac Enzymes: No results for input(s): CKTOTAL, CKMB, CKMBINDEX, TROPONINI in the last 168 hours. BNP: Invalid input(s): POCBNP CBG: No results for input(s): GLUCAP in the last 168 hours. D-Dimer No results for input(s): DDIMER in the last 72 hours. Hgb A1c No results for input(s): HGBA1C in the last 72 hours. Lipid Profile No results for input(s): CHOL, HDL, LDLCALC,  TRIG, CHOLHDL, LDLDIRECT in the last 72 hours. Thyroid function studies Recent Labs    01/08/20 1430  TSH 1.439   Anemia work up No results for input(s): VITAMINB12, FOLATE, FERRITIN, TIBC, IRON, RETICCTPCT in the last 72 hours. Urinalysis    Component Value Date/Time    COLORURINE YELLOW (A) 01/08/2020 1736   APPEARANCEUR CLEAR (A) 01/08/2020 1736   LABSPEC 1.015 01/08/2020 1736   PHURINE 6.0 01/08/2020 1736   GLUCOSEU NEGATIVE 01/08/2020 1736   HGBUR NEGATIVE 01/08/2020 1736   BILIRUBINUR NEGATIVE 01/08/2020 1736   KETONESUR NEGATIVE 01/08/2020 1736   PROTEINUR NEGATIVE 01/08/2020 1736   NITRITE NEGATIVE 01/08/2020 1736   LEUKOCYTESUR NEGATIVE 01/08/2020 1736   Sepsis Labs Invalid input(s): PROCALCITONIN,  WBC,  LACTICIDVEN Microbiology Recent Results (from the past 240 hour(s))  Respiratory Panel by RT PCR (Flu A&B, Covid) - Nasopharyngeal Swab     Status: None   Collection Time: 01/08/20  9:02 PM   Specimen: Nasopharyngeal Swab  Result Value Ref Range Status   SARS Coronavirus 2 by RT PCR NEGATIVE NEGATIVE Final    Comment: (NOTE) SARS-CoV-2 target nucleic acids are NOT DETECTED. The SARS-CoV-2 RNA is generally detectable in upper respiratoy specimens during the acute phase of infection. The lowest concentration of SARS-CoV-2 viral copies this assay can detect is 131 copies/mL. A negative result does not preclude SARS-Cov-2 infection and should not be used as the sole basis for treatment or other patient management decisions. A negative result may occur with  improper specimen collection/handling, submission of specimen other than nasopharyngeal swab, presence of viral mutation(s) within the areas targeted by this assay, and inadequate number of viral copies (<131 copies/mL). A negative result must be combined with clinical observations, patient history, and epidemiological information. The expected result is Negative. Fact Sheet for Patients:  PinkCheek.be Fact Sheet for Healthcare Providers:  GravelBags.it This test is not yet ap proved or cleared by the [place] FDA and  has been authorized for detection and/or diagnosis of SARS-CoV-2 by FDA under an Emergency Use  Authorization (EUA). This EUA will remain  in effect (meaning this test can be used) for the duration of the COVID-19 declaration under Section 564(b)(1) of the Act, 21 U.S.C. section 360bbb-3(b)(1), unless the authorization is terminated or revoked sooner.    Influenza A by PCR NEGATIVE NEGATIVE Final   Influenza B by PCR NEGATIVE NEGATIVE Final    Comment: (NOTE) The Xpert Xpress SARS-CoV-2/FLU/RSV assay is intended as an aid in  the diagnosis of influenza from Nasopharyngeal swab specimens and  should not be used as a sole basis for treatment. Nasal washings and  aspirates are unacceptable for Xpert Xpress SARS-CoV-2/FLU/RSV  testing. Fact Sheet for Patients: PinkCheek.be Fact Sheet for Healthcare Providers: GravelBags.it This test is not yet approved or cleared by the [place] FDA and  has been authorized for detection and/or diagnosis of SARS-CoV-2 by  FDA under an Emergency Use Authorization (EUA). This EUA will remain  in effect (meaning this test can be used) for the duration of the  Covid-19 declaration under Section 564(b)(1) of the Act, 21  U.S.C. section 360bbb-3(b)(1), unless the authorization is  terminated or revoked. Performed at [facility], [address] [place], [place] [address]      Total time spend on discharging this patient, including the last patient exam, discussing the hospital stay, instructions for ongoing care as it relates to all pertinent caregivers, as well as preparing the medical discharge records, prescriptions, and/or referrals as applicable,  is 30 minutes.    Enzo Bi, MD  [facility] 01/10/2020, 9:29 AM  If 7PM-7AM, please contact night-coverage

## 2020-01-09 NOTE — Telephone Encounter (Signed)
Verbal request from Ignacia Bayley, NP to place zio AT monitor for recent syncope.

## 2020-01-09 NOTE — Progress Notes (Signed)
Assumed care of patient this evening. Patient is alert oriented and pleasant. Patient has c/o slight right ankle pain, and slight headache which she states is improved from when she was in the ER. There does appear to be small bruise forming about patients left eye from a fall she had after a syncopal episode at home. She states rushing around and stress from a lost check may have been the cause of her syncopal episode.  Patient place on tele. Showing either sinus brady or Afib which patient has a hx of. Patient sitting up and eating dinner a this time. Safety checks completed and call light placed within reach. Will continue to monitor.

## 2020-01-09 NOTE — Consult Note (Addendum)
Cardiology Consult    Patient ID: Kerri Mills MRN: [id number], DOB/AGE: [birth year]   Admit date: 01/08/2020 Date of Consult: 01/09/2020  Primary Physician: Einar Pheasant, MD Primary Cardiologist: Ida Rogue, MD Requesting Provider: Lolita Cram, MD  Patient Profile    Kerri Mills is a 86 y.o. female with a history of persistent atrial fibrillation, atypical chest pain, hypertension, hyperlipidemia, demand ischemia, intermittent left bundle branch block, remote DVT (1992), obstructive sleep apnea, breast cancer status post lumpectomy and radiation therapy, chronic venous insufficiency with lower extremity edema, thyroid cancer, melanoma, depression, gastric ulcer, GERD, and diverticulitis who is being seen today for the evaluation of syncope at the request of Dr. Billie Ruddy.  Past Medical History   Past Medical History:  Diagnosis Date  . Allergy   . Anxiety   . Arthritis   . Atypical chest pain    a. 10/2018 MV: small, fixed apical defect possibly 2/2 attenuation artifact. No ischemia.  EF 68%.  . Clotting disorder ([place])   . Colitis   . Depression   . Diverticulitis 2013  . Gastric ulcer   . GERD (gastroesophageal reflux disease)   . History of echocardiogram    a. 09/2019 Echo: EF 55-60%, mod LVH. Mildly dil LA. Triv MR/TR.  Marland Kitchen Hypercholesterolemia   . Hypertension   . Infiltrating lobular carcinoma of left breast 2011   T2,N0, ER: 90%; PR 0%; Her 2 neu not amplified. [facility]).  . Melanoma ([place]) 1997  . Melanoma in situ of upper extremity ([place]) 03/19/2011  . Persistent atrial fibrillation ([place])    a. CHADS2VASc => 4 (HTN, age x 2, female)  . Personal history of radiation therapy 2011   BREAST CA  . Seroma    HISTORY OF LFT BREAST  . Sleep apnea   . Thyroid cancer ([place]) 1992    Past Surgical History:  Procedure Laterality Date  . ABDOMINAL HYSTERECTOMY  1973   partial  . BREAST BIOPSY Left 02-13-13   BENIGN BREAST TISSUE WITH CHANGES CONSISTENT WITH FAT  NECROSIS  . BREAST BIOPSY Left 01/21/2015   bx done in [facility] office 11:00 left 6-8cmfn  . BREAST EXCISIONAL BIOPSY Left 1995   neg  . BREAST EXCISIONAL BIOPSY Left 2011   Breast cancer radiation  . BREAST LUMPECTOMY Left 2011   BREAST CA  . CARDIAC CATHETERIZATION    . CHOLECYSTECTOMY    . COLONOSCOPY  2013  . MELANOMA EXCISION     RT UPPER ARM  . PARTIAL HYSTERECTOMY     bleeding, ovaries in place.    . THYROID SURGERY  1992   FOR THYROID CANCER  . TONSILLECTOMY       Allergies  Allergies  Allergen Reactions  . Atorvastatin     Other reaction(s): Other (See Comments) STIFFNESS AND SORE STIFFNESS AND SORE  . Lipitor [Atorvastatin Calcium] Other (See Comments)    Stiffness & soreness  . Penicillins Rash    REACTION: Unknown reaction    History of Present Illness    86 year old female with the above complex past medical history including persistent atrial fibrillation, hypertension, hyperlipidemia, demand ischemia, intermittent left bundle branch block, atypical chest pain, breast cancer status post lumpectomy and radiation therapy, chronic venous insufficiency with lower extremity edema, thyroid cancer, melanoma, depression, gastric ulcer, GERD, and diverticulitis.  In January 2019, she was noted with chest pain and atrial fibrillation with rapid ventricular spots.  Echo showed normal LV function with grade 1 diastolic dysfunction.  She subsequently converted  to sinus rhythm and developed a cardia on both beta-blocker diltiazem therapy resulting in recommendation for discontinuation of both.  She has been anticoagulated with Eliquis.  In February 2020, she was seen with complaints of crushing chest pain of 2 weeks duration.  Lexiscan stress testing was undertaken and was low risk/nonischemic.  In January of this year, she was admitted to [facility] with "electrical shock-like" chest pain.  Lab work was unremarkable and troponin was normal.  She was admitted and ruled out.   She was noted to have frequent PACs and PVCs in the emergency department.  She was placed back on low-dose metoprolol.  Echocardiogram showed normal LV function.  In the absence of objective evidence of ischemia with normal cardiac markers and atypical symptoms, it was not felt that she required further ischemic evaluation and she was subsequently discharged.  At office follow-up on February 4, she was feeling well and was maintaining sinus rhythm.  She says that she had continued to feel well until April 14, when she went to the cafeteria at [facility], where she lives, and after picking up her meal, she was walking back across the cafeteria and suddenly felt short of breath and lightheaded.  She felt as though she might pass out and was trying to get to the nearest table but subsequently lost consciousness and fell to the floor.  This was witnessed by several staff members.  She does not know how long she was without consciousness but when she regained consciousness, the EMTs were already coming into the building.  She denies experiencing chest pain, dyspnea, nausea, diaphoresis, or vomiting upon regaining consciousness.  ECG performed by EMS initially showed what appears to be slow atrial fibrillation at a rate of 39 bpm.  5 minutes later, ECG shows a more regular rhythm-possible junctional bradycardia, though it could be sinus with significant baseline artifact.  In the emergency department, initial ECG at 1425 shows what appears to be a junctional bradycardia 59 bpm with a left bundle branch block while follow-up ECG 2 hours later shows what appears to be A. fib at a rate of 66 bpm.  Lab work on presentation was unremarkable.  High-sensitivity troponins have been normal.  Fortunately, CT imaging did not show any significant trauma.  She was admitted and beta-blocker therapy held.  On telemetry, she has maintained sinus rhythm with occasional PACs and PVCs.  Blood pressures have been stable and she has had no  recurrent symptoms.  Inpatient Medications    . apixaban  5 mg Oral BID  . DULoxetine  60 mg Oral QHS  . gabapentin  600 mg Oral QHS  . levothyroxine  88 mcg Oral Q0600  . lisinopril  10 mg Oral Daily  . pantoprazole  40 mg Oral Daily  . pravastatin  40 mg Oral q1800    Family History    Family History  Problem Relation Age of Onset  . Heart disease Mother   . Cancer Brother        lung   . Cancer Sister        breast  . Breast cancer Neg Hx    She indicated that her mother is deceased. She indicated that her father is deceased. She indicated that her sister is alive. She indicated that the status of her brother is unknown. She indicated that her daughter is alive. She indicated that the status of her neg hx is unknown.   Social History    Social History  Socioeconomic History  . Marital status: Widowed    Spouse name: Not on file  . Number of children: Not on file  . Years of education: Not on file  . Highest education level: Not on file  Occupational History  . Not on file  Tobacco Use  . Smoking status: Never Smoker  . Smokeless tobacco: Never Used  Substance and Sexual Activity  . Alcohol use: Yes    Alcohol/week: 0.0 standard drinks    Comment: social drinking. average times a week  . Drug use: No  . Sexual activity: Never  Other Topics Concern  . Not on file  Social History Narrative   Independent and baseline. Lives by herself   Social Determinants of Health   Financial Resource Strain:   . Difficulty of Paying Living Expenses:   Food Insecurity:   . Worried About Charity fundraiser in the Last Year:   . Arboriculturist in the Last Year:   Transportation Needs:   . Film/video editor (Medical):   Marland Kitchen Lack of Transportation (Non-Medical):   Physical Activity:   . Days of Exercise per Week:   . Minutes of Exercise per Session:   Stress:   . Feeling of Stress :   Social Connections:   . Frequency of Communication with Friends and Family:     . Frequency of Social Gatherings with Friends and Family:   . Attends Religious Services:   . Active Member of Clubs or Organizations:   . Attends Archivist Meetings:   Marland Kitchen Marital Status:   Intimate Partner Violence:   . Fear of Current or Ex-Partner:   . Emotionally Abused:   Marland Kitchen Physically Abused:   . Sexually Abused:      Review of Systems    General:  No chills, fever, night sweats or weight changes.  Cardiovascular:  No chest pain, dyspnea on exertion, edema, orthopnea, palpitations, paroxysmal nocturnal dyspnea.  +++ Presyncope and syncope. Dermatological: No rash, lesions/masses Respiratory: No cough, dyspnea Urologic: No hematuria, dysuria Abdominal:   No nausea, vomiting, diarrhea, bright red blood per rectum, melena, or hematemesis Neurologic:  No visual changes, wkns, changes in mental status. All other systems reviewed and are otherwise negative except as noted above.  Physical Exam    Blood pressure 114/72, pulse 62, temperature 98.3 F (36.8 C), temperature source Oral, resp. rate 16, height 5\' 5"  (1.651 m), weight 81.6 kg, SpO2 99 %.  General: Pleasant, NAD Psych: Normal affect. Neuro: Alert and oriented X 3. Moves all extremities spontaneously. HEENT: Normal  Neck: Supple without bruits or JVD. Lungs:  Resp regular and unlabored, CTA. Heart: RRR no s3, s4, or murmurs. Abdomen: Soft, non-tender, non-distended, BS + x 4.  Extremities: No clubbing, cyanosis or edema. DP/PT/Radials 2+ and equal bilaterally.  Labs    Cardiac Enzymes Recent Labs  Lab 01/08/20 1430 01/08/20 1644  TROPONINIHS 7 10      Lab Results  Component Value Date   WBC 6.6 01/09/2020   HGB 12.2 01/09/2020   HCT 37.7 01/09/2020   MCV 94.5 01/09/2020   PLT 202 01/09/2020    Recent Labs  Lab 01/08/20 1430 01/08/20 1430 01/09/20 0358  NA 140   < > 142  K 4.2   < > 3.9  CL 105   < > 105  CO2 26   < > 29  BUN 16   < > 17  CREATININE 0.97   < > 0.97  CALCIUM 9.7   < >  9.5  PROT 6.6  --   --   BILITOT 0.9  --   --   ALKPHOS 111  --   --   ALT 15  --   --   AST 20  --   --   GLUCOSE 160*   < > 115*   < > = values in this interval not displayed.   Lab Results  Component Value Date   CHOL 175 10/21/2019   HDL 33 (L) 10/21/2019   LDLCALC 103 (H) 10/21/2019   TRIG 195 (H) 10/21/2019    Radiology Studies    DG Tibia/Fibula Left  Result Date: 01/08/2020 CLINICAL DATA:  [place]. Injured left leg. EXAM: LEFT TIBIA AND FIBULA - 2 VIEW COMPARISON:  None. FINDINGS: The knee and ankle joints are maintained. Moderate degenerative changes are noted at the knee joint. The tibia and fibula are intact. No acute fracture is identified. IMPRESSION: No acute fracture. Electronically Signed   By: Marijo Sanes M.D.   On: 01/08/2020 15:58   CT Head Wo Contrast  Result Date: 01/08/2020 CLINICAL DATA:  Fall, bruising beneath the left eye, neck pain with movement EXAM: CT HEAD WITHOUT CONTRAST CT MAXILLOFACIAL WITHOUT CONTRAST CT CERVICAL SPINE WITHOUT CONTRAST TECHNIQUE: Multidetector CT imaging of the head, cervical spine, and maxillofacial structures were performed using the standard protocol without intravenous contrast. Multiplanar CT image reconstructions of the cervical spine and maxillofacial structures were also generated. COMPARISON:  CT head 11/21/2018 FINDINGS: CT HEAD FINDINGS Brain: No evidence of acute infarction, hemorrhage, hydrocephalus, extra-axial collection or mass lesion/mass effect. Symmetric prominence of the ventricles, cisterns and sulci compatible with parenchymal volume loss. Patchy areas of white matter hypoattenuation are most compatible with chronic microvascular angiopathy. Vascular: Atherosclerotic calcification of the carotid siphons. No hyperdense vessel. Skull: No calvarial fracture or suspicious osseous lesion. No scalp swelling or hematoma. Other: None CT MAXILLOFACIAL FINDINGS Osseous: No fracture of the bony orbits. Nasal bones are intact. No mid  face fractures are seen. The pterygoid plates are intact. The mandible is intact. Temporomandibular joints are normally aligned. No temporal bone fractures are identified. No fractured or avulsed teeth. Few carious lesions within the maxillary dentition. Orbits: Some mild left infraorbital soft tissue swelling. No retro septal gas, stranding or hemorrhage. Prior bilateral lens extractions. The globes appear normal and symmetric. Symmetric appearance of the extraocular musculature and optic nerve sheath complexes. Normal caliber of the superior ophthalmic veins. Sinuses: Minimal mural thickening in the ethmoid sinuses. Remaining paranasal sinuses are predominantly clear. Mastoid air cells are well aerated. Middle ear cavities are clear. Soft tissues: Minimal soft tissue swelling inferior to the left orbit, correlate with site of reported injury. No other significant soft tissue swelling. No soft tissue gas or foreign body. CT CERVICAL SPINE FINDINGS Alignment: Stabilization collar is not visualized at the time of exam. There is straightening and focal reversal of the normal lordosis centered at C3. Mild anterolisthesis C2 on C3 is likely on a degenerative basis with notable fusion of the left C2-3 facets. No other significant spondylolisthesis. No traumatic listhesis is evident. No abnormally widened, perched or jumped facets. Skull base and vertebrae: No acute fracture. No primary bone lesion or focal pathologic process. Soft tissues and spinal canal: No pre or paravertebral fluid or swelling. No visible canal hematoma. Disc levels: Severe facet degenerative changes noted at the C1-2 lateral masses. Additional mild-to-moderate multilevel cervical spondylitic changes are present most pronounced C5-C7 at most mild canal stenosis at the C5-6 level. Multilevel uncinate  spurring and facet hypertrophic changes result in mild-to-moderate multilevel neural foraminal narrowing without severe foraminal stenosis. Upper chest:  No acute abnormality in the upper chest or imaged lung apices. Other: Dense cervical carotid atherosclerosis. Thyroid tissue appears diminutive, correlate for prior ablation or surgery. IMPRESSION: 1. No acute intracranial findings. 2. Mild parenchymal volume loss and chronic white matter angiopathy. 3. Minimal soft tissue swelling inferior to the left orbit, correlate with site of reported injury. No facial bone fracture. 4. Few carious lesions within the maxillary dentition. Correlate with dental exam. 5. No acute fracture or traumatic listhesis. 6. Cervical spondylitic and facet degenerative changes, as detailed above. 7. Dense cervical carotid atherosclerosis. 8. Diminutive appearance of the thyroid gland, correlate for prior surgery and or ablation. Electronically Signed   By: Lovena Le M.D.   On: 01/08/2020 15:44   CT CERVICAL SPINE WO CONTRAST  Result Date: 01/08/2020 CLINICAL DATA:  Fall, bruising beneath the left eye, neck pain with movement EXAM: CT HEAD WITHOUT CONTRAST CT MAXILLOFACIAL WITHOUT CONTRAST CT CERVICAL SPINE WITHOUT CONTRAST TECHNIQUE: Multidetector CT imaging of the head, cervical spine, and maxillofacial structures were performed using the standard protocol without intravenous contrast. Multiplanar CT image reconstructions of the cervical spine and maxillofacial structures were also generated. COMPARISON:  CT head 11/21/2018 FINDINGS: CT HEAD FINDINGS Brain: No evidence of acute infarction, hemorrhage, hydrocephalus, extra-axial collection or mass lesion/mass effect. Symmetric prominence of the ventricles, cisterns and sulci compatible with parenchymal volume loss. Patchy areas of white matter hypoattenuation are most compatible with chronic microvascular angiopathy. Vascular: Atherosclerotic calcification of the carotid siphons. No hyperdense vessel. Skull: No calvarial fracture or suspicious osseous lesion. No scalp swelling or hematoma. Other: None CT MAXILLOFACIAL FINDINGS  Osseous: No fracture of the bony orbits. Nasal bones are intact. No mid face fractures are seen. The pterygoid plates are intact. The mandible is intact. Temporomandibular joints are normally aligned. No temporal bone fractures are identified. No fractured or avulsed teeth. Few carious lesions within the maxillary dentition. Orbits: Some mild left infraorbital soft tissue swelling. No retro septal gas, stranding or hemorrhage. Prior bilateral lens extractions. The globes appear normal and symmetric. Symmetric appearance of the extraocular musculature and optic nerve sheath complexes. Normal caliber of the superior ophthalmic veins. Sinuses: Minimal mural thickening in the ethmoid sinuses. Remaining paranasal sinuses are predominantly clear. Mastoid air cells are well aerated. Middle ear cavities are clear. Soft tissues: Minimal soft tissue swelling inferior to the left orbit, correlate with site of reported injury. No other significant soft tissue swelling. No soft tissue gas or foreign body. CT CERVICAL SPINE FINDINGS Alignment: Stabilization collar is not visualized at the time of exam. There is straightening and focal reversal of the normal lordosis centered at C3. Mild anterolisthesis C2 on C3 is likely on a degenerative basis with notable fusion of the left C2-3 facets. No other significant spondylolisthesis. No traumatic listhesis is evident. No abnormally widened, perched or jumped facets. Skull base and vertebrae: No acute fracture. No primary bone lesion or focal pathologic process. Soft tissues and spinal canal: No pre or paravertebral fluid or swelling. No visible canal hematoma. Disc levels: Severe facet degenerative changes noted at the C1-2 lateral masses. Additional mild-to-moderate multilevel cervical spondylitic changes are present most pronounced C5-C7 at most mild canal stenosis at the C5-6 level. Multilevel uncinate spurring and facet hypertrophic changes result in mild-to-moderate multilevel  neural foraminal narrowing without severe foraminal stenosis. Upper chest: No acute abnormality in the upper chest or imaged lung apices. Other:  Dense cervical carotid atherosclerosis. Thyroid tissue appears diminutive, correlate for prior ablation or surgery. IMPRESSION: 1. No acute intracranial findings. 2. Mild parenchymal volume loss and chronic white matter angiopathy. 3. Minimal soft tissue swelling inferior to the left orbit, correlate with site of reported injury. No facial bone fracture. 4. Few carious lesions within the maxillary dentition. Correlate with dental exam. 5. No acute fracture or traumatic listhesis. 6. Cervical spondylitic and facet degenerative changes, as detailed above. 7. Dense cervical carotid atherosclerosis. 8. Diminutive appearance of the thyroid gland, correlate for prior surgery and or ablation. Electronically Signed   By: Lovena Le M.D.   On: 01/08/2020 15:44   DG Chest Port 1 View  Result Date: 01/08/2020 CLINICAL DATA:  Syncope shortness of breath EXAM: PORTABLE CHEST 1 VIEW COMPARISON:  October 20, 2019 FINDINGS: There is mild cardiomegaly. Aortic knob calcifications. The lungs are clear. No focal airspace consolidation or pleural effusion. No acute osseous abnormality. IMPRESSION: No active disease. Electronically Signed   By: Prudencio Pair M.D.   On: 01/08/2020 16:01   MM 3D SCREEN BREAST BILATERAL  Result Date: 12/20/2019 CLINICAL DATA:  Screening. EXAM: DIGITAL SCREENING BILATERAL MAMMOGRAM WITH TOMO AND CAD COMPARISON:  Previous exam(s). ACR Breast Density Category b: There are scattered areas of fibroglandular density. FINDINGS: There are no findings suspicious for malignancy. Post lumpectomy changes are noted on the left including notable fat necrosis calcifications. Images were processed with CAD. IMPRESSION: No mammographic evidence of malignancy. A result letter of this screening mammogram will be mailed directly to the patient. RECOMMENDATION: Screening  mammogram in one year. (Code:SM-B-01Y) BI-RADS CATEGORY  2: Benign. Electronically Signed   By: Lajean Manes M.D.   On: 12/20/2019 07:59   CT MAXILLOFACIAL WO CONTRAST  Result Date: 01/08/2020 CLINICAL DATA:  Fall, bruising beneath the left eye, neck pain with movement EXAM: CT HEAD WITHOUT CONTRAST CT MAXILLOFACIAL WITHOUT CONTRAST CT CERVICAL SPINE WITHOUT CONTRAST TECHNIQUE: Multidetector CT imaging of the head, cervical spine, and maxillofacial structures were performed using the standard protocol without intravenous contrast. Multiplanar CT image reconstructions of the cervical spine and maxillofacial structures were also generated. COMPARISON:  CT head 11/21/2018 FINDINGS: CT HEAD FINDINGS Brain: No evidence of acute infarction, hemorrhage, hydrocephalus, extra-axial collection or mass lesion/mass effect. Symmetric prominence of the ventricles, cisterns and sulci compatible with parenchymal volume loss. Patchy areas of white matter hypoattenuation are most compatible with chronic microvascular angiopathy. Vascular: Atherosclerotic calcification of the carotid siphons. No hyperdense vessel. Skull: No calvarial fracture or suspicious osseous lesion. No scalp swelling or hematoma. Other: None CT MAXILLOFACIAL FINDINGS Osseous: No fracture of the bony orbits. Nasal bones are intact. No mid face fractures are seen. The pterygoid plates are intact. The mandible is intact. Temporomandibular joints are normally aligned. No temporal bone fractures are identified. No fractured or avulsed teeth. Few carious lesions within the maxillary dentition. Orbits: Some mild left infraorbital soft tissue swelling. No retro septal gas, stranding or hemorrhage. Prior bilateral lens extractions. The globes appear normal and symmetric. Symmetric appearance of the extraocular musculature and optic nerve sheath complexes. Normal caliber of the superior ophthalmic veins. Sinuses: Minimal mural thickening in the ethmoid sinuses.  Remaining paranasal sinuses are predominantly clear. Mastoid air cells are well aerated. Middle ear cavities are clear. Soft tissues: Minimal soft tissue swelling inferior to the left orbit, correlate with site of reported injury. No other significant soft tissue swelling. No soft tissue gas or foreign body. CT CERVICAL SPINE FINDINGS Alignment: Stabilization collar is not  visualized at the time of exam. There is straightening and focal reversal of the normal lordosis centered at C3. Mild anterolisthesis C2 on C3 is likely on a degenerative basis with notable fusion of the left C2-3 facets. No other significant spondylolisthesis. No traumatic listhesis is evident. No abnormally widened, perched or jumped facets. Skull base and vertebrae: No acute fracture. No primary bone lesion or focal pathologic process. Soft tissues and spinal canal: No pre or paravertebral fluid or swelling. No visible canal hematoma. Disc levels: Severe facet degenerative changes noted at the C1-2 lateral masses. Additional mild-to-moderate multilevel cervical spondylitic changes are present most pronounced C5-C7 at most mild canal stenosis at the C5-6 level. Multilevel uncinate spurring and facet hypertrophic changes result in mild-to-moderate multilevel neural foraminal narrowing without severe foraminal stenosis. Upper chest: No acute abnormality in the upper chest or imaged lung apices. Other: Dense cervical carotid atherosclerosis. Thyroid tissue appears diminutive, correlate for prior ablation or surgery. IMPRESSION: 1. No acute intracranial findings. 2. Mild parenchymal volume loss and chronic white matter angiopathy. 3. Minimal soft tissue swelling inferior to the left orbit, correlate with site of reported injury. No facial bone fracture. 4. Few carious lesions within the maxillary dentition. Correlate with dental exam. 5. No acute fracture or traumatic listhesis. 6. Cervical spondylitic and facet degenerative changes, as detailed  above. 7. Dense cervical carotid atherosclerosis. 8. Diminutive appearance of the thyroid gland, correlate for prior surgery and or ablation. Electronically Signed   By: Lovena Le M.D.   On: 01/08/2020 15:44    ECG & Cardiac Imaging    EMS ECG appears to show slow A. fib at a rate of 39 bpm with follow-up ECG approximately 5 minutes later showing a more regular rhythm, likely junctional bradycardia versus sinus rhythm (poor baseline). Initial ED ECG at 14: 25-junctional bradycardia, 59, LBBB - personally reviewed. Follow-up ED ECG at 1634 -A. fib, 66, left bundle branch block  Assessment & Plan    1.  Syncope/bradycardia: Patient suffered a syncopal spell while walking in her cafeteria which was preceded by presyncope and dyspnea.  She did not experience nausea, vomiting, diaphoresis, or chest pain either preceding or following syncopal event.  EMS ECGs show slow A. fib with rates in the 30s followed by junctional rhythm.  ER ECGs appear to show junctional bradycardia as well as A. fib in the 60s on follow-up.  She has a history of very small P wave amplitude which is easily obscured by baseline artifact.  She does have a history of bradycardia on beta-blocker therapy previously, which resulted in its discontinuation, but low-dose metoprolol succinate 12.5 mg daily was resumed in January 2021 in the setting of atypical chest pain and frequent PACs and PVCs.  Beta-blocker therapy has been held since admission and on telemetry, she has been in sinus rhythm with occasional PACs and PVCs.  No prolonged pauses.  She has no recurrence of presyncope or syncope.  Lab work unremarkable with normal troponins.  Recent echo in January showed normal LV function.  Recommend ambulation with monitoring of heart rate excursion.  I will arrange for placement of a 2-week ZIO monitor in order to continue to evaluate heart rate and rhythm as an outpatient.  2.  Asymptomatic PVCs: Noted in January 2021 and treated with  low-dose beta-blocker however, this has been discontinued.  She has had occasional PVCs and several couplets, all of which have been asymptomatic.  Plan and outpatient monitoring as above.  Will be able to quantify PVCs  on monitoring.  3.  Essential hypertension: Stable on lisinopril therapy.  Continue to avoid AV nodal blocking agents.  4.  Hyperlipidemia: She remains on statin therapy with an LDL of 103 in January.  5.  Paroxysmal atrial fibrillation: Initial EMS ECG appears to show slow A. fib at a rate of 39 bpm.  A. fib once again noted on follow-up ECG just after 4:30 PM yesterday.  She has been maintaining sinus rhythm on telemetry since admission.  Monitoring as above to determine burden of A. fib and whether or not this contributes to any symptoms.  6. LBBB: Notes indicate prior history of intermittent left bundle branch block.  No chest pain, normal troponins, and recent normal LV function by echo in January 2021.  Signed, Murray Hodgkins, NP 01/09/2020, 12:57 PM  For questions or updates, please contact   Please consult [email or web address] for contact info under Cardiology/STEMI.

## 2020-01-09 NOTE — Progress Notes (Signed)
PROGRESS NOTE    Kerri Mills  [id number] DOB: 03/08/1931 DOA: 01/08/2020 PCP: Einar Pheasant, MD    Assessment & Plan:   Active Problems:   Syncope    Kerri Mills is a 86 y.o. Caucasian female with medical history significant of Afib on Eliquis, hypothyroidism, HTN who presented from [facility] after an syncopal event.    # Syncope 2/2 sick sinus syndrome/arrhythmia --EMS found pt's HR in 30's on initial arrival.  EKG in the ED showed new LBBB with rate of 50's.  Pt took Toprol 12.5 mg daily the morning prior to arrival. PLAN: --Hold home Toprol --Monitor on tele --Cardiology consult today, will place Zio Patch  # Hx of Afib on Eliquis, rate controlled --Hold home Toprol due to bradycardia --continue home Eliquis  # HTN --continue home Lisinopril 10 mg daily  # Hypothyroidism --TSH wnl --continue home Synthroid  # HLD --continue statin as pravastatin  # GERD --continue PPI  # Depression --continue home Cymbalta   DVT prophylaxis: [id number] Code Status: Full code  Family Communication:  Disposition Plan: [facility] tomorrow.  Cardiology asked to monitor for another night and have pt work with PT prior to discharge.   Subjective and Interval History:  Pt reported feeling better today, body not as sore.  No fever, dyspnea, chest pain, abdominal pain, N/V/D, dysuria.   Objective: Vitals:   01/08/20 2011 01/09/20 0120 01/09/20 0430 01/09/20 0750  BP: (!) 161/86 (!) 108/52 123/74 114/72  Pulse: 64 (!) 51 60 62  Resp: 18 18 16 16   Temp: 97.7 F (36.5 C) 98.3 F (36.8 C) 98.3 F (36.8 C)   TempSrc: Oral Oral Oral   SpO2: 94% 96% 95% 99%  Weight:      Height:        Intake/Output Summary (Last 24 hours) at 01/09/2020 2015 Last data filed at 01/09/2020 1845 Gross per 24 hour  Intake 720 ml  Output 0 ml  Net 720 ml   Filed Weights   01/08/20 1423  Weight: 81.6 kg    Examination:   Constitutional: NAD,  AAOx3 HEENT: conjunctivae and lids normal, EOMI CV: RRR no M,R,G. Distal pulses +2.  No cyanosis.   RESP: CTA B/L, normal respiratory effort  GI: +BS, NTND Extremities: No effusions, edema in BLE SKIN: warm, dry and intact Neuro: II - XII grossly intact.  Sensation intact Psych: Normal mood and affect.  Appropriate judgement and reason   Data Reviewed: I have personally reviewed following labs and imaging studies  CBC: Recent Labs  Lab 01/08/20 1430 01/09/20 0358  WBC 6.0 6.6  NEUTROABS 4.0  --   HGB 12.7 12.2  HCT 39.8 37.7  MCV 94.3 94.5  PLT 206 [id number]   Basic Metabolic Panel: Recent Labs  Lab 01/08/20 1430 01/09/20 0358  NA 140 142  K 4.2 3.9  CL 105 105  CO2 26 29  GLUCOSE 160* 115*  BUN 16 17  CREATININE 0.97 0.97  CALCIUM 9.7 9.5  MG 2.0 2.3   GFR: Estimated Creatinine Clearance: 43.9 mL/min (by C-G formula based on SCr of 0.97 mg/dL). Liver Function Tests: Recent Labs  Lab 01/08/20 1430  AST 20  ALT 15  ALKPHOS 111  BILITOT 0.9  PROT 6.6  ALBUMIN 3.8   No results for input(s): LIPASE, AMYLASE in the last 168 hours. No results for input(s): AMMONIA in the last 168 hours. Coagulation Profile: No results for input(s): INR, PROTIME in the last 168 hours.  Cardiac Enzymes: No results for input(s): CKTOTAL, CKMB, CKMBINDEX, TROPONINI in the last 168 hours. BNP (last 3 results) No results for input(s): PROBNP in the last 8760 hours. HbA1C: No results for input(s): HGBA1C in the last 72 hours. CBG: No results for input(s): GLUCAP in the last 168 hours. Lipid Profile: No results for input(s): CHOL, HDL, LDLCALC, TRIG, CHOLHDL, LDLDIRECT in the last 72 hours. Thyroid Function Tests: Recent Labs    01/08/20 1430  TSH 1.439   Anemia Panel: No results for input(s): VITAMINB12, FOLATE, FERRITIN, TIBC, IRON, RETICCTPCT in the last 72 hours. Sepsis Labs: No results for input(s): PROCALCITON, LATICACIDVEN in the last 168 hours.  Recent Results (from  the past 240 hour(s))  Respiratory Panel by RT PCR (Flu A&B, Covid) - Nasopharyngeal Swab     Status: None   Collection Time: 01/08/20  9:02 PM   Specimen: Nasopharyngeal Swab  Result Value Ref Range Status   SARS Coronavirus 2 by RT PCR NEGATIVE NEGATIVE Final    Comment: (NOTE) SARS-CoV-2 target nucleic acids are NOT DETECTED. The SARS-CoV-2 RNA is generally detectable in upper respiratoy specimens during the acute phase of infection. The lowest concentration of SARS-CoV-2 viral copies this assay can detect is 131 copies/mL. A negative result does not preclude SARS-Cov-2 infection and should not be used as the sole basis for treatment or other patient management decisions. A negative result may occur with  improper specimen collection/handling, submission of specimen other than nasopharyngeal swab, presence of viral mutation(s) within the areas targeted by this assay, and inadequate number of viral copies (<131 copies/mL). A negative result must be combined with clinical observations, patient history, and epidemiological information. The expected result is Negative. Fact Sheet for Patients:  PinkCheek.be Fact Sheet for Healthcare Providers:  GravelBags.it This test is not yet ap proved or cleared by the [place] FDA and  has been authorized for detection and/or diagnosis of SARS-CoV-2 by FDA under an Emergency Use Authorization (EUA). This EUA will remain  in effect (meaning this test can be used) for the duration of the COVID-19 declaration under Section 564(b)(1) of the Act, 21 U.S.C. section 360bbb-3(b)(1), unless the authorization is terminated or revoked sooner.    Influenza A by PCR NEGATIVE NEGATIVE Final   Influenza B by PCR NEGATIVE NEGATIVE Final    Comment: (NOTE) The Xpert Xpress SARS-CoV-2/FLU/RSV assay is intended as an aid in  the diagnosis of influenza from Nasopharyngeal swab specimens and  should  not be used as a sole basis for treatment. Nasal washings and  aspirates are unacceptable for Xpert Xpress SARS-CoV-2/FLU/RSV  testing. Fact Sheet for Patients: PinkCheek.be Fact Sheet for Healthcare Providers: GravelBags.it This test is not yet approved or cleared by the [place] FDA and  has been authorized for detection and/or diagnosis of SARS-CoV-2 by  FDA under an Emergency Use Authorization (EUA). This EUA will remain  in effect (meaning this test can be used) for the duration of the  Covid-19 declaration under Section 564(b)(1) of the Act, 21  U.S.C. section 360bbb-3(b)(1), unless the authorization is  terminated or revoked. Performed at [facility], [address] [place], [place] [address]       Radiology Studies: DG Tibia/Fibula Left  Result Date: 01/08/2020 CLINICAL DATA:  [place]. Injured left leg. EXAM: LEFT TIBIA AND FIBULA - 2 VIEW COMPARISON:  None. FINDINGS: The knee and ankle joints are maintained. Moderate degenerative changes are noted at the knee joint. The tibia and fibula are intact. No acute fracture  is identified. IMPRESSION: No acute fracture. Electronically Signed   By: Marijo Sanes M.D.   On: 01/08/2020 15:58   CT Head Wo Contrast  Result Date: 01/08/2020 CLINICAL DATA:  Fall, bruising beneath the left eye, neck pain with movement EXAM: CT HEAD WITHOUT CONTRAST CT MAXILLOFACIAL WITHOUT CONTRAST CT CERVICAL SPINE WITHOUT CONTRAST TECHNIQUE: Multidetector CT imaging of the head, cervical spine, and maxillofacial structures were performed using the standard protocol without intravenous contrast. Multiplanar CT image reconstructions of the cervical spine and maxillofacial structures were also generated. COMPARISON:  CT head 11/21/2018 FINDINGS: CT HEAD FINDINGS Brain: No evidence of acute infarction, hemorrhage, hydrocephalus, extra-axial collection or mass lesion/mass effect. Symmetric  prominence of the ventricles, cisterns and sulci compatible with parenchymal volume loss. Patchy areas of white matter hypoattenuation are most compatible with chronic microvascular angiopathy. Vascular: Atherosclerotic calcification of the carotid siphons. No hyperdense vessel. Skull: No calvarial fracture or suspicious osseous lesion. No scalp swelling or hematoma. Other: None CT MAXILLOFACIAL FINDINGS Osseous: No fracture of the bony orbits. Nasal bones are intact. No mid face fractures are seen. The pterygoid plates are intact. The mandible is intact. Temporomandibular joints are normally aligned. No temporal bone fractures are identified. No fractured or avulsed teeth. Few carious lesions within the maxillary dentition. Orbits: Some mild left infraorbital soft tissue swelling. No retro septal gas, stranding or hemorrhage. Prior bilateral lens extractions. The globes appear normal and symmetric. Symmetric appearance of the extraocular musculature and optic nerve sheath complexes. Normal caliber of the superior ophthalmic veins. Sinuses: Minimal mural thickening in the ethmoid sinuses. Remaining paranasal sinuses are predominantly clear. Mastoid air cells are well aerated. Middle ear cavities are clear. Soft tissues: Minimal soft tissue swelling inferior to the left orbit, correlate with site of reported injury. No other significant soft tissue swelling. No soft tissue gas or foreign body. CT CERVICAL SPINE FINDINGS Alignment: Stabilization collar is not visualized at the time of exam. There is straightening and focal reversal of the normal lordosis centered at C3. Mild anterolisthesis C2 on C3 is likely on a degenerative basis with notable fusion of the left C2-3 facets. No other significant spondylolisthesis. No traumatic listhesis is evident. No abnormally widened, perched or jumped facets. Skull base and vertebrae: No acute fracture. No primary bone lesion or focal pathologic process. Soft tissues and spinal  canal: No pre or paravertebral fluid or swelling. No visible canal hematoma. Disc levels: Severe facet degenerative changes noted at the C1-2 lateral masses. Additional mild-to-moderate multilevel cervical spondylitic changes are present most pronounced C5-C7 at most mild canal stenosis at the C5-6 level. Multilevel uncinate spurring and facet hypertrophic changes result in mild-to-moderate multilevel neural foraminal narrowing without severe foraminal stenosis. Upper chest: No acute abnormality in the upper chest or imaged lung apices. Other: Dense cervical carotid atherosclerosis. Thyroid tissue appears diminutive, correlate for prior ablation or surgery. IMPRESSION: 1. No acute intracranial findings. 2. Mild parenchymal volume loss and chronic white matter angiopathy. 3. Minimal soft tissue swelling inferior to the left orbit, correlate with site of reported injury. No facial bone fracture. 4. Few carious lesions within the maxillary dentition. Correlate with dental exam. 5. No acute fracture or traumatic listhesis. 6. Cervical spondylitic and facet degenerative changes, as detailed above. 7. Dense cervical carotid atherosclerosis. 8. Diminutive appearance of the thyroid gland, correlate for prior surgery and or ablation. Electronically Signed   By: Lovena Le M.D.   On: 01/08/2020 15:44   CT CERVICAL SPINE WO CONTRAST  Result Date: 01/08/2020 CLINICAL  DATA:  Fall, bruising beneath the left eye, neck pain with movement EXAM: CT HEAD WITHOUT CONTRAST CT MAXILLOFACIAL WITHOUT CONTRAST CT CERVICAL SPINE WITHOUT CONTRAST TECHNIQUE: Multidetector CT imaging of the head, cervical spine, and maxillofacial structures were performed using the standard protocol without intravenous contrast. Multiplanar CT image reconstructions of the cervical spine and maxillofacial structures were also generated. COMPARISON:  CT head 11/21/2018 FINDINGS: CT HEAD FINDINGS Brain: No evidence of acute infarction, hemorrhage,  hydrocephalus, extra-axial collection or mass lesion/mass effect. Symmetric prominence of the ventricles, cisterns and sulci compatible with parenchymal volume loss. Patchy areas of white matter hypoattenuation are most compatible with chronic microvascular angiopathy. Vascular: Atherosclerotic calcification of the carotid siphons. No hyperdense vessel. Skull: No calvarial fracture or suspicious osseous lesion. No scalp swelling or hematoma. Other: None CT MAXILLOFACIAL FINDINGS Osseous: No fracture of the bony orbits. Nasal bones are intact. No mid face fractures are seen. The pterygoid plates are intact. The mandible is intact. Temporomandibular joints are normally aligned. No temporal bone fractures are identified. No fractured or avulsed teeth. Few carious lesions within the maxillary dentition. Orbits: Some mild left infraorbital soft tissue swelling. No retro septal gas, stranding or hemorrhage. Prior bilateral lens extractions. The globes appear normal and symmetric. Symmetric appearance of the extraocular musculature and optic nerve sheath complexes. Normal caliber of the superior ophthalmic veins. Sinuses: Minimal mural thickening in the ethmoid sinuses. Remaining paranasal sinuses are predominantly clear. Mastoid air cells are well aerated. Middle ear cavities are clear. Soft tissues: Minimal soft tissue swelling inferior to the left orbit, correlate with site of reported injury. No other significant soft tissue swelling. No soft tissue gas or foreign body. CT CERVICAL SPINE FINDINGS Alignment: Stabilization collar is not visualized at the time of exam. There is straightening and focal reversal of the normal lordosis centered at C3. Mild anterolisthesis C2 on C3 is likely on a degenerative basis with notable fusion of the left C2-3 facets. No other significant spondylolisthesis. No traumatic listhesis is evident. No abnormally widened, perched or jumped facets. Skull base and vertebrae: No acute fracture.  No primary bone lesion or focal pathologic process. Soft tissues and spinal canal: No pre or paravertebral fluid or swelling. No visible canal hematoma. Disc levels: Severe facet degenerative changes noted at the C1-2 lateral masses. Additional mild-to-moderate multilevel cervical spondylitic changes are present most pronounced C5-C7 at most mild canal stenosis at the C5-6 level. Multilevel uncinate spurring and facet hypertrophic changes result in mild-to-moderate multilevel neural foraminal narrowing without severe foraminal stenosis. Upper chest: No acute abnormality in the upper chest or imaged lung apices. Other: Dense cervical carotid atherosclerosis. Thyroid tissue appears diminutive, correlate for prior ablation or surgery. IMPRESSION: 1. No acute intracranial findings. 2. Mild parenchymal volume loss and chronic white matter angiopathy. 3. Minimal soft tissue swelling inferior to the left orbit, correlate with site of reported injury. No facial bone fracture. 4. Few carious lesions within the maxillary dentition. Correlate with dental exam. 5. No acute fracture or traumatic listhesis. 6. Cervical spondylitic and facet degenerative changes, as detailed above. 7. Dense cervical carotid atherosclerosis. 8. Diminutive appearance of the thyroid gland, correlate for prior surgery and or ablation. Electronically Signed   By: Lovena Le M.D.   On: 01/08/2020 15:44   DG Chest Port 1 View  Result Date: 01/08/2020 CLINICAL DATA:  Syncope shortness of breath EXAM: PORTABLE CHEST 1 VIEW COMPARISON:  October 20, 2019 FINDINGS: There is mild cardiomegaly. Aortic knob calcifications. The lungs are clear. No focal airspace  consolidation or pleural effusion. No acute osseous abnormality. IMPRESSION: No active disease. Electronically Signed   By: Prudencio Pair M.D.   On: 01/08/2020 16:01   CT MAXILLOFACIAL WO CONTRAST  Result Date: 01/08/2020 CLINICAL DATA:  Fall, bruising beneath the left eye, neck pain with  movement EXAM: CT HEAD WITHOUT CONTRAST CT MAXILLOFACIAL WITHOUT CONTRAST CT CERVICAL SPINE WITHOUT CONTRAST TECHNIQUE: Multidetector CT imaging of the head, cervical spine, and maxillofacial structures were performed using the standard protocol without intravenous contrast. Multiplanar CT image reconstructions of the cervical spine and maxillofacial structures were also generated. COMPARISON:  CT head 11/21/2018 FINDINGS: CT HEAD FINDINGS Brain: No evidence of acute infarction, hemorrhage, hydrocephalus, extra-axial collection or mass lesion/mass effect. Symmetric prominence of the ventricles, cisterns and sulci compatible with parenchymal volume loss. Patchy areas of white matter hypoattenuation are most compatible with chronic microvascular angiopathy. Vascular: Atherosclerotic calcification of the carotid siphons. No hyperdense vessel. Skull: No calvarial fracture or suspicious osseous lesion. No scalp swelling or hematoma. Other: None CT MAXILLOFACIAL FINDINGS Osseous: No fracture of the bony orbits. Nasal bones are intact. No mid face fractures are seen. The pterygoid plates are intact. The mandible is intact. Temporomandibular joints are normally aligned. No temporal bone fractures are identified. No fractured or avulsed teeth. Few carious lesions within the maxillary dentition. Orbits: Some mild left infraorbital soft tissue swelling. No retro septal gas, stranding or hemorrhage. Prior bilateral lens extractions. The globes appear normal and symmetric. Symmetric appearance of the extraocular musculature and optic nerve sheath complexes. Normal caliber of the superior ophthalmic veins. Sinuses: Minimal mural thickening in the ethmoid sinuses. Remaining paranasal sinuses are predominantly clear. Mastoid air cells are well aerated. Middle ear cavities are clear. Soft tissues: Minimal soft tissue swelling inferior to the left orbit, correlate with site of reported injury. No other significant soft tissue  swelling. No soft tissue gas or foreign body. CT CERVICAL SPINE FINDINGS Alignment: Stabilization collar is not visualized at the time of exam. There is straightening and focal reversal of the normal lordosis centered at C3. Mild anterolisthesis C2 on C3 is likely on a degenerative basis with notable fusion of the left C2-3 facets. No other significant spondylolisthesis. No traumatic listhesis is evident. No abnormally widened, perched or jumped facets. Skull base and vertebrae: No acute fracture. No primary bone lesion or focal pathologic process. Soft tissues and spinal canal: No pre or paravertebral fluid or swelling. No visible canal hematoma. Disc levels: Severe facet degenerative changes noted at the C1-2 lateral masses. Additional mild-to-moderate multilevel cervical spondylitic changes are present most pronounced C5-C7 at most mild canal stenosis at the C5-6 level. Multilevel uncinate spurring and facet hypertrophic changes result in mild-to-moderate multilevel neural foraminal narrowing without severe foraminal stenosis. Upper chest: No acute abnormality in the upper chest or imaged lung apices. Other: Dense cervical carotid atherosclerosis. Thyroid tissue appears diminutive, correlate for prior ablation or surgery. IMPRESSION: 1. No acute intracranial findings. 2. Mild parenchymal volume loss and chronic white matter angiopathy. 3. Minimal soft tissue swelling inferior to the left orbit, correlate with site of reported injury. No facial bone fracture. 4. Few carious lesions within the maxillary dentition. Correlate with dental exam. 5. No acute fracture or traumatic listhesis. 6. Cervical spondylitic and facet degenerative changes, as detailed above. 7. Dense cervical carotid atherosclerosis. 8. Diminutive appearance of the thyroid gland, correlate for prior surgery and or ablation. Electronically Signed   By: Lovena Le M.D.   On: 01/08/2020 15:44     Scheduled Meds: .  apixaban  5 mg Oral BID  .  DULoxetine  60 mg Oral QHS  . gabapentin  600 mg Oral QHS  . levothyroxine  88 mcg Oral Q0600  . lisinopril  10 mg Oral Daily  . pantoprazole  40 mg Oral Daily  . pravastatin  40 mg Oral q1800   Continuous Infusions:   LOS: 0 days     Enzo Bi, MD [facility] If 7PM-7AM, please contact night-coverage 01/09/2020, 8:15 PM

## 2020-01-10 ENCOUNTER — Observation Stay (HOSPITAL_COMMUNITY)
Admission: AD | Admit: 2020-01-10 | Discharge: 2020-01-11 | Disposition: A | Discharge status: Home / Self Care | Payer: Medicare Other | Source: Other Acute Inpatient Hospital | Attending: Cardiology | Admitting: Cardiology

## 2020-01-10 ENCOUNTER — Ambulatory Visit (HOSPITAL_COMMUNITY)
Admission: AD | Disposition: A | Discharge status: Home / Self Care | Payer: Self-pay | Source: Other Acute Inpatient Hospital | Attending: Cardiology

## 2020-01-10 DIAGNOSIS — E7800 Pure hypercholesterolemia, unspecified: Secondary | ICD-10-CM | POA: Diagnosis not present

## 2020-01-10 DIAGNOSIS — R55 Syncope and collapse: Secondary | ICD-10-CM | POA: Diagnosis not present

## 2020-01-10 DIAGNOSIS — Z803 Family history of malignant neoplasm of breast: Secondary | ICD-10-CM | POA: Diagnosis not present

## 2020-01-10 DIAGNOSIS — I10 Essential (primary) hypertension: Secondary | ICD-10-CM | POA: Diagnosis not present

## 2020-01-10 DIAGNOSIS — Z8249 Family history of ischemic heart disease and other diseases of the circulatory system: Secondary | ICD-10-CM | POA: Diagnosis not present

## 2020-01-10 DIAGNOSIS — Z85850 Personal history of malignant neoplasm of thyroid: Secondary | ICD-10-CM | POA: Diagnosis not present

## 2020-01-10 DIAGNOSIS — F329 Major depressive disorder, single episode, unspecified: Secondary | ICD-10-CM | POA: Insufficient documentation

## 2020-01-10 DIAGNOSIS — I471 Supraventricular tachycardia: Secondary | ICD-10-CM | POA: Diagnosis present

## 2020-01-10 DIAGNOSIS — I459 Conduction disorder, unspecified: Secondary | ICD-10-CM | POA: Diagnosis not present

## 2020-01-10 DIAGNOSIS — K219 Gastro-esophageal reflux disease without esophagitis: Secondary | ICD-10-CM | POA: Insufficient documentation

## 2020-01-10 DIAGNOSIS — Z923 Personal history of irradiation: Secondary | ICD-10-CM | POA: Insufficient documentation

## 2020-01-10 DIAGNOSIS — I4819 Other persistent atrial fibrillation: Secondary | ICD-10-CM | POA: Diagnosis not present

## 2020-01-10 DIAGNOSIS — Z86718 Personal history of other venous thrombosis and embolism: Secondary | ICD-10-CM | POA: Insufficient documentation

## 2020-01-10 DIAGNOSIS — I4891 Unspecified atrial fibrillation: Secondary | ICD-10-CM | POA: Diagnosis present

## 2020-01-10 DIAGNOSIS — Z95818 Presence of other cardiac implants and grafts: Secondary | ICD-10-CM

## 2020-01-10 DIAGNOSIS — I495 Sick sinus syndrome: Principal | ICD-10-CM

## 2020-01-10 DIAGNOSIS — Y9301 Activity, walking, marching and hiking: Secondary | ICD-10-CM | POA: Diagnosis present

## 2020-01-10 DIAGNOSIS — I872 Venous insufficiency (chronic) (peripheral): Secondary | ICD-10-CM | POA: Diagnosis not present

## 2020-01-10 DIAGNOSIS — Z7901 Long term (current) use of anticoagulants: Secondary | ICD-10-CM | POA: Insufficient documentation

## 2020-01-10 DIAGNOSIS — I480 Paroxysmal atrial fibrillation: Secondary | ICD-10-CM | POA: Diagnosis not present

## 2020-01-10 DIAGNOSIS — F419 Anxiety disorder, unspecified: Secondary | ICD-10-CM | POA: Diagnosis not present

## 2020-01-10 DIAGNOSIS — Z8711 Personal history of peptic ulcer disease: Secondary | ICD-10-CM | POA: Diagnosis not present

## 2020-01-10 DIAGNOSIS — R001 Bradycardia, unspecified: Secondary | ICD-10-CM

## 2020-01-10 DIAGNOSIS — W1830XA Fall on same level, unspecified, initial encounter: Secondary | ICD-10-CM | POA: Diagnosis present

## 2020-01-10 DIAGNOSIS — Z79890 Hormone replacement therapy (postmenopausal): Secondary | ICD-10-CM | POA: Diagnosis not present

## 2020-01-10 DIAGNOSIS — I447 Left bundle-branch block, unspecified: Secondary | ICD-10-CM | POA: Diagnosis not present

## 2020-01-10 DIAGNOSIS — E039 Hypothyroidism, unspecified: Secondary | ICD-10-CM | POA: Diagnosis present

## 2020-01-10 DIAGNOSIS — Z950 Presence of cardiac pacemaker: Secondary | ICD-10-CM

## 2020-01-10 DIAGNOSIS — E785 Hyperlipidemia, unspecified: Secondary | ICD-10-CM | POA: Insufficient documentation

## 2020-01-10 DIAGNOSIS — Z79899 Other long term (current) drug therapy: Secondary | ICD-10-CM | POA: Insufficient documentation

## 2020-01-10 DIAGNOSIS — G4733 Obstructive sleep apnea (adult) (pediatric): Secondary | ICD-10-CM | POA: Diagnosis not present

## 2020-01-10 DIAGNOSIS — R4020 Unspecified coma: Secondary | ICD-10-CM

## 2020-01-10 DIAGNOSIS — I493 Ventricular premature depolarization: Secondary | ICD-10-CM | POA: Diagnosis present

## 2020-01-10 DIAGNOSIS — Z880 Allergy status to penicillin: Secondary | ICD-10-CM | POA: Diagnosis not present

## 2020-01-10 DIAGNOSIS — Z9049 Acquired absence of other specified parts of digestive tract: Secondary | ICD-10-CM | POA: Diagnosis not present

## 2020-01-10 DIAGNOSIS — Z853 Personal history of malignant neoplasm of breast: Secondary | ICD-10-CM | POA: Diagnosis not present

## 2020-01-10 DIAGNOSIS — Y92128 Other place in nursing home as the place of occurrence of the external cause: Secondary | ICD-10-CM | POA: Diagnosis not present

## 2020-01-10 DIAGNOSIS — Z85820 Personal history of malignant melanoma of skin: Secondary | ICD-10-CM | POA: Diagnosis not present

## 2020-01-10 DIAGNOSIS — Z90711 Acquired absence of uterus with remaining cervical stump: Secondary | ICD-10-CM | POA: Diagnosis not present

## 2020-01-10 DIAGNOSIS — W19XXXA Unspecified fall, initial encounter: Secondary | ICD-10-CM

## 2020-01-10 DIAGNOSIS — Z20822 Contact with and (suspected) exposure to covid-19: Secondary | ICD-10-CM | POA: Diagnosis present

## 2020-01-10 DIAGNOSIS — Z888 Allergy status to other drugs, medicaments and biological substances status: Secondary | ICD-10-CM | POA: Diagnosis not present

## 2020-01-10 HISTORY — PX: PACEMAKER IMPLANT: EP1218

## 2020-01-10 HISTORY — DX: Presence of cardiac pacemaker: Z95.0

## 2020-01-10 LAB — BASIC METABOLIC PANEL
Anion gap: 11 (ref 5–15)
BUN: 13 mg/dL (ref 8–23)
CO2: 26 mmol/L (ref 22–32)
Calcium: 9.7 mg/dL (ref 8.9–10.3)
Chloride: 104 mmol/L (ref 98–111)
Creatinine, Ser: 0.74 mg/dL (ref 0.44–1.00)
GFR calc Af Amer: >60 mL/min (ref >60)
GFR calc non Af Amer: >60 mL/min (ref >60)
Glucose, Bld: 119 mg/dL — ABNORMAL HIGH (ref 70–99)
Potassium: 4 mmol/L (ref 3.5–5.1)
Sodium: 141 mmol/L (ref 135–145)

## 2020-01-10 LAB — CBC
HCT: 40.6 % (ref 36.0–46.0)
Hemoglobin: 13 g/dL (ref 12.0–15.0)
MCH: 30.5 pg (ref 26.0–34.0)
MCHC: 32 g/dL (ref 30.0–36.0)
MCV: 95.3 fL (ref 80.0–100.0)
Platelets: 210 10*3/uL (ref 150–400)
RBC: 4.26 MIL/uL (ref 3.87–5.11)
RDW: 12.4 % (ref 11.5–15.5)
WBC: 6.7 10*3/uL (ref 4.0–10.5)
nRBC: 0 % (ref 0.0–0.2)

## 2020-01-10 LAB — MAGNESIUM: Magnesium: 2.1 mg/dL (ref 1.7–2.4)

## 2020-01-10 SURGERY — PACEMAKER IMPLANT

## 2020-01-10 MED ORDER — METOPROLOL SUCCINATE ER 25 MG PO TB24
25.0000 mg | ORAL_TABLET | Freq: Every day | ORAL | Status: DC
  Administered 2020-01-10: 25 mg via ORAL
  Filled 2020-01-10 (×2): qty 1

## 2020-01-10 MED ORDER — LEVOTHYROXINE SODIUM 88 MCG PO TABS
88.0000 ug | ORAL_TABLET | Freq: Every day | ORAL | Status: DC
  Administered 2020-01-11: 88 ug via ORAL
  Filled 2020-01-10: qty 1

## 2020-01-10 MED ORDER — SODIUM CHLORIDE 0.9% FLUSH: 3.0000 mL | INTRAVENOUS | Status: DC | PRN

## 2020-01-10 MED ORDER — VANCOMYCIN HCL IN DEXTROSE 1-5 GM/200ML-% IV SOLN
1000.0000 mg | Freq: Two times a day (BID) | INTRAVENOUS | Status: AC
  Administered 2020-01-11: 1000 mg via INTRAVENOUS
  Filled 2020-01-10: qty 200

## 2020-01-10 MED ORDER — ACETAMINOPHEN 325 MG PO TABS
650.0000 mg | ORAL_TABLET | ORAL | Status: DC | PRN
  Administered 2020-01-10 (×2): 650 mg via ORAL
  Filled 2020-01-10 (×3): qty 2

## 2020-01-10 MED ORDER — ONDANSETRON HCL 4 MG/2ML IJ SOLN: 4.0000 mg | Freq: Four times a day (QID) | INTRAMUSCULAR | Status: DC | PRN

## 2020-01-10 MED ORDER — PANTOPRAZOLE SODIUM 40 MG PO TBEC
40.0000 mg | DELAYED_RELEASE_TABLET | Freq: Every day | ORAL | Status: DC
  Filled 2020-01-10: qty 1

## 2020-01-10 MED ORDER — SODIUM CHLORIDE 0.9 % IV SOLN: INTRAVENOUS | Status: DC

## 2020-01-10 MED ORDER — GABAPENTIN 300 MG PO CAPS
600.0000 mg | ORAL_CAPSULE | Freq: Every day | ORAL | Status: DC
  Administered 2020-01-10: 600 mg via ORAL
  Filled 2020-01-10: qty 2

## 2020-01-10 MED ORDER — VANCOMYCIN HCL IN DEXTROSE 1-5 GM/200ML-% IV SOLN
1000.0000 mg | INTRAVENOUS | Status: AC
  Administered 2020-01-10: 1000 mg via INTRAVENOUS

## 2020-01-10 MED ORDER — NITROGLYCERIN 0.4 MG SL SUBL: 0.4000 mg | SUBLINGUAL_TABLET | SUBLINGUAL | Status: DC | PRN

## 2020-01-10 MED ORDER — OXYCODONE-ACETAMINOPHEN 5-325 MG PO TABS
1.0000 | ORAL_TABLET | Freq: Four times a day (QID) | ORAL | Status: DC | PRN
  Administered 2020-01-10 – 2020-01-11 (×2): 1 via ORAL
  Filled 2020-01-10 (×2): qty 1

## 2020-01-10 MED ORDER — LIDOCAINE HCL (PF) 1 % IJ SOLN
INTRAMUSCULAR | Status: DC | PRN
  Administered 2020-01-10: 60 mL

## 2020-01-10 MED ORDER — CHLORHEXIDINE GLUCONATE 4 % EX LIQD
4.0000 "application " | Freq: Once | CUTANEOUS | Status: DC
  Filled 2020-01-10: qty 60

## 2020-01-10 MED ORDER — SODIUM CHLORIDE 0.9 % IV SOLN: 250.0000 mL | INTRAVENOUS | Status: DC

## 2020-01-10 MED ORDER — MIDAZOLAM HCL 5 MG/5ML IJ SOLN
INTRAMUSCULAR | Status: DC | PRN
  Administered 2020-01-10 (×2): 1 mg via INTRAVENOUS

## 2020-01-10 MED ORDER — LISINOPRIL 10 MG PO TABS
10.0000 mg | ORAL_TABLET | Freq: Every day | ORAL | Status: DC
  Filled 2020-01-10: qty 1

## 2020-01-10 MED ORDER — DULOXETINE HCL 60 MG PO CPEP
60.0000 mg | ORAL_CAPSULE | Freq: Every day | ORAL | Status: DC
  Administered 2020-01-10: 60 mg via ORAL
  Filled 2020-01-10: qty 1

## 2020-01-10 MED ORDER — SODIUM CHLORIDE 0.9% FLUSH
3.0000 mL | Freq: Two times a day (BID) | INTRAVENOUS | Status: DC
  Administered 2020-01-10 – 2020-01-11 (×2): 3 mL via INTRAVENOUS

## 2020-01-10 MED ORDER — SODIUM CHLORIDE 0.9 % IV SOLN
80.0000 mg | INTRAVENOUS | Status: AC
  Administered 2020-01-10: 80 mg

## 2020-01-10 MED ORDER — ALBUTEROL SULFATE (2.5 MG/3ML) 0.083% IN NEBU: 2.5000 mg | INHALATION_SOLUTION | Freq: Four times a day (QID) | RESPIRATORY_TRACT | Status: DC | PRN

## 2020-01-10 MED ORDER — HEPARIN (PORCINE) IN NACL 1000-0.9 UT/500ML-% IV SOLN
INTRAVENOUS | Status: DC | PRN
  Administered 2020-01-10: 500 mL

## 2020-01-10 SURGICAL SUPPLY — 7 items
CABLE SURGICAL S-101-97-12 (CABLE) ×2 IMPLANT
LEAD TENDRIL MRI 52CM LPA1200M (Lead) ×1 IMPLANT
LEAD TENDRIL MRI 58CM LPA1200M (Lead) ×1 IMPLANT
PACEMAKER ASSURITY DR-RF (Pacemaker) ×1 IMPLANT
PAD PRO RADIOLUCENT 2001M-C (PAD) ×2 IMPLANT
SHEATH 8FR PRELUDE SNAP 13 (SHEATH) ×2 IMPLANT
TRAY PACEMAKER INSERTION (PACKS) ×2 IMPLANT

## 2020-01-10 NOTE — Progress Notes (Signed)
Rapid response called due sudden onset of High Heart rate,  Dr   Billie Ruddy contacted and came to room.

## 2020-01-10 NOTE — H&P (Addendum)
History & Physical    Patient ID: Kerri Mills MRN: [id number], DOB/AGE: 1930-11-27   Admit date: 01/10/2020    Primary Physician: Einar Pheasant, MD Primary Cardiologist: Ida Rogue, MD  Patient Profile    86 year old female with a history of persistent atrial fibrillation, atypical chest pain, hypertension, hyperlipidemia, demand ischemia, intermittent left bundle branch block, remote DVT (1992), obstructive sleep apnea, breast cancer status post lumpectomy and radiation therapy, chronic venous insufficiency with lower extremity edema, thyroid cancer, melanoma, depression, gastric ulcer, GERD, diverticulitis, who is being transferred to [facility] secondary to symptomatic bradycardia.  Past Medical History    Past Medical History:  Diagnosis Date  . Allergy   . Anxiety   . Arthritis   . Atypical chest pain    a. 10/2018 MV: small, fixed apical defect possibly 2/2 attenuation artifact. No ischemia.  EF 68%.  . Clotting disorder ([place])   . Colitis   . Depression   . Diverticulitis 2013  . Gastric ulcer   . GERD (gastroesophageal reflux disease)   . History of echocardiogram    a. 09/2019 Echo: EF 55-60%, mod LVH. Mildly dil LA. Triv MR/TR.  Marland Kitchen Hypercholesterolemia   . Hypertension   . Infiltrating lobular carcinoma of left breast 2011   T2,N0, ER: 90%; PR 0%; Her 2 neu not amplified. [facility]).  . Melanoma ([place]) 1997  . Melanoma in situ of upper extremity ([place]) 03/19/2011  . Persistent atrial fibrillation ([place])    a. CHADS2VASc => 4 (HTN, age x 2, female)  . Personal history of radiation therapy 2011   BREAST CA  . Seroma    HISTORY OF LFT BREAST  . Sleep apnea   . Thyroid cancer ([place]) 1992    Past Surgical History:  Procedure Laterality Date  . ABDOMINAL HYSTERECTOMY  1973   partial  . BREAST BIOPSY Left 02-13-13   BENIGN BREAST TISSUE WITH CHANGES CONSISTENT WITH FAT NECROSIS  . BREAST BIOPSY Left 01/21/2015   bx done in [facility] office 11:00 left  6-8cmfn  . BREAST EXCISIONAL BIOPSY Left 1995   neg  . BREAST EXCISIONAL BIOPSY Left 2011   Breast cancer radiation  . BREAST LUMPECTOMY Left 2011   BREAST CA  . CARDIAC CATHETERIZATION    . CHOLECYSTECTOMY    . COLONOSCOPY  2013  . MELANOMA EXCISION     RT UPPER ARM  . PARTIAL HYSTERECTOMY     bleeding, ovaries in place.    . THYROID SURGERY  1992   FOR THYROID CANCER  . TONSILLECTOMY       Allergies  Allergies  Allergen Reactions  . Atorvastatin     Other reaction(s): Other (See Comments) STIFFNESS AND SORE STIFFNESS AND SORE  . Lipitor [Atorvastatin Calcium] Other (See Comments)    Stiffness & soreness  . Penicillins Rash    REACTION: Unknown reaction    History of Present Illness    86 year old female with the above complex past medical history including persistent atrial fibrillation, hypertension, hyperlipidemia, demand ischemia, intermittent left bundle branch block, atypical chest pain, breast cancer status post lumpectomy and radiation therapy, chronic venous insufficiency with lower extremity edema, thyroid cancer, melanoma, depression, gastric ulcer, GERD, and diverticulitis.  In January 2019, she was admitted with chest pain atrial fibrillation with rapid ventricular response.  Echo showed normal LV function with grade 1 diastolic dysfunction.  She subsequently converted to sinus rhythm and developed bradycardia on beta-blocker and diltiazem therapy resulting in discontinuation of both.  She  has been anticoagulated with Eliquis since.  In February 2020, she was seen with complaints of crushing chest pain of 2 weeks duration.  Lexiscan stress testing was undertaken and was low risk/nonischemic.  January 2021, she was admitted to [facility] with atypical, "electrical shock-like" chest pain.  Lab work was unremarkable and troponin was normal.  During admission, she was noted to have frequent PACs and PVCs and was placed back on low-dose beta-blocker (Toprol-XL 12.5  mg daily).  Echocardiogram showed normal LV function.  Given lack of objective evidence of ischemia and atypical symptoms, she was discharged without further ischemic evaluation.  She was in her usual state of health until the afternoon of April 14, when she was walking in the cafeteria at her assisted living facility and had sudden onset of shortness of breath and lightheadedness.  She was trying to get to the nearest table but suddenly lost consciousness.  This was witnessed by several staff members.  By the time she had regained consciousness, EMTs were arriving.  Initial ECG showed what appears to be slow atrial fibrillation versus junctional rhythm at a rate of 39 bpm.  Over the next 5 minutes, heart rate rose to the high 50s/low 60s.  In the emergency department, initial ECG at 1425 shows junctional bradycardia 59 bpm with a left bundle branch block while follow-up ECG 2 hours later shows what appears to be atrial fibrillation at a rate of 66 bpm.  She does have a history of very small P wave amplitude and significant baseline artifact.  Lab work on presentation was unremarkable.  High-sensitivity troponins have been normal.  CT imaging did not show any significant trauma however she has had hip and neck discomfort since admission.  Beta-blocker therapy was held with last dose occurring at approximate 10:30 AM on April 14.  She was monitored on telemetry and was maintaining sinus rhythm with frequent PACs and PVCs.  We saw her in consultation on April 15 and placed a 0 monitor in preparation for discharge this morning.  Unfortunately, on the evening of April 15 at 6:58 PM, she was trying to get out of bed and had sudden onset of dyspnea and presyncope.  This lasted about 20 seconds and resolve spontaneously.  Her daughter was contacted by Elwyn Reach, as well as our call physician at approximately 8 PM indicating that she was found to have up to 6-second pauses.  Review of telemetry this morning does not fact show  multiple pauses at 6:58 PM last night, the longest of 6 seconds.  She has had no recurrence since then and has continued to have frequent PACs, PVCs and brief runs of SVT.  In the setting of symptomatic bradycardia despite holding of beta-blocker therapy for 48 hours now (approximately 31.5 hours at the time of bradycardia last night), she Saleha Kalp be transferred to [facility] for evaluation by electrophysiology team and permanent pacemaker placement.  Home Medications    Prior to Admission medications   Medication Sig Start Date End Date Taking? Authorizing Provider  albuterol (PROAIR HFA) 108 (90 Base) MCG/ACT inhaler Inhale 2 puffs into the lungs every 6 (six) hours as needed for wheezing or shortness of breath. 08/10/18   Einar Pheasant, MD  Cholecalciferol (VITAMIN D) 1000 UNITS capsule Take 1,000 Units by mouth daily.      [provider]  DULoxetine (CYMBALTA) 60 MG capsule TAKE 1 CAPSULE (60 MG TOTAL) BY MOUTH AT BEDTIME. 09/11/19   Einar Pheasant, MD  ELIQUIS 5 MG TABS  tablet TAKE 1 TABLET BY MOUTH TWICE A DAY Patient taking differently: Take 5 mg by mouth 2 (two) times daily.  10/30/19   Minna Merritts, MD  furosemide (LASIX) 20 MG tablet TAKE 1 TABLET (20 MG TOTAL) BY MOUTH AS DIRECTED. TAKE 1 TABLET (20 MG) TWICE A WEEK WITH POTASSIUM 09/13/19 01/08/20  Minna Merritts, MD  gabapentin (NEURONTIN) 300 MG capsule TAKE 2 CAPSULES (600 MG TOTAL) BY MOUTH AT BEDTIME. Patient taking differently: Take 600 mg by mouth at bedtime.  09/17/19   Einar Pheasant, MD  levothyroxine (SYNTHROID) 88 MCG tablet Take 1 tablet (88 mcg total) by mouth daily before breakfast. TAKE ONE TABLET BY MOUTH ONCE DAILY BEFORE BREAKFAST Patient taking differently: Take 88 mcg by mouth daily before breakfast.  10/21/19   Enzo Bi, MD  lisinopril (ZESTRIL) 20 MG tablet Take 0.5 tablets (10 mg total) by mouth daily. 01/09/20   Enzo Bi, MD  lovastatin (MEVACOR) 40 MG tablet Take 1 tablet (40 mg total) by mouth  at bedtime. 10/21/19   Enzo Bi, MD  metoprolol succinate (TOPROL-XL) 25 MG 24 hr tablet Take 12.5 mg by mouth daily.    [provider]  Multiple Vitamins-Minerals (PRESERVISION AREDS 2) CAPS Take 1 tablet by mouth 2 (two) times daily.     [provider]  omeprazole (PRILOSEC) 20 MG capsule TAKE 1 CAPSULE BY MOUTH EVERY DAY Patient taking differently: Take 20 mg by mouth daily.  12/09/19   Einar Pheasant, MD  potassium chloride (KLOR-CON) 10 MEQ tablet TAKE 2 TABLETS (20 MEQ TOTAL) BY MOUTH AS DIRECTED. TAKE 2 TABLETS (20 MEQ) TWICE A WEEK WITH LASIX 12/09/19   Minna Merritts, MD    Family History    Family History  Problem Relation Age of Onset  . Heart disease Mother   . Cancer Brother        lung   . Cancer Sister        breast  . Breast cancer Neg Hx    She indicated that her mother is deceased. She indicated that her father is deceased. She indicated that her sister is alive. She indicated that the status of her brother is unknown. She indicated that her daughter is alive. She indicated that the status of her neg hx is unknown.   Social History    Social History   Socioeconomic History  . Marital status: Widowed    Spouse name: Not on file  . Number of children: Not on file  . Years of education: Not on file  . Highest education level: Not on file  Occupational History  . Not on file  Tobacco Use  . Smoking status: Never Smoker  . Smokeless tobacco: Never Used  Substance and Sexual Activity  . Alcohol use: Yes    Alcohol/week: 0.0 standard drinks    Comment: social drinking. average times a week  . Drug use: No  . Sexual activity: Never  Other Topics Concern  . Not on file  Social History Narrative   Independent and baseline. Lives by herself   Social Determinants of Health   Financial Resource Strain:   . Difficulty of Paying Living Expenses:   Food Insecurity:   . Worried About Charity fundraiser in the Last Year:   . Arboriculturist  in the Last Year:   Transportation Needs:   . Film/video editor (Medical):   Marland Kitchen Lack of Transportation (Non-Medical):   Physical Activity:   . Days  of Exercise per Week:   . Minutes of Exercise per Session:   Stress:   . Feeling of Stress :   Social Connections:   . Frequency of Communication with Friends and Family:   . Frequency of Social Gatherings with Friends and Family:   . Attends Religious Services:   . Active Member of Clubs or Organizations:   . Attends Archivist Meetings:   Marland Kitchen Marital Status:   Intimate Partner Violence:   . Fear of Current or Ex-Partner:   . Emotionally Abused:   Marland Kitchen Physically Abused:   . Sexually Abused:      Review of Systems    General:  No chills, fever, night sweats or weight changes.  Cardiovascular:  +++ dyspnea w/ presyncope and syncope x 1 on 4/14.  No chest pain, edema, orthopnea, palpitations, paroxysmal nocturnal dyspnea. Dermatological: No rash, lesions/masses Respiratory: No cough, +++ dyspnea in setting of presyncope prior to admission and on evening of 4/15. Urologic: No hematuria, dysuria Abdominal:   No nausea, vomiting, diarrhea, bright red blood per rectum, melena, or hematemesis Neurologic:  No visual changes, wkns, changes in mental status. All other systems reviewed and are otherwise negative except as noted above.  Physical Exam    T 98, HR 77, R 16, BP  152/86, 93% on RA. General: Pleasant, NAD Psych: Normal affect. Neuro: Alert and oriented X 3. Moves all extremities spontaneously. HEENT: Normal  Neck: Supple without bruits or JVD. Lungs:  Resp regular and unlabored, CTA. Heart: RRR no s3, s4, or murmurs. Abdomen: Soft, non-tender, non-distended, BS + x 4.  Extremities: No clubbing, cyanosis or edema. DP/PT/Radials 2+ and equal bilaterally.  Labs    Cardiac Enzymes Recent Labs  Lab 01/08/20 1430 01/08/20 1644  TROPONINIHS 7 10      Lab Results  Component Value Date   WBC 6.7 01/10/2020    HGB 13.0 01/10/2020   HCT 40.6 01/10/2020   MCV 95.3 01/10/2020   PLT 210 01/10/2020    Recent Labs  Lab 01/08/20 1430 01/09/20 0358 01/10/20 0404  NA 140   < > 141  K 4.2   < > 4.0  CL 105   < > 104  CO2 26   < > 26  BUN 16   < > 13  CREATININE 0.97   < > 0.74  CALCIUM 9.7   < > 9.7  PROT 6.6  --   --   BILITOT 0.9  --   --   ALKPHOS 111  --   --   ALT 15  --   --   AST 20  --   --   GLUCOSE 160*   < > 119*   < > = values in this interval not displayed.   Lab Results  Component Value Date   CHOL 175 10/21/2019   HDL 33 (L) 10/21/2019   LDLCALC 103 (H) 10/21/2019   TRIG 195 (H) 10/21/2019    Radiology Studies    DG Tibia/Fibula Left  Result Date: 01/08/2020 CLINICAL DATA:  [place]. Injured left leg. EXAM: LEFT TIBIA AND FIBULA - 2 VIEW COMPARISON:  None. FINDINGS: The knee and ankle joints are maintained. Moderate degenerative changes are noted at the knee joint. The tibia and fibula are intact. No acute fracture is identified. IMPRESSION: No acute fracture. Electronically Signed   By: Marijo Sanes M.D.   On: 01/08/2020 15:58   CT Head Wo Contrast  Result Date: 01/08/2020 CLINICAL DATA:  Fall, bruising  beneath the left eye, neck pain with movement EXAM: CT HEAD WITHOUT CONTRAST CT MAXILLOFACIAL WITHOUT CONTRAST CT CERVICAL SPINE WITHOUT CONTRAST TECHNIQUE: Multidetector CT imaging of the head, cervical spine, and maxillofacial structures were performed using the standard protocol without intravenous contrast. Multiplanar CT image reconstructions of the cervical spine and maxillofacial structures were also generated. COMPARISON:  CT head 11/21/2018 FINDINGS: CT HEAD FINDINGS Brain: No evidence of acute infarction, hemorrhage, hydrocephalus, extra-axial collection or mass lesion/mass effect. Symmetric prominence of the ventricles, cisterns and sulci compatible with parenchymal volume loss. Patchy areas of white matter hypoattenuation are most compatible with chronic microvascular  angiopathy. Vascular: Atherosclerotic calcification of the carotid siphons. No hyperdense vessel. Skull: No calvarial fracture or suspicious osseous lesion. No scalp swelling or hematoma. Other: None CT MAXILLOFACIAL FINDINGS Osseous: No fracture of the bony orbits. Nasal bones are intact. No mid face fractures are seen. The pterygoid plates are intact. The mandible is intact. Temporomandibular joints are normally aligned. No temporal bone fractures are identified. No fractured or avulsed teeth. Few carious lesions within the maxillary dentition. Orbits: Some mild left infraorbital soft tissue swelling. No retro septal gas, stranding or hemorrhage. Prior bilateral lens extractions. The globes appear normal and symmetric. Symmetric appearance of the extraocular musculature and optic nerve sheath complexes. Normal caliber of the superior ophthalmic veins. Sinuses: Minimal mural thickening in the ethmoid sinuses. Remaining paranasal sinuses are predominantly clear. Mastoid air cells are well aerated. Middle ear cavities are clear. Soft tissues: Minimal soft tissue swelling inferior to the left orbit, correlate with site of reported injury. No other significant soft tissue swelling. No soft tissue gas or foreign body. CT CERVICAL SPINE FINDINGS Alignment: Stabilization collar is not visualized at the time of exam. There is straightening and focal reversal of the normal lordosis centered at C3. Mild anterolisthesis C2 on C3 is likely on a degenerative basis with notable fusion of the left C2-3 facets. No other significant spondylolisthesis. No traumatic listhesis is evident. No abnormally widened, perched or jumped facets. Skull base and vertebrae: No acute fracture. No primary bone lesion or focal pathologic process. Soft tissues and spinal canal: No pre or paravertebral fluid or swelling. No visible canal hematoma. Disc levels: Severe facet degenerative changes noted at the C1-2 lateral masses. Additional  mild-to-moderate multilevel cervical spondylitic changes are present most pronounced C5-C7 at most mild canal stenosis at the C5-6 level. Multilevel uncinate spurring and facet hypertrophic changes result in mild-to-moderate multilevel neural foraminal narrowing without severe foraminal stenosis. Upper chest: No acute abnormality in the upper chest or imaged lung apices. Other: Dense cervical carotid atherosclerosis. Thyroid tissue appears diminutive, correlate for prior ablation or surgery. IMPRESSION: 1. No acute intracranial findings. 2. Mild parenchymal volume loss and chronic white matter angiopathy. 3. Minimal soft tissue swelling inferior to the left orbit, correlate with site of reported injury. No facial bone fracture. 4. Few carious lesions within the maxillary dentition. Correlate with dental exam. 5. No acute fracture or traumatic listhesis. 6. Cervical spondylitic and facet degenerative changes, as detailed above. 7. Dense cervical carotid atherosclerosis. 8. Diminutive appearance of the thyroid gland, correlate for prior surgery and or ablation. Electronically Signed   By: Lovena Le M.D.   On: 01/08/2020 15:44   CT CERVICAL SPINE WO CONTRAST  Result Date: 01/08/2020 CLINICAL DATA:  Fall, bruising beneath the left eye, neck pain with movement EXAM: CT HEAD WITHOUT CONTRAST CT MAXILLOFACIAL WITHOUT CONTRAST CT CERVICAL SPINE WITHOUT CONTRAST TECHNIQUE: Multidetector CT imaging of the head, cervical spine,  and maxillofacial structures were performed using the standard protocol without intravenous contrast. Multiplanar CT image reconstructions of the cervical spine and maxillofacial structures were also generated. COMPARISON:  CT head 11/21/2018 FINDINGS: CT HEAD FINDINGS Brain: No evidence of acute infarction, hemorrhage, hydrocephalus, extra-axial collection or mass lesion/mass effect. Symmetric prominence of the ventricles, cisterns and sulci compatible with parenchymal volume loss. Patchy areas  of white matter hypoattenuation are most compatible with chronic microvascular angiopathy. Vascular: Atherosclerotic calcification of the carotid siphons. No hyperdense vessel. Skull: No calvarial fracture or suspicious osseous lesion. No scalp swelling or hematoma. Other: None CT MAXILLOFACIAL FINDINGS Osseous: No fracture of the bony orbits. Nasal bones are intact. No mid face fractures are seen. The pterygoid plates are intact. The mandible is intact. Temporomandibular joints are normally aligned. No temporal bone fractures are identified. No fractured or avulsed teeth. Few carious lesions within the maxillary dentition. Orbits: Some mild left infraorbital soft tissue swelling. No retro septal gas, stranding or hemorrhage. Prior bilateral lens extractions. The globes appear normal and symmetric. Symmetric appearance of the extraocular musculature and optic nerve sheath complexes. Normal caliber of the superior ophthalmic veins. Sinuses: Minimal mural thickening in the ethmoid sinuses. Remaining paranasal sinuses are predominantly clear. Mastoid air cells are well aerated. Middle ear cavities are clear. Soft tissues: Minimal soft tissue swelling inferior to the left orbit, correlate with site of reported injury. No other significant soft tissue swelling. No soft tissue gas or foreign body. CT CERVICAL SPINE FINDINGS Alignment: Stabilization collar is not visualized at the time of exam. There is straightening and focal reversal of the normal lordosis centered at C3. Mild anterolisthesis C2 on C3 is likely on a degenerative basis with notable fusion of the left C2-3 facets. No other significant spondylolisthesis. No traumatic listhesis is evident. No abnormally widened, perched or jumped facets. Skull base and vertebrae: No acute fracture. No primary bone lesion or focal pathologic process. Soft tissues and spinal canal: No pre or paravertebral fluid or swelling. No visible canal hematoma. Disc levels: Severe facet  degenerative changes noted at the C1-2 lateral masses. Additional mild-to-moderate multilevel cervical spondylitic changes are present most pronounced C5-C7 at most mild canal stenosis at the C5-6 level. Multilevel uncinate spurring and facet hypertrophic changes result in mild-to-moderate multilevel neural foraminal narrowing without severe foraminal stenosis. Upper chest: No acute abnormality in the upper chest or imaged lung apices. Other: Dense cervical carotid atherosclerosis. Thyroid tissue appears diminutive, correlate for prior ablation or surgery. IMPRESSION: 1. No acute intracranial findings. 2. Mild parenchymal volume loss and chronic white matter angiopathy. 3. Minimal soft tissue swelling inferior to the left orbit, correlate with site of reported injury. No facial bone fracture. 4. Few carious lesions within the maxillary dentition. Correlate with dental exam. 5. No acute fracture or traumatic listhesis. 6. Cervical spondylitic and facet degenerative changes, as detailed above. 7. Dense cervical carotid atherosclerosis. 8. Diminutive appearance of the thyroid gland, correlate for prior surgery and or ablation. Electronically Signed   By: Lovena Le M.D.   On: 01/08/2020 15:44   DG Chest Port 1 View  Result Date: 01/08/2020 CLINICAL DATA:  Syncope shortness of breath EXAM: PORTABLE CHEST 1 VIEW COMPARISON:  October 20, 2019 FINDINGS: There is mild cardiomegaly. Aortic knob calcifications. The lungs are clear. No focal airspace consolidation or pleural effusion. No acute osseous abnormality. IMPRESSION: No active disease. Electronically Signed   By: Prudencio Pair M.D.   On: 01/08/2020 16:01   MM 3D SCREEN BREAST BILATERAL  Result  Date: 12/20/2019 CLINICAL DATA:  Screening. EXAM: DIGITAL SCREENING BILATERAL MAMMOGRAM WITH TOMO AND CAD COMPARISON:  Previous exam(s). ACR Breast Density Category b: There are scattered areas of fibroglandular density. FINDINGS: There are no findings suspicious for  malignancy. Post lumpectomy changes are noted on the left including notable fat necrosis calcifications. Images were processed with CAD. IMPRESSION: No mammographic evidence of malignancy. A result letter of this screening mammogram [place] be mailed directly to the patient. RECOMMENDATION: Screening mammogram in one year. (Code:SM-B-01Y) BI-RADS CATEGORY  2: Benign. Electronically Signed   By: Lajean Manes M.D.   On: 12/20/2019 07:59   CT MAXILLOFACIAL WO CONTRAST  Result Date: 01/08/2020 CLINICAL DATA:  Fall, bruising beneath the left eye, neck pain with movement EXAM: CT HEAD WITHOUT CONTRAST CT MAXILLOFACIAL WITHOUT CONTRAST CT CERVICAL SPINE WITHOUT CONTRAST TECHNIQUE: Multidetector CT imaging of the head, cervical spine, and maxillofacial structures were performed using the standard protocol without intravenous contrast. Multiplanar CT image reconstructions of the cervical spine and maxillofacial structures were also generated. COMPARISON:  CT head 11/21/2018 FINDINGS: CT HEAD FINDINGS Brain: No evidence of acute infarction, hemorrhage, hydrocephalus, extra-axial collection or mass lesion/mass effect. Symmetric prominence of the ventricles, cisterns and sulci compatible with parenchymal volume loss. Patchy areas of white matter hypoattenuation are most compatible with chronic microvascular angiopathy. Vascular: Atherosclerotic calcification of the carotid siphons. No hyperdense vessel. Skull: No calvarial fracture or suspicious osseous lesion. No scalp swelling or hematoma. Other: None CT MAXILLOFACIAL FINDINGS Osseous: No fracture of the bony orbits. Nasal bones are intact. No mid face fractures are seen. The pterygoid plates are intact. The mandible is intact. Temporomandibular joints are normally aligned. No temporal bone fractures are identified. No fractured or avulsed teeth. Few carious lesions within the maxillary dentition. Orbits: Some mild left infraorbital soft tissue swelling. No retro septal  gas, stranding or hemorrhage. Prior bilateral lens extractions. The globes appear normal and symmetric. Symmetric appearance of the extraocular musculature and optic nerve sheath complexes. Normal caliber of the superior ophthalmic veins. Sinuses: Minimal mural thickening in the ethmoid sinuses. Remaining paranasal sinuses are predominantly clear. Mastoid air cells are well aerated. Middle ear cavities are clear. Soft tissues: Minimal soft tissue swelling inferior to the left orbit, correlate with site of reported injury. No other significant soft tissue swelling. No soft tissue gas or foreign body. CT CERVICAL SPINE FINDINGS Alignment: Stabilization collar is not visualized at the time of exam. There is straightening and focal reversal of the normal lordosis centered at C3. Mild anterolisthesis C2 on C3 is likely on a degenerative basis with notable fusion of the left C2-3 facets. No other significant spondylolisthesis. No traumatic listhesis is evident. No abnormally widened, perched or jumped facets. Skull base and vertebrae: No acute fracture. No primary bone lesion or focal pathologic process. Soft tissues and spinal canal: No pre or paravertebral fluid or swelling. No visible canal hematoma. Disc levels: Severe facet degenerative changes noted at the C1-2 lateral masses. Additional mild-to-moderate multilevel cervical spondylitic changes are present most pronounced C5-C7 at most mild canal stenosis at the C5-6 level. Multilevel uncinate spurring and facet hypertrophic changes result in mild-to-moderate multilevel neural foraminal narrowing without severe foraminal stenosis. Upper chest: No acute abnormality in the upper chest or imaged lung apices. Other: Dense cervical carotid atherosclerosis. Thyroid tissue appears diminutive, correlate for prior ablation or surgery. IMPRESSION: 1. No acute intracranial findings. 2. Mild parenchymal volume loss and chronic white matter angiopathy. 3. Minimal soft tissue  swelling inferior to the left orbit,  correlate with site of reported injury. No facial bone fracture. 4. Few carious lesions within the maxillary dentition. Correlate with dental exam. 5. No acute fracture or traumatic listhesis. 6. Cervical spondylitic and facet degenerative changes, as detailed above. 7. Dense cervical carotid atherosclerosis. 8. Diminutive appearance of the thyroid gland, correlate for prior surgery and or ablation. Electronically Signed   By: Lovena Le M.D.   On: 01/08/2020 15:44    ECG & Cardiac Imaging    EMS ECG appears to show slow A. fib at a rate of 39 bpm with follow-up ECG approximately 5 minutes later showing a more regular rhythm, likely junctional bradycardia versus sinus rhythm (poor baseline). Initial ED ECG at 14: 25-junctional bradycardia, 59, LBBB - personally reviewed. Follow-up ED ECG at 1634 - ? A. fib, 66, left bundle branch block- personally reviewed.  Assessment & Plan    1.  Syncope/symptomatic bradycardia: Patient has a history of bradycardia on beta-blocker therapy in the past but was recently placed on low-dose-Toprol-XL 12.5 mg daily in January 2020 in the setting of frequent atrial and ventricular ectopy noted on monitoring during brief hospital stay for atypical chest pain.  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  She did not experience nausea, vomiting, diaphoresis, or chest pain either preceding or following syncopal event.  Last dose of Toprol-XL was April 14 at 10:30 AM.  EMS ECGs showed bradycardia at a rate of 38 with ? slow A. fib versus junctional rhythm versus AV block.  She has a long history of low P wave amplitude and on EMS ECGs, baseline artifact noted.  ER ECG is more consistent with junctional bradycardia.  On telemetry, she was maintaining sinus rhythm with frequent PACs and PVCs.  We placed a Zio monitor on the evening of April 15 in  preparation for discharge this morning and she was found to have up to 6-second pauses with ventricular escape's at 6:58 PM.  These were associated with dyspnea and presyncope, similar to what led to admission.  At the time of the symptoms, she had been off of beta-blocker therapy for approximately 31.5 hours.  Since then, she has had no recurrence of presyncope or pauses.  She has had frequent PACs, PVCs and brief runs of SVT.  I discussed her case with our electrophysiology team at [facility] and she Lua Feng be transferred there today for EP evaluation and permanent pacemaker placement.  Continue to hold beta-blocker.  I have held her morning dose of Eliquis and she is to remain n.p.o.  2.  Asymptomatic PVCs: Frequent PVCs and ventricular couplets on telemetry in the setting of holding beta-blocker therapy.  Danyel Tobey likely plan to resume post pacemaker placement.  Previous echo in January showed normal LV function.  3.  Essential hypertension: Stable on lisinopril therapy.  Beta-blocker on hold.  4.  Hyperlipidemia: Remains on statin therapy with an LDL of 103 in January 2021.  5.  Paroxysmal atrial fibrillation: Initial EMS ECG appears to show slow A. fib at a rate of 39, as did ER ECG at 4:30 PM on April 14, though rate had improved.  She does have a history of low P wave amplitude and baseline artifact which makes rhythm determination somewhat difficult though she was irregular on both of those ECGs.  She has not had any rapid atrial fibrillation since admission and is otherwise maintained sinus rhythm.  Plan to resume beta-blocker post pacemaker  placement.  Eliquis currently on hold pending pacemaker placement this afternoon.  Plan to resume once okay with electrophysiology.  6.  Left bundle branch block: She does have prior history of intermittent left bundle branch block.  Echo in January showed normal LV function.  She has not any chest pain and troponins are normal this admission.  Signed, Murray Hodgkins, NP 01/10/2020, 12:28 PM   I have seen and examined this patient with Ignacia Bayley.  Agree with above, note added to reflect my findings.  On exam, RRR, no murmurs, lungs clear.  Patient admitted to the hospital with episodes of syncope.  She was watched on telemetry and had symptomatic sinus pauses.  This was despite washout of her rate controlling medications for her atrial fibrillation.  No reversible cause found.  Due to that, we Zeven Kocak plan for pacemaker implant.  Constantinos Krempasky M. Latesha Chesney MD 01/10/2020 3:40 PM

## 2020-01-10 NOTE — H&P (Signed)
Kerri Mills has presented today for surgery, with the diagnosis of tachy/brady syndrome with complete AV block.  The various methods of treatment have been discussed with the patient and family. After consideration of risks, benefits and other options for treatment, the patient has consented to  Procedure(s): Pacemaker implant as a surgical intervention .  Risks include but not limited to bleeding, tamponade, infection, pneumothorax, among others. The patient's history has been reviewed, patient examined, no change in status, stable for surgery.  I have reviewed the patient's chart and labs.  Questions were answered to the patient's satisfaction.    Krisinda Giovanni Curt Bears, MD 01/10/2020 3:39 PM

## 2020-01-10 NOTE — Progress Notes (Signed)
Progress Note  Patient Name: [name] Date of Encounter: 01/10/2020  Primary Cardiologist: Ida Rogue, MD  Subjective   Pt w/ recurrent presyncope last night just prior to 7p.  Was trying to get out of bed and suddenly felt lightheaded and dyspneic.  This coincided w/ pauses of up to six secs on tele.  Our call staff was notified by [facility], as it was caught on external monitor.  No recurrence overnight.  Tele otw notable for brief runs of svt/atrial tach and freq pvc's/couplets.  Inpatient Medications    Scheduled Meds: . DULoxetine  60 mg Oral QHS  . gabapentin  600 mg Oral QHS  . levothyroxine  88 mcg Oral Q0600  . lisinopril  10 mg Oral Daily  . pantoprazole  40 mg Oral Daily  . pravastatin  40 mg Oral q1800   Continuous Infusions:  PRN Meds: ibuprofen   Vital Signs    Vitals:   01/09/20 0120 01/09/20 0430 01/09/20 0750 01/09/20 2337  BP: (!) 108/52 123/74 114/72 (!) 158/75  Pulse: (!) 51 60 62 78  Resp: 18 16 16 17   Temp: 98.3 F (36.8 C) 98.3 F (36.8 C)  98.1 F (36.7 C)  TempSrc: Oral Oral  Oral  SpO2: 96% 95% 99% 94%  Weight:      Height:        Intake/Output Summary (Last 24 hours) at 01/10/2020 0842 Last data filed at 01/09/2020 1845 Gross per 24 hour  Intake 720 ml  Output --  Net 720 ml   Filed Weights   01/08/20 1423  Weight: 81.6 kg    Physical Exam   GEN: Well nourished, well developed, in no acute distress.  HEENT: Grossly normal.  Neck: Supple, no JVD, carotid bruits, or masses. Cardiac: RRR, no murmurs, rubs, or gallops. No clubbing, cyanosis, edema.  Radials/DP/PT 2+ and equal bilaterally.  Respiratory:  Respirations regular and unlabored, clear to auscultation bilaterally. GI: Soft, nontender, nondistended, BS + x 4. MS: no deformity or atrophy. Skin: warm and dry, no rash. Neuro:  Strength and sensation are intact. Psych: AAOx3.  Normal affect.  Labs    Chemistry Recent Labs  Lab 01/08/20 1430 01/09/20 0358  01/10/20 0404  NA 140 142 141  K 4.2 3.9 4.0  CL 105 105 104  CO2 26 29 26   GLUCOSE 160* 115* 119*  BUN 16 17 13   CREATININE 0.97 0.97 0.74  CALCIUM 9.7 9.5 9.7  PROT 6.6  --   --   ALBUMIN 3.8  --   --   AST 20  --   --   ALT 15  --   --   ALKPHOS 111  --   --   BILITOT 0.9  --   --   GFRNONAA 53* 53* >60  GFRAA >60 >60 >60  ANIONGAP 9 8 11      Hematology Recent Labs  Lab 01/08/20 1430 01/09/20 0358 01/10/20 0404  WBC 6.0 6.6 6.7  RBC 4.22 3.99 4.26  HGB 12.7 12.2 13.0  HCT 39.8 37.7 40.6  MCV 94.3 94.5 95.3  MCH 30.1 30.6 30.5  MCHC 31.9 32.4 32.0  RDW 12.5 12.4 12.4  PLT 206 202 210    Cardiac Enzymes  Recent Labs  Lab 01/08/20 1430 01/08/20 1644  TROPONINIHS 7 10      Radiology    DG Tibia/Fibula Left  Result Date: 01/08/2020 CLINICAL DATA:  [place]. Injured left leg. EXAM: LEFT TIBIA AND FIBULA - 2 VIEW COMPARISON:  None. FINDINGS: The knee and ankle joints are maintained. Moderate degenerative changes are noted at the knee joint. The tibia and fibula are intact. No acute fracture is identified. IMPRESSION: No acute fracture. Electronically Signed   By: Marijo Sanes M.D.   On: 01/08/2020 15:58   CT Head Wo Contrast  Result Date: 01/08/2020 CLINICAL DATA:  Fall, bruising beneath the left eye, neck pain with movement EXAM: CT HEAD WITHOUT CONTRAST CT MAXILLOFACIAL WITHOUT CONTRAST CT CERVICAL SPINE WITHOUT CONTRAST TECHNIQUE: Multidetector CT imaging of the head, cervical spine, and maxillofacial structures were performed using the standard protocol without intravenous contrast. Multiplanar CT image reconstructions of the cervical spine and maxillofacial structures were also generated. COMPARISON:  CT head 11/21/2018 FINDINGS: CT HEAD FINDINGS Brain: No evidence of acute infarction, hemorrhage, hydrocephalus, extra-axial collection or mass lesion/mass effect. Symmetric prominence of the ventricles, cisterns and sulci compatible with parenchymal volume loss.  Patchy areas of white matter hypoattenuation are most compatible with chronic microvascular angiopathy. Vascular: Atherosclerotic calcification of the carotid siphons. No hyperdense vessel. Skull: No calvarial fracture or suspicious osseous lesion. No scalp swelling or hematoma. Other: None CT MAXILLOFACIAL FINDINGS Osseous: No fracture of the bony orbits. Nasal bones are intact. No mid face fractures are seen. The pterygoid plates are intact. The mandible is intact. Temporomandibular joints are normally aligned. No temporal bone fractures are identified. No fractured or avulsed teeth. Few carious lesions within the maxillary dentition. Orbits: Some mild left infraorbital soft tissue swelling. No retro septal gas, stranding or hemorrhage. Prior bilateral lens extractions. The globes appear normal and symmetric. Symmetric appearance of the extraocular musculature and optic nerve sheath complexes. Normal caliber of the superior ophthalmic veins. Sinuses: Minimal mural thickening in the ethmoid sinuses. Remaining paranasal sinuses are predominantly clear. Mastoid air cells are well aerated. Middle ear cavities are clear. Soft tissues: Minimal soft tissue swelling inferior to the left orbit, correlate with site of reported injury. No other significant soft tissue swelling. No soft tissue gas or foreign body. CT CERVICAL SPINE FINDINGS Alignment: Stabilization collar is not visualized at the time of exam. There is straightening and focal reversal of the normal lordosis centered at C3. Mild anterolisthesis C2 on C3 is likely on a degenerative basis with notable fusion of the left C2-3 facets. No other significant spondylolisthesis. No traumatic listhesis is evident. No abnormally widened, perched or jumped facets. Skull base and vertebrae: No acute fracture. No primary bone lesion or focal pathologic process. Soft tissues and spinal canal: No pre or paravertebral fluid or swelling. No visible canal hematoma. Disc levels:  Severe facet degenerative changes noted at the C1-2 lateral masses. Additional mild-to-moderate multilevel cervical spondylitic changes are present most pronounced C5-C7 at most mild canal stenosis at the C5-6 level. Multilevel uncinate spurring and facet hypertrophic changes result in mild-to-moderate multilevel neural foraminal narrowing without severe foraminal stenosis. Upper chest: No acute abnormality in the upper chest or imaged lung apices. Other: Dense cervical carotid atherosclerosis. Thyroid tissue appears diminutive, correlate for prior ablation or surgery. IMPRESSION: 1. No acute intracranial findings. 2. Mild parenchymal volume loss and chronic white matter angiopathy. 3. Minimal soft tissue swelling inferior to the left orbit, correlate with site of reported injury. No facial bone fracture. 4. Few carious lesions within the maxillary dentition. Correlate with dental exam. 5. No acute fracture or traumatic listhesis. 6. Cervical spondylitic and facet degenerative changes, as detailed above. 7. Dense cervical carotid atherosclerosis. 8. Diminutive appearance of the thyroid gland, correlate for prior surgery and or  ablation. Electronically Signed   By: Lovena Le M.D.   On: 01/08/2020 15:44   CT CERVICAL SPINE WO CONTRAST  Result Date: 01/08/2020 CLINICAL DATA:  Fall, bruising beneath the left eye, neck pain with movement EXAM: CT HEAD WITHOUT CONTRAST CT MAXILLOFACIAL WITHOUT CONTRAST CT CERVICAL SPINE WITHOUT CONTRAST TECHNIQUE: Multidetector CT imaging of the head, cervical spine, and maxillofacial structures were performed using the standard protocol without intravenous contrast. Multiplanar CT image reconstructions of the cervical spine and maxillofacial structures were also generated. COMPARISON:  CT head 11/21/2018 FINDINGS: CT HEAD FINDINGS Brain: No evidence of acute infarction, hemorrhage, hydrocephalus, extra-axial collection or mass lesion/mass effect. Symmetric prominence of the  ventricles, cisterns and sulci compatible with parenchymal volume loss. Patchy areas of white matter hypoattenuation are most compatible with chronic microvascular angiopathy. Vascular: Atherosclerotic calcification of the carotid siphons. No hyperdense vessel. Skull: No calvarial fracture or suspicious osseous lesion. No scalp swelling or hematoma. Other: None CT MAXILLOFACIAL FINDINGS Osseous: No fracture of the bony orbits. Nasal bones are intact. No mid face fractures are seen. The pterygoid plates are intact. The mandible is intact. Temporomandibular joints are normally aligned. No temporal bone fractures are identified. No fractured or avulsed teeth. Few carious lesions within the maxillary dentition. Orbits: Some mild left infraorbital soft tissue swelling. No retro septal gas, stranding or hemorrhage. Prior bilateral lens extractions. The globes appear normal and symmetric. Symmetric appearance of the extraocular musculature and optic nerve sheath complexes. Normal caliber of the superior ophthalmic veins. Sinuses: Minimal mural thickening in the ethmoid sinuses. Remaining paranasal sinuses are predominantly clear. Mastoid air cells are well aerated. Middle ear cavities are clear. Soft tissues: Minimal soft tissue swelling inferior to the left orbit, correlate with site of reported injury. No other significant soft tissue swelling. No soft tissue gas or foreign body. CT CERVICAL SPINE FINDINGS Alignment: Stabilization collar is not visualized at the time of exam. There is straightening and focal reversal of the normal lordosis centered at C3. Mild anterolisthesis C2 on C3 is likely on a degenerative basis with notable fusion of the left C2-3 facets. No other significant spondylolisthesis. No traumatic listhesis is evident. No abnormally widened, perched or jumped facets. Skull base and vertebrae: No acute fracture. No primary bone lesion or focal pathologic process. Soft tissues and spinal canal: No pre or  paravertebral fluid or swelling. No visible canal hematoma. Disc levels: Severe facet degenerative changes noted at the C1-2 lateral masses. Additional mild-to-moderate multilevel cervical spondylitic changes are present most pronounced C5-C7 at most mild canal stenosis at the C5-6 level. Multilevel uncinate spurring and facet hypertrophic changes result in mild-to-moderate multilevel neural foraminal narrowing without severe foraminal stenosis. Upper chest: No acute abnormality in the upper chest or imaged lung apices. Other: Dense cervical carotid atherosclerosis. Thyroid tissue appears diminutive, correlate for prior ablation or surgery. IMPRESSION: 1. No acute intracranial findings. 2. Mild parenchymal volume loss and chronic white matter angiopathy. 3. Minimal soft tissue swelling inferior to the left orbit, correlate with site of reported injury. No facial bone fracture. 4. Few carious lesions within the maxillary dentition. Correlate with dental exam. 5. No acute fracture or traumatic listhesis. 6. Cervical spondylitic and facet degenerative changes, as detailed above. 7. Dense cervical carotid atherosclerosis. 8. Diminutive appearance of the thyroid gland, correlate for prior surgery and or ablation. Electronically Signed   By: Lovena Le M.D.   On: 01/08/2020 15:44   DG Chest Port 1 View  Result Date: 01/08/2020 CLINICAL DATA:  Syncope shortness  of breath EXAM: PORTABLE CHEST 1 VIEW COMPARISON:  October 20, 2019 FINDINGS: There is mild cardiomegaly. Aortic knob calcifications. The lungs are clear. No focal airspace consolidation or pleural effusion. No acute osseous abnormality. IMPRESSION: No active disease. Electronically Signed   By: Prudencio Pair M.D.   On: 01/08/2020 16:01   CT MAXILLOFACIAL WO CONTRAST  Result Date: 01/08/2020 CLINICAL DATA:  Fall, bruising beneath the left eye, neck pain with movement EXAM: CT HEAD WITHOUT CONTRAST CT MAXILLOFACIAL WITHOUT CONTRAST CT CERVICAL SPINE WITHOUT  CONTRAST TECHNIQUE: Multidetector CT imaging of the head, cervical spine, and maxillofacial structures were performed using the standard protocol without intravenous contrast. Multiplanar CT image reconstructions of the cervical spine and maxillofacial structures were also generated. COMPARISON:  CT head 11/21/2018 FINDINGS: CT HEAD FINDINGS Brain: No evidence of acute infarction, hemorrhage, hydrocephalus, extra-axial collection or mass lesion/mass effect. Symmetric prominence of the ventricles, cisterns and sulci compatible with parenchymal volume loss. Patchy areas of white matter hypoattenuation are most compatible with chronic microvascular angiopathy. Vascular: Atherosclerotic calcification of the carotid siphons. No hyperdense vessel. Skull: No calvarial fracture or suspicious osseous lesion. No scalp swelling or hematoma. Other: None CT MAXILLOFACIAL FINDINGS Osseous: No fracture of the bony orbits. Nasal bones are intact. No mid face fractures are seen. The pterygoid plates are intact. The mandible is intact. Temporomandibular joints are normally aligned. No temporal bone fractures are identified. No fractured or avulsed teeth. Few carious lesions within the maxillary dentition. Orbits: Some mild left infraorbital soft tissue swelling. No retro septal gas, stranding or hemorrhage. Prior bilateral lens extractions. The globes appear normal and symmetric. Symmetric appearance of the extraocular musculature and optic nerve sheath complexes. Normal caliber of the superior ophthalmic veins. Sinuses: Minimal mural thickening in the ethmoid sinuses. Remaining paranasal sinuses are predominantly clear. Mastoid air cells are well aerated. Middle ear cavities are clear. Soft tissues: Minimal soft tissue swelling inferior to the left orbit, correlate with site of reported injury. No other significant soft tissue swelling. No soft tissue gas or foreign body. CT CERVICAL SPINE FINDINGS Alignment: Stabilization collar is  not visualized at the time of exam. There is straightening and focal reversal of the normal lordosis centered at C3. Mild anterolisthesis C2 on C3 is likely on a degenerative basis with notable fusion of the left C2-3 facets. No other significant spondylolisthesis. No traumatic listhesis is evident. No abnormally widened, perched or jumped facets. Skull base and vertebrae: No acute fracture. No primary bone lesion or focal pathologic process. Soft tissues and spinal canal: No pre or paravertebral fluid or swelling. No visible canal hematoma. Disc levels: Severe facet degenerative changes noted at the C1-2 lateral masses. Additional mild-to-moderate multilevel cervical spondylitic changes are present most pronounced C5-C7 at most mild canal stenosis at the C5-6 level. Multilevel uncinate spurring and facet hypertrophic changes result in mild-to-moderate multilevel neural foraminal narrowing without severe foraminal stenosis. Upper chest: No acute abnormality in the upper chest or imaged lung apices. Other: Dense cervical carotid atherosclerosis. Thyroid tissue appears diminutive, correlate for prior ablation or surgery. IMPRESSION: 1. No acute intracranial findings. 2. Mild parenchymal volume loss and chronic white matter angiopathy. 3. Minimal soft tissue swelling inferior to the left orbit, correlate with site of reported injury. No facial bone fracture. 4. Few carious lesions within the maxillary dentition. Correlate with dental exam. 5. No acute fracture or traumatic listhesis. 6. Cervical spondylitic and facet degenerative changes, as detailed above. 7. Dense cervical carotid atherosclerosis. 8. Diminutive appearance of the thyroid gland,  correlate for prior surgery and or ablation. Electronically Signed   By: Lovena Le M.D.   On: 01/08/2020 15:44    Telemetry    Sinus rhythm, freq pac's, brief runs of svt/atrial tach, and freq pvc's and couplets - Personally Reviewed  Cardiac Studies   2D  Echocardiogram 1.20.2021  IMPRESSIONS     1. Left ventricular ejection fraction, by visual estimation, is 55 to  60%. The left ventricle has normal function. There is moderately increased  left ventricular hypertrophy.   2. Left ventricular diastolic parameters are indeterminate.   3. The left ventricle has no regional wall motion abnormalities.   4. Global right ventricle has normal systolic function.The right  ventricular size is normal. No increase in right ventricular wall  thickness.   5. Left atrial size was mildly dilated.   6. Right atrial size was normal.   7. The mitral valve is normal in structure. Trivial mitral valve  regurgitation. No evidence of mitral stenosis.   8. The tricuspid valve is normal in structure. Tricuspid valve  regurgitation is trivial.   9. The aortic valve is normal in structure. Aortic valve regurgitation is  not visualized. Mild to moderate aortic valve sclerosis/calcification  without any evidence of aortic stenosis.  10. The pulmonic valve was normal in structure. Pulmonic valve  regurgitation is not visualized.  11. TR signal is inadequate for assessing pulmonary artery systolic  pressure.  12. The inferior vena cava is normal in size with greater than 50%  respiratory variability, suggesting right atrial pressure of 3 mmHg.   Patient Profile   86 y.o. female with a history of persistent atrial fibrillation, atypical chest pain, hypertension, hyperlipidemia, demand ischemia, intermittent left bundle branch block, remote DVT (1992), obstructive sleep apnea, breast cancer status post lumpectomy and radiation therapy, chronic venous insufficiency with lower extremity edema, thyroid cancer, melanoma, depression, gastric ulcer, GERD, and diverticulitis, who was admitted following syncope and has been found to have up to six sec pauses (~ 32 hrs after last  blocker dose) assoc w/ presyncope.  Assessment & Plan    1.  Syncope/SSS:  Pt admitted  following syncopal spell.  Bradycardia - ? Slow afib vs junctional vs high grade heart block noted on EMS ecg w/ rates in 30's initially.  She has very small P amplitude historically and baseline artifact on EMS ecgs obscures a clear diagnosis, other than bradycardia, which resolved after about 5 mins.  Last evening, she had recurrent presyncope while trying to get up out of bed to use the bathroom.  Noted to have up to 6 sec pauses on tele at that time.  No recurrence overnight, though she does have freq pac's, pvc's, couplets, and brief runs of atrial tachycardia.  Last nights episode occurred ~ 31.5 hrs after last dose of toprol xl 12.5 mg (10:30 am on 4/14). I've talked w/ EP @ [facility] today and we are in the process of arranging for Tx for PPM placement this afternoon.  Keep NPO.  Hold eliquis this AM.  Cont to avoid AVN blocking agents.  Signed, Murray Hodgkins, NP  01/10/2020, 8:42 AM    For questions or updates, please contact   Please consult [email or web address] for contact info under Cardiology/STEMI.

## 2020-01-10 NOTE — TOC Progression Note (Signed)
Transition of Care [facility]) - Progression Note    Patient Details  Name: DISTINY ZEGARELLI MRN: [id number] Date of Birth: 10-03-1930  [facility] of [facility]) CM/SW Contact  Larcenia Holaday, Gardiner Rhyme, LCSW Phone Number: 01/10/2020, 11:31 AM  Clinical Narrative:   Pt to transfer to [facility] Cardiac cath lab. Paperwork placed in chart for [facility]. Bedside RN aware of this and will call [facility]. No further follow         Expected Discharge Plan and Services           Expected Discharge Date: 01/10/20                                     Social Determinants of Health (SDOH) Interventions    Readmission Risk Interventions No flowsheet data found.

## 2020-01-10 NOTE — Progress Notes (Signed)
PT Cancellation Note  Patient Details Name: ARDEAN POLHAMUS MRN: [id number] DOB: 11-01-1930   Cancelled Treatment:    Reason Eval/Treat Not Completed: Medical issues which prohibited therapy;Other (comment)(Patient consult received and reviewed. Per nursing and documentation patient to receive PPM placement this afternoon, is currently having multiple PVCs. Will attempt again at later time/date as appropriate.)  Janna Arch, PT, DPT   01/10/2020, 9:42 AM

## 2020-01-11 ENCOUNTER — Observation Stay (HOSPITAL_COMMUNITY): Payer: Medicare Other

## 2020-01-11 ENCOUNTER — Encounter (HOSPITAL_COMMUNITY): Payer: Self-pay | Admitting: Cardiology

## 2020-01-11 DIAGNOSIS — G4733 Obstructive sleep apnea (adult) (pediatric): Secondary | ICD-10-CM | POA: Diagnosis not present

## 2020-01-11 DIAGNOSIS — I447 Left bundle-branch block, unspecified: Secondary | ICD-10-CM | POA: Diagnosis not present

## 2020-01-11 DIAGNOSIS — R001 Bradycardia, unspecified: Secondary | ICD-10-CM

## 2020-01-11 DIAGNOSIS — E785 Hyperlipidemia, unspecified: Secondary | ICD-10-CM | POA: Diagnosis not present

## 2020-01-11 DIAGNOSIS — I495 Sick sinus syndrome: Secondary | ICD-10-CM

## 2020-01-11 DIAGNOSIS — E7800 Pure hypercholesterolemia, unspecified: Secondary | ICD-10-CM | POA: Diagnosis not present

## 2020-01-11 DIAGNOSIS — Z85820 Personal history of malignant melanoma of skin: Secondary | ICD-10-CM | POA: Diagnosis not present

## 2020-01-11 DIAGNOSIS — Z923 Personal history of irradiation: Secondary | ICD-10-CM | POA: Diagnosis not present

## 2020-01-11 DIAGNOSIS — Z853 Personal history of malignant neoplasm of breast: Secondary | ICD-10-CM | POA: Diagnosis not present

## 2020-01-11 DIAGNOSIS — Z85850 Personal history of malignant neoplasm of thyroid: Secondary | ICD-10-CM | POA: Diagnosis not present

## 2020-01-11 DIAGNOSIS — Z86718 Personal history of other venous thrombosis and embolism: Secondary | ICD-10-CM | POA: Diagnosis not present

## 2020-01-11 DIAGNOSIS — K219 Gastro-esophageal reflux disease without esophagitis: Secondary | ICD-10-CM | POA: Diagnosis not present

## 2020-01-11 DIAGNOSIS — Z79899 Other long term (current) drug therapy: Secondary | ICD-10-CM | POA: Diagnosis not present

## 2020-01-11 DIAGNOSIS — I480 Paroxysmal atrial fibrillation: Secondary | ICD-10-CM | POA: Diagnosis not present

## 2020-01-11 DIAGNOSIS — Z7901 Long term (current) use of anticoagulants: Secondary | ICD-10-CM | POA: Diagnosis not present

## 2020-01-11 DIAGNOSIS — Z880 Allergy status to penicillin: Secondary | ICD-10-CM | POA: Diagnosis not present

## 2020-01-11 DIAGNOSIS — Z45018 Encounter for adjustment and management of other part of cardiac pacemaker: Secondary | ICD-10-CM | POA: Diagnosis not present

## 2020-01-11 DIAGNOSIS — I4819 Other persistent atrial fibrillation: Secondary | ICD-10-CM | POA: Diagnosis not present

## 2020-01-11 DIAGNOSIS — I872 Venous insufficiency (chronic) (peripheral): Secondary | ICD-10-CM | POA: Diagnosis not present

## 2020-01-11 DIAGNOSIS — I10 Essential (primary) hypertension: Secondary | ICD-10-CM | POA: Diagnosis not present

## 2020-01-11 DIAGNOSIS — Z950 Presence of cardiac pacemaker: Secondary | ICD-10-CM | POA: Diagnosis not present

## 2020-01-11 MED ORDER — METOPROLOL TARTRATE 25 MG PO TABS: 25.0000 mg | ORAL_TABLET | Freq: Two times a day (BID) | ORAL | 3 refills | Status: DC

## 2020-01-11 MED ORDER — METOPROLOL TARTRATE 25 MG PO TABS: 25.0000 mg | ORAL_TABLET | Freq: Two times a day (BID) | ORAL | Status: DC

## 2020-01-11 MED ORDER — SODIUM CHLORIDE 0.9 % IV SOLN
INTRAVENOUS | Status: DC | PRN
  Administered 2020-01-11: 1000 mL via INTRAVENOUS

## 2020-01-11 NOTE — Discharge Instructions (Signed)
    Supplemental Discharge Instructions for  Pacemaker/Defibrillator Patients  Activity No heavy lifting or vigorous activity with your left/right arm for 6 to 8 weeks.  Do not raise your left/right arm above your head for one week.  Gradually raise your affected arm as drawn below.             01/14/2020                01/15/2020                01/16/2020                01/17/2020 __  NO DRIVING until cleared to at your wound check visit.  WOUND CARE - Keep the wound area clean and dry.  Do not get this area wet, no showers until cleared to at your wound check visit - The tape/steri-strips on your wound will fall off; do not pull them off.  No bandage is needed on the site.  DO  NOT apply any creams, oils, or ointments to the wound area. - If you notice any drainage or discharge from the wound, any swelling or bruising at the site, or you develop a fever > 101? F after you are discharged home, call the office at once.  Special Instructions - You are still able to use cellular telephones; use the ear opposite the side where you have your pacemaker/defibrillator.  Avoid carrying your cellular phone near your device. - When traveling through airports, show security personnel your identification card to avoid being screened in the metal detectors.  Ask the security personnel to use the hand wand. - Avoid arc welding equipment, MRI testing (magnetic resonance imaging), TENS units (transcutaneous nerve stimulators).  Call the office for questions about other devices. - Avoid electrical appliances that are in poor condition or are not properly grounded. - Microwave ovens are safe to be near or to operate.

## 2020-01-11 NOTE — Progress Notes (Signed)
SATURATION QUALIFICATIONS: (This note is used to comply with regulatory documentation for home oxygen)  Patient Saturations on Room Air at Rest = 96%  Patient Saturations on Room Air while Ambulating = 98%  Patient Saturations on 0 Liters of oxygen while Ambulating = 99%  Please briefly explain why patient needs home oxygen: patient did not need oxygen while ambulating. Marcille Blanco, RN

## 2020-01-11 NOTE — Discharge Summary (Addendum)
ELECTROPHYSIOLOGY PROCEDURE DISCHARGE SUMMARY    Patient ID: SACHET TAFEL,  MRN: [id number], DOB/AGE: [birth year] 86 y.o.  Admit date: 01/10/2020 Discharge date: 01/11/2020  Primary Care Physician: Einar Pheasant, MD  Primary Cardiologist: Ida Rogue, MD  Electrophysiologist: Constance Haw, MD   Primary Discharge Diagnosis:  Symptomatic bradycardia and tachy-brady syndrome status post pacemaker implantation this admission  Secondary Discharge Diagnosis:  Principal Problem:   Syncope Active Problems:   Atrial fibrillation [facility]   Essential hypertension   Frequent PVCs   Tachy-brady syndrome (HCC)   LBBB (left bundle branch block)   Symptomatic bradycardia    Allergies  Allergen Reactions   Atorvastatin Other (See Comments)    Other reaction(s): Other (See Comments) STIFFNESS AND SORE STIFFNESS AND SORE   Lipitor [Atorvastatin Calcium] Other (See Comments)    Stiffness & soreness   Penicillins Rash    REACTION: Unknown reaction     Procedures This Admission:  1.  Implantation of a [place] MRI  model [id number] (serial number  [id number] ) pacemaker on 01/10/20 by Dr. Curt Bears.  Through the left axillary vein, a [employer] model Tendril MRI [id number] (serial number  [id number]) right atrial lead and a St Jude Medical model Tendril MRI [id number] (serial number  [id number]) right ventricular lead were advanced with fluoroscopic visualization into the right atrial appendage and right ventricular apex positions respectively. There were no immediate post procedure complications. 2.  CXR today demonstrated no pneumothorax status post device implantation.   Brief HPI: Kerri Mills is a 86 y.o. female with history of persistent atrial fibrillation, atypical chest pain, hypertension, hyperlipidemia, demand ischemia, intermittent left bundle branch block, remote DVT (1992), obstructive sleep apnea, breast cancer status post lumpectomy and radiation therapy,  chronic venous insufficiency with lower extremity edema, thyroid cancer, melanoma, depression, gastric ulcer, GERD, diverticulitis who presented to the hospital with evidence of tachy-brady syndrome.   In January 2019, she was admitted with chest pain andatrial fibrillation with rapid ventricular response.  Echo showed normal LV function with grade 1 diastolic dysfunction.  She subsequently converted to sinus rhythm and developed bradycardia on beta-blocker and diltiazem therapy resulting in discontinuation of both.  She has been anticoagulated with Eliquis since.  In February 2020, she was seen with complaints of crushing chest pain of 2 weeks duration.  Lexiscan stress testing was undertaken and was low risk/nonischemic. In January 2021, she was admitted to [facility] with atypical, "electrical shock-like" chest pain.  Lab work was unremarkable and troponin was normal.  During admission, she was noted to have frequent PACs and PVCs and was placed back on low-dose beta-blocker (Toprol-XL 12.5 mg daily).  Echocardiogram showed normal LV function.  Given lack of objective evidence of ischemia and atypical symptoms, she was discharged without further ischemic evaluation.  She was in her usual state of health until the afternoon of January 08, 2020 when she was walking in the cafeteria at her assisted living facility and had sudden onset of shortness of breath and lightheadedness.  She was trying to get to the nearest table but suddenly lost consciousness.  This was witnessed by several staff members.  By the time she had regained consciousness, EMTs were arriving.  Initial ECG showed what appears to be slow atrial fibrillation versus junctional rhythm at a rate of 39 bpm.  Over the next 5 minutes, heart rate rose to the high 50s/low 60s.  In the emergency department, initial ECG at 1425 shows junctional bradycardia  59 bpm with a left bundle branch block while follow-up ECG 2 hours later shows what appears to  be atrial fibrillation at a rate of 66 bpm.  She does have a history of very small P wave amplitude and significant baseline artifact.  Lab work on presentation was unremarkable.  High-sensitivity troponins were normal.  CT imaging did not show any significant trauma.  Beta-blocker therapy was held with last dose 10:30 AM on April 14.  She was monitored on telemetry and was maintaining sinus rhythm with frequent PACs and PVCs. Cardiology evaluated her on April 15 and placed a Zio monitor prior to discharge and she was discharged home off beta blocker therapy.  Later that evening while at home, at 6:58 PM, she was trying to get out of bed and had sudden onset of dyspnea and presyncope.  This lasted about 20 seconds and resolve spontaneously.  Her daughter was contacted by Elwyn Reach, as well as our call physician at approximately 8 PM indicating that she was found to have up to 6-second pauses. She was sent back to the ED. Upon readmission she had no recurrent pauses but continued to have NSR with frequent PACs, PVCs and brief runs of SVT. [facility] she was then transferred to [facility] for evaluation by electrophysiology team. The patient has had symptomatic bradycardia without reversible causes identified.  Risks, benefits, and alternatives to PPM implantation were reviewed with the patient who wished to proceed.   Hospital Course:  The underwent implantation of a [place] MRI dual-chamber pacemaker yesterday with details as outlined above.  She was monitored on telemetry overnight which demonstrated no acute changes.  Left chest was without hematoma or ecchymosis.  The device was interrogated and found to be functioning normally.  CXR was obtained and demonstrated no pneumothorax status post device implantation.  Wound care, arm mobility, and restrictions were reviewed with the patient by Dr. Curt Bears.  He advised she may return the [facility] monitor as they Roseline Ebarb now be able to  monitor PVCs and atrial fib via PPM.  Dr. Curt Bears recommended to send home on Lopressor 25mg  BID (previously on Toprol).  Her first blood pressure this morning was 111/82. Later this morning it was 93/65 so we manually rechecked it and got 110/64. She ambulated with the nurse and per our discussion maintained O2 sats in the 0000000 without complication. She was instructed to hold her lisinopril for now given this morning's readings and the plan to add Lopressor. We can always add back if needed so she was given instructions to follow her BP at home and call if creeping back above [id number] systolic (or if any signs/symptoms of low BP). The patient was seen and examined by Dr. Curt Bears and considered stable for discharge to home.  EP team has already arranged/outlined follow-up and instructions on AVS.  Regarding blood thinner therapy, they were instructed to resume their Eliquis this evening per Dr. Macky Lower instructions.  Physical Exam: Vitals:   01/11/20 0350 01/11/20 0835 01/11/20 1001 01/11/20 1145  BP:  111/82 93/65 110/64  Pulse:  73 62   Resp:  18 18   Temp:  (!) 97.3 F (36.3 C) 97.6 F (36.4 C)   TempSrc:  Oral Oral   SpO2:   92%   Weight: 78.2 kg     Height:       Exam per Dr. Curt Bears: GEN- The patient is well appearing, alert and oriented x 3 today.   HEENT: normocephalic, atraumatic;  sclera clear, conjunctiva pink; hearing intact; oropharynx clear; neck supple, no JVP Lymph- no cervical lymphadenopathy Lungs- Clear to ausculation bilaterally, normal work of breathing.  No wheezes, rales, rhonchi Heart- Regular rate and rhythm, no murmurs, rubs or gallops, PMI not laterally displaced GI- soft, non-tender, non-distended, bowel sounds present, no hepatosplenomegaly Extremities- no clubbing, cyanosis, or edema; DP/PT/radial pulses 2+ bilaterally MS- no significant deformity or atrophy Skin- warm and dry, no rash or lesion, left chest without hematoma/ecchymosis Psych- euthymic mood, full  affect Neuro- strength and sensation are intact   Labs:   Lab Results  Component Value Date   WBC 6.7 01/10/2020   HGB 13.0 01/10/2020   HCT 40.6 01/10/2020   MCV 95.3 01/10/2020   PLT 210 01/10/2020    Recent Labs  Lab 01/08/20 1430 01/09/20 0358 01/10/20 0404  NA 140   < > 141  K 4.2   < > 4.0  CL 105   < > 104  CO2 26   < > 26  BUN 16   < > 13  CREATININE 0.97   < > 0.74  CALCIUM 9.7   < > 9.7  PROT 6.6  --   --   BILITOT 0.9  --   --   ALKPHOS 111  --   --   ALT 15  --   --   AST 20  --   --   GLUCOSE 160*   < > 119*   < > = values in this interval not displayed.    Discharge Medications:  Allergies as of 01/11/2020       Reactions   Atorvastatin Other (See Comments)   Other reaction(s): Other (See Comments) STIFFNESS AND SORE STIFFNESS AND SORE   Lipitor [atorvastatin Calcium] Other (See Comments)   Stiffness & soreness   Penicillins Rash   REACTION: Unknown reaction        Medication List     STOP taking these medications    lisinopril 20 MG tablet Commonly known as: ZESTRIL       TAKE these medications    albuterol 108 (90 Base) MCG/ACT inhaler Commonly known as: ProAir HFA Inhale 2 puffs into the lungs every 6 (six) hours as needed for wheezing or shortness of breath.   DULoxetine 60 MG capsule Commonly known as: CYMBALTA TAKE 1 CAPSULE (60 MG TOTAL) BY MOUTH AT BEDTIME.   Eliquis 5 MG Tabs tablet Generic drug: apixaban TAKE 1 TABLET BY MOUTH TWICE A DAY What changed: how much to take   furosemide 20 MG tablet Commonly known as: LASIX TAKE 1 TABLET (20 MG TOTAL) BY MOUTH AS DIRECTED. TAKE 1 TABLET (20 MG) TWICE A WEEK WITH POTASSIUM What changed:  when to take this additional instructions   gabapentin 300 MG capsule Commonly known as: NEURONTIN TAKE 2 CAPSULES (600 MG TOTAL) BY MOUTH AT BEDTIME. What changed:  how much to take how to take this when to take this additional instructions   GENTEAL TEARS NIGHT-TIME  OP Place 1 application into both eyes at bedtime. Night time ointment 3.5g   levothyroxine 88 MCG tablet Commonly known as: SYNTHROID Take 1 tablet (88 mcg total) by mouth daily before breakfast. TAKE ONE TABLET BY MOUTH ONCE DAILY BEFORE BREAKFAST What changed: additional instructions   lovastatin 40 MG tablet Commonly known as: MEVACOR Take 1 tablet (40 mg total) by mouth at bedtime.   metoprolol tartrate 25 MG tablet Commonly known as: LOPRESSOR Take 1 tablet (25 mg total) by  mouth 2 (two) times daily.   omeprazole 20 MG capsule Commonly known as: PRILOSEC TAKE 1 CAPSULE BY MOUTH EVERY DAY What changed: how much to take   potassium chloride 10 MEQ tablet Commonly known as: KLOR-CON TAKE 2 TABLETS (20 MEQ TOTAL) BY MOUTH AS DIRECTED. TAKE 2 TABLETS (20 MEQ) TWICE A WEEK WITH LASIX What changed: additional instructions   PreserVision AREDS 2 Caps Take 1 tablet by mouth 2 (two) times daily.   SYSTANE OP Place 1 drop into both eyes daily as needed (for dry eyes).   Vitamin D 1000 units capsule Take 1,000 Units by mouth daily.        Disposition:  Discharge Instructions     Diet - low sodium heart healthy   Complete by: As directed    Discharge instructions   Complete by: As directed    You may restart your Eliquis TONIGHT.  Your metoprolol was changed to a different version, the short acting version called Lopressor (metoprolol tartrate). You Mayelin Panos take 1 tablet (25mg ) twice a day. A prescription was sent into your pharmacy.  Please stop your lisinopril for now since your blood pressure was on the low-normal side. We can always add it back if we need to, so please check your blood pressure 1-2 times per day. If you notice your top number starting to creep up above 130, please call Dr. Donivan Scull office for further instructions. If you notice any signs/symptoms of low blood pressure, you should also call him then.   Increase activity slowly   Complete by: As directed     Please see attached sheet at the end of your After-Visit Summary for instructions on wound care, activity, and bathing.      Follow-up Information     [facility] Follow up.   Specialty: Cardiology Why: 01/20/2020 @ 10:05AM, wound check visit Contact information: [address] [place]        Constance Haw, MD Follow up.   Specialty: Cardiology Why: you Tristina Sahagian be called by Dr. Macky Lower scheduler to make a 3 month follow up visit Contact information: [place] [address] [phone]            Duration of Discharge Encounter: Greater than 30 minutes including physician time.  Signed, Charlie Pitter, PA-C  01/11/2020 12:11 PM   I have seen and examined this patient with Melina Copa.  Agree with above, note added to reflect my findings.  On exam, RRR, no murmurs, lungs clear.  Patient transferred from [facility] yesterday for pacemaker implant.  She has a history of sick sinus syndrome and syncope.  She is now status post Public house manager.  Device functioning appropriately.  Discharge today with follow-up in clinic.  Lex Linhares M. Shantavia Jha MD 01/11/2020 12:34 PM

## 2020-01-13 MED FILL — Lidocaine HCl Local Inj 1%: INTRAMUSCULAR | Qty: 60 | Status: AC

## 2020-01-17 ENCOUNTER — Encounter: Payer: Medicare Other | Admitting: Podiatry

## 2020-01-20 ENCOUNTER — Other Ambulatory Visit: Payer: Self-pay

## 2020-01-20 ENCOUNTER — Ambulatory Visit (INDEPENDENT_AMBULATORY_CARE_PROVIDER_SITE_OTHER): Payer: Medicare Other | Admitting: Student

## 2020-01-20 DIAGNOSIS — I495 Sick sinus syndrome: Secondary | ICD-10-CM | POA: Diagnosis not present

## 2020-01-20 DIAGNOSIS — R55 Syncope and collapse: Secondary | ICD-10-CM

## 2020-01-20 LAB — CUP PACEART INCLINIC DEVICE CHECK
Battery Remaining Longevity: 92 mo
Battery Voltage: 3.05 V
Brady Statistic RA Percent Paced: 51 %
Brady Statistic RV Percent Paced: 3.3 %
Date Time Interrogation Session: 20210426101218
Implantable Lead Implant Date: 20210416
Implantable Lead Implant Date: 20210416
Implantable Lead Location: 753859
Implantable Lead Location: 753860
Implantable Pulse Generator Implant Date: 20210416
Lead Channel Impedance Value: 450 Ohm
Lead Channel Impedance Value: 550 Ohm
Lead Channel Pacing Threshold Amplitude: 0.5 V
Lead Channel Pacing Threshold Amplitude: 0.5 V
Lead Channel Pacing Threshold Amplitude: 0.75 V
Lead Channel Pacing Threshold Amplitude: 0.75 V
Lead Channel Pacing Threshold Pulse Width: 0.5 ms
Lead Channel Pacing Threshold Pulse Width: 0.5 ms
Lead Channel Pacing Threshold Pulse Width: 0.5 ms
Lead Channel Pacing Threshold Pulse Width: 0.5 ms
Lead Channel Sensing Intrinsic Amplitude: 1.2 mV
Lead Channel Sensing Intrinsic Amplitude: 9.7 mV
Lead Channel Setting Pacing Amplitude: 3.5 V
Lead Channel Setting Pacing Amplitude: 3.5 V
Lead Channel Setting Pacing Pulse Width: 0.5 ms
Lead Channel Setting Sensing Sensitivity: 2 mV
Pulse Gen Model: 2272
Pulse Gen Serial Number: 3813093

## 2020-01-20 NOTE — Progress Notes (Signed)
Wound check appointment. Steri-strips removed. Wound without redness or edema. Incision edges approximated, wound well healed. Normal device function. Thresholds, sensing, and impedances consistent with implant measurements. Device programmed at 3.5V/auto capture programmed on for extra safety margin until 3 month visit. Histogram distribution appropriate for patient and level of activity. No mode switches or high ventricular rates noted. Patient educated about wound care, arm mobility, lifting restrictions. ROV in 3 months with Dr. Curt Bears 7/29. Next remote 7/19.  Pt continues to have frequent PVCs. Consider additional BB titration at visit next week.   [name] [address] [name], PA-C  01/20/2020 10:14 AM

## 2020-01-22 DIAGNOSIS — H353221 Exudative age-related macular degeneration, left eye, with active choroidal neovascularization: Secondary | ICD-10-CM | POA: Diagnosis not present

## 2020-01-24 ENCOUNTER — Encounter: Payer: Medicare Other | Admitting: Podiatry

## 2020-01-28 ENCOUNTER — Ambulatory Visit (INDEPENDENT_AMBULATORY_CARE_PROVIDER_SITE_OTHER): Payer: Medicare Other | Admitting: Cardiovascular Disease

## 2020-01-28 ENCOUNTER — Encounter: Payer: Self-pay | Admitting: Cardiovascular Disease

## 2020-01-28 ENCOUNTER — Other Ambulatory Visit: Payer: Self-pay

## 2020-01-28 VITALS — BP 126/66 | HR 84 | Ht 65.0 in | Wt 174.0 lb

## 2020-01-28 DIAGNOSIS — R55 Syncope and collapse: Secondary | ICD-10-CM

## 2020-01-28 DIAGNOSIS — Z853 Personal history of malignant neoplasm of breast: Secondary | ICD-10-CM | POA: Diagnosis not present

## 2020-01-28 DIAGNOSIS — Z923 Personal history of irradiation: Secondary | ICD-10-CM | POA: Diagnosis not present

## 2020-01-28 NOTE — Progress Notes (Signed)
Cardiology Office Note  Date:  01/28/2020   ID:  Kerri Mills, DOB 1931-07-24, MRN [id number]  PCP:  Einar Pheasant, MD   Chief Complaint  Patient presents with  . other    3 month follow up. Meds reviewed by the pt. verbally. Pt. c/o weakness since the pacemaker implant.     HPI:  Kerri Mills is a 86 y.o. female with  persistent atrial fibrillation, on anticoagulation SSS, pacer hypertension,  hyperlipidemia,  Demand ischemia Shortness of breath LBBB,  OSA,  breast cancer s/p lumpectomy and XRT,  on the left Hospital admission September 30, 2017 for chest pain Evaluated by cardiology for atrial fibrillation with RVR and chest pain  She presents today for follow-up after recent hospitalization, follow-up of her chest pain and A. Fib.  January 08, 2020 when she was walking in the cafeteria at her assisted living facility and had sudden onset of shortness of breath and lightheadedness. She was trying to get to the nearest table but suddenly lost consciousness  Pauses 6 sec on zio monitor off metoprolol  Implantation of a [employer] Jude [employer] MRI model [id number] (serial number [id number] ) pacemaker on 01/10/20 by Dr. Curt Bears  Got weak, Sore on left at pacer site, Slowly healing starting walking  Lots of PVCs on EKG did not take metoprolol this AM  Walks her dog, little bit of SOB after walk Little cough at nigh when laying down at night Off lisinopril  Lasix on Tuesday and sat, 20 with potassium Swelling left leg from trauma dvt 1992, and recent fall  Lab work reviewed with her in detail Total Chol 168/ LDL 93 HBA1C 6.1  EKG personally reviewed by myself on todays visit Paced, PVCs  Other past medical hx Echo done on 12/10/2018 that showed mild wall thickening and bilateral bronchiectasis   Seen in the hospital 09/30/2017 Nauseous chest pressure short of breath and tremulous.   called EMS and was found to be in atrial fibrillation with rates in the 120s.    admitted at [facility] where she was in atrial fibrillation at 120 bpm.   LBBB was noted on EKG.   INR the day prior to admission was 3.1.   started on a diltiazem infusion  orthopnea overnight.  Bradycardia on diltiazem and metoprolol   PMH:   has a past medical history of Allergy, Anxiety, Arthritis, Atypical chest pain, Clotting disorder ([place]), Colitis, Depression, Diverticulitis (2013), Gastric ulcer, GERD (gastroesophageal reflux disease), History of echocardiogram, Hypercholesterolemia, Hypertension, Infiltrating lobular carcinoma of left breast (2011), Melanoma ([place]) (1997), Melanoma in situ of upper extremity ([place]) (03/19/2011), Persistent atrial fibrillation ([place]), Personal history of radiation therapy (2011), Seroma, Sleep apnea, and Thyroid cancer ([place]) (1992).  PSH:    Past Surgical History:  Procedure Laterality Date  . ABDOMINAL HYSTERECTOMY  1973   partial  . BREAST BIOPSY Left 02-13-13   BENIGN BREAST TISSUE WITH CHANGES CONSISTENT WITH FAT NECROSIS  . BREAST BIOPSY Left 01/21/2015   bx done in [facility] office 11:00 left 6-8cmfn  . BREAST EXCISIONAL BIOPSY Left 1995   neg  . BREAST EXCISIONAL BIOPSY Left 2011   Breast cancer radiation  . BREAST LUMPECTOMY Left 2011   BREAST CA  . CARDIAC CATHETERIZATION    . CHOLECYSTECTOMY    . COLONOSCOPY  2013  . MELANOMA EXCISION     RT UPPER ARM  . PACEMAKER IMPLANT N/A 01/10/2020   Procedure: PACEMAKER IMPLANT;  Surgeon: Constance Haw, MD;  Location: [facility];  Service: Cardiovascular;  Laterality: N/A;  . PARTIAL HYSTERECTOMY     bleeding, ovaries in place.    . THYROID SURGERY  1992   FOR THYROID CANCER  . TONSILLECTOMY      Current Outpatient Medications  Medication Sig Dispense Refill  . albuterol (PROAIR HFA) 108 (90 Base) MCG/ACT inhaler Inhale 2 puffs into the lungs every 6 (six) hours as needed for wheezing or shortness of breath. 1 Inhaler 3  . Cholecalciferol (VITAMIN D) 1000 UNITS capsule Take 1,000  Units by mouth daily.      . DULoxetine (CYMBALTA) 60 MG capsule TAKE 1 CAPSULE (60 MG TOTAL) BY MOUTH AT BEDTIME. 90 capsule 1  . ELIQUIS 5 MG TABS tablet TAKE 1 TABLET BY MOUTH TWICE A DAY (Patient taking differently: Take 5 mg by mouth 2 (two) times daily. ) 180 tablet 1  . furosemide (LASIX) 20 MG tablet TAKE 1 TABLET (20 MG TOTAL) BY MOUTH AS DIRECTED. TAKE 1 TABLET (20 MG) TWICE A WEEK WITH POTASSIUM (Patient taking differently: Take 20 mg by mouth See admin instructions. Take 1 tablet (20 mg) twice a week on Tuesdays and Saturdays  with potassium) 24 tablet 3  . gabapentin (NEURONTIN) 300 MG capsule TAKE 2 CAPSULES (600 MG TOTAL) BY MOUTH AT BEDTIME. (Patient taking differently: Take 600 mg by mouth at bedtime. ) 180 capsule 1  . levothyroxine (SYNTHROID) 88 MCG tablet Take 1 tablet (88 mcg total) by mouth daily before breakfast. TAKE ONE TABLET BY MOUTH ONCE DAILY BEFORE BREAKFAST (Patient taking differently: Take 88 mcg by mouth daily before breakfast. )    . lovastatin (MEVACOR) 40 MG tablet Take 1 tablet (40 mg total) by mouth at bedtime.    . metoprolol tartrate (LOPRESSOR) 25 MG tablet Take 1 tablet (25 mg total) by mouth 2 (two) times daily. 60 tablet 3  . Multiple Vitamins-Minerals (PRESERVISION AREDS 2) CAPS Take 1 tablet by mouth 2 (two) times daily.     Marland Kitchen omeprazole (PRILOSEC) 20 MG capsule TAKE 1 CAPSULE BY MOUTH EVERY DAY (Patient taking differently: Take 20 mg by mouth daily. ) 90 capsule 3  . Polyethyl Glycol-Propyl Glycol (SYSTANE OP) Place 1 drop into both eyes daily as needed (for dry eyes).    . potassium chloride (KLOR-CON) 10 MEQ tablet TAKE 2 TABLETS (20 MEQ TOTAL) BY MOUTH AS DIRECTED. TAKE 2 TABLETS (20 MEQ) TWICE A WEEK WITH LASIX (Patient taking differently: Take 20 mEq by mouth as directed. Take 2 tablets (20 mEq) twice a week on Tuesdays and Saturdays with Lasix) 36 tablet 0  . White Petrolatum-Mineral Oil (GENTEAL TEARS NIGHT-TIME OP) Place 1 application into both eyes  at bedtime. Night time ointment 3.5g      No current facility-administered medications for this visit.     Allergies:   Atorvastatin, Lipitor [atorvastatin calcium], and Penicillins   Social History:  The patient  reports that she has never smoked. She has never used smokeless tobacco. She reports current alcohol use. She reports that she does not use drugs.   Family History:   family history includes Cancer in her brother and sister; Heart disease in her mother.   Review of Systems  Constitutional: Negative.   HENT: Negative.   Eyes: Negative.   Respiratory: Negative.  Negative for shortness of breath.   Cardiovascular: Negative.   Gastrointestinal: Negative.   Genitourinary: Negative.   Musculoskeletal: Negative.   Neurological: Negative.  Negative for dizziness and headaches.  Psychiatric/Behavioral: Negative.   All  other systems reviewed and are negative.   PHYSICAL EXAM: VS:  BP 126/66 (BP Location: Left Arm, Patient Position: Sitting, Cuff Size: Normal)   Pulse 84   Ht 5\' 5"  (1.651 m)   Wt 174 lb (78.9 kg)   SpO2 97%   BMI 28.96 kg/m  , BMI Body mass index is 28.96 kg/m.  Constitutional:  oriented to person, place, and time. No distress.  HENT:  Head: Grossly normal Eyes:  no discharge. No scleral icterus.  Neck: No JVD, no carotid bruits  Cardiovascular: Regular rate and rhythm, no murmurs appreciated Pulmonary/Chest: Clear to auscultation bilaterally, no wheezes or rails Abdominal: Soft.  no distension.  no tenderness.  Musculoskeletal: Normal range of motion Neurological:  normal muscle tone. Coordination normal. No atrophy Skin: Skin warm and dry Psychiatric: normal affect, pleasant  Recent Labs: 01/08/2020: ALT 15; TSH 1.439 01/10/2020: BUN 13; Creatinine, Ser 0.74; Hemoglobin 13.0; Magnesium 2.1; Platelets 210; Potassium 4.0; Sodium 141    Lipid Panel Lab Results  Component Value Date   CHOL 175 10/21/2019   HDL 33 (L) 10/21/2019   LDLCALC 103 (H)  10/21/2019   TRIG 195 (H) 10/21/2019      Wt Readings from Last 3 Encounters:  01/28/20 174 lb (78.9 kg)  01/11/20 172 lb 4.8 oz (78.2 kg)  01/08/20 180 lb (81.6 kg)      ASSESSMENT AND PLAN:  Persistent atrial fibrillation ([place]) - Plan: EKG 12-Lead Maintaining normal sinus rhythm Tolerating anticoagulation, eliquis  On metoprolol  Frequent PVCs Noted on EKG today, did not take her metoprolol this morning She is asymptomatic Normal ejection fraction Lab work normal 2 weeks ago We will continue to monitor  Essential hypertension - Blood pressure is well controlled on today's visit. No changes made to the medications.  Pure hypercholesterolemia -  Cholesterol is at goal on the current lipid regimen. No changes to the medications were made.  Chronic cough More at night  echocardiogram with her showing normal LV function CT scan chest results reviewed with her showing bilateral bronchiectasis Off lisinopril Seen by pulmonary    Total encounter time more than 25 minutes  Greater than 50% was spent in counseling and coordination of care with the patient   Disposition: F/U  6 to 9 months    Orders Placed This Encounter  Procedures  . EKG 12-Lead    I, Heide Scales am acting as a scribe for Ida Rogue, M.D., Ph.D.  I, Ida Rogue, M.D. Ph.D., have reviewed the above documentation for accuracy and completeness, and I agree with the above.   Signed, Esmond Plants, M.D., Ph.D. 01/28/2020  [facility] [place], [place] [phone]

## 2020-01-28 NOTE — Patient Instructions (Signed)
Medication Instructions:  No changes  If you need a refill on your cardiac medications before your next appointment, please call your pharmacy.    Lab work: No new labs needed   If you have labs (blood work) drawn today and your tests are completely normal, you will receive your results only by: Marland Kitchen MyChart Message (if you have MyChart) OR . A paper copy in the mail If you have any lab test that is abnormal or we need to change your treatment, we will call you to review the results.   Testing/Procedures: No new testing needed   Follow-Up: At [facility], you and your health needs are our priority.  As part of our continuing mission to provide you with exceptional heart care, we have created designated Provider Care Teams.  These Care Teams include your primary Cardiologist (physician) and Advanced Practice Providers (APPs -  Physician Assistants and Nurse Practitioners) who all work together to provide you with the care you need, when you need it.  . You will need a follow up appointment in 6 to 9 months   . Providers on your designated Care Team:   . Murray Hodgkins, NP . Christell Faith, PA-C . Marrianne Mood, PA-C  Any Other Special Instructions Will Be Listed Below (If Applicable).  For educational health videos Log in to : [email or web address] Or : [email or web address], password : [email or web address]

## 2020-01-30 ENCOUNTER — Other Ambulatory Visit: Payer: Self-pay | Admitting: Internal Medicine

## 2020-02-07 ENCOUNTER — Encounter: Payer: Medicare Other | Admitting: Podiatry

## 2020-02-28 ENCOUNTER — Telehealth: Payer: Self-pay

## 2020-02-28 NOTE — Telephone Encounter (Signed)
Patient called wanted Dr. Amalia Hailey to know that she had to cancel her surgery on 01/09/20 because she had to have a pacemaker.  She wanted to know when she could reschedule her surgery.  Per Dr. Amalia Hailey, she will have to wait 6 months and then need cardiac clearance from cardiologist.  She verbalized understanding

## 2020-03-01 ENCOUNTER — Other Ambulatory Visit: Payer: Self-pay | Admitting: Cardiovascular Disease

## 2020-03-02 ENCOUNTER — Other Ambulatory Visit: Payer: Self-pay

## 2020-03-02 ENCOUNTER — Telehealth: Payer: Self-pay | Admitting: Internal Medicine

## 2020-03-02 ENCOUNTER — Ambulatory Visit (INDEPENDENT_AMBULATORY_CARE_PROVIDER_SITE_OTHER): Payer: Medicare Other | Admitting: Nurse Practitioner

## 2020-03-02 ENCOUNTER — Encounter: Payer: Self-pay | Admitting: Nurse Practitioner

## 2020-03-02 VITALS — BP 140/80 | HR 69 | Temp 97.5°F | Ht 65.0 in | Wt 175.8 lb

## 2020-03-02 DIAGNOSIS — R198 Other specified symptoms and signs involving the digestive system and abdomen: Secondary | ICD-10-CM

## 2020-03-02 DIAGNOSIS — K1379 Other lesions of oral mucosa: Secondary | ICD-10-CM

## 2020-03-02 DIAGNOSIS — F419 Anxiety disorder, unspecified: Secondary | ICD-10-CM | POA: Diagnosis not present

## 2020-03-02 DIAGNOSIS — K131 Cheek and lip biting: Secondary | ICD-10-CM | POA: Insufficient documentation

## 2020-03-02 NOTE — Telephone Encounter (Signed)
Moderna Covid Vaccine Information 1st dose 10/08/19 2nd dose 11/05/19

## 2020-03-02 NOTE — Telephone Encounter (Signed)
Called and spoke with patient.  HST Appointment scheduled for Thurs 03/12/2020 at 2:00 at the [facility].  Pt is aware to go to the [place] and ask for an Escort to the [facility].  Pt voiced understanding.  Nothing else needed at this time. Rhonda J Cobb

## 2020-03-02 NOTE — Progress Notes (Signed)
Established Patient Office Visit  Subjective:  Patient ID: Kerri Mills, female    DOB: June 24, 1931  Age: 86 y.o. MRN: [id number]  CC:  Chief Complaint  Patient presents with  . Acute Visit    chewing inside of jaw    HPI [name] presents for chewing the inside of her cheeks while asleep. Onset a few weeks ago and it happens almost every night- jaws sore, and two marks on each buccal surface where she has been biting her cheeks. She saw blood when she rinsed.   She wonders if it is from sleep apnea as she doesn't have the machine anymore. She saw Dr. Mortimer Fries and declined re-test that he recommend in Feb. She has considered seeing her dentist to see if a mouth guard will help.The patient does acknowledge that she has  been under a great deal of stress lately.  She is safe at home.  She is fully functional.  Patient's been taking her Cymbalta 60 mg dose for many decades.  She has been thinking about her advanced age, and worried about mortality.  Her daughter's health has been a problem but that has improved.  She just has nonspecific general worry.  She has thought about talking to somebody about that.  She tested low for depression.  She has no suicidal ideation.  Past Medical History:  Diagnosis Date  . Allergy   . Anxiety   . Arthritis   . Atypical chest pain    a. 10/2018 MV: small, fixed apical defect possibly 2/2 attenuation artifact. No ischemia.  EF 68%.  . Clotting disorder ([place])   . Colitis   . Depression   . Diverticulitis 2013  . Gastric ulcer   . GERD (gastroesophageal reflux disease)   . History of echocardiogram    a. 09/2019 Echo: EF 55-60%, mod LVH. Mildly dil LA. Triv MR/TR.  Marland Kitchen Hypercholesterolemia   . Hypertension   . Infiltrating lobular carcinoma of left breast 2011   T2,N0, ER: 90%; PR 0%; Her 2 neu not amplified. [facility]).  . Melanoma ([place]) 1997  . Melanoma in situ of upper extremity ([place]) 03/19/2011  . Persistent atrial fibrillation ([employer])    a. CHADS2VASc => 4 (HTN, age x 2, female)  . Personal history of radiation therapy 2011   BREAST CA  . Seroma    HISTORY OF LFT BREAST  . Sleep apnea   . Thyroid cancer ([place]) 1992    Past Surgical History:  Procedure Laterality Date  . ABDOMINAL HYSTERECTOMY  1973   partial  . BREAST BIOPSY Left 02-13-13   BENIGN BREAST TISSUE WITH CHANGES CONSISTENT WITH FAT NECROSIS  . BREAST BIOPSY Left 01/21/2015   bx done in [facility] office 11:00 left 6-8cmfn  . BREAST EXCISIONAL BIOPSY Left 1995   neg  . BREAST EXCISIONAL BIOPSY Left 2011   Breast cancer radiation  . BREAST LUMPECTOMY Left 2011   BREAST CA  . CARDIAC CATHETERIZATION    . CHOLECYSTECTOMY    . COLONOSCOPY  2013  . MELANOMA EXCISION     RT UPPER ARM  . PACEMAKER IMPLANT N/A 01/10/2020   Procedure: PACEMAKER IMPLANT;  Surgeon: Constance Haw, MD;  Location: [place] [facility];  Service: Cardiovascular;  Laterality: N/A;  . PARTIAL HYSTERECTOMY     bleeding, ovaries in place.    . THYROID SURGERY  1992   FOR THYROID CANCER  . TONSILLECTOMY      Family History  Problem Relation Age  of Onset  . Heart disease Mother   . Cancer Brother        lung   . Cancer Sister        breast  . Breast cancer Neg Hx     Social History   Socioeconomic History  . Marital status: Widowed    Spouse name: Not on file  . Number of children: Not on file  . Years of education: Not on file  . Highest education level: Not on file  Occupational History  . Not on file  Tobacco Use  . Smoking status: Never Smoker  . Smokeless tobacco: Never Used  Substance and Sexual Activity  . Alcohol use: Yes    Alcohol/week: 0.0 standard drinks    Comment: social drinking. average times a week  . Drug use: No  . Sexual activity: Never  Other Topics Concern  . Not on file  Social History Narrative   Independent and baseline. Lives by herself   Social Determinants of Health   Financial Resource Strain:   . Difficulty of Paying Living  Expenses:   Food Insecurity:   . Worried About Charity fundraiser in the Last Year:   . Arboriculturist in the Last Year:   Transportation Needs:   . Film/video editor (Medical):   Marland Kitchen Lack of Transportation (Non-Medical):   Physical Activity:   . Days of Exercise per Week:   . Minutes of Exercise per Session:   Stress:   . Feeling of Stress :   Social Connections:   . Frequency of Communication with Friends and Family:   . Frequency of Social Gatherings with Friends and Family:   . Attends Religious Services:   . Active Member of Clubs or Organizations:   . Attends Archivist Meetings:   Marland Kitchen Marital Status:   Intimate Partner Violence:   . Fear of Current or Ex-Partner:   . Emotionally Abused:   Marland Kitchen Physically Abused:   . Sexually Abused:     Outpatient Medications Prior to Visit  Medication Sig Dispense Refill  . albuterol (PROAIR HFA) 108 (90 Base) MCG/ACT inhaler Inhale 2 puffs into the lungs every 6 (six) hours as needed for wheezing or shortness of breath. 1 Inhaler 3  . Cholecalciferol (VITAMIN D) 1000 UNITS capsule Take 1,000 Units by mouth daily.      . DULoxetine (CYMBALTA) 60 MG capsule TAKE 1 CAPSULE (60 MG TOTAL) BY MOUTH AT BEDTIME. 90 capsule 1  . ELIQUIS 5 MG TABS tablet TAKE 1 TABLET BY MOUTH TWICE A DAY (Patient taking differently: Take 5 mg by mouth 2 (two) times daily. ) 180 tablet 1  . gabapentin (NEURONTIN) 300 MG capsule TAKE 2 CAPSULES (600 MG TOTAL) BY MOUTH AT BEDTIME. 180 capsule 1  . levothyroxine (SYNTHROID) 88 MCG tablet Take 1 tablet (88 mcg total) by mouth daily before breakfast. TAKE ONE TABLET BY MOUTH ONCE DAILY BEFORE BREAKFAST (Patient taking differently: Take 88 mcg by mouth daily before breakfast. )    . lovastatin (MEVACOR) 40 MG tablet Take 1 tablet (40 mg total) by mouth at bedtime.    . metoprolol tartrate (LOPRESSOR) 25 MG tablet Take 1 tablet (25 mg total) by mouth 2 (two) times daily. 60 tablet 3  . Multiple Vitamins-Minerals  (PRESERVISION AREDS 2) CAPS Take 1 tablet by mouth 2 (two) times daily.     Marland Kitchen omeprazole (PRILOSEC) 20 MG capsule TAKE 1 CAPSULE BY MOUTH EVERY DAY (Patient taking differently: Take  20 mg by mouth daily. ) 90 capsule 3  . Polyethyl Glycol-Propyl Glycol (SYSTANE OP) Place 1 drop into both eyes daily as needed (for dry eyes).    . potassium chloride (KLOR-CON) 10 MEQ tablet TAKE 2 TABLETS (20 MEQ TOTAL) BY MOUTH AS DIRECTED. TAKE 2 TABLETS (20 MEQ) TWICE A WEEK WITH LASIX 36 tablet 1  . White Petrolatum-Mineral Oil (GENTEAL TEARS NIGHT-TIME OP) Place 1 application into both eyes at bedtime. Night time ointment 3.5g     . furosemide (LASIX) 20 MG tablet TAKE 1 TABLET (20 MG TOTAL) BY MOUTH AS DIRECTED. TAKE 1 TABLET (20 MG) TWICE A WEEK WITH POTASSIUM (Patient taking differently: Take 20 mg by mouth See admin instructions. Take 1 tablet (20 mg) twice a week on Tuesdays and Saturdays  with potassium) 24 tablet 3   No facility-administered medications prior to visit.    Allergies  Allergen Reactions  . Atorvastatin Other (See Comments)    Other reaction(s): Other (See Comments) STIFFNESS AND SORE STIFFNESS AND SORE  . Lipitor [Atorvastatin Calcium] Other (See Comments)    Stiffness & soreness  . Penicillins Rash    REACTION: Unknown reaction    ROS Review of Systems  Constitutional: Negative for chills, fever and unexpected weight change.  HENT: Negative for congestion.   Eyes: Negative.   Respiratory: Negative for cough and shortness of breath.   Cardiovascular: Negative for chest pain.  Gastrointestinal: Negative for abdominal pain.  Genitourinary: Negative for difficulty urinating.  Psychiatric/Behavioral:       Positive stress and worry.      Objective:    Physical Exam  Constitutional: She is oriented to person, place, and time. She appears well-developed and well-nourished.  HENT:  Head: Normocephalic and atraumatic.  She is biting her cheeks-both buccal surfaces have on  isolated sore where her teeth pinch the tissue.   Cardiovascular: Normal rate and regular rhythm.  Pulmonary/Chest: Effort normal and breath sounds normal.  Musculoskeletal:     Cervical back: Normal range of motion and neck supple.  Lymphadenopathy:    She has no cervical adenopathy.  Neurological: She is alert and oriented to person, place, and time.  Skin: Skin is warm and dry.  Psychiatric:  GAD-7: 12 PHQ-9: 1  No SI or HI.  Vitals reviewed.   BP 140/80 (BP Location: Left Arm, Patient Position: Sitting, Cuff Size: Normal)   Pulse 69   Temp (!) 97.5 F (36.4 C) (Skin)   Ht 5\' 5"  (1.651 m)   Wt 175 lb 12.8 oz (79.7 kg)   SpO2 96%   BMI 29.25 kg/m  Wt Readings from Last 3 Encounters:  03/02/20 175 lb 12.8 oz (79.7 kg)  01/28/20 174 lb (78.9 kg)  01/11/20 172 lb 4.8 oz (78.2 kg)     Health Maintenance Due  Topic Date Due  . COVID-19 Vaccine (1) Never done    There are no preventive care reminders to display for this patient.     Assessment & Plan:   Problem List Items Addressed This Visit      Other   Mucosal irritation of oral cavity - Primary   Cheek biting   Anxiety   Relevant Orders   Ambulatory referral to Psychiatry      No orders of the defined types were placed in this encounter.   She is tense and biting her cheeks at night. Please check with your dentist about a bite guard.   Check with Dr. Mortimer Fries- and try to get  the sleep test. Good sleep will make you feel better in the day time.   To help the cheek sores heal - try an over the counter antacid- like Maalox. Swish and SPIT.  Avoid citrus and acid food and drinks.   Referral to psychiatry for stress and worry.  She is taking her anxiety medication as ordered.  She has been on Cymbalta for several decades.  Request psychiatry opinion for change in medication or additional medication in this 86 year old patient.Do not want to cause her to have any dizziness or fall risk.  Follow-up: Return in  about 4 weeks (around 03/30/2020).  This visit occurred during the SARS-CoV-2 public health emergency.  Safety protocols were in place, including screening questions prior to the visit, additional usage of staff PPE, and extensive cleaning of exam room while observing appropriate contact time as indicated for disinfecting solutions.   Denice Paradise, NP

## 2020-03-02 NOTE — Patient Instructions (Addendum)
She is tense and biting her cheeks at night. Please check with your dentist about a bite guard.   Check with Dr. Mortimer Fries- and try to get the sleep test. Good sleep will make you feel better in the day time.   To help the cheek sores heal - try an over the counter antacid- like Maalox. Swish and SPIT.  Avoid citrus and acid food and drinks.   Referral to psychiatry for stress and worry.       Managing Anxiety, Adult After being diagnosed with an anxiety disorder, you may be relieved to know why you have felt or behaved a certain way. You may also feel overwhelmed about the treatment ahead and what it will mean for your life. With care and support, you can manage this condition and recover from it. How to manage lifestyle changes Managing stress and anxiety  Stress is your body's reaction to life changes and events, both good and bad. Most stress will last just a few hours, but stress can be ongoing and can lead to more than just stress. Although stress can play a major role in anxiety, it is not the same as anxiety. Stress is usually caused by something external, such as a deadline, test, or competition. Stress normally passes after the triggering event has ended.  Anxiety is caused by something internal, such as imagining a terrible outcome or worrying that something will go wrong that will devastate you. Anxiety often does not go away even after the triggering event is over, and it can become long-term (chronic) worry. It is important to understand the differences between stress and anxiety and to manage your stress effectively so that it does not lead to an anxious response. Talk with your health care provider or a counselor to learn more about reducing anxiety and stress. He or she may suggest tension reduction techniques, such as:  Music therapy. This can include creating or listening to music that you enjoy and that inspires you.  Mindfulness-based meditation. This involves being aware of  your normal breaths while not trying to control your breathing. It can be done while sitting or walking.  Centering prayer. This involves focusing on a word, phrase, or sacred image that means something to you and brings you peace.  Deep breathing. To do this, expand your stomach and inhale slowly through your nose. Hold your breath for 3-5 seconds. Then exhale slowly, letting your stomach muscles relax.  Self-talk. This involves identifying thought patterns that lead to anxiety reactions and changing those patterns.  Muscle relaxation. This involves tensing muscles and then relaxing them. Choose a tension reduction technique that suits your lifestyle and personality. These techniques take time and practice. Set aside 5-15 minutes a day to do them. Therapists can offer counseling and training in these techniques. The training to help with anxiety may be covered by some insurance plans. Other things you can do to manage stress and anxiety include:  Keeping a stress/anxiety diary. This can help you learn what triggers your reaction and then learn ways to manage your response.  Thinking about how you react to certain situations. You may not be able to control everything, but you can control your response.  Making time for activities that help you relax and not feeling guilty about spending your time in this way.  Visual imagery and yoga can help you stay calm and relax.  Medicines Medicines can help ease symptoms. Medicines for anxiety include:  Anti-anxiety drugs.  Antidepressants. Medicines are often  used as a primary treatment for anxiety disorder. Medicines will be prescribed by a health care provider. When used together, medicines, psychotherapy, and tension reduction techniques may be the most effective treatment. Relationships Relationships can play a big part in helping you recover. Try to spend more time connecting with trusted friends and family members. Consider going to couples  counseling, taking family education classes, or going to family therapy. Therapy can help you and others better understand your condition. How to recognize changes in your anxiety Everyone responds differently to treatment for anxiety. Recovery from anxiety happens when symptoms decrease and stop interfering with your daily activities at home or work. This may mean that you will start to:  Have better concentration and focus. Worry will interfere less in your daily thinking.  Sleep better.  Be less irritable.  Have more energy.  Have improved memory. It is important to recognize when your condition is getting worse. Contact your health care provider if your symptoms interfere with home or work and you feel like your condition is not improving. Follow these instructions at home: Activity  Exercise. Most adults should do the following: ? Exercise for at least 150 minutes each week. The exercise should increase your heart rate and make you sweat (moderate-intensity exercise). ? Strengthening exercises at least twice a week.  Get the right amount and quality of sleep. Most adults need 7-9 hours of sleep each night. Lifestyle   Eat a healthy diet that includes plenty of vegetables, fruits, whole grains, low-fat dairy products, and lean protein. Do not eat a lot of foods that are high in solid fats, added sugars, or salt.  Make choices that simplify your life.  Do not use any products that contain nicotine or tobacco, such as cigarettes, e-cigarettes, and chewing tobacco. If you need help quitting, ask your health care provider.  Avoid caffeine, alcohol, and certain over-the-counter cold medicines. These may make you feel worse. Ask your pharmacist which medicines to avoid. General instructions  Take over-the-counter and prescription medicines only as told by your health care provider.  Keep all follow-up visits as told by your health care provider. This is important. Where to find  support You can get help and support from these sources:  Self-help groups.  Online and [employer].  A trusted spiritual leader.  Couples counseling.  Family education classes.  Family therapy. Where to find more information You may find that joining a support group helps you deal with your anxiety. The following sources can help you locate counselors or support groups near you:  [place]: www.mentalhealthamerica.net  Anxiety and Depression Association of [place] (ADAA): https://www.clark.net/  National Alliance on Mental Illness (NAMI): www.nami.org Contact a health care provider if you:  Have a hard time staying focused or finishing daily tasks.  Spend many hours a day feeling worried about everyday life.  Become exhausted by worry.  Start to have headaches, feel tense, or have nausea.  Urinate more than normal.  Have diarrhea. Get help right away if you have:  A racing heart and shortness of breath.  Thoughts of hurting yourself or others. If you ever feel like you may hurt yourself or others, or have thoughts about taking your own life, get help right away. You can go to your nearest emergency department or call:  Your local emergency services (911 in the [place]  A suicide crisis helpline, such as the [place] at 9794941877. This is open 24 hours a day. Summary  Taking steps to learn and use tension reduction techniques can help calm you and help prevent triggering an anxiety reaction.  When used together, medicines, psychotherapy, and tension reduction techniques may be the most effective treatment.  Family, friends, and partners can play a big part in helping you recover from an anxiety disorder. This information is not intended to replace advice given to you by your health care provider. Make sure you discuss any questions you have with your health care provider. Document Revised: 02/12/2019 Document Reviewed:  02/12/2019 Elsevier Patient Education  [place].

## 2020-03-02 NOTE — Telephone Encounter (Signed)
Chart updated

## 2020-03-04 ENCOUNTER — Other Ambulatory Visit: Payer: Self-pay | Admitting: Internal Medicine

## 2020-03-05 ENCOUNTER — Telehealth: Payer: Self-pay | Admitting: Nurse Practitioner

## 2020-03-05 NOTE — Telephone Encounter (Signed)
Please let her know that Dr. Nicolasa Ducking recommended  a therapist who can see Kerri Mills in person:  [address] [place],  [place] [address]

## 2020-03-05 NOTE — Telephone Encounter (Signed)
Spoke with patient and information given to patient

## 2020-03-05 NOTE — Telephone Encounter (Signed)
LMTCB

## 2020-03-12 ENCOUNTER — Other Ambulatory Visit: Payer: Self-pay

## 2020-03-12 ENCOUNTER — Ambulatory Visit: Payer: Medicare Other

## 2020-03-12 DIAGNOSIS — G4733 Obstructive sleep apnea (adult) (pediatric): Secondary | ICD-10-CM | POA: Diagnosis not present

## 2020-03-12 DIAGNOSIS — G4719 Other hypersomnia: Secondary | ICD-10-CM

## 2020-03-17 DIAGNOSIS — C50012 Malignant neoplasm of nipple and areola, left female breast: Secondary | ICD-10-CM | POA: Diagnosis not present

## 2020-03-24 ENCOUNTER — Telehealth: Payer: Self-pay | Admitting: Internal Medicine

## 2020-03-24 DIAGNOSIS — R2681 Unsteadiness on feet: Secondary | ICD-10-CM | POA: Diagnosis not present

## 2020-03-24 DIAGNOSIS — M6281 Muscle weakness (generalized): Secondary | ICD-10-CM | POA: Diagnosis not present

## 2020-03-24 NOTE — Telephone Encounter (Signed)
Pt is aware that HST has not been read as of yet. Pt is aware that she will receive a call with results once read by provider. Pt is aware that Dr. Halford Chessman is currently on vacation, as he typically reads our [place] office sleep studies.   Pt voiced her understanding.  Nothing further is needed.

## 2020-03-25 DIAGNOSIS — M6281 Muscle weakness (generalized): Secondary | ICD-10-CM | POA: Diagnosis not present

## 2020-03-25 DIAGNOSIS — R2681 Unsteadiness on feet: Secondary | ICD-10-CM | POA: Diagnosis not present

## 2020-03-26 ENCOUNTER — Ambulatory Visit: Payer: Medicare Other | Admitting: Internal Medicine

## 2020-03-27 DIAGNOSIS — R2681 Unsteadiness on feet: Secondary | ICD-10-CM | POA: Diagnosis not present

## 2020-03-27 DIAGNOSIS — M6281 Muscle weakness (generalized): Secondary | ICD-10-CM | POA: Diagnosis not present

## 2020-03-30 ENCOUNTER — Telehealth: Payer: Self-pay | Admitting: Pulmonary Disease

## 2020-03-30 DIAGNOSIS — R2681 Unsteadiness on feet: Secondary | ICD-10-CM | POA: Diagnosis not present

## 2020-03-30 DIAGNOSIS — G4733 Obstructive sleep apnea (adult) (pediatric): Secondary | ICD-10-CM

## 2020-03-30 DIAGNOSIS — M6281 Muscle weakness (generalized): Secondary | ICD-10-CM | POA: Diagnosis not present

## 2020-03-30 NOTE — Telephone Encounter (Signed)
Home sleep study from 03/13/20 showed severe obstructive sleep apnea with an AHI of 34.8 and SpO2 low of 66%.  Will route to Dr. Mortimer Fries to follow up with patient.

## 2020-03-31 ENCOUNTER — Ambulatory Visit (INDEPENDENT_AMBULATORY_CARE_PROVIDER_SITE_OTHER): Payer: Medicare Other | Admitting: Internal Medicine

## 2020-03-31 ENCOUNTER — Other Ambulatory Visit: Payer: Self-pay

## 2020-03-31 DIAGNOSIS — D649 Anemia, unspecified: Secondary | ICD-10-CM

## 2020-03-31 DIAGNOSIS — E7800 Pure hypercholesterolemia, unspecified: Secondary | ICD-10-CM

## 2020-03-31 DIAGNOSIS — G4733 Obstructive sleep apnea (adult) (pediatric): Secondary | ICD-10-CM | POA: Diagnosis not present

## 2020-03-31 DIAGNOSIS — R739 Hyperglycemia, unspecified: Secondary | ICD-10-CM | POA: Diagnosis not present

## 2020-03-31 DIAGNOSIS — M79621 Pain in right upper arm: Secondary | ICD-10-CM | POA: Diagnosis not present

## 2020-03-31 DIAGNOSIS — I4891 Unspecified atrial fibrillation: Secondary | ICD-10-CM

## 2020-03-31 DIAGNOSIS — Z85850 Personal history of malignant neoplasm of thyroid: Secondary | ICD-10-CM

## 2020-03-31 DIAGNOSIS — M6281 Muscle weakness (generalized): Secondary | ICD-10-CM | POA: Diagnosis not present

## 2020-03-31 DIAGNOSIS — Z853 Personal history of malignant neoplasm of breast: Secondary | ICD-10-CM

## 2020-03-31 DIAGNOSIS — K219 Gastro-esophageal reflux disease without esophagitis: Secondary | ICD-10-CM

## 2020-03-31 DIAGNOSIS — J411 Mucopurulent chronic bronchitis: Secondary | ICD-10-CM | POA: Diagnosis not present

## 2020-03-31 DIAGNOSIS — R55 Syncope and collapse: Secondary | ICD-10-CM

## 2020-03-31 DIAGNOSIS — F439 Reaction to severe stress, unspecified: Secondary | ICD-10-CM

## 2020-03-31 DIAGNOSIS — I10 Essential (primary) hypertension: Secondary | ICD-10-CM

## 2020-03-31 NOTE — Telephone Encounter (Signed)
Will await Dr. Kasa's response.  

## 2020-03-31 NOTE — Telephone Encounter (Signed)
Pt is aware of results and voiced her understanding.  Pt wished to proceed with cpap.  Order has been placed to adapt, per pt request. Recall has been placed for 45mo with Dr. Mortimer Fries.  Nothing further is needed

## 2020-03-31 NOTE — Telephone Encounter (Signed)
Hi Margie, can we follow Dr Collie Siad Recs?  Or we can start AutoCPAP 5-12 cm h20

## 2020-03-31 NOTE — Progress Notes (Signed)
Patient ID: Kerri Mills, female   DOB: 1931/07/12, 86 y.o.   MRN: [id number]   Subjective:    Patient ID: Kerri Mills, female    DOB: 1931/02/25, 86 y.o.   MRN: [id number]  HPI This visit occurred during the SARS-CoV-2 public health emergency.  Safety protocols were in place, including screening questions prior to the visit, additional usage of staff PPE, and extensive cleaning of exam room while observing appropriate contact time as indicated for disinfecting solutions.  Patient here for a scheduled follow up. Recently diagnosed with sleep apnea.  Seeing pulmonary.  Planning - cpap.  Also saw her dentist.  Mouth guard.  She is walking.  Walks at least 1/2 mile per day.  Sometimes more.  States three weeks ago, she did not eat and had not slept.  Went walking.  Had episode where she was trembling and shaking.  Pressure elevated.  Was evaluated at her place of residence.  Has not had another episode since.  She is s/p implantation of a pacemaker - 01/10/20.  If followed by cardiology.  No chest pain or increased sob reported.  Breathing stable.  Eating.  No vomiting or abdominal pain reported.  Overall feels things are stable.  Does report occasional nose bleed.  Last two weeks ago.   Past Medical History:  Diagnosis Date  . Allergy   . Anxiety   . Arthritis   . Atypical chest pain    a. 10/2018 MV: small, fixed apical defect possibly 2/2 attenuation artifact. No ischemia.  EF 68%.  . Clotting disorder ([place])   . Colitis   . Depression   . Diverticulitis 2013  . Gastric ulcer   . GERD (gastroesophageal reflux disease)   . History of echocardiogram    a. 09/2019 Echo: EF 55-60%, mod LVH. Mildly dil LA. Triv MR/TR.  Marland Kitchen Hypercholesterolemia   . Hypertension   . Infiltrating lobular carcinoma of left breast 2011   T2,N0, ER: 90%; PR 0%; Her 2 neu not amplified. [facility]).  . Melanoma ([place]) 1997  . Melanoma in situ of upper extremity ([place]) 03/19/2011  . Persistent atrial fibrillation  ([place])    a. CHADS2VASc => 4 (HTN, age x 2, female)  . Personal history of radiation therapy 2011   BREAST CA  . Seroma    HISTORY OF LFT BREAST  . Sleep apnea   . Thyroid cancer ([place]) 1992   Past Surgical History:  Procedure Laterality Date  . ABDOMINAL HYSTERECTOMY  1973   partial  . BREAST BIOPSY Left 02-13-13   BENIGN BREAST TISSUE WITH CHANGES CONSISTENT WITH FAT NECROSIS  . BREAST BIOPSY Left 01/21/2015   bx done in [facility] office 11:00 left 6-8cmfn  . BREAST EXCISIONAL BIOPSY Left 1995   neg  . BREAST EXCISIONAL BIOPSY Left 2011   Breast cancer radiation  . BREAST LUMPECTOMY Left 2011   BREAST CA  . CARDIAC CATHETERIZATION    . CHOLECYSTECTOMY    . COLONOSCOPY  2013  . MELANOMA EXCISION     RT UPPER ARM  . PACEMAKER IMPLANT N/A 01/10/2020   Procedure: PACEMAKER IMPLANT;  Surgeon: Constance Haw, MD;  Location: [place] [facility];  Service: Cardiovascular;  Laterality: N/A;  . PARTIAL HYSTERECTOMY     bleeding, ovaries in place.    . THYROID SURGERY  1992   FOR THYROID CANCER  . TONSILLECTOMY     Family History  Problem Relation Age of Onset  . Heart disease Mother   .  Cancer Brother        lung   . Cancer Sister        breast  . Breast cancer Neg Hx    Social History   Socioeconomic History  . Marital status: Widowed    Spouse name: Not on file  . Number of children: Not on file  . Years of education: Not on file  . Highest education level: Not on file  Occupational History  . Not on file  Tobacco Use  . Smoking status: Never Smoker  . Smokeless tobacco: Never Used  Substance and Sexual Activity  . Alcohol use: Yes    Alcohol/week: 0.0 standard drinks    Comment: social drinking. average times a week  . Drug use: No  . Sexual activity: Never  Other Topics Concern  . Not on file  Social History Narrative   Independent and baseline. Lives by herself   Social Determinants of Health   Financial Resource Strain:   . Difficulty of Paying  Living Expenses:   Food Insecurity:   . Worried About Charity fundraiser in the Last Year:   . Arboriculturist in the Last Year:   Transportation Needs:   . Film/video editor (Medical):   Marland Kitchen Lack of Transportation (Non-Medical):   Physical Activity:   . Days of Exercise per Week:   . Minutes of Exercise per Session:   Stress:   . Feeling of Stress :   Social Connections:   . Frequency of Communication with Friends and Family:   . Frequency of Social Gatherings with Friends and Family:   . Attends Religious Services:   . Active Member of Clubs or Organizations:   . Attends Archivist Meetings:   Marland Kitchen Marital Status:     Outpatient Encounter Medications as of 03/31/2020  Medication Sig  . albuterol (PROAIR HFA) 108 (90 Base) MCG/ACT inhaler Inhale 2 puffs into the lungs every 6 (six) hours as needed for wheezing or shortness of breath.  . Cholecalciferol (VITAMIN D) 1000 UNITS capsule Take 1,000 Units by mouth daily.    . DULoxetine (CYMBALTA) 60 MG capsule TAKE 1 CAPSULE (60 MG TOTAL) BY MOUTH AT BEDTIME.  Marland Kitchen ELIQUIS 5 MG TABS tablet TAKE 1 TABLET BY MOUTH TWICE A DAY (Patient taking differently: Take 5 mg by mouth 2 (two) times daily. )  . furosemide (LASIX) 20 MG tablet TAKE 1 TABLET (20 MG TOTAL) BY MOUTH AS DIRECTED. TAKE 1 TABLET (20 MG) TWICE A WEEK WITH POTASSIUM (Patient taking differently: Take 20 mg by mouth See admin instructions. Take 1 tablet (20 mg) twice a week on Tuesdays and Saturdays  with potassium)  . gabapentin (NEURONTIN) 300 MG capsule TAKE 2 CAPSULES (600 MG TOTAL) BY MOUTH AT BEDTIME.  Marland Kitchen levothyroxine (SYNTHROID) 88 MCG tablet TAKE ONE TABLET BY MOUTH ONCE DAILY BEFORE BREAKFAST  . lovastatin (MEVACOR) 40 MG tablet Take 1 tablet (40 mg total) by mouth at bedtime.  . Multiple Vitamins-Minerals (PRESERVISION AREDS 2) CAPS Take 1 tablet by mouth 2 (two) times daily.   Marland Kitchen omeprazole (PRILOSEC) 20 MG capsule TAKE 1 CAPSULE BY MOUTH EVERY DAY (Patient taking  differently: Take 20 mg by mouth daily. )  . Polyethyl Glycol-Propyl Glycol (SYSTANE OP) Place 1 drop into both eyes daily as needed (for dry eyes).  . potassium chloride (KLOR-CON) 10 MEQ tablet TAKE 2 TABLETS (20 MEQ TOTAL) BY MOUTH AS DIRECTED. TAKE 2 TABLETS (20 MEQ) TWICE A WEEK WITH LASIX  .  White Petrolatum-Mineral Oil (GENTEAL TEARS NIGHT-TIME OP) Place 1 application into both eyes at bedtime. Night time ointment 3.5g   . [DISCONTINUED] metoprolol tartrate (LOPRESSOR) 25 MG tablet Take 1 tablet (25 mg total) by mouth 2 (two) times daily.   No facility-administered encounter medications on file as of 03/31/2020.    Review of Systems  Constitutional: Negative for appetite change and unexpected weight change.  HENT: Negative for congestion and sinus pressure.   Respiratory: Negative for cough, chest tightness and shortness of breath.   Cardiovascular: Negative for chest pain, palpitations and leg swelling.  Gastrointestinal: Negative for abdominal pain, diarrhea, nausea and vomiting.  Genitourinary: Negative for difficulty urinating and dysuria.  Musculoskeletal: Negative for joint swelling and myalgias.  Skin: Negative for color change and rash.  Neurological: Negative for dizziness, light-headedness and headaches.  Psychiatric/Behavioral: Negative for agitation and dysphoric mood.       Objective:    Physical Exam Vitals reviewed.  Constitutional:      General: She is not in acute distress.    Appearance: Normal appearance.  HENT:     Head: Normocephalic and atraumatic.     Right Ear: External ear normal.  Eyes:     General: No scleral icterus.       Right eye: No discharge.        Left eye: No discharge.     Conjunctiva/sclera: Conjunctivae normal.  Neck:     Thyroid: No thyromegaly.  Cardiovascular:     Rate and Rhythm: Normal rate and regular rhythm.  Pulmonary:     Effort: No respiratory distress.     Breath sounds: Normal breath sounds. No wheezing.  Abdominal:       General: Bowel sounds are normal.     Palpations: Abdomen is soft.     Tenderness: There is no abdominal tenderness.  Musculoskeletal:        General: No swelling or tenderness.     Cervical back: Neck supple. No tenderness.  Lymphadenopathy:     Cervical: No cervical adenopathy.  Skin:    Findings: No erythema or rash.  Neurological:     Mental Status: She is alert.  Psychiatric:        Mood and Affect: Mood normal.     BP 128/72   Pulse 71   Temp 97.8 F (36.6 C)   Resp 16   Ht _0  (1.651 m)   Wt 179 lb (81.2 kg)   SpO2 97%   BMI 29.79 kg/m  Wt Readings from Last 3 Encounters:  03/31/20 179 lb (81.2 kg)  03/02/20 175 lb 12.8 oz (79.7 kg)  01/28/20 174 lb (78.9 kg)     Lab Results  Component Value Date   WBC 6.7 01/10/2020   HGB 13.0 01/10/2020   HCT 40.6 01/10/2020   PLT 210 01/10/2020   GLUCOSE 119 (H) 01/10/2020   CHOL 175 10/21/2019   TRIG 195 (H) 10/21/2019   HDL 33 (L) 10/21/2019   LDLDIRECT 75.0 03/24/2017   LDLCALC 103 (H) 10/21/2019   ALT 15 01/08/2020   AST 20 01/08/2020   NA 141 01/10/2020   K 4.0 01/10/2020   CL 104 01/10/2020   CREATININE 0.74 01/10/2020   BUN 13 01/10/2020   CO2 26 01/10/2020   TSH 1.439 01/08/2020   INR 1.5 (H) 11/21/2018   HGBA1C 6.3 08/08/2019    DG Chest 2 View  Result Date: 01/11/2020 CLINICAL DATA:  Pacemaker placement EXAM: CHEST - 2 VIEW COMPARISON:  None. FINDINGS:  Left chest wall pacemaker with leads in expected locations. The heart size and mediastinal contours are within normal limits. Both lungs are clear. No pneumothorax. The visualized skeletal structures are unremarkable. IMPRESSION: Left chest pacemaker placement. No pneumothorax. Electronically Signed   By: Ulyses Jarred M.D.   On: 01/11/2020 06:44   EP PPM/ICD IMPLANT  Result Date: 01/10/2020 SURGEON:  Allegra Lai, MD   PREPROCEDURE DIAGNOSIS:  Sick sinus syndrome   POSTPROCEDURE DIAGNOSIS:  Sick sinus syndrome    PROCEDURES:  1. Pacemaker  implantation.   INTRODUCTION:  MADAI NUCCIO is a 86 y.o. female with a history of bradycardia who presents today for pacemaker implantation.  The patient reports intermittent episodes of dizziness and syncope over the past few months.  No reversible causes have been identified.  The patient therefore presents today for pacemaker implantation.   DESCRIPTION OF PROCEDURE:  Informed written consent was obtained, and  the patient was brought to the electrophysiology lab in a fasting state.  The patient required no sedation for the procedure today.  The patients left chest was prepped and draped in the usual sterile fashion by the EP lab staff. The skin overlying the left deltopectoral region was infiltrated with lidocaine for local analgesia.  A 4-cm incision was made over the left deltopectoral region.  A left subcutaneous pacemaker pocket was fashioned using a combination of sharp and blunt dissection. Electrocautery was required to assure hemostasis.  RA/RV Lead Placement: The left axillary vein was therefore cannulated.  Through the left axillary vein, a [employer] model Tendril MRI [id number] (serial number  [id number]) right atrial lead and a [employer] model Tendril MRI [id number] (serial number  [id number]) right ventricular lead were advanced with fluoroscopic visualization into the right atrial appendage and right ventricular apex positions respectively.  Initial atrial lead P- waves measured 3.7 mV with impedance of 591 ohms and a threshold of 0.8 V at 0.5 msec.  Right ventricular lead R-waves measured 15.1 mV with an impedance of 877 ohms and a threshold of 0.5 V at 0.5 msec.  Both leads were secured to the pectoralis fascia using #2-0 silk over the suture sleeves. Device Placement:  The leads were then connected to a [place] MRI  model [id number] (serial number  [id number] ) pacemaker.  The pocket was irrigated with copious gentamicin solution.  The pacemaker was then placed into the pocket.   The pocket was then closed in 3 layers with 2.0 Vicryl suture for the 3.0 Vicryl suture subcutaneous and subcuticular layers.  Steri-  Strips and a sterile dressing were then applied. EBL<54m.  There were no early apparent complications.   CONCLUSIONS:  1. Successful implantation of a [employer] Assurity MRI dual-chamber pacemaker for symptomatic bradycardia  2. No early apparent complications.       Will CCurt Bears MD 01/10/2020 4:51 PM       Assessment & Plan:   Problem List Items Addressed This Visit    Anemia    Follow cbc.       [place]rial fibrillation [place]    On eliquis.  S/p pacemaker placement.  Takes metoprolol.  Follow.  Continue f/u with cardiology.  Stable.       Essential hypertension    Blood pressure as outlined.  Reports off lisinopril.  Takes lasix prn.  On metoprolol.  Follow pressures.  Follow metabolic panel.       Relevant Orders   Basic metabolic panel   GERD (gastroesophageal reflux disease)  Controlled.  On omeprazole.       History of breast cancer    Mammogram 12/20/19 - Birads II.       History of thyroid cancer    S/p XRT.  On thyroid replacement.  Follow tsh.       Hypercholesterolemia    On lovastatin.  Low cholesterol diet and exercise.  Follow lipid panel and liver function tests.        Relevant Orders   Hepatic function panel   Lipid panel   Hyperglycemia    Low carb diet and exercise as tolerated.  Follow met b and a1c.       Relevant Orders   Hemoglobin A1c   Mucopurulent chronic bronchitis ([place])    Followed by pulmonary.  Breathing stable.       Obstructive sleep apnea    Found to have severe sleep apnea.  Seeing pulmonary.  Planning - cpap.        Stress    Overall appears to be doing ok.  Stable.  On cymbalta.       Syncope    Previous syncope.  S/p pacemaker placement.  Followed by cardiology.  Had the recent episode as outlined.  Did not pass out.  Feels related to not sleeping and not eating.  Overall feels things  are stable.  Wants to follow.            Einar Pheasant, MD

## 2020-04-01 ENCOUNTER — Telehealth: Payer: Self-pay | Admitting: Internal Medicine

## 2020-04-01 ENCOUNTER — Other Ambulatory Visit: Payer: Self-pay

## 2020-04-01 DIAGNOSIS — M6281 Muscle weakness (generalized): Secondary | ICD-10-CM | POA: Diagnosis not present

## 2020-04-01 DIAGNOSIS — M79621 Pain in right upper arm: Secondary | ICD-10-CM | POA: Diagnosis not present

## 2020-04-01 MED ORDER — NYSTATIN 100000 UNIT/GM EX CREA: 1.0000 "application " | TOPICAL_CREAM | Freq: Two times a day (BID) | CUTANEOUS | 0 refills | Status: DC

## 2020-04-01 NOTE — Telephone Encounter (Signed)
Pt has a rash under her breast and wanted something called in to [facility]

## 2020-04-01 NOTE — Telephone Encounter (Signed)
Discussed with patient at appt yesterday. Needed nystatin cream refilled- uses prn under breasts and abdominal folds. She is out. I have sent in for her.

## 2020-04-02 DIAGNOSIS — M6281 Muscle weakness (generalized): Secondary | ICD-10-CM | POA: Diagnosis not present

## 2020-04-02 DIAGNOSIS — R2681 Unsteadiness on feet: Secondary | ICD-10-CM | POA: Diagnosis not present

## 2020-04-03 ENCOUNTER — Other Ambulatory Visit: Payer: Self-pay | Admitting: Physician Assistant

## 2020-04-03 DIAGNOSIS — M6281 Muscle weakness (generalized): Secondary | ICD-10-CM | POA: Diagnosis not present

## 2020-04-03 DIAGNOSIS — R2681 Unsteadiness on feet: Secondary | ICD-10-CM | POA: Diagnosis not present

## 2020-04-03 DIAGNOSIS — M79621 Pain in right upper arm: Secondary | ICD-10-CM | POA: Diagnosis not present

## 2020-04-03 NOTE — Telephone Encounter (Signed)
This is [place] pt

## 2020-04-06 ENCOUNTER — Encounter: Payer: Self-pay | Admitting: Internal Medicine

## 2020-04-06 DIAGNOSIS — M79621 Pain in right upper arm: Secondary | ICD-10-CM | POA: Diagnosis not present

## 2020-04-06 DIAGNOSIS — R2681 Unsteadiness on feet: Secondary | ICD-10-CM | POA: Diagnosis not present

## 2020-04-06 DIAGNOSIS — M6281 Muscle weakness (generalized): Secondary | ICD-10-CM | POA: Diagnosis not present

## 2020-04-06 NOTE — Assessment & Plan Note (Signed)
Low carb diet and exercise as tolerated.  Follow met b and a1c.  

## 2020-04-06 NOTE — Assessment & Plan Note (Signed)
Mammogram 12/20/19 - Birads II.

## 2020-04-06 NOTE — Assessment & Plan Note (Signed)
On lovastatin.  Low cholesterol diet and exercise.  Follow lipid panel and liver function tests.   

## 2020-04-06 NOTE — Assessment & Plan Note (Signed)
On eliquis.  S/p pacemaker placement.  Takes metoprolol.  Follow.  Continue f/u with cardiology.  Stable.

## 2020-04-06 NOTE — Assessment & Plan Note (Signed)
Blood pressure as outlined.  Reports off lisinopril.  Takes lasix prn.  On metoprolol.  Follow pressures.  Follow metabolic panel.

## 2020-04-06 NOTE — Assessment & Plan Note (Signed)
Controlled.  On omeprazole.   

## 2020-04-06 NOTE — Assessment & Plan Note (Signed)
Previous syncope.  S/p pacemaker placement.  Followed by cardiology.  Had the recent episode as outlined.  Did not pass out.  Feels related to not sleeping and not eating.  Overall feels things are stable.  Wants to follow.

## 2020-04-06 NOTE — Assessment & Plan Note (Signed)
S/p XRT.  On thyroid replacement.  Follow tsh.

## 2020-04-06 NOTE — Assessment & Plan Note (Signed)
Followed by pulmonary.  Breathing stable.   

## 2020-04-06 NOTE — Assessment & Plan Note (Signed)
Found to have severe sleep apnea.  Seeing pulmonary.  Planning - cpap.

## 2020-04-06 NOTE — Assessment & Plan Note (Signed)
Follow cbc.  

## 2020-04-06 NOTE — Assessment & Plan Note (Signed)
Overall appears to be doing ok.  Stable.  On cymbalta.

## 2020-04-07 DIAGNOSIS — M6281 Muscle weakness (generalized): Secondary | ICD-10-CM | POA: Diagnosis not present

## 2020-04-07 DIAGNOSIS — M79621 Pain in right upper arm: Secondary | ICD-10-CM | POA: Diagnosis not present

## 2020-04-07 DIAGNOSIS — R2681 Unsteadiness on feet: Secondary | ICD-10-CM | POA: Diagnosis not present

## 2020-04-09 DIAGNOSIS — M6281 Muscle weakness (generalized): Secondary | ICD-10-CM | POA: Diagnosis not present

## 2020-04-09 DIAGNOSIS — R0602 Shortness of breath: Secondary | ICD-10-CM | POA: Diagnosis not present

## 2020-04-09 DIAGNOSIS — M79621 Pain in right upper arm: Secondary | ICD-10-CM | POA: Diagnosis not present

## 2020-04-09 DIAGNOSIS — J410 Simple chronic bronchitis: Secondary | ICD-10-CM | POA: Diagnosis not present

## 2020-04-09 DIAGNOSIS — G4733 Obstructive sleep apnea (adult) (pediatric): Secondary | ICD-10-CM | POA: Diagnosis not present

## 2020-04-09 DIAGNOSIS — J479 Bronchiectasis, uncomplicated: Secondary | ICD-10-CM | POA: Diagnosis not present

## 2020-04-10 DIAGNOSIS — R2681 Unsteadiness on feet: Secondary | ICD-10-CM | POA: Diagnosis not present

## 2020-04-10 DIAGNOSIS — M6281 Muscle weakness (generalized): Secondary | ICD-10-CM | POA: Diagnosis not present

## 2020-04-12 DIAGNOSIS — M6281 Muscle weakness (generalized): Secondary | ICD-10-CM | POA: Diagnosis not present

## 2020-04-12 DIAGNOSIS — M79621 Pain in right upper arm: Secondary | ICD-10-CM | POA: Diagnosis not present

## 2020-04-13 DIAGNOSIS — M6281 Muscle weakness (generalized): Secondary | ICD-10-CM | POA: Diagnosis not present

## 2020-04-13 DIAGNOSIS — R2681 Unsteadiness on feet: Secondary | ICD-10-CM | POA: Diagnosis not present

## 2020-04-14 DIAGNOSIS — M6281 Muscle weakness (generalized): Secondary | ICD-10-CM | POA: Diagnosis not present

## 2020-04-14 DIAGNOSIS — R2681 Unsteadiness on feet: Secondary | ICD-10-CM | POA: Diagnosis not present

## 2020-04-14 DIAGNOSIS — M79621 Pain in right upper arm: Secondary | ICD-10-CM | POA: Diagnosis not present

## 2020-04-15 DIAGNOSIS — M79621 Pain in right upper arm: Secondary | ICD-10-CM | POA: Diagnosis not present

## 2020-04-15 DIAGNOSIS — M6281 Muscle weakness (generalized): Secondary | ICD-10-CM | POA: Diagnosis not present

## 2020-04-16 DIAGNOSIS — M79621 Pain in right upper arm: Secondary | ICD-10-CM | POA: Diagnosis not present

## 2020-04-16 DIAGNOSIS — M6281 Muscle weakness (generalized): Secondary | ICD-10-CM | POA: Diagnosis not present

## 2020-04-17 DIAGNOSIS — R2681 Unsteadiness on feet: Secondary | ICD-10-CM | POA: Diagnosis not present

## 2020-04-17 DIAGNOSIS — M6281 Muscle weakness (generalized): Secondary | ICD-10-CM | POA: Diagnosis not present

## 2020-04-19 DIAGNOSIS — M79621 Pain in right upper arm: Secondary | ICD-10-CM | POA: Diagnosis not present

## 2020-04-19 DIAGNOSIS — M6281 Muscle weakness (generalized): Secondary | ICD-10-CM | POA: Diagnosis not present

## 2020-04-20 DIAGNOSIS — M79621 Pain in right upper arm: Secondary | ICD-10-CM | POA: Diagnosis not present

## 2020-04-20 DIAGNOSIS — M6281 Muscle weakness (generalized): Secondary | ICD-10-CM | POA: Diagnosis not present

## 2020-04-20 DIAGNOSIS — R2681 Unsteadiness on feet: Secondary | ICD-10-CM | POA: Diagnosis not present

## 2020-04-21 ENCOUNTER — Ambulatory Visit (INDEPENDENT_AMBULATORY_CARE_PROVIDER_SITE_OTHER): Payer: Medicare Other | Admitting: *Deleted

## 2020-04-21 DIAGNOSIS — I495 Sick sinus syndrome: Secondary | ICD-10-CM | POA: Diagnosis not present

## 2020-04-21 DIAGNOSIS — R2681 Unsteadiness on feet: Secondary | ICD-10-CM | POA: Diagnosis not present

## 2020-04-21 DIAGNOSIS — M6281 Muscle weakness (generalized): Secondary | ICD-10-CM | POA: Diagnosis not present

## 2020-04-21 LAB — CUP PACEART REMOTE DEVICE CHECK
Battery Remaining Longevity: 71 mo
Battery Remaining Percentage: 95.5 %
Battery Voltage: 2.99 V
Brady Statistic AP VP Percent: 1.9 %
Brady Statistic AP VS Percent: 66 %
Brady Statistic AS VP Percent: 1 %
Brady Statistic AS VS Percent: 28 %
Brady Statistic RA Percent Paced: 60 %
Brady Statistic RV Percent Paced: 2.6 %
Date Time Interrogation Session: 20210726151107
Implantable Lead Implant Date: 20210416
Implantable Lead Implant Date: 20210416
Implantable Lead Location: 753859
Implantable Lead Location: 753860
Implantable Pulse Generator Implant Date: 20210416
Lead Channel Impedance Value: 380 Ohm
Lead Channel Impedance Value: 550 Ohm
Lead Channel Pacing Threshold Amplitude: 0.5 V
Lead Channel Pacing Threshold Amplitude: 0.75 V
Lead Channel Pacing Threshold Pulse Width: 0.5 ms
Lead Channel Pacing Threshold Pulse Width: 0.5 ms
Lead Channel Sensing Intrinsic Amplitude: 1.4 mV
Lead Channel Sensing Intrinsic Amplitude: 10.2 mV
Lead Channel Setting Pacing Amplitude: 3.5 V
Lead Channel Setting Pacing Amplitude: 3.5 V
Lead Channel Setting Pacing Pulse Width: 0.5 ms
Lead Channel Setting Sensing Sensitivity: 2 mV
Pulse Gen Model: 2272
Pulse Gen Serial Number: 3813093

## 2020-04-22 DIAGNOSIS — H353221 Exudative age-related macular degeneration, left eye, with active choroidal neovascularization: Secondary | ICD-10-CM | POA: Diagnosis not present

## 2020-04-23 ENCOUNTER — Ambulatory Visit (INDEPENDENT_AMBULATORY_CARE_PROVIDER_SITE_OTHER): Payer: Medicare Other | Admitting: Cardiology

## 2020-04-23 ENCOUNTER — Other Ambulatory Visit: Payer: Self-pay

## 2020-04-23 ENCOUNTER — Encounter: Payer: Self-pay | Admitting: Cardiology

## 2020-04-23 VITALS — BP 118/72 | HR 83 | Ht 65.0 in | Wt 182.4 lb

## 2020-04-23 DIAGNOSIS — I10 Essential (primary) hypertension: Secondary | ICD-10-CM | POA: Diagnosis not present

## 2020-04-23 DIAGNOSIS — I495 Sick sinus syndrome: Secondary | ICD-10-CM | POA: Diagnosis not present

## 2020-04-23 DIAGNOSIS — I4819 Other persistent atrial fibrillation: Secondary | ICD-10-CM

## 2020-04-23 DIAGNOSIS — E7800 Pure hypercholesterolemia, unspecified: Secondary | ICD-10-CM

## 2020-04-23 DIAGNOSIS — R2681 Unsteadiness on feet: Secondary | ICD-10-CM | POA: Diagnosis not present

## 2020-04-23 DIAGNOSIS — M6281 Muscle weakness (generalized): Secondary | ICD-10-CM | POA: Diagnosis not present

## 2020-04-23 LAB — CUP PACEART INCLINIC DEVICE CHECK
Battery Remaining Longevity: 118 mo
Battery Voltage: 2.99 V
Brady Statistic RA Percent Paced: 61 %
Brady Statistic RV Percent Paced: 2.5 %
Date Time Interrogation Session: 20210729141907
Implantable Lead Implant Date: 20210416
Implantable Lead Implant Date: 20210416
Implantable Lead Location: 753859
Implantable Lead Location: 753860
Implantable Pulse Generator Implant Date: 20210416
Lead Channel Impedance Value: 375 Ohm
Lead Channel Impedance Value: 550 Ohm
Lead Channel Pacing Threshold Amplitude: 0.75 V
Lead Channel Pacing Threshold Amplitude: 0.75 V
Lead Channel Pacing Threshold Pulse Width: 0.5 ms
Lead Channel Pacing Threshold Pulse Width: 0.5 ms
Lead Channel Sensing Intrinsic Amplitude: 1.5 mV
Lead Channel Sensing Intrinsic Amplitude: 11.3 mV
Lead Channel Setting Pacing Amplitude: 2 V
Lead Channel Setting Pacing Amplitude: 2.5 V
Lead Channel Setting Pacing Pulse Width: 0.5 ms
Lead Channel Setting Sensing Sensitivity: 2 mV
Pulse Gen Model: 2272
Pulse Gen Serial Number: 3813093

## 2020-04-23 NOTE — Progress Notes (Signed)
Electrophysiology Office Note   Date:  04/23/2020   ID:  Kerri, Mills 12-Aug-1931, MRN [id number]  PCP:  Einar Pheasant, MD  Cardiologist:  Kerri Mills Primary Electrophysiologist:  Kerri Mills Kerri Leeds, MD    Chief Complaint: pacemaker   History of Present Illness: Kerri Mills is a 86 y.o. female who is being seen today for the evaluation of pacemaker at the request of Einar Pheasant, MD. Presenting today for electrophysiology evaluation.  She has a history significant for atrial fibrillation, hypertension, PVCs, tachybradycardia syndrome.,  Obstructive sleep apnea who presented to the hospital April 2021 with tachybradycardia syndrome.  She is now status post [place] dual-chamber pacemaker implanted 01/10/2020.  Today, she denies symptoms of palpitations, chest pain, shortness of breath, orthopnea, PND, lower extremity edema, claudication, dizziness, presyncope, syncope, bleeding, or neurologic sequela. The patient is tolerating medications without difficulties.    Past Medical History:  Diagnosis Date  . Allergy   . Anxiety   . Arthritis   . Atypical chest pain    a. 10/2018 MV: small, fixed apical defect possibly 2/2 attenuation artifact. No ischemia.  EF 68%.  . Clotting disorder ([place])   . Colitis   . Depression   . Diverticulitis 2013  . Gastric ulcer   . GERD (gastroesophageal reflux disease)   . History of echocardiogram    a. 09/2019 Echo: EF 55-60%, mod LVH. Mildly dil LA. Triv MR/TR.  Marland Kitchen Hypercholesterolemia   . Hypertension   . Infiltrating lobular carcinoma of left breast 2011   T2,N0, ER: 90%; PR 0%; Her 2 neu not amplified. [facility]).  . Melanoma ([place]) 1997  . Melanoma in [place] of upper extremity ([place]) 03/19/2011  . Persistent atrial fibrillation ([place])    a. CHADS2VASc => 4 (HTN, age x 2, female)  . Personal history of radiation therapy 2011   BREAST CA  . Seroma    HISTORY OF LFT BREAST  . Sleep apnea   . Thyroid cancer ([place]) 1992   Past  Surgical History:  Procedure Laterality Date  . ABDOMINAL HYSTERECTOMY  1973   partial  . BREAST BIOPSY Left 02-13-13   BENIGN BREAST TISSUE WITH CHANGES CONSISTENT WITH FAT NECROSIS  . BREAST BIOPSY Left 01/21/2015   bx done in [facility] office 11:00 left 6-8cmfn  . BREAST EXCISIONAL BIOPSY Left 1995   neg  . BREAST EXCISIONAL BIOPSY Left 2011   Breast cancer radiation  . BREAST LUMPECTOMY Left 2011   BREAST CA  . CARDIAC CATHETERIZATION    . CHOLECYSTECTOMY    . COLONOSCOPY  2013  . MELANOMA EXCISION     RT UPPER ARM  . PACEMAKER IMPLANT N/A 01/10/2020   Procedure: PACEMAKER IMPLANT;  Surgeon: Constance Haw, MD;  Location: [facility];  Service: Cardiovascular;  Laterality: N/A;  . PARTIAL HYSTERECTOMY     bleeding, ovaries in place.    . THYROID SURGERY  1992   FOR THYROID CANCER  . TONSILLECTOMY       Current Outpatient Medications  Medication Sig Dispense Refill  . albuterol (PROAIR HFA) 108 (90 Base) MCG/ACT inhaler Inhale 2 puffs into the lungs every 6 (six) hours as needed for wheezing or shortness of breath. 1 Inhaler 3  . Cholecalciferol (VITAMIN D) 1000 UNITS capsule Take 1,000 Units by mouth daily.      . DULoxetine (CYMBALTA) 60 MG capsule TAKE 1 CAPSULE (60 MG TOTAL) BY MOUTH AT BEDTIME. 90 capsule 1  . ELIQUIS 5 MG TABS  tablet TAKE 1 TABLET BY MOUTH TWICE A DAY (Patient taking differently: Take 5 mg by mouth 2 (two) times daily. ) 180 tablet 1  . gabapentin (NEURONTIN) 300 MG capsule TAKE 2 CAPSULES (600 MG TOTAL) BY MOUTH AT BEDTIME. 180 capsule 1  . levothyroxine (SYNTHROID) 88 MCG tablet TAKE ONE TABLET BY MOUTH ONCE DAILY BEFORE BREAKFAST 90 tablet 1  . lovastatin (MEVACOR) 40 MG tablet Take 1 tablet (40 mg total) by mouth at bedtime.    . metoprolol tartrate (LOPRESSOR) 25 MG tablet TAKE 1 TABLET BY MOUTH TWICE A DAY 180 tablet 3  . Multiple Vitamins-Minerals (PRESERVISION AREDS 2) CAPS Take 1 tablet by mouth 2 (two) times daily.     Marland Kitchen nystatin  cream (MYCOSTATIN) Apply 1 application topically 2 (two) times daily. 60 g 0  . omeprazole (PRILOSEC) 20 MG capsule TAKE 1 CAPSULE BY MOUTH EVERY DAY (Patient taking differently: Take 20 mg by mouth daily. ) 90 capsule 3  . Polyethyl Glycol-Propyl Glycol (SYSTANE OP) Place 1 drop into both eyes daily as needed (for dry eyes).    . potassium chloride (KLOR-CON) 10 MEQ tablet TAKE 2 TABLETS (20 MEQ TOTAL) BY MOUTH AS DIRECTED. TAKE 2 TABLETS (20 MEQ) TWICE A WEEK WITH LASIX 36 tablet 1  . White Petrolatum-Mineral Oil (GENTEAL TEARS NIGHT-TIME OP) Place 1 application into both eyes at bedtime. Night time ointment 3.5g     . furosemide (LASIX) 20 MG tablet TAKE 1 TABLET (20 MG TOTAL) BY MOUTH AS DIRECTED. TAKE 1 TABLET (20 MG) TWICE A WEEK WITH POTASSIUM (Patient taking differently: Take 20 mg by mouth See admin instructions. Take 1 tablet (20 mg) twice a week on Tuesdays and Saturdays  with potassium) 24 tablet 3   No current facility-administered medications for this visit.    Allergies:   Atorvastatin, Lipitor [atorvastatin calcium], and Penicillins   Social History:  The patient  reports that she has never smoked. She has never used smokeless tobacco. She reports current alcohol use. She reports that she does not use drugs.   Family History:  The patient's family history includes Cancer in her brother and sister; Heart disease in her mother.    ROS:  Please see the history of present illness.   Otherwise, review of systems is positive for none.   All other systems are reviewed and negative.    PHYSICAL EXAM: VS:  BP 118/72   Pulse 83   Ht 5\' 5"  (1.651 m)   Wt 182 lb 6.4 oz (82.7 kg)   SpO2 96%   BMI 30.35 kg/m  , BMI Body mass index is 30.35 kg/m. GEN: Well nourished, well developed, in no acute distress  HEENT: normal  Neck: no JVD, carotid bruits, or masses Cardiac: RRR; no murmurs, rubs, or gallops,no edema  Respiratory:  clear to auscultation bilaterally, normal work of  breathing GI: soft, nontender, nondistended, + BS MS: no deformity or atrophy  Skin: warm and dry, device pocket is well healed Neuro:  Strength and sensation are intact Psych: euthymic mood, full affect  EKG:  EKG is ordered today. Personal review of the ekg ordered shows A paced, PVCs  Device interrogation is reviewed today in detail.  See PaceArt for details.   Recent Labs: 01/08/2020: ALT 15; TSH 1.439 01/10/2020: BUN 13; Creatinine, Ser 0.74; Hemoglobin 13.0; Magnesium 2.1; Platelets 210; Potassium 4.0; Sodium 141    Lipid Panel     Component Value Date/Time   CHOL 175 10/21/2019 0703   TRIG  195 (H) 10/21/2019 0703   HDL 33 (L) 10/21/2019 0703   CHOLHDL 5.3 10/21/2019 0703   VLDL 39 10/21/2019 0703   LDLCALC 103 (H) 10/21/2019 0703   LDLDIRECT 75.0 03/24/2017 1206     Wt Readings from Last 3 Encounters:  04/23/20 182 lb 6.4 oz (82.7 kg)  03/31/20 179 lb (81.2 kg)  03/02/20 175 lb 12.8 oz (79.7 kg)      Other studies Reviewed: Additional studies/ records that were reviewed today include: TTE 10/21/19  Review of the above records today demonstrates:  1. Left ventricular ejection fraction, by visual estimation, is 55 to  60%. The left ventricle has normal function. There is moderately increased  left ventricular hypertrophy.  2. Left ventricular diastolic parameters are indeterminate.  3. The left ventricle has no regional wall motion abnormalities.  4. Global right ventricle has normal systolic function.The right  ventricular size is normal. No increase in right ventricular wall  thickness.  5. Left atrial size was mildly dilated.  6. Right atrial size was normal.  7. The mitral valve is normal in structure. Trivial mitral valve  regurgitation. No evidence of mitral stenosis.  8. The tricuspid valve is normal in structure. Tricuspid valve  regurgitation is trivial.  9. The aortic valve is normal in structure. Aortic valve regurgitation is  not  visualized. Mild to moderate aortic valve sclerosis/calcification  without any evidence of aortic stenosis.  10. The pulmonic valve was normal in structure. Pulmonic valve  regurgitation is not visualized.  11. TR signal is inadequate for assessing pulmonary artery systolic  pressure.  12. The inferior vena cava is normal in size with greater than 50%  respiratory variability, suggesting right atrial pressure of 3 mmHg.    ASSESSMENT AND PLAN:  1.  Tachybradycardia syndrome: Status post [place] dual-chamber pacemaker implanted 01/10/2020.  Device functioning appropriately.  No changes at this time.  2.  Persistent atrial fibrillation: Currently on Eliquis.  CHA2DS2-VASc of 4.  3.  Hyperlipidemia: Statin per primary cardiology  4.  PVCs: Asymptomatic with a normal ejection fraction.  No changes.  Current medicines are reviewed at length with the patient today.   The patient does not have concerns regarding her medicines.  The following changes were made today:  none  Labs/ tests ordered today include:  Orders Placed This Encounter  Procedures  . CUP PACEART [place]  . EKG 12-Lead     Disposition:   FU with Aum Caggiano 9 months  Signed, Shalika Arntz Kerri Leeds, MD  04/23/2020 2:32 PM     [place] [address] [place] [address] [phone] (office) [phone] (fax)

## 2020-04-23 NOTE — Patient Instructions (Signed)
Medication Instructions:  Your physician recommends that you continue on your current medications as directed. Please refer to the Current Medication list given to you today.  *If you need a refill on your cardiac medications before your next appointment, please call your pharmacy*   Lab Work: None Ordered If you have labs (blood work) drawn today and your tests are completely normal, you will receive your results only by: Marland Kitchen MyChart Message (if you have MyChart) OR . A paper copy in the mail If you have any lab test that is abnormal or we need to change your treatment, we will call you to review the results.   Testing/Procedures: None Ordered   Follow-Up: At [facility], you and your health needs are our priority.  As part of our continuing mission to provide you with exceptional heart care, we have created designated Provider Care Teams.  These Care Teams include your primary Cardiologist (physician) and Advanced Practice Providers (APPs -  Physician Assistants and Nurse Practitioners) who all work together to provide you with the care you need, when you need it.  We recommend signing up for the patient portal called "MyChart".  Sign up information is provided on this After Visit Summary.  MyChart is used to connect with patients for Virtual Visits (Telemedicine).  Patients are able to view lab/test results, encounter notes, upcoming appointments, etc.  Non-urgent messages can be sent to your provider as well.   To learn more about what you can do with MyChart, go to [email or web address].    Your next appointment:   9 month(s)  The format for your next appointment:   In Person  Provider:   You may see Will Meredith Leeds, MD or one of the following Advanced Practice Providers on your designated Care Team:    Chanetta Marshall, NP  Tommye Standard, PA-C  [name] "Willard" Herbster, [place]

## 2020-04-24 NOTE — Progress Notes (Signed)
Remote pacemaker transmission.   

## 2020-04-27 DIAGNOSIS — M6281 Muscle weakness (generalized): Secondary | ICD-10-CM | POA: Diagnosis not present

## 2020-04-27 DIAGNOSIS — M79621 Pain in right upper arm: Secondary | ICD-10-CM | POA: Diagnosis not present

## 2020-04-28 ENCOUNTER — Other Ambulatory Visit: Payer: Self-pay | Admitting: Cardiovascular Disease

## 2020-04-28 DIAGNOSIS — R2681 Unsteadiness on feet: Secondary | ICD-10-CM | POA: Diagnosis not present

## 2020-04-28 DIAGNOSIS — M6281 Muscle weakness (generalized): Secondary | ICD-10-CM | POA: Diagnosis not present

## 2020-04-28 NOTE — Telephone Encounter (Signed)
Please review for refill, Thanks !  

## 2020-04-28 NOTE — Telephone Encounter (Signed)
Age 86, weight 83kg, scr 0.74 on 01/10/20, afib indication, OV last week

## 2020-04-29 DIAGNOSIS — M6281 Muscle weakness (generalized): Secondary | ICD-10-CM | POA: Diagnosis not present

## 2020-04-29 DIAGNOSIS — M79621 Pain in right upper arm: Secondary | ICD-10-CM | POA: Diagnosis not present

## 2020-04-29 DIAGNOSIS — R2681 Unsteadiness on feet: Secondary | ICD-10-CM | POA: Diagnosis not present

## 2020-04-30 DIAGNOSIS — M79621 Pain in right upper arm: Secondary | ICD-10-CM | POA: Diagnosis not present

## 2020-04-30 DIAGNOSIS — R2681 Unsteadiness on feet: Secondary | ICD-10-CM | POA: Diagnosis not present

## 2020-04-30 DIAGNOSIS — M6281 Muscle weakness (generalized): Secondary | ICD-10-CM | POA: Diagnosis not present

## 2020-05-04 DIAGNOSIS — M79621 Pain in right upper arm: Secondary | ICD-10-CM | POA: Diagnosis not present

## 2020-05-04 DIAGNOSIS — M6281 Muscle weakness (generalized): Secondary | ICD-10-CM | POA: Diagnosis not present

## 2020-05-05 DIAGNOSIS — M79621 Pain in right upper arm: Secondary | ICD-10-CM | POA: Diagnosis not present

## 2020-05-05 DIAGNOSIS — M6281 Muscle weakness (generalized): Secondary | ICD-10-CM | POA: Diagnosis not present

## 2020-05-05 DIAGNOSIS — R2681 Unsteadiness on feet: Secondary | ICD-10-CM | POA: Diagnosis not present

## 2020-05-06 DIAGNOSIS — M79621 Pain in right upper arm: Secondary | ICD-10-CM | POA: Diagnosis not present

## 2020-05-06 DIAGNOSIS — M6281 Muscle weakness (generalized): Secondary | ICD-10-CM | POA: Diagnosis not present

## 2020-05-07 DIAGNOSIS — R2681 Unsteadiness on feet: Secondary | ICD-10-CM | POA: Diagnosis not present

## 2020-05-07 DIAGNOSIS — M6281 Muscle weakness (generalized): Secondary | ICD-10-CM | POA: Diagnosis not present

## 2020-05-10 DIAGNOSIS — J479 Bronchiectasis, uncomplicated: Secondary | ICD-10-CM | POA: Diagnosis not present

## 2020-05-10 DIAGNOSIS — G4733 Obstructive sleep apnea (adult) (pediatric): Secondary | ICD-10-CM | POA: Diagnosis not present

## 2020-05-10 DIAGNOSIS — J410 Simple chronic bronchitis: Secondary | ICD-10-CM | POA: Diagnosis not present

## 2020-05-10 DIAGNOSIS — R0602 Shortness of breath: Secondary | ICD-10-CM | POA: Diagnosis not present

## 2020-05-11 DIAGNOSIS — M6281 Muscle weakness (generalized): Secondary | ICD-10-CM | POA: Diagnosis not present

## 2020-05-11 DIAGNOSIS — M79621 Pain in right upper arm: Secondary | ICD-10-CM | POA: Diagnosis not present

## 2020-05-12 DIAGNOSIS — M6281 Muscle weakness (generalized): Secondary | ICD-10-CM | POA: Diagnosis not present

## 2020-05-12 DIAGNOSIS — M79621 Pain in right upper arm: Secondary | ICD-10-CM | POA: Diagnosis not present

## 2020-05-12 DIAGNOSIS — R2681 Unsteadiness on feet: Secondary | ICD-10-CM | POA: Diagnosis not present

## 2020-05-14 DIAGNOSIS — M6281 Muscle weakness (generalized): Secondary | ICD-10-CM | POA: Diagnosis not present

## 2020-05-14 DIAGNOSIS — R2681 Unsteadiness on feet: Secondary | ICD-10-CM | POA: Diagnosis not present

## 2020-05-15 DIAGNOSIS — M6281 Muscle weakness (generalized): Secondary | ICD-10-CM | POA: Diagnosis not present

## 2020-05-15 DIAGNOSIS — R2681 Unsteadiness on feet: Secondary | ICD-10-CM | POA: Diagnosis not present

## 2020-05-15 DIAGNOSIS — M79621 Pain in right upper arm: Secondary | ICD-10-CM | POA: Diagnosis not present

## 2020-05-18 DIAGNOSIS — M6281 Muscle weakness (generalized): Secondary | ICD-10-CM | POA: Diagnosis not present

## 2020-05-18 DIAGNOSIS — M79621 Pain in right upper arm: Secondary | ICD-10-CM | POA: Diagnosis not present

## 2020-05-21 DIAGNOSIS — L570 Actinic keratosis: Secondary | ICD-10-CM | POA: Diagnosis not present

## 2020-05-21 DIAGNOSIS — B372 Candidiasis of skin and nail: Secondary | ICD-10-CM | POA: Diagnosis not present

## 2020-05-21 DIAGNOSIS — L821 Other seborrheic keratosis: Secondary | ICD-10-CM | POA: Diagnosis not present

## 2020-05-21 DIAGNOSIS — M6281 Muscle weakness (generalized): Secondary | ICD-10-CM | POA: Diagnosis not present

## 2020-05-21 DIAGNOSIS — M79621 Pain in right upper arm: Secondary | ICD-10-CM | POA: Diagnosis not present

## 2020-05-22 DIAGNOSIS — M79621 Pain in right upper arm: Secondary | ICD-10-CM | POA: Diagnosis not present

## 2020-05-22 DIAGNOSIS — M6281 Muscle weakness (generalized): Secondary | ICD-10-CM | POA: Diagnosis not present

## 2020-05-25 DIAGNOSIS — M79621 Pain in right upper arm: Secondary | ICD-10-CM | POA: Diagnosis not present

## 2020-05-25 DIAGNOSIS — M6281 Muscle weakness (generalized): Secondary | ICD-10-CM | POA: Diagnosis not present

## 2020-05-26 DIAGNOSIS — M79621 Pain in right upper arm: Secondary | ICD-10-CM | POA: Diagnosis not present

## 2020-05-26 DIAGNOSIS — M6281 Muscle weakness (generalized): Secondary | ICD-10-CM | POA: Diagnosis not present

## 2020-05-28 DIAGNOSIS — M79621 Pain in right upper arm: Secondary | ICD-10-CM | POA: Diagnosis not present

## 2020-05-28 DIAGNOSIS — M6281 Muscle weakness (generalized): Secondary | ICD-10-CM | POA: Diagnosis not present

## 2020-05-30 ENCOUNTER — Other Ambulatory Visit: Payer: Self-pay | Admitting: Internal Medicine

## 2020-06-01 DIAGNOSIS — M6281 Muscle weakness (generalized): Secondary | ICD-10-CM | POA: Diagnosis not present

## 2020-06-01 DIAGNOSIS — M79621 Pain in right upper arm: Secondary | ICD-10-CM | POA: Diagnosis not present

## 2020-06-02 ENCOUNTER — Other Ambulatory Visit (INDEPENDENT_AMBULATORY_CARE_PROVIDER_SITE_OTHER): Payer: Medicare Other

## 2020-06-02 ENCOUNTER — Other Ambulatory Visit: Payer: Self-pay

## 2020-06-02 DIAGNOSIS — E7800 Pure hypercholesterolemia, unspecified: Secondary | ICD-10-CM

## 2020-06-02 DIAGNOSIS — Z85850 Personal history of malignant neoplasm of thyroid: Secondary | ICD-10-CM | POA: Diagnosis not present

## 2020-06-02 DIAGNOSIS — I10 Essential (primary) hypertension: Secondary | ICD-10-CM | POA: Diagnosis not present

## 2020-06-02 DIAGNOSIS — R739 Hyperglycemia, unspecified: Secondary | ICD-10-CM

## 2020-06-02 DIAGNOSIS — M79621 Pain in right upper arm: Secondary | ICD-10-CM | POA: Diagnosis not present

## 2020-06-02 DIAGNOSIS — M6281 Muscle weakness (generalized): Secondary | ICD-10-CM | POA: Diagnosis not present

## 2020-06-02 LAB — COMPREHENSIVE METABOLIC PANEL
ALT: 10 U/L (ref 0–35)
AST: 14 U/L (ref 0–37)
Albumin: 4 g/dL (ref 3.5–5.2)
Alkaline Phosphatase: 130 U/L — ABNORMAL HIGH (ref 39–117)
BUN: 15 mg/dL (ref 6–23)
CO2: 32 mEq/L (ref 19–32)
Calcium: 9.9 mg/dL (ref 8.4–10.5)
Chloride: 103 mEq/L (ref 96–112)
Creatinine, Ser: 0.92 mg/dL (ref 0.40–1.20)
GFR: 57.75 mL/min — ABNORMAL LOW (ref >60.00)
Glucose, Bld: 107 mg/dL — ABNORMAL HIGH (ref 70–99)
Potassium: 4.9 mEq/L (ref 3.5–5.1)
Sodium: 142 mEq/L (ref 135–145)
Total Bilirubin: 0.4 mg/dL (ref 0.2–1.2)
Total Protein: 6.4 g/dL (ref 6.0–8.3)

## 2020-06-02 LAB — LIPID PANEL
Cholesterol: 154 mg/dL (ref 0–200)
HDL: 38.4 mg/dL — ABNORMAL LOW (ref >39.00)
LDL Cholesterol: 86 mg/dL (ref 0–99)
NonHDL: 115.56
Total CHOL/HDL Ratio: 4
Triglycerides: 146 mg/dL (ref 0.0–149.0)
VLDL: 29.2 mg/dL (ref 0.0–40.0)

## 2020-06-02 LAB — HEPATIC FUNCTION PANEL
ALT: 10 U/L (ref 0–35)
AST: 14 U/L (ref 0–37)
Albumin: 4 g/dL (ref 3.5–5.2)
Alkaline Phosphatase: 130 U/L — ABNORMAL HIGH (ref 39–117)
Bilirubin, Direct: 0.1 mg/dL (ref 0.0–0.3)
Total Bilirubin: 0.4 mg/dL (ref 0.2–1.2)
Total Protein: 6.4 g/dL (ref 6.0–8.3)

## 2020-06-02 LAB — TSH: TSH: 4.89 u[IU]/mL — ABNORMAL HIGH (ref 0.35–4.50)

## 2020-06-02 LAB — HEMOGLOBIN A1C: Hgb A1c MFr Bld: 6.3 % (ref 4.6–6.5)

## 2020-06-02 NOTE — Addendum Note (Signed)
Addended by: Ezequiel Ganser on: 06/02/2020 10:00 AM   Modules accepted: Orders

## 2020-06-03 ENCOUNTER — Other Ambulatory Visit: Payer: Self-pay | Admitting: Internal Medicine

## 2020-06-03 DIAGNOSIS — Z85850 Personal history of malignant neoplasm of thyroid: Secondary | ICD-10-CM

## 2020-06-03 NOTE — Progress Notes (Signed)
Order placed for f/u tsh.  

## 2020-06-04 DIAGNOSIS — M6281 Muscle weakness (generalized): Secondary | ICD-10-CM | POA: Diagnosis not present

## 2020-06-04 DIAGNOSIS — M79621 Pain in right upper arm: Secondary | ICD-10-CM | POA: Diagnosis not present

## 2020-06-05 ENCOUNTER — Other Ambulatory Visit: Payer: Self-pay | Admitting: *Deleted

## 2020-06-06 MED ORDER — LEVOTHYROXINE SODIUM 100 MCG PO TABS: 100.0000 ug | ORAL_TABLET | Freq: Every day | ORAL | 1 refills | Status: DC

## 2020-06-06 NOTE — Progress Notes (Signed)
rx sent in for sythroid 173mcg #90 with 1 refill.

## 2020-06-06 NOTE — Addendum Note (Signed)
Addended by: Alisa Graff on: 06/06/2020 02:36 PM   Modules accepted: Orders

## 2020-06-09 DIAGNOSIS — B372 Candidiasis of skin and nail: Secondary | ICD-10-CM | POA: Diagnosis not present

## 2020-06-09 DIAGNOSIS — L578 Other skin changes due to chronic exposure to nonionizing radiation: Secondary | ICD-10-CM | POA: Diagnosis not present

## 2020-06-09 DIAGNOSIS — Z872 Personal history of diseases of the skin and subcutaneous tissue: Secondary | ICD-10-CM | POA: Diagnosis not present

## 2020-06-09 DIAGNOSIS — Z85820 Personal history of malignant melanoma of skin: Secondary | ICD-10-CM | POA: Diagnosis not present

## 2020-06-09 DIAGNOSIS — L570 Actinic keratosis: Secondary | ICD-10-CM | POA: Diagnosis not present

## 2020-06-10 DIAGNOSIS — G4733 Obstructive sleep apnea (adult) (pediatric): Secondary | ICD-10-CM | POA: Diagnosis not present

## 2020-06-10 DIAGNOSIS — R0602 Shortness of breath: Secondary | ICD-10-CM | POA: Diagnosis not present

## 2020-06-10 DIAGNOSIS — J479 Bronchiectasis, uncomplicated: Secondary | ICD-10-CM | POA: Diagnosis not present

## 2020-06-10 DIAGNOSIS — J410 Simple chronic bronchitis: Secondary | ICD-10-CM | POA: Diagnosis not present

## 2020-06-19 ENCOUNTER — Telehealth: Payer: Self-pay | Admitting: Cardiovascular Disease

## 2020-06-19 NOTE — Telephone Encounter (Signed)
Pt c/o swelling: STAT is pt has developed SOB within 24 hours  1) How much weight have you gained and in what time span? n/a  2) If swelling, where is the swelling located? Lower extremities knee down, primarily on left side  3) Are you currently taking a fluid pill? Nurse is unaware  4) Are you currently SOB? Yes, thinks it is more related to bronchitis but still worrisome. Unable to walk up and down steps without being winded  5) Do you have a log of your daily weights (if so, list)? n/a  6) Have you gained 3 pounds in a day or 5 pounds in a week?  9/12 180  9/18 185  7) Have you traveled recently? n/a Lives at [facility], under quarantine and non symptomatic, awaiting covid results

## 2020-06-22 NOTE — Telephone Encounter (Signed)
Spoke with patient and she is currently in quarantine. She states that the doctor covering that site is also on call. She states this lady called her to review some questions and that is what prompted this call. She does have upcoming COVID test along with different appointment. She reports no noted problems but does feel more tired. She is doing fine and they have someone there if needed. Advised to please give [place] a call back if her symptoms persist or worsen along with making the doctor there aware as well. She verbalized understanding with no further questions at this time.

## 2020-06-22 NOTE — Telephone Encounter (Signed)
Attempted to call [facility] community center with no answer. Then tried to call patients number and left voicemail message on that line.

## 2020-06-22 NOTE — Telephone Encounter (Signed)
Left voicemail message to call back and that we would need to call the facility to make the nurse there aware.

## 2020-06-23 ENCOUNTER — Encounter: Payer: Self-pay | Admitting: Internal Medicine

## 2020-06-23 ENCOUNTER — Ambulatory Visit (INDEPENDENT_AMBULATORY_CARE_PROVIDER_SITE_OTHER): Payer: Medicare Other | Admitting: Internal Medicine

## 2020-06-23 ENCOUNTER — Other Ambulatory Visit: Payer: Self-pay

## 2020-06-23 VITALS — BP 138/88 | HR 62 | Temp 96.8°F | Ht 65.0 in | Wt 187.6 lb

## 2020-06-23 DIAGNOSIS — J479 Bronchiectasis, uncomplicated: Secondary | ICD-10-CM | POA: Diagnosis not present

## 2020-06-23 DIAGNOSIS — J42 Unspecified chronic bronchitis: Secondary | ICD-10-CM | POA: Diagnosis not present

## 2020-06-23 DIAGNOSIS — G4733 Obstructive sleep apnea (adult) (pediatric): Secondary | ICD-10-CM

## 2020-06-23 NOTE — Patient Instructions (Addendum)
Continue CPAP as prescribed. 

## 2020-06-23 NOTE — Progress Notes (Signed)
I connected with the patient by telephone enabled telemedicine visit and verified that I am speaking with the correct person using two identifiers.    I discussed the limitations, risks, security and privacy concerns of performing an evaluation and management service by telemedicine and the availability of in-person appointments. I also discussed with the patient that there may be a patient responsible charge related to this service. The patient expressed understanding and agreed to proceed.  PATIENT AGREES AND CONFIRMS -YES   Other persons participating in the visit and their role in the encounter: Patient, nursing  This visit type was conducted due to national recommendations for restrictions regarding the COVID-19 Pandemic (e.g. social distancing).  This format is felt to be most appropriate for this patient at this time.  All issues noted in this document were discussed and addressed.        Name: Kerri Mills MRN: [id number] DOB: 04/24/1931     CONSULTATION DATE: 06/23/2020 REFERRING MD : Nicki Reaper   CHIEF COMPLAINT:  Follow up cough Follow up OSA    HISTORY OF PRESENT ILLNESS: Patient has a chronic productive cough diagnosed with chronic bronchitis and chronic bronchiectasis This has been going on for many many many years Patient does have a prescribed flutter valve which he uses 10-15 times per day and it has helped with her symptoms  Patient does have a history of infections and pneumonias in the past CT of the chest reviewed from December 2020 which shows subcentimeter lung nodules and bilateral bronchiectasis that are not significant at this time however will need repeat CT chest in 1 year Suggestive of mucoid impaction At this time there is no signs of infection No signs of inflammation   No evidence of exacerbation at this time   no indication for antibiotics or steroids at this time   She is a non-smoker She does have history of secondhand smoke  exposure Family history of emphysema March 2020 CT chest shows subcentimeter nodular opacities bilateral bronchiectasis   No  exacerbation at this time No evidence of heart failure at this time No evidence or signs of infection at this time No respiratory distress No fevers, chills, nausea, vomiting, diarrhea No evidence of lower extremity edema No evidence hemoptysis  Patient has a previous history of sleep apnea Does have excessive daytime sleepiness and fatigue However patient has not had any opportunity to obtain home sleep study       PAST MEDICAL HISTORY :   has a past medical history of Allergy, Anxiety, Arthritis, Atypical chest pain, Clotting disorder ([place]), Colitis, Depression, Diverticulitis (2013), Gastric ulcer, GERD (gastroesophageal reflux disease), History of echocardiogram, Hypercholesterolemia, Hypertension, Infiltrating lobular carcinoma of left breast (2011), Melanoma ([place]) (1997), Melanoma in situ of upper extremity ([place]) (03/19/2011), Persistent atrial fibrillation ([place]), Personal history of radiation therapy (2011), Seroma, Sleep apnea, and Thyroid cancer ([place]) (1992).  has a past surgical history that includes Tonsillectomy; Cholecystectomy; Melanoma excision; Thyroid surgery (1992); Partial hysterectomy; Colonoscopy (2013); Abdominal hysterectomy (1973); Breast lumpectomy (Left, 2011); Cardiac catheterization; Breast biopsy (Left, 02-13-13); Breast excisional biopsy (Left, 1995); Breast excisional biopsy (Left, 2011); Breast biopsy (Left, 01/21/2015); and PACEMAKER IMPLANT (N/A, 01/10/2020). Prior to Admission medications   Medication Sig Start Date End Date Taking? Authorizing Provider  albuterol (PROAIR HFA) 108 (90 Base) MCG/ACT inhaler Inhale 2 puffs into the lungs every 6 (six) hours as needed for wheezing or shortness of breath. 08/10/18   Einar Pheasant, MD  Cholecalciferol (VITAMIN D) 1000 UNITS capsule  Take 1,000 Units by mouth daily.      [provider]  DULoxetine (CYMBALTA) 60 MG capsule TAKE 1 CAPSULE (60 MG TOTAL) BY MOUTH AT BEDTIME. 03/12/19   Einar Pheasant, MD  ELIQUIS 5 MG TABS tablet TAKE 1 TABLET BY MOUTH TWICE A DAY 03/11/19   Minna Merritts, MD  ferrous sulfate 325 (65 FE) MG tablet Take 325 mg by mouth every morning.    [provider]  furosemide (LASIX) 20 MG tablet Take 1 tablet (20 mg total) by mouth as directed. Take 1 tablet (20 mg) twice a week with potassium 10/03/18 01/01/19  Minna Merritts, MD  gabapentin (NEURONTIN) 300 MG capsule TAKE 2 CAPSULES (600 MG TOTAL) BY MOUTH AT BEDTIME. 03/06/19   Einar Pheasant, MD  levothyroxine (SYNTHROID) 88 MCG tablet TAKE ONE TABLET BY MOUTH ONCE DAILY BEFORE BREAKFAST 03/12/19   Einar Pheasant, MD  lisinopril (ZESTRIL) 20 MG tablet TAKE 1 TABLET BY MOUTH EVERY DAY 02/01/19   Einar Pheasant, MD  lovastatin (MEVACOR) 40 MG tablet TAKE 1 TABLET BY MOUTH EVERY DAY 11/23/18   Einar Pheasant, MD  Multiple Vitamins-Minerals (PRESERVISION AREDS 2) CAPS Take 1 tablet by mouth 2 (two) times daily.     [provider]  omeprazole (PRILOSEC) 20 MG capsule TAKE 1 CAPSULE (20 MG TOTAL) BY MOUTH DAILY. 11/23/18   Einar Pheasant, MD  potassium chloride (K-DUR) 10 MEQ tablet Take 2 tablets (20 mEq total) by mouth as directed. Take 2 tablets (20 mEq) twice a week with Lasix 10/03/18   Gollan, Kathlene November, MD  propranolol (INDERAL) 10 MG tablet Take 1 tablet (10 mg total) by mouth 3 (three) times daily as needed. 03/05/19   Minna Merritts, MD  triamcinolone cream (KENALOG) 0.1 % Apply 1 application topically 2 (two) times daily. 08/10/18   Einar Pheasant, MD  vitamin B-12 (CYANOCOBALAMIN) 1000 MCG tablet Take 1,000 mcg by mouth daily.    [provider]   Allergies  Allergen Reactions  . Atorvastatin Other (See Comments)    Other reaction(s): Other (See Comments) STIFFNESS AND SORE STIFFNESS AND SORE  . Lipitor [Atorvastatin Calcium] Other (See Comments)     Stiffness & soreness  . Penicillins Rash    REACTION: Unknown reaction    FAMILY HISTORY:  family history includes Cancer in her brother and sister; Heart disease in her mother. SOCIAL HISTORY:  reports that she has never smoked. She has never used smokeless tobacco. She reports current alcohol use. She reports that she does not use drugs.   Review of Systems:  Gen:  Denies  fever, sweats, chills weight loss  HEENT: Denies blurred vision, double vision, ear pain, eye pain, hearing loss, nose bleeds, sore throat Cardiac:  No dizziness, chest pain or heaviness, chest tightness,edema, No JVD Resp:   No cough, -sputum production, -shortness of breath,-wheezing, -hemoptysis,  Gi: Denies swallowing difficulty, stomach pain, nausea or vomiting, diarrhea, constipation, bowel incontinence Gu:  Denies bladder incontinence, burning urine Ext:   Denies Joint pain, stiffness or swelling Skin: Denies  skin rash, easy bruising or bleeding or hives Endoc:  Denies polyuria, polydipsia , polyphagia or weight change Psych:   Denies depression, insomnia or hallucinations  Other:  All other systems negative    BP 138/88 (BP Location: Right Arm, Patient Position: Sitting, Cuff Size: Normal)   Pulse 62   Temp (!) 96.8 F (36 C) (Temporal)   Ht 5\' 5"  (1.651 m)   Wt 187 lb 9.6 oz (85.1  kg)   SpO2 96%   BMI 31.22 kg/m   Physical Examination:   General Appearance: No distress  Neuro:without focal findings,  speech normal,  HEENT: PERRLA, EOM intact.   Pulmonary: normal breath sounds, No wheezing.  CardiovascularNormal S1,S2.  No m/r/g.   Abdomen: Benign, Soft, non-tender. Renal:  No costovertebral tenderness  GU:  Not performed at this time. Endoc: No evident thyromegaly Skin:   warm, no rashes, no ecchymosis  Extremities: normal, no cyanosis, clubbing. PSYCHIATRIC: Mood, affect within normal limits.   ALL OTHER ROS ARE NEGATIVE        IMAGING        The CXR was Independently  Reviewed By Me Today CXR reviewed-B/L bronchiectasis     ASSESSMENT AND PLAN SYNOPSIS   Chronic productive cough consistent with chronic bronchitis in the setting of bilateral bronchiectasis based on CT chest findings with previous history of pneumonias and bronchitis No signs of infection at this time No indication for antibiotics or prednisone at this time Patient is to continue flutter valve 10-15 times per day-has helped her symptoms significantly I also recommend full daily exercise as tolerated Albuterol as needed CONSIDER STOPPING ACE INH BUT SHE WILL NEED TO ADDRESS THIS WITH DR Rockey Situ   Patient has a previous diagnosis of sleep apnea approximately 10 years ago Patient states she has problems breathing at nighttime and has snoring Sleep study was ordered several months ago however patient has not picked it up from [facility] Patient states she has no opportunity or time to do that at this time We will defer assessment for sleep apnea at this time based on patient request  CT of the chest shows multi subcentimeter nodules along with bronchiectasis Recommend repeat CT chest in 1 year for further evaluation   COVID-19 EDUCATION: The signs and symptoms of COVID-19 were discussed with the patient and how to seek care for testing.  The importance of social distancing was discussed today. Hand Washing Techniques and avoid touching face was advised.     MEDICATION ADJUSTMENTS/LABS AND TESTS ORDERED: Continue flutter valve 10-15 times per day  Albuterol as needed  We will evaluate for sleep apnea at later point based on patient request CONSIDER STOPPING ACE INH for [place]    Patient satisfied with Plan of action and management. All questions answered  Follow up in 6 months  TOTAL TIME SPENT 32 mins   Maretta Bees Patricia Pesa, M.D.  [facility]  Medical Director [place] Director [facility] Cardio-Pulmonary Department

## 2020-07-07 ENCOUNTER — Ambulatory Visit (INDEPENDENT_AMBULATORY_CARE_PROVIDER_SITE_OTHER): Payer: Medicare Other | Admitting: Internal Medicine

## 2020-07-07 ENCOUNTER — Encounter: Payer: Self-pay | Admitting: Internal Medicine

## 2020-07-07 ENCOUNTER — Other Ambulatory Visit: Payer: Self-pay

## 2020-07-07 VITALS — BP 126/78 | HR 68 | Temp 98.0°F | Resp 16 | Ht 64.0 in | Wt 180.8 lb

## 2020-07-07 DIAGNOSIS — F439 Reaction to severe stress, unspecified: Secondary | ICD-10-CM | POA: Diagnosis not present

## 2020-07-07 DIAGNOSIS — E039 Hypothyroidism, unspecified: Secondary | ICD-10-CM

## 2020-07-07 DIAGNOSIS — Z23 Encounter for immunization: Secondary | ICD-10-CM | POA: Diagnosis not present

## 2020-07-07 DIAGNOSIS — E559 Vitamin D deficiency, unspecified: Secondary | ICD-10-CM

## 2020-07-07 DIAGNOSIS — G4733 Obstructive sleep apnea (adult) (pediatric): Secondary | ICD-10-CM

## 2020-07-07 DIAGNOSIS — K219 Gastro-esophageal reflux disease without esophagitis: Secondary | ICD-10-CM

## 2020-07-07 DIAGNOSIS — E7800 Pure hypercholesterolemia, unspecified: Secondary | ICD-10-CM

## 2020-07-07 DIAGNOSIS — Z85850 Personal history of malignant neoplasm of thyroid: Secondary | ICD-10-CM

## 2020-07-07 DIAGNOSIS — J411 Mucopurulent chronic bronchitis: Secondary | ICD-10-CM

## 2020-07-07 DIAGNOSIS — R739 Hyperglycemia, unspecified: Secondary | ICD-10-CM

## 2020-07-07 DIAGNOSIS — R0602 Shortness of breath: Secondary | ICD-10-CM

## 2020-07-07 DIAGNOSIS — I10 Essential (primary) hypertension: Secondary | ICD-10-CM

## 2020-07-07 DIAGNOSIS — L989 Disorder of the skin and subcutaneous tissue, unspecified: Secondary | ICD-10-CM

## 2020-07-07 DIAGNOSIS — I4891 Unspecified atrial fibrillation: Secondary | ICD-10-CM

## 2020-07-07 LAB — TSH: TSH: 1.24 u[IU]/mL (ref 0.35–4.50)

## 2020-07-07 MED ORDER — MUPIROCIN 2 % EX OINT: TOPICAL_OINTMENT | CUTANEOUS | 0 refills | Status: DC

## 2020-07-07 NOTE — Progress Notes (Signed)
Patient ID: Kerri Mills, female   DOB: [birth year], 86 y.o.   MRN: [id number]   Subjective:    Patient ID: Kerri Mills, female    DOB: 05/11/1931, 86 y.o.   MRN: [id number]  HPI This visit occurred during the SARS-CoV-2 public health emergency.  Safety protocols were in place, including screening questions prior to the visit, additional usage of staff PPE, and extensive cleaning of exam room while observing appropriate contact time as indicated for disinfecting solutions.  Patient here for a scheduled follow up.  She reports she is doing relatively well. Just saw pulmonary 06/23/20.  Recommended continuing flutter valve 10-15 times per day.  Albuterol as needed.  Breathing - she feels is stable. Recommended chest CT in one year.  No chest pain.  She is still walking.  Eating.  No nausea or vomiting.  Bowels moving.  Back lesions.   Past Medical History:  Diagnosis Date   Allergy    Anxiety    Arthritis    Atypical chest pain    a. 10/2018 MV: small, fixed apical defect possibly 2/2 attenuation artifact. No ischemia.  EF 68%.   Clotting disorder ([place])    Colitis    Depression    Diverticulitis 2013   Gastric ulcer    GERD (gastroesophageal reflux disease)    History of echocardiogram    a. 09/2019 Echo: EF 55-60%, mod LVH. Mildly dil LA. Triv MR/TR.   Hypercholesterolemia    Hypertension    Infiltrating lobular carcinoma of left breast 2011   T2,N0, ER: 90%; PR 0%; Her 2 neu not amplified. [facility]).   Melanoma ([place]) 1997   Melanoma in situ of upper extremity ([place]) 03/19/2011   Persistent atrial fibrillation ([place])    a. CHADS2VASc => 4 (HTN, age x 2, female)   Personal history of radiation therapy 2011   BREAST CA   Seroma    HISTORY OF LFT BREAST   Sleep apnea    Thyroid cancer ([place]) 1992   Past Surgical History:  Procedure Laterality Date   ABDOMINAL HYSTERECTOMY  1973   partial   BREAST BIOPSY Left 02-13-13   BENIGN BREAST TISSUE WITH  CHANGES CONSISTENT WITH FAT NECROSIS   BREAST BIOPSY Left 01/21/2015   bx done in [facility] office 11:00 left 6-8cmfn   BREAST EXCISIONAL BIOPSY Left 1995   neg   BREAST EXCISIONAL BIOPSY Left 2011   Breast cancer radiation   BREAST LUMPECTOMY Left 2011   BREAST CA   CARDIAC CATHETERIZATION     CHOLECYSTECTOMY     COLONOSCOPY  2013   MELANOMA EXCISION     RT UPPER ARM   PACEMAKER IMPLANT N/A 01/10/2020   Procedure: PACEMAKER IMPLANT;  Surgeon: Constance Haw, MD;  Location: [facility];  Service: Cardiovascular;  Laterality: N/A;   PARTIAL HYSTERECTOMY     bleeding, ovaries in place.     THYROID SURGERY  1992   FOR THYROID CANCER   TONSILLECTOMY     Family History  Problem Relation Age of Onset   Heart disease Mother    Cancer Brother        lung    Cancer Sister        breast   Breast cancer Neg Hx    Social History   Socioeconomic History   Marital status: Widowed    Spouse name: Not on file   Number of children: Not on file   Years of education: Not on file  Highest education level: Not on file  Occupational History   Not on file  Tobacco Use   Smoking status: Never Smoker   Smokeless tobacco: Never Used  Substance and Sexual Activity   Alcohol use: Yes    Alcohol/week: 0.0 standard drinks    Comment: social drinking. average times a week   Drug use: No   Sexual activity: Never  Other Topics Concern   Not on file  Social History Narrative   Independent and baseline. Lives by herself   Social Determinants of Health   Financial Resource Strain:    Difficulty of Paying Living Expenses: Not on file  Food Insecurity:    Worried About Charity fundraiser in the Last Year: Not on file   [employer] of Food in the Last Year: Not on file  Transportation Needs:    Lack of Transportation (Medical): Not on file   Lack of Transportation (Non-Medical): Not on file  Physical Activity:    Days of Exercise per Week: Not on  file   Minutes of Exercise per Session: Not on file  Stress:    Feeling of Stress : Not on file  Social Connections:    Frequency of Communication with Friends and Family: Not on file   Frequency of Social Gatherings with Friends and Family: Not on file   Attends Religious Services: Not on file   Active Member of Clubs or Organizations: Not on file   Attends Club or Organization Meetings: Not on file   Marital Status: Not on file    Outpatient Encounter Medications as of 07/07/2020  Medication Sig   albuterol (PROAIR HFA) 108 (90 Base) MCG/ACT inhaler Inhale 2 puffs into the lungs every 6 (six) hours as needed for wheezing or shortness of breath.   Cholecalciferol (VITAMIN D) 1000 UNITS capsule Take 1,000 Units by mouth daily.     DULoxetine (CYMBALTA) 60 MG capsule TAKE 1 CAPSULE (60 MG TOTAL) BY MOUTH AT BEDTIME.   ELIQUIS 5 MG TABS tablet TAKE 1 TABLET BY MOUTH TWICE A DAY   furosemide (LASIX) 20 MG tablet TAKE 1 TABLET (20 MG TOTAL) BY MOUTH AS DIRECTED. TAKE 1 TABLET (20 MG) TWICE A WEEK WITH POTASSIUM (Patient taking differently: Take 20 mg by mouth See admin instructions. Take 1 tablet (20 mg) twice a week on Tuesdays and Saturdays  with potassium)   gabapentin (NEURONTIN) 300 MG capsule TAKE 2 CAPSULES (600 MG TOTAL) BY MOUTH AT BEDTIME.   levothyroxine (SYNTHROID) 100 MCG tablet Take 1 tablet (100 mcg total) by mouth daily.   lovastatin (MEVACOR) 40 MG tablet TAKE 1 TABLET BY MOUTH EVERY DAY   metoprolol tartrate (LOPRESSOR) 25 MG tablet TAKE 1 TABLET BY MOUTH TWICE A DAY   Multiple Vitamins-Minerals (PRESERVISION AREDS 2) CAPS Take 1 tablet by mouth 2 (two) times daily.    mupirocin ointment (BACTROBAN) 2 % Apply to affected area bid   nystatin cream (MYCOSTATIN) Apply 1 application topically 2 (two) times daily. (Patient not taking: Reported on 06/23/2020)   omeprazole (PRILOSEC) 20 MG capsule TAKE 1 CAPSULE BY MOUTH EVERY DAY (Patient taking differently:  Take 20 mg by mouth daily. )   Polyethyl Glycol-Propyl Glycol (SYSTANE OP) Place 1 drop into both eyes daily as needed (for dry eyes).   potassium chloride (KLOR-CON) 10 MEQ tablet TAKE 2 TABLETS (20 MEQ TOTAL) BY MOUTH AS DIRECTED. TAKE 2 TABLETS (20 MEQ) TWICE A WEEK WITH LASIX   White Petrolatum-Mineral Oil (GENTEAL TEARS NIGHT-TIME OP) Place  1 application into both eyes at bedtime. Night time ointment 3.5g    No facility-administered encounter medications on file as of 07/07/2020.    Review of Systems  Constitutional: Negative for appetite change and unexpected weight change.  HENT: Negative for congestion and sinus pressure.   Respiratory: Negative for chest tightness.        Breathing stable.   Cardiovascular: Negative for chest pain, palpitations and leg swelling.  Gastrointestinal: Negative for abdominal pain, diarrhea, nausea and vomiting.  Genitourinary: Negative for difficulty urinating and dysuria.  Skin: Negative for color change and rash.  Neurological: Negative for dizziness, light-headedness and headaches.  Psychiatric/Behavioral: Negative for agitation and dysphoric mood.       Objective:    Physical Exam Vitals reviewed.  Constitutional:      General: She is not in acute distress.    Appearance: Normal appearance.  HENT:     Head: Normocephalic and atraumatic.     Right Ear: External ear normal.     Left Ear: External ear normal.  Eyes:     General: No scleral icterus.       Right eye: No discharge.        Left eye: No discharge.     Conjunctiva/sclera: Conjunctivae normal.  Neck:     Thyroid: No thyromegaly.  Cardiovascular:     Rate and Rhythm: Normal rate and regular rhythm.  Pulmonary:     Effort: No respiratory distress.     Breath sounds: Normal breath sounds. No wheezing.  Abdominal:     General: Bowel sounds are normal.     Palpations: Abdomen is soft.     Tenderness: There is no abdominal tenderness.  Musculoskeletal:        General: No  swelling or tenderness.     Cervical back: Neck supple. No tenderness.  Lymphadenopathy:     Cervical: No cervical adenopathy.  Skin:    Findings: No erythema or rash.  Neurological:     Mental Status: She is alert.  Psychiatric:        Mood and Affect: Mood normal.        Behavior: Behavior normal.     BP 126/78    Pulse 68    Temp 98 F (36.7 C) (Oral)    Resp 16    Ht $R'5\' 4"'Ho$  (1.626 m)    Wt 180 lb 12.8 oz (82 kg)    SpO2 97%    BMI 31.03 kg/m  Wt Readings from Last 3 Encounters:  07/07/20 180 lb 12.8 oz (82 kg)  06/23/20 187 lb 9.6 oz (85.1 kg)  04/23/20 182 lb 6.4 oz (82.7 kg)     Lab Results  Component Value Date   WBC 6.7 01/10/2020   HGB 13.0 01/10/2020   HCT 40.6 01/10/2020   PLT 210 01/10/2020   GLUCOSE 107 (H) 06/02/2020   CHOL 154 06/02/2020   TRIG 146.0 06/02/2020   HDL 38.40 (L) 06/02/2020   LDLDIRECT 75.0 03/24/2017   LDLCALC 86 06/02/2020   ALT 10 06/02/2020   ALT 10 06/02/2020   AST 14 06/02/2020   AST 14 06/02/2020   NA 142 06/02/2020   K 4.9 06/02/2020   CL 103 06/02/2020   CREATININE 0.92 06/02/2020   BUN 15 06/02/2020   CO2 32 06/02/2020   TSH 1.24 07/07/2020   INR 1.5 (H) 11/21/2018   HGBA1C 6.3 06/02/2020    DG Chest 2 View  Result Date: 01/11/2020 CLINICAL DATA:  Pacemaker placement EXAM: CHEST -  2 VIEW COMPARISON:  None. FINDINGS: Left chest wall pacemaker with leads in expected locations. The heart size and mediastinal contours are within normal limits. Both lungs are clear. No pneumothorax. The visualized skeletal structures are unremarkable. IMPRESSION: Left chest pacemaker placement. No pneumothorax. Electronically Signed   By: Ulyses Jarred M.D.   On: 01/11/2020 06:44   EP PPM/ICD IMPLANT  Result Date: 01/10/2020 SURGEON:  Allegra Lai, MD   PREPROCEDURE DIAGNOSIS:  Sick sinus syndrome   POSTPROCEDURE DIAGNOSIS:  Sick sinus syndrome    PROCEDURES:  1. Pacemaker implantation.   INTRODUCTION:  MAGNOLIA MATTILA is a 86 y.o. female with a  history of bradycardia who presents today for pacemaker implantation.  The patient reports intermittent episodes of dizziness and syncope over the past few months.  No reversible causes have been identified.  The patient therefore presents today for pacemaker implantation.   DESCRIPTION OF PROCEDURE:  Informed written consent was obtained, and  the patient was brought to the electrophysiology lab in a fasting state.  The patient required no sedation for the procedure today.  The patients left chest was prepped and draped in the usual sterile fashion by the EP lab staff. The skin overlying the left deltopectoral region was infiltrated with lidocaine for local analgesia.  A 4-cm incision was made over the left deltopectoral region.  A left subcutaneous pacemaker pocket was fashioned using a combination of sharp and blunt dissection. Electrocautery was required to assure hemostasis.  RA/RV Lead Placement: The left axillary vein was therefore cannulated.  Through the left axillary vein, a [employer] model Tendril MRI [id number] (serial number  [id number]) right atrial lead and a [employer] model Tendril MRI [id number] (serial number  [id number]) right ventricular lead were advanced with fluoroscopic visualization into the right atrial appendage and right ventricular apex positions respectively.  Initial atrial lead P- waves measured 3.7 mV with impedance of 591 ohms and a threshold of 0.8 V at 0.5 msec.  Right ventricular lead R-waves measured 15.1 mV with an impedance of 877 ohms and a threshold of 0.5 V at 0.5 msec.  Both leads were secured to the pectoralis fascia using #2-0 silk over the suture sleeves. Device Placement:  The leads were then connected to a [place] MRI  model [id number] (serial number  [id number] ) pacemaker.  The pocket was irrigated with copious gentamicin solution.  The pacemaker was then placed into the pocket.  The pocket was then closed in 3 layers with 2.0 Vicryl suture for the 3.0  Vicryl suture subcutaneous and subcuticular layers.  Steri-  Strips and a sterile dressing were then applied. EBL<11m.  There were no early apparent complications.   CONCLUSIONS:  1. Successful implantation of a [employer] Assurity MRI dual-chamber pacemaker for symptomatic bradycardia  2. No early apparent complications.       Will CCurt Bears MD 01/10/2020 4:51 PM       Assessment & Plan:   Problem List Items Addressed This Visit    Vitamin D deficiency    Check vitamin D level with next labs.        Relevant Orders   VITAMIN D 25 Hydroxy (Vit-D Deficiency, Fractures)   Stress    Overall doing well.  On cymbalta.  Follow.        SOB (shortness of breath)    Seeing pulmonary.  Just evaluated 05/2020.  Breathing stable.  Continue flutter valve and current inhaler regimen.  Follow.  Recommended f/u chest  CT in one year.       Obstructive sleep apnea    Seeing pulmonary.       Mucopurulent chronic bronchitis ([place])    Followed by pulmonary.  Overall feels breathing is stable.  Continue flutter valve as directed.  Follow.       Relevant Medications   mupirocin ointment (BACTROBAN) 2 %   Hypothyroid    On thyroid replacement.  Follow tsh       Relevant Orders   TSH (Completed)   Hyperglycemia    Low carb diet and exercise.  Follow met b and a1c.        Relevant Orders   Hemoglobin A1c   Hypercholesterolemia    On lovastatin.  Low cholesterol diet and exercise.  Follow lipid panel and liver function tests.        Relevant Orders   Hepatic function panel   Lipid panel   History of thyroid cancer    S/p XRT. On thyroid replacement.  Follow tsh.       Relevant Orders   TSH   GERD (gastroesophageal reflux disease)    Controlled.  On omeprazole.       Essential hypertension    Blood pressure doing well.  Takes lasix prn.  Follow.       Relevant Orders   Basic metabolic panel   Back skin lesion    Skin lesions.  Bactroban.  Notify me if persistent.        Atrial fibrillation [facility])    S/p pacemaker placement.  On eliquis and metoprolol.  Doing well.  Follow.        Other Visit Diagnoses    Need for immunization against influenza    -  Primary   Relevant Orders   Flu Vaccine QUAD High Dose(Fluad) (Completed)       Einar Pheasant, MD

## 2020-07-10 DIAGNOSIS — R0602 Shortness of breath: Secondary | ICD-10-CM | POA: Diagnosis not present

## 2020-07-10 DIAGNOSIS — G4733 Obstructive sleep apnea (adult) (pediatric): Secondary | ICD-10-CM | POA: Diagnosis not present

## 2020-07-10 DIAGNOSIS — J479 Bronchiectasis, uncomplicated: Secondary | ICD-10-CM | POA: Diagnosis not present

## 2020-07-10 DIAGNOSIS — J410 Simple chronic bronchitis: Secondary | ICD-10-CM | POA: Diagnosis not present

## 2020-07-12 ENCOUNTER — Encounter: Payer: Self-pay | Admitting: Internal Medicine

## 2020-07-12 DIAGNOSIS — L989 Disorder of the skin and subcutaneous tissue, unspecified: Secondary | ICD-10-CM | POA: Insufficient documentation

## 2020-07-12 DIAGNOSIS — E559 Vitamin D deficiency, unspecified: Secondary | ICD-10-CM | POA: Insufficient documentation

## 2020-07-12 NOTE — Assessment & Plan Note (Signed)
Skin lesions.  Bactroban.  Notify me if persistent.

## 2020-07-12 NOTE — Assessment & Plan Note (Signed)
Seeing pulmonary

## 2020-07-12 NOTE — Assessment & Plan Note (Signed)
On lovastatin.  Low cholesterol diet and exercise.  Follow lipid panel and liver function tests.   

## 2020-07-12 NOTE — Assessment & Plan Note (Signed)
Seeing pulmonary.  Just evaluated 05/2020.  Breathing stable.  Continue flutter valve and current inhaler regimen.  Follow.  Recommended f/u chest CT in one year.

## 2020-07-12 NOTE — Assessment & Plan Note (Signed)
On thyroid replacement.  Follow tsh.  

## 2020-07-12 NOTE — Assessment & Plan Note (Signed)
S/p XRT.  On thyroid replacement.  Follow tsh.  

## 2020-07-12 NOTE — Assessment & Plan Note (Signed)
Check vitamin D level with next labs.  ?

## 2020-07-12 NOTE — Assessment & Plan Note (Signed)
Low carb diet and exercise.  Follow met b and a1c.   

## 2020-07-12 NOTE — Assessment & Plan Note (Signed)
Followed by pulmonary.  Overall feels breathing is stable.  Continue flutter valve as directed.  Follow.

## 2020-07-12 NOTE — Assessment & Plan Note (Signed)
S/p pacemaker placement.  On eliquis and metoprolol.  Doing well.  Follow.

## 2020-07-12 NOTE — Assessment & Plan Note (Signed)
Controlled.  On omeprazole.   

## 2020-07-12 NOTE — Assessment & Plan Note (Signed)
Blood pressure doing well.  Takes lasix prn.  Follow.

## 2020-07-12 NOTE — Assessment & Plan Note (Signed)
Overall doing well.  On cymbalta.  Follow.

## 2020-07-17 DIAGNOSIS — G4733 Obstructive sleep apnea (adult) (pediatric): Secondary | ICD-10-CM | POA: Diagnosis not present

## 2020-07-17 DIAGNOSIS — J479 Bronchiectasis, uncomplicated: Secondary | ICD-10-CM | POA: Diagnosis not present

## 2020-07-17 DIAGNOSIS — J410 Simple chronic bronchitis: Secondary | ICD-10-CM | POA: Diagnosis not present

## 2020-07-20 DIAGNOSIS — H353221 Exudative age-related macular degeneration, left eye, with active choroidal neovascularization: Secondary | ICD-10-CM | POA: Diagnosis not present

## 2020-07-21 ENCOUNTER — Ambulatory Visit (INDEPENDENT_AMBULATORY_CARE_PROVIDER_SITE_OTHER): Payer: Medicare Other

## 2020-07-21 DIAGNOSIS — I495 Sick sinus syndrome: Secondary | ICD-10-CM | POA: Diagnosis not present

## 2020-07-21 LAB — CUP PACEART REMOTE DEVICE CHECK
Battery Remaining Longevity: 108 mo
Battery Remaining Percentage: 95.5 %
Battery Voltage: 3.01 V
Brady Statistic AP VP Percent: 2.8 %
Brady Statistic AP VS Percent: 80 %
Brady Statistic AS VP Percent: 1 %
Brady Statistic AS VS Percent: 13 %
Brady Statistic RA Percent Paced: 75 %
Brady Statistic RV Percent Paced: 3.1 %
Date Time Interrogation Session: 20211025031204
Implantable Lead Implant Date: 20210416
Implantable Lead Implant Date: 20210416
Implantable Lead Location: 753859
Implantable Lead Location: 753860
Implantable Pulse Generator Implant Date: 20210416
Lead Channel Impedance Value: 380 Ohm
Lead Channel Impedance Value: 560 Ohm
Lead Channel Pacing Threshold Amplitude: 0.75 V
Lead Channel Pacing Threshold Amplitude: 0.75 V
Lead Channel Pacing Threshold Pulse Width: 0.5 ms
Lead Channel Pacing Threshold Pulse Width: 0.5 ms
Lead Channel Sensing Intrinsic Amplitude: 1.6 mV
Lead Channel Sensing Intrinsic Amplitude: 11.9 mV
Lead Channel Setting Pacing Amplitude: 2 V
Lead Channel Setting Pacing Amplitude: 2.5 V
Lead Channel Setting Pacing Pulse Width: 0.5 ms
Lead Channel Setting Sensing Sensitivity: 2 mV
Pulse Gen Model: 2272
Pulse Gen Serial Number: 3813093

## 2020-07-24 NOTE — Progress Notes (Signed)
Remote pacemaker transmission.   

## 2020-07-27 ENCOUNTER — Encounter (INDEPENDENT_AMBULATORY_CARE_PROVIDER_SITE_OTHER): Payer: Self-pay

## 2020-07-27 ENCOUNTER — Ambulatory Visit (INDEPENDENT_AMBULATORY_CARE_PROVIDER_SITE_OTHER): Payer: Medicare Other

## 2020-07-27 VITALS — Ht 64.0 in | Wt 180.0 lb

## 2020-07-27 DIAGNOSIS — Z0000 Encounter for general adult medical examination without abnormal findings: Secondary | ICD-10-CM

## 2020-07-27 NOTE — Patient Instructions (Addendum)
Kerri Mills , Thank you for taking time to come for your Medicare Wellness Visit. I appreciate your ongoing commitment to your health goals. Please review the following plan we discussed and let me know if I can assist you in the future.   These are the goals we discussed: Goals    . Follow up with Primary Care Provider     As needed       This is a list of the screening recommended for you and due dates:  Health Maintenance  Topic Date Due  . Mammogram  12/18/2020  . Tetanus Vaccine  11/15/2026  . Flu Shot  Completed  . DEXA scan (bone density measurement)  Completed  . COVID-19 Vaccine  Completed  . Pneumonia vaccines  Completed    Immunizations Immunization History  Administered Date(s) Administered  . Fluad Quad(high Dose 65+) 06/06/2019, 07/07/2020  . Influenza Split 07/18/2014  . Influenza, High Dose Seasonal PF 07/01/2015, 06/13/2016, 05/23/2017, 06/25/2018  . Influenza,inj,Quad PF,6+ Mos 06/11/2013  . Moderna SARS-COVID-2 Vaccination 10/08/2019, 11/05/2019, 07/24/2020  . Pneumococcal Conjugate-13 12/18/2013  . Pneumococcal Polysaccharide-23 07/01/2015  . Tdap 11/15/2016  . Zoster Recombinat (Shingrix) 06/25/2018, 07/13/2018, 11/07/2018   Keep all routine maintenance appointments.   Next scheduled lab 10/07/20 @ 10:00  Follow up 10/09/20 @ 10:00  Advanced directives: completed, on file  Risks identified: none new.  Next appointment: Follow up in one year for your annual wellness visit.   Preventive Care 13 Years and Older, Female Preventive care refers to lifestyle choices and visits with your health care provider that can promote health and wellness. What does preventive care include?  A yearly physical exam. This is also called an annual well check.  Dental exams once or twice a year.  Routine eye exams. Ask your health care provider how often you should have your eyes checked.  Personal lifestyle choices, including:  Daily care of your teeth and  gums.  Regular physical activity.  Eating a healthy diet.  Avoiding tobacco and drug use.  Limiting alcohol use.  Practicing safe sex.  Taking low-dose aspirin every day.  Taking vitamin and mineral supplements as recommended by your health care provider. What happens during an annual well check? The services and screenings done by your health care provider during your annual well check will depend on your age, overall health, lifestyle risk factors, and family history of disease. Counseling  Your health care provider may ask you questions about your:  Alcohol use.  Tobacco use.  Drug use.  Emotional well-being.  Home and relationship well-being.  Sexual activity.  Eating habits.  History of falls.  Memory and ability to understand (cognition).  Work and work Statistician.  Reproductive health. Screening  You may have the following tests or measurements:  Height, weight, and BMI.  Blood pressure.  Lipid and cholesterol levels. These may be checked every 5 years, or more frequently if you are over 18 years old.  Skin check.  Lung cancer screening. You may have this screening every year starting at age 88 if you have a 30-pack-year history of smoking and currently smoke or have quit within the past 15 years.  Fecal occult blood test (FOBT) of the stool. You may have this test every year starting at age 75.  Flexible sigmoidoscopy or colonoscopy. You may have a sigmoidoscopy every 5 years or a colonoscopy every 10 years starting at age 5.  Hepatitis C blood test.  Hepatitis B blood test.  Sexually transmitted disease (STD)  testing.  Diabetes screening. This is done by checking your blood sugar (glucose) after you have not eaten for a while (fasting). You may have this done every 1-3 years.  Bone density scan. This is done to screen for osteoporosis. You may have this done starting at age 15.  Mammogram. This may be done every 1-2 years. Talk to your  health care provider about how often you should have regular mammograms. Talk with your health care provider about your test results, treatment options, and if necessary, the need for more tests. Vaccines  Your health care provider may recommend certain vaccines, such as:  Influenza vaccine. This is recommended every year.  Tetanus, diphtheria, and acellular pertussis (Tdap, Td) vaccine. You may need a Td booster every 10 years.  Zoster vaccine. You may need this after age 59.  Pneumococcal 13-valent conjugate (PCV13) vaccine. One dose is recommended after age 51.  Pneumococcal polysaccharide (PPSV23) vaccine. One dose is recommended after age 88. Talk to your health care provider about which screenings and vaccines you need and how often you need them. This information is not intended to replace advice given to you by your health care provider. Make sure you discuss any questions you have with your health care provider. Document Released: 10/09/2015 Document Revised: 06/01/2016 Document Reviewed: 07/14/2015 Elsevier Interactive Patient Education  2017 [place] Prevention in the Home Falls can cause injuries. They can happen to people of all ages. There are many things you can do to make your home safe and to help prevent falls. What can I do on the outside of my home?  Regularly fix the edges of walkways and driveways and fix any cracks.  Remove anything that might make you trip as you walk through a door, such as a raised step or threshold.  Trim any bushes or trees on the path to your home.  Use bright outdoor lighting.  Clear any walking paths of anything that might make someone trip, such as rocks or tools.  Regularly check to see if handrails are loose or broken. Make sure that both sides of any steps have handrails.  Any raised decks and porches should have guardrails on the edges.  Have any leaves, snow, or ice cleared regularly.  Use sand or salt on walking  paths during winter.  Clean up any spills in your garage right away. This includes oil or grease spills. What can I do in the bathroom?  Use night lights.  Install grab bars by the toilet and in the tub and shower. Do not use towel bars as grab bars.  Use non-skid mats or decals in the tub or shower.  If you need to sit down in the shower, use a plastic, non-slip stool.  Keep the floor dry. Clean up any water that spills on the floor as soon as it happens.  Remove soap buildup in the tub or shower regularly.  Attach bath mats securely with double-sided non-slip rug tape.  Do not have throw rugs and other things on the floor that can make you trip. What can I do in the bedroom?  Use night lights.  Make sure that you have a light by your bed that is easy to reach.  Do not use any sheets or blankets that are too big for your bed. They should not hang down onto the floor.  Have a firm chair that has side arms. You can use this for support while you get dressed.  Do not  have throw rugs and other things on the floor that can make you trip. What can I do in the kitchen?  Clean up any spills right away.  Avoid walking on wet floors.  Keep items that you use a lot in easy-to-reach places.  If you need to reach something above you, use a strong step stool that has a grab bar.  Keep electrical cords out of the way.  Do not use floor polish or wax that makes floors slippery. If you must use wax, use non-skid floor wax.  Do not have throw rugs and other things on the floor that can make you trip. What can I do with my stairs?  Do not leave any items on the stairs.  Make sure that there are handrails on both sides of the stairs and use them. Fix handrails that are broken or loose. Make sure that handrails are as long as the stairways.  Check any carpeting to make sure that it is firmly attached to the stairs. Fix any carpet that is loose or worn.  Avoid having throw rugs at  the top or bottom of the stairs. If you do have throw rugs, attach them to the floor with carpet tape.  Make sure that you have a light switch at the top of the stairs and the bottom of the stairs. If you do not have them, ask someone to add them for you. What else can I do to help prevent falls?  Wear shoes that:  Do not have high heels.  Have rubber bottoms.  Are comfortable and fit you well.  Are closed at the toe. Do not wear sandals.  If you use a stepladder:  Make sure that it is fully opened. Do not climb a closed stepladder.  Make sure that both sides of the stepladder are locked into place.  Ask someone to hold it for you, if possible.  Clearly mark and make sure that you can see:  Any grab bars or handrails.  First and last steps.  Where the edge of each step is.  Use tools that help you move around (mobility aids) if they are needed. These include:  Canes.  Walkers.  Scooters.  Crutches.  Turn on the lights when you go into a dark area. Replace any light bulbs as soon as they burn out.  Set up your furniture so you have a clear path. Avoid moving your furniture around.  If any of your floors are uneven, fix them.  If there are any pets around you, be aware of where they are.  Review your medicines with your doctor. Some medicines can make you feel dizzy. This can increase your chance of falling. Ask your doctor what other things that you can do to help prevent falls. This information is not intended to replace advice given to you by your health care provider. Make sure you discuss any questions you have with your health care provider. Document Released: 07/09/2009 Document Revised: 02/18/2016 Document Reviewed: 10/17/2014 Elsevier Interactive Patient Education  2017 [employer].

## 2020-07-27 NOTE — Progress Notes (Addendum)
Subjective:   Kerri Mills is a 86 y.o. female who presents for Medicare Annual (Subsequent) preventive examination.  Review of Systems    No ROS.  Medicare Wellness Virtual Visit.    Cardiac Risk Factors include: advanced age (>29men, >108 women);hypertension     Objective:    Today's Vitals   07/27/20 0934  Weight: 180 lb (81.6 kg)  Height: 5\' 4"  (1.626 m)   Body mass index is 30.9 kg/m.  Advanced Directives 07/27/2020 01/10/2020 01/08/2020 01/08/2020 10/20/2019 07/25/2019 07/19/2018  Does Patient Have a Medical Advance Directive? Yes Yes Yes No Yes Yes Yes  Type of Paramedic of [place];Living will [place];Living will [place];Living will - Living will [place];Living will [place];Living will  Does patient want to make changes to medical advance directive? No - Patient declined No - Patient declined No - Patient declined - No - Patient declined No - Patient declined No - Patient declined  Copy of [place] in Chart? Yes - validated most recent copy scanned in chart (See row information) - - - - Yes - validated most recent copy scanned in chart (See row information) Yes  Would patient like information on creating a medical advance directive? - No - Patient declined No - Patient declined No - Guardian declined No - Patient declined - -    Current Medications (verified) Outpatient Encounter Medications as of 07/27/2020  Medication Sig  . albuterol (PROAIR HFA) 108 (90 Base) MCG/ACT inhaler Inhale 2 puffs into the lungs every 6 (six) hours as needed for wheezing or shortness of breath.  . Cholecalciferol (VITAMIN D) 1000 UNITS capsule Take 1,000 Units by mouth daily.    . DULoxetine (CYMBALTA) 60 MG capsule TAKE 1 CAPSULE (60 MG TOTAL) BY MOUTH AT BEDTIME.  Marland Kitchen ELIQUIS 5 MG TABS tablet TAKE 1 TABLET BY MOUTH TWICE A DAY  . furosemide (LASIX) 20 MG tablet TAKE 1  TABLET (20 MG TOTAL) BY MOUTH AS DIRECTED. TAKE 1 TABLET (20 MG) TWICE A WEEK WITH POTASSIUM (Patient taking differently: Take 20 mg by mouth See admin instructions. Take 1 tablet (20 mg) twice a week on Tuesdays and Saturdays  with potassium)  . gabapentin (NEURONTIN) 300 MG capsule TAKE 2 CAPSULES (600 MG TOTAL) BY MOUTH AT BEDTIME.  Marland Kitchen levothyroxine (SYNTHROID) 100 MCG tablet Take 1 tablet (100 mcg total) by mouth daily.  Marland Kitchen lovastatin (MEVACOR) 40 MG tablet TAKE 1 TABLET BY MOUTH EVERY DAY  . metoprolol tartrate (LOPRESSOR) 25 MG tablet TAKE 1 TABLET BY MOUTH TWICE A DAY  . Multiple Vitamins-Minerals (PRESERVISION AREDS 2) CAPS Take 1 tablet by mouth 2 (two) times daily.   . mupirocin ointment (BACTROBAN) 2 % Apply to affected area bid  . nystatin cream (MYCOSTATIN) Apply 1 application topically 2 (two) times daily. (Patient not taking: Reported on 06/23/2020)  . omeprazole (PRILOSEC) 20 MG capsule TAKE 1 CAPSULE BY MOUTH EVERY DAY (Patient taking differently: Take 20 mg by mouth daily. )  . Polyethyl Glycol-Propyl Glycol (SYSTANE OP) Place 1 drop into both eyes daily as needed (for dry eyes).  . potassium chloride (KLOR-CON) 10 MEQ tablet TAKE 2 TABLETS (20 MEQ TOTAL) BY MOUTH AS DIRECTED. TAKE 2 TABLETS (20 MEQ) TWICE A WEEK WITH LASIX  . White Petrolatum-Mineral Oil (GENTEAL TEARS NIGHT-TIME OP) Place 1 application into both eyes at bedtime. Night time ointment 3.5g    No facility-administered encounter medications on file  as of 07/27/2020.    Allergies (verified) Atorvastatin, Lipitor [atorvastatin calcium], and Penicillins   History: Past Medical History:  Diagnosis Date  . Allergy   . Anxiety   . Arthritis   . Atypical chest pain    a. 10/2018 MV: small, fixed apical defect possibly 2/2 attenuation artifact. No ischemia.  EF 68%.  . Clotting disorder ([place])   . Colitis   . Depression   . Diverticulitis 2013  . Gastric ulcer   . GERD (gastroesophageal reflux disease)   . History  of echocardiogram    a. 09/2019 Echo: EF 55-60%, mod LVH. Mildly dil LA. Triv MR/TR.  Marland Kitchen Hypercholesterolemia   . Hypertension   . Infiltrating lobular carcinoma of left breast 2011   T2,N0, ER: 90%; PR 0%; Her 2 neu not amplified. [facility]).  . Melanoma ([place]) 1997  . Melanoma in situ of upper extremity ([place]) 03/19/2011  . Persistent atrial fibrillation ([place])    a. CHADS2VASc => 4 (HTN, age x 2, female)  . Personal history of radiation therapy 2011   BREAST CA  . Seroma    HISTORY OF LFT BREAST  . Sleep apnea   . Thyroid cancer ([place]) 1992   Past Surgical History:  Procedure Laterality Date  . ABDOMINAL HYSTERECTOMY  1973   partial  . BREAST BIOPSY Left 02-13-13   BENIGN BREAST TISSUE WITH CHANGES CONSISTENT WITH FAT NECROSIS  . BREAST BIOPSY Left 01/21/2015   bx done in [facility] office 11:00 left 6-8cmfn  . BREAST EXCISIONAL BIOPSY Left 1995   neg  . BREAST EXCISIONAL BIOPSY Left 2011   Breast cancer radiation  . BREAST LUMPECTOMY Left 2011   BREAST CA  . CARDIAC CATHETERIZATION    . CHOLECYSTECTOMY    . COLONOSCOPY  2013  . MELANOMA EXCISION     RT UPPER ARM  . PACEMAKER IMPLANT N/A 01/10/2020   Procedure: PACEMAKER IMPLANT;  Surgeon: Constance Haw, MD;  Location: [facility];  Service: Cardiovascular;  Laterality: N/A;  . PARTIAL HYSTERECTOMY     bleeding, ovaries in place.    . THYROID SURGERY  1992   FOR THYROID CANCER  . TONSILLECTOMY     Family History  Problem Relation Age of Onset  . Heart disease Mother   . Cancer Brother        lung   . Cancer Sister        breast  . Breast cancer Neg Hx    Social History   Socioeconomic History  . Marital status: Widowed    Spouse name: Not on file  . Number of children: Not on file  . Years of education: Not on file  . Highest education level: Not on file  Occupational History  . Not on file  Tobacco Use  . Smoking status: Never Smoker  . Smokeless tobacco: Never Used  Substance and  Sexual Activity  . Alcohol use: Yes    Alcohol/week: 0.0 standard drinks    Comment: social drinking. average times a week  . Drug use: No  . Sexual activity: Never  Other Topics Concern  . Not on file  Social History Narrative   Independent and baseline. Lives by herself   Social Determinants of Health   Financial Resource Strain: Low Risk   . Difficulty of Paying Living Expenses: Not hard at all  Food Insecurity: No Food Insecurity  . Worried About Charity fundraiser in the Last Year: Never true  . Ran  Out of Food in the Last Year: Never true  Transportation Needs: No Transportation Needs  . Lack of Transportation (Medical): No  . Lack of Transportation (Non-Medical): No  Physical Activity: Sufficiently Active  . Days of Exercise per Week: 4 days  . Minutes of Exercise per Session: 40 min  Stress: No Stress Concern Present  . Feeling of Stress : Not at all  Social Connections: Unknown  . Frequency of Communication with Friends and Family: More than three times a week  . Frequency of Social Gatherings with Friends and Family: More than three times a week  . Attends Religious Services: Not on file  . Active Member of Clubs or Organizations: Not on file  . Attends Archivist Meetings: Not on file  . Marital Status: Widowed    Tobacco Counseling Counseling given: Not Answered   Clinical Intake:  Pre-visit preparation completed: Yes        Diabetes: No  How often do you need to have someone help you when you read instructions, pamphlets, or other written materials from your doctor or pharmacy?: 1 - Never  Interpreter Needed?: No      Activities of Daily Living In your present state of health, do you have any difficulty performing the following activities: 07/27/2020 01/10/2020  Hearing? N N  Vision? N N  Difficulty concentrating or making decisions? N N  Walking or climbing stairs? Y N  Comment Chronic R knee pain. Receives injections prn. Paces  self. -  Dressing or bathing? N N  Doing errands, shopping? N N  Preparing Food and eating ? Y -  Comment Staff prepares meals. Self feeds. -  Using the Toilet? N -  In the past six months, have you accidently leaked urine? N -  Do you have problems with loss of bowel control? N -  Managing your Medications? N -  Managing your Finances? Y -  Comment Daughter assist as needed. -  Housekeeping or managing your Housekeeping? N -  Some recent data might be hidden    Patient Care Team: Einar Pheasant, MD as PCP - General (Internal Medicine) Minna Merritts, MD as PCP - Cardiology (Cardiology) Constance Haw, MD as PCP - Electrophysiology (Cardiology) Bary Castilla, Forest Gleason, MD (General Surgery) Rocco Serene, MD (Internal Medicine)  Indicate any recent Medical Services you may have received from other than [facility] providers in the past year (date may be approximate).     Assessment:   This is a routine wellness examination for [place].  I connected with Kerri Mills today by telephone and verified that I am speaking with the correct person using two identifiers. Location patient: home Location provider: work Persons participating in the virtual visit: patient, Marine scientist.    I discussed the limitations, risks, security and privacy concerns of performing an evaluation and management service by telephone and the availability of in person appointments. The patient expressed understanding and verbally consented to this telephonic visit.    Interactive audio and video telecommunications were attempted between this provider and patient, however failed, due to patient having technical difficulties OR patient did not have access to video capability.  We continued and completed visit with audio only.  Some vital signs may be absent or patient reported.   Hearing/Vision screen  Hearing Screening   125Hz  250Hz  500Hz  1000Hz  2000Hz  3000Hz  4000Hz  6000Hz  8000Hz   Right ear:           Left ear:  Comments: Patient has some difficulty hearing conversational tones. Audiology deferred per patient request.  Vision Screening Comments: Follow[facility]  Wears corrective lenses Wet macular degeneraton; receives injections  Cataract extraction, bilateral  They have regular follow up with the ophthalmologist  Dietary issues and exercise activities discussed: Current Exercise Habits: Home exercise routine, Time (Minutes): 40, Frequency (Times/Week): 4, Weekly Exercise (Minutes/Week): 160, Intensity: Mild  Goals    . Follow up with Primary Care Provider     As needed      Depression Screen PHQ 2/9 Scores 07/27/2020 03/02/2020 07/25/2019 06/06/2019 03/25/2019 07/19/2018 07/03/2017  PHQ - 2 Score 0 0 0 0 0 0 1  PHQ- 9 Score - 1 - - - - 3    Fall Risk Fall Risk  07/27/2020 03/02/2020 07/25/2019 06/06/2019 09/21/2018  Falls in the past year? 0 0 1 1 1   Number falls in past yr: 0 - (No Data) 1 0  Comment - - None new since last reported 06/13/19 - -  Injury with Fall? 0 - - 1 1  Comment - - - - -  Risk for fall due to : - History of fall(s) - History of fall(s) -  Risk for fall due to: Comment - - - - -  Follow up Falls evaluation completed Falls evaluation completed - - -   Handrails in use when climbing stairs? Yes Home free of loose throw rugs in walkways, pet beds, electrical cords, etc? Yes  Adequate lighting in your home to reduce risk of falls? Yes   ASSISTIVE DEVICES UTILIZED TO PREVENT FALLS: Use of a cane, walker or w/c? No  Grab bars in the bathroom? Yes   TIMED UP AND GO: Was the test performed? No . Virtual visit.    Cognitive Function: Patient is alert and oriented x3.  Denies difficulty focusing, making decisions, memory loss.  Enjoys painting/water color, word search, ipad use.   MMSE - Mini Mental State Exam 07/03/2017 07/01/2016 07/01/2015  Orientation to time 5 5 5   Orientation to Place 5 5 5   Registration 3 3 3   Attention/ Calculation 5 5 5    Recall 3 3 3   Language- name 2 objects 2 2 2   Language- repeat 1 1 1   Language- follow 3 step command 3 3 3   Language- read & follow direction 1 1 1   Write a sentence 1 1 1   Copy design 1 1 1   Total score 30 30 30      6CIT Screen 07/27/2020 07/25/2019 07/19/2018  What Year? 0 points 0 points 0 points  What month? 0 points 0 points 0 points  What time? 0 points 0 points 0 points  Count back from 20 0 points 0 points 0 points  Months in reverse 0 points 0 points 0 points  Repeat phrase - 0 points 0 points  Total Score - 0 0    Immunizations Immunization History  Administered Date(s) Administered  . Fluad Quad(high Dose 65+) 06/06/2019, 07/07/2020  . Influenza Split 07/18/2014  . Influenza, High Dose Seasonal PF 07/01/2015, 06/13/2016, 05/23/2017, 06/25/2018  . Influenza,inj,Quad PF,6+ Mos 06/11/2013  . Moderna SARS-COVID-2 Vaccination 10/08/2019, 11/05/2019, 07/24/2020  . Pneumococcal Conjugate-13 12/18/2013  . Pneumococcal Polysaccharide-23 07/01/2015  . Tdap 11/15/2016  . Zoster Recombinat (Shingrix) 06/25/2018, 07/13/2018, 11/07/2018   Health Maintenance There are no preventive care reminders to display for this patient. Health Maintenance  Topic Date Due  . MAMMOGRAM  12/18/2020  . TETANUS/TDAP  11/15/2026  . INFLUENZA VACCINE  Completed  . DEXA SCAN  Completed  . COVID-19 Vaccine  Completed  . PNA vac Low Risk Adult  Completed    Colorectal cancer screening: No longer required.     Mammogram status: Completed 12/18/18. Repeat every year   Bone density- Last exam 04-03-17. Osteoporosis. Vitamin D 1000 U daily. Deferred referral per patient preference.   Lung Cancer Screening: (Low Dose CT Chest recommended if Age 71-80 years, 30 pack-year currently smoking OR have quit w/in 15years.) does not qualify.   Hepatitis C Screening: does not qualify.  Vision Screening: Recommended annual ophthalmology exams for early detection of glaucoma and other disorders of the  eye. Is the patient up to date with their annual eye exam?  Yes  Who is the provider or what is the name of the office in which the patient attends annual eye exams? [facility] Dr. George Ina.   Dental Screening: Recommended annual dental exams for proper oral hygiene. Visits every 6 months.   Community Resource Referral / Chronic Care Management: CRR required this visit?  No   CCM required this visit?  No      Plan:   Keep all routine maintenance appointments.   Next scheduled lab 10/07/20 @ 10:00  Follow up 10/09/20 @ 10:00  Nurse notes: Patient request referral to [facility] R knee injection, chronic knee pain. She is going on a cruise 08/22/20 and would like to have the injection prior to date, if possible. Also request medication to avoid motion sickness when traveling. Last visit with primary care provider 07/07/20. Deferred to pcp.   I have personally reviewed and noted the following in the patient's chart:   . Medical and social history . Use of alcohol, tobacco or illicit drugs  . Current medications and supplements . Functional ability and status . Nutritional status . Physical activity . Advanced directives . List of other physicians . Hospitalizations, surgeries, and ER visits in previous 12 months . Vitals . Screenings to include cognitive, depression, and falls . Referrals and appointments  In addition, I have reviewed and discussed with patient certain preventive protocols, quality metrics, and best practice recommendations. A written personalized care plan for preventive services as well as general preventive health recommendations were provided to patient via mail.     Varney Biles, LPN   [id number]

## 2020-07-28 ENCOUNTER — Telehealth: Payer: Self-pay | Admitting: Internal Medicine

## 2020-07-28 DIAGNOSIS — M25569 Pain in unspecified knee: Secondary | ICD-10-CM

## 2020-07-28 MED ORDER — SCOPOLAMINE 1 MG/3DAYS TD PT72: 1.0000 | MEDICATED_PATCH | TRANSDERMAL | 0 refills | Status: DC

## 2020-07-28 NOTE — Progress Notes (Signed)
Order placed for referral to [facility] and rx sent in for scopolamine patches.

## 2020-07-28 NOTE — Telephone Encounter (Signed)
I received notification from [place] that Kerri Mills requested a referral to ortho for knee pain.  Order for referral placed to [facility] as requested.  Someone should be contacting her with appt date and time.  Also, she is going on a cruise and requested medication to avoid motion sickness. I sent in scopolamine patches.  Please notify pt.

## 2020-07-28 NOTE — Telephone Encounter (Signed)
Heather from [facility] called in [phone] about a referral for patient

## 2020-07-28 NOTE — Telephone Encounter (Signed)
Left detailed message for patient.

## 2020-07-28 NOTE — Telephone Encounter (Signed)
Ok Thank you!   I returned Kerri Mills call and left a vm to call me back.

## 2020-07-29 ENCOUNTER — Telehealth: Payer: Self-pay | Admitting: Internal Medicine

## 2020-07-29 NOTE — Telephone Encounter (Signed)
Good morning!  Ok

## 2020-07-29 NOTE — Telephone Encounter (Signed)
Rejection Reason - Patient went elsewhere - Pt to schedule at [facility]" [facility] said on Jul 28, 2020 12:26 PM

## 2020-08-02 NOTE — Progress Notes (Signed)
Cardiology Office Note  Date:  08/03/2020   ID:  Kerri Mills, DOB 1931-09-09, MRN [id number]  PCP:  Einar Pheasant, MD   Chief Complaint  Patient presents with  . Follow-up    6 months  Pt states she gets SOB occasionally on exertion, no other Sx.    HPI:  Kerri Mills is a 86 y.o. female with  persistent atrial fibrillation, on anticoagulation SSS, pacer hypertension,  hyperlipidemia,  Demand ischemia Shortness of breath LBBB,  OSA,  breast cancer s/p lumpectomy and XRT,  on the left Hospital admission September 30, 2017 for chest pain Evaluated by cardiology for atrial fibrillation with RVR and chest pain  She presents today for follow-up after recent hospitalization, follow-up of her chest pain and A. Fib.  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 She was trying to get to the nearest table but suddenly lost consciousness  Pauses 6 sec on zio monitor off metoprolol  Implantation of a [employer]urity MRI model [id number] (serial number [id number] ) pacemaker on 01/10/20 by Dr. Curt Bears  Echo  12/10/2018 that showed mild wall thickening and bilateral bronchiectasis   hospital 09/30/2017 Nauseous chest pressure short of breath and tremulous.   called EMS and was found to be in atrial fibrillation with rates in the 120s.   admitted at [facility] where she was in atrial fibrillation at 120 bpm.   LBBB was noted on EKG.   INR the day prior to admission was 3.1.   started on a diltiazem  infusion  orthopnea overnight.  Bradycardia on diltiazem and metoprolol   PMH:   has a past medical history of Allergy, Anxiety, Arthritis, Atypical chest pain, Clotting disorder ([place]), Colitis, Depression, Diverticulitis (2013), Gastric ulcer, GERD (gastroesophageal reflux disease), History of echocardiogram, Hypercholesterolemia, Hypertension, Infiltrating lobular carcinoma of left breast (2011), Melanoma ([place]) (1997), Melanoma in situ of upper extremity ([place]) (03/19/2011), Persistent atrial fibrillation ([place]), Personal history of radiation therapy (2011), Seroma, Sleep apnea, and Thyroid cancer ([place]) (1992).  PSH:    Past Surgical History:  Procedure Laterality Date  . ABDOMINAL HYSTERECTOMY  1973   partial  . BREAST BIOPSY Left 02-13-13   BENIGN BREAST TISSUE WITH CHANGES CONSISTENT WITH FAT NECROSIS  . BREAST BIOPSY Left 01/21/2015   bx done in [facility] 11:00 left 6-8cmfn  . BREAST EXCISIONAL BIOPSY Left 1995   neg  . BREAST EXCISIONAL BIOPSY Left 2011   Breast cancer radiation  . BREAST LUMPECTOMY Left 2011   BREAST CA  . CARDIAC CATHETERIZATION    . CHOLECYSTECTOMY    . COLONOSCOPY  2013  . MELANOMA EXCISION     RT UPPER ARM  . PACEMAKER IMPLANT N/A 01/10/2020   Procedure: PACEMAKER IMPLANT;  Surgeon: Constance Haw, MD;  Location: [place] [facility];  Service: Cardiovascular;  Laterality: N/A;  . PARTIAL HYSTERECTOMY     bleeding, ovaries in place.    . THYROID SURGERY  1992   FOR THYROID CANCER  . TONSILLECTOMY  Current Outpatient Medications  Medication Sig Dispense Refill  . albuterol (PROAIR HFA) 108 (90 Base) MCG/ACT inhaler Inhale 2 puffs into the lungs every 6 (six) hours as needed for wheezing or shortness of breath. 1 Inhaler 3  . Cholecalciferol (VITAMIN D) 1000 UNITS capsule Take 1,000 Units by mouth daily.      . DULoxetine (CYMBALTA) 60 MG capsule TAKE 1 CAPSULE (60 MG TOTAL) BY MOUTH AT BEDTIME. 90 capsule 1  . ELIQUIS 5 MG TABS tablet TAKE 1  TABLET BY MOUTH TWICE A DAY 180 tablet 1  . gabapentin (NEURONTIN) 300 MG capsule TAKE 2 CAPSULES (600 MG TOTAL) BY MOUTH AT BEDTIME. 180 capsule 1  . levothyroxine (SYNTHROID) 100 MCG tablet Take 1 tablet (100 mcg total) by mouth daily. 90 tablet 1  . lovastatin (MEVACOR) 40 MG tablet TAKE 1 TABLET BY MOUTH EVERY DAY 90 tablet 3  . metoprolol tartrate (LOPRESSOR) 25 MG tablet TAKE 1 TABLET BY MOUTH TWICE A DAY 180 tablet 3  . Multiple Vitamins-Minerals (PRESERVISION AREDS 2) CAPS Take 1 tablet by mouth 2 (two) times daily.     . mupirocin ointment (BACTROBAN) 2 % Apply to affected area bid 22 g 0  . nystatin cream (MYCOSTATIN) Apply 1 application topically 2 (two) times daily. 60 g 0  . omeprazole (PRILOSEC) 20 MG capsule TAKE 1 CAPSULE BY MOUTH EVERY DAY (Patient taking differently: Take 20 mg by mouth daily. ) 90 capsule 3  . Polyethyl Glycol-Propyl Glycol (SYSTANE OP) Place 1 drop into both eyes daily as needed (for dry eyes).    . potassium chloride (KLOR-CON) 10 MEQ tablet TAKE 2 TABLETS (20 MEQ TOTAL) BY MOUTH AS DIRECTED. TAKE 2 TABLETS (20 MEQ) TWICE A WEEK WITH LASIX 36 tablet 1  . scopolamine (TRANSDERM-SCOP, 1.5 MG,) 1 MG/3DAYS Place 1 patch (1.5 mg total) onto the skin every 3 (three) days. 10 patch 0  . White Petrolatum-Mineral Oil (GENTEAL TEARS NIGHT-TIME OP) Place 1 application into both eyes at bedtime. Night time ointment 3.5g     . furosemide (LASIX) 20 MG tablet TAKE 1 TABLET (20 MG TOTAL) BY MOUTH AS DIRECTED. TAKE 1 TABLET (20 MG) TWICE A WEEK WITH POTASSIUM (Patient taking differently: Take 20 mg by mouth See admin instructions. Take 1 tablet (20 mg) twice a week on Tuesdays and Saturdays  with potassium) 24 tablet 3   No current facility-administered medications for this visit.     Allergies:   Atorvastatin, Lipitor [atorvastatin calcium], and Penicillins   Social History:  The patient  reports that she has never smoked. She has never used smokeless tobacco. She reports  current alcohol use. She reports that she does not use drugs.   Family History:   family history includes Cancer in her brother and sister; Heart disease in her mother.   Review of Systems  Constitutional: Negative.   HENT: Negative.   Eyes: Negative.   Respiratory: Negative.  Negative for shortness of breath.   Cardiovascular: Negative.   Gastrointestinal: Negative.   Genitourinary: Negative.   Musculoskeletal: Negative.   Neurological: Negative.  Negative for dizziness and headaches.  Psychiatric/Behavioral: Negative.   All other systems reviewed and are negative.   PHYSICAL EXAM: VS:  BP 140/82   Pulse 78   Ht 5\' 4"  (1.626 m)   Wt 181 lb (82.1 kg)   BMI 31.07 kg/m  , BMI Body mass index is 31.07 kg/m.  Constitutional:  oriented to person, place, and time. No distress.  HENT:  Head:  Grossly normal Eyes:  no discharge. No scleral icterus.  Neck: No JVD, no carotid bruits  Cardiovascular: Regular rate and rhythm, no murmurs appreciated Pulmonary/Chest: Clear to auscultation bilaterally, no wheezes or rales Abdominal: Soft.  no distension.  no tenderness.  Musculoskeletal: Normal range of motion Neurological:  normal muscle tone. Coordination normal. No atrophy Skin: Skin warm and dry Psychiatric: normal affect, pleasant   Recent Labs: 01/10/2020: Hemoglobin 13.0; Magnesium 2.1; Platelets 210 06/02/2020: ALT 10; ALT 10; BUN 15; Creatinine, Ser 0.92; Potassium 4.9; Sodium 142 07/07/2020: TSH 1.24    Lipid Panel Lab Results  Component Value Date   CHOL 154 06/02/2020   HDL 38.40 (L) 06/02/2020   LDLCALC 86 06/02/2020   TRIG 146.0 06/02/2020      Wt Readings from Last 3 Encounters:  08/03/20 181 lb (82.1 kg)  07/27/20 180 lb (81.6 kg)  07/07/20 180 lb 12.8 oz (82 kg)     ASSESSMENT AND PLAN:  Persistent atrial fibrillation (HCC) - Plan: EKG 12-Lead Normal sinus rhythm, Pacer downloads reviewed with her  Frequent PVCs Triplet noted on EKG today,  asymptomatic Monitored through pacer downloads  Essential hypertension - Blood pressure is well controlled on today's visit. No changes made to the medications.  Shortness of breath Concern for diastolic CHF Taking Lasix 2 days a week Has worsening leg swelling, Recommend she take Lasix 3 days in a row then reassess her breathing She takes Lasix with 20 potassium There is high fluid intake, recommend she will likely need more Lasix up to 4-5 times a week perhaps  Pure hypercholesterolemia -  Cholesterol at goal, no changes made   Total encounter time more than 25 minutes  Greater than 50% was spent in counseling and coordination of care with the patient    No orders of the defined types were placed in this encounter.   Yetta Glassman am acting as a Education administrator for Ida Rogue, M.D., Ph.D.  I, Ida Rogue, M.D. Ph.D., have reviewed the above documentation for accuracy and completeness, and I agree with the above.   Signed, Esmond Plants, M.D., Ph.D. 08/03/2020  [place], [place]

## 2020-08-03 ENCOUNTER — Encounter: Payer: Self-pay | Admitting: Cardiovascular Disease

## 2020-08-03 ENCOUNTER — Ambulatory Visit (INDEPENDENT_AMBULATORY_CARE_PROVIDER_SITE_OTHER): Payer: Medicare Other | Admitting: Cardiovascular Disease

## 2020-08-03 ENCOUNTER — Other Ambulatory Visit: Payer: Self-pay

## 2020-08-03 VITALS — BP 140/82 | HR 78 | Ht 64.0 in | Wt 181.0 lb

## 2020-08-03 DIAGNOSIS — R0789 Other chest pain: Secondary | ICD-10-CM

## 2020-08-03 DIAGNOSIS — I495 Sick sinus syndrome: Secondary | ICD-10-CM | POA: Diagnosis not present

## 2020-08-03 DIAGNOSIS — M1711 Unilateral primary osteoarthritis, right knee: Secondary | ICD-10-CM | POA: Diagnosis not present

## 2020-08-03 DIAGNOSIS — E7800 Pure hypercholesterolemia, unspecified: Secondary | ICD-10-CM

## 2020-08-03 DIAGNOSIS — R55 Syncope and collapse: Secondary | ICD-10-CM

## 2020-08-03 DIAGNOSIS — I10 Essential (primary) hypertension: Secondary | ICD-10-CM | POA: Diagnosis not present

## 2020-08-03 DIAGNOSIS — I4819 Other persistent atrial fibrillation: Secondary | ICD-10-CM | POA: Diagnosis not present

## 2020-08-03 MED ORDER — POTASSIUM CHLORIDE ER 10 MEQ PO TBCR: 20.0000 meq | EXTENDED_RELEASE_TABLET | Freq: Every day | ORAL | 3 refills | Status: DC | PRN

## 2020-08-03 MED ORDER — FUROSEMIDE 20 MG PO TABS
20.0000 mg | ORAL_TABLET | Freq: Every day | ORAL | 3 refills | Status: DC | PRN
Start: 2020-08-03 — End: 2021-03-17

## 2020-08-03 NOTE — Patient Instructions (Signed)
Medication Instructions:  Please take extra lasix with 2 potassium for shortness of breath Already called in  If you need a refill on your cardiac medications before your next appointment, please call your pharmacy.    Lab work: No new labs needed   If you have labs (blood work) drawn today and your tests are completely normal, you will receive your results only by: Marland Kitchen MyChart Message (if you have MyChart) OR . A paper copy in the mail If you have any lab test that is abnormal or we need to change your treatment, we will call you to review the results.   Testing/Procedures: No new testing needed   Follow-Up: At [facility], you and your health needs are our priority.  As part of our continuing mission to provide you with exceptional heart care, we have created designated Provider Care Teams.  These Care Teams include your primary Cardiologist (physician) and Advanced Practice Providers (APPs -  Physician Assistants and Nurse Practitioners) who all work together to provide you with the care you need, when you need it.  . You will need a follow up appointment in 6 months  . Providers on your designated Care Team:   . Murray Hodgkins, NP . Christell Faith, PA-C . Marrianne Mood, PA-C  Any Other Special Instructions Will Be Listed Below (If Applicable).  COVID-19 Vaccine Information can be found at: ShippingScam.co.uk For questions related to vaccine distribution or appointments, please email vaccine@[email or web address] .[email or web address] or call [phone].

## 2020-08-04 ENCOUNTER — Telehealth: Payer: Self-pay | Admitting: Internal Medicine

## 2020-08-04 DIAGNOSIS — R0602 Shortness of breath: Secondary | ICD-10-CM

## 2020-08-04 NOTE — Telephone Encounter (Signed)
Ok to print DME for rollator walker?

## 2020-08-04 NOTE — Telephone Encounter (Signed)
Fax number is [phone]

## 2020-08-04 NOTE — Telephone Encounter (Signed)
Patient would like a prescription for a walker. She would like a 4 wheel walker with a seat/with a pouch underneath the seat/and make sure it folds.

## 2020-08-04 NOTE — Telephone Encounter (Signed)
DME printed and patient is aware. She is going to call back with a fax number it needs to be sent to.

## 2020-08-04 NOTE — Telephone Encounter (Signed)
yes

## 2020-08-04 NOTE — Telephone Encounter (Signed)
I have faxed to given number.

## 2020-08-10 DIAGNOSIS — J410 Simple chronic bronchitis: Secondary | ICD-10-CM | POA: Diagnosis not present

## 2020-08-10 DIAGNOSIS — R0602 Shortness of breath: Secondary | ICD-10-CM | POA: Diagnosis not present

## 2020-08-10 DIAGNOSIS — J479 Bronchiectasis, uncomplicated: Secondary | ICD-10-CM | POA: Diagnosis not present

## 2020-08-10 DIAGNOSIS — G4733 Obstructive sleep apnea (adult) (pediatric): Secondary | ICD-10-CM | POA: Diagnosis not present

## 2020-08-14 ENCOUNTER — Telehealth: Payer: Self-pay | Admitting: Cardiovascular Disease

## 2020-08-14 NOTE — Telephone Encounter (Signed)
Patient has 3 month supply in place. Will forward to PharmD for review.

## 2020-08-14 NOTE — Telephone Encounter (Signed)
Pt c/o medication issue:  1. Name of Medication: Eliquis 5 mg bid  2. How are you currently taking this medication (dosage and times per day)? See above  3. Are you having a reaction (difficulty breathing--STAT)? N/a  4. What is your medication issue? Patients insurance will not cover this medication at all next year, patient will be responsible for 100%. Patient would like to know if there is an alternative as warfarin or xarelto as suggested by [facility]

## 2020-08-14 NOTE — Telephone Encounter (Signed)
Called pt to let her know that if she would like to stay on Eliquis we can submit a prior auth in Jan to try to get med approved. Or she can be switched to Xarelto. LVM for her to call back

## 2020-08-17 NOTE — Telephone Encounter (Signed)
Spoke with patient who reports she would prefer to stay on Eliquis.  Has enough tablets until February. Will complete PA in 2022

## 2020-08-30 ENCOUNTER — Other Ambulatory Visit: Payer: Self-pay | Admitting: Internal Medicine

## 2020-09-04 ENCOUNTER — Telehealth: Payer: Self-pay | Admitting: Internal Medicine

## 2020-09-04 MED ORDER — TRIAMCINOLONE ACETONIDE 55 MCG/ACT NA AERO: 2.0000 | INHALATION_SPRAY | Freq: Every evening | NASAL | 0 refills | Status: DC

## 2020-09-04 NOTE — Telephone Encounter (Signed)
Patient was sneezing a lot yesterday and today she is sneezing a little. She did take some medication for it . She wants to know what she should do.

## 2020-09-04 NOTE — Telephone Encounter (Signed)
Patient is having little nasal discharge. But she is sneezing a lot. No other sx. Pleas advise.

## 2020-09-04 NOTE — Telephone Encounter (Signed)
Appears to not be having any other symptoms.  With sneezing and nasal drainage, appears to be c/w allergies.  If no allergy to steroid nasal spray, then can use nasacort nasal spray - 2 sprays each nostril one time per day.  Do this in the evening.  If symptoms progress, will need to be evaluated.  Let me know if any other problems.

## 2020-09-04 NOTE — Telephone Encounter (Signed)
Patient has been informed. She does not have any allergies to steroid nasal sprays. [facility]

## 2020-09-08 ENCOUNTER — Telehealth: Payer: Self-pay | Admitting: Internal Medicine

## 2020-09-08 DIAGNOSIS — R300 Dysuria: Secondary | ICD-10-CM

## 2020-09-08 NOTE — Telephone Encounter (Signed)
Patient has been sneezing a lot this past week and she kept leaking urine,and becoming wet.Today patient is having painful urination, burning feeling during urination. She believes she has a UTI. Patient does not wish to go to UC. Please advise.

## 2020-09-08 NOTE — Telephone Encounter (Signed)
Please call and see how pt is doing.  Can she leave a urine and do a virtual visit.  If any acute symptoms, needs to be seen.

## 2020-09-08 NOTE — Telephone Encounter (Signed)
Patient thinks she has a UTI. No appointments available at this time in office.

## 2020-09-08 NOTE — Telephone Encounter (Signed)
LMTCB

## 2020-09-09 ENCOUNTER — Other Ambulatory Visit (INDEPENDENT_AMBULATORY_CARE_PROVIDER_SITE_OTHER): Payer: Medicare Other

## 2020-09-09 ENCOUNTER — Other Ambulatory Visit: Payer: Self-pay

## 2020-09-09 DIAGNOSIS — R300 Dysuria: Secondary | ICD-10-CM | POA: Diagnosis not present

## 2020-09-09 DIAGNOSIS — R0602 Shortness of breath: Secondary | ICD-10-CM | POA: Diagnosis not present

## 2020-09-09 DIAGNOSIS — G4733 Obstructive sleep apnea (adult) (pediatric): Secondary | ICD-10-CM | POA: Diagnosis not present

## 2020-09-09 DIAGNOSIS — J479 Bronchiectasis, uncomplicated: Secondary | ICD-10-CM | POA: Diagnosis not present

## 2020-09-09 DIAGNOSIS — J410 Simple chronic bronchitis: Secondary | ICD-10-CM | POA: Diagnosis not present

## 2020-09-09 LAB — URINALYSIS, ROUTINE W REFLEX MICROSCOPIC

## 2020-09-09 NOTE — Telephone Encounter (Signed)
LMTCB

## 2020-09-09 NOTE — Telephone Encounter (Signed)
Confirmed nothing acute that needs to be addressed today. Patient coming in to leave urine sample and has appointment scheduled

## 2020-09-10 ENCOUNTER — Telehealth (INDEPENDENT_AMBULATORY_CARE_PROVIDER_SITE_OTHER): Payer: Medicare Other | Admitting: Internal Medicine

## 2020-09-10 ENCOUNTER — Encounter: Payer: Self-pay | Admitting: Internal Medicine

## 2020-09-10 DIAGNOSIS — K5900 Constipation, unspecified: Secondary | ICD-10-CM | POA: Diagnosis not present

## 2020-09-10 DIAGNOSIS — N3000 Acute cystitis without hematuria: Secondary | ICD-10-CM

## 2020-09-10 MED ORDER — ALIGN PO CAPS: ORAL_CAPSULE | ORAL | 0 refills | Status: DC

## 2020-09-10 MED ORDER — POLYETHYLENE GLYCOL 3350 17 GM/SCOOP PO POWD: 17.0000 g | Freq: Every day | ORAL | 1 refills | Status: DC

## 2020-09-10 MED ORDER — NITROFURANTOIN MONOHYD MACRO 100 MG PO CAPS: 100.0000 mg | ORAL_CAPSULE | Freq: Two times a day (BID) | ORAL | 0 refills | Status: DC

## 2020-09-10 NOTE — Progress Notes (Signed)
Patient ID: Kerri Mills, female   DOB: 1931-06-21, 86 y.o.   MRN: [id number]   Virtual Visit via telpehone Note  This visit type was conducted due to national recommendations for restrictions regarding the COVID-19 pandemic (e.g. social distancing).  This format is felt to be most appropriate for this patient at this time.  All issues noted in this document were discussed and addressed.  No physical exam was performed (except for noted visual exam findings with Video Visits).   I connected with Kerri Mills today by telephone and verified that I am speaking with the correct person using two identifiers. Location patient: home Location provider: work  Persons participating in the telephone visit: patient, provider  The limitations, risks, security and privacy concerns of performing an evaluation and management service by telephone and the availability of in person appointments have been discussed. It has also been discussed with the patient that there may be a patient responsible charge related to this service. The patient expressed understanding and agreed to proceed.   Reason for visit: work in appt   HPI: Work in for possible UTI.  Went on a cruise and had diarrhea on the cruise. Also noticed some increased sneezing recently.  Better now.  Discussed nasacort.  Then developed UTI symptoms.  States symptoms started beginning of this week.  She noticed dysuria.  No hematuria.  End urination discomfort. She is eating.  No nausea or vomiting.  No diarrhea now.  Took otc medication (like AZO) to help with pressure in lower abdomen.  Discussed constipation.  Needs something to help bowels move.     ROS: See pertinent positives and negatives per HPI.  Past Medical History:  Diagnosis Date  . Allergy   . Anxiety   . Arthritis   . Atypical chest pain    a. 10/2018 MV: small, fixed apical defect possibly 2/2 attenuation artifact. No ischemia.  EF 68%.  . Clotting disorder ([place])   . Colitis   .  Depression   . Diverticulitis 2013  . Gastric ulcer   . GERD (gastroesophageal reflux disease)   . History of echocardiogram    a. 09/2019 Echo: EF 55-60%, mod LVH. Mildly dil LA. Triv MR/TR.  Marland Kitchen Hypercholesterolemia   . Hypertension   . Infiltrating lobular carcinoma of left breast 2011   T2,N0, ER: 90%; PR 0%; Her 2 neu not amplified. [facility]).  . Melanoma ([place]) 1997  . Melanoma in situ of upper extremity ([place]) 03/19/2011  . Persistent atrial fibrillation ([place])    a. CHADS2VASc => 4 (HTN, age x 2, female)  . Personal history of radiation therapy 2011   BREAST CA  . Seroma    HISTORY OF LFT BREAST  . Sleep apnea   . Thyroid cancer ([place]) 1992    Past Surgical History:  Procedure Laterality Date  . ABDOMINAL HYSTERECTOMY  1973   partial  . BREAST BIOPSY Left 02-13-13   BENIGN BREAST TISSUE WITH CHANGES CONSISTENT WITH FAT NECROSIS  . BREAST BIOPSY Left 01/21/2015   bx done in [facility] 11:00 left 6-8cmfn  . BREAST EXCISIONAL BIOPSY Left 1995   neg  . BREAST EXCISIONAL BIOPSY Left 2011   Breast cancer radiation  . BREAST LUMPECTOMY Left 2011   BREAST CA  . CARDIAC CATHETERIZATION    . CHOLECYSTECTOMY    . COLONOSCOPY  2013  . MELANOMA EXCISION     RT UPPER ARM  . PACEMAKER IMPLANT N/A 01/10/2020   Procedure: PACEMAKER IMPLANT;  Surgeon: Constance Haw, MD;  Location: [place] [facility];  Service: Cardiovascular;  Laterality: N/A;  . PARTIAL HYSTERECTOMY     bleeding, ovaries in place.    . THYROID SURGERY  1992   FOR THYROID CANCER  . TONSILLECTOMY      Family History  Problem Relation Age of Onset  . Heart disease Mother   . Cancer Brother        lung   . Cancer Sister        breast  . Breast cancer Neg Hx     SOCIAL HX: reviewed.    Current Outpatient Medications:  .  albuterol (PROAIR HFA) 108 (90 Base) MCG/ACT inhaler, Inhale 2 puffs into the lungs every 6 (six) hours as needed for wheezing or shortness of breath., Disp: 1 Inhaler,  Rfl: 3 .  bifidobacterium infantis (ALIGN) capsule, Take 1 capsule by mouth daily for 3 weeks., Disp: 30 capsule, Rfl: 0 .  Cholecalciferol (VITAMIN D) 1000 UNITS capsule, Take 1,000 Units by mouth daily.  , Disp: , Rfl:  .  DULoxetine (CYMBALTA) 60 MG capsule, TAKE 1 CAPSULE (60 MG TOTAL) BY MOUTH AT BEDTIME., Disp: 90 capsule, Rfl: 1 .  ELIQUIS 5 MG TABS tablet, TAKE 1 TABLET BY MOUTH TWICE A DAY, Disp: 180 tablet, Rfl: 1 .  furosemide (LASIX) 20 MG tablet, Take 1 tablet (20 mg total) by mouth daily as needed. Take with potassium, Disp: 90 tablet, Rfl: 3 .  gabapentin (NEURONTIN) 300 MG capsule, TAKE 2 CAPSULES (600 MG TOTAL) BY MOUTH AT BEDTIME., Disp: 180 capsule, Rfl: 1 .  levothyroxine (SYNTHROID) 100 MCG tablet, Take 1 tablet (100 mcg total) by mouth daily., Disp: 90 tablet, Rfl: 1 .  lovastatin (MEVACOR) 40 MG tablet, TAKE 1 TABLET BY MOUTH EVERY DAY, Disp: 90 tablet, Rfl: 3 .  metoprolol tartrate (LOPRESSOR) 25 MG tablet, TAKE 1 TABLET BY MOUTH TWICE A DAY, Disp: 180 tablet, Rfl: 3 .  Multiple Vitamins-Minerals (PRESERVISION AREDS 2) CAPS, Take 1 tablet by mouth 2 (two) times daily. , Disp: , Rfl:  .  mupirocin ointment (BACTROBAN) 2 %, Apply to affected area bid, Disp: 22 g, Rfl: 0 .  nitrofurantoin, macrocrystal-monohydrate, (MACROBID) 100 MG capsule, Take 1 capsule (100 mg total) by mouth 2 (two) times daily., Disp: 10 capsule, Rfl: 0 .  nystatin cream (MYCOSTATIN), Apply 1 application topically 2 (two) times daily., Disp: 60 g, Rfl: 0 .  omeprazole (PRILOSEC) 20 MG capsule, TAKE 1 CAPSULE BY MOUTH EVERY DAY (Patient taking differently: Take 20 mg by mouth daily. ), Disp: 90 capsule, Rfl: 3 .  Polyethyl Glycol-Propyl Glycol (SYSTANE OP), Place 1 drop into both eyes daily as needed (for dry eyes)., Disp: , Rfl:  .  polyethylene glycol powder (GLYCOLAX/MIRALAX) 17 GM/SCOOP powder, Take 17 g by mouth daily., Disp: 850 g, Rfl: 1 .  potassium chloride (KLOR-CON) 10 MEQ tablet, Take 2 tablets  (20 mEq total) by mouth daily as needed. Take 2 tablets (20 mEq)  with Lasix, Disp: 180 tablet, Rfl: 3 .  scopolamine (TRANSDERM-SCOP, 1.5 MG,) 1 MG/3DAYS, Place 1 patch (1.5 mg total) onto the skin every 3 (three) days., Disp: 10 patch, Rfl: 0 .  triamcinolone (NASACORT) 55 MCG/ACT AERO nasal inhaler, Place 2 sprays into the nose at bedtime., Disp: 1 each, Rfl: 0 .  White Petrolatum-Mineral Oil (GENTEAL TEARS NIGHT-TIME OP), Place 1 application into both eyes at bedtime. Night time ointment 3.5g , Disp: , Rfl:   EXAM:  GENERAL: alert.  Sounds to be in no acute distress.  Answering questions appropriately.   PSYCH/NEURO: pleasant and cooperative, no obvious depression or anxiety, speech and thought processing grossly intact  ASSESSMENT AND PLAN:  Discussed the following assessment and plan:  Problem List Items Addressed This Visit    UTI (urinary tract infection)    Symptoms c/w UTI.  Urine dip reviewed.  Treat with macrobid.  Await urine culture.  Follow.  Stay hydrated.        Relevant Medications   nitrofurantoin, macrocrystal-monohydrate, (MACROBID) 100 MG capsule   Constipation    miralax as directed.  Follow.           I discussed the assessment and treatment plan with the patient. The patient was provided an opportunity to ask questions and all were answered. The patient agreed with the plan and demonstrated an understanding of the instructions.   The patient was advised to call back or seek an in-person evaluation if the symptoms worsen or if the condition fails to improve as anticipated.  I provided 22 minutes of non-face-to-face time during this encounter.   Einar Pheasant, MD

## 2020-09-11 LAB — URINE CULTURE
MICRO NUMBER:: 11321174
SPECIMEN QUALITY:: ADEQUATE

## 2020-09-21 ENCOUNTER — Encounter: Payer: Self-pay | Admitting: Internal Medicine

## 2020-09-21 ENCOUNTER — Telehealth: Payer: Self-pay | Admitting: Internal Medicine

## 2020-09-21 DIAGNOSIS — R300 Dysuria: Secondary | ICD-10-CM

## 2020-09-21 DIAGNOSIS — N390 Urinary tract infection, site not specified: Secondary | ICD-10-CM | POA: Insufficient documentation

## 2020-09-21 DIAGNOSIS — K5900 Constipation, unspecified: Secondary | ICD-10-CM | POA: Insufficient documentation

## 2020-09-21 NOTE — Assessment & Plan Note (Signed)
miralax as directed.  Follow.   

## 2020-09-21 NOTE — Telephone Encounter (Signed)
Patient was treated for a UTI, she has finished her antibiotics, she still has symptoms.

## 2020-09-21 NOTE — Telephone Encounter (Signed)
According to the chart , she told Trish on Dec 2 that she was much better after taking the macrobid. ,  So She will need to be retested for UTI   THE UA AND CULTURE HAVE BEEN ORDERED.  HAVE HER COME IN TOMORROW TO SUBMIT URINE SPECIMEN   I do not have any appointments this week.  Does anyone else?

## 2020-09-21 NOTE — Assessment & Plan Note (Signed)
Symptoms c/w UTI.  Urine dip reviewed.  Treat with macrobid.  Await urine culture.  Follow.  Stay hydrated.

## 2020-09-21 NOTE — Telephone Encounter (Signed)
Patient was seen 09-09-20. Symptoms are the same, only dysuria. She feels like it never went away.

## 2020-09-22 ENCOUNTER — Other Ambulatory Visit: Payer: Self-pay

## 2020-09-22 ENCOUNTER — Other Ambulatory Visit (INDEPENDENT_AMBULATORY_CARE_PROVIDER_SITE_OTHER): Payer: Medicare Other

## 2020-09-22 DIAGNOSIS — R300 Dysuria: Secondary | ICD-10-CM | POA: Diagnosis not present

## 2020-09-22 LAB — URINALYSIS, ROUTINE W REFLEX MICROSCOPIC
Bilirubin Urine: NEGATIVE
Hgb urine dipstick: NEGATIVE
Ketones, ur: NEGATIVE
Specific Gravity, Urine: 1.015 (ref 1.000–1.030)
Total Protein, Urine: NEGATIVE
Urine Glucose: NEGATIVE
Urobilinogen, UA: 1 (ref 0.0–1.0)
pH: 6 (ref 5.0–8.0)

## 2020-09-22 NOTE — Telephone Encounter (Signed)
Spoke with pt and scheduled her for an appt on Thursday.

## 2020-09-22 NOTE — Telephone Encounter (Signed)
You can add her on as a 15 minute video Thursday at 1:00 pm to discuss results , provided she submits urine specimen this morning.

## 2020-09-22 NOTE — Telephone Encounter (Signed)
Lab appointment has been scheduled today at 1115. No appointment avaiable for the rest of the week with any provider. Patient did stated she will except a mychart charge for appointment.

## 2020-09-22 NOTE — Telephone Encounter (Signed)
Patient has been added to schedule.

## 2020-09-23 ENCOUNTER — Other Ambulatory Visit: Payer: Self-pay | Admitting: Internal Medicine

## 2020-09-23 LAB — URINE CULTURE
MICRO NUMBER:: 11361856
Result:: NO GROWTH
SPECIMEN QUALITY:: ADEQUATE

## 2020-09-24 ENCOUNTER — Encounter: Payer: Self-pay | Admitting: Internal Medicine

## 2020-09-24 ENCOUNTER — Telehealth (INDEPENDENT_AMBULATORY_CARE_PROVIDER_SITE_OTHER): Payer: Medicare Other | Admitting: Internal Medicine

## 2020-09-24 DIAGNOSIS — N3000 Acute cystitis without hematuria: Secondary | ICD-10-CM

## 2020-09-24 NOTE — Progress Notes (Signed)
Telephone  Note  This visit type was conducted due to national recommendations for restrictions regarding the COVID-19 pandemic (e.g. social distancing).  This format is felt to be most appropriate for this patient at this time.  All issues noted in this document were discussed and addressed.  No physical exam was performed (except for noted visual exam findings with Video Visits).   I connected with@ on 09/24/20 at  1:00 PM EST by  telephone and verified that I am speaking with the correct person using two identifiers. Location patient: home Location provider: work or home office Persons participating in the virtual visit: patient, provider  I discussed the limitations, risks, security and privacy concerns of performing an evaluation and management service by telephone and the availability of in person appointments. I also discussed with the patient that there may be a patient responsible charge related to this service. The patient expressed understanding and agreed to proceed.   Reason for visit: dysuria   HPI:  86 yr old female treated for E faecalis on Dec 16 with macrobid and Azo presents for follow up . Repeat urine test was ordered due to patient's complaint of persistent burning in her vaginal /urethral area.  Repeat UA/culture were negative for inflammation and infection.  "This is the first UTI I have ever had"  Symptoms have improved.  Discussed potential return of symptoms due to atrophic vaginitis, and need to retest if symptoms return , followed by empiric trial of vaginal estrogen if negative.     ROS: See pertinent positives and negatives per HPI.  Past Medical History:  Diagnosis Date  . Allergy   . Anxiety   . Arthritis   . Atypical chest pain    a. 10/2018 MV: small, fixed apical defect possibly 2/2 attenuation artifact. No ischemia.  EF 68%.  . Clotting disorder ([place])   . Colitis   . Depression   . Diverticulitis 2013  . Gastric ulcer   . GERD (gastroesophageal  reflux disease)   . History of echocardiogram    a. 09/2019 Echo: EF 55-60%, mod LVH. Mildly dil LA. Triv MR/TR.  Marland Kitchen Hypercholesterolemia   . Hypertension   . Infiltrating lobular carcinoma of left breast 2011   T2,N0, ER: 90%; PR 0%; Her 2 neu not amplified. [facility]).  . Melanoma ([place]) 1997  . Melanoma in situ of upper extremity ([place]) 03/19/2011  . Persistent atrial fibrillation ([place])    a. CHADS2VASc => 4 (HTN, age x 2, female)  . Personal history of radiation therapy 2011   BREAST CA  . Seroma    HISTORY OF LFT BREAST  . Sleep apnea   . Thyroid cancer ([place]) 1992    Past Surgical History:  Procedure Laterality Date  . ABDOMINAL HYSTERECTOMY  1973   partial  . BREAST BIOPSY Left 02-13-13   BENIGN BREAST TISSUE WITH CHANGES CONSISTENT WITH FAT NECROSIS  . BREAST BIOPSY Left 01/21/2015   bx done in [facility] 11:00 left 6-8cmfn  . BREAST EXCISIONAL BIOPSY Left 1995   neg  . BREAST EXCISIONAL BIOPSY Left 2011   Breast cancer radiation  . BREAST LUMPECTOMY Left 2011   BREAST CA  . CARDIAC CATHETERIZATION    . CHOLECYSTECTOMY    . COLONOSCOPY  2013  . MELANOMA EXCISION     RT UPPER ARM  . PACEMAKER IMPLANT N/A 01/10/2020   Procedure: PACEMAKER IMPLANT;  Surgeon: Constance Haw, MD;  Location: [place] [facility];  Service: Cardiovascular;  Laterality:  N/A;  . PARTIAL HYSTERECTOMY     bleeding, ovaries in place.    . THYROID SURGERY  1992   FOR THYROID CANCER  . TONSILLECTOMY      Family History  Problem Relation Age of Onset  . Heart disease Mother   . Cancer Brother        lung   . Cancer Sister        breast  . Breast cancer Neg Hx     SOCIAL HX:  reports that she has never smoked. She has never used smokeless tobacco. She reports current alcohol use. She reports that she does not use drugs.   Current Outpatient Medications:  .  albuterol (PROAIR HFA) 108 (90 Base) MCG/ACT inhaler, Inhale 2 puffs into the lungs every 6 (six) hours as needed  for wheezing or shortness of breath., Disp: 1 Inhaler, Rfl: 3 .  bifidobacterium infantis (ALIGN) capsule, Take 1 capsule by mouth daily for 3 weeks., Disp: 30 capsule, Rfl: 0 .  Cholecalciferol (VITAMIN D) 1000 UNITS capsule, Take 1,000 Units by mouth daily., Disp: , Rfl:  .  DULoxetine (CYMBALTA) 60 MG capsule, TAKE 1 CAPSULE (60 MG TOTAL) BY MOUTH AT BEDTIME., Disp: 90 capsule, Rfl: 1 .  ELIQUIS 5 MG TABS tablet, TAKE 1 TABLET BY MOUTH TWICE A DAY, Disp: 180 tablet, Rfl: 1 .  furosemide (LASIX) 20 MG tablet, Take 1 tablet (20 mg total) by mouth daily as needed. Take with potassium, Disp: 90 tablet, Rfl: 3 .  gabapentin (NEURONTIN) 300 MG capsule, TAKE 2 CAPSULES BY MOUTH AT BEDTIME, Disp: 180 capsule, Rfl: 1 .  levothyroxine (SYNTHROID) 100 MCG tablet, Take 1 tablet (100 mcg total) by mouth daily., Disp: 90 tablet, Rfl: 1 .  lovastatin (MEVACOR) 40 MG tablet, TAKE 1 TABLET BY MOUTH EVERY DAY, Disp: 90 tablet, Rfl: 3 .  metoprolol tartrate (LOPRESSOR) 25 MG tablet, TAKE 1 TABLET BY MOUTH TWICE A DAY, Disp: 180 tablet, Rfl: 3 .  Multiple Vitamins-Minerals (PRESERVISION AREDS 2) CAPS, Take 1 tablet by mouth 2 (two) times daily., Disp: , Rfl:  .  mupirocin ointment (BACTROBAN) 2 %, Apply to affected area bid, Disp: 22 g, Rfl: 0 .  nystatin cream (MYCOSTATIN), Apply 1 application topically 2 (two) times daily., Disp: 60 g, Rfl: 0 .  omeprazole (PRILOSEC) 20 MG capsule, TAKE 1 CAPSULE BY MOUTH EVERY DAY (Patient taking differently: Take 20 mg by mouth daily.), Disp: 90 capsule, Rfl: 3 .  Polyethyl Glycol-Propyl Glycol (SYSTANE OP), Place 1 drop into both eyes daily as needed (for dry eyes)., Disp: , Rfl:  .  polyethylene glycol powder (GLYCOLAX/MIRALAX) 17 GM/SCOOP powder, Take 17 g by mouth daily., Disp: 850 g, Rfl: 1 .  potassium chloride (KLOR-CON) 10 MEQ tablet, Take 2 tablets (20 mEq total) by mouth daily as needed. Take 2 tablets (20 mEq)  with Lasix, Disp: 180 tablet, Rfl: 3 .  scopolamine  (TRANSDERM-SCOP, 1.5 MG,) 1 MG/3DAYS, Place 1 patch (1.5 mg total) onto the skin every 3 (three) days., Disp: 10 patch, Rfl: 0 .  triamcinolone (NASACORT) 55 MCG/ACT AERO nasal inhaler, Place 2 sprays into the nose at bedtime., Disp: 1 each, Rfl: 0 .  White Petrolatum-Mineral Oil (GENTEAL TEARS NIGHT-TIME OP), Place 1 application into both eyes at bedtime. Night time ointment 3.5g, Disp: , Rfl:   EXAM:    General impression: alert, cooperative and articulate.  No signs of being in distress  Lungs: speech is fluent sentence length suggests that patient is not  short of breath and not punctuated by cough, sneezing or sniffing. Marland Kitchen   Psych: affect normal.  speech is articulate and non pressured .  Denies suicidal thoughts    ASSESSMENT AND PLAN:  Discussed the following assessment and plan:  Acute cystitis without hematuria  UTI (urinary tract infection) Treated to resolution with macrobid.  Persistent burning now resolving,  And repeat UA/culture negative.  Advised that repeat symptoms should be managed with UA/culture and if negative,  To consider vaginal estrogen    I discussed the assessment and treatment plan with the patient. The patient was provided an opportunity to ask questions and all were answered. The patient agreed with the plan and demonstrated an understanding of the instructions.   The patient was advised to call back or seek an in-person evaluation if the symptoms worsen or if the condition fails to improve as anticipated.  I provided 15 minutes of non-face-to-face time during this encounter.   Sherlene Shams, MD

## 2020-09-24 NOTE — Assessment & Plan Note (Signed)
Treated to resolution with macrobid.  Persistent burning now resolving,  And repeat UA/culture negative.  Advised that repeat symptoms should be managed with UA/culture and if negative,  To consider vaginal estrogen

## 2020-09-30 ENCOUNTER — Telehealth: Payer: Medicare Other | Admitting: Internal Medicine

## 2020-10-07 ENCOUNTER — Other Ambulatory Visit: Payer: Medicare Other

## 2020-10-08 ENCOUNTER — Telehealth: Payer: Self-pay

## 2020-10-08 NOTE — Telephone Encounter (Signed)
Form faxed to [place] to [employer]

## 2020-10-08 NOTE — Telephone Encounter (Signed)
Called and spoke w/pt stating that we need her to sign form allowing [place] to appeal eliquis pt voiced understanding and cannot since experiencing covid symptoms but will once they fade

## 2020-10-09 ENCOUNTER — Other Ambulatory Visit: Payer: Self-pay

## 2020-10-09 ENCOUNTER — Other Ambulatory Visit: Payer: Medicare Other

## 2020-10-09 ENCOUNTER — Telehealth (INDEPENDENT_AMBULATORY_CARE_PROVIDER_SITE_OTHER): Payer: Medicare Other | Admitting: Internal Medicine

## 2020-10-09 VITALS — Ht 65.0 in | Wt 178.0 lb

## 2020-10-09 DIAGNOSIS — K219 Gastro-esophageal reflux disease without esophagitis: Secondary | ICD-10-CM | POA: Diagnosis not present

## 2020-10-09 DIAGNOSIS — R0981 Nasal congestion: Secondary | ICD-10-CM

## 2020-10-09 DIAGNOSIS — I10 Essential (primary) hypertension: Secondary | ICD-10-CM

## 2020-10-09 DIAGNOSIS — E7800 Pure hypercholesterolemia, unspecified: Secondary | ICD-10-CM

## 2020-10-09 DIAGNOSIS — I4891 Unspecified atrial fibrillation: Secondary | ICD-10-CM

## 2020-10-09 DIAGNOSIS — D649 Anemia, unspecified: Secondary | ICD-10-CM

## 2020-10-09 DIAGNOSIS — J411 Mucopurulent chronic bronchitis: Secondary | ICD-10-CM

## 2020-10-09 DIAGNOSIS — E039 Hypothyroidism, unspecified: Secondary | ICD-10-CM | POA: Diagnosis not present

## 2020-10-09 DIAGNOSIS — Z853 Personal history of malignant neoplasm of breast: Secondary | ICD-10-CM | POA: Diagnosis not present

## 2020-10-09 NOTE — Progress Notes (Signed)
Patient ID: Kerri Mills, female   DOB: 06-Nov-1930, 87 y.o.   MRN: [id number]   Virtual Visit via telephone Note  This visit type was conducted due to national recommendations for restrictions regarding the COVID-19 pandemic (e.g. social distancing).  This format is felt to be most appropriate for this patient at this time.  All issues noted in this document were discussed and addressed.  No physical exam was performed (except for noted visual exam findings with Video Visits).   I connected with Kerri Mills by telephone and verified that I am speaking with the correct person using two identifiers. Location patient: home Location provider: work  Persons participating in the telephone visit: patient, provider  The limitations, risks, security and privacy concerns of performing an evaluation and management service by telephone and the availability of in person appointments have been discussed.  It has also been discussed with the patient that there may be a patient responsible charge related to this service. The patient expressed understanding and agreed to proceed.   Reason for visit: follow up  HPI: Follow up regarding her blood pressure, afib and cholesterol.  She reports she has been doing relatively well.  Started sneezing 6 days ago.  No fever.  Chills.  Aching in her forehead.  Increased nasal sensitivity.  Some bleeding yesterday.  No sore throat.  No chest congestion or increased sob.  Breathing overall stable.  No chest pain or cough. Took mucinex.  Started nasacort yesterday.  No increased heart rate or palpitations.  Taking eliquis. Is concerned her coverage will change.  Discussed CCM referral.  Takes lasix prn.  No vomiting.  No bowel change.     ROS: See pertinent positives and negatives per HPI.  Past Medical History:  Diagnosis Date  . Allergy   . Anxiety   . Arthritis   . Atypical chest pain    a. 10/2018 MV: small, fixed apical defect possibly 2/2 attenuation artifact. No  ischemia.  EF 68%.  . Clotting disorder ([place])   . Colitis   . Depression   . Diverticulitis 2013  . Gastric ulcer   . GERD (gastroesophageal reflux disease)   . History of echocardiogram    a. 09/2019 Echo: EF 55-60%, mod LVH. Mildly dil LA. Triv MR/TR.  Marland Kitchen Hypercholesterolemia   . Hypertension   . Infiltrating lobular carcinoma of left breast 2011   T2,N0, ER: 90%; PR 0%; Her 2 neu not amplified. [facility]).  . Melanoma ([place]) 1997  . Melanoma in situ of upper extremity ([place]) 03/19/2011  . Persistent atrial fibrillation ([place])    a. CHADS2VASc => 4 (HTN, age x 2, female)  . Personal history of radiation therapy 2011   BREAST CA  . Seroma    HISTORY OF LFT BREAST  . Sleep apnea   . Thyroid cancer ([place]) 1992    Past Surgical History:  Procedure Laterality Date  . ABDOMINAL HYSTERECTOMY  1973   partial  . BREAST BIOPSY Left 02-13-13   BENIGN BREAST TISSUE WITH CHANGES CONSISTENT WITH FAT NECROSIS  . BREAST BIOPSY Left 01/21/2015   bx done in [facility] 11:00 left 6-8cmfn  . BREAST EXCISIONAL BIOPSY Left 1995   neg  . BREAST EXCISIONAL BIOPSY Left 2011   Breast cancer radiation  . BREAST LUMPECTOMY Left 2011   BREAST CA  . CARDIAC CATHETERIZATION    . CHOLECYSTECTOMY    . COLONOSCOPY  2013  . MELANOMA EXCISION     RT UPPER ARM  .  PACEMAKER IMPLANT N/A 01/10/2020   Procedure: PACEMAKER IMPLANT;  Surgeon: Constance Haw, MD;  Location: [facility];  Service: Cardiovascular;  Laterality: N/A;  . PARTIAL HYSTERECTOMY     bleeding, ovaries in place.    . THYROID SURGERY  1992   FOR THYROID CANCER  . TONSILLECTOMY      Family History  Problem Relation Age of Onset  . Heart disease Mother   . Cancer Brother        lung   . Cancer Sister        breast  . Breast cancer Neg Hx     SOCIAL HX: reviewed.    Current Outpatient Medications:  .  albuterol (PROAIR HFA) 108 (90 Base) MCG/ACT inhaler, Inhale 2 puffs into the lungs every 6 (six) hours  as needed for wheezing or shortness of breath., Disp: 1 Inhaler, Rfl: 3 .  bifidobacterium infantis (ALIGN) capsule, Take 1 capsule by mouth daily for 3 weeks., Disp: 30 capsule, Rfl: 0 .  Cholecalciferol (VITAMIN D) 1000 UNITS capsule, Take 1,000 Units by mouth daily., Disp: , Rfl:  .  DULoxetine (CYMBALTA) 60 MG capsule, TAKE 1 CAPSULE (60 MG TOTAL) BY MOUTH AT BEDTIME., Disp: 90 capsule, Rfl: 1 .  ELIQUIS 5 MG TABS tablet, TAKE 1 TABLET BY MOUTH TWICE A DAY, Disp: 180 tablet, Rfl: 1 .  furosemide (LASIX) 20 MG tablet, Take 1 tablet (20 mg total) by mouth daily as needed. Take with potassium, Disp: 90 tablet, Rfl: 3 .  gabapentin (NEURONTIN) 300 MG capsule, TAKE 2 CAPSULES BY MOUTH AT BEDTIME, Disp: 180 capsule, Rfl: 1 .  levothyroxine (SYNTHROID) 100 MCG tablet, Take 1 tablet (100 mcg total) by mouth daily., Disp: 90 tablet, Rfl: 1 .  lovastatin (MEVACOR) 40 MG tablet, TAKE 1 TABLET BY MOUTH EVERY DAY, Disp: 90 tablet, Rfl: 3 .  metoprolol tartrate (LOPRESSOR) 25 MG tablet, TAKE 1 TABLET BY MOUTH TWICE A DAY, Disp: 180 tablet, Rfl: 3 .  Multiple Vitamins-Minerals (PRESERVISION AREDS 2) CAPS, Take 1 tablet by mouth 2 (two) times daily., Disp: , Rfl:  .  mupirocin ointment (BACTROBAN) 2 %, Apply to affected area bid, Disp: 22 g, Rfl: 0 .  nystatin cream (MYCOSTATIN), Apply 1 application topically 2 (two) times daily., Disp: 60 g, Rfl: 0 .  omeprazole (PRILOSEC) 20 MG capsule, TAKE 1 CAPSULE BY MOUTH EVERY DAY (Patient taking differently: Take 20 mg by mouth daily.), Disp: 90 capsule, Rfl: 3 .  Polyethyl Glycol-Propyl Glycol (SYSTANE OP), Place 1 drop into both eyes daily as needed (for dry eyes)., Disp: , Rfl:  .  polyethylene glycol powder (GLYCOLAX/MIRALAX) 17 GM/SCOOP powder, Take 17 g by mouth daily., Disp: 850 g, Rfl: 1 .  potassium chloride (KLOR-CON) 10 MEQ tablet, Take 2 tablets (20 mEq total) by mouth daily as needed. Take 2 tablets (20 mEq)  with Lasix, Disp: 180 tablet, Rfl: 3 .   scopolamine (TRANSDERM-SCOP, 1.5 MG,) 1 MG/3DAYS, Place 1 patch (1.5 mg total) onto the skin every 3 (three) days., Disp: 10 patch, Rfl: 0 .  triamcinolone (NASACORT) 55 MCG/ACT AERO nasal inhaler, Place 2 sprays into the nose at bedtime., Disp: 1 each, Rfl: 0 .  White Petrolatum-Mineral Oil (GENTEAL TEARS NIGHT-TIME OP), Place 1 application into both eyes at bedtime. Night time ointment 3.5g, Disp: , Rfl:   EXAM:  GENERAL: alert. Sounds to be in no acute distress.  Answering questions appropriately.   PSYCH/NEURO: pleasant and cooperative, no obvious depression or anxiety, speech  and thought processing grossly intact  ASSESSMENT AND PLAN:  Discussed the following assessment and plan:  Problem List Items Addressed This Visit    Anemia    Follow cbc.       Atrial fibrillation [facility])    S/p pacemaker placement.  On eliquis and metoprolol.  Stable.  Follow.  She is concerned that the cost of her medication will increased.  Discussed CCM referral.  She is agreeable.        Relevant Orders   AMB Referral to [place]   Essential hypertension    Blood pressure has been doing well.  Takes lasix prn.  Follow pressures.  Follow metabolic panel.        GERD (gastroesophageal reflux disease)    Controlled on omeprazole.        History of breast cancer    Mammogram 12/20/19 - Birads II.       Hypercholesterolemia    On lovastatin.  Low cholesterol diet and exercise.  Follow lipid panel and liver function tests.        Hypothyroid    On thyroid replacement.  Follow tsh.       Mucopurulent chronic bronchitis ([place])    Has been followed by pulmonary.  With sneezing, aching.  No increased sob.  Using nasacort.  Has albuterol inhaler if needed.  Continue mucinex.  Check covid swab.  Follow.         Other Visit Diagnoses    Sinus congestion    -  Primary   Relevant Orders   COVID-19, Flu A+B and RSV       I discussed the assessment and treatment plan with the  patient. The patient was provided an opportunity to ask questions and all were answered. The patient agreed with the plan and demonstrated an understanding of the instructions.   The patient was advised to call back or seek an in-person evaluation if the symptoms worsen or if the condition fails to improve as anticipated.  I provided 23 minutes of non-face-to-face time during this encounter.   Einar Pheasant, MD

## 2020-10-10 ENCOUNTER — Encounter: Payer: Self-pay | Admitting: Internal Medicine

## 2020-10-10 DIAGNOSIS — J479 Bronchiectasis, uncomplicated: Secondary | ICD-10-CM | POA: Diagnosis not present

## 2020-10-10 DIAGNOSIS — G4733 Obstructive sleep apnea (adult) (pediatric): Secondary | ICD-10-CM | POA: Diagnosis not present

## 2020-10-10 DIAGNOSIS — J410 Simple chronic bronchitis: Secondary | ICD-10-CM | POA: Diagnosis not present

## 2020-10-10 DIAGNOSIS — R0602 Shortness of breath: Secondary | ICD-10-CM | POA: Diagnosis not present

## 2020-10-10 NOTE — Assessment & Plan Note (Signed)
Has been followed by pulmonary.  With sneezing, aching.  No increased sob.  Using nasacort.  Has albuterol inhaler if needed.  Continue mucinex.  Check covid swab.  Follow.

## 2020-10-10 NOTE — Assessment & Plan Note (Signed)
S/p pacemaker placement.  On eliquis and metoprolol.  Stable.  Follow.  She is concerned that the cost of her medication will increased.  Discussed CCM referral.  She is agreeable.

## 2020-10-10 NOTE — Assessment & Plan Note (Signed)
On lovastatin.  Low cholesterol diet and exercise.  Follow lipid panel and liver function tests.   

## 2020-10-10 NOTE — Assessment & Plan Note (Signed)
On thyroid replacement.  Follow tsh.  

## 2020-10-10 NOTE — Assessment & Plan Note (Signed)
Controlled on omeprazole.   

## 2020-10-10 NOTE — Assessment & Plan Note (Signed)
Mammogram 12/20/19 - Birads II.  

## 2020-10-10 NOTE — Assessment & Plan Note (Addendum)
Blood pressure has been doing well.  Takes lasix prn.  Follow pressures.  Follow metabolic panel.

## 2020-10-10 NOTE — Assessment & Plan Note (Signed)
Follow cbc.  

## 2020-10-12 LAB — COVID-19, FLU A+B AND RSV
Influenza A, NAA: NOT DETECTED
Influenza B, NAA: NOT DETECTED
RSV, NAA: NOT DETECTED
SARS-CoV-2, NAA: NOT DETECTED

## 2020-10-13 ENCOUNTER — Telehealth: Payer: Self-pay

## 2020-10-13 NOTE — Chronic Care Management (AMB) (Signed)
  Chronic Care Management   Note  10/13/2020 Name: Kerri Mills MRN: [id number] DOB: 16-Jun-1931  Kerri Mills is a 87 y.o. year old female who is a primary care patient of Einar Pheasant, MD. I reached out to [name] by phone today in response to a referral sent by Ms. Avelina Laine Buesing's PCP, Dr. Nicki Reaper     Ms. Houseman was given information about Chronic Care Management services today including:  1. CCM service includes personalized support from designated clinical staff supervised by her physician, including individualized plan of care and coordination with other care providers 2. 24/7 contact phone numbers for assistance for urgent and routine care needs. 3. Service will only be billed when office clinical staff spend 20 minutes or more in a month to coordinate care. 4. Only one practitioner may furnish and bill the service in a calendar month. 5. The patient may stop CCM services at any time (effective at the end of the month) by phone call to the office staff. 6. The patient will be responsible for cost sharing (co-pay) of up to 20% of the service fee (after annual deductible is met).  Patient agreed to services and verbal consent obtained.   Follow up plan: Telephone appointment with care management team member scheduled for:10/30/2020  Noreene Larsson, [place], [place], [place] [address] Direct Dial: [phone] [email or web address]

## 2020-10-13 NOTE — Telephone Encounter (Signed)
Patient called States she will try to get by office tomorrow to sign paperwork

## 2020-10-13 NOTE — Chronic Care Management (AMB) (Signed)
  Chronic Care Management   Outreach Note  10/13/2020 Name: TENEISHA GIGNAC MRN: [id number] DOB: 10/04/1930  GISSELE NARDUCCI is a 87 y.o. year old female who is a primary care patient of Einar Pheasant, MD. I reached out to [name] by phone today in response to a referral sent by Ms. Avelina Laine Ikeda's PCP, Dr. Nicki Reaper      An unsuccessful telephone outreach was attempted today. The patient was referred to the case management team for assistance with care management and care coordination.   Follow Up Plan: A HIPAA compliant phone message was left for the patient providing contact information and requesting a return call.  The care management team will reach out to the patient again over the next 4 days.  If patient returns call to provider office, please advise to call [facility] at [place], [place], [place], [place] [address] Direct Dial: [phone] [email or web address]: [email or web address]

## 2020-10-14 NOTE — Telephone Encounter (Signed)
Form has been signed and faxed back to [facility].

## 2020-10-20 ENCOUNTER — Ambulatory Visit (INDEPENDENT_AMBULATORY_CARE_PROVIDER_SITE_OTHER): Payer: Medicare Other

## 2020-10-20 DIAGNOSIS — I495 Sick sinus syndrome: Secondary | ICD-10-CM

## 2020-10-20 LAB — CUP PACEART REMOTE DEVICE CHECK
Battery Remaining Longevity: 106 mo
Battery Remaining Percentage: 95.5 %
Battery Voltage: 3.01 V
Brady Statistic AP VP Percent: 2.6 %
Brady Statistic AP VS Percent: 79 %
Brady Statistic AS VP Percent: 1 %
Brady Statistic AS VS Percent: 14 %
Brady Statistic RA Percent Paced: 75 %
Brady Statistic RV Percent Paced: 2.8 %
Date Time Interrogation Session: 20220125040013
Implantable Lead Implant Date: 20210416
Implantable Lead Implant Date: 20210416
Implantable Lead Location: 753859
Implantable Lead Location: 753860
Implantable Pulse Generator Implant Date: 20210416
Lead Channel Impedance Value: 380 Ohm
Lead Channel Impedance Value: 580 Ohm
Lead Channel Pacing Threshold Amplitude: 0.75 V
Lead Channel Pacing Threshold Amplitude: 0.75 V
Lead Channel Pacing Threshold Pulse Width: 0.5 ms
Lead Channel Pacing Threshold Pulse Width: 0.5 ms
Lead Channel Sensing Intrinsic Amplitude: 1.3 mV
Lead Channel Sensing Intrinsic Amplitude: 12 mV
Lead Channel Setting Pacing Amplitude: 2 V
Lead Channel Setting Pacing Amplitude: 2.5 V
Lead Channel Setting Pacing Pulse Width: 0.5 ms
Lead Channel Setting Sensing Sensitivity: 2 mV
Pulse Gen Model: 2272
Pulse Gen Serial Number: 3813093

## 2020-10-24 ENCOUNTER — Other Ambulatory Visit: Payer: Self-pay | Admitting: Internal Medicine

## 2020-10-26 ENCOUNTER — Other Ambulatory Visit: Payer: Self-pay | Admitting: Cardiology

## 2020-10-26 NOTE — Telephone Encounter (Signed)
Prescription refill request for Eliquis received.  Indication: afib Last office visit:  [place], 08/03/2020 Scr: 0.92, 06/02/2020 Age: 87 yo  Weight: 80.7 kg   Prescription refill sent.

## 2020-10-27 ENCOUNTER — Telehealth: Payer: Self-pay | Admitting: Cardiovascular Disease

## 2020-10-27 NOTE — Telephone Encounter (Signed)
Please call to discuss Kerri Mills. Patient states her insurance states they will not pay for Kerri Mills anymore. Please call to discuss. Patient states she has enough for 6 days.

## 2020-10-27 NOTE — Telephone Encounter (Signed)
Spoke with daughter, Kerri Mills (DPR approved) and stated pt was concern with her Eliquis being denied by insurance. Stated pt was at lunch and to call her a little after one. Was able to speak with [name], stated that [facility] was working to get her Eliquis approved for her and in the meantime we can provide her with samples to help cover any medication gaps. However, currently out of the 5mg  supply, message sent to drug rep for additional supplies. Advised pt will call when samples come and she may pick up at front desk. Pt verbalized understanding, otherwise nothing further.

## 2020-10-28 ENCOUNTER — Telehealth: Payer: Self-pay

## 2020-10-28 NOTE — Telephone Encounter (Signed)
Called pt to let her know Eliquis 5mg  samples came in and there are 2 boxes with 14, 5 mg pills in each. They will be at the front desk for her to pick up. Kerri Mills is grateful and will stop by to pick up her samples later today or tomorrow.

## 2020-10-30 ENCOUNTER — Telehealth: Payer: Self-pay | Admitting: Internal Medicine

## 2020-10-30 ENCOUNTER — Ambulatory Visit (INDEPENDENT_AMBULATORY_CARE_PROVIDER_SITE_OTHER): Payer: Medicare Other | Admitting: Pharmacist

## 2020-10-30 DIAGNOSIS — M170 Bilateral primary osteoarthritis of knee: Secondary | ICD-10-CM

## 2020-10-30 DIAGNOSIS — I4891 Unspecified atrial fibrillation: Secondary | ICD-10-CM | POA: Diagnosis not present

## 2020-10-30 DIAGNOSIS — K219 Gastro-esophageal reflux disease without esophagitis: Secondary | ICD-10-CM

## 2020-10-30 DIAGNOSIS — I10 Essential (primary) hypertension: Secondary | ICD-10-CM | POA: Diagnosis not present

## 2020-10-30 DIAGNOSIS — F439 Reaction to severe stress, unspecified: Secondary | ICD-10-CM

## 2020-10-30 NOTE — Patient Instructions (Addendum)
Kerri Mills,   It was great talking with you!  The cardiology team is working on the coverage for your Eliquis. [facility] with just the manufacture savings card information that they provided (that [facility] on file). Call me if you have any issues with that.   Below is a summary of everything we discussed. I have passed the message along to Dr. Nicki Reaper regarding the possibility of a neurology referral.   Take care!  Kerri Mills, PharmD [phone]    Visit Information  PATIENT GOALS:  Goals Addressed              This Visit's Progress     Patient Stated   .  Medication Monitoring (pt-stated)        Patient Goals/Self-Care Activities . Over the next 90 days, patient will:  - take medications as prescribed collaborate with provider on medication access solutions       Consent to CCM Services: Kerri Mills was given information about Chronic Care Management services today including:  1. CCM service includes personalized support from designated clinical staff supervised by her physician, including individualized plan of care and coordination with other care providers 2. 24/7 contact phone numbers for assistance for urgent and routine care needs. 3. Service will only be billed when office clinical staff spend 20 minutes or more in a month to coordinate care. 4. Only one practitioner may furnish and bill the service in a calendar month. 5. The patient may stop CCM services at any time (effective at the end of the month) by phone call to the office staff. 6. The patient will be responsible for cost sharing (co-pay) of up to 20% of the service fee (after annual deductible is met).  Patient agreed to services and verbal consent obtained.   The patient verbalized understanding of instructions, educational materials, and care plan provided today and declined offer to receive copy of patient instructions, educational materials, and care plan.   Medication Assistance:  Cardiology completing Eliquis appeal  Follow Up:  Patient agrees to Care Plan and Follow-up.  Plan: Telephone follow up appointment with care management team member scheduled for:  ~ 8 weeks  Kerri Mills, PharmD, [place], CPP Clinical Pharmacist [place] [facility]: Patient Care Plan: Medication Management    Problem Identified: Afib, Hypothyroidism, Tremors     Long-Range Goal: Medication Management   Start Date: 10/30/2020  This Visit's Progress: On track  Priority: High  Note:   Current Barriers:  . Unable to independently afford treatment regimen  Pharmacist Clinical Goal(s):  Marland Kitchen Over the next 90 days, patient will verbalize ability to afford treatment regimen . Over the next 90 days, patient will achieve adherence to monitoring guidelines and medication adherence to achieve therapeutic efficacy through collaboration with PharmD and provider.   Interventions: . 1:1 collaboration with Einar Pheasant, MD regarding development and update of comprehensive plan of care as evidenced by provider attestation and co-signature . Inter-disciplinary care team collaboration (see longitudinal plan of care) . Comprehensive medication review performed; medication list updated in electronic medical record  Atrial Fibrillation: . Appropriately managed; current rate control: metoprolol 25 mg BID; anticoagulant treatment: Eliquis 5 mg BID; furosemide 20 mg PRN worsened fluid retention (mostly noticed by dyspnea when laying in bed); follows w/ Dr. Rockey Situ. . Reports that she received notification from her insurance in December that they would no longer cover Eliquis. Reported to cardiology. They submitted Prior Authorization and submitted  Appeal. No decision on appeal yet. Patient has not tried to fill with just the $10 savings card  . Collaborated with cardiology to discuss cost concerns and status of Eliquis appeal. They will continue to  follow on this.  . Advised patient to discuss with the pharmacy about running the Eliquis through just the manufacturer card.  . Continue current regimen at this time along with cardiology follow up.   Mood, Chronic Pain, Tremors: . Uncontrolled per patient report. Current treatment: duloxetine 60 mg daily, gabapentin 600 mg QPM. Reports that for the past several weeks, her tremor has been worse, as well as a sensation of pain that goes up her neck and around her head. She reports that she has had this pain for years, but has recently worsened. Unsure what to do about it . Reports mood is moderately well controlled. Has recently realized she is more "uptight" and stressed about things. Is engaging in a therapy service through [employer] called "[facility]" that provides therapy services. Patient is looking forward to this.  . Discussed that referral to neurology may be appropriate. Will discuss w/ PCP Dr. Nicki Reaper.   Hyperlipidemia: . Controlled per last lab report; current regimen: lovastatin 40 mg daily . Continue current regimen at this time  Hypothyroidism: . Controlled per last lab work; current regimen: levothyroxine 100 mcg daily . Continue current regimen at this time  GERD: . Controlled per patient report; current regimen: omeprazole 20 mg daily  . Continue current regimen at this time, along with avoidance of trigger foods  SOB/Congestion: . Patient reports occasional congestion that results in cough. Reports she has to "carry a tissue everywhere" due to this post nasal drainage. No longer using Nasacort nasal spray.  . Discussed use of non sedating antihistamine. [name] asked about doTerra supplement with lavender, lemon, and peppermint. Reviewed Natural Meds data base, no significant potential interactions noted. Discussed that patient could try this. If no benefit, consider non-sedating antihistamine.   Patient Goals/Self-Care Activities . Over the next 90 days,  patient will:  - take medications as prescribed collaborate with provider on medication access solutions  Follow Up Plan: Telephone follow up appointment with care management team member scheduled for: ~ 8 weeks

## 2020-10-30 NOTE — Chronic Care Management (AMB) (Signed)
Chronic Care Management Pharmacy Note  10/30/2020 Name:  Kerri Mills MRN:  [id number] DOB:  [birth year]  Subjective: Kerri Mills is an 87 y.o. year old female who is a primary patient of Einar Pheasant, MD.  The CCM team was consulted for assistance with disease management and care coordination needs.    Engaged with patient by telephone for follow up visit in response to provider referral for pharmacy case management and/or care coordination services.   Consent to Services:  The patient was given the following information about Chronic Care Management services today, agreed to services, and gave verbal consent: 1. CCM service includes personalized support from designated clinical staff supervised by the primary care provider, including individualized plan of care and coordination with other care providers 2. 24/7 contact phone numbers for assistance for urgent and routine care needs. 3. Service will only be billed when office clinical staff spend 20 minutes or more in a month to coordinate care. 4. Only one practitioner may furnish and bill the service in a calendar month. 5.The patient may stop CCM services at any time (effective at the end of the month) by phone call to the office staff. 6. The patient will be responsible for cost sharing (co-pay) of up to 20% of the service fee (after annual deductible is met). Patient agreed to services and consent obtained.  Objective:  Lab Results  Component Value Date   CREATININE 0.92 06/02/2020   CREATININE 0.74 01/10/2020   CREATININE 0.97 01/09/2020    Lab Results  Component Value Date   HGBA1C 6.3 06/02/2020       Component Value Date/Time   CHOL 154 06/02/2020 0947   TRIG 146.0 06/02/2020 0947   HDL 38.40 (L) 06/02/2020 0947   CHOLHDL 4 06/02/2020 0947   VLDL 29.2 06/02/2020 0947   LDLCALC 86 06/02/2020 0947   LDLDIRECT 75.0 03/24/2017 1206   Lab Results  Component Value Date   TSH 1.24 07/07/2020      CH2DS2-VASc Score =  4  This indicates a 4.8% annual risk of stroke. The patient's score is based upon: CHF History: No HTN History: Yes Diabetes History: No Stroke History: No Vascular Disease History: No Age Score: 2 Gender Score: 1   BP Readings from Last 3 Encounters:  08/03/20 140/82  07/07/20 126/78  06/23/20 138/88    Assessment: Review of patient past medical history, allergies, medications, health status, including review of consultants reports, laboratory and other test data, was performed as part of comprehensive evaluation and provision of chronic care management services.   SDOH:  (Social Determinants of Health) assessments and interventions performed:  SDOH Interventions   Flowsheet Row Most Recent Value  SDOH Interventions   SDOH Interventions for the Following Domains Financial Strain  Financial Strain Interventions Other (Comment)  [collaborating with cardiology on medication access]      CCM Care Plan  Allergies  Allergen Reactions  . Atorvastatin Other (See Comments)    Other reaction(s): Other (See Comments) STIFFNESS AND SORE STIFFNESS AND SORE  . Lipitor [Atorvastatin Calcium] Other (See Comments)    Stiffness & soreness  . Penicillins Rash    REACTION: Unknown reaction    Medications Reviewed Today    Reviewed by De Hollingshead, RPH-CPP (Pharmacist) on 10/30/20 at 1354  Med List Status: <None>  Medication Order Taking? Sig Documenting Provider Last Dose Status Informant  albuterol (PROAIR HFA) 108 (90 Base) MCG/ACT inhaler [id number] No Inhale 2 puffs into the lungs every 6 (six)  hours as needed for wheezing or shortness of breath.  Patient not taking: Reported on 10/30/2020   Einar Pheasant, MD Not Taking Active Multiple Informants  bifidobacterium infantis (ALIGN) capsule [id number]  Take 1 capsule by mouth daily for 3 weeks. Einar Pheasant, MD  Active   Cholecalciferol (VITAMIN D) 1000 UNITS capsule [id number] Yes Take 1,000 Units by mouth daily. [provider] Taking Active Multiple Informants  DULoxetine (CYMBALTA) 60 MG capsule [id number] Yes TAKE 1 CAPSULE (60 MG TOTAL) BY MOUTH AT BEDTIME. Einar Pheasant, MD Taking Active   ELIQUIS 5 MG TABS tablet [id number] Yes TAKE 1 TABLET BY MOUTH TWICE A DAY Camnitz, Will Hassell Done, MD Taking Active   furosemide (LASIX) 20 MG tablet [id number] No Take 1 tablet (20 mg total) by mouth daily as needed. Take with potassium  Patient not taking: Reported on 10/30/2020   Minna Merritts, MD Not Taking Active   gabapentin (NEURONTIN) 300 MG capsule [id number] Yes TAKE 2 CAPSULES BY MOUTH AT BEDTIME Einar Pheasant, MD Taking Active   levothyroxine (SYNTHROID) 100 MCG tablet [id number] Yes Take 1 tablet (100 mcg total) by mouth daily. Einar Pheasant, MD Taking Active   lovastatin (MEVACOR) 40 MG tablet [id number] Yes TAKE 1 TABLET BY MOUTH EVERY DAY Crecencio Mc, MD Taking Active   metoprolol tartrate (LOPRESSOR) 25 MG tablet [id number] Yes TAKE 1 TABLET BY MOUTH TWICE A DAY Gollan, Kathlene November, MD Taking Active   Multiple Vitamins-Minerals (PRESERVISION AREDS 2) CAPS [id number] Yes Take 1 tablet by mouth 2 (two) times daily. [provider] Taking Active Multiple Informants  mupirocin ointment (BACTROBAN) 2 % [id number]  Apply to affected area bid Einar Pheasant, MD  Active   nystatin cream (MYCOSTATIN) [id number]  Apply 1 application topically 2 (two) times daily. Einar Pheasant, MD  Active   omeprazole (PRILOSEC) 20 MG capsule [id number] Yes TAKE 1 CAPSULE BY MOUTH ONCE DAILY Einar Pheasant, MD Taking Active   Polyethyl Glycol-Propyl Glycol (SYSTANE OP) [id number] Yes Place 1 drop into both eyes daily as needed (for dry eyes). [provider] Taking Active Multiple Informants  polyethylene glycol powder (GLYCOLAX/MIRALAX) 17 GM/SCOOP powder [id number]  Take 17 g by mouth daily. Einar Pheasant, MD  Active   potassium chloride (KLOR-CON) 10 MEQ tablet [id number] No Take 2 tablets (20 mEq total) by  mouth daily as needed. Take 2 tablets (20 mEq)  with Lasix  Patient not taking: Reported on 10/30/2020   Minna Merritts, MD Not Taking Active   scopolamine (TRANSDERM-SCOP, 1.5 MG,) 1 MG/3DAYS [id number] No Place 1 patch (1.5 mg total) onto the skin every 3 (three) days.  Patient not taking: Reported on 10/30/2020   Einar Pheasant, MD Not Taking Active   triamcinolone (NASACORT) 55 MCG/ACT AERO nasal inhaler [id number] No Place 2 sprays into the nose at bedtime.  Patient not taking: Reported on 10/30/2020   Einar Pheasant, MD Not Taking Active   White Petrolatum-Mineral Oil (GENTEAL TEARS NIGHT-TIME OP) [id number] Yes Place 1 application into both eyes at bedtime. Night time ointment 3.5g [provider] Taking Active Multiple Informants          Patient Active Problem List   Diagnosis Date Noted  . UTI (urinary tract infection) 09/21/2020  . Constipation 09/21/2020  . Back skin lesion 07/12/2020  . Vitamin D deficiency 07/12/2020  . Hypothyroid 07/07/2020  . Mucosal irritation of oral cavity 03/02/2020  . Cheek biting 03/02/2020  . Anxiety 03/02/2020  . LBBB (left bundle branch block) 01/11/2020  .  Symptomatic bradycardia 01/11/2020  . Tachy-brady syndrome ([place])   . Heart block 01/10/2020  . Syncope 01/08/2020  . Mucopurulent chronic bronchitis ([place]) 11/02/2019  . 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 Left hip pain 06/06/2019  . Encounter for completion of form with patient 03/03/2019  . Abnormal chest CT 12/22/2018  . SOB (shortness of breath) 11/25/2018  . Varicose veins of both lower extremities with inflammation 09/21/2018  . Swelling of lower extremity 08/13/2018  . Right knee pain 05/30/2018  . Fall 05/30/2018  . Sprain of knee 05/30/2018  . Abrasion, knee 05/30/2018  . Osteoarthritis of knee 05/30/2018  . Anemia 02/15/2018  . Bilateral knee pain 11/12/2017  . Atrial fibrillation with rapid ventricular response ([place])   . Demand ischemia ([place])   . Essential  hypertension   . Chest pain 09/29/2017  . Dermatitis 09/26/2017  . Osteoarthritis of both knees 06/12/2017  . Osteoporosis, senile 06/12/2017  . Right leg swelling 05/25/2017  . Joint ache 05/25/2017  . Hypercholesterolemia 02/17/2017  . Cough 01/23/2017  . Hyperglycemia 06/18/2016  . Abnormal mammogram of left breast 01/21/2015  . Knee pain, left 11/19/2014  . Diverticulosis of colon without hemorrhage 05/07/2014  . Diverticulosis of large intestine 05/07/2014  . History of breast cancer 01/20/2014  . Stress 06/15/2013  . Obstructive sleep apnea 06/15/2013  . Atrial fibrillation ([place]) 06/15/2013  . Posttraumatic hematoma of left breast 02/19/2013  . Lump or mass in breast 02/19/2013  . History of thyroid cancer 03/19/2011  . GERD (gastroesophageal reflux disease)   . GERD 05/26/2009    Conditions to be addressed/monitored: Atrial Fibrillation, HTN, HLD and Anxiety  Care Plan : Medication Management  Updates made by De Hollingshead, RPH-CPP since 10/30/2020 12:00 AM    Problem: Afib, Hypothyroidism, Tremors     Long-Range Goal: Medication Management   Start Date: 10/30/2020  This Visit's Progress: On track  Priority: High  Note:   Current Barriers:  . Unable to independently afford treatment regimen  Pharmacist Clinical Goal(s):  Marland Kitchen Over the next 90 days, patient will verbalize ability to afford treatment regimen . Over the next 90 days, patient will achieve adherence to monitoring guidelines and medication adherence to achieve therapeutic efficacy through collaboration with PharmD and provider.   Interventions: . 1:1 collaboration with Einar Pheasant, MD regarding development and update of comprehensive plan of care as evidenced by provider attestation and co-signature . Inter-disciplinary care team collaboration (see longitudinal plan of care) . Comprehensive medication review performed; medication list updated in electronic medical record  Atrial  Fibrillation: . Appropriately managed; current rate control: metoprolol 25 mg BID; anticoagulant treatment: Eliquis 5 mg BID; furosemide 20 mg PRN worsened fluid retention (mostly noticed by dyspnea when laying in bed); follows w/ Dr. Rockey Situ. . Reports that she received notification from her insurance in December that they would no longer cover Eliquis. Reported to cardiology. They submitted Prior Authorization and submitted Appeal. No decision on appeal yet. Patient has not tried to fill with just the $10 savings card  . Collaborated with cardiology to discuss cost concerns and status of Eliquis appeal. They will continue to follow on this.  . Advised patient to discuss with the pharmacy about running the Eliquis through just the manufacturer card.  . Continue current regimen at this time along with cardiology follow up.   Mood, Chronic Pain, Tremors: . Uncontrolled per patient report. Current treatment: duloxetine 60 mg daily, gabapentin 600 mg QPM. Reports that for the past several weeks, her tremor has  been worse, as well as a sensation of pain that goes up her neck and around her head. She reports that she has had this pain for years, but has recently worsened. Unsure what to do about it . Reports mood is moderately well controlled. Has recently realized she is more "uptight" and stressed about things. Is engaging in a therapy service through [employer] called "[facility]" that provides therapy services. Patient is looking forward to this.  . Discussed that referral to neurology may be appropriate. Will discuss w/ PCP Dr. Nicki Reaper.   Hyperlipidemia: . Controlled per last lab report; current regimen: lovastatin 40 mg daily . Continue current regimen at this time  Hypothyroidism: . Controlled per last lab work; current regimen: levothyroxine 100 mcg daily . Continue current regimen at this time  GERD: . Controlled per patient report; current regimen: omeprazole 20 mg daily  . Continue  current regimen at this time, along with avoidance of trigger foods  SOB/Congestion: . Patient reports occasional congestion that results in cough. Reports she has to "carry a tissue everywhere" due to this post nasal drainage. No longer using Nasacort nasal spray.  . Discussed use of non sedating antihistamine. [name] asked about doTerra supplement with lavender, lemon, and peppermint. Reviewed Natural Meds data base, no significant potential interactions noted. Discussed that patient could try this. If no benefit, consider non-sedating antihistamine.   Patient Goals/Self-Care Activities . Over the next 90 days, patient will:  - take medications as prescribed collaborate with provider on medication access solutions  Follow Up Plan: Telephone follow up appointment with care management team member scheduled for: ~ 8 weeks      Medication Assistance: Cardiology completing Eliquis appeal  Follow Up:  Patient agrees to Care Plan and Follow-up.  Plan: Telephone follow up appointment with care management team member scheduled for:  ~ 8 weeks  [name], PharmD, [place], [place] Clinical Pharmacist [employer] at [employer] [phone]

## 2020-10-30 NOTE — Telephone Encounter (Signed)
Received note from [name] that she was having issues with tremors, etc.  They had discussed possible neurology referral.  Is she interested in referral.  If so, let me know and I can place order for referral.

## 2020-11-02 DIAGNOSIS — H353114 Nonexudative age-related macular degeneration, right eye, advanced atrophic with subfoveal involvement: Secondary | ICD-10-CM | POA: Diagnosis not present

## 2020-11-02 DIAGNOSIS — H353221 Exudative age-related macular degeneration, left eye, with active choroidal neovascularization: Secondary | ICD-10-CM | POA: Diagnosis not present

## 2020-11-02 NOTE — Progress Notes (Signed)
Remote pacemaker transmission.   

## 2020-11-02 NOTE — Telephone Encounter (Signed)
Left detailed message for patient.

## 2020-11-03 ENCOUNTER — Other Ambulatory Visit: Payer: Self-pay

## 2020-11-03 ENCOUNTER — Ambulatory Visit
Admission: RE | Admit: 2020-11-03 | Discharge: 2020-11-03 | Disposition: A | Discharge status: Home / Self Care | Payer: Medicare Other | Source: Ambulatory Visit | Attending: Internal Medicine | Admitting: Internal Medicine

## 2020-11-03 DIAGNOSIS — R918 Other nonspecific abnormal finding of lung field: Secondary | ICD-10-CM | POA: Insufficient documentation

## 2020-11-03 DIAGNOSIS — Z85850 Personal history of malignant neoplasm of thyroid: Secondary | ICD-10-CM | POA: Diagnosis not present

## 2020-11-03 DIAGNOSIS — Z85820 Personal history of malignant melanoma of skin: Secondary | ICD-10-CM | POA: Diagnosis not present

## 2020-11-03 DIAGNOSIS — Z853 Personal history of malignant neoplasm of breast: Secondary | ICD-10-CM | POA: Diagnosis not present

## 2020-11-03 DIAGNOSIS — E039 Hypothyroidism, unspecified: Secondary | ICD-10-CM | POA: Diagnosis not present

## 2020-11-10 DIAGNOSIS — G4733 Obstructive sleep apnea (adult) (pediatric): Secondary | ICD-10-CM | POA: Diagnosis not present

## 2020-11-10 DIAGNOSIS — R0602 Shortness of breath: Secondary | ICD-10-CM | POA: Diagnosis not present

## 2020-11-10 DIAGNOSIS — J479 Bronchiectasis, uncomplicated: Secondary | ICD-10-CM | POA: Diagnosis not present

## 2020-11-10 DIAGNOSIS — J410 Simple chronic bronchitis: Secondary | ICD-10-CM | POA: Diagnosis not present

## 2020-11-13 ENCOUNTER — Telehealth: Payer: Self-pay | Admitting: Pharmacist

## 2020-11-13 NOTE — Telephone Encounter (Signed)
Called pt to advise her that Eliquis has been added back to her insurance's formulary due to overwhelming demand from provider's offices. Her pharmacy can process Eliquis like normal the next time she is due for a refill. She was appreciative for assistance.

## 2020-11-13 NOTE — Telephone Encounter (Signed)
Called pharmacy back, they stated copay card is working at the pharmacy now. Called pt to let her know, she was very appreciative for assistance.

## 2020-11-13 NOTE — Telephone Encounter (Signed)
Pharmacy did not apply copay card info that they were already provided with. Called pharmacy who stated they still have the card on file that has worked in the past but now the card states "nonmatched cardholder ID." Reactivated card online, called pharmacy back who stated it still was not working. Will call back in another hour to see if reactivated card has updated in their system.

## 2020-11-13 NOTE — Telephone Encounter (Signed)
Pt c/o medication issue:  1. Name of Medication:   ELIQUIS 5 MG TABS tablet    2. How are you currently taking this medication (dosage and times per day)? As precribed  3. Are you having a reaction (difficulty breathing--STAT)? No   4. What is your medication issue?   Patient is following up regarding Eliquis renewal. She states [facility] and she was advised that the medication would cost around $101 for a 90 day  Supply. She states last year she used a coupon that made her 90 day supply on $30. Last year's coupon can not be honored because it expired and she would like to know if we are able to provide a new coupon. Please advise.

## 2020-11-16 ENCOUNTER — Other Ambulatory Visit: Payer: Self-pay | Admitting: Internal Medicine

## 2020-11-16 DIAGNOSIS — Z1231 Encounter for screening mammogram for malignant neoplasm of breast: Secondary | ICD-10-CM

## 2020-11-20 ENCOUNTER — Telehealth: Payer: Self-pay | Admitting: Internal Medicine

## 2020-11-20 MED ORDER — DULOXETINE HCL 60 MG PO CPEP: 60.0000 mg | ORAL_CAPSULE | Freq: Every day | ORAL | 1 refills | Status: DC

## 2020-11-20 NOTE — Telephone Encounter (Signed)
Patient is now using [facility] and would like a refill of her DULoxetine (CYMBALTA) 60 MG capsule sent over to [facility].

## 2020-11-24 ENCOUNTER — Telehealth (INDEPENDENT_AMBULATORY_CARE_PROVIDER_SITE_OTHER): Payer: Self-pay | Admitting: Vascular Surgery

## 2020-11-24 NOTE — Telephone Encounter (Signed)
I spoke with [name] NP and she recommended for the patient to continue using the cortisone cream and also to take Claritin to help with relief. I left a detailed message on the patient voicemail with medical recommendations.

## 2020-11-24 NOTE — Telephone Encounter (Signed)
Patient left VM stating her left leg is extremely itchy and wants guidance if there is anything she can go to alleviate.

## 2020-11-25 DIAGNOSIS — J410 Simple chronic bronchitis: Secondary | ICD-10-CM | POA: Diagnosis not present

## 2020-11-25 DIAGNOSIS — J479 Bronchiectasis, uncomplicated: Secondary | ICD-10-CM | POA: Diagnosis not present

## 2020-11-25 DIAGNOSIS — G4733 Obstructive sleep apnea (adult) (pediatric): Secondary | ICD-10-CM | POA: Diagnosis not present

## 2020-12-08 DIAGNOSIS — R0602 Shortness of breath: Secondary | ICD-10-CM | POA: Diagnosis not present

## 2020-12-08 DIAGNOSIS — J410 Simple chronic bronchitis: Secondary | ICD-10-CM | POA: Diagnosis not present

## 2020-12-08 DIAGNOSIS — G4733 Obstructive sleep apnea (adult) (pediatric): Secondary | ICD-10-CM | POA: Diagnosis not present

## 2020-12-08 DIAGNOSIS — J479 Bronchiectasis, uncomplicated: Secondary | ICD-10-CM | POA: Diagnosis not present

## 2020-12-09 ENCOUNTER — Other Ambulatory Visit: Payer: Self-pay | Admitting: Internal Medicine

## 2020-12-11 ENCOUNTER — Telehealth: Payer: Self-pay | Admitting: Internal Medicine

## 2020-12-11 NOTE — Telephone Encounter (Signed)
Called and spoke to patient, who is requesting CT results 11/03/2020.  Dr. Mortimer Fries, please advise. Thanks

## 2020-12-18 NOTE — Telephone Encounter (Signed)
Dr. Kasa, please advise. Thanks °

## 2020-12-21 ENCOUNTER — Other Ambulatory Visit: Payer: Self-pay

## 2020-12-21 ENCOUNTER — Ambulatory Visit
Admission: RE | Admit: 2020-12-21 | Discharge: 2020-12-21 | Disposition: A | Discharge status: Home / Self Care | Payer: Medicare Other | Source: Ambulatory Visit | Attending: Internal Medicine | Admitting: Internal Medicine

## 2020-12-21 ENCOUNTER — Telehealth: Payer: Self-pay

## 2020-12-21 DIAGNOSIS — Z1231 Encounter for screening mammogram for malignant neoplasm of breast: Secondary | ICD-10-CM | POA: Insufficient documentation

## 2020-12-21 NOTE — Telephone Encounter (Signed)
Lm for patient.  

## 2020-12-21 NOTE — Telephone Encounter (Signed)
Patient is aware of results and voiced his understanding.  appt scheduled for 02/03/2020 at 8:30. Nothing further is needed at this time.

## 2020-12-21 NOTE — Telephone Encounter (Signed)
Hi Margie, patient will need to be seen by me or Dr Duwayne Heck, we need to evaluate patient for possible bronch to assess for infections

## 2020-12-21 NOTE — Chronic Care Management (AMB) (Signed)
  Care Management   Note  12/21/2020 Name: Kerri Mills MRN: [id number] DOB: Dec 30, 1930  Kerri Mills is a 87 y.o. year old female who is a primary care patient of Einar Pheasant, MD and is actively engaged with the care management team. I reached out to [name] by phone today to assist with re-scheduling a follow up visit with the Pharmacist  Follow up plan: Unsuccessful telephone outreach attempt made. A HIPAA compliant phone message was left for the patient providing contact information and requesting a return call.  The care management team will reach out to the patient again over the next 3 days.  If patient returns call to provider office, please advise to call [name]  at [place], [address], [place], [place] [address] Direct Dial: [phone] [email or web address]: [email or web address]

## 2020-12-21 NOTE — Chronic Care Management (AMB) (Signed)
  Care Management   Note  12/21/2020 Name: Kerri Mills MRN: [id number] DOB: [birth year]  Kerri Mills is a 87 y.o. year old female who is a primary care patient of Einar Pheasant, MD and is actively engaged with the care management team. I reached out to [name] by phone today to assist with re-scheduling a follow up visit with the Pharmacist  Follow up plan: Telephone appointment with care management team member scheduled for:01/01/2021  Noreene Larsson, [address], [place], [place] [address] Direct Dial: [phone] [email or web address]: [email or web address]

## 2020-12-21 NOTE — Telephone Encounter (Signed)
I have reviewed her CT.  Looks like she has MAI which is very mild.  I can see her, it does not need to be urgent.

## 2020-12-21 NOTE — Telephone Encounter (Signed)
Dr. Patsey Berthold, please advise where patient can be scheduled?

## 2020-12-29 ENCOUNTER — Telehealth: Payer: Medicare Other

## 2020-12-30 ENCOUNTER — Other Ambulatory Visit: Payer: Self-pay | Admitting: Internal Medicine

## 2021-01-01 ENCOUNTER — Ambulatory Visit (INDEPENDENT_AMBULATORY_CARE_PROVIDER_SITE_OTHER): Payer: Medicare Other | Admitting: Pharmacist

## 2021-01-01 DIAGNOSIS — E039 Hypothyroidism, unspecified: Secondary | ICD-10-CM | POA: Diagnosis not present

## 2021-01-01 DIAGNOSIS — E7800 Pure hypercholesterolemia, unspecified: Secondary | ICD-10-CM

## 2021-01-01 DIAGNOSIS — I10 Essential (primary) hypertension: Secondary | ICD-10-CM

## 2021-01-01 DIAGNOSIS — F439 Reaction to severe stress, unspecified: Secondary | ICD-10-CM

## 2021-01-01 DIAGNOSIS — M170 Bilateral primary osteoarthritis of knee: Secondary | ICD-10-CM | POA: Diagnosis not present

## 2021-01-01 DIAGNOSIS — I4891 Unspecified atrial fibrillation: Secondary | ICD-10-CM

## 2021-01-01 NOTE — Chronic Care Management (AMB) (Signed)
Chronic Care Management Pharmacy Note  01/01/2021 Name:  Kerri Mills MRN:  [id number] DOB:  08-07-1931  Subjective: Kerri Mills is an 87 y.o. year old female who is a primary patient of Einar Pheasant, MD.  The CCM team was consulted for assistance with disease management and care coordination needs.    Engaged with patient by telephone for follow up visit in response to provider referral for pharmacy case management and/or care coordination services.   Consent to Services:  The patient was given information about Chronic Care Management services, agreed to services, and gave verbal consent prior to initiation of services.  Please see initial visit note for detailed documentation.   Patient Care Team: Einar Pheasant, MD as PCP - General (Internal Medicine) Minna Merritts, MD as PCP - Cardiology (Cardiology) Constance Haw, MD as PCP - Electrophysiology (Cardiology) Bary Castilla Forest Gleason, MD (General Surgery) Rocco Serene, MD (Internal Medicine) De Hollingshead, RPH-CPP (Pharmacist) De Hollingshead, RPH-CPP (Pharmacist)  Recent office visits: None since our last call  Recent consult visits: None since our last call  Hospital visits: None in previous 6 months  Objective:  Lab Results  Component Value Date   CREATININE 0.92 06/02/2020   CREATININE 0.74 01/10/2020   CREATININE 0.97 01/09/2020    Lab Results  Component Value Date   HGBA1C 6.3 06/02/2020   Last diabetic Eye exam: No results found for: HMDIABEYEEXA  Last diabetic Foot exam: No results found for: HMDIABFOOTEX      Component Value Date/Time   CHOL 154 06/02/2020 0947   TRIG 146.0 06/02/2020 0947   HDL 38.40 (L) 06/02/2020 0947   CHOLHDL 4 06/02/2020 0947   VLDL 29.2 06/02/2020 0947   LDLCALC 86 06/02/2020 0947   LDLDIRECT 75.0 03/24/2017 1206    Hepatic Function Latest Ref Rng & Units 06/02/2020 06/02/2020 01/08/2020  Total Protein 6.0 - 8.3 g/dL 6.4 6.4 6.6  Albumin 3.5 - 5.2 g/dL  4.0 4.0 3.8  AST 0 - 37 U/L _0 ALT 0 - 35 U/L _1 Alk Phosphatase 39 - 117 U/L 130(H) 130(H) 111  Total Bilirubin 0.2 - 1.2 mg/dL 0.4 0.4 0.9  Bilirubin, Direct 0.0 - 0.3 mg/dL - 0.1 -    Lab Results  Component Value Date/Time   TSH 1.24 07/07/2020 12:33 PM   TSH 4.89 (H) 06/02/2020 09:47 AM    CBC Latest Ref Rng & Units 01/10/2020 01/09/2020 01/08/2020  WBC 4.0 - 10.5 K/uL 6.7 6.6 6.0  Hemoglobin 12.0 - 15.0 g/dL 13.0 12.2 12.7  Hematocrit 36.0 - 46.0 % 40.6 37.7 39.8  Platelets 150 - 400 K/uL 210 202 206    Lab Results  Component Value Date/Time   VD25OH 28.49 (L) 07/19/2018 11:27 AM    Clinical ASCVD: No    Social History   Tobacco Use  Smoking Status Never Smoker  Smokeless Tobacco Never Used   BP Readings from Last 3 Encounters:  08/03/20 140/82  07/07/20 126/78  06/23/20 138/88   Pulse Readings from Last 3 Encounters:  08/03/20 78  07/07/20 68  06/23/20 62   Wt Readings from Last 3 Encounters:  10/09/20 178 lb (80.7 kg)  09/24/20 178 lb (80.7 kg)  09/10/20 178 lb (80.7 kg)    Assessment: Review of patient past medical history, allergies, medications, health status, including review of consultants reports, laboratory and other test data, was performed as part of comprehensive evaluation and provision of chronic care management  services.   SDOH:  (Social Determinants of Health) assessments and interventions performed:  SDOH Interventions   Flowsheet Row Most Recent Value  SDOH Interventions   SDOH Interventions for the Following Domains Housing  Housing Interventions Intervention Not Indicated      CCM Care Plan  Allergies  Allergen Reactions  . Atorvastatin Other (See Comments)    Other reaction(s): Other (See Comments) STIFFNESS AND SORE STIFFNESS AND SORE  . Lipitor [Atorvastatin Calcium] Other (See Comments)    Stiffness & soreness  . Penicillins Rash    REACTION: Unknown reaction    Medications Reviewed Today    Reviewed  by De Hollingshead, RPH-CPP (Pharmacist) on 01/01/21 at 1503  Med List Status: <None>  Medication Order Taking? Sig Documenting Provider Last Dose Status Informant  albuterol (PROAIR HFA) 108 (90 Base) MCG/ACT inhaler [id number] No Inhale 2 puffs into the lungs every 6 (six) hours as needed for wheezing or shortness of breath.  Patient not taking: No sig reported   Einar Pheasant, MD Not Taking Active   bifidobacterium infantis (ALIGN) capsule [id number] No Take 1 capsule by mouth daily for 3 weeks.  Patient not taking: Reported on 01/01/2021   Einar Pheasant, MD Not Taking Active   Cholecalciferol (VITAMIN D) 1000 UNITS capsule [id number] Yes Take 1,000 Units by mouth daily. [provider] Taking Active            Med Note Darnelle Maffucci, Arville Lime   Fri Jan 01, 2021  2:57 PM) Taking every other day  DULoxetine (CYMBALTA) 60 MG capsule [id number] Yes Take 1 capsule (60 mg total) by mouth at bedtime. Einar Pheasant, MD Taking Active   ELIQUIS 5 MG TABS tablet [id number] Yes TAKE 1 TABLET BY MOUTH TWICE A DAY Camnitz, Will Hassell Done, MD Taking Active   furosemide (LASIX) 20 MG tablet [id number] Yes Take 1 tablet (20 mg total) by mouth daily as needed. Take with potassium Minna Merritts, MD Taking Active            Med Note Darnelle Maffucci, Arville Lime   Fri Jan 01, 2021  2:58 PM) Taking 2-3 times weekly  gabapentin (NEURONTIN) 300 MG capsule [id number] Yes TAKE 2 CAPSULES BY MOUTH AT BEDTIME Einar Pheasant, MD Taking Active   levothyroxine (SYNTHROID) 100 MCG tablet [id number] Yes TAKE 1 TABLET EVERY DAY ON EMPTY STOMACHWITH A GLASS OF WATER AT LEAST 30-60 MINBEFORE Laveda Norman, MD Taking Active   lovastatin (MEVACOR) 40 MG tablet [id number] Yes TAKE 1 TABLET BY MOUTH EVERY DAY Crecencio Mc, MD Taking Active   metoprolol tartrate (LOPRESSOR) 25 MG tablet [id number] Yes TAKE 1 TABLET BY MOUTH TWICE A DAY Gollan, Kathlene November, MD Taking Active   Multiple Vitamins-Minerals (PRESERVISION AREDS 2)  CAPS [id number] Yes Take 1 tablet by mouth 2 (two) times daily. [provider] Taking Active Multiple Informants  mupirocin ointment (BACTROBAN) 2 % [id number] No Apply to affected area bid  Patient not taking: Reported on 01/01/2021   Einar Pheasant, MD Not Taking Active   nystatin cream (MYCOSTATIN) [id number] No Apply 1 application topically 2 (two) times daily.  Patient not taking: Reported on 01/01/2021   Einar Pheasant, MD Not Taking Active   omeprazole (PRILOSEC) 20 MG capsule [id number] Yes TAKE 1 CAPSULE BY MOUTH ONCE DAILY Einar Pheasant, MD Taking Active   Polyethyl Glycol-Propyl Glycol (SYSTANE OP) [id number] Yes Place 1 drop into both eyes daily as needed (for dry eyes). [provider] Taking Active Multiple Informants  polyethylene glycol powder (  GLYCOLAX/MIRALAX) 17 GM/SCOOP powder [id number]  Take 17 g by mouth daily. Einar Pheasant, MD  Active   potassium chloride (KLOR-CON) 10 MEQ tablet [id number] Yes Take 2 tablets (20 mEq total) by mouth daily as needed. Take 2 tablets (20 mEq)  with Lasix  Patient taking differently: Take 20 mEq by mouth daily as needed. Take 2 tablets (20 mEq)  with Lasix   Rockey Situ, Kathlene November, MD Taking Active   scopolamine (TRANSDERM-SCOP, 1.5 MG,) 1 MG/3DAYS [id number] No Place 1 patch (1.5 mg total) onto the skin every 3 (three) days.  Patient not taking: No sig reported   Einar Pheasant, MD Not Taking Active   triamcinolone (NASACORT) 55 MCG/ACT AERO nasal inhaler [id number] No Place 2 sprays into the nose at bedtime.  Patient not taking: No sig reported   Einar Pheasant, MD Not Taking Active   White Petrolatum-Mineral Oil (GENTEAL TEARS NIGHT-TIME OP) [id number] Yes Place 1 application into both eyes at bedtime. Night time ointment 3.5g [provider] Taking Active Multiple Informants          Patient Active Problem List   Diagnosis Date Noted  . UTI (urinary tract infection) 09/21/2020  . Constipation 09/21/2020  . Back  skin lesion 07/12/2020  . Vitamin D deficiency 07/12/2020  . Hypothyroid 07/07/2020  . Mucosal irritation of oral cavity 03/02/2020  . Cheek biting 03/02/2020  . Anxiety 03/02/2020  . LBBB (left bundle branch block) 01/11/2020  . Symptomatic bradycardia 01/11/2020  . Tachy-brady syndrome ([place])   . Heart block 01/10/2020  . Syncope 01/08/2020  . Mucopurulent chronic bronchitis ([place]) 11/02/2019  . 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 Left hip pain 06/06/2019  . Encounter for completion of form with patient 03/03/2019  . Abnormal chest CT 12/22/2018  . SOB (shortness of breath) 11/25/2018  . Varicose veins of both lower extremities with inflammation 09/21/2018  . Swelling of lower extremity 08/13/2018  . Right knee pain 05/30/2018  . Fall 05/30/2018  . Sprain of knee 05/30/2018  . Abrasion, knee 05/30/2018  . Osteoarthritis of knee 05/30/2018  . Anemia 02/15/2018  . Bilateral knee pain 11/12/2017  . Atrial fibrillation with rapid ventricular response ([place])   . Demand ischemia ([place])   . Essential hypertension   . Chest pain 09/29/2017  . Dermatitis 09/26/2017  . Osteoarthritis of both knees 06/12/2017  . Osteoporosis, senile 06/12/2017  . Right leg swelling 05/25/2017  . Joint ache 05/25/2017  . Hypercholesterolemia 02/17/2017  . Cough 01/23/2017  . Hyperglycemia 06/18/2016  . Abnormal mammogram of left breast 01/21/2015  . Knee pain, left 11/19/2014  . Diverticulosis of colon without hemorrhage 05/07/2014  . Diverticulosis of large intestine 05/07/2014  . History of breast cancer 01/20/2014  . Stress 06/15/2013  . Obstructive sleep apnea 06/15/2013  . Atrial fibrillation ([place]) 06/15/2013  . Posttraumatic hematoma of left breast 02/19/2013  . Lump or mass in breast 02/19/2013  . History of thyroid cancer 03/19/2011  . GERD (gastroesophageal reflux disease)   . GERD 05/26/2009    Immunization History  Administered Date(s) Administered  . Fluad Quad(high Dose 65+) 06/06/2019,  07/07/2020  . Influenza Split 07/18/2014  . Influenza, High Dose Seasonal PF 07/01/2015, 06/13/2016, 05/23/2017, 06/25/2018  . Influenza,inj,Quad PF,6+ Mos 06/11/2013  . Moderna Sars-Covid-2 Vaccination 10/08/2019, 11/05/2019, 07/24/2020  . Pneumococcal Conjugate-13 12/18/2013  . Pneumococcal Polysaccharide-23 07/01/2015  . Tdap 11/15/2016  . Zoster Recombinat (Shingrix) 06/25/2018, 07/13/2018, 11/07/2018    Conditions to be addressed/monitored: Atrial Fibrillation, HTN and HLD  Care Plan :  Medication Management  Updates made by De Hollingshead, RPH-CPP since 01/01/2021 12:00 AM    Problem: Afib, Hypothyroidism, Tremors     Long-Range Goal: Medication Management   Start Date: 10/30/2020  This Visit's Progress: On track  Recent Progress: On track  Priority: High  Note:   Current Barriers:  . Unable to independently afford treatment regimen  Pharmacist Clinical Goal(s):  Marland Kitchen Over the next 90 days, patient will verbalize ability to afford treatment regimen . Over the next 90 days, patient will achieve adherence to monitoring guidelines and medication adherence to achieve therapeutic efficacy through collaboration with PharmD and provider.   Interventions: . 1:1 collaboration with Einar Pheasant, MD regarding development and update of comprehensive plan of care as evidenced by provider attestation and co-signature . Inter-disciplinary care team collaboration (see longitudinal plan of care) . Comprehensive medication review performed; medication list updated in electronic medical record  Health Maintenance: . Lives in [place]. Has a dog, Precious. Walks BID. Lots of friends. . Due for follow up w/ PCP. Will collaborate w/ office staff to outreach patient to schedule  Atrial Fibrillation: . Appropriately managed; current rate control: metoprolol 25 mg BID; anticoagulant treatment: Eliquis 5 mg BID; furosemide 20 mg PRN worsened fluid retention w/ potassium 20 mEq - takes these  ~ 2-3 times weekly; follows w/ Dr. Rockey Situ. . Eliquis is now covered by [facility]. W/ commercial insurance copay card copay is $10 per month.  . Continue current regimen at this time along with collaboration with cardiology.   Mood, Chronic Pain, Tremors: . Reports mood is moderately well controlled. Current treatment: duloxetine 60 mg daily, gabapentin 600 mg QPM. . Notes numbing sensation on side of neck, occasional tremors still. PCP had outreached her to discuss, she did not return call. Would like to discuss w/ Dr. Nicki Reaper at f/u. Marland Kitchen Reports how much she loves the support of her living community. Helps support her mental health.  . Continue current regimen at this time along w/ f/u with PCP  Hyperlipidemia: . Controlled per last lab report; current regimen: lovastatin 40 mg daily . Continue current regimen at this time  Hypothyroidism: . Controlled per last lab work; current regimen: levothyroxine 100 mcg daily . Continue current regimen at this time  GERD: . Controlled per patient report; current regimen: omeprazole 20 mg daily  . Continue current regimen at this time, along with avoidance of trigger foods  SOB/Congestion: . Patient reports that her allergies have been worse this past season. Occasional use of OTC Allergy Relief (upon review, realizes this is Benadryl), OTC Mucinex, occasional albuterol HFA PRN . Patient reports SOB when walking her dog and her dog walks faster. Wonders if she could do something about this . Upcoming appointment w/ Dr. Patsey Berthold pulmonology to f/u on results of CT. Encouraged to discuss SOB w/ Dr. Patsey Berthold. Encouraged her to try taking albuterol prior to taking her dog on a walk. She notes she will try this.  . Dicussed to avoid benadryl, choose non-sedating antihistamine (generic Zyrtec, Allegra, Claritin) rather than anything with diphenhydramine. She verbalizes understanding.  Supplements: Vitamin D 1000 units every other day - reduced from daily since  she has been out in the sun more often. Follow Vitamin D level. MVI, daily Align probiotic.  Patient Goals/Self-Care Activities . Over the next 90 days, patient will:  - take medications as prescribed collaborate with provider on medication access solutions  Follow Up Plan: Telephone follow up appointment with care management team member scheduled for: ~  10 weeks      Medication Assistance: None required.  Patient affirms current coverage meets needs.  Patient's preferred pharmacy is:  [facility] - [place], [place] - [address] [place] [address] Phone: [phone] Fax: [phone]  [id number] [id number]: [phone]: [phone] Follow Up:  Patient agrees to Care Plan and Follow-up.  Plan: Telephone follow up appointment with care management team member scheduled for:  ~ 12 weeks  [name] TDarnelle Maffucci PharmD, BNealmont CSister BayClinical Pharmacist LOccidental Petroleumat BJohnson & [phone]

## 2021-01-01 NOTE — Patient Instructions (Signed)
Visit Information  PATIENT GOALS:  Goals Addressed               This Visit's Progress     Patient Stated     Medication Monitoring (pt-stated)        Patient Goals/Self-Care Activities Over the next 90 days, patient will:  - take medications as prescribed - collaborate with provider on medication access solutions.          Patient verbalizes understanding of instructions provided today and agrees to view in MyChart.   Plan: Telephone follow up appointment with care management team member scheduled for:  ~ 12 weeks  [name] Mynor Witkop, PharmD, BCACP, CPP Clinical Pharmacist [facility] [place] [phone] 

## 2021-01-08 DIAGNOSIS — R0602 Shortness of breath: Secondary | ICD-10-CM | POA: Diagnosis not present

## 2021-01-08 DIAGNOSIS — G4733 Obstructive sleep apnea (adult) (pediatric): Secondary | ICD-10-CM | POA: Diagnosis not present

## 2021-01-08 DIAGNOSIS — J410 Simple chronic bronchitis: Secondary | ICD-10-CM | POA: Diagnosis not present

## 2021-01-08 DIAGNOSIS — J479 Bronchiectasis, uncomplicated: Secondary | ICD-10-CM | POA: Diagnosis not present

## 2021-01-15 ENCOUNTER — Telehealth: Payer: Self-pay | Admitting: Internal Medicine

## 2021-01-15 ENCOUNTER — Other Ambulatory Visit: Payer: Self-pay | Admitting: Cardiovascular Disease

## 2021-01-15 ENCOUNTER — Telehealth (INDEPENDENT_AMBULATORY_CARE_PROVIDER_SITE_OTHER): Payer: Medicare Other | Admitting: Internal Medicine

## 2021-01-15 DIAGNOSIS — M542 Cervicalgia: Secondary | ICD-10-CM | POA: Diagnosis not present

## 2021-01-15 DIAGNOSIS — I10 Essential (primary) hypertension: Secondary | ICD-10-CM

## 2021-01-15 NOTE — Telephone Encounter (Signed)
Transfer to Access Nurse PT called in to inform that she is having stiffness in neck with a numbness and tingling feeling. She does not remember when it started.

## 2021-01-15 NOTE — Telephone Encounter (Signed)
Spoke with pt and she stated that she does not need to go to the ED or to urgent care. She stated that the neck stiffness has been going on for a while and has received therapy for it before. Pt stated that the stiffness has just gotten worse and is causing some numbness and tingling in her neck and head that has been going on for a while, she just thought that it would go away or at least get better. I have scheduled pt for a virtual visit with you today at 3pm.

## 2021-01-15 NOTE — Telephone Encounter (Signed)
Access Health Nurse called to ask for a callback to Patient in regards to the stiffness tingling numbness in neck. PT declined contacting 911.

## 2021-01-15 NOTE — Telephone Encounter (Signed)
See office note

## 2021-01-15 NOTE — Progress Notes (Signed)
Patient ID: Kerri Mills, female   DOB: 05/11/1931, 87 y.o.   MRN: [id number]   Virtual Visit via video Note  This visit type was conducted due to national recommendations for restrictions regarding the COVID-19 pandemic (e.g. social distancing).  This format is felt to be most appropriate for this patient at this time.  All issues noted in this document were discussed and addressed.  No physical exam was performed (except for noted visual exam findings with Video Visits).   I connected with Kerri Mills by a video enabled telemedicine application and verified that I am speaking with the correct person using two identifiers. Location patient: home Location provider:  home office Persons participating in the virtual visit: patient, provider  The limitations, risks, security and privacy concerns of performing an evaluation and management service by video and the availability of in person appointments have been discussed.  It has also been discussed with the patient that there may be a patient responsible charge related to this service. The patient expressed understanding and agreed to proceed.   Reason for visit: work in appt  HPI: Work in with concerns regarding neck pain.  On questioning her, she reports she has had neck tightness for years (even in her 95s).  Reports increased neck tightness and pain recently.  Will massage her neck.  Has noticed recently a numb feeling extending up her head - described as a tingling sensation.  States notices discomfort and tingling - neck and extends up the back of her ear.  Notices when looks to the left - increased pain and limited rom.  Also notices a pulling sensation when looking to the right.  No dizziness.  No motor weakness.  Eating and drinking ok.  No chest pain or sob.  Will occasionally take a tylenol.  Helps some when takes, but she rarely takes.     ROS: See pertinent positives and negatives per HPI.  Past Medical History:  Diagnosis Date  .  Allergy   . Anxiety   . Arthritis   . Atypical chest pain    a. 10/2018 MV: small, fixed apical defect possibly 2/2 attenuation artifact. No ischemia.  EF 68%.  . Clotting disorder ([place])   . Colitis   . Depression   . Diverticulitis 2013  . Gastric ulcer   . GERD (gastroesophageal reflux disease)   . History of echocardiogram    a. 09/2019 Echo: EF 55-60%, mod LVH. Mildly dil LA. Triv MR/TR.  Marland Kitchen Hypercholesterolemia   . Hypertension   . Infiltrating lobular carcinoma of left breast 2011   T2,N0, ER: 90%; PR 0%; Her 2 neu not amplified. [facility]).  . Melanoma ([place]) 1997  . Melanoma in situ of upper extremity ([place]) 03/19/2011  . Persistent atrial fibrillation ([place])    a. CHADS2VASc => 4 (HTN, age x 2, female)  . Personal history of radiation therapy 2011   BREAST CA  . Seroma    HISTORY OF LFT BREAST  . Sleep apnea   . Thyroid cancer ([place]) 1992    Past Surgical History:  Procedure Laterality Date  . ABDOMINAL HYSTERECTOMY  1973   partial  . BREAST BIOPSY Left 02-13-13   BENIGN BREAST TISSUE WITH CHANGES CONSISTENT WITH FAT NECROSIS  . BREAST BIOPSY Left 01/21/2015   bx done in [facility] office 11:00 left 6-8cmfn  . BREAST EXCISIONAL BIOPSY Left 1995   neg  . BREAST EXCISIONAL BIOPSY Left 2011   Breast cancer radiation  . BREAST LUMPECTOMY  Left 2011   BREAST CA  . CARDIAC CATHETERIZATION    . CHOLECYSTECTOMY    . COLONOSCOPY  2013  . MELANOMA EXCISION     RT UPPER ARM  . PACEMAKER IMPLANT N/A 01/10/2020   Procedure: PACEMAKER IMPLANT;  Surgeon: Constance Haw, MD;  Location: [facility];  Service: Cardiovascular;  Laterality: N/A;  . PARTIAL HYSTERECTOMY     bleeding, ovaries in place.    . THYROID SURGERY  1992   FOR THYROID CANCER  . TONSILLECTOMY      Family History  Problem Relation Age of Onset  . Heart disease Mother   . Cancer Brother        lung   . Cancer Sister        breast  . Breast cancer Neg Hx     SOCIAL HX: reviewed.     Current Outpatient Medications:  .  Cholecalciferol (VITAMIN D) 1000 UNITS capsule, Take 1,000 Units by mouth daily., Disp: , Rfl:  .  DULoxetine (CYMBALTA) 60 MG capsule, Take 1 capsule (60 mg total) by mouth at bedtime., Disp: 90 capsule, Rfl: 1 .  ELIQUIS 5 MG TABS tablet, TAKE 1 TABLET BY MOUTH TWICE A DAY, Disp: 180 tablet, Rfl: 1 .  furosemide (LASIX) 20 MG tablet, Take 1 tablet (20 mg total) by mouth daily as needed. Take with potassium, Disp: 90 tablet, Rfl: 3 .  gabapentin (NEURONTIN) 300 MG capsule, TAKE 2 CAPSULES BY MOUTH AT BEDTIME, Disp: 180 capsule, Rfl: 1 .  levothyroxine (SYNTHROID) 100 MCG tablet, TAKE 1 TABLET EVERY DAY ON EMPTY STOMACHWITH A GLASS OF WATER AT LEAST 30-60 MINBEFORE BREAKFAST, Disp: 90 tablet, Rfl: 1 .  lovastatin (MEVACOR) 40 MG tablet, TAKE 1 TABLET BY MOUTH EVERY DAY, Disp: 90 tablet, Rfl: 3 .  metoprolol tartrate (LOPRESSOR) 25 MG tablet, TAKE 1 TABLET BY MOUTH TWICE A DAY, Disp: 180 tablet, Rfl: 0 .  Multiple Vitamins-Minerals (PRESERVISION AREDS 2) CAPS, Take 1 tablet by mouth 2 (two) times daily., Disp: , Rfl:  .  omeprazole (PRILOSEC) 20 MG capsule, TAKE 1 CAPSULE BY MOUTH ONCE DAILY, Disp: 90 capsule, Rfl: 3 .  Polyethyl Glycol-Propyl Glycol (SYSTANE OP), Place 1 drop into both eyes daily as needed (for dry eyes)., Disp: , Rfl:  .  polyethylene glycol powder (GLYCOLAX/MIRALAX) 17 GM/SCOOP powder, Take 17 g by mouth daily., Disp: 850 g, Rfl: 1 .  potassium chloride (KLOR-CON) 10 MEQ tablet, Take 2 tablets (20 mEq total) by mouth daily as needed. Take 2 tablets (20 mEq)  with Lasix (Patient taking differently: Take 20 mEq by mouth daily as needed. Take 2 tablets (20 mEq)  with Lasix), Disp: 180 tablet, Rfl: 3 .  White Petrolatum-Mineral Oil (GENTEAL TEARS NIGHT-TIME OP), Place 1 application into both eyes at bedtime. Night time ointment 3.5g, Disp: , Rfl:   EXAM:  GENERAL: alert, oriented, appears well and in no acute distress  HEENT: atraumatic,  conjunttiva clear, no obvious abnormalities on inspection of external nose and ears  NECK: increased pain (reported) when looking to the left.  Limited rom. Pulling sensation - when looking to the right.   LUNGS: on inspection no signs of respiratory distress, breathing rate appears normal, no obvious gross SOB, gasping or wheezing  CV: no obvious cyanosis  PSYCH/NEURO: pleasant and cooperative, no obvious depression or anxiety, speech and thought processing grossly intact  ASSESSMENT AND PLAN:  Discussed the following assessment and plan:  Problem List Items Addressed This Visit  Essential hypertension    Continue lasix prn.  Follow pressures.        Neck pain    Neck pain as outlined.  Describes neck pain and tingling sensation - up head.  No severe headache.  Increased pain with rotation of her head - alleviated when looking straight.  No motor weakness reported.  In reviewing, she had CT c-spine that revealed straightening and focal reversal of the normal lordosis centered at C3. Mild anterolisthesis C2 on C3 with notable fusion of the left C2-3 facets.  Disc levels:  Additional mild-to-moderate multilevel cervical spondylitic changes are present most pronounced C5-C7, mild canal stenosis at the C5-6 level. Multilevel uncinate spurring and facet hypertrophic changes result in mild-to-moderate multilevel neural foraminal narrowing without severe foraminal stenosis. Discussed her current symptoms and previous CT.  Not taking any medication now.  Will start scheduled tylenol as directed.  May consider low dose muscle relaxer if no improvement with tylenol.  Discussed further evaluation given increased pain and discomfort.  She has seen Dr Sharlet Salina previously for her back.  Request referral back for evaluation and treatment of her neck.  Hold on repeat scanning.  Follow.        Relevant Orders   Ambulatory referral to Orthopedic Surgery       I discussed the assessment and treatment plan  with the patient. The patient was provided an opportunity to ask questions and all were answered. The patient agreed with the plan and demonstrated an understanding of the instructions.   The patient was advised to call back or seek an in-person evaluation if the symptoms worsen or if the condition fails to improve as anticipated.    Einar Pheasant, MD

## 2021-01-16 ENCOUNTER — Encounter: Payer: Self-pay | Admitting: Internal Medicine

## 2021-01-16 DIAGNOSIS — M542 Cervicalgia: Secondary | ICD-10-CM | POA: Insufficient documentation

## 2021-01-16 NOTE — Assessment & Plan Note (Signed)
Continue lasix prn.  Follow pressures.

## 2021-01-16 NOTE — Assessment & Plan Note (Signed)
Neck pain as outlined.  Describes neck pain and tingling sensation - up head.  No severe headache.  Increased pain with rotation of her head - alleviated when looking straight.  No motor weakness reported.  In reviewing, she had CT c-spine that revealed straightening and focal reversal of the normal lordosis centered at C3. Mild anterolisthesis C2 on C3 with notable fusion of the left C2-3 facets.  Disc levels:  Additional mild-to-moderate multilevel cervical spondylitic changes are present most pronounced C5-C7, mild canal stenosis at the C5-6 level. Multilevel uncinate spurring and facet hypertrophic changes result in mild-to-moderate multilevel neural foraminal narrowing without severe foraminal stenosis. Discussed her current symptoms and previous CT.  Not taking any medication now.  Will start scheduled tylenol as directed.  May consider low dose muscle relaxer if no improvement with tylenol.  Discussed further evaluation given increased pain and discomfort.  She has seen Dr Sharlet Salina previously for her back.  Request referral back for evaluation and treatment of her neck.  Hold on repeat scanning.  Follow.

## 2021-01-19 ENCOUNTER — Ambulatory Visit (INDEPENDENT_AMBULATORY_CARE_PROVIDER_SITE_OTHER): Payer: Medicare Other

## 2021-01-19 DIAGNOSIS — M542 Cervicalgia: Secondary | ICD-10-CM | POA: Diagnosis not present

## 2021-01-19 DIAGNOSIS — M5412 Radiculopathy, cervical region: Secondary | ICD-10-CM | POA: Diagnosis not present

## 2021-01-19 DIAGNOSIS — M9931 Osseous stenosis of neural canal of cervical region: Secondary | ICD-10-CM | POA: Diagnosis not present

## 2021-01-19 DIAGNOSIS — I495 Sick sinus syndrome: Secondary | ICD-10-CM | POA: Diagnosis not present

## 2021-01-19 DIAGNOSIS — M5481 Occipital neuralgia: Secondary | ICD-10-CM | POA: Diagnosis not present

## 2021-01-19 LAB — CUP PACEART REMOTE DEVICE CHECK
Battery Remaining Longevity: 110 mo
Battery Remaining Percentage: 95.5 %
Battery Voltage: 3.01 V
Brady Statistic AP VP Percent: 2.6 %
Brady Statistic AP VS Percent: 79 %
Brady Statistic AS VP Percent: 1 %
Brady Statistic AS VS Percent: 14 %
Brady Statistic RA Percent Paced: 75 %
Brady Statistic RV Percent Paced: 2.9 %
Date Time Interrogation Session: 20220426040013
Implantable Lead Implant Date: 20210416
Implantable Lead Implant Date: 20210416
Implantable Lead Location: 753859
Implantable Lead Location: 753860
Implantable Pulse Generator Implant Date: 20210416
Lead Channel Impedance Value: 350 Ohm
Lead Channel Impedance Value: 540 Ohm
Lead Channel Pacing Threshold Amplitude: 0.75 V
Lead Channel Pacing Threshold Amplitude: 0.75 V
Lead Channel Pacing Threshold Pulse Width: 0.5 ms
Lead Channel Pacing Threshold Pulse Width: 0.5 ms
Lead Channel Sensing Intrinsic Amplitude: 1.2 mV
Lead Channel Sensing Intrinsic Amplitude: 12 mV
Lead Channel Setting Pacing Amplitude: 2 V
Lead Channel Setting Pacing Amplitude: 2.5 V
Lead Channel Setting Pacing Pulse Width: 0.5 ms
Lead Channel Setting Sensing Sensitivity: 2 mV
Pulse Gen Model: 2272
Pulse Gen Serial Number: 3813093

## 2021-01-22 ENCOUNTER — Telehealth: Payer: Self-pay | Admitting: Cardiovascular Disease

## 2021-01-22 NOTE — Telephone Encounter (Signed)
Patient with diagnosis of afib on Eliquis for anticoagulation.    Procedure: Cervical steroid epidural injection Date of procedure: 01/28/21  CHA2DS2-VASc Score = 4  This indicates a 4.8% annual risk of stroke. The patient's score is based upon: CHF History: No HTN History: Yes Diabetes History: No Stroke History: No Vascular Disease History: No Age Score: 2 Gender Score: 1     CrCl 44 ml/min  Per office protocol, patient can hold Eliquis for 3 days prior to procedure.

## 2021-01-22 NOTE — Telephone Encounter (Signed)
-----   Message from Will Meredith Leeds, MD sent at 01/22/2021 10:19 AM EDT ----- Abnormal device interrogation reviewed.  Lead parameters and battery status stable.  Rapid AF, increase metoprolol to 50 mg

## 2021-01-22 NOTE — Telephone Encounter (Signed)
   [facility] Pre-operative Risk Assessment    Patient Name: Kerri Mills  DOB: [birth year]  MRN: [id number]   HEARTCARE STAFF: - Please ensure there is not already an duplicate clearance open for this procedure. - Under Visit Info/Reason for Call, type in Other and utilize the format Clearance MM/DD/YY or Clearance TBD. Do not use dashes or single digits. - If request is for dental extraction, please clarify the # of teeth to be extracted.  Request for surgical clearance:  1. What type of surgery is being performed? Cervical steroid epidural injection  2. When is this surgery scheduled? 01/28/21  3. What type of clearance is required (medical clearance vs. Pharmacy clearance to hold med vs. Both)? both  4. Are there any medications that need to be held prior to surgery and how long? Hold Eliquis 72 hours prior   5. Practice name and name of physician performing surgery? [facility] Girth[facility] Hake  6. What is the office phone number? [phone]   7.   What is the office fax number? [phone]  8.   Anesthesia type (None, local, MAC, general) ? Not listed    [name] 01/22/2021, 4:22 PM  _________________________________________________________________   (provider comments below)

## 2021-01-22 NOTE — Telephone Encounter (Signed)
Left message to call back  

## 2021-01-25 NOTE — Telephone Encounter (Signed)
AF is paroxysmal with short bursts that are rapid.  Should not interfere in neck injection.  Thanks.

## 2021-01-25 NOTE — Telephone Encounter (Signed)
   Name: Kerri Mills  DOB: 1931-03-01  MRN: [id number]   Primary Cardiologist: Ida Rogue, MD  Chart reviewed as part of pre-operative protocol coverage. Patient was contacted 01/25/2021 in reference to pre-operative risk assessment for pending surgery as outlined below.  Kerri Mills was last seen on 08/03/2020 by Dr. Rockey Situ.  Since that day, Kerri Mills has done well without any exertional chest discomfort or worsening dyspnea.  Therefore, based on ACC/AHA guidelines, the patient would be at acceptable risk for the planned procedure without further cardiovascular testing.   Patient's metoprolol has been increased to 50 mg twice a day.  She is cleared to hold Eliquis for 3 days prior to the next injection and restart as soon as possible afterward at the surgeon's discretion.  The patient was advised that if she develops new symptoms prior to surgery to contact our office to arrange for a follow-up visit, and she verbalized understanding.  I will route this recommendation to the requesting party via Epic fax function and remove from pre-op pool. Please call with questions.  [place], [place] 01/25/2021, 9:40 AM

## 2021-01-25 NOTE — Telephone Encounter (Signed)
Dr. Curt Bears, will her rapid afib interfere with neck injection? You increased the metoprolol to 50mg . Per device interrogation conclusion on 4/29, 1 VHR episode, 3.5 min with rates 180's. AF burden <1%. Does that means the afib is transient and ok to continue with plan for neck injection?  Please forward your response to [facility] PREOP

## 2021-01-28 DIAGNOSIS — M542 Cervicalgia: Secondary | ICD-10-CM | POA: Diagnosis not present

## 2021-01-28 DIAGNOSIS — M5412 Radiculopathy, cervical region: Secondary | ICD-10-CM | POA: Diagnosis not present

## 2021-01-28 DIAGNOSIS — M9931 Osseous stenosis of neural canal of cervical region: Secondary | ICD-10-CM | POA: Diagnosis not present

## 2021-02-01 DIAGNOSIS — H353221 Exudative age-related macular degeneration, left eye, with active choroidal neovascularization: Secondary | ICD-10-CM | POA: Diagnosis not present

## 2021-02-02 ENCOUNTER — Other Ambulatory Visit: Payer: Self-pay

## 2021-02-02 ENCOUNTER — Encounter: Payer: Self-pay | Admitting: Pulmonary Disease

## 2021-02-02 ENCOUNTER — Ambulatory Visit (INDEPENDENT_AMBULATORY_CARE_PROVIDER_SITE_OTHER): Payer: Medicare Other | Admitting: Pulmonary Disease

## 2021-02-02 VITALS — BP 140/78 | HR 63 | Temp 97.1°F | Ht 65.0 in | Wt 177.2 lb

## 2021-02-02 DIAGNOSIS — G4733 Obstructive sleep apnea (adult) (pediatric): Secondary | ICD-10-CM | POA: Diagnosis not present

## 2021-02-02 DIAGNOSIS — J479 Bronchiectasis, uncomplicated: Secondary | ICD-10-CM | POA: Diagnosis not present

## 2021-02-02 DIAGNOSIS — K449 Diaphragmatic hernia without obstruction or gangrene: Secondary | ICD-10-CM | POA: Diagnosis not present

## 2021-02-02 DIAGNOSIS — R918 Other nonspecific abnormal finding of lung field: Secondary | ICD-10-CM | POA: Diagnosis not present

## 2021-02-02 DIAGNOSIS — K219 Gastro-esophageal reflux disease without esophagitis: Secondary | ICD-10-CM | POA: Diagnosis not present

## 2021-02-02 DIAGNOSIS — R0602 Shortness of breath: Secondary | ICD-10-CM | POA: Diagnosis not present

## 2021-02-02 NOTE — Progress Notes (Signed)
Subjective:    Patient ID: Kerri Mills, female    DOB: November 22, 1930, 87 y.o.   MRN: [id number]  HPI The patient is an 87 year old lifelong never smoker patient of Dr. Zoila Shutter who presents for evaluation of abnormal imaging of the lung.  I am seeing the patient as Dr. Mortimer Fries has been covering ICU.  Chronic cough for many years, she has known bronchiectasis.  She had a CT scan of the chest performed 03 November 2020 that showed findings consistent with bronchiectasis, some mild groundglass and possible MAI.  She has issues with gastroesophageal reflux symptoms.  She has a known hiatal hernia.  She has not had any fevers, chills or sweats.  No hemoptysis.  She has been having some shortness of breath that have been more persistent since she got her pacemaker, she is to see cardiology tomorrow.  No other complaint.  She uses a flutter valve which helps her with clearance of secretions.  Does not have any weight loss or anorexia.  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  She has prior history of breast, thyroid and melanoma cancers in the past none with any recent recurrence..   Review of Systems A 10 point review of systems was performed and it is as noted above otherwise negative.  Patient Active Problem List   Diagnosis Date Noted  . Neck pain 01/16/2021  . UTI (urinary tract infection) 09/21/2020  . Constipation 09/21/2020  . Back skin lesion 07/12/2020  . Vitamin D deficiency 07/12/2020  . Hypothyroid 07/07/2020  . Mucosal irritation of oral cavity 03/02/2020  . Cheek biting 03/02/2020  . Anxiety 03/02/2020  . LBBB (left bundle branch block) 01/11/2020  . Symptomatic bradycardia 01/11/2020  . Tachy-brady syndrome ([place])   . Heart block 01/10/2020  . Syncope 01/08/2020  . Mucopurulent chronic bronchitis ([place]) 11/02/2019  . 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 Left hip pain 06/06/2019  . Encounter for completion of form with patient  03/03/2019  . Abnormal chest CT 12/22/2018  . SOB (shortness of breath) 11/25/2018  . Varicose veins of both lower extremities with inflammation 09/21/2018  . Swelling of lower extremity 08/13/2018  . Right knee pain 05/30/2018  . Fall 05/30/2018  . Sprain of knee 05/30/2018  . Abrasion, knee 05/30/2018  . Osteoarthritis of knee 05/30/2018  . Anemia 02/15/2018  . Bilateral knee pain 11/12/2017  . Atrial fibrillation with rapid ventricular response ([place])   . Demand ischemia ([place])   . Essential hypertension   . Chest pain 09/29/2017  . Dermatitis 09/26/2017  . Osteoarthritis of both knees 06/12/2017  . Osteoporosis, senile 06/12/2017  . Right leg swelling 05/25/2017  . Joint ache 05/25/2017  . Hypercholesterolemia 02/17/2017  . Cough 01/23/2017  . Hyperglycemia 06/18/2016  . Abnormal mammogram of left breast 01/21/2015  . Knee pain, left 11/19/2014  . Diverticulosis of colon without hemorrhage 05/07/2014  . Diverticulosis of large intestine 05/07/2014  . History of breast cancer 01/20/2014  . Stress 06/15/2013  . Obstructive sleep apnea 06/15/2013  . Atrial fibrillation ([place]) 06/15/2013  . Posttraumatic hematoma of left breast 02/19/2013  . Lump or mass in breast 02/19/2013  . History of thyroid cancer 03/19/2011  . GERD (gastroesophageal reflux disease)   . GERD 05/26/2009   Social History   Tobacco Use  . Smoking status: Never Smoker  . Smokeless tobacco: Never Used  Substance Use Topics  . Alcohol use: Yes  Alcohol/week: 0.0 standard drinks    Comment: social drinking. average times a week   Allergies  Allergen Reactions  . Atorvastatin Other (See Comments)    Other reaction(s): Other (See Comments) STIFFNESS AND SORE STIFFNESS AND SORE  . Lipitor [Atorvastatin Calcium] Other (See Comments)    Stiffness & soreness  . Penicillins Rash    REACTION: Unknown reaction   Current Meds  Medication Sig  . Cholecalciferol (VITAMIN D) 1000 UNITS capsule Take 1,000  Units by mouth daily.  . DULoxetine (CYMBALTA) 60 MG capsule Take 1 capsule (60 mg total) by mouth at bedtime.  Marland Kitchen ELIQUIS 5 MG TABS tablet TAKE 1 TABLET BY MOUTH TWICE A DAY  . furosemide (LASIX) 20 MG tablet Take 1 tablet (20 mg total) by mouth daily as needed. Take with potassium  . gabapentin (NEURONTIN) 300 MG capsule TAKE 2 CAPSULES BY MOUTH AT BEDTIME  . levothyroxine (SYNTHROID) 100 MCG tablet TAKE 1 TABLET EVERY DAY ON EMPTY STOMACHWITH A GLASS OF WATER AT LEAST 30-60 MINBEFORE BREAKFAST  . lovastatin (MEVACOR) 40 MG tablet TAKE 1 TABLET BY MOUTH EVERY DAY  . metoprolol tartrate (LOPRESSOR) 25 MG tablet TAKE 1 TABLET BY MOUTH TWICE A DAY (Patient taking differently: 50 mg 2 (two) times daily.)  . Multiple Vitamins-Minerals (PRESERVISION AREDS 2) CAPS Take 1 tablet by mouth 2 (two) times daily.  Marland Kitchen omeprazole (PRILOSEC) 20 MG capsule TAKE 1 CAPSULE BY MOUTH ONCE DAILY  . Polyethyl Glycol-Propyl Glycol (SYSTANE OP) Place 1 drop into both eyes daily as needed (for dry eyes).  . polyethylene glycol powder (GLYCOLAX/MIRALAX) 17 GM/SCOOP powder Take 17 g by mouth daily.  . potassium chloride (KLOR-CON) 10 MEQ tablet Take 2 tablets (20 mEq total) by mouth daily as needed. Take 2 tablets (20 mEq)  with Lasix (Patient taking differently: Take 20 mEq by mouth daily as needed. Take 2 tablets (20 mEq)  with Lasix)  . White Petrolatum-Mineral Oil (GENTEAL TEARS NIGHT-TIME OP) Place 1 application into both eyes at bedtime. Night time ointment 3.5g    Immunization History  Administered Date(s) Administered  . Fluad Quad(high Dose 65+) 06/06/2019, 07/07/2020  . Influenza Split 07/18/2014  . Influenza, High Dose Seasonal PF 07/01/2015, 06/13/2016, 05/23/2017, 06/25/2018  . Influenza,inj,Quad PF,6+ Mos 06/11/2013  . Moderna Sars-Covid-2 Vaccination 10/08/2019, 11/05/2019, 07/24/2020  . Pneumococcal Conjugate-13 12/18/2013  . Pneumococcal Polysaccharide-23 07/01/2015  . Tdap 11/15/2016  . Zoster  Recombinat (Shingrix) 06/25/2018, 07/13/2018, 11/07/2018       Objective:   Physical Exam BP 140/78 (BP Location: Left Arm, Cuff Size: Normal)   Pulse 63   Temp (!) 97.1 F (36.2 C) (Temporal)   Ht 5\' 5"  (1.651 m)   Wt 177 lb 3.2 oz (80.4 kg)   SpO2 95%   BMI 29.49 kg/m  GENERAL: Overweight well-developed elderly woman, no acute distress.  She is quite spry.  Fully ambulatory.  No conversational dyspnea. HEAD: Normocephalic, atraumatic.  EYES: Pupils equal, round, reactive to light.  No scleral icterus.  MOUTH: Nose/mouth/throat not examined due to masking requirements for COVID 19. NECK: Supple. No thyromegaly. Trachea midline. No JVD.  No adenopathy. PULMONARY: Good air entry bilaterally.  No adventitious sounds. CARDIOVASCULAR: S1 and S2. Regular rate and rhythm.  ABDOMEN: Benign. MUSCULOSKELETAL: No joint deformity, no clubbing, no edema.  NEUROLOGIC: No focal deficit, no gait disturbance, speech is fluent. SKIN: Intact,warm,dry.  On limited exam no rashes or skin lesions. PSYCH: Mood and behavior normal.  Representative images from CT scan of the chest performed  03 November 2020:     Assessment & Plan:     ICD-10-CM   1. Abnormal findings on diagnostic imaging of lung  R91.8 CT Chest Wo Contrast   Possibilities include MAI, chronic silent aspiration Repeat CT in August  2. Bronchiectasis without complication ([place])  L93.7    Likely secondary to MAI Past pneumonia sequela also possibility  3. Hiatal hernia with GERD  K21.9    K44.9    This adds complexity to her management Continue antireflux measures Continue PPI  4. Shortness of breath  R06.02 Pulmonary Function Test [facility] Only   Patient has been noticing increasing shortness of breath May be related to cardiac issues Will obtain PFTs  5. OSA (obstructive sleep apnea)  G47.33    Continue use of CPAP Managed by Dr. Mortimer Fries   Orders Placed This Encounter  Procedures  . CT Chest Wo Contrast    Standing Status:    Future    Standing Expiration Date:   02/02/2022    Scheduling Instructions:     04/2021    Order Specific Question:   Preferred imaging location?    Answer:   [facility]  . Pulmonary Function Test [facility] Only    Standing Status:   Future    Standing Expiration Date:   02/02/2022    Scheduling Instructions:     15mo.    Order Specific Question:   Full PFT: includes the following: basic spirometry, spirometry pre & post bronchodilator, diffusion capacity (DLCO), lung volumes    Answer:   Full PFT   We discussed the findings with the patient.  Reassured her.  Currently nothing to do except follow it expectantly.  We will see her in follow-up after repeat CT scan was performed.  She will see either me or Dr. Mortimer Fries.  Renold Don, MD [facility]   *This note was dictated using voice recognition software/Dragon.  Despite best efforts to proofread, errors can occur which can change the meaning.  Any change was purely unintentional.

## 2021-02-02 NOTE — Progress Notes (Signed)
Cardiology Office Note  Date:  02/03/2021   ID:  Kerri Mills, DOB 26-Dec-1930, MRN [id number]  PCP:  Einar Pheasant, MD   Chief Complaint  Patient presents with  . Follow-up    6 Months follow up and c/o having some SOB. Medications verbally reviewed with patient.     HPI:  Kerri Mills is a 87 y.o. female with  persistent atrial fibrillation, on anticoagulation SSS, pacer hypertension,  hyperlipidemia,  Demand ischemia Shortness of breath LBBB,  OSA,  breast cancer s/p lumpectomy and XRT,  on the left Hospital admission September 30, 2017 for chest pain Evaluated by cardiology for atrial fibrillation with RVR and chest pain  She presents today for follow-up after recent hospitalization, follow-up of her chest pain and A. Fib.  On today's visit reports she is doing relatively well Neck injection last week, neck is in a knot  Pacer download reviewed, atrial fib episodes with rapid rate Evaluated by EP, recommendation to increase metoprolol up to 50 twice daily Having "moments" with SOB, possibly from atrial fibrillation "moments" are better on higher metoprolol Recommend she check her pulse when she is having episodes of shortness of breath, scheduled to see EP next month  Followed by Dr. Mortimer Fries Needs CT scan, possible MAI on CT Coughs daily, sputum in the Am, "I have to get it up"  Walks at [place],  "don't take much lasix" Previously taking 2x a week with potassium Minimal leg swelling  Uses CPAP  EKG personal reviewed by myself on todays visit NSR rate 60 bpm,  Paced rhythm  Other past medical history reviewed January 08, 2020 when she was walking in the cafeteria at her assisted living facility and had sudden onset of shortness of breath and lightheadedness. She was trying to get to the nearest table but suddenly lost consciousness  Pauses 6 sec on zio monitor off metoprolol  Implantation of a St Jude Medical Assurity MRI model [id number] (serial number [id number]  ) pacemaker on 01/10/20 by Dr. Curt Bears  Echo  12/10/2018 that showed mild wall thickening and bilateral bronchiectasis   hospital 09/30/2017 Nauseous chest pressure short of breath and tremulous.   called EMS and was found to be in atrial fibrillation with rates in the 120s.   admitted at [facility] where she was in atrial fibrillation at 120 bpm.   LBBB was noted on EKG.   INR the day prior to admission was 3.1.   started on a diltiazem infusion  orthopnea overnight.  Bradycardia on diltiazem and metoprolol   PMH:   has a past medical history of Allergy, Anxiety, Arthritis, Atypical chest pain, Clotting disorder ([place]), Colitis, Depression, Diverticulitis (2013), Gastric ulcer, GERD (gastroesophageal reflux disease), History of echocardiogram, Hypercholesterolemia, Hypertension, Infiltrating lobular carcinoma of left breast (2011), Melanoma ([place]) (1997), Melanoma in situ of upper extremity ([place]) (03/19/2011), Persistent atrial fibrillation ([place]), Personal history of radiation therapy (2011), Seroma, Sleep apnea, and Thyroid cancer ([place]) (1992).  PSH:    Past Surgical History:  Procedure Laterality Date  . ABDOMINAL HYSTERECTOMY  1973   partial  . BREAST BIOPSY Left 02-13-13   BENIGN BREAST TISSUE WITH CHANGES CONSISTENT WITH FAT NECROSIS  . BREAST BIOPSY Left 01/21/2015   bx done in [facility] 11:00 left 6-8cmfn  . BREAST EXCISIONAL BIOPSY Left 1995   neg  . BREAST EXCISIONAL BIOPSY Left 2011   Breast cancer radiation  . BREAST LUMPECTOMY Left 2011   BREAST CA  . CARDIAC CATHETERIZATION    .  CHOLECYSTECTOMY    . COLONOSCOPY  2013  . MELANOMA EXCISION     RT UPPER ARM  . PACEMAKER IMPLANT N/A 01/10/2020   Procedure: PACEMAKER IMPLANT;  Surgeon: Constance Haw, MD;  Location: [place] [facility];  Service: Cardiovascular;  Laterality: N/A;  . PARTIAL HYSTERECTOMY     bleeding, ovaries in place.    . THYROID SURGERY  1992   FOR THYROID CANCER  . TONSILLECTOMY      Current  Outpatient Medications  Medication Sig Dispense Refill  . cholecalciferol (VITAMIN D3) 25 MCG (1000 UNIT) tablet Take 1,000 Units by mouth every other day.    . DULoxetine (CYMBALTA) 60 MG capsule Take 1 capsule (60 mg total) by mouth at bedtime. 90 capsule 1  . ELIQUIS 5 MG TABS tablet TAKE 1 TABLET BY MOUTH TWICE A DAY 180 tablet 1  . furosemide (LASIX) 20 MG tablet Take 1 tablet (20 mg total) by mouth daily as needed. Take with potassium 90 tablet 3  . gabapentin (NEURONTIN) 300 MG capsule TAKE 2 CAPSULES BY MOUTH AT BEDTIME 180 capsule 1  . levothyroxine (SYNTHROID) 100 MCG tablet TAKE 1 TABLET EVERY DAY ON EMPTY STOMACHWITH A GLASS OF WATER AT LEAST 30-60 MINBEFORE BREAKFAST 90 tablet 1  . lovastatin (MEVACOR) 40 MG tablet TAKE 1 TABLET BY MOUTH EVERY DAY 90 tablet 3  . Multiple Vitamins-Minerals (PRESERVISION AREDS 2) CAPS Take 1 tablet by mouth 2 (two) times daily.    Marland Kitchen omeprazole (PRILOSEC) 20 MG capsule TAKE 1 CAPSULE BY MOUTH ONCE DAILY 90 capsule 3  . Polyethyl Glycol-Propyl Glycol (SYSTANE OP) Place 1 drop into both eyes daily as needed (for dry eyes).    . polyethylene glycol powder (GLYCOLAX/MIRALAX) 17 GM/SCOOP powder Take 17 g by mouth daily. 850 g 1  . potassium chloride (KLOR-CON) 10 MEQ tablet Take 2 tablets as needed when taking Lasix    . White Petrolatum-Mineral Oil (GENTEAL TEARS NIGHT-TIME OP) Place 1 application into both eyes at bedtime. Night time ointment 3.5g    . metoprolol tartrate (LOPRESSOR) 50 MG tablet Take 1 tablet (50 mg total) by mouth 2 (two) times daily. 180 tablet 3   No current facility-administered medications for this visit.     Allergies:   Atorvastatin, Lipitor [atorvastatin calcium], and Penicillins   Social History:  The patient  reports that she has never smoked. She has never used smokeless tobacco. She reports current alcohol use. She reports that she does not use drugs.   Family History:   family history includes Cancer in her brother and  sister; Heart disease in her mother.   Review of Systems  Constitutional: Negative.   HENT: Negative.   Eyes: Negative.   Respiratory: Negative.  Negative for shortness of breath.   Cardiovascular: Negative.   Gastrointestinal: Negative.   Genitourinary: Negative.   Musculoskeletal: Negative.   Neurological: Negative.  Negative for dizziness and headaches.  Psychiatric/Behavioral: Negative.   All other systems reviewed and are negative.   PHYSICAL EXAM: VS:  BP 126/80 (BP Location: Left Arm, Patient Position: Sitting, Cuff Size: Normal)   Pulse 60   Ht 5\' 5"  (1.651 m)   Wt 176 lb (79.8 kg)   SpO2 96%   BMI 29.29 kg/m  , BMI Body mass index is 29.29 kg/m.  Constitutional:  oriented to person, place, and time. No distress.  HENT:  Head: Grossly normal Eyes:  no discharge. No scleral icterus.  Neck: No JVD, no carotid bruits  Cardiovascular: Regular  rate and rhythm, no murmurs appreciated Pulmonary/Chest: Clear to auscultation bilaterally, no wheezes or rales Abdominal: Soft.  no distension.  no tenderness.  Musculoskeletal: Normal range of motion Neurological:  normal muscle tone. Coordination normal. No atrophy Skin: Skin warm and dry Psychiatric: normal affect, pleasant   Recent Labs: 06/02/2020: ALT 10; ALT 10; BUN 15; Creatinine, Ser 0.92; Potassium 4.9; Sodium 142 07/07/2020: TSH 1.24    Lipid Panel Lab Results  Component Value Date   CHOL 154 06/02/2020   HDL 38.40 (L) 06/02/2020   LDLCALC 86 06/02/2020   TRIG 146.0 06/02/2020      Wt Readings from Last 3 Encounters:  02/03/21 176 lb (79.8 kg)  02/02/21 177 lb 3.2 oz (80.4 kg)  01/15/21 178 lb (80.7 kg)     ASSESSMENT AND PLAN:  Persistent atrial fibrillation (HCC) - Plan: EKG 12-Lead Normal sinus rhythm, Pacer downloads reviewed with her Agree with metoprolol tartrate 50 twice daily Feels her episodes of shortness of breath may have improved, recommend she monitor her heart rate when she has  shortness of breath symptoms Scheduled to see EP next month  Frequent PVCs Likely asymptomatic, now on higher dose metoprolol  Essential hypertension - Blood pressure is well controlled on today's visit. No changes made to the medications.  Shortness of breath Difficult to discern between diastolic CHF and paroxysmal atrial fibrillation Only taking Lasix very sparingly, recommend she take extra Lasix for shortness of breath spells, especially in the setting of lower extremity edema or abdominal distention  Pure hypercholesterolemia -  Cholesterol is at goal on the current lipid regimen. No changes to the medications were made.    Total encounter time more than 25 minutes  Greater than 50% was spent in counseling and coordination of care with the patient    Orders Placed This Encounter  Procedures  . EKG 12-Lead     Signed, Esmond Plants, M.D., Ph.D. 02/03/2021  [place], [place]

## 2021-02-02 NOTE — Patient Instructions (Signed)
We are going to schedule some breathing tests that will be done prior to your return to the clinic.  We are scheduling a CT scan of the chest to be done to follow-up on the findings on your scan.  As we discussed today the findings on your CT scan are not worrisome at this point.  We will see you in follow-up after your CT scan of the chest is done appointment with either me or Dr. Mortimer Fries .

## 2021-02-03 ENCOUNTER — Encounter: Payer: Self-pay | Admitting: Cardiovascular Disease

## 2021-02-03 ENCOUNTER — Ambulatory Visit (INDEPENDENT_AMBULATORY_CARE_PROVIDER_SITE_OTHER): Payer: Medicare Other | Admitting: Cardiovascular Disease

## 2021-02-03 VITALS — BP 126/80 | HR 60 | Ht 65.0 in | Wt 176.0 lb

## 2021-02-03 DIAGNOSIS — I495 Sick sinus syndrome: Secondary | ICD-10-CM

## 2021-02-03 DIAGNOSIS — E7800 Pure hypercholesterolemia, unspecified: Secondary | ICD-10-CM | POA: Diagnosis not present

## 2021-02-03 DIAGNOSIS — I4819 Other persistent atrial fibrillation: Secondary | ICD-10-CM | POA: Diagnosis not present

## 2021-02-03 DIAGNOSIS — R0789 Other chest pain: Secondary | ICD-10-CM | POA: Diagnosis not present

## 2021-02-03 DIAGNOSIS — R55 Syncope and collapse: Secondary | ICD-10-CM

## 2021-02-03 DIAGNOSIS — I10 Essential (primary) hypertension: Secondary | ICD-10-CM | POA: Diagnosis not present

## 2021-02-03 MED ORDER — METOPROLOL TARTRATE 50 MG PO TABS: 50.0000 mg | ORAL_TABLET | Freq: Two times a day (BID) | ORAL | 3 refills | Status: DC

## 2021-02-03 NOTE — Patient Instructions (Signed)
Medication Instructions:  No changes  If you need a refill on your cardiac medications before your next appointment, please call your pharmacy.    Lab work: No new labs needed   If you have labs (blood work) drawn today and your tests are completely normal, you will receive your results only by: . MyChart Message (if you have MyChart) OR . A paper copy in the mail If you have any lab test that is abnormal or we need to change your treatment, we will call you to review the results.   Testing/Procedures: No new testing needed   Follow-Up: At [facility], you and your health needs are our priority.  As part of our continuing mission to provide you with exceptional heart care, we have created designated Provider Care Teams.  These Care Teams include your primary Cardiologist (physician) and Advanced Practice Providers (APPs -  Physician Assistants and Nurse Practitioners) who all work together to provide you with the care you need, when you need it.  . You will need a follow up appointment in 6 months  . Providers on your designated Care Team:   . Christopher Berge, NP . Ryan Dunn, PA-C . Jacquelyn Visser, PA-C  Any Other Special Instructions Will Be Listed Below (If Applicable).  COVID-19 Vaccine Information can be found at: [email or web address] For questions related to vaccine distribution or appointments, please email [email or web address] or call [phone].     

## 2021-02-09 NOTE — Progress Notes (Signed)
Remote pacemaker transmission.   

## 2021-02-10 DIAGNOSIS — M9931 Osseous stenosis of neural canal of cervical region: Secondary | ICD-10-CM | POA: Diagnosis not present

## 2021-02-10 DIAGNOSIS — M542 Cervicalgia: Secondary | ICD-10-CM | POA: Diagnosis not present

## 2021-02-10 DIAGNOSIS — M5481 Occipital neuralgia: Secondary | ICD-10-CM | POA: Diagnosis not present

## 2021-02-10 DIAGNOSIS — M5412 Radiculopathy, cervical region: Secondary | ICD-10-CM | POA: Diagnosis not present

## 2021-02-24 DIAGNOSIS — M9931 Osseous stenosis of neural canal of cervical region: Secondary | ICD-10-CM | POA: Diagnosis not present

## 2021-02-24 DIAGNOSIS — M5412 Radiculopathy, cervical region: Secondary | ICD-10-CM | POA: Diagnosis not present

## 2021-02-24 DIAGNOSIS — M542 Cervicalgia: Secondary | ICD-10-CM | POA: Diagnosis not present

## 2021-02-24 DIAGNOSIS — M5481 Occipital neuralgia: Secondary | ICD-10-CM | POA: Diagnosis not present

## 2021-03-05 ENCOUNTER — Ambulatory Visit (INDEPENDENT_AMBULATORY_CARE_PROVIDER_SITE_OTHER): Payer: Medicare Other | Admitting: Cardiology

## 2021-03-05 ENCOUNTER — Other Ambulatory Visit: Payer: Self-pay

## 2021-03-05 ENCOUNTER — Encounter: Payer: Self-pay | Admitting: Cardiology

## 2021-03-05 DIAGNOSIS — I495 Sick sinus syndrome: Secondary | ICD-10-CM

## 2021-03-05 DIAGNOSIS — R001 Bradycardia, unspecified: Secondary | ICD-10-CM | POA: Diagnosis not present

## 2021-03-05 DIAGNOSIS — J410 Simple chronic bronchitis: Secondary | ICD-10-CM | POA: Diagnosis not present

## 2021-03-05 DIAGNOSIS — G4733 Obstructive sleep apnea (adult) (pediatric): Secondary | ICD-10-CM | POA: Diagnosis not present

## 2021-03-05 DIAGNOSIS — J479 Bronchiectasis, uncomplicated: Secondary | ICD-10-CM | POA: Diagnosis not present

## 2021-03-05 NOTE — Progress Notes (Signed)
Electrophysiology Office Note   Date:  03/05/2021   ID:  [name], [name] Apr 28, 1931, MRN [id number]  PCP:  Einar Pheasant, MD  Cardiologist:  Rockey Situ Primary Electrophysiologist:  [name], MD    Chief Complaint: pacemaker   History of Present Illness: Kerri Mills is a 87 y.o. female who is being seen today for the evaluation of pacemaker at the request of Einar Pheasant, MD. Presenting today for electrophysiology evaluation.  She has a history significant for atrial fibrillation, hypertension, PVCs, and tachybradycardia syndrome.  She presented to hospital April 2021 with tachybradycardia syndrome.  She is now status post [place] dual-chamber pacemaker implanted 01/10/2020.  Today, denies symptoms of palpitations, chest pain, shortness of breath, orthopnea, PND, lower extremity edema, claudication, dizziness, presyncope, syncope, bleeding, or neurologic sequela. The patient is tolerating medications without difficulties.  She is currently feeling well.  She has no chest pain.  She does develop shortness of breath.  Shortness of breath is worse when she is climbing stairs and at times she feels like she is smothering when she lays flat.  Aside from that, she is doing well.  She continues to walk half a mile a day with her dog.   Past Medical History:  Diagnosis Date   Allergy    Anxiety    Arthritis    Atypical chest pain    a. 10/2018 MV: small, fixed apical defect possibly 2/2 attenuation artifact. No ischemia.  EF 68%.   Clotting disorder ([place])    Colitis    Depression    Diverticulitis 2013   Gastric ulcer    GERD (gastroesophageal reflux disease)    History of echocardiogram    a. 09/2019 Echo: EF 55-60%, mod LVH. Mildly dil LA. Triv MR/TR.   Hypercholesterolemia    Hypertension    Infiltrating lobular carcinoma of left breast 2011   T2,N0, ER: 90%; PR 0%; Her 2 neu not amplified. [facility]).   Melanoma ([place]) 1997   Melanoma in situ of upper  extremity ([place]) 03/19/2011   Persistent atrial fibrillation ([place])    a. CHADS2VASc => 4 (HTN, age x 2, female)   Personal history of radiation therapy 2011   BREAST CA   Seroma    HISTORY OF LFT BREAST   Sleep apnea    Thyroid cancer ([place]) 1992   Past Surgical History:  Procedure Laterality Date   ABDOMINAL HYSTERECTOMY  1973   partial   BREAST BIOPSY Left 02-13-13   BENIGN BREAST TISSUE WITH CHANGES CONSISTENT WITH FAT NECROSIS   BREAST BIOPSY Left 01/21/2015   bx done in [facility] 11:00 left 6-8cmfn   BREAST EXCISIONAL BIOPSY Left 1995   neg   BREAST EXCISIONAL BIOPSY Left 2011   Breast cancer radiation   BREAST LUMPECTOMY Left 2011   BREAST CA   CARDIAC CATHETERIZATION     CHOLECYSTECTOMY     COLONOSCOPY  2013   MELANOMA EXCISION     RT UPPER ARM   PACEMAKER IMPLANT N/A 01/10/2020   Procedure: PACEMAKER IMPLANT;  Surgeon: Constance Haw, MD;  Location: [place] [facility];  Service: Cardiovascular;  Laterality: N/A;   PARTIAL HYSTERECTOMY     bleeding, ovaries in place.     THYROID SURGERY  1992   FOR THYROID CANCER   TONSILLECTOMY       Current Outpatient Medications  Medication Sig Dispense Refill   cholecalciferol (VITAMIN D3) 25 MCG (1000 UNIT) tablet Take 1,000 Units by mouth every  other day.     DULoxetine (CYMBALTA) 60 MG capsule Take 1 capsule (60 mg total) by mouth at bedtime. 90 capsule 1   ELIQUIS 5 MG TABS tablet TAKE 1 TABLET BY MOUTH TWICE A DAY 180 tablet 1   furosemide (LASIX) 20 MG tablet Take 1 tablet (20 mg total) by mouth daily as needed. Take with potassium 90 tablet 3   gabapentin (NEURONTIN) 300 MG capsule TAKE 2 CAPSULES BY MOUTH AT BEDTIME 180 capsule 1   levothyroxine (SYNTHROID) 100 MCG tablet TAKE 1 TABLET EVERY DAY ON EMPTY STOMACHWITH A GLASS OF WATER AT LEAST 30-60 MINBEFORE BREAKFAST 90 tablet 1   lovastatin (MEVACOR) 40 MG tablet TAKE 1 TABLET BY MOUTH EVERY DAY 90 tablet 3   metoprolol tartrate (LOPRESSOR) 50 MG tablet Take 1  tablet (50 mg total) by mouth 2 (two) times daily. 180 tablet 3   Multiple Vitamins-Minerals (PRESERVISION AREDS 2) CAPS Take 1 tablet by mouth 2 (two) times daily.     omeprazole (PRILOSEC) 20 MG capsule TAKE 1 CAPSULE BY MOUTH ONCE DAILY 90 capsule 3   Polyethyl Glycol-Propyl Glycol (SYSTANE OP) Place 1 drop into both eyes daily as needed (for dry eyes).     polyethylene glycol powder (GLYCOLAX/MIRALAX) 17 GM/SCOOP powder Take 17 g by mouth daily. 850 g 1   potassium chloride (KLOR-CON) 10 MEQ tablet Take 2 tablets as needed when taking Lasix     White Petrolatum-Mineral Oil (GENTEAL TEARS NIGHT-TIME OP) Place 1 application into both eyes at bedtime. Night time ointment 3.5g     No current facility-administered medications for this visit.    Allergies:   Atorvastatin, Lipitor [atorvastatin calcium], and Penicillins   Social History:  The patient  reports that she has never smoked. She has never used smokeless tobacco. She reports current alcohol use. She reports that she does not use drugs.   Family History:  The patient's family history includes Cancer in her brother and sister; Heart disease in her mother.   ROS:  Please see the history of present illness.   Otherwise, review of systems is positive for none.   All other systems are reviewed and negative.   PHYSICAL EXAM: VS:  BP 120/72   Pulse 72   Ht 5\' 5"  (1.651 m)   Wt 175 lb 9.6 oz (79.7 kg)   SpO2 96%   BMI 29.22 kg/m  , BMI Body mass index is 29.22 kg/m. GEN: Well nourished, well developed, in no acute distress  HEENT: normal  Neck: no JVD, carotid bruits, or masses Cardiac: RRR; no murmurs, rubs, or gallops,no edema  Respiratory:  clear to auscultation bilaterally, normal work of breathing GI: soft, nontender, nondistended, + BS MS: no deformity or atrophy  Skin: warm and dry, device site well healed Neuro:  Strength and sensation are intact Psych: euthymic mood, full affect  EKG:  EKG is not ordered today. Personal  review of the ekg ordered 02/03/21 shows atrial paced  Personal review of the device interrogation today. Results in [place]: 06/02/2020: ALT 10; ALT 10; BUN 15; Creatinine, Ser 0.92; Potassium 4.9; Sodium 142 07/07/2020: TSH 1.24    Lipid Panel     Component Value Date/Time   CHOL 154 06/02/2020 0947   TRIG 146.0 06/02/2020 0947   HDL 38.40 (L) 06/02/2020 0947   CHOLHDL 4 06/02/2020 0947   VLDL 29.2 06/02/2020 0947   LDLCALC 86 06/02/2020 0947   LDLDIRECT 75.0 03/24/2017 1206  Wt Readings from Last 3 Encounters:  03/05/21 175 lb 9.6 oz (79.7 kg)  02/03/21 176 lb (79.8 kg)  02/02/21 177 lb 3.2 oz (80.4 kg)      Other studies Reviewed: Additional studies/ records that were reviewed today include: TTE 10/21/19  Review of the above records today demonstrates:   1. Left ventricular ejection fraction, by visual estimation, is 55 to  60%. The left ventricle has normal function. There is moderately increased  left ventricular hypertrophy.   2. Left ventricular diastolic parameters are indeterminate.   3. The left ventricle has no regional wall motion abnormalities.   4. Global right ventricle has normal systolic function.The right  ventricular size is normal. No increase in right ventricular wall  thickness.   5. Left atrial size was mildly dilated.   6. Right atrial size was normal.   7. The mitral valve is normal in structure. Trivial mitral valve  regurgitation. No evidence of mitral stenosis.   8. The tricuspid valve is normal in structure. Tricuspid valve  regurgitation is trivial.   9. The aortic valve is normal in structure. Aortic valve regurgitation is  not visualized. Mild to moderate aortic valve sclerosis/calcification  without any evidence of aortic stenosis.  10. The pulmonic valve was normal in structure. Pulmonic valve  regurgitation is not visualized.  11. TR signal is inadequate for assessing pulmonary artery systolic  pressure.  12. The  inferior vena cava is normal in size with greater than 50%  respiratory variability, suggesting right atrial pressure of 3 mmHg.    ASSESSMENT AND PLAN:  1.  Tachybradycardia syndrome: Status post [employer] dual-chamber pacemaker implanted 01/10/2020.  Device functioning appropriately.  No changes at this time.    2.  Persistent atrial fibrillation: Currently on Eliquis.  CHA2DS2-VASc of 4.  3.  Hyperlipidemia: Continue statin per primary cardiology  4.  PVCs: Asymptomatic with normal ejection fraction.  No changes.  Current medicines are reviewed at length with the patient today.   The patient does not have concerns regarding her medicines.  The following changes were made today:  none  Labs/ tests ordered today include:  No orders of the defined types were placed in this encounter.    Disposition:   FU with Yunique [place] 12 months  Signed, Laree Garron Meredith Leeds, MD  03/05/2021 3:10 PM     [place] [address] [phone] (office) [phone] (fax)

## 2021-03-10 DIAGNOSIS — M5481 Occipital neuralgia: Secondary | ICD-10-CM | POA: Diagnosis not present

## 2021-03-10 DIAGNOSIS — M542 Cervicalgia: Secondary | ICD-10-CM | POA: Diagnosis not present

## 2021-03-10 DIAGNOSIS — M9931 Osseous stenosis of neural canal of cervical region: Secondary | ICD-10-CM | POA: Diagnosis not present

## 2021-03-15 ENCOUNTER — Ambulatory Visit (INDEPENDENT_AMBULATORY_CARE_PROVIDER_SITE_OTHER): Payer: Medicare Other | Admitting: Internal Medicine

## 2021-03-15 ENCOUNTER — Other Ambulatory Visit: Payer: Self-pay

## 2021-03-15 ENCOUNTER — Ambulatory Visit (INDEPENDENT_AMBULATORY_CARE_PROVIDER_SITE_OTHER): Payer: Medicare Other

## 2021-03-15 ENCOUNTER — Encounter: Payer: Self-pay | Admitting: Internal Medicine

## 2021-03-15 VITALS — BP 128/76 | HR 66 | Temp 97.6°F | Resp 16 | Ht 65.0 in | Wt 175.6 lb

## 2021-03-15 DIAGNOSIS — K219 Gastro-esophageal reflux disease without esophagitis: Secondary | ICD-10-CM | POA: Diagnosis not present

## 2021-03-15 DIAGNOSIS — I10 Essential (primary) hypertension: Secondary | ICD-10-CM

## 2021-03-15 DIAGNOSIS — Z85850 Personal history of malignant neoplasm of thyroid: Secondary | ICD-10-CM

## 2021-03-15 DIAGNOSIS — M25561 Pain in right knee: Secondary | ICD-10-CM

## 2021-03-15 DIAGNOSIS — K625 Hemorrhage of anus and rectum: Secondary | ICD-10-CM | POA: Diagnosis not present

## 2021-03-15 DIAGNOSIS — E039 Hypothyroidism, unspecified: Secondary | ICD-10-CM | POA: Diagnosis not present

## 2021-03-15 DIAGNOSIS — J411 Mucopurulent chronic bronchitis: Secondary | ICD-10-CM | POA: Diagnosis not present

## 2021-03-15 DIAGNOSIS — R0602 Shortness of breath: Secondary | ICD-10-CM

## 2021-03-15 DIAGNOSIS — E559 Vitamin D deficiency, unspecified: Secondary | ICD-10-CM | POA: Diagnosis not present

## 2021-03-15 DIAGNOSIS — D649 Anemia, unspecified: Secondary | ICD-10-CM

## 2021-03-15 DIAGNOSIS — I4891 Unspecified atrial fibrillation: Secondary | ICD-10-CM | POA: Diagnosis not present

## 2021-03-15 DIAGNOSIS — I495 Sick sinus syndrome: Secondary | ICD-10-CM | POA: Diagnosis not present

## 2021-03-15 DIAGNOSIS — M542 Cervicalgia: Secondary | ICD-10-CM

## 2021-03-15 DIAGNOSIS — R739 Hyperglycemia, unspecified: Secondary | ICD-10-CM

## 2021-03-15 DIAGNOSIS — Z853 Personal history of malignant neoplasm of breast: Secondary | ICD-10-CM

## 2021-03-15 DIAGNOSIS — E7800 Pure hypercholesterolemia, unspecified: Secondary | ICD-10-CM

## 2021-03-15 DIAGNOSIS — M7989 Other specified soft tissue disorders: Secondary | ICD-10-CM

## 2021-03-15 LAB — CBC WITH DIFFERENTIAL/PLATELET
Basophils Absolute: 0.1 10*3/uL (ref 0.0–0.1)
Basophils Relative: 1.1 % (ref 0.0–3.0)
Eosinophils Absolute: 0.2 10*3/uL (ref 0.0–0.7)
Eosinophils Relative: 3.4 % (ref 0.0–5.0)
HCT: 39.3 % (ref 36.0–46.0)
Hemoglobin: 12.9 g/dL (ref 12.0–15.0)
Lymphocytes Relative: 19.8 % (ref 12.0–46.0)
Lymphs Abs: 1.1 10*3/uL (ref 0.7–4.0)
MCHC: 32.9 g/dL (ref 30.0–36.0)
MCV: 92 fl (ref 78.0–100.0)
Monocytes Absolute: 0.5 10*3/uL (ref 0.1–1.0)
Monocytes Relative: 8.6 % (ref 3.0–12.0)
Neutro Abs: 3.6 10*3/uL (ref 1.4–7.7)
Neutrophils Relative %: 67.1 % (ref 43.0–77.0)
Platelets: 196 10*3/uL (ref 150.0–400.0)
RBC: 4.27 Mil/uL (ref 3.87–5.11)
RDW: 13.1 % (ref 11.5–15.5)
WBC: 5.4 10*3/uL (ref 4.0–10.5)

## 2021-03-15 LAB — BASIC METABOLIC PANEL
BUN: 14 mg/dL (ref 6–23)
CO2: 29 mEq/L (ref 19–32)
Calcium: 9.8 mg/dL (ref 8.4–10.5)
Chloride: 103 mEq/L (ref 96–112)
Creatinine, Ser: 0.81 mg/dL (ref 0.40–1.20)
GFR: 65.13 mL/min (ref >60.00)
Glucose, Bld: 107 mg/dL — ABNORMAL HIGH (ref 70–99)
Potassium: 4.3 mEq/L (ref 3.5–5.1)
Sodium: 140 mEq/L (ref 135–145)

## 2021-03-15 LAB — LIPID PANEL
Cholesterol: 163 mg/dL (ref 0–200)
HDL: 35.5 mg/dL — ABNORMAL LOW (ref >39.00)
NonHDL: 127.03
Total CHOL/HDL Ratio: 5
Triglycerides: 210 mg/dL — ABNORMAL HIGH (ref 0.0–149.0)
VLDL: 42 mg/dL — ABNORMAL HIGH (ref 0.0–40.0)

## 2021-03-15 LAB — VITAMIN D 25 HYDROXY (VIT D DEFICIENCY, FRACTURES): VITD: 20.63 ng/mL — ABNORMAL LOW (ref 30.00–100.00)

## 2021-03-15 LAB — HEPATIC FUNCTION PANEL
ALT: 10 U/L (ref 0–35)
AST: 14 U/L (ref 0–37)
Albumin: 3.9 g/dL (ref 3.5–5.2)
Alkaline Phosphatase: 130 U/L — ABNORMAL HIGH (ref 39–117)
Bilirubin, Direct: 0.1 mg/dL (ref 0.0–0.3)
Total Bilirubin: 0.5 mg/dL (ref 0.2–1.2)
Total Protein: 6.2 g/dL (ref 6.0–8.3)

## 2021-03-15 LAB — TSH: TSH: 2.71 u[IU]/mL (ref 0.35–4.50)

## 2021-03-15 LAB — HEMOGLOBIN A1C: Hgb A1c MFr Bld: 6.5 % (ref 4.6–6.5)

## 2021-03-15 LAB — LDL CHOLESTEROL, DIRECT: Direct LDL: 90 mg/dL

## 2021-03-15 NOTE — Progress Notes (Signed)
Patient ID: Kerri Mills, female   DOB: 1931-03-04, 87 y.o.   MRN: [id number]   Subjective:    Patient ID: Kerri Mills, female    DOB: 02-Jan-1931, 87 y.o.   MRN: [id number]  HPI This visit occurred during the SARS-CoV-2 public health emergency.  Safety protocols were in place, including screening questions prior to the visit, additional usage of staff PPE, and extensive cleaning of exam room while observing appropriate contact time as indicated for disinfecting solutions.   Patient here for a scheduled follow up. She is here to follow up regarding her atrial fibrillation, hypertension, PVCs, and tachybradycardia syndrome.  She is status post Wilkes dual-chamber pacemaker implanted 01/10/2020.  Saw cardiology 03/05/2021.  At that time reported shortness of breath, worse with climbing stairs.  Echocardiogram 10/21/2019 revealed ejection fraction 55 to 60%, LVH.  Pacemaker checked and functioning appropriately.  Maintained on Eliquis.  No changes made.  Recommended follow-up in 12 months.  She reports that she does walk her dog at [facility].  She does not take the Lasix on a regular basis.  Has noticed some ankle swelling.  Reports if she does a lot of heavy work or walking that she will notice some shortness of breath.  Was instructed by cardiology to take extra Lasix for shortness of breath.  She also saw pulmonary in follow-up on 02/02/2021.  They recommended a follow-up CT scan.  Scheduled for August 2022.  Also had recommended follow-up pulmonary function testing.  This has not been scheduled.  She had a question about getting this ordered.  Reports no significant increased coughing.  No reported acid reflux.  No abdominal pain.  States that 3 weeks ago she had what she called a diverticular flare.  Noticed some bright red blood per rectum then.  It has not reoccurred.  Discussed with her regarding further work-up.  She declines.  She did asked to complete a set of stool cards.  She has noticed some right  knee pain.  Wearing a knee support.  Using icy hot.  Going to physical therapy for neck pain.  Her outside blood pressure cuff runs high.   Past Medical History:  Diagnosis Date   Allergy    Anxiety    Arthritis    Atypical chest pain    a. 10/2018 MV: small, fixed apical defect possibly 2/2 attenuation artifact. No ischemia.  EF 68%.   Clotting disorder ([place])    Colitis    Depression    Diverticulitis 2013   Gastric ulcer    GERD (gastroesophageal reflux disease)    History of echocardiogram    a. 09/2019 Echo: EF 55-60%, mod LVH. Mildly dil LA. Triv MR/TR.   Hypercholesterolemia    Hypertension    Infiltrating lobular carcinoma of left breast 2011   T2,N0, ER: 90%; PR 0%; Her 2 neu not amplified. [facility]).   Melanoma ([place]) 1997   Melanoma in situ of upper extremity ([place]) 03/19/2011   Persistent atrial fibrillation ([place])    a. CHADS2VASc => 4 (HTN, age x 2, female)   Personal history of radiation therapy 2011   BREAST CA   Seroma    HISTORY OF LFT BREAST   Sleep apnea    Thyroid cancer ([place]) 1992   Past Surgical History:  Procedure Laterality Date   ABDOMINAL HYSTERECTOMY  1973   partial   BREAST BIOPSY Left 02-13-13   BENIGN BREAST TISSUE WITH CHANGES CONSISTENT WITH FAT NECROSIS   BREAST BIOPSY  Left 01/21/2015   bx done in [facility] office 11:00 left 6-8cmfn   BREAST EXCISIONAL BIOPSY Left 1995   neg   BREAST EXCISIONAL BIOPSY Left 2011   Breast cancer radiation   BREAST LUMPECTOMY Left 2011   BREAST CA   CARDIAC CATHETERIZATION     CHOLECYSTECTOMY     COLONOSCOPY  2013   MELANOMA EXCISION     RT UPPER ARM   PACEMAKER IMPLANT N/A 01/10/2020   Procedure: PACEMAKER IMPLANT;  Surgeon: Constance Haw, MD;  Location: [name];  Service: Cardiovascular;  Laterality: N/A;   PARTIAL HYSTERECTOMY     bleeding, ovaries in place.     THYROID SURGERY  1992   FOR THYROID CANCER   TONSILLECTOMY     Family History  Problem Relation Age of Onset    Heart disease Mother    Cancer Brother        lung    Cancer Sister        breast   Breast cancer Neg Hx    Social History   Socioeconomic History   Marital status: Widowed    Spouse name: Not on file   Number of children: Not on file   Years of education: Not on file   Highest education level: Not on file  Occupational History   Not on file  Tobacco Use   Smoking status: Never   Smokeless tobacco: Never  Substance and Sexual Activity   Alcohol use: Yes    Alcohol/week: 0.0 standard drinks    Comment: social drinking. average times a week   Drug use: No   Sexual activity: Never  Other Topics Concern   Not on file  Social History Narrative   Independent and baseline. Lives by herself   Social Determinants of Health   Financial Resource Strain: Medium Risk   Difficulty of Paying Living Expenses: Somewhat hard  Food Insecurity: No Food Insecurity   Worried About Charity fundraiser in the Last Year: Never true   Ran Out of Food in the Last Year: Never true  Transportation Needs: No Transportation Needs   Lack of Transportation (Medical): No   Lack of Transportation (Non-Medical): No  Physical Activity: Sufficiently Active   Days of Exercise per Week: 4 days   Minutes of Exercise per Session: 40 min  Stress: No Stress Concern Present   Feeling of Stress : Not at all  Social Connections: Unknown   Frequency of Communication with Friends and Family: More than three times a week   Frequency of Social Gatherings with Friends and Family: More than three times a week   Attends Religious Services: Not on file   Active Member of Clubs or Organizations: Not on file   Attends Archivist Meetings: Not on file   Marital Status: Widowed    Outpatient Encounter Medications as of 03/15/2021  Medication Sig   cholecalciferol (VITAMIN D3) 25 MCG (1000 UNIT) tablet Take 1,000 Units by mouth every other day.   DULoxetine (CYMBALTA) 60 MG capsule Take 1 capsule (60 mg  total) by mouth at bedtime.   ELIQUIS 5 MG TABS tablet TAKE 1 TABLET BY MOUTH TWICE A DAY   gabapentin (NEURONTIN) 300 MG capsule TAKE 2 CAPSULES BY MOUTH AT BEDTIME   levothyroxine (SYNTHROID) 100 MCG tablet TAKE 1 TABLET EVERY DAY ON EMPTY STOMACHWITH A GLASS OF WATER AT LEAST 30-60 MINBEFORE BREAKFAST   lovastatin (MEVACOR) 40 MG tablet TAKE 1 TABLET BY MOUTH EVERY DAY  metoprolol tartrate (LOPRESSOR) 50 MG tablet Take 1 tablet (50 mg total) by mouth 2 (two) times daily.   Multiple Vitamins-Minerals (PRESERVISION AREDS 2) CAPS Take 1 tablet by mouth 2 (two) times daily.   omeprazole (PRILOSEC) 20 MG capsule TAKE 1 CAPSULE BY MOUTH ONCE DAILY   Polyethyl Glycol-Propyl Glycol (SYSTANE OP) Place 1 drop into both eyes daily as needed (for dry eyes).   polyethylene glycol powder (GLYCOLAX/MIRALAX) 17 GM/SCOOP powder Take 17 g by mouth daily.   White Petrolatum-Mineral Oil (GENTEAL TEARS NIGHT-TIME OP) Place 1 application into both eyes at bedtime. Night time ointment 3.5g   [DISCONTINUED] furosemide (LASIX) 20 MG tablet Take 1 tablet (20 mg total) by mouth daily as needed. Take with potassium (Patient not taking: Reported on 03/17/2021)   [DISCONTINUED] potassium chloride (KLOR-CON) 10 MEQ tablet Take 2 tablets as needed when taking Lasix (Patient not taking: Reported on 03/17/2021)   No facility-administered encounter medications on file as of 03/15/2021.     Review of Systems  Constitutional:  Negative for appetite change and unexpected weight change.  HENT:  Negative for congestion.   Respiratory:  Negative for chest tightness.        No increased cough.  Some sob as outlined.    Cardiovascular:  Negative for chest pain and palpitations.       Reports some leg swelling as outlined.    Gastrointestinal:  Negative for abdominal pain, nausea and vomiting.       Previous BRB noticed.  No further bleeding.    Genitourinary:  Negative for difficulty urinating and dysuria.  Musculoskeletal:   Negative for arthralgias and gait problem.  Neurological:  Positive for headaches. Negative for dizziness and light-headedness.  Psychiatric/Behavioral:  Negative for agitation and dysphoric mood.       Objective:    Physical Exam Vitals reviewed.  Constitutional:      General: She is not in acute distress.    Appearance: Normal appearance.  HENT:     Head: Normocephalic and atraumatic.     Right Ear: External ear normal.     Left Ear: External ear normal.  Eyes:     General: No scleral icterus.       Right eye: No discharge.        Left eye: No discharge.     Conjunctiva/sclera: Conjunctivae normal.  Neck:     Thyroid: No thyromegaly.  Cardiovascular:     Rate and Rhythm: Normal rate and regular rhythm.  Pulmonary:     Effort: No respiratory distress.     Breath sounds: Normal breath sounds. No wheezing.  Abdominal:     General: Bowel sounds are normal.     Palpations: Abdomen is soft.     Tenderness: There is no abdominal tenderness.  Musculoskeletal:        General: No tenderness.     Cervical back: Neck supple. No tenderness.     Comments: No increased lower extremity swelling.   Lymphadenopathy:     Cervical: No cervical adenopathy.  Skin:    Findings: No erythema or rash.  Neurological:     Mental Status: She is alert.  Psychiatric:        Mood and Affect: Mood normal.        Behavior: Behavior normal.    BP 128/76   Pulse 66   Temp 97.6 F (36.4 C)   Resp 16   Ht 5' 5"  (1.651 m)   Wt 175 lb 9.6 oz (79.7 kg)  SpO2 97%   BMI 29.22 kg/m  Wt Readings from Last 3 Encounters:  03/15/21 175 lb 9.6 oz (79.7 kg)  03/05/21 175 lb 9.6 oz (79.7 kg)  02/03/21 176 lb (79.8 kg)     Lab Results  Component Value Date   WBC 5.4 03/15/2021   HGB 12.9 03/15/2021   HCT 39.3 03/15/2021   PLT 196.0 03/15/2021   GLUCOSE 107 (H) 03/15/2021   CHOL 163 03/15/2021   TRIG 210.0 (H) 03/15/2021   HDL 35.50 (L) 03/15/2021   LDLDIRECT 90.0 03/15/2021   LDLCALC 86  06/02/2020   ALT 10 03/15/2021   AST 14 03/15/2021   NA 140 03/15/2021   K 4.3 03/15/2021   CL 103 03/15/2021   CREATININE 0.81 03/15/2021   BUN 14 03/15/2021   CO2 29 03/15/2021   TSH 2.71 03/15/2021   INR 1.5 (H) 11/21/2018   HGBA1C 6.5 03/15/2021    MM 3D SCREEN BREAST BILATERAL  Result Date: 12/23/2020 CLINICAL DATA:  Screening. EXAM: DIGITAL SCREENING BILATERAL MAMMOGRAM WITH TOMOSYNTHESIS AND CAD TECHNIQUE: Bilateral screening digital craniocaudal and mediolateral oblique mammograms were obtained. Bilateral screening digital breast tomosynthesis was performed. The images were evaluated with computer-aided detection. COMPARISON:  Previous exam(s). ACR Breast Density Category b: There are scattered areas of fibroglandular density. FINDINGS: There are no findings suspicious for malignancy. The images were evaluated with computer-aided detection. IMPRESSION: No mammographic evidence of malignancy. A result letter of this screening mammogram will be mailed directly to the patient. RECOMMENDATION: Screening mammogram in one year. (Code:SM-B-01Y) BI-RADS CATEGORY  1: Negative. Electronically Signed   By: Ammie Ferrier M.D.   On: 12/23/2020 06:29       Assessment & Plan:   Problem List Items Addressed This Visit     Anemia    Follow cbc.        Atrial fibrillation ([place])    Status post pacemaker placement.  On Eliquis and metoprolol.  Reports no increased heart rate or palpitations.  Some lower extremity swelling.  Lasix as needed.  Follow.       BRBPR (bright red blood per rectum)    Discussed with her regarding further work-up for the bright red blood.  She declines at this time.  Did agree to Hemoccult cards.  Check CBC.       Relevant Orders   Fecal occult blood, imunochemical   CBC with Differential/Platelet (Completed)   Essential hypertension    Blood pressures on her cuff are elevated however the blood pressure check here as outlined.  She is on metoprolol.   Continue.  She has Lasix.  Have discussed with her regarding taking this on a more regular basis if needed.  Can take this 2-3 times a week.  Check metabolic panel.  Follow blood pressure.       GERD (gastroesophageal reflux disease)    Controlled on omeprazole.       History of breast cancer    Mammogram 12/21/2020 BI-RADS 1.       History of thyroid cancer    S/p XRT.  On thyroid replacement.  Follow tsh.        Hypercholesterolemia    On lovastatin.  Continue diet and exercise.  Follow fasting profile.       Hyperglycemia    Low carb diet and exercise.  Follow met b and a1c.         Hypothyroid    On thyroid replacement.  Follow TSH.       Mucopurulent  chronic bronchitis ([place])    Has been followed and evaluated by pulmonary.  Is scheduled for follow-up CT scan.  Was supposed to have pulmonary function test.  We will contact pulmonary regarding follow-up.         Neck pain    Neck pain as outlined.  Describes neck pain and tingling sensation - up head.  No severe headache.  Increased pain with rotation of her head - alleviated when looking straight.  No motor weakness reported.  In reviewing, she had CT c-spine that revealed straightening and focal reversal of the normal lordosis centered at C3. Mild anterolisthesis C2 on C3 with notable fusion of the left C2-3 facets.  Disc levels:  Additional mild-to-moderate multilevel cervical spondylitic changes are present most pronounced C5-C7, mild canal stenosis at the C5-6 level. Multilevel uncinate spurring and facet hypertrophic changes result in mild-to-moderate multilevel neural foraminal narrowing without severe foraminal stenosis. Going to PT.         Right knee pain - Primary    Persistent right knee pain.  Will check x-ray.  Further work-up pending results.       Relevant Orders   DG Knee 1-2 Views Right (Completed)   SOB (shortness of breath)    Was evaluated by pulmonary.  Planning for follow-up CT scan.  Was  supposed to have follow-up pulmonary function test.  We will contact pulmonary regarding follow-up.  Continue flutter valve and current inhaler regimen.  With the increase shortness of breath, will discuss with pulmonary regarding further work-up.  Just saw cardiology.  They recommend taking Lasix a little more frequently.  Discussed with her today.       Swelling of lower extremity    Has been evaluated by cardiology and previously evaluated by vascular surgery.  On Lasix.  Rarely takes.  Discussed taking more often as needed as outlined.  Diltiazem previously was changed to metoprolol.  Following.       Tachy-brady syndrome ([place])    Followed by cardiology.  On metoprolol.       Vitamin D deficiency     Einar Pheasant, MD

## 2021-03-17 ENCOUNTER — Telehealth: Payer: Self-pay | Admitting: Internal Medicine

## 2021-03-17 ENCOUNTER — Ambulatory Visit (INDEPENDENT_AMBULATORY_CARE_PROVIDER_SITE_OTHER): Payer: Medicare Other | Admitting: Pharmacist

## 2021-03-17 ENCOUNTER — Encounter: Payer: Self-pay | Admitting: Internal Medicine

## 2021-03-17 DIAGNOSIS — E7800 Pure hypercholesterolemia, unspecified: Secondary | ICD-10-CM | POA: Diagnosis not present

## 2021-03-17 DIAGNOSIS — I4891 Unspecified atrial fibrillation: Secondary | ICD-10-CM | POA: Diagnosis not present

## 2021-03-17 DIAGNOSIS — I10 Essential (primary) hypertension: Secondary | ICD-10-CM

## 2021-03-17 DIAGNOSIS — J411 Mucopurulent chronic bronchitis: Secondary | ICD-10-CM

## 2021-03-17 DIAGNOSIS — M170 Bilateral primary osteoarthritis of knee: Secondary | ICD-10-CM | POA: Diagnosis not present

## 2021-03-17 DIAGNOSIS — R739 Hyperglycemia, unspecified: Secondary | ICD-10-CM

## 2021-03-17 NOTE — Assessment & Plan Note (Addendum)
Neck pain as outlined.  Describes neck pain and tingling sensation - up head.  No severe headache.  Increased pain with rotation of her head - alleviated when looking straight.  No motor weakness reported.  In reviewing, she had CT c-spine that revealed straightening and focal reversal of the normal lordosis centered at C3. Mild anterolisthesis C2 on C3 with notable fusion of the left C2-3 facets.  Disc levels:  Additional mild-to-moderate multilevel cervical spondylitic changes are present most pronounced C5-C7, mild canal stenosis at the C5-6 level. Multilevel uncinate spurring and facet hypertrophic changes result in mild-to-moderate multilevel neural foraminal narrowing without severe foraminal stenosis. Going to PT.

## 2021-03-17 NOTE — Chronic Care Management (AMB) (Signed)
Chronic Care Management Pharmacy Note  03/17/2021 Name:  Kerri Mills MRN:  [id number] DOB:  02/26/1931   Subjective: Kerri Mills is an 87 y.o. year old female who is a primary patient of Einar Pheasant, MD.  The CCM team was consulted for assistance with disease management and care coordination needs.    Engaged with patient by telephone for follow up visit in response to provider referral for pharmacy case management and/or care coordination services.   Consent to Services:  The patient was given information about Chronic Care Management services, agreed to services, and gave verbal consent prior to initiation of services.  Please see initial visit note for detailed documentation.   Patient Care Team: Einar Pheasant, MD as PCP - General (Internal Medicine) Minna Merritts, MD as PCP - Cardiology (Cardiology) Constance Haw, MD as PCP - Electrophysiology (Cardiology) Bary Castilla Forest Gleason, MD (General Surgery) Rocco Serene, MD (Internal Medicine) De Hollingshead, RPH-CPP (Pharmacist) De Hollingshead, RPH-CPP (Pharmacist)  Recent office visits: 4/22 - PCP for neck pain. Recommended scheduled acetaminophen, consider low dose muscle relaxer if no improvement w/ APAP; referral to PM&R 6/20 - PCP - LDL 90, A1c 6.5%, Alk phos elevated but stable, eGFR 65, Vit D low; more SOB and reported high BP in the mornings  Recent consult visits: 4/26 - PM&R Alba Destine- recommend epidural steroid injection. Contacting [facility] for clearance to hold Eliquis (approved); completed 5/5 5/10 - Patsey Berthold pulmonary - scheduled PFT, CT chest 5/11 [facility]ology- metoprolol tartrate 50 mg BID, f/u with EP next week 5/18 and 6/1 - PM&R Morales - occipital nerve block 6/10 - EP Camnitz - continue current regimen 6/15 - PM&R Morales- referral to [facility] for atlantoaxial joint injection  Hospital visits: None in previous 6 months  Objective:  Lab Results  Component Value Date    CREATININE 0.81 03/15/2021   CREATININE 0.92 06/02/2020   CREATININE 0.74 01/10/2020    Lab Results  Component Value Date   HGBA1C 6.5 03/15/2021   Last diabetic Eye exam: No results found for: HMDIABEYEEXA  Last diabetic Foot exam: No results found for: HMDIABFOOTEX      Component Value Date/Time   CHOL 163 03/15/2021 1059   TRIG 210.0 (H) 03/15/2021 1059   HDL 35.50 (L) 03/15/2021 1059   CHOLHDL 5 03/15/2021 1059   VLDL 42.0 (H) 03/15/2021 1059   LDLCALC 86 06/02/2020 0947   LDLDIRECT 90.0 03/15/2021 1059    Hepatic Function Latest Ref Rng & Units 03/15/2021 06/02/2020 06/02/2020  Total Protein 6.0 - 8.3 g/dL 6.2 6.4 6.4  Albumin 3.5 - 5.2 g/dL 3.9 4.0 4.0  AST 0 - 37 U/L 14 14 14   ALT 0 - 35 U/L 10 10 10   Alk Phosphatase 39 - 117 U/L 130(H) 130(H) 130(H)  Total Bilirubin 0.2 - 1.2 mg/dL 0.5 0.4 0.4  Bilirubin, Direct 0.0 - 0.3 mg/dL 0.1 - 0.1    Lab Results  Component Value Date/Time   TSH 2.71 03/15/2021 10:59 AM   TSH 1.24 07/07/2020 12:33 PM    CBC Latest Ref Rng & Units 03/15/2021 01/10/2020 01/09/2020  WBC 4.0 - 10.5 K/uL 5.4 6.7 6.6  Hemoglobin 12.0 - 15.0 g/dL 12.9 13.0 12.2  Hematocrit 36.0 - 46.0 % 39.3 40.6 37.7  Platelets 150.0 - 400.0 K/uL 196.0 210 202    Lab Results  Component Value Date/Time   VD25OH 20.63 (L) 03/15/2021 10:59 AM   VD25OH 28.49 (L) 07/19/2018 11:27 AM  Clinical ASCVD: No  The ASCVD Risk score Mikey Bussing DC Jr., et al., 2013) failed to calculate for the following reasons:   The 2013 ASCVD risk score is only valid for ages 60 to 47    Social History   Tobacco Use  Smoking Status Never  Smokeless Tobacco Never   BP Readings from Last 3 Encounters:  03/15/21 128/76  03/05/21 120/72  02/03/21 126/80   Pulse Readings from Last 3 Encounters:  03/15/21 66  03/05/21 72  02/03/21 60   Wt Readings from Last 3 Encounters:  03/15/21 175 lb 9.6 oz (79.7 kg)  03/05/21 175 lb 9.6 oz (79.7 kg)  02/03/21 176 lb (79.8 kg)     Assessment: Review of patient past medical history, allergies, medications, health status, including review of consultants reports, laboratory and other test data, was performed as part of comprehensive evaluation and provision of chronic care management services.   SDOH:  (Social Determinants of Health) assessments and interventions performed: none   CCM Care Plan  Allergies  Allergen Reactions   Atorvastatin Other (See Comments)    Other reaction(s): Other (See Comments) STIFFNESS AND SORE STIFFNESS AND SORE   Lipitor [Atorvastatin Calcium] Other (See Comments)    Stiffness & soreness   Penicillins Rash    REACTION: Unknown reaction    Medications Reviewed Today     Reviewed by De Hollingshead, RPH-CPP (Pharmacist) on 03/17/21 at 1131  Med List Status: <None>   Medication Order Taking? Sig Documenting Provider Last Dose Status Informant  acetaminophen (TYLENOL) 650 MG CR tablet [id number] Yes Take 650 mg by mouth every 8 (eight) hours as needed for pain. [provider] Taking Active   cholecalciferol (VITAMIN D3) 25 MCG (1000 UNIT) tablet [id number] Yes Take 1,000 Units by mouth every other day. [provider] Taking Active   DULoxetine (CYMBALTA) 60 MG capsule [id number] Yes Take 1 capsule (60 mg total) by mouth at bedtime. Einar Pheasant, MD Taking Active   ELIQUIS 5 MG TABS tablet [id number] Yes TAKE 1 TABLET BY MOUTH TWICE A DAY Camnitz, Will Hassell Done, MD Taking Active   furosemide (LASIX) 20 MG tablet [id number] No Take 1 tablet (20 mg total) by mouth daily as needed. Take with potassium  Patient not taking: Reported on 03/17/2021   Minna Merritts, MD Not Taking Active            Med Note Riley Churches Feb 03, 2021 10:59 AM)    gabapentin (NEURONTIN) 300 MG capsule [id number] Yes TAKE 2 CAPSULES BY MOUTH AT BEDTIME Einar Pheasant, MD Taking Active   levothyroxine (SYNTHROID) 100 MCG tablet [id number] Yes TAKE 1 TABLET EVERY DAY ON EMPTY  STOMACHWITH A GLASS OF WATER AT LEAST 30-60 MINBEFORE Laveda Norman, MD Taking Active   lovastatin (MEVACOR) 40 MG tablet [id number] Yes TAKE 1 TABLET BY MOUTH EVERY DAY Crecencio Mc, MD Taking Active   metoprolol tartrate (LOPRESSOR) 50 MG tablet [id number] Yes Take 1 tablet (50 mg total) by mouth 2 (two) times daily. Minna Merritts, MD Taking Active   Multiple Vitamins-Minerals (PRESERVISION AREDS 2) CAPS [id number] Yes Take 1 tablet by mouth 2 (two) times daily. [provider] Taking Active Multiple Informants  omeprazole (PRILOSEC) 20 MG capsule [id number] Yes TAKE 1 CAPSULE BY MOUTH ONCE DAILY Einar Pheasant, MD Taking Active   Polyethyl Glycol-Propyl Glycol (SYSTANE OP) [id number]  Place 1 drop into both eyes daily as needed (for dry eyes). [provider]  Active Multiple  Informants  polyethylene glycol powder (GLYCOLAX/MIRALAX) 17 GM/SCOOP powder [id number]  Take 17 g by mouth daily. Einar Pheasant, MD  Active   potassium chloride (KLOR-CON) 10 MEQ tablet [id number] No Take 2 tablets as needed when taking Lasix  Patient not taking: Reported on 03/17/2021   [provider] Not Taking Active   White Petrolatum-Mineral Oil (GENTEAL TEARS NIGHT-TIME OP) [id number]  Place 1 application into both eyes at bedtime. Night time ointment 3.5g [provider]  Active Multiple Informants            Patient Active Problem List   Diagnosis Date Noted   BRBPR (bright red blood per rectum) 03/15/2021   Neck pain 01/16/2021   UTI (urinary tract infection) 09/21/2020   Constipation 09/21/2020   Back skin lesion 07/12/2020   Vitamin D deficiency 07/12/2020   Hypothyroid 07/07/2020   Mucosal irritation of oral cavity 03/02/2020   Cheek biting 03/02/2020   Anxiety 03/02/2020   LBBB (left bundle branch block) 01/11/2020   Symptomatic bradycardia 01/11/2020   Tachy-brady syndrome (HCC)    Heart block 01/10/2020   Syncope 01/08/2020   Mucopurulent  chronic bronchitis ([place]) 11/02/2019   Frequent PVCs 10/20/2019   Left hip pain 06/06/2019   Encounter for completion of form with patient 03/03/2019   Abnormal chest CT 12/22/2018   SOB (shortness of breath) 11/25/2018   Varicose veins of both lower extremities with inflammation 09/21/2018   Swelling of lower extremity 08/13/2018   Right knee pain 05/30/2018   Fall 05/30/2018   Sprain of knee 05/30/2018   Abrasion, knee 05/30/2018   Osteoarthritis of knee 05/30/2018   Anemia 02/15/2018   Bilateral knee pain 11/12/2017   Atrial fibrillation with rapid ventricular response ([place])    Demand ischemia ([place])    Essential hypertension    Chest pain 09/29/2017   Dermatitis 09/26/2017   Osteoarthritis of both knees 06/12/2017   Osteoporosis, senile 06/12/2017   Right leg swelling 05/25/2017   Joint ache 05/25/2017   Hypercholesterolemia 02/17/2017   Cough 01/23/2017   Hyperglycemia 06/18/2016   Abnormal mammogram of left breast 01/21/2015   Knee pain, left 11/19/2014   Diverticulosis of colon without hemorrhage 05/07/2014   Diverticulosis of large intestine 05/07/2014   History of breast cancer 01/20/2014   Stress 06/15/2013   Obstructive sleep apnea 06/15/2013   Atrial fibrillation ([place]) 06/15/2013   Posttraumatic hematoma of left breast 02/19/2013   Lump or mass in breast 02/19/2013   History of thyroid cancer 03/19/2011   GERD (gastroesophageal reflux disease)    GERD 05/26/2009    Immunization History  Administered Date(s) Administered   Fluad Quad(high Dose 65+) 06/06/2019, 07/07/2020   Influenza Split 07/18/2014   Influenza, High Dose Seasonal PF 07/01/2015, 06/13/2016, 05/23/2017, 06/25/2018   Influenza,inj,Quad PF,6+ Mos 06/11/2013   Moderna Sars-Covid-2 Vaccination 10/08/2019, 11/05/2019, 07/24/2020   Pneumococcal Conjugate-13 12/18/2013   Pneumococcal Polysaccharide-23 07/01/2015   Tdap 11/15/2016   Zoster Recombinat (Shingrix) 06/25/2018, 07/13/2018, 11/07/2018     Conditions to be addressed/monitored: Atrial Fibrillation, HLD, and hypothyroidism  Care Plan : Medication Management  Updates made by De Hollingshead, RPH-CPP since 03/17/2021 12:00 AM     Problem: Afib, Hypothyroidism, Tremors      Long-Range Goal: Medication Management   Start Date: 10/30/2020  This Visit's Progress: On track  Recent Progress: On track  Priority: High  Note:   Current Barriers:  Unable to independently afford treatment regimen  Pharmacist Clinical Goal(s):  Over the next 90 days, patient will  verbalize ability to afford treatment regimen Over the next 90 days, patient will achieve adherence to monitoring guidelines and medication adherence to achieve therapeutic efficacy through collaboration with PharmD and provider.   Interventions: 1:1 collaboration with Einar Pheasant, MD regarding development and update of comprehensive plan of care as evidenced by provider attestation and co-signature Inter-disciplinary care team collaboration (see longitudinal plan of care) Comprehensive medication review performed; medication list updated in electronic medical record  Health Maintenance: Lives in [place]. Has a dog, Precious. Walks B[place]. Lots of friends.  Atrial Fibrillation: Unclear control; current rate control: metoprolol 50 mg BID; anticoagulant treatment: Eliquis 5 mg BID; furosemide 20 mg PRN w/ potassium 20 mEq - takes infrequently, reports this is because she "has so many other pills"; follows w/ Dr. Rockey Situ, Dr. Curt Bears- pacemaker in place Eliquis is now covered by [employer]. W/ commercial insurance copay card copay is $10 per month.  Reports that home BP is ~180-190 when she wakes up in the morning, but is "better" later in the evening, more in SBP 120-130s. BP was normal at most recent office visit w/ PCP on Monday. Denies chest pain, and notes that it is hard to differentiate if a headache is related to her chronic neck pain vs elevated BP. Does note  worsening SOB, difficult to ascertain for how long it has been worse. Reports the yellow sputum that she coughs up, but also indicates a feeling of "being smothered" when she lays down at night. Notes that her ankles are often swollen, but again, difficult to ascertain if this is new or worsened.  Discussed using furosemide (+ potassium) more frequently, starting with 2-3 times weekly. Renal function and potassium from Monday within normal limits and appropriate to increase use of diuretic. Patient is agreeable to this. Discussed w/ PCP Dr. Nicki Reaper regarding follow up plans.  SOB and Cough: Patient concerned, reports chronic productive cough with yellow sputum. Has followed w/ Dr. Mortimer Fries, seen Dr. Patsey Berthold more recently; using CPAP; OTC Mucinex, occasional albuterol HFA PRN CT scan of the chest performed 11/03/20 that showed findings consistent with bronchiectasis, some mild groundglass and possible MAI- per Dr. Domingo Dimes last note Plan for chest Xray (scheduled 05/04/21) PFTs scheduled to evaluate.  SOB management as above. Follow up with pulmonary as scheduled.   Mood, Chronic Pain, Tremors: Reports mood is moderately well controlled. Current treatment: duloxetine 60 mg daily, gabapentin 600 mg QPM. Working extensively with [facility] PM&R. Has received epidural injection, occipital nerve injections without significant relief. Referral in place to [facility] for atlantoaxial joint injection Continue current regimen at this time along w/ f/u with PM&R recommendations  Hyperlipidemia: Controlled per last lab report; current regimen: lovastatin 40 mg daily Reviewed labwork with patient today Continue current regimen at this time  Hypothyroidism: Controlled per last lab work; current regimen: levothyroxine 100 mcg daily Reviewed labwork with patient Continue current regimen at this time  GERD: Controlled per patient report; current regimen: omeprazole 20 mg daily  Continue  current regimen at this time, along with avoidance of trigger foods   Supplements: Vitamin D 1000 units every other day, MVI, daily Align probiotic.  Patient Goals/Self-Care Activities Over the next 90 days, patient will:  - take medications as prescribed collaborate with provider on medication access solutions  Follow Up Plan: Telephone follow up appointment with care management team member scheduled for: ~ 3 weeks      Medication Assistance: None required.  Patient affirms current coverage meets needs.  Patient's preferred  pharmacy is:  [place], [place] - [address] [place] [address] Phone: [phone] Fax: [phone]  [id number] [id number]: [phone]: [phone]  Follow Up:  Patient agrees to Care Plan and Follow-up.  Plan: Telephone follow up appointment with care management team member scheduled for:  ~4 weeks  [name] TDarnelle Maffucci PharmD, BBur[place] CWaunetaClinical Pharmacist LOccidental [facility] [phone]

## 2021-03-17 NOTE — Patient Instructions (Signed)
Visit Information  PATIENT GOALS:  Goals Addressed               This Visit's Progress     Patient Stated     Medication Monitoring (pt-stated)        Patient Goals/Self-Care Activities Over the next 90 days, patient will:  - take medications as prescribed collaborate with provider on medication access solutions         Patient verbalizes understanding of instructions provided today and agrees to view in [place].   Plan: Telephone follow up appointment with care management team member scheduled for:  ~4 weeks  [name], PharmD, [place], [place] Clinical Pharmacist [employer] at [employer] [phone]

## 2021-03-17 NOTE — Assessment & Plan Note (Signed)
Has been evaluated by cardiology and previously evaluated by vascular surgery.  On Lasix.  Rarely takes.  Discussed taking more often as needed as outlined.  Diltiazem previously was changed to metoprolol.  Following.

## 2021-03-17 NOTE — Telephone Encounter (Signed)
Per review of pulmonary note, Ms Kerri Mills was supposed to be scheduled for PFTs. Pt was questioning getting these scheduled.   Please call  Pulmonary and see if we can get these scheduled.

## 2021-03-17 NOTE — Assessment & Plan Note (Signed)
Low carb diet and exercise.  Follow met b and a1c.   

## 2021-03-17 NOTE — Assessment & Plan Note (Signed)
Discussed with her regarding further work-up for the bright red blood.  She declines at this time.  Did agree to Hemoccult cards.  Check CBC.

## 2021-03-17 NOTE — Assessment & Plan Note (Signed)
Was evaluated by pulmonary.  Planning for follow-up CT scan.  Was supposed to have follow-up pulmonary function test.  We will contact pulmonary regarding follow-up.  Continue flutter valve and current inhaler regimen.  With the increase shortness of breath, will discuss with pulmonary regarding further work-up.  Just saw cardiology.  They recommend taking Lasix a little more frequently.  Discussed with her today.

## 2021-03-17 NOTE — Assessment & Plan Note (Signed)
Has been followed and evaluated by pulmonary.  Is scheduled for follow-up CT scan.  Was supposed to have pulmonary function test.  We will contact pulmonary regarding follow-up.

## 2021-03-17 NOTE — Assessment & Plan Note (Addendum)
Mammogram 12/21/2020 BI-RADS 1.

## 2021-03-17 NOTE — Assessment & Plan Note (Signed)
Followed by cardiology.  On metoprolol. 

## 2021-03-17 NOTE — Assessment & Plan Note (Signed)
On thyroid replacement.  Follow TSH 

## 2021-03-17 NOTE — Assessment & Plan Note (Signed)
Persistent right knee pain.  Will check x-ray.  Further work-up pending results.

## 2021-03-17 NOTE — Assessment & Plan Note (Signed)
Controlled on omeprazole.   

## 2021-03-17 NOTE — Assessment & Plan Note (Signed)
S/p XRT.  On thyroid replacement.  Follow tsh.

## 2021-03-17 NOTE — Assessment & Plan Note (Signed)
On lovastatin.  Continue diet and exercise.  Follow fasting profile.

## 2021-03-17 NOTE — Assessment & Plan Note (Signed)
Follow cbc.  

## 2021-03-17 NOTE — Assessment & Plan Note (Signed)
Blood pressures on her cuff are elevated however the blood pressure check here as outlined.  She is on metoprolol.  Continue.  She has Lasix.  Have discussed with her regarding taking this on a more regular basis if needed.  Can take this 2-3 times a week.  Check metabolic panel.  Follow blood pressure.

## 2021-03-17 NOTE — Assessment & Plan Note (Signed)
Status post pacemaker placement.  On Eliquis and metoprolol.  Reports no increased heart rate or palpitations.  Some lower extremity swelling.  Lasix as needed.  Follow.

## 2021-03-18 ENCOUNTER — Other Ambulatory Visit (INDEPENDENT_AMBULATORY_CARE_PROVIDER_SITE_OTHER): Payer: Medicare Other

## 2021-03-18 DIAGNOSIS — K625 Hemorrhage of anus and rectum: Secondary | ICD-10-CM

## 2021-03-18 LAB — FECAL OCCULT BLOOD, IMMUNOCHEMICAL: Fecal Occult Bld: POSITIVE — AB

## 2021-03-19 NOTE — Progress Notes (Unsigned)
Order placed for PT referral.  

## 2021-03-20 ENCOUNTER — Other Ambulatory Visit: Payer: Self-pay | Admitting: Internal Medicine

## 2021-03-20 DIAGNOSIS — K625 Hemorrhage of anus and rectum: Secondary | ICD-10-CM

## 2021-03-20 DIAGNOSIS — R195 Other fecal abnormalities: Secondary | ICD-10-CM

## 2021-03-20 NOTE — Progress Notes (Signed)
Order placed for GI referral.   

## 2021-03-22 ENCOUNTER — Telehealth: Payer: Self-pay | Admitting: Internal Medicine

## 2021-03-23 NOTE — Telephone Encounter (Signed)
Patient calling wanting to know if she should get her 2nd covid booster this week. We are also trying to get her set up for PFTs. Are you ok with her getting the booster or would you prefer her to wait until after she has her PFTs any further testing that is needed?

## 2021-03-24 NOTE — Telephone Encounter (Signed)
Patient stated that she is doing ok but she is still having the SOB on exertion. She says it is not worse but it is about the same. Confirmed no new acute symptoms. Still trying to get scheduled for PFTs. Per pulmonary, there has been a wait list. She says she does not necessarily want to take the 4th shot. Just didn't know if she needed it. The other 3 vaccines that she had was Moderna. The retirement community she lives in has someone coming out to give the vaccine and they will be Editor, commissioning. She wanted me to note this as well just as an fyi.

## 2021-03-24 NOTE — Telephone Encounter (Signed)
See other phone note. Working on getting these scheduled.

## 2021-03-24 NOTE — Telephone Encounter (Signed)
There is no contraindication to her getting the second booster and having PFTs.  I do need to know how she is doing.  How is her breathing, etc.

## 2021-03-25 NOTE — Telephone Encounter (Signed)
Please confirm if she is taking lasix.  If so, how often.  We had discussed taking prn.  Does she feel the sob is worsening?  Also, does she have a f/u appt with cardiology or pulmonary scheduled?

## 2021-03-26 NOTE — Telephone Encounter (Signed)
Breathing is stable and is not worsening

## 2021-03-26 NOTE — Telephone Encounter (Signed)
Thank you for the update.  When you talked to her, did she say anything about if her breathing was stable, etc?  Just need to make sure she is not having worsening sob, etc.

## 2021-03-26 NOTE — Telephone Encounter (Signed)
Patient is taking her lasix about every third day. She is scheduled to see Patsey Berthold on 8/5 but no appt scheduled with cardiology. She also sees GI 8/24

## 2021-03-31 DIAGNOSIS — R2681 Unsteadiness on feet: Secondary | ICD-10-CM | POA: Diagnosis not present

## 2021-03-31 DIAGNOSIS — R2689 Other abnormalities of gait and mobility: Secondary | ICD-10-CM | POA: Diagnosis not present

## 2021-03-31 DIAGNOSIS — M1711 Unilateral primary osteoarthritis, right knee: Secondary | ICD-10-CM | POA: Diagnosis not present

## 2021-04-02 DIAGNOSIS — M1711 Unilateral primary osteoarthritis, right knee: Secondary | ICD-10-CM | POA: Diagnosis not present

## 2021-04-02 DIAGNOSIS — R2689 Other abnormalities of gait and mobility: Secondary | ICD-10-CM | POA: Diagnosis not present

## 2021-04-02 DIAGNOSIS — R2681 Unsteadiness on feet: Secondary | ICD-10-CM | POA: Diagnosis not present

## 2021-04-05 DIAGNOSIS — M1711 Unilateral primary osteoarthritis, right knee: Secondary | ICD-10-CM | POA: Diagnosis not present

## 2021-04-05 DIAGNOSIS — R2689 Other abnormalities of gait and mobility: Secondary | ICD-10-CM | POA: Diagnosis not present

## 2021-04-05 DIAGNOSIS — R2681 Unsteadiness on feet: Secondary | ICD-10-CM | POA: Diagnosis not present

## 2021-04-06 DIAGNOSIS — R2681 Unsteadiness on feet: Secondary | ICD-10-CM | POA: Diagnosis not present

## 2021-04-06 DIAGNOSIS — R2689 Other abnormalities of gait and mobility: Secondary | ICD-10-CM | POA: Diagnosis not present

## 2021-04-06 DIAGNOSIS — M1711 Unilateral primary osteoarthritis, right knee: Secondary | ICD-10-CM | POA: Diagnosis not present

## 2021-04-08 ENCOUNTER — Telehealth: Payer: Medicare Other

## 2021-04-08 ENCOUNTER — Telehealth: Payer: Self-pay | Admitting: Internal Medicine

## 2021-04-08 ENCOUNTER — Ambulatory Visit (INDEPENDENT_AMBULATORY_CARE_PROVIDER_SITE_OTHER): Payer: Medicare Other | Admitting: Pharmacist

## 2021-04-08 DIAGNOSIS — I10 Essential (primary) hypertension: Secondary | ICD-10-CM | POA: Diagnosis not present

## 2021-04-08 DIAGNOSIS — J411 Mucopurulent chronic bronchitis: Secondary | ICD-10-CM | POA: Diagnosis not present

## 2021-04-08 DIAGNOSIS — E039 Hypothyroidism, unspecified: Secondary | ICD-10-CM | POA: Diagnosis not present

## 2021-04-08 DIAGNOSIS — R2681 Unsteadiness on feet: Secondary | ICD-10-CM | POA: Diagnosis not present

## 2021-04-08 DIAGNOSIS — E7800 Pure hypercholesterolemia, unspecified: Secondary | ICD-10-CM | POA: Diagnosis not present

## 2021-04-08 DIAGNOSIS — M1711 Unilateral primary osteoarthritis, right knee: Secondary | ICD-10-CM | POA: Diagnosis not present

## 2021-04-08 DIAGNOSIS — R2689 Other abnormalities of gait and mobility: Secondary | ICD-10-CM | POA: Diagnosis not present

## 2021-04-08 DIAGNOSIS — I4891 Unspecified atrial fibrillation: Secondary | ICD-10-CM | POA: Diagnosis not present

## 2021-04-08 NOTE — Telephone Encounter (Signed)
Called and LVM for patient to call back. Kerri Mills,cma

## 2021-04-08 NOTE — Patient Instructions (Signed)
Ms. Kerri Mills,   It was great talking to you today!  Keep taking furosemide and potassium three days weekly. Focus on staying well hydrated - fill up a 32 oz water bottle or cup and focus on drinking one before lunch, and one before supper. Keep track of the days that you have more shortness of breath or swelling, and if there is any pattern of if those are the days you don't take furosemide.   Keep up the great work and call us with any questions or concerns!  [name], PharmD [phone]  Visit Information  PATIENT GOALS:  Goals Addressed               This Visit's Progress     Patient Stated     Medication Monitoring (pt-stated)        Patient Goals/Self-Care Activities Over the next 90 days, patient will:  - take medications as prescribed collaborate with provider on medication access solutions         The patient verbalized understanding of instructions, educational materials, and care plan provided today and agreed to receive a mailed copy of patient instructions, educational materials, and care plan.   Plan: Telephone follow up appointment with care management team member scheduled for:  ~ 8 weeks  [name], PharmD, [place], [place] Clinical Pharmacist [employer] at [employer] [phone]

## 2021-04-08 NOTE — Telephone Encounter (Signed)
Patient called back and she stated her BP is elevated only in the mornings and she only has SOB with exertion.  She also wanted to know if you could prescribe Voltaren Gel for her knee pain.  And she has an appointment with pulmonary next month and she also has a CT of her chest scheduled.  She does not feel  like she needs an earlier appointment.  Megyn Leng,cma

## 2021-04-08 NOTE — Telephone Encounter (Signed)
LMTCB

## 2021-04-08 NOTE — Chronic Care Management (AMB) (Signed)
Chronic Care Management Pharmacy Note  04/08/2021 Name:  Kerri Mills MRN:  [id number] DOB:  Feb 05, 1931  Subjective: Kerri Mills is an 87 y.o. year old female who is a primary patient of Einar Pheasant, MD.  The CCM team was consulted for assistance with disease management and care coordination needs.    Engaged with patient by telephone for follow up visit in response to provider referral for pharmacy case management and/or care coordination services.   Consent to Services:  The patient was given information about Chronic Care Management services, agreed to services, and gave verbal consent prior to initiation of services.  Please see initial visit note for detailed documentation.   Patient Care Team: Einar Pheasant, MD as PCP - General (Internal Medicine) Minna Merritts, MD as PCP - Cardiology (Cardiology) Constance Haw, MD as PCP - Electrophysiology (Cardiology) Bary Castilla, Forest Gleason, MD (General Surgery) Rocco Serene, MD (Internal Medicine) De Hollingshead, RPH-CPP (Pharmacist)  Objective:  Lab Results  Component Value Date   CREATININE 0.81 03/15/2021   CREATININE 0.92 06/02/2020   CREATININE 0.74 01/10/2020    Lab Results  Component Value Date   HGBA1C 6.5 03/15/2021   Last diabetic Eye exam: No results found for: HMDIABEYEEXA  Last diabetic Foot exam: No results found for: HMDIABFOOTEX      Component Value Date/Time   CHOL 163 03/15/2021 1059   TRIG 210.0 (H) 03/15/2021 1059   HDL 35.50 (L) 03/15/2021 1059   CHOLHDL 5 03/15/2021 1059   VLDL 42.0 (H) 03/15/2021 1059   Ramirez-Perez 86 06/02/2020 0947   LDLDIRECT 90.0 03/15/2021 1059    Hepatic Function Latest Ref Rng & Units 03/15/2021 06/02/2020 06/02/2020  Total Protein 6.0 - 8.3 g/dL 6.2 6.4 6.4  Albumin 3.5 - 5.2 g/dL 3.9 4.0 4.0  AST 0 - 37 U/L 14 14 14   ALT 0 - 35 U/L 10 10 10   Alk Phosphatase 39 - 117 U/L 130(H) 130(H) 130(H)  Total Bilirubin 0.2 - 1.2 mg/dL 0.5 0.4 0.4  Bilirubin, Direct  0.0 - 0.3 mg/dL 0.1 - 0.1    Lab Results  Component Value Date/Time   TSH 2.71 03/15/2021 10:59 AM   TSH 1.24 07/07/2020 12:33 PM    CBC Latest Ref Rng & Units 03/15/2021 01/10/2020 01/09/2020  WBC 4.0 - 10.5 K/uL 5.4 6.7 6.6  Hemoglobin 12.0 - 15.0 g/dL 12.9 13.0 12.2  Hematocrit 36.0 - 46.0 % 39.3 40.6 37.7  Platelets 150.0 - 400.0 K/uL 196.0 210 202    Lab Results  Component Value Date/Time   VD25OH 20.63 (L) 03/15/2021 10:59 AM   VD25OH 28.49 (L) 07/19/2018 11:27 AM    Clinical ASCVD: No  The ASCVD Risk score Mikey Bussing DC Jr., et al., 2013) failed to calculate for the following reasons:   The 2013 ASCVD risk score is only valid for ages 53 to 68     CHA2DS2-VASc Score = 4  This indicates a 4.8% annual risk of stroke. The patient's score is based upon: CHF History: No HTN History: Yes Diabetes History: No Stroke History: No Vascular Disease History: No Age Score: 2 Gender Score: 1   Social History   Tobacco Use  Smoking Status Never  Smokeless Tobacco Never   BP Readings from Last 3 Encounters:  03/15/21 128/76  03/05/21 120/72  02/03/21 126/80   Pulse Readings from Last 3 Encounters:  03/15/21 66  03/05/21 72  02/03/21 60   Wt Readings from Last 3 Encounters:  03/15/21  175 lb 9.6 oz (79.7 kg)  03/05/21 175 lb 9.6 oz (79.7 kg)  02/03/21 176 lb (79.8 kg)    Assessment: Review of patient past medical history, allergies, medications, health status, including review of consultants reports, laboratory and other test data, was performed as part of comprehensive evaluation and provision of chronic care management services.   SDOH:  (Social Determinants of Health) assessments and interventions performed:  SDOH Interventions    Flowsheet Row Most Recent Value  SDOH Interventions   Financial Strain Interventions Intervention Not Indicated  Stress Interventions Provide Counseling       CCM Care Plan  Allergies  Allergen Reactions   Atorvastatin Other  (See Comments)    Other reaction(s): Other (See Comments) STIFFNESS AND SORE STIFFNESS AND SORE   Lipitor [Atorvastatin Calcium] Other (See Comments)    Stiffness & soreness   Penicillins Rash    REACTION: Unknown reaction    Medications Reviewed Today     Reviewed by De Hollingshead, RPH-CPP (Pharmacist) on 04/08/21 at [facility] List Status: <None>   Medication Order Taking? Sig Documenting Provider Last Dose Status Informant  acetaminophen (TYLENOL) 650 MG CR tablet [id number]  Take 650 mg by mouth every 8 (eight) hours as needed for pain. [provider]  Active   cholecalciferol (VITAMIN D3) 25 MCG (1000 UNIT) tablet [id number] Yes Take 1,000 Units by mouth every other day. [provider] Taking Active   DULoxetine (CYMBALTA) 60 MG capsule [id number] Yes Take 1 capsule (60 mg total) by mouth at bedtime. Einar Pheasant, MD Taking Active   ELIQUIS 5 MG TABS tablet [id number] Yes TAKE 1 TABLET BY MOUTH TWICE A DAY Camnitz, Will Hassell Done, MD Taking Active   furosemide (LASIX) 20 MG tablet [id number] Yes Take 20 mg by mouth 3 (three) times a week. [provider] Taking Active            Med Note Nat Christen Apr 08, 2021  9:19 AM)    gabapentin (NEURONTIN) 300 MG capsule [id number] Yes TAKE 2 CAPSULES BY MOUTH AT BEDTIME Einar Pheasant, MD Taking Active   levothyroxine (SYNTHROID) 100 MCG tablet [id number] Yes TAKE 1 TABLET EVERY DAY ON EMPTY STOMACHWITH A GLASS OF WATER AT LEAST 30-60 MINBEFORE Laveda Norman, MD Taking Active   lovastatin (MEVACOR) 40 MG tablet [id number] Yes TAKE 1 TABLET BY MOUTH EVERY DAY Crecencio Mc, MD Taking Active   metoprolol tartrate (LOPRESSOR) 50 MG tablet [id number] Yes Take 1 tablet (50 mg total) by mouth 2 (two) times daily. Minna Merritts, MD Taking Active   Multiple Vitamins-Minerals (PRESERVISION AREDS 2) CAPS [id number] Yes Take 1 tablet by mouth 2 (two) times daily. [provider] Taking  Active Multiple Informants  omeprazole (PRILOSEC) 20 MG capsule [id number] Yes TAKE 1 CAPSULE BY MOUTH ONCE DAILY Einar Pheasant, MD Taking Active   Polyethyl Glycol-Propyl Glycol (SYSTANE OP) [id number] Yes Place 1 drop into both eyes daily as needed (for dry eyes). [provider] Taking Active Multiple Informants  polyethylene glycol powder (GLYCOLAX/MIRALAX) 17 GM/SCOOP powder [id number]  Take 17 g by mouth daily. Einar Pheasant, MD  Active   potassium chloride SA (KLOR-CON) 20 MEQ tablet [id number] Yes Take 20 mEq by mouth 3 (three) times a week. [provider] Taking Active   White Petrolatum-Mineral Oil (GENTEAL TEARS NIGHT-TIME OP) [id number]  Place 1 application into both eyes at bedtime. Night time ointment 3.5g [provider]  Active Multiple Informants  Patient Active Problem List   Diagnosis Date Noted   BRBPR (bright red blood per rectum) 03/15/2021   Neck pain 01/16/2021   UTI (urinary tract infection) 09/21/2020   Constipation 09/21/2020   Back skin lesion 07/12/2020   Vitamin D deficiency 07/12/2020   Hypothyroid 07/07/2020   Mucosal irritation of oral cavity 03/02/2020   Cheek biting 03/02/2020   Anxiety 03/02/2020   LBBB (left bundle branch block) 01/11/2020   Symptomatic bradycardia 01/11/2020   Tachy-brady syndrome (HCC)    Heart block 01/10/2020   Syncope 01/08/2020   Mucopurulent chronic bronchitis ([place]) 11/02/2019   Frequent PVCs 10/20/2019   Left hip pain 06/06/2019   Encounter for completion of form with patient 03/03/2019   Abnormal chest CT 12/22/2018   SOB (shortness of breath) 11/25/2018   Varicose veins of both lower extremities with inflammation 09/21/2018   Swelling of lower extremity 08/13/2018   Right knee pain 05/30/2018   Fall 05/30/2018   Sprain of knee 05/30/2018   Abrasion, knee 05/30/2018   Osteoarthritis of knee 05/30/2018   Anemia 02/15/2018   Bilateral knee pain 11/12/2017   Atrial  fibrillation with rapid ventricular response ([place])    Demand ischemia ([place])    Essential hypertension    Chest pain 09/29/2017   Dermatitis 09/26/2017   Osteoarthritis of both knees 06/12/2017   Osteoporosis, senile 06/12/2017   Right leg swelling 05/25/2017   Joint ache 05/25/2017   Hypercholesterolemia 02/17/2017   Cough 01/23/2017   Hyperglycemia 06/18/2016   Abnormal mammogram of left breast 01/21/2015   Knee pain, left 11/19/2014   Diverticulosis of colon without hemorrhage 05/07/2014   Diverticulosis of large intestine 05/07/2014   History of breast cancer 01/20/2014   Stress 06/15/2013   Obstructive sleep apnea 06/15/2013   Atrial fibrillation ([place]) 06/15/2013   Posttraumatic hematoma of left breast 02/19/2013   Lump or mass in breast 02/19/2013   History of thyroid cancer 03/19/2011   GERD (gastroesophageal reflux disease)    GERD 05/26/2009    Immunization History  Administered Date(s) Administered   Fluad Quad(high Dose 65+) 06/06/2019, 07/07/2020   Influenza Split 07/18/2014   Influenza, High Dose Seasonal PF 07/01/2015, 06/13/2016, 05/23/2017, 06/25/2018   Influenza,inj,Quad PF,6+ Mos 06/11/2013   Moderna Sars-Covid-2 Vaccination 10/08/2019, 11/05/2019, 07/24/2020   Pneumococcal Conjugate-13 12/18/2013   Pneumococcal Polysaccharide-23 07/01/2015   Tdap 11/15/2016   Zoster Recombinat (Shingrix) 06/25/2018, 07/13/2018, 11/07/2018    Conditions to be addressed/monitored: Atrial Fibrillation, HLD, and Anxiety  Care Plan : Medication Management  Updates made by De Hollingshead, RPH-CPP since 04/08/2021 12:00 AM     Problem: Afib, Hypothyroidism, Tremors      Long-Range Goal: Medication Management   Start Date: 10/30/2020  This Visit's Progress: On track  Recent Progress: On track  Priority: High  Note:   Current Barriers:  Unable to independently afford treatment regimen  Pharmacist Clinical Goal(s):  Over the next 90 days, patient will verbalize  ability to afford treatment regimen Over the next 90 days, patient will achieve adherence to monitoring guidelines and medication adherence to achieve therapeutic efficacy through collaboration with PharmD and provider.   Interventions: 1:1 collaboration with Einar Pheasant, MD regarding development and update of comprehensive plan of care as evidenced by provider attestation and co-signature Inter-disciplinary care team collaboration (see longitudinal plan of care) Comprehensive medication review performed; medication list updated in electronic medical record  SDOH: Lives in [place]. Has a dog, Precious. Walks twice daily. Lots of friends. Reports she has been  doing PT three days per week for knee pain, though still reports pain.  Atrial Fibrillation: Moderately well controlled; current rate control: metoprolol tartrate 50 mg BID; anticoagulant treatment: Eliquis 5 mg BID; furosemide 20 mg PRN w/ potassium 20 mEq - taking three days weekly; follows w/ Dr. Rockey Situ, Dr. Curt Bears- pacemaker in place Eliquis is now covered by [employer]. W/ commercial insurance copay card copay is $10 per month.  Home BP readings: reports she is "rushing around" in the mornings, takes BP on Monday, Wednesday, Friday mornings and remembers a reading of 179/97, took again, and was still 170s/90s. However, is not necessarily waiting at least 5 minutes before checking. She is helping a neighbor with Lovenox injections and compression hose and worries about making it over there in time. Likely that this is impacting BP. Later in the day readings are better controlled, sits for 5 minutes before.  Notes some improvement in SOB, improvement in the feeling of "being smothered" since increasing furosemide dose. She reports the days she still has symptoms may be the days that she doesn't take furosemide, but she isn't sure.  Advised to continue furosemide + potassium three days weekly and follow up with Dr. Nicki Reaper in a few weeks,  consider BMP. Consider (pending BMP results) increasing to daily administration. Discussed importance of still focusing on adequate hydration. Discussed filling up a water bottle and drinking one before lunch, one before supper. She notes she will work on this.   SOB and Cough: Uncontrolled, continues to report chronic productive cough with yellow sputum every morning. Has followed w/ Dr. Mortimer Fries, seen Dr. Patsey Berthold more recently; using CPAP; OTC Mucinex, occasional albuterol HFA PRN Does report some improvement in SOB with increasing furosemide frequency. Difficult to tell any change in the yellow sputum. CT scan of the chest performed 11/03/20 that showed findings consistent with bronchiectasis, some mild groundglass and possible MAI- per Dr. Domingo Dimes last note Plan for chest Xray (scheduled 05/04/21), PFTs ordered to evaluate per pulmonary.  Recommended to continue current regimen, follow up with pulmonary as scheduled.   Mood, Chronic Pain, Tremors: Reports mood is moderately well controlled. Current treatment: duloxetine 60 mg daily, gabapentin 600 mg QPM. Working extensively with [facility] PM&R. Has received epidural injection, occipital nerve injections without significant relief. Plans for [facility] for atlantoaxial joint injection on August 10th. Some stress due to helping her neighbor with Lovenox injections and compression hose, laundry. She notes that next week, she is going to stop doing this and has asked her neighbor to find someone else.  Recommended to continue current regimen at this time along w/ f/u with PM&R recommendations. Discussed importance of focusing on her health first above others.  Hyperlipidemia: Controlled per last lab report; current regimen: lovastatin 40 mg daily Previously recommended to continue current regimen at this time  Hypothyroidism: Controlled per last lab work; current regimen: levothyroxine 100 mcg daily Confirmed appropriate  administration Previously recommended to continue current regimen at this time  GERD, BRBPR: Controlled per patient report; current regimen: omeprazole 20 mg daily; upcoming appointment with GI Dr. Vicente Males for BRBPR Continue current regimen at this time, along with avoidance of trigger foods. Keep GI appointment  Supplements: Vitamin D 1000 units every other day, MVI, daily Align probiotic.  Patient Goals/Self-Care Activities Over the next 90 days, patient will:  - take medications as prescribed collaborate with provider on medication access solutions  Follow Up Plan: Telephone follow up appointment with care management team member scheduled for: ~ 8  weeks      Medication Assistance: None required.  Patient affirms current coverage meets needs.  Patient's preferred pharmacy is:  [facility] - [place], [place] - [address] [place] [address] Phone: [phone] Fax: [phone]  [id number] [id number]: [phone]: [phone]  Follow Up:  Patient agrees to Care Plan and Follow-up.  Plan: Telephone follow up appointment with care management team member scheduled for:  ~ 8 weeks  [name] TDarnelle Maffucci PharmD, BCaban CBobtownClinical Pharmacist LOccidental Petroleumat BJohnson [employer] [phone]

## 2021-04-08 NOTE — Telephone Encounter (Signed)
Received notification from [name] that Kerri Mills was having issues with elevated blood pressure.  See if she has any recent readings.  Will need her to spot check and send in readings.  Also, was still having some sob - improved with three times per week lasix.  Please confirm if any acute symptoms and if needs seen earlier or acutely.

## 2021-04-09 NOTE — Telephone Encounter (Signed)
Have her try tylenol scheduled and see if improvement.  Can d/w her more at appt regarding topical voltaren gel.

## 2021-04-09 NOTE — Telephone Encounter (Signed)
Patient is aware of below and will try tylenol until appt.

## 2021-04-11 DIAGNOSIS — M1711 Unilateral primary osteoarthritis, right knee: Secondary | ICD-10-CM | POA: Diagnosis not present

## 2021-04-11 DIAGNOSIS — R2689 Other abnormalities of gait and mobility: Secondary | ICD-10-CM | POA: Diagnosis not present

## 2021-04-11 DIAGNOSIS — R2681 Unsteadiness on feet: Secondary | ICD-10-CM | POA: Diagnosis not present

## 2021-04-12 ENCOUNTER — Telehealth: Payer: Self-pay | Admitting: Pulmonary Disease

## 2021-04-12 ENCOUNTER — Telehealth: Payer: Self-pay

## 2021-04-12 ENCOUNTER — Other Ambulatory Visit: Payer: Self-pay | Admitting: Cardiovascular Disease

## 2021-04-12 NOTE — Telephone Encounter (Signed)
Symptoms started Friday. Tested positive 7/17. Multiple people in her community have tested positive.   Symptoms are runny nose, cough, sore throat, hoarse, fatigue, chills. She has been getting up and moving around some through out the day to move around a little bit to try to prevent her from getting pneumonia. No increased sob, chest tightness, etc. Has not checked temp because she cannot find her thermometer. She is taking liquid mucinex. She has been eating but appetite is decreased. She has been drinking plenty of fluid. She says she is doing ok but was advised by the [facility] community to let her PCP know so she was calling. She is also going to let her pulmonologist know as well. Patient did not want to do a virtual visit at this time.

## 2021-04-12 NOTE — Telephone Encounter (Signed)
Pt called and wanted Dr Nicki Reaper to know that she tested positive for covid yesterday. Symptoms started Friday evening. She states that she did not even turn on the tv all weekend. She does feel somewhat better. She wants you to call back and talk with her. FYI

## 2021-04-12 NOTE — Telephone Encounter (Signed)
Spoke to [place], LPN with Dr. Bary Leriche office. Dr. Nicki Reaper would like for patient to have PFT''s.  Patient is scheduled to see Dr. Patsey Berthold on 04/30/2021, however Patient is currently positive for covid therefore covid test can not be preformed prior to appt.  Larena Glassman is aware that PFT can be performed 90 days after positive test. Appt note has been updated for reminder.  Nothing further needed at this time.

## 2021-04-13 NOTE — Telephone Encounter (Signed)
Thanks for letting me know.  Forwarding to Dr Nicki Reaper as Juluis Rainier

## 2021-04-13 NOTE — Telephone Encounter (Signed)
Please call Kerri Mills and confirm she is doing ok.  I know that she did not want a visit, but need to confirm ok and see if she has changed her mind about being evaluated.  Also, need to know if she is taking anything (for example, robitussin, mucinex, nasacort, etc).

## 2021-04-14 ENCOUNTER — Telehealth: Payer: Self-pay | Admitting: Internal Medicine

## 2021-04-14 DIAGNOSIS — M1711 Unilateral primary osteoarthritis, right knee: Secondary | ICD-10-CM | POA: Diagnosis not present

## 2021-04-14 DIAGNOSIS — R2689 Other abnormalities of gait and mobility: Secondary | ICD-10-CM | POA: Diagnosis not present

## 2021-04-14 DIAGNOSIS — R2681 Unsteadiness on feet: Secondary | ICD-10-CM | POA: Diagnosis not present

## 2021-04-14 NOTE — Telephone Encounter (Signed)
-----   Message from Lars Masson, LPN sent at 9/37/1696  1:49 PM EDT ----- Regarding: pulmonary You had sent me a message prior to me being out of the office about getting with Pulmonary to get her PFTs scheduled. I talked with Margie over at Pulmonary and since patient has tested positive for COVID, she will have to wait 90 days to have her PFTs done. She will be ok to see Dr Patsey Berthold in office for her appointment on 8/5 as long as she does not have to be admitted to the hospital. They will work with her during that appointment to get her PFTs scheduled. I just wanted to give you an update and let you know that they are working on this.

## 2021-04-14 NOTE — Telephone Encounter (Signed)
Patient says that she is still not feeling great but she is feeling better than she was. She is using liquid mucinex but nothing else. Still does not feel like she needs to be evaluated. Her symptoms started on 7/15 and she says she has continued to get better each day.

## 2021-04-16 DIAGNOSIS — R2689 Other abnormalities of gait and mobility: Secondary | ICD-10-CM | POA: Diagnosis not present

## 2021-04-16 DIAGNOSIS — R2681 Unsteadiness on feet: Secondary | ICD-10-CM | POA: Diagnosis not present

## 2021-04-16 DIAGNOSIS — M1711 Unilateral primary osteoarthritis, right knee: Secondary | ICD-10-CM | POA: Diagnosis not present

## 2021-04-16 NOTE — Telephone Encounter (Signed)
Noted.  Confirmed no worsening sob.  Agree with need for evaluation if any change or worsening symptoms.

## 2021-04-16 NOTE — Telephone Encounter (Signed)
Pt would like to talk to you about how she is doing.

## 2021-04-16 NOTE — Telephone Encounter (Signed)
Patient called to give me an update. Still continuing to feel better. Eating and drinking. Continuing mucinex. The only symptom that she is having is a productive cough. She has been coughing up a lot of phlegm and wanted to make sure this is ok. Advised that the mucinex is breaking up the phlegm so she can cough it up. Patient is still in quarantine and did not feel that she needs a visit. Advised if the productive cough does not continue to get better she will need to be evaluated. Also advised that PCP is out of from this PM and will not return until Tuesday so if symptoms persist or worsen she will need to be seen more acutely over the weekend instead of waiting for next week. Patient agreed.

## 2021-04-19 ENCOUNTER — Telehealth: Payer: Self-pay | Admitting: Pulmonary Disease

## 2021-04-19 DIAGNOSIS — R2689 Other abnormalities of gait and mobility: Secondary | ICD-10-CM | POA: Diagnosis not present

## 2021-04-19 DIAGNOSIS — R2681 Unsteadiness on feet: Secondary | ICD-10-CM | POA: Diagnosis not present

## 2021-04-19 DIAGNOSIS — M1711 Unilateral primary osteoarthritis, right knee: Secondary | ICD-10-CM | POA: Diagnosis not present

## 2021-04-19 MED ORDER — AZITHROMYCIN 250 MG PO TABS: ORAL_TABLET | ORAL | 0 refills | Status: AC

## 2021-04-19 MED ORDER — PREDNISONE 10 MG (21) PO TBPK: ORAL_TABLET | ORAL | 0 refills | Status: DC

## 2021-04-19 NOTE — Telephone Encounter (Signed)
Patient is aware of below message/recommendations and voiced her understanding.  Prednisone and zpak has been sent to preferred pharmacy.  Nothing further needed at this time.

## 2021-04-19 NOTE — Telephone Encounter (Signed)
Yes, unless patient is having any new or worsening sx.  Let me know when PFT is scheduled and we will contact patient to arrange ROV.

## 2021-04-19 NOTE — Telephone Encounter (Signed)
Per Dr. Maryagnes Amos OV until after PFT.  Rodena Piety, let me know when PFT has been rescheduled.

## 2021-04-19 NOTE — Telephone Encounter (Signed)
Spoke to patient, who reports of prod cough with green sputum, sob with exertion and wheezing mainly in the morning x1w Denied fever, chills or sweats or additional sx.  Recently dx with 1 week ago.  She is taking mucinex daily with some relief in sx.  She doesn't wear supplemental oxygen. Spo2 is maintaining around 90%.  Patient is scheduled for Ov on 04/30/2021, however PFT's were ordered to be completed prior ROV. Patient can not have PFT's at this time due to recent covid.   Dr. Patsey Berthold, please advise on sx. Should patient keep scheduled ov or recall until after PFT?

## 2021-04-19 NOTE — Telephone Encounter (Signed)
Okay to reschedule PFT.  We will see her after the PFT.  For now recommend prednisone taper pack and Azithromycin Z-Pak.  Also recommend an albuterol inhaler for as needed use.  2 puffs every 6 as needed.  Continue monitoring oxygen saturations and do deep breathing exercises periodically.  She should come to the ED if her saturations are 89% or below.

## 2021-04-19 NOTE — Telephone Encounter (Signed)
Patient has an appt with Dr. Patsey Berthold on 04/30/21 and when I was trying to get her PFT schedule before the appt she told me she was positive for Covid on 04/09/21. They stated it has to be 20+ days after to get PFT scheduled which would be after her appt. Should we try and reschedule her appt with Dr. Patsey Berthold

## 2021-04-20 ENCOUNTER — Ambulatory Visit (INDEPENDENT_AMBULATORY_CARE_PROVIDER_SITE_OTHER): Payer: Medicare Other

## 2021-04-20 DIAGNOSIS — I495 Sick sinus syndrome: Secondary | ICD-10-CM | POA: Diagnosis not present

## 2021-04-20 LAB — CUP PACEART REMOTE DEVICE CHECK
Battery Remaining Longevity: 95 mo
Battery Remaining Percentage: 90 %
Battery Voltage: 3.01 V
Brady Statistic AP VP Percent: 1.7 %
Brady Statistic AP VS Percent: 90 %
Brady Statistic AS VP Percent: 1 %
Brady Statistic AS VS Percent: 5.9 %
Brady Statistic RA Percent Paced: 87 %
Brady Statistic RV Percent Paced: 1.8 %
Date Time Interrogation Session: 20220726040015
Implantable Lead Implant Date: 20210416
Implantable Lead Implant Date: 20210416
Implantable Lead Location: 753859
Implantable Lead Location: 753860
Implantable Pulse Generator Implant Date: 20210416
Lead Channel Impedance Value: 350 Ohm
Lead Channel Impedance Value: 490 Ohm
Lead Channel Pacing Threshold Amplitude: 0.75 V
Lead Channel Pacing Threshold Amplitude: 1 V
Lead Channel Pacing Threshold Pulse Width: 0.5 ms
Lead Channel Pacing Threshold Pulse Width: 0.5 ms
Lead Channel Sensing Intrinsic Amplitude: 1.4 mV
Lead Channel Sensing Intrinsic Amplitude: 11.2 mV
Lead Channel Setting Pacing Amplitude: 2 V
Lead Channel Setting Pacing Amplitude: 2.5 V
Lead Channel Setting Pacing Pulse Width: 0.5 ms
Lead Channel Setting Sensing Sensitivity: 2 mV
Pulse Gen Model: 2272
Pulse Gen Serial Number: 3813093

## 2021-04-21 DIAGNOSIS — M1711 Unilateral primary osteoarthritis, right knee: Secondary | ICD-10-CM | POA: Diagnosis not present

## 2021-04-21 DIAGNOSIS — R2689 Other abnormalities of gait and mobility: Secondary | ICD-10-CM | POA: Diagnosis not present

## 2021-04-21 DIAGNOSIS — R2681 Unsteadiness on feet: Secondary | ICD-10-CM | POA: Diagnosis not present

## 2021-04-21 NOTE — Telephone Encounter (Signed)
Kerri Mills's PFT has been scheduled on 05/04/21 @ 1:00pm and she is aware

## 2021-04-21 NOTE — Telephone Encounter (Signed)
I have left a message for Mrs. Bostrom to call me back about scheduling her PFT appt.

## 2021-04-21 NOTE — Telephone Encounter (Signed)
Ov scheduled for 05/10/2021 at 2:00. Patient is aware and voiced her understanding. Nothing further needed at this time.

## 2021-04-22 DIAGNOSIS — R2681 Unsteadiness on feet: Secondary | ICD-10-CM | POA: Diagnosis not present

## 2021-04-22 DIAGNOSIS — R2689 Other abnormalities of gait and mobility: Secondary | ICD-10-CM | POA: Diagnosis not present

## 2021-04-22 DIAGNOSIS — M1711 Unilateral primary osteoarthritis, right knee: Secondary | ICD-10-CM | POA: Diagnosis not present

## 2021-04-23 ENCOUNTER — Other Ambulatory Visit: Payer: Self-pay | Admitting: Cardiovascular Disease

## 2021-04-25 DIAGNOSIS — M1711 Unilateral primary osteoarthritis, right knee: Secondary | ICD-10-CM | POA: Diagnosis not present

## 2021-04-25 DIAGNOSIS — R2689 Other abnormalities of gait and mobility: Secondary | ICD-10-CM | POA: Diagnosis not present

## 2021-04-25 DIAGNOSIS — R2681 Unsteadiness on feet: Secondary | ICD-10-CM | POA: Diagnosis not present

## 2021-04-26 DIAGNOSIS — R2689 Other abnormalities of gait and mobility: Secondary | ICD-10-CM | POA: Diagnosis not present

## 2021-04-26 DIAGNOSIS — R2681 Unsteadiness on feet: Secondary | ICD-10-CM | POA: Diagnosis not present

## 2021-04-26 DIAGNOSIS — M1711 Unilateral primary osteoarthritis, right knee: Secondary | ICD-10-CM | POA: Diagnosis not present

## 2021-04-27 ENCOUNTER — Encounter: Payer: Self-pay | Admitting: Internal Medicine

## 2021-04-27 ENCOUNTER — Other Ambulatory Visit: Payer: Self-pay

## 2021-04-27 ENCOUNTER — Ambulatory Visit (INDEPENDENT_AMBULATORY_CARE_PROVIDER_SITE_OTHER): Payer: Medicare Other | Admitting: Internal Medicine

## 2021-04-27 DIAGNOSIS — E7800 Pure hypercholesterolemia, unspecified: Secondary | ICD-10-CM

## 2021-04-27 DIAGNOSIS — I495 Sick sinus syndrome: Secondary | ICD-10-CM | POA: Diagnosis not present

## 2021-04-27 DIAGNOSIS — E039 Hypothyroidism, unspecified: Secondary | ICD-10-CM | POA: Diagnosis not present

## 2021-04-27 DIAGNOSIS — Z85850 Personal history of malignant neoplasm of thyroid: Secondary | ICD-10-CM

## 2021-04-27 DIAGNOSIS — I4891 Unspecified atrial fibrillation: Secondary | ICD-10-CM

## 2021-04-27 DIAGNOSIS — F419 Anxiety disorder, unspecified: Secondary | ICD-10-CM

## 2021-04-27 DIAGNOSIS — R739 Hyperglycemia, unspecified: Secondary | ICD-10-CM | POA: Diagnosis not present

## 2021-04-27 DIAGNOSIS — K219 Gastro-esophageal reflux disease without esophagitis: Secondary | ICD-10-CM | POA: Diagnosis not present

## 2021-04-27 DIAGNOSIS — R5383 Other fatigue: Secondary | ICD-10-CM | POA: Diagnosis not present

## 2021-04-27 DIAGNOSIS — R0602 Shortness of breath: Secondary | ICD-10-CM

## 2021-04-27 DIAGNOSIS — J411 Mucopurulent chronic bronchitis: Secondary | ICD-10-CM | POA: Diagnosis not present

## 2021-04-27 DIAGNOSIS — R0981 Nasal congestion: Secondary | ICD-10-CM

## 2021-04-27 DIAGNOSIS — I10 Essential (primary) hypertension: Secondary | ICD-10-CM | POA: Diagnosis not present

## 2021-04-27 DIAGNOSIS — M7989 Other specified soft tissue disorders: Secondary | ICD-10-CM

## 2021-04-27 DIAGNOSIS — F439 Reaction to severe stress, unspecified: Secondary | ICD-10-CM

## 2021-04-27 LAB — COMPREHENSIVE METABOLIC PANEL
ALT: 22 U/L (ref 0–35)
AST: 16 U/L (ref 0–37)
Albumin: 3.8 g/dL (ref 3.5–5.2)
Alkaline Phosphatase: 123 U/L — ABNORMAL HIGH (ref 39–117)
BUN: 14 mg/dL (ref 6–23)
CO2: 31 mEq/L (ref 19–32)
Calcium: 9.6 mg/dL (ref 8.4–10.5)
Chloride: 101 mEq/L (ref 96–112)
Creatinine, Ser: 0.84 mg/dL (ref 0.40–1.20)
GFR: 62.3 mL/min (ref >60.00)
Glucose, Bld: 105 mg/dL — ABNORMAL HIGH (ref 70–99)
Potassium: 4.3 mEq/L (ref 3.5–5.1)
Sodium: 140 mEq/L (ref 135–145)
Total Bilirubin: 0.5 mg/dL (ref 0.2–1.2)
Total Protein: 6.3 g/dL (ref 6.0–8.3)

## 2021-04-27 LAB — CBC WITH DIFFERENTIAL/PLATELET
Basophils Absolute: 0.1 10*3/uL (ref 0.0–0.1)
Basophils Relative: 0.6 % (ref 0.0–3.0)
Eosinophils Absolute: 0.2 10*3/uL (ref 0.0–0.7)
Eosinophils Relative: 1.8 % (ref 0.0–5.0)
HCT: 41.2 % (ref 36.0–46.0)
Hemoglobin: 13.3 g/dL (ref 12.0–15.0)
Lymphocytes Relative: 14.5 % (ref 12.0–46.0)
Lymphs Abs: 1.2 10*3/uL (ref 0.7–4.0)
MCHC: 32.4 g/dL (ref 30.0–36.0)
MCV: 92.9 fl (ref 78.0–100.0)
Monocytes Absolute: 0.7 10*3/uL (ref 0.1–1.0)
Monocytes Relative: 8.6 % (ref 3.0–12.0)
Neutro Abs: 6.4 10*3/uL (ref 1.4–7.7)
Neutrophils Relative %: 74.5 % (ref 43.0–77.0)
Platelets: 262 10*3/uL (ref 150.0–400.0)
RBC: 4.44 Mil/uL (ref 3.87–5.11)
RDW: 13.2 % (ref 11.5–15.5)
WBC: 8.6 10*3/uL (ref 4.0–10.5)

## 2021-04-27 LAB — VITAMIN B12: Vitamin B-12: 270 pg/mL (ref 211–911)

## 2021-04-27 NOTE — Progress Notes (Signed)
Patient ID: Kerri Mills, female   DOB: Mar 07, 1931, 87 y.o.   MRN: [id number]   Subjective:    Patient ID: Kerri Mills, female    DOB: 1931-06-10, 87 y.o.   MRN: [id number]  HPI This visit occurred during the SARS-CoV-2 public health emergency.  Safety protocols were in place, including screening questions prior to the visit, additional usage of staff PPE, and extensive cleaning of exam room while observing appropriate contact time as indicated for disinfecting solutions.   Patient here for a scheduled follow up. Recently diagnosed with covid.  Tested positive 04/11/21.  Symptoms began 04/09/21.  Symptoms of runny nose, cough, sore throat, hoarseness, fatigue and chills.  She denied any increase shortness of breath or chest tightness.  Appetite was decreased initially.  She declined need for visit at that time.  Use liquid Mucinex.  She reported that her symptoms had improved however she continued to have productive cough with colored mucus.  Also noticed some wheezing in the mornings with some shortness of breath with exertion.  She spoke to pulmonary.  Was prescribed a Z-Pak as well as a prednisone taper.  After taking these medications she did feel better.  Still with some decreased energy and fatigue but overall symptoms improved.  Has noticed at times fungal trembly inside.  States the therapist checked her heart rate on 1 occasion and it was less than 50.  States subsequent checks have been okay.  No chest pain.  Again still has the shortness of breath with exertion but feels her breathing is basically back to her baseline prior to COVID.  Swelling is better with compression hose.  Weight is staying stable.  States her weight at home is measured around 172 to 173 pounds.  She is trying to watch her diet.  Has decreased her sweets.  Is scheduled for follow-up CT scan and pulmonary function testing.  Eating better.  No nausea or vomiting currently.  Again overall feels better.   Past Medical History:   Diagnosis Date   Allergy    Anxiety    Arthritis    Atypical chest pain    a. 10/2018 MV: small, fixed apical defect possibly 2/2 attenuation artifact. No ischemia.  EF 68%.   Clotting disorder ([place])    Colitis    Depression    Diverticulitis 2013   Gastric ulcer    GERD (gastroesophageal reflux disease)    History of echocardiogram    a. 09/2019 Echo: EF 55-60%, mod LVH. Mildly dil LA. Triv MR/TR.   Hypercholesterolemia    Hypertension    Infiltrating lobular carcinoma of left breast 2011   T2,N0, ER: 90%; PR 0%; Her 2 neu not amplified. [facility]).   Melanoma ([place]) 1997   Melanoma in situ of upper extremity ([place]) 03/19/2011   Persistent atrial fibrillation ([place])    a. CHADS2VASc => 4 (HTN, age x 2, female)   Personal history of radiation therapy 2011   BREAST CA   Seroma    HISTORY OF LFT BREAST   Sleep apnea    Thyroid cancer ([place]) 1992   Past Surgical History:  Procedure Laterality Date   ABDOMINAL HYSTERECTOMY  1973   partial   BREAST BIOPSY Left 02-13-13   BENIGN BREAST TISSUE WITH CHANGES CONSISTENT WITH FAT NECROSIS   BREAST BIOPSY Left 01/21/2015   bx done in [place] office 11:00 left 6-8cmfn   BREAST EXCISIONAL BIOPSY Left 1995   neg   BREAST EXCISIONAL BIOPSY Left 2011  Breast cancer radiation   BREAST LUMPECTOMY Left 2011   BREAST CA   CARDIAC CATHETERIZATION     CHOLECYSTECTOMY     COLONOSCOPY  2013   MELANOMA EXCISION     RT UPPER ARM   PACEMAKER IMPLANT N/A 01/10/2020   Procedure: PACEMAKER IMPLANT;  Surgeon: Constance Haw, MD;  Location: [facility];  Service: Cardiovascular;  Laterality: N/A;   PARTIAL HYSTERECTOMY     bleeding, ovaries in place.     THYROID SURGERY  1992   FOR THYROID CANCER   TONSILLECTOMY     Family History  Problem Relation Age of Onset   Heart disease Mother    Cancer Brother        lung    Cancer Sister        breast   Breast cancer Neg Hx    Social History   Socioeconomic History    Marital status: Widowed    Spouse name: Not on file   Number of children: Not on file   Years of education: Not on file   Highest education level: Not on file  Occupational History   Not on file  Tobacco Use   Smoking status: Never   Smokeless tobacco: Never  Substance and Sexual Activity   Alcohol use: Yes    Alcohol/week: 0.0 standard drinks    Comment: social drinking. average times a week   Drug use: No   Sexual activity: Never  Other Topics Concern   Not on file  Social History Narrative   Independent and baseline. Lives by herself   Social Determinants of Health   Financial Resource Strain: Low Risk    Difficulty of Paying Living Expenses: Not hard at all  Food Insecurity: No Food Insecurity   Worried About Charity fundraiser in the Last Year: Never true   Arboriculturist in the Last Year: Never true  Transportation Needs: No Transportation Needs   Lack of Transportation (Medical): No   Lack of Transportation (Non-Medical): No  Physical Activity: Sufficiently Active   Days of Exercise per Week: 4 days   Minutes of Exercise per Session: 40 min  Stress: Stress Concern Present   Feeling of Stress : To some extent  Social Connections: Unknown   Frequency of Communication with Friends and Family: More than three times a week   Frequency of Social Gatherings with Friends and Family: More than three times a week   Attends Religious Services: Not on file   Active Member of Clubs or Organizations: Not on file   Attends Archivist Meetings: Not on file   Marital Status: Widowed    Review of Systems  Constitutional:  Positive for fatigue.       Eating better.   HENT:  Negative for sinus pressure.        No increased nasal congestion today.   Respiratory:  Positive for shortness of breath. Negative for cough and chest tightness.   Cardiovascular:  Negative for chest pain and palpitations.       Leg swelling improved with compression hose.    Gastrointestinal:   Negative for abdominal pain, diarrhea, nausea and vomiting.  Genitourinary:  Negative for difficulty urinating and dysuria.  Musculoskeletal:  Negative for joint swelling and myalgias.  Skin:  Negative for color change and rash.  Neurological:  Negative for dizziness, light-headedness and headaches.  Psychiatric/Behavioral:  Negative for agitation and dysphoric mood.       Objective:  Physical Exam Vitals reviewed.  Constitutional:      General: She is not in acute distress.    Appearance: Normal appearance.  HENT:     Head: Normocephalic and atraumatic.     Right Ear: External ear normal.     Left Ear: External ear normal.  Eyes:     General: No scleral icterus.       Right eye: No discharge.        Left eye: No discharge.     Conjunctiva/sclera: Conjunctivae normal.  Neck:     Thyroid: No thyromegaly.  Cardiovascular:     Rate and Rhythm: Normal rate and regular rhythm.  Pulmonary:     Effort: No respiratory distress.     Breath sounds: No wheezing.     Comments: No crackles.  Abdominal:     General: Bowel sounds are normal.     Palpations: Abdomen is soft.     Tenderness: There is no abdominal tenderness.  Musculoskeletal:        General: No tenderness.     Cervical back: Neck supple. No tenderness.     Comments: No increased swelling.   Lymphadenopathy:     Cervical: No cervical adenopathy.  Skin:    Findings: No erythema or rash.  Neurological:     Mental Status: She is alert.  Psychiatric:        Mood and Affect: Mood normal.        Behavior: Behavior normal.    BP 126/78   Pulse 64   Ht 5' 5"  (1.651 m)   Wt 173 lb 12.8 oz (78.8 kg)   SpO2 94%   BMI 28.92 kg/m  Wt Readings from Last 3 Encounters:  04/27/21 173 lb 12.8 oz (78.8 kg)  03/15/21 175 lb 9.6 oz (79.7 kg)  03/05/21 175 lb 9.6 oz (79.7 kg)    Outpatient Encounter Medications as of 04/27/2021  Medication Sig   acetaminophen (TYLENOL) 650 MG CR tablet Take 650 mg by mouth every 8 (eight)  hours as needed for pain.   cholecalciferol (VITAMIN D3) 25 MCG (1000 UNIT) tablet Take 1,000 Units by mouth every other day.   DULoxetine (CYMBALTA) 60 MG capsule Take 1 capsule (60 mg total) by mouth at bedtime.   ELIQUIS 5 MG TABS tablet TAKE 1 TABLET BY MOUTH TWICE A DAY   furosemide (LASIX) 20 MG tablet TAKE 1 TABLET BY MOUTH DAILY AS NEEDED. TAKE WITH POTASSIUM.   gabapentin (NEURONTIN) 300 MG capsule TAKE 2 CAPSULES BY MOUTH AT BEDTIME   levothyroxine (SYNTHROID) 100 MCG tablet TAKE 1 TABLET EVERY DAY ON EMPTY STOMACHWITH A GLASS OF WATER AT LEAST 30-60 MINBEFORE BREAKFAST   lovastatin (MEVACOR) 40 MG tablet TAKE 1 TABLET BY MOUTH EVERY DAY   metoprolol tartrate (LOPRESSOR) 50 MG tablet Take 1 tablet (50 mg total) by mouth 2 (two) times daily.   Multiple Vitamins-Minerals (PRESERVISION AREDS 2) CAPS Take 1 tablet by mouth 2 (two) times daily.   omeprazole (PRILOSEC) 20 MG capsule TAKE 1 CAPSULE BY MOUTH ONCE DAILY   Polyethyl Glycol-Propyl Glycol (SYSTANE OP) Place 1 drop into both eyes daily as needed (for dry eyes).   polyethylene glycol powder (GLYCOLAX/MIRALAX) 17 GM/SCOOP powder Take 17 g by mouth daily.   potassium chloride (KLOR-CON) 10 MEQ tablet TAKE 2 TABLETS BY MOUTH DAILY AS NEEDED.WITH FUROSEMIDE.   potassium chloride SA (KLOR-CON) 20 MEQ tablet Take 20 mEq by mouth 3 (three) times a week.   predniSONE (STERAPRED UNI-PAK 21 TAB)  10 MG (21) TBPK tablet Use as directed.   White Petrolatum-Mineral Oil (GENTEAL TEARS NIGHT-TIME OP) Place 1 application into both eyes at bedtime. Night time ointment 3.5g   No facility-administered encounter medications on file as of 04/27/2021.     Lab Results  Component Value Date   WBC 8.6 04/27/2021   HGB 13.3 04/27/2021   HCT 41.2 04/27/2021   PLT 262.0 04/27/2021   GLUCOSE 105 (H) 04/27/2021   CHOL 163 03/15/2021   TRIG 210.0 (H) 03/15/2021   HDL 35.50 (L) 03/15/2021   LDLDIRECT 90.0 03/15/2021   LDLCALC 86 06/02/2020   ALT 22  04/27/2021   AST 16 04/27/2021   NA 140 04/27/2021   K 4.3 04/27/2021   CL 101 04/27/2021   CREATININE 0.84 04/27/2021   BUN 14 04/27/2021   CO2 31 04/27/2021   TSH 2.71 03/15/2021   INR 1.5 (H) 11/21/2018   HGBA1C 6.5 03/15/2021    MM 3D SCREEN BREAST BILATERAL  Result Date: 12/23/2020 CLINICAL DATA:  Screening. EXAM: DIGITAL SCREENING BILATERAL MAMMOGRAM WITH TOMOSYNTHESIS AND CAD TECHNIQUE: Bilateral screening digital craniocaudal and mediolateral oblique mammograms were obtained. Bilateral screening digital breast tomosynthesis was performed. The images were evaluated with computer-aided detection. COMPARISON:  Previous exam(s). ACR Breast Density Category b: There are scattered areas of fibroglandular density. FINDINGS: There are no findings suspicious for malignancy. The images were evaluated with computer-aided detection. IMPRESSION: No mammographic evidence of malignancy. A result letter of this screening mammogram will be mailed directly to the patient. RECOMMENDATION: Screening mammogram in one year. (Code:[id number]-01Y) BI-RADS CATEGORY  1: Negative. Electronically Signed   By: Ammie Ferrier M.D.   On: 12/23/2020 06:29       Assessment & Plan:   Problem List Items Addressed This Visit     Anxiety    Continue cymbalta.  Follow.  Feeling better.        Atrial fibrillation ([place])    Status post pacemaker placement.  On Eliquis and metoprolol.  Has intermittent episodes of trembling sensation as outlined.  She is not sure if this is related to increased heart rate.  Had the episode of low pulse rate on 1 check by her therapist.  Discussed reevaluation by cardiology.  Discussed possible need for monitor, etc.  Complete pulmonary work-up with follow-up CT scan and pulmonary function testing.       Essential hypertension    On metoprolol and Lasix.  Blood pressure as outlined.  Appears to be doing well currently.  Continue current medication regimen.  Follow pressures.  Follow  metabolic panel.       Fatigue   Relevant Orders   CBC with Differential/Platelet (Completed)   Comprehensive metabolic panel (Completed)   Vitamin B12 (Completed)   GERD (gastroesophageal reflux disease)    Controlled on omeprazole.       History of thyroid cancer    On thyroid replacement.  Follow tsh.        Hypercholesterolemia    On lovastatin.  Continue diet and exercise.  Follow fasting profile and liver panel.        Hyperglycemia    Low carb diet and exercise.  Follow met b and a1c.         Hypothyroid    On thyroid replacement.  Follow TSH.       Mucopurulent chronic bronchitis ([place])    Is followed by pulmonary.  Planning for follow-up CT scan of the chest as outlined.  Recently treated with Z-Pak and prednisone.  Breathing and cough has improved.  Follow.        Nasal congestion    Nasal congestion/drainage.  nasacort nasal spray as directed.  Follow.        SOB (shortness of breath)    Has been evaluated by cardiology.  On Lasix.  Recent COVID infection.  Cough and breathing have improved.  She still reports that shortness of breath with exertion.  Some fatigue.  Discussed with cardiology regarding further work-up as outlined.  She is planned to have follow-up CT scan of her chest and pulmonary function testing to help complete pulmonary work-up.       Stress    Continues on Cymbalta.  Increased stress related to her recent illness with COVID.  She had to stay in.  Overall feeling better.  Follow.       Swelling of lower extremity    Improved with compression hose.  She is taking her Lasix.  Follow.       Tachy-brady syndrome ([place])    Has been followed by cardiology.  Reports some fatigue which may be multifactorial.  She has recently had COVID.  She did notice while working with therapy heart rate down below 50.  She also states she has intermittent episodes where she feels trembling inside.  Not sure if these are episodes of increased heart  rate.  Has been maintained on metoprolol.  Discussed with her regarding reevaluation by cardiology with questionable need for monitoring, etc.  Breathing has improved.  Is planning further pulmonary work-up with follow-up CT scan and pulmonary function testing scheduled in the near future.         Einar Pheasant, MD

## 2021-04-27 NOTE — Patient Instructions (Signed)
Nasacort nasal spray - 2 sprays each nostril one time per day.   

## 2021-04-29 ENCOUNTER — Other Ambulatory Visit: Payer: Self-pay | Admitting: Internal Medicine

## 2021-04-29 ENCOUNTER — Telehealth: Payer: Self-pay

## 2021-04-29 DIAGNOSIS — R2689 Other abnormalities of gait and mobility: Secondary | ICD-10-CM | POA: Diagnosis not present

## 2021-04-29 DIAGNOSIS — R2681 Unsteadiness on feet: Secondary | ICD-10-CM | POA: Diagnosis not present

## 2021-04-29 DIAGNOSIS — R748 Abnormal levels of other serum enzymes: Secondary | ICD-10-CM

## 2021-04-29 DIAGNOSIS — M1711 Unilateral primary osteoarthritis, right knee: Secondary | ICD-10-CM | POA: Diagnosis not present

## 2021-04-29 NOTE — Progress Notes (Signed)
Order placed for f/u labs.  

## 2021-04-29 NOTE — Telephone Encounter (Signed)
Called and spoke with patient in regards to appt with Dr Patsey Berthold tomorrow, appt has been cancelled. Pt states she has upcoming appt with TP after her PFT.

## 2021-04-30 ENCOUNTER — Ambulatory Visit: Payer: Medicare Other | Admitting: Pulmonary Disease

## 2021-05-02 ENCOUNTER — Encounter: Payer: Self-pay | Admitting: Internal Medicine

## 2021-05-02 DIAGNOSIS — R0981 Nasal congestion: Secondary | ICD-10-CM | POA: Insufficient documentation

## 2021-05-02 NOTE — Assessment & Plan Note (Signed)
On metoprolol and Lasix.  Blood pressure as outlined.  Appears to be doing well currently.  Continue current medication regimen.  Follow pressures.  Follow metabolic panel.

## 2021-05-02 NOTE — Assessment & Plan Note (Signed)
Controlled on omeprazole.   

## 2021-05-02 NOTE — Assessment & Plan Note (Signed)
Nasal congestion/drainage.  nasacort nasal spray as directed.  Follow.

## 2021-05-02 NOTE — Assessment & Plan Note (Signed)
Continue cymbalta.  Follow.  Feeling better.

## 2021-05-02 NOTE — Assessment & Plan Note (Addendum)
On lovastatin.  Continue diet and exercise.  Follow fasting profile and liver panel.  

## 2021-05-02 NOTE — Assessment & Plan Note (Signed)
Status post pacemaker placement.  On Eliquis and metoprolol.  Has intermittent episodes of trembling sensation as outlined.  She is not sure if this is related to increased heart rate.  Had the episode of low pulse rate on 1 check by her therapist.  Discussed reevaluation by cardiology.  Discussed possible need for monitor, etc.  Complete pulmonary work-up with follow-up CT scan and pulmonary function testing.

## 2021-05-02 NOTE — Assessment & Plan Note (Addendum)
On thyroid replacement.  Follow tsh.  

## 2021-05-02 NOTE — Assessment & Plan Note (Signed)
Low carb diet and exercise.  Follow met b and a1c.   

## 2021-05-02 NOTE — Assessment & Plan Note (Signed)
Has been followed by cardiology.  Reports some fatigue which may be multifactorial.  She has recently had COVID.  She did notice while working with therapy heart rate down below 50.  She also states she has intermittent episodes where she feels trembling inside.  Not sure if these are episodes of increased heart rate.  Has been maintained on metoprolol.  Discussed with her regarding reevaluation by cardiology with questionable need for monitoring, etc.  Breathing has improved.  Is planning further pulmonary work-up with follow-up CT scan and pulmonary function testing scheduled in the near future.

## 2021-05-02 NOTE — Assessment & Plan Note (Signed)
Improved with compression hose.  She is taking her Lasix.  Follow.

## 2021-05-02 NOTE — Assessment & Plan Note (Signed)
Has been evaluated by cardiology.  On Lasix.  Recent COVID infection.  Cough and breathing have improved.  She still reports that shortness of breath with exertion.  Some fatigue.  Discussed with cardiology regarding further work-up as outlined.  She is planned to have follow-up CT scan of her chest and pulmonary function testing to help complete pulmonary work-up.

## 2021-05-02 NOTE — Assessment & Plan Note (Signed)
On thyroid replacement.  Follow TSH 

## 2021-05-02 NOTE — Assessment & Plan Note (Signed)
Continues on Cymbalta.  Increased stress related to her recent illness with COVID.  She had to stay in.  Overall feeling better.  Follow.

## 2021-05-02 NOTE — Assessment & Plan Note (Addendum)
Is followed by pulmonary.  Planning for follow-up CT scan of the chest as outlined.  Recently treated with Z-Pak and prednisone.  Breathing and cough has improved.  Follow.

## 2021-05-03 DIAGNOSIS — R2681 Unsteadiness on feet: Secondary | ICD-10-CM | POA: Diagnosis not present

## 2021-05-03 DIAGNOSIS — M1711 Unilateral primary osteoarthritis, right knee: Secondary | ICD-10-CM | POA: Diagnosis not present

## 2021-05-03 DIAGNOSIS — H353221 Exudative age-related macular degeneration, left eye, with active choroidal neovascularization: Secondary | ICD-10-CM | POA: Diagnosis not present

## 2021-05-03 DIAGNOSIS — R2689 Other abnormalities of gait and mobility: Secondary | ICD-10-CM | POA: Diagnosis not present

## 2021-05-04 ENCOUNTER — Ambulatory Visit
Admission: RE | Admit: 2021-05-04 | Discharge: 2021-05-04 | Disposition: A | Discharge status: Home / Self Care | Payer: Medicare Other | Source: Ambulatory Visit | Attending: Pulmonary Disease | Admitting: Pulmonary Disease

## 2021-05-04 ENCOUNTER — Ambulatory Visit (HOSPITAL_BASED_OUTPATIENT_CLINIC_OR_DEPARTMENT_OTHER): Payer: Medicare Other

## 2021-05-04 ENCOUNTER — Other Ambulatory Visit: Payer: Self-pay

## 2021-05-04 DIAGNOSIS — R918 Other nonspecific abnormal finding of lung field: Secondary | ICD-10-CM | POA: Diagnosis not present

## 2021-05-04 DIAGNOSIS — J479 Bronchiectasis, uncomplicated: Secondary | ICD-10-CM | POA: Diagnosis not present

## 2021-05-04 DIAGNOSIS — R911 Solitary pulmonary nodule: Secondary | ICD-10-CM | POA: Diagnosis not present

## 2021-05-04 DIAGNOSIS — R0602 Shortness of breath: Secondary | ICD-10-CM | POA: Diagnosis not present

## 2021-05-04 DIAGNOSIS — J449 Chronic obstructive pulmonary disease, unspecified: Secondary | ICD-10-CM | POA: Insufficient documentation

## 2021-05-04 DIAGNOSIS — I700 Atherosclerosis of aorta: Secondary | ICD-10-CM | POA: Diagnosis not present

## 2021-05-04 MED ORDER — ALBUTEROL SULFATE (2.5 MG/3ML) 0.083% IN NEBU
2.5000 mg | INHALATION_SOLUTION | Freq: Once | RESPIRATORY_TRACT | Status: AC
  Administered 2021-05-04: 2.5 mg via RESPIRATORY_TRACT
  Filled 2021-05-04: qty 3

## 2021-05-05 ENCOUNTER — Telehealth: Payer: Self-pay | Admitting: Pulmonary Disease

## 2021-05-05 DIAGNOSIS — M9931 Osseous stenosis of neural canal of cervical region: Secondary | ICD-10-CM | POA: Diagnosis not present

## 2021-05-05 DIAGNOSIS — M2578 Osteophyte, vertebrae: Secondary | ICD-10-CM | POA: Diagnosis not present

## 2021-05-05 DIAGNOSIS — M4722 Other spondylosis with radiculopathy, cervical region: Secondary | ICD-10-CM | POA: Diagnosis not present

## 2021-05-05 DIAGNOSIS — Z01818 Encounter for other preprocedural examination: Secondary | ICD-10-CM | POA: Diagnosis not present

## 2021-05-05 NOTE — Telephone Encounter (Signed)
Spoke with Dr. Patsey Berthold and she is aware of results and asked me to forward report to her PCP regards the lump in breast. Report has been faxed to Dr. Nicki Reaper.

## 2021-05-05 NOTE — Telephone Encounter (Signed)
Called and spoke Malachy Mood from [facility] calling with call report for CT scan. Dr. Patsey Berthold please advise  IMPRESSION: 1. 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 There are several new nodules on today's study, some of which are larger and solid in appearance measuring up to 8 mm. These are nonspecific in the setting of presumed chronic atypical infection, but close attention on follow-up studies is recommended, as the possibility of metastatic disease to the lungs is not entirely excluded. 2. This is particularly relevant in light of the enlarging partially calcified soft tissue mass in the left breast. This patient has had an apparent post lumpectomy scar in this region for some time, however, this has clearly enlarged over the past 2 years and is now more infiltrative in appearance in direct contact with the underlying left pectoralis major musculature. Findings are highly suspicious for locally recurrent breast cancer. Further clinical evaluation is recommended. 3. Aortic atherosclerosis, in addition to left main and 2 vessel coronary artery disease. 4. Dilatation of the pulmonic trunk (3.6 cm in diameter), concerning for pulmonary arterial hypertension.   These results will be called to the ordering clinician or representative by the Radiologist Assistant, and communication documented in the PACS or [employer].   Aortic Atherosclerosis (ICD10-I70.0).     Electronically Signed   By: Vinnie Langton M.D.   On: 05/05/2021 07:49

## 2021-05-06 DIAGNOSIS — R2681 Unsteadiness on feet: Secondary | ICD-10-CM | POA: Diagnosis not present

## 2021-05-06 DIAGNOSIS — M1711 Unilateral primary osteoarthritis, right knee: Secondary | ICD-10-CM | POA: Diagnosis not present

## 2021-05-06 DIAGNOSIS — R2689 Other abnormalities of gait and mobility: Secondary | ICD-10-CM | POA: Diagnosis not present

## 2021-05-06 NOTE — Telephone Encounter (Signed)
I received a copy of her CT.  Please notify her that there is mention of changes in her breast.  I would like for her to schedule an appt with me to evaluate her breast and discuss CT.  See if she can come in next Wednesday - April 17 at 2:00.

## 2021-05-07 DIAGNOSIS — R2681 Unsteadiness on feet: Secondary | ICD-10-CM | POA: Diagnosis not present

## 2021-05-07 DIAGNOSIS — M1711 Unilateral primary osteoarthritis, right knee: Secondary | ICD-10-CM | POA: Diagnosis not present

## 2021-05-07 DIAGNOSIS — R2689 Other abnormalities of gait and mobility: Secondary | ICD-10-CM | POA: Diagnosis not present

## 2021-05-07 NOTE — Telephone Encounter (Signed)
The changes in the lung are due to chronic inflammation/slow infection such as MAI.  The patient is asymptomatic in this regard.  I saw the patient for Dr. Mortimer Fries who is her primary pulmonologist.  Acquanetta Belling the findings of the CT to Dr. Mortimer Fries as well for review.  The concerning issue is potential breast mass which needs to be addressed.  Otherwise from the lung standpoint things are stable.

## 2021-05-07 NOTE — Telephone Encounter (Signed)
Spoke with patient and scheduled her for an office visit to see Dr Nicki Reaper on 8/17 to evaluate breast and discuss CT.

## 2021-05-09 DIAGNOSIS — M1711 Unilateral primary osteoarthritis, right knee: Secondary | ICD-10-CM | POA: Diagnosis not present

## 2021-05-09 DIAGNOSIS — R2689 Other abnormalities of gait and mobility: Secondary | ICD-10-CM | POA: Diagnosis not present

## 2021-05-09 DIAGNOSIS — R2681 Unsteadiness on feet: Secondary | ICD-10-CM | POA: Diagnosis not present

## 2021-05-10 ENCOUNTER — Other Ambulatory Visit: Payer: Self-pay

## 2021-05-10 ENCOUNTER — Encounter: Payer: Self-pay | Admitting: Adult Health

## 2021-05-10 ENCOUNTER — Ambulatory Visit (INDEPENDENT_AMBULATORY_CARE_PROVIDER_SITE_OTHER): Payer: Medicare Other | Admitting: Adult Health

## 2021-05-10 DIAGNOSIS — G4733 Obstructive sleep apnea (adult) (pediatric): Secondary | ICD-10-CM | POA: Diagnosis not present

## 2021-05-10 DIAGNOSIS — J479 Bronchiectasis, uncomplicated: Secondary | ICD-10-CM

## 2021-05-10 DIAGNOSIS — R9389 Abnormal findings on diagnostic imaging of other specified body structures: Secondary | ICD-10-CM

## 2021-05-10 MED ORDER — ALBUTEROL SULFATE HFA 108 (90 BASE) MCG/ACT IN AERS: 1.0000 | INHALATION_SPRAY | Freq: Four times a day (QID) | RESPIRATORY_TRACT | 1 refills | Status: DC | PRN

## 2021-05-10 NOTE — Assessment & Plan Note (Addendum)
Pulmonary nodularity concerning for MAI - continue to monitor . No acute symptoms. Continue on mucocillary clearance .  Incidental finding of breast mass , discussed will need further evaluation , hx of breast cancer  Has ov with PCP this week.

## 2021-05-10 NOTE — Progress Notes (Signed)
$'@Patient'I$  ID: Kerri Mills, female    DOB: 06-Nov-1930, 87 y.o.   MRN: [id number]  Chief Complaint  Patient presents with   Follow-up    Referring provider: Einar Pheasant, MD  HPI: 88 year old female never smoker followed for chronic bronchitis and bronchiectasis Medical history significant for Breast cancer, thyroid cancer and melanoma .   TEST/EVENTS :   05/10/2021 Follow up: Chronic Bronchitis and Bronchiectasis  Patient returns for a 58-monthfollow-up.  Patient has underlying chronic bronchitis and bronchiectasis.  She was recommended to complete pulmonary function testing which were done on May 04, 2021 that showed mild restriction with no significant airflow obstruction.  FEV1 78%, ratio 78, FVC 74%.  No significant bronchodilator response DLCO 76%. She was set up for a CT chest completed on May 04, 2021 which showed resolved right lower lobe nodule.  Areas of new nodularity measuring up to 8 mm in the periphery of the right lower lobe.  Mild bronchiectasis.  Micro and macro nodularity.  Incidental finding of a soft tissue mass in the left breast. We discussed this in detail .  Has some early morning cough and congestion. Has flutter valve not using .  Had Covid 19 infection 04/09/21 . [place] [facility]. Symptoms resolved after completing antibiotic and prednisone .  Has OSA on CPAP . Doing well on CPAP . Wears it each night , feels it really helps her with decreased daytime sleepiness. CPAP download shows excellent compliance with 90% usage , daily avg usage at 6 hr . AHI 7.8 . Auto CPAP 5 to 12cm .     Allergies  Allergen Reactions   Atorvastatin Other (See Comments)    Other reaction(s): Other (See Comments) STIFFNESS AND SORE STIFFNESS AND SORE   Lipitor [Atorvastatin Calcium] Other (See Comments)    Stiffness & soreness   Penicillins Rash    REACTION: Unknown reaction    Immunization History  Administered Date(s) Administered   Fluad  Quad(high Dose 65+) 06/06/2019, 07/07/2020   Influenza Split 07/18/2014   Influenza, High Dose Seasonal PF 07/01/2015, 06/13/2016, 05/23/2017, 06/25/2018   Influenza,inj,Quad PF,6+ Mos 06/11/2013   Moderna Sars-Covid-2 Vaccination 10/08/2019, 11/05/2019, 07/24/2020   Pneumococcal Conjugate-13 12/18/2013   Pneumococcal Polysaccharide-23 07/01/2015   Tdap 11/15/2016   Zoster Recombinat (Shingrix) 06/25/2018, 07/13/2018, 11/07/2018    Past Medical History:  Diagnosis Date   Allergy    Anxiety    Arthritis    Atypical chest pain    a. 10/2018 MV: small, fixed apical defect possibly 2/2 attenuation artifact. No ischemia.  EF 68%.   Clotting disorder ([facility]    Colitis    Depression    Diverticulitis 2013   Gastric ulcer    GERD (gastroesophageal reflux disease)    History of echocardiogram    a. 09/2019 Echo: EF 55-60%, mod LVH. Mildly dil LA. Triv MR/TR.   Hypercholesterolemia    Hypertension    Infiltrating lobular carcinoma of left breast 2011   T2,N0, ER: 90%; PR 0%; Her 2 neu not amplified. ([facility].   [place]   Melanoma in situ of upper extremity ([facility] 03/19/2011   Persistent atrial fibrillation ([facility]    a. CHADS2VASc => 4 (HTN, age x 2, female)   Personal history of radiation therapy 2011   BREAST CA   Seroma    HISTORY OF LFT BREAST   Sleep apnea    Thyroid cancer ([facility] 1992    Tobacco History: Social History   Tobacco  Use  Smoking Status Never  Smokeless Tobacco Never  Tobacco Comments   never   Counseling given: Not Answered Tobacco comments: never   Outpatient Medications Prior to Visit  Medication Sig Dispense Refill   acetaminophen (TYLENOL) 650 MG CR tablet Take 650 mg by mouth every 8 (eight) hours as needed for pain.     cholecalciferol (VITAMIN D3) 25 MCG (1000 UNIT) tablet Take 1,000 Units by mouth every other day.     DULoxetine (CYMBALTA) 60 MG capsule Take 1 capsule (60 mg total) by mouth at bedtime. 90 capsule 1    ELIQUIS 5 MG TABS tablet TAKE 1 TABLET BY MOUTH TWICE A DAY 180 tablet 1   furosemide (LASIX) 20 MG tablet TAKE 1 TABLET BY MOUTH DAILY AS NEEDED. TAKE WITH POTASSIUM. 30 tablet 1   gabapentin (NEURONTIN) 300 MG capsule TAKE 2 CAPSULES BY MOUTH AT BEDTIME 180 capsule 1   levothyroxine (SYNTHROID) 100 MCG tablet TAKE 1 TABLET EVERY DAY ON EMPTY STOMACHWITH A GLASS OF WATER AT LEAST 30-60 MINBEFORE BREAKFAST 90 tablet 1   lovastatin (MEVACOR) 40 MG tablet TAKE 1 TABLET BY MOUTH EVERY DAY 90 tablet 3   metoprolol tartrate (LOPRESSOR) 50 MG tablet Take 1 tablet (50 mg total) by mouth 2 (two) times daily. 180 tablet 3   Multiple Vitamins-Minerals (PRESERVISION AREDS 2) CAPS Take 1 tablet by mouth 2 (two) times daily.     omeprazole (PRILOSEC) 20 MG capsule TAKE 1 CAPSULE BY MOUTH ONCE DAILY 90 capsule 3   Polyethyl Glycol-Propyl Glycol (SYSTANE OP) Place 1 drop into both eyes daily as needed (for dry eyes).     polyethylene glycol powder (GLYCOLAX/MIRALAX) 17 GM/SCOOP powder Take 17 g by mouth daily. 850 g 1   predniSONE (STERAPRED UNI-PAK 21 TAB) 10 MG (21) TBPK tablet Use as directed. 21 each 0   White Petrolatum-Mineral Oil (GENTEAL TEARS NIGHT-TIME OP) Place 1 application into both eyes at bedtime. Night time ointment 3.5g     potassium chloride (KLOR-CON) 10 MEQ tablet TAKE 2 TABLETS BY MOUTH DAILY AS NEEDED.WITH FUROSEMIDE. 60 tablet 1   potassium chloride SA (KLOR-CON) 20 MEQ tablet Take 20 mEq by mouth 3 (three) times a week.     No facility-administered medications prior to visit.     Review of Systems:   Constitutional:   No  weight loss, night sweats,  Fevers, chills,  +fatigue, or  lassitude.  HEENT:   No headaches,  Difficulty swallowing,  Tooth/dental problems, or  Sore throat,                No sneezing, itching, ear ache,  +nasal congestion, post nasal drip,   CV:  No chest pain,  Orthopnea, PND, swelling in lower extremities, anasarca, dizziness, palpitations, syncope.   GI   No heartburn, indigestion, abdominal pain, nausea, vomiting, diarrhea, change in bowel habits, loss of appetite, bloody stools.   Resp:   No chest wall deformity  Skin: no rash or lesions.  GU: no dysuria, change in color of urine, no urgency or frequency.  No flank pain, no hematuria   MS:  No joint pain or swelling.  No decreased range of motion.  No back pain.    Physical Exam  BP 114/60 (BP Location: Left Arm, Patient Position: Sitting, Cuff Size: Normal)   Pulse 68   Temp 97.8 F (36.6 C) (Oral)   Ht '5\' 5"'$  (1.651 m)   Wt 174 lb 3.2 oz (79 kg)   SpO2 94%  BMI 28.99 kg/m   GEN: A/Ox3; pleasant , NAD, well nourished    HEENT:  [place]/AT,  NOSE-clear, THROAT-clear, no lesions, no postnasal drip or exudate noted.   NECK:  Supple w/ fair ROM; no JVD; normal carotid impulses w/o bruits; no thyromegaly or nodules palpated; no lymphadenopathy.    RESP  Clear  P & A; w/o, wheezes/ rales/ or rhonchi. no accessory muscle use, no dullness to percussion  CARD:  RRR, no m/r/g, no peripheral edema, pulses intact, no cyanosis or clubbing.  GI:   Soft & nt; nml bowel sounds; no organomegaly or masses detected.   Musco: Warm bil, no deformities or joint swelling noted.   Neuro: alert, no focal deficits noted.    Skin: Warm, no lesions or rashes    Lab Results:       ProBNP No results found for: PROBNP  Imaging: CT Chest Wo Contrast  Result Date: 05/05/2021 CLINICAL DATA:  87 year old female with history of pulmonary nodule. Follow-up study. Shortness of breath since COVID infection in July 2022. EXAM: CT CHEST WITHOUT CONTRAST TECHNIQUE: Multidetector CT imaging of the chest was performed following the standard protocol without IV contrast. COMPARISON:  Chest CT 11/03/2020. FINDINGS: Cardiovascular: Heart size is normal. There is no significant pericardial fluid, thickening or pericardial calcification. There is aortic atherosclerosis, as well as atherosclerosis of the great  vessels of the mediastinum and the coronary arteries, including calcified atherosclerotic plaque in the left main, left anterior descending and left circumflex coronary arteries. Mild dilatation of the pulmonic trunk (3.6 cm in diameter). Left-sided pacemaker device in place with lead tips terminating in the right atrium and right ventricular apex. Calcifications of the mitral annulus. Mediastinum/Nodes: No pathologically enlarged mediastinal or hilar lymph nodes. Please note that accurate exclusion of hilar adenopathy is limited on noncontrast CT scans. Small hiatal hernia. No axillary lymphadenopathy. Lungs/Pleura: Previously noted ground-glass attenuation nodule of concern in the right lower lobe has resolved indicative of a benign infectious or inflammatory etiology on the prior study. However, today's study demonstrates multiple new pulmonary nodules, many of which are solid in appearance measuring up to 8 mm in the periphery of the right lower lobe (axial image 117 of series 3). 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 No confluent consolidative airspace disease. No pleural effusions. Upper Abdomen: Aortic atherosclerosis.  Status post cholecystectomy. Musculoskeletal: Enlarging partially calcified soft tissue mass in the left breast associated with regional architectural distortion now completely obscuring the fat plane with the underlying pectoralis major musculature, clearly increased in size compared to prior examinations (particularly when compared to more remote prior study from 12/10/2018), concerning for locally recurrent breast cancer, currently measuring 6.6 x 2.6 x 6.9 cm (axial image  68 of series 2 and coronal image 30 of series 5). There are no aggressive appearing lytic or blastic lesions noted in the visualized portions of the skeleton. IMPRESSION: 1. 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 There are several new nodules on today's study, some of which are larger and solid in appearance measuring up to 8 mm. These are nonspecific in the setting of presumed chronic atypical infection, but close attention on follow-up studies is  recommended, as the possibility of metastatic disease to the lungs is not entirely excluded. 2. This is particularly relevant in light of the enlarging partially calcified soft tissue mass in the left breast. This patient has had an apparent post lumpectomy scar in this region for some time, however, this has clearly enlarged over the past 2 years and is now more infiltrative in appearance in direct contact with the underlying left pectoralis major musculature. Findings are highly suspicious for locally recurrent breast cancer. Further clinical evaluation is recommended. 3. Aortic atherosclerosis, in addition to left main and 2 vessel coronary artery disease. 4. Dilatation of the pulmonic trunk (3.6 cm in diameter), concerning for pulmonary arterial hypertension. These results will be called to the ordering clinician or representative by the Radiologist Assistant, and communication documented in the PACS or [employer]. Aortic Atherosclerosis (ICD10-I70.0). Electronically Signed   By: Vinnie Langton M.D.   On: 05/05/2021 07:49   CUP PACEART REMOTE DEVICE CHECK  Result Date: 04/20/2021 Scheduled remote reviewed. Normal device function.  Next remote 91 days- JBox, RN/CVRS  Pulmonary Function Test [facility] Only  Result Date: 05/04/2021 Spirometry Data Is Acceptable and Reproducible Mild Obstructive Airways Disease without   Significant Broncho-Dilator Response +air trapping(incrased RV) Consider outpatient Pulmonary Consultation if needed Clinical Correlation Advised    albuterol (PROVENTIL) (2.5 MG/3ML) 0.083% nebulizer solution 2.5 mg     Date Action Dose Route User   05/04/2021 1342 Given 2.5 mg Nebulization [place], Amy, RRT       No flowsheet data found.  No results found for: NITRICOXIDE      Assessment & Plan:   Bronchiectasis without complication ([place]) Appears stable with no exacerbation. Has minimal daily cough  Would restart Flutter.  CT chest shows area of possible MAI, if cough increases consider check sputum AFB .  PFT shows mild to moderate restriction with no significant obstruction .   Plan  Patient Instructions  Restart Flutter valve Three times a day  .  Mucinex Twice daily  As needed  cough/congestion  Albuterol inhaler 1-2 puffs every 6hr as needed for wheezing /shortness of breath   Continue on CPAP At bedtime   Work on healthy weight .  Do not drive if sleepy .   Follow up with Primary Provider regarding abnormal breast finding .   Follow up Dr. Mortimer Fries in 4 months and As needed   Please contact office for sooner follow up if symptoms do not improve or worsen or seek emergency care       Abnormal chest CT Pulmonary nodularity concerning for MAI - continue to monitor . No acute symptoms. Continue on mucocillary clearance .  Incidental finding of breast mass , discussed will need further evaluation , hx of breast cancer  Has ov with PCP this week.    Obstructive sleep apnea Good control and compliance on CPAP  Continue on CPAP At bedtime    Plan  Patient Instructions  Restart Flutter valve Three times a day  .  Mucinex Twice daily  As needed  cough/congestion  Albuterol inhaler 1-2 puffs every 6hr as needed for wheezing /shortness of breath   Continue on CPAP At bedtime   Work on healthy weight .  Do not drive if sleepy .   Follow up with Primary Provider  regarding abnormal breast finding .   Follow up Dr. Mortimer Fries in 4 months and As needed   Please contact office for sooner follow up if symptoms do not improve or worsen or  seek emergency care         Rexene Edison, NP 05/10/2021

## 2021-05-10 NOTE — Assessment & Plan Note (Signed)
Good control and compliance on CPAP  Continue on CPAP At bedtime    Plan  Patient Instructions  Restart Flutter valve Three times a day  .  Mucinex Twice daily  As needed  cough/congestion  Albuterol inhaler 1-2 puffs every 6hr as needed for wheezing /shortness of breath   Continue on CPAP At bedtime   Work on healthy weight .  Do not drive if sleepy .   Follow up with Primary Provider regarding abnormal breast finding .   Follow up Dr. Mortimer Fries in 4 months and As needed   Please contact office for sooner follow up if symptoms do not improve or worsen or seek emergency care

## 2021-05-10 NOTE — Patient Instructions (Signed)
Restart Flutter valve Three times a day  .  Mucinex Twice daily  As needed  cough/congestion  Albuterol inhaler 1-2 puffs every 6hr as needed for wheezing /shortness of breath   Continue on CPAP At bedtime   Work on healthy weight .  Do not drive if sleepy .   Follow up with Primary Provider regarding abnormal breast finding .   Follow up Dr. Mortimer Fries in 4 months and As needed   Please contact office for sooner follow up if symptoms do not improve or worsen or seek emergency care

## 2021-05-10 NOTE — Assessment & Plan Note (Signed)
Appears stable with no exacerbation. Has minimal daily cough  Would restart Flutter.  CT chest shows area of possible MAI, if cough increases consider check sputum AFB .  PFT shows mild to moderate restriction with no significant obstruction .   Plan  Patient Instructions  Restart Flutter valve Three times a day  .  Mucinex Twice daily  As needed  cough/congestion  Albuterol inhaler 1-2 puffs every 6hr as needed for wheezing /shortness of breath   Continue on CPAP At bedtime   Work on healthy weight .  Do not drive if sleepy .   Follow up with Primary Provider regarding abnormal breast finding .   Follow up Dr. Mortimer Fries in 4 months and As needed   Please contact office for sooner follow up if symptoms do not improve or worsen or seek emergency care

## 2021-05-11 DIAGNOSIS — R2689 Other abnormalities of gait and mobility: Secondary | ICD-10-CM | POA: Diagnosis not present

## 2021-05-11 DIAGNOSIS — R2681 Unsteadiness on feet: Secondary | ICD-10-CM | POA: Diagnosis not present

## 2021-05-11 DIAGNOSIS — M1711 Unilateral primary osteoarthritis, right knee: Secondary | ICD-10-CM | POA: Diagnosis not present

## 2021-05-12 ENCOUNTER — Ambulatory Visit (INDEPENDENT_AMBULATORY_CARE_PROVIDER_SITE_OTHER): Payer: Medicare Other | Admitting: Internal Medicine

## 2021-05-12 ENCOUNTER — Other Ambulatory Visit: Payer: Self-pay

## 2021-05-12 DIAGNOSIS — R748 Abnormal levels of other serum enzymes: Secondary | ICD-10-CM | POA: Diagnosis not present

## 2021-05-12 DIAGNOSIS — Z85850 Personal history of malignant neoplasm of thyroid: Secondary | ICD-10-CM | POA: Diagnosis not present

## 2021-05-12 DIAGNOSIS — Z853 Personal history of malignant neoplasm of breast: Secondary | ICD-10-CM

## 2021-05-12 DIAGNOSIS — I10 Essential (primary) hypertension: Secondary | ICD-10-CM | POA: Diagnosis not present

## 2021-05-12 DIAGNOSIS — I4891 Unspecified atrial fibrillation: Secondary | ICD-10-CM

## 2021-05-12 DIAGNOSIS — J479 Bronchiectasis, uncomplicated: Secondary | ICD-10-CM | POA: Diagnosis not present

## 2021-05-12 DIAGNOSIS — K219 Gastro-esophageal reflux disease without esophagitis: Secondary | ICD-10-CM

## 2021-05-12 DIAGNOSIS — E039 Hypothyroidism, unspecified: Secondary | ICD-10-CM | POA: Diagnosis not present

## 2021-05-12 DIAGNOSIS — E7800 Pure hypercholesterolemia, unspecified: Secondary | ICD-10-CM | POA: Diagnosis not present

## 2021-05-12 DIAGNOSIS — M7989 Other specified soft tissue disorders: Secondary | ICD-10-CM | POA: Diagnosis not present

## 2021-05-12 NOTE — Progress Notes (Signed)
Patient ID: Kerri Mills, female   DOB: 01/27/1931, 87 y.o.   MRN: [id number]   Subjective:    Patient ID: Kerri Mills, female    DOB: 04-12-1931, 87 y.o.   MRN: [id number]  HPI This visit occurred during the SARS-CoV-2 public health emergency.  Safety protocols were in place, including screening questions prior to the visit, additional usage of staff PPE, and extensive cleaning of exam room while observing appropriate contact time as indicated for disinfecting solutions.   Patient here for work in appt.  Work in to discuss recent CT scan.  Has a history of chronic bronchitis and bronchiectasis.  Had PFTs 05/04/21 - mild restriction with no significant airflow obstruction.  CT chest 05/04/21 - resolved RLL nodule.  Areas of new nodularity measuring up to 58m in the periphery of the rLL.  Mild bronchiectasis.  Micro and macro nodularity.  Incidental finding of soft tissue mass in the left breast.  Covid 04/09/21.  Better after being treated with zpak and prednisone.  Using cpap to treat sleep apnea.  Recommend restart flutter valve and mucinex.  Albuterol if needed.  Continue cpap. Her breathing overall appears to be improved.  Discussed breast findings.  Discussed further w/up and evaluation. Has been seeing Dr BBary Castilla  Discussed f/u mammogram.  Prefers evaluation first by Dr BBary Castilla  No pain.   Eating.  No nausea or vomiting.  Bowels moving.   Past Medical History:  Diagnosis Date   Allergy    Anxiety    Arthritis    Atypical chest pain    a. 10/2018 MV: small, fixed apical defect possibly 2/2 attenuation artifact. No ischemia.  EF 68%.   Clotting disorder ([facility]    Colitis    Depression    Diverticulitis 2013   Gastric ulcer    GERD (gastroesophageal reflux disease)    History of echocardiogram    a. 09/2019 Echo: EF 55-60%, mod LVH. Mildly dil LA. Triv MR/TR.   Hypercholesterolemia    Hypertension    Infiltrating lobular carcinoma of left breast 2011   T2,N0, ER: 90%; PR 0%; Her 2 neu not  amplified. ([facility].   Melanoma ([facility] [place]   [place] in situ of upper extremity ([facility] 03/19/2011   Persistent atrial fibrillation ([facility]    a. CHADS2VASc => 4 (HTN, age x 2, female)   Personal history of radiation therapy 2011   BREAST CA   Seroma    HISTORY OF LFT BREAST   Sleep apnea    Thyroid cancer ([facility] 1992   Past Surgical History:  Procedure Laterality Date   ABDOMINAL HYSTERECTOMY  1973   partial   BREAST BIOPSY Left 02-13-13   BENIGN BREAST TISSUE WITH CHANGES CONSISTENT WITH FAT NECROSIS   BREAST BIOPSY Left 01/21/2015   bx done in [facility] office 11:00 left 6-8cmfn   BREAST EXCISIONAL BIOPSY Left 1995   neg   BREAST EXCISIONAL BIOPSY Left 2011   Breast cancer radiation   BREAST LUMPECTOMY Left 2011   BREAST CA   CARDIAC CATHETERIZATION     CHOLECYSTECTOMY     COLONOSCOPY  2013   MELANOMA EXCISION     RT UPPER ARM   PACEMAKER IMPLANT N/A 01/10/2020   Procedure: PACEMAKER IMPLANT;  Surgeon: CConstance Haw MD;  [facility];  Service: Cardiovascular;  Laterality: N/A;   PARTIAL HYSTERECTOMY     bleeding, ovaries in place.     THYROID SURGERY  1992   FOR THYROID CANCER  TONSILLECTOMY     Family History  Problem Relation Age of Onset   Heart disease Mother    Cancer Brother        lung    Cancer Sister        breast   Breast cancer Neg Hx    Social History   Socioeconomic History   Marital status: Widowed    Spouse name: Not on file   Number of children: Not on file   Years of education: Not on file   Highest education level: Not on file  Occupational History   Not on file  Tobacco Use   Smoking status: Never   Smokeless tobacco: Never   Tobacco comments:    never  Substance and Sexual Activity   Alcohol use: Yes    Alcohol/week: 0.0 standard drinks    Comment: social drinking. average times a week   Drug use: No   Sexual activity: Never  Other Topics Concern   Not on file  Social History Narrative    Independent and baseline. Lives by herself   Social Determinants of Health   Financial Resource Strain: Low Risk    Difficulty of Paying Living Expenses: Not hard at all  Food Insecurity: No Food Insecurity   Worried About Charity fundraiser in the Last Year: Never true   Arboriculturist in the Last Year: Never true  Transportation Needs: No Transportation Needs   Lack of Transportation (Medical): No   Lack of Transportation (Non-Medical): No  Physical Activity: Sufficiently Active   Days of Exercise per Week: 4 days   Minutes of Exercise per Session: 40 min  Stress: Stress Concern Present   Feeling of Stress : To some extent  Social Connections: Unknown   Frequency of Communication with Friends and Family: More than three times a week   Frequency of Social Gatherings with Friends and Family: More than three times a week   Attends Religious Services: Not on file   Active Member of Clubs or Organizations: Not on file   Attends Archivist Meetings: Not on file   Marital Status: Widowed    Review of Systems  Constitutional:  Negative for fatigue and unexpected weight change.  HENT:  Negative for sinus pressure.   Respiratory:  Negative for cough and chest tightness.        Breathing overall improved.    Cardiovascular:  Negative for chest pain, palpitations and leg swelling.  Gastrointestinal:  Negative for abdominal pain, diarrhea, nausea and vomiting.  Genitourinary:  Negative for difficulty urinating and dysuria.  Musculoskeletal:  Negative for joint swelling and myalgias.  Skin:  Negative for color change and rash.  Neurological:  Negative for dizziness, light-headedness and headaches.  Psychiatric/Behavioral:  Negative for agitation and dysphoric mood.       Objective:    Physical Exam Vitals reviewed.  Constitutional:      General: She is not in acute distress.    Appearance: Normal appearance.  HENT:     Head: Normocephalic and atraumatic.     Right  Ear: External ear normal.     Left Ear: External ear normal.  Eyes:     General: No scleral icterus.       Right eye: No discharge.        Left eye: No discharge.     Conjunctiva/sclera: Conjunctivae normal.  Neck:     Thyroid: No thyromegaly.  Cardiovascular:     Rate and Rhythm: Normal rate  and regular rhythm.  Pulmonary:     Effort: No respiratory distress.     Breath sounds: Normal breath sounds. No wheezing.     Comments: Breasts:  no nipple discharge or nipple retraction present - right breast.  Could not appreciate distinct nodules or axillary adenopathy - right breast.  Left breast - changes from previous breast surgery.  No axillary adenopathy appreciated.   Abdominal:     General: Bowel sounds are normal.     Palpations: Abdomen is soft.     Tenderness: There is no abdominal tenderness.  Musculoskeletal:        General: No swelling or tenderness.     Cervical back: Neck supple. No tenderness.  Lymphadenopathy:     Cervical: No cervical adenopathy.  Skin:    Findings: No erythema or rash.  Neurological:     Mental Status: She is alert.  Psychiatric:        Mood and Affect: Mood normal.        Behavior: Behavior normal.    BP 130/78   Pulse 64   Temp (!) 96 F (35.6 C)   Resp 16   Ht '5\' 5"'$  (1.651 m)   Wt 174 lb 9.6 oz (79.2 kg)   SpO2 97%   BMI 29.05 kg/m  Wt Readings from Last 3 Encounters:  05/12/21 174 lb 9.6 oz (79.2 kg)  05/10/21 174 lb 3.2 oz (79 kg)  04/27/21 173 lb 12.8 oz (78.8 kg)    Outpatient Encounter Medications as of 05/12/2021  Medication Sig   acetaminophen (TYLENOL) 650 MG CR tablet Take 650 mg by mouth every 8 (eight) hours as needed for pain.   albuterol (VENTOLIN HFA) 108 (90 Base) MCG/ACT inhaler Inhale 1-2 puffs into the lungs every 6 (six) hours as needed for wheezing or shortness of breath.   cholecalciferol (VITAMIN D3) 25 MCG (1000 UNIT) tablet Take 1,000 Units by mouth every other day.   DULoxetine (CYMBALTA) 60 MG capsule Take  1 capsule (60 mg total) by mouth at bedtime.   furosemide (LASIX) 20 MG tablet TAKE 1 TABLET BY MOUTH DAILY AS NEEDED. TAKE WITH POTASSIUM.   gabapentin (NEURONTIN) 300 MG capsule TAKE 2 CAPSULES BY MOUTH AT BEDTIME   levothyroxine (SYNTHROID) 100 MCG tablet TAKE 1 TABLET EVERY DAY ON EMPTY STOMACHWITH A GLASS OF WATER AT LEAST 30-60 MINBEFORE BREAKFAST   lovastatin (MEVACOR) 40 MG tablet TAKE 1 TABLET BY MOUTH EVERY DAY   metoprolol tartrate (LOPRESSOR) 50 MG tablet Take 1 tablet (50 mg total) by mouth 2 (two) times daily.   Multiple Vitamins-Minerals (PRESERVISION AREDS 2) CAPS Take 1 tablet by mouth 2 (two) times daily.   omeprazole (PRILOSEC) 20 MG capsule TAKE 1 CAPSULE BY MOUTH ONCE DAILY   Polyethyl Glycol-Propyl Glycol (SYSTANE OP) Place 1 drop into both eyes daily as needed (for dry eyes).   polyethylene glycol powder (GLYCOLAX/MIRALAX) 17 GM/SCOOP powder Take 17 g by mouth daily.   potassium chloride (KLOR-CON) 10 MEQ tablet TAKE 2 TABLETS BY MOUTH DAILY AS NEEDED.WITH FUROSEMIDE.   potassium chloride SA (KLOR-CON) 20 MEQ tablet Take 20 mEq by mouth 3 (three) times a week.   White Petrolatum-Mineral Oil (GENTEAL TEARS NIGHT-TIME OP) Place 1 application into both eyes at bedtime. Night time ointment 3.5g   [DISCONTINUED] ELIQUIS 5 MG TABS tablet TAKE 1 TABLET BY MOUTH TWICE A DAY   [DISCONTINUED] predniSONE (STERAPRED UNI-PAK 21 TAB) 10 MG (21) TBPK tablet Use as directed.   No facility-administered encounter medications  on file as of 05/12/2021.     Lab Results  Component Value Date   WBC 8.6 04/27/2021   HGB 13.3 04/27/2021   HCT 41.2 04/27/2021   PLT 262.0 04/27/2021   GLUCOSE 105 (H) 04/27/2021   CHOL 163 03/15/2021   TRIG 210.0 (H) 03/15/2021   HDL 35.50 (L) 03/15/2021   LDLDIRECT 90.0 03/15/2021   LDLCALC 86 06/02/2020   ALT 22 04/27/2021   AST 16 04/27/2021   NA 140 04/27/2021   K 4.3 04/27/2021   CL 101 04/27/2021   CREATININE 0.84 04/27/2021   BUN 14 04/27/2021    CO2 31 04/27/2021   TSH 1.39 05/12/2021   INR 1.5 (H) 11/21/2018   HGBA1C 6.5 03/15/2021    CT Chest Wo Contrast  Result Date: 05/05/2021 CLINICAL DATA:  87 year old female with history of pulmonary nodule. Follow-up study. Shortness of breath since COVID infection in July 2022. EXAM: CT CHEST WITHOUT CONTRAST TECHNIQUE: Multidetector CT imaging of the chest was performed following the standard protocol without IV contrast. COMPARISON:  Chest CT 11/03/2020. FINDINGS: Cardiovascular: Heart size is normal. There is no significant pericardial fluid, thickening or pericardial calcification. There is aortic atherosclerosis, as well as atherosclerosis of the great vessels of the mediastinum and the coronary arteries, including calcified atherosclerotic plaque in the left main, left anterior descending and left circumflex coronary arteries. Mild dilatation of the pulmonic trunk (3.6 cm in diameter). Left-sided pacemaker device in place with lead tips terminating in the right atrium and right ventricular apex. Calcifications of the mitral annulus. Mediastinum/Nodes: No pathologically enlarged mediastinal or hilar lymph nodes. Please note that accurate exclusion of hilar adenopathy is limited on noncontrast CT scans. Small hiatal hernia. No axillary lymphadenopathy. Lungs/Pleura: Previously noted ground-glass attenuation nodule of concern in the right lower lobe has resolved indicative of a benign infectious or inflammatory etiology on the prior study. However, today's study demonstrates multiple new pulmonary nodules, many of which are solid in appearance measuring up to 8 mm in the periphery of the right lower lobe (axial image 117 of series 3). 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 No confluent consolidative airspace disease. No pleural effusions. Upper Abdomen: Aortic atherosclerosis.  Status post cholecystectomy. Musculoskeletal: Enlarging partially calcified soft tissue mass in the left breast associated with regional architectural distortion now completely obscuring the fat plane with the underlying pectoralis major musculature, clearly increased in size compared to prior examinations (particularly when compared to more remote prior study from 12/10/2018), concerning for locally recurrent breast cancer, currently measuring 6.6 x 2.6 x 6.9 cm (axial image 68 of series 2 and coronal image 30 of series 5). There are no aggressive appearing lytic or blastic lesions noted in the visualized portions of the skeleton. IMPRESSION: 1. 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 There are several new nodules on today's study, some of which are larger and solid in appearance measuring up to 8 mm. These are nonspecific in the setting of presumed chronic atypical infection, but close attention on follow-up studies is recommended, as the possibility of metastatic disease to the lungs is not entirely excluded. 2. This is particularly relevant in light of the enlarging  partially calcified soft tissue mass in the left breast. This patient has had an apparent post lumpectomy scar in this region for some time, however, this has clearly enlarged over the past 2 years and is now more infiltrative in appearance in direct contact with the underlying left pectoralis major musculature. Findings are highly suspicious for locally recurrent breast cancer. Further clinical evaluation is recommended. 3. Aortic atherosclerosis, in addition to left main and 2 vessel coronary artery disease. 4.  Dilatation of the pulmonic trunk (3.6 cm in diameter), concerning for pulmonary arterial hypertension. These results will be called to the ordering clinician or representative by the Radiologist Assistant, and communication documented in the PACS or [employer]. Aortic Atherosclerosis (ICD10-I70.0). Electronically Signed   By: Vinnie Langton M.D.   On: 05/05/2021 07:49   Pulmonary Function Test [facility] Only  Result Date: 05/04/2021 Spirometry Data Is Acceptable and Reproducible Mild Obstructive Airways Disease without  Significant Broncho-Dilator Response +air trapping(incrased RV) Consider outpatient Pulmonary Consultation if needed Clinical Correlation Advised       Assessment & Plan:   Problem List Items Addressed This Visit     Atrial fibrillation [facility])    S/p pacemaker placement.  On eliquis and metoprolol.  See last note.  Discussed reevaluation by cardiology.       Bronchiectasis without complication ([place])    Just saw pulmonary.  Recommended restarting flutter valve.  mucinex as directed.  Albuterol inhaler prn.  CT chest as outlined.  Continue f/u with pulmonary.       Essential hypertension    On metoprolol and lasix.  Blood pressure as outlined.  Appears to be doing better.  Continue current medication regimen.  Follow pressures.  Follow metabolic panel.       GERD (gastroesophageal reflux disease)    Upper symptoms appear to be controlled on omeprazole.       History of breast cancer    Mammogram 12/21/20 - Birads I.  Had CT scan as outlined.  Changes as outlined:  enlarging partially calcified soft tissue mass in the left breast.  Per report - has enlarged over the past 2 years and is now more infiltrative in appearance in direct contact with the underlying left pectoralis major musculature. Findings are highly suspicious for locally recurrent breast cancer. Discussed with her today.  Will have Dr Bary Castilla reevaluate.        Relevant Orders   Ambulatory referral to  General Surgery   History of thyroid cancer   Hypercholesterolemia    On lovastatin.  Continue diet and exercise.  Follow fasting profile and liver panel.       Hypothyroid    On thyroid replacement.  Follow tsh.       Swelling of lower extremity    Swelling improved.  Continue compression hose.        Other Visit Diagnoses     Elevated alkaline phosphatase level            Einar Pheasant, MD

## 2021-05-13 DIAGNOSIS — R2689 Other abnormalities of gait and mobility: Secondary | ICD-10-CM | POA: Diagnosis not present

## 2021-05-13 DIAGNOSIS — M1711 Unilateral primary osteoarthritis, right knee: Secondary | ICD-10-CM | POA: Diagnosis not present

## 2021-05-13 DIAGNOSIS — R2681 Unsteadiness on feet: Secondary | ICD-10-CM | POA: Diagnosis not present

## 2021-05-13 LAB — TSH: TSH: 1.39 u[IU]/mL (ref 0.35–5.50)

## 2021-05-13 LAB — GAMMA GT: GGT: 10 U/L (ref 7–51)

## 2021-05-15 LAB — ALKALINE PHOSPHATASE, ISOENZYMES
Alkaline Phosphatase: 142 IU/L — ABNORMAL HIGH (ref 44–121)
BONE FRACTION: 41 % (ref 14–68)
INTESTINAL FRAC.: 11 % (ref 0–18)
LIVER FRACTION: 48 % (ref 18–85)

## 2021-05-16 ENCOUNTER — Other Ambulatory Visit: Payer: Self-pay | Admitting: Cardiology

## 2021-05-17 ENCOUNTER — Encounter: Payer: Self-pay | Admitting: Internal Medicine

## 2021-05-17 DIAGNOSIS — R2681 Unsteadiness on feet: Secondary | ICD-10-CM | POA: Diagnosis not present

## 2021-05-17 DIAGNOSIS — M1711 Unilateral primary osteoarthritis, right knee: Secondary | ICD-10-CM | POA: Diagnosis not present

## 2021-05-17 DIAGNOSIS — R2689 Other abnormalities of gait and mobility: Secondary | ICD-10-CM | POA: Diagnosis not present

## 2021-05-17 NOTE — Assessment & Plan Note (Signed)
On thyroid replacement.  Follow tsh.  

## 2021-05-17 NOTE — Assessment & Plan Note (Signed)
Upper symptoms appear to be controlled on omeprazole.  

## 2021-05-17 NOTE — Assessment & Plan Note (Signed)
On metoprolol and lasix.  Blood pressure as outlined.  Appears to be doing better.  Continue current medication regimen.  Follow pressures.  Follow metabolic panel.

## 2021-05-17 NOTE — Progress Notes (Signed)
Remote pacemaker transmission.   

## 2021-05-17 NOTE — Assessment & Plan Note (Signed)
Mammogram 12/21/20 - Birads I.  Had CT scan as outlined.  Changes as outlined:  enlarging partially calcified soft tissue mass in the left breast.  Per report - has enlarged over the past 2 years and is now more infiltrative in appearance in direct contact with the underlying left pectoralis major musculature. Findings are highly suspicious for locally recurrent breast cancer. Discussed with her today.  Will have Dr Bary Castilla reevaluate.

## 2021-05-17 NOTE — Assessment & Plan Note (Signed)
Just saw pulmonary.  Recommended restarting flutter valve.  mucinex as directed.  Albuterol inhaler prn.  CT chest as outlined.  Continue f/u with pulmonary.

## 2021-05-17 NOTE — Assessment & Plan Note (Signed)
S/p pacemaker placement.  On eliquis and metoprolol.  See last note.  Discussed reevaluation by cardiology.

## 2021-05-17 NOTE — Assessment & Plan Note (Signed)
Swelling improved.  Continue compression hose.

## 2021-05-17 NOTE — Assessment & Plan Note (Signed)
On lovastatin.  Continue diet and exercise.  Follow fasting profile and liver panel.  

## 2021-05-17 NOTE — Telephone Encounter (Signed)
Eliquis '5mg'$  refill request received. Patient is 87 years old, weight-79.2kg, Crea-0.84 on 04/27/21, Diagnosis-Afib, and last seen by Dr. Curt Bears on 03/05/21. Dose is appropriate based on dosing criteria. Will send in refill to requested pharmacy.

## 2021-05-18 ENCOUNTER — Other Ambulatory Visit: Payer: Self-pay | Admitting: General Surgery

## 2021-05-18 DIAGNOSIS — N6321 Unspecified lump in the left breast, upper outer quadrant: Secondary | ICD-10-CM | POA: Diagnosis not present

## 2021-05-19 ENCOUNTER — Ambulatory Visit (INDEPENDENT_AMBULATORY_CARE_PROVIDER_SITE_OTHER): Payer: Medicare Other | Admitting: Gastroenterology

## 2021-05-19 ENCOUNTER — Encounter: Payer: Self-pay | Admitting: Gastroenterology

## 2021-05-19 ENCOUNTER — Other Ambulatory Visit: Payer: Self-pay

## 2021-05-19 ENCOUNTER — Telehealth: Payer: Self-pay

## 2021-05-19 VITALS — BP 130/67 | HR 74 | Temp 98.3°F | Ht 65.0 in | Wt 172.8 lb

## 2021-05-19 DIAGNOSIS — R2689 Other abnormalities of gait and mobility: Secondary | ICD-10-CM | POA: Diagnosis not present

## 2021-05-19 DIAGNOSIS — M1711 Unilateral primary osteoarthritis, right knee: Secondary | ICD-10-CM | POA: Diagnosis not present

## 2021-05-19 DIAGNOSIS — K625 Hemorrhage of anus and rectum: Secondary | ICD-10-CM

## 2021-05-19 DIAGNOSIS — R2681 Unsteadiness on feet: Secondary | ICD-10-CM | POA: Diagnosis not present

## 2021-05-19 DIAGNOSIS — K921 Melena: Secondary | ICD-10-CM

## 2021-05-19 DIAGNOSIS — R634 Abnormal weight loss: Secondary | ICD-10-CM

## 2021-05-19 MED ORDER — NA SULFATE-K SULFATE-MG SULF 17.5-3.13-1.6 GM/177ML PO SOLN: 354.0000 mL | Freq: Once | ORAL | 0 refills | Status: AC

## 2021-05-19 NOTE — Progress Notes (Signed)
Kerri Bellows MD, MRCP(U.K) [address]  [place], [place] [address]  Main: [phone]  Fax: [phone]   Gastroenterology Consultation  Referring Provider:     Einar Pheasant, MD Primary Care Physician:  Einar Pheasant, MD Primary Gastroenterologist:  Dr. Jonathon Mills  Reason for Consultation: Rectal bleeding        HPI:   Kerri Mills is a 87 y.o. y/o female referred for consultation & management  by Dr. Einar Pheasant, MD.     She has been referred for rectal bleeding.  04/27/2021 hemoglobin 13.3 g, B12 low at 270 fecal occult blood test positive.She is she is on Eliquis  Last colonoscopy in 2013 showed internal hemorrhoids and diverticulosis.  She says that few months back she had an episode of diverticulitis.  Did not receive any antibiotics recovered uneventful.  Has noticed a change in her bowel habits with more mucus in it has noticed blood on the tissue paper when she wipes on a few occasions she is not going as often as she does usually in the past had to use MiraLAX but has not done so recently she also feels she has lost some weight.  Her first husband had colon cancer.  She has also had breast cancer herself. Past Medical History:  Diagnosis Date   Allergy    Anxiety    Arthritis    Atypical chest pain    a. 10/2018 MV: small, fixed apical defect possibly 2/2 attenuation artifact. No ischemia.  EF 68%.   Clotting disorder ([place])    Colitis    Depression    Diverticulitis 2013   Gastric ulcer    GERD (gastroesophageal reflux disease)    History of echocardiogram    a. 09/2019 Echo: EF 55-60%, mod LVH. Mildly dil LA. Triv MR/TR.   Hypercholesterolemia    Hypertension    Infiltrating lobular carcinoma of left breast 2011   T2,N0, ER: 90%; PR 0%; Her 2 neu not amplified. [facility]).   Melanoma ([place]) 1997   Melanoma in situ of upper extremity ([place]) 03/19/2011   Persistent atrial fibrillation ([place])    a. CHADS2VASc => 4 (HTN, age x 2,  female)   Personal history of radiation therapy 2011   BREAST CA   Seroma    HISTORY OF LFT BREAST   Sleep apnea    Thyroid cancer ([place]) 1992    Past Surgical History:  Procedure Laterality Date   ABDOMINAL HYSTERECTOMY  1973   partial   BREAST BIOPSY Left 02-13-13   BENIGN BREAST TISSUE WITH CHANGES CONSISTENT WITH FAT NECROSIS   BREAST BIOPSY Left 01/21/2015   bx done in [facility] office 11:00 left 6-8cmfn   BREAST EXCISIONAL BIOPSY Left 1995   neg   BREAST EXCISIONAL BIOPSY Left 2011   Breast cancer radiation   BREAST LUMPECTOMY Left 2011   BREAST CA   CARDIAC CATHETERIZATION     CHOLECYSTECTOMY     COLONOSCOPY  2013   MELANOMA EXCISION     RT UPPER ARM   PACEMAKER IMPLANT N/A 01/10/2020   Procedure: PACEMAKER IMPLANT;  Surgeon: Constance Haw, MD;  Location: [facility];  Service: Cardiovascular;  Laterality: N/A;   PARTIAL HYSTERECTOMY     bleeding, ovaries in place.     THYROID SURGERY  1992   FOR THYROID CANCER   TONSILLECTOMY      Prior to Admission medications   Medication Sig Start Date End Date Taking? Authorizing Provider  acetaminophen (TYLENOL) 650 MG CR tablet Take 650 mg by mouth every 8 (eight) hours as needed for pain.    [provider]  albuterol (VENTOLIN HFA) 108 (90 Base) MCG/ACT inhaler Inhale 1-2 puffs into the lungs every 6 (six) hours as needed for wheezing or shortness of breath. 05/10/21   Parrett, Fonnie Mu, NP  cholecalciferol (VITAMIN D3) 25 MCG (1000 UNIT) tablet Take 1,000 Units by mouth every other day.    [provider]  DULoxetine (CYMBALTA) 60 MG capsule Take 1 capsule (60 mg total) by mouth at bedtime. 11/20/20   Einar Pheasant, MD  ELIQUIS 5 MG TABS tablet TAKE 1 TABLET BY MOUTH TWICE A DAY 05/17/21   Camnitz, Ocie Doyne, MD  furosemide (LASIX) 20 MG tablet TAKE 1 TABLET BY MOUTH DAILY AS NEEDED. TAKE WITH POTASSIUM. 04/26/21   Minna Merritts, MD  gabapentin (NEURONTIN) 300 MG capsule TAKE 2 CAPSULES BY  MOUTH AT BEDTIME 12/30/20   Einar Pheasant, MD  levothyroxine (SYNTHROID) 100 MCG tablet TAKE 1 TABLET EVERY DAY ON EMPTY STOMACHWITH A GLASS OF WATER AT LEAST 30-60 MINBEFORE BREAKFAST 12/09/20   Einar Pheasant, MD  lovastatin (MEVACOR) 40 MG tablet TAKE 1 TABLET BY MOUTH EVERY DAY 06/02/20   Crecencio Mc, MD  metoprolol tartrate (LOPRESSOR) 50 MG tablet Take 1 tablet (50 mg total) by mouth 2 (two) times daily. 02/03/21   Minna Merritts, MD  Multiple Vitamins-Minerals (PRESERVISION AREDS 2) CAPS Take 1 tablet by mouth 2 (two) times daily.    [provider]  omeprazole (PRILOSEC) 20 MG capsule TAKE 1 CAPSULE BY MOUTH ONCE DAILY 10/26/20   Einar Pheasant, MD  Polyethyl Glycol-Propyl Glycol (SYSTANE OP) Place 1 drop into both eyes daily as needed (for dry eyes).    [provider]  polyethylene glycol powder (GLYCOLAX/MIRALAX) 17 GM/SCOOP powder Take 17 g by mouth daily. 09/10/20   Einar Pheasant, MD  potassium chloride (KLOR-CON) 10 MEQ tablet TAKE 2 TABLETS BY MOUTH DAILY AS NEEDED.WITH FUROSEMIDE. 04/26/21   Minna Merritts, MD  potassium chloride SA (KLOR-CON) 20 MEQ tablet Take 20 mEq by mouth 3 (three) times a week.    [provider]  White Petrolatum-Mineral Oil (GENTEAL TEARS NIGHT-TIME OP) Place 1 application into both eyes at bedtime. Night time ointment 3.5g    [provider]    Family History  Problem Relation Age of Onset   Heart disease Mother    Cancer Brother        lung    Cancer Sister        breast   Breast cancer Neg Hx      Social History   Tobacco Use   Smoking status: Never   Smokeless tobacco: Never   Tobacco comments:    never  Substance Use Topics   Alcohol use: Yes    Alcohol/week: 0.0 standard drinks    Comment: social drinking. average times a week   Drug use: No    Allergies as of 05/19/2021 - Review Complete 05/19/2021  Allergen Reaction Noted   Atorvastatin Other (See Comments) 07/01/2015   Lipitor  [atorvastatin calcium] Other (See Comments) 03/09/2011   Penicillins Rash     Review of Systems:    All systems reviewed and negative except where noted in HPI.   Physical Exam:  BP 130/67   Pulse 74   Temp 98.3 F (36.8 C) (Oral)   Ht '5\' 5"'$  (1.651 m)   Wt 172 lb 12.8 oz (78.4  kg)   BMI 28.76 kg/m  No LMP recorded. Patient has had a hysterectomy. Psych:  Alert and cooperative. Normal mood and affect. General:   Alert,  Well-developed, well-nourished, pleasant and cooperative in NAD Head:  Normocephalic and atraumatic. Eyes:  Sclera clear, no icterus.   Conjunctiva pink. Ears:  Normal auditory acuity. Neck:  Supple; no masses or thyromegaly. Lungs:  Respirations even and unlabored.  Clear throughout to auscultation.   No wheezes, crackles, or rhonchi. No acute distress. Heart:  Regular rate and rhythm; no murmurs, clicks, rubs, or gallops. Abdomen:  Normal bowel sounds.  No bruits.  Soft, non-tender and non-distended without masses, hepatosplenomegaly or hernias noted.  No guarding or rebound tenderness.    Neurologic:  Alert and oriented x3;  grossly normal neurologically. Psych:  Alert and cooperative. Normal mood and affect.  Imaging Studies: CT Chest Wo Contrast  Result Date: 05/05/2021 CLINICAL DATA:  87 year old female with history of pulmonary nodule. Follow-up study. Shortness of breath since COVID infection in July 2022. EXAM: CT CHEST WITHOUT CONTRAST TECHNIQUE: Multidetector CT imaging of the chest was performed following the standard protocol without IV contrast. COMPARISON:  Chest CT 11/03/2020. FINDINGS: Cardiovascular: Heart size is normal. There is no significant pericardial fluid, thickening or pericardial calcification. There is aortic atherosclerosis, as well as atherosclerosis of the great vessels of the mediastinum and the coronary arteries, including calcified atherosclerotic plaque in the left main, left anterior descending and left circumflex coronary arteries.  Mild dilatation of the pulmonic trunk (3.6 cm in diameter). Left-sided pacemaker device in place with lead tips terminating in the right atrium and right ventricular apex. Calcifications of the mitral annulus. Mediastinum/Nodes: No pathologically enlarged mediastinal or hilar lymph nodes. Please note that accurate exclusion of hilar adenopathy is limited on noncontrast CT scans. Small hiatal hernia. No axillary lymphadenopathy. Lungs/Pleura: Previously noted ground-glass attenuation nodule of concern in the right lower lobe has resolved indicative of a benign infectious or inflammatory etiology on the prior study. However, today's study demonstrates multiple new pulmonary nodules, many of which are solid in appearance measuring up to 8 mm in the periphery of the right lower lobe (axial image 117 of series 3). 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 No confluent consolidative airspace disease. No pleural effusions. Upper Abdomen: Aortic atherosclerosis.  Status post cholecystectomy. Musculoskeletal: Enlarging partially calcified soft tissue mass in the left breast associated with regional architectural distortion now completely obscuring the fat plane with the underlying pectoralis major musculature, clearly increased in size compared to prior examinations (particularly when compared to more remote prior study from 12/10/2018), concerning for locally recurrent breast cancer, currently measuring 6.6 x 2.6 x 6.9 cm (axial image 68 of series 2 and coronal image 30 of series 5). There are no aggressive appearing lytic or blastic lesions noted in the visualized portions of the skeleton. IMPRESSION: 1. 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 There are several new nodules on today's study, some of which are larger and solid in appearance measuring up to 8 mm. These are nonspecific in the setting of presumed chronic atypical infection, but close attention on follow-up studies is recommended, as the possibility of  metastatic disease to the lungs is not entirely excluded. 2. This is particularly relevant in light of the enlarging partially calcified soft tissue mass in the left breast. This patient has had an apparent post lumpectomy scar in this region for some time, however, this has clearly enlarged over the past 2 years and is now more infiltrative in appearance in direct contact with the underlying left pectoralis major musculature. Findings are highly suspicious for locally recurrent breast cancer. Further clinical evaluation is recommended. 3. Aortic atherosclerosis, in addition to left main and 2 vessel coronary artery disease. 4. Dilatation of the pulmonic trunk (3.6 cm in diameter), concerning for pulmonary arterial hypertension. These results will be called to the ordering clinician or representative by the Radiologist Assistant, and communication documented in the PACS or [employer]. Aortic Atherosclerosis (ICD10-I70.0). Electronically Signed   By: Vinnie Langton M.D.   On: 05/05/2021 07:49   CUP PACEART REMOTE DEVICE CHECK  Result Date: 04/20/2021 Scheduled remote reviewed. Normal device function.  Next remote 91 days- JBox, RN/CVRS  Pulmonary Function Test [facility] Only  Result Date: 05/04/2021 Spirometry Data Is Acceptable and Reproducible Mild Obstructive Airways Disease without  Significant Broncho-Dilator Response +air trapping(incrased RV) Consider outpatient Pulmonary Consultation if needed Clinical Correlation Advised    Assessment and Plan:   SUEZETTE MORIMOTO is a 87 y.o. y/o female has been referred for rectal bleeding.  She also has mucus some change in bowel habits worsening of her constipation and some weight loss.  I will set her up for a colonoscopy within the next few weeks after cardiac clearance as she is on Eliquis and has a pacemaker   I have discussed alternative options, risks & benefits,  which include, but are not limited to, bleeding, infection, perforation,respiratory complication & drug reaction.  The patient agrees with this plan & written consent will be obtained.    Follow up in as needed  Dr Kerri Bellows MD,MRCP(U.K)

## 2021-05-19 NOTE — Telephone Encounter (Signed)
Patient's blood thinner for Eliquis form was faxed to Dr. Donivan Scull office. Awaiting response.

## 2021-05-20 ENCOUNTER — Telehealth: Payer: Self-pay | Admitting: Internal Medicine

## 2021-05-20 LAB — SURGICAL PATHOLOGY

## 2021-05-20 NOTE — Telephone Encounter (Signed)
-----   Message from Minna Merritts, MD sent at 05/19/2021  3:24 PM EDT ----- Regarding: RE: update Okay thanks for the update Do not think there is much to do She has pacemaker so has good backup Followed by Curt Bears Thx TG  ----- Message ----- From: Einar Pheasant, MD Sent: 05/06/2021  10:46 AM EDT To: Minna Merritts, MD Subject: update                                         Ms Cackowski is a mutual pt.  You see her - f/u afib.  She recently had covid and subsequently treated with zpak and prednisone secondary to persistent symptoms.  Breathing is overall better (since when she had covid), but she has still been having issues with sob with exertion and episodes feeling "trembly" inside.  Working with her therapist, it was noted on one occasion a low heart rate - <50. At her visit with me, we discussed completing her pulmonary w/up with f/u CT chest, etc.  She did want me to let you know of the persistent symptoms as above.  Wanted to make sure from a cardiac standpoint nothing more she needed.  I did just receive her CT report and it appears she has a mass in her breast, I am going to get her in for further evaluation and w/up.  Just wanted to give you this update.  Let me know if you need anything more from me or need me to do anything.    Thank you for your help in caring for this patient.   Einar Pheasant

## 2021-05-24 DIAGNOSIS — M1711 Unilateral primary osteoarthritis, right knee: Secondary | ICD-10-CM | POA: Diagnosis not present

## 2021-05-24 DIAGNOSIS — R2689 Other abnormalities of gait and mobility: Secondary | ICD-10-CM | POA: Diagnosis not present

## 2021-05-24 DIAGNOSIS — R2681 Unsteadiness on feet: Secondary | ICD-10-CM | POA: Diagnosis not present

## 2021-05-26 ENCOUNTER — Other Ambulatory Visit: Payer: Self-pay

## 2021-05-26 ENCOUNTER — Other Ambulatory Visit: Payer: Self-pay | Admitting: Internal Medicine

## 2021-05-26 DIAGNOSIS — R2681 Unsteadiness on feet: Secondary | ICD-10-CM | POA: Diagnosis not present

## 2021-05-26 DIAGNOSIS — R2689 Other abnormalities of gait and mobility: Secondary | ICD-10-CM | POA: Diagnosis not present

## 2021-05-26 DIAGNOSIS — M1711 Unilateral primary osteoarthritis, right knee: Secondary | ICD-10-CM | POA: Diagnosis not present

## 2021-05-26 MED ORDER — DULOXETINE HCL 60 MG PO CPEP: 60.0000 mg | ORAL_CAPSULE | Freq: Every day | ORAL | 1 refills | Status: DC

## 2021-06-01 ENCOUNTER — Other Ambulatory Visit: Payer: Medicare Other

## 2021-06-02 DIAGNOSIS — M1711 Unilateral primary osteoarthritis, right knee: Secondary | ICD-10-CM | POA: Diagnosis not present

## 2021-06-02 DIAGNOSIS — R2681 Unsteadiness on feet: Secondary | ICD-10-CM | POA: Diagnosis not present

## 2021-06-02 DIAGNOSIS — R2689 Other abnormalities of gait and mobility: Secondary | ICD-10-CM | POA: Diagnosis not present

## 2021-06-04 DIAGNOSIS — R2689 Other abnormalities of gait and mobility: Secondary | ICD-10-CM | POA: Diagnosis not present

## 2021-06-04 DIAGNOSIS — R2681 Unsteadiness on feet: Secondary | ICD-10-CM | POA: Diagnosis not present

## 2021-06-04 DIAGNOSIS — M1711 Unilateral primary osteoarthritis, right knee: Secondary | ICD-10-CM | POA: Diagnosis not present

## 2021-06-07 ENCOUNTER — Other Ambulatory Visit: Payer: Self-pay

## 2021-06-07 ENCOUNTER — Telehealth: Payer: Self-pay | Admitting: Cardiovascular Disease

## 2021-06-07 ENCOUNTER — Telehealth: Payer: Self-pay

## 2021-06-07 DIAGNOSIS — R2681 Unsteadiness on feet: Secondary | ICD-10-CM | POA: Diagnosis not present

## 2021-06-07 DIAGNOSIS — R2689 Other abnormalities of gait and mobility: Secondary | ICD-10-CM | POA: Diagnosis not present

## 2021-06-07 DIAGNOSIS — M1711 Unilateral primary osteoarthritis, right knee: Secondary | ICD-10-CM | POA: Diagnosis not present

## 2021-06-07 MED ORDER — PEG 3350-KCL-NA BICARB-NACL 420 G PO SOLR: ORAL | 0 refills | Status: DC

## 2021-06-07 NOTE — Telephone Encounter (Signed)
Pt called requesting update on when she was supposed to stop blood thinner, I reviewed chart and did not see for received and scanned in media.... Please call pt with further instructions as the procedure is scheduled for 06/09/2021

## 2021-06-07 NOTE — Telephone Encounter (Signed)
Patient called wanting to know if she is able to hold her blood thinner. However, I had to check with Dr. Rockey Situ and they stated that they had not received a fax. They stated that I needed to fax them the form again. I told them that on 05/19/2021 I had received a confirmation and I told them the number I faxed it to and they confirmed it was the right number. Now awaiting for a STAT response.

## 2021-06-07 NOTE — Telephone Encounter (Signed)
   [facility] Pre-operative Risk Assessment    Patient Name: Kerri Mills  DOB: 1931/07/05 MRN: [id number]  HEARTCARE STAFF:  - IMPORTANT!!!!!! Under Visit Info/Reason for Call, type in Other and utilize the format Clearance MM/DD/YY or Clearance TBD. Do not use dashes or single digits. - Please review there is not already an duplicate clearance open for this procedure. - If request is for dental extraction, please clarify the # of teeth to be extracted. - If the patient is currently at the dentist's office, call Pre-Op Callback Staff (MA/nurse) to input urgent request.  - If the patient is not currently in the dentist office, please route to the Pre-Op pool.  Request for surgical clearance:  What type of surgery is being performed? Colonoscopy   When is this surgery scheduled? 06/09/21  What type of clearance is required (medical clearance vs. Pharmacy clearance to hold med vs. Both)? both  Are there any medications that need to be held prior to surgery and how long? Eliquis instructions   Practice name and name of physician performing surgery? [facility] - Dr Vicente Males   What is the office phone number? [phone]   7.   What is the office fax number? [phone]  8.   Anesthesia type (None, local, MAC, general) ? Not listed    [name] 06/07/2021, 1:22 PM  _________________________________________________________________   (provider comments below)

## 2021-06-07 NOTE — Telephone Encounter (Signed)
Called Dr. Donivan Scull office again and I was told that the blood thinner request form is still under review and that as soon as they have an answer, that they would fax back our form. However, since there will not be enough time, patient would have to reschedule her colonoscopy.  I also called patient but had to leave her a voicemail letting her know that we still do not have clearance as in when she should stop her blood thinner. Therefore she would have to reschedule her procedure since she will not have enough time to hold her eliquis.

## 2021-06-07 NOTE — Telephone Encounter (Addendum)
Left a VM and instructed er to hold eliquis if she isn't already doing so since surgery is in 2 days.  I have also left a detailed message for the requesting office - if they require medical clearance, they should reschedule her prep today.

## 2021-06-08 ENCOUNTER — Ambulatory Visit (INDEPENDENT_AMBULATORY_CARE_PROVIDER_SITE_OTHER): Payer: Medicare Other | Admitting: Pharmacist

## 2021-06-08 ENCOUNTER — Telehealth: Payer: Self-pay | Admitting: Gastroenterology

## 2021-06-08 DIAGNOSIS — R2689 Other abnormalities of gait and mobility: Secondary | ICD-10-CM | POA: Diagnosis not present

## 2021-06-08 DIAGNOSIS — I4891 Unspecified atrial fibrillation: Secondary | ICD-10-CM

## 2021-06-08 DIAGNOSIS — I10 Essential (primary) hypertension: Secondary | ICD-10-CM

## 2021-06-08 DIAGNOSIS — J479 Bronchiectasis, uncomplicated: Secondary | ICD-10-CM

## 2021-06-08 DIAGNOSIS — E039 Hypothyroidism, unspecified: Secondary | ICD-10-CM

## 2021-06-08 DIAGNOSIS — R2681 Unsteadiness on feet: Secondary | ICD-10-CM | POA: Diagnosis not present

## 2021-06-08 DIAGNOSIS — M1711 Unilateral primary osteoarthritis, right knee: Secondary | ICD-10-CM | POA: Diagnosis not present

## 2021-06-08 DIAGNOSIS — K219 Gastro-esophageal reflux disease without esophagitis: Secondary | ICD-10-CM

## 2021-06-08 NOTE — Telephone Encounter (Signed)
Patient with diagnosis of afib on Eliquis for anticoagulation.    Procedure: colonoscopy Date of procedure: 06/09/21  CHA2DS2-VASc Score = 4   This indicates a 4.8% annual risk of stroke. The patient's score is based upon: CHF History: 0 HTN History: 1 Diabetes History: 0 Stroke History: 0 Vascular Disease History: 0 Age Score: 2 Gender Score: 1      CrCl 48.8 ml/min  Per office protocol, patient can hold Eliquis for 2 days prior to procedure.

## 2021-06-08 NOTE — Telephone Encounter (Signed)
Patient was clarified on what she is to do with her blood thinner. Patient understood and had no further questions.

## 2021-06-08 NOTE — Patient Instructions (Signed)
Kerri Mills,   It was great talking to you today!  Talk to Dr. Nicki Reaper about the use of Voltaren gel on your knee. Continue to have your blood pressure and heart rate checked periodically and bring those readings to appointments with Dr. Nicki Reaper, Dr. Rockey Situ, or Dr. Curt Bears.   We recommend you get the influenza vaccine for this season.   We recommend you get the updated bivalent COVID-19 booster, at least 2 months after any prior doses. You can find pharmacies that have this formulation in stock at [email or web address].   Take care!  [name], PharmD [phone]  Visit Information  PATIENT GOALS:  Goals Addressed               This Visit's Progress     Patient Stated     Medication Monitoring (pt-stated)        Patient Goals/Self-Care Activities Over the next 90 days, patient will:  - take medications as prescribed collaborate with provider on medication access solutions        The patient verbalized understanding of instructions, educational materials, and care plan provided today and agreed to receive a mailed copy of patient instructions, educational materials, and care plan.   Plan: Telephone follow up appointment with care management team member scheduled for:  3 month  [name], PharmD, [place], [place] Clinical Pharmacist [employer] [facility] [employer] [phone]

## 2021-06-08 NOTE — Telephone Encounter (Signed)
   Name: Kerri Mills  DOB: 28-Mar-1931  MRN: [id number]   Primary Cardiologist: Ida Rogue, MD  Chart reviewed as part of pre-operative protocol coverage. Patient was contacted 06/08/2021 in reference to pre-operative risk assessment for pending surgery as outlined below.  Kerri Mills was last seen on 02/03/21 by Dr. Rockey Situ.  Since that day, Kerri Mills has done well.  She participates in silver sneaker and zumba without chest pain. She does not have a history of ischemic heart disease.   Per our clinical pharmacist: Per office protocol, patient can hold Eliquis for 2 days prior to procedure  Therefore, based on ACC/AHA guidelines, the patient would be at acceptable risk for the planned procedure without further cardiovascular testing.   The patient was advised that if she develops new symptoms prior to surgery to contact our office to arrange for a follow-up visit, and she verbalized understanding.  I will route this recommendation to the requesting party via Epic fax function and remove from pre-op pool. Please call with questions.  Tami Lin Atif Chapple, PA 06/08/2021, 8:41 AM

## 2021-06-08 NOTE — Telephone Encounter (Signed)
Chatted with Martitza, CMA from requesting office via epic chat. Office make aware that patient is cleared for surgery.

## 2021-06-08 NOTE — Telephone Encounter (Signed)
Pt. Left message she is very upset. Also susan called from pre service center called needing authorization.

## 2021-06-08 NOTE — Telephone Encounter (Signed)
Left VM for procedure tomorrow.

## 2021-06-08 NOTE — Telephone Encounter (Signed)
Returned pt's call regarding precert for colonoscopy scheduled for tomorrow, 06/09/21. Informed pt insurance has been approved.

## 2021-06-08 NOTE — Chronic Care Management (AMB) (Signed)
Chronic Care Management Pharmacy Note  06/08/2021 Name:  Kerri Mills MRN:  [id number] DOB:  06-Sep-1931  Subjective: Kerri Mills is an 87 y.o. year old female who is a primary patient of Einar Pheasant, MD.  The CCM team was consulted for assistance with disease management and care coordination needs.    Engaged with patient by telephone for follow up visit in response to provider referral for pharmacy case management and/or care coordination services.   Consent to Services:  The patient was given information about Chronic Care Management services, agreed to services, and gave verbal consent prior to initiation of services.  Please see initial visit note for detailed documentation.   Patient Care Team: Einar Pheasant, MD as PCP - General (Internal Medicine) Minna Merritts, MD as PCP - Cardiology (Cardiology) Constance Haw, MD as PCP - Electrophysiology (Cardiology) Robert Bellow, MD (General Surgery) Rocco Serene, MD (Internal Medicine) De Hollingshead, RPH-CPP (Pharmacist)  Objective:  Lab Results  Component Value Date   CREATININE 0.84 04/27/2021   CREATININE 0.81 03/15/2021   CREATININE 0.92 06/02/2020    Lab Results  Component Value Date   HGBA1C 6.5 03/15/2021   Last diabetic Eye exam: No results found for: HMDIABEYEEXA  Last diabetic Foot exam: No results found for: HMDIABFOOTEX      Component Value Date/Time   CHOL 163 03/15/2021 1059   TRIG 210.0 (H) 03/15/2021 1059   HDL 35.50 (L) 03/15/2021 1059   CHOLHDL 5 03/15/2021 1059   VLDL 42.0 (H) 03/15/2021 1059   LDLCALC 86 06/02/2020 0947   LDLDIRECT 90.0 03/15/2021 1059    Hepatic Function Latest Ref Rng & Units 05/12/2021 04/27/2021 03/15/2021  Total Protein 6.0 - 8.3 g/dL - 6.3 6.2  Albumin 3.5 - 5.2 g/dL - 3.8 3.9  AST 0 - 37 U/L - 16 14  ALT 0 - 35 U/L - 22 10  Alk Phosphatase 44 - 121 IU/L 142(H) 123(H) 130(H)  Total Bilirubin 0.2 - 1.2 mg/dL - 0.5 0.5  Bilirubin, Direct 0.0 -  0.3 mg/dL - - 0.1    Lab Results  Component Value Date/Time   TSH 1.39 05/12/2021 03:02 PM   TSH 2.71 03/15/2021 10:59 AM    CBC Latest Ref Rng & Units 04/27/2021 03/15/2021 01/10/2020  WBC 4.0 - 10.5 K/uL 8.6 5.4 6.7  Hemoglobin 12.0 - 15.0 g/dL 13.3 12.9 13.0  Hematocrit 36.0 - 46.0 % 41.2 39.3 40.6  Platelets 150.0 - 400.0 K/uL 262.0 196.0 210    Lab Results  Component Value Date/Time   VD25OH 20.63 (L) 03/15/2021 10:59 AM   VD25OH 28.49 (L) 07/19/2018 11:27 AM    Clinical ASCVD: No  The ASCVD Risk score (Arnett DK, et al., 2019) failed to calculate for the following reasons:   The 2019 ASCVD risk score is only valid for ages 67 to 44     CHA2DS2-VASc Score = 4  This indicates a 4.8% annual risk of stroke. The patient's score is based upon: CHF History: 0 HTN History: 1 Diabetes History: 0 Stroke History: 0 Vascular Disease History: 0 Age Score: 2 Gender Score: 1   Social History   Tobacco Use  Smoking Status Never  Smokeless Tobacco Never  Tobacco Comments   never   BP Readings from Last 3 Encounters:  05/19/21 130/67  05/12/21 130/78  05/10/21 114/60   Pulse Readings from Last 3 Encounters:  05/19/21 74  05/12/21 64  05/10/21 68   Wt Readings from  Last 3 Encounters:  05/19/21 172 lb 12.8 oz (78.4 kg)  05/12/21 174 lb 9.6 oz (79.2 kg)  05/10/21 174 lb 3.2 oz (79 kg)    Assessment: Review of patient past medical history, allergies, medications, health status, including review of consultants reports, laboratory and other test data, was performed as part of comprehensive evaluation and provision of chronic care management services.   SDOH:  (Social Determinants of Health) assessments and interventions performed:  SDOH Interventions    Flowsheet Row Most Recent Value  SDOH Interventions   Financial Strain Interventions Intervention Not Indicated       CCM Care Plan  Allergies  Allergen Reactions   Atorvastatin Other (See Comments)    Other  reaction(s): Other (See Comments) STIFFNESS AND SORE STIFFNESS AND SORE   Lipitor [Atorvastatin Calcium] Other (See Comments)    Stiffness & soreness   Penicillins Rash    REACTION: Unknown reaction    Medications Reviewed Today     Reviewed by De Hollingshead, RPH-CPP (Pharmacist) on 06/08/21 at [phone]  Med List Status: <None>   Medication Order Taking? Sig Documenting Provider Last Dose Status Informant  acetaminophen (TYLENOL) 650 MG CR tablet [id number] Yes Take 650 mg by mouth every 8 (eight) hours as needed for pain. [provider] Taking Active   albuterol (VENTOLIN HFA) 108 (90 Base) MCG/ACT inhaler [id number] Yes Inhale 1-2 puffs into the lungs every 6 (six) hours as needed for wheezing or shortness of breath. Parrett, Fonnie Mu, NP Taking Active   cholecalciferol (VITAMIN D3) 25 MCG (1000 UNIT) tablet [id number] Yes Take 1,000 Units by mouth every other day. [provider] Taking Active   DULoxetine (CYMBALTA) 60 MG capsule [id number] Yes TAKE 1 CAPSULE BY MOUTH AT BEDTIME. Einar Pheasant, MD Taking Active   ELIQUIS 5 MG TABS tablet [id number] No TAKE 1 TABLET BY MOUTH TWICE A DAY  Patient not taking: Reported on 06/08/2021   Constance Haw, MD Not Taking Active            Med Note De Hollingshead   Tue Jun 08, 2021  9:11 AM) Last dose evening of 9/11  furosemide (LASIX) 20 MG tablet [id number] Yes TAKE 1 TABLET BY MOUTH DAILY AS NEEDED. TAKE WITH POTASSIUM. Minna Merritts, MD Taking Active   gabapentin (NEURONTIN) 300 MG capsule [id number] Yes TAKE 2 CAPSULES BY MOUTH AT BEDTIME Einar Pheasant, MD Taking Active   levothyroxine (SYNTHROID) 100 MCG tablet [id number] Yes TAKE 1 TABLET EVERY DAY ON EMPTY STOMACHWITH A GLASS OF WATER AT LEAST 30-60 MINBEFORE Laveda Norman, MD Taking Active   lovastatin (MEVACOR) 40 MG tablet [id number] Yes TAKE 1 TABLET BY MOUTH EVERY DAY Crecencio Mc, MD Taking Active   metoprolol tartrate (LOPRESSOR) 50 MG  tablet [id number] Yes Take 1 tablet (50 mg total) by mouth 2 (two) times daily. Minna Merritts, MD Taking Active   Multiple Vitamins-Minerals (PRESERVISION AREDS 2+MULTI VIT PO) [id number] No Take by mouth.  Patient not taking: Reported on 06/08/2021   [provider] Not Taking Active            Med Note Darnelle Maffucci, [place] Jun 08, 2021  9:15 AM) Holding x 5 days prior to colonoscopy  omeprazole (PRILOSEC) 20 MG capsule [id number] Yes TAKE 1 CAPSULE BY MOUTH ONCE DAILY Einar Pheasant, MD Taking Active   Polyethyl Glycol-Propyl Glycol (SYSTANE OP) [id number] Yes Place 1 drop into both eyes daily as needed (for dry eyes). [provider] Taking Active Multiple Informants  polyethylene glycol powder (GLYCOLAX/MIRALAX) 17 GM/SCOOP powder [id number] Yes Take 17 g by mouth daily. Einar Pheasant, MD Taking Active   polyethylene glycol-electrolytes (NULYTELY) 420 g solution [id number] Yes Prepare according to package instructions. Starting at 5:00 PM: Drink one 8 oz glass of mixture every 15 minutes until you finish half of the jug. Five hours prior to procedure, drink 8 oz glass of mixture every 15 minutes until it is all gone. Make sure you do not drink anything 4 hours prior to your procedure. Jonathon Bellows, MD Taking Active   potassium chloride (KLOR-CON) 10 MEQ tablet [id number] Yes TAKE 2 TABLETS BY MOUTH DAILY AS NEEDED.WITH FUROSEMIDE. Minna Merritts, MD Taking Active   White Petrolatum-Mineral Oil (GENTEAL TEARS NIGHT-TIME OP) [id number] Yes Place 1 application into both eyes at bedtime. Night time ointment 3.5g [provider] Taking Active Multiple Informants            Patient Active Problem List   Diagnosis Date Noted   Bronchiectasis without complication ([place]) 72/05/4708   Nasal congestion 05/02/2021   Fatigue 04/27/2021   BRBPR (bright red blood per rectum) 03/15/2021   Neck pain 01/16/2021   UTI (urinary tract infection) 09/21/2020   Constipation  09/21/2020   Back skin lesion 07/12/2020   Vitamin D deficiency 07/12/2020   Hypothyroid 07/07/2020   Mucosal irritation of oral cavity 03/02/2020   Cheek biting 03/02/2020   Anxiety 03/02/2020   LBBB (left bundle branch block) 01/11/2020   Symptomatic bradycardia 01/11/2020   Tachy-brady syndrome ([place])    Heart block 01/10/2020   Syncope 01/08/2020   Mucopurulent chronic bronchitis ([place]) 11/02/2019   Frequent PVCs 10/20/2019   Left hip pain 06/06/2019   Encounter for completion of form with patient 03/03/2019   Abnormal chest CT 12/22/2018   SOB (shortness of breath) 11/25/2018   Varicose veins of both lower extremities with inflammation 09/21/2018   Swelling of lower extremity 08/13/2018   Right knee pain 05/30/2018   Fall 05/30/2018   Sprain of knee 05/30/2018   Abrasion, knee 05/30/2018   Osteoarthritis of knee 05/30/2018   Anemia 02/15/2018   Bilateral knee pain 11/12/2017   Atrial fibrillation with rapid ventricular response ([place])    Demand ischemia ([place])    Essential hypertension    Chest pain 09/29/2017   Dermatitis 09/26/2017   Osteoarthritis of both knees 06/12/2017   Osteoporosis, senile 06/12/2017   Right leg swelling 05/25/2017   Joint ache 05/25/2017   Hypercholesterolemia 02/17/2017   Cough 01/23/2017   Hyperglycemia 06/18/2016   Abnormal mammogram of left breast 01/21/2015   Knee pain, left 11/19/2014   Diverticulosis of colon without hemorrhage 05/07/2014   Diverticulosis of large intestine 05/07/2014   History of breast cancer 01/20/2014   Stress 06/15/2013   Obstructive sleep apnea 06/15/2013   Atrial fibrillation ([place]) 06/15/2013   Posttraumatic hematoma of left breast 02/19/2013   Lump or mass in breast 02/19/2013   History of thyroid cancer 03/19/2011   Malignant neoplasm of thyroid gland ([place]) 03/19/2011   GERD (gastroesophageal reflux disease)    GERD 05/26/2009    Immunization History  Administered Date(s) Administered   Fluad  Quad(high Dose 65+) 06/06/2019, 07/07/2020   Influenza Split 07/18/2014   Influenza, High Dose Seasonal PF 07/01/2015, 06/13/2016, 05/23/2017, 06/25/2018   Influenza,inj,Quad PF,6+ Mos 06/11/2013   Moderna Sars-Covid-2 Vaccination 10/08/2019, 11/05/2019, 07/24/2020   Pneumococcal Conjugate-13 12/18/2013   Pneumococcal Polysaccharide-23 07/01/2015   Tdap 11/15/2016   Zoster Recombinat (Shingrix)  06/25/2018, 07/13/2018, 11/07/2018    Conditions to be addressed/monitored: Atrial Fibrillation and Depression  Care Plan : Medication Management  Updates made by De Hollingshead, RPH-CPP since 06/08/2021 12:00 AM     Problem: Afib, Hypothyroidism, Tremors      Long-Range Goal: Medication Management   Start Date: 10/30/2020  This Visit's Progress: On track  Recent Progress: On track  Priority: High  Note:   Current Barriers:  Unable to independently afford treatment regimen  Pharmacist Clinical Goal(s):  Over the next 90 days, patient will verbalize ability to afford treatment regimen Over the next 90 days, patient will achieve adherence to monitoring guidelines and medication adherence to achieve therapeutic efficacy through collaboration with PharmD and provider.   Interventions: 1:1 collaboration with Einar Pheasant, MD regarding development and update of comprehensive plan of care as evidenced by provider attestation and co-signature Inter-disciplinary care team collaboration (see longitudinal plan of care) Comprehensive medication review performed; medication list updated in electronic medical record  SDOH: Lives in [place]. Has a dog, Precious. Walks twice daily. Lots of friends.  Health Maintenance Yearly influenza vaccination: due - recommended to pursue this season Td/Tdap vaccination: up to date Pneumonia vaccination: up to date COVID vaccinations: due - recommended bivalent booster Shingrix vaccinations: up to date Colonoscopy: due - scheduled for tomorrow.  Notes that she started holding Eliquis yesterday (last dose evening of 9/11).  Bone density scan: up to date Mammogram: up to date - patient relieved that recent breast biopsy was normal  Atrial Fibrillation: Moderately well controlled; current rate control: metoprolol tartrate 50 mg BID; anticoagulant treatment: Eliquis 5 mg BID; furosemide 20 mg PRN w/ potassium 20 mEq - taking a few days per week; follows w/ Dr. Rockey Situ, Dr. Curt Bears- pacemaker in place No Eliquis cost concerns. Home BP readings: last was "good" reports PT checks O2 and pulse, and reports that she was concerned about low HR yesterday. Discussed that metoprolol and pacemaker impact HR. Encouraged to discuss with Dr. Nicki Reaper and Dr. Lorenda Ishihara moving forward. Does note some issues with fatigue sometimes with low HR.  Encouraged continued periodic home BP and HR readings.  Recommended to continue current regimen at this time along with collaboration with cardiology.   SOB and Cough: Improvement in cough, some SOB with exertion (when walking dog or hurrying). Followed w/ Dr. Mortimer Fries, seen Dr. Patsey Berthold more recently; using CPAP; OTC Mucinex, occasional albuterol HFA PRN - only needing a few times weekly Chest CT per pulmonary. Possible MAI. Note to consider AFB moving forward.  Plan for chest Xray (scheduled 05/04/21), PFTs ordered moving to evaluate per pulmonary.  Recommended to continue current regimen, follow up with pulmonary as scheduled.   Mood, Chronic Pain, Tremors: Reports mood is moderately well controlled. Current treatment: duloxetine 60 mg daily, gabapentin 600 mg QPM. Improvement in neck/head pain. Occasional episodes of pain, but overall improved. Notes that knee is still bothering her, even through she has been engaging in physical therapy. Discussed use of Voltaren gel. She will discuss with Dr. Nicki Reaper at next appointment. Denies sedation with gabapentin.  Recommended to continue current regimen at this  time  Hyperlipidemia: Controlled per last lab report; current regimen: lovastatin 40 mg daily Recommended to continue current regimen at this time  Hypothyroidism: Controlled per last lab work; current regimen: levothyroxine 100 mcg daily Confirmed appropriate administration Recommended to continue current regimen at this time  GERD, BRBPR: Controlled per patient report; current regimen: omeprazole 20 mg daily; colonoscopy scheduled tomorrow Recommended to continue  current regimen at this time, along with avoidance of trigger foods.   Supplements: Vitamin D 1000 units every other day, MVI, daily Align probiotic.  Patient Goals/Self-Care Activities Over the next 90 days, patient will:  - take medications as prescribed collaborate with provider on medication access solutions  Follow Up Plan: Telephone follow up appointment with care management team member scheduled for: ~ 12 weeks      Medication Assistance: None required.  Patient affirms current coverage meets needs.  Patient's preferred pharmacy is:  [facility] - [place], [place] - [address] [place] [address] Phone: [phone] Fax: [phone]   Follow Up:  Patient agrees to Care Plan and Follow-up.  Plan: Telephone follow up appointment with care management team member scheduled for:  3 month  [name], PharmD, [place], [place] Clinical Pharmacist [employer] [facility] [employer] [phone]

## 2021-06-09 ENCOUNTER — Ambulatory Visit: Payer: Medicare Other | Admitting: Anesthesiology

## 2021-06-09 ENCOUNTER — Encounter: Payer: Self-pay | Admitting: Gastroenterology

## 2021-06-09 ENCOUNTER — Other Ambulatory Visit: Payer: Self-pay

## 2021-06-09 ENCOUNTER — Ambulatory Visit
Admission: RE | Admit: 2021-06-09 | Discharge: 2021-06-09 | Disposition: A | Discharge status: Home / Self Care | Payer: Medicare Other | Attending: Gastroenterology | Admitting: Gastroenterology

## 2021-06-09 ENCOUNTER — Encounter
Admission: RE | Disposition: A | Discharge status: Home / Self Care | Payer: Self-pay | Source: Home / Self Care | Attending: Gastroenterology

## 2021-06-09 DIAGNOSIS — K5730 Diverticulosis of large intestine without perforation or abscess without bleeding: Secondary | ICD-10-CM | POA: Diagnosis not present

## 2021-06-09 DIAGNOSIS — G4730 Sleep apnea, unspecified: Secondary | ICD-10-CM | POA: Diagnosis not present

## 2021-06-09 DIAGNOSIS — Z803 Family history of malignant neoplasm of breast: Secondary | ICD-10-CM | POA: Diagnosis not present

## 2021-06-09 DIAGNOSIS — Z9049 Acquired absence of other specified parts of digestive tract: Secondary | ICD-10-CM | POA: Insufficient documentation

## 2021-06-09 DIAGNOSIS — Z853 Personal history of malignant neoplasm of breast: Secondary | ICD-10-CM | POA: Diagnosis not present

## 2021-06-09 DIAGNOSIS — Z85820 Personal history of malignant melanoma of skin: Secondary | ICD-10-CM | POA: Insufficient documentation

## 2021-06-09 DIAGNOSIS — K640 First degree hemorrhoids: Secondary | ICD-10-CM | POA: Insufficient documentation

## 2021-06-09 DIAGNOSIS — K921 Melena: Secondary | ICD-10-CM

## 2021-06-09 DIAGNOSIS — Z880 Allergy status to penicillin: Secondary | ICD-10-CM | POA: Insufficient documentation

## 2021-06-09 DIAGNOSIS — Z90711 Acquired absence of uterus with remaining cervical stump: Secondary | ICD-10-CM | POA: Diagnosis not present

## 2021-06-09 DIAGNOSIS — E7800 Pure hypercholesterolemia, unspecified: Secondary | ICD-10-CM | POA: Insufficient documentation

## 2021-06-09 DIAGNOSIS — K625 Hemorrhage of anus and rectum: Secondary | ICD-10-CM | POA: Diagnosis not present

## 2021-06-09 DIAGNOSIS — Z801 Family history of malignant neoplasm of trachea, bronchus and lung: Secondary | ICD-10-CM | POA: Diagnosis not present

## 2021-06-09 DIAGNOSIS — Z79899 Other long term (current) drug therapy: Secondary | ICD-10-CM | POA: Diagnosis not present

## 2021-06-09 DIAGNOSIS — Z8249 Family history of ischemic heart disease and other diseases of the circulatory system: Secondary | ICD-10-CM | POA: Diagnosis not present

## 2021-06-09 DIAGNOSIS — Z7901 Long term (current) use of anticoagulants: Secondary | ICD-10-CM | POA: Diagnosis not present

## 2021-06-09 DIAGNOSIS — D120 Benign neoplasm of cecum: Secondary | ICD-10-CM | POA: Diagnosis not present

## 2021-06-09 DIAGNOSIS — K635 Polyp of colon: Secondary | ICD-10-CM

## 2021-06-09 DIAGNOSIS — R634 Abnormal weight loss: Secondary | ICD-10-CM

## 2021-06-09 DIAGNOSIS — D123 Benign neoplasm of transverse colon: Secondary | ICD-10-CM | POA: Diagnosis not present

## 2021-06-09 DIAGNOSIS — Z85850 Personal history of malignant neoplasm of thyroid: Secondary | ICD-10-CM | POA: Insufficient documentation

## 2021-06-09 DIAGNOSIS — Z8711 Personal history of peptic ulcer disease: Secondary | ICD-10-CM | POA: Insufficient documentation

## 2021-06-09 DIAGNOSIS — G4733 Obstructive sleep apnea (adult) (pediatric): Secondary | ICD-10-CM | POA: Diagnosis not present

## 2021-06-09 DIAGNOSIS — Z955 Presence of coronary angioplasty implant and graft: Secondary | ICD-10-CM | POA: Insufficient documentation

## 2021-06-09 DIAGNOSIS — I4819 Other persistent atrial fibrillation: Secondary | ICD-10-CM | POA: Insufficient documentation

## 2021-06-09 DIAGNOSIS — Z79890 Hormone replacement therapy (postmenopausal): Secondary | ICD-10-CM | POA: Diagnosis not present

## 2021-06-09 DIAGNOSIS — I10 Essential (primary) hypertension: Secondary | ICD-10-CM | POA: Diagnosis not present

## 2021-06-09 DIAGNOSIS — Z888 Allergy status to other drugs, medicaments and biological substances status: Secondary | ICD-10-CM | POA: Insufficient documentation

## 2021-06-09 DIAGNOSIS — K649 Unspecified hemorrhoids: Secondary | ICD-10-CM | POA: Diagnosis not present

## 2021-06-09 HISTORY — PX: COLONOSCOPY WITH PROPOFOL: SHX5780

## 2021-06-09 SURGERY — COLONOSCOPY WITH PROPOFOL
Anesthesia: General

## 2021-06-09 MED ORDER — PROPOFOL 10 MG/ML IV BOLUS
INTRAVENOUS | Status: DC | PRN
  Administered 2021-06-09: 40 mg via INTRAVENOUS

## 2021-06-09 MED ORDER — PROPOFOL 500 MG/50ML IV EMUL
INTRAVENOUS | Status: DC | PRN
  Administered 2021-06-09: 100 ug/kg/min via INTRAVENOUS

## 2021-06-09 MED ORDER — SODIUM CHLORIDE 0.9 % IV SOLN: INTRAVENOUS | Status: DC

## 2021-06-09 MED ORDER — SODIUM CHLORIDE 0.9 % IV SOLN
INTRAVENOUS | Status: AC | PRN
  Administered 2021-06-09: 7 mL via INTRAMUSCULAR

## 2021-06-09 MED ORDER — LIDOCAINE HCL (CARDIAC) PF 100 MG/5ML IV SOSY
PREFILLED_SYRINGE | INTRAVENOUS | Status: DC | PRN
Start: 2021-06-09 — End: 2021-06-09
  Administered 2021-06-09: 50 mg via INTRAVENOUS

## 2021-06-09 NOTE — Op Note (Signed)
[facility] Gastroenterology Patient Name: Kerri Mills Procedure Date: 06/09/2021 1:09 PM MRN: [id number] Account #: [id number] Date of Birth: 25-Aug-1931 Admit Type: Outpatient Age: 87 Room: [facility] ENDO ROOM 3 Gender: Female Note Status: Finalized Instrument Name: Jasper Riling [id number] Procedure:             Colonoscopy Indications:           Rectal bleeding Providers:             Jonathon Bellows MD, MD Referring MD:          Einar Pheasant, MD (Referring MD) Medicines:             Monitored Anesthesia Care Complications:         No immediate complications. Procedure:             Pre-Anesthesia Assessment:                        - Prior to the procedure, a History and Physical was                         performed, and patient medications, allergies and                         sensitivities were reviewed. The patient's tolerance                         of previous anesthesia was reviewed.                        - The risks and benefits of the procedure and the                         sedation options and risks were discussed with the                         patient. All questions were answered and informed                         consent was obtained.                        - ASA Grade Assessment: II - A patient with mild                         systemic disease.                        After obtaining informed consent, the colonoscope was                         passed under direct vision. Throughout the procedure,                         the patient's blood pressure, pulse, and oxygen                         saturations were monitored continuously. The                         Colonoscope was introduced through the anus  and                         advanced to the the cecum, identified by the                         appendiceal orifice. The colonoscopy was performed                         with ease. The patient tolerated the procedure well.                         The  quality of the bowel preparation was good. Findings:      The perianal and digital rectal examinations were normal.      Non-bleeding internal hemorrhoids were found during retroflexion. The       hemorrhoids were large and Grade I (internal hemorrhoids that do not       prolapse).      Multiple small-mouthed diverticula were found in the sigmoid colon.      Two sessile polyps were found in the transverse colon. The polyps were 2       to 3 mm in size. These polyps were removed with a cold biopsy forceps.       Resection and retrieval were complete.      A 10 mm polyp was found in the cecum. The polyp was semi-sessile. The       polyp was removed with a saline injection-lift technique using a hot       snare. Resection and retrieval were complete. To prevent bleeding after       the polypectomy, one hemostatic clip was successfully placed. There was       no bleeding during, or at the end, of the procedure.      The exam was otherwise without abnormality on direct and retroflexion       views. Impression:            - Non-bleeding internal hemorrhoids.                        - Diverticulosis in the sigmoid colon.                        - Two 2 to 3 mm polyps in the transverse colon,                         removed with a cold biopsy forceps. Resected and                         retrieved.                        - One 10 mm polyp in the cecum, removed using                         injection-lift and a hot snare. Resected and                         retrieved. Clip was placed.                        -  The examination was otherwise normal on direct and                         retroflexion views. Recommendation:        - Discharge patient to home (with escort).                        - Resume previous diet.                        - Continue present medications.                        - Await pathology results.                        - Return to my office as previously scheduled.                         - Repeat colonoscopy is not recommended due to current                         age (56 years or older) for surveillance. Procedure Code(s):     --- Professional ---                        [id number], Colonoscopy, flexible; with removal of                         tumor(s), polyp(s), or other lesion(s) by snare                         technique                        45380, 73, Colonoscopy, flexible; with biopsy, single                         or multiple                        45381, Colonoscopy, flexible; with directed submucosal                         injection(s), any substance Diagnosis Code(s):     --- Professional ---                        K64.0, First degree hemorrhoids                        K63.5, Polyp of colon                        K62.5, Hemorrhage of anus and rectum                        K57.30, Diverticulosis of large intestine without                         perforation or abscess without bleeding CPT copyright 2019 American Medical Association. All rights reserved. The codes documented in this report are preliminary and upon  coder review may  be revised to meet current compliance requirements. Jonathon Bellows, MD Jonathon Bellows MD, MD 06/09/2021 1:51:45 PM This report has been signed electronically. Number of Addenda: 0 Note Initiated On: 06/09/2021 1:09 PM Scope Withdrawal Time: 0 hours 16 minutes 37 seconds  Total Procedure Duration: 0 hours 22 minutes 11 seconds  Estimated Blood Loss:  Estimated blood loss: none.      [facility]

## 2021-06-09 NOTE — H&P (Signed)
Jonathon Bellows, MD [address] [place], [place], [place], [address] [address], [place], [place], [place], [address] Phone: [phone]  Fax: [phone]  Primary Care Physician:  Einar Pheasant, MD   Pre-Procedure History & Physical: HPI:  Kerri Mills is a 87 y.o. female is here for an colonoscopy.   Past Medical History:  Diagnosis Date   Allergy    Anxiety    Arthritis    Atypical chest pain    a. 10/2018 MV: small, fixed apical defect possibly 2/2 attenuation artifact. No ischemia.  EF 68%.   Clotting disorder ([place])    Colitis    Depression    Diverticulitis 2013   Gastric ulcer    GERD (gastroesophageal reflux disease)    History of echocardiogram    a. 09/2019 Echo: EF 55-60%, mod LVH. Mildly dil LA. Triv MR/TR.   Hypercholesterolemia    Hypertension    Infiltrating lobular carcinoma of left breast 2011   T2,N0, ER: 90%; PR 0%; Her 2 neu not amplified. [facility]).   Melanoma ([place]) 1997   Melanoma in situ of upper extremity ([place]) 03/19/2011   Persistent atrial fibrillation ([place])    a. CHADS2VASc => 4 (HTN, age x 2, female)   Personal history of radiation therapy 2011   BREAST CA   Seroma    HISTORY OF LFT BREAST   Sleep apnea    Thyroid cancer ([place]) 1992    Past Surgical History:  Procedure Laterality Date   ABDOMINAL HYSTERECTOMY  1973   partial   BREAST BIOPSY Left 02-13-13   BENIGN BREAST TISSUE WITH CHANGES CONSISTENT WITH FAT NECROSIS   BREAST BIOPSY Left 01/21/2015   bx done in [facility] office 11:00 left 6-8cmfn   BREAST EXCISIONAL BIOPSY Left 1995   neg   BREAST EXCISIONAL BIOPSY Left 2011   Breast cancer radiation   BREAST LUMPECTOMY Left 2011   BREAST CA   CARDIAC CATHETERIZATION     CHOLECYSTECTOMY     COLONOSCOPY  2013   MELANOMA EXCISION     RT UPPER ARM   PACEMAKER IMPLANT N/A 01/10/2020   Procedure: PACEMAKER IMPLANT;  Surgeon: Constance Haw, MD;  Location: [facility];  Service: Cardiovascular;   Laterality: N/A;   PARTIAL HYSTERECTOMY     bleeding, ovaries in place.     THYROID SURGERY  1992   FOR THYROID CANCER   TONSILLECTOMY      Prior to Admission medications   Medication Sig Start Date End Date Taking? Authorizing Provider  acetaminophen (TYLENOL) 650 MG CR tablet Take 650 mg by mouth every 8 (eight) hours as needed for pain.   Yes [provider]  albuterol (VENTOLIN HFA) 108 (90 Base) MCG/ACT inhaler Inhale 1-2 puffs into the lungs every 6 (six) hours as needed for wheezing or shortness of breath. 05/10/21  Yes Parrett, Tammy S, NP  cholecalciferol (VITAMIN D3) 25 MCG (1000 UNIT) tablet Take 1,000 Units by mouth every other day.   Yes [provider]  DULoxetine (CYMBALTA) 60 MG capsule TAKE 1 CAPSULE BY MOUTH AT BEDTIME. 05/26/21  Yes Einar Pheasant, MD  ELIQUIS 5 MG TABS tablet TAKE 1 TABLET BY MOUTH TWICE A DAY 05/17/21  Yes Camnitz, Will Hassell Done, MD  furosemide (LASIX) 20 MG tablet TAKE 1 TABLET BY MOUTH DAILY AS NEEDED. TAKE WITH POTASSIUM. 04/26/21  Yes Gollan, Kathlene November, MD  gabapentin (NEURONTIN) 300 MG capsule TAKE 2 CAPSULES BY MOUTH AT BEDTIME 12/30/20  Yes Einar Pheasant, MD  levothyroxine (SYNTHROID) 100 MCG tablet TAKE 1 TABLET EVERY DAY ON EMPTY STOMACHWITH A GLASS OF WATER AT LEAST 30-60 MINBEFORE BREAKFAST 12/09/20  Yes Einar Pheasant, MD  lovastatin (MEVACOR) 40 MG tablet TAKE 1 TABLET BY MOUTH EVERY DAY 06/02/20  Yes Crecencio Mc, MD  metoprolol tartrate (LOPRESSOR) 50 MG tablet Take 1 tablet (50 mg total) by mouth 2 (two) times daily. 02/03/21  Yes Minna Merritts, MD  Multiple Vitamins-Minerals (PRESERVISION AREDS 2+MULTI VIT PO) Take by mouth.   Yes [provider]  omeprazole (PRILOSEC) 20 MG capsule TAKE 1 CAPSULE BY MOUTH ONCE DAILY 10/26/20  Yes Einar Pheasant, MD  Polyethyl Glycol-Propyl Glycol (SYSTANE OP) Place 1 drop into both eyes daily as needed (for dry eyes).   Yes [provider]  polyethylene glycol powder  (GLYCOLAX/MIRALAX) 17 GM/SCOOP powder Take 17 g by mouth daily. 09/10/20  Yes Einar Pheasant, MD  polyethylene glycol-electrolytes (NULYTELY) 420 g solution Prepare according to package instructions. Starting at 5:00 PM: Drink one 8 oz glass of mixture every 15 minutes until you finish half of the jug. Five hours prior to procedure, drink 8 oz glass of mixture every 15 minutes until it is all gone. Make sure you do not drink anything 4 hours prior to your procedure. 06/07/21  Yes Jonathon Bellows, MD  potassium chloride (KLOR-CON) 10 MEQ tablet TAKE 2 TABLETS BY MOUTH DAILY AS NEEDED.WITH FUROSEMIDE. 04/26/21  Yes Gollan, Kathlene November, MD  White Petrolatum-Mineral Oil (GENTEAL TEARS NIGHT-TIME OP) Place 1 application into both eyes at bedtime. Night time ointment 3.5g    [provider]    Allergies as of 05/19/2021 - Review Complete 05/19/2021  Allergen Reaction Noted   Atorvastatin Other (See Comments) 07/01/2015   Lipitor [atorvastatin calcium] Other (See Comments) 03/09/2011   Penicillins Rash     Family History  Problem Relation Age of Onset   Heart disease Mother    Cancer Brother        lung    Cancer Sister        breast   Breast cancer Neg Hx     Social History   Socioeconomic History   Marital status: Widowed    Spouse name: Not on file   Number of children: Not on file   Years of education: Not on file   Highest education level: Not on file  Occupational History   Not on file  Tobacco Use   Smoking status: Never   Smokeless tobacco: Never   Tobacco comments:    never  Substance and Sexual Activity   Alcohol use: Yes    Comment: once in a while   Drug use: No   Sexual activity: Never  Other Topics Concern   Not on file  Social History Narrative   Independent and baseline. Lives by herself   Social Determinants of Health   Financial Resource Strain: Low Risk    Difficulty of Paying Living Expenses: Not hard at all  Food Insecurity: No Food Insecurity    Worried About Charity fundraiser in the Last Year: Never true   Arboriculturist in the Last Year: Never true  Transportation Needs: No Transportation Needs   Lack of Transportation (Medical): No   Lack of Transportation (Non-Medical): No  Physical Activity: Sufficiently Active   Days of Exercise per Week: 4 days   Minutes of Exercise per Session: 40 min  Stress: Stress Concern Present   Feeling of Stress :  To some extent  Social Connections: Unknown   Frequency of Communication with Friends and Family: More than three times a week   Frequency of Social Gatherings with Friends and Family: More than three times a week   Attends Religious Services: Not on file   Active Member of Clubs or Organizations: Not on file   Attends Archivist Meetings: Not on file   Marital Status: Widowed  Human resources officer Violence: Not At Risk   Fear of Current or Ex-Partner: No   Emotionally Abused: No   Physically Abused: No   Sexually Abused: No    Review of Systems: See HPI, otherwise negative ROS  Physical Exam: BP 136/74   Pulse (!) 58   Temp (!) 97.2 F (36.2 C) (Temporal)   Resp 18   Ht '5\' 5"'$  (1.651 m)   Wt 78.5 kg   SpO2 98%   BMI 28.79 kg/m  General:   Alert,  pleasant and cooperative in NAD Head:  Normocephalic and atraumatic. Neck:  Supple; no masses or thyromegaly. Lungs:  Clear throughout to auscultation, normal respiratory effort.    Heart:  +S1, +S2, Regular rate and rhythm, No edema. Abdomen:  Soft, nontender and nondistended. Normal bowel sounds, without guarding, and without rebound.   Neurologic:  Alert and  oriented x4;  grossly normal neurologically.  Impression/Plan: ZURY HAUBER is here for an colonoscopy to be performed for rectal bleeding  Risks, benefits, limitations, and alternatives regarding  colonoscopy have been reviewed with the patient.  Questions have been answered.  All parties agreeable.   Jonathon Bellows, MD  06/09/2021, 1:12 PM

## 2021-06-09 NOTE — Anesthesia Preprocedure Evaluation (Addendum)
Anesthesia Evaluation  Patient identified by MRN, date of birth, ID band Patient awake    Reviewed: Allergy & Precautions, NPO status , Patient's Chart, lab work & pertinent test results, reviewed documented beta blocker date and time   History of Anesthesia Complications Negative for: history of anesthetic complications  Airway Mallampati: II  TM Distance: >3 FB Neck ROM: Full    Dental no notable dental hx.    Pulmonary sleep apnea ,    Pulmonary exam normal        Cardiovascular hypertension, Pt. on medications Normal cardiovascular exam+ dysrhythmias I     Neuro/Psych    GI/Hepatic GERD  Medicated and Controlled,  Endo/Other    Renal/GU      Musculoskeletal   Abdominal Normal abdominal exam  (+)   Peds  Hematology   Anesthesia Other Findings Echo 10/21/2019: Left ventricular ejection fraction, by visual estimation, is 55 to 60%. The left ventricle has normal function. There is moderately increased left ventricular hypertrophy. 2. Left ventricular diastolic parameters are indeterminate. 3. The left ventricle has no regional wall motion abnormalities. 4. Global right ventricle has normal systolic function.The right ventricular size is normal. No increase in right ventricular wall thickness. 5. Left atrial size was mildly dilated. 6. Right atrial size was normal. 7. The mitral valve is normal in structure. Trivial mitral valve regurgitation. No evidence of mitral stenosis. 8. The tricuspid valve is normal in structure. Tricuspid valve regurgitation is trivial. 9. The aortic valve is normal in structure. Aortic valve regurgitation is not visualized. Mild to moderate aortic valve sclerosis/calcification without any evidence of aortic stenosis. 10. The pulmonic valve was normal in structure. Pulmonic valve regurgitation is not visualized. 11. TR signal is inadequate for assessing pulmonary artery systolic  pressure. 12. The inferior vena cava is normal in size with greater than 50% respiratory variability, suggesting right atrial pressure of 3 mmHg.  Pacemaker DDDR A paced 87%  Reproductive/Obstetrics                            Anesthesia Physical Anesthesia Plan  ASA: 3  Anesthesia Plan: General   Post-op Pain Management:    Induction: Intravenous  PONV Risk Score and Plan:   Airway Management Planned: Natural Airway and Simple Face Mask  Additional Equipment: None  Intra-op Plan:   Post-operative Plan:   Informed Consent: I have reviewed the patients History and Physical, chart, labs and discussed the procedure including the risks, benefits and alternatives for the proposed anesthesia with the patient or authorized representative who has indicated his/her understanding and acceptance.       Plan Discussed with: CRNA  Anesthesia Plan Comments: (Will use magnet on the pacemaker for the duration of the procedure)       Anesthesia Quick Evaluation

## 2021-06-09 NOTE — Anesthesia Postprocedure Evaluation (Signed)
Anesthesia Post Note  Patient: Kerri Mills  Procedure(s) Performed: COLONOSCOPY WITH PROPOFOL  Patient location during evaluation: PACU Anesthesia Type: General Level of consciousness: awake and alert Pain management: pain level controlled Vital Signs Assessment: post-procedure vital signs reviewed and stable Respiratory status: spontaneous breathing, nonlabored ventilation, respiratory function stable and patient connected to nasal cannula oxygen Cardiovascular status: blood pressure returned to baseline and stable Postop Assessment: no apparent nausea or vomiting Anesthetic complications: no   No notable events documented.   Last Vitals:  Vitals:   06/09/21 1232 06/09/21 1401  BP: 136/74   Pulse: (!) 58   Resp: 18 20  Temp: (!) 36.2 C   SpO2: 98%     Last Pain:  Vitals:   06/09/21 1401  TempSrc:   PainSc: 0-No pain                 [name] Doyne Keel

## 2021-06-09 NOTE — Transfer of Care (Signed)
Immediate Anesthesia Transfer of Care Note  Patient: Kerri Mills  Procedure(s) Performed: COLONOSCOPY WITH PROPOFOL  Patient Location: PACU and Endoscopy Unit  Anesthesia Type:General  Level of Consciousness: drowsy  Airway & Oxygen Therapy: Patient Spontanous Breathing  Post-op Assessment: Report given to RN and Post -op Vital signs reviewed and stable  Post vital signs: Reviewed and stable  Last Vitals:  Vitals Value Taken Time  BP 121/71 06/09/21 1352  Temp    Pulse 81 06/09/21 1352  Resp 15 06/09/21 1352  SpO2 99 % 06/09/21 1352  Vitals shown include unvalidated device data.  Last Pain:  Vitals:   06/09/21 1351  TempSrc:   PainSc: Asleep         Complications: No notable events documented.

## 2021-06-10 ENCOUNTER — Encounter: Payer: Self-pay | Admitting: Gastroenterology

## 2021-06-10 DIAGNOSIS — N6325 Unspecified lump in the left breast, overlapping quadrants: Secondary | ICD-10-CM | POA: Diagnosis not present

## 2021-06-10 LAB — SURGICAL PATHOLOGY

## 2021-06-11 ENCOUNTER — Encounter: Payer: Self-pay | Admitting: Internal Medicine

## 2021-06-11 ENCOUNTER — Other Ambulatory Visit: Payer: Self-pay | Admitting: Internal Medicine

## 2021-06-11 DIAGNOSIS — M1711 Unilateral primary osteoarthritis, right knee: Secondary | ICD-10-CM | POA: Diagnosis not present

## 2021-06-11 DIAGNOSIS — Z0000 Encounter for general adult medical examination without abnormal findings: Secondary | ICD-10-CM | POA: Insufficient documentation

## 2021-06-11 DIAGNOSIS — R2681 Unsteadiness on feet: Secondary | ICD-10-CM | POA: Diagnosis not present

## 2021-06-11 DIAGNOSIS — R2689 Other abnormalities of gait and mobility: Secondary | ICD-10-CM | POA: Diagnosis not present

## 2021-06-14 ENCOUNTER — Telehealth: Payer: Self-pay | Admitting: Internal Medicine

## 2021-06-14 ENCOUNTER — Encounter: Payer: Self-pay | Admitting: Gastroenterology

## 2021-06-14 ENCOUNTER — Other Ambulatory Visit: Payer: Self-pay | Admitting: Internal Medicine

## 2021-06-14 DIAGNOSIS — M1711 Unilateral primary osteoarthritis, right knee: Secondary | ICD-10-CM | POA: Diagnosis not present

## 2021-06-14 DIAGNOSIS — R2681 Unsteadiness on feet: Secondary | ICD-10-CM | POA: Diagnosis not present

## 2021-06-14 DIAGNOSIS — R2689 Other abnormalities of gait and mobility: Secondary | ICD-10-CM | POA: Diagnosis not present

## 2021-06-14 NOTE — Telephone Encounter (Signed)
Dr Bary Castilla recommended screening mammogra in 11/2021.

## 2021-06-14 NOTE — Telephone Encounter (Signed)
-----   Message from Robert Bellow, MD sent at 06/11/2021 11:24 AM EDT ----- I think the breast issue is resolved. Biopsy in August negative. Repeat assessment w/ ultrasound on 06/10/21 shows no involvement of the pectoralis muscle.  If you will, arrange for her screening mammograms in March 2023 and I will see her in April.  Thanks. Full note to follow.

## 2021-06-16 DIAGNOSIS — R2681 Unsteadiness on feet: Secondary | ICD-10-CM | POA: Diagnosis not present

## 2021-06-16 DIAGNOSIS — R2689 Other abnormalities of gait and mobility: Secondary | ICD-10-CM | POA: Diagnosis not present

## 2021-06-16 DIAGNOSIS — M1711 Unilateral primary osteoarthritis, right knee: Secondary | ICD-10-CM | POA: Diagnosis not present

## 2021-06-17 DIAGNOSIS — R2689 Other abnormalities of gait and mobility: Secondary | ICD-10-CM | POA: Diagnosis not present

## 2021-06-17 DIAGNOSIS — R2681 Unsteadiness on feet: Secondary | ICD-10-CM | POA: Diagnosis not present

## 2021-06-17 DIAGNOSIS — M1711 Unilateral primary osteoarthritis, right knee: Secondary | ICD-10-CM | POA: Diagnosis not present

## 2021-06-21 ENCOUNTER — Ambulatory Visit (INDEPENDENT_AMBULATORY_CARE_PROVIDER_SITE_OTHER): Payer: Medicare Other | Admitting: Internal Medicine

## 2021-06-21 ENCOUNTER — Encounter: Payer: Self-pay | Admitting: Internal Medicine

## 2021-06-21 ENCOUNTER — Other Ambulatory Visit: Payer: Self-pay

## 2021-06-21 ENCOUNTER — Other Ambulatory Visit: Payer: Self-pay | Admitting: Internal Medicine

## 2021-06-21 VITALS — BP 112/72 | HR 68 | Temp 97.9°F | Resp 16 | Ht 65.0 in | Wt 177.0 lb

## 2021-06-21 DIAGNOSIS — R739 Hyperglycemia, unspecified: Secondary | ICD-10-CM

## 2021-06-21 DIAGNOSIS — Z85850 Personal history of malignant neoplasm of thyroid: Secondary | ICD-10-CM | POA: Diagnosis not present

## 2021-06-21 DIAGNOSIS — F439 Reaction to severe stress, unspecified: Secondary | ICD-10-CM

## 2021-06-21 DIAGNOSIS — M25561 Pain in right knee: Secondary | ICD-10-CM | POA: Diagnosis not present

## 2021-06-21 DIAGNOSIS — G4733 Obstructive sleep apnea (adult) (pediatric): Secondary | ICD-10-CM

## 2021-06-21 DIAGNOSIS — Z853 Personal history of malignant neoplasm of breast: Secondary | ICD-10-CM

## 2021-06-21 DIAGNOSIS — K219 Gastro-esophageal reflux disease without esophagitis: Secondary | ICD-10-CM

## 2021-06-21 DIAGNOSIS — D649 Anemia, unspecified: Secondary | ICD-10-CM

## 2021-06-21 DIAGNOSIS — E039 Hypothyroidism, unspecified: Secondary | ICD-10-CM

## 2021-06-21 DIAGNOSIS — Z23 Encounter for immunization: Secondary | ICD-10-CM | POA: Diagnosis not present

## 2021-06-21 DIAGNOSIS — E7800 Pure hypercholesterolemia, unspecified: Secondary | ICD-10-CM

## 2021-06-21 DIAGNOSIS — I4891 Unspecified atrial fibrillation: Secondary | ICD-10-CM | POA: Diagnosis not present

## 2021-06-21 DIAGNOSIS — J479 Bronchiectasis, uncomplicated: Secondary | ICD-10-CM

## 2021-06-21 DIAGNOSIS — R748 Abnormal levels of other serum enzymes: Secondary | ICD-10-CM

## 2021-06-21 DIAGNOSIS — I10 Essential (primary) hypertension: Secondary | ICD-10-CM | POA: Diagnosis not present

## 2021-06-21 DIAGNOSIS — I495 Sick sinus syndrome: Secondary | ICD-10-CM | POA: Diagnosis not present

## 2021-06-21 DIAGNOSIS — F419 Anxiety disorder, unspecified: Secondary | ICD-10-CM

## 2021-06-21 LAB — LIPID PANEL
Cholesterol: 144 mg/dL (ref 0–200)
HDL: 33.3 mg/dL — ABNORMAL LOW (ref >39.00)
LDL Cholesterol: 77 mg/dL (ref 0–99)
NonHDL: 110.82
Total CHOL/HDL Ratio: 4
Triglycerides: 171 mg/dL — ABNORMAL HIGH (ref 0.0–149.0)
VLDL: 34.2 mg/dL (ref 0.0–40.0)

## 2021-06-21 LAB — HEPATIC FUNCTION PANEL
ALT: 25 U/L (ref 0–35)
AST: 22 U/L (ref 0–37)
Albumin: 3.9 g/dL (ref 3.5–5.2)
Alkaline Phosphatase: 124 U/L — ABNORMAL HIGH (ref 39–117)
Bilirubin, Direct: 0.1 mg/dL (ref 0.0–0.3)
Total Bilirubin: 0.5 mg/dL (ref 0.2–1.2)
Total Protein: 6.3 g/dL (ref 6.0–8.3)

## 2021-06-21 LAB — BASIC METABOLIC PANEL
BUN: 12 mg/dL (ref 6–23)
CO2: 30 mEq/L (ref 19–32)
Calcium: 9.7 mg/dL (ref 8.4–10.5)
Chloride: 104 mEq/L (ref 96–112)
Creatinine, Ser: 0.85 mg/dL (ref 0.40–1.20)
GFR: 61.36 mL/min (ref >60.00)
Glucose, Bld: 112 mg/dL — ABNORMAL HIGH (ref 70–99)
Potassium: 4.4 mEq/L (ref 3.5–5.1)
Sodium: 140 mEq/L (ref 135–145)

## 2021-06-21 LAB — HEMOGLOBIN A1C: Hgb A1c MFr Bld: 6.3 % (ref 4.6–6.5)

## 2021-06-21 NOTE — Progress Notes (Signed)
Patient ID: Kerri Mills, female   DOB: 04/11/1931, 87 y.o.   MRN: [id number]   Subjective:    Patient ID: Kerri Mills, female    DOB: Aug 16, 1931, 87 y.o.   MRN: [id number]  This visit occurred during the SARS-CoV-2 public health emergency.  Safety protocols were in place, including screening questions prior to the visit, additional usage of staff PPE, and extensive cleaning of exam room while observing appropriate contact time as indicated for disinfecting solutions.   Patient here for a scheduled follow up.   Chief Complaint  Patient presents with   Hypertension   Hypothyroidism   Hyperlipidemia   .   HPI Recently evaluated by Dr Bary Castilla.  S/p breast biopsy 05/18/21.  Biopsy results - fat necrosis.  Recommended f/u 12/2021. Seeing pulmonary regarding f/u pulmonary changes/nodules noted on chest CT.  Just had f/u CT 04/2021 - and per pulmonary no significant changes and nothing further to do.  CPAP.  Breathing overall stable.  Feeling better.  No increased cough or congestion. Miralax daily - help with bowels.  Persistent right knee pain.  Has to watch when goes to stand.  Using biofreeze. Tries to keep moving.  Given persistence, discussed referral to ortho.  Blood pressure doing well.  Overall feeling better.    Past Medical History:  Diagnosis Date   Allergy    Anxiety    Arthritis    Atypical chest pain    a. 10/2018 MV: small, fixed apical defect possibly 2/2 attenuation artifact. No ischemia.  EF 68%.   Clotting disorder ([place])    Colitis    Depression    Diverticulitis 2013   Gastric ulcer    GERD (gastroesophageal reflux disease)    History of echocardiogram    a. 09/2019 Echo: EF 55-60%, mod LVH. Mildly dil LA. Triv MR/TR.   Hypercholesterolemia    Hypertension    Infiltrating lobular carcinoma of left breast 2011   T2,N0, ER: 90%; PR 0%; Her 2 neu not amplified. [facility]).   Melanoma ([place]) 1997   Melanoma in situ of upper extremity ([place]) 03/19/2011   Persistent  atrial fibrillation ([employer])    a. CHADS2VASc => 4 (HTN, age x 2, female)   Personal history of radiation therapy 2011   BREAST CA   Seroma    HISTORY OF LFT BREAST   Sleep apnea    Thyroid cancer ([place]) 1992   Past Surgical History:  Procedure Laterality Date   ABDOMINAL HYSTERECTOMY  1973   partial   BREAST BIOPSY Left 02-13-13   BENIGN BREAST TISSUE WITH CHANGES CONSISTENT WITH FAT NECROSIS   BREAST BIOPSY Left 01/21/2015   bx done in [facility] 11:00 left 6-8cmfn   BREAST EXCISIONAL BIOPSY Left 1995   neg   BREAST EXCISIONAL BIOPSY Left 2011   Breast cancer radiation   BREAST LUMPECTOMY Left 2011   BREAST CA   CARDIAC CATHETERIZATION     CHOLECYSTECTOMY     COLONOSCOPY  2013   COLONOSCOPY WITH PROPOFOL N/A 06/09/2021   Procedure: COLONOSCOPY WITH PROPOFOL;  Surgeon: Jonathon Bellows, MD;  Location: [facility] ENDOSCOPY;  Service: Gastroenterology;  Laterality: N/A;   MELANOMA EXCISION     RT UPPER ARM   PACEMAKER IMPLANT N/A 01/10/2020   Procedure: PACEMAKER IMPLANT;  Surgeon: Constance Haw, MD;  Location: [facility] LAB;  Service: Cardiovascular;  Laterality: N/A;   PARTIAL HYSTERECTOMY     bleeding, ovaries in place.     THYROID SURGERY  1992   FOR THYROID CANCER   TONSILLECTOMY     Family History  Problem Relation Age of Onset   Heart disease Mother    Cancer Brother        lung    Cancer Sister        breast   Breast cancer Neg Hx    Social History   Socioeconomic History   Marital status: Widowed    Spouse name: Not on file   Number of children: Not on file   Years of education: Not on file   Highest education level: Not on file  Occupational History   Not on file  Tobacco Use   Smoking status: Never   Smokeless tobacco: Never   Tobacco comments:    never  Substance and Sexual Activity   Alcohol use: Yes    Comment: once in a while   Drug use: No   Sexual activity: Never  Other Topics Concern   Not on file  Social History Narrative    Independent and baseline. Lives by herself   Social Determinants of Health   Financial Resource Strain: Low Risk    Difficulty of Paying Living Expenses: Not hard at all  Food Insecurity: No Food Insecurity   Worried About Charity fundraiser in the Last Year: Never true   Arboriculturist in the Last Year: Never true  Transportation Needs: No Transportation Needs   Lack of Transportation (Medical): No   Lack of Transportation (Non-Medical): No  Physical Activity: Sufficiently Active   Days of Exercise per Week: 4 days   Minutes of Exercise per Session: 40 min  Stress: Stress Concern Present   Feeling of Stress : To some extent  Social Connections: Unknown   Frequency of Communication with Friends and Family: More than three times a week   Frequency of Social Gatherings with Friends and Family: More than three times a week   Attends Religious Services: Not on file   Active Member of Clubs or Organizations: Not on file   Attends Archivist Meetings: Not on file   Marital Status: Widowed     Review of Systems  Constitutional:  Negative for appetite change and unexpected weight change.  HENT:  Negative for congestion and sinus pressure.   Respiratory:  Negative for cough and chest tightness.        Breathing stable.   Cardiovascular:  Negative for chest pain, palpitations and leg swelling.  Gastrointestinal:  Negative for abdominal pain, diarrhea, nausea and vomiting.  Genitourinary:  Negative for difficulty urinating and dysuria.  Musculoskeletal:  Negative for myalgias.       Right knee pain.    Skin:  Negative for color change and rash.  Neurological:  Negative for dizziness, light-headedness and headaches.  Psychiatric/Behavioral:  Negative for agitation and dysphoric mood.       Objective:     BP 112/72   Pulse 68   Temp 97.9 F (36.6 C)   Resp 16   Ht _0  (1.651 m)   Wt 177 lb (80.3 kg)   SpO2 97%   BMI 29.45 kg/m  Wt Readings from Last 3  Encounters:  06/21/21 177 lb (80.3 kg)  06/09/21 173 lb (78.5 kg)  05/19/21 172 lb 12.8 oz (78.4 kg)    Physical Exam Vitals reviewed.  Constitutional:      General: She is not in acute distress.    Appearance: Normal appearance.  HENT:  Head: Normocephalic and atraumatic.     Right Ear: External ear normal.     Left Ear: External ear normal.  Eyes:     General: No scleral icterus.       Right eye: No discharge.        Left eye: No discharge.     Conjunctiva/sclera: Conjunctivae normal.  Neck:     Thyroid: No thyromegaly.  Cardiovascular:     Rate and Rhythm: Normal rate and regular rhythm.  Pulmonary:     Effort: No respiratory distress.     Breath sounds: Normal breath sounds. No wheezing.  Abdominal:     General: Bowel sounds are normal.     Palpations: Abdomen is soft.     Tenderness: There is no abdominal tenderness.  Musculoskeletal:        General: No tenderness.     Cervical back: Neck supple. No tenderness.     Comments: Increased knee pain - with weight bearing.    Lymphadenopathy:     Cervical: No cervical adenopathy.  Skin:    Findings: No erythema or rash.  Neurological:     Mental Status: She is alert.  Psychiatric:        Mood and Affect: Mood normal.        Behavior: Behavior normal.     Outpatient Encounter Medications as of 06/21/2021  Medication Sig   acetaminophen (TYLENOL) 650 MG CR tablet Take 650 mg by mouth every 8 (eight) hours as needed for pain.   albuterol (VENTOLIN HFA) 108 (90 Base) MCG/ACT inhaler Inhale 1-2 puffs into the lungs every 6 (six) hours as needed for wheezing or shortness of breath.   cholecalciferol (VITAMIN D3) 25 MCG (1000 UNIT) tablet Take 1,000 Units by mouth every other day.   DULoxetine (CYMBALTA) 60 MG capsule TAKE 1 CAPSULE BY MOUTH AT BEDTIME.   ELIQUIS 5 MG TABS tablet TAKE 1 TABLET BY MOUTH TWICE A DAY   furosemide (LASIX) 20 MG tablet TAKE 1 TABLET BY MOUTH DAILY AS NEEDED. TAKE WITH POTASSIUM.    levothyroxine (SYNTHROID) 100 MCG tablet TAKE 1 TABLET EVERY DAY ON EMPTY STOMACHWITH A GLASS OF WATER AT LEAST 30-60 MINBEFORE BREAKFAST   metoprolol tartrate (LOPRESSOR) 50 MG tablet Take 1 tablet (50 mg total) by mouth 2 (two) times daily.   Multiple Vitamins-Minerals (PRESERVISION AREDS 2+MULTI VIT PO) Take by mouth.   omeprazole (PRILOSEC) 20 MG capsule TAKE 1 CAPSULE BY MOUTH ONCE DAILY   Polyethyl Glycol-Propyl Glycol (SYSTANE OP) Place 1 drop into both eyes daily as needed (for dry eyes).   polyethylene glycol powder (GLYCOLAX/MIRALAX) 17 GM/SCOOP powder Take 17 g by mouth daily.   potassium chloride (KLOR-CON) 10 MEQ tablet TAKE 2 TABLETS BY MOUTH DAILY AS NEEDED.WITH FUROSEMIDE.   White Petrolatum-Mineral Oil (GENTEAL TEARS NIGHT-TIME OP) Place 1 application into both eyes at bedtime. Night time ointment 3.5g   [DISCONTINUED] gabapentin (NEURONTIN) 300 MG capsule TAKE 2 CAPSULES BY MOUTH AT BEDTIME   [DISCONTINUED] lovastatin (MEVACOR) 40 MG tablet TAKE 1 TABLET BY MOUTH EVERY DAY   [DISCONTINUED] polyethylene glycol-electrolytes (NULYTELY) 420 g solution Prepare according to package instructions. Starting at 5:00 PM: Drink one 8 oz glass of mixture every 15 minutes until you finish half of the jug. Five hours prior to procedure, drink 8 oz glass of mixture every 15 minutes until it is all gone. Make sure you do not drink anything 4 hours prior to your procedure.   No facility-administered encounter medications on file as  of 06/21/2021.     Lab Results  Component Value Date   WBC 8.6 04/27/2021   HGB 13.3 04/27/2021   HCT 41.2 04/27/2021   PLT 262.0 04/27/2021   GLUCOSE 112 (H) 06/21/2021   CHOL 144 06/21/2021   TRIG 171.0 (H) 06/21/2021   HDL 33.30 (L) 06/21/2021   LDLDIRECT 90.0 03/15/2021   LDLCALC 77 06/21/2021   ALT 25 06/21/2021   AST 22 06/21/2021   NA 140 06/21/2021   K 4.4 06/21/2021   CL 104 06/21/2021   CREATININE 0.85 06/21/2021   BUN 12 06/21/2021   CO2 30  06/21/2021   TSH 1.39 05/12/2021   INR 1.5 (H) 11/21/2018   HGBA1C 6.3 06/21/2021       Assessment & Plan:   Problem List Items Addressed This Visit     Anemia    Follow cbc.       Anxiety    Continue cymbalta.  Overall appears to be doing better.  Follow.       Atrial fibrillation [facility])    S/p pacemaker placement.  On eliquis and metoprolol.  Overall appears to be stable.      Bronchiectasis without complication ([place])    Seeing pulmonary.  Recent CT scan reviewed and is outlined.  Breathing overall stable.        Elevated alkaline phosphatase level    Recheck liver panel - alkaline phos level.       Essential hypertension - Primary    On metoprolol and lasix.  Blood pressure as outlined.  Appears to be doing better.  Continue current medication regimen.  Follow pressures.  Follow metabolic panel.       Relevant Orders   Basic metabolic panel (Completed)   GERD (gastroesophageal reflux disease)    Upper symptoms appear to be controlled on omeprazole.       History of breast cancer    Mammogram 12/21/20 - Bir[facility] I.  Had CT scan as outlined.  Changes as outlined:  enlarging partially calcified soft tissue mass in the left breast.  Per report - has enlarged over the past 2 years and is now more infiltrative in appearance in direct contact with the underlying left pectoralis major musculature. Findings  - highly suspicious for locally recurrent breast cancer.  Just evaluated by Dr Bary Castilla.  S/p biopsy - fat necrosis.  Recommended f/u in 12/2021.         History of thyroid cancer    On thyroid replacement.  Follow tsh.       Hypercholesterolemia    On lovastatin.  Continue diet and exercise.  Follow fasting profile and liver panel.       Relevant Orders   Hepatic function panel (Completed)   Lipid panel (Completed)   Hyperglycemia    Low carb diet and exercise.  Follow met b and a1c.        Relevant Orders   Hemoglobin A1c (Completed)   Hypothyroid    On  thyroid replacement.  Follow tsh.       Obstructive sleep apnea    Continue cpap.        Right knee pain    Persistent right knee pain as outlined.  Refer to ortho for further evaluation and treatment.       Relevant Orders   Ambulatory referral to Orthopedic Surgery   Stress    Continues on cymbalta.  Overall appears to be doing well.  Follow.       Tachy-brady syndrome ([place])  Breathing better.  Pulmonary w/up reviewed and outlined.  Overall feels stable.  Follow.       Other Visit Diagnoses     Need for immunization against influenza       Relevant Orders   Flu Vaccine QUAD High Dose(Fluad) (Completed)        Einar Pheasant, MD

## 2021-06-22 DIAGNOSIS — M1711 Unilateral primary osteoarthritis, right knee: Secondary | ICD-10-CM | POA: Diagnosis not present

## 2021-06-22 DIAGNOSIS — R2681 Unsteadiness on feet: Secondary | ICD-10-CM | POA: Diagnosis not present

## 2021-06-22 DIAGNOSIS — R2689 Other abnormalities of gait and mobility: Secondary | ICD-10-CM | POA: Diagnosis not present

## 2021-06-23 ENCOUNTER — Telehealth: Payer: Self-pay | Admitting: Gastroenterology

## 2021-06-23 ENCOUNTER — Other Ambulatory Visit: Payer: Self-pay | Admitting: Internal Medicine

## 2021-06-23 NOTE — Telephone Encounter (Signed)
Pt. Calling to get results of procedure done on 06/09/21.

## 2021-06-24 DIAGNOSIS — R2689 Other abnormalities of gait and mobility: Secondary | ICD-10-CM | POA: Diagnosis not present

## 2021-06-24 DIAGNOSIS — M1711 Unilateral primary osteoarthritis, right knee: Secondary | ICD-10-CM | POA: Diagnosis not present

## 2021-06-24 DIAGNOSIS — R2681 Unsteadiness on feet: Secondary | ICD-10-CM | POA: Diagnosis not present

## 2021-06-25 ENCOUNTER — Encounter: Payer: Self-pay | Admitting: Internal Medicine

## 2021-06-25 DIAGNOSIS — I10 Essential (primary) hypertension: Secondary | ICD-10-CM

## 2021-06-25 DIAGNOSIS — R2681 Unsteadiness on feet: Secondary | ICD-10-CM | POA: Diagnosis not present

## 2021-06-25 DIAGNOSIS — E039 Hypothyroidism, unspecified: Secondary | ICD-10-CM | POA: Diagnosis not present

## 2021-06-25 DIAGNOSIS — I4891 Unspecified atrial fibrillation: Secondary | ICD-10-CM | POA: Diagnosis not present

## 2021-06-25 DIAGNOSIS — J479 Bronchiectasis, uncomplicated: Secondary | ICD-10-CM | POA: Diagnosis not present

## 2021-06-25 DIAGNOSIS — R2689 Other abnormalities of gait and mobility: Secondary | ICD-10-CM | POA: Diagnosis not present

## 2021-06-25 DIAGNOSIS — M1711 Unilateral primary osteoarthritis, right knee: Secondary | ICD-10-CM | POA: Diagnosis not present

## 2021-06-25 DIAGNOSIS — R748 Abnormal levels of other serum enzymes: Secondary | ICD-10-CM | POA: Insufficient documentation

## 2021-06-25 NOTE — Telephone Encounter (Signed)
Called patient to let he know that her colonoscopy was normal and no need to repeat it. Patient understood and had no further questions.

## 2021-06-27 ENCOUNTER — Encounter: Payer: Self-pay | Admitting: Internal Medicine

## 2021-06-27 NOTE — Assessment & Plan Note (Signed)
Continue cymbalta.  Overall appears to be doing better.  Follow.

## 2021-06-27 NOTE — Assessment & Plan Note (Signed)
Follow cbc.  

## 2021-06-27 NOTE — Assessment & Plan Note (Signed)
Mammogram 12/21/20 - Birads I.  Had CT scan as outlined.  Changes as outlined:  enlarging partially calcified soft tissue mass in the left breast.  Per report - has enlarged over the past 2 years and is now more infiltrative in appearance in direct contact with the underlying left pectoralis major musculature. Findings  - highly suspicious for locally recurrent breast cancer.  Just evaluated by Dr Bary Castilla.  S/p biopsy - fat necrosis.  Recommended f/u in 12/2021.

## 2021-06-27 NOTE — Assessment & Plan Note (Signed)
S/p pacemaker placement.  On eliquis and metoprolol.  Overall appears to be stable. 

## 2021-06-27 NOTE — Assessment & Plan Note (Signed)
Continue cpap.  

## 2021-06-27 NOTE — Assessment & Plan Note (Signed)
Breathing better.  Pulmonary w/up reviewed and outlined.  Overall feels stable.  Follow.

## 2021-06-27 NOTE — Assessment & Plan Note (Signed)
On lovastatin.  Continue diet and exercise.  Follow fasting profile and liver panel.  

## 2021-06-27 NOTE — Assessment & Plan Note (Signed)
Seeing pulmonary.  Recent CT scan reviewed and is outlined.  Breathing overall stable.

## 2021-06-27 NOTE — Assessment & Plan Note (Signed)
Continues on cymbalta.  Overall appears to be doing well.  Follow.

## 2021-06-27 NOTE — Assessment & Plan Note (Signed)
On metoprolol and lasix.  Blood pressure as outlined.  Appears to be doing better.  Continue current medication regimen.  Follow pressures.  Follow metabolic panel.

## 2021-06-27 NOTE — Assessment & Plan Note (Signed)
On thyroid replacement.  Follow tsh.  

## 2021-06-27 NOTE — Assessment & Plan Note (Signed)
Upper symptoms appear to be controlled on omeprazole.  

## 2021-06-27 NOTE — Assessment & Plan Note (Signed)
Recheck liver panel - alkaline phos level.

## 2021-06-27 NOTE — Assessment & Plan Note (Signed)
Persistent right knee pain as outlined.  Refer to ortho for further evaluation and treatment.

## 2021-06-27 NOTE — Assessment & Plan Note (Signed)
Low carb diet and exercise.  Follow met b and a1c.   

## 2021-06-29 DIAGNOSIS — R2689 Other abnormalities of gait and mobility: Secondary | ICD-10-CM | POA: Diagnosis not present

## 2021-06-29 DIAGNOSIS — R2681 Unsteadiness on feet: Secondary | ICD-10-CM | POA: Diagnosis not present

## 2021-06-29 DIAGNOSIS — M1711 Unilateral primary osteoarthritis, right knee: Secondary | ICD-10-CM | POA: Diagnosis not present

## 2021-06-30 DIAGNOSIS — M1711 Unilateral primary osteoarthritis, right knee: Secondary | ICD-10-CM | POA: Diagnosis not present

## 2021-06-30 DIAGNOSIS — M170 Bilateral primary osteoarthritis of knee: Secondary | ICD-10-CM | POA: Diagnosis not present

## 2021-07-01 DIAGNOSIS — R2681 Unsteadiness on feet: Secondary | ICD-10-CM | POA: Diagnosis not present

## 2021-07-01 DIAGNOSIS — M1711 Unilateral primary osteoarthritis, right knee: Secondary | ICD-10-CM | POA: Diagnosis not present

## 2021-07-01 DIAGNOSIS — R2689 Other abnormalities of gait and mobility: Secondary | ICD-10-CM | POA: Diagnosis not present

## 2021-07-02 DIAGNOSIS — M1711 Unilateral primary osteoarthritis, right knee: Secondary | ICD-10-CM | POA: Diagnosis not present

## 2021-07-02 DIAGNOSIS — R2689 Other abnormalities of gait and mobility: Secondary | ICD-10-CM | POA: Diagnosis not present

## 2021-07-02 DIAGNOSIS — R2681 Unsteadiness on feet: Secondary | ICD-10-CM | POA: Diagnosis not present

## 2021-07-06 DIAGNOSIS — R2689 Other abnormalities of gait and mobility: Secondary | ICD-10-CM | POA: Diagnosis not present

## 2021-07-06 DIAGNOSIS — M1711 Unilateral primary osteoarthritis, right knee: Secondary | ICD-10-CM | POA: Diagnosis not present

## 2021-07-06 DIAGNOSIS — R2681 Unsteadiness on feet: Secondary | ICD-10-CM | POA: Diagnosis not present

## 2021-07-08 DIAGNOSIS — M1711 Unilateral primary osteoarthritis, right knee: Secondary | ICD-10-CM | POA: Diagnosis not present

## 2021-07-08 DIAGNOSIS — R2689 Other abnormalities of gait and mobility: Secondary | ICD-10-CM | POA: Diagnosis not present

## 2021-07-08 DIAGNOSIS — R2681 Unsteadiness on feet: Secondary | ICD-10-CM | POA: Diagnosis not present

## 2021-07-09 DIAGNOSIS — R2689 Other abnormalities of gait and mobility: Secondary | ICD-10-CM | POA: Diagnosis not present

## 2021-07-09 DIAGNOSIS — M1711 Unilateral primary osteoarthritis, right knee: Secondary | ICD-10-CM | POA: Diagnosis not present

## 2021-07-09 DIAGNOSIS — R2681 Unsteadiness on feet: Secondary | ICD-10-CM | POA: Diagnosis not present

## 2021-07-13 DIAGNOSIS — R2681 Unsteadiness on feet: Secondary | ICD-10-CM | POA: Diagnosis not present

## 2021-07-13 DIAGNOSIS — R2689 Other abnormalities of gait and mobility: Secondary | ICD-10-CM | POA: Diagnosis not present

## 2021-07-13 DIAGNOSIS — M1711 Unilateral primary osteoarthritis, right knee: Secondary | ICD-10-CM | POA: Diagnosis not present

## 2021-07-15 DIAGNOSIS — L720 Epidermal cyst: Secondary | ICD-10-CM | POA: Diagnosis not present

## 2021-07-15 DIAGNOSIS — Z872 Personal history of diseases of the skin and subcutaneous tissue: Secondary | ICD-10-CM | POA: Diagnosis not present

## 2021-07-15 DIAGNOSIS — L570 Actinic keratosis: Secondary | ICD-10-CM | POA: Diagnosis not present

## 2021-07-15 DIAGNOSIS — Z85828 Personal history of other malignant neoplasm of skin: Secondary | ICD-10-CM | POA: Diagnosis not present

## 2021-07-15 DIAGNOSIS — L821 Other seborrheic keratosis: Secondary | ICD-10-CM | POA: Diagnosis not present

## 2021-07-15 DIAGNOSIS — Z85820 Personal history of malignant melanoma of skin: Secondary | ICD-10-CM | POA: Diagnosis not present

## 2021-07-15 DIAGNOSIS — L578 Other skin changes due to chronic exposure to nonionizing radiation: Secondary | ICD-10-CM | POA: Diagnosis not present

## 2021-07-15 DIAGNOSIS — B372 Candidiasis of skin and nail: Secondary | ICD-10-CM | POA: Diagnosis not present

## 2021-07-16 DIAGNOSIS — M1711 Unilateral primary osteoarthritis, right knee: Secondary | ICD-10-CM | POA: Diagnosis not present

## 2021-07-16 DIAGNOSIS — R2689 Other abnormalities of gait and mobility: Secondary | ICD-10-CM | POA: Diagnosis not present

## 2021-07-16 DIAGNOSIS — H353221 Exudative age-related macular degeneration, left eye, with active choroidal neovascularization: Secondary | ICD-10-CM | POA: Diagnosis not present

## 2021-07-16 DIAGNOSIS — R2681 Unsteadiness on feet: Secondary | ICD-10-CM | POA: Diagnosis not present

## 2021-07-19 DIAGNOSIS — R2681 Unsteadiness on feet: Secondary | ICD-10-CM | POA: Diagnosis not present

## 2021-07-19 DIAGNOSIS — R2689 Other abnormalities of gait and mobility: Secondary | ICD-10-CM | POA: Diagnosis not present

## 2021-07-19 DIAGNOSIS — M1711 Unilateral primary osteoarthritis, right knee: Secondary | ICD-10-CM | POA: Diagnosis not present

## 2021-07-20 ENCOUNTER — Ambulatory Visit (INDEPENDENT_AMBULATORY_CARE_PROVIDER_SITE_OTHER): Payer: Medicare Other

## 2021-07-20 DIAGNOSIS — I495 Sick sinus syndrome: Secondary | ICD-10-CM | POA: Diagnosis not present

## 2021-07-20 LAB — CUP PACEART REMOTE DEVICE CHECK
Battery Remaining Longevity: 92 mo
Battery Remaining Percentage: 87 %
Battery Voltage: 3.01 V
Brady Statistic AP VP Percent: 3.8 %
Brady Statistic AP VS Percent: 82 %
Brady Statistic AS VP Percent: 1 %
Brady Statistic AS VS Percent: 9.9 %
Brady Statistic RA Percent Paced: 79 %
Brady Statistic RV Percent Paced: 3.9 %
Date Time Interrogation Session: 20221025040014
Implantable Lead Implant Date: 20210416
Implantable Lead Implant Date: 20210416
Implantable Lead Location: 753859
Implantable Lead Location: 753860
Implantable Pulse Generator Implant Date: 20210416
Lead Channel Impedance Value: 350 Ohm
Lead Channel Impedance Value: 490 Ohm
Lead Channel Pacing Threshold Amplitude: 0.75 V
Lead Channel Pacing Threshold Amplitude: 1 V
Lead Channel Pacing Threshold Pulse Width: 0.5 ms
Lead Channel Pacing Threshold Pulse Width: 0.5 ms
Lead Channel Sensing Intrinsic Amplitude: 1 mV
Lead Channel Sensing Intrinsic Amplitude: 12 mV
Lead Channel Setting Pacing Amplitude: 2 V
Lead Channel Setting Pacing Amplitude: 2.5 V
Lead Channel Setting Pacing Pulse Width: 0.5 ms
Lead Channel Setting Sensing Sensitivity: 2 mV
Pulse Gen Model: 2272
Pulse Gen Serial Number: 3813093

## 2021-07-22 DIAGNOSIS — R2689 Other abnormalities of gait and mobility: Secondary | ICD-10-CM | POA: Diagnosis not present

## 2021-07-22 DIAGNOSIS — M1711 Unilateral primary osteoarthritis, right knee: Secondary | ICD-10-CM | POA: Diagnosis not present

## 2021-07-22 DIAGNOSIS — R2681 Unsteadiness on feet: Secondary | ICD-10-CM | POA: Diagnosis not present

## 2021-07-26 DIAGNOSIS — R2689 Other abnormalities of gait and mobility: Secondary | ICD-10-CM | POA: Diagnosis not present

## 2021-07-26 DIAGNOSIS — R2681 Unsteadiness on feet: Secondary | ICD-10-CM | POA: Diagnosis not present

## 2021-07-26 DIAGNOSIS — M1711 Unilateral primary osteoarthritis, right knee: Secondary | ICD-10-CM | POA: Diagnosis not present

## 2021-07-28 ENCOUNTER — Ambulatory Visit: Payer: Medicare Other

## 2021-07-28 ENCOUNTER — Telehealth: Payer: Self-pay

## 2021-07-28 NOTE — Telephone Encounter (Signed)
Unable to reach patient for scheduled AWV on preferred number. No answer. Left message to call the office back and reschedule.

## 2021-07-29 NOTE — Progress Notes (Signed)
Remote pacemaker transmission.   

## 2021-07-30 DIAGNOSIS — R2689 Other abnormalities of gait and mobility: Secondary | ICD-10-CM | POA: Diagnosis not present

## 2021-07-30 DIAGNOSIS — M1711 Unilateral primary osteoarthritis, right knee: Secondary | ICD-10-CM | POA: Diagnosis not present

## 2021-07-30 DIAGNOSIS — R2681 Unsteadiness on feet: Secondary | ICD-10-CM | POA: Diagnosis not present

## 2021-08-01 NOTE — Progress Notes (Signed)
Cardiology Office Note  Date:  08/02/2021   ID:  Kerri Mills, DOB [birth year], MRN [id number]  PCP:  Einar Pheasant, MD   Chief Complaint  Patient presents with   Other    6 Month f/u c/o edema ankles. Meds reviewed verbally with pt.    HPI:  Kerri Mills is a 87 y.o. female with  persistent atrial fibrillation, on anticoagulation SSS, pacer hypertension,  hyperlipidemia,  Demand ischemia Shortness of breath LBBB,  OSA,  breast cancer s/p lumpectomy and XRT,  on the left Hospital admission September 30, 2017 for chest pain Evaluated by cardiology for atrial fibrillation with RVR and chest pain  She presents today for follow-up after recent hospitalization, follow-up of her chest pain and A. Fib.  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  1 VHR (atrially driven) rates 570'V lasting 1 min44sec.  AF burden <1%, longest 19 min Recommendation to Increase metoprolol to 100mg  BID  No side effects on higher metoprolol 80% AP, VS 10% AS, VS  EKG personally reviewed by myself on todays visit Paced rate 60 bpm  Followed by Dr. Mortimer Fries,  pulmonary Needs CT scan, possible MAI on CT  Uses CPAP  January 08, 2020 when she was walking in the cafeteria at her assisted living facility and had sudden onset of shortness of breath and lightheadedness.  She was trying to get to the nearest table but suddenly lost consciousness  Pauses 6 sec on zio monitor off metoprolol  Implantation of a [employer] MRI  model [id number] (serial number  [id number] ) pacemaker on 01/10/20 by Dr. Curt Bears  Echo  12/10/2018  that showed mild wall thickening and bilateral bronchiectasis   hospital 09/30/2017 Nauseous chest pressure short of breath and tremulous.   called EMS and was found to be in atrial fibrillation with rates in the 120s.   admitted at [facility] where she was in atrial fibrillation at 120 bpm.   LBBB was noted on EKG.   INR the day prior to admission was 3.1.   started on a diltiazem infusion  orthopnea overnight.  Bradycardia on diltiazem and metoprolol   PMH:   has a past medical history of Allergy, Anxiety, Arthritis, Atypical chest pain, Clotting disorder ([place]), Colitis, Depression, Diverticulitis (2013), Gastric ulcer, GERD (gastroesophageal reflux disease), History of echocardiogram, Hypercholesterolemia, Hypertension, Infiltrating lobular carcinoma of left breast (2011), Melanoma ([place]) (1997), Melanoma in situ of upper extremity ([place]) (03/19/2011), Persistent atrial fibrillation ([place]), Personal history of radiation therapy (2011), Seroma, Sleep apnea, and Thyroid cancer ([place]) (1992).  PSH:    Past Surgical History:  Procedure Laterality Date   ABDOMINAL HYSTERECTOMY  1973   partial   BREAST BIOPSY Left 02-13-13   BENIGN BREAST TISSUE WITH CHANGES CONSISTENT WITH FAT NECROSIS   BREAST BIOPSY Left 01/21/2015   bx done in [facility] 11:00 left 6-8cmfn   BREAST EXCISIONAL BIOPSY Left 1995   neg   BREAST EXCISIONAL BIOPSY Left 2011   Breast cancer radiation   BREAST LUMPECTOMY Left 2011   BREAST CA   CARDIAC CATHETERIZATION     CHOLECYSTECTOMY  COLONOSCOPY  2013   COLONOSCOPY WITH PROPOFOL N/A 06/09/2021   Procedure: COLONOSCOPY WITH PROPOFOL;  Surgeon: Jonathon Bellows, MD;  [facility] ENDOSCOPY;  Service: Gastroenterology;  Laterality: N/A;   MELANOMA EXCISION     RT UPPER ARM   PACEMAKER IMPLANT N/A 01/10/2020   Procedure: PACEMAKER IMPLANT;  Surgeon: Constance Haw, MD;  Location: [facility] CV LAB;  Service: Cardiovascular;  Laterality: N/A;   PARTIAL HYSTERECTOMY      bleeding, ovaries in place.     THYROID SURGERY  1992   FOR THYROID CANCER   TONSILLECTOMY      Current Outpatient Medications  Medication Sig Dispense Refill   acetaminophen (TYLENOL) 650 MG CR tablet Take 650 mg by mouth every 8 (eight) hours as needed for pain.     albuterol (VENTOLIN HFA) 108 (90 Base) MCG/ACT inhaler Inhale 1-2 puffs into the lungs every 6 (six) hours as needed for wheezing or shortness of breath. 8 g 1   cholecalciferol (VITAMIN D3) 25 MCG (1000 UNIT) tablet Take 1,000 Units by mouth every other day.     DULoxetine (CYMBALTA) 60 MG capsule TAKE 1 CAPSULE BY MOUTH AT BEDTIME. 90 capsule 1   ELIQUIS 5 MG TABS tablet TAKE 1 TABLET BY MOUTH TWICE A DAY 180 tablet 1   gabapentin (NEURONTIN) 300 MG capsule TAKE 2 CAPSULES BY MOUTH AT BEDTIME 180 capsule 1   levothyroxine (SYNTHROID) 100 MCG tablet TAKE 1 TABLET EVERY DAY ON EMPTY STOMACHWITH A GLASS OF WATER AT LEAST 30-60 MINBEFORE BREAKFAST 90 tablet 1   lovastatin (MEVACOR) 40 MG tablet TAKE 1 TABLET BY MOUTH DAILY 90 tablet 3   Multiple Vitamins-Minerals (PRESERVISION AREDS 2+MULTI VIT PO) Take by mouth.     omeprazole (PRILOSEC) 20 MG capsule TAKE 1 CAPSULE BY MOUTH ONCE DAILY 90 capsule 3   Polyethyl Glycol-Propyl Glycol (SYSTANE OP) Place 1 drop into both eyes daily as needed (for dry eyes).     polyethylene glycol powder (GLYCOLAX/MIRALAX) 17 GM/SCOOP powder Take 17 g by mouth daily. 850 g 1   potassium chloride (KLOR-CON) 10 MEQ tablet TAKE 2 TABLETS BY MOUTH DAILY AS NEEDED.WITH FUROSEMIDE. 60 tablet 1   White Petrolatum-Mineral Oil (GENTEAL TEARS NIGHT-TIME OP) Place 1 application into both eyes at bedtime. Night time ointment 3.5g     furosemide (LASIX) 20 MG tablet TAKE 1 TABLET BY MOUTH DAILY AS NEEDED. TAKE WITH POTASSIUM. 90 tablet 3   metoprolol tartrate 75 MG TABS Take 75 mg by mouth 2 (two) times daily. 180 tablet 3   No current facility-administered medications for this visit.     Allergies:    Atorvastatin, Lipitor [atorvastatin calcium], and Penicillins   Social History:  The patient  reports that she has never smoked. She has never used smokeless tobacco. She reports current alcohol use. She reports that she does not use drugs.   Family History:   family history includes Cancer in her brother and sister; Heart disease in her mother.   Review of Systems  Constitutional: Negative.   HENT: Negative.    Eyes: Negative.   Respiratory: Negative.  Negative for shortness of breath.   Cardiovascular: Negative.   Gastrointestinal: Negative.   Genitourinary: Negative.   Musculoskeletal: Negative.   Neurological: Negative.  Negative for dizziness and headaches.  Psychiatric/Behavioral: Negative.    All other systems reviewed and are negative.  PHYSICAL EXAM: VS:  BP 130/78 (BP Location: Left Arm, Patient Position: Sitting, Cuff Size: Normal)   Pulse 60  Ht 5\' 5"  (1.651 m)   Wt 175 lb 4 oz (79.5 kg)   SpO2 98%   BMI 29.16 kg/m  , BMI Body mass index is 29.16 kg/m.  Constitutional:  oriented to person, place, and time. No distress.  HENT:  Head: Grossly normal Eyes:  no discharge. No scleral icterus.  Neck: No JVD, no carotid bruits  Cardiovascular: Regular rate and rhythm, no murmurs appreciated Pulmonary/Chest: Clear to auscultation bilaterally, no wheezes or rales Abdominal: Soft.  no distension.  no tenderness.  Musculoskeletal: Normal range of motion Neurological:  normal muscle tone. Coordination normal. No atrophy Skin: Skin warm and dry Psychiatric: normal affect, pleasant  Recent Labs: 04/27/2021: Hemoglobin 13.3; Platelets 262.0 05/12/2021: TSH 1.39 06/21/2021: ALT 25; BUN 12; Creatinine, Ser 0.85; Potassium 4.4; Sodium 140    Lipid Panel Lab Results  Component Value Date   CHOL 144 06/21/2021   HDL 33.30 (L) 06/21/2021   LDLCALC 77 06/21/2021   TRIG 171.0 (H) 06/21/2021    Wt Readings from Last 3 Encounters:  08/02/21 175 lb 4 oz (79.5 kg)  06/21/21  177 lb (80.3 kg)  06/09/21 173 lb (78.5 kg)     ASSESSMENT AND PLAN:  Persistent atrial fibrillation (HCC) - Plan: EKG 12-Lead Normal sinus rhythm, Pacer downloads reviewed with her Recent message from EP to increase metoprolol to tartrate up to 100 twice daily She is concerned about such a high dose, She has fatigue Recommend for now she increase metoprolol titrate up to 75 twice daily  Frequent PVCs Feels that she is asymptomatic  Essential hypertension - Blood pressure is well controlled on today's visit. No changes made to the medications.  Shortness of breath Appears euvolemic, Recommended regular walking program for conditioning  Pure hypercholesterolemia -  Cholesterol is at goal on the current lipid regimen. No changes to the medications were made.    Total encounter time more than 25 minutes  Greater than 50% was spent in counseling and coordination of care with the patient    Orders Placed This Encounter  Procedures   EKG 12-Lead      Signed, Esmond Plants, M.D., Ph.D. 08/02/2021  [facility] [place], [place]

## 2021-08-02 ENCOUNTER — Other Ambulatory Visit: Payer: Self-pay

## 2021-08-02 ENCOUNTER — Ambulatory Visit (INDEPENDENT_AMBULATORY_CARE_PROVIDER_SITE_OTHER): Payer: Medicare Other | Admitting: Cardiovascular Disease

## 2021-08-02 ENCOUNTER — Encounter: Payer: Self-pay | Admitting: Cardiovascular Disease

## 2021-08-02 VITALS — BP 130/78 | HR 60 | Ht 65.0 in | Wt 175.2 lb

## 2021-08-02 DIAGNOSIS — E7800 Pure hypercholesterolemia, unspecified: Secondary | ICD-10-CM | POA: Diagnosis not present

## 2021-08-02 DIAGNOSIS — E782 Mixed hyperlipidemia: Secondary | ICD-10-CM

## 2021-08-02 DIAGNOSIS — R0789 Other chest pain: Secondary | ICD-10-CM | POA: Diagnosis not present

## 2021-08-02 DIAGNOSIS — I495 Sick sinus syndrome: Secondary | ICD-10-CM | POA: Diagnosis not present

## 2021-08-02 DIAGNOSIS — R55 Syncope and collapse: Secondary | ICD-10-CM | POA: Diagnosis not present

## 2021-08-02 DIAGNOSIS — I4819 Other persistent atrial fibrillation: Secondary | ICD-10-CM | POA: Diagnosis not present

## 2021-08-02 DIAGNOSIS — I10 Essential (primary) hypertension: Secondary | ICD-10-CM | POA: Diagnosis not present

## 2021-08-02 MED ORDER — METOPROLOL TARTRATE 75 MG PO TABS: 75.0000 mg | ORAL_TABLET | Freq: Two times a day (BID) | ORAL | 3 refills | Status: DC

## 2021-08-02 MED ORDER — FUROSEMIDE 20 MG PO TABS: ORAL_TABLET | ORAL | 3 refills | Status: DC

## 2021-08-02 NOTE — Patient Instructions (Addendum)
Medication Instructions:  Please increase  the metoprolol tartrate up to 75 mg twice a day  If you need a refill on your cardiac medications before your next appointment, please call your pharmacy.   Lab work: No new labs needed  Testing/Procedures: No new testing needed  Follow-Up: At [facility], you and your health needs are our priority.  As part of our continuing mission to provide you with exceptional heart care, we have created designated Provider Care Teams.  These Care Teams include your primary Cardiologist (physician) and Advanced Practice Providers (APPs -  Physician Assistants and Nurse Practitioners) who all work together to provide you with the care you need, when you need it.  You will need a follow up appointment in 12 months  Providers on your designated Care Team:   Murray Hodgkins, NP Christell Faith, PA-C Cadence Kathlen Mody, [place]  COVID-19 Vaccine Information can be found at: [email or web address] For questions related to vaccine distribution or appointments, please email [email or web address] or call [phone].

## 2021-09-01 ENCOUNTER — Ambulatory Visit: Payer: Medicare Other | Admitting: Pharmacist

## 2021-09-01 DIAGNOSIS — I4891 Unspecified atrial fibrillation: Secondary | ICD-10-CM

## 2021-09-01 DIAGNOSIS — I10 Essential (primary) hypertension: Secondary | ICD-10-CM

## 2021-09-01 DIAGNOSIS — E7800 Pure hypercholesterolemia, unspecified: Secondary | ICD-10-CM

## 2021-09-01 NOTE — Chronic Care Management (AMB) (Signed)
Chronic Care Management CCM Pharmacy Note  09/01/2021 Name:  Kerri Mills MRN:  [id number] DOB:  10-01-1930  Summary: - Doing well. Tolerating medications. Discussed vaccine recommendations  Recommendations/Changes made from today's visit: - Continue current regimen at this time. Closing CCM case due to goals of care being met  Subjective: Kerri Mills is an 87 y.o. year old female who is a primary patient of Einar Pheasant, MD.  The CCM team was consulted for assistance with disease management and care coordination needs.    Engaged with patient by telephone for follow up visit for pharmacy case management and/or care coordination services.   Objective:  Medications Reviewed Today     Reviewed by De Hollingshead, RPH-CPP (Pharmacist) on 09/01/21 at [place] List Status: <None>   Medication Order Taking? Sig Documenting Provider Last Dose Status Informant  acetaminophen (TYLENOL) 650 MG CR tablet [id number] Yes Take 650 mg by mouth every 8 (eight) hours as needed for pain. [provider] Taking Active   albuterol (VENTOLIN HFA) 108 (90 Base) MCG/ACT inhaler [id number] No Inhale 1-2 puffs into the lungs every 6 (six) hours as needed for wheezing or shortness of breath.  Patient not taking: Reported on 09/01/2021   Parrett, Fonnie Mu, NP Not Taking Active   cholecalciferol (VITAMIN D3) 25 MCG (1000 UNIT) tablet [id number] Yes Take 1,000 Units by mouth daily. [provider] Taking Active   DULoxetine (CYMBALTA) 60 MG capsule [id number] Yes TAKE 1 CAPSULE BY MOUTH AT BEDTIME. Einar Pheasant, MD Taking Active   ELIQUIS 5 MG TABS tablet [id number] Yes TAKE 1 TABLET BY MOUTH TWICE A DAY Camnitz, Ocie Doyne, MD Taking Active            Med Note De Hollingshead   Wed Sep 01, 2021  9:01 AM)    furosemide (LASIX) 20 MG tablet [id number] Yes TAKE 1 TABLET BY MOUTH DAILY AS NEEDED. TAKE WITH POTASSIUM. Minna Merritts, MD Taking Active   gabapentin (NEURONTIN) 300 MG  capsule [id number] Yes TAKE 2 CAPSULES BY MOUTH AT BEDTIME Einar Pheasant, MD Taking Active   levothyroxine (SYNTHROID) 100 MCG tablet [id number] Yes TAKE 1 TABLET EVERY DAY ON EMPTY STOMACHWITH A GLASS OF WATER AT LEAST 30-60 MINBEFORE Laveda Norman, MD Taking Active   lovastatin (MEVACOR) 40 MG tablet [id number] Yes TAKE 1 TABLET BY MOUTH DAILY Crecencio Mc, MD Taking Active   metoprolol tartrate 75 MG TABS [id number] Yes Take 75 mg by mouth 2 (two) times daily. Minna Merritts, MD Taking Active   Multiple Vitamins-Minerals (PRESERVISION AREDS 2+MULTI VIT PO) [id number] Yes Take by mouth. [provider] Taking Active            Med Note Terrilee Croak Aug 02, 2021 12:02 PM)    omeprazole (PRILOSEC) 20 MG capsule [id number] Yes TAKE 1 CAPSULE BY MOUTH ONCE DAILY Einar Pheasant, MD Taking Active   Polyethyl Glycol-Propyl Glycol (SYSTANE OP) [id number] Yes Place 1 drop into both eyes daily as needed (for dry eyes). [provider] Taking Active Multiple Informants  polyethylene glycol powder (GLYCOLAX/MIRALAX) 17 GM/SCOOP powder [id number] Yes Take 17 g by mouth daily. Einar Pheasant, MD Taking Active   potassium chloride (KLOR-CON) 10 MEQ tablet [id number] Yes TAKE 2 TABLETS BY MOUTH DAILY AS NEEDED.WITH FUROSEMIDE. Minna Merritts, MD Taking Active   White Petrolatum-Mineral Oil (GENTEAL TEARS NIGHT-TIME OP) [id number] Yes Place 1 application into both eyes at bedtime. Night time ointment 3.5g  [provider] Taking Active Multiple Informants            Pertinent Labs:   Lab Results  Component Value Date   HGBA1C 6.3 06/21/2021   Lab Results  Component Value Date   CHOL 144 06/21/2021   HDL 33.30 (L) 06/21/2021   LDLCALC 77 06/21/2021   LDLDIRECT 90.0 03/15/2021   TRIG 171.0 (H) 06/21/2021   CHOLHDL 4 06/21/2021   Lab Results  Component Value Date   CREATININE 0.85 06/21/2021   BUN 12 06/21/2021   NA 140 06/21/2021   K 4.4  06/21/2021   CL 104 06/21/2021   CO2 30 06/21/2021    SDOH:  (Social Determinants of Health) assessments and interventions performed:  SDOH Interventions    Flowsheet Row Most Recent Value  SDOH Interventions   Financial Strain Interventions Intervention Not Indicated       CCM Care Plan  Review of patient past medical history, allergies, medications, health status, including review of consultants reports, laboratory and other test data, was performed as part of comprehensive evaluation and provision of chronic care management services.   Care Plan : Medication Management  Updates made by De Hollingshead, RPH-CPP since 09/01/2021 12:00 AM     Problem: Afib, Hypothyroidism, Tremors Resolved 09/01/2021     Long-Range Goal: Medication Management Completed 09/01/2021  Start Date: 10/30/2020  Recent Progress: On track  Priority: High  Note:   Current Barriers:  Unable to independently afford treatment regimen  Pharmacist Clinical Goal(s):  Over the next 90 days, patient will verbalize ability to afford treatment regimen Over the next 90 days, patient will achieve adherence to monitoring guidelines and medication adherence to achieve therapeutic efficacy through collaboration with PharmD and provider.   Interventions: 1:1 collaboration with Einar Pheasant, MD regarding development and update of comprehensive plan of care as evidenced by provider attestation and co-signature Inter-disciplinary care team collaboration (see longitudinal plan of care) Comprehensive medication review performed; medication list updated in electronic medical record  Health Maintenance Yearly influenza vaccination: up to date  Td/Tdap vaccination: up to date Pneumonia vaccination: up to date COVID vaccinations: due - recommended bivalent booster Shingrix vaccinations: due - recommended to get second dose as soon as possible Colonoscopy: up to date Bone density scan: up to date Mammogram: up to  date  Atrial Fibrillation: Moderately well controlled; current rate control: metoprolol tartrate 75 mg BID; anticoagulant treatment: Eliquis 5 mg BID; furosemide 20 mg PRN w/ potassium 20 mEq - not needing lately follows w/ Dr. Rockey Situ, Dr. Curt Bears- pacemaker in place No Eliquis cost concerns. Home BP readings: has not checked since metoprolol dose increase Encouraged continued periodic home BP and HR readings to evaluate impact of medications. Recommended to continue current regimen at this time along with collaboration with cardiology.   SOB and Cough: Well controlled recently; reports little use of albuterol recently; followed w/ Dr. Mortimer Fries, seen Dr. Patsey Berthold more recently; using CPAP; OTC Mucinex Chest CT per pulmonary. Possible MAI. Note to consider AFB moving forward.  Recommended to continue current regimen  Mood, Chronic Pain, Tremors: Reports mood is moderately well controlled. Current treatment: duloxetine 60 mg daily, gabapentin 600 mg QPM. Improvement in pain with recent injection. Does note some knee pain worsening recently, she plans to call [facility] for follow up. Encouraged consideration for Voltaren gel. She uses doterra deep blue from her daughter.  Does report increased pain in her feet recently. Calculated CrCl with adjusted body weight is 48 mL/min, max recommended dose  of gabapentin is total of 900 mg daily. Encouraged her to discuss with PCP at next visit.   Hyperlipidemia: Controlled per last lab report; current regimen: lovastatin 40 mg daily Recommended to continue current regimen at this time  Hypothyroidism: Controlled per last lab work; current regimen: levothyroxine 100 mcg daily Recommended to continue current regimen at this time  GERD, BRBPR: Controlled per patient report; current regimen: omeprazole 20 mg daily; Colonoscopy normal, noted no need for follow  Recommended to continue current regimen at this time, along with avoidance of trigger foods.    Supplements: Vitamin D 1000 units every other day, MVI, daily Align probiotic.  Patient Goals/Self-Care Activities Over the next 90 days, patient will:  - take medications as prescribed collaborate with provider on medication access solutions      Plan: Goals of care met. Closing Ccm case  [name], PharmD, [place], [place] Clinical Pharmacist [employer] at [employer] [phone]

## 2021-09-01 NOTE — Patient Instructions (Signed)
Kerri Mills,   It was great talking to you!  We recommend you get the updated bivalent COVID-19 booster, at least 2 months after any prior doses. You may consider delaying a booster dose by 3 months from a prior episode of COVID-19 per the CDC.   You can find pharmacies that have this formulation in stock at [email or web address]   We also recommend you go ahead and get the second dose of the Shingrix vaccine at your local pharmacy. We have documented that you received your first dose on 06/25/2018. The CDC does not require you to restart the series, but encourage you to get the second doses as soon as possible. Medicare plans should cover the vaccine with a $0 copay starting in 2023.   Check your blood pressure periodically at home, to keep an eye on the impact of the metoprolol dose increase.   If your knee arthritis continues to bother you, I'd advise over the counter Voltaren gel. You can use this as needed for joint pain.   Take care!  [name], PharmD  Visit Information  Following are the goals we discussed today:  Patient Goals/Self-Care Activities Over the next 90 days, patient will:  - take medications as prescribed collaborate with provider on medication access solutions        Plan: Goals of care met. Closing Ccm case   [name], PharmD, [place], CPP Clinical Pharmacist [place] at [facility] [phone]   Please call the care guide team at [phone] if you need to cancel or reschedule your appointment.   The patient verbalized understanding of instructions, educational materials, and care plan provided today and agreed to receive a mailed copy of patient instructions, educational materials, and care plan.

## 2021-09-21 ENCOUNTER — Ambulatory Visit (INDEPENDENT_AMBULATORY_CARE_PROVIDER_SITE_OTHER): Payer: Medicare Other | Admitting: Internal Medicine

## 2021-09-21 ENCOUNTER — Other Ambulatory Visit: Payer: Self-pay

## 2021-09-21 VITALS — BP 112/76 | HR 71 | Temp 98.1°F | Resp 16 | Ht 65.0 in | Wt 177.0 lb

## 2021-09-21 DIAGNOSIS — J479 Bronchiectasis, uncomplicated: Secondary | ICD-10-CM | POA: Diagnosis not present

## 2021-09-21 DIAGNOSIS — R739 Hyperglycemia, unspecified: Secondary | ICD-10-CM | POA: Diagnosis not present

## 2021-09-21 DIAGNOSIS — G4733 Obstructive sleep apnea (adult) (pediatric): Secondary | ICD-10-CM

## 2021-09-21 DIAGNOSIS — I700 Atherosclerosis of aorta: Secondary | ICD-10-CM | POA: Diagnosis not present

## 2021-09-21 DIAGNOSIS — I4891 Unspecified atrial fibrillation: Secondary | ICD-10-CM

## 2021-09-21 DIAGNOSIS — D649 Anemia, unspecified: Secondary | ICD-10-CM

## 2021-09-21 DIAGNOSIS — I10 Essential (primary) hypertension: Secondary | ICD-10-CM | POA: Diagnosis not present

## 2021-09-21 DIAGNOSIS — J411 Mucopurulent chronic bronchitis: Secondary | ICD-10-CM | POA: Diagnosis not present

## 2021-09-21 DIAGNOSIS — Z853 Personal history of malignant neoplasm of breast: Secondary | ICD-10-CM

## 2021-09-21 DIAGNOSIS — R0989 Other specified symptoms and signs involving the circulatory and respiratory systems: Secondary | ICD-10-CM

## 2021-09-21 DIAGNOSIS — E039 Hypothyroidism, unspecified: Secondary | ICD-10-CM

## 2021-09-21 DIAGNOSIS — I495 Sick sinus syndrome: Secondary | ICD-10-CM

## 2021-09-21 DIAGNOSIS — F419 Anxiety disorder, unspecified: Secondary | ICD-10-CM

## 2021-09-21 DIAGNOSIS — K219 Gastro-esophageal reflux disease without esophagitis: Secondary | ICD-10-CM | POA: Diagnosis not present

## 2021-09-21 DIAGNOSIS — R748 Abnormal levels of other serum enzymes: Secondary | ICD-10-CM

## 2021-09-21 DIAGNOSIS — E7800 Pure hypercholesterolemia, unspecified: Secondary | ICD-10-CM

## 2021-09-21 DIAGNOSIS — R251 Tremor, unspecified: Secondary | ICD-10-CM

## 2021-09-21 DIAGNOSIS — M25561 Pain in right knee: Secondary | ICD-10-CM | POA: Diagnosis not present

## 2021-09-21 DIAGNOSIS — Z85850 Personal history of malignant neoplasm of thyroid: Secondary | ICD-10-CM

## 2021-09-21 MED ORDER — AZELASTINE HCL 0.1 % NA SOLN: 1.0000 | Freq: Two times a day (BID) | NASAL | 0 refills | Status: DC

## 2021-09-21 NOTE — Progress Notes (Signed)
tPatient ID: Kerri Mills, female   DOB: December 07, 1930, 87 y.o.   MRN: [id number]   Subjective:    Patient ID: Kerri Mills, female    DOB: 1930-10-27, 87 y.o.   MRN: [id number]  This visit occurred during the SARS-CoV-2 public health emergency.  Safety protocols were in place, including screening questions prior to the visit, additional usage of staff PPE, and extensive cleaning of exam room while observing appropriate contact time as indicated for disinfecting solutions.   Patient here for a scheduled follow up .   HPI Here to follow up regarding her blood pressure, afib and cholesterol.  Had f/u with Dr Rockey Situ 08/02/21.  EP had recommended increase metoprolol to 166m bid.  Elected to increase metoprolol to 741mbid.  No chest pain reported.  Breathing overall stable.  Did report that starting 4-5 days ago, noticed sneezing and runny nose.  No chest congestion or increased cough.  No acid reflux reported.  No abdominal pain or bowel change reported.  Does report noticing tremor.  Request neurology evaluation.  Some increased urinary frequency.  Discussed pelvic floor therapy.  She wants to hold on any further w/up or intervention at this time.  She saw ortho for her right knee.  Some persistent issues.  Desires to hold on further evaluation at this time.     Past Medical History:  Diagnosis Date   Allergy    Anxiety    Arthritis    Atypical chest pain    a. 10/2018 MV: small, fixed apical defect possibly 2/2 attenuation artifact. No ischemia.  EF 68%.   Clotting disorder ([facility]   Colitis    Depression    Diverticulitis 2013   Gastric ulcer    GERD (gastroesophageal reflux disease)    History of echocardiogram    a. 09/2019 Echo: EF 55-60%, mod LVH. Mildly dil LA. Triv MR/TR.   Hypercholesterolemia    Hypertension    Infiltrating lobular carcinoma of left breast 2011   T2,N0, ER: 90%; PR 0%; [facility] 2 neu [facility]. [facility]   Melanoma ([facility]   Melanoma in situ of upper  extremity ([facility]/23/2012   Persistent atrial fibrillation ([facility]   a. CHADS2VASc => 4 (HTN, age x 2, female)   Personal history of radiation therapy 2011   BREAST CA   Seroma    HISTORY OF LFT BREAST   Sleep apnea    Thyroid cancer ([facility]1992   Past Surgical History:  Procedure Laterality Date   ABDOMINAL HYSTERECTOMY  1973   partial   BREAST BIOPSY Left 02-13-13   BENIGN BREAST TISSUE WITH CHANGES CONSISTENT WITH FAT NECROSIS   BREAST BIOPSY Left 01/21/2015   bx done in [place] office 11:00 left 6-8cmfn   BREAST EXCISIONAL BIOPSY Left 1995   neg   BREAST EXCISIONAL BIOPSY Left 2011   Breast cancer radiation   BREAST LUMPECTOMY Left 2011   BREAST CA   CARDIAC CATHETERIZATION     CHOLECYSTECTOMY     COLONOSCOPY  2013   COLONOSCOPY WITH PROPOFOL N/A 06/09/2021   Procedure: COLONOSCOPY WITH PROPOFOL;  Surgeon: AnJonathon BellowsMD;  [facility];  Service: Gastroenterology;  Laterality: N/A;   MELANOMA EXCISION     RT UPPER ARM   PACEMAKER IMPLANT N/A 01/10/2020   Procedure: PACEMAKER IMPLANT;  Surgeon: CaConstance HawMD;  [facility];  Service: Cardiovascular;  Laterality: N/A;   PARTIAL HYSTERECTOMY     bleeding, ovaries  in place.     THYROID SURGERY  1992   FOR THYROID CANCER   TONSILLECTOMY     Family History  Problem Relation Age of Onset   Heart disease Mother    Cancer Brother        lung    Cancer Sister        breast   Breast cancer Neg Hx    Social History   Socioeconomic History   Marital status: Widowed    Spouse name: Not on file   Number of children: Not on file   Years of education: Not on file   Highest education level: Not on file  Occupational History   Not on file  Tobacco Use   Smoking status: Never   Smokeless tobacco: Never   Tobacco comments:    never  Substance and Sexual Activity   Alcohol use: Yes    Comment: once in a while   Drug use: No   Sexual activity: Never  Other Topics Concern   Not on  file  Social History Narrative   Independent and baseline. Lives by herself   Social Determinants of Health   Financial Resource Strain: Low Risk    Difficulty of Paying Living Expenses: Not hard at all  Food Insecurity: Not on file  Transportation Needs: Not on file  Physical Activity: Not on file  Stress: Stress Concern Present   Feeling of Stress : To some extent  Social Connections: Not on file     Review of Systems  Constitutional:  Negative for appetite change and unexpected weight change.  HENT:  Positive for sneezing.        Sneezing.  Runny nose.    Respiratory:  Negative for cough, chest tightness and shortness of breath.   Cardiovascular:  Negative for chest pain, palpitations and leg swelling.  Gastrointestinal:  Negative for abdominal pain, diarrhea, nausea and vomiting.  Genitourinary:  Positive for frequency. Negative for difficulty urinating and dysuria.  Musculoskeletal:  Negative for joint swelling and myalgias.  Skin:  Negative for color change and rash.  Neurological:  Positive for tremors. Negative for dizziness, light-headedness and headaches.  Psychiatric/Behavioral:  Negative for agitation and dysphoric mood.       Objective:     BP 112/76    Pulse 71    Temp 98.1 F (36.7 C)    Resp 16    Ht 5' 5"  (1.651 m)    Wt 177 lb (80.3 kg)    SpO2 98%    BMI 29.45 kg/m  Wt Readings from Last 3 Encounters:  09/21/21 177 lb (80.3 kg)  08/02/21 175 lb 4 oz (79.5 kg)  06/21/21 177 lb (80.3 kg)    Physical Exam Vitals reviewed.  Constitutional:      General: She is not in acute distress.    Appearance: Normal appearance.  HENT:     Head: Normocephalic and atraumatic.     Right Ear: External ear normal.     Left Ear: External ear normal.  Eyes:     General: No scleral icterus.       Right eye: No discharge.        Left eye: No discharge.     Conjunctiva/sclera: Conjunctivae normal.  Neck:     Thyroid: No thyromegaly.  Cardiovascular:     Rate and  Rhythm: Normal rate and regular rhythm.  Pulmonary:     Effort: No respiratory distress.     Breath sounds: Normal breath sounds. No  wheezing.  Abdominal:     General: Bowel sounds are normal.     Palpations: Abdomen is soft.     Tenderness: There is no abdominal tenderness.  Musculoskeletal:        General: No swelling or tenderness.     Cervical back: Neck supple. No tenderness.  Lymphadenopathy:     Cervical: No cervical adenopathy.  Skin:    Findings: No erythema or rash.  Neurological:     Mental Status: She is alert.  Psychiatric:        Mood and Affect: Mood normal.        Behavior: Behavior normal.     Outpatient Encounter Medications as of 09/21/2021  Medication Sig   azelastine (ASTELIN) 0.1 % nasal spray Place 1 spray into both nostrils 2 (two) times daily. Use in each nostril as directed   acetaminophen (TYLENOL) 650 MG CR tablet Take 650 mg by mouth every 8 (eight) hours as needed for pain.   cholecalciferol (VITAMIN D3) 25 MCG (1000 UNIT) tablet Take 1,000 Units by mouth daily.   DULoxetine (CYMBALTA) 60 MG capsule TAKE 1 CAPSULE BY MOUTH AT BEDTIME.   ELIQUIS 5 MG TABS tablet TAKE 1 TABLET BY MOUTH TWICE A DAY   furosemide (LASIX) 20 MG tablet TAKE 1 TABLET BY MOUTH DAILY AS NEEDED. TAKE WITH POTASSIUM.   gabapentin (NEURONTIN) 300 MG capsule TAKE 2 CAPSULES BY MOUTH AT BEDTIME   levothyroxine (SYNTHROID) 100 MCG tablet TAKE 1 TABLET EVERY DAY ON EMPTY STOMACHWITH A GLASS OF WATER AT LEAST 30-60 MINBEFORE BREAKFAST   lovastatin (MEVACOR) 40 MG tablet TAKE 1 TABLET BY MOUTH DAILY   metoprolol tartrate 75 MG TABS Take 75 mg by mouth 2 (two) times daily.   Multiple Vitamins-Minerals (PRESERVISION AREDS 2+MULTI VIT PO) Take by mouth.   omeprazole (PRILOSEC) 20 MG capsule TAKE 1 CAPSULE BY MOUTH ONCE DAILY   Polyethyl Glycol-Propyl Glycol (SYSTANE OP) Place 1 drop into both eyes daily as needed (for dry eyes).   polyethylene glycol powder (GLYCOLAX/MIRALAX) 17 GM/SCOOP  powder Take 17 g by mouth daily.   potassium chloride (KLOR-CON) 10 MEQ tablet TAKE 2 TABLETS BY MOUTH DAILY AS NEEDED.WITH FUROSEMIDE.   White Petrolatum-Mineral Oil (GENTEAL TEARS NIGHT-TIME OP) Place 1 application into both eyes at bedtime. Night time ointment 3.5g   [DISCONTINUED] albuterol (VENTOLIN HFA) 108 (90 Base) MCG/ACT inhaler Inhale 1-2 puffs into the lungs every 6 (six) hours as needed for wheezing or shortness of breath. (Patient not taking: Reported on 09/01/2021)   No facility-administered encounter medications on file as of 09/21/2021.     Lab Results  Component Value Date   WBC 8.6 04/27/2021   HGB 13.3 04/27/2021   HCT 41.2 04/27/2021   PLT 262.0 04/27/2021   GLUCOSE 112 (H) 06/21/2021   CHOL 144 06/21/2021   TRIG 171.0 (H) 06/21/2021   HDL 33.30 (L) 06/21/2021   LDLDIRECT 90.0 03/15/2021   LDLCALC 77 06/21/2021   ALT 25 06/21/2021   AST 22 06/21/2021   NA 140 06/21/2021   K 4.4 06/21/2021   CL 104 06/21/2021   CREATININE 0.85 06/21/2021   BUN 12 06/21/2021   CO2 30 06/21/2021   TSH 1.39 05/12/2021   INR 1.5 (H) 11/21/2018   HGBA1C 6.3 06/21/2021       Assessment & Plan:   Problem List Items Addressed This Visit     Anemia    Follow cbc.       Anxiety    Continue cymbalta.  Overall appears to be doing better.  Follow.       Aortic atherosclerosis (HCC)    On lovastatin.       Atrial fibrillation [facility])    S/p pacemaker placement.  On eliquis and metoprolol.  Overall appears to be stable.      Bronchiectasis without complication (HCC)    Breathing stable.        Elevated alkaline phosphatase level    Last check - alk phos - 124.  Follow. GGT 05/12/21 - wnl (10).       Essential hypertension - Primary    On metoprolol and lasix.  Blood pressure as outlined.  Appears to be doing better.  Continue current medication regimen.  Follow pressures.  Follow metabolic panel.       Relevant Orders   Basic metabolic panel   GERD  (gastroesophageal reflux disease)    Upper symptoms appear to be controlled on omeprazole.       History of breast cancer    Mammogram 12/21/20 - Bir[facility] I.  Had CT scan as outlined.  Changes as outlined:  enlarging partially calcified soft tissue mass in the left breast.  Per report - has enlarged over the past 2 years and is now more infiltrative in appearance in direct contact with the underlying left pectoralis major musculature. Findings  - highly suspicious for locally recurrent breast cancer.  Just evaluated by Dr Bary Castilla.  S/p biopsy - fat necrosis.  Recommended f/u in 12/2021.         History of thyroid cancer    On thyroid replacement.  Follow tsh.       Hypercholesterolemia    On lovastatin.  Continue diet and exercise.  Follow fasting profile and liver panel.       Relevant Orders   Lipid panel   Hepatic function panel   Hyperglycemia    Low carb diet and exercise.  Follow met b and a1c.        Relevant Orders   Hemoglobin A1c   Hypothyroid    On thyroid replacement.  Follow tsh.        Mucopurulent chronic bronchitis (HCC)    Sees pulmonary.  Breathing stable.  Treat allergies.        Obstructive sleep apnea    CPAP.       Right knee pain    Previously saw ortho.  Continued issues.  Wants to hold on any further intervention at this time.        Runny nose    Sneezing and runny nose as outlined.  nasacort nasal spray and astelin as directed.  Follow.        Tachy-brady syndrome (HCC)    Seeing Dr Rockey Situ and EP.  On metoprolol.  Recently increased to 29m bid.  Follow.       Tremor    Reports noticing tremor.  Request neurology referral.        Relevant Orders   Ambulatory referral to Neurology     CEinar Pheasant MD

## 2021-09-21 NOTE — Patient Instructions (Signed)
Nasacort nasal spray - 2 sprays each nostril one time per day.  Do this in the evening.   Astelin nasal spray - one spray each nostril two times per day.

## 2021-09-28 ENCOUNTER — Encounter: Payer: Self-pay | Admitting: Internal Medicine

## 2021-09-28 DIAGNOSIS — R0989 Other specified symptoms and signs involving the circulatory and respiratory systems: Secondary | ICD-10-CM | POA: Insufficient documentation

## 2021-09-28 DIAGNOSIS — R251 Tremor, unspecified: Secondary | ICD-10-CM | POA: Insufficient documentation

## 2021-09-28 NOTE — Assessment & Plan Note (Signed)
Previously saw ortho.  Continued issues.  Wants to hold on any further intervention at this time.

## 2021-09-28 NOTE — Assessment & Plan Note (Signed)
Breathing stable.

## 2021-09-28 NOTE — Assessment & Plan Note (Signed)
Follow cbc.  

## 2021-09-28 NOTE — Assessment & Plan Note (Signed)
Sees pulmonary.  Breathing stable.  Treat allergies.

## 2021-09-28 NOTE — Assessment & Plan Note (Signed)
On thyroid replacement.  Follow tsh.  

## 2021-09-28 NOTE — Assessment & Plan Note (Signed)
Sneezing and runny nose as outlined.  nasacort nasal spray and astelin as directed.  Follow.

## 2021-09-28 NOTE — Assessment & Plan Note (Signed)
Mammogram 12/21/20 - Birads I.  Had CT scan as outlined.  Changes as outlined:  enlarging partially calcified soft tissue mass in the left breast.  Per report - has enlarged over the past 2 years and is now more infiltrative in appearance in direct contact with the underlying left pectoralis major musculature. Findings  - highly suspicious for locally recurrent breast cancer.  Just evaluated by Dr Bary Castilla.  S/p biopsy - fat necrosis.  Recommended f/u in 12/2021.

## 2021-09-28 NOTE — Assessment & Plan Note (Signed)
On lovastatin 

## 2021-09-28 NOTE — Assessment & Plan Note (Signed)
On lovastatin.  Continue diet and exercise.  Follow fasting profile and liver panel.  

## 2021-09-28 NOTE — Assessment & Plan Note (Signed)
Continue cymbalta.  Overall appears to be doing better.  Follow.

## 2021-09-28 NOTE — Assessment & Plan Note (Signed)
Last check - alk phos - 124.  Follow. GGT 05/12/21 - wnl (10).

## 2021-09-28 NOTE — Assessment & Plan Note (Signed)
On metoprolol and lasix.  Blood pressure as outlined.  Appears to be doing better.  Continue current medication regimen.  Follow pressures.  Follow metabolic panel.

## 2021-09-28 NOTE — Assessment & Plan Note (Signed)
Seeing Dr Rockey Situ and EP.  On metoprolol.  Recently increased to 75mg  bid.  Follow.

## 2021-09-28 NOTE — Assessment & Plan Note (Signed)
Upper symptoms appear to be controlled on omeprazole.  

## 2021-09-28 NOTE — Assessment & Plan Note (Signed)
CPAP.  

## 2021-09-28 NOTE — Assessment & Plan Note (Signed)
Low carb diet and exercise.  Follow met b and a1c.

## 2021-09-28 NOTE — Assessment & Plan Note (Signed)
Reports noticing tremor.  Request neurology referral.

## 2021-09-28 NOTE — Assessment & Plan Note (Signed)
S/p pacemaker placement.  On eliquis and metoprolol.  Overall appears to be stable. 

## 2021-10-06 DIAGNOSIS — L03213 Periorbital cellulitis: Secondary | ICD-10-CM | POA: Diagnosis not present

## 2021-10-15 DIAGNOSIS — H353221 Exudative age-related macular degeneration, left eye, with active choroidal neovascularization: Secondary | ICD-10-CM | POA: Diagnosis not present

## 2021-10-19 ENCOUNTER — Ambulatory Visit (INDEPENDENT_AMBULATORY_CARE_PROVIDER_SITE_OTHER): Payer: PPO

## 2021-10-19 DIAGNOSIS — I495 Sick sinus syndrome: Secondary | ICD-10-CM | POA: Diagnosis not present

## 2021-10-19 LAB — CUP PACEART REMOTE DEVICE CHECK
Battery Remaining Longevity: 91 mo
Battery Remaining Percentage: 85 %
Battery Voltage: 3.01 V
Brady Statistic AP VP Percent: 3.7 %
Brady Statistic AP VS Percent: 84 %
Brady Statistic AS VP Percent: 1 %
Brady Statistic AS VS Percent: 8.4 %
Brady Statistic RA Percent Paced: 81 %
Brady Statistic RV Percent Paced: 3.8 %
Date Time Interrogation Session: 20230124040015
Implantable Lead Implant Date: 20210416
Implantable Lead Implant Date: 20210416
Implantable Lead Location: 753859
Implantable Lead Location: 753860
Implantable Pulse Generator Implant Date: 20210416
Lead Channel Impedance Value: 360 Ohm
Lead Channel Impedance Value: 490 Ohm
Lead Channel Pacing Threshold Amplitude: 0.75 V
Lead Channel Pacing Threshold Amplitude: 1 V
Lead Channel Pacing Threshold Pulse Width: 0.5 ms
Lead Channel Pacing Threshold Pulse Width: 0.5 ms
Lead Channel Sensing Intrinsic Amplitude: 1.3 mV
Lead Channel Sensing Intrinsic Amplitude: 12 mV
Lead Channel Setting Pacing Amplitude: 2 V
Lead Channel Setting Pacing Amplitude: 2.5 V
Lead Channel Setting Pacing Pulse Width: 0.5 ms
Lead Channel Setting Sensing Sensitivity: 2 mV
Pulse Gen Model: 2272
Pulse Gen Serial Number: 3813093

## 2021-10-25 ENCOUNTER — Telehealth: Payer: Self-pay | Admitting: Internal Medicine

## 2021-10-25 DIAGNOSIS — R300 Dysuria: Secondary | ICD-10-CM | POA: Diagnosis not present

## 2021-10-25 DIAGNOSIS — R079 Chest pain, unspecified: Secondary | ICD-10-CM | POA: Diagnosis not present

## 2021-10-25 DIAGNOSIS — R0789 Other chest pain: Secondary | ICD-10-CM | POA: Diagnosis not present

## 2021-10-25 NOTE — Telephone Encounter (Signed)
Pt called in stating that she thinks she has a UTI. Pt stated when she urinates it hurts across her chest and it goes down to the tips of her fingers almost like having a chill but its painful. Pt stated that her urine is dark and it spells. Pt stated when she stops urinating that's when the pain stops. Sent to access nurse

## 2021-10-25 NOTE — Telephone Encounter (Signed)
Access Nurse called and Patient has refused to go to the hospital. She states she does not have a ride to hospital. Message sent to K. Davis.

## 2021-10-25 NOTE — Telephone Encounter (Signed)
Patient say she has experienced this feeling before her legs ache and her headaches, she says when she went to restroom and she had chest pain from the center to her finger tips and the second time she went to the rest room same thing happen she started to have severe chest pain that radiated from center of chest to her finger tips rated at a 10 on pain scale. Patient also says he has a severe headache and she feels tired, advised needs to go ED now we cannot rule out this being her heart, patient says her urine was dark and had a smell advised I had ever heard of UTI causing severe chest pain that is why she needs to be evaluated in the ED . Patient finally agreed to go stating she would have her daughter drive her.

## 2021-10-25 NOTE — Telephone Encounter (Signed)
FYI- Patient going to ED

## 2021-10-25 NOTE — Telephone Encounter (Signed)
Patient called stating that access nurse advise her to go to ed and patient  decline to go to ED,

## 2021-10-26 ENCOUNTER — Ambulatory Visit (INDEPENDENT_AMBULATORY_CARE_PROVIDER_SITE_OTHER): Payer: PPO | Admitting: Family

## 2021-10-26 ENCOUNTER — Encounter: Payer: Self-pay | Admitting: Family

## 2021-10-26 ENCOUNTER — Ambulatory Visit: Payer: BC Managed Care – PPO | Admitting: Family

## 2021-10-26 ENCOUNTER — Other Ambulatory Visit: Payer: Self-pay

## 2021-10-26 VITALS — BP 152/78 | HR 51 | Temp 98.0°F | Ht 65.0 in | Wt 175.4 lb

## 2021-10-26 DIAGNOSIS — A499 Bacterial infection, unspecified: Secondary | ICD-10-CM | POA: Diagnosis not present

## 2021-10-26 DIAGNOSIS — R300 Dysuria: Secondary | ICD-10-CM | POA: Diagnosis not present

## 2021-10-26 DIAGNOSIS — N390 Urinary tract infection, site not specified: Secondary | ICD-10-CM

## 2021-10-26 LAB — POCT URINALYSIS DIPSTICK
Glucose, UA: POSITIVE — AB
Ketones, UA: 15
Nitrite, UA: POSITIVE
Protein, UA: POSITIVE — AB
Spec Grav, UA: <=1.005 — AB (ref 1.010–1.025)
Urobilinogen, UA: >=8 E.U./dL — AB
pH, UA: 5 (ref 5.0–8.0)

## 2021-10-26 MED ORDER — NITROFURANTOIN MONOHYD MACRO 100 MG PO CAPS: 100.0000 mg | ORAL_CAPSULE | Freq: Two times a day (BID) | ORAL | 0 refills | Status: DC

## 2021-10-26 NOTE — Telephone Encounter (Signed)
Noted  

## 2021-10-26 NOTE — Telephone Encounter (Signed)
Patient is scheduled for OV with P.Webb at 2pm. Patient states she met with EMT yesterday and was advised it was not her heart so patient decided to not go to the ED. Patient said the pain she was feeling before has subside however she is experiencing pain when she urinates. Patient was advised if problems get worse before she can be see to go t the ED or UC. Patient gave verbal understanding.

## 2021-10-26 NOTE — Telephone Encounter (Signed)
Apparently she did not go to ER.  Need to confirm she is doing ok.  Needs to be evaluated.

## 2021-10-28 ENCOUNTER — Other Ambulatory Visit: Payer: Self-pay

## 2021-10-28 ENCOUNTER — Other Ambulatory Visit: Payer: Self-pay | Admitting: Internal Medicine

## 2021-10-28 MED ORDER — OMEPRAZOLE 20 MG PO CPDR: 20.0000 mg | DELAYED_RELEASE_CAPSULE | Freq: Every day | ORAL | 3 refills | Status: DC

## 2021-10-28 NOTE — Progress Notes (Signed)
Acute Office Visit  Subjective:    Patient ID: Kerri Mills, female    DOB: 12-17-1930, 88 y.o.   MRN: [id number]  Chief Complaint  Patient presents with   Urinary Tract Infection    HPI Patient is in today with c/o burning with urination, frequency, urgency x 3 days. She reports the pain starts in her pelvic area, up to her chest and into her fingertips bilaterally. She has never had a UTI in the past. She has increased her intake of water, cranberry juice.   Past Medical History:  Diagnosis Date   Allergy    Anxiety    Arthritis    Atypical chest pain    a. 10/2018 MV: small, fixed apical defect possibly 2/2 attenuation artifact. No ischemia.  EF 68%.   Clotting disorder ([place])    Colitis    Depression    Diverticulitis 2013   Gastric ulcer    GERD (gastroesophageal reflux disease)    History of echocardiogram    a. 09/2019 Echo: EF 55-60%, mod LVH. Mildly dil LA. Triv MR/TR.   Hypercholesterolemia    Hypertension    Infiltrating lobular carcinoma of left breast 2011   T2,N0, ER: 90%; PR 0%; Her 2 neu not amplified. [facility]).   Melanoma ([place]) 1997   Melanoma in situ of upper extremity ([place]) 03/19/2011   Persistent atrial fibrillation ([place])    a. CHADS2VASc => 4 (HTN, age x 2, female)   Personal history of radiation therapy 2011   BREAST CA   Seroma    HISTORY OF LFT BREAST   Sleep apnea    Thyroid cancer ([place]) 1992    Past Surgical History:  Procedure Laterality Date   ABDOMINAL HYSTERECTOMY  1973   partial   BREAST BIOPSY Left 02-13-13   BENIGN BREAST TISSUE WITH CHANGES CONSISTENT WITH FAT NECROSIS   BREAST BIOPSY Left 01/21/2015   bx done in [facility] 11:00 left 6-8cmfn   BREAST EXCISIONAL BIOPSY Left 1995   neg   BREAST EXCISIONAL BIOPSY Left 2011   Breast cancer radiation   BREAST LUMPECTOMY Left 2011   BREAST CA   CARDIAC CATHETERIZATION     CHOLECYSTECTOMY     COLONOSCOPY  2013   COLONOSCOPY WITH  PROPOFOL N/A 06/09/2021   Procedure: COLONOSCOPY WITH PROPOFOL;  Surgeon: Jonathon Bellows, MD;  Location[facility] END[facility]COPY;  Service: Gastroenterology;  Laterality: N/A;   MELANOMA EXCISION     RT UPPER ARM   PACEMAKER IMPLANT N/A 01/10/2020   Procedure: PACEMAKER IMPLANT;  Surgeon: Constance Haw, MD;  Location: [place] CV LAB;  Service: Cardiovascular;  Laterality: N/A;   PARTIAL HYSTERECTOMY     bleeding, ovaries in place.     THYROID SURGERY  1992   FOR THYROID CANCER   TONSILLECTOMY      Family History  Problem Relation Age of Onset   Heart disease Mother    Cancer Brother        lung    Cancer Sister        breast   Breast cancer Neg Hx     Social History   Socioeconomic History   Marital status: Widowed    Spouse name: Not on file   Number of children: Not on file   Years of education: Not on file   Highest education level: Not on file  Occupational History   Not on file  Tobacco Use   Smoking status: Never   Smokeless tobacco: Never  Tobacco comments:    never  Substance and Sexual Activity   Alcohol use: Yes    Comment: once in a while   Drug use: No   Sexual activity: Never  Other Topics Concern   Not on file  Social History Narrative   Independent and baseline. Lives by herself   Social Determinants of Health   Financial Resource Strain: Low Risk    Difficulty of Paying Living Expenses: Not hard at all  Food Insecurity: Not on file  Transportation Needs: Not on file  Physical Activity: Not on file  Stress: Stress Concern Present   Feeling of Stress : To some extent  Social Connections: Not on file  Intimate Partner Violence: Not on file    Outpatient Medications Prior to Visit  Medication Sig Dispense Refill   acetaminophen (TYLENOL) 650 MG CR tablet Take 650 mg by mouth every 8 (eight) hours as needed for pain.     azelastine (ASTELIN) 0.1 % nasal spray Place 1 spray into both nostrils 2 (two) times daily. Use in  each nostril as directed 30 mL 0   cholecalciferol (VITAMIN D3) 25 MCG (1000 UNIT) tablet Take 1,000 Units by mouth daily.     DULoxetine (CYMBALTA) 60 MG capsule TAKE 1 CAPSULE BY MOUTH AT BEDTIME. 90 capsule 1   ELIQUIS 5 MG TABS tablet TAKE 1 TABLET BY MOUTH TWICE A DAY 180 tablet 1   furosemide (LASIX) 20 MG tablet TAKE 1 TABLET BY MOUTH DAILY AS NEEDED. TAKE WITH POTASSIUM. 90 tablet 3   gabapentin (NEURONTIN) 300 MG capsule TAKE 2 CAPSULES BY MOUTH AT BEDTIME 180 capsule 1   levothyroxine (SYNTHROID) 100 MCG tablet TAKE 1 TABLET EVERY DAY ON EMPTY STOMACHWITH A GLASS OF WATER AT LEAST 30-60 MINBEFORE BREAKFAST 90 tablet 1   lovastatin (MEVACOR) 40 MG tablet TAKE 1 TABLET BY MOUTH DAILY 90 tablet 3   metoprolol tartrate 75 MG TABS Take 75 mg by mouth 2 (two) times daily. 180 tablet 3   Multiple Vitamins-Minerals (PRESERVISION AREDS 2+MULTI VIT PO) Take by mouth.     omeprazole (PRILOSEC) 20 MG capsule TAKE 1 CAPSULE BY MOUTH ONCE DAILY 90 capsule 3   Polyethyl Glycol-Propyl Glycol (SYSTANE OP) Place 1 drop into both eyes daily as needed (for dry eyes).     polyethylene glycol powder (GLYCOLAX/MIRALAX) 17 GM/SCOOP powder Take 17 g by mouth daily. 850 g 1   potassium chloride (KLOR-CON) 10 MEQ tablet TAKE 2 TABLETS BY MOUTH DAILY AS NEEDED.WITH FUROSEMIDE. 60 tablet 1   White Petrolatum-Mineral Oil (GENTEAL TEARS NIGHT-TIME OP) Place 1 application into both eyes at bedtime. Night time ointment 3.5g     No facility-administered medications prior to visit.    Allergies  Allergen Reactions   Atorvastatin Other (See Comments)    Other reaction(s): Other (See Comments) STIFFNESS AND SORE STIFFNESS AND SORE   Lipitor [Atorvastatin Calcium] Other (See Comments)    Stiffness & soreness   Penicillins Rash    REACTION: Unknown reaction    Review of Systems  Constitutional: Negative.   Respiratory: Negative.    Cardiovascular: Negative.   Gastrointestinal: Negative.    Genitourinary:  Positive for dysuria, frequency, hematuria and urgency.  Musculoskeletal: Negative.   Skin: Negative.   Allergic/Immunologic: Negative.   Neurological: Negative.   Psychiatric/Behavioral: Negative.    All other systems reviewed and are negative.     Objective:    Physical Exam Vitals and nursing note reviewed.  Constitutional:      Appearance:  Normal appearance.  Cardiovascular:     Rate and Rhythm: Normal rate and regular rhythm.  Pulmonary:     Effort: Pulmonary effort is normal.     Breath sounds: Normal breath sounds.  Abdominal:     General: Abdomen is flat.     Palpations: Abdomen is soft.  Musculoskeletal:        General: Normal range of motion.  Skin:    General: Skin is warm and dry.  Neurological:     General: No focal deficit present.     Mental Status: She is alert and oriented to person, place, and time.  Psychiatric:        Mood and Affect: Mood normal.        Behavior: Behavior normal.   BP (!) 152/78    Pulse (!) 51    Temp 98 F (36.7 C) (Oral)    Ht 5\' 5"  (1.651 m)    Wt 175 lb 6.4 oz (79.6 kg)    SpO2 94%    BMI 29.19 kg/m  Wt Readings from Last 3 Encounters:  10/26/21 175 lb 6.4 oz (79.6 kg)  09/21/21 177 lb (80.3 kg)  08/02/21 175 lb 4 oz (79.5 kg)    There are no preventive care reminders to display for this patient.  There are no preventive care reminders to display for this patient.   Lab Results  Component Value Date   TSH 1.39 05/12/2021   Lab Results  Component Value Date   WBC 8.6 04/27/2021   HGB 13.3 04/27/2021   HCT 41.2 04/27/2021   MCV 92.9 04/27/2021   PLT 262.0 04/27/2021   Lab Results  Component Value Date   NA 140 06/21/2021   K 4.4 06/21/2021   CO2 30 06/21/2021   GLUCOSE 112 (H) 06/21/2021   BUN 12 06/21/2021   CREATININE 0.85 06/21/2021   BILITOT 0.5 06/21/2021   ALKPHOS 124 (H) 06/21/2021   AST 22 06/21/2021   ALT 25 06/21/2021   PROT 6.3 06/21/2021   ALBUMIN 3.9 06/21/2021   CALCIUM  9.7 06/21/2021   ANIONGAP 11 01/10/2020   GFR 61.36 06/21/2021   Lab Results  Component Value Date   CHOL 144 06/21/2021   Lab Results  Component Value Date   HDL 33.30 (L) 06/21/2021   Lab Results  Component Value Date   LDLCALC 77 06/21/2021   Lab Results  Component Value Date   TRIG 171.0 (H) 06/21/2021   Lab Results  Component Value Date   CHOLHDL 4 06/21/2021   Lab Results  Component Value Date   HGBA1C 6.3 06/21/2021       Assessment & Plan:   Problem List Items Addressed This Visit   None Visit Diagnoses     Dysuria    -  Primary   Relevant Orders   Urine Culture (Completed)   POC Urinalysis Dipstick (Completed)   Bacterial urinary tract infection       Relevant Medications   nitrofurantoin, macrocrystal-monohydrate, (MACROBID) 100 MG capsule        Meds ordered this encounter  Medications   nitrofurantoin, macrocrystal-monohydrate, (MACROBID) 100 MG capsule    Sig: Take 1 capsule (100 mg total) by mouth 2 (two) times daily.    Dispense:  10 capsule    Refill:  0   Call the office if symptoms worsen or persist. Recheck as scheduled and sooner as needed.   Kennyth Arnold, FNP

## 2021-10-29 DIAGNOSIS — J410 Simple chronic bronchitis: Secondary | ICD-10-CM | POA: Diagnosis not present

## 2021-10-29 DIAGNOSIS — J479 Bronchiectasis, uncomplicated: Secondary | ICD-10-CM | POA: Diagnosis not present

## 2021-10-29 DIAGNOSIS — G4733 Obstructive sleep apnea (adult) (pediatric): Secondary | ICD-10-CM | POA: Diagnosis not present

## 2021-10-29 LAB — URINE CULTURE
MICRO NUMBER:: 12943384
SPECIMEN QUALITY:: ADEQUATE

## 2021-10-29 NOTE — Progress Notes (Signed)
Remote pacemaker transmission.   

## 2021-11-02 ENCOUNTER — Ambulatory Visit (INDEPENDENT_AMBULATORY_CARE_PROVIDER_SITE_OTHER): Payer: PPO

## 2021-11-02 ENCOUNTER — Other Ambulatory Visit: Payer: Self-pay

## 2021-11-02 ENCOUNTER — Ambulatory Visit: Payer: PPO | Admitting: Podiatry

## 2021-11-02 DIAGNOSIS — M79675 Pain in left toe(s): Secondary | ICD-10-CM

## 2021-11-02 DIAGNOSIS — M778 Other enthesopathies, not elsewhere classified: Secondary | ICD-10-CM | POA: Diagnosis not present

## 2021-11-02 DIAGNOSIS — M79674 Pain in right toe(s): Secondary | ICD-10-CM

## 2021-11-02 DIAGNOSIS — M2042 Other hammer toe(s) (acquired), left foot: Secondary | ICD-10-CM

## 2021-11-02 DIAGNOSIS — B351 Tinea unguium: Secondary | ICD-10-CM | POA: Diagnosis not present

## 2021-11-02 DIAGNOSIS — L989 Disorder of the skin and subcutaneous tissue, unspecified: Secondary | ICD-10-CM

## 2021-11-02 NOTE — Progress Notes (Signed)
HPI: 88 y.o. female presenting today for follow-up evaluation of symptomatic calluses to the bilateral feet as well as routine foot care and to have her nails trimmed.  She states that her feet have become very painful.  Last visit on 12/06/2019 we were going to proceed with surgical correction of the hammertoe deformity to the left foot.  She would like to discuss that again today.  Since last visit she did suffer MI and is on anticoagulant medication.  She presents for further treatment and evaluation   Past Medical History:  Diagnosis Date   Allergy    Anxiety    Arthritis    Atypical chest pain    a. 10/2018 MV: small, fixed apical defect possibly 2/2 attenuation artifact. No ischemia.  EF 68%.   Clotting disorder ([place])    Colitis    Depression    Diverticulitis 2013   Gastric ulcer    GERD (gastroesophageal reflux disease)    History of echocardiogram    a. 09/2019 Echo: EF 55-60%, mod LVH. Mildly dil LA. Triv MR/TR.   Hypercholesterolemia    Hypertension    Infiltrating lobular carcinoma of left breast 2011   T2,N0, ER: 90%; PR 0%; Her 2 neu not amplified. [facility]).   Melanoma ([place]) 1997   Melanoma in situ of upper extremity ([place]) 03/19/2011   Persistent atrial fibrillation ([place])    a. CHADS2VASc => 4 (HTN, age x 2, female)   Personal history of radiation therapy 2011   BREAST CA   Seroma    HISTORY OF LFT BREAST   Sleep apnea    Thyroid cancer ([place]) 1992   Past Surgical History:  Procedure Laterality Date   ABDOMINAL HYSTERECTOMY  1973   partial   BREAST BIOPSY Left 02-13-13   BENIGN BREAST TISSUE WITH CHANGES CONSISTENT WITH FAT NECROSIS   BREAST BIOPSY Left 01/21/2015   bx done in [facility] 11:00 left 6-8cmfn   BREAST EXCISIONAL BIOPSY Left 1995   neg   BREAST EXCISIONAL BIOPSY Left 2011   Breast cancer radiation   BREAST LUMPECTOMY Left 2011   BREAST CA   CARDIAC CATHETERIZATION     CHOLECYSTECTOMY     COLONOSCOPY  2013   COLONOSCOPY WITH  PROPOFOL N/A 06/09/2021   Procedure: COLONOSCOPY WITH PROPOFOL;  Surgeon: Jonathon Bellows, MD;  Location: [facility] ENDOSCOPY;  Service: Gastroenterology;  Laterality: N/A;   MELANOMA EXCISION     RT UPPER ARM   PACEMAKER IMPLANT N/A 01/10/2020   Procedure: PACEMAKER IMPLANT;  Surgeon: Constance Haw, MD;  Location: [place] CV LAB;  Service: Cardiovascular;  Laterality: N/A;   PARTIAL HYSTERECTOMY     bleeding, ovaries in place.     THYROID SURGERY  1992   FOR THYROID CANCER   TONSILLECTOMY     Allergies  Allergen Reactions   Atorvastatin Other (See Comments)    Other reaction(s): Other (See Comments) STIFFNESS AND SORE STIFFNESS AND SORE   Lipitor [Atorvastatin Calcium] Other (See Comments)    Stiffness & soreness   Penicillins Rash    REACTION: Unknown reaction     Objective: Physical Exam General: The patient is alert and oriented x3 in no acute distress.  Dermatology: Skin is cool, dry and supple bilateral lower extremities. Negative for open lesions or macerations.  Hyperkeratotic preulcerative callus tissues noted to the bilateral feet with associated tenderness to palpation.  Hyperkeratotic elongated nails also noted 1-5 bilateral  Vascular: Palpable pedal pulses bilaterally. No edema or erythema noted.  Capillary refill within normal limits.  Neurological: Epicritic and protective threshold grossly intact bilaterally.   Musculoskeletal Exam: All pedal and ankle joints range of motion within normal limits bilateral. Muscle strength 5/5 in all groups bilateral. Hammertoe contracture deformity noted to the 2nd digit of the left foot overlapping the great toe   Assessment: 1. Pre-ulcerative callus lesions noted to the bilateral feet x 2 2. Hammertoe contracture left 2nd digit  3.  Pain due to onychomycosis of toenails both   Plan of Care:  1. Patient evaluated.  2. Excisional debridement of keratotic lesion(s) using a chisel blade was performed without incident. Light  dressing applied.  3.  Mechanical debridement of nails 1-5 bilateral was performed using a nail nipper without incident or bleeding  4.  Today again we discussed the conservative versus surgical management of the hammertoe to deformity to the left second digit.  The patient states that she would like to have surgery to correct for the hammertoe.  Since last visit however she did have some atypical chest pain and she was placed on anticoagulant.  We are not going to pursue surgery at the moment and will continue conservative treatment including good supportive shoes and sneakers 5.  Return to clinic 3 months for routine foot care and possibly redressed surgery    Edrick Kins, DPM [facility]  Dr. Edrick Kins, DPM    [address], [place] [address]                Office [phone]  Fax [phone]

## 2021-11-05 MED ORDER — ALIGN 4 MG PO CAPS: ORAL_CAPSULE | ORAL | 0 refills | Status: DC

## 2021-11-05 MED ORDER — CIPROFLOXACIN HCL 250 MG PO TABS: 250.0000 mg | ORAL_TABLET | Freq: Two times a day (BID) | ORAL | 0 refills | Status: AC

## 2021-11-05 NOTE — Telephone Encounter (Signed)
Called and spoke to Kerri Mills.  She was recently seen and diagnosed with UTI.  Was treated with macrobid.  The symptoms never improved but did not resolve.  She is having increased urination, dysuria,etc - same symptoms as previous.  Discussed treatment options.  Unable to get urine sample now.  Culture results reviewed.  Allergy to pcn.  Sensitive to cipro.  Will treat with cipro, given sensitivities and given persistent symptoms despite macrobid.  Discussed possible side effects and risk of cipro.  Pt in agreement to take cipro.  Probiotic as directed.

## 2021-11-05 NOTE — Telephone Encounter (Signed)
Pt stating she is still hurting from UTI and pt states medication did not get rid of it after a day of taking it started having same symptoms again. Pt thinks she may need another antibiotic.

## 2021-11-05 NOTE — Telephone Encounter (Signed)
Spoke with pt and she stated that she completed her antibiotics and within 24 hours the symptoms came back. Pressure, burning, sensation that runs through her body during urination. Pt was transferred to Dr. Nicki Reaper per her request.

## 2021-11-09 DIAGNOSIS — E669 Obesity, unspecified: Secondary | ICD-10-CM | POA: Diagnosis not present

## 2021-11-09 DIAGNOSIS — D6869 Other thrombophilia: Secondary | ICD-10-CM | POA: Diagnosis not present

## 2021-11-09 DIAGNOSIS — E039 Hypothyroidism, unspecified: Secondary | ICD-10-CM | POA: Diagnosis not present

## 2021-11-09 DIAGNOSIS — K219 Gastro-esophageal reflux disease without esophagitis: Secondary | ICD-10-CM | POA: Diagnosis not present

## 2021-11-09 DIAGNOSIS — F3342 Major depressive disorder, recurrent, in full remission: Secondary | ICD-10-CM | POA: Diagnosis not present

## 2021-11-09 DIAGNOSIS — G250 Essential tremor: Secondary | ICD-10-CM | POA: Diagnosis not present

## 2021-11-09 DIAGNOSIS — I10 Essential (primary) hypertension: Secondary | ICD-10-CM | POA: Diagnosis not present

## 2021-11-09 DIAGNOSIS — E785 Hyperlipidemia, unspecified: Secondary | ICD-10-CM | POA: Diagnosis not present

## 2021-11-09 DIAGNOSIS — M1990 Unspecified osteoarthritis, unspecified site: Secondary | ICD-10-CM | POA: Diagnosis not present

## 2021-11-09 DIAGNOSIS — I495 Sick sinus syndrome: Secondary | ICD-10-CM | POA: Diagnosis not present

## 2021-11-09 DIAGNOSIS — I4891 Unspecified atrial fibrillation: Secondary | ICD-10-CM | POA: Diagnosis not present

## 2021-11-09 DIAGNOSIS — H353221 Exudative age-related macular degeneration, left eye, with active choroidal neovascularization: Secondary | ICD-10-CM | POA: Diagnosis not present

## 2021-11-11 ENCOUNTER — Other Ambulatory Visit: Payer: Self-pay | Admitting: Cardiology

## 2021-11-11 NOTE — Telephone Encounter (Signed)
Pt last saw Dr Rockey Situ 08/02/21, last labs 06/21/21 Creat 0.85, age 88, weight 79.6kg, based on specified criteria pt is on appropriate dosage of Eliquis 5mg  BID for afib.  Will refill rx.

## 2021-11-25 ENCOUNTER — Other Ambulatory Visit: Payer: Medicare Other

## 2021-11-26 ENCOUNTER — Other Ambulatory Visit: Payer: Self-pay

## 2021-11-29 ENCOUNTER — Encounter: Payer: Self-pay | Admitting: Internal Medicine

## 2021-11-29 ENCOUNTER — Other Ambulatory Visit: Payer: Self-pay

## 2021-11-29 ENCOUNTER — Ambulatory Visit (INDEPENDENT_AMBULATORY_CARE_PROVIDER_SITE_OTHER): Payer: PPO | Admitting: Internal Medicine

## 2021-11-29 ENCOUNTER — Other Ambulatory Visit: Payer: Self-pay | Admitting: General Surgery

## 2021-11-29 ENCOUNTER — Ambulatory Visit (INDEPENDENT_AMBULATORY_CARE_PROVIDER_SITE_OTHER): Payer: PPO

## 2021-11-29 VITALS — BP 142/82 | HR 71 | Temp 97.9°F | Ht 65.0 in | Wt 170.0 lb

## 2021-11-29 DIAGNOSIS — J411 Mucopurulent chronic bronchitis: Secondary | ICD-10-CM | POA: Diagnosis not present

## 2021-11-29 DIAGNOSIS — R739 Hyperglycemia, unspecified: Secondary | ICD-10-CM

## 2021-11-29 DIAGNOSIS — I10 Essential (primary) hypertension: Secondary | ICD-10-CM

## 2021-11-29 DIAGNOSIS — Z853 Personal history of malignant neoplasm of breast: Secondary | ICD-10-CM

## 2021-11-29 DIAGNOSIS — R059 Cough, unspecified: Secondary | ICD-10-CM

## 2021-11-29 DIAGNOSIS — I4891 Unspecified atrial fibrillation: Secondary | ICD-10-CM

## 2021-11-29 DIAGNOSIS — Z1231 Encounter for screening mammogram for malignant neoplasm of breast: Secondary | ICD-10-CM

## 2021-11-29 DIAGNOSIS — E7800 Pure hypercholesterolemia, unspecified: Secondary | ICD-10-CM | POA: Diagnosis not present

## 2021-11-29 DIAGNOSIS — I700 Atherosclerosis of aorta: Secondary | ICD-10-CM | POA: Diagnosis not present

## 2021-11-29 DIAGNOSIS — J479 Bronchiectasis, uncomplicated: Secondary | ICD-10-CM

## 2021-11-29 DIAGNOSIS — G4733 Obstructive sleep apnea (adult) (pediatric): Secondary | ICD-10-CM | POA: Diagnosis not present

## 2021-11-29 DIAGNOSIS — I495 Sick sinus syndrome: Secondary | ICD-10-CM

## 2021-11-29 DIAGNOSIS — E039 Hypothyroidism, unspecified: Secondary | ICD-10-CM

## 2021-11-29 DIAGNOSIS — K219 Gastro-esophageal reflux disease without esophagitis: Secondary | ICD-10-CM | POA: Diagnosis not present

## 2021-11-29 DIAGNOSIS — R251 Tremor, unspecified: Secondary | ICD-10-CM

## 2021-11-29 DIAGNOSIS — Z85850 Personal history of malignant neoplasm of thyroid: Secondary | ICD-10-CM

## 2021-11-29 DIAGNOSIS — R0602 Shortness of breath: Secondary | ICD-10-CM

## 2021-11-29 LAB — BASIC METABOLIC PANEL
BUN: 15 mg/dL (ref 6–23)
CO2: 31 mEq/L (ref 19–32)
Calcium: 9.9 mg/dL (ref 8.4–10.5)
Chloride: 104 mEq/L (ref 96–112)
Creatinine, Ser: 0.84 mg/dL (ref 0.40–1.20)
GFR: 62.04 mL/min (ref >60.00)
Glucose, Bld: 116 mg/dL — ABNORMAL HIGH (ref 70–99)
Potassium: 4.6 mEq/L (ref 3.5–5.1)
Sodium: 141 mEq/L (ref 135–145)

## 2021-11-29 LAB — HEPATIC FUNCTION PANEL
ALT: 11 U/L (ref 0–35)
AST: 15 U/L (ref 0–37)
Albumin: 4 g/dL (ref 3.5–5.2)
Alkaline Phosphatase: 117 U/L (ref 39–117)
Bilirubin, Direct: 0.1 mg/dL (ref 0.0–0.3)
Total Bilirubin: 0.6 mg/dL (ref 0.2–1.2)
Total Protein: 6.6 g/dL (ref 6.0–8.3)

## 2021-11-29 LAB — LIPID PANEL
Cholesterol: 156 mg/dL (ref 0–200)
HDL: 36.8 mg/dL — ABNORMAL LOW (ref >39.00)
LDL Cholesterol: 85 mg/dL (ref 0–99)
NonHDL: 119.68
Total CHOL/HDL Ratio: 4
Triglycerides: 172 mg/dL — ABNORMAL HIGH (ref 0.0–149.0)
VLDL: 34.4 mg/dL (ref 0.0–40.0)

## 2021-11-29 LAB — CBC WITH DIFFERENTIAL/PLATELET
Basophils Absolute: 0.1 10*3/uL (ref 0.0–0.1)
Basophils Relative: 1 % (ref 0.0–3.0)
Eosinophils Absolute: 0.2 10*3/uL (ref 0.0–0.7)
Eosinophils Relative: 2.7 % (ref 0.0–5.0)
HCT: 40.5 % (ref 36.0–46.0)
Hemoglobin: 13.4 g/dL (ref 12.0–15.0)
Lymphocytes Relative: 19.1 % (ref 12.0–46.0)
Lymphs Abs: 1.2 10*3/uL (ref 0.7–4.0)
MCHC: 33.1 g/dL (ref 30.0–36.0)
MCV: 91.7 fl (ref 78.0–100.0)
Monocytes Absolute: 0.6 10*3/uL (ref 0.1–1.0)
Monocytes Relative: 10 % (ref 3.0–12.0)
Neutro Abs: 4.3 10*3/uL (ref 1.4–7.7)
Neutrophils Relative %: 67.2 % (ref 43.0–77.0)
Platelets: 183 10*3/uL (ref 150.0–400.0)
RBC: 4.42 Mil/uL (ref 3.87–5.11)
RDW: 12.9 % (ref 11.5–15.5)
WBC: 6.4 10*3/uL (ref 4.0–10.5)

## 2021-11-29 LAB — HEMOGLOBIN A1C: Hgb A1c MFr Bld: 6.4 % (ref 4.6–6.5)

## 2021-11-29 MED ORDER — AZITHROMYCIN 250 MG PO TABS: ORAL_TABLET | ORAL | 0 refills | Status: AC

## 2021-11-29 MED ORDER — PREDNISONE 10 MG PO TABS: ORAL_TABLET | ORAL | 0 refills | Status: DC

## 2021-11-29 MED ORDER — ALIGN 4 MG PO CAPS: ORAL_CAPSULE | ORAL | 0 refills | Status: DC

## 2021-11-29 NOTE — Patient Instructions (Addendum)
Use the inhaler as directed.  ? ?Align (probiotic) sent in to pharmacy ? ?Robitussin DM twice a day as needed for cough and congestion.  ?

## 2021-11-29 NOTE — Progress Notes (Signed)
Patient ID: Kerri Mills, female   DOB: 04-Jul-1931, 88 y.o.   MRN: [id number]   Subjective:    Patient ID: Kerri Mills, female    DOB: 1931-07-21, 88 y.o.   MRN: [id number]  This visit occurred during the SARS-CoV-2 public health emergency.  Safety protocols were in place, including screening questions prior to the visit, additional usage of staff PPE, and extensive cleaning of exam room while observing appropriate contact time as indicated for disinfecting solutions.   Patient here for a scheduled follow up.   Chief Complaint  Patient presents with   Follow-up    10 week follow up    .   HPI Here to follow up regarding her blood pressure, afib and cholesterol.  Was recently evaluated for UTI.  Treated.  Symptoms resolved.  Reports after the UTI, she got a respiratory virus.  Has noticed some persistent increased congestion - runny nose and cough.  Is coughing up some colored mucus at times.  Previously has had issues with sob - with increased exertion.  (For example, rushing to the car, etc).  Feels her breathing has progressively worsened - more so after the respiratory infection.  Noticed getting some sob with walking.  Relates has been worse since the increased congestion.  Has not been using her inhaler.  Has lost weight.  States has decreased eating sweets.  No nausea or vomiting. No acid reflux.  Increased stress - dog passed.  Discussed.  Has good support.  Miralax - bowels ok.    Past Medical History:  Diagnosis Date   Allergy    Anxiety    Arthritis    Atypical chest pain    a. 10/2018 MV: small, fixed apical defect possibly 2/2 attenuation artifact. No ischemia.  EF 68%.   Clotting disorder ([place])    Colitis    Depression    Diverticulitis 2013   Gastric ulcer    GERD (gastroesophageal reflux disease)    History of echocardiogram    a. 09/2019 Echo: EF 55-60%, mod LVH. Mildly dil LA. Triv MR/TR.   Hypercholesterolemia    Hypertension    Infiltrating lobular carcinoma of  left breast 2011   T2,N0, ER: 90%; PR 0%; Her 2 neu not amplified. [facility]).   Melanoma ([place]) 1997   Melanoma in situ of upper extremity ([place]) 03/19/2011   Persistent atrial fibrillation ([place])    a. CHADS2VASc => 4 (HTN, age x 2, female)   Personal history of radiation therapy 2011   BREAST CA   Seroma    HISTORY OF LFT BREAST   Sleep apnea    Thyroid cancer ([place]) 1992   Past Surgical History:  Procedure Laterality Date   ABDOMINAL HYSTERECTOMY  1973   partial   BREAST BIOPSY Left 02-13-13   BENIGN BREAST TISSUE WITH CHANGES CONSISTENT WITH FAT NECROSIS   BREAST BIOPSY Left 01/21/2015   bx done in [place] office 11:00 left 6-8cmfn   BREAST EXCISIONAL BIOPSY Left 1995   neg   BREAST EXCISIONAL BIOPSY Left 2011   Breast cancer radiation   BREAST LUMPECTOMY Left 2011   BREAST CA   CARDIAC CATHETERIZATION     CHOLECYSTECTOMY     COLONOSCOPY  2013   COLONOSCOPY WITH PROPOFOL N/A 06/09/2021   Procedure: COLONOSCOPY WITH PROPOFOL;  Surgeon: Jonathon Bellows, MD;  Location: [facility] ENDOSCOPY;  Service: Gastroenterology;  Laterality: N/A;   MELANOMA EXCISION     RT UPPER ARM   PACEMAKER IMPLANT N/A 01/10/2020  Procedure: PACEMAKER IMPLANT;  Surgeon: Constance Haw, MD;  Location: [facility] LAB;  Service: Cardiovascular;  Laterality: N/A;   PARTIAL HYSTERECTOMY     bleeding, ovaries in place.     THYROID SURGERY  1992   FOR THYROID CANCER   TONSILLECTOMY     Family History  Problem Relation Age of Onset   Heart disease Mother    Cancer Brother        lung    Cancer Sister        breast   Breast cancer Neg Hx    Social History   Socioeconomic History   Marital status: Widowed    Spouse name: Not on file   Number of children: Not on file   Years of education: Not on file   Highest education level: Not on file  Occupational History   Not on file  Tobacco Use   Smoking status: Never   Smokeless tobacco: Never   Tobacco comments:    never  Substance and  Sexual Activity   Alcohol use: Yes    Comment: once in a while   Drug use: No   Sexual activity: Never  Other Topics Concern   Not on file  Social History Narrative   Independent and baseline. Lives by herself   Social Determinants of Health   Financial Resource Strain: Low Risk    Difficulty of Paying Living Expenses: Not hard at all  Food Insecurity: Not on file  Transportation Needs: Not on file  Physical Activity: Not on file  Stress: Stress Concern Present   Feeling of Stress : To some extent  Social Connections: Not on file    Review of Systems  Constitutional:        Has lost weight.  When sick, has had decreased appetite.  She is eating now, but not as much.  Has cut down on sweets.   HENT:  Positive for congestion. Negative for sinus pressure.   Respiratory:  Positive for cough and shortness of breath. Negative for chest tightness.        Some wheezing.  Cardiovascular:  Negative for chest pain, palpitations and leg swelling.  Gastrointestinal:  Negative for abdominal pain, diarrhea, nausea and vomiting.  Genitourinary:  Negative for difficulty urinating and dysuria.  Musculoskeletal:  Negative for joint swelling and myalgias.  Skin:  Negative for color change and rash.  Neurological:  Negative for dizziness, light-headedness and headaches.  Psychiatric/Behavioral:  Negative for agitation and dysphoric mood.       Objective:     BP (!) 142/82 (BP Location: Right Arm, Patient Position: Sitting, Cuff Size: Large)    Pulse 71    Temp 97.9 F (36.6 C) (Oral)    Ht 5' 5" (1.651 m)    Wt 170 lb (77.1 kg)    SpO2 91%    BMI 28.29 kg/m  Wt Readings from Last 3 Encounters:  11/29/21 170 lb (77.1 kg)  10/26/21 175 lb 6.4 oz (79.6 kg)  09/21/21 177 lb (80.3 kg)    Physical Exam Vitals reviewed.  Constitutional:      General: She is not in acute distress.    Appearance: Normal appearance.  HENT:     Head: Normocephalic and atraumatic.     Right Ear: External ear  normal.     Left Ear: External ear normal.  Eyes:     General: No scleral icterus.       Right eye: No discharge.  Left eye: No discharge.     Conjunctiva/sclera: Conjunctivae normal.  Neck:     Thyroid: No thyromegaly.  Cardiovascular:     Rate and Rhythm: Normal rate.     Comments: Rate controlled - 68 on my check.  Pulmonary:     Effort: No respiratory distress.     Breath sounds: Normal breath sounds.     Comments: Increased cough with forced expiration.  Abdominal:     General: Bowel sounds are normal.     Palpations: Abdomen is soft.     Tenderness: There is no abdominal tenderness.  Musculoskeletal:        General: No swelling or tenderness.     Cervical back: Neck supple. No tenderness.  Lymphadenopathy:     Cervical: No cervical adenopathy.  Skin:    Findings: No erythema or rash.  Neurological:     Mental Status: She is alert.  Psychiatric:        Mood and Affect: Mood normal.        Behavior: Behavior normal.     Outpatient Encounter Medications as of 11/29/2021  Medication Sig   acetaminophen (TYLENOL) 650 MG CR tablet Take 650 mg by mouth every 8 (eight) hours as needed for pain.   azelastine (ASTELIN) 0.1 % nasal spray Place 1 spray into both nostrils 2 (two) times daily. Use in each nostril as directed   azithromycin (ZITHROMAX) 250 MG tablet Take 2 tablets on day 1, then 1 tablet daily on days 2 through 5   cholecalciferol (VITAMIN D3) 25 MCG (1000 UNIT) tablet Take 1,000 Units by mouth daily.   DULoxetine (CYMBALTA) 60 MG capsule TAKE 1 CAPSULE BY MOUTH AT BEDTIME.   ELIQUIS 5 MG TABS tablet TAKE 1 TABLET BY MOUTH TWICE A DAY   furosemide (LASIX) 20 MG tablet TAKE 1 TABLET BY MOUTH DAILY AS NEEDED. TAKE WITH POTASSIUM.   gabapentin (NEURONTIN) 300 MG capsule TAKE 2 CAPSULES BY MOUTH AT BEDTIME   levothyroxine (SYNTHROID) 100 MCG tablet TAKE 1 TABLET EVERY DAY ON EMPTY STOMACHWITH A GLASS OF WATER AT LEAST 30-60 MINBEFORE BREAKFAST   lovastatin  (MEVACOR) 40 MG tablet TAKE 1 TABLET BY MOUTH DAILY   metoprolol tartrate 75 MG TABS Take 75 mg by mouth 2 (two) times daily.   Multiple Vitamins-Minerals (PRESERVISION AREDS 2+MULTI VIT PO) Take by mouth.   omeprazole (PRILOSEC) 20 MG capsule Take 1 capsule (20 mg total) by mouth daily.   Polyethyl Glycol-Propyl Glycol (SYSTANE OP) Place 1 drop into both eyes daily as needed (for dry eyes).   polyethylene glycol powder (GLYCOLAX/MIRALAX) 17 GM/SCOOP powder Take 17 g by mouth daily.   potassium chloride (KLOR-CON) 10 MEQ tablet TAKE 2 TABLETS BY MOUTH DAILY AS NEEDED.WITH FUROSEMIDE.   predniSONE (DELTASONE) 10 MG tablet Take 4 tablets x 1 day and then decrease by 1/2 tablet per day until down to zero mg.   White Petrolatum-Mineral Oil (GENTEAL TEARS NIGHT-TIME OP) Place 1 application into both eyes at bedtime. Night time ointment 3.5g   [DISCONTINUED] Probiotic Product (ALIGN) 4 MG CAPS One capsule daily while on antibiotic and for two weeks after complete antibiotic   Probiotic Product (ALIGN) 4 MG CAPS One capsule daily while on antibiotic and for two weeks after complete antibiotic   [DISCONTINUED] nitrofurantoin, macrocrystal-monohydrate, (MACROBID) 100 MG capsule Take 1 capsule (100 mg total) by mouth 2 (two) times daily. (Patient not taking: Reported on 11/29/2021)   No facility-administered encounter medications on file as of 11/29/2021.  Lab Results  Component Value Date   WBC 6.4 11/29/2021   HGB 13.4 11/29/2021   HCT 40.5 11/29/2021   PLT 183.0 11/29/2021   GLUCOSE 116 (H) 11/29/2021   CHOL 156 11/29/2021   TRIG 172.0 (H) 11/29/2021   HDL 36.80 (L) 11/29/2021   LDLDIRECT 90.0 03/15/2021   LDLCALC 85 11/29/2021   ALT 11 11/29/2021   AST 15 11/29/2021   NA 141 11/29/2021   K 4.6 11/29/2021   CL 104 11/29/2021   CREATININE 0.84 11/29/2021   BUN 15 11/29/2021   CO2 31 11/29/2021   TSH 1.39 05/12/2021   INR 1.5 (H) 11/21/2018   HGBA1C 6.4 11/29/2021       Assessment &  Plan:   Problem List Items Addressed This Visit     Aortic atherosclerosis ([place])    On lovastatin.       Atrial fibrillation [facility])    S/p pacemaker placement.  On eliquis and metoprolol.  Overall appears to be stable.      Bronchiectasis without complication (HCC)    Treat current infection as outlined.  zpak and prednisone taper. Check cxr.  Keep appt with pulmonary. Follow closely.  Call with update.       Cough - Primary    Has been evaluated and followed by pulmonary.  Check cxr as outlined.  Treat infection - zpak and prednisone taper.  Inhaler as directed.        Relevant Orders   CBC with Differential/Platelet (Completed)   Essential hypertension    On metoprolol and lasix.  Blood pressure as outlined.  Continue current medication regimen. Hold on making changes.  treat infection.  Follow pressures.  Follow metabolic panel.       GERD (gastroesophageal reflux disease)    Upper symptoms appear to be controlled on omeprazole.       Relevant Medications   Probiotic Product (ALIGN) 4 MG CAPS   History of breast cancer    Mammogram 12/21/20 - Birads I.  Had CT scan as outlined.  Changes as outlined:  enlarging partially calcified soft tissue mass in the left breast.  Per report - has enlarged over the past 2 years and is now more infiltrative in appearance in direct contact with the underlying left pectoralis major musculature. Findings  - highly suspicious for locally recurrent breast cancer.  Just evaluated by Dr Bary Castilla.  S/p biopsy - fat necrosis.  Recommended f/u in 12/2021.         History of thyroid cancer    On thyroid replacement.  Follow tsh.       Hypercholesterolemia    On lovastatin.  Continue diet and exercise.  Follow fasting profile and liver panel.       Hyperglycemia    Low carb diet and exercise.  Follow met b and a1c.        Hypothyroid    On thyroid replacement.  Follow tsh.        Mucopurulent chronic bronchitis ([place])    Has seen pulmonary.   With increased cough and congestion currently.  Some increased sob.  Check cxr.  Use inhaler as directed.  Robitussin DM as directed. zpak and prednisone taper.  Follow closely.  Call with update.       Relevant Medications   azithromycin (ZITHROMAX) 250 MG tablet   Obstructive sleep apnea    CPAP.       SOB (shortness of breath)    Cough and congestion as outlined.  Feel sob  worsened with increased cough and congestion.  Reports had recent EKG.  States checked out ok. Discussed f/u EKG and cardiac w/up.  Relates symptoms to the congestion as outlined.  Check cxr.  Treat infection. Inhaler as directed. Prednisone taper.  Follow closely.        Relevant Orders   DG Chest 2 View (Completed)   Tachy-brady syndrome (HCC)    Seeing Dr Rockey Situ and EP.  On metoprolol 34m bid.  Follow.       Tremor    appt with neurology.         CEinar Pheasant MD

## 2021-11-30 ENCOUNTER — Telehealth: Payer: Self-pay

## 2021-11-30 ENCOUNTER — Encounter: Payer: Self-pay | Admitting: Internal Medicine

## 2021-11-30 NOTE — Assessment & Plan Note (Signed)
Has been evaluated and followed by pulmonary.  Check cxr as outlined.  Treat infection - zpak and prednisone taper.  Inhaler as directed.   ?

## 2021-11-30 NOTE — Assessment & Plan Note (Signed)
On lovastatin 

## 2021-11-30 NOTE — Assessment & Plan Note (Signed)
Treat current infection as outlined.  zpak and prednisone taper. Check cxr.  Keep appt with pulmonary. Follow closely.  Call with update.  ?

## 2021-11-30 NOTE — Telephone Encounter (Signed)
Pt returning call

## 2021-11-30 NOTE — Assessment & Plan Note (Signed)
Low carb diet and exercise.  Follow met b and a1c.   ?

## 2021-11-30 NOTE — Assessment & Plan Note (Signed)
On thyroid replacement.  Follow tsh.  

## 2021-11-30 NOTE — Telephone Encounter (Signed)
-----   Message from Einar Pheasant, MD sent at 11/30/2021  3:55 AM EST ----- ?Notify Kerri Mills that her cholesterol levels are relatively stable when compared to the previous check.  Continue lovastatin.  Overall sugar control stable.  White blood cell count, hgb, kidney function tests and liver function tests are wnl.  Also, her cxr reveals her lungs to be clear - no pneumonia, fluid.  Continue treatment as we discussed.  Please confirm she is doing ok.   ?

## 2021-11-30 NOTE — Assessment & Plan Note (Signed)
On lovastatin.  Continue diet and exercise.  Follow fasting profile and liver panel.  

## 2021-11-30 NOTE — Assessment & Plan Note (Signed)
Cough and congestion as outlined.  Feel sob worsened with increased cough and congestion.  Reports had recent EKG.  States checked out ok. Discussed f/u EKG and cardiac w/up.  Relates symptoms to the congestion as outlined.  Check cxr.  Treat infection. Inhaler as directed. Prednisone taper.  Follow closely.   ?

## 2021-11-30 NOTE — Assessment & Plan Note (Signed)
Mammogram 12/21/20 - Birads I.  Had CT scan as outlined.  Changes as outlined:  enlarging partially calcified soft tissue mass in the left breast.  Per report - has enlarged over the past 2 years and is now more infiltrative in appearance in direct contact with the underlying left pectoralis major musculature. Findings  - highly suspicious for locally recurrent breast cancer.  Just evaluated by Dr Bary Castilla.  S/p biopsy - fat necrosis.  Recommended f/u in 12/2021.    ?

## 2021-11-30 NOTE — Assessment & Plan Note (Signed)
On metoprolol and lasix.  Blood pressure as outlined.  Continue current medication regimen. Hold on making changes.  treat infection.  Follow pressures.  Follow metabolic panel.  ?

## 2021-11-30 NOTE — Assessment & Plan Note (Signed)
Seeing Dr Gollan and EP.  On metoprolol 75mg bid.  Follow.  

## 2021-11-30 NOTE — Assessment & Plan Note (Signed)
Has seen pulmonary.  With increased cough and congestion currently.  Some increased sob.  Check cxr.  Use inhaler as directed.  Robitussin DM as directed. zpak and prednisone taper.  Follow closely.  Call with update.  ?

## 2021-11-30 NOTE — Assessment & Plan Note (Signed)
Upper symptoms appear to be controlled on omeprazole.  

## 2021-11-30 NOTE — Assessment & Plan Note (Signed)
appt with neurology.  ?

## 2021-11-30 NOTE — Assessment & Plan Note (Signed)
S/p pacemaker placement.  On eliquis and metoprolol.  Overall appears to be stable. 

## 2021-11-30 NOTE — Telephone Encounter (Signed)
Patient aware of results.

## 2021-11-30 NOTE — Assessment & Plan Note (Signed)
CPAP.  

## 2021-11-30 NOTE — Telephone Encounter (Signed)
LMTCB for labs. 

## 2021-12-09 DIAGNOSIS — G250 Essential tremor: Secondary | ICD-10-CM | POA: Diagnosis not present

## 2021-12-09 DIAGNOSIS — E538 Deficiency of other specified B group vitamins: Secondary | ICD-10-CM | POA: Diagnosis not present

## 2021-12-09 DIAGNOSIS — F4323 Adjustment disorder with mixed anxiety and depressed mood: Secondary | ICD-10-CM | POA: Diagnosis not present

## 2021-12-09 DIAGNOSIS — E559 Vitamin D deficiency, unspecified: Secondary | ICD-10-CM | POA: Diagnosis not present

## 2021-12-15 ENCOUNTER — Other Ambulatory Visit: Payer: Self-pay | Admitting: Internal Medicine

## 2021-12-29 ENCOUNTER — Encounter: Payer: Self-pay | Admitting: Internal Medicine

## 2021-12-29 ENCOUNTER — Ambulatory Visit: Payer: PPO | Admitting: Internal Medicine

## 2021-12-29 VITALS — BP 132/74 | HR 87 | Temp 97.8°F | Ht 65.0 in | Wt 169.4 lb

## 2021-12-29 DIAGNOSIS — R918 Other nonspecific abnormal finding of lung field: Secondary | ICD-10-CM

## 2021-12-29 DIAGNOSIS — G4733 Obstructive sleep apnea (adult) (pediatric): Secondary | ICD-10-CM

## 2021-12-29 NOTE — Progress Notes (Signed)
? ?'@Patient'$  ID: Kerri Mills, female    DOB: 04-Sep-1931, 88 y.o.   MRN: [id number] ? ? ?Tests ?May 04, 2021 that showed mild restriction with no significant airflow obstruction.  FEV1 78%, ratio 78, FVC 74%.  No significant bronchodilator response DLCO 76%. ?CT chest completed on May 04, 2021 which showed resolved right lower lobe nodule.  Areas of new nodularity measuring up to 8 mm in the periphery of the right lower lobe.  Mild bronchiectasis.  Micro and macro nodularity.  Incidental finding of a soft tissue mass in the left breast. ? ? ? ?Chief Complaint  ?Patient presents with  ? Follow-up  ?  Wearing cpap avg 5hr nightly--feels mask is leaking. C/o prod cough with grey sputum.   ? ? ?Referring provider: ?Einar Pheasant, MD ? ?HPI: ?88 year old female never smoker followed for chronic bronchitis and bronchiectasis ?Medical history significant for Breast cancer, thyroid cancer and melanoma .  ? ? ?12/29/2021  ?Follow up: Chronic Bronchitis and Bronchiectasis  ?Patient returns for a 71-monthfollow-up.  Patient has underlying chronic bronchitis and bronchiectasis.  Patient with abnormal CT chest and was given prednisone and antibiotics several rounds also had a UTI ?Symptoms have subsided with her cough ?Abnormal CT chest reviewed in August 2022 ?Recommend repeat CT chest assessment in the next 3 months for follow-up ? ?Previous COVID-19 infection July 2022 ? ?Regarding her OSA on CPAP ?Her dog Precious passed away and since then she has been noncompliant ?She mostly sleeps in her recliner and forgets to place CPAP ?I have strongly advised to be more compliant ?Current compliance report download reviewed with patient ?53% compliance for days 47% compliance for greater than 4 hours ?AHI reduced to 6 auto CPAP 5-12.  ? ?No exacerbation at this time ?No evidence of heart failure at this time ?No evidence or signs of infection at this time ?No respiratory distress ?No fevers, chills, nausea, vomiting, diarrhea ?No  evidence of lower extremity edema ?No evidence hemoptysis ? ? ? ?Allergies  ?Allergen Reactions  ? Atorvastatin Other (See Comments)  ?  Other reaction(s): Other (See Comments) ?STIFFNESS AND SORE ?STIFFNESS AND SORE  ? Lipitor [Atorvastatin Calcium] Other (See Comments)  ?  Stiffness & soreness  ? Penicillins Rash  ?  REACTION: Unknown reaction  ? ? ?Immunization History  ?Administered Date(s) Administered  ? Fluad Quad(high Dose 65+) 06/06/2019, 07/07/2020, 06/21/2021  ? Influenza Split 07/18/2014  ? Influenza, High Dose Seasonal PF 07/01/2015, 06/13/2016, 05/23/2017, 06/25/2018  ? Influenza,inj,Quad PF,6+ Mos 06/11/2013  ? Moderna Covid-19 Vaccine Bivalent Booster 181yr& up 09/04/2021  ? Moderna Sars-Covid-2 Vaccination 10/08/2019, 11/05/2019, 07/24/2020  ? Pneumococcal Conjugate-13 12/18/2013  ? Pneumococcal Polysaccharide-23 07/01/2015  ? Tdap 11/15/2016  ? Zoster Recombinat (Shingrix) 06/25/2018  ? ? ?Past Medical History:  ?Diagnosis Date  ? Allergy   ? Anxiety   ? Arthritis   ? Atypical chest pain   ? a. 10/2018 MV: small, fixed apical defect possibly 2/2 attenuation artifact. No ischemia.  EF 68%.  ? [place]  ? Colitis   ? Depression   ? Diverticulitis 2013  ? Gastric ulcer   ? GERD (gastroesophageal reflux disease)   ? History of echocardiogram   ? a. 09/2019 Echo: EF 55-60%, mod LVH. Mildly dil LA. Triv MR/TR.  ? Hypercholesterolemia   ? Hypertension   ? Infiltrating lobular carcinoma of left breast 2011  ? T2,N0, ER: 90%; PR 0%; [facility]  ? Melanoma (  [place]) 1997  ? Melanoma in situ of upper extremity ([place]) 03/19/2011  ? Persistent atrial fibrillation ([place])   ? a. CHADS2VASc => 4 (HTN, age x 2, female)  ? Personal history of radiation therapy 2011  ? BREAST CA  ? Seroma   ? HISTORY OF LFT BREAST  ? Sleep apnea   ? Thyroid cancer ([place]) 1992  ? ? ?Tobacco History: ?Social History  ? ?Tobacco Use  ?Smoking Status Never  ?Smokeless Tobacco Never  ?Tobacco  Comments  ? never  ? ?Counseling given: Not Answered ?Tobacco comments: never ? ? ? ?Outpatient Medications Prior to Visit  ?Medication Sig Dispense Refill  ? acetaminophen (TYLENOL) 650 MG CR tablet Take 650 mg by mouth every 8 (eight) hours as needed for pain.    ? azelastine (ASTELIN) 0.1 % nasal spray Place 1 spray into both nostrils 2 (two) times daily. Use in each nostril as directed (Patient taking differently: Place 1 spray into both nostrils as needed. Use in each nostril as directed) 30 mL 0  ? cholecalciferol (VITAMIN D3) 25 MCG (1000 UNIT) tablet Take 1,000 Units by mouth daily.    ? DULoxetine (CYMBALTA) 60 MG capsule TAKE 1 CAPSULE BY MOUTH AT BEDTIME. 90 capsule 1  ? ELIQUIS 5 MG TABS tablet TAKE 1 TABLET BY MOUTH TWICE A DAY 180 tablet 1  ? furosemide (LASIX) 20 MG tablet TAKE 1 TABLET BY MOUTH DAILY AS NEEDED. TAKE WITH POTASSIUM. 90 tablet 3  ? gabapentin (NEURONTIN) 300 MG capsule TAKE 2 CAPSULES BY MOUTH AT BEDTIME 180 capsule 1  ? levothyroxine (SYNTHROID) 100 MCG tablet TAKE 1 TABLET EVERY DAY ON EMPTY STOMACHWITH A GLASS OF WATER AT LEAST 30-60 MINBEFORE BREAKFAST 90 tablet 1  ? lovastatin (MEVACOR) 40 MG tablet TAKE 1 TABLET BY MOUTH DAILY 90 tablet 3  ? metoprolol tartrate 75 MG TABS Take 75 mg by mouth 2 (two) times daily. 180 tablet 3  ? Multiple Vitamins-Minerals (PRESERVISION AREDS 2+MULTI VIT PO) Take by mouth.    ? omeprazole (PRILOSEC) 20 MG capsule Take 1 capsule (20 mg total) by mouth daily. 90 capsule 3  ? Polyethyl Glycol-Propyl Glycol (SYSTANE OP) Place 1 drop into both eyes daily as needed (for dry eyes).    ? polyethylene glycol powder (GLYCOLAX/MIRALAX) 17 GM/SCOOP powder Take 17 g by mouth daily. 850 g 1  ? potassium chloride (KLOR-CON) 10 MEQ tablet TAKE 2 TABLETS BY MOUTH DAILY AS NEEDED.WITH FUROSEMIDE. 60 tablet 1  ? Probiotic Product (ALIGN) 4 MG CAPS One capsule daily while on antibiotic and for two weeks after complete antibiotic 30 capsule 0  ? White  Petrolatum-Mineral Oil (GENTEAL TEARS NIGHT-TIME OP) Place 1 application into both eyes at bedtime. Night time ointment 3.5g    ? predniSONE (DELTASONE) 10 MG tablet Take 4 tablets x 1 day and then decrease by 1/2 tablet per day until down to zero mg. 18 tablet 0  ? ?No facility-administered medications prior to visit.  ? ? ? ?Review of Systems: ? ?Gen:  Denies  fever, sweats, chills weight loss  ?HEENT: Denies blurred vision, double vision, ear pain, eye pain, hearing loss, nose bleeds, sore throat ?Cardiac:  No dizziness, chest pain or heaviness, chest tightness,edema, No JVD ?Resp:   No cough, -sputum production, -shortness of breath,-wheezing, -hemoptysis,  ?Other:  All other systems negative ? ? ? ? ?Physical Exam ? ?BP 132/74 (BP Location: Left Arm, Cuff Size: Normal)   Pulse 87   Temp 97.8 ?F (36.6 ?C) (Temporal)  Ht '5\' 5"'$  (1.651 m)   Wt 169 lb 6.4 oz (76.8 kg)   SpO2 96%   BMI 28.19 kg/m?  ?Physical Examination:  ? ?General Appearance: No distress  ?EYES PERRLA, EOM intact.   ?NECK Supple, No JVD ?Pulmonary: normal breath sounds, No wheezing.  ?CardiovascularNormal S1,S2.  No m/r/g.   ?ALL OTHER ROS ARE NEGATIVE ? ? ?Lab Results: ? ? ?ASSESSMENT AND PLAN ? ?Bronchiectasis without complication ([place]) ?No signs of infection at this time ?Continue flutter valve as tolerated ?Repeat CT chest in 3 months ?Symptoms are subsiding however will need follow-up CT scans ?Restart Flutter valve Three times a day  .  ?Mucinex Twice daily  As needed  cough/congestion  ?Albuterol inhaler 1-2 puffs every 6hr as needed for wheezing /shortness of breath  ? ?Regarding OSA ?Continue CPAP on a daily basis ?Avoid sleeping in the recliner but if she does recommend using CPAP machine ?Patient is grieving over loss dog ? ?Follow up with Primary Provider regarding abnormal breast finding .  ? ? ? ?Abnormal chest CT ?Right lung nodularity consider MAI or atypical pneumonia ?No significant symptoms at this time ?Recommend repeat  CT chest at next visit after discussion ? ? ?MEDICATION ADJUSTMENTS/LABS AND TESTS ORDERED: ?Continue CPAP as prescribed ?Recommend using every day ?Follow up 3 months-will assess with CT chest ? ? ?CURRENT MEDICATIONS REVIEWED

## 2021-12-29 NOTE — Patient Instructions (Addendum)
Continue CPAP as prescribed ?Recommend using every day ?Follow up 3 months-will assess with CT chest ?

## 2022-01-10 ENCOUNTER — Ambulatory Visit
Admission: RE | Admit: 2022-01-10 | Discharge: 2022-01-10 | Disposition: A | Discharge status: Home / Self Care | Payer: PPO | Source: Ambulatory Visit | Attending: General Surgery | Admitting: General Surgery

## 2022-01-10 DIAGNOSIS — Z1231 Encounter for screening mammogram for malignant neoplasm of breast: Secondary | ICD-10-CM | POA: Insufficient documentation

## 2022-01-12 ENCOUNTER — Encounter: Payer: Self-pay | Admitting: Internal Medicine

## 2022-01-12 ENCOUNTER — Ambulatory Visit (INDEPENDENT_AMBULATORY_CARE_PROVIDER_SITE_OTHER): Payer: PPO | Admitting: Internal Medicine

## 2022-01-12 DIAGNOSIS — G4733 Obstructive sleep apnea (adult) (pediatric): Secondary | ICD-10-CM

## 2022-01-12 DIAGNOSIS — Z85850 Personal history of malignant neoplasm of thyroid: Secondary | ICD-10-CM | POA: Diagnosis not present

## 2022-01-12 DIAGNOSIS — K219 Gastro-esophageal reflux disease without esophagitis: Secondary | ICD-10-CM | POA: Diagnosis not present

## 2022-01-12 DIAGNOSIS — I4891 Unspecified atrial fibrillation: Secondary | ICD-10-CM

## 2022-01-12 DIAGNOSIS — J411 Mucopurulent chronic bronchitis: Secondary | ICD-10-CM

## 2022-01-12 DIAGNOSIS — E7800 Pure hypercholesterolemia, unspecified: Secondary | ICD-10-CM

## 2022-01-12 DIAGNOSIS — I10 Essential (primary) hypertension: Secondary | ICD-10-CM

## 2022-01-12 DIAGNOSIS — I700 Atherosclerosis of aorta: Secondary | ICD-10-CM

## 2022-01-12 DIAGNOSIS — Z853 Personal history of malignant neoplasm of breast: Secondary | ICD-10-CM | POA: Diagnosis not present

## 2022-01-12 DIAGNOSIS — F439 Reaction to severe stress, unspecified: Secondary | ICD-10-CM

## 2022-01-12 DIAGNOSIS — R739 Hyperglycemia, unspecified: Secondary | ICD-10-CM | POA: Diagnosis not present

## 2022-01-12 DIAGNOSIS — I495 Sick sinus syndrome: Secondary | ICD-10-CM | POA: Diagnosis not present

## 2022-01-12 DIAGNOSIS — R059 Cough, unspecified: Secondary | ICD-10-CM | POA: Diagnosis not present

## 2022-01-12 DIAGNOSIS — D649 Anemia, unspecified: Secondary | ICD-10-CM

## 2022-01-12 DIAGNOSIS — J479 Bronchiectasis, uncomplicated: Secondary | ICD-10-CM

## 2022-01-12 MED ORDER — ALIGN 4 MG PO CAPS: ORAL_CAPSULE | ORAL | 0 refills | Status: DC

## 2022-01-12 NOTE — Progress Notes (Signed)
Patient ID: Kerri Mills, female   DOB: 06/03/1931, 88 y.o.   MRN: [id number] ? ? ?Subjective:  ? ? Patient ID: Kerri Mills, female    DOB: [birth year], 88 y.o.   MRN: [id number] ? ?This visit occurred during the SARS-CoV-2 public health emergency.  Safety protocols were in place, including screening questions prior to the visit, additional usage of staff PPE, and extensive cleaning of exam room while observing appropriate contact time as indicated for disinfecting solutions.  ? ?Patient here for a scheduled follow up.  ? ?Chief Complaint  ?Patient presents with  ? Follow-up  ?  6 wk follow up - pt reports feeling well. Slight residual cough, but overall much better.  ? .  ? ?HPI ?Has a history of chronic bronchitis and bronchiectasis.  Sees pulmonary.  Just evaluated - Dr Mortimer Fries - 12/29/21.  Recent abnormal CT chest.  Previously treated with abx and prednisone.  She is better.  Some residual minimal cough.  Few of the residents where she is living have covid.  They are quarantined.  She has not been around them.  Apparently has not been using cpap.  Had been falling asleep in the recliner.  Plans to start sleeping in her bed and using cpap regularly.  Recommended f/u CT chest in 3 months.  Still with increased stress.  Still trying to cope with the stress of her dog dying.  No chest pain.  Breathing overall stable.  No abdominal pain.  No increased acid reflux reported.   ? ? ?Past Medical History:  ?Diagnosis Date  ? Allergy   ? Anxiety   ? Arthritis   ? Atypical chest pain   ? a. 10/2018 MV: small, fixed apical defect possibly 2/2 attenuation artifact. No ischemia.  EF 68%.  ? Clotting disorder ([place])   ? Colitis   ? Depression   ? Diverticulitis 2013  ? Gastric ulcer   ? GERD (gastroesophageal reflux disease)   ? History of echocardiogram   ? a. 09/2019 Echo: EF 55-60%, mod LVH. Mildly dil LA. Triv MR/TR.  ? Hypercholesterolemia   ? Hypertension   ? Infiltrating lobular carcinoma of left breast 2011  ? T2,N0, ER: 90%; PR  0%; Her 2 neu not amplified. [facility]).  ? Melanoma ([place]) 1997  ? Melanoma in situ of upper extremity ([place]) 03/19/2011  ? Persistent atrial fibrillation ([place])   ? a. CHADS2VASc => 4 (HTN, age x 2, female)  ? Personal history of radiation therapy 2011  ? BREAST CA  ? Seroma   ? HISTORY OF LFT BREAST  ? Sleep apnea   ? Thyroid cancer ([place]) 1992  ? ?Past Surgical History:  ?Procedure Laterality Date  ? ABDOMINAL HYSTERECTOMY  1973  ? partial  ? BREAST BIOPSY Left 02-13-13  ? BENIGN BREAST TISSUE WITH CHANGES CONSISTENT WITH FAT NECROSIS  ? BREAST BIOPSY Left 01/21/2015  ? bx done in [facility] 11:00 left 6-8cmfn  ? BREAST EXCISIONAL BIOPSY Left 1995  ? neg  ? BREAST EXCISIONAL BIOPSY Left 2011  ? Breast cancer radiation  ? BREAST LUMPECTOMY Left 2011  ? BREAST CA  ? CARDIAC CATHETERIZATION    ? CHOLECYSTECTOMY    ? COLONOSCOPY  2013  ? COLONOSCOPY WITH PROPOFOL N/A 06/09/2021  ? Procedure: COLONOSCOPY WITH PROPOFOL;  Surgeon: Jonathon Bellows, MD;  Location: [facility] ENDOSCOPY;  Service: Gastroenterology;  Laterality: N/A;  ? MELANOMA EXCISION    ? RT UPPER ARM  ? PACEMAKER IMPLANT N/A 01/10/2020  ?  Procedure: PACEMAKER IMPLANT;  Surgeon: Constance Haw, MD;  Location: [place] CV LAB;  Service: Cardiovascular;  Laterality: N/A;  ? PARTIAL HYSTERECTOMY    ? bleeding, ovaries in place.    ? THYROID SURGERY  1992  ? FOR THYROID CANCER  ? TONSILLECTOMY    ? ?Family History  ?Problem Relation Age of Onset  ? Heart disease Mother   ? Cancer Brother   ?     lung   ? Cancer Sister   ?     breast  ? Breast cancer Neg Hx   ? ?Social History  ? ?Socioeconomic History  ? Marital status: Widowed  ?  Spouse name: Not on file  ? Number of children: Not on file  ? Years of education: Not on file  ? Highest education level: Not on file  ?Occupational History  ? Not on file  ?Tobacco Use  ? Smoking status: Never  ? Smokeless tobacco: Never  ? Tobacco comments:  ?  never  ?Substance and Sexual Activity  ? Alcohol use: Yes  ?   Comment: once in a while  ? Drug use: No  ? Sexual activity: Never  ?Other Topics Concern  ? Not on file  ?Social History Narrative  ? Independent and baseline. Lives by herself  ? ?Social Determinants of Health  ? ?Financial Resource Strain: Low Risk   ? Difficulty of Paying Living Expenses: Not hard at all  ?Food Insecurity: Not on file  ?Transportation Needs: Not on file  ?Physical Activity: Not on file  ?Stress: Stress Concern Present  ? Feeling of Stress : To some extent  ?Social Connections: Not on file  ? ? ? ?Review of Systems  ?Constitutional:  Negative for appetite change and unexpected weight change.  ?HENT:  Negative for congestion and sinus pressure.   ?Respiratory:  Positive for cough. Negative for chest tightness and shortness of breath.   ?Cardiovascular:  Negative for chest pain, palpitations and leg swelling.  ?Gastrointestinal:  Negative for abdominal pain, diarrhea, nausea and vomiting.  ?Genitourinary:  Negative for difficulty urinating and dysuria.  ?Musculoskeletal:  Negative for joint swelling and myalgias.  ?Skin:  Negative for color change and rash.  ?Neurological:  Negative for dizziness, light-headedness and headaches.  ?Psychiatric/Behavioral:  Negative for agitation and dysphoric mood.   ? ?   ?Objective:  ?  ? ?BP 128/70 (BP Location: Left Arm, Patient Position: Sitting, Cuff Size: Small)   Pulse 64   Temp 98.7 ?F (37.1 ?C) (Temporal)   Resp 16   Ht '5\' 5"'$  (1.651 m)   Wt 171 lb (77.6 kg)   SpO2 95%   BMI 28.46 kg/m?  ?Wt Readings from Last 3 Encounters:  ?01/12/22 171 lb (77.6 kg)  ?12/29/21 169 lb 6.4 oz (76.8 kg)  ?11/29/21 170 lb (77.1 kg)  ? ? ?Physical Exam ?Vitals reviewed.  ?Constitutional:   ?   General: She is not in acute distress. ?   Appearance: Normal appearance.  ?HENT:  ?   Head: Normocephalic and atraumatic.  ?   Right Ear: External ear normal.  ?   Left Ear: External ear normal.  ?Eyes:  ?   General: No scleral icterus.    ?   Right eye: No discharge.     ?    Left eye: No discharge.  ?   Conjunctiva/sclera: Conjunctivae normal.  ?Neck:  ?   Thyroid: No thyromegaly.  ?Cardiovascular:  ?   Rate and Rhythm: Normal rate and  regular rhythm.  ?Pulmonary:  ?   Effort: No respiratory distress.  ?   Breath sounds: Normal breath sounds. No wheezing.  ?Abdominal:  ?   General: Bowel sounds are normal.  ?   Palpations: Abdomen is soft.  ?   Tenderness: There is no abdominal tenderness.  ?Musculoskeletal:     ?   General: No swelling or tenderness.  ?   Cervical back: Neck supple. No tenderness.  ?Lymphadenopathy:  ?   Cervical: No cervical adenopathy.  ?Skin: ?   Findings: No erythema or rash.  ?Neurological:  ?   Mental Status: She is alert.  ?Psychiatric:     ?   Mood and Affect: Mood normal.     ?   Behavior: Behavior normal.  ? ? ? ?Outpatient Encounter Medications as of 01/12/2022  ?Medication Sig  ? acetaminophen (TYLENOL) 650 MG CR tablet Take 650 mg by mouth every 8 (eight) hours as needed for pain.  ? azelastine (ASTELIN) 0.1 % nasal spray Place 1 spray into both nostrils 2 (two) times daily. Use in each nostril as directed (Patient taking differently: Place 1 spray into both nostrils as needed. Use in each nostril as directed)  ? cholecalciferol (VITAMIN D3) 25 MCG (1000 UNIT) tablet Take 1,000 Units by mouth daily.  ? DULoxetine (CYMBALTA) 60 MG capsule TAKE 1 CAPSULE BY MOUTH AT BEDTIME.  ? ELIQUIS 5 MG TABS tablet TAKE 1 TABLET BY MOUTH TWICE A DAY  ? furosemide (LASIX) 20 MG tablet TAKE 1 TABLET BY MOUTH DAILY AS NEEDED. TAKE WITH POTASSIUM.  ? gabapentin (NEURONTIN) 300 MG capsule TAKE 2 CAPSULES BY MOUTH AT BEDTIME  ? levothyroxine (SYNTHROID) 100 MCG tablet TAKE 1 TABLET EVERY DAY ON EMPTY STOMACHWITH A GLASS OF WATER AT LEAST 30-60 MINBEFORE BREAKFAST  ? lovastatin (MEVACOR) 40 MG tablet TAKE 1 TABLET BY MOUTH DAILY  ? metoprolol tartrate 75 MG TABS Take 75 mg by mouth 2 (two) times daily.  ? Multiple Vitamins-Minerals (PRESERVISION AREDS 2+MULTI VIT PO) Take by  mouth.  ? omeprazole (PRILOSEC) 20 MG capsule Take 1 capsule (20 mg total) by mouth daily.  ? Polyethyl Glycol-Propyl Glycol (SYSTANE OP) Place 1 drop into both eyes daily as needed (for dry eyes).  ? polyethylene

## 2022-01-14 ENCOUNTER — Ambulatory Visit: Payer: PPO | Admitting: Podiatry

## 2022-01-14 DIAGNOSIS — H353221 Exudative age-related macular degeneration, left eye, with active choroidal neovascularization: Secondary | ICD-10-CM | POA: Diagnosis not present

## 2022-01-16 ENCOUNTER — Encounter: Payer: Self-pay | Admitting: Internal Medicine

## 2022-01-16 NOTE — Assessment & Plan Note (Signed)
On metoprolol and lasix.  Blood pressure as outlined.  Continue current medication regimen. Hold on making changes.   Follow pressures.  Follow metabolic panel.  ?

## 2022-01-16 NOTE — Assessment & Plan Note (Signed)
On thyroid replacement.  Follow tsh.  

## 2022-01-16 NOTE — Assessment & Plan Note (Signed)
Mammogram 12/21/20 - [facility] I.  Had CT scan as outlined.  Changes as outlined:  enlarging partially calcified soft tissue mass in the left breast.  Per report - has enlarged over the past 2 years and is now more infiltrative in appearance in direct contact with the underlying left pectoralis major musculature. Findings  - highly suspicious for locally recurrent breast cancer.  Just evaluated by Dr Bary Castilla.  S/p biopsy - fat necrosis.  F/u mammogram 01/10/22 - Birads I.  ?

## 2022-01-16 NOTE — Assessment & Plan Note (Signed)
S/p pacemaker placement.  On eliquis and metoprolol.  Overall appears to be stable. 

## 2022-01-16 NOTE — Assessment & Plan Note (Signed)
Has seen pulmonary.  With minimal cough.  Overall improved from previous.  Planning for f/u CT chest.  Discussed need for using flutter valve.  mucinex as directed.  Albuterol if needed.  Follow closely.  Call with update.  ?

## 2022-01-16 NOTE — Assessment & Plan Note (Signed)
Has seen pulmonary.  With minimal cough.  Overall improved from previous.  Planning for f/u CT chest.  Discussed need for using flutter valve.  mucinex as directed.  Albuterol if needed.  Follow closely.  Call with update. ?

## 2022-01-16 NOTE — Assessment & Plan Note (Signed)
On lovastatin.  Continue diet and exercise.  Follow fasting profile and liver panel.  

## 2022-01-16 NOTE — Assessment & Plan Note (Signed)
Upper symptoms appear to be controlled on omeprazole.  

## 2022-01-16 NOTE — Assessment & Plan Note (Signed)
Low carb diet and exercise.  Follow met b and a1c.   ?

## 2022-01-16 NOTE — Assessment & Plan Note (Signed)
Plans to start sleeping in her bed and using more regularly  Follow.  ?

## 2022-01-16 NOTE — Assessment & Plan Note (Signed)
On lovastatin 

## 2022-01-16 NOTE — Assessment & Plan Note (Signed)
Increased stress - coping with her dog's passing. On cymbalta.  Follow.  ?

## 2022-01-16 NOTE — Assessment & Plan Note (Signed)
Follow cbc.  

## 2022-01-16 NOTE — Assessment & Plan Note (Signed)
Seeing Dr Gollan and EP.  On metoprolol 75mg bid.  Follow.  

## 2022-01-18 ENCOUNTER — Ambulatory Visit (INDEPENDENT_AMBULATORY_CARE_PROVIDER_SITE_OTHER): Payer: PPO

## 2022-01-18 DIAGNOSIS — I495 Sick sinus syndrome: Secondary | ICD-10-CM

## 2022-01-20 DIAGNOSIS — Z853 Personal history of malignant neoplasm of breast: Secondary | ICD-10-CM | POA: Diagnosis not present

## 2022-01-20 LAB — CUP PACEART REMOTE DEVICE CHECK
Battery Remaining Longevity: 88 mo
Battery Remaining Percentage: 82 %
Battery Voltage: 3.01 V
Brady Statistic AP VP Percent: 3 %
Brady Statistic AP VS Percent: 86 %
Brady Statistic AS VP Percent: 1 %
Brady Statistic AS VS Percent: 7.9 %
Brady Statistic RA Percent Paced: 82 %
Brady Statistic RV Percent Paced: 3.1 %
Date Time Interrogation Session: 20230427072943
Implantable Lead Implant Date: 20210416
Implantable Lead Implant Date: 20210416
Implantable Lead Location: 753859
Implantable Lead Location: 753860
Implantable Pulse Generator Implant Date: 20210416
Lead Channel Impedance Value: 360 Ohm
Lead Channel Impedance Value: 490 Ohm
Lead Channel Pacing Threshold Amplitude: 0.75 V
Lead Channel Pacing Threshold Amplitude: 1 V
Lead Channel Pacing Threshold Pulse Width: 0.5 ms
Lead Channel Pacing Threshold Pulse Width: 0.5 ms
Lead Channel Sensing Intrinsic Amplitude: 1.3 mV
Lead Channel Sensing Intrinsic Amplitude: 11 mV
Lead Channel Setting Pacing Amplitude: 2 V
Lead Channel Setting Pacing Amplitude: 2.5 V
Lead Channel Setting Pacing Pulse Width: 0.5 ms
Lead Channel Setting Sensing Sensitivity: 2 mV
Pulse Gen Model: 2272
Pulse Gen Serial Number: 3813093

## 2022-01-25 ENCOUNTER — Ambulatory Visit (INDEPENDENT_AMBULATORY_CARE_PROVIDER_SITE_OTHER): Payer: PPO | Admitting: Podiatry

## 2022-01-25 DIAGNOSIS — M2042 Other hammer toe(s) (acquired), left foot: Secondary | ICD-10-CM

## 2022-01-25 DIAGNOSIS — M2041 Other hammer toe(s) (acquired), right foot: Secondary | ICD-10-CM

## 2022-01-25 NOTE — H&P (View-Only) (Signed)
HPI: 88 y.o. female presenting today for follow-up evaluation regarding symptomatic calluses and hammertoes to the bilateral feet.  Patient states that the hammertoe pain has only increased since last visit.  She would like to proceed with correction of the hammertoe deformities.  Patient does have history of hammertoe correction repair to the right foot several years prior.  Patient states that slowly over time she has developed increased pain and tenderness to the hammertoe areas.  The last few visits here in the office that we have discussed surgery for correction of the hammertoes.  Past Medical History:  Diagnosis Date   Allergy    Anxiety    Arthritis    Atypical chest pain    a. 10/2018 MV: small, fixed apical defect possibly 2/2 attenuation artifact. No ischemia.  EF 68%.   Clotting disorder ([place])    Colitis    Depression    Diverticulitis 2013   Gastric ulcer    GERD (gastroesophageal reflux disease)    History of echocardiogram    a. 09/2019 Echo: EF 55-60%, mod LVH. Mildly dil LA. Triv MR/TR.   Hypercholesterolemia    Hypertension    Infiltrating lobular carcinoma of left breast 2011   T2,N0, ER: 90%; PR 0%; Her 2 neu not amplified. [facility]).   Melanoma ([place]) 1997   Melanoma in situ of upper extremity ([place]) 03/19/2011   Persistent atrial fibrillation ([place])    a. CHADS2VASc => 4 (HTN, age x 2, female)   Personal history of radiation therapy 2011   BREAST CA   Seroma    HISTORY OF LFT BREAST   Sleep apnea    Thyroid cancer ([place]) 1992     Past Surgical History:  Procedure Laterality Date   ABDOMINAL HYSTERECTOMY  1973   partial   BREAST BIOPSY Left 02-13-13   BENIGN BREAST TISSUE WITH CHANGES CONSISTENT WITH FAT NECROSIS   BREAST BIOPSY Left 01/21/2015   bx done in [facility] 11:00 left 6-8cmfn   BREAST EXCISIONAL BIOPSY Left 1995   neg   BREAST EXCISIONAL BIOPSY Left 2011   Breast cancer radiation   BREAST LUMPECTOMY Left 2011   BREAST CA    CARDIAC CATHETERIZATION     CHOLECYSTECTOMY     COLONOSCOPY  2013   COLONOSCOPY WITH PROPOFOL N/A 06/09/2021   Procedure: COLONOSCOPY WITH PROPOFOL;  Surgeon: Jonathon Bellows, MD;  Location: [facility] ENDOSCOPY;  Service: Gastroenterology;  Laterality: N/A;   MELANOMA EXCISION     RT UPPER ARM   PACEMAKER IMPLANT N/A 01/10/2020   Procedure: PACEMAKER IMPLANT;  Surgeon: Constance Haw, MD;  Location: [place] CV LAB;  Service: Cardiovascular;  Laterality: N/A;   PARTIAL HYSTERECTOMY     bleeding, ovaries in place.     THYROID SURGERY  1992   FOR THYROID CANCER   TONSILLECTOMY     Allergies  Allergen Reactions   Atorvastatin Other (See Comments)    Other reaction(s): Other (See Comments) STIFFNESS AND SORE STIFFNESS AND SORE   Lipitor [Atorvastatin Calcium] Other (See Comments)    Stiffness & soreness   Penicillins Rash    REACTION: Unknown reaction     Objective: Physical Exam General: The patient is alert and oriented x3 in no acute distress.  Dermatology: Skin is cool, dry and supple bilateral lower extremities. Negative for open lesions or macerations.  Vascular: Palpable pedal pulses bilaterally. No edema or erythema noted. Capillary refill within normal limits.  Neurological: Epicritic and protective threshold grossly intact bilaterally.  Musculoskeletal Exam: All pedal and ankle joints range of motion within normal limits bilateral. Muscle strength 5/5 in all groups bilateral. Hammertoe contracture deformity noted to the second, third, and fourth digit of the right foot.  There is also hammertoe deformity to the fifth digit of the right foot but there is no pain associated to this area. Symptomatic hammertoe deformity also noted to the second digit of the left foot.  There is tenderness and pain with palpation and range of motion to the hammertoe.  There is also overlapping deformity of the second digit onto the hallux left.  Radiographic Exam: Hammertoe contracture  deformity noted to the interphalangeal joints and MPJ of the respective hammertoe digits mentioned on clinical musculoskeletal exam.  Degenerative changes noted to the IPJ's of the digits with hammertoe contracture.  The right foot actually has subluxation/dislocation of the fifth MTP joint of the right foot.  Clinically this is asymptomatic.  There is an orthopedic screw in the distal portion of the fifth metatarsal which appears stable and intact There is degenerative changes specifically to the second third and fourth digits of the right foot from prior hammertoe correction surgery.  No hardware noted to the second third and fourth rays.   Assessment: 1.  Recurrent symptomatic hammertoe deformity second third and fourth right.  2.  Symptomatic hammertoe deformity second left. 3.  Disarticulation fifth MTP right; asymptomatic 4.  Overlapping deformity of the second toe left foot overlapping the hallux   Plan of Care:  1. Patient evaluated. X-Rays reviewed.  2.  Again today we discussed the conservative versus surgical management of the presenting pathology.  Unfortunately the patient has tried multiple conservative treatment modalities and she is desperate and really wants to have them corrected for.  She has pain specifically localized in the toes despite different shoe gear and arch supports.  The patient opts for surgical management. All possible complications and details of the procedure were explained. All patient questions were answered. No guarantees were expressed or implied. 3. Authorization for surgery was initiated today. Surgery will consist of sequential hammertoe repair digits 2, 3, 4 right.  Second left.  This may include arthroplasty, capsulotomy, and Weil shortening osteotomies. 4.  Patient will need medical clearance from her PCP and cardiologist prior to surgery 5.  Return to clinic 1 week postop     Edrick Kins, DPM [facility]  Dr. Edrick Kins, DPM     [address], [place] [address]                Office [phone]  Fax [phone]

## 2022-01-25 NOTE — Progress Notes (Addendum)
HPI: 88 y.o. female presenting today for follow-up evaluation regarding symptomatic calluses and hammertoes to the bilateral feet.  Patient states that the hammertoe pain has only increased since last visit.  She would like to proceed with correction of the hammertoe deformities.  Patient does have history of hammertoe correction repair to the right foot several years prior.  Patient states that slowly over time she has developed increased pain and tenderness to the hammertoe areas.  The last few visits here in the office that we have discussed surgery for correction of the hammertoes.  Past Medical History:  Diagnosis Date   Allergy    Anxiety    Arthritis    Atypical chest pain    a. 10/2018 MV: small, fixed apical defect possibly 2/2 attenuation artifact. No ischemia.  EF 68%.   Clotting disorder ([place])    Colitis    Depression    Diverticulitis 2013   Gastric ulcer    GERD (gastroesophageal reflux disease)    History of echocardiogram    a. 09/2019 Echo: EF 55-60%, mod LVH. Mildly dil LA. Triv MR/TR.   Hypercholesterolemia    Hypertension    Infiltrating lobular carcinoma of left breast 2011   T2,N0, ER: 90%; PR 0%; Her 2 neu not amplified. [facility]).   Melanoma ([place]) 1997   Melanoma in situ of upper extremity ([place]) 03/19/2011   Persistent atrial fibrillation ([place])    a. CHADS2VASc => 4 (HTN, age x 2, female)   Personal history of radiation therapy 2011   BREAST CA   Seroma    HISTORY OF LFT BREAST   Sleep apnea    Thyroid cancer ([place]) 1992     Past Surgical History:  Procedure Laterality Date   ABDOMINAL HYSTERECTOMY  1973   partial   BREAST BIOPSY Left 02-13-13   BENIGN BREAST TISSUE WITH CHANGES CONSISTENT WITH FAT NECROSIS   BREAST BIOPSY Left 01/21/2015   bx done in [facility] 11:00 left 6-8cmfn   BREAST EXCISIONAL BIOPSY Left 1995   neg   BREAST EXCISIONAL BIOPSY Left 2011   Breast cancer radiation   BREAST LUMPECTOMY Left 2011   BREAST CA    CARDIAC CATHETERIZATION     CHOLECYSTECTOMY     COLONOSCOPY  2013   COLONOSCOPY WITH PROPOFOL N/A 06/09/2021   Procedure: COLONOSCOPY WITH PROPOFOL;  Surgeon: Jonathon Bellows, MD;  Location: [facility] ENDOSCOPY;  Service: Gastroenterology;  Laterality: N/A;   MELANOMA EXCISION     RT UPPER ARM   PACEMAKER IMPLANT N/A 01/10/2020   Procedure: PACEMAKER IMPLANT;  Surgeon: Constance Haw, MD;  Location: [place] CV LAB;  Service: Cardiovascular;  Laterality: N/A;   PARTIAL HYSTERECTOMY     bleeding, ovaries in place.     THYROID SURGERY  1992   FOR THYROID CANCER   TONSILLECTOMY     Allergies  Allergen Reactions   Atorvastatin Other (See Comments)    Other reaction(s): Other (See Comments) STIFFNESS AND SORE STIFFNESS AND SORE   Lipitor [Atorvastatin Calcium] Other (See Comments)    Stiffness & soreness   Penicillins Rash    REACTION: Unknown reaction     Objective: Physical Exam General: The patient is alert and oriented x3 in no acute distress.  Dermatology: Skin is cool, dry and supple bilateral lower extremities. Negative for open lesions or macerations.  Vascular: Palpable pedal pulses bilaterally. No edema or erythema noted. Capillary refill within normal limits.  Neurological: Epicritic and protective threshold grossly intact bilaterally.  Musculoskeletal Exam: All pedal and ankle joints range of motion within normal limits bilateral. Muscle strength 5/5 in all groups bilateral. Hammertoe contracture deformity noted to the second, third, and fourth digit of the right foot.  There is also hammertoe deformity to the fifth digit of the right foot but there is no pain associated to this area. Symptomatic hammertoe deformity also noted to the second digit of the left foot.  There is tenderness and pain with palpation and range of motion to the hammertoe.  There is also overlapping deformity of the second digit onto the hallux left.  Radiographic Exam: Hammertoe contracture  deformity noted to the interphalangeal joints and MPJ of the respective hammertoe digits mentioned on clinical musculoskeletal exam.  Degenerative changes noted to the IPJ's of the digits with hammertoe contracture.  The right foot actually has subluxation/dislocation of the fifth MTP joint of the right foot.  Clinically this is asymptomatic.  There is an orthopedic screw in the distal portion of the fifth metatarsal which appears stable and intact There is degenerative changes specifically to the second third and fourth digits of the right foot from prior hammertoe correction surgery.  No hardware noted to the second third and fourth rays.   Assessment: 1.  Recurrent symptomatic hammertoe deformity second third and fourth right.  2.  Symptomatic hammertoe deformity second left. 3.  Disarticulation fifth MTP right; asymptomatic 4.  Overlapping deformity of the second toe left foot overlapping the hallux   Plan of Care:  1. Patient evaluated. X-Rays reviewed.  2.  Again today we discussed the conservative versus surgical management of the presenting pathology.  Unfortunately the patient has tried multiple conservative treatment modalities and she is desperate and really wants to have them corrected for.  She has pain specifically localized in the toes despite different shoe gear and arch supports.  The patient opts for surgical management. All possible complications and details of the procedure were explained. All patient questions were answered. No guarantees were expressed or implied. 3. Authorization for surgery was initiated today. Surgery will consist of sequential hammertoe repair digits 2, 3, 4 right.  Second left.  This may include arthroplasty, capsulotomy, and Weil shortening osteotomies. 4.  Patient will need medical clearance from her PCP and cardiologist prior to surgery 5.  Return to clinic 1 week postop     Edrick Kins, DPM [facility]  Dr. Edrick Kins, DPM     [address], [place] [address]                Office [phone]  Fax [phone]

## 2022-01-28 ENCOUNTER — Encounter: Payer: Self-pay | Admitting: Podiatry

## 2022-01-28 ENCOUNTER — Telehealth: Payer: Self-pay

## 2022-01-28 ENCOUNTER — Telehealth: Payer: Self-pay | Admitting: Cardiovascular Disease

## 2022-01-28 NOTE — Telephone Encounter (Signed)
? ? ?  Patient Name: Kerri Mills  ?DOB: [birth year] ?MRN: [id number] ? ?Primary Cardiologist: Ida Rogue, MD ? ?Chart reviewed as part of pre-operative protocol coverage. Patient has upcoming sequential hammertoe repair. Per Dr. Rockey Situ, "Acceptable risk for foot surgery. We would recommend she hold Eliquis 2 days prior to procedure. No further cardiac testing needed. Would recommend she take metoprolol morning before the procedure in effort to limit recurrence of her tach arrhythmia/A-fib." ? ?I will route this recommendation to the requesting party via Epic fax function and remove from pre-op pool. ? ?Please call with questions. ? ?Darreld Mclean, PA-C ?01/28/2022, 3:17 PM ? ?

## 2022-01-28 NOTE — Telephone Encounter (Signed)
Received surgery paperwork from the [place] office. Left a message for Kerri Mills to call and scheduled surgery with Dr. Amalia Hailey ?

## 2022-01-28 NOTE — Telephone Encounter (Signed)
   Pre-operative Risk Assessment    Patient Name: Kerri Mills  DOB: 1930/12/17 MRN: [id number]{ [facility]CARE STAFF-IMPORTANT INSTRUCTIONS 1 Red and Blue Text will auto delete once note is signed or closed. 2 Press F2 to navigate through template.   3 On drop down lists, L click to select >> R click to activate next field 4 Reason for Visit format is IMPORTANT!!  See Directions on No. 2 below. 5 Please review chart to determine if there is already a clearance note open for this procedure!!  DO NOT duplicate if a note already exists!!        Request for Surgical Clearance{ 1. What type of surgery is being performed? Enter name of procedure below and number of teeth if dental extraction.    Procedure:   SEQUENTIAL HAMMERTOE REPAIR   2. When is this surgery scheduled? Press F2 to enter date below and place date in Reason for Visit (see directions below).  Date of Surgery:  Clearance 02/17/22                             For convenience, highlight and copy (CTL+C) the Clearance MM/DD/YY phrase above. Click here to go to Reason for Visit.  Paste (CTL+V) the date.  Merchandiser, retail.  Then click button underneath called Add Clearance MM/DD/YY as free text.       3. What is the name of the Surgeon, the Surgeon's Group or Practice, phone and fax number?  Press F2 and list below  Surgeon:  DR Daylene Katayama Surgeon's Group or Practice Name:  [facility] Phone number:  [phone] Fax number:  [phone]  4. What type of clearance is requested?  Medical or Cardiac Clearance only?  Pharmacy Clearance Only (Request is to hold medication only)?  Or Both?  Press F2 and select the clearance requested.  If both are needed, select both from the drop down list.      Type of Clearance Requested:   - Medical  AND PHARMACY IF NEEDED 5. What type of anesthesia will be used?  Press F2 and select the anesthesia to be used for the procedure.  GENERAL Type of Anesthesia:  General   6. Are there  any other requests or questions from the surgeon?     Additional requests/questions:    Signed, Eli Phillips   01/28/2022, 2:49 PM

## 2022-01-31 ENCOUNTER — Telehealth: Payer: Self-pay | Admitting: Urology

## 2022-01-31 NOTE — Telephone Encounter (Addendum)
DOS - 02/24/22  HAMMERTOE REPAIR 2-4 RIGHT , 2 LEFT --- [id number]  [facility] EFFECTIVE DATE - 09/26/21  RECEIVED FAX FROM [facility] STATING THAT CPT CODE 20037 HAS BEEN APPROVED, AUTH # [id number], GOOD FROM 02/17/22 - 05/18/22.  02/15/22 RECEIVED FAX FROM [facility] WITH UPDATED AUTH. AUTH # [id number], GOOD FROM 02/24/22 - 05/25/22

## 2022-02-01 ENCOUNTER — Ambulatory Visit (INDEPENDENT_AMBULATORY_CARE_PROVIDER_SITE_OTHER): Payer: PPO | Admitting: Internal Medicine

## 2022-02-01 ENCOUNTER — Encounter: Payer: Self-pay | Admitting: Internal Medicine

## 2022-02-01 DIAGNOSIS — D649 Anemia, unspecified: Secondary | ICD-10-CM

## 2022-02-01 DIAGNOSIS — R739 Hyperglycemia, unspecified: Secondary | ICD-10-CM

## 2022-02-01 DIAGNOSIS — I4891 Unspecified atrial fibrillation: Secondary | ICD-10-CM | POA: Diagnosis not present

## 2022-02-01 DIAGNOSIS — I495 Sick sinus syndrome: Secondary | ICD-10-CM | POA: Diagnosis not present

## 2022-02-01 DIAGNOSIS — R0602 Shortness of breath: Secondary | ICD-10-CM | POA: Diagnosis not present

## 2022-02-01 DIAGNOSIS — K219 Gastro-esophageal reflux disease without esophagitis: Secondary | ICD-10-CM

## 2022-02-01 DIAGNOSIS — F419 Anxiety disorder, unspecified: Secondary | ICD-10-CM

## 2022-02-01 DIAGNOSIS — I700 Atherosclerosis of aorta: Secondary | ICD-10-CM

## 2022-02-01 DIAGNOSIS — J479 Bronchiectasis, uncomplicated: Secondary | ICD-10-CM

## 2022-02-01 DIAGNOSIS — E7800 Pure hypercholesterolemia, unspecified: Secondary | ICD-10-CM

## 2022-02-01 DIAGNOSIS — E039 Hypothyroidism, unspecified: Secondary | ICD-10-CM | POA: Diagnosis not present

## 2022-02-01 DIAGNOSIS — G4733 Obstructive sleep apnea (adult) (pediatric): Secondary | ICD-10-CM | POA: Diagnosis not present

## 2022-02-01 DIAGNOSIS — I10 Essential (primary) hypertension: Secondary | ICD-10-CM | POA: Diagnosis not present

## 2022-02-01 NOTE — Progress Notes (Addendum)
Patient ID: Kerri Mills, female   DOB: 1931-08-19, 88 y.o.   MRN: [id number]   Subjective:    Patient ID: Kerri Mills, female    DOB: [birth year], 88 y.o.   MRN: [id number]  This visit occurred during the SARS-CoV-2 public health emergency.  Safety protocols were in place, including screening questions prior to the visit, additional usage of staff PPE, and extensive cleaning of exam room while observing appropriate contact time as indicated for disinfecting solutions.   Patient here for pre op evaluation.   Chief Complaint  Patient presents with   Follow-up    Follow up for pre-surgical clearance    .   HPI Seeing podiatry for symptomatic hammertoe deformity.  Planning for surgery and will need general anesthesia.  She reports she is doing relatively well.  Denies any chest pain.  No increased heart rate or palpitations.  Breathing overall stable.  Still reports some sob.  She is using her flutter valve.  Has known bronchiectasis.  Followed by Dr Mortimer Fries.  Cough appears to be better.  No acid reflux reported.  No abdominal pain.  Bowels moving.  Some swelling - lower extremities this past weekend.  Took a dose of lasix.  Using cpap on a more regular basis now.     Past Medical History:  Diagnosis Date   Allergy    Anxiety    Arthritis    Atypical chest pain    a. 10/2018 MV: small, fixed apical defect possibly 2/2 attenuation artifact. No ischemia.  EF 68%.   Clotting disorder ([place])    Colitis    Depression    Diverticulitis 2013   Gastric ulcer    GERD (gastroesophageal reflux disease)    History of echocardiogram    a. 09/2019 Echo: EF 55-60%, mod LVH. Mildly dil LA. Triv MR/TR.   Hypercholesterolemia    Hypertension    Infiltrating lobular carcinoma of left breast 2011   T2,N0, ER: 90%; PR 0%; Her 2 neu not amplified. [facility]).   Melanoma ([place]) 1997   Melanoma in situ of upper extremity ([place]) 03/19/2011   Persistent atrial fibrillation ([place])    a. CHADS2VASc => 4  (HTN, age x 2, female)   Personal history of radiation therapy 2011   BREAST CA   Seroma    HISTORY OF LFT BREAST   Sleep apnea    Thyroid cancer ([place]) 1992   Past Surgical History:  Procedure Laterality Date   ABDOMINAL HYSTERECTOMY  1973   partial   BREAST BIOPSY Left 02-13-13   BENIGN BREAST TISSUE WITH CHANGES CONSISTENT WITH FAT NECROSIS   BREAST BIOPSY Left 01/21/2015   bx done in [facility] office 11:00 left 6-8cmfn   BREAST EXCISIONAL BIOPSY Left 1995   neg   BREAST EXCISIONAL BIOPSY Left 2011   Breast cancer radiation   BREAST LUMPECTOMY Left 2011   BREAST CA   CARDIAC CATHETERIZATION     CHOLECYSTECTOMY     COLONOSCOPY  2013   COLONOSCOPY WITH PROPOFOL N/A 06/09/2021   Procedure: COLONOSCOPY WITH PROPOFOL;  Surgeon: Jonathon Bellows, MD;  Location: [facility] ENDOSCOPY;  Service: Gastroenterology;  Laterality: N/A;   MELANOMA EXCISION     RT UPPER ARM   PACEMAKER IMPLANT N/A 01/10/2020   Procedure: PACEMAKER IMPLANT;  Surgeon: Constance Haw, MD;  Location: [place] [facility] LAB;  Service: Cardiovascular;  Laterality: N/A;   PARTIAL HYSTERECTOMY     bleeding, ovaries in place.     THYROID SURGERY  1992   FOR THYROID CANCER   TONSILLECTOMY     Family History  Problem Relation Age of Onset   Heart disease Mother    Cancer Brother        lung    Cancer Sister        breast   Breast cancer Neg Hx    Social History   Socioeconomic History   Marital status: Widowed    Spouse name: Not on file   Number of children: Not on file   Years of education: Not on file   Highest education level: Not on file  Occupational History   Not on file  Tobacco Use   Smoking status: Never   Smokeless tobacco: Never   Tobacco comments:    never  Substance and Sexual Activity   Alcohol use: Yes    Comment: once in a while   Drug use: No   Sexual activity: Never  Other Topics Concern   Not on file  Social History Narrative   Independent and baseline. Lives by herself   Social  Determinants of Health   Financial Resource Strain: Low Risk    Difficulty of Paying Living Expenses: Not hard at all  Food Insecurity: Not on file  Transportation Needs: Not on file  Physical Activity: Not on file  Stress: Stress Concern Present   Feeling of Stress : To some extent  Social Connections: Not on file     Review of Systems  Constitutional:  Negative for appetite change and unexpected weight change.  HENT:  Negative for congestion and sinus pressure.   Respiratory:  Negative for chest tightness.        Some sob as outlined.  Cough appears to be better.   Cardiovascular:  Negative for chest pain and palpitations.       Leg swelling as outlined.  Improved.   Gastrointestinal:  Negative for abdominal pain, diarrhea, nausea and vomiting.  Genitourinary:  Negative for difficulty urinating and dysuria.  Musculoskeletal:  Negative for joint swelling and myalgias.  Skin:  Negative for color change and rash.  Neurological:  Negative for dizziness, light-headedness and headaches.  Psychiatric/Behavioral:  Negative for agitation and dysphoric mood.       Objective:     BP 118/74 (BP Location: Left Arm, Patient Position: Sitting, Cuff Size: Small)   Pulse 61   Temp 98 F (36.7 C) (Temporal)   Resp 13   Ht 5' 5"  (1.651 m)   Wt 171 lb (77.6 kg)   SpO2 98%   BMI 28.46 kg/m  Wt Readings from Last 3 Encounters:  02/01/22 171 lb (77.6 kg)  01/12/22 171 lb (77.6 kg)  12/29/21 169 lb 6.4 oz (76.8 kg)    Physical Exam Vitals reviewed.  Constitutional:      General: She is not in acute distress.    Appearance: Normal appearance.  HENT:     Head: Normocephalic and atraumatic.     Right Ear: External ear normal.     Left Ear: External ear normal.  Eyes:     General: No scleral icterus.       Right eye: No discharge.        Left eye: No discharge.     Conjunctiva/sclera: Conjunctivae normal.  Neck:     Thyroid: No thyromegaly.  Cardiovascular:     Rate and Rhythm:  Normal rate.     Comments: Rate controlled.  Pulmonary:     Effort: No respiratory distress.  Breath sounds: Normal breath sounds. No wheezing.  Abdominal:     General: Bowel sounds are normal.     Palpations: Abdomen is soft.     Tenderness: There is no abdominal tenderness.  Musculoskeletal:        General: No tenderness.     Cervical back: Neck supple. No tenderness.     Comments: No increased pedal and lower extremity swelling.  Stable.    Lymphadenopathy:     Cervical: No cervical adenopathy.  Skin:    Findings: No erythema or lesion.  Neurological:     Mental Status: She is alert.  Psychiatric:        Mood and Affect: Mood normal.        Behavior: Behavior normal.     Outpatient Encounter Medications as of 02/01/2022  Medication Sig   acetaminophen (TYLENOL) 650 MG CR tablet Take 650 mg by mouth every 8 (eight) hours as needed for pain.   azelastine (ASTELIN) 0.1 % nasal spray Place 1 spray into both nostrils 2 (two) times daily. Use in each nostril as directed (Patient taking differently: Place 1 spray into both nostrils as needed. Use in each nostril as directed)   cholecalciferol (VITAMIN D3) 25 MCG (1000 UNIT) tablet Take 1,000 Units by mouth daily.   DULoxetine (CYMBALTA) 60 MG capsule TAKE 1 CAPSULE BY MOUTH AT BEDTIME.   ELIQUIS 5 MG TABS tablet TAKE 1 TABLET BY MOUTH TWICE A DAY   furosemide (LASIX) 20 MG tablet TAKE 1 TABLET BY MOUTH DAILY AS NEEDED. TAKE WITH POTASSIUM.   gabapentin (NEURONTIN) 300 MG capsule TAKE 2 CAPSULES BY MOUTH AT BEDTIME   levothyroxine (SYNTHROID) 100 MCG tablet TAKE 1 TABLET EVERY DAY ON EMPTY STOMACHWITH A GLASS OF WATER AT LEAST 30-60 MINBEFORE BREAKFAST   lovastatin (MEVACOR) 40 MG tablet TAKE 1 TABLET BY MOUTH DAILY   metoprolol tartrate 75 MG TABS Take 75 mg by mouth 2 (two) times daily.   Multiple Vitamins-Minerals (PRESERVISION AREDS 2+MULTI VIT PO) Take by mouth.   omeprazole (PRILOSEC) 20 MG capsule Take 1 capsule (20 mg total)  by mouth daily.   Polyethyl Glycol-Propyl Glycol (SYSTANE OP) Place 1 drop into both eyes daily as needed (for dry eyes).   polyethylene glycol powder (GLYCOLAX/MIRALAX) 17 GM/SCOOP powder Take 17 g by mouth daily.   potassium chloride (KLOR-CON) 10 MEQ tablet TAKE 2 TABLETS BY MOUTH DAILY AS NEEDED.WITH FUROSEMIDE.   Probiotic Product (ALIGN) 4 MG CAPS One capsule daily while on antibiotic and for two weeks after complete antibiotic   White Petrolatum-Mineral Oil (GENTEAL TEARS NIGHT-TIME OP) Place 1 application into both eyes at bedtime. Night time ointment 3.5g   No facility-administered encounter medications on file as of 02/01/2022.     Lab Results  Component Value Date   WBC 6.4 11/29/2021   HGB 13.4 11/29/2021   HCT 40.5 11/29/2021   PLT 183.0 11/29/2021   GLUCOSE 116 (H) 11/29/2021   CHOL 156 11/29/2021   TRIG 172.0 (H) 11/29/2021   HDL 36.80 (L) 11/29/2021   LDLDIRECT 90.0 03/15/2021   LDLCALC 85 11/29/2021   ALT 11 11/29/2021   AST 15 11/29/2021   NA 141 11/29/2021   K 4.6 11/29/2021   CL 104 11/29/2021   CREATININE 0.84 11/29/2021   BUN 15 11/29/2021   CO2 31 11/29/2021   TSH 1.39 05/12/2021   INR 1.5 (H) 11/21/2018   HGBA1C 6.4 11/29/2021    MM 3D SCREEN BREAST BILATERAL  Result Date: 01/10/2022 CLINICAL DATA:  Screening. EXAM: DIGITAL SCREENING BILATERAL MAMMOGRAM WITH TOMOSYNTHESIS AND CAD TECHNIQUE: Bilateral screening digital craniocaudal and mediolateral oblique mammograms were obtained. Bilateral screening digital breast tomosynthesis was performed. The images were evaluated with computer-aided detection. COMPARISON:  Previous exam(s). ACR Breast Density Category b: There are scattered areas of fibroglandular density. FINDINGS: There are no findings suspicious for malignancy. IMPRESSION: No mammographic evidence of malignancy. A result letter of this screening mammogram will be mailed directly to the patient. RECOMMENDATION: Screening mammogram in one year.  (Code:SM-B-01Y) BI-RADS CATEGORY  1: Negative. Electronically Signed   By: Nolon Nations M.D.   On: 01/10/2022 13:09       Assessment & Plan:   Problem List Items Addressed This Visit     Anemia    Follow cbc.        Anxiety    Continues on cymbalta. Stable.        Aortic atherosclerosis (HCC)    On lovastatin.        Atrial fibrillation [facility])    S/p pacemaker placement.  On eliquis and metoprolol.  Overall appears to be stable.       Bronchiectasis without complication (HCC)    Continue flutter valve.  Still with sob.  Cough is better.  Given pulmonary history and upcoming surgery, will have Dr Mortimer Fries evaluate - pre op clearance/recommendations        Essential hypertension    On metoprolol and lasix prn.   Blood pressure as outlined.  Continue current medication regimen. Hold on making changes.   Follow pressures.  Follow metabolic panel.        GERD    Upper symptoms appear to be controlled on omeprazole.        Hypercholesterolemia    On lovastatin.  Continue diet and exercise.  Follow fasting profile and liver panel.        Hyperglycemia    Low carb diet and exercise.  Follow met b and a1c.         Hypothyroid    On thyroid replacement.  Follow tsh.        Obstructive sleep apnea    Using cpap more now.  Discussed need to take cpap with her to hospital.  Follow.  Use in peri op period and continue to use regularly.         SOB (shortness of breath)    Cough appears to be doing better.  Still with sob.  Continue flutter valve.  Continue cpap - using more on a regular basis now.  Continue current inhalers.  See pulmonary as outlined.         Tachy-brady syndrome (HCC)    Seeing Dr Rockey Situ and EP.  On metoprolol 54m bid.  Follow.         Addendum:  Pre op evaluation/recommendations: see attached notes for cardiology and pulmonary recommendations.  Will need close intra op and post op monitoring of heart rate and blood pressures to avoid  extremes.  See cardiology note with instructions to hold eliquis prior to procedure.  Have discussed with pt regarding taking metoprolol the morning before her procedure in effort to limit recurrence of her tach arrhythmia/afib.  Also, discussed the need to use cpap in the peri op period.  See above.   CEinar Pheasant MD

## 2022-02-03 NOTE — Progress Notes (Signed)
Remote pacemaker transmission.   

## 2022-02-04 ENCOUNTER — Telehealth: Payer: Self-pay | Admitting: Internal Medicine

## 2022-02-04 ENCOUNTER — Encounter: Payer: Self-pay | Admitting: Internal Medicine

## 2022-02-04 NOTE — Assessment & Plan Note (Signed)
Continue flutter valve.  Still with sob.  Cough is better.  Given pulmonary history and upcoming surgery, will have Dr Mortimer Fries evaluate - pre op clearance/recommendations  ?

## 2022-02-04 NOTE — Assessment & Plan Note (Signed)
Low carb diet and exercise.  Follow met b and a1c.   ?

## 2022-02-04 NOTE — Assessment & Plan Note (Signed)
On lovastatin.  Continue diet and exercise.  Follow fasting profile and liver panel.  

## 2022-02-04 NOTE — Assessment & Plan Note (Signed)
Seeing Dr Gollan and EP.  On metoprolol 75mg bid.  Follow.  

## 2022-02-04 NOTE — Assessment & Plan Note (Signed)
Continues on cymbalta. Stable.  

## 2022-02-04 NOTE — Assessment & Plan Note (Signed)
Using cpap more now.  Discussed need to take cpap with her to hospital.  Follow.  Use in peri op period and continue to use regularly.   ?

## 2022-02-04 NOTE — Assessment & Plan Note (Signed)
On thyroid replacement.  Follow tsh.  

## 2022-02-04 NOTE — Assessment & Plan Note (Signed)
Follow cbc.  

## 2022-02-04 NOTE — Assessment & Plan Note (Signed)
On lovastatin 

## 2022-02-04 NOTE — Assessment & Plan Note (Signed)
On metoprolol and lasix prn.   Blood pressure as outlined.  Continue current medication regimen. Hold on making changes.   Follow pressures.  Follow metabolic panel.  

## 2022-02-04 NOTE — Assessment & Plan Note (Signed)
Upper symptoms appear to be controlled on omeprazole.  

## 2022-02-04 NOTE — Assessment & Plan Note (Signed)
Cough appears to be doing better.  Still with sob.  Continue flutter valve.  Continue cpap - using more on a regular basis now.  Continue current inhalers.  See pulmonary as outlined.   ?

## 2022-02-04 NOTE — Telephone Encounter (Signed)
ATC Sarah-on hold for >73mn.  ?Will call back 02/07/2022 ?

## 2022-02-04 NOTE — Assessment & Plan Note (Signed)
S/p pacemaker placement.  On eliquis and metoprolol.  Overall appears to be stable. 

## 2022-02-07 ENCOUNTER — Telehealth: Payer: Self-pay

## 2022-02-07 NOTE — Telephone Encounter (Signed)
I spoke with Margie from Dr. Zoila Shutter office she was going to ask if he would be able to fit patient in prior to her surgery 5/25 or if he could clear her based of last OV 12/29/21.  ?

## 2022-02-07 NOTE — Telephone Encounter (Signed)
Spoke to [place] with Dr. Bary Leriche office.  ?Judson Roch stated that patient is scheduled for foot surgery 02/17/2022.  ?Dr. Nicki Reaper is requesting surgical clearance prior. ?Patient last seen 12/29/21. ? ?Dr. Mortimer Fries, please advise. Thanks ?

## 2022-02-08 ENCOUNTER — Ambulatory Visit (INDEPENDENT_AMBULATORY_CARE_PROVIDER_SITE_OTHER): Payer: PPO

## 2022-02-08 VITALS — Ht 65.0 in | Wt 171.0 lb

## 2022-02-08 DIAGNOSIS — Z0000 Encounter for general adult medical examination without abnormal findings: Secondary | ICD-10-CM | POA: Diagnosis not present

## 2022-02-08 NOTE — Telephone Encounter (Signed)
Hold note in box per Dr Nicki Reaper ?

## 2022-02-08 NOTE — Progress Notes (Signed)
Subjective:   Kerri Mills is a 88 y.o. female who presents for Medicare Annual (Subsequent) preventive examination.  Review of Systems    No ROS.  Medicare Wellness Virtual Visit.  Visual/audio telehealth visit, UTA vital signs.   See social history for additional risk factors.   Cardiac Risk Factors include: advanced age (>70men, >34 women)     Objective:    Today's Vitals   02/08/22 0917  Weight: 171 lb (77.6 kg)  Height: 5\' 5"  (1.651 m)   Body mass index is 28.46 kg/m.     06/09/2021   12:40 PM 07/27/2020    9:43 AM 01/10/2020    6:00 PM 01/08/2020    7:56 PM 01/08/2020    2:24 PM 10/20/2019    7:25 PM 07/25/2019   12:14 PM  Advanced Directives  Does Patient Have a Medical Advance Directive? Yes Yes Yes Yes No Yes Yes  Type of Estate agent of [place];Living will Healthcare Power of [place];Living will Healthcare Power of [place];Living will Healthcare Power of [place];Living will  Living will Healthcare Power of [place];Living will  Does patient want to make changes to medical advance directive?  No - Patient declined No - Patient declined No - Patient declined  No - Patient declined No - Patient declined  Copy of Healthcare Power of Attorney in Chart? No - copy requested Yes - validated most recent copy scanned in chart (See row information)     Yes - validated most recent copy scanned in chart (See row information)  Would patient like information on creating a medical advance directive?   No - Patient declined No - Patient declined No - Guardian declined No - Patient declined     Current Medications (verified) Outpatient Encounter Medications as of 02/08/2022  Medication Sig   acetaminophen (TYLENOL) 650 MG CR tablet Take 650 mg by mouth every 8 (eight) hours as needed for pain.   azelastine (ASTELIN) 0.1 % nasal spray Place 1 spray into both nostrils 2 (two) times daily. Use in each nostril as directed (Patient taking differently: Place 1 spray  into both nostrils as needed. Use in each nostril as directed)   cholecalciferol (VITAMIN D3) 25 MCG (1000 UNIT) tablet Take 1,000 Units by mouth daily.   DULoxetine (CYMBALTA) 60 MG capsule TAKE 1 CAPSULE BY MOUTH AT BEDTIME.   ELIQUIS 5 MG TABS tablet TAKE 1 TABLET BY MOUTH TWICE A DAY   furosemide (LASIX) 20 MG tablet TAKE 1 TABLET BY MOUTH DAILY AS NEEDED. TAKE WITH POTASSIUM.   gabapentin (NEURONTIN) 300 MG capsule TAKE 2 CAPSULES BY MOUTH AT BEDTIME   levothyroxine (SYNTHROID) 100 MCG tablet TAKE 1 TABLET EVERY DAY ON EMPTY STOMACHWITH A GLASS OF WATER AT LEAST 30-60 MINBEFORE BREAKFAST   lovastatin (MEVACOR) 40 MG tablet TAKE 1 TABLET BY MOUTH DAILY   metoprolol tartrate 75 MG TABS Take 75 mg by mouth 2 (two) times daily.   Multiple Vitamins-Minerals (PRESERVISION AREDS 2+MULTI VIT PO) Take by mouth.   omeprazole (PRILOSEC) 20 MG capsule Take 1 capsule (20 mg total) by mouth daily.   Polyethyl Glycol-Propyl Glycol (SYSTANE OP) Place 1 drop into both eyes daily as needed (for dry eyes).   polyethylene glycol powder (GLYCOLAX/MIRALAX) 17 GM/SCOOP powder Take 17 g by mouth daily.   potassium chloride (KLOR-CON) 10 MEQ tablet TAKE 2 TABLETS BY MOUTH DAILY AS NEEDED.WITH FUROSEMIDE.   Probiotic Product (ALIGN) 4 MG CAPS One capsule daily while on antibiotic and for two weeks after  complete antibiotic   White Petrolatum-Mineral Oil (GENTEAL TEARS NIGHT-TIME OP) Place 1 application into both eyes at bedtime. Night time ointment 3.5g   No facility-administered encounter medications on file as of 02/08/2022.    Allergies (verified) Atorvastatin, Lipitor [atorvastatin calcium], and Penicillins   History: Past Medical History:  Diagnosis Date   Allergy    Anxiety    Arthritis    Atypical chest pain    a. 10/2018 MV: small, fixed apical defect possibly 2/2 attenuation artifact. No ischemia.  EF 68%.   Clotting disorder (HCC)    Colitis    Depression    Diverticulitis 2013   Gastric ulcer     GERD (gastroesophageal reflux disease)    History of echocardiogram    a. 09/2019 Echo: EF 55-60%, mod LVH. Mildly dil LA. Triv MR/TR.   Hypercholesterolemia    Hypertension    Infiltrating lobular carcinoma of left breast 2011   T2,N0, ER: 90%; PR 0%; Her 2 neu not amplified. [facility]).   Melanoma (HCC) 1997   Melanoma in situ of upper extremity (HCC) 03/19/2011   Persistent atrial fibrillation (HCC)    a. CHADS2VASc => 4 (HTN, age x 2, female)   Personal history of radiation therapy 2011   BREAST CA   Seroma    HISTORY OF LFT BREAST   Sleep apnea    Thyroid cancer (HCC) 1992   Past Surgical History:  Procedure Laterality Date   ABDOMINAL HYSTERECTOMY  1973   partial   BREAST BIOPSY Left 02-13-13   BENIGN BREAST TISSUE WITH CHANGES CONSISTENT WITH FAT NECROSIS   BREAST BIOPSY Left 01/21/2015   bx done in [facility] 11:00 left 6-8cmfn   BREAST EXCISIONAL BIOPSY Left 1995   neg   BREAST EXCISIONAL BIOPSY Left 2011   Breast cancer radiation   BREAST LUMPECTOMY Left 2011   BREAST CA   CARDIAC CATHETERIZATION     CHOLECYSTECTOMY     COLONOSCOPY  2013   COLONOSCOPY WITH PROPOFOL N/A 06/09/2021   Procedure: COLONOSCOPY WITH PROPOFOL;  Surgeon: Wyline Mood, MD;  Location: [facility] ENDOSCOPY;  Service: Gastroenterology;  Laterality: N/A;   MELANOMA EXCISION     RT UPPER ARM   PACEMAKER IMPLANT N/A 01/10/2020   Procedure: PACEMAKER IMPLANT;  Surgeon: Regan Lemming, MD;  Location: [facility] INVASIVE CV LAB;  Service: Cardiovascular;  Laterality: N/A;   PARTIAL HYSTERECTOMY     bleeding, ovaries in place.     THYROID SURGERY  1992   FOR THYROID CANCER   TONSILLECTOMY     Family History  Problem Relation Age of Onset   Heart disease Mother    Cancer Brother        lung    Cancer Sister        breast   Breast cancer Neg Hx    Social History   Socioeconomic History   Marital status: Widowed    Spouse name: Not on file   Number of children: Not on file   Years  of education: Not on file   Highest education level: Not on file  Occupational History   Not on file  Tobacco Use   Smoking status: Never   Smokeless tobacco: Never   Tobacco comments:    never  Substance and Sexual Activity   Alcohol use: Yes    Comment: once in a while   Drug use: No   Sexual activity: Never  Other Topics Concern   Not on file  Social History Narrative  Independent and baseline. Lives by herself   Social Determinants of Health   Financial Resource Strain: Low Risk    Difficulty of Paying Living Expenses: Not hard at all  Food Insecurity: No Food Insecurity   Worried About Programme researcher, broadcasting/film/video in the Last Year: Never true   Barista in the Last Year: Never true  Transportation Needs: No Transportation Needs   Lack of Transportation (Medical): No   Lack of Transportation (Non-Medical): No  Physical Activity: Sufficiently Active   Days of Exercise per Week: 4 days   Minutes of Exercise per Session: 40 min  Stress: No Stress Concern Present   Feeling of Stress : Only a little  Social Connections: Unknown   Frequency of Communication with Friends and Family: More than three times a week   Frequency of Social Gatherings with Friends and Family: More than three times a week   Attends Religious Services: Not on file   Active Member of Clubs or Organizations: Not on file   Attends Banker Meetings: Not on file   Marital Status: Widowed    Tobacco Counseling Counseling given: Not Answered Tobacco comments: never   Clinical Intake:  Pre-visit preparation completed: Yes        Diabetes: No  How often do you need to have someone help you when you read instructions, pamphlets, or other written materials from your doctor or pharmacy?: 1 - Never  Interpreter Needed?: No      Activities of Daily Living    02/08/2022    9:28 AM  In your present state of health, do you have any difficulty performing the following activities:   Hearing? 0  Vision? 0  Difficulty concentrating or making decisions? 0  Walking or climbing stairs? 0  Comment Paces self when walking  Dressing or bathing? 0  Doing errands, shopping? 0  Preparing Food and eating ? N  Using the Toilet? N  In the past six months, have you accidently leaked urine? Y  Comment Managed with daily pad  Do you have problems with loss of bowel control? N  Managing your Medications? N  Managing your Finances? Y  Comment Daughter assist  Housekeeping or managing your Housekeeping? Y  Comment Maid assist    Patient Care Team: Dale Waldron, MD as PCP - General (Internal Medicine) Antonieta Iba, MD as PCP - Cardiology (Cardiology) Regan Lemming, MD as PCP - Electrophysiology (Cardiology) Lemar Livings, Merrily Pew, MD (General Surgery) Virl Cagey, MD (Internal Medicine)  Indicate any recent Medical Services you may have received from other than [facility] providers in the past year (date may be approximate).     Assessment:   This is a routine wellness examination for [place].  Virtual Visit via Telephone Note  I connected with  Kerri Mills on 02/08/22 at  9:15 AM EDT by telephone and verified that I am speaking with the correct person using two identifiers.  Persons participating in the virtual visit: patient/Nurse Health Advisor   I discussed the limitations of performing an evaluation and management service by telehealth. The patient expressed understanding and agreed to proceed. We continued and completed visit with audio only.Some vital signs may be absent or patient reported.   Hearing/Vision screen Hearing Screening - Comments:: Patient is able to hear conversational tones without difficulty.  No issues reported. Vision Screening - Comments:: Followed by [facility] Wears corrective lenses  Wet macular degeneraton; receives injections  Cataract extraction, bilateral  They have regular follow up with the ophthalmologist  Dietary  issues and exercise activities discussed: Current Exercise Habits: Home exercise routine, Type of exercise: walking, Time (Minutes): 60, Frequency (Times/Week): 1, Weekly Exercise (Minutes/Week): 60, Intensity: Mild Healthy diet Good water intake   Goals Addressed             This Visit's Progress    Follow up with Primary Care Provider       As needed.       Depression Screen    02/08/2022    9:28 AM 11/29/2021   11:13 AM 04/27/2021   11:29 AM 07/27/2020    9:42 AM 03/02/2020   10:58 AM 07/25/2019   12:16 PM 06/06/2019    1:44 PM  PHQ 2/9 Scores  PHQ - 2 Score 0 0 0 0 0 0 0  PHQ- 9 Score     1      Fall Risk    02/08/2022    9:28 AM 11/29/2021   11:13 AM 04/27/2021   11:29 AM 09/24/2020    1:14 PM 07/27/2020    9:45 AM  Fall Risk   Falls in the past year? 0 1 0  0  Number falls in past yr: 0 1 0  0  Injury with Fall?  0 0  0  Risk for fall due to :  History of fall(s)     Follow up Falls evaluation completed Falls evaluation completed Falls evaluation completed Falls evaluation completed Falls evaluation completed    FALL RISK PREVENTION PERTAINING TO THE HOME: Home free of loose throw rugs in walkways, pet beds, electrical cords, etc? Yes  Adequate lighting in your home to reduce risk of falls? Yes   ASSISTIVE DEVICES UTILIZED TO PREVENT FALLS: Life alert? Yes  Use of a cane, walker or w/c? No  Grab bars in the bathroom? Yes  Shower chair or bench in shower? Yes  Elevated toilet seat or a handicapped toilet? Yes   TIMED UP AND GO: Was the test performed? No .   Cognitive Function: Patient is alert and oriented x3.     07/03/2017   12:16 PM 07/01/2016   10:57 AM 07/01/2015   11:13 AM  MMSE - Mini Mental State Exam  Orientation to time 5 5 5   Orientation to Place 5 5 5   Registration 3 3 3   Attention/ Calculation 5 5 5   Recall 3 3 3   Language- name 2 objects 2 2 2   Language- repeat 1 1 1   Language- follow 3 step command 3 3 3   Language- read & follow  direction 1 1 1   Write a sentence 1 1 1   Copy design 1 1 1   Total score 30 30 30         07/27/2020    9:59 AM 07/25/2019   12:28 PM 07/19/2018   11:58 AM  6CIT Screen  What Year? 0 points 0 points 0 points  What month? 0 points 0 points 0 points  What time? 0 points 0 points 0 points  Count back from 20 0 points 0 points 0 points  Months in reverse 0 points 0 points 0 points  Repeat phrase  0 points 0 points  Total Score  0 points 0 points    Immunizations Immunization History  Administered Date(s) Administered   Fluad Quad(high Dose 65+) 06/06/2019, 07/07/2020, 06/21/2021   Influenza Split 07/18/2014   Influenza, High Dose Seasonal PF 07/01/2015, 06/13/2016, 05/23/2017, 06/25/2018   Influenza,inj,Quad PF,6+ Mos 06/11/2013  Moderna Covid-19 Vaccine Bivalent Booster 75yrs & up 09/04/2021   Moderna Sars-Covid-2 Vaccination 10/08/2019, 11/05/2019, 07/24/2020   Pneumococcal Conjugate-13 12/18/2013   Pneumococcal Polysaccharide-23 07/01/2015   Tdap 11/15/2016   Zoster Recombinat (Shingrix) 06/25/2018   Shingrix Completed?: No.    Education has been provided regarding the importance of this vaccine. Patient has been advised to call insurance company to determine out of pocket expense if they have not yet received this vaccine. Advised may also receive vaccine at local pharmacy or Health Dept. Verbalized acceptance and understanding.  Screening Tests Health Maintenance  Topic Date Due   Zoster Vaccines- Shingrix (2 of 2) 04/13/2022 (Originally 08/20/2018)   INFLUENZA VACCINE  04/26/2022   MAMMOGRAM  01/11/2023   TETANUS/TDAP  11/15/2026   Pneumonia Vaccine 15+ Years old  Completed   DEXA SCAN  Completed   COVID-19 Vaccine  Completed   HPV VACCINES  Aged Out   Health Maintenance There are no preventive care reminders to display for this patient.  Lung Cancer Screening: (Low Dose CT Chest recommended if Age 59-80 years, 30 pack-year currently smoking OR have quit w/in  15years.) does not qualify.   Hepatitis C Screening: does not qualify.  Vision Screening: Recommended annual ophthalmology exams for early detection of glaucoma and other disorders of the eye.  Dental Screening: Recommended annual dental exams for proper oral hygiene  Community Resource Referral / Chronic Care Management: CRR required this visit?  No   CCM required this visit?  No      Plan:   Keep all routine maintenance appointments.   I have personally reviewed and noted the following in the patient's chart:   Medical and social history Use of alcohol, tobacco or illicit drugs  Current medications and supplements including opioid prescriptions.  Functional ability and status Nutritional status Physical activity Advanced directives List of other physicians Hospitalizations, surgeries, and ER visits in previous 12 months Vitals Screenings to include cognitive, depression, and falls Referrals and appointments  In addition, I have reviewed and discussed with patient certain preventive protocols, quality metrics, and best practice recommendations. A written personalized care plan for preventive services as well as general preventive health recommendations were provided to patient.     Ashok Pall, LPN   4/54/0981

## 2022-02-08 NOTE — Telephone Encounter (Signed)
Letter received from Dr. Nicki Reaper and placed in Dr. Zoila Shutter look at folder for review.  ? ?Routing to Dr. Mortimer Fries to make aware.  ?

## 2022-02-08 NOTE — Telephone Encounter (Signed)
I messaged Dr Mortimer Fries.  His response:  "Thank you letting me know! I have very few clinics at this time... but I will review her chart and see what documents are needed. Usually the surgeon sends over a document." Please forward a copy of the information we have.   ?

## 2022-02-08 NOTE — Telephone Encounter (Signed)
Pt advised we are waiting to hear from Dr Mortimer Fries about recommendations prior to surg. ?

## 2022-02-08 NOTE — Patient Instructions (Addendum)
?  Kerri Mills , ?Thank you for taking time to come for your Medicare Wellness Visit. I appreciate your ongoing commitment to your health goals. Please review the following plan we discussed and let me know if I can assist you in the future.  ? ?These are the goals we discussed: ? Goals   ? ?  Follow up with Primary Care Provider   ?  As needed. ?  ? ?  ?  ?This is a list of the screening recommended for you and due dates:  ?Health Maintenance  ?Topic Date Due  ? Zoster (Shingles) Vaccine (2 of 2) 04/13/2022*  ? Flu Shot  04/26/2022  ? Mammogram  01/11/2023  ? Tetanus Vaccine  11/15/2026  ? Pneumonia Vaccine  Completed  ? DEXA scan (bone density measurement)  Completed  ? COVID-19 Vaccine  Completed  ? HPV Vaccine  Aged Out  ?*Topic was postponed. The date shown is not the original due date.  ?  ?

## 2022-02-09 NOTE — Telephone Encounter (Signed)
Please see if can locate fax.  If do not receive today, please call margie and confirm where is being faxed.  Please hold until we receive.  Thanks  ?

## 2022-02-09 NOTE — Telephone Encounter (Signed)
Form has been completed by Dr. Mortimer Fries and faxed back to Dr. Nicki Reaper.  ? ?Routing to Dr. Nicki Reaper to make aware.  ?

## 2022-02-10 NOTE — Telephone Encounter (Signed)
Per Dr Nicki Reaper fax recieved

## 2022-02-10 NOTE — Telephone Encounter (Signed)
Noted  

## 2022-02-14 ENCOUNTER — Encounter: Payer: Self-pay | Admitting: Cardiology

## 2022-02-14 NOTE — Telephone Encounter (Signed)
Dr. Mortimer Fries is waiting on letter from provider for her foot surgery and the surgery will be done at the hospital

## 2022-02-14 NOTE — Progress Notes (Signed)
PERIOPERATIVE PRESCRIPTION FOR IMPLANTED CARDIAC DEVICE PROGRAMMING  Patient Information: Name:  Kerri Mills  DOB:  1930-11-16  MRN:  [id number]    Planned Procedure: Bilateral hammer toes correction  Surgeon:  Dr. Daylene Katayama  Date of Procedure: 02/24/22  Cautery will be used.  Position during surgery:   Please send documentation back to:  [facility] (Fax # [phone] Device Information:  Clinic EP Physician:  Allegra Lai, MD   Device Type:  Pacemaker Manufacturer and Phone #:  [employer]: [phone] Pacemaker Dependent?:  No. Date of Last Device Check:  01/20/2022 Normal Device Function?:  Yes.    Electrophysiologist's Recommendations:  Have magnet available. Provide continuous ECG monitoring when magnet is used or reprogramming is to be performed.  Procedure should not interfere with device function.  No device programming or magnet placement needed.  Per Device Clinic Standing Orders, Wanda Plump, RN  4:36 PM 02/14/2022

## 2022-02-14 NOTE — Patient Instructions (Addendum)
DUE TO COVID-19 ONLY TWO VISITORS  (aged 86 and older)  ARE ALLOWED TO COME WITH YOU AND STAY IN THE WAITING ROOM ONLY DURING PRE OP AND PROCEDURE.   **NO VISITORS ARE ALLOWED IN THE SHORT STAY AREA OR RECOVERY ROOM!!**  IF YOU WILL BE ADMITTED INTO THE HOSPITAL YOU ARE ALLOWED ONLY FOUR SUPPORT PEOPLE DURING VISITATION HOURS ONLY (7 AM -8PM)   The support person(s) must pass our screening, gel in and out, and wear a mask at all times, including in the patient's room. Patients must also wear a mask when staff or their support person are in the room. Visitors GUEST BADGE MUST BE WORN VISIBLY  One adult visitor may remain with you overnight and MUST be in the room by 8 P.M.     Your procedure is scheduled on: 02/24/22   Report to [facility] Main Entrance    Report to admitting at 12:00 noon   Call this number if you have problems the morning of surgery [phone]   Do not eat food :After Midnight.   After Midnight you may have the following liquids until __8:00_ AM/  DAY OF SURGERY  Water Black Coffee (sugar ok, NO MILK/CREAM OR CREAMERS)  Tea (sugar ok, NO MILK/CREAM OR CREAMERS) regular and decaf                             Plain Jell-O (NO RED)                                           Fruit ices (not with fruit pulp, NO RED)                                     Popsicles (NO RED)                                                                  Juice: apple, WHITE grape, WHITE cranberry Sports drinks like Gatorade (NO RED) Clear broth(vegetable,chicken,beef)                        If you have questions, please contact your surgeon's office.       Oral Hygiene is also important to reduce your risk of infection.                                    Remember - BRUSH YOUR TEETH THE MORNING OF SURGERY WITH YOUR REGULAR TOOTHPASTE   Do NOT smoke after Midnight   Take these medicines the morning of surgery with A SIP OF WATER: Metoprolol, Levothyroxine, Gabapentin,  Omeprazole   Bring CPAP mask and tubing day of surgery.                              You may not have any metal on your body including hair pins, jewelry, and body piercing  Do not wear make-up, lotions, powders, perfumes/cologne, or deodorant  Do not wear nail polish including gel and S&S, artificial/acrylic nails, or any other type of covering on natural nails including finger and toenails. If you have artificial nails, gel coating, etc. that needs to be removed by a nail salon please have this removed prior to surgery or surgery may need to be canceled/ delayed if the surgeon/ anesthesia feels like they are unable to be safely monitored.   Do not shave  48 hours prior to surgery.     Do not bring valuables to the hospital. [facility].   Contacts, dentures or bridgework may not be worn into surgery.     Patients discharged on the day of surgery will not be allowed to drive home.  Someone NEEDS to stay with you for the first 24 hours after anesthesia.   Special Instructions: Bring a copy of your healthcare power of attorney and living will documents the day of surgery if you haven't scanned them before.              Please read over the following fact sheets you were given: IF YOU HAVE QUESTIONS ABOUT YOUR PRE-OP INSTRUCTIONS PLEASE CALL [phone]     [facility] - Preparing for Surgery Before surgery, you can play an important role.  Because skin is not sterile, your skin needs to be as free of germs as possible.  You can reduce the number of germs on your skin by washing with CHG (chlorahexidine gluconate) soap before surgery.  CHG is an antiseptic cleaner which kills germs and bonds with the skin to continue killing germs even after washing. Please DO NOT use if you have an allergy to CHG or antibacterial soaps.  If your skin becomes reddened/irritated stop using the CHG and inform your nurse when you arrive at Short  Stay. Do not shave (including legs and underarms) for at least 48 hours prior to the first CHG shower.   Please follow these instructions carefully:  1.  Shower with CHG Soap the night before surgery and the  morning of Surgery.  2.  If you choose to wash your hair, wash your hair first as usual with your  normal  shampoo.  3.  After you shampoo, rinse your hair and body thoroughly to remove the  shampoo.                            4.  Use CHG as you would any other liquid soap.  You can apply chg directly  to the skin and wash                       Gently with a scrungie or clean washcloth.  5.  Apply the CHG Soap to your body ONLY FROM THE NECK DOWN.   Do not use on face/ open                           Wound or open sores. Avoid contact with eyes, ears mouth and genitals (private parts).                       Wash face,  Genitals (private parts) with your normal soap.  6.  Wash thoroughly, paying special attention to the area where your surgery  will be performed.  7.  Thoroughly rinse your body with warm water from the neck down.  8.  DO NOT shower/wash with your normal soap after using and rinsing off  the CHG Soap.                9.  Pat yourself dry with a clean towel.            10.  Wear clean pajamas.            11.  Place clean sheets on your bed the night of your first shower and do not  sleep with pets. Day of Surgery : Do not apply any lotions/deodorants the morning of surgery.  Please wear clean clothes to the hospital/surgery center.  FAILURE TO FOLLOW THESE INSTRUCTIONS MAY RESULT IN THE CANCELLATION OF YOUR SURGERY    ________________________________________________________________________

## 2022-02-16 ENCOUNTER — Other Ambulatory Visit: Payer: Self-pay | Admitting: Internal Medicine

## 2022-02-16 MED ORDER — TRIAMCINOLONE ACETONIDE 0.1 % EX CREA: 1.0000 "application " | TOPICAL_CREAM | Freq: Two times a day (BID) | CUTANEOUS | 0 refills | Status: DC

## 2022-02-16 NOTE — Progress Notes (Signed)
Rx sent in for TCC cream.

## 2022-02-16 NOTE — Telephone Encounter (Signed)
Noted.  Please send my office note from 02/01/22, a copy of cardiology and pulmonary note for pre op recommendations, etc.  Pulmonary note in box.

## 2022-02-17 ENCOUNTER — Other Ambulatory Visit: Payer: Self-pay

## 2022-02-17 ENCOUNTER — Encounter (HOSPITAL_COMMUNITY): Payer: Self-pay

## 2022-02-17 ENCOUNTER — Encounter (HOSPITAL_COMMUNITY)
Admission: RE | Admit: 2022-02-17 | Discharge: 2022-02-17 | Disposition: A | Discharge status: Home / Self Care | Payer: PPO | Source: Ambulatory Visit | Attending: Podiatry | Admitting: Podiatry

## 2022-02-17 VITALS — BP 162/92 | HR 53 | Temp 98.2°F | Ht 65.0 in | Wt 167.0 lb

## 2022-02-17 DIAGNOSIS — M2041 Other hammer toe(s) (acquired), right foot: Secondary | ICD-10-CM | POA: Diagnosis not present

## 2022-02-17 DIAGNOSIS — Z950 Presence of cardiac pacemaker: Secondary | ICD-10-CM | POA: Insufficient documentation

## 2022-02-17 DIAGNOSIS — G4730 Sleep apnea, unspecified: Secondary | ICD-10-CM | POA: Diagnosis not present

## 2022-02-17 DIAGNOSIS — Z7901 Long term (current) use of anticoagulants: Secondary | ICD-10-CM | POA: Diagnosis not present

## 2022-02-17 DIAGNOSIS — I4891 Unspecified atrial fibrillation: Secondary | ICD-10-CM | POA: Insufficient documentation

## 2022-02-17 DIAGNOSIS — K219 Gastro-esophageal reflux disease without esophagitis: Secondary | ICD-10-CM | POA: Diagnosis not present

## 2022-02-17 DIAGNOSIS — Z01812 Encounter for preprocedural laboratory examination: Secondary | ICD-10-CM | POA: Diagnosis not present

## 2022-02-17 DIAGNOSIS — I10 Essential (primary) hypertension: Secondary | ICD-10-CM | POA: Diagnosis not present

## 2022-02-17 DIAGNOSIS — M2042 Other hammer toe(s) (acquired), left foot: Secondary | ICD-10-CM | POA: Diagnosis not present

## 2022-02-17 HISTORY — DX: Hypothyroidism, unspecified: E03.9

## 2022-02-17 HISTORY — DX: Cardiac arrhythmia, unspecified: I49.9

## 2022-02-17 HISTORY — DX: Presence of cardiac pacemaker: Z95.0

## 2022-02-17 LAB — CBC
HCT: 43.6 % (ref 36.0–46.0)
Hemoglobin: 13.9 g/dL (ref 12.0–15.0)
MCH: 30.4 pg (ref 26.0–34.0)
MCHC: 31.9 g/dL (ref 30.0–36.0)
MCV: 95.4 fL (ref 80.0–100.0)
Platelets: 209 10*3/uL (ref 150–400)
RBC: 4.57 MIL/uL (ref 3.87–5.11)
RDW: 12.4 % (ref 11.5–15.5)
WBC: 7.1 10*3/uL (ref 4.0–10.5)
nRBC: 0 % (ref 0.0–0.2)

## 2022-02-17 LAB — BASIC METABOLIC PANEL
Anion gap: 7 (ref 5–15)
BUN: 14 mg/dL (ref 8–23)
CO2: 28 mmol/L (ref 22–32)
Calcium: 10 mg/dL (ref 8.9–10.3)
Chloride: 105 mmol/L (ref 98–111)
Creatinine, Ser: 0.86 mg/dL (ref 0.44–1.00)
GFR, Estimated: >60 mL/min (ref >60)
Glucose, Bld: 122 mg/dL — ABNORMAL HIGH (ref 70–99)
Potassium: 4.4 mmol/L (ref 3.5–5.1)
Sodium: 140 mmol/L (ref 135–145)

## 2022-02-17 NOTE — Progress Notes (Addendum)
For Short Stay: [name] appointment date: Date of COVID positive in last 12 days:  Bowel Prep reminder:   For Anesthesia: PCP - Dr. Einar Pheasant Cardiologist - Dr. Ida Rogue  Chest x-ray - 11/29/21 EKG - 08/02/21 Stress Test - 2020 ECHO - 2021 Cardiac Cath -  Pacemaker/ICD device last checked: St Jude. 01/20/22 Pacemaker orders received: Epic/Chart Device Rep notified: N/A  Spinal Cord Stimulator:  Sleep Study - Yes CPAP - Yes  Fasting Blood Sugar -  Checks Blood Sugar _____ times a day Date and result of last Hgb A1c-  Blood Thinner Instructions: Eliquis will be on hold 3 days before surgery Aspirin Instructions: Last Dose:  Activity level: Can go up a flight of stairs and activities of daily living without stopping and without chest pain and/or shortness of breath   Able to exercise without chest pain and/or shortness of breath   Unable to go up a flight of stairs without chest pain and/or shortness of breath     Anesthesia review: Hx: HTN,Afib,Pacemaker  Patient denies shortness of breath, fever, cough and chest pain at PAT appointment   Patient verbalized understanding of instructions that were given to them at the PAT appointment. Patient was also instructed that they will need to review over the PAT instructions again at home before surgery.

## 2022-02-17 NOTE — Telephone Encounter (Signed)
Faxed. Confirmation recieved

## 2022-02-18 NOTE — Progress Notes (Signed)
Anesthesia Chart Review   Case: [id number] Date/Time: 02/24/22 1400   Procedure: HAMMER TOE CORRECTION 2, 3, 4 Right and 2nd Left (Bilateral: Toe)   Anesthesia type: Choice   Pre-op diagnosis: hammer toe   Location: [facility] ROOM 08 / [facility]   Surgeons: Edrick Kins, DPM       DISCUSSION:88 y.o. never smoker with h/o HTN, GERD, sleep apnea, atrial fibrillation (on Eliquis), pacemaker in place (device orders in 02/14/22 progress note), hammer toe scheduled for above procedure 02/24/22 with Daylene Katayama, DPM.   Per cardiology preoperative evaluation 01/28/2022, "Chart reviewed as part of pre-operative protocol coverage. Patient has upcoming sequential hammertoe repair. Per Dr. Rockey Situ, "Acceptable risk for foot surgery. We would recommend she hold Eliquis 2 days prior to procedure. No further cardiac testing needed. Would recommend she take metoprolol morning before the procedure in effort to limit recurrence of her tach arrhythmia/A-fib."  Followed by pulmonology, pt with bronchiectasis using flutter valve.  Last seen by pulmonology 12/29/2021, sx improved at that visit. Evaluate DOS. Chest Xray 11/29/2021 with no cardiopulmonary disease. Pulmonologist, Dr. Mortimer Fries aware of upcoming surgery, ok to proceed.   Anticipate pt can proceed with planned procedure barring acute status change.   VS: BP (!) 162/92   Pulse (!) 53   Temp 36.8 C (Oral)   Ht '5\' 5"'$  (1.651 m)   Wt 75.8 kg   SpO2 94%   BMI 27.79 kg/m   PROVIDERS: Einar Pheasant, MD is PCP   Cardiologist - Dr. Ida Rogue  LABS: Labs reviewed: Acceptable for surgery. (all labs ordered are listed, but only abnormal results are displayed)  Labs Reviewed  BASIC METABOLIC PANEL - Abnormal; Notable for the following components:      Result Value   Glucose, Bld 122 (*)    All other components within normal limits  CBC     IMAGES:   EKG: 08/02/2021 Rate 60 bpm  Atrial-paced rhythm with prolonged AV conduction Left axis deviation Minimal  voltage criteria for LVH, may be normal variant Cannot rule out anterior infarct, age undetermined   CV: Echo 10/21/2019 1. Left ventricular ejection fraction, by visual estimation, is 55 to  60%. The left ventricle has normal function. There is moderately increased  left ventricular hypertrophy.   2. Left ventricular diastolic parameters are indeterminate.   3. The left ventricle has no regional wall motion abnormalities.   4. Global right ventricle has normal systolic function.The right  ventricular size is normal. No increase in right ventricular wall  thickness.   5. Left atrial size was mildly dilated.   6. Right atrial size was normal.   7. The mitral valve is normal in structure. Trivial mitral valve  regurgitation. No evidence of mitral stenosis.   8. The tricuspid valve is normal in structure. Tricuspid valve  regurgitation is trivial.   9. The aortic valve is normal in structure. Aortic valve regurgitation is  not visualized. Mild to moderate aortic valve sclerosis/calcification  without any evidence of aortic stenosis.  10. The pulmonic valve was normal in structure. Pulmonic valve  regurgitation is not visualized.  11. TR signal is inadequate for assessing pulmonary artery systolic  pressure.  12. The inferior vena cava is normal in size with greater than 50%  respiratory variability, suggesting right atrial pressure of 3 mmHg.  Past Medical History:  Diagnosis Date   Allergy    Anxiety    Arthritis    Atypical chest pain    a. 10/2018 MV: small,  fixed apical defect possibly 2/2 attenuation artifact. No ischemia.  EF 68%.   Clotting disorder ([place])    Colitis    Depression    Diverticulitis 2013   Dysrhythmia    Gastric ulcer    GERD (gastroesophageal reflux disease)    History of echocardiogram    a. 09/2019 Echo: EF 55-60%, mod LVH. Mildly dil LA. Triv MR/TR.   Hypercholesterolemia    Hypertension    Hypothyroidism    Infiltrating lobular carcinoma of left  breast 2011   T2,N0, ER: 90%; PR 0%; Her 2 neu not amplified. [facility]).   Melanoma ([place]) 1997   Melanoma in situ of upper extremity ([place]) 03/19/2011   Persistent atrial fibrillation ([place])    a. CHADS2VASc => 4 (HTN, age x 2, female)   Personal history of radiation therapy 2011   BREAST CA   Presence of permanent cardiac pacemaker    Seroma    HISTORY OF LFT BREAST   Sleep apnea    Thyroid cancer ([place]) 1992    Past Surgical History:  Procedure Laterality Date   ABDOMINAL HYSTERECTOMY  1973   partial   BREAST BIOPSY Left 02-13-13   BENIGN BREAST TISSUE WITH CHANGES CONSISTENT WITH FAT NECROSIS   BREAST BIOPSY Left 01/21/2015   bx done in [facility] 11:00 left 6-8cmfn   BREAST EXCISIONAL BIOPSY Left 1995   neg   BREAST EXCISIONAL BIOPSY Left 2011   Breast cancer radiation   BREAST LUMPECTOMY Left 2011   BREAST CA   CARDIAC CATHETERIZATION     CHOLECYSTECTOMY     COLONOSCOPY  2013   COLONOSCOPY WITH PROPOFOL N/A 06/09/2021   Procedure: COLONOSCOPY WITH PROPOFOL;  Surgeon: Jonathon Bellows, MD;  [facility] ENDOSCOPY;  Service: Gastroenterology;  Laterality: N/A;   MELANOMA EXCISION     RT UPPER ARM   PACEMAKER IMPLANT N/A 01/10/2020   Procedure: PACEMAKER IMPLANT;  Surgeon: Constance Haw, MD;  Location: [facility];  Service: Cardiovascular;  Laterality: N/A;   PARTIAL HYSTERECTOMY     bleeding, ovaries in place.     THYROID SURGERY  1992   FOR THYROID CANCER   TONSILLECTOMY      MEDICATIONS:  acetaminophen (TYLENOL) 650 MG CR tablet   albuterol (VENTOLIN HFA) 108 (90 Base) MCG/ACT inhaler   azelastine (ASTELIN) 0.1 % nasal spray   cholecalciferol (VITAMIN D3) 25 MCG (1000 UNIT) tablet   diphenhydrAMINE (BENADRYL) 25 MG tablet   DULoxetine (CYMBALTA) 60 MG capsule   ELIQUIS 5 MG TABS tablet   furosemide (LASIX) 20 MG tablet   gabapentin (NEURONTIN) 300 MG capsule   levothyroxine (SYNTHROID) 100 MCG tablet   lovastatin (MEVACOR) 40 MG tablet    metoprolol tartrate (LOPRESSOR) 50 MG tablet   Multiple Vitamins-Minerals (PRESERVISION AREDS 2+MULTI VIT PO)   omeprazole (PRILOSEC) 20 MG capsule   Polyethyl Glycol-Propyl Glycol (SYSTANE OP)   polyethylene glycol powder (GLYCOLAX/MIRALAX) 17 GM/SCOOP powder   potassium chloride (KLOR-CON) 10 MEQ tablet   Probiotic Product (ALIGN) 4 MG CAPS   triamcinolone cream (KENALOG) 0.1 %   vitamin B-12 (CYANOCOBALAMIN) 1000 MCG tablet   White Petrolatum-Mineral Oil (GENTEAL TEARS NIGHT-TIME OP)   No current facility-administered medications for this encounter.     Konrad Felix Ward, PA-C [facility] [phone]

## 2022-02-18 NOTE — Anesthesia Preprocedure Evaluation (Signed)
Anesthesia Evaluation  Patient identified by MRN, date of birth, ID band Patient awake    Reviewed: Allergy & Precautions, NPO status , reviewed documented beta blocker date and time   History of Anesthesia Complications Negative for: history of anesthetic complications  Airway Mallampati: II  TM Distance: >3 FB Neck ROM: Full    Dental no notable dental hx. (+) Dental Advisory Given   Pulmonary sleep apnea ,    Pulmonary exam normal breath sounds clear to auscultation       Cardiovascular hypertension, Pt. on medications and Pt. on home beta blockers Normal cardiovascular exam+ dysrhythmias Atrial Fibrillation + pacemaker  Rhythm:Regular Rate:Normal  IMPRESSIONS  09/2019  1. Left ventricular ejection fraction, by visual estimation, is 55 to  60%. The left ventricle has normal function. There is moderately increased  left ventricular hypertrophy.  2. Left ventricular diastolic parameters are indeterminate.  3. The left ventricle has no regional wall motion abnormalities.  4. Global right ventricle has normal systolic function.The right  ventricular size is normal. No increase in right ventricular wall  thickness.  5. Left atrial size was mildly dilated.  6. Right atrial size was normal.  7. The mitral valve is normal in structure. Trivial mitral valve  regurgitation. No evidence of mitral stenosis.  8. The tricuspid valve is normal in structure. Tricuspid valve  regurgitation is trivial.  9. The aortic valve is normal in structure. Aortic valve regurgitation is  not visualized. Mild to moderate aortic valve sclerosis/calcification  without any evidence of aortic stenosis.  10. The pulmonic valve was normal in structure. Pulmonic valve  regurgitation is not visualized.  11. TR signal is inadequate for assessing pulmonary artery systolic  pressure.  12. The inferior vena cava is normal in size with greater than 50%   respiratory variability, suggesting right atrial pressure of 3 mmHg.    Neuro/Psych Anxiety Depression    GI/Hepatic GERD  ,  Endo/Other  Hypothyroidism   Renal/GU      Musculoskeletal  (+) Arthritis , Osteoarthritis,    Abdominal   Peds  Hematology   Anesthesia Other Findings   Reproductive/Obstetrics                          Anesthesia Physical Anesthesia Plan  ASA: 3  Anesthesia Plan: General   Post-op Pain Management: Dilaudid IV   Induction: Intravenous  PONV Risk Score and Plan: 3 and Ondansetron, Dexamethasone, Midazolam and Treatment may vary due to age or medical condition  Airway Management Planned: LMA  Additional Equipment:   Intra-op Plan:   Post-operative Plan: Extubation in OR  Informed Consent: I have reviewed the patients History and Physical, chart, labs and discussed the procedure including the risks, benefits and alternatives for the proposed anesthesia with the patient or authorized representative who has indicated his/her understanding and acceptance.     Dental advisory given  Plan Discussed with: Anesthesiologist  Anesthesia Plan Comments: (See PAT note 02/17/2022)      Anesthesia Quick Evaluation

## 2022-02-23 ENCOUNTER — Telehealth: Payer: Self-pay | Admitting: Cardiovascular Disease

## 2022-02-23 NOTE — Telephone Encounter (Signed)
Pt c/o medication issue:  1. Name of Medication:  ELIQUIS 5 MG TABS tablet  2. How are you currently taking this medication (dosage and times per day)?   3. Are you having a reaction (difficulty breathing--STAT)?   4. What is your medication issue?   Patient states she contacted [facility] and spoke with a rep named Devin who advised her that our office will need to complete an application for patient assistance for Eliquis. Patient provided a phone number for [facility], [phone] and requested a call back to her with any updates. She has a procedure scheduled for tomorrow, 6/01, and states she will not return home until Monday, 6/05. If her call is returned before then, please call her cell phone # on file.

## 2022-02-24 ENCOUNTER — Ambulatory Visit (HOSPITAL_BASED_OUTPATIENT_CLINIC_OR_DEPARTMENT_OTHER): Payer: PPO | Admitting: Certified Registered Nurse Anesthetist

## 2022-02-24 ENCOUNTER — Other Ambulatory Visit: Payer: Self-pay

## 2022-02-24 ENCOUNTER — Encounter (HOSPITAL_COMMUNITY)
Admission: RE | Disposition: A | Discharge status: Home / Self Care | Payer: Self-pay | Source: Ambulatory Visit | Attending: Podiatry

## 2022-02-24 ENCOUNTER — Ambulatory Visit (HOSPITAL_COMMUNITY): Payer: PPO | Admitting: Physician Assistant

## 2022-02-24 ENCOUNTER — Encounter (HOSPITAL_COMMUNITY): Payer: Self-pay | Admitting: Podiatry

## 2022-02-24 ENCOUNTER — Ambulatory Visit (HOSPITAL_COMMUNITY)
Admission: RE | Admit: 2022-02-24 | Discharge: 2022-02-24 | Disposition: A | Discharge status: Home / Self Care | Payer: PPO | Source: Ambulatory Visit | Attending: Podiatry | Admitting: Podiatry

## 2022-02-24 DIAGNOSIS — E039 Hypothyroidism, unspecified: Secondary | ICD-10-CM | POA: Diagnosis not present

## 2022-02-24 DIAGNOSIS — F418 Other specified anxiety disorders: Secondary | ICD-10-CM | POA: Diagnosis not present

## 2022-02-24 DIAGNOSIS — M21542 Acquired clubfoot, left foot: Secondary | ICD-10-CM | POA: Diagnosis not present

## 2022-02-24 DIAGNOSIS — K219 Gastro-esophageal reflux disease without esophagitis: Secondary | ICD-10-CM | POA: Diagnosis not present

## 2022-02-24 DIAGNOSIS — M2041 Other hammer toe(s) (acquired), right foot: Secondary | ICD-10-CM | POA: Insufficient documentation

## 2022-02-24 DIAGNOSIS — I10 Essential (primary) hypertension: Secondary | ICD-10-CM | POA: Diagnosis not present

## 2022-02-24 DIAGNOSIS — I4891 Unspecified atrial fibrillation: Secondary | ICD-10-CM

## 2022-02-24 DIAGNOSIS — M2042 Other hammer toe(s) (acquired), left foot: Secondary | ICD-10-CM

## 2022-02-24 DIAGNOSIS — Z950 Presence of cardiac pacemaker: Secondary | ICD-10-CM | POA: Diagnosis not present

## 2022-02-24 DIAGNOSIS — G4730 Sleep apnea, unspecified: Secondary | ICD-10-CM | POA: Insufficient documentation

## 2022-02-24 DIAGNOSIS — I4819 Other persistent atrial fibrillation: Secondary | ICD-10-CM | POA: Diagnosis not present

## 2022-02-24 DIAGNOSIS — M21541 Acquired clubfoot, right foot: Secondary | ICD-10-CM | POA: Diagnosis not present

## 2022-02-24 HISTORY — PX: HAMMER TOE SURGERY: SHX385

## 2022-02-24 SURGERY — CORRECTION, HAMMER TOE
Anesthesia: General | Site: Toe | Laterality: Bilateral

## 2022-02-24 MED ORDER — AMISULPRIDE (ANTIEMETIC) 5 MG/2ML IV SOLN: 10.0000 mg | Freq: Once | INTRAVENOUS | Status: DC | PRN

## 2022-02-24 MED ORDER — PROMETHAZINE HCL 25 MG/ML IJ SOLN: 6.2500 mg | INTRAMUSCULAR | Status: DC | PRN

## 2022-02-24 MED ORDER — PHENYLEPHRINE 80 MCG/ML (10ML) SYRINGE FOR IV PUSH (FOR BLOOD PRESSURE SUPPORT)
PREFILLED_SYRINGE | INTRAVENOUS | Status: AC
Start: 2022-02-24 — End: ?
  Filled 2022-02-24: qty 10

## 2022-02-24 MED ORDER — LIDOCAINE HCL (PF) 1 % IJ SOLN
INTRAMUSCULAR | Status: DC | PRN
  Administered 2022-02-24: 10 mL

## 2022-02-24 MED ORDER — PHENYLEPHRINE 80 MCG/ML (10ML) SYRINGE FOR IV PUSH (FOR BLOOD PRESSURE SUPPORT)
PREFILLED_SYRINGE | INTRAVENOUS | Status: DC | PRN
  Administered 2022-02-24: 120 ug via INTRAVENOUS
  Administered 2022-02-24 (×2): 80 ug via INTRAVENOUS

## 2022-02-24 MED ORDER — BUPIVACAINE HCL (PF) 0.5 % IJ SOLN
INTRAMUSCULAR | Status: AC
Start: 2022-02-24 — End: ?
  Filled 2022-02-24: qty 30

## 2022-02-24 MED ORDER — OXYCODONE HCL 5 MG/5ML PO SOLN: 5.0000 mg | Freq: Once | ORAL | Status: DC | PRN

## 2022-02-24 MED ORDER — CEFAZOLIN SODIUM-DEXTROSE 2-4 GM/100ML-% IV SOLN
INTRAVENOUS | Status: AC
  Filled 2022-02-24: qty 100

## 2022-02-24 MED ORDER — ONDANSETRON HCL 4 MG/2ML IJ SOLN
INTRAMUSCULAR | Status: AC
Start: 2022-02-24 — End: ?
  Filled 2022-02-24: qty 2

## 2022-02-24 MED ORDER — OXYCODONE HCL 5 MG PO TABS: 5.0000 mg | ORAL_TABLET | Freq: Once | ORAL | Status: DC | PRN

## 2022-02-24 MED ORDER — CEFAZOLIN SODIUM-DEXTROSE 2-4 GM/100ML-% IV SOLN
2.0000 g | Freq: Once | INTRAVENOUS | Status: AC
  Administered 2022-02-24: 2 g via INTRAVENOUS

## 2022-02-24 MED ORDER — BUPIVACAINE HCL (PF) 0.5 % IJ SOLN
INTRAMUSCULAR | Status: AC
  Filled 2022-02-24: qty 30

## 2022-02-24 MED ORDER — OXYCODONE-ACETAMINOPHEN 5-325 MG PO TABS: 1.0000 | ORAL_TABLET | ORAL | 0 refills | Status: DC | PRN

## 2022-02-24 MED ORDER — VANCOMYCIN HCL IN DEXTROSE 1-5 GM/200ML-% IV SOLN: 1000.0000 mg | INTRAVENOUS | Status: DC

## 2022-02-24 MED ORDER — BUPIVACAINE HCL (PF) 0.5 % IJ SOLN
INTRAMUSCULAR | Status: DC | PRN
  Administered 2022-02-24: 10 mL

## 2022-02-24 MED ORDER — LIDOCAINE HCL (PF) 2 % IJ SOLN
INTRAMUSCULAR | Status: AC
  Filled 2022-02-24: qty 5

## 2022-02-24 MED ORDER — LIDOCAINE 2% (20 MG/ML) 5 ML SYRINGE
INTRAMUSCULAR | Status: DC | PRN
  Administered 2022-02-24: 60 mg via INTRAVENOUS

## 2022-02-24 MED ORDER — PROPOFOL 10 MG/ML IV BOLUS
INTRAVENOUS | Status: DC | PRN
  Administered 2022-02-24: 100 mg via INTRAVENOUS

## 2022-02-24 MED ORDER — BUPIVACAINE HCL 0.25 % IJ SOLN
INTRAMUSCULAR | Status: AC
  Filled 2022-02-24: qty 1

## 2022-02-24 MED ORDER — DEXAMETHASONE SODIUM PHOSPHATE 10 MG/ML IJ SOLN
INTRAMUSCULAR | Status: DC | PRN
  Administered 2022-02-24: 5 mg via INTRAVENOUS

## 2022-02-24 MED ORDER — LACTATED RINGERS IV SOLN: INTRAVENOUS | Status: DC

## 2022-02-24 MED ORDER — CHLORHEXIDINE GLUCONATE 0.12 % MT SOLN
15.0000 mL | Freq: Once | OROMUCOSAL | Status: AC
  Administered 2022-02-24: 15 mL via OROMUCOSAL

## 2022-02-24 MED ORDER — FENTANYL CITRATE (PF) 100 MCG/2ML IJ SOLN
INTRAMUSCULAR | Status: DC | PRN
  Administered 2022-02-24 (×4): 25 ug via INTRAVENOUS

## 2022-02-24 MED ORDER — PROPOFOL 10 MG/ML IV BOLUS
INTRAVENOUS | Status: AC
  Filled 2022-02-24: qty 20

## 2022-02-24 MED ORDER — ORAL CARE MOUTH RINSE: 15.0000 mL | Freq: Once | OROMUCOSAL | Status: AC

## 2022-02-24 MED ORDER — 0.9 % SODIUM CHLORIDE (POUR BTL) OPTIME
TOPICAL | Status: DC | PRN
  Administered 2022-02-24: 1000 mL

## 2022-02-24 MED ORDER — HYDROMORPHONE HCL 1 MG/ML IJ SOLN: 0.2500 mg | INTRAMUSCULAR | Status: DC | PRN

## 2022-02-24 MED ORDER — LIDOCAINE HCL (PF) 1 % IJ SOLN
INTRAMUSCULAR | Status: AC
Start: 2022-02-24 — End: ?
  Filled 2022-02-24: qty 30

## 2022-02-24 MED ORDER — ONDANSETRON HCL 4 MG/2ML IJ SOLN
INTRAMUSCULAR | Status: DC | PRN
  Administered 2022-02-24: 4 mg via INTRAVENOUS

## 2022-02-24 MED ORDER — FENTANYL CITRATE (PF) 100 MCG/2ML IJ SOLN
INTRAMUSCULAR | Status: AC
  Filled 2022-02-24: qty 2

## 2022-02-24 SURGICAL SUPPLY — 51 items
"1.25MM K WIRE " IMPLANT
1.25MM K WIRE ×1 IMPLANT
BAG COUNTER SPONGE SURGICOUNT (BAG) IMPLANT
BAG SPEC THK2 15X12 ZIP CLS (MISCELLANEOUS) ×1
BAG SPNG CNTER NS LX DISP (BAG)
BAG ZIPLOCK 12X15 (MISCELLANEOUS) ×2 IMPLANT
BIT DRILL 100X2XQC STRL (BIT) IMPLANT
BIT DRILL PROS QC 1.5 (BIT) ×1 IMPLANT
BIT DRILL QC 2.0X100 (BIT) ×2
BIT DRL 100X2XQC STRL (BIT) ×1
BLADE AVERAGE 25X9 (BLADE) ×1 IMPLANT
BLADE OSC/SAG .038X5.5 CUT EDG (BLADE) ×1 IMPLANT
BLADE SURG 15 STRL LF DISP TIS (BLADE) IMPLANT
BLADE SURG 15 STRL SS (BLADE) ×4
BLADE SURG SZ10 CARB STEEL (BLADE) ×4 IMPLANT
BNDG ELASTIC 4X5.8 VLCR STR LF (GAUZE/BANDAGES/DRESSINGS) ×2 IMPLANT
BNDG GAUZE ELAST 4 BULKY (GAUZE/BANDAGES/DRESSINGS) ×2 IMPLANT
COVER SURGICAL LIGHT HANDLE (MISCELLANEOUS) ×2 IMPLANT
CUFF TOURN SGL QUICK 18X4 (TOURNIQUET CUFF) ×3 IMPLANT
DRAPE SURG 17X11 SM STRL (DRAPES) ×2 IMPLANT
ELECT REM PT RETURN 15FT ADLT (MISCELLANEOUS) ×2 IMPLANT
GAUZE SPONGE 4X4 12PLY STRL (GAUZE/BANDAGES/DRESSINGS) ×4 IMPLANT
GAUZE XEROFORM 1X8 LF (GAUZE/BANDAGES/DRESSINGS) ×2 IMPLANT
GLOVE BIO SURGEON STRL SZ8 (GLOVE) ×2 IMPLANT
GOWN STRL REUS W/ TWL XL LVL3 (GOWN DISPOSABLE) ×1 IMPLANT
GOWN STRL REUS W/TWL XL LVL3 (GOWN DISPOSABLE) ×2
K-WIRE 1.25 TRCR POINT 150 (WIRE) ×2
K-WIRE CAPS STERILE WHITE .045 (WIRE) ×1 IMPLANT
K-WIRE PROS 0.6X70 (WIRE) ×4
K-WIRE THRD 45X9 (Wire) ×6 IMPLANT
KIT BASIN OR (CUSTOM PROCEDURE TRAY) ×2 IMPLANT
KIT TURNOVER KIT A (KITS) IMPLANT
KWIRE 1.25 TRCR POINT 150 (WIRE) IMPLANT
KWIRE PROS 0.6X70 (WIRE) IMPLANT
KWIRE THRD 45X9 (Wire) IMPLANT
MANIFOLD NEPTUNE II (INSTRUMENTS) ×2 IMPLANT
NS IRRIG 1000ML POUR BTL (IV SOLUTION) ×2 IMPLANT
PACK ORTHO EXTREMITY (CUSTOM PROCEDURE TRAY) ×2 IMPLANT
PAD CAST 4YDX4 CTTN HI CHSV (CAST SUPPLIES) ×1 IMPLANT
PADDING CAST COTTON 4X4 STRL (CAST SUPPLIES) ×2
PENCIL SMOKE EVACUATOR (MISCELLANEOUS) IMPLANT
PROTECTOR NERVE ULNAR (MISCELLANEOUS) ×1 IMPLANT
SCREW CORTEX ST 2.0X14 (Screw) ×2 IMPLANT
SOL PREP POV-IOD 4OZ 10% (MISCELLANEOUS) ×2 IMPLANT
SOL SCRUB PVP POV-IOD 4OZ 7.5% (MISCELLANEOUS)
SOLUTION SCRB POV-IOD 4OZ 7.5% (MISCELLANEOUS) ×1 IMPLANT
SUT PROLENE 3 0 PS 2 (SUTURE) ×1 IMPLANT
SUT PROLENE 4 0 PS 2 18 (SUTURE) ×2 IMPLANT
SUT VIC AB 4-0 PS2 27 (SUTURE) ×2 IMPLANT
TOWEL OR 17X26 10 PK STRL BLUE (TOWEL DISPOSABLE) ×2 IMPLANT
WATER STERILE IRR 1000ML POUR (IV SOLUTION) ×2 IMPLANT

## 2022-02-24 NOTE — Progress Notes (Signed)
Orthopedic Tech Progress Note Patient Details:  LASUNDRA HASCALL 05-20-1931 [id number]   Ortho Devices Type of Ortho Device: Postop shoe/boot Ortho Device/Splint Location: BLE Ortho Device/Splint Interventions: Ordered, Application, Adjustment   Post Interventions Patient Tolerated: Well Instructions Provided: Care of device, Poper ambulation with device, Adjustment of device  Meeah Totino Jeri Modena 02/24/2022, 5:12 PM

## 2022-02-24 NOTE — Op Note (Signed)
   OPERATIVE REPORT Patient name: Kerri Mills MRN: [id number] DOB: 09-25-1931  DOS: 02/24/22  Preop Dx: Postop Dx: same  Procedure:  1. ***  Surgeon: Edrick Kins DPM  Anesthesia: 50-50 mixture of 2% lidocaine plain with 0.5% Marcaine plain totaling *** infiltrated in the patient's {RT/LT/BL}   Hemostasis: Calf tourniquet inflated to a pressure of 236mHg after esmarch exsanguination   EBL: *** mL Materials: *** Injectables: *** Pathology: ***  Condition: The patient tolerated the procedure and anesthesia well. No complications noted or reported   Justification for procedure: The patient is a 88y.o. '@GENDER'$ @ who presents today for surgical correction of ***. All conservative modalities of been unsuccessful in providing any sort of satisfactory alleviation of symptoms with the patient. The patient was told benefits as well as possible side effects of the surgery. The patient consented for surgical correction. The patient consent form was reviewed. All patient questions were answered. No guarantees were expressed or implied. The patient and the surgeon both signed the patient consent form with the witness present and placed in the patient's chart.   Procedure in Detail: The patient was brought to the operating room, placed in the operating table in the supine position at which time an aseptic scrub and drape were performed about the patient's respective lower extremity after anesthesia was induced as described above. Attention was then directed to the surgical area where procedure number one commenced.  Procedure #1:   Dry sterile compressive dressings were then applied to all previously mentioned incision sites about the patient's lower extremity. The tourniquet which was used for hemostasis was deflated. All normal neurovascular responses including pink color and warmth returned all the digits of patient's lower extremity.  The patient was then transferred from the operating  room to the recovery room having tolerated the procedure and anesthesia well. All vital signs are stable. After a brief stay in the recovery room the patient was {readmitted to inpatient room/discharged} with adequate prescriptions for analgesia. Verbal as well as written instructions were provided for the patient regarding wound care. The patient is to keep the dressings clean dry and intact until they are to follow surgeon Dr. BDaylene Katayamain the office upon discharge.   BEdrick Kins DPM [facility]  Dr. BEdrick Kins DPM    [address] [place] [address]               Office [phone] Fax [phone]

## 2022-02-24 NOTE — Discharge Instructions (Addendum)
Keep dressings clean and dry. Minimal weight bearing in surgical shoes bilateral.  While taking narcotics be sure have bowel movement every 3 days. May resume normal diet previous to surgery. Keep feet dry do not put in water.  Call for signs of infection (odor,drainage,red streaks,warmth at surgical site, fever greater than 101.0 etc.). Wear orthotic shoes when up ambulating. Minimal weight bearing. Follow up with DR. Evans  June 9th,2023 '@0945'$  am. Call for any questions [phone].

## 2022-02-24 NOTE — Anesthesia Postprocedure Evaluation (Signed)
Anesthesia Post Note  Patient: Kerri Mills  Procedure(s) Performed: HAMMER TOE CORRECTION 2, 3, 4 Right and 2nd Left (Bilateral: Toe)     Patient location during evaluation: PACU Anesthesia Type: General Level of consciousness: awake and alert Pain management: pain level controlled Vital Signs Assessment: post-procedure vital signs reviewed and stable Respiratory status: spontaneous breathing, nonlabored ventilation, respiratory function stable and patient connected to nasal cannula oxygen Cardiovascular status: blood pressure returned to baseline and stable Postop Assessment: no apparent nausea or vomiting Anesthetic complications: no   No notable events documented.  Last Vitals:  Vitals:   02/24/22 1715 02/24/22 1716  BP: 120/71 120/71  Pulse: 62 63  Resp: 17 16  Temp: 37 C 37 C  SpO2: 96% 98%    Last Pain:  Vitals:   02/24/22 1716  TempSrc:   PainSc: 0-No pain                 Belenda Cruise P Avalie Oconnor

## 2022-02-24 NOTE — Anesthesia Procedure Notes (Signed)
Procedure Name: LMA Insertion Date/Time: 02/24/2022 2:50 PM Performed by: Claudia Desanctis, CRNA Pre-anesthesia Checklist: Emergency Drugs available, Patient identified, Suction available and Patient being monitored Patient Re-evaluated:Patient Re-evaluated prior to induction Oxygen Delivery Method: Circle system utilized Preoxygenation: Pre-oxygenation with 100% oxygen Induction Type: IV induction Ventilation: Mask ventilation without difficulty LMA: LMA inserted LMA Size: 4.0 Number of attempts: 1 Placement Confirmation: positive ETCO2 and breath sounds checked- equal and bilateral Tube secured with: Tape Dental Injury: Teeth and Oropharynx as per pre-operative assessment

## 2022-02-24 NOTE — H&P (Signed)
Anesthesia H&P Update: History and Physical Exam reviewed; patient is OK for planned anesthetic and procedure. ? ?

## 2022-02-24 NOTE — Interval H&P Note (Signed)
History and Physical Interval Note:  02/24/2022 2:36 PM  Kerri Mills  has presented today for surgery, with the diagnosis of hammer toe.  The various methods of treatment have been discussed with the patient and family. After consideration of risks, benefits and other options for treatment, the patient has consented to  Procedure(s): HAMMER TOE CORRECTION 2, 3, 4 Right and 2nd Left (Bilateral) as a surgical intervention.  The patient's history has been reviewed, patient examined, no change in status, stable for surgery.  I have reviewed the patient's chart and labs.  Questions were answered to the patient's satisfaction.     Edrick Kins

## 2022-02-24 NOTE — Transfer of Care (Signed)
Immediate Anesthesia Transfer of Care Note  Patient: Kerri Mills  Procedure(s) Performed: HAMMER TOE CORRECTION 2, 3, 4 Right and 2nd Left (Bilateral: Toe)  Patient Location: PACU  Anesthesia Type:General  Level of Consciousness: awake, alert  and patient cooperative  Airway & Oxygen Therapy: Patient Spontanous Breathing and Patient connected to face mask  Post-op Assessment: Report given to RN and Post -op Vital signs reviewed and stable  Post vital signs: Reviewed and stable  Last Vitals:  Vitals Value Taken Time  BP    Temp    Pulse    Resp    SpO2      Last Pain:  Vitals:   02/24/22 1233  TempSrc:   PainSc: 0-No pain      Patients Stated Pain Goal: 4 (44/46/19 0122)  Complications: No notable events documented.

## 2022-02-24 NOTE — Brief Op Note (Signed)
02/24/2022  4:32 PM  PATIENT:  Kerri Mills  88 y.o. female  PRE-OPERATIVE DIAGNOSIS:  hammer toe  POST-OPERATIVE DIAGNOSIS:  hammer toe  PROCEDURE:  Procedure(s): HAMMER TOE CORRECTION 2, 3, 4 Right and 2nd Left (Bilateral) Weil shortening osteotomy second metatarsal bilateral  SURGEON:  Surgeon(s) and Role:    Edrick Kins, DPM - Primary  PHYSICIAN ASSISTANT: none  ASSISTANTS: none   ANESTHESIA:   local and general  EBL:  39m  BLOOD ADMINISTERED:none  DRAINS: none   LOCAL MEDICATIONS USED:  MARCAINE   , LIDOCAINE , and Amount: 20 ml bilateral  SPECIMEN:  No Specimen  DISPOSITION OF SPECIMEN:  N/A  COUNTS:  YES  TOURNIQUET:   Total Tourniquet Time Documented: Ankle (Right) - 47 minutes Total: Ankle (Right) - 47 minutes  Ankle (Left) - 25 minutes Total: Ankle (Left) - 25 minutes   DICTATION: .DViviann SpareDictation  PLAN OF CARE: Discharge to home after PACU  PATIENT DISPOSITION:  PACU - hemodynamically stable.   Delay start of Pharmacological VTE agent (>24hrs) due to surgical blood loss or risk of bleeding: not applicable

## 2022-02-25 ENCOUNTER — Telehealth: Payer: Self-pay | Admitting: *Deleted

## 2022-02-25 ENCOUNTER — Encounter (HOSPITAL_COMMUNITY): Payer: Self-pay | Admitting: Podiatry

## 2022-02-25 ENCOUNTER — Encounter: Payer: PPO | Admitting: Podiatry

## 2022-02-25 NOTE — Telephone Encounter (Signed)
"  I'm calling regarding my mother.  She had surgery yesterday.  He said he would call my mother today on my cell number.  We do have some questions.  She had quite a bit of bleeding.  We're certainly not going to unwrap her feet.  We do have questions."

## 2022-02-28 ENCOUNTER — Encounter: Payer: Self-pay | Admitting: Podiatry

## 2022-02-28 ENCOUNTER — Ambulatory Visit: Payer: PPO | Admitting: Podiatry

## 2022-02-28 DIAGNOSIS — Z9889 Other specified postprocedural states: Secondary | ICD-10-CM

## 2022-02-28 MED ORDER — OXYCODONE-ACETAMINOPHEN 5-325 MG PO TABS: 1.0000 | ORAL_TABLET | ORAL | 0 refills | Status: DC | PRN

## 2022-02-28 NOTE — Telephone Encounter (Signed)
Spoke with patient and daughter over the weekend.  They are coming in today for dressing change.

## 2022-02-28 NOTE — Progress Notes (Signed)
   Subjective:  Patient presents today status post bilateral hammertoe repair 2, 3, 4 RT.  2 LT.. DOS: 02/24/2022.  Patient states that she is doing well.  She is requesting a dressing change today.  I spoke with the patient over the weekend and there was some strikethrough on the bandages.  Presenting for dressing change and medication refill  Past Medical History:  Diagnosis Date   Allergy    Anxiety    Arthritis    Atypical chest pain    a. 10/2018 MV: small, fixed apical defect possibly 2/2 attenuation artifact. No ischemia.  EF 68%.   Clotting disorder ([place])    Colitis    Depression    Diverticulitis 2013   Dysrhythmia    Gastric ulcer    GERD (gastroesophageal reflux disease)    History of echocardiogram    a. 09/2019 Echo: EF 55-60%, mod LVH. Mildly dil LA. Triv MR/TR.   Hypercholesterolemia    Hypertension    Hypothyroidism    Infiltrating lobular carcinoma of left breast 2011   T2,N0, ER: 90%; PR 0%; Her 2 neu not amplified. [facility]).   Melanoma ([place]) 1997   Melanoma in situ of upper extremity ([place]) 03/19/2011   Persistent atrial fibrillation ([place])    a. CHADS2VASc => 4 (HTN, age x 2, female)   Personal history of radiation therapy 2011   BREAST CA   Presence of permanent cardiac pacemaker    Seroma    HISTORY OF LFT BREAST   Sleep apnea    Thyroid cancer ([place]) 1992      Objective/Physical Exam Neurovascular status intact.  Skin incisions appear to be well coapted with sutures intact. No sign of infectious process noted. No dehiscence. No active bleeding noted. Moderate edema noted to the surgical extremity.  Percutaneous fixation pins are intact and stable  Assessment: 1. s/p hammertoe repair 2, 3, 4 RT.  2 LT.. DOS: 02/24/2022   Plan of Care:  1. Patient was evaluated.  2.  Dressings changed.  Clean dry and intact until next scheduled appointment this Friday 3.  New postsurgical shoes were dispensed today.  Weightbearing as tolerated 4.  Note for  meal delivery provided today.  Medically necessary to have meals delivered to her room at the assisted living facility where she resides 5.  Refill prescription for Percocet 5/325 mg #20  6.  Return to clinic next scheduled appointment 03/04/2022   Edrick Kins, DPM [facility]  Dr. Edrick Kins, DPM    [address], [place] [address]                Office [phone]  Fax [phone]

## 2022-03-02 ENCOUNTER — Ambulatory Visit: Payer: PPO | Admitting: Internal Medicine

## 2022-03-02 NOTE — Telephone Encounter (Signed)
Called and spoke with patient.   Advised patient that I would fill out what I could on the application, but we would need her to fill out and sign the patient portion. Advised patient that we also need proof of income and her our-of-pocket prescription expenses for the application.   Pt verbalized understanding. Asked that I mail the application to her as she just had surgery and is still recovering. Advised patient that I would do so and if she has any questions about the application while filling it out, that she could call.   Pt verbalized understanding and voiced appreciation for the call.   Application filled out and signed by MD, patient medications and allergies printed and attached, patient's insurance card printed and attached.   Application mailed to patient.

## 2022-03-04 ENCOUNTER — Telehealth: Payer: Self-pay | Admitting: *Deleted

## 2022-03-04 ENCOUNTER — Encounter: Payer: PPO | Admitting: Podiatry

## 2022-03-04 ENCOUNTER — Ambulatory Visit: Payer: PPO

## 2022-03-04 ENCOUNTER — Ambulatory Visit (INDEPENDENT_AMBULATORY_CARE_PROVIDER_SITE_OTHER): Payer: PPO | Admitting: Podiatry

## 2022-03-04 ENCOUNTER — Encounter: Payer: Self-pay | Admitting: Podiatry

## 2022-03-04 ENCOUNTER — Ambulatory Visit (INDEPENDENT_AMBULATORY_CARE_PROVIDER_SITE_OTHER): Payer: PPO

## 2022-03-04 DIAGNOSIS — Z9889 Other specified postprocedural states: Secondary | ICD-10-CM

## 2022-03-04 MED ORDER — DOXYCYCLINE HYCLATE 100 MG PO TABS: 100.0000 mg | ORAL_TABLET | Freq: Two times a day (BID) | ORAL | 0 refills | Status: DC

## 2022-03-04 MED ORDER — OXYCODONE-ACETAMINOPHEN 5-325 MG PO TABS: 1.0000 | ORAL_TABLET | ORAL | 0 refills | Status: DC | PRN

## 2022-03-04 NOTE — Progress Notes (Signed)
   Subjective:  Patient presents today status post hammertoe repair digits 2, 3, 4 right foot as well as the second digit left foot. DOS: 02/24/2022.  Patient states that she is doing well.  She has kept the dressings clean and dry.  She has also been weightbearing in the surgical shoe.  No new complaints at this time  Past Medical History:  Diagnosis Date   Allergy    Anxiety    Arthritis    Atypical chest pain    a. 10/2018 MV: small, fixed apical defect possibly 2/2 attenuation artifact. No ischemia.  EF 68%.   Clotting disorder ([place])    Colitis    Depression    Diverticulitis 2013   Dysrhythmia    Gastric ulcer    GERD (gastroesophageal reflux disease)    History of echocardiogram    a. 09/2019 Echo: EF 55-60%, mod LVH. Mildly dil LA. Triv MR/TR.   Hypercholesterolemia    Hypertension    Hypothyroidism    Infiltrating lobular carcinoma of left breast 2011   T2,N0, ER: 90%; PR 0%; Her 2 neu not amplified. [facility]).   Melanoma ([place]) 1997   Melanoma in situ of upper extremity ([place]) 03/19/2011   Persistent atrial fibrillation ([place])    a. CHADS2VASc => 4 (HTN, age x 2, female)   Personal history of radiation therapy 2011   BREAST CA   Presence of permanent cardiac pacemaker    Seroma    HISTORY OF LFT BREAST   Sleep apnea    Thyroid cancer ([place]) 1992      Objective/Physical Exam Neurovascular status intact.  Skin incisions appear to be well coapted with sutures intact. No sign of infectious process noted. No dehiscence. No active bleeding noted. Moderate edema noted to the surgical extremity with some mild localized erythema around the area.  Radiographic Exam:  Orthopedic hardware and osteotomies sites appear to be stable with routine healing.  Good alignment of the digits with intact percutaneous fixation pins  Assessment: 1. s/p hammertoe repair 2, 3, 4 RT.  2 LT. DOS: 02/24/2022   Plan of Care:  1. Patient was evaluated. X-rays reviewed 2.  Dressings  changed.  Clean dry and intact x1 week 3.  Continue minimal weightbearing in the postsurgical shoes as tolerated 4.  Refill prescription for Percocet 5/325 mg every 4 hours as needed pain #20 5.  Due to the slight erythema around the surgical area I did call in a prescription for some doxycycline 100 mg 2 times daily #20.  There is also some slight green drainage on the dressings today 6.  Return to clinic 1 week   Edrick Kins, DPM [facility]  Dr. Edrick Kins, DPM    [address], [place] [address]                Office [phone]  Fax [phone]

## 2022-03-04 NOTE — Telephone Encounter (Signed)
"  I'm calling for my mother.  She saw Dr. Amalia Hailey this morning for a follow-up visit.  He was to call in an antibiotic for her and Oxycodone.  [facility]  I had called your office and they said yes, Dr. Amalia Hailey had called it in.  I'm not sure what to do at this point and you're probably not going to get this message.  What are we supposed to do?"  "I'm calling again regarding Kerri Mills.  Please disregard my earlier message concerning [facility].  I checked with [facility] which is mama's primary pharmacy.  They do have the two prescriptions.  I just assumed that these two would have been [facility] the [facility], my bad.  I apologize.  So please ignore that lengthy message I left."

## 2022-03-09 ENCOUNTER — Other Ambulatory Visit: Payer: Self-pay | Admitting: Internal Medicine

## 2022-03-11 ENCOUNTER — Other Ambulatory Visit: Payer: Self-pay | Admitting: Internal Medicine

## 2022-03-11 ENCOUNTER — Ambulatory Visit (INDEPENDENT_AMBULATORY_CARE_PROVIDER_SITE_OTHER): Payer: PPO | Admitting: Podiatry

## 2022-03-11 DIAGNOSIS — Z9889 Other specified postprocedural states: Secondary | ICD-10-CM

## 2022-03-11 NOTE — Progress Notes (Signed)
   Subjective:  Patient presents today status post hammertoe repair digits 2, 3, 4 right foot as well as the second digit left foot. DOS: 02/24/2022.  Patient continues to do well.  She has been weightbearing in the postsurgical shoes.  No new complaints at this time  Past Medical History:  Diagnosis Date   Allergy    Anxiety    Arthritis    Atypical chest pain    a. 10/2018 MV: small, fixed apical defect possibly 2/2 attenuation artifact. No ischemia.  EF 68%.   Clotting disorder ([place])    Colitis    Depression    Diverticulitis 2013   Dysrhythmia    Gastric ulcer    GERD (gastroesophageal reflux disease)    History of echocardiogram    a. 09/2019 Echo: EF 55-60%, mod LVH. Mildly dil LA. Triv MR/TR.   Hypercholesterolemia    Hypertension    Hypothyroidism    Infiltrating lobular carcinoma of left breast 2011   T2,N0, ER: 90%; PR 0%; Her 2 neu not amplified. [facility]).   Melanoma ([place]) 1997   Melanoma in situ of upper extremity ([place]) 03/19/2011   Persistent atrial fibrillation ([place])    a. CHADS2VASc => 4 (HTN, age x 2, female)   Personal history of radiation therapy 2011   BREAST CA   Presence of permanent cardiac pacemaker    Seroma    HISTORY OF LFT BREAST   Sleep apnea    Thyroid cancer ([place]) 1992      Objective/Physical Exam Neurovascular status intact.  Skin incisions appear to be well coapted with sutures intact. No sign of infectious process noted. No dehiscence. No active bleeding noted.  Edema appears significantly improved.  No erythema noted.  Well-healing surgical foot  Radiographic Exam 03/04/2022 bilateral:  Orthopedic hardware and osteotomies sites appear to be stable with routine healing.  Good alignment of the digits with intact percutaneous fixation pins  Assessment: 1. s/p hammertoe repair 2, 3, 4 RT.  2 LT. DOS: 02/24/2022   Plan of Care:  1. Patient was evaluated.  2.  Sutures removed today 3.  Overall the feet appear to be healing nicely.   Patient may begin washing and showering and getting the foot wet.  Patient has a shower chair.  Leave percutaneous pins intact times additional 3 weeks 4.  Continue postsurgical shoes 5.  Return to clinic in 3 weeks for percutaneous pin removal  Edrick Kins, DPM [facility]  Dr. Edrick Kins, DPM    [address], [place] [address]                Office [phone]  Fax [phone]

## 2022-03-18 ENCOUNTER — Encounter: Payer: PPO | Admitting: Podiatry

## 2022-03-18 ENCOUNTER — Other Ambulatory Visit (INDEPENDENT_AMBULATORY_CARE_PROVIDER_SITE_OTHER): Payer: PPO

## 2022-03-18 DIAGNOSIS — E7800 Pure hypercholesterolemia, unspecified: Secondary | ICD-10-CM

## 2022-03-18 DIAGNOSIS — R739 Hyperglycemia, unspecified: Secondary | ICD-10-CM | POA: Diagnosis not present

## 2022-03-18 DIAGNOSIS — I10 Essential (primary) hypertension: Secondary | ICD-10-CM | POA: Diagnosis not present

## 2022-03-18 LAB — HEPATIC FUNCTION PANEL
ALT: 13 U/L (ref 0–35)
AST: 21 U/L (ref 0–37)
Albumin: 3.7 g/dL (ref 3.5–5.2)
Alkaline Phosphatase: 165 U/L — ABNORMAL HIGH (ref 39–117)
Bilirubin, Direct: 0.1 mg/dL (ref 0.0–0.3)
Total Bilirubin: 0.5 mg/dL (ref 0.2–1.2)
Total Protein: 6.1 g/dL (ref 6.0–8.3)

## 2022-03-18 LAB — LIPID PANEL
Cholesterol: 142 mg/dL (ref 0–200)
HDL: 38 mg/dL — ABNORMAL LOW (ref >39.00)
LDL Cholesterol: 69 mg/dL (ref 0–99)
NonHDL: 104.16
Total CHOL/HDL Ratio: 4
Triglycerides: 175 mg/dL — ABNORMAL HIGH (ref 0.0–149.0)
VLDL: 35 mg/dL (ref 0.0–40.0)

## 2022-03-18 LAB — BASIC METABOLIC PANEL
BUN: 12 mg/dL (ref 6–23)
CO2: 30 mEq/L (ref 19–32)
Calcium: 9.8 mg/dL (ref 8.4–10.5)
Chloride: 103 mEq/L (ref 96–112)
Creatinine, Ser: 0.81 mg/dL (ref 0.40–1.20)
GFR: 64.67 mL/min (ref >60.00)
Glucose, Bld: 116 mg/dL — ABNORMAL HIGH (ref 70–99)
Potassium: 4.1 mEq/L (ref 3.5–5.1)
Sodium: 140 mEq/L (ref 135–145)

## 2022-03-18 LAB — HEMOGLOBIN A1C: Hgb A1c MFr Bld: 6.4 % (ref 4.6–6.5)

## 2022-03-21 ENCOUNTER — Telehealth: Payer: Self-pay | Admitting: *Deleted

## 2022-03-23 ENCOUNTER — Telehealth: Payer: Self-pay | Admitting: Internal Medicine

## 2022-03-23 ENCOUNTER — Encounter: Payer: Self-pay | Admitting: Internal Medicine

## 2022-03-23 ENCOUNTER — Ambulatory Visit (INDEPENDENT_AMBULATORY_CARE_PROVIDER_SITE_OTHER): Payer: PPO | Admitting: Internal Medicine

## 2022-03-23 ENCOUNTER — Telehealth: Payer: Self-pay | Admitting: *Deleted

## 2022-03-23 VITALS — BP 130/70 | HR 53 | Temp 98.0°F | Resp 21 | Ht 65.0 in | Wt 169.2 lb

## 2022-03-23 DIAGNOSIS — E7800 Pure hypercholesterolemia, unspecified: Secondary | ICD-10-CM

## 2022-03-23 DIAGNOSIS — I10 Essential (primary) hypertension: Secondary | ICD-10-CM | POA: Diagnosis not present

## 2022-03-23 DIAGNOSIS — R748 Abnormal levels of other serum enzymes: Secondary | ICD-10-CM

## 2022-03-23 DIAGNOSIS — E039 Hypothyroidism, unspecified: Secondary | ICD-10-CM

## 2022-03-23 DIAGNOSIS — I4891 Unspecified atrial fibrillation: Secondary | ICD-10-CM

## 2022-03-23 DIAGNOSIS — Z853 Personal history of malignant neoplasm of breast: Secondary | ICD-10-CM

## 2022-03-23 DIAGNOSIS — D649 Anemia, unspecified: Secondary | ICD-10-CM

## 2022-03-23 DIAGNOSIS — Z85850 Personal history of malignant neoplasm of thyroid: Secondary | ICD-10-CM

## 2022-03-23 DIAGNOSIS — K219 Gastro-esophageal reflux disease without esophagitis: Secondary | ICD-10-CM

## 2022-03-23 DIAGNOSIS — J479 Bronchiectasis, uncomplicated: Secondary | ICD-10-CM

## 2022-03-23 DIAGNOSIS — F419 Anxiety disorder, unspecified: Secondary | ICD-10-CM | POA: Diagnosis not present

## 2022-03-23 DIAGNOSIS — L03818 Cellulitis of other sites: Secondary | ICD-10-CM

## 2022-03-23 DIAGNOSIS — R0602 Shortness of breath: Secondary | ICD-10-CM

## 2022-03-23 DIAGNOSIS — I495 Sick sinus syndrome: Secondary | ICD-10-CM | POA: Diagnosis not present

## 2022-03-23 DIAGNOSIS — I700 Atherosclerosis of aorta: Secondary | ICD-10-CM | POA: Diagnosis not present

## 2022-03-23 DIAGNOSIS — L0390 Cellulitis, unspecified: Secondary | ICD-10-CM | POA: Insufficient documentation

## 2022-03-23 DIAGNOSIS — G4733 Obstructive sleep apnea (adult) (pediatric): Secondary | ICD-10-CM

## 2022-03-23 DIAGNOSIS — R739 Hyperglycemia, unspecified: Secondary | ICD-10-CM

## 2022-03-23 MED ORDER — DOXYCYCLINE HYCLATE 100 MG PO TABS: 100.0000 mg | ORAL_TABLET | Freq: Two times a day (BID) | ORAL | 0 refills | Status: DC

## 2022-03-23 NOTE — Telephone Encounter (Signed)
Please call and notify pt that I have left message at podiatry regarding her foot.  She was correct, Dr Amalia Hailey is not in the office, but I have asked for someone to call me back from his office.  Just waiting to hear.  Take the abx as we discussed and if any change or worsening problems, she is to be evaluated.

## 2022-03-23 NOTE — Assessment & Plan Note (Signed)
On thyroid replacement.  Follow tsh.  

## 2022-03-23 NOTE — Assessment & Plan Note (Signed)
On metoprolol and lasix prn.   Blood pressure as outlined.  Continue current medication regimen. Hold on making changes.   Follow pressures.  Follow metabolic panel.  

## 2022-03-23 NOTE — Assessment & Plan Note (Signed)
Follow cbc.  

## 2022-03-23 NOTE — Assessment & Plan Note (Signed)
On lovastatin 

## 2022-03-23 NOTE — Assessment & Plan Note (Signed)
Elevated on recent check.  Feel related to recent surgery.  Follow.

## 2022-03-23 NOTE — Assessment & Plan Note (Signed)
Seeing Dr Rockey Situ and EP.  On metoprolol '75mg'$  bid.  Follow.

## 2022-03-23 NOTE — Telephone Encounter (Signed)
I called patient and let her know that Dr. Nicki Reaper waiting to hear back from Dr. Amalia Hailey office. Pt was okay for now & will continue abx as advised.

## 2022-03-23 NOTE — Patient Instructions (Signed)
Take a probiotic daily while on the antibiotic and for two weeks after completing the antibiotic.

## 2022-03-23 NOTE — Assessment & Plan Note (Signed)
Low carb diet and exercise.  Follow met b and a1c.

## 2022-03-23 NOTE — Assessment & Plan Note (Signed)
Redness and tenderness - top of right foot - s/p surgery.  Changes appear to be c/w cellulitis.  Will treat with doxycycline.  Elevated when sitting.  Contact podiatry for f/u.  Will need close f/u.  If any changes, she is to be evaluated.

## 2022-03-23 NOTE — Assessment & Plan Note (Signed)
Upper symptoms appear to be controlled on omeprazole.  

## 2022-03-23 NOTE — Assessment & Plan Note (Signed)
01/10/22 - Birads I.  Evaluated by Dr Byrnett 01/20/22 - recommended yearly f/u - Dr Cintron-Diaz.  

## 2022-03-23 NOTE — Assessment & Plan Note (Signed)
Saw Dr Mortimer Fries with recommendations: Bronchiectasis without complication ([place]). Continue flutter valve as tolerated. Repeat CT chest in 3 months Restart Flutter valve Three times a day  .  Mucinex Twice daily  As needed  cough/congestion  Albuterol inhaler 1-2 puffs every 6hr as needed for wheezing /shortness of breath.  Breathing overall stable.

## 2022-03-23 NOTE — Assessment & Plan Note (Signed)
Continues on cymbalta. Stable.  

## 2022-03-23 NOTE — Progress Notes (Signed)
Patient ID: Kerri Mills, female   DOB: 1931-09-19, 88 y.o.   MRN: [id number]   Subjective:    Patient ID: Kerri Mills, female    DOB: 10-Jun-1931, 88 y.o.   MRN: [id number]   Patient here for a scheduled follow up.   Chief Complaint  Patient presents with   Follow-up    10 week follow up   .   HPI Here to follow up regarding hypercholesterolemia, afib and hypertension.  Also, recent foot surgery.  Concerned regarding increased pain and redness - right foot.  Seeing Dr Amalia Hailey.  Surgery 02/24/22 (right 2,3 and 4) and (left 2).  Pins in place right.  Removed left.  She has been walking and was questioning if has been on her feet "too much".  States otherwise things are relatively stable.  No chest pain.  No increased heart rate or palpitations.  Breathing overall stable.  No increased cough or congestion.  No abdominal pain.  Bowels moving.     Past Medical History:  Diagnosis Date   Allergy    Anxiety    Arthritis    Atypical chest pain    a. 10/2018 MV: small, fixed apical defect possibly 2/2 attenuation artifact. No ischemia.  EF 68%.   Clotting disorder ([place])    Colitis    Depression    Diverticulitis 2013   Dysrhythmia    Gastric ulcer    GERD (gastroesophageal reflux disease)    History of echocardiogram    a. 09/2019 Echo: EF 55-60%, mod LVH. Mildly dil LA. Triv MR/TR.   Hypercholesterolemia    Hypertension    Hypothyroidism    Infiltrating lobular carcinoma of left breast 2011   T2,N0, ER: 90%; PR 0%; Her 2 neu not amplified. [facility]).   Melanoma ([place]) 1997   Melanoma in situ of upper extremity ([place]) 03/19/2011   Persistent atrial fibrillation ([place])    a. CHADS2VASc => 4 (HTN, age x 2, female)   Personal history of radiation therapy 2011   BREAST CA   Presence of permanent cardiac pacemaker    Seroma    HISTORY OF LFT BREAST   Sleep apnea    Thyroid cancer ([place]) 1992   Past Surgical History:  Procedure Laterality Date   ABDOMINAL HYSTERECTOMY  1973    partial   BREAST BIOPSY Left 02-13-13   BENIGN BREAST TISSUE WITH CHANGES CONSISTENT WITH FAT NECROSIS   BREAST BIOPSY Left 01/21/2015   bx done in [facility] office 11:00 left 6-8cmfn   BREAST EXCISIONAL BIOPSY Left 1995   neg   BREAST EXCISIONAL BIOPSY Left 2011   Breast cancer radiation   BREAST LUMPECTOMY Left 2011   BREAST CA   CARDIAC CATHETERIZATION     CHOLECYSTECTOMY     COLONOSCOPY  2013   COLONOSCOPY WITH PROPOFOL N/A 06/09/2021   Procedure: COLONOSCOPY WITH PROPOFOL;  Surgeon: Jonathon Bellows, MD;  Location: [facility] ENDOSCOPY;  Service: Gastroenterology;  Laterality: N/A;   HAMMER TOE SURGERY Bilateral 02/24/2022   Procedure: HAMMER TOE CORRECTION 2, 3, 4 Right and 2nd Left;  Surgeon: Edrick Kins, DPM;  Location: [facility] ORS;  Service: Podiatry;  Laterality: Bilateral;   MELANOMA EXCISION     RT UPPER ARM   PACEMAKER IMPLANT N/A 01/10/2020   Procedure: PACEMAKER IMPLANT;  Surgeon: Constance Haw, MD;  Location: [place] CV LAB;  Service: Cardiovascular;  Laterality: N/A;   PARTIAL HYSTERECTOMY     bleeding, ovaries in place.     THYROID  SURGERY  1992   FOR THYROID CANCER   TONSILLECTOMY     Family History  Problem Relation Age of Onset   Heart disease Mother    Cancer Brother        lung    Cancer Sister        breast   Breast cancer Neg Hx    Social History   Socioeconomic History   Marital status: Widowed    Spouse name: Not on file   Number of children: Not on file   Years of education: Not on file   Highest education level: Not on file  Occupational History   Not on file  Tobacco Use   Smoking status: Never   Smokeless tobacco: Never   Tobacco comments:    never  Vaping Use   Vaping Use: Never used  Substance and Sexual Activity   Alcohol use: Yes    Comment: once in a while   Drug use: No   Sexual activity: Never  Other Topics Concern   Not on file  Social History Narrative   Independent and baseline. Lives by herself   Social Determinants of  Health   Financial Resource Strain: Low Risk  (02/08/2022)   Overall Financial Resource Strain (CARDIA)    Difficulty of Paying Living Expenses: Not hard at all  Food Insecurity: No Food Insecurity (02/08/2022)   Hunger Vital Sign    Worried About Running Out of Food in the Last Year: Never true    [place] in the Last Year: Never true  Transportation Needs: No Transportation Needs (02/08/2022)   PRAPARE - Hydrologist (Medical): No    Lack of Transportation (Non-Medical): No  Physical Activity: Sufficiently Active (02/08/2022)   Exercise Vital Sign    Days of Exercise per Week: 4 days    Minutes of Exercise per Session: 40 min  Stress: No Stress Concern Present (02/08/2022)   [place]    Feeling of Stress : Only a little  Social Connections: Unknown (02/08/2022)   Social Connection and Isolation Panel [NHANES]    Frequency of Communication with Friends and Family: More than three times a week    Frequency of Social Gatherings with Friends and Family: More than three times a week    Attends Religious Services: Not on file    Active Member of Clubs or Organizations: Not on file    Attends Archivist Meetings: Not on file    Marital Status: Widowed     Review of Systems  Constitutional:  Negative for appetite change, fever and unexpected weight change.  HENT:  Negative for congestion and sinus pressure.   Respiratory:  Negative for cough and chest tightness.        Breathing stable.   Cardiovascular:  Negative for chest pain and palpitations.       Right foot swelling.  No swelling extending up the leg.   Gastrointestinal:  Negative for abdominal pain, diarrhea, nausea and vomiting.  Genitourinary:  Negative for difficulty urinating and dysuria.  Musculoskeletal:  Negative for myalgias.       Foot pain and redness - and swelling - top of foot.   Neurological:  Negative  for dizziness, light-headedness and headaches.  Psychiatric/Behavioral:  Negative for agitation and dysphoric mood.        Objective:     BP 130/70 (BP Location: Right Arm, Patient Position: Sitting, Cuff Size: Normal)  Pulse (!) 53   Temp 98 F (36.7 C) (Oral)   Resp (!) 21   Ht 5' 5" (1.651 m)   Wt 169 lb 3.2 oz (76.7 kg)   SpO2 98%   BMI 28.16 kg/m  Wt Readings from Last 3 Encounters:  03/23/22 169 lb 3.2 oz (76.7 kg)  02/24/22 167 lb (75.8 kg)  02/17/22 167 lb (75.8 kg)    Physical Exam Vitals reviewed.  Constitutional:      General: She is not in acute distress.    Appearance: Normal appearance.  HENT:     Head: Normocephalic and atraumatic.     Right Ear: External ear normal.     Left Ear: External ear normal.  Eyes:     General: No scleral icterus.       Right eye: No discharge.        Left eye: No discharge.     Conjunctiva/sclera: Conjunctivae normal.  Neck:     Thyroid: No thyromegaly.  Cardiovascular:     Rate and Rhythm: Normal rate and regular rhythm.  Pulmonary:     Effort: No respiratory distress.     Breath sounds: Normal breath sounds. No wheezing.  Abdominal:     General: Bowel sounds are normal.     Palpations: Abdomen is soft.     Tenderness: There is no abdominal tenderness.  Musculoskeletal:     Cervical back: Neck supple. No tenderness.     Comments: Right foot swelling - with erythema - top of right foot and increased tenderness to palpation.   Lymphadenopathy:     Cervical: No cervical adenopathy.  Skin:    Findings: No erythema or rash.  Neurological:     Mental Status: She is alert.  Psychiatric:        Mood and Affect: Mood normal.        Behavior: Behavior normal.      Outpatient Encounter Medications as of 03/23/2022  Medication Sig   acetaminophen (TYLENOL) 650 MG CR tablet Take 650 mg by mouth every 8 (eight) hours as needed for pain.   albuterol (VENTOLIN HFA) 108 (90 Base) MCG/ACT inhaler Inhale 2 puffs into the  lungs every 6 (six) hours as needed for wheezing or shortness of breath.   azelastine (ASTELIN) 0.1 % nasal spray Place 1 spray into both nostrils 2 (two) times daily. Use in each nostril as directed (Patient taking differently: Place 1 spray into both nostrils as needed. Use in each nostril as directed)   cholecalciferol (VITAMIN D3) 25 MCG (1000 UNIT) tablet Take 1,000 Units by mouth daily.   diphenhydrAMINE (BENADRYL) 25 MG tablet Take 25-75 mg by mouth 3 (three) times daily as needed for allergies.   DULoxetine (CYMBALTA) 60 MG capsule TAKE 1 CAPSULE BY MOUTH AT BEDTIME.   ELIQUIS 5 MG TABS tablet TAKE 1 TABLET BY MOUTH TWICE A DAY   furosemide (LASIX) 20 MG tablet TAKE 1 TABLET BY MOUTH DAILY AS NEEDED. TAKE WITH POTASSIUM.   gabapentin (NEURONTIN) 300 MG capsule TAKE 2 CAPSULES BY MOUTH AT BEDTIME   levothyroxine (SYNTHROID) 100 MCG tablet TAKE 1 TABLET EVERY DAY ON EMPTY STOMACHWITH A GLASS OF WATER AT LEAST 30-60 MINBEFORE BREAKFAST   lovastatin (MEVACOR) 40 MG tablet TAKE 1 TABLET BY MOUTH DAILY   metoprolol tartrate (LOPRESSOR) 50 MG tablet Take 75 mg by mouth 2 (two) times daily.   Multiple Vitamins-Minerals (PRESERVISION AREDS 2+MULTI VIT PO) Take 1 tablet by mouth 2 (two) times daily.   omeprazole (  PRILOSEC) 20 MG capsule Take 1 capsule (20 mg total) by mouth daily.   oxyCODONE-acetaminophen (PERCOCET) 5-325 MG tablet Take 1 tablet by mouth every 4 (four) hours as needed for severe pain.   Polyethyl Glycol-Propyl Glycol (SYSTANE OP) Place 1 drop into both eyes daily as needed (for dry eyes).   polyethylene glycol powder (GLYCOLAX/MIRALAX) 17 GM/SCOOP powder Take 17 g by mouth daily.   potassium chloride (KLOR-CON) 10 MEQ tablet TAKE 2 TABLETS BY MOUTH DAILY AS NEEDED.WITH FUROSEMIDE.   Probiotic Product (ALIGN) 4 MG CAPS One capsule daily while on antibiotic and for two weeks after complete antibiotic (Patient taking differently: Take 1 capsule by mouth daily.)   triamcinolone cream  (KENALOG) 0.1 % Apply 1 application. topically 2 (two) times daily.   vitamin B-12 (CYANOCOBALAMIN) 1000 MCG tablet Take 1,000 mcg by mouth daily.   White Petrolatum-Mineral Oil (GENTEAL TEARS NIGHT-TIME OP) Place 1 application into both eyes at bedtime. Night time ointment 3.5g   doxycycline (VIBRA-TABS) 100 MG tablet Take 1 tablet (100 mg total) by mouth 2 (two) times daily.   [DISCONTINUED] doxycycline (VIBRA-TABS) 100 MG tablet Take 1 tablet (100 mg total) by mouth 2 (two) times daily. (Patient not taking: Reported on 03/23/2022)   No facility-administered encounter medications on file as of 03/23/2022.     Lab Results  Component Value Date   WBC 7.1 02/17/2022   HGB 13.9 02/17/2022   HCT 43.6 02/17/2022   PLT 209 02/17/2022   GLUCOSE 116 (H) 03/18/2022   CHOL 142 03/18/2022   TRIG 175.0 (H) 03/18/2022   HDL 38.00 (L) 03/18/2022   LDLDIRECT 90.0 03/15/2021   LDLCALC 69 03/18/2022   ALT 13 03/18/2022   AST 21 03/18/2022   NA 140 03/18/2022   K 4.1 03/18/2022   CL 103 03/18/2022   CREATININE 0.81 03/18/2022   BUN 12 03/18/2022   CO2 30 03/18/2022   TSH 1.39 05/12/2021   INR 1.5 (H) 11/21/2018   HGBA1C 6.4 03/18/2022       Assessment & Plan:   Problem List Items Addressed This Visit     Anemia    Follow cbc.       Anxiety    Continues on cymbalta. Stable.       Aortic atherosclerosis (HCC)    On lovastatin.       Atrial fibrillation [facility])    S/p pacemaker placement.  On eliquis and metoprolol.  Overall appears to be stable.      Bronchiectasis without complication (HCC)    Saw Dr Mortimer Fries with recommendations: Bronchiectasis without complication ([place]). Continue flutter valve as tolerated. Repeat CT chest in 3 months Restart Flutter valve Three times a day  .  Mucinex Twice daily  As needed  cough/congestion  Albuterol inhaler 1-2 puffs every 6hr as needed for wheezing /shortness of breath.  Breathing overall stable.       Cellulitis    Redness and tenderness -  top of right foot - s/p surgery.  Changes appear to be c/w cellulitis.  Will treat with doxycycline.  Elevated when sitting.  Contact podiatry for f/u.  Will need close f/u.  If any changes, she is to be evaluated.        Elevated alkaline phosphatase level    Elevated on recent check.  Feel related to recent surgery.  Follow.       Essential hypertension    On metoprolol and lasix prn.   Blood pressure as outlined.  Continue current medication regimen.  Hold on making changes.   Follow pressures.  Follow metabolic panel.       GERD    Upper symptoms appear to be controlled on omeprazole.       History of breast cancer - Primary    01/10/22 - Birads I.  Evaluated by Dr Bary Castilla 01/20/22 - recommended yearly f/u - Dr Windell Moment.       History of thyroid cancer    On thyroid replacement.  Follow tsh.       Hypercholesterolemia    On lovastatin.  Continue diet and exercise.  Follow fasting profile and liver panel.       Hyperglycemia    Low carb diet and exercise.  Follow met b and a1c.        Hypothyroid    On thyroid replacement.  Follow tsh.       Obstructive sleep apnea    Continue cpap.        SOB (shortness of breath)    Breathing stable. Continue cpap.  Continue inhalers and flutter valve.  Continue f/u with pulmonary.        Tachy-brady syndrome (HCC)    Seeing Dr Rockey Situ and EP.  On metoprolol 88m bid.  Follow.         CEinar Pheasant MD

## 2022-03-23 NOTE — Assessment & Plan Note (Signed)
On lovastatin.  Continue diet and exercise.  Follow fasting profile and liver panel.  

## 2022-03-23 NOTE — Assessment & Plan Note (Signed)
S/p pacemaker placement.  On eliquis and metoprolol.  Overall appears to be stable. 

## 2022-03-23 NOTE — Telephone Encounter (Signed)
"  I'm calling in regards to a mutual patient, Kerri Mills.  She's a patient of Dr. Amalia Hailey.  I don't know if anyone is covering for Dr. Amalia Hailey.  Basically she had recent surgery.  He has been following her.  I saw her at a regular appointment.  She's concerned that her right foot is red and swollen.  I started her on  antibiotics.  She has a follow-up appointment with him but it is a ways off.  So I didn't know if someone else could see her or needed to see her.  I just wanted to make sure someone was aware that this is going on since she had surgery.  I have a phone that comes straight to my desk.  It is [phone] there's a voicemail on there if you would like to leave a message with a patient.  If someone would call me back, I'd appreciate it."

## 2022-03-23 NOTE — Assessment & Plan Note (Signed)
Breathing stable. Continue cpap.  Continue inhalers and flutter valve.  Continue f/u with pulmonary.

## 2022-03-23 NOTE — Assessment & Plan Note (Signed)
Continue cpap.  

## 2022-03-25 ENCOUNTER — Telehealth: Payer: Self-pay | Admitting: Internal Medicine

## 2022-03-25 ENCOUNTER — Ambulatory Visit (INDEPENDENT_AMBULATORY_CARE_PROVIDER_SITE_OTHER): Payer: PPO | Admitting: Podiatry

## 2022-03-25 ENCOUNTER — Telehealth: Payer: Self-pay | Admitting: *Deleted

## 2022-03-25 ENCOUNTER — Telehealth: Payer: Self-pay | Admitting: Podiatry

## 2022-03-25 ENCOUNTER — Ambulatory Visit (INDEPENDENT_AMBULATORY_CARE_PROVIDER_SITE_OTHER): Payer: PPO

## 2022-03-25 DIAGNOSIS — Z9889 Other specified postprocedural states: Secondary | ICD-10-CM | POA: Diagnosis not present

## 2022-03-25 NOTE — Telephone Encounter (Signed)
Pt advised.

## 2022-03-25 NOTE — Telephone Encounter (Signed)
I am glad she is doing better. Please notify her that Dr Jacqualyn Posey is aware of her situation.  (This is one of Dr Amalia Hailey partners).  He is on call this week and weekend.  She apparently has an appt with Dr Amalia Hailey on Friday 04/01/22.  If she has any issues, they want her to call and let them know.

## 2022-03-25 NOTE — Telephone Encounter (Signed)
Dr Nicki Reaper called to let you know she left you a message on your personal cell number after hours yesterday about this mutual pt. States pt is not in any pain and advised pt that if anything gets worse over the weekend or before 7/7 (appt sch) she would have her to call to be seen sooner.

## 2022-03-25 NOTE — Telephone Encounter (Signed)
I attempted to call the patient to schedule her an appointment.  I left her a message to call and schedule an appointment then realized she's seeing Dr. Paulla Dolly today at 1:15 pm.

## 2022-03-25 NOTE — Telephone Encounter (Signed)
Please call Kerri Mills and see how her foot is doing.  Dr Amalia Hailey is not in the office, but I contacted their office to give update.  Please confirm doing ok and see if she has f/u scheduled with podiatry. I think they were going to try to work her in for earlier appt.

## 2022-03-25 NOTE — Telephone Encounter (Signed)
Spoke to patient's daughter advised that we could add patient to the schedule at 115pm with Dr. Paulla Dolly. Hilda Blades is not sure if they can make it her by than. Advised to come into the office before 2pm and she will be seen by the nurse or provider.

## 2022-03-25 NOTE — Telephone Encounter (Signed)
S/w pt - stated she is feeling better today. Has started medication, rested yesterday. Stayed off her feet. Has not heard from podiatry, but if she does not hear today she will call them.

## 2022-03-25 NOTE — Telephone Encounter (Signed)
Daughter is calling because patient has a possible foot infection,having chills and in a lot of pain,pins are 4 weeks out before removal. She wants to come in to have it looked at but no availability, advised by nurse(Tonika) to have patient to ER, daughter said that they will not be doing that.  She saw her PCP on Thursday and was given an antibiotic.

## 2022-03-27 NOTE — Progress Notes (Signed)
Subjective:   Patient ID: Kerri Mills, female   DOB: 88 y.o.   MRN: [id number]   HPI Patient presents with caregiver stating they are concerned about redness in the right foot after having had surgery approximately 4 weeks ago by Dr. Amalia Hailey.  She saw her family physician who put her on antibiotic and states it is some better than it was   ROS      Objective:  Physical Exam  Neurovascular status found to be intact negative Bevelyn Buckles' sign was noted with forefoot erythema on the right but it is low-grade and there is no breakdown of tissue noted no drainage noted with the incision sites healing well pins in place digits 234 right with  Good Alignment of the Lesser Digits     Assessment:  Appears to be responding well to antibiotic with probability for low-grade cellulitic localized condition with no current systemic inflammation or indications of infection     Plan:  H&P x-ray reviewed and I did go ahead and recommend that she continue the antibiotics continue reduced activity and elevation and see Dr. Amalia Hailey in 1 week for pin removal.  Strict instructions if further redness or any systemic signs of infection were to occur to go straight to the emergency room and patient is encouraged to call with questions concerns which may arise  X-rays indicate the pins are intact there is some secondary healing around the second metatarsal right but over time it should heal uneventfully with no other pathology

## 2022-04-01 ENCOUNTER — Ambulatory Visit (INDEPENDENT_AMBULATORY_CARE_PROVIDER_SITE_OTHER): Payer: PPO

## 2022-04-01 ENCOUNTER — Encounter: Payer: Self-pay | Admitting: Podiatry

## 2022-04-01 ENCOUNTER — Ambulatory Visit (INDEPENDENT_AMBULATORY_CARE_PROVIDER_SITE_OTHER): Payer: PPO | Admitting: Podiatry

## 2022-04-01 DIAGNOSIS — Z9889 Other specified postprocedural states: Secondary | ICD-10-CM

## 2022-04-01 DIAGNOSIS — M2041 Other hammer toe(s) (acquired), right foot: Secondary | ICD-10-CM

## 2022-04-01 DIAGNOSIS — M2042 Other hammer toe(s) (acquired), left foot: Secondary | ICD-10-CM

## 2022-04-01 NOTE — Progress Notes (Signed)
   Subjective:  Patient presents today status post hammertoe repair digits 2, 3, 4 right foot as well as the second digit left foot. DOS: 02/24/2022.  Patient doing well.  She has been weightbearing in the postsurgical shoes.  No new complaints at this time  Past Medical History:  Diagnosis Date   Allergy    Anxiety    Arthritis    Atypical chest pain    a. 10/2018 MV: small, fixed apical defect possibly 2/2 attenuation artifact. No ischemia.  EF 68%.   Clotting disorder ([place])    Colitis    Depression    Diverticulitis 2013   Dysrhythmia    Gastric ulcer    GERD (gastroesophageal reflux disease)    History of echocardiogram    a. 09/2019 Echo: EF 55-60%, mod LVH. Mildly dil LA. Triv MR/TR.   Hypercholesterolemia    Hypertension    Hypothyroidism    Infiltrating lobular carcinoma of left breast 2011   T2,N0, ER: 90%; PR 0%; Her 2 neu not amplified. [facility]).   Melanoma ([place]) 1997   Melanoma in situ of upper extremity ([place]) 03/19/2011   Persistent atrial fibrillation ([place])    a. CHADS2VASc => 4 (HTN, age x 2, female)   Personal history of radiation therapy 2011   BREAST CA   Presence of permanent cardiac pacemaker    Seroma    HISTORY OF LFT BREAST   Sleep apnea    Thyroid cancer ([place]) 1992       Objective/Physical Exam Neurovascular status intact.  Skin incisions healed.  There is some mild erythema with edema noted especially to the right foot along the second toe.  Please see above noted photo  Radiographic Exam 04/01/2022 bilateral:  Percutaneous fixation pins all removed today.  Overall decent alignment.  Well-healing arthroplasty sites  Assessment: 1. s/p hammertoe repair 2, 3, 4 RT.  2 LT. DOS: 02/24/2022   Plan of Care:  1. Patient was evaluated.  Patient recently completed oral antibiotics from PCP Dr. Nicki Reaper. 2.  Remaining percutaneous pins removed today from the second and fourth digit of the right foot 3.  Patient may begin to transition out of the  postsurgical shoes into good supportive sneakers 4.  Return to clinic 4 weeks  Edrick Kins, DPM [facility]  Dr. Edrick Kins, DPM    [address], [place] [address]                Office [phone]  Fax [phone]

## 2022-04-04 NOTE — Telephone Encounter (Signed)
Spoke with patient and  she states that she is doing well and advised per Dr. Amalia Hailey  to come back for follow-up appt in 5 weeks, patient verbalized understanding

## 2022-04-19 ENCOUNTER — Ambulatory Visit (INDEPENDENT_AMBULATORY_CARE_PROVIDER_SITE_OTHER): Payer: PPO

## 2022-04-19 ENCOUNTER — Other Ambulatory Visit: Payer: Self-pay | Admitting: Cardiovascular Disease

## 2022-04-19 DIAGNOSIS — I495 Sick sinus syndrome: Secondary | ICD-10-CM

## 2022-04-19 LAB — CUP PACEART REMOTE DEVICE CHECK
Battery Remaining Longevity: 84 mo
Battery Remaining Percentage: 80 %
Battery Voltage: 2.99 V
Brady Statistic AP VP Percent: 3.2 %
Brady Statistic AP VS Percent: 85 %
Brady Statistic AS VP Percent: 1 %
Brady Statistic AS VS Percent: 6.7 %
Brady Statistic RA Percent Paced: 80 %
Brady Statistic RV Percent Paced: 3.3 %
Date Time Interrogation Session: 20230725040020
Implantable Lead Implant Date: 20210416
Implantable Lead Implant Date: 20210416
Implantable Lead Location: 753859
Implantable Lead Location: 753860
Implantable Pulse Generator Implant Date: 20210416
Lead Channel Impedance Value: 350 Ohm
Lead Channel Impedance Value: 460 Ohm
Lead Channel Pacing Threshold Amplitude: 0.75 V
Lead Channel Pacing Threshold Amplitude: 1 V
Lead Channel Pacing Threshold Pulse Width: 0.5 ms
Lead Channel Pacing Threshold Pulse Width: 0.5 ms
Lead Channel Sensing Intrinsic Amplitude: 1 mV
Lead Channel Sensing Intrinsic Amplitude: 11.2 mV
Lead Channel Setting Pacing Amplitude: 2 V
Lead Channel Setting Pacing Amplitude: 2.5 V
Lead Channel Setting Pacing Pulse Width: 0.5 ms
Lead Channel Setting Sensing Sensitivity: 2 mV
Pulse Gen Model: 2272
Pulse Gen Serial Number: 3813093

## 2022-04-22 DIAGNOSIS — H353221 Exudative age-related macular degeneration, left eye, with active choroidal neovascularization: Secondary | ICD-10-CM | POA: Diagnosis not present

## 2022-05-03 ENCOUNTER — Ambulatory Visit (INDEPENDENT_AMBULATORY_CARE_PROVIDER_SITE_OTHER): Payer: PPO | Admitting: Podiatry

## 2022-05-03 DIAGNOSIS — Z9889 Other specified postprocedural states: Secondary | ICD-10-CM

## 2022-05-03 NOTE — Progress Notes (Signed)
   Chief Complaint  Patient presents with   POST OP right foot     Right foot post op     Subjective:  Patient presents today status post hammertoe repair digits 2, 3, 4 right foot as well as the second digit left foot. DOS: 02/24/2022.  Patient doing well.  Patient has been wearing regular shoes with no concern.  She has been full activity no restrictions.  Overall the patient is very satisfied.  Past Medical History:  Diagnosis Date   Allergy    Anxiety    Arthritis    Atypical chest pain    a. 10/2018 MV: small, fixed apical defect possibly 2/2 attenuation artifact. No ischemia.  EF 68%.   Clotting disorder ([place])    Colitis    Depression    Diverticulitis 2013   Dysrhythmia    Gastric ulcer    GERD (gastroesophageal reflux disease)    History of echocardiogram    a. 09/2019 Echo: EF 55-60%, mod LVH. Mildly dil LA. Triv MR/TR.   Hypercholesterolemia    Hypertension    Hypothyroidism    Infiltrating lobular carcinoma of left breast 2011   T2,N0, ER: 90%; PR 0%; Her 2 neu not amplified. [facility]).   Melanoma ([place]) 1997   Melanoma in situ of upper extremity ([place]) 03/19/2011   Persistent atrial fibrillation ([place])    a. CHADS2VASc => 4 (HTN, age x 2, female)   Personal history of radiation therapy 2011   BREAST CA   Presence of permanent cardiac pacemaker    Seroma    HISTORY OF LFT BREAST   Sleep apnea    Thyroid cancer ([place]) 1992     Objective/Physical Exam Neurovascular status intact.  Skin incisions healed.  Negative for any erythema or edema.  Well-healed surgical foot  Radiographic Exam 04/01/2022 bilateral:  Percutaneous fixation pins all removed today.  Overall decent alignment.  Well-healing arthroplasty sites  Assessment: 1. s/p hammertoe repair 2, 3, 4 RT.  2 LT. DOS: 02/24/2022   Plan of Care:  1. Patient was evaluated.   2.  Overall the patient is doing very well and wearing regular shoes.  She may now resume full activity no restrictions 3.   Advised against going barefoot 4.  Return to clinic as needed  Edrick Kins, DPM [facility]  Dr. Edrick Kins, DPM    [address], [place] [address]                Office [phone]  Fax [phone]

## 2022-05-05 ENCOUNTER — Ambulatory Visit
Admission: RE | Admit: 2022-05-05 | Discharge: 2022-05-05 | Disposition: A | Discharge status: Home / Self Care | Payer: PPO | Source: Ambulatory Visit | Attending: Internal Medicine | Admitting: Internal Medicine

## 2022-05-05 DIAGNOSIS — R918 Other nonspecific abnormal finding of lung field: Secondary | ICD-10-CM | POA: Diagnosis not present

## 2022-05-05 DIAGNOSIS — J479 Bronchiectasis, uncomplicated: Secondary | ICD-10-CM | POA: Diagnosis not present

## 2022-05-13 DIAGNOSIS — G250 Essential tremor: Secondary | ICD-10-CM | POA: Diagnosis not present

## 2022-05-13 DIAGNOSIS — R5383 Other fatigue: Secondary | ICD-10-CM | POA: Diagnosis not present

## 2022-05-13 DIAGNOSIS — E569 Vitamin deficiency, unspecified: Secondary | ICD-10-CM | POA: Diagnosis not present

## 2022-05-13 DIAGNOSIS — Z79899 Other long term (current) drug therapy: Secondary | ICD-10-CM | POA: Diagnosis not present

## 2022-05-13 DIAGNOSIS — R1310 Dysphagia, unspecified: Secondary | ICD-10-CM | POA: Diagnosis not present

## 2022-05-13 DIAGNOSIS — R252 Cramp and spasm: Secondary | ICD-10-CM | POA: Diagnosis not present

## 2022-05-13 DIAGNOSIS — E559 Vitamin D deficiency, unspecified: Secondary | ICD-10-CM | POA: Diagnosis not present

## 2022-05-16 NOTE — Progress Notes (Signed)
Remote pacemaker transmission.   

## 2022-05-17 ENCOUNTER — Other Ambulatory Visit: Payer: Self-pay | Admitting: Neurology

## 2022-05-17 ENCOUNTER — Other Ambulatory Visit: Payer: Self-pay | Admitting: Internal Medicine

## 2022-05-17 DIAGNOSIS — R1310 Dysphagia, unspecified: Secondary | ICD-10-CM

## 2022-05-18 ENCOUNTER — Ambulatory Visit: Payer: PPO | Admitting: Internal Medicine

## 2022-05-27 ENCOUNTER — Telehealth: Payer: Self-pay

## 2022-05-27 ENCOUNTER — Encounter: Payer: Self-pay | Admitting: Internal Medicine

## 2022-05-27 ENCOUNTER — Ambulatory Visit (INDEPENDENT_AMBULATORY_CARE_PROVIDER_SITE_OTHER): Payer: PPO | Admitting: Internal Medicine

## 2022-05-27 DIAGNOSIS — R739 Hyperglycemia, unspecified: Secondary | ICD-10-CM | POA: Diagnosis not present

## 2022-05-27 DIAGNOSIS — I495 Sick sinus syndrome: Secondary | ICD-10-CM

## 2022-05-27 DIAGNOSIS — I4891 Unspecified atrial fibrillation: Secondary | ICD-10-CM

## 2022-05-27 DIAGNOSIS — E039 Hypothyroidism, unspecified: Secondary | ICD-10-CM

## 2022-05-27 DIAGNOSIS — J479 Bronchiectasis, uncomplicated: Secondary | ICD-10-CM | POA: Diagnosis not present

## 2022-05-27 DIAGNOSIS — G4733 Obstructive sleep apnea (adult) (pediatric): Secondary | ICD-10-CM | POA: Diagnosis not present

## 2022-05-27 DIAGNOSIS — Z85850 Personal history of malignant neoplasm of thyroid: Secondary | ICD-10-CM

## 2022-05-27 DIAGNOSIS — I700 Atherosclerosis of aorta: Secondary | ICD-10-CM | POA: Diagnosis not present

## 2022-05-27 DIAGNOSIS — R251 Tremor, unspecified: Secondary | ICD-10-CM

## 2022-05-27 DIAGNOSIS — I10 Essential (primary) hypertension: Secondary | ICD-10-CM

## 2022-05-27 DIAGNOSIS — K219 Gastro-esophageal reflux disease without esophagitis: Secondary | ICD-10-CM | POA: Diagnosis not present

## 2022-05-27 DIAGNOSIS — E559 Vitamin D deficiency, unspecified: Secondary | ICD-10-CM | POA: Diagnosis not present

## 2022-05-27 DIAGNOSIS — F439 Reaction to severe stress, unspecified: Secondary | ICD-10-CM

## 2022-05-27 DIAGNOSIS — Z853 Personal history of malignant neoplasm of breast: Secondary | ICD-10-CM

## 2022-05-27 DIAGNOSIS — E7800 Pure hypercholesterolemia, unspecified: Secondary | ICD-10-CM

## 2022-05-27 DIAGNOSIS — R748 Abnormal levels of other serum enzymes: Secondary | ICD-10-CM

## 2022-05-27 DIAGNOSIS — R1310 Dysphagia, unspecified: Secondary | ICD-10-CM

## 2022-05-27 NOTE — Telephone Encounter (Signed)
If you will send this back to me so that I can follow-up. I called [facility] but since Friday afternoon they are closed. I was unable to request labs from 8/18. I will follow up next week.

## 2022-05-27 NOTE — Progress Notes (Unsigned)
Patient ID: Kerri Mills, female   DOB: 09-26-1931, 88 y.o.   MRN: [id number]   Subjective:    Patient ID: Kerri Mills, female    DOB: 1931/09/01, 88 y.o.   MRN: [id number]  This visit occurred during the SARS-CoV-2 public health emergency.  Safety protocols were in place, including screening questions prior to the visit, additional usage of staff PPE, and extensive cleaning of exam room while observing appropriate contact time as indicated for disinfecting solutions.   Patient here for No chief complaint on file.  Marland Kitchen   HPI    Past Medical History:  Diagnosis Date   Allergy    Anxiety    Arthritis    Atypical chest pain    a. 10/2018 MV: small, fixed apical defect possibly 2/2 attenuation artifact. No ischemia.  EF 68%.   Clotting disorder ([place])    Colitis    Depression    Diverticulitis 2013   Dysrhythmia    Gastric ulcer    GERD (gastroesophageal reflux disease)    History of echocardiogram    a. 09/2019 Echo: EF 55-60%, mod LVH. Mildly dil LA. Triv MR/TR.   Hypercholesterolemia    Hypertension    Hypothyroidism    Infiltrating lobular carcinoma of left breast 2011   T2,N0, ER: 90%; PR 0%; Her 2 neu not amplified. [facility]).   Melanoma ([place]) 1997   Melanoma in situ of upper extremity ([place]) 03/19/2011   Persistent atrial fibrillation ([place])    a. CHADS2VASc => 4 (HTN, age x 2, female)   Personal history of radiation therapy 2011   BREAST CA   Presence of permanent cardiac pacemaker    Seroma    HISTORY OF LFT BREAST   Sleep apnea    Thyroid cancer ([place]) 1992   Past Surgical History:  Procedure Laterality Date   ABDOMINAL HYSTERECTOMY  1973   partial   BREAST BIOPSY Left 02-13-13   BENIGN BREAST TISSUE WITH CHANGES CONSISTENT WITH FAT NECROSIS   BREAST BIOPSY Left 01/21/2015   bx done in [place] office 11:00 left 6-8cmfn   BREAST EXCISIONAL BIOPSY Left 1995   neg   BREAST EXCISIONAL BIOPSY Left 2011   Breast cancer radiation   BREAST LUMPECTOMY Left  2011   BREAST CA   CARDIAC CATHETERIZATION     CHOLECYSTECTOMY     COLONOSCOPY  2013   COLONOSCOPY WITH PROPOFOL N/A 06/09/2021   Procedure: COLONOSCOPY WITH PROPOFOL;  Surgeon: Jonathon Bellows, MD;  Location: [facility] ENDOSCOPY;  Service: Gastroenterology;  Laterality: N/A;   HAMMER TOE SURGERY Bilateral 02/24/2022   Procedure: HAMMER TOE CORRECTION 2, 3, 4 Right and 2nd Left;  Surgeon: Edrick Kins, DPM;  Location: [facility] ORS;  Service: Podiatry;  Laterality: Bilateral;   MELANOMA EXCISION     RT UPPER ARM   PACEMAKER IMPLANT N/A 01/10/2020   Procedure: PACEMAKER IMPLANT;  Surgeon: Constance Haw, MD;  Location: [facility];  Service: Cardiovascular;  Laterality: N/A;   PARTIAL HYSTERECTOMY     bleeding, ovaries in place.     THYROID SURGERY  1992   FOR THYROID CANCER   TONSILLECTOMY     Family History  Problem Relation Age of Onset   Heart disease Mother    Cancer Brother        lung    Cancer Sister        breast   Breast cancer Neg Hx    Social History   Socioeconomic History   Marital  status: Widowed    Spouse name: Not on file   Number of children: Not on file   Years of education: Not on file   Highest education level: Not on file  Occupational History   Not on file  Tobacco Use   Smoking status: Never   Smokeless tobacco: Never   Tobacco comments:    never  Vaping Use   Vaping Use: Never used  Substance and Sexual Activity   Alcohol use: Yes    Comment: once in a while   Drug use: No   Sexual activity: Never  Other Topics Concern   Not on file  Social History Narrative   Independent and baseline. Lives by herself   Social Determinants of Health   Financial Resource Strain: Low Risk  (02/08/2022)   Overall Financial Resource Strain (CARDIA)    Difficulty of Paying Living Expenses: Not hard at all  Food Insecurity: No Food Insecurity (02/08/2022)   Hunger Vital Sign    Worried About Running Out of Food in the Last Year: Never true    [place]  in the Last Year: Never true  Transportation Needs: No Transportation Needs (02/08/2022)   PRAPARE - Hydrologist (Medical): No    Lack of Transportation (Non-Medical): No  Physical Activity: Sufficiently Active (02/08/2022)   Exercise Vital Sign    Days of Exercise per Week: 4 days    Minutes of Exercise per Session: 40 min  Stress: No Stress Concern Present (02/08/2022)   [place]    Feeling of Stress : Only a little  Social Connections: Unknown (02/08/2022)   Social Connection and Isolation Panel [NHANES]    Frequency of Communication with Friends and Family: More than three times a week    Frequency of Social Gatherings with Friends and Family: More than three times a week    Attends Religious Services: Not on file    Active Member of Clubs or Organizations: Not on file    Attends Archivist Meetings: Not on file    Marital Status: Widowed     Review of Systems     Objective:     There were no vitals taken for this visit. Wt Readings from Last 3 Encounters:  03/23/22 169 lb 3.2 oz (76.7 kg)  02/24/22 167 lb (75.8 kg)  02/17/22 167 lb (75.8 kg)    Physical Exam   Outpatient Encounter Medications as of 05/27/2022  Medication Sig   acetaminophen (TYLENOL) 650 MG CR tablet Take 650 mg by mouth every 8 (eight) hours as needed for pain.   albuterol (VENTOLIN HFA) 108 (90 Base) MCG/ACT inhaler Inhale 2 puffs into the lungs every 6 (six) hours as needed for wheezing or shortness of breath.   azelastine (ASTELIN) 0.1 % nasal spray Place 1 spray into both nostrils 2 (two) times daily. Use in each nostril as directed (Patient taking differently: Place 1 spray into both nostrils as needed. Use in each nostril as directed)   cholecalciferol (VITAMIN D3) 25 MCG (1000 UNIT) tablet Take 1,000 Units by mouth daily.   diphenhydrAMINE (BENADRYL) 25 MG tablet Take 25-75 mg by mouth 3  (three) times daily as needed for allergies.   doxycycline (VIBRA-TABS) 100 MG tablet Take 1 tablet (100 mg total) by mouth 2 (two) times daily.   DULoxetine (CYMBALTA) 60 MG capsule TAKE ONE CAPSULE AT BEDTIME   ELIQUIS 5 MG TABS tablet TAKE 1  TABLET BY MOUTH TWICE A DAY   furosemide (LASIX) 20 MG tablet TAKE 1 TABLET BY MOUTH DAILY AS NEEDED. TAKE WITH POTASSIUM.   gabapentin (NEURONTIN) 300 MG capsule TAKE 2 CAPSULES BY MOUTH AT BEDTIME   levothyroxine (SYNTHROID) 100 MCG tablet TAKE 1 TABLET EVERY DAY ON EMPTY STOMACHWITH A GLASS OF WATER AT LEAST 30-60 MINBEFORE BREAKFAST   lovastatin (MEVACOR) 40 MG tablet TAKE 1 TABLET BY MOUTH DAILY   metoprolol tartrate (LOPRESSOR) 50 MG tablet TAKE 1.5 TABLETS BY MOUTH TWICE DAILY.   Multiple Vitamins-Minerals (PRESERVISION AREDS 2+MULTI VIT PO) Take 1 tablet by mouth 2 (two) times daily.   omeprazole (PRILOSEC) 20 MG capsule Take 1 capsule (20 mg total) by mouth daily.   oxyCODONE-acetaminophen (PERCOCET) 5-325 MG tablet Take 1 tablet by mouth every 4 (four) hours as needed for severe pain. (Patient not taking: Reported on 05/03/2022)   Polyethyl Glycol-Propyl Glycol (SYSTANE OP) Place 1 drop into both eyes daily as needed (for dry eyes).   polyethylene glycol powder (GLYCOLAX/MIRALAX) 17 GM/SCOOP powder Take 17 g by mouth daily.   potassium chloride (KLOR-CON) 10 MEQ tablet TAKE 2 TABLETS BY MOUTH DAILY AS NEEDED.WITH FUROSEMIDE.   Probiotic Product (ALIGN) 4 MG CAPS One capsule daily while on antibiotic and for two weeks after complete antibiotic (Patient taking differently: Take 1 capsule by mouth daily.)   triamcinolone cream (KENALOG) 0.1 % Apply 1 application. topically 2 (two) times daily.   vitamin B-12 (CYANOCOBALAMIN) 1000 MCG tablet Take 1,000 mcg by mouth daily.   White Petrolatum-Mineral Oil (GENTEAL TEARS NIGHT-TIME OP) Place 1 application into both eyes at bedtime. Night time ointment 3.5g   No facility-administered encounter medications  on file as of 05/27/2022.     Lab Results  Component Value Date   WBC 7.1 02/17/2022   HGB 13.9 02/17/2022   HCT 43.6 02/17/2022   PLT 209 02/17/2022   GLUCOSE 116 (H) 03/18/2022   CHOL 142 03/18/2022   TRIG 175.0 (H) 03/18/2022   HDL 38.00 (L) 03/18/2022   LDLDIRECT 90.0 03/15/2021   LDLCALC 69 03/18/2022   ALT 13 03/18/2022   AST 21 03/18/2022   NA 140 03/18/2022   K 4.1 03/18/2022   CL 103 03/18/2022   CREATININE 0.81 03/18/2022   BUN 12 03/18/2022   CO2 30 03/18/2022   TSH 1.39 05/12/2021   INR 1.5 (H) 11/21/2018   HGBA1C 6.4 03/18/2022    CT CHEST WO CONTRAST  Result Date: 05/05/2022 CLINICAL DATA:  Lung nodule, shortness of breath on exertion, productive cough EXAM: CT CHEST WITHOUT CONTRAST TECHNIQUE: Multidetector CT imaging of the chest was performed following the standard protocol without IV contrast. RADIATION DOSE REDUCTION: This exam was performed according to the departmental dose-optimization program which includes automated exposure control, adjustment of the mA and/or kV according to patient size and/or use of iterative reconstruction technique. COMPARISON:  CT done on 05/04/2021, chest radiograph done on 11/29/2021 FINDINGS: Cardiovascular: Coronary artery calcifications are seen. There is ectasia of ascending thoracic aorta measuring 4 cm. There is ectasia of main pulmonary artery measuring 3.9 cm suggesting pulmonary arterial hypertension. Pacer leads are noted in place. Pacemaker battery is seen in the left infraclavicular region. Mediastinum/Nodes: No new significant lymphadenopathy is seen in mediastinum. Lungs/Pleura: Bronchiectasis is seen. There are linear patchy densities in the anterior segment of the right upper lobe and right middle lobe with no significant change. Pleural thickening seen in the left anterior lower lung field may be due to scarring from previous  radiation treatment. There is interval decrease in number of nodules in right upper lobe and right  lower lobe. Largest of the residual nodules in the right lung he is in right lower lobe measuring 5 mm. Small foci of tree in bud appearance in the lateral aspect of right middle lobe and right lower lobe appear less prominent. This may suggest residual scarring. There are no new discrete nodules. There is no new focal pulmonary consolidation. Upper Abdomen: Surgical clips are seen in gallbladder fossa. Small hiatal hernia is seen. Musculoskeletal: There is previous left breast surgery with residual lobulated soft tissue density measuring approximally 8.4 x 2.2 cm which measured 6.6 x 2.6 cm in the previous study. There are multiple coarse calcifications in the area of surgery in the left anterior chest wall Coarse trabeculae seen in few of the thoracic vertebral bodies may suggest hemangiomas. As far as seen, there are no new discrete lytic or sclerotic lesions in the bony structures. IMPRESSION: There is interval decrease in size and number of nodules in the lung fields. Largest of the nodules in the current study measures 5 mm in right lower lobe. Findings may suggest resolving inflammatory/infectious process. There are no new nodules or new focal infiltrates. Bronchiectasis. There are small scattered foci of scarring. No new significant lymphadenopathy is seen. There is no pleural effusion. Postsurgical changes with scarring and coarse calcifications are seen in left anterior chest wall with interval increase in size. This may suggest progression of fibrotic change or local recurrence of neoplastic process. Coronary artery calcifications are seen. There is ectasia of ascending thoracic aorta measuring 4 cm. There is ectasia of the main pulmonary artery suggesting pulmonary arterial hypertension. Electronically Signed   By: Elmer Picker M.D.   On: 05/05/2022 20:27       Assessment & Plan:   Problem List Items Addressed This Visit   None    Einar Pheasant, MD

## 2022-05-28 ENCOUNTER — Encounter: Payer: Self-pay | Admitting: Internal Medicine

## 2022-05-28 DIAGNOSIS — R1310 Dysphagia, unspecified: Secondary | ICD-10-CM | POA: Insufficient documentation

## 2022-05-28 NOTE — Assessment & Plan Note (Signed)
On metoprolol and lasix prn.   Blood pressure as outlined.  Continue current medication regimen. Hold on making changes.   Follow pressures.  Follow metabolic panel.  

## 2022-05-28 NOTE — Assessment & Plan Note (Signed)
Elevated on recent check.  Feel related to recent surgery.  Follow.

## 2022-05-28 NOTE — Assessment & Plan Note (Signed)
Recent evaluation. Planning for barium swallow. Neuro - recommended referral to ST.

## 2022-05-28 NOTE — Assessment & Plan Note (Signed)
On lovastatin 

## 2022-05-28 NOTE — Assessment & Plan Note (Signed)
On thyroid replacement.  Follow tsh.  

## 2022-05-28 NOTE — Telephone Encounter (Signed)
I reviewed the labs in system.  The only low lab was her vitamin D.  If she is not taking any vitamin  D, have her start vitamin D3 1066mg q day.  (Make sure she knows is vitamin D3 (and not B3 - she was saying B3).

## 2022-05-28 NOTE — Assessment & Plan Note (Signed)
Continue cpap. Discussed using regularly.   

## 2022-05-28 NOTE — Assessment & Plan Note (Signed)
Continue on cymbalta.  Follow  

## 2022-05-28 NOTE — Assessment & Plan Note (Signed)
Low carb diet and exercise.  Follow met b and a1c.   

## 2022-05-28 NOTE — Assessment & Plan Note (Signed)
Seeing Dr Rockey Situ and EP.  On metoprolol '75mg'$  bid.  Follow.

## 2022-05-28 NOTE — Assessment & Plan Note (Signed)
01/10/22 - Birads I.  Evaluated by Dr Byrnett 01/20/22 - recommended yearly f/u - Dr Cintron-Diaz.  

## 2022-05-28 NOTE — Assessment & Plan Note (Signed)
On lovastatin.  Continue diet and exercise.  Follow fasting profile and liver panel.  

## 2022-05-28 NOTE — Assessment & Plan Note (Signed)
Felt to be benign essential tremor.  Seeing neurology.

## 2022-05-28 NOTE — Assessment & Plan Note (Signed)
Found to have vitamin D deficiency.  Start vitamin D3.

## 2022-05-28 NOTE — Assessment & Plan Note (Signed)
S/p pacemaker placement.  On eliquis and metoprolol.  Overall appears to be stable.

## 2022-05-28 NOTE — Assessment & Plan Note (Signed)
Upper symptoms appear to be controlled on omeprazole.  

## 2022-05-31 ENCOUNTER — Encounter: Payer: Self-pay | Admitting: Internal Medicine

## 2022-05-31 ENCOUNTER — Ambulatory Visit: Payer: PPO | Admitting: Internal Medicine

## 2022-05-31 VITALS — BP 118/72 | HR 63 | Temp 98.0°F | Ht 65.0 in | Wt 170.6 lb

## 2022-05-31 DIAGNOSIS — J479 Bronchiectasis, uncomplicated: Secondary | ICD-10-CM | POA: Diagnosis not present

## 2022-05-31 DIAGNOSIS — G4733 Obstructive sleep apnea (adult) (pediatric): Secondary | ICD-10-CM

## 2022-05-31 NOTE — Progress Notes (Signed)
Tests May 04, 2021 that showed mild restriction with no significant airflow obstruction.  FEV1 78%, ratio 78, FVC 74%.  No significant bronchodilator response DLCO 76%. CT chest completed on May 04, 2021 which showed resolved right lower lobe nodule.  Areas of new nodularity measuring up to 8 mm in the periphery of the right lower lobe.  Mild bronchiectasis.  Micro and macro nodularity.  Incidental finding of a soft tissue mass in the left breast.    Chief Complaint  Patient presents with   Follow-up    CT 05/05/22--SOB with exertion and occ prod cough with grey to green sputum. Wears cpap avg 5hr nightly.       HPI: 88 year old female never smoker followed for chronic bronchitis and bronchiectasis Medical history significant for Breast cancer, thyroid cancer and melanoma .    05/31/2022  Follow up: Chronic Bronchitis and Bronchiectasis  Doing well over all chronic bronchitis and bronchiectasis.     Previous COVID-19 infection July 2022  Regarding her OSA on CPAP Excellent compliance report 04/2022 87% and 80% AutoCPAP 5-12  No exacerbation at this time No evidence of heart failure at this time No evidence or signs of infection at this time No respiratory distress No fevers, chills, nausea, vomiting, diarrhea No evidence of lower extremity edema No evidence hemoptysis    Tobacco History: Social History   Tobacco Use  Smoking Status Never  Smokeless Tobacco Never  Tobacco Comments   never   Counseling given: Not Answered Tobacco comments: never    Outpatient Medications Prior to Visit  Medication Sig Dispense Refill   acetaminophen (TYLENOL) 650 MG CR tablet Take 650 mg by mouth every 8 (eight) hours as needed for pain.     albuterol (VENTOLIN HFA) 108 (90 Base) MCG/ACT inhaler Inhale 2 puffs into the lungs every 6 (six) hours as needed for wheezing or shortness of breath.     azelastine (ASTELIN) 0.1 % nasal spray Place 1 spray into both nostrils 2  (two) times daily. Use in each nostril as directed (Patient taking differently: Place 1 spray into both nostrils as needed. Use in each nostril as directed) 30 mL 0   cholecalciferol (VITAMIN D3) 25 MCG (1000 UNIT) tablet Take 1,000 Units by mouth daily.     DULoxetine (CYMBALTA) 60 MG capsule TAKE ONE CAPSULE AT BEDTIME 90 capsule 1   ELIQUIS 5 MG TABS tablet TAKE 1 TABLET BY MOUTH TWICE A DAY 180 tablet 1   furosemide (LASIX) 20 MG tablet TAKE 1 TABLET BY MOUTH DAILY AS NEEDED. TAKE WITH POTASSIUM. 90 tablet 3   gabapentin (NEURONTIN) 300 MG capsule TAKE 2 CAPSULES BY MOUTH AT BEDTIME 180 capsule 1   levothyroxine (SYNTHROID) 100 MCG tablet TAKE 1 TABLET EVERY DAY ON EMPTY STOMACHWITH A GLASS OF WATER AT LEAST 30-60 MINBEFORE BREAKFAST 90 tablet 1   lovastatin (MEVACOR) 40 MG tablet TAKE 1 TABLET BY MOUTH DAILY 90 tablet 3   metoprolol tartrate (LOPRESSOR) 50 MG tablet TAKE 1.5 TABLETS BY MOUTH TWICE DAILY. 270 tablet 0   Multiple Vitamins-Minerals (PRESERVISION AREDS 2+MULTI VIT PO) Take 1 tablet by mouth 2 (two) times daily.     omeprazole (PRILOSEC) 20 MG capsule Take 1 capsule (20 mg total) by mouth daily. 90 capsule 3   Polyethyl Glycol-Propyl Glycol (SYSTANE OP) Place 1 drop into both eyes daily as needed (for dry eyes).     polyethylene glycol powder (GLYCOLAX/MIRALAX) 17 GM/SCOOP powder Take 17 g by mouth daily. 850 g  1   potassium chloride (KLOR-CON) 10 MEQ tablet TAKE 2 TABLETS BY MOUTH DAILY AS NEEDED.WITH FUROSEMIDE. 60 tablet 1   Probiotic Product (ALIGN) 4 MG CAPS One capsule daily while on antibiotic and for two weeks after complete antibiotic (Patient taking differently: Take 1 capsule by mouth daily.) 30 capsule 0   triamcinolone cream (KENALOG) 0.1 % Apply 1 application. topically 2 (two) times daily. 30 g 0   vitamin B-12 (CYANOCOBALAMIN) 1000 MCG tablet Take 1,000 mcg by mouth daily.     White Petrolatum-Mineral Oil (GENTEAL TEARS NIGHT-TIME OP) Place 1 application into both  eyes at bedtime. Night time ointment 3.5g     No facility-administered medications prior to visit.        Review of Systems: Gen:  Denies  fever, sweats, chills weight loss  HEENT: Denies blurred vision, double vision, ear pain, eye pain, hearing loss, nose bleeds, sore throat Cardiac:  No dizziness, chest pain or heaviness, chest tightness,edema, No JVD Resp:   No cough, -sputum production, -shortness of breath,-wheezing, -hemoptysis,  Other:  All other systems negative  BP 118/72 (BP Location: Left Arm, Cuff Size: Normal)   Pulse 63   Temp 98 F (36.7 C) (Temporal)   Ht '5\' 5"'$  (1.651 m)   Wt 170 lb 9.6 oz (77.4 kg)   SpO2 94%   BMI 28.39 kg/m    Physical Examination:   General Appearance: No distress  EYES PERRLA, EOM intact.   NECK Supple, No JVD Pulmonary: normal breath sounds, No wheezing.  CardiovascularNormal S1,S2.  No m/r/g.   Abdomen: Benign, Soft, non-tender. ALL OTHER ROS ARE NEGATIVE     ASSESSMENT AND PLAN  Bronchiectasis without complication (HCC) No signs of infection at this time No indication for antibiotics or steroids Continue flutter valve as tolerated CT of the chest Aug 2023 reviewed in detail No significant changes from previous CAT scan Mucinex Twice daily  As needed  cough/congestion  Albuterol inhaler 1-2 puffs every 6hr as needed for wheezing /shortness of breath    Regarding OSA Continue CPAP on a daily basis Excellent compliance report Recommend wearing her CPAP during nap time Recommend checking for out nocturnal hypoxia on CPAP    Follow up with Primary Provider regarding abnormal breast finding .     Abnormal chest CT Right lung nodularity consider MAI or atypical pneumonia No significant symptoms at this time No significant changes from August 2023 CT scan   MEDICATION ADJUSTMENTS/LABS AND TESTS ORDERED: Continue CPAP as prescribed Recommend using every day Albuterol as needed    CURRENT MEDICATIONS REVIEWED  AT LENGTH WITH PATIENT TODAY   Patient  satisfied with Plan of action and management. All questions answered  Follow up 1 year  Total time spent 21 minutes   Maretta Bees Patricia Pesa, M.D.  [facility]  Medical Director [place] Director [facility] Cardio-Pulmonary Department

## 2022-05-31 NOTE — Patient Instructions (Signed)
Continue CPAP as prescribed GREAT JOB A!!!  Check oxygen levels on CPAP Avoid second hand smoke Use albuterol as needed Please use flutter valve 10-15 times per day

## 2022-06-01 NOTE — Telephone Encounter (Signed)
Pt called and she stated that she would start the 2000 IU daily instead.

## 2022-06-01 NOTE — Telephone Encounter (Signed)
Noted.  If she is not on any prescription vitamin D and is taking 1000 units vitamin D3 per day, then have her increase to 2000 units vitamin D3 q day.

## 2022-06-01 NOTE — Telephone Encounter (Signed)
I called patient & she went to get her bottle. She has been taking Vitamin D3 1000 IU for she doesn't know how long bur a long time she says.

## 2022-06-01 NOTE — Telephone Encounter (Signed)
LMTCB

## 2022-06-03 ENCOUNTER — Encounter: Payer: Self-pay | Admitting: Cardiology

## 2022-06-03 ENCOUNTER — Ambulatory Visit: Payer: PPO | Attending: Cardiology | Admitting: Cardiology

## 2022-06-03 VITALS — BP 128/70 | HR 74 | Ht 65.0 in | Wt 173.4 lb

## 2022-06-03 DIAGNOSIS — I495 Sick sinus syndrome: Secondary | ICD-10-CM

## 2022-06-03 DIAGNOSIS — I493 Ventricular premature depolarization: Secondary | ICD-10-CM | POA: Diagnosis not present

## 2022-06-03 LAB — CUP PACEART INCLINIC DEVICE CHECK
Date Time Interrogation Session: 20230908161547
Implantable Lead Implant Date: 20210416
Implantable Lead Implant Date: 20210416
Implantable Lead Location: 753859
Implantable Lead Location: 753860
Implantable Pulse Generator Implant Date: 20210416
Pulse Gen Model: 2272
Pulse Gen Serial Number: 3813093

## 2022-06-03 NOTE — Progress Notes (Signed)
Electrophysiology Office Note   Date:  06/03/2022   ID:  [name], [name] 1931-01-07, MRN [id number]  PCP:  Kerri Pheasant, MD  Cardiologist:  Rockey Situ Primary Electrophysiologist:  Yoltzin Barg Meredith Leeds, MD    Chief Complaint: pacemaker   History of Present Illness: Kerri Mills is a 88 y.o. female who is being seen today for the evaluation of pacemaker at the request of Kerri Pheasant, MD. Presenting today for electrophysiology evaluation.  She has a history significant for atrial fibrillation, hypertension, PVCs, tachybradycardia syndrome.  She presented to the hospital April 2021 with tachybradycardia syndrome.  She is now status post [place] dual-chamber pacemaker implanted 01/10/2020.  Today, denies symptoms of palpitations, chest pain, shortness of breath, orthopnea, PND, lower extremity edema, claudication, dizziness, presyncope, syncope, bleeding, or neurologic sequela. The patient is tolerating medications without difficulties.  She has been doing well other than some increased fatigue and weakness.  She is having PVCs on her ECG as well as noted on her pacemaker.  She states that she is sleeping more often than usual and has more fatigue than she usually has.  She is unaware of any triggering factors.  She continues to exercise, going to [place] and doing Silver sneakers.   Past Medical History:  Diagnosis Date   Allergy    Anxiety    Arthritis    Atypical chest pain    a. 10/2018 MV: small, fixed apical defect possibly 2/2 attenuation artifact. No ischemia.  EF 68%.   Clotting disorder ([place])    Colitis    Depression    Diverticulitis 2013   Dysrhythmia    Gastric ulcer    GERD (gastroesophageal reflux disease)    History of echocardiogram    a. 09/2019 Echo: EF 55-60%, mod LVH. Mildly dil LA. Triv MR/TR.   Hypercholesterolemia    Hypertension    Hypothyroidism    Infiltrating lobular carcinoma of left breast 2011   T2,N0, ER: 90%; PR 0%; Her 2 neu not amplified.  [facility]).   Melanoma ([place]) 1997   Melanoma in situ of upper extremity ([place]) 03/19/2011   Persistent atrial fibrillation ([place])    a. CHADS2VASc => 4 (HTN, age x 2, female)   Personal history of radiation therapy 2011   BREAST CA   Presence of permanent cardiac pacemaker    Seroma    HISTORY OF LFT BREAST   Sleep apnea    Thyroid cancer ([place]) 1992   Past Surgical History:  Procedure Laterality Date   ABDOMINAL HYSTERECTOMY  1973   partial   BREAST BIOPSY Left 02-13-13   BENIGN BREAST TISSUE WITH CHANGES CONSISTENT WITH FAT NECROSIS   BREAST BIOPSY Left 01/21/2015   bx done in [facility] office 11:00 left 6-8cmfn   BREAST EXCISIONAL BIOPSY Left 1995   neg   BREAST EXCISIONAL BIOPSY Left 2011   Breast cancer radiation   BREAST LUMPECTOMY Left 2011   BREAST CA   CARDIAC CATHETERIZATION     CHOLECYSTECTOMY     COLONOSCOPY  2013   COLONOSCOPY WITH PROPOFOL N/A 06/09/2021   Procedure: COLONOSCOPY WITH PROPOFOL;  Surgeon: Jonathon Bellows, MD;  Location: [facility] ENDOSCOPY;  Service: Gastroenterology;  Laterality: N/A;   HAMMER TOE SURGERY Bilateral 02/24/2022   Procedure: HAMMER TOE CORRECTION 2, 3, 4 Right and 2nd Left;  Surgeon: Edrick Kins, DPM;  Location: [facility] ORS;  Service: Podiatry;  Laterality: Bilateral;   MELANOMA EXCISION     RT UPPER ARM   PACEMAKER IMPLANT  N/A 01/10/2020   Procedure: PACEMAKER IMPLANT;  Surgeon: Constance Haw, MD;  Location: [facility];  Service: Cardiovascular;  Laterality: N/A;   PARTIAL HYSTERECTOMY     bleeding, ovaries in place.     THYROID SURGERY  1992   FOR THYROID CANCER   TONSILLECTOMY       Current Outpatient Medications  Medication Sig Dispense Refill   acetaminophen (TYLENOL) 650 MG CR tablet Take 650 mg by mouth every 8 (eight) hours as needed for pain.     albuterol (VENTOLIN HFA) 108 (90 Base) MCG/ACT inhaler Inhale 2 puffs into the lungs every 6 (six) hours as needed for wheezing or shortness of breath.     azelastine  (ASTELIN) 0.1 % nasal spray Place 1 spray into both nostrils 2 (two) times daily. Use in each nostril as directed 30 mL 0   cholecalciferol (VITAMIN D3) 25 MCG (1000 UNIT) tablet Take 2,000 Units by mouth daily.     DULoxetine (CYMBALTA) 60 MG capsule TAKE ONE CAPSULE AT BEDTIME 90 capsule 1   ELIQUIS 5 MG TABS tablet TAKE 1 TABLET BY MOUTH TWICE A DAY 180 tablet 1   furosemide (LASIX) 20 MG tablet TAKE 1 TABLET BY MOUTH DAILY AS NEEDED. TAKE WITH POTASSIUM. 90 tablet 3   gabapentin (NEURONTIN) 300 MG capsule TAKE 2 CAPSULES BY MOUTH AT BEDTIME 180 capsule 1   levothyroxine (SYNTHROID) 100 MCG tablet TAKE 1 TABLET EVERY DAY ON EMPTY STOMACHWITH A GLASS OF WATER AT LEAST 30-60 MINBEFORE BREAKFAST 90 tablet 1   lovastatin (MEVACOR) 40 MG tablet TAKE 1 TABLET BY MOUTH DAILY 90 tablet 3   metoprolol tartrate (LOPRESSOR) 50 MG tablet TAKE 1.5 TABLETS BY MOUTH TWICE DAILY. (Patient taking differently: Take 75 mg by mouth 2 (two) times daily.) 270 tablet 0   Multiple Vitamins-Minerals (PRESERVISION AREDS 2+MULTI VIT PO) Take 1 tablet by mouth 2 (two) times daily.     omeprazole (PRILOSEC) 20 MG capsule Take 1 capsule (20 mg total) by mouth daily. 90 capsule 3   Polyethyl Glycol-Propyl Glycol (SYSTANE OP) Place 1 drop into both eyes daily as needed (for dry eyes).     polyethylene glycol powder (GLYCOLAX/MIRALAX) 17 GM/SCOOP powder Take 17 g by mouth daily. 850 g 1   potassium chloride (KLOR-CON) 10 MEQ tablet TAKE 2 TABLETS BY MOUTH DAILY AS NEEDED.WITH FUROSEMIDE. 60 tablet 1   Probiotic Product (ALIGN) 4 MG CAPS One capsule daily while on antibiotic and for two weeks after complete antibiotic 30 capsule 0   triamcinolone cream (KENALOG) 0.1 % Apply 1 application. topically 2 (two) times daily. 30 g 0   vitamin B-12 (CYANOCOBALAMIN) 1000 MCG tablet Take 1,000 mcg by mouth daily.     White Petrolatum-Mineral Oil (GENTEAL TEARS NIGHT-TIME OP) Place 1 application into both eyes at bedtime. Night time  ointment 3.5g     No current facility-administered medications for this visit.    Allergies:   Lipitor [atorvastatin calcium] and Penicillins   Social History:  The patient  reports that she has never smoked. She has never used smokeless tobacco. She reports current alcohol use. She reports that she does not use drugs.   Family History:  The patient's family history includes Cancer in her brother and sister; Heart disease in her mother.   ROS:  Please see the history of present illness.   Otherwise, review of systems is positive for none.   All other systems are reviewed and negative.   PHYSICAL EXAM: VS:  BP 128/70   Pulse 74   Ht '5\' 5"'$  (1.651 m)   Wt 173 lb 6.4 oz (78.7 kg)   SpO2 92%   BMI 28.86 kg/m  , BMI Body mass index is 28.86 kg/m. GEN: Well nourished, well developed, in no acute distress  HEENT: normal  Neck: no JVD, carotid bruits, or masses Cardiac: Irregular; no murmurs, rubs, or gallops,no edema  Respiratory:  clear to auscultation bilaterally, normal work of breathing GI: soft, nontender, nondistended, + BS MS: no deformity or atrophy  Skin: warm and dry, device site well healed Neuro:  Strength and sensation are intact Psych: euthymic mood, full affect  EKG:  EKG is ordered today. Personal review of the ekg ordered shows atrial paced, PVCs, rate 74  Personal review of the device interrogation today. Results in [place]: 02/17/2022: Hemoglobin 13.9; Platelets 209 03/18/2022: ALT 13; BUN 12; Creatinine, Ser 0.81; Potassium 4.1; Sodium 140    Lipid Panel     Component Value Date/Time   CHOL 142 03/18/2022 0911   TRIG 175.0 (H) 03/18/2022 0911   HDL 38.00 (L) 03/18/2022 0911   CHOLHDL 4 03/18/2022 0911   VLDL 35.0 03/18/2022 0911   LDLCALC 69 03/18/2022 0911   LDLDIRECT 90.0 03/15/2021 1059     Wt Readings from Last 3 Encounters:  06/03/22 173 lb 6.4 oz (78.7 kg)  05/31/22 170 lb 9.6 oz (77.4 kg)  05/27/22 171 lb (77.6 kg)       Other studies Reviewed: Additional studies/ records that were reviewed today include: TTE 10/21/19  Review of the above records today demonstrates:   1. Left ventricular ejection fraction, by visual estimation, is 55 to  60%. The left ventricle has normal function. There is moderately increased  left ventricular hypertrophy.   2. Left ventricular diastolic parameters are indeterminate.   3. The left ventricle has no regional wall motion abnormalities.   4. Global right ventricle has normal systolic function.The right  ventricular size is normal. No increase in right ventricular wall  thickness.   5. Left atrial size was mildly dilated.   6. Right atrial size was normal.   7. The mitral valve is normal in structure. Trivial mitral valve  regurgitation. No evidence of mitral stenosis.   8. The tricuspid valve is normal in structure. Tricuspid valve  regurgitation is trivial.   9. The aortic valve is normal in structure. Aortic valve regurgitation is  not visualized. Mild to moderate aortic valve sclerosis/calcification  without any evidence of aortic stenosis.  10. The pulmonic valve was normal in structure. Pulmonic valve  regurgitation is not visualized.  11. TR signal is inadequate for assessing pulmonary artery systolic  pressure.  12. The inferior vena cava is normal in size with greater than 50%  respiratory variability, suggesting right atrial pressure of 3 mmHg.    ASSESSMENT AND PLAN:  1.  Tachybradycardia syndrome: Status post [place] [employer] dual-chamber pacemaker implanted 01/10/2020.  Device functioning appropriately.  No changes at this time.  2.  Paroxysmal atrial fibrillation: Currently on Eliquis.  CHA2DS2-VASc of 4.  Episodes.  No changes.  3.  PVCs: She is weak and fatigued.  She has PVCs on her ECG as well as PVCs on her pacemaker.  We Namine Beahm plan for a 1 week monitor to get an accurate burden.  She Simmie Camerer likely need some antiarrhythmic medication for suppression as  she is symptomatic.   Current medicines are reviewed at length with the patient today.  The patient does not have concerns regarding her medicines.  The following changes were made today: None  Labs/ tests ordered today include:  Orders Placed This Encounter  Procedures   EKG 12-Lead      Disposition:   FU with Jahmere Bramel pending monitor months  Signed, Letisia Schwalb Meredith Leeds, MD  06/03/2022 3:08 PM     [facility] [address] [place] [address] [phone] (office) [phone] (fax)

## 2022-06-03 NOTE — Patient Instructions (Addendum)
Medication Instructions:  Your physician recommends that you continue on your current medications as directed. Please refer to the Current Medication list given to you today.  *If you need a refill on your cardiac medications before your next appointment, please call your pharmacy*   Lab Work: None ordered If you have labs (blood work) drawn today and your tests are completely normal, you will receive your results only by: [place] (if you have MyChart) OR A paper copy in the mail If you have any lab test that is abnormal or we need to change your treatment, we will call you to review the results.   Testing/Procedures:                           Bryn Gulling- Long Term Monitor Instructions  Your physician has requested you wear a ZIO patch monitor for 7 days.  This is a single patch monitor. [employer] supplies one patch monitor per enrollment. Additional stickers are not available. Please do not apply patch if you will be having a Nuclear Stress Test,  Echocardiogram, Cardiac CT, MRI, or Chest Xray during the period you would be wearing the  monitor. The patch cannot be worn during these tests. You cannot remove and re-apply the  ZIO XT patch monitor.  Your ZIO patch monitor will be mailed 3 day USPS to your address on file. It may take 3-5 days  to receive your monitor after you have been enrolled.  Once you have received your monitor, please review the enclosed instructions. Your monitor  has already been registered assigning a specific monitor serial # to you.  Billing and Patient Assistance Program Information  We have supplied [employer] with any of your insurance information on file for billing purposes. [employer] offers a sliding scale Patient Assistance Program for patients that do not have  insurance, or whose insurance does not completely cover the cost of the ZIO monitor.  You must apply for the Patient Assistance Program to qualify for this discounted rate.  To apply, please  call [employer] at [phone], select option 4, select option 2, ask to apply for  Patient Assistance Program. Theodore Demark will ask your household income, and how many people  are in your household. They will quote your out-of-pocket cost based on that information.  [employer] will also be able to set up a 19-month interest-free payment plan if needed.  Applying the monitor   Shave hair from upper left chest.  Hold abrader disc by orange tab. Rub abrader in 40 strokes over the upper left chest as  indicated in your monitor instructions.  Clean area with 4 enclosed alcohol pads. Let dry.  Apply patch as indicated in monitor instructions. Patch will be placed under collarbone on left  side of chest with arrow pointing upward.  Rub patch adhesive wings for 2 minutes. Remove white label marked "1". Remove the white  label marked "2". Rub patch adhesive wings for 2 additional minutes.  While looking in a mirror, press and release button in center of patch. A small green light will  flash 3-4 times. This will be your only indicator that the monitor has been turned on.  Do not shower for the first 24 hours. You may shower after the first 24 hours.  Press the button if you feel a symptom. You will hear a small click. Record Date, Time and  Symptom in the Patient Logbook.  When you are ready to remove the  patch, follow instructions on the last 2 pages of Patient  Logbook. Stick patch monitor onto the last page of Patient Logbook.  Place Patient Logbook in the blue and white box. Use locking tab on box and tape box closed  securely. The blue and white box has prepaid postage on it. Please place it in the mailbox as  soon as possible. Your physician should have your test results approximately 7 days after the  monitor has been mailed back to [facility].  Call [place] at [phone] if you have questions regarding  your ZIO XT patch monitor. Call them immediately if you see an  orange light blinking on your  monitor.  If your monitor falls off in less than 4 days, contact our Monitor department at [phone].  If your monitor becomes loose or falls off after 4 days call [employer] at [phone] for  suggestions on securing your monitor    Follow-Up: At [facility], you and your health needs are our priority.  As part of our continuing mission to provide you with exceptional heart care, we have created designated Provider Care Teams.  These Care Teams include your primary Cardiologist (physician) and Advanced Practice Providers (APPs -  Physician Assistants and Nurse Practitioners) who all work together to provide you with the care you need, when you need it.  Remote monitoring is used to monitor your Pacemaker or ICD from home. This monitoring reduces the number of office visits required to check your device to one time per year. It allows [place] to keep an eye on the functioning of your device to ensure it is working properly. You are scheduled for a device check from home on   . You may send your transmission at any time that day. If you have a wireless device, the transmission will be sent automatically. After your physician reviews your transmission, you will receive a postcard with your next transmission date.  Your next appointment:   1 year(s)  The format for your next appointment:   In Person  Provider:   Allegra Lai, MD    Thank you for choosing [facility]!!   Trinidad Curet, RN [phone]    Other Instructions  Important Information About [place]

## 2022-06-06 ENCOUNTER — Ambulatory Visit: Payer: PPO | Attending: Cardiology

## 2022-06-06 DIAGNOSIS — I493 Ventricular premature depolarization: Secondary | ICD-10-CM

## 2022-06-06 NOTE — Progress Notes (Unsigned)
Enrolled for [employer] to mail a ZIO XT long term holter monitor to the patients address on file.  

## 2022-06-07 DIAGNOSIS — I493 Ventricular premature depolarization: Secondary | ICD-10-CM | POA: Diagnosis not present

## 2022-06-08 ENCOUNTER — Other Ambulatory Visit: Payer: Self-pay | Admitting: Cardiovascular Disease

## 2022-06-08 NOTE — Telephone Encounter (Signed)
Prescription refill request for Eliquis received. Indication:Afib Last office visit:9/23 Scr:0.8 Age: 88 Weight:78.7 kg  Prescription refilled

## 2022-06-08 NOTE — Telephone Encounter (Signed)
Refill request

## 2022-06-14 ENCOUNTER — Other Ambulatory Visit: Payer: Self-pay | Admitting: Internal Medicine

## 2022-06-20 DIAGNOSIS — I493 Ventricular premature depolarization: Secondary | ICD-10-CM | POA: Diagnosis not present

## 2022-07-04 ENCOUNTER — Telehealth: Payer: Self-pay | Admitting: *Deleted

## 2022-07-04 MED ORDER — AMIODARONE HCL 200 MG PO TABS: 200.0000 mg | ORAL_TABLET | Freq: Every day | ORAL | 3 refills | Status: DC

## 2022-07-04 NOTE — Telephone Encounter (Signed)
Pt is going to hold Lovastatin for now, until I hear back from MD. Forwarding to pharmD for input...Marland KitchenMarland Kitchen

## 2022-07-04 NOTE — Telephone Encounter (Signed)
   Sherie from [facility] called, they would like to clarify prescription they received today for amiodarone. She said, their is a drug interaction with pt's lovastatin and metoprolol. She wanted to check in if its ok to fill amiodarone or needs to adjust some of pt's medications

## 2022-07-04 NOTE — Telephone Encounter (Signed)
-----   Message from Will Meredith Leeds, MD sent at 06/20/2022  4:33 PM EDT ----- Elevated PVC burden.  Start amiodarone 200 mg daily.

## 2022-07-04 NOTE — Telephone Encounter (Signed)
Informed patient of results and verbal understanding expressed. [facility] per pt request. Advised to call if she experiences SE after starting. Will send her information on the medication via mychart. Patient verbalized understanding and agreeable to plan.

## 2022-07-05 ENCOUNTER — Telehealth: Payer: Self-pay | Admitting: Cardiovascular Disease

## 2022-07-05 NOTE — Telephone Encounter (Signed)
To Dr. Gollan to review.  

## 2022-07-05 NOTE — Telephone Encounter (Signed)
Pt c/o medication issue:  1. Name of Medication: amiodarone (PACERONE) 200 MG tablet  2. How are you currently taking this medication (dosage and times per day)? Take 1 tablet (200 mg total) by mouth daily.  3. Are you having a reaction (difficulty breathing--STAT)? No   4. What is your medication issue? Patient was recently put on this medication by Dr. Curt Bears, she would like Dr. Rockey Situ about his medication.  She is reading up on the medication and is getting worried about taking this medication.

## 2022-07-05 NOTE — Telephone Encounter (Signed)
Could change lovastatin to pravastatin '40mg'$ . No issue with metoprolol. Both amio and metoprolol can lower HR, patient has pacemaker and HR will be monitored. Can adjust as needed.

## 2022-07-06 NOTE — Telephone Encounter (Signed)
Minna Merritts, MD  Sent: Wed July 06, 2022  7:52 AM  To: Emily Filbert, RN          Message  Her recent monitor showed 22% ventricular ectopy which is a high burden  This can affect cardiac function, and give her symptoms  Amiodarone is one of the most effective medications to help suppress ventricular ectopy  Commonly used, very effective, typically well-tolerated,  We will be periodically screening to make sure there is no undue effect on her  We screen thyroid, liver  Dose is also very low which should minimize side effects  I Feel comfortable with her on it  Thx  TGollan

## 2022-07-06 NOTE — Telephone Encounter (Signed)
I spoke with the patient and advised her of Dr. Donivan Scull recommendations as stated below regarding her amiodarone.   The patient was concerned with the side effects due to her history of thyroid cancer and that she is currently on a thyroid medication.  I advised the patient that we will closely monitor her TSH/ Liver functions and this should be checked about 4 weeks after starting the medication.   We dicussed the results of her heart monitor further and the reason for treating her PVC's with amiodarone.   The patient voices understanding and is agreeable.  She is due to follow up with Dr. Rockey Situ in November. I have scheduled her to see Dr. Rockey Situ on 11/14 at 11:00 am. She is aware that I will put on her appointment notes to please draw a repeat TSH/ Liver at her next appointment.  The patient again voices understanding and is agreeable.

## 2022-07-14 ENCOUNTER — Ambulatory Visit (INDEPENDENT_AMBULATORY_CARE_PROVIDER_SITE_OTHER): Payer: PPO

## 2022-07-14 DIAGNOSIS — Z23 Encounter for immunization: Secondary | ICD-10-CM | POA: Diagnosis not present

## 2022-07-19 ENCOUNTER — Ambulatory Visit (INDEPENDENT_AMBULATORY_CARE_PROVIDER_SITE_OTHER): Payer: PPO

## 2022-07-19 DIAGNOSIS — I495 Sick sinus syndrome: Secondary | ICD-10-CM | POA: Diagnosis not present

## 2022-07-21 LAB — CUP PACEART REMOTE DEVICE CHECK
Battery Remaining Longevity: 67 mo
Battery Remaining Percentage: 77 %
Battery Voltage: 2.98 V
Brady Statistic AP VP Percent: 39 %
Brady Statistic AP VS Percent: 59 %
Brady Statistic AS VP Percent: 1 %
Brady Statistic AS VS Percent: 1 %
Brady Statistic RA Percent Paced: 95 %
Brady Statistic RV Percent Paced: 39 %
Date Time Interrogation Session: 20231024040019
Implantable Lead Connection Status: 753985
Implantable Lead Connection Status: 753985
Implantable Lead Implant Date: 20210416
Implantable Lead Implant Date: 20210416
Implantable Lead Location: 753859
Implantable Lead Location: 753860
Implantable Pulse Generator Implant Date: 20210416
Lead Channel Impedance Value: 350 Ohm
Lead Channel Impedance Value: 490 Ohm
Lead Channel Pacing Threshold Amplitude: 1 V
Lead Channel Pacing Threshold Amplitude: 1 V
Lead Channel Pacing Threshold Pulse Width: 0.5 ms
Lead Channel Pacing Threshold Pulse Width: 0.5 ms
Lead Channel Sensing Intrinsic Amplitude: 1 mV
Lead Channel Sensing Intrinsic Amplitude: 12 mV
Lead Channel Setting Pacing Amplitude: 2 V
Lead Channel Setting Pacing Amplitude: 2.5 V
Lead Channel Setting Pacing Pulse Width: 0.5 ms
Lead Channel Setting Sensing Sensitivity: 2 mV
Pulse Gen Model: 2272
Pulse Gen Serial Number: 3813093

## 2022-07-25 ENCOUNTER — Encounter (INDEPENDENT_AMBULATORY_CARE_PROVIDER_SITE_OTHER): Payer: Self-pay

## 2022-07-25 DIAGNOSIS — H353221 Exudative age-related macular degeneration, left eye, with active choroidal neovascularization: Secondary | ICD-10-CM | POA: Diagnosis not present

## 2022-07-28 DIAGNOSIS — Z85828 Personal history of other malignant neoplasm of skin: Secondary | ICD-10-CM | POA: Diagnosis not present

## 2022-07-28 DIAGNOSIS — D0439 Carcinoma in situ of skin of other parts of face: Secondary | ICD-10-CM | POA: Diagnosis not present

## 2022-07-28 DIAGNOSIS — L298 Other pruritus: Secondary | ICD-10-CM | POA: Diagnosis not present

## 2022-07-28 DIAGNOSIS — L821 Other seborrheic keratosis: Secondary | ICD-10-CM | POA: Diagnosis not present

## 2022-07-28 DIAGNOSIS — L570 Actinic keratosis: Secondary | ICD-10-CM | POA: Diagnosis not present

## 2022-07-28 DIAGNOSIS — D485 Neoplasm of uncertain behavior of skin: Secondary | ICD-10-CM | POA: Diagnosis not present

## 2022-07-28 DIAGNOSIS — Z85820 Personal history of malignant melanoma of skin: Secondary | ICD-10-CM | POA: Diagnosis not present

## 2022-07-28 DIAGNOSIS — Z872 Personal history of diseases of the skin and subcutaneous tissue: Secondary | ICD-10-CM | POA: Diagnosis not present

## 2022-07-28 DIAGNOSIS — L578 Other skin changes due to chronic exposure to nonionizing radiation: Secondary | ICD-10-CM | POA: Diagnosis not present

## 2022-07-28 DIAGNOSIS — B372 Candidiasis of skin and nail: Secondary | ICD-10-CM | POA: Diagnosis not present

## 2022-08-04 NOTE — Telephone Encounter (Signed)
Apologized for the delay in getting back with her. Informed of below recommendation/suggestion. Pt agreeable to restarting Lovastatin and not making a change to pravastatin. She appreciates my return call.

## 2022-08-08 NOTE — Progress Notes (Unsigned)
Cardiology Office Note  Date:  08/09/2022   ID:  Kerri Mills, DOB 07/24/1931, MRN [id number]  PCP:  Einar Pheasant, MD   Chief Complaint  Patient presents with   12 month follow up     Patient c/o shortness of breath with over exertion. Medications reviewed by the patient verbally.     HPI:  Kerri Mills is a 88 y.o. female with  persistent atrial fibrillation, on anticoagulation SSS, pacer hypertension,  hyperlipidemia,  Demand ischemia Shortness of breath LBBB,  OSA,  breast cancer s/p lumpectomy and XRT,  on the left Hospital admission September 30, 2017 for chest pain Evaluated by cardiology for atrial fibrillation with RVR and chest pain  She presents  for f/u of her chest pain and A. Fib.  LOV 11/22 Lives at [facility], Sedentary, no desire to do much No exercise program  Seen by EP 7-day monitor reviewed 22.1% ventricular ectopy  Started on amiodarone  Labs reviewed Total chol 142, 69  Neuropathy in feet On gabapentin 600 mg at night, on cymbalta  Chronic leg swelling, wears TEDS for 10 years Stable, mild, stable Lasix sparingly  Poor sleep, stays up late, falls asleep in the chair Missing nights with her CPAP  EKG personally reviewed by myself on todays visit Paced rate 82 bpm  Followed by Dr. Mortimer Fries,  pulmonary Needs CT scan, possible MAI on CT  Uses CPAP  January 08, 2020 when she was walking in the cafeteria at her assisted living facility and had sudden onset of shortness of breath and lightheadedness.  She was trying to get to the nearest table but suddenly lost consciousness  Pauses 6 sec on zio monitor off metoprolol  Implantation of a [employer] MRI  model [id number] (serial number  [id number] ) pacemaker on 01/10/20 by Dr. Curt Bears  Echo  12/10/2018 that showed mild wall thickening and bilateral bronchiectasis   hospital 09/30/2017 Nauseous chest pressure short of breath and tremulous.   called EMS and was found to be in atrial  fibrillation with rates in the 120s.   admitted at [facility] where she was in atrial fibrillation at 120 bpm.   LBBB was noted on EKG.   INR the day prior to admission was 3.1.   started on a diltiazem infusion  orthopnea overnight.  Bradycardia on diltiazem and metoprolol   PMH:   has a past medical history of Allergy, Anxiety, Arthritis, Atypical chest pain, Clotting disorder ([place]), Colitis, Depression, Diverticulitis (2013), Dysrhythmia, Gastric ulcer, GERD (gastroesophageal reflux disease), History of echocardiogram, Hypercholesterolemia, Hypertension, Hypothyroidism, Infiltrating lobular carcinoma of left breast (2011), Melanoma ([place]) (1997), Melanoma in situ of upper extremity ([place]) (03/19/2011), Persistent atrial fibrillation ([place]), Personal history of radiation therapy (2011), Presence of permanent cardiac pacemaker, Seroma, Sleep apnea, and Thyroid cancer ([place]) (1992).  PSH:    Past Surgical History:  Procedure Laterality Date   ABDOMINAL HYSTERECTOMY  1973   partial   BREAST BIOPSY Left 02-13-13   BENIGN BREAST TISSUE WITH CHANGES CONSISTENT WITH FAT NECROSIS   BREAST BIOPSY Left 01/21/2015   bx done in [facility] office 11:00 left 6-8cmfn   BREAST EXCISIONAL BIOPSY Left 1995   neg   BREAST EXCISIONAL BIOPSY Left 2011   Breast cancer radiation   BREAST LUMPECTOMY Left 2011   BREAST CA   CARDIAC CATHETERIZATION     CHOLECYSTECTOMY     COLONOSCOPY  2013   COLONOSCOPY WITH PROPOFOL N/A 06/09/2021   Procedure: COLONOSCOPY WITH PROPOFOL;  Surgeon: Vicente Males,  Bailey Mech, MD;  Location: [facility];  Service: Gastroenterology;  Laterality: N/A;   HAMMER TOE SURGERY Bilateral 02/24/2022   Procedure: HAMMER TOE CORRECTION 2, 3, 4 Right and 2nd Left;  Surgeon: Edrick Kins, DPM;  Location: [facility];  Service: Podiatry;  Laterality: Bilateral;   MELANOMA EXCISION     RT UPPER ARM   PACEMAKER IMPLANT N/A 01/10/2020   Procedure: PACEMAKER IMPLANT;  Surgeon: Constance Haw, MD;  Location: [facility];  Service: Cardiovascular;  Laterality: N/A;   PARTIAL HYSTERECTOMY     bleeding, ovaries in place.     THYROID SURGERY  1992   FOR THYROID CANCER   TONSILLECTOMY      Current Outpatient Medications  Medication Sig Dispense Refill   acetaminophen (TYLENOL) 650 MG CR tablet Take 650 mg by mouth every 8 (eight) hours as needed for pain.     albuterol (VENTOLIN HFA) 108 (90 Base) MCG/ACT inhaler Inhale 2 puffs into the lungs every 6 (six) hours as needed for wheezing or shortness of breath.     amiodarone (PACERONE) 200 MG tablet Take 1 tablet (200 mg total) by mouth daily. 30 tablet 3   azelastine (ASTELIN) 0.1 % nasal spray Place 1 spray into both nostrils 2 (two) times daily. Use in each nostril as directed 30 mL 0   cholecalciferol (VITAMIN D3) 25 MCG (1000 UNIT) tablet Take 2,000 Units by mouth daily.     DULoxetine (CYMBALTA) 60 MG capsule TAKE ONE CAPSULE AT BEDTIME 90 capsule 1   ELIQUIS 5 MG TABS tablet TAKE 1 TABLET BY MOUTH TWICE A DAY 180 tablet 1   furosemide (LASIX) 20 MG tablet TAKE 1 TABLET BY MOUTH DAILY AS NEEDED. TAKE WITH POTASSIUM. 90 tablet 3   gabapentin (NEURONTIN) 300 MG capsule TAKE 2 CAPSULES BY MOUTH AT BEDTIME 180 capsule 1   levothyroxine (SYNTHROID) 100 MCG tablet TAKE 1 TABLET EVERY DAY ON EMPTY STOMACHWITH A GLASS OF WATER AT LEAST 30-60 MINBEFORE BREAKFAST 90 tablet 1   lovastatin (MEVACOR) 40 MG tablet TAKE 1 TABLET BY MOUTH DAILY 90 tablet 3   metoprolol tartrate (LOPRESSOR) 50 MG tablet TAKE 1.5 TABLETS BY MOUTH TWICE DAILY. (Patient taking differently: Take 75 mg by mouth 2 (two) times daily.) 270 tablet 0   Multiple Vitamins-Minerals (PRESERVISION AREDS 2+MULTI VIT PO) Take 1 tablet by mouth 2 (two) times daily.     omeprazole (PRILOSEC) 20 MG capsule Take 1 capsule (20 mg total) by mouth daily. 90 capsule 3   Polyethyl Glycol-Propyl Glycol (SYSTANE OP) Place 1 drop into both eyes daily as needed (for dry eyes).     polyethylene glycol  powder (GLYCOLAX/MIRALAX) 17 GM/SCOOP powder Take 17 g by mouth daily. 850 g 1   potassium chloride (KLOR-CON) 10 MEQ tablet TAKE 2 TABLETS BY MOUTH DAILY AS NEEDED.WITH FUROSEMIDE. 60 tablet 1   Probiotic Product (ALIGN) 4 MG CAPS One capsule daily while on antibiotic and for two weeks after complete antibiotic 30 capsule 0   triamcinolone cream (KENALOG) 0.1 % Apply 1 application. topically 2 (two) times daily. 30 g 0   vitamin B-12 (CYANOCOBALAMIN) 1000 MCG tablet Take 1,000 mcg by mouth daily.     White Petrolatum-Mineral Oil (GENTEAL TEARS NIGHT-TIME OP) Place 1 application into both eyes at bedtime. Night time ointment 3.5g     No current facility-administered medications for this visit.    Allergies:   Lipitor [atorvastatin calcium] and Penicillins   Social History:  The patient  reports  that she has never smoked. She has never used smokeless tobacco. She reports current alcohol use. She reports that she does not use drugs.   Family History:   family history includes Cancer in her brother and sister; Heart disease in her mother.   Review of Systems  Constitutional: Negative.   HENT: Negative.    Eyes: Negative.   Respiratory: Negative.  Negative for shortness of breath.   Cardiovascular: Negative.   Gastrointestinal: Negative.   Genitourinary: Negative.   Musculoskeletal: Negative.   Neurological: Negative.  Negative for dizziness and headaches.  Psychiatric/Behavioral: Negative.    All other systems reviewed and are negative.   PHYSICAL EXAM: VS:  BP 128/80 (BP Location: Left Arm, Patient Position: Sitting, Cuff Size: Normal)   Pulse 82   Ht '5\' 5"'$  (1.651 m)   Wt 171 lb 6 oz (77.7 kg)   SpO2 94%   BMI 28.52 kg/m  , BMI Body mass index is 28.52 kg/m.  Constitutional:  oriented to person, place, and time. No distress.  HENT:  Head: Grossly normal Eyes:  no discharge. No scleral icterus.  Neck: No JVD, no carotid bruits  Cardiovascular: Regular rate and rhythm, no  murmurs appreciated Pulmonary/Chest: Clear to auscultation bilaterally, no wheezes or rails Abdominal: Soft.  no distension.  no tenderness.  Musculoskeletal: Normal range of motion Neurological:  normal muscle tone. Coordination normal. No atrophy Skin: Skin warm and dry Psychiatric: normal affect, pleasant   Recent Labs: 02/17/2022: Hemoglobin 13.9; Platelets 209 03/18/2022: ALT 13; BUN 12; Creatinine, Ser 0.81; Potassium 4.1; Sodium 140   Lipid Panel Lab Results  Component Value Date   CHOL 142 03/18/2022   HDL 38.00 (L) 03/18/2022   LDLCALC 69 03/18/2022   TRIG 175.0 (H) 03/18/2022    Wt Readings from Last 3 Encounters:  08/09/22 171 lb 6 oz (77.7 kg)  06/03/22 173 lb 6.4 oz (78.7 kg)  05/31/22 170 lb 9.6 oz (77.4 kg)     ASSESSMENT AND PLAN:  Persistent atrial fibrillation (HCC) - Plan: EKG 12-Lead Normal sinus rhythm, Paced rhythm, followed by EP Continue metoprolol  Frequent PVCs On amiodarone for high PVC burden Asymptomatic  Essential hypertension - Blood pressure is well controlled on today's visit. No changes made to the medications.  Shortness of breath sedentary Long discussion concerning need for regular walking program Reports that she is not motivated  Pure hypercholesterolemia -  Cholesterol is at goal on the current lipid regimen. No changes to the medications were made.    Total encounter time more than 30 minutes  Greater than 50% was spent in counseling and coordination of care with the patient    No orders of the defined types were placed in this encounter.     Signed, Esmond Plants, M.D., Ph.D. 08/09/2022  [place], [place]

## 2022-08-08 NOTE — Progress Notes (Signed)
Remote pacemaker transmission.   

## 2022-08-09 ENCOUNTER — Ambulatory Visit: Payer: PPO | Attending: Cardiovascular Disease | Admitting: Cardiovascular Disease

## 2022-08-09 ENCOUNTER — Encounter: Payer: Self-pay | Admitting: Cardiovascular Disease

## 2022-08-09 VITALS — BP 128/80 | HR 82 | Ht 65.0 in | Wt 171.4 lb

## 2022-08-09 DIAGNOSIS — I4819 Other persistent atrial fibrillation: Secondary | ICD-10-CM

## 2022-08-09 DIAGNOSIS — I495 Sick sinus syndrome: Secondary | ICD-10-CM

## 2022-08-09 DIAGNOSIS — I10 Essential (primary) hypertension: Secondary | ICD-10-CM | POA: Diagnosis not present

## 2022-08-09 DIAGNOSIS — I493 Ventricular premature depolarization: Secondary | ICD-10-CM | POA: Diagnosis not present

## 2022-08-09 DIAGNOSIS — R0789 Other chest pain: Secondary | ICD-10-CM

## 2022-08-09 DIAGNOSIS — R55 Syncope and collapse: Secondary | ICD-10-CM | POA: Diagnosis not present

## 2022-08-09 DIAGNOSIS — E7800 Pure hypercholesterolemia, unspecified: Secondary | ICD-10-CM

## 2022-08-09 NOTE — Patient Instructions (Signed)
Medication Instructions:  No changes  If you need a refill on your cardiac medications before your next appointment, please call your pharmacy.   Lab work: No new labs needed  Testing/Procedures: No new testing needed  Follow-Up: At [facility], you and your health needs are our priority.  As part of our continuing mission to provide you with exceptional heart care, we have created designated Provider Care Teams.  These Care Teams include your primary Cardiologist (physician) and Advanced Practice Providers (APPs -  Physician Assistants and Nurse Practitioners) who all work together to provide you with the care you need, when you need it.  You will need a follow up appointment in 12 months  Providers on your designated Care Team:   Christopher Berge, NP Ryan Dunn, PA-C Cadence Furth, PA-C  COVID-19 Vaccine Information can be found at: [email or web address] For questions related to vaccine distribution or appointments, please email [email or web address] or call [phone].   

## 2022-08-13 ENCOUNTER — Other Ambulatory Visit: Payer: Self-pay | Admitting: Internal Medicine

## 2022-08-16 ENCOUNTER — Telehealth: Payer: Self-pay | Admitting: Internal Medicine

## 2022-08-16 ENCOUNTER — Telehealth: Payer: Self-pay

## 2022-08-16 ENCOUNTER — Other Ambulatory Visit: Payer: Self-pay

## 2022-08-16 DIAGNOSIS — I10 Essential (primary) hypertension: Secondary | ICD-10-CM

## 2022-08-16 DIAGNOSIS — E7800 Pure hypercholesterolemia, unspecified: Secondary | ICD-10-CM

## 2022-08-16 DIAGNOSIS — R739 Hyperglycemia, unspecified: Secondary | ICD-10-CM

## 2022-08-16 NOTE — Telephone Encounter (Signed)
Attempted to contact patient to assess s/s. No answer, LMTCB.

## 2022-08-16 NOTE — Telephone Encounter (Signed)
Cv Remote Solution Alert.  [facility]lin alert for "V Percent Pacing Greater Than Limit" (>40% over 90 days)  and PMT detections (known).  Presenting rhythm ApVp.  Historically, RV pacing <5% with indication SND. October scheduled transmission showed 39%, today at 48%.  Routing for further review.  Routing to Dr. Curt Bears for review.

## 2022-08-16 NOTE — Telephone Encounter (Signed)
Orders placed.

## 2022-08-16 NOTE — Telephone Encounter (Signed)
Patient has a lab appt 08/25/2022, there are no orders in.

## 2022-08-17 ENCOUNTER — Other Ambulatory Visit: Payer: Self-pay | Admitting: Internal Medicine

## 2022-08-17 DIAGNOSIS — I4891 Unspecified atrial fibrillation: Secondary | ICD-10-CM

## 2022-08-17 NOTE — Telephone Encounter (Signed)
Pt with recent office visit with gen cards and no complaints.  Pt started amiodarone in October 2023.  No action needed.

## 2022-08-17 NOTE — Progress Notes (Signed)
Order placed for f/u lab.   

## 2022-08-25 ENCOUNTER — Other Ambulatory Visit (INDEPENDENT_AMBULATORY_CARE_PROVIDER_SITE_OTHER): Payer: PPO

## 2022-08-25 DIAGNOSIS — R739 Hyperglycemia, unspecified: Secondary | ICD-10-CM

## 2022-08-25 DIAGNOSIS — I4891 Unspecified atrial fibrillation: Secondary | ICD-10-CM | POA: Diagnosis not present

## 2022-08-25 DIAGNOSIS — E7800 Pure hypercholesterolemia, unspecified: Secondary | ICD-10-CM

## 2022-08-25 DIAGNOSIS — I10 Essential (primary) hypertension: Secondary | ICD-10-CM

## 2022-08-25 LAB — LIPID PANEL
Cholesterol: 167 mg/dL (ref 0–200)
HDL: 41.6 mg/dL (ref >39.00)
LDL Cholesterol: 89 mg/dL (ref 0–99)
NonHDL: 125.06
Total CHOL/HDL Ratio: 4
Triglycerides: 181 mg/dL — ABNORMAL HIGH (ref 0.0–149.0)
VLDL: 36.2 mg/dL (ref 0.0–40.0)

## 2022-08-25 LAB — CBC WITH DIFFERENTIAL/PLATELET
Basophils Absolute: 0.1 10*3/uL (ref 0.0–0.1)
Basophils Relative: 0.9 % (ref 0.0–3.0)
Eosinophils Absolute: 0.3 10*3/uL (ref 0.0–0.7)
Eosinophils Relative: 4.1 % (ref 0.0–5.0)
HCT: 41.4 % (ref 36.0–46.0)
Hemoglobin: 13.6 g/dL (ref 12.0–15.0)
Lymphocytes Relative: 25.5 % (ref 12.0–46.0)
Lymphs Abs: 1.9 10*3/uL (ref 0.7–4.0)
MCHC: 32.8 g/dL (ref 30.0–36.0)
MCV: 93.2 fl (ref 78.0–100.0)
Monocytes Absolute: 0.8 10*3/uL (ref 0.1–1.0)
Monocytes Relative: 10.2 % (ref 3.0–12.0)
Neutro Abs: 4.4 10*3/uL (ref 1.4–7.7)
Neutrophils Relative %: 59.3 % (ref 43.0–77.0)
Platelets: 230 10*3/uL (ref 150.0–400.0)
RBC: 4.45 Mil/uL (ref 3.87–5.11)
RDW: 13.3 % (ref 11.5–15.5)
WBC: 7.4 10*3/uL (ref 4.0–10.5)

## 2022-08-25 LAB — HEPATIC FUNCTION PANEL
ALT: 11 U/L (ref 0–35)
AST: 14 U/L (ref 0–37)
Albumin: 4.1 g/dL (ref 3.5–5.2)
Alkaline Phosphatase: 133 U/L — ABNORMAL HIGH (ref 39–117)
Bilirubin, Direct: 0.1 mg/dL (ref 0.0–0.3)
Total Bilirubin: 0.6 mg/dL (ref 0.2–1.2)
Total Protein: 6.8 g/dL (ref 6.0–8.3)

## 2022-08-25 LAB — BASIC METABOLIC PANEL
BUN: 15 mg/dL (ref 6–23)
CO2: 32 mEq/L (ref 19–32)
Calcium: 9.7 mg/dL (ref 8.4–10.5)
Chloride: 102 mEq/L (ref 96–112)
Creatinine, Ser: 0.98 mg/dL (ref 0.40–1.20)
GFR: 51.3 mL/min — ABNORMAL LOW (ref >60.00)
Glucose, Bld: 160 mg/dL — ABNORMAL HIGH (ref 70–99)
Potassium: 4.3 mEq/L (ref 3.5–5.1)
Sodium: 140 mEq/L (ref 135–145)

## 2022-08-25 LAB — HEMOGLOBIN A1C: Hgb A1c MFr Bld: 6.6 % — ABNORMAL HIGH (ref 4.6–6.5)

## 2022-08-29 DIAGNOSIS — H353221 Exudative age-related macular degeneration, left eye, with active choroidal neovascularization: Secondary | ICD-10-CM | POA: Diagnosis not present

## 2022-08-30 ENCOUNTER — Ambulatory Visit (INDEPENDENT_AMBULATORY_CARE_PROVIDER_SITE_OTHER): Payer: PPO | Admitting: Internal Medicine

## 2022-08-30 ENCOUNTER — Ambulatory Visit (INDEPENDENT_AMBULATORY_CARE_PROVIDER_SITE_OTHER): Payer: PPO

## 2022-08-30 ENCOUNTER — Encounter: Payer: Self-pay | Admitting: Internal Medicine

## 2022-08-30 VITALS — BP 126/80 | HR 83 | Temp 98.2°F | Resp 16 | Ht 65.0 in | Wt 168.8 lb

## 2022-08-30 DIAGNOSIS — R739 Hyperglycemia, unspecified: Secondary | ICD-10-CM

## 2022-08-30 DIAGNOSIS — R944 Abnormal results of kidney function studies: Secondary | ICD-10-CM | POA: Diagnosis not present

## 2022-08-30 DIAGNOSIS — R059 Cough, unspecified: Secondary | ICD-10-CM

## 2022-08-30 DIAGNOSIS — R748 Abnormal levels of other serum enzymes: Secondary | ICD-10-CM

## 2022-08-30 DIAGNOSIS — I700 Atherosclerosis of aorta: Secondary | ICD-10-CM | POA: Diagnosis not present

## 2022-08-30 DIAGNOSIS — J479 Bronchiectasis, uncomplicated: Secondary | ICD-10-CM

## 2022-08-30 DIAGNOSIS — D649 Anemia, unspecified: Secondary | ICD-10-CM | POA: Diagnosis not present

## 2022-08-30 DIAGNOSIS — I10 Essential (primary) hypertension: Secondary | ICD-10-CM | POA: Diagnosis not present

## 2022-08-30 DIAGNOSIS — F419 Anxiety disorder, unspecified: Secondary | ICD-10-CM

## 2022-08-30 DIAGNOSIS — E7800 Pure hypercholesterolemia, unspecified: Secondary | ICD-10-CM

## 2022-08-30 DIAGNOSIS — I4891 Unspecified atrial fibrillation: Secondary | ICD-10-CM | POA: Diagnosis not present

## 2022-08-30 DIAGNOSIS — K219 Gastro-esophageal reflux disease without esophagitis: Secondary | ICD-10-CM

## 2022-08-30 DIAGNOSIS — G4733 Obstructive sleep apnea (adult) (pediatric): Secondary | ICD-10-CM

## 2022-08-30 DIAGNOSIS — Z853 Personal history of malignant neoplasm of breast: Secondary | ICD-10-CM

## 2022-08-30 DIAGNOSIS — Z85850 Personal history of malignant neoplasm of thyroid: Secondary | ICD-10-CM

## 2022-08-30 DIAGNOSIS — E039 Hypothyroidism, unspecified: Secondary | ICD-10-CM

## 2022-08-30 LAB — BASIC METABOLIC PANEL
BUN: 13 mg/dL (ref 6–23)
CO2: 33 mEq/L — ABNORMAL HIGH (ref 19–32)
Calcium: 9.6 mg/dL (ref 8.4–10.5)
Chloride: 102 mEq/L (ref 96–112)
Creatinine, Ser: 0.87 mg/dL (ref 0.40–1.20)
GFR: 59.17 mL/min — ABNORMAL LOW (ref >60.00)
Glucose, Bld: 97 mg/dL (ref 70–99)
Potassium: 4.4 mEq/L (ref 3.5–5.1)
Sodium: 141 mEq/L (ref 135–145)

## 2022-08-30 MED ORDER — DOXYCYCLINE HYCLATE 100 MG PO TABS: 100.0000 mg | ORAL_TABLET | Freq: Two times a day (BID) | ORAL | 0 refills | Status: DC

## 2022-08-30 MED ORDER — PREDNISONE 10 MG PO TABS: ORAL_TABLET | ORAL | 0 refills | Status: DC

## 2022-08-30 NOTE — Patient Instructions (Signed)
Take a probiotic daily while on the antibitoic and for two weeks after completing the antibiotic.

## 2022-08-30 NOTE — Progress Notes (Signed)
Patient ID: Kerri Mills, female   DOB: 09/09/1931, 88 y.o.   MRN: [id number]   Subjective:    Patient ID: Kerri Mills, female    DOB: [birth year], 88 y.o.   MRN: [id number]   Patient here for  Chief Complaint  Patient presents with   Sinusitis   .   HPI Here for physical. Given acute issues, was changed to follow up appt.  Saw Dr Rockey Situ 08/09/22 - f/u afib, history of pacemaker.  Continue amiodarone.  No changes made. She reports she has been doing relatively well.  Has noticed some increased congestion - nasal.  Nose feels raw.  Occasional blood tinged mucus.  Uses saline this am.  No chest pain.  Increased cough - worse lying down.  Using cpap.  Taking allergy tab q am.  Discussed using cpap regularly.  Will occasionally fall asleep in her chair.     Past Medical History:  Diagnosis Date   Allergy    Anxiety    Arthritis    Atypical chest pain    a. 10/2018 MV: small, fixed apical defect possibly 2/2 attenuation artifact. No ischemia.  EF 68%.   Clotting disorder ([place])    Colitis    Depression    Diverticulitis 2013   Dysrhythmia    Gastric ulcer    GERD (gastroesophageal reflux disease)    History of echocardiogram    a. 09/2019 Echo: EF 55-60%, mod LVH. Mildly dil LA. Triv MR/TR.   Hypercholesterolemia    Hypertension    Hypothyroidism    Infiltrating lobular carcinoma of left breast 2011   T2,N0, ER: 90%; PR 0%; Her 2 neu not amplified. [facility]).   Melanoma ([place]) 1997   Melanoma in situ of upper extremity ([place]) 03/19/2011   Persistent atrial fibrillation ([place])    a. CHADS2VASc => 4 (HTN, age x 2, female)   Personal history of radiation therapy 2011   BREAST CA   Presence of permanent cardiac pacemaker    Seroma    HISTORY OF LFT BREAST   Sleep apnea    Thyroid cancer ([place]) 1992   Past Surgical History:  Procedure Laterality Date   ABDOMINAL HYSTERECTOMY  1973   partial   BREAST BIOPSY Left 02-13-13   BENIGN BREAST TISSUE WITH CHANGES CONSISTENT WITH  FAT NECROSIS   BREAST BIOPSY Left 01/21/2015   bx done in [facility] office 11:00 left 6-8cmfn   BREAST EXCISIONAL BIOPSY Left 1995   neg   BREAST EXCISIONAL BIOPSY Left 2011   Breast cancer radiation   BREAST LUMPECTOMY Left 2011   BREAST CA   CARDIAC CATHETERIZATION     CHOLECYSTECTOMY     COLONOSCOPY  2013   COLONOSCOPY WITH PROPOFOL N/A 06/09/2021   Procedure: COLONOSCOPY WITH PROPOFOL;  Surgeon: Jonathon Bellows, MD;  Location: [facility] ENDOSCOPY;  Service: Gastroenterology;  Laterality: N/A;   HAMMER TOE SURGERY Bilateral 02/24/2022   Procedure: HAMMER TOE CORRECTION 2, 3, 4 Right and 2nd Left;  Surgeon: Edrick Kins, DPM;  Location: [facility] ORS;  Service: Podiatry;  Laterality: Bilateral;   MELANOMA EXCISION     RT UPPER ARM   PACEMAKER IMPLANT N/A 01/10/2020   Procedure: PACEMAKER IMPLANT;  Surgeon: Constance Haw, MD;  Location: [facility];  Service: Cardiovascular;  Laterality: N/A;   PARTIAL HYSTERECTOMY     bleeding, ovaries in place.     THYROID SURGERY  1992   FOR THYROID CANCER   TONSILLECTOMY     Family  History  Problem Relation Age of Onset   Heart disease Mother    Cancer Brother        lung    Cancer Sister        breast   Breast cancer Neg Hx    Social History   Socioeconomic History   Marital status: Widowed    Spouse name: Not on file   Number of children: Not on file   Years of education: Not on file   Highest education level: Not on file  Occupational History   Not on file  Tobacco Use   Smoking status: Never   Smokeless tobacco: Never   Tobacco comments:    never  Vaping Use   Vaping Use: Never used  Substance and Sexual Activity   Alcohol use: Yes    Comment: once in a while   Drug use: No   Sexual activity: Never  Other Topics Concern   Not on file  Social History Narrative   Independent and baseline. Lives by herself   Social Determinants of Health   Financial Resource Strain: Low Risk  (02/08/2022)   Overall Financial Resource  Strain (CARDIA)    Difficulty of Paying Living Expenses: Not hard at all  Food Insecurity: No Food Insecurity (02/08/2022)   Hunger Vital Sign    Worried About Running Out of Food in the Last Year: Never true    [place] in the Last Year: Never true  Transportation Needs: No Transportation Needs (02/08/2022)   PRAPARE - Hydrologist (Medical): No    Lack of Transportation (Non-Medical): No  Physical Activity: Sufficiently Active (02/08/2022)   Exercise Vital Sign    Days of Exercise per Week: 4 days    Minutes of Exercise per Session: 40 min  Stress: No Stress Concern Present (02/08/2022)   [place]    Feeling of Stress : Only a little  Social Connections: Unknown (02/08/2022)   Social Connection and Isolation Panel [NHANES]    Frequency of Communication with Friends and Family: More than three times a week    Frequency of Social Gatherings with Friends and Family: More than three times a week    Attends Religious Services: Not on file    Active Member of Clubs or Organizations: Not on file    Attends Archivist Meetings: Not on file    Marital Status: Widowed     Review of Systems  Constitutional:  Negative for appetite change and unexpected weight change.  HENT:  Positive for congestion and postnasal drip.   Respiratory:  Negative for chest tightness and shortness of breath.        Some cough as outlined.   Cardiovascular:  Negative for chest pain, palpitations and leg swelling.  Gastrointestinal:  Negative for abdominal pain, diarrhea, nausea and vomiting.  Genitourinary:  Negative for difficulty urinating and dysuria.  Musculoskeletal:  Negative for joint swelling and myalgias.  Skin:  Negative for color change and rash.  Neurological:  Negative for dizziness and headaches.  Psychiatric/Behavioral:  Negative for agitation and dysphoric mood.        Objective:      BP 126/80 (BP Location: Left Arm, Patient Position: Sitting, Cuff Size: Small)   Pulse 83   Temp 98.2 F (36.8 C) (Temporal)   Resp 16   Ht _0  (1.651 m)   Wt 168 lb 12.8 oz (76.6 kg)   SpO2  98%   BMI 28.09 kg/m  Wt Readings from Last 3 Encounters:  08/30/22 168 lb 12.8 oz (76.6 kg)  08/09/22 171 lb 6 oz (77.7 kg)  06/03/22 173 lb 6.4 oz (78.7 kg)    Physical Exam Vitals reviewed.  Constitutional:      General: She is not in acute distress.    Appearance: Normal appearance.  HENT:     Head: Normocephalic and atraumatic.     Right Ear: External ear normal.     Left Ear: External ear normal.  Eyes:     General: No scleral icterus.       Right eye: No discharge.        Left eye: No discharge.     Conjunctiva/sclera: Conjunctivae normal.  Neck:     Thyroid: No thyromegaly.  Cardiovascular:     Rate and Rhythm: Normal rate and regular rhythm.  Pulmonary:     Effort: No respiratory distress.     Breath sounds: Normal breath sounds. No wheezing.  Abdominal:     General: Bowel sounds are normal.     Palpations: Abdomen is soft.     Tenderness: There is no abdominal tenderness.  Musculoskeletal:        General: No swelling or tenderness.     Cervical back: Neck supple. No tenderness.  Lymphadenopathy:     Cervical: No cervical adenopathy.  Skin:    Findings: No erythema or rash.  Neurological:     Mental Status: She is alert.  Psychiatric:        Mood and Affect: Mood normal.        Behavior: Behavior normal.      Outpatient Encounter Medications as of 08/30/2022  Medication Sig   acetaminophen (TYLENOL) 650 MG CR tablet Take 650 mg by mouth every 8 (eight) hours as needed for pain.   albuterol (VENTOLIN HFA) 108 (90 Base) MCG/ACT inhaler Inhale 2 puffs into the lungs every 6 (six) hours as needed for wheezing or shortness of breath.   amiodarone (PACERONE) 200 MG tablet Take 1 tablet (200 mg total) by mouth daily.   azelastine (ASTELIN) 0.1 % nasal spray  Place 1 spray into both nostrils 2 (two) times daily. Use in each nostril as directed   cholecalciferol (VITAMIN D3) 25 MCG (1000 UNIT) tablet Take 2,000 Units by mouth daily.   doxycycline (VIBRA-TABS) 100 MG tablet Take 1 tablet (100 mg total) by mouth 2 (two) times daily.   DULoxetine (CYMBALTA) 60 MG capsule TAKE ONE CAPSULE AT BEDTIME   ELIQUIS 5 MG TABS tablet TAKE 1 TABLET BY MOUTH TWICE A DAY   furosemide (LASIX) 20 MG tablet TAKE 1 TABLET BY MOUTH DAILY AS NEEDED. TAKE WITH POTASSIUM.   gabapentin (NEURONTIN) 300 MG capsule TAKE 2 CAPSULES BY MOUTH AT BEDTIME   levothyroxine (SYNTHROID) 100 MCG tablet TAKE 1 TABLET EVERY DAY ON EMPTY STOMACHWITH A GLASS OF WATER AT LEAST 30-60 MINBEFORE BREAKFAST   lovastatin (MEVACOR) 40 MG tablet TAKE 1 TABLET BY MOUTH DAILY   metoprolol tartrate (LOPRESSOR) 50 MG tablet TAKE 1.5 TABLETS BY MOUTH TWICE DAILY. (Patient taking differently: Take 75 mg by mouth 2 (two) times daily.)   Multiple Vitamins-Minerals (PRESERVISION AREDS 2+MULTI VIT PO) Take 1 tablet by mouth 2 (two) times daily.   omeprazole (PRILOSEC) 20 MG capsule TAKE 1 CAPSULE BY MOUTH ONCE DAILY   Polyethyl Glycol-Propyl Glycol (SYSTANE OP) Place 1 drop into both eyes daily as needed (for dry eyes).   polyethylene glycol powder (  GLYCOLAX/MIRALAX) 17 GM/SCOOP powder Take 17 g by mouth daily.   potassium chloride (KLOR-CON) 10 MEQ tablet TAKE 2 TABLETS BY MOUTH DAILY AS NEEDED.WITH FUROSEMIDE.   predniSONE (DELTASONE) 10 MG tablet Take 4 tablets x 1 day and then decrease by 1/2 tablet per day until down to zero mg   Probiotic Product (ALIGN) 4 MG CAPS One capsule daily while on antibiotic and for two weeks after complete antibiotic   triamcinolone cream (KENALOG) 0.1 % Apply 1 application. topically 2 (two) times daily.   vitamin B-12 (CYANOCOBALAMIN) 1000 MCG tablet Take 1,000 mcg by mouth daily.   White Petrolatum-Mineral Oil (GENTEAL TEARS NIGHT-TIME OP) Place 1 application into both eyes  at bedtime. Night time ointment 3.5g   No facility-administered encounter medications on file as of 08/30/2022.     Lab Results  Component Value Date   WBC 7.4 08/25/2022   HGB 13.6 08/25/2022   HCT 41.4 08/25/2022   PLT 230.0 08/25/2022   GLUCOSE 97 08/30/2022   CHOL 167 08/25/2022   TRIG 181.0 (H) 08/25/2022   HDL 41.60 08/25/2022   LDLDIRECT 90.0 03/15/2021   LDLCALC 89 08/25/2022   ALT 11 08/25/2022   AST 14 08/25/2022   NA 141 08/30/2022   K 4.4 08/30/2022   CL 102 08/30/2022   CREATININE 0.87 08/30/2022   BUN 13 08/30/2022   CO2 33 (H) 08/30/2022   TSH 1.39 05/12/2021   INR 1.5 (H) 11/21/2018   HGBA1C 6.6 (H) 08/25/2022    CT CHEST WO CONTRAST  Result Date: 05/05/2022 CLINICAL DATA:  Lung nodule, shortness of breath on exertion, productive cough EXAM: CT CHEST WITHOUT CONTRAST TECHNIQUE: Multidetector CT imaging of the chest was performed following the standard protocol without IV contrast. RADIATION DOSE REDUCTION: This exam was performed according to the departmental dose-optimization program which includes automated exposure control, adjustment of the mA and/or kV according to patient size and/or use of iterative reconstruction technique. COMPARISON:  CT done on 05/04/2021, chest radiograph done on 11/29/2021 FINDINGS: Cardiovascular: Coronary artery calcifications are seen. There is ectasia of ascending thoracic aorta measuring 4 cm. There is ectasia of main pulmonary artery measuring 3.9 cm suggesting pulmonary arterial hypertension. Pacer leads are noted in place. Pacemaker battery is seen in the left infraclavicular region. Mediastinum/Nodes: No new significant lymphadenopathy is seen in mediastinum. Lungs/Pleura: Bronchiectasis is seen. There are linear patchy densities in the anterior segment of the right upper lobe and right middle lobe with no significant change. Pleural thickening seen in the left anterior lower lung field may be due to scarring from previous  radiation treatment. There is interval decrease in number of nodules in right upper lobe and right lower lobe. Largest of the residual nodules in the right lung he is in right lower lobe measuring 5 mm. Small foci of tree in bud appearance in the lateral aspect of right middle lobe and right lower lobe appear less prominent. This may suggest residual scarring. There are no new discrete nodules. There is no new focal pulmonary consolidation. Upper Abdomen: Surgical clips are seen in gallbladder fossa. Small hiatal hernia is seen. Musculoskeletal: There is previous left breast surgery with residual lobulated soft tissue density measuring approximally 8.4 x 2.2 cm which measured 6.6 x 2.6 cm in the previous study. There are multiple coarse calcifications in the area of surgery in the left anterior chest wall Coarse trabeculae seen in few of the thoracic vertebral bodies may suggest hemangiomas. As far as seen, there are no new discrete  lytic or sclerotic lesions in the bony structures. IMPRESSION: There is interval decrease in size and number of nodules in the lung fields. Largest of the nodules in the current study measures 5 mm in right lower lobe. Findings may suggest resolving inflammatory/infectious process. There are no new nodules or new focal infiltrates. Bronchiectasis. There are small scattered foci of scarring. No new significant lymphadenopathy is seen. There is no pleural effusion. Postsurgical changes with scarring and coarse calcifications are seen in left anterior chest wall with interval increase in size. This may suggest progression of fibrotic change or local recurrence of neoplastic process. Coronary artery calcifications are seen. There is ectasia of ascending thoracic aorta measuring 4 cm. There is ectasia of the main pulmonary artery suggesting pulmonary arterial hypertension. Electronically Signed   By: Elmer Picker M.D.   On: 05/05/2022 20:27       Assessment & Plan:   Problem List  Items Addressed This Visit     Anemia    Follow cbc.       Anxiety    Continues on cymbalta. Stable.       Aortic atherosclerosis (HCC)    On lovastatin.       Atrial fibrillation [facility])    S/p pacemaker placement.  On eliquis and metoprolol.  Continues amiodarone. Overall appears to be stable.      Bronchiectasis without complication (HCC)    Saw Dr Mortimer Fries with recommendations: Bronchiectasis without complication ([place]). Continue flutter valve as tolerated. Repeat CT chest in 3 months Restart Flutter valve Three times a day  .  Mucinex Twice daily  As needed  cough/congestion  Albuterol inhaler 1-2 puffs every 6hr as needed for wheezing /shortness of breath.  Breathing overall stable.  Need to treat acute issues.       Relevant Orders   DG Chest 2 View (Completed)   Cough - Primary    Increased cough and congestion as outlined.  Saline nasal spray.  Treat with prednisone taper and doxycycline as directed.  Check cxr.  Call with update.       Relevant Orders   DG Chest 2 View (Completed)   Elevated alkaline phosphatase level    Follow liver panel.       Essential hypertension    On metoprolol and lasix prn.   Blood pressure as outlined.  Continue current medication regimen. Hold on making changes.   Follow pressures.  Follow metabolic panel.       Relevant Orders   Basic metabolic panel   GERD    Upper symptoms appear to be controlled on omeprazole.       History of breast cancer    01/10/22 - [place].  Evaluated by Dr Bary Castilla 01/20/22 - recommended yearly f/u - Dr Windell Moment.       History of thyroid cancer    On thyroid replacement.  Follow tsh.       Relevant Orders   TSH   Hypercholesterolemia    On lovastatin.  Continue diet and exercise.  Follow fasting profile and liver panel.       Relevant Orders   Hepatic function panel   Lipid panel   Hyperglycemia    Low carb diet and exercise.  Follow met b and a1c.        Relevant Orders   Hemoglobin A1c    Hypothyroid    On thyroid replacement.  Follow tsh.       Obstructive sleep apnea    Continue cpap. Discussed  using regularly.        Other Visit Diagnoses     Decreased GFR       Relevant Orders   Basic metabolic panel (Completed)        Einar Pheasant, MD

## 2022-09-01 ENCOUNTER — Telehealth: Payer: Self-pay | Admitting: Internal Medicine

## 2022-09-01 NOTE — Telephone Encounter (Signed)
Pt returning call to cma

## 2022-09-04 ENCOUNTER — Encounter: Payer: Self-pay | Admitting: Internal Medicine

## 2022-09-04 NOTE — Assessment & Plan Note (Signed)
Low carb diet and exercise.  Follow met b and a1c.   

## 2022-09-04 NOTE — Assessment & Plan Note (Signed)
On metoprolol and lasix prn.   Blood pressure as outlined.  Continue current medication regimen. Hold on making changes.   Follow pressures.  Follow metabolic panel.  

## 2022-09-04 NOTE — Assessment & Plan Note (Signed)
On lovastatin 

## 2022-09-04 NOTE — Assessment & Plan Note (Signed)
01/10/22 - Birads I.  Evaluated by Dr Byrnett 01/20/22 - recommended yearly f/u - Dr Cintron-Diaz.  

## 2022-09-04 NOTE — Assessment & Plan Note (Signed)
On thyroid replacement.  Follow tsh.  

## 2022-09-04 NOTE — Assessment & Plan Note (Signed)
Follow cbc.  

## 2022-09-04 NOTE — Assessment & Plan Note (Signed)
S/p pacemaker placement.  On eliquis and metoprolol.  Continues amiodarone. Overall appears to be stable.

## 2022-09-04 NOTE — Assessment & Plan Note (Signed)
On lovastatin.  Continue diet and exercise.  Follow fasting profile and liver panel.  

## 2022-09-04 NOTE — Assessment & Plan Note (Signed)
Upper symptoms appear to be controlled on omeprazole.  

## 2022-09-04 NOTE — Assessment & Plan Note (Signed)
Continue cpap. Discussed using regularly.   

## 2022-09-04 NOTE — Assessment & Plan Note (Signed)
Continues on cymbalta. Stable.

## 2022-09-04 NOTE — Assessment & Plan Note (Signed)
Increased cough and congestion as outlined.  Saline nasal spray.  Treat with prednisone taper and doxycycline as directed.  Check cxr.  Call with update.

## 2022-09-04 NOTE — Assessment & Plan Note (Signed)
Follow liver panel.  

## 2022-09-04 NOTE — Assessment & Plan Note (Signed)
Saw Dr Mortimer Fries with recommendations: Bronchiectasis without complication ([place]). Continue flutter valve as tolerated. Repeat CT chest in 3 months Restart Flutter valve Three times a day  .  Mucinex Twice daily  As needed  cough/congestion  Albuterol inhaler 1-2 puffs every 6hr as needed for wheezing /shortness of breath.  Breathing overall stable.  Need to treat acute issues.

## 2022-09-06 ENCOUNTER — Other Ambulatory Visit: Payer: Self-pay | Admitting: Internal Medicine

## 2022-10-03 ENCOUNTER — Other Ambulatory Visit: Payer: Self-pay | Admitting: Cardiology

## 2022-10-10 ENCOUNTER — Encounter: Payer: Self-pay | Admitting: Nurse Practitioner

## 2022-10-10 ENCOUNTER — Other Ambulatory Visit: Payer: Self-pay | Admitting: Cardiovascular Disease

## 2022-10-10 ENCOUNTER — Telehealth: Payer: PPO | Admitting: Family Medicine

## 2022-10-10 ENCOUNTER — Encounter: Payer: PPO | Admitting: Nurse Practitioner

## 2022-10-10 NOTE — Telephone Encounter (Signed)
Prescription refill request for Eliquis received. Indication: PAF Last office visit: 08/09/22  Johnny Bridge MD Scr: 0.87 on 08/30/22 Age: 89 Weight: 77.7kg  Based on above findings Eliquis '5mg'$  twice daily is the appropriate dose.  Refill approved.

## 2022-10-10 NOTE — Telephone Encounter (Signed)
Refill request

## 2022-10-11 ENCOUNTER — Inpatient Hospital Stay
Admission: EM | Admit: 2022-10-11 | Discharge: 2022-10-14 | DRG: 193 | Disposition: A | Discharge status: Home Health | Payer: PPO | Attending: Internal Medicine | Admitting: Internal Medicine

## 2022-10-11 ENCOUNTER — Other Ambulatory Visit: Payer: Self-pay

## 2022-10-11 ENCOUNTER — Emergency Department: Payer: PPO

## 2022-10-11 ENCOUNTER — Encounter: Payer: Self-pay | Admitting: Emergency Medicine

## 2022-10-11 ENCOUNTER — Encounter: Payer: Self-pay | Admitting: Nurse Practitioner

## 2022-10-11 ENCOUNTER — Ambulatory Visit: Payer: PPO | Admitting: Nurse Practitioner

## 2022-10-11 VITALS — Ht 65.0 in | Wt 168.0 lb

## 2022-10-11 DIAGNOSIS — I495 Sick sinus syndrome: Secondary | ICD-10-CM | POA: Diagnosis not present

## 2022-10-11 DIAGNOSIS — Z85820 Personal history of malignant melanoma of skin: Secondary | ICD-10-CM

## 2022-10-11 DIAGNOSIS — Z801 Family history of malignant neoplasm of trachea, bronchus and lung: Secondary | ICD-10-CM | POA: Diagnosis not present

## 2022-10-11 DIAGNOSIS — K449 Diaphragmatic hernia without obstruction or gangrene: Secondary | ICD-10-CM | POA: Diagnosis not present

## 2022-10-11 DIAGNOSIS — Z85850 Personal history of malignant neoplasm of thyroid: Secondary | ICD-10-CM | POA: Diagnosis not present

## 2022-10-11 DIAGNOSIS — Z79890 Hormone replacement therapy (postmenopausal): Secondary | ICD-10-CM

## 2022-10-11 DIAGNOSIS — Z66 Do not resuscitate: Secondary | ICD-10-CM | POA: Diagnosis present

## 2022-10-11 DIAGNOSIS — E7800 Pure hypercholesterolemia, unspecified: Secondary | ICD-10-CM | POA: Diagnosis not present

## 2022-10-11 DIAGNOSIS — I10 Essential (primary) hypertension: Secondary | ICD-10-CM

## 2022-10-11 DIAGNOSIS — J9601 Acute respiratory failure with hypoxia: Secondary | ICD-10-CM

## 2022-10-11 DIAGNOSIS — R0602 Shortness of breath: Secondary | ICD-10-CM | POA: Diagnosis not present

## 2022-10-11 DIAGNOSIS — J1000 Influenza due to other identified influenza virus with unspecified type of pneumonia: Secondary | ICD-10-CM | POA: Diagnosis not present

## 2022-10-11 DIAGNOSIS — Z853 Personal history of malignant neoplasm of breast: Secondary | ICD-10-CM

## 2022-10-11 DIAGNOSIS — E039 Hypothyroidism, unspecified: Secondary | ICD-10-CM | POA: Diagnosis present

## 2022-10-11 DIAGNOSIS — I4891 Unspecified atrial fibrillation: Secondary | ICD-10-CM

## 2022-10-11 DIAGNOSIS — J1282 Pneumonia due to coronavirus disease 2019: Secondary | ICD-10-CM | POA: Diagnosis not present

## 2022-10-11 DIAGNOSIS — R059 Cough, unspecified: Secondary | ICD-10-CM | POA: Diagnosis present

## 2022-10-11 DIAGNOSIS — J101 Influenza due to other identified influenza virus with other respiratory manifestations: Secondary | ICD-10-CM | POA: Diagnosis not present

## 2022-10-11 DIAGNOSIS — G4733 Obstructive sleep apnea (adult) (pediatric): Secondary | ICD-10-CM | POA: Diagnosis not present

## 2022-10-11 DIAGNOSIS — Z90710 Acquired absence of both cervix and uterus: Secondary | ICD-10-CM

## 2022-10-11 DIAGNOSIS — J471 Bronchiectasis with (acute) exacerbation: Secondary | ICD-10-CM | POA: Diagnosis not present

## 2022-10-11 DIAGNOSIS — Z79899 Other long term (current) drug therapy: Secondary | ICD-10-CM

## 2022-10-11 DIAGNOSIS — J09X1 Influenza due to identified novel influenza A virus with pneumonia: Secondary | ICD-10-CM

## 2022-10-11 DIAGNOSIS — I4819 Other persistent atrial fibrillation: Secondary | ICD-10-CM | POA: Diagnosis not present

## 2022-10-11 DIAGNOSIS — K219 Gastro-esophageal reflux disease without esophagitis: Secondary | ICD-10-CM | POA: Diagnosis present

## 2022-10-11 DIAGNOSIS — J9691 Respiratory failure, unspecified with hypoxia: Secondary | ICD-10-CM | POA: Diagnosis present

## 2022-10-11 DIAGNOSIS — Z1152 Encounter for screening for COVID-19: Secondary | ICD-10-CM | POA: Diagnosis not present

## 2022-10-11 DIAGNOSIS — Z923 Personal history of irradiation: Secondary | ICD-10-CM

## 2022-10-11 DIAGNOSIS — J111 Influenza due to unidentified influenza virus with other respiratory manifestations: Secondary | ICD-10-CM

## 2022-10-11 DIAGNOSIS — Z8249 Family history of ischemic heart disease and other diseases of the circulatory system: Secondary | ICD-10-CM | POA: Diagnosis not present

## 2022-10-11 DIAGNOSIS — Z950 Presence of cardiac pacemaker: Secondary | ICD-10-CM | POA: Diagnosis not present

## 2022-10-11 LAB — CBC WITH DIFFERENTIAL/PLATELET
Abs Immature Granulocytes: 0.03 10*3/uL (ref 0.00–0.07)
Basophils Absolute: 0 10*3/uL (ref 0.0–0.1)
Basophils Relative: 1 %
Eosinophils Absolute: 0 10*3/uL (ref 0.0–0.5)
Eosinophils Relative: 0 %
HCT: 40.5 % (ref 36.0–46.0)
Hemoglobin: 12.6 g/dL (ref 12.0–15.0)
Immature Granulocytes: 1 %
Lymphocytes Relative: 17 %
Lymphs Abs: 0.9 10*3/uL (ref 0.7–4.0)
MCH: 29.9 pg (ref 26.0–34.0)
MCHC: 31.1 g/dL (ref 30.0–36.0)
MCV: 96 fL (ref 80.0–100.0)
Monocytes Absolute: 0.7 10*3/uL (ref 0.1–1.0)
Monocytes Relative: 14 %
Neutro Abs: 3.6 10*3/uL (ref 1.7–7.7)
Neutrophils Relative %: 67 %
Platelets: 154 10*3/uL (ref 150–400)
RBC: 4.22 MIL/uL (ref 3.87–5.11)
RDW: 13.2 % (ref 11.5–15.5)
WBC: 5.2 10*3/uL (ref 4.0–10.5)
nRBC: 0 % (ref 0.0–0.2)

## 2022-10-11 LAB — COMPREHENSIVE METABOLIC PANEL
ALT: 22 U/L (ref 0–44)
AST: 30 U/L (ref 15–41)
Albumin: 3.3 g/dL — ABNORMAL LOW (ref 3.5–5.0)
Alkaline Phosphatase: 108 U/L (ref 38–126)
Anion gap: 8 (ref 5–15)
BUN: 14 mg/dL (ref 8–23)
CO2: 23 mmol/L (ref 22–32)
Calcium: 8.9 mg/dL (ref 8.9–10.3)
Chloride: 104 mmol/L (ref 98–111)
Creatinine, Ser: 0.91 mg/dL (ref 0.44–1.00)
GFR, Estimated: >60 mL/min (ref >60)
Glucose, Bld: 146 mg/dL — ABNORMAL HIGH (ref 70–99)
Potassium: 3.9 mmol/L (ref 3.5–5.1)
Sodium: 135 mmol/L (ref 135–145)
Total Bilirubin: 0.6 mg/dL (ref 0.3–1.2)
Total Protein: 6.4 g/dL — ABNORMAL LOW (ref 6.5–8.1)

## 2022-10-11 LAB — RESP PANEL BY RT-PCR (RSV, FLU A&B, COVID)  RVPGX2
Influenza A by PCR: POSITIVE — AB
Influenza B by PCR: NEGATIVE
Resp Syncytial Virus by PCR: NEGATIVE
SARS Coronavirus 2 by RT PCR: NEGATIVE

## 2022-10-11 LAB — EXPECTORATED SPUTUM ASSESSMENT W GRAM STAIN, RFLX TO RESP C

## 2022-10-11 LAB — STREP PNEUMONIAE URINARY ANTIGEN: Strep Pneumo Urinary Antigen: NEGATIVE

## 2022-10-11 LAB — PROCALCITONIN: Procalcitonin: <0.1 ng/mL

## 2022-10-11 MED ORDER — IOHEXOL 350 MG/ML SOLN
75.0000 mL | Freq: Once | INTRAVENOUS | Status: AC | PRN
  Administered 2022-10-11: 75 mL via INTRAVENOUS

## 2022-10-11 MED ORDER — DULOXETINE HCL 30 MG PO CPEP
60.0000 mg | ORAL_CAPSULE | Freq: Every day | ORAL | Status: DC
  Administered 2022-10-11 – 2022-10-13 (×3): 60 mg via ORAL
  Filled 2022-10-11 (×3): qty 2

## 2022-10-11 MED ORDER — SODIUM CHLORIDE 0.9 % IV BOLUS
1000.0000 mL | Freq: Once | INTRAVENOUS | Status: AC
  Administered 2022-10-11: 1000 mL via INTRAVENOUS

## 2022-10-11 MED ORDER — OSELTAMIVIR PHOSPHATE 75 MG PO CAPS
75.0000 mg | ORAL_CAPSULE | Freq: Once | ORAL | Status: AC
  Administered 2022-10-11: 75 mg via ORAL
  Filled 2022-10-11: qty 1

## 2022-10-11 MED ORDER — PRAVASTATIN SODIUM 20 MG PO TABS
40.0000 mg | ORAL_TABLET | Freq: Every day | ORAL | Status: DC
  Administered 2022-10-12 – 2022-10-13 (×2): 40 mg via ORAL
  Filled 2022-10-11 (×2): qty 2

## 2022-10-11 MED ORDER — APIXABAN 5 MG PO TABS
5.0000 mg | ORAL_TABLET | Freq: Two times a day (BID) | ORAL | Status: DC
  Administered 2022-10-11 – 2022-10-14 (×6): 5 mg via ORAL
  Filled 2022-10-11 (×6): qty 1

## 2022-10-11 MED ORDER — AMIODARONE HCL 200 MG PO TABS: 200.0000 mg | ORAL_TABLET | Freq: Every day | ORAL | Status: DC

## 2022-10-11 MED ORDER — PANTOPRAZOLE SODIUM 40 MG PO TBEC: 40.0000 mg | DELAYED_RELEASE_TABLET | Freq: Every day | ORAL | Status: DC

## 2022-10-11 MED ORDER — IOHEXOL 350 MG/ML SOLN: 80.0000 mL | Freq: Once | INTRAVENOUS | Status: DC | PRN

## 2022-10-11 MED ORDER — LEVOFLOXACIN IN D5W 750 MG/150ML IV SOLN
750.0000 mg | INTRAVENOUS | Status: DC
  Administered 2022-10-12: 750 mg via INTRAVENOUS
  Filled 2022-10-11: qty 150

## 2022-10-11 MED ORDER — METOPROLOL TARTRATE 50 MG PO TABS
75.0000 mg | ORAL_TABLET | Freq: Two times a day (BID) | ORAL | Status: DC
  Administered 2022-10-11 – 2022-10-14 (×6): 75 mg via ORAL
  Filled 2022-10-11 (×6): qty 1

## 2022-10-11 MED ORDER — ALBUTEROL SULFATE (2.5 MG/3ML) 0.083% IN NEBU: 3.0000 mL | INHALATION_SOLUTION | Freq: Four times a day (QID) | RESPIRATORY_TRACT | Status: DC | PRN

## 2022-10-11 MED ORDER — GABAPENTIN 300 MG PO CAPS
600.0000 mg | ORAL_CAPSULE | Freq: Every day | ORAL | Status: DC
  Administered 2022-10-11 – 2022-10-13 (×3): 600 mg via ORAL
  Filled 2022-10-11 (×3): qty 2

## 2022-10-11 MED ORDER — LEVOTHYROXINE SODIUM 100 MCG PO TABS
100.0000 ug | ORAL_TABLET | Freq: Every day | ORAL | Status: DC
  Administered 2022-10-12 – 2022-10-14 (×3): 100 ug via ORAL
  Filled 2022-10-11 (×3): qty 1

## 2022-10-11 MED ORDER — SODIUM CHLORIDE 3 % IN NEBU
4.0000 mL | INHALATION_SOLUTION | Freq: Every day | RESPIRATORY_TRACT | Status: AC
  Administered 2022-10-11 – 2022-10-13 (×3): 4 mL via RESPIRATORY_TRACT
  Filled 2022-10-11 (×3): qty 4

## 2022-10-11 NOTE — Assessment & Plan Note (Signed)
Rate controlled at this time with AV pacing noted.  -Continue home metoprolol, amiodarone and Eliquis

## 2022-10-11 NOTE — ED Notes (Signed)
Pt taken off O2 by provider  O2 sat dropped to 89%

## 2022-10-11 NOTE — ED Provider Notes (Signed)
[facility] Provider Note    Event Date/Time   First MD Initiated Contact with Patient 10/11/22 1126     (approximate)   History   Chief Complaint Cough   HPI Kerri Mills is a 89 y.o. female, history of GERD, atrial fibrillation, arthritis, anxiety, presents to the emergency department for evaluation of shortness of breath.  Patient states that she has been feeling sick for the past 7 days, endorsing cough, congestion, and chest tightness.  Initially reports extreme fatigue.  Yesterday, she was found to have a oxygen saturation in the 80s while she was talking/walking.  She was advised by her doctor to report to the emergency department for further evaluation.  Denies abdominal pain, flank pain, nausea/vomiting, diarrhea, urinary symptoms, headache, weakness, rash/lesions, or dizziness/lightheadedness.  History Limitations: No limitations.        Physical Exam  Triage Vital Signs: ED Triage Vitals  Enc Vitals Group     BP 10/11/22 1053 139/80     Pulse Rate 10/11/22 1053 80     Resp 10/11/22 1053 18     Temp 10/11/22 1053 98.8 F (37.1 C)     Temp Source 10/11/22 1053 Oral     SpO2 10/11/22 1053 91 %     Weight 10/11/22 1126 167 lb 15.9 oz (76.2 kg)     Height 10/11/22 1126 '5\' 5"'$  (1.651 m)     Head Circumference --      Peak Flow --      Pain Score 10/11/22 1048 6     Pain Loc --      Pain Edu? --      Excl. in [place]? --     Most recent vital signs: Vitals:   10/11/22 1329 10/11/22 1430  BP: 120/88 (!) 123/91  Pulse: 88 82  Resp: 20   Temp:    SpO2: (!) 89% 100%    General: Awake, NAD.  Skin: Warm, dry. No rashes or lesions.  Eyes: PERRL. Conjunctivae normal.  CV: Good peripheral perfusion.  Resp: Normal effort.  Moderate rhonchi/wheezing bilaterally in the apices to bases. Abd: Soft, non-tender. No distention.  Neuro: At baseline. No gross neurological deficits.  Musculoskeletal: Normal ROM of all extremities.   Physical  Exam    ED Results / Procedures / Treatments  Labs (all labs ordered are listed, but only abnormal results are displayed) Labs Reviewed  RESP PANEL BY RT-PCR (RSV, FLU A&B, COVID)  RVPGX2 - Abnormal; Notable for the following components:      Result Value   Influenza A by PCR POSITIVE (*)    All other components within normal limits  COMPREHENSIVE METABOLIC PANEL - Abnormal; Notable for the following components:   Glucose, Bld 146 (*)    Total Protein 6.4 (*)    Albumin 3.3 (*)    All other components within normal limits  EXPECTORATED SPUTUM ASSESSMENT W GRAM STAIN, RFLX TO RESP C  CBC WITH DIFFERENTIAL/PLATELET  PROCALCITONIN  STREP PNEUMONIAE URINARY ANTIGEN  LEGIONELLA PNEUMOPHILA SEROGP 1 UR AG     EKG AV dual paced rhythm, rate of 81, no significant abnormalities.    RADIOLOGY  ED Provider Interpretation: I personally viewed and interpreted these images, CT angio shows no evidence of pulmonary embolism.  Patchy groundglass opacities throughout both lungs.  CT Angio Chest PE W and/or Wo Contrast  Result Date: 10/11/2022 CLINICAL DATA:  Pulmonary embolism suspected, high probability. Flu-like symptoms shortness of breath. EXAM: CT ANGIOGRAPHY CHEST WITH CONTRAST TECHNIQUE:  Multidetector CT imaging of the chest was performed using the standard protocol during bolus administration of intravenous contrast. Multiplanar CT image reconstructions and MIPs were obtained to evaluate the vascular anatomy. RADIATION DOSE REDUCTION: This exam was performed according to the departmental dose-optimization program which includes automated exposure control, adjustment of the mA and/or kV according to patient size and/or use of iterative reconstruction technique. CONTRAST:  74m OMNIPAQUE IOHEXOL 350 MG/ML SOLN COMPARISON:  Chest CT dated 05/05/2022. FINDINGS: Cardiovascular: Some of the most peripheral segmental and subsegmental pulmonary artery branches are difficult to definitively  characterize due to patient breathing motion artifact, however, there is no pulmonary embolism identified within the main, lobar or segmental pulmonary arteries bilaterally. No thoracic aortic aneurysm. Scattered aortic atherosclerosis. No pericardial effusion. Scattered coronary artery calcifications. Mediastinum/Nodes: No mass or enlarged lymph nodes are seen within the mediastinum or perihilar regions. Esophagus is unremarkable. Small hiatal hernia within the lower mediastinum. Trachea and central bronchi are unremarkable. Lungs/Pleura: Patchy ground-glass opacities throughout both lungs. No pleural effusion or pneumothorax. Upper Abdomen: Limited images of the upper abdomen are unremarkable. Musculoskeletal: Degenerative spondylosis of the slightly kyphotic thoracic spine. No acute-appearing osseous abnormality. Review of the MIP images confirms the above findings. IMPRESSION: 1. No pulmonary embolism identified, with mild study limitations detailed above. 2. Patchy ground-glass opacities throughout both lungs. Differential includes atypical/viral pneumonias, interstitial pneumonias, edema related to volume overload/CHF, hypersensitivity pneumonitis, and respiratory bronchiolitis. COVID pneumonia can have this appearance. 3. Small hiatal hernia. 4. Coronary artery calcifications. Electronically Signed   By: SFranki CabotM.D.   On: 10/11/2022 13:13   DG Chest 2 View  Result Date: 10/11/2022 CLINICAL DATA:  Provided history: Shortness of breath. Additional history provided: Flu like symptoms. EXAM: CHEST - 2 VIEW COMPARISON:  Prior chest radiographs 08/30/2022 and earlier. FINDINGS: Left chest multilead implantable cardiac device. Borderline cardiomegaly, unchanged. Aortic atherosclerosis. No appreciable airspace consolidation or pulmonary edema. No evidence of pleural effusion or pneumothorax. Thoracic spondylosis. IMPRESSION: No evidence of acute cardiopulmonary abnormality. Borderline cardiomegaly,  unchanged. Aortic Atherosclerosis (ICD10-I70.0). Electronically Signed   By: KKellie SimmeringD.O.   On: 10/11/2022 11:54    PROCEDURES:  Critical Care performed: N/A.  Procedures    MEDICATIONS ORDERED IN ED: Medications  iohexol (OMNIPAQUE) 350 MG/ML injection 75 mL (75 mLs Intravenous Contrast Given 10/11/22 1257)  sodium chloride 0.9 % bolus 1,000 mL (1,000 mLs Intravenous New Bag/Given 10/11/22 1408)  oseltamivir (TAMIFLU) capsule 75 mg (75 mg Oral Given 10/11/22 1415)     IMPRESSION / MDM / ASSESSMENT AND PLAN / ED COURSE  I reviewed the triage vital signs and the nursing notes.                              Differential diagnosis includes, but is not limited to, COVID-19, influenza, community-acquired pneumonia, RSV, pneumonitis, heart failure, pleural effusions, pulmonary edema.  ED Course Patient appears stable, but uncomfortable.  Vitals within normal limits at this time.  SpO2 is 97% on 2 L nasal cannula.  She is not usually on oxygen at home.  CBC shows no leukocytosis or anemia.  CMP shows no transaminitis, electrolyte abnormalities, or AKI.  Respiratory panel positive for influenza.  Assessment/Plan Patient presents for evaluation of shortness of breath, cough, and congestion x 7 days.  Currently stable at this time, though moderate rhonchi and wheezing was noted on auscultation bilaterally.  SpO2 is 97% on 2 L nasal cannula, however on room  air, she is at 89%.  Respiratory panel positive for influenza.  I suspect this is likely the source of her symptoms.  CT angiogram fortunately does not show any evidence of pulmonary embolism, but does show multiple complex opacities consistent with viral pneumonia.  Will initiate IV fluids and oseltamavir.  Will plan to admit for supplemental oxygenation and observation.  Spoke to the hospitalist, Dr. Charleen Kirks, who agreed to admission.  Patient's presentation is most consistent with acute complicated illness / injury requiring  diagnostic workup.       FINAL CLINICAL IMPRESSION(S) / ED DIAGNOSES   Final diagnoses:  Influenza     Rx / DC Orders   ED Discharge Orders     None        Note:  This document was prepared using Dragon voice recognition software and may include unintentional dictation errors.   Teodoro Spray, [place] 10/11/22 1523    Nance Pear, MD 10/11/22 [phone]

## 2022-10-11 NOTE — Progress Notes (Signed)
Virtual telephone visit    Virtual Visit via Telephone Note   This visit type was conducted due to national recommendations for restrictions regarding the COVID-19 Pandemic (e.g. social distancing) in an effort to limit this patient's exposure and mitigate transmission in our community. Due to her co-morbid illnesses, this patient is at least at moderate risk for complications without adequate follow up. This format is felt to be most appropriate for this patient at this time. The patient did not have access to video technology or had technical difficulties with video requiring transitioning to audio format only (telephone). Physical exam was limited to content and character of the telephone converstion. CMA was able to get the patient set up on a telephone visit.   Patient location: Home. Patient and provider in visit Provider location: Office  I discussed the limitations of evaluation and management by telemedicine and the availability of in person appointments. The patient expressed understanding and agreed to proceed.   Visit Date: 10/11/2022  Today's healthcare provider: Tomasita Morrow, NP     Subjective:    Patient ID: Kerri Mills, female    DOB: Jun 05, 1931, 89 y.o.   MRN: [id number]  Chief Complaint  Patient presents with   Sore Throat    Cough and congestion since Saturday    HPI  Patient reports symptoms starting Saturday evening. She reports a runny nose that began on Friday. She reports someone coming into her home yesterday to check on her and her oxygen saturation being in the 80s when she was talking/walking. She has an inhaler that she has not been using. She reports extreme fatigue and not feeling well. She states she tested negative for COVID at home on Sunday.   Respiratory illness:  Cough- Yes, productive  Congestion-    Sinus- Yes   Chest- Yes  Post nasal drip- No  Sore throat- Yes, irritation from cough  Shortness of breath- Yes  Fever-  No  Fatigue/Myalgia- Yes Headache- Yes Nausea/Vomiting- No Taste disturbance- No  Smell disturbance- Yes  Covid vaccination- x 5  Flu vaccination- Yes   Past Medical History:  Diagnosis Date   Allergy    Anxiety    Arthritis    Atypical chest pain    a. 10/2018 MV: small, fixed apical defect possibly 2/2 attenuation artifact. No ischemia.  EF 68%.   Clotting disorder ([place])    Colitis    Depression    Diverticulitis 2013   Dysrhythmia    Gastric ulcer    GERD (gastroesophageal reflux disease)    History of echocardiogram    a. 09/2019 Echo: EF 55-60%, mod LVH. Mildly dil LA. Triv MR/TR.   Hypercholesterolemia    Hypertension    Hypothyroidism    Infiltrating lobular carcinoma of left breast 2011   T2,N0, ER: 90%; PR 0%; Her 2 neu not amplified. [facility]).   Melanoma ([place]) 1997   Melanoma in situ of upper extremity ([place]) 03/19/2011   Persistent atrial fibrillation ([place])    a. CHADS2VASc => 4 (HTN, age x 2, female)   Personal history of radiation therapy 2011   BREAST CA   Presence of permanent cardiac pacemaker    Seroma    HISTORY OF LFT BREAST   Sleep apnea    Thyroid cancer ([place]) 1992    Past Surgical History:  Procedure Laterality Date   ABDOMINAL HYSTERECTOMY  1973   partial   BREAST BIOPSY Left 02-13-13   BENIGN BREAST TISSUE WITH CHANGES CONSISTENT WITH  FAT NECROSIS   BREAST BIOPSY Left 01/21/2015   bx done in [place] office 11:00 left 6-8cmfn   BREAST EXCISIONAL BIOPSY Left 1995   neg   BREAST EXCISIONAL BIOPSY Left 2011   Breast cancer radiation   BREAST LUMPECTOMY Left 2011   BREAST CA   CARDIAC CATHETERIZATION     CHOLECYSTECTOMY     COLONOSCOPY  2013   COLONOSCOPY WITH PROPOFOL N/A 06/09/2021   Procedure: COLONOSCOPY WITH PROPOFOL;  Surgeon: Jonathon Bellows, MD;  [facility] ENDOSCOPY;  Service: Gastroenterology;  Laterality: N/A;   HAMMER TOE SURGERY Bilateral 02/24/2022   Procedure: HAMMER TOE CORRECTION 2, 3, 4 Right and 2nd Left;   Surgeon: Edrick Kins, DPM;  Location: [facility] ORS;  Service: Podiatry;  Laterality: Bilateral;   MELANOMA EXCISION     RT UPPER ARM   PACEMAKER IMPLANT N/A 01/10/2020   Procedure: PACEMAKER IMPLANT;  Surgeon: Constance Haw, MD;  Location: [facility];  Service: Cardiovascular;  Laterality: N/A;   PARTIAL HYSTERECTOMY     bleeding, ovaries in place.     THYROID SURGERY  1992   FOR THYROID CANCER   TONSILLECTOMY      Family History  Problem Relation Age of Onset   Heart disease Mother    Cancer Brother        lung    Cancer Sister        breast   Breast cancer Neg Hx     Social History   Socioeconomic History   Marital status: Widowed    Spouse name: Not on file   Number of children: Not on file   Years of education: Not on file   Highest education level: Not on file  Occupational History   Not on file  Tobacco Use   Smoking status: Never   Smokeless tobacco: Never   Tobacco comments:    never  Vaping Use   Vaping Use: Never used  Substance and Sexual Activity   Alcohol use: Yes    Comment: once in a while   Drug use: No   Sexual activity: Never  Other Topics Concern   Not on file  Social History Narrative   Independent and baseline. Lives by herself   Social Determinants of Health   Financial Resource Strain: Low Risk  (02/08/2022)   Overall Financial Resource Strain (CARDIA)    Difficulty of Paying Living Expenses: Not hard at all  Food Insecurity: No Food Insecurity (02/08/2022)   Hunger Vital Sign    Worried About Running Out of Food in the Last Year: Never true    [place] in the Last Year: Never true  Transportation Needs: No Transportation Needs (02/08/2022)   PRAPARE - Hydrologist (Medical): No    Lack of Transportation (Non-Medical): No  Physical Activity: Sufficiently Active (02/08/2022)   Exercise Vital Sign    Days of Exercise per Week: 4 days    Minutes of Exercise per Session: 40 min  Stress: No  Stress Concern Present (02/08/2022)   [place]    Feeling of Stress : Only a little  Social Connections: Unknown (02/08/2022)   Social Connection and Isolation Panel [NHANES]    Frequency of Communication with Friends and Family: More than three times a week    Frequency of Social Gatherings with Friends and Family: More than three times a week    Attends Religious Services: Not on file  Active Member of Clubs or Organizations: Not on file    Attends Club or Organization Meetings: Not on file    Marital Status: Widowed  Intimate Partner Violence: Not At Risk (02/08/2022)   Humiliation, Afraid, Rape, and Kick questionnaire    Fear of Current or Ex-Partner: No    Emotionally Abused: No    Physically Abused: No    Sexually Abused: No    Outpatient Medications Prior to Visit  Medication Sig Dispense Refill   acetaminophen (TYLENOL) 650 MG CR tablet Take 650 mg by mouth every 8 (eight) hours as needed for pain.     albuterol (VENTOLIN HFA) 108 (90 Base) MCG/ACT inhaler Inhale 2 puffs into the lungs every 6 (six) hours as needed for wheezing or shortness of breath.     amiodarone (PACERONE) 200 MG tablet TAKE 1 TABLET BY MOUTH DAILY 30 tablet 2   azelastine (ASTELIN) 0.1 % nasal spray Place 1 spray into both nostrils 2 (two) times daily. Use in each nostril as directed 30 mL 0   cholecalciferol (VITAMIN D3) 25 MCG (1000 UNIT) tablet Take 2,000 Units by mouth daily.     DULoxetine (CYMBALTA) 60 MG capsule TAKE ONE CAPSULE AT BEDTIME 90 capsule 1   ELIQUIS 5 MG TABS tablet TAKE ONE TABLET BY MOUTH TWICE DAILY 180 tablet 1   gabapentin (NEURONTIN) 300 MG capsule TAKE 2 CAPSULES BY MOUTH AT BEDTIME 180 capsule 1   levothyroxine (SYNTHROID) 100 MCG tablet TAKE 1 TABLET EVERY DAY ON EMPTY STOMACHWITH A GLASS OF WATER AT LEAST 30-60 MINBEFORE BREAKFAST 90 tablet 1   lovastatin (MEVACOR) 40 MG tablet TAKE 1 TABLET BY MOUTH DAILY 90  tablet 3   metoprolol tartrate (LOPRESSOR) 50 MG tablet TAKE 1.5 TABLETS BY MOUTH TWICE DAILY. (Patient taking differently: Take 75 mg by mouth 2 (two) times daily.) 270 tablet 0   Multiple Vitamins-Minerals (PRESERVISION AREDS 2+MULTI VIT PO) Take 1 tablet by mouth 2 (two) times daily.     omeprazole (PRILOSEC) 20 MG capsule TAKE 1 CAPSULE BY MOUTH ONCE DAILY 90 capsule 3   Polyethyl Glycol-Propyl Glycol (SYSTANE OP) Place 1 drop into both eyes daily as needed (for dry eyes).     polyethylene glycol powder (GLYCOLAX/MIRALAX) 17 GM/SCOOP powder Take 17 g by mouth daily. 850 g 1   Probiotic Product (ALIGN) 4 MG CAPS One capsule daily while on antibiotic and for two weeks after complete antibiotic 30 capsule 0   triamcinolone cream (KENALOG) 0.1 % Apply 1 application. topically 2 (two) times daily. 30 g 0   vitamin B-12 (CYANOCOBALAMIN) 1000 MCG tablet Take 1,000 mcg by mouth daily.     White Petrolatum-Mineral Oil (GENTEAL TEARS NIGHT-TIME OP) Place 1 application into both eyes at bedtime. Night time ointment 3.5g     furosemide (LASIX) 20 MG tablet TAKE 1 TABLET BY MOUTH DAILY AS NEEDED. TAKE WITH POTASSIUM. (Patient not taking: Reported on 10/11/2022) 90 tablet 3   potassium chloride (KLOR-CON) 10 MEQ tablet TAKE 2 TABLETS BY MOUTH DAILY AS NEEDED.WITH FUROSEMIDE. (Patient not taking: Reported on 10/11/2022) 60 tablet 1   No facility-administered medications prior to visit.    Allergies  Allergen Reactions   Lipitor [Atorvastatin Calcium] Other (See Comments)    Stiffness & soreness   Penicillins Rash    ROS See HPI    Objective:    Physical Exam  Ht '5\' 5"'$  (1.651 m)   Wt 168 lb (76.2 kg)   BMI 27.96 kg/m  Wt Readings from Last  3 Encounters:  10/11/22 168 lb (76.2 kg)  08/30/22 168 lb 12.8 oz (76.6 kg)  08/09/22 171 lb 6 oz (77.7 kg)       Assessment & Plan:   Problem List Items Addressed This Visit       Other   Cough - Primary    Advised patient to go to Emergency  Department due to low oxygen saturations and need for further evaluation. Patient voiced understanding and stated she would seek care there.        I am having Kerri Laine. Mills maintain her Polyethyl Glycol-Propyl Glycol (SYSTANE OP), White Petrolatum-Mineral Oil (GENTEAL TEARS NIGHT-TIME OP), polyethylene glycol powder, cholecalciferol, acetaminophen, potassium chloride, Multiple Vitamins-Minerals (PRESERVISION AREDS 2+MULTI VIT PO), furosemide, azelastine, Align, cyanocobalamin, albuterol, triamcinolone cream, lovastatin, metoprolol tartrate, gabapentin, DULoxetine, omeprazole, levothyroxine, amiodarone, and Eliquis.  No orders of the defined types were placed in this encounter.    I discussed the assessment and treatment plan with the patient. The patient was provided an opportunity to ask questions and all were answered. The patient agreed with the plan and demonstrated an understanding of the instructions.   The patient was advised to call back or seek an in-person evaluation if the symptoms worsen or if the condition fails to improve as anticipated.   Tomasita Morrow, NP [facility] [phone] (phone) [phone] (fax)  [place]

## 2022-10-11 NOTE — Progress Notes (Signed)
PHARMACY NOTE:  ANTIMICROBIAL RENAL DOSAGE ADJUSTMENT  Current antimicrobial regimen includes a mismatch between antimicrobial dosage and estimated renal function.  As per policy approved by the Pharmacy & Therapeutics and Medical Executive Committees, the antimicrobial dosage will be adjusted accordingly.  Current antimicrobial dosage:  levaquin 750 mg IV Q24H   Indication: CAP  Renal Function:  Estimated Creatinine Clearance: 43.1 mL/min (by C-G formula based on SCr of 0.91 mg/dL). '[]'$      On intermittent HD, scheduled: '[]'$      On CRRT    Antimicrobial dosage has been changed to:  Levaquin 750 mg IV Q48H  Additional comments:   Thank you for allowing pharmacy to be a part of this patient's care.  Stephanieann Popescu D, [facility] 10/11/2022 9:45 PM

## 2022-10-11 NOTE — Assessment & Plan Note (Addendum)
Kerri Mills is presenting with 1 week history of decreased appetite, rhinorrhea, productive cough and hypoxia with influenza A PCR positive and evidence of multifocal pneumonia on CTA.  Procalcitonin is negative which would support an viral only pneumonia, however given her indolent course of worsening shortness of breath in the setting of chronic bronchiectasis, I am concerned there may be a possibility of an underlying indolent infection.  At this point, Tamiflu would be unlikely to provide any benefit and would come with more risk; will discontinue at this time.  - Continue supplemental oxygen to maintain oxygen saturation above 88% - Wean as tolerated - Ambulatory oxygen saturation measurements - Strep pneumo and Legionella urinary antigens pending - Expectorated sputum culture - Hypertonic saline nebulizer treatments - Consider initiation of levofloxacin for bronchiectasis exacerbation

## 2022-10-11 NOTE — Assessment & Plan Note (Addendum)
Advised patient to go to Emergency Department due to low oxygen saturations and need for further evaluation. Patient voiced understanding and stated she would seek care there.

## 2022-10-11 NOTE — H&P (Signed)
History and Physical    Patient: Kerri Mills:[id number] DOB: [birth year] DOA: 10/11/2022 DOS: the patient was seen and examined on 10/11/2022 PCP: Einar Pheasant, MD  Patient coming from: Home  Chief Complaint:  Chief Complaint  Patient presents with   Cough   HPI: Kerri Mills is a 89 y.o. female with medical history significant of tachy-brady syndrome s/p PPM, persistent atrial fibrillation on AC, bronchiectasis, hypertension, hyperlipidemia, breast cancer, thyroid cancer, OSA on CPAP, who presents to the ED with complaints of cough.  Kerri Mills states that approximately 1 week ago, she developed decreased appetite.  Over the next few days, she developed rhinorrhea and then subsequently a productive cough that became significantly severe on 1/13 and 1/14.  Her home health nurse came to check on her and noted that her oxygen levels were in the low 80s.  Due to this, EMS was called.  Kerri Mills denies any fever at this time, chills, nausea, vomiting.  She endorses shortness of breath both at rest and with exertion but states this has been gradually worsening over the last several months and she has been scared to address this with her doctor as she would not want to be placed on continuous oxygen.  She states that over the last couple weeks, her shortness of breath has significantly worsened.  ED course: On arrival to the ED, patient was afebrile at 98.8 with blood pressure 139/80 and heart rate of 80.  She was saturating initially at 91% on room air but desaturated to 89% at rest.  She required 2 L of supplemental oxygen and oxygen saturation improved to 97%. Initial workup remarkable for normal CBC, and CMP with glucose of 146, albumin of 3.3.  Influenza A PCR positive.  CTA was obtained with no evidence of PE, however patchy groundglass opacities throughout both lungs.  Due to influenza pneumonia, [facility] contacted for admission.  Review of Systems: As mentioned in the history of present  illness. All other systems reviewed and are negative. Past Medical History:  Diagnosis Date   Allergy    Anxiety    Arthritis    Atypical chest pain    a. 10/2018 MV: small, fixed apical defect possibly 2/2 attenuation artifact. No ischemia.  EF 68%.   Clotting disorder ([place])    Colitis    Depression    Diverticulitis 2013   Dysrhythmia    Gastric ulcer    GERD (gastroesophageal reflux disease)    History of echocardiogram    a. 09/2019 Echo: EF 55-60%, mod LVH. Mildly dil LA. Triv MR/TR.   Hypercholesterolemia    Hypertension    Hypothyroidism    Infiltrating lobular carcinoma of left breast 2011   T2,N0, ER: 90%; PR 0%; Her 2 neu not amplified. [facility]).   Melanoma ([place]) 1997   Melanoma in situ of upper extremity ([place]) 03/19/2011   Persistent atrial fibrillation ([place])    a. CHADS2VASc => 4 (HTN, age x 2, female)   Personal history of radiation therapy 2011   BREAST CA   Presence of permanent cardiac pacemaker    Seroma    HISTORY OF LFT BREAST   Sleep apnea    Thyroid cancer ([place]) 1992   Past Surgical History:  Procedure Laterality Date   ABDOMINAL HYSTERECTOMY  1973   partial   BREAST BIOPSY Left 02-13-13   BENIGN BREAST TISSUE WITH CHANGES CONSISTENT WITH FAT NECROSIS   BREAST BIOPSY Left 01/21/2015   bx done in [place] office 11:00  left 6-8cmfn   BREAST EXCISIONAL BIOPSY Left 1995   neg   BREAST EXCISIONAL BIOPSY Left 2011   Breast cancer radiation   BREAST LUMPECTOMY Left 2011   BREAST CA   CARDIAC CATHETERIZATION     CHOLECYSTECTOMY     COLONOSCOPY  2013   COLONOSCOPY WITH PROPOFOL N/A 06/09/2021   Procedure: COLONOSCOPY WITH PROPOFOL;  Surgeon: Jonathon Bellows, MD;  Location: [facility] ENDOSCOPY;  Service: Gastroenterology;  Laterality: N/A;   HAMMER TOE SURGERY Bilateral 02/24/2022   Procedure: HAMMER TOE CORRECTION 2, 3, 4 Right and 2nd Left;  Surgeon: Edrick Kins, DPM;  Location: [facility] ORS;  Service: Podiatry;  Laterality: Bilateral;   MELANOMA EXCISION      RT UPPER ARM   PACEMAKER IMPLANT N/A 01/10/2020   Procedure: PACEMAKER IMPLANT;  Surgeon: Constance Haw, MD;  Location: [place] [facility] LAB;  Service: Cardiovascular;  Laterality: N/A;   PARTIAL HYSTERECTOMY     bleeding, ovaries in place.     THYROID SURGERY  1992   FOR THYROID CANCER   TONSILLECTOMY     Social History:  reports that she has never smoked. She has never used smokeless tobacco. She reports current alcohol use. She reports that she does not use drugs.  Allergies  Allergen Reactions   Lipitor [Atorvastatin Calcium] Other (See Comments)    Stiffness & soreness   Penicillins Rash    Family History  Problem Relation Age of Onset   Heart disease Mother    Cancer Brother        lung    Cancer Sister        breast   Breast cancer Neg Hx     Prior to Admission medications   Medication Sig Start Date End Date Taking? Authorizing Provider  acetaminophen (TYLENOL) 650 MG CR tablet Take 650 mg by mouth every 8 (eight) hours as needed for pain.    [provider]  albuterol (VENTOLIN HFA) 108 (90 Base) MCG/ACT inhaler Inhale 2 puffs into the lungs every 6 (six) hours as needed for wheezing or shortness of breath.    [provider]  amiodarone (PACERONE) 200 MG tablet TAKE 1 TABLET BY MOUTH DAILY 10/03/22   Camnitz, Ocie Doyne, MD  azelastine (ASTELIN) 0.1 % nasal spray Place 1 spray into both nostrils 2 (two) times daily. Use in each nostril as directed 09/21/21   Einar Pheasant, MD  cholecalciferol (VITAMIN D3) 25 MCG (1000 UNIT) tablet Take 2,000 Units by mouth daily.    [provider]  DULoxetine (CYMBALTA) 60 MG capsule TAKE ONE CAPSULE AT BEDTIME 08/15/22   Einar Pheasant, MD  ELIQUIS 5 MG TABS tablet TAKE ONE TABLET BY MOUTH TWICE DAILY 10/10/22   Minna Merritts, MD  furosemide (LASIX) 20 MG tablet TAKE 1 TABLET BY MOUTH DAILY AS NEEDED. TAKE WITH POTASSIUM. Patient not taking: Reported on 10/11/2022 08/02/21   Minna Merritts, MD   gabapentin (NEURONTIN) 300 MG capsule TAKE 2 CAPSULES BY MOUTH AT BEDTIME 06/15/22   Einar Pheasant, MD  levothyroxine (SYNTHROID) 100 MCG tablet TAKE 1 TABLET EVERY DAY ON EMPTY STOMACHWITH A GLASS OF WATER AT LEAST 30-60 MINBEFORE BREAKFAST 09/06/22   Einar Pheasant, MD  lovastatin (MEVACOR) 40 MG tablet TAKE 1 TABLET BY MOUTH DAILY 03/11/22   Crecencio Mc, MD  metoprolol tartrate (LOPRESSOR) 50 MG tablet TAKE 1.5 TABLETS BY MOUTH TWICE DAILY. Patient taking differently: Take 75 mg by mouth 2 (two) times daily. 04/19/22   Minna Merritts,  MD  Multiple Vitamins-Minerals (PRESERVISION AREDS 2+MULTI VIT PO) Take 1 tablet by mouth 2 (two) times daily.    [provider]  omeprazole (PRILOSEC) 20 MG capsule TAKE 1 CAPSULE BY MOUTH ONCE DAILY 08/17/22   Einar Pheasant, MD  Polyethyl Glycol-Propyl Glycol (SYSTANE OP) Place 1 drop into both eyes daily as needed (for dry eyes).    [provider]  polyethylene glycol powder (GLYCOLAX/MIRALAX) 17 GM/SCOOP powder Take 17 g by mouth daily. 09/10/20   Einar Pheasant, MD  potassium chloride (KLOR-CON) 10 MEQ tablet TAKE 2 TABLETS BY MOUTH DAILY AS NEEDED.WITH FUROSEMIDE. Patient not taking: Reported on 10/11/2022 04/26/21   Minna Merritts, MD  Probiotic Product (ALIGN) 4 MG CAPS One capsule daily while on antibiotic and for two weeks after complete antibiotic 01/12/22   Einar Pheasant, MD  triamcinolone cream (KENALOG) 0.1 % Apply 1 application. topically 2 (two) times daily. 02/16/22   Einar Pheasant, MD  vitamin B-12 (CYANOCOBALAMIN) 1000 MCG tablet Take 1,000 mcg by mouth daily.    [provider]  White Petrolatum-Mineral Oil (GENTEAL TEARS NIGHT-TIME OP) Place 1 application into both eyes at bedtime. Night time ointment 3.5g    [provider]    Physical Exam: Vitals:   10/11/22 1222 10/11/22 1252 10/11/22 1329 10/11/22 1430  BP: 132/79 (!) 129/92 120/88 (!) 123/91  Pulse: 80 80 88 82  Resp: '20 20 20    '$ Temp:  98.1 F (36.7 C)    TempSrc:  Oral    SpO2: 98% 97% (!) 89% 100%  Weight:      Height:       Physical Exam Vitals and nursing note reviewed.  Constitutional:      General: She is not in acute distress.    Appearance: She is normal weight. She is not toxic-appearing.  HENT:     Head: Normocephalic and atraumatic.     Mouth/Throat:     Mouth: Mucous membranes are moist.     Pharynx: Oropharynx is clear.  Eyes:     Conjunctiva/sclera: Conjunctivae normal.     Pupils: Pupils are equal, round, and reactive to light.  Cardiovascular:     Rate and Rhythm: Normal rate and regular rhythm.     Heart sounds: No murmur heard.    No gallop.  Pulmonary:     Effort: No accessory muscle usage or respiratory distress.     Breath sounds: Rhonchi (Diffuse predominantly on expiration  Doubt) present. No decreased breath sounds, wheezing or rales.  Musculoskeletal:     Cervical back: Neck supple.     Right lower leg: No edema.     Left lower leg: No edema.  Skin:    General: Skin is warm and dry.  Neurological:     General: No focal deficit present.     Mental Status: She is alert and oriented to person, place, and time. Mental status is at baseline.     Comments: Chronic tremor noted  Psychiatric:        Mood and Affect: Mood normal.        Behavior: Behavior normal.    Data Reviewed: CBC with WBC of 5.2, hemoglobin of 12.6, MCV 96, platelets of 154 CMP with sodium 135, potassium 3.9, bicarb 23, glucose 146, BUN 14, creatinine 0.9, anion gap 8, albumin 3.3, AST 30, ALT 22, and GFR over 60 Influenza PCR positive  EKG personally reviewed.  Dual AV pacing noted with a rate of 81.  CT Angio Chest PE  W and/or Wo Contrast  Result Date: 10/11/2022 CLINICAL DATA:  Pulmonary embolism suspected, high probability. Flu-like symptoms shortness of breath. EXAM: CT ANGIOGRAPHY CHEST WITH CONTRAST TECHNIQUE: Multidetector CT imaging of the chest was performed using the standard protocol  during bolus administration of intravenous contrast. Multiplanar CT image reconstructions and MIPs were obtained to evaluate the vascular anatomy. RADIATION DOSE REDUCTION: This exam was performed according to the departmental dose-optimization program which includes automated exposure control, adjustment of the mA and/or kV according to patient size and/or use of iterative reconstruction technique. CONTRAST:  109m OMNIPAQUE IOHEXOL 350 MG/ML SOLN COMPARISON:  Chest CT dated 05/05/2022. FINDINGS: Cardiovascular: Some of the most peripheral segmental and subsegmental pulmonary artery branches are difficult to definitively characterize due to patient breathing motion artifact, however, there is no pulmonary embolism identified within the main, lobar or segmental pulmonary arteries bilaterally. No thoracic aortic aneurysm. Scattered aortic atherosclerosis. No pericardial effusion. Scattered coronary artery calcifications. Mediastinum/Nodes: No mass or enlarged lymph nodes are seen within the mediastinum or perihilar regions. Esophagus is unremarkable. Small hiatal hernia within the lower mediastinum. Trachea and central bronchi are unremarkable. Lungs/Pleura: Patchy ground-glass opacities throughout both lungs. No pleural effusion or pneumothorax. Upper Abdomen: Limited images of the upper abdomen are unremarkable. Musculoskeletal: Degenerative spondylosis of the slightly kyphotic thoracic spine. No acute-appearing osseous abnormality. Review of the MIP images confirms the above findings. IMPRESSION: 1. No pulmonary embolism identified, with mild study limitations detailed above. 2. Patchy ground-glass opacities throughout both lungs. Differential includes atypical/viral pneumonias, interstitial pneumonias, edema related to volume overload/CHF, hypersensitivity pneumonitis, and respiratory bronchiolitis. COVID pneumonia can have this appearance. 3. Small hiatal hernia. 4. Coronary artery calcifications. Electronically  Signed   By: SFranki CabotM.D.   On: 10/11/2022 13:13   DG Chest 2 View  Result Date: 10/11/2022 CLINICAL DATA:  Provided history: Shortness of breath. Additional history provided: Flu like symptoms. EXAM: CHEST - 2 VIEW COMPARISON:  Prior chest radiographs 08/30/2022 and earlier. FINDINGS: Left chest multilead implantable cardiac device. Borderline cardiomegaly, unchanged. Aortic atherosclerosis. No appreciable airspace consolidation or pulmonary edema. No evidence of pleural effusion or pneumothorax. Thoracic spondylosis. IMPRESSION: No evidence of acute cardiopulmonary abnormality. Borderline cardiomegaly, unchanged. Aortic Atherosclerosis (ICD10-I70.0). Electronically Signed   By: KKellie SimmeringD.O.   On: 10/11/2022 11:54    There are no new results to review at this time.  Assessment and Plan:  * Acute hypoxic respiratory failure ([facility] Mrs. HLundquistis presenting with 1 week history of decreased appetite, rhinorrhea, productive cough and hypoxia with influenza A PCR positive and evidence of multifocal pneumonia on CTA.  Procalcitonin is negative which would support an viral only pneumonia, however given her indolent course of worsening shortness of breath in the setting of chronic bronchiectasis, I am concerned there may be a possibility of an underlying indolent infection.  At this point, Tamiflu would be unlikely to provide any benefit and would come with more risk; will discontinue at this time.  - Continue supplemental oxygen to maintain oxygen saturation above 88% - Wean as tolerated - Ambulatory oxygen saturation measurements - Strep pneumo and Legionella urinary antigens pending - Expectorated sputum culture - Hypertonic saline nebulizer treatments - Consider initiation of levofloxacin for bronchiectasis exacerbation  Essential hypertension - Continue home antihypertensives  Atrial fibrillation (HCC) Rate controlled at this time with AV pacing noted.  -Continue home metoprolol,  amiodarone and Eliquis  Obstructive sleep apnea - Continue home CPAP  Advance Care Planning:   Code Status: DNR/DNI.  Patient  states that after witnessing her husband be shocked and watching her father be on a ventilator unwillingly, she would not want to be placed in similar situations.  She initially felt that she would be okay with CPR but not shock. After further discussion and understanding of what CPR entails, patient states she would not want to receive that.  Her daughter states that in the past, her her mother stated that she would not want to be resuscitated.  Consults: None  Family Communication: [name] updated at bedside  Severity of Illness: The appropriate patient status for this patient is OBSERVATION. Observation status is judged to be reasonable and necessary in order to provide the required intensity of service to ensure the patient's safety. The patient's presenting symptoms, physical exam findings, and initial radiographic and laboratory data in the context of their medical condition is felt to place them at decreased risk for further clinical deterioration. Furthermore, it is anticipated that the patient will be medically stable for discharge from the hospital within 2 midnights of admission.   Author: Jose Persia, MD 10/11/2022 3:48 PM  For on call review [email or web address].

## 2022-10-11 NOTE — Assessment & Plan Note (Signed)
-  Continue home CPAP

## 2022-10-11 NOTE — Assessment & Plan Note (Signed)
-  Continue home antihypertensives 

## 2022-10-11 NOTE — Progress Notes (Signed)
Patient was unable to do virtual visit. She was offered in office appointment

## 2022-10-11 NOTE — ED Triage Notes (Signed)
Pt presents to the ED via POV due to flu-like symptoms and SOB. Pt was told to come to ED by PCP due to home reading of low oxygen. Pt is 91 in triage but placed on 2L O2 for comfort. Pt is A&OX4

## 2022-10-12 DIAGNOSIS — Z85850 Personal history of malignant neoplasm of thyroid: Secondary | ICD-10-CM | POA: Diagnosis not present

## 2022-10-12 DIAGNOSIS — J09X1 Influenza due to identified novel influenza A virus with pneumonia: Secondary | ICD-10-CM

## 2022-10-12 DIAGNOSIS — Z79899 Other long term (current) drug therapy: Secondary | ICD-10-CM | POA: Diagnosis not present

## 2022-10-12 DIAGNOSIS — R059 Cough, unspecified: Secondary | ICD-10-CM | POA: Diagnosis present

## 2022-10-12 DIAGNOSIS — Z85820 Personal history of malignant melanoma of skin: Secondary | ICD-10-CM | POA: Diagnosis not present

## 2022-10-12 DIAGNOSIS — G4733 Obstructive sleep apnea (adult) (pediatric): Secondary | ICD-10-CM | POA: Diagnosis present

## 2022-10-12 DIAGNOSIS — Z950 Presence of cardiac pacemaker: Secondary | ICD-10-CM | POA: Diagnosis not present

## 2022-10-12 DIAGNOSIS — Z923 Personal history of irradiation: Secondary | ICD-10-CM | POA: Diagnosis not present

## 2022-10-12 DIAGNOSIS — Z79890 Hormone replacement therapy (postmenopausal): Secondary | ICD-10-CM | POA: Diagnosis not present

## 2022-10-12 DIAGNOSIS — K219 Gastro-esophageal reflux disease without esophagitis: Secondary | ICD-10-CM | POA: Diagnosis present

## 2022-10-12 DIAGNOSIS — I495 Sick sinus syndrome: Secondary | ICD-10-CM | POA: Diagnosis present

## 2022-10-12 DIAGNOSIS — J9601 Acute respiratory failure with hypoxia: Secondary | ICD-10-CM | POA: Diagnosis present

## 2022-10-12 DIAGNOSIS — E7800 Pure hypercholesterolemia, unspecified: Secondary | ICD-10-CM | POA: Diagnosis present

## 2022-10-12 DIAGNOSIS — J471 Bronchiectasis with (acute) exacerbation: Secondary | ICD-10-CM | POA: Diagnosis present

## 2022-10-12 DIAGNOSIS — Z1152 Encounter for screening for COVID-19: Secondary | ICD-10-CM | POA: Diagnosis not present

## 2022-10-12 DIAGNOSIS — Z853 Personal history of malignant neoplasm of breast: Secondary | ICD-10-CM | POA: Diagnosis not present

## 2022-10-12 DIAGNOSIS — Z8249 Family history of ischemic heart disease and other diseases of the circulatory system: Secondary | ICD-10-CM | POA: Diagnosis not present

## 2022-10-12 DIAGNOSIS — J1000 Influenza due to other identified influenza virus with unspecified type of pneumonia: Secondary | ICD-10-CM | POA: Diagnosis present

## 2022-10-12 DIAGNOSIS — I4819 Other persistent atrial fibrillation: Secondary | ICD-10-CM | POA: Diagnosis present

## 2022-10-12 DIAGNOSIS — E039 Hypothyroidism, unspecified: Secondary | ICD-10-CM | POA: Diagnosis present

## 2022-10-12 DIAGNOSIS — I10 Essential (primary) hypertension: Secondary | ICD-10-CM | POA: Diagnosis present

## 2022-10-12 DIAGNOSIS — Z66 Do not resuscitate: Secondary | ICD-10-CM | POA: Diagnosis present

## 2022-10-12 DIAGNOSIS — Z90710 Acquired absence of both cervix and uterus: Secondary | ICD-10-CM | POA: Diagnosis not present

## 2022-10-12 DIAGNOSIS — Z801 Family history of malignant neoplasm of trachea, bronchus and lung: Secondary | ICD-10-CM | POA: Diagnosis not present

## 2022-10-12 LAB — BASIC METABOLIC PANEL
Anion gap: 6 (ref 5–15)
BUN: 10 mg/dL (ref 8–23)
CO2: 27 mmol/L (ref 22–32)
Calcium: 8.8 mg/dL — ABNORMAL LOW (ref 8.9–10.3)
Chloride: 102 mmol/L (ref 98–111)
Creatinine, Ser: 0.73 mg/dL (ref 0.44–1.00)
GFR, Estimated: >60 mL/min (ref >60)
Glucose, Bld: 99 mg/dL (ref 70–99)
Potassium: 3.9 mmol/L (ref 3.5–5.1)
Sodium: 135 mmol/L (ref 135–145)

## 2022-10-12 LAB — CBC WITH DIFFERENTIAL/PLATELET
Abs Immature Granulocytes: 0.03 10*3/uL (ref 0.00–0.07)
Basophils Absolute: 0 10*3/uL (ref 0.0–0.1)
Basophils Relative: 1 %
Eosinophils Absolute: 0 10*3/uL (ref 0.0–0.5)
Eosinophils Relative: 1 %
HCT: 38.3 % (ref 36.0–46.0)
Hemoglobin: 11.8 g/dL — ABNORMAL LOW (ref 12.0–15.0)
Immature Granulocytes: 1 %
Lymphocytes Relative: 22 %
Lymphs Abs: 1.2 10*3/uL (ref 0.7–4.0)
MCH: 29.9 pg (ref 26.0–34.0)
MCHC: 30.8 g/dL (ref 30.0–36.0)
MCV: 97.2 fL (ref 80.0–100.0)
Monocytes Absolute: 0.7 10*3/uL (ref 0.1–1.0)
Monocytes Relative: 14 %
Neutro Abs: 3.4 10*3/uL (ref 1.7–7.7)
Neutrophils Relative %: 61 %
Platelets: 153 10*3/uL (ref 150–400)
RBC: 3.94 MIL/uL (ref 3.87–5.11)
RDW: 13.2 % (ref 11.5–15.5)
WBC: 5.4 10*3/uL (ref 4.0–10.5)
nRBC: 0 % (ref 0.0–0.2)

## 2022-10-12 LAB — HIV ANTIBODY (ROUTINE TESTING W REFLEX): HIV Screen 4th Generation wRfx: NONREACTIVE

## 2022-10-12 MED ORDER — AMIODARONE HCL 200 MG PO TABS
200.0000 mg | ORAL_TABLET | Freq: Every day | ORAL | Status: DC
  Administered 2022-10-12 – 2022-10-14 (×3): 200 mg via ORAL
  Filled 2022-10-12 (×3): qty 1

## 2022-10-12 MED ORDER — ARTIFICIAL TEARS OPHTHALMIC OINT
1.0000 | TOPICAL_OINTMENT | Freq: Every day | OPHTHALMIC | Status: DC
  Administered 2022-10-12 – 2022-10-13 (×2): 1 via OPHTHALMIC
  Filled 2022-10-12: qty 1

## 2022-10-12 MED ORDER — PANTOPRAZOLE SODIUM 40 MG PO TBEC
40.0000 mg | DELAYED_RELEASE_TABLET | Freq: Every day | ORAL | Status: DC
  Administered 2022-10-12 – 2022-10-14 (×3): 40 mg via ORAL
  Filled 2022-10-12 (×3): qty 1

## 2022-10-12 MED ORDER — POLYVINYL ALCOHOL 1.4 % OP SOLN
1.0000 [drp] | Freq: Every day | OPHTHALMIC | Status: DC | PRN
  Administered 2022-10-13: 1 [drp] via OPHTHALMIC
  Filled 2022-10-12: qty 15

## 2022-10-12 MED ORDER — METHYLPREDNISOLONE SODIUM SUCC 40 MG IJ SOLR
40.0000 mg | Freq: Two times a day (BID) | INTRAMUSCULAR | Status: DC
  Administered 2022-10-12 (×2): 40 mg via INTRAVENOUS
  Filled 2022-10-12 (×2): qty 1

## 2022-10-12 MED ORDER — ACETAMINOPHEN 325 MG PO TABS
650.0000 mg | ORAL_TABLET | Freq: Four times a day (QID) | ORAL | Status: DC | PRN
  Administered 2022-10-12 – 2022-10-13 (×2): 650 mg via ORAL
  Filled 2022-10-12 (×2): qty 2

## 2022-10-12 MED ORDER — PROSIGHT PO TABS
1.0000 | ORAL_TABLET | Freq: Two times a day (BID) | ORAL | Status: DC
  Administered 2022-10-12 – 2022-10-13 (×3): 1 via ORAL
  Filled 2022-10-12 (×4): qty 1

## 2022-10-12 MED ORDER — ONDANSETRON HCL 4 MG/2ML IJ SOLN: 4.0000 mg | Freq: Four times a day (QID) | INTRAMUSCULAR | Status: DC | PRN

## 2022-10-12 MED ORDER — IPRATROPIUM-ALBUTEROL 0.5-2.5 (3) MG/3ML IN SOLN
3.0000 mL | Freq: Four times a day (QID) | RESPIRATORY_TRACT | Status: DC
  Administered 2022-10-12 – 2022-10-14 (×7): 3 mL via RESPIRATORY_TRACT
  Filled 2022-10-12 (×8): qty 3

## 2022-10-12 MED ORDER — GUAIFENESIN ER 600 MG PO TB12
600.0000 mg | ORAL_TABLET | Freq: Two times a day (BID) | ORAL | Status: DC | PRN
  Administered 2022-10-12: 600 mg via ORAL
  Filled 2022-10-12: qty 1

## 2022-10-12 MED ORDER — PRESERVISION AREDS 2+MULTI VIT PO CAPS: 1.0000 | ORAL_CAPSULE | Freq: Two times a day (BID) | ORAL | Status: DC

## 2022-10-12 NOTE — Evaluation (Signed)
Physical Therapy Evaluation Patient Details Name: Kerri Mills MRN: [id number] DOB: Dec 02, 1930 Today's Date: 10/12/2022  History of Present Illness  Kerri Mills is a 89 y.o. female with medical history significant of tachy-brady syndrome s/p PPM, persistent atrial fibrillation on AC, bronchiectasis, hypertension, hyperlipidemia, breast cancer, thyroid cancer, OSA on CPAP, who presents to the ED with complaints of cough. Patient is flu+   Clinical Impression  Patient received in bed, daughter at bedside. She is agreeable to PT assessment. She is independent with bed mobility, transfers with min guard. She is able to ambulate 140 feet without AD. Min guard. One minor lob or "stumble" when returning to room, able to self correct. She will continue to benefit from skilled PT to improve mobility and return to home.            Recommendations for follow up therapy are one component of a multi-disciplinary discharge planning process, led by the attending physician.  Recommendations may be updated based on patient status, additional functional criteria and insurance authorization.  Follow Up Recommendations No PT follow up      Assistance Recommended at Discharge Intermittent Supervision/Assistance  Patient can return home with the following  A little help with walking and/or transfers;A little help with bathing/dressing/bathroom;Assist for transportation;Help with stairs or ramp for entrance    Equipment Recommendations None recommended by PT  Recommendations for Other Services       Functional Status Assessment Patient has had a recent decline in their functional status and demonstrates the ability to make significant improvements in function in a reasonable and predictable amount of time.     Precautions / Restrictions Precautions Precautions: Fall Precaution Comments: mod fall Restrictions Weight Bearing Restrictions: No      Mobility  Bed Mobility Overal bed mobility:  Independent                  Transfers Overall transfer level: Modified independent Equipment used: None                    Ambulation/Gait Ambulation/Gait assistance: Min guard Gait Distance (Feet): 150 Feet Assistive device: None Gait Pattern/deviations: Step-through pattern, Decreased step length - right, Decreased step length - left Gait velocity: decr     General Gait Details: min guard, minor lob  Stairs            Wheelchair Mobility    Modified Rankin (Stroke Patients Only)       Balance Overall balance assessment: Modified Independent, Mild deficits observed, not formally tested                                           Pertinent Vitals/Pain Pain Assessment Pain Assessment: No/denies pain    Home Living Family/patient expects to be discharged to:: Private residence Living Arrangements: Alone Available Help at Discharge: Family;Available PRN/intermittently Type of Home: Independent living facility Home Access: Level entry       Home Layout: One level Home Equipment: Conservation officer, nature (2 wheels)      Prior Function Prior Level of Function : Independent/Modified Independent;Driving             Mobility Comments: does not use ad at baseline ADLs Comments: independent at baseline     Hand Dominance        Extremity/Trunk Assessment   Upper Extremity Assessment Upper Extremity Assessment: Overall WFL for tasks  assessed    Lower Extremity Assessment Lower Extremity Assessment: Overall WFL for tasks assessed    Cervical / Trunk Assessment Cervical / Trunk Assessment: Normal  Communication   Communication: No difficulties  Cognition Arousal/Alertness: Awake/alert Behavior During Therapy: WFL for tasks assessed/performed Overall Cognitive Status: Within Functional Limits for tasks assessed                                          General Comments      Exercises      Assessment/Plan    PT Assessment Patient needs continued PT services  PT Problem List Decreased strength;Decreased activity tolerance;Decreased mobility;Cardiopulmonary status limiting activity       PT Treatment Interventions Therapeutic exercise;Gait training;Balance training;Stair training;Functional mobility training;Therapeutic activities;Patient/family education    PT Goals (Current goals can be found in the Care Plan section)  Acute Rehab PT Goals Patient Stated Goal: to return home PT Goal Formulation: With patient/family Time For Goal Achievement: 10/19/22 Potential to Achieve Goals: Good    Frequency Min 2X/week     Co-evaluation               AM-PAC PT "6 Clicks" Mobility  Outcome Measure Help needed turning from your back to your side while in a flat bed without using bedrails?: None Help needed moving from lying on your back to sitting on the side of a flat bed without using bedrails?: None Help needed moving to and from a bed to a chair (including a wheelchair)?: A Little Help needed standing up from a chair using your arms (e.g., wheelchair or bedside chair)?: A Little Help needed to walk in hospital room?: A Little Help needed climbing 3-5 steps with a railing? : A Little 6 Click Score: 20    End of Session Equipment Utilized During Treatment: Gait belt;Oxygen Activity Tolerance: Patient tolerated treatment well Patient left: in chair;with call bell/phone within reach;with family/visitor present Nurse Communication: Mobility status PT Visit Diagnosis: Muscle weakness (generalized) (M62.81)    Time: 1610-9604 PT Time Calculation (min) (ACUTE ONLY): 24 min   Charges:   PT Evaluation $PT Eval Moderate Complexity: 1 Mod PT Treatments $Gait Training: 8-22 mins        Henryk Ursin, PT, GCS 10/12/22,1:20 PM

## 2022-10-12 NOTE — Progress Notes (Signed)
PROGRESS NOTE  Kerri Mills QQV:[id number] DOB: 1931-05-26 DOA: 10/11/2022 PCP: Einar Pheasant, MD  Hospital Course/Subjective: Kerri Mills is a 89 y.o. female with medical history significant of tachy-brady syndrome s/p PPM, persistent atrial fibrillation on AC, bronchiectasis, hypertension, hyperlipidemia, breast cancer, thyroid cancer, OSA on CPAP, who presents to the ED with complaints of cough. She initially required oxygen 2L and was found to be Influenza A positive with patchy opacities seen on CTA of the chest.  This AM she has no specific complaints other than feeling achy, remains on 3L [place] with intermittent wet cough.  Assessment/Plan:  Principal Problem:   Acute hypoxic respiratory failure (HCC) Active Problems:   Obstructive sleep apnea   Atrial fibrillation (HCC)   Essential hypertension   Influenza A with pneumonia   Assessment and Plan: * Acute hypoxic respiratory failure ([place]) Mrs. Grissett is presenting with 1 week history of decreased appetite, rhinorrhea, productive cough and hypoxia with influenza A PCR positive and evidence of multifocal pneumonia on CTA.  Procalcitonin is negative which would support a viral only pneumonia, however given her indolent course of worsening shortness of breath in the setting of chronic bronchiectasis, there is concern for indolent bacterial infection as well. - Continue supplemental oxygen to maintain oxygen saturation above 88% - Wean as tolerated, patient is on room air at baseline - Ambulatory oxygen saturation measurements - Strep pneumo negative and Legionella urinary antigen pending - Expectorated sputum culture - Hypertonic saline nebulizer treatments -- given wheezing and rhonchi, will start IV Solumedrol BID and scheduled Duonebs - empiric IV levofloxacin for bronchiectasis exacerbation  Essential hypertension - Continue home antihypertensives  Atrial fibrillation ([place]) Rate controlled at this time with AV pacing  noted. -Continue home metoprolol, amiodarone and Eliquis  Obstructive sleep apnea - Continue home CPAP  DVT Prophylaxis: Eliquis  Code Status: DNR   Family Communication: None present, patient alert and oriented x4.  Disposition Plan: Likely home, PT ordered for evaluation.   Consultants: None   Procedures: None   Antimicrobials: Anti-infectives (From admission, onward)    Start     Dose/Rate Route Frequency Ordered Stop   10/11/22 2200  levofloxacin (LEVAQUIN) IVPB 750 mg        750 mg 100 mL/hr over 90 Minutes Intravenous Every 48 hours 10/11/22 2131     10/11/22 1430  oseltamivir (TAMIFLU) capsule 75 mg        75 mg Oral  Once 10/11/22 1339 10/11/22 1415      Objective: Vitals:   10/11/22 1646 10/11/22 2056 10/12/22 0605 10/12/22 0828  BP:  130/71 (!) 154/88 123/71  Pulse:  80 83 80  Resp:  '20 17 18  '$ Temp:  98.1 F (36.7 C) 97.8 F (36.6 C) 98.3 F (36.8 C)  TempSrc:  Oral Oral   SpO2: 98% 95% 94% 96%  Weight:      Height:       No intake or output data in the 24 hours ending 10/12/22 0846 Filed Weights   10/11/22 1126 10/11/22 1626  Weight: 76.2 kg 77.5 kg   Exam: General:  Alert, oriented, calm, in no acute distress, speaking in full sentences Eyes: EOMI, clear sclerea Neck: supple, no masses, trachea mildline  Cardiovascular: RRR, no murmurs or rubs, no peripheral edema  Respiratory: good equal bilat air movement, with diffuse rhonchi and wheezing, slight tachypnea Abdomen: soft, nontender, nondistended, normal bowel tones heard  Skin: dry, no rashes  Musculoskeletal: no joint effusions, normal range of motion  Psychiatric: appropriate affect, normal speech  Neurologic: extraocular muscles intact, clear speech, moving all extremities with intact sensorium   Data Reviewed: CBC: Recent Labs  Lab 10/11/22 1059 10/12/22 0321  WBC 5.2 5.4  NEUTROABS 3.6 3.4  HGB 12.6 11.8*  HCT 40.5 38.3  MCV 96.0 97.2  PLT 154 782   Basic Metabolic  Panel: Recent Labs  Lab 10/11/22 1059 10/12/22 0321  NA 135 135  K 3.9 3.9  CL 104 102  CO2 23 27  GLUCOSE 146* 99  BUN 14 10  CREATININE 0.91 0.73  CALCIUM 8.9 8.8*   GFR: Estimated Creatinine Clearance: 49.1 mL/min (by C-G formula based on SCr of 0.73 mg/dL). Liver Function Tests: Recent Labs  Lab 10/11/22 1059  AST 30  ALT 22  ALKPHOS 108  BILITOT 0.6  PROT 6.4*  ALBUMIN 3.3*   No results for input(s): "LIPASE", "AMYLASE" in the last 168 hours. No results for input(s): "AMMONIA" in the last 168 hours. Coagulation Profile: No results for input(s): "INR", "PROTIME" in the last 168 hours. Cardiac Enzymes: No results for input(s): "CKTOTAL", "CKMB", "CKMBINDEX", "TROPONINI" in the last 168 hours. BNP (last 3 results) No results for input(s): "PROBNP" in the last 8760 hours. HbA1C: No results for input(s): "HGBA1C" in the last 72 hours. CBG: No results for input(s): "GLUCAP" in the last 168 hours. Lipid Profile: No results for input(s): "CHOL", "HDL", "LDLCALC", "TRIG", "CHOLHDL", "LDLDIRECT" in the last 72 hours. Thyroid Function Tests: No results for input(s): "TSH", "T4TOTAL", "FREET4", "T3FREE", "THYROIDAB" in the last 72 hours. Anemia Panel: No results for input(s): "VITAMINB12", "FOLATE", "FERRITIN", "TIBC", "IRON", "RETICCTPCT" in the last 72 hours. Urine analysis:    Component Value Date/Time   COLORURINE YELLOW 09/22/2020 1117   APPEARANCEUR CLEAR 09/22/2020 1117   LABSPEC 1.015 09/22/2020 1117   PHURINE 6.0 09/22/2020 1117   GLUCOSEU NEGATIVE 09/22/2020 1117   HGBUR NEGATIVE 09/22/2020 1117   BILIRUBINUR small 10/26/2021 1433   KETONESUR NEGATIVE 09/22/2020 1117   PROTEINUR Positive (A) 10/26/2021 1433   PROTEINUR NEGATIVE 01/08/2020 1736   UROBILINOGEN >=8.0 (A) 10/26/2021 1433   UROBILINOGEN 1.0 09/22/2020 1117   NITRITE positive 10/26/2021 1433   NITRITE Color Interference (A) 09/22/2020 1117   LEUKOCYTESUR Large (3+) (A) 10/26/2021 1433    LEUKOCYTESUR TRACE (A) 09/22/2020 1117   Sepsis Labs: '@LABRCNTIP'$ (procalcitonin:4,lacticidven:4)  ) Recent Results (from the past 240 hour(s))  Resp panel by RT-PCR (RSV, Flu A&B, Covid) Anterior Nasal Swab     Status: Abnormal   Collection Time: 10/11/22 11:00 AM   Specimen: Anterior Nasal Swab  Result Value Ref Range Status   SARS Coronavirus 2 by RT PCR NEGATIVE NEGATIVE Final    Comment: (NOTE) SARS-CoV-2 target nucleic acids are NOT DETECTED.  The SARS-CoV-2 RNA is generally detectable in upper respiratory specimens during the acute phase of infection. The lowest concentration of SARS-CoV-2 viral copies this assay can detect is 138 copies/mL. A negative result does not preclude SARS-Cov-2 infection and should not be used as the sole basis for treatment or other patient management decisions. A negative result may occur with  improper specimen collection/handling, submission of specimen other than nasopharyngeal swab, presence of viral mutation(s) within the areas targeted by this assay, and inadequate number of viral copies(<138 copies/mL). A negative result must be combined with clinical observations, patient history, and epidemiological information. The expected result is Negative.  Fact Sheet for Patients:  EntrepreneurPulse.com.au  Fact Sheet for Healthcare Providers:  IncredibleEmployment.be  This test is no t yet  approved or cleared by the [place] and  has been authorized for detection and/or diagnosis of SARS-CoV-2 by FDA under an Emergency Use Authorization (EUA). This EUA will remain  in effect (meaning this test can be used) for the duration of the COVID-19 declaration under Section 564(b)(1) of the Act, 21 U.S.C.section 360bbb-3(b)(1), unless the authorization is terminated  or revoked sooner.       Influenza A by PCR POSITIVE (A) NEGATIVE Final   Influenza B by PCR NEGATIVE NEGATIVE Final    Comment: (NOTE) The  Xpert Xpress SARS-CoV-2/FLU/RSV plus assay is intended as an aid in the diagnosis of influenza from Nasopharyngeal swab specimens and should not be used as a sole basis for treatment. Nasal washings and aspirates are unacceptable for Xpert Xpress SARS-CoV-2/FLU/RSV testing.  Fact Sheet for Patients: EntrepreneurPulse.com.au  Fact Sheet for Healthcare Providers: IncredibleEmployment.be  This test is not yet approved or cleared by the [place] FDA and has been authorized for detection and/or diagnosis of SARS-CoV-2 by FDA under an Emergency Use Authorization (EUA). This EUA will remain in effect (meaning this test can be used) for the duration of the COVID-19 declaration under Section 564(b)(1) of the Act, 21 U.S.C. section 360bbb-3(b)(1), unless the authorization is terminated or revoked.     Resp Syncytial Virus by PCR NEGATIVE NEGATIVE Final    Comment: (NOTE) Fact Sheet for Patients: EntrepreneurPulse.com.au  Fact Sheet for Healthcare Providers: IncredibleEmployment.be  This test is not yet approved or cleared by the [place] FDA and has been authorized for detection and/or diagnosis of SARS-CoV-2 by FDA under an Emergency Use Authorization (EUA). This EUA will remain in effect (meaning this test can be used) for the duration of the COVID-19 declaration under Section 564(b)(1) of the Act, 21 U.S.C. section 360bbb-3(b)(1), unless the authorization is terminated or revoked.  Performed at [facility], [address] [place], [place] [address]   Expectorated Sputum Assessment w Gram Stain, Rflx to Resp Cult     Status: None   Collection Time: 10/11/22  4:05 PM   Specimen: Urine, Random; Sputum  Result Value Ref Range Status   Specimen Description EXPECTORATED SPUTUM  Final   Special Requests NONE  Final   Sputum evaluation   Final    THIS SPECIMEN IS ACCEPTABLE FOR SPUTUM  CULTURE Performed at [facility], [address], [place], [place] [address]    Report Status 10/11/2022 FINAL  Final  Culture, Respiratory w Gram Stain     Status: None (Preliminary result)   Collection Time: 10/11/22  4:05 PM   Specimen: Sputum  Result Value Ref Range Status   Specimen Description   Final    SPU Performed at [facility], [place] [address] [place], [place] [address]    Special Requests   Final    NONE Reflexed from [id number] Performed at [facility], [place], [place] [address]    Gram Stain   Final    FEW WBC PRESENT, PREDOMINANTLY PMN RARE GRAM POSITIVE COCCI IN PAIRS RARE GRAM POSITIVE RODS Performed at [facility], [place] [address] [place], [place] [address]    Culture PENDING  Incomplete   Report Status PENDING  Incomplete     Studies: CT Angio Chest PE W and/or Wo Contrast  Result Date: 10/11/2022 CLINICAL DATA:  Pulmonary embolism suspected, high probability. Flu-like symptoms shortness of breath. EXAM: CT ANGIOGRAPHY CHEST WITH CONTRAST TECHNIQUE: Multidetector CT imaging of the chest was performed  using the standard protocol during bolus administration of intravenous contrast. Multiplanar CT image reconstructions and MIPs were obtained to evaluate the vascular anatomy. RADIATION DOSE REDUCTION: This exam was performed according to the departmental dose-optimization program which includes automated exposure control, adjustment of the mA and/or kV according to patient size and/or use of iterative reconstruction technique. CONTRAST:  68m OMNIPAQUE IOHEXOL 350 MG/ML SOLN COMPARISON:  Chest CT dated 05/05/2022. FINDINGS: Cardiovascular: Some of the most peripheral segmental and subsegmental pulmonary artery branches are difficult to definitively characterize due to patient breathing motion artifact, however, there is no pulmonary embolism identified within the main, lobar or segmental pulmonary arteries bilaterally. No  thoracic aortic aneurysm. Scattered aortic atherosclerosis. No pericardial effusion. Scattered coronary artery calcifications. Mediastinum/Nodes: No mass or enlarged lymph nodes are seen within the mediastinum or perihilar regions. Esophagus is unremarkable. Small hiatal hernia within the lower mediastinum. Trachea and central bronchi are unremarkable. Lungs/Pleura: Patchy ground-glass opacities throughout both lungs. No pleural effusion or pneumothorax. Upper Abdomen: Limited images of the upper abdomen are unremarkable. Musculoskeletal: Degenerative spondylosis of the slightly kyphotic thoracic spine. No acute-appearing osseous abnormality. Review of the MIP images confirms the above findings. IMPRESSION: 1. No pulmonary embolism identified, with mild study limitations detailed above. 2. Patchy ground-glass opacities throughout both lungs. Differential includes atypical/viral pneumonias, interstitial pneumonias, edema related to volume overload/CHF, hypersensitivity pneumonitis, and respiratory bronchiolitis. COVID pneumonia can have this appearance. 3. Small hiatal hernia. 4. Coronary artery calcifications. Electronically Signed   By: SFranki CabotM.D.   On: 10/11/2022 13:13   DG Chest 2 View  Result Date: 10/11/2022 CLINICAL DATA:  Provided history: Shortness of breath. Additional history provided: Flu like symptoms. EXAM: CHEST - 2 VIEW COMPARISON:  Prior chest radiographs 08/30/2022 and earlier. FINDINGS: Left chest multilead implantable cardiac device. Borderline cardiomegaly, unchanged. Aortic atherosclerosis. No appreciable airspace consolidation or pulmonary edema. No evidence of pleural effusion or pneumothorax. Thoracic spondylosis. IMPRESSION: No evidence of acute cardiopulmonary abnormality. Borderline cardiomegaly, unchanged. Aortic Atherosclerosis (ICD10-I70.0). Electronically Signed   By: KKellie SimmeringD.O.   On: 10/11/2022 11:54    Scheduled Meds:  amiodarone  200 mg Oral Daily   apixaban   5 mg Oral BID   DULoxetine  60 mg Oral QHS   gabapentin  600 mg Oral QHS   levothyroxine  100 mcg Oral Q0600   metoprolol tartrate  75 mg Oral BID   pantoprazole  40 mg Oral Daily   pravastatin  40 mg Oral q1800   sodium chloride HYPERTONIC  4 mL Nebulization Daily    Continuous Infusions:  levofloxacin (LEVAQUIN) IV 750 mg (10/12/22 0121)     LOS: 0 days   Time spent: 31 minutes  Solymar Grace MMarry Guan MD [facility] Pager [phone] If 7PM-7AM, please contact night-coverage [email or web address] Password [facility]1/17/2024, 8:46 AM

## 2022-10-12 NOTE — Progress Notes (Signed)
  Transition of Care [facility]) Screening Note   Patient Details  Name: Kerri Mills Date of Birth: 1931/05/16   [facility]) CM/SW Contact:    Quin Hoop, LCSW Phone Number: 10/12/2022, 10:15 AM    Transition of Care Department [facility]) has reviewed patient and no TOC needs have been identified at this time. We will continue to monitor patient advancement through interdisciplinary progression rounds. If new patient transition needs arise, please place a TOC consult.

## 2022-10-13 DIAGNOSIS — J9601 Acute respiratory failure with hypoxia: Secondary | ICD-10-CM | POA: Diagnosis not present

## 2022-10-13 LAB — LEGIONELLA PNEUMOPHILA SEROGP 1 UR AG: L. pneumophila Serogp 1 Ur Ag: NEGATIVE

## 2022-10-13 MED ORDER — PREDNISONE 20 MG PO TABS
40.0000 mg | ORAL_TABLET | Freq: Every day | ORAL | Status: DC
  Administered 2022-10-13 – 2022-10-14 (×2): 40 mg via ORAL
  Filled 2022-10-13 (×2): qty 2

## 2022-10-13 NOTE — Progress Notes (Signed)
PROGRESS NOTE  Kerri Mills NLG:[id number] DOB: 12-04-1930 DOA: 10/11/2022 PCP: Einar Pheasant, MD  Hospital Course/Subjective: Kerri Mills is a 89 y.o. female with medical history significant of tachy-brady syndrome s/p PPM, persistent atrial fibrillation on AC, bronchiectasis, hypertension, hyperlipidemia, breast cancer, thyroid cancer, OSA on CPAP, who presents to the ED with complaints of cough. She initially required oxygen 2L and was found to be Influenza A positive with patchy opacities seen on CTA of the chest.  Today states she feels so much better, got some rest and is breathing more easily. Worked with PT yesterday and has no skilled needs.  Assessment/Plan:  Principal Problem:   Acute hypoxic respiratory failure (HCC) Active Problems:   Obstructive sleep apnea   Atrial fibrillation (HCC)   Essential hypertension   Influenza A with pneumonia   Assessment and Plan: * Acute hypoxic respiratory failure ([place]) Kerri Mills is presenting with 1 week history of decreased appetite, rhinorrhea, productive cough and hypoxia with influenza A PCR positive and evidence of multifocal pneumonia on CTA.  Procalcitonin is negative which would support a viral only pneumonia, however given her indolent course of worsening shortness of breath in the setting of chronic bronchiectasis, there is concern for indolent bacterial infection as well. - Continue supplemental oxygen to maintain oxygen saturation above 88% - Wean as tolerated, patient is on room air at baseline, now down to 2L - Strep pneumo negative and Legionella urinary antigen pending - Expectorated sputum culture - Hypertonic saline nebulizer treatments -- given wheezing and rhonchi, started IV Solumedrol BID and scheduled Duonebs on 1/17 -- now much improved so will cut back to PO Prednisone and plan on taper - completed a course of empiric IV levofloxacin for bronchiectasis exacerbation on 1/17  Essential hypertension - Continue  home antihypertensives  Atrial fibrillation ([place]) Rate controlled at this time with AV pacing noted. -Continue home metoprolol, amiodarone and Eliquis  Obstructive sleep apnea - Continue home CPAP  DVT Prophylaxis: Eliquis  Code Status: DNR   Family Communication: Updated daughter Kerri Mills on the phone this AM.  Disposition Plan: Back to independent living when weaned to room air, in next 1-2 days.  Consultants: None   Procedures: None   Antimicrobials: Anti-infectives (From admission, onward)    Start     Dose/Rate Route Frequency Ordered Stop   10/11/22 2200  levofloxacin (LEVAQUIN) IVPB 750 mg  Status:  Discontinued        750 mg 100 mL/hr over 90 Minutes Intravenous Every 48 hours 10/11/22 2131 10/12/22 0931   10/11/22 1430  oseltamivir (TAMIFLU) capsule 75 mg        75 mg Oral  Once 10/11/22 1339 10/11/22 1415      Objective: Vitals:   10/12/22 1955 10/12/22 2034 10/13/22 0618 10/13/22 0719  BP: 108/63  122/82   Pulse: 83  80   Resp: 19  19   Temp: 98 F (36.7 C)  98.2 F (36.8 C)   TempSrc:      SpO2: 98% 99% 97% 96%  Weight:      Height:        Intake/Output Summary (Last 24 hours) at 10/13/2022 0840 Last data filed at 10/12/2022 1008 Gross per 24 hour  Intake 150.44 ml  Output --  Net 150.44 ml   Filed Weights   10/11/22 1126 10/11/22 1626  Weight: 76.2 kg 77.5 kg   Exam: General:  Alert, oriented, calm, in no acute distress, on 2L [place] Eyes: EOMI, clear conjuctivae, white  sclerea Neck: supple, no masses, trachea mildline  Cardiovascular: RRR, no murmurs or rubs, no peripheral edema  Respiratory: clear to auscultation bilaterally, no wheezes, no crackles, good equal bilateral air entry Abdomen: soft, nontender, nondistended, normal bowel tones heard  Skin: dry, no rashes  Musculoskeletal: no joint effusions, normal range of motion  Psychiatric: appropriate affect, normal speech  Neurologic: extraocular muscles intact, clear speech, moving all  extremities with intact sensorium   Data Reviewed: CBC: Recent Labs  Lab 10/11/22 1059 10/12/22 0321  WBC 5.2 5.4  NEUTROABS 3.6 3.4  HGB 12.6 11.8*  HCT 40.5 38.3  MCV 96.0 97.2  PLT 154 633    Basic Metabolic Panel: Recent Labs  Lab 10/11/22 1059 10/12/22 0321  NA 135 135  K 3.9 3.9  CL 104 102  CO2 23 27  GLUCOSE 146* 99  BUN 14 10  CREATININE 0.91 0.73  CALCIUM 8.9 8.8*    GFR: Estimated Creatinine Clearance: 49.1 mL/min (by C-G formula based on SCr of 0.73 mg/dL). Liver Function Tests: Recent Labs  Lab 10/11/22 1059  AST 30  ALT 22  ALKPHOS 108  BILITOT 0.6  PROT 6.4*  ALBUMIN 3.3*    No results for input(s): "LIPASE", "AMYLASE" in the last 168 hours. No results for input(s): "AMMONIA" in the last 168 hours. Coagulation Profile: No results for input(s): "INR", "PROTIME" in the last 168 hours. Cardiac Enzymes: No results for input(s): "CKTOTAL", "CKMB", "CKMBINDEX", "TROPONINI" in the last 168 hours. BNP (last 3 results) No results for input(s): "PROBNP" in the last 8760 hours. HbA1C: No results for input(s): "HGBA1C" in the last 72 hours. CBG: No results for input(s): "GLUCAP" in the last 168 hours. Lipid Profile: No results for input(s): "CHOL", "HDL", "LDLCALC", "TRIG", "CHOLHDL", "LDLDIRECT" in the last 72 hours. Thyroid Function Tests: No results for input(s): "TSH", "T4TOTAL", "FREET4", "T3FREE", "THYROIDAB" in the last 72 hours. Anemia Panel: No results for input(s): "VITAMINB12", "FOLATE", "FERRITIN", "TIBC", "IRON", "RETICCTPCT" in the last 72 hours. Urine analysis:    Component Value Date/Time   COLORURINE YELLOW 09/22/2020 1117   APPEARANCEUR CLEAR 09/22/2020 1117   LABSPEC 1.015 09/22/2020 1117   PHURINE 6.0 09/22/2020 1117   GLUCOSEU NEGATIVE 09/22/2020 1117   HGBUR NEGATIVE 09/22/2020 1117   BILIRUBINUR small 10/26/2021 1433   KETONESUR NEGATIVE 09/22/2020 1117   PROTEINUR Positive (A) 10/26/2021 1433   PROTEINUR NEGATIVE  01/08/2020 1736   UROBILINOGEN >=8.0 (A) 10/26/2021 1433   UROBILINOGEN 1.0 09/22/2020 1117   NITRITE positive 10/26/2021 1433   NITRITE Color Interference (A) 09/22/2020 1117   LEUKOCYTESUR Large (3+) (A) 10/26/2021 1433   LEUKOCYTESUR TRACE (A) 09/22/2020 1117   Sepsis Labs: '@LABRCNTIP'$ (procalcitonin:4,lacticidven:4)  ) Recent Results (from the past 240 hour(s))  Resp panel by RT-PCR (RSV, Flu A&B, Covid) Anterior Nasal Swab     Status: Abnormal   Collection Time: 10/11/22 11:00 AM   Specimen: Anterior Nasal Swab  Result Value Ref Range Status   SARS Coronavirus 2 by RT PCR NEGATIVE NEGATIVE Final    Comment: (NOTE) SARS-CoV-2 target nucleic acids are NOT DETECTED.  The SARS-CoV-2 RNA is generally detectable in upper respiratory specimens during the acute phase of infection. The lowest concentration of SARS-CoV-2 viral copies this assay can detect is 138 copies/mL. A negative result does not preclude SARS-Cov-2 infection and should not be used as the sole basis for treatment or other patient management decisions. A negative result may occur with  improper specimen collection/handling, submission of specimen other than nasopharyngeal swab, presence  of viral mutation(s) within the areas targeted by this assay, and inadequate number of viral copies(<138 copies/mL). A negative result must be combined with clinical observations, patient history, and epidemiological information. The expected result is Negative.  Fact Sheet for Patients:  EntrepreneurPulse.com.au  Fact Sheet for Healthcare Providers:  IncredibleEmployment.be  This test is no t yet approved or cleared by the [place] FDA and  has been authorized for detection and/or diagnosis of SARS-CoV-2 by FDA under an Emergency Use Authorization (EUA). This EUA will remain  in effect (meaning this test can be used) for the duration of the COVID-19 declaration under Section 564(b)(1) of  the Act, 21 U.S.C.section 360bbb-3(b)(1), unless the authorization is terminated  or revoked sooner.       Influenza A by PCR POSITIVE (A) NEGATIVE Final   Influenza B by PCR NEGATIVE NEGATIVE Final    Comment: (NOTE) The Xpert Xpress SARS-CoV-2/FLU/RSV plus assay is intended as an aid in the diagnosis of influenza from Nasopharyngeal swab specimens and should not be used as a sole basis for treatment. Nasal washings and aspirates are unacceptable for Xpert Xpress SARS-CoV-2/FLU/RSV testing.  Fact Sheet for Patients: EntrepreneurPulse.com.au  Fact Sheet for Healthcare Providers: IncredibleEmployment.be  This test is not yet approved or cleared by the [place] FDA and has been authorized for detection and/or diagnosis of SARS-CoV-2 by FDA under an Emergency Use Authorization (EUA). This EUA will remain in effect (meaning this test can be used) for the duration of the COVID-19 declaration under Section 564(b)(1) of the Act, 21 U.S.C. section 360bbb-3(b)(1), unless the authorization is terminated or revoked.     Resp Syncytial Virus by PCR NEGATIVE NEGATIVE Final    Comment: (NOTE) Fact Sheet for Patients: EntrepreneurPulse.com.au  Fact Sheet for Healthcare Providers: IncredibleEmployment.be  This test is not yet approved or cleared by the [place] FDA and has been authorized for detection and/or diagnosis of SARS-CoV-2 by FDA under an Emergency Use Authorization (EUA). This EUA will remain in effect (meaning this test can be used) for the duration of the COVID-19 declaration under Section 564(b)(1) of the Act, 21 U.S.C. section 360bbb-3(b)(1), unless the authorization is terminated or revoked.  Performed at [facility], [address] [place], [place] [address]   Expectorated Sputum Assessment w Gram Stain, Rflx to Resp Cult     Status: None   Collection Time: 10/11/22  4:05 PM    Specimen: Urine, Random; Sputum  Result Value Ref Range Status   Specimen Description EXPECTORATED SPUTUM  Final   Special Requests NONE  Final   Sputum evaluation   Final    THIS SPECIMEN IS ACCEPTABLE FOR SPUTUM CULTURE Performed at [facility], [address], [place], [place] [address]    Report Status 10/11/2022 FINAL  Final  Culture, Respiratory w Gram Stain     Status: None (Preliminary result)   Collection Time: 10/11/22  4:05 PM   Specimen: Sputum  Result Value Ref Range Status   Specimen Description   Final    SPU Performed at [facility], [place] [address] [place], [place] [address]    Special Requests   Final    NONE Reflexed from [id number] Performed at [facility], [place], [place] [address]    Gram Stain   Final    FEW WBC PRESENT, PREDOMINANTLY PMN RARE GRAM POSITIVE COCCI IN PAIRS RARE GRAM POSITIVE RODS    Culture   Final    CULTURE REINCUBATED FOR BETTER  GROWTH Performed at [facility], [place] [address] [place], [place] [address]    Report Status PENDING  Incomplete     Studies: No results found.  Scheduled Meds:  amiodarone  200 mg Oral Daily   apixaban  5 mg Oral BID   artificial tears  1 Application Both Eyes QHS   DULoxetine  60 mg Oral QHS   gabapentin  600 mg Oral QHS   ipratropium-albuterol  3 mL Nebulization QID   levothyroxine  100 mcg Oral Q0600   metoprolol tartrate  75 mg Oral BID   multivitamin  1 tablet Oral BID   pantoprazole  40 mg Oral Daily   pravastatin  40 mg Oral q1800   predniSONE  40 mg Oral Q breakfast   Continuous Infusions:    LOS: 1 day   Time spent: 24 minutes  Lauro Manlove Marry Guan, MD [facility] Pager [phone]  If 7PM-7AM, please contact night-coverage [email or web address] Password [email or web address] 10/13/2022, 8:40 AM

## 2022-10-13 NOTE — Progress Notes (Signed)
Mobility Specialist - Progress Note   10/13/22 0945  Mobility  Activity Ambulated with assistance in hallway;Stood at bedside;Dangled on edge of bed  Level of Assistance Standby assist, set-up cues, supervision of patient - no hands on  Assistive Device None  Distance Ambulated (ft) 400 ft  Activity Response Tolerated well  Mobility Referral Yes  $Mobility charge 1 Mobility   Pt semi supine in bed on 2L upon arrival. Pt STS and ambulates in hallway Supervision with no LOB noted. Pt returns to recliner with needs in reach.   Gretchen Short  Mobility Specialist  10/13/22 9:47 AM

## 2022-10-13 NOTE — Progress Notes (Signed)
Physical Therapy Treatment Patient Details Name: Kerri Mills MRN: [id number] DOB: 1931/02/14 Today's Date: 10/13/2022   History of Present Illness Kerri Mills is a 89 y.o. female with medical history significant of tachy-brady syndrome s/p PPM, persistent atrial fibrillation on AC, bronchiectasis, hypertension, hyperlipidemia, breast cancer, thyroid cancer, OSA on CPAP, who presents to the ED with complaints of cough. Patient is flu+    PT Comments    Patient agreeable to PT session. States she has been shaky today since shot last night for breathing. Patient motivated to return home and hopes to get off O2. Currently patient continues to require supplemental O2 as her saturations drop to 90% when on 2 liters ambulating. She does not require AD at this time, but is slightly unsteady with ambulation. Reports she feels unsteady without her shoes, so will try wearing shoes next session for increased stability. Patient will continue to benefit from skilled PT to assess O2 needs for home and improve safety and strength.        Recommendations for follow up therapy are one component of a multi-disciplinary discharge planning process, led by the attending physician.  Recommendations may be updated based on patient status, additional functional criteria and insurance authorization.  Follow Up Recommendations  Home health PT     Assistance Recommended at Discharge Intermittent Supervision/Assistance  Patient can return home with the following A little help with walking and/or transfers;A little help with bathing/dressing/bathroom;Assist for transportation;Help with stairs or ramp for entrance   Equipment Recommendations  None recommended by PT    Recommendations for Other Services       Precautions / Restrictions Precautions Precautions: Fall Precaution Comments: mod fall Restrictions Weight Bearing Restrictions: No     Mobility  Bed Mobility Overal bed mobility: Independent                   Transfers Overall transfer level: Needs assistance Equipment used: None Transfers: Sit to/from Stand Sit to Stand: Min guard                Ambulation/Gait Ambulation/Gait assistance: Min guard Gait Distance (Feet): 300 Feet Assistive device: None Gait Pattern/deviations: Step-through pattern, Decreased step length - right, Decreased step length - left, Decreased stride length, Narrow base of support Gait velocity: decr     General Gait Details: min guard with gait belt only. Shaky/mild unsteadiness this session with ambulation. 2 standing rest breaks with O2 sats down to 90% on 2 liters with mobility. With rest and PLB patient able to raise O2 sats back to mid 90%s. Continues to require O2 for mobility at this time.   Stairs             Wheelchair Mobility    Modified Rankin (Stroke Patients Only)       Balance Overall balance assessment: Needs assistance Sitting-balance support: Feet supported Sitting balance-Leahy Scale: Good     Standing balance support: No upper extremity supported, During functional activity Standing balance-Leahy Scale: Fair Standing balance comment: mild unsteadiness                            Cognition Arousal/Alertness: Awake/alert Behavior During Therapy: WFL for tasks assessed/performed Overall Cognitive Status: Within Functional Limits for tasks assessed  Exercises      General Comments        Pertinent Vitals/Pain      Home Living                          Prior Function            PT Goals (current goals can now be found in the care plan section) Acute Rehab PT Goals Patient Stated Goal: to return home PT Goal Formulation: With patient/family Time For Goal Achievement: 10/19/22 Potential to Achieve Goals: Good Progress towards PT goals: Progressing toward goals    Frequency    Min 2X/week      PT Plan  Discharge plan needs to be updated    Co-evaluation              AM-PAC PT "6 Clicks" Mobility   Outcome Measure  Help needed turning from your back to your side while in a flat bed without using bedrails?: None Help needed moving from lying on your back to sitting on the side of a flat bed without using bedrails?: None Help needed moving to and from a bed to a chair (including a wheelchair)?: A Little Help needed standing up from a chair using your arms (e.g., wheelchair or bedside chair)?: A Little Help needed to walk in hospital room?: A Little Help needed climbing 3-5 steps with a railing? : A Little 6 Click Score: 20    End of Session Equipment Utilized During Treatment: Gait belt;Oxygen Activity Tolerance: Patient tolerated treatment well Patient left: in bed;with call bell/phone within reach;with family/visitor present Nurse Communication: Mobility status PT Visit Diagnosis: Unsteadiness on feet (R26.81);Muscle weakness (generalized) (M62.81)     Time: 0932-6712 PT Time Calculation (min) (ACUTE ONLY): 18 min  Charges:  $Gait Training: 8-22 mins                     [employer], PT, [employer] 10/13/22,2:26 PM

## 2022-10-14 ENCOUNTER — Telehealth: Payer: Self-pay | Admitting: Internal Medicine

## 2022-10-14 DIAGNOSIS — J9601 Acute respiratory failure with hypoxia: Secondary | ICD-10-CM | POA: Diagnosis not present

## 2022-10-14 LAB — CULTURE, RESPIRATORY W GRAM STAIN: Culture: NORMAL

## 2022-10-14 MED ORDER — METOPROLOL TARTRATE 75 MG PO TABS: 75.0000 mg | ORAL_TABLET | Freq: Two times a day (BID) | ORAL | Status: DC

## 2022-10-14 MED ORDER — PREDNISONE 20 MG PO TABS: ORAL_TABLET | ORAL | 0 refills | Status: AC

## 2022-10-14 NOTE — Telephone Encounter (Signed)
Hospital called and said that patient was being released today from the hospital, she needs an appt in the next 2 weeks. Hospital stated she was in hospital for flu.

## 2022-10-14 NOTE — TOC Transition Note (Signed)
Transition of Care [facility]) - CM/SW Discharge Note   Patient Details  Name: Kerri Mills MRN: [id number] Date of Birth: [birth year]  [facility]) CM/SW Contact:  Quin Hoop, LCSW Phone Number: 10/14/2022, 12:43 PM   Clinical Narrative:   Patient discharging with HHPT services provided by [facility].  PT orders faxed to [facility].  Discharge summary is complete.  [name], Hilda Blades, is transporting pt home.    Final next level of care: [place] Barriers to Discharge: No Barriers Identified   Patient Goals and CMS Choice      Discharge Placement                    Name of family member notified: Hilda Blades [phone]    Discharge Plan and Services Additional resources added to the After Visit Summary for                            [facility] Arranged: PT [place] Agency:  (PT to be provided by [facility])        Social Determinants of Health (SDOH) Interventions SDOH Screenings   Food Insecurity: No Food Insecurity (10/11/2022)  Housing: Low Risk  (10/11/2022)  Transportation Needs: No Transportation Needs (10/11/2022)  Utilities: Not At Risk (10/11/2022)  Depression (PHQ2-9): Low Risk  (10/11/2022)  Financial Resource Strain: Low Risk  (02/08/2022)  Physical Activity: Sufficiently Active (02/08/2022)  Social Connections: Unknown (02/08/2022)  Stress: No Stress Concern Present (02/08/2022)  Tobacco Use: Low Risk  (10/11/2022)     Readmission Risk Interventions     No data to display

## 2022-10-14 NOTE — Progress Notes (Addendum)
CSW phoned pt's room, there was no answer.  Then phoned pt's dtr, Kerri Mills and lvm for her to return my call to discuss d/c planning.  Waiting for return call.  10:55am Spoke with pts dtr, Kerri Mills who stated that pt lives at [facility] and she can get PT at her place.  Confirmed with Kerri Mills at [facility].  Requesting orders.  Will fax them, along with d/c summary to [facility] (fax: [phone]).

## 2022-10-14 NOTE — Discharge Summary (Signed)
Discharge Summary  Kerri Mills ERX:[id number] DOB: 21-Jan-1931  PCP: Einar Pheasant, MD  Admit date: 10/11/2022 Discharge date: 10/14/2022  Recommendations for Outpatient Follow-up:  Please follow up with your PCP with CBC and BMP in 1-2 weeks  Discharge Diagnoses:  Active Hospital Problems   Diagnosis Date Noted   Acute hypoxic respiratory failure ([place]) 10/11/2022    Priority: 1.   Influenza A with pneumonia 10/12/2022   Essential hypertension    Obstructive sleep apnea 06/15/2013   Atrial fibrillation ([place]) 06/15/2013    Resolved Hospital Problems  No resolved problems to display.   Discharge Condition: Stable on room air  Diet recommendation: Diet Orders (From admission, onward)     Start     Ordered   10/11/22 1518  Diet regular Room service appropriate? Yes; Fluid consistency: Thin  Diet effective now       Question Answer Comment  Room service appropriate? Yes   Fluid consistency: Thin      10/11/22 1518           HPI and Brief Hospital Course:  This is a pleasant and active 89 year old female with a history of tachybradycardia syndrome, persistent atrial fibrillation on Eliquis, bronchiectasis, hypertension, hyperlipidemia who presented to the hospital with complaints of cough.  Kerri Mills initially required 2 L nasal cannula oxygen supplementation, was found to be influenza A positive along with patchy opacities seen on CTA of the chest.  He was admitted to the hospitalist service, placed on oxygen support, and treated with a course of IV Levaquin in the hospital.  Due to significant wheezing and rhonchi, she was started on IV Solu-Medrol and scheduled DuoNebs on 1/17.  After this, she has had significant improvement, with complete resolution of wheezing and rhonchi, and she has been weaned to room air.  She has worked with physical therapy, recommends home health at discharge.  Today, the Kerri Mills has remained on room air, ambulation with physical therapy.  He has  completed a course of antibiotics here in the hospital, will discharge home today with a short tapering course of steroids.  Plan of care discussed in detail with the Kerri Mills this morning, she understands and is agreeable to discharge home today.  Procedures: None   Consultations: None   Discharge details, plan of care and follow up instructions were discussed with Kerri Mills and any available family or care providers. Kerri Mills and family are in agreement with discharge from the hospital today and all questions were answered to their satisfaction.  Discharge Exam: BP 110/60 (BP Location: Left Arm)   Pulse 80   Temp 97.7 F (36.5 C) (Oral)   Resp 19   Ht '5\' 5"'$  (1.651 m)   Wt 77.5 kg   SpO2 95%   BMI 28.43 kg/m  General:  Alert, oriented x4, calm, in no acute distress, resting comfortably sitting on a chair at the bedside this morning on room air Eyes: EOMI, clear sclerea Neck: supple, no masses, trachea mildline  Cardiovascular: RRR, no murmurs or rubs, no peripheral edema  Respiratory: clear to auscultation bilaterally, no wheezes, no crackles  Abdomen: soft, nontender, nondistended, normal bowel tones heard  Skin: dry, no rashes  Musculoskeletal: no joint effusions, normal range of motion  Psychiatric: appropriate affect, normal speech  Neurologic: extraocular muscles intact, clear speech, moving all extremities with intact sensorium   Discharge Instructions You were cared for by a hospitalist during your hospital stay. If you have any questions about your discharge medications or the care you received  while you were in the hospital after you are discharged, you can call the unit and asked to speak with the hospitalist on call if the hospitalist that took care of you is not available. Once you are discharged, your primary care physician will handle any further medical issues. Please note that NO REFILLS for any discharge medications will be authorized once you are discharged, as it is  imperative that you return to your primary care physician (or establish a relationship with a primary care physician if you do not have one) for your aftercare needs so that they can reassess your need for medications and monitor your lab values.   Allergies as of 10/14/2022       Reactions   Lipitor [atorvastatin Calcium] Other (See Comments)   Stiffness & soreness   Penicillins Rash        Medication List     STOP taking these medications    furosemide 20 MG tablet Commonly known as: LASIX   potassium chloride 10 MEQ tablet Commonly known as: KLOR-CON       TAKE these medications    acetaminophen 650 MG CR tablet Commonly known as: TYLENOL Take 650 mg by mouth every 8 (eight) hours as needed for pain.   albuterol 108 (90 Base) MCG/ACT inhaler Commonly known as: VENTOLIN HFA Inhale 2 puffs into the lungs every 6 (six) hours as needed for wheezing or shortness of breath.   Align 4 MG Caps One capsule daily while on antibiotic and for two weeks after complete antibiotic   amiodarone 200 MG tablet Commonly known as: PACERONE TAKE 1 TABLET BY MOUTH DAILY   azelastine 0.1 % nasal spray Commonly known as: ASTELIN Place 1 spray into both nostrils 2 (two) times daily. Use in each nostril as directed   cholecalciferol 25 MCG (1000 UNIT) tablet Commonly known as: VITAMIN D3 Take 2,000 Units by mouth daily.   cyanocobalamin 1000 MCG tablet Commonly known as: VITAMIN B12 Take 1,000 mcg by mouth daily.   DULoxetine 60 MG capsule Commonly known as: CYMBALTA TAKE ONE CAPSULE AT BEDTIME   Eliquis 5 MG Tabs tablet Generic drug: apixaban TAKE ONE TABLET BY MOUTH TWICE DAILY   gabapentin 300 MG capsule Commonly known as: NEURONTIN TAKE 2 CAPSULES BY MOUTH AT BEDTIME   GENTEAL TEARS NIGHT-TIME OP Place 1 application into both eyes at bedtime. Night time ointment 3.5g   levothyroxine 100 MCG tablet Commonly known as: SYNTHROID TAKE 1 TABLET EVERY DAY ON EMPTY  STOMACHWITH A GLASS OF WATER AT LEAST 30-60 MINBEFORE BREAKFAST   lovastatin 40 MG tablet Commonly known as: MEVACOR TAKE 1 TABLET BY MOUTH DAILY   Metoprolol Tartrate 75 MG Tabs Take 1 tablet (75 mg total) by mouth 2 (two) times daily. What changed:  medication strength See the new instructions.   omeprazole 20 MG capsule Commonly known as: PRILOSEC TAKE 1 CAPSULE BY MOUTH ONCE DAILY   polyethylene glycol powder 17 GM/SCOOP powder Commonly known as: GLYCOLAX/MIRALAX Take 17 g by mouth daily.   predniSONE 20 MG tablet Commonly known as: DELTASONE Take 2 tablets (40 mg total) by mouth daily with breakfast for 2 days, THEN 1 tablet (20 mg total) daily with breakfast for 2 days. Start taking on: October 15, 2022   PRESERVISION AREDS 2+MULTI VIT PO Take 1 tablet by mouth 2 (two) times daily.   SYSTANE OP Place 1 drop into both eyes daily as needed (for dry eyes).   triamcinolone cream 0.1 % Commonly known as:  KENALOG Apply 1 application. topically 2 (two) times daily.       Allergies  Allergen Reactions   Lipitor [Atorvastatin Calcium] Other (See Comments)    Stiffness & soreness   Penicillins Rash    Follow-up Information     Einar Pheasant, MD Follow up in 2 week(s).   Specialty: Internal Medicine Contact information: [address] [place] [address] [phone]                 The results of significant diagnostics from this hospitalization (including imaging, microbiology, ancillary and laboratory) are listed below for reference.    Significant Diagnostic Studies: CT Angio Chest PE W and/or Wo Contrast  Result Date: 10/11/2022 CLINICAL DATA:  Pulmonary embolism suspected, high probability. Flu-like symptoms shortness of breath. EXAM: CT ANGIOGRAPHY CHEST WITH CONTRAST TECHNIQUE: Multidetector CT imaging of the chest was performed using the standard protocol during bolus administration of intravenous contrast. Multiplanar  CT image reconstructions and MIPs were obtained to evaluate the vascular anatomy. RADIATION DOSE REDUCTION: This exam was performed according to the departmental dose-optimization program which includes automated exposure control, adjustment of the mA and/or kV according to Kerri Mills size and/or use of iterative reconstruction technique. CONTRAST:  61m OMNIPAQUE IOHEXOL 350 MG/ML SOLN COMPARISON:  Chest CT dated 05/05/2022. FINDINGS: Cardiovascular: Some of the most peripheral segmental and subsegmental pulmonary artery branches are difficult to definitively characterize due to Kerri Mills breathing motion artifact, however, there is no pulmonary embolism identified within the main, lobar or segmental pulmonary arteries bilaterally. No thoracic aortic aneurysm. Scattered aortic atherosclerosis. No pericardial effusion. Scattered coronary artery calcifications. Mediastinum/Nodes: No mass or enlarged lymph nodes are seen within the mediastinum or perihilar regions. Esophagus is unremarkable. Small hiatal hernia within the lower mediastinum. Trachea and central bronchi are unremarkable. Lungs/Pleura: Patchy ground-glass opacities throughout both lungs. No pleural effusion or pneumothorax. Upper Abdomen: Limited images of the upper abdomen are unremarkable. Musculoskeletal: Degenerative spondylosis of the slightly kyphotic thoracic spine. No acute-appearing osseous abnormality. Review of the MIP images confirms the above findings. IMPRESSION: 1. No pulmonary embolism identified, with mild study limitations detailed above. 2. Patchy ground-glass opacities throughout both lungs. Differential includes atypical/viral pneumonias, interstitial pneumonias, edema related to volume overload/CHF, hypersensitivity pneumonitis, and respiratory bronchiolitis. COVID pneumonia can have this appearance. 3. Small hiatal hernia. 4. Coronary artery calcifications. Electronically Signed   By: SFranki CabotM.D.   On: 10/11/2022 13:13   DG  Chest 2 View  Result Date: 10/11/2022 CLINICAL DATA:  Provided history: Shortness of breath. Additional history provided: Flu like symptoms. EXAM: CHEST - 2 VIEW COMPARISON:  Prior chest radiographs 08/30/2022 and earlier. FINDINGS: Left chest multilead implantable cardiac device. Borderline cardiomegaly, unchanged. Aortic atherosclerosis. No appreciable airspace consolidation or pulmonary edema. No evidence of pleural effusion or pneumothorax. Thoracic spondylosis. IMPRESSION: No evidence of acute cardiopulmonary abnormality. Borderline cardiomegaly, unchanged. Aortic Atherosclerosis (ICD10-I70.0). Electronically Signed   By: KKellie SimmeringD.O.   On: 10/11/2022 11:54    Microbiology: Recent Results (from the past 240 hour(s))  Resp panel by RT-PCR (RSV, Flu A&B, Covid) Anterior Nasal Swab     Status: Abnormal   Collection Time: 10/11/22 11:00 AM   Specimen: Anterior Nasal Swab  Result Value Ref Range Status   SARS Coronavirus 2 by RT PCR NEGATIVE NEGATIVE Final    Comment: (NOTE) SARS-CoV-2 target nucleic acids are NOT DETECTED.  The SARS-CoV-2 RNA is generally detectable in upper respiratory specimens during the acute phase of infection. The lowest concentration of  SARS-CoV-2 viral copies this assay can detect is 138 copies/mL. A negative result does not preclude SARS-Cov-2 infection and should not be used as the sole basis for treatment or other Kerri Mills management decisions. A negative result may occur with  improper specimen collection/handling, submission of specimen other than nasopharyngeal swab, presence of viral mutation(s) within the areas targeted by this assay, and inadequate number of viral copies(<138 copies/mL). A negative result must be combined with clinical observations, Kerri Mills history, and epidemiological information. The expected result is Negative.  Fact Sheet for Patients:  EntrepreneurPulse.com.au  Fact Sheet for Healthcare Providers:   IncredibleEmployment.be  This test is no t yet approved or cleared by the [place] FDA and  has been authorized for detection and/or diagnosis of SARS-CoV-2 by FDA under an Emergency Use Authorization (EUA). This EUA will remain  in effect (meaning this test can be used) for the duration of the COVID-19 declaration under Section 564(b)(1) of the Act, 21 U.S.C.section 360bbb-3(b)(1), unless the authorization is terminated  or revoked sooner.       Influenza A by PCR POSITIVE (A) NEGATIVE Final   Influenza B by PCR NEGATIVE NEGATIVE Final    Comment: (NOTE) The Xpert Xpress SARS-CoV-2/FLU/RSV plus assay is intended as an aid in the diagnosis of influenza from Nasopharyngeal swab specimens and should not be used as a sole basis for treatment. Nasal washings and aspirates are unacceptable for Xpert Xpress SARS-CoV-2/FLU/RSV testing.  Fact Sheet for Patients: EntrepreneurPulse.com.au  Fact Sheet for Healthcare Providers: IncredibleEmployment.be  This test is not yet approved or cleared by the [place] FDA and has been authorized for detection and/or diagnosis of SARS-CoV-2 by FDA under an Emergency Use Authorization (EUA). This EUA will remain in effect (meaning this test can be used) for the duration of the COVID-19 declaration under Section 564(b)(1) of the Act, 21 U.S.C. section 360bbb-3(b)(1), unless the authorization is terminated or revoked.     Resp Syncytial Virus by PCR NEGATIVE NEGATIVE Final    Comment: (NOTE) Fact Sheet for Patients: EntrepreneurPulse.com.au  Fact Sheet for Healthcare Providers: IncredibleEmployment.be  This test is not yet approved or cleared by the [place] FDA and has been authorized for detection and/or diagnosis of SARS-CoV-2 by FDA under an Emergency Use Authorization (EUA). This EUA will remain in effect (meaning this test can be used)  for the duration of the COVID-19 declaration under Section 564(b)(1) of the Act, 21 U.S.C. section 360bbb-3(b)(1), unless the authorization is terminated or revoked.  Performed at [facility], [address] [place], [place] [address]   Expectorated Sputum Assessment w Gram Stain, Rflx to Resp Cult     Status: None   Collection Time: 10/11/22  4:05 PM   Specimen: Urine, Random; Sputum  Result Value Ref Range Status   Specimen Description EXPECTORATED SPUTUM  Final   Special Requests NONE  Final   Sputum evaluation   Final    THIS SPECIMEN IS ACCEPTABLE FOR SPUTUM CULTURE Performed at [facility], [address] [place], [place] [address]    Report Status 10/11/2022 FINAL  Final  Culture, Respiratory w Gram Stain     Status: None   Collection Time: 10/11/22  4:05 PM   Specimen: Sputum  Result Value Ref Range Status   Specimen Description   Final    SPU Performed at [facility], [place] [address] [place], [place] [address]    Special Requests   Final    NONE Reflexed from [id number] Performed at  [facility], [place], [place] [address]    Gram Stain   Final    FEW WBC PRESENT, PREDOMINANTLY PMN RARE GRAM POSITIVE COCCI IN PAIRS RARE GRAM POSITIVE RODS    Culture   Final    Normal respiratory flora-no Staph aureus or Pseudomonas seen Performed at [facility] [address], [place], [place] [address]    Report Status 10/14/2022 FINAL  Final     Labs: Basic Metabolic Panel: Recent Labs  Lab 10/11/22 1059 10/12/22 0321  NA 135 135  K 3.9 3.9  CL 104 102  CO2 23 27  GLUCOSE 146* 99  BUN 14 10  CREATININE 0.91 0.73  CALCIUM 8.9 8.8*   Liver Function Tests: Recent Labs  Lab 10/11/22 1059  AST 30  ALT 22  ALKPHOS 108  BILITOT 0.6  PROT 6.4*  ALBUMIN 3.3*   No results for input(s): "LIPASE", "AMYLASE" in the last 168 hours. No results for input(s): "AMMONIA" in the last 168 hours. CBC: Recent Labs   Lab 10/11/22 1059 10/12/22 0321  WBC 5.2 5.4  NEUTROABS 3.6 3.4  HGB 12.6 11.8*  HCT 40.5 38.3  MCV 96.0 97.2  PLT 154 153   Cardiac Enzymes: No results for input(s): "CKTOTAL", "CKMB", "CKMBINDEX", "TROPONINI" in the last 168 hours. BNP: BNP (last 3 results) No results for input(s): "BNP" in the last 8760 hours.  ProBNP (last 3 results) No results for input(s): "PROBNP" in the last 8760 hours.  CBG: No results for input(s): "GLUCAP" in the last 168 hours.  Time spent: > 30 minutes were spent in preparing this discharge including medication reconciliation, counseling, and coordination of care.  Signed:  Shayleen Eppinger Marry Guan, MD  [facility] 10/14/2022, 12:20 PM

## 2022-10-14 NOTE — Progress Notes (Signed)
Physical Therapy Treatment Patient Details Name: Kerri Mills MRN: [id number] DOB: 04-26-1931 Today's Date: 10/14/2022   History of Present Illness Kerri Mills is a 89 y.o. female with medical history significant of tachy-brady syndrome s/p PPM, persistent atrial fibrillation on AC, bronchiectasis, hypertension, hyperlipidemia, breast cancer, thyroid cancer, OSA on CPAP, who presents to the ED with complaints of cough. Patient is flu+    PT Comments    Patient doing well today. Daughter at bedside. Patient reports she may be going home. She is now on room air with sats > 90%. She is mod I with bed mobility and transfers. Ambulated 200 feet without AD, no lob, O2 saturations > 94% during ambulation on room air. She will continue to benefit from skilled PT to improve endurance and strength.      Recommendations for follow up therapy are one component of a multi-disciplinary discharge planning process, led by the attending physician.  Recommendations may be updated based on patient status, additional functional criteria and insurance authorization.  Follow Up Recommendations  Home health PT     Assistance Recommended at Discharge Intermittent Supervision/Assistance  Patient can return home with the following A little help with walking and/or transfers;A little help with bathing/dressing/bathroom;Assist for transportation;Help with stairs or ramp for entrance   Equipment Recommendations  None recommended by PT    Recommendations for Other Services       Precautions / Restrictions Precautions Precaution Comments: mod fall Restrictions Weight Bearing Restrictions: No     Mobility  Bed Mobility Overal bed mobility: Independent                  Transfers Overall transfer level: Modified independent Equipment used: None Transfers: Sit to/from Stand Sit to Stand: Modified independent (Device/Increase time)                Ambulation/Gait Ambulation/Gait assistance:  Supervision Gait Distance (Feet): 200 Feet Assistive device: None Gait Pattern/deviations: Step-through pattern Gait velocity: WNL     General Gait Details: patient with improved stability today and improved cadence. Mild SOB with walking. O2 sats remained > 94% with mobility/ambulation this session on room air.   Stairs             Wheelchair Mobility    Modified Rankin (Stroke Patients Only)       Balance Overall balance assessment: Mild deficits observed, not formally tested Sitting-balance support: Feet supported Sitting balance-Leahy Scale: Normal     Standing balance support: No upper extremity supported, During functional activity Standing balance-Leahy Scale: Good Standing balance comment: mild unsteadiness, improved from yesterday ( shoes donned for amblation today)                            Cognition Arousal/Alertness: Awake/alert Behavior During Therapy: WFL for tasks assessed/performed Overall Cognitive Status: Within Functional Limits for tasks assessed                                          Exercises      General Comments        Pertinent Vitals/Pain Pain Assessment Pain Assessment: No/denies pain    Home Living                          Prior Function  PT Goals (current goals can now be found in the care plan section) Acute Rehab PT Goals Patient Stated Goal: to return home PT Goal Formulation: With patient/family Time For Goal Achievement: 10/19/22 Potential to Achieve Goals: Good Progress towards PT goals: Progressing toward goals    Frequency    Min 2X/week      PT Plan Current plan remains appropriate    Co-evaluation              AM-PAC PT "6 Clicks" Mobility   Outcome Measure  Help needed turning from your back to your side while in a flat bed without using bedrails?: None Help needed moving from lying on your back to sitting on the side of a flat bed  without using bedrails?: None Help needed moving to and from a bed to a chair (including a wheelchair)?: None Help needed standing up from a chair using your arms (e.g., wheelchair or bedside chair)?: None Help needed to walk in hospital room?: A Little Help needed climbing 3-5 steps with a railing? : A Little 6 Click Score: 22    End of Session Equipment Utilized During Treatment: Gait belt Activity Tolerance: Patient tolerated treatment well Patient left: in bed;with call bell/phone within reach;with family/visitor present Nurse Communication: Mobility status PT Visit Diagnosis: Muscle weakness (generalized) (M62.81)     Time: 1140-1151 PT Time Calculation (min) (ACUTE ONLY): 11 min  Charges:  $Gait Training: 8-22 mins                     Linsy Ehresman, PT, GCS 10/14/22,12:00 PM

## 2022-10-17 ENCOUNTER — Other Ambulatory Visit: Payer: Self-pay | Admitting: Internal Medicine

## 2022-10-17 ENCOUNTER — Telehealth: Payer: Self-pay | Admitting: *Deleted

## 2022-10-17 NOTE — Progress Notes (Signed)
  Care Coordination  Note  10/17/2022 Name: Kerri Mills MRN: [id number] DOB: 02-Apr-1931  Kerri Mills is a 89 y.o. year old primary care patient of Einar Pheasant, MD. I reached out to [name] by phone today to assist with scheduling a follow up appointment. [name] verbally consented to my assistance.       Follow up plan: Hospital Follow Up appointment scheduled with (Dr Derrel Nip) on (10/20/2022) at (11am).  Julian Hy, [place] Direct Dial: [phone]

## 2022-10-17 NOTE — Patient Outreach (Signed)
  Care Coordination [facility] Note Transition Care Management Follow-up Telephone Call Date of discharge and from where: [facility] [phone] influenza How have you been since you were released from the hospital? Doing ok Any questions or concerns? Yes  Items Reviewed: Did the pt receive and understand the discharge instructions provided? Yes  Medications obtained and verified? No  Patient had not received her prednisone from the pharmacy. RN called the pharmacy to locate medication. They stated they will be sending it out today. They stated they had received the order after the last delivery was sent out.  Per patient she had not received herr synthroid. Pharmacy explained to RN how to have patient check the bottle to see if it was the latest bottle that was sent out.  Rn went through the process with the patient and the bottle she had is not the latest. She will look through her pill basket.  Any new allergies since your discharge? No  Dietary orders reviewed? No Do you have support at home? Yes   Home Care and Equipment/Supplies: Were home health services ordered? yes If so, what is the [facility] of the agency? [facility]  Has the agency set up a time to come to the patient's home? no Were any new equipment or medical supplies ordered?  No What is the name of the medical supply agency? N/a Were you able to get the supplies/equipment? not applicable Do you have any questions related to the use of the equipment or supplies? No  Functional Questionnaire: (I = Independent and D = Dependent) ADLs: I  Bathing/Dressing- I  Meal Prep- I  Eating- I  Maintaining continence- I  Transferring/Ambulation- I  Managing Meds- I  Follow up appointments reviewed:  PCP Hospital f/u appt confirmed? No  RN referred for follow up appointment Specialist Hospital f/u appt confirmed? N  Are transportation arrangements needed? No  If their condition worsens, is the pt aware to call PCP or go to the Emergency  Dept.? Yes Was the patient provided with contact information for the PCP's office or ED? Yes Was to pt encouraged to call back with questions or concerns? Yes  SDOH assessments and interventions completed:   Yes SDOH Interventions Today    Flowsheet Row Most Recent Value  SDOH Interventions   Food Insecurity Interventions Intervention Not Indicated  Housing Interventions Intervention Not Indicated  Transportation Interventions Intervention Not Indicated       Care Coordination Interventions:  PCP follow up appointment requested RN called pharmacy to locate medications patient had not received.     Encounter Outcome:  Pt. Visit Completed    [facility] [phone]

## 2022-10-17 NOTE — Progress Notes (Signed)
  Care Coordination  Note  10/17/2022 Name: MARIELENA HARVELL MRN: [id number] DOB: 01/20/1931  INDIAH HEYDEN is a 89 y.o. year old primary care patient of Einar Pheasant, MD. I reached out to [name] by phone today to assist with scheduling a follow up appointment. [name] verbally consented to my assistance.       Follow up plan: Unsuccessful telephone outreach attempt made. A HIPAA compliant phone message was left for the patient providing contact information and requesting a return call.   Julian Hy, [place] Direct Dial: [phone]

## 2022-10-18 ENCOUNTER — Ambulatory Visit: Payer: PPO | Attending: Cardiology

## 2022-10-18 DIAGNOSIS — I495 Sick sinus syndrome: Secondary | ICD-10-CM | POA: Diagnosis not present

## 2022-10-18 LAB — CUP PACEART REMOTE DEVICE CHECK
Battery Remaining Longevity: 60 mo
Battery Remaining Percentage: 73 %
Battery Voltage: 2.98 V
Brady Statistic AP VP Percent: 71 %
Brady Statistic AP VS Percent: 28 %
Brady Statistic AS VP Percent: 1 %
Brady Statistic AS VS Percent: 1 %
Brady Statistic RA Percent Paced: 98 %
Brady Statistic RV Percent Paced: 71 %
Date Time Interrogation Session: 20240123040016
Implantable Lead Connection Status: 753985
Implantable Lead Connection Status: 753985
Implantable Lead Implant Date: 20210416
Implantable Lead Implant Date: 20210416
Implantable Lead Location: 753859
Implantable Lead Location: 753860
Implantable Pulse Generator Implant Date: 20210416
Lead Channel Impedance Value: 350 Ohm
Lead Channel Impedance Value: 440 Ohm
Lead Channel Pacing Threshold Amplitude: 1 V
Lead Channel Pacing Threshold Amplitude: 1 V
Lead Channel Pacing Threshold Pulse Width: 0.5 ms
Lead Channel Pacing Threshold Pulse Width: 0.5 ms
Lead Channel Sensing Intrinsic Amplitude: 1.1 mV
Lead Channel Sensing Intrinsic Amplitude: 12 mV
Lead Channel Setting Pacing Amplitude: 2 V
Lead Channel Setting Pacing Amplitude: 2.5 V
Lead Channel Setting Pacing Pulse Width: 0.5 ms
Lead Channel Setting Sensing Sensitivity: 2 mV
Pulse Gen Model: 2272
Pulse Gen Serial Number: 3813093

## 2022-10-18 NOTE — Telephone Encounter (Signed)
See other note for TCM call.

## 2022-10-20 ENCOUNTER — Ambulatory Visit (INDEPENDENT_AMBULATORY_CARE_PROVIDER_SITE_OTHER): Payer: PPO | Admitting: Internal Medicine

## 2022-10-20 VITALS — BP 132/80 | HR 82 | Temp 97.8°F | Ht 65.0 in | Wt 170.8 lb

## 2022-10-20 DIAGNOSIS — I10 Essential (primary) hypertension: Secondary | ICD-10-CM

## 2022-10-20 DIAGNOSIS — Z09 Encounter for follow-up examination after completed treatment for conditions other than malignant neoplasm: Secondary | ICD-10-CM

## 2022-10-20 DIAGNOSIS — J1100 Influenza due to unidentified influenza virus with unspecified type of pneumonia: Secondary | ICD-10-CM | POA: Diagnosis not present

## 2022-10-20 DIAGNOSIS — D649 Anemia, unspecified: Secondary | ICD-10-CM | POA: Diagnosis not present

## 2022-10-20 LAB — CBC WITH DIFFERENTIAL/PLATELET
Basophils Absolute: 0 10*3/uL (ref 0.0–0.1)
Basophils Relative: 0.3 % (ref 0.0–3.0)
Eosinophils Absolute: 0 10*3/uL (ref 0.0–0.7)
Eosinophils Relative: 0.5 % (ref 0.0–5.0)
HCT: 41.4 % (ref 36.0–46.0)
Hemoglobin: 13.6 g/dL (ref 12.0–15.0)
Lymphocytes Relative: 20 % (ref 12.0–46.0)
Lymphs Abs: 2 10*3/uL (ref 0.7–4.0)
MCHC: 32.9 g/dL (ref 30.0–36.0)
MCV: 93.6 fl (ref 78.0–100.0)
Monocytes Absolute: 1 10*3/uL (ref 0.1–1.0)
Monocytes Relative: 9.7 % (ref 3.0–12.0)
Neutro Abs: 6.9 10*3/uL (ref 1.4–7.7)
Neutrophils Relative %: 69.5 % (ref 43.0–77.0)
Platelets: 370 10*3/uL (ref 150.0–400.0)
RBC: 4.42 Mil/uL (ref 3.87–5.11)
RDW: 13.3 % (ref 11.5–15.5)
WBC: 9.9 10*3/uL (ref 4.0–10.5)

## 2022-10-20 LAB — BASIC METABOLIC PANEL
BUN: 14 mg/dL (ref 6–23)
CO2: 30 mEq/L (ref 19–32)
Calcium: 9.7 mg/dL (ref 8.4–10.5)
Chloride: 102 mEq/L (ref 96–112)
Creatinine, Ser: 0.98 mg/dL (ref 0.40–1.20)
GFR: 51.24 mL/min — ABNORMAL LOW (ref >60.00)
Glucose, Bld: 87 mg/dL (ref 70–99)
Potassium: 4.2 mEq/L (ref 3.5–5.1)
Sodium: 141 mEq/L (ref 135–145)

## 2022-10-20 LAB — IBC + FERRITIN
Ferritin: 52.2 ng/mL (ref 10.0–291.0)
Iron: 167 ug/dL — ABNORMAL HIGH (ref 42–145)
Saturation Ratios: 47.3 % (ref 20.0–50.0)
TIBC: 352.8 ug/dL (ref 250.0–450.0)
Transferrin: 252 mg/dL (ref 212.0–360.0)

## 2022-10-20 LAB — B12 AND FOLATE PANEL
Folate: 14.1 ng/mL (ref >5.9)
Vitamin B-12: 464 pg/mL (ref 211–911)

## 2022-10-20 MED ORDER — DOXYCYCLINE HYCLATE 100 MG PO TABS: 100.0000 mg | ORAL_TABLET | Freq: Two times a day (BID) | ORAL | 0 refills | Status: DC

## 2022-10-20 MED ORDER — BENZONATATE 200 MG PO CAPS: 200.0000 mg | ORAL_CAPSULE | Freq: Three times a day (TID) | ORAL | 1 refills | Status: DC | PRN

## 2022-10-20 MED ORDER — AZITHROMYCIN 500 MG PO TABS: 500.0000 mg | ORAL_TABLET | Freq: Every day | ORAL | 0 refills | Status: DC

## 2022-10-20 NOTE — Patient Instructions (Signed)
Please take the doxycycline  Twice daily WITH FOOD to prevent nausea  Tessalon perles for the cough.  May take every 8 hours   Return in 2 weeks

## 2022-10-20 NOTE — Progress Notes (Signed)
Subjective:  Patient ID: Kerri Mills, female    DOB: 07-10-1931  Age: 89 y.o. MRN: [id number]  CC: The primary encounter diagnosis was Essential hypertension. Diagnoses of Anemia, unspecified type, Pneumonia and influenza, and Hospital discharge follow-up were also pertinent to this visit.   HPI Kerri Mills presents for  Chief Complaint  Patient presents with   Hospitalization Follow-up    Pt seen in ED 10/11/22 for Flu. Pt reports that she is still weak.    89 YR OLD FEMALE WITH atrial fibrillation , heart block , bronchiectasis and OSA admitted to [facility] on Jan 16 with hypoxic respiratory failure secondary to influenza A and atypical Pneumonia (by CT scan, done to rule out PE) .  She presented with a history of  malaise and anorexia that had started 7 days prior  which progressed to  wheezing and coughing  4 days prior to admission. COVID test was negative at home and ER test.  She was not treated with full course of tamiflu due to symptoms onset being > 2 days .  She did not tolerate high dose steroids due to increased tremor of hands and feet.  She was treated with supplemental oxygen  initially at 4 L/min, given prednisone, oral  Levaquin.  She was weaned from supplemental oxygen , given oral prednisone taper  and discharged home on Jan 19 With home health PT recommended .  Patient states that she was not given antibiotics at discharge and there was no mention of abx on DC summary. (Hosp 1/16 to 1/19).  She was  advised to see  her PCP   She finished the steroid taper on Jan 20  . Had  some chills yesterday and a prolonged coughing spell this morning which produced dark green sputum. . Most of the time the cough has been nonproductive and aggravated by supine position.  She continues to report lack of  energy,  and is feeling sleepy a lot during the day.  Shortness of breath is chronic.   She feels generally weak , but denies anorexia and fevers/   Outpatient Medications Prior to Visit   Medication Sig Dispense Refill   acetaminophen (TYLENOL) 650 MG CR tablet Take 650 mg by mouth every 8 (eight) hours as needed for pain.     albuterol (VENTOLIN HFA) 108 (90 Base) MCG/ACT inhaler Inhale 2 puffs into the lungs every 6 (six) hours as needed for wheezing or shortness of breath.     amiodarone (PACERONE) 200 MG tablet TAKE 1 TABLET BY MOUTH DAILY 30 tablet 2   azelastine (ASTELIN) 0.1 % nasal spray Place 1 spray into both nostrils 2 (two) times daily. Use in each nostril as directed 30 mL 0   cholecalciferol (VITAMIN D3) 25 MCG (1000 UNIT) tablet Take 2,000 Units by mouth daily.     DULoxetine (CYMBALTA) 60 MG capsule TAKE ONE CAPSULE AT BEDTIME 90 capsule 1   ELIQUIS 5 MG TABS tablet TAKE ONE TABLET BY MOUTH TWICE DAILY 180 tablet 1   gabapentin (NEURONTIN) 300 MG capsule TAKE 2 CAPSULES BY MOUTH AT BEDTIME 180 capsule 1   levothyroxine (SYNTHROID) 100 MCG tablet TAKE 1 TABLET EVERY DAY ON EMPTY STOMACHWITH A GLASS OF WATER AT LEAST 30-60 MINBEFORE BREAKFAST 90 tablet 1   lovastatin (MEVACOR) 40 MG tablet TAKE 1 TABLET BY MOUTH DAILY 90 tablet 3   metoprolol tartrate 75 MG TABS Take 1 tablet (75 mg total) by mouth 2 (two) times daily.  Multiple Vitamins-Minerals (PRESERVISION AREDS 2+MULTI VIT PO) Take 1 tablet by mouth 2 (two) times daily.     omeprazole (PRILOSEC) 20 MG capsule TAKE 1 CAPSULE BY MOUTH ONCE DAILY 90 capsule 3   Polyethyl Glycol-Propyl Glycol (SYSTANE OP) Place 1 drop into both eyes daily as needed (for dry eyes).     polyethylene glycol powder (GLYCOLAX/MIRALAX) 17 GM/SCOOP powder Take 17 g by mouth daily. 850 g 1   Probiotic Product (ALIGN) 4 MG CAPS One capsule daily while on antibiotic and for two weeks after complete antibiotic 30 capsule 0   triamcinolone cream (KENALOG) 0.1 % Apply 1 application. topically 2 (two) times daily. 30 g 0   vitamin B-12 (CYANOCOBALAMIN) 1000 MCG tablet Take 1,000 mcg by mouth daily.     White Petrolatum-Mineral Oil (GENTEAL  TEARS NIGHT-TIME OP) Place 1 application into both eyes at bedtime. Night time ointment 3.5g     No facility-administered medications prior to visit.    Review of Systems;  Patient denies headache, fevers, , unintentional weight loss, skin rash, eye pain, sinus congestion and sinus pain, sore throat, dysphagia,  hemoptysis , , dyspnea, wheezing, chest pain, palpitations, orthopnea, edema, abdominal pain, nausea, melena, diarrhea, constipation, flank pain, dysuria, hematuria, urinary  Frequency, nocturia, numbness, tingling, seizures,  Focal weakness, Loss of consciousness,  Tremor, insomnia, depression, anxiety, and suicidal ideation.      Objective:  BP 132/80   Pulse 82   Temp 97.8 F (36.6 C) (Oral)   Ht '5\' 5"'$  (1.651 m)   Wt 170 lb 12.8 oz (77.5 kg)   SpO2 98%   BMI 28.42 kg/m   BP Readings from Last 3 Encounters:  10/20/22 132/80  10/14/22 110/60  08/30/22 126/80    Wt Readings from Last 3 Encounters:  10/20/22 170 lb 12.8 oz (77.5 kg)  10/11/22 170 lb 13.7 oz (77.5 kg)  10/11/22 168 lb (76.2 kg)    Physical Exam Vitals reviewed.  Constitutional:      General: She is not in acute distress.    Appearance: Normal appearance. She is normal weight. She is not ill-appearing, toxic-appearing or diaphoretic.  HENT:     Head: Normocephalic.  Eyes:     General: No scleral icterus.       Right eye: No discharge.        Left eye: No discharge.     Conjunctiva/sclera: Conjunctivae normal.  Cardiovascular:     Rate and Rhythm: Normal rate and regular rhythm.     Heart sounds: Normal heart sounds.  Pulmonary:     Effort: Pulmonary effort is normal. No respiratory distress.     Breath sounds: Normal breath sounds.  Musculoskeletal:        General: Normal range of motion.  Skin:    General: Skin is warm and dry.  Neurological:     General: No focal deficit present.     Mental Status: She is alert and oriented to person, place, and time. Mental status is at baseline.   Psychiatric:        Mood and Affect: Mood normal.        Behavior: Behavior normal.        Thought Content: Thought content normal.        Judgment: Judgment normal.     Lab Results  Component Value Date   HGBA1C 6.6 (H) 08/25/2022   HGBA1C 6.4 03/18/2022   HGBA1C 6.4 11/29/2021    Lab Results  Component Value Date   CREATININE 0.98  10/20/2022   CREATININE 0.73 10/12/2022   CREATININE 0.91 10/11/2022    Lab Results  Component Value Date   WBC 9.9 10/20/2022   HGB 13.6 10/20/2022   HCT 41.4 10/20/2022   PLT 370.0 10/20/2022   GLUCOSE 87 10/20/2022   CHOL 167 08/25/2022   TRIG 181.0 (H) 08/25/2022   HDL 41.60 08/25/2022   LDLDIRECT 90.0 03/15/2021   LDLCALC 89 08/25/2022   ALT 22 10/11/2022   AST 30 10/11/2022   NA 141 10/20/2022   K 4.2 10/20/2022   CL 102 10/20/2022   CREATININE 0.98 10/20/2022   BUN 14 10/20/2022   CO2 30 10/20/2022   TSH 1.39 05/12/2021   INR 1.5 (H) 11/21/2018   HGBA1C 6.6 (H) 08/25/2022    CT Angio Chest PE W and/or Wo Contrast  Result Date: 10/11/2022 CLINICAL DATA:  Pulmonary embolism suspected, high probability. Flu-like symptoms shortness of breath. EXAM: CT ANGIOGRAPHY CHEST WITH CONTRAST TECHNIQUE: Multidetector CT imaging of the chest was performed using the standard protocol during bolus administration of intravenous contrast. Multiplanar CT image reconstructions and MIPs were obtained to evaluate the vascular anatomy. RADIATION DOSE REDUCTION: This exam was performed according to the departmental dose-optimization program which includes automated exposure control, adjustment of the mA and/or kV according to patient size and/or use of iterative reconstruction technique. CONTRAST:  64m OMNIPAQUE IOHEXOL 350 MG/ML SOLN COMPARISON:  Chest CT dated 05/05/2022. FINDINGS: Cardiovascular: Some of the most peripheral segmental and subsegmental pulmonary artery branches are difficult to definitively characterize due to patient breathing motion  artifact, however, there is no pulmonary embolism identified within the main, lobar or segmental pulmonary arteries bilaterally. No thoracic aortic aneurysm. Scattered aortic atherosclerosis. No pericardial effusion. Scattered coronary artery calcifications. Mediastinum/Nodes: No mass or enlarged lymph nodes are seen within the mediastinum or perihilar regions. Esophagus is unremarkable. Small hiatal hernia within the lower mediastinum. Trachea and central bronchi are unremarkable. Lungs/Pleura: Patchy ground-glass opacities throughout both lungs. No pleural effusion or pneumothorax. Upper Abdomen: Limited images of the upper abdomen are unremarkable. Musculoskeletal: Degenerative spondylosis of the slightly kyphotic thoracic spine. No acute-appearing osseous abnormality. Review of the MIP images confirms the above findings. IMPRESSION: 1. No pulmonary embolism identified, with mild study limitations detailed above. 2. Patchy ground-glass opacities throughout both lungs. Differential includes atypical/viral pneumonias, interstitial pneumonias, edema related to volume overload/CHF, hypersensitivity pneumonitis, and respiratory bronchiolitis. COVID pneumonia can have this appearance. 3. Small hiatal hernia. 4. Coronary artery calcifications. Electronically Signed   By: SFranki CabotM.D.   On: 10/11/2022 13:13   DG Chest 2 View  Result Date: 10/11/2022 CLINICAL DATA:  Provided history: Shortness of breath. Additional history provided: Flu like symptoms. EXAM: CHEST - 2 VIEW COMPARISON:  Prior chest radiographs 08/30/2022 and earlier. FINDINGS: Left chest multilead implantable cardiac device. Borderline cardiomegaly, unchanged. Aortic atherosclerosis. No appreciable airspace consolidation or pulmonary edema. No evidence of pleural effusion or pneumothorax. Thoracic spondylosis. IMPRESSION: No evidence of acute cardiopulmonary abnormality. Borderline cardiomegaly, unchanged. Aortic Atherosclerosis (ICD10-I70.0).  Electronically Signed   By: KKellie SimmeringD.O.   On: 10/11/2022 11:54    Assessment & Plan:  .Essential hypertension -     Basic metabolic panel  Anemia, unspecified type -     CBC with Differential/Platelet -     B12 and Folate Panel -     IBC + Ferritin  Pneumonia and influenza Assessment & Plan: Suggested by ground glass opacities noted bilateral on Jan 16 admission chest CT in the setting of hypoxic respiratory  failure and flu.  She continues  to feel poorly and produced purulent sputum after discharge  on JAN 19 without antibiotics.  I am adding doxycycline given patient's current state and incomplete antibiotic administration during hospitalization. Daily use of a probiotic advised for 3 weeks.     Hospital discharge follow-up Assessment & Plan: Patient is unchanged  post discharge  from [facility] on Jan 19.  She was not given abx at discharge but had received 3 days of levaquin during hospitalization.  All labs , imaging studies and progress notes from admission were reviewed with patient today  . Antibiotics to be resumed today with doxycycline for empiric MRSA coverage .  Follow up in 2 weeks    Other orders -     Doxycycline Hyclate; Take 1 tablet (100 mg total) by mouth 2 (two) times daily.  Dispense: 14 tablet; Refill: 0 -     Benzonatate; Take 1 capsule (200 mg total) by mouth 3 (three) times daily as needed for cough.  Dispense: 60 capsule; Refill: 1     I provided 30 minutes of face-to-face time during this encounter reviewing patient's last visit with me, patient's  most recent visit with cardiology,  nephrology,  and neurology,  recent surgical and non surgical procedures, previous  labs and imaging studies, counseling on currently addressed issues,  and post visit ordering to diagnostics and therapeutics .   Follow-up: Return in about 2 weeks (around 11/03/2022) for pneumonia.   Crecencio Mc, MD

## 2022-10-22 ENCOUNTER — Encounter: Payer: Self-pay | Admitting: Internal Medicine

## 2022-10-22 DIAGNOSIS — Z09 Encounter for follow-up examination after completed treatment for conditions other than malignant neoplasm: Secondary | ICD-10-CM | POA: Insufficient documentation

## 2022-10-22 DIAGNOSIS — J1100 Influenza due to unidentified influenza virus with unspecified type of pneumonia: Secondary | ICD-10-CM | POA: Insufficient documentation

## 2022-10-22 NOTE — Assessment & Plan Note (Addendum)
Patient is unchanged  post discharge  from [facility] on Jan 19.  She was not given abx at discharge but had received 3 days of levaquin during hospitalization.  All labs , imaging studies and progress notes from admission were reviewed with patient today  . Antibiotics to be resumed today with doxycycline for empiric MRSA coverage .  Follow up in 2 weeks

## 2022-10-22 NOTE — Assessment & Plan Note (Signed)
Suggested by ground glass opacities noted bilateral on Jan 16 admission chest CT in the setting of hypoxic respiratory failure and flu.  She continues  to feel poorly and produced purulent sputum after discharge  on JAN 19 without antibiotics.  I am adding doxycycline given patient's current state and incomplete antibiotic administration during hospitalization. Daily use of a probiotic advised for 3 weeks.

## 2022-10-26 DIAGNOSIS — H353122 Nonexudative age-related macular degeneration, left eye, intermediate dry stage: Secondary | ICD-10-CM | POA: Diagnosis not present

## 2022-10-26 DIAGNOSIS — H353221 Exudative age-related macular degeneration, left eye, with active choroidal neovascularization: Secondary | ICD-10-CM | POA: Diagnosis not present

## 2022-10-26 DIAGNOSIS — Z961 Presence of intraocular lens: Secondary | ICD-10-CM | POA: Diagnosis not present

## 2022-10-26 DIAGNOSIS — H43813 Vitreous degeneration, bilateral: Secondary | ICD-10-CM | POA: Diagnosis not present

## 2022-11-03 ENCOUNTER — Ambulatory Visit: Payer: PPO | Admitting: Internal Medicine

## 2022-11-08 ENCOUNTER — Ambulatory Visit (INDEPENDENT_AMBULATORY_CARE_PROVIDER_SITE_OTHER): Payer: PPO

## 2022-11-08 ENCOUNTER — Encounter: Payer: Self-pay | Admitting: Internal Medicine

## 2022-11-08 ENCOUNTER — Telehealth: Payer: Self-pay | Admitting: Internal Medicine

## 2022-11-08 ENCOUNTER — Ambulatory Visit (INDEPENDENT_AMBULATORY_CARE_PROVIDER_SITE_OTHER): Payer: PPO | Admitting: Internal Medicine

## 2022-11-08 VITALS — BP 126/74 | HR 84 | Temp 98.1°F | Resp 16 | Ht 65.0 in | Wt 170.4 lb

## 2022-11-08 DIAGNOSIS — F419 Anxiety disorder, unspecified: Secondary | ICD-10-CM

## 2022-11-08 DIAGNOSIS — K219 Gastro-esophageal reflux disease without esophagitis: Secondary | ICD-10-CM

## 2022-11-08 DIAGNOSIS — I700 Atherosclerosis of aorta: Secondary | ICD-10-CM | POA: Diagnosis not present

## 2022-11-08 DIAGNOSIS — D649 Anemia, unspecified: Secondary | ICD-10-CM

## 2022-11-08 DIAGNOSIS — G4733 Obstructive sleep apnea (adult) (pediatric): Secondary | ICD-10-CM

## 2022-11-08 DIAGNOSIS — F439 Reaction to severe stress, unspecified: Secondary | ICD-10-CM

## 2022-11-08 DIAGNOSIS — I4891 Unspecified atrial fibrillation: Secondary | ICD-10-CM | POA: Diagnosis not present

## 2022-11-08 DIAGNOSIS — J411 Mucopurulent chronic bronchitis: Secondary | ICD-10-CM

## 2022-11-08 DIAGNOSIS — J479 Bronchiectasis, uncomplicated: Secondary | ICD-10-CM | POA: Diagnosis not present

## 2022-11-08 DIAGNOSIS — R531 Weakness: Secondary | ICD-10-CM

## 2022-11-08 DIAGNOSIS — E7800 Pure hypercholesterolemia, unspecified: Secondary | ICD-10-CM | POA: Diagnosis not present

## 2022-11-08 DIAGNOSIS — E039 Hypothyroidism, unspecified: Secondary | ICD-10-CM

## 2022-11-08 DIAGNOSIS — R739 Hyperglycemia, unspecified: Secondary | ICD-10-CM

## 2022-11-08 DIAGNOSIS — I495 Sick sinus syndrome: Secondary | ICD-10-CM

## 2022-11-08 DIAGNOSIS — R0602 Shortness of breath: Secondary | ICD-10-CM | POA: Diagnosis not present

## 2022-11-08 DIAGNOSIS — I10 Essential (primary) hypertension: Secondary | ICD-10-CM

## 2022-11-08 DIAGNOSIS — Z85850 Personal history of malignant neoplasm of thyroid: Secondary | ICD-10-CM

## 2022-11-08 DIAGNOSIS — R251 Tremor, unspecified: Secondary | ICD-10-CM

## 2022-11-08 DIAGNOSIS — Z853 Personal history of malignant neoplasm of breast: Secondary | ICD-10-CM

## 2022-11-08 LAB — CBC WITH DIFFERENTIAL/PLATELET
Basophils Absolute: 0.1 10*3/uL (ref 0.0–0.1)
Basophils Relative: 0.9 % (ref 0.0–3.0)
Eosinophils Absolute: 0.2 10*3/uL (ref 0.0–0.7)
Eosinophils Relative: 3.5 % (ref 0.0–5.0)
HCT: 41.9 % (ref 36.0–46.0)
Hemoglobin: 13.8 g/dL (ref 12.0–15.0)
Lymphocytes Relative: 20.6 % (ref 12.0–46.0)
Lymphs Abs: 1.3 10*3/uL (ref 0.7–4.0)
MCHC: 33 g/dL (ref 30.0–36.0)
MCV: 94.7 fl (ref 78.0–100.0)
Monocytes Absolute: 0.7 10*3/uL (ref 0.1–1.0)
Monocytes Relative: 10.6 % (ref 3.0–12.0)
Neutro Abs: 4.1 10*3/uL (ref 1.4–7.7)
Neutrophils Relative %: 64.4 % (ref 43.0–77.0)
Platelets: 232 10*3/uL (ref 150.0–400.0)
RBC: 4.43 Mil/uL (ref 3.87–5.11)
RDW: 14 % (ref 11.5–15.5)
WBC: 6.3 10*3/uL (ref 4.0–10.5)

## 2022-11-08 LAB — HEPATIC FUNCTION PANEL
ALT: 12 U/L (ref 0–35)
AST: 15 U/L (ref 0–37)
Albumin: 3.9 g/dL (ref 3.5–5.2)
Alkaline Phosphatase: 123 U/L — ABNORMAL HIGH (ref 39–117)
Bilirubin, Direct: 0.1 mg/dL (ref 0.0–0.3)
Total Bilirubin: 0.7 mg/dL (ref 0.2–1.2)
Total Protein: 6.5 g/dL (ref 6.0–8.3)

## 2022-11-08 LAB — BASIC METABOLIC PANEL
BUN: 14 mg/dL (ref 6–23)
CO2: 32 mEq/L (ref 19–32)
Calcium: 10 mg/dL (ref 8.4–10.5)
Chloride: 101 mEq/L (ref 96–112)
Creatinine, Ser: 1.03 mg/dL (ref 0.40–1.20)
GFR: 48.25 mL/min — ABNORMAL LOW (ref >60.00)
Glucose, Bld: 108 mg/dL — ABNORMAL HIGH (ref 70–99)
Potassium: 4.5 mEq/L (ref 3.5–5.1)
Sodium: 139 mEq/L (ref 135–145)

## 2022-11-08 LAB — IRON: Iron: 112 ug/dL (ref 42–145)

## 2022-11-08 LAB — TSH: TSH: 9.94 u[IU]/mL — ABNORMAL HIGH (ref 0.35–5.50)

## 2022-11-08 LAB — FERRITIN: Ferritin: 40.2 ng/mL (ref 10.0–291.0)

## 2022-11-08 MED ORDER — LEVALBUTEROL TARTRATE 45 MCG/ACT IN AERO: 2.0000 | INHALATION_SPRAY | Freq: Four times a day (QID) | RESPIRATORY_TRACT | 2 refills | Status: DC | PRN

## 2022-11-08 NOTE — Telephone Encounter (Signed)
The pharmacy called stating the pt albuterol Inhaler is on back order and pharmacy does not know if the provider wants to change it

## 2022-11-08 NOTE — Progress Notes (Unsigned)
Subjective:    Patient ID: Kerri Mills, female    DOB: 1931-04-08, 89 y.o.   MRN: [id number]  Patient here for  Chief Complaint  Patient presents with   Medical Management of Chronic Issues    HPI Admitted 10/11/22 - 10/14/22 - with increased cough.  Initially required 2L oxygen.  Tested positive for influenza . Treated with IV levaquin and IV solumedrol and scheduled duonebs.  Weaned to room air.  Discharged with tapering steroids. Saw Dr Derrel Nip 10/20/22 - hospital follow up. Added doxycycline.  She reports no increased cough.  Still with sob with exertion.  Not able to walk as far without sitting to rest.  Noticed this prior to her hospitalization as well.  Intermittent increased heart rate.  States pulse varies - 50-100s.  On 34m metoprolol bid.  Has been monitoring oxygen. States is dropping at night when sleeping.  95% today after walking in.  Increased tremors.  No vomiting.  Eating.  No bowel change reported.    Past Medical History:  Diagnosis Date   Allergy    Anxiety    Arthritis    Atypical chest pain    a. 10/2018 MV: small, fixed apical defect possibly 2/2 attenuation artifact. No ischemia.  EF 68%.   Cl[place]ting disorder [place]    Colitis    Depression    Diverticulitis 2013   Dysrhythmia    Gastric ulcer    GERD (gastroesophageal reflux disease)    History of echocardiogram    a. 09/2019 Echo: EF 55-60%, mod LVH. Mildly dil LA. Triv MR/TR.   Hypercholesterolemia    Hypertension    Hypothyroidism    Infiltrating lobular carcinoma of left breast 2011   T2,N0, ER: 90%; PR 0%; Her 2 neu not amplified. ([facility].   [place]   Melanoma in situ of upper extremity ([place] 03/19/2011   Persistent atrial fibrillation ([place]    a. CHADS2VASc => 4 (HTN, age x 2, female)   Personal history of radiation therapy 2011   BREAST CA   Presence of permanent cardiac pacemaker    Seroma    HISTORY OF LFT BREAST   Sleep apnea    Thyroid cancer ([place]   Past  Surgical History:  Procedure Laterality Date   ABDOMINAL HYSTERECTOMY  1973   partial   BREAST BIOPSY Left 02-13-13   BENIGN BREAST TISSUE WITH CHANGES CONSISTENT WITH FAT NECROSIS   BREAST BIOPSY Left 01/21/2015   bx done in [facility] office 11:00 left 6-8cmfn   BREAST EXCISIONAL BIOPSY Left 1995   neg   BREAST EXCISIONAL BIOPSY Left 2011   Breast cancer radiation   BREAST LUMPECTOMY Left 2011   BREAST CA   CARDIAC CATHETERIZATION     CHOLECYSTECTOMY     COLONOSCOPY  2013   COLONOSCOPY WITH PROPOFOL N/A 06/09/2021   Procedure: COLONOSCOPY WITH PROPOFOL;  Surgeon: AJonathon Bellows MD;  [facility];  Service: Gastroenterology;  Laterality: N/A;   HAMMER TOE SURGERY Bilateral 02/24/2022   Procedure: HAMMER TOE CORRECTION 2, 3, 4 Right and 2nd Left;  Surgeon: EEdrick Kins DPM;  Location: [facility];  Service: Podiatry;  Laterality: Bilateral;   MELANOMA EXCISION     RT UPPER ARM   PACEMAKER IMPLANT N/A 01/10/2020   Procedure: PACEMAKER IMPLANT;  Surgeon: CConstance Haw MD;  [facility];  Service: Cardiovascular;  Laterality: N/A;   PARTIAL HYSTERECTOMY     bleeding, ovaries in place.  THYROID SURGERY  1992   FOR THYROID CANCER   TONSILLECTOMY     Family History  Problem Relation Age of Onset   Heart disease Mother    Cancer Brother        lung    Cancer Sister        breast   Breast cancer Neg Hx    Social History   Socioeconomic History   Marital status: Widowed    Spouse name: Not on file   Number of children: Not on file   Years of education: Not on file   Highest education level: Not on file  Occupational History   Not on file  Tobacco Use   Smoking status: Never   Smokeless tobacco: Never   Tobacco comments:    never  Vaping Use   Vaping Use: Never used  Substance and Sexual Activity   Alcohol use: Yes    Comment: once in a while   Drug use: No   Sexual activity: Never  Other Topics Concern   Not on file  Social History  Narrative   Independent and baseline. Lives by herself   Social Determinants of Health   Financial Resource Strain: Low Risk  (02/08/2022)   Overall Financial Resource Strain (CARDIA)    Difficulty of Paying Living Expenses: Not hard at all  Food Insecurity: No Food Insecurity (10/17/2022)   Hunger Vital Sign    Worried About Running Out of Food in the Last Year: Never true    Ran Out of Food in the Last Year: Never true  Transportation Needs: No Transportation Needs (10/17/2022)   PRAPARE - Hydrologist (Medical): No    Lack of Transportation (Non-Medical): No  Physical Activity: Sufficiently Active (02/08/2022)   Exercise Vital Sign    Days of Exercise per Week: 4 days    Minutes of Exercise per Session: 40 min  Stress: No Stress Concern Present (02/08/2022)   [place]    Feeling of Stress : Only a little  Social Connections: Unknown (02/08/2022)   Social Connection and Isolation Panel [NHANES]    Frequency of Communication with Friends and Family: More than three times a week    Frequency of Social Gatherings with Friends and Family: More than three times a week    Attends Religious Services: Not on file    Active Member of Clubs or Organizations: Not on file    Attends Archivist Meetings: Not on file    Marital Status: Widowed     Review of Systems  Constitutional:  Negative for appetite change and unexpected weight change.  HENT:  Negative for congestion and sinus pressure.   Respiratory:  Positive for shortness of breath. Negative for cough and chest tightness.   Cardiovascular:  Negative for chest pain.       No increased swelling.  Increased heart rate intermittent as outlined.    Gastrointestinal:  Negative for abdominal pain, diarrhea, nausea and vomiting.  Genitourinary:  Negative for difficulty urinating and dysuria.  Musculoskeletal:  Negative for joint swelling  and myalgias.  Skin:  Negative for color change and rash.  Neurological:  Positive for tremors. Negative for dizziness and headaches.  Psychiatric/Behavioral:  Negative for agitation and dysphoric mood.        Objective:     BP 126/74   Pulse 84   Temp 98.1 F (36.7 C)   Resp 16   Ht 5'  5" (1.651 m)   Wt 170 lb 6.4 oz (77.3 kg)   SpO2 95%   BMI 28.36 kg/m  Wt Readings from Last 3 Encounters:  11/08/22 170 lb 6.4 oz (77.3 kg)  10/20/22 170 lb 12.8 oz (77.5 kg)  10/11/22 170 lb 13.7 oz (77.5 kg)    Physical Exam Vitals reviewed.  Constitutional:      General: She is not in acute distress.    Appearance: Normal appearance.  HENT:     Head: Normocephalic and atraumatic.     Right Ear: External ear normal.     Left Ear: External ear normal.     Mouth/Throat:     Pharynx: No oropharyngeal exudate or posterior oropharyngeal erythema.  Eyes:     General: No scleral icterus.       Right eye: No discharge.        Left eye: No discharge.     Conjunctiva/sclera: Conjunctivae normal.  Neck:     Thyroid: No thyromegaly.  Cardiovascular:     Rate and Rhythm: Normal rate and regular rhythm.  Pulmonary:     Effort: No respiratory distress.     Comments: No increased wheezing.  Abdominal:     General: Bowel sounds are normal.     Palpations: Abdomen is soft.     Tenderness: There is no abdominal tenderness.  Musculoskeletal:        General: No swelling or tenderness.     Cervical back: Neck supple. No tenderness.  Lymphadenopathy:     Cervical: No cervical adenopathy.  Skin:    Findings: No erythema or rash.  Neurological:     Mental Status: She is alert.  Psychiatric:        Mood and Affect: Mood normal.        Behavior: Behavior normal.      Outpatient Encounter Medications as of 11/08/2022  Medication Sig   [DISCONTINUED] levalbuterol (XOPENEX HFA) 45 MCG/ACT inhaler Inhale 2 puffs into the lungs every 6 (six) hours as needed for wheezing.   acetaminophen  (TYLENOL) 650 MG CR tablet Take 650 mg by mouth every 8 (eight) hours as needed for pain.   amiodarone (PACERONE) 200 MG tablet TAKE 1 TABLET BY MOUTH DAILY   azelastine (ASTELIN) 0.1 % nasal spray Place 1 spray into both nostrils 2 (two) times daily. Use in each nostril as directed   cholecalciferol (VITAMIN D3) 25 MCG (1000 UNIT) tablet Take 2,000 Units by mouth daily.   DULoxetine (CYMBALTA) 60 MG capsule TAKE ONE CAPSULE AT BEDTIME   ELIQUIS 5 MG TABS tablet TAKE ONE TABLET BY MOUTH TWICE DAILY   gabapentin (NEURONTIN) 300 MG capsule TAKE 2 CAPSULES BY MOUTH AT BEDTIME   levothyroxine (SYNTHROID) 100 MCG tablet TAKE 1 TABLET EVERY DAY ON EMPTY STOMACHWITH A GLASS OF WATER AT LEAST 30-60 MINBEFORE BREAKFAST   lovastatin (MEVACOR) 40 MG tablet TAKE 1 TABLET BY MOUTH DAILY   metoprolol tartrate 75 MG TABS Take 1 tablet (75 mg total) by mouth 2 (two) times daily.   Multiple Vitamins-Minerals (PRESERVISION AREDS 2+MULTI VIT PO) Take 1 tablet by mouth 2 (two) times daily.   omeprazole (PRILOSEC) 20 MG capsule TAKE 1 CAPSULE BY MOUTH ONCE DAILY   Polyethyl Glycol-Propyl Glycol (SYSTANE OP) Place 1 drop into both eyes daily as needed (for dry eyes).   polyethylene glycol powder (GLYCOLAX/MIRALAX) 17 GM/SCOOP powder Take 17 g by mouth daily.   Probiotic Product (ALIGN) 4 MG CAPS One capsule daily while on antibiotic and  for two weeks after complete antibiotic   triamcinolone cream (KENALOG) 0.1 % Apply 1 application. topically 2 (two) times daily.   vitamin B-12 (CYANOCOBALAMIN) 1000 MCG tablet Take 1,000 mcg by mouth daily.   White Petrolatum-Mineral Oil (GENTEAL TEARS NIGHT-TIME OP) Place 1 application into both eyes at bedtime. Night time ointment 3.5g   [DISCONTINUED] albuterol (VENTOLIN HFA) 108 (90 Base) MCG/ACT inhaler Inhale 2 puffs into the lungs every 6 (six) hours as needed for wheezing or shortness of breath.   [DISCONTINUED] benzonatate (TESSALON) 200 MG capsule Take 1 capsule (200 mg  total) by mouth 3 (three) times daily as needed for cough.   [DISCONTINUED] doxycycline (VIBRA-TABS) 100 MG tablet Take 1 tablet (100 mg total) by mouth 2 (two) times daily.   No facility-administered encounter medications on file as of 11/08/2022.     Lab Results  Component Value Date   WBC 6.3 11/08/2022   HGB 13.8 11/08/2022   HCT 41.9 11/08/2022   PLT 232.0 11/08/2022   GLUCOSE 108 (H) 11/08/2022   CHOL 167 08/25/2022   TRIG 181.0 (H) 08/25/2022   HDL 41.60 08/25/2022   LDLDIRECT 90.0 03/15/2021   LDLCALC 89 08/25/2022   ALT 12 11/08/2022   AST 15 11/08/2022   NA 139 11/08/2022   K 4.5 11/08/2022   CL 101 11/08/2022   CREATININE 1.03 11/08/2022   BUN 14 11/08/2022   CO2 32 11/08/2022   TSH 9.94 (H) 11/08/2022   INR 1.5 (H) 11/21/2018   HGBA1C 6.6 (H) 08/25/2022    CT Angio Chest PE W and/or Wo Contrast  Result Date: 10/11/2022 CLINICAL DATA:  Pulmonary embolism suspected, high probability. Flu-like symptoms shortness of breath. EXAM: CT ANGIOGRAPHY CHEST WITH CONTRAST TECHNIQUE: Multidetector CT imaging of the chest was performed using the standard protocol during bolus administration of intravenous contrast. Multiplanar CT image reconstructions and MIPs were obtained to evaluate the vascular anatomy. RADIATION DOSE REDUCTION: This exam was performed according to the departmental dose-optimization program which includes automated exposure control, adjustment of the mA and/or kV according to patient size and/or use of iterative reconstruction technique. CONTRAST:  5m OMNIPAQUE IOHEXOL 350 MG/ML SOLN COMPARISON:  Chest CT dated 05/05/2022. FINDINGS: Cardiovascular: Some of the most peripheral segmental and subsegmental pulmonary artery branches are difficult to definitively characterize due to patient breathing motion artifact, however, there is no pulmonary embolism identified within the main, lobar or segmental pulmonary arteries bilaterally. No thoracic aortic aneurysm.  Scattered aortic atherosclerosis. No pericardial effusion. Scattered coronary artery calcifications. Mediastinum/Nodes: No mass or enlarged lymph nodes are seen within the mediastinum or perihilar regions. Esophagus is unremarkable. Small hiatal hernia within the lower mediastinum. Trachea and central bronchi are unremarkable. Lungs/Pleura: Patchy ground-glass opacities throughout both lungs. No pleural effusion or pneumothorax. Upper Abdomen: Limited images of the upper abdomen are unremarkable. Musculoskeletal: Degenerative spondylosis of the slightly kyphotic thoracic spine. No acute-appearing osseous abnormality. Review of the MIP images confirms the above findings. IMPRESSION: 1. No pulmonary embolism identified, with mild study limitations detailed above. 2. Patchy ground-glass opacities throughout both lungs. Differential includes atypical/viral pneumonias, interstitial pneumonias, edema related to volume overload/CHF, hypersensitivity pneumonitis, and respiratory bronchiolitis. COVID pneumonia can have this appearance. 3. Small hiatal hernia. 4. Coronary artery calcifications. Electronically Signed   By: SFranki CabotM.D.   On: 10/11/2022 13:13   DG Chest 2 View  Result Date: 10/11/2022 CLINICAL DATA:  Provided history: Shortness of breath. Additional history provided: Flu like symptoms. EXAM: CHEST - 2 VIEW COMPARISON:  Prior  chest radiographs 08/30/2022 and earlier. FINDINGS: Left chest multilead implantable cardiac device. Borderline cardiomegaly, unchanged. Aortic atherosclerosis. No appreciable airspace consolidation or pulmonary edema. No evidence of pleural effusion or pneumothorax. Thoracic spondylosis. IMPRESSION: No evidence of acute cardiopulmonary abnormality. Borderline cardiomegaly, unchanged. Aortic Atherosclerosis (ICD10-I70.0). Electronically Signed   By: Kellie Simmering D.O.   On: 10/11/2022 11:54       Assessment & Plan:  SOB (shortness of breath) Assessment & Plan: Persistent  increased sob with exertion.  Recent admission - pneumonia and flu.  Treated.  Will plan f/u with pulmonary as outlined.  Discussed overnight oximetry.  Rescue inhaler if needed.  CPAP.  Will need f/u scan/xray.  Question - f/u PFTs.  EKG - paced rhythm with BBB.  With varying heart rates.  Discussed further cardiology w/up.  Underlying lung issues affecting.  Continue metoprolol.  F/u with Dr Rockey Situ.   Orders: -     EKG 12-Lead -     DG Chest 2 View; Future -     CBC with Differential/Platelet  Hypercholesterolemia Assessment & Plan: On lovastatin.  Continue diet and exercise.  Follow fasting profile and liver panel.   Orders: -     Hepatic function panel  Hyperglycemia Assessment & Plan: Low carb diet and exercise.  Follow met b and a1c.     Hypothyroidism, unspecified type Assessment & Plan: On thyroid replacement.  Follow tsh.   Orders: -     TSH  Essential hypertension Assessment & Plan: On metoprolol and lasix prn.   Blood pressure as outlined.  Continue current medication regimen. Hold on making changes.   Follow pressures.  Follow metabolic panel.   Orders: -     Basic metabolic panel  Iron overload -     Iron -     Ferritin  Anemia, unspecified type Assessment & Plan: Follow cbc.    Anxiety Assessment & Plan: Continues on cymbalta.    Aortic atherosclerosis ([place]) Assessment & Plan: On lovastatin.    Atrial fibrillation, unspecified type [facility]) Assessment & Plan: S/p pacemaker placement.  On eliquis and metoprolol.  Continues amiodarone. With varying heart rate - increased at times.  Continue metoprolol 90m bid.  F/u with cardiology as outlined.    Bronchiectasis without complication ([facility] Assessment & Plan: Saw Dr KMortimer Frieswith recommendations: Bronchiectasis without complication ([place]. Continue flutter valve as tolerated. Repeat CT chest in 3 months Restart Flutter valve Three times a day  .  Mucinex Twice daily  As needed  cough/congestion  Albuterol  inhaler 1-2 puffs every 6hr as needed for wheezing /shortness of breath.  With recent hospitalization as outlined.  Will prescribe xopenex - may not aggravate tremors as much.  Has tolerated albuterol previously.  CT scan in hospital -  Patchy ground-glass opacities throughout both lungs. Differential includes atypical/viral pneumonias, interstitial pneumonias, edema related to volume overload/CHF, hypersensitivity pneumonitis, and respiratory bronchiolitis. COVID pneumonia can have this appearance.  Discussed the need for f/u with pulmonary.  Also need to see if can arrange overnight oximetry.  Will need f/u xray/scan.    Gastroesophageal reflux disease without esophagitis Assessment & Plan: Upper symptoms appear to be controlled on omeprazole.    History of breast cancer Assessment & Plan: 01/10/22 - Birads I.  Evaluated by Dr BBary Castilla4/27/23 - recommended yearly f/u - Dr CWindell Moment    History of thyroid cancer Assessment & Plan: On thyroid replacement.  Follow tsh.    Mucopurulent chronic bronchitis ([place] Assessment & Plan: Has seen pulmonary. With  recent admission - influenza with pneumonia.  Treated.  Still with increased sob with exertion. Plan f/u with cardiology as outlined.  Also, rescue inhaler if needed.  Recheck cxr.  Hold prednisone.  Discussed f/u with Dr Mortimer Fries - f/u scan and PFTs.     Obstructive sleep apnea Assessment & Plan: Continue cpap. Discussed using regularly.     Stress Assessment & Plan: Continue on cymbalta.  Follow    Tachy-brady syndrome [facility]) Assessment & Plan: Seeing Dr Rockey Situ and EP.  On metoprolol 43m bid.  Further w/up as outlined.  Question of need for f/u monitor/echo - reassess heart function.     Tremor Assessment & Plan: Has been felt to be benign essential tremor.  Has seen neurology.  Increased tremor with above issues.  F/u with neurology.    Weakness Assessment & Plan: With recent admission as outlined.  CArchitectural technologist- home  health - PT to evaluate and treat.       CEinar Pheasant MD

## 2022-11-09 ENCOUNTER — Encounter: Payer: Self-pay | Admitting: Internal Medicine

## 2022-11-09 DIAGNOSIS — R531 Weakness: Secondary | ICD-10-CM | POA: Insufficient documentation

## 2022-11-09 MED ORDER — ALBUTEROL SULFATE HFA 108 (90 BASE) MCG/ACT IN AERS: 2.0000 | INHALATION_SPRAY | Freq: Four times a day (QID) | RESPIRATORY_TRACT | 2 refills | Status: DC | PRN

## 2022-11-09 NOTE — Assessment & Plan Note (Signed)
S/p pacemaker placement.  On eliquis and metoprolol.  Continues amiodarone. With varying heart rate - increased at times.  Continue metoprolol 52m bid.  F/u with cardiology as outlined.

## 2022-11-09 NOTE — Assessment & Plan Note (Signed)
Seeing Dr Rockey Situ and EP.  On metoprolol 57m bid.  Further w/up as outlined.  Question of need for f/u monitor/echo - reassess heart function.

## 2022-11-09 NOTE — Assessment & Plan Note (Signed)
With recent admission as outlined.  Architectural technologist - home health - PT to evaluate and treat.

## 2022-11-09 NOTE — Telephone Encounter (Signed)
Notify pt that I sent in rx for albuterol inhaler instead of xopenex - due to back order on xopenex.  Let us know if any problems.

## 2022-11-09 NOTE — Assessment & Plan Note (Signed)
Low carb diet and exercise.  Follow met b and a1c.   

## 2022-11-09 NOTE — Assessment & Plan Note (Signed)
Has seen pulmonary. With recent admission - influenza with pneumonia.  Treated.  Still with increased sob with exertion. Plan f/u with cardiology as outlined.  Also, rescue inhaler if needed.  Recheck cxr.  Hold prednisone.  Discussed f/u with Dr Mortimer Fries - f/u scan and PFTs.

## 2022-11-09 NOTE — Assessment & Plan Note (Signed)
01/10/22 - Birads I.  Evaluated by Dr Bary Castilla 01/20/22 - recommended yearly f/u - Dr Windell Moment.

## 2022-11-09 NOTE — Assessment & Plan Note (Signed)
Continues on cymbalta.

## 2022-11-09 NOTE — Telephone Encounter (Signed)
Can we see if there is another pharmacy that can get the inhaler - [facility] ?

## 2022-11-09 NOTE — Assessment & Plan Note (Signed)
On lovastatin 

## 2022-11-09 NOTE — Assessment & Plan Note (Signed)
Persistent increased sob with exertion.  Recent admission - pneumonia and flu.  Treated.  Will plan f/u with pulmonary as outlined.  Discussed overnight oximetry.  Rescue inhaler if needed.  CPAP.  Will need f/u scan/xray.  Question - f/u PFTs.  EKG - paced rhythm with BBB.  With varying heart rates.  Discussed further cardiology w/up.  Underlying lung issues affecting.  Continue metoprolol.  F/u with Dr Rockey Situ.

## 2022-11-09 NOTE — Assessment & Plan Note (Signed)
On thyroid replacement.  Follow tsh.  

## 2022-11-09 NOTE — Assessment & Plan Note (Signed)
On metoprolol and lasix prn.   Blood pressure as outlined.  Continue current medication regimen. Hold on making changes.   Follow pressures.  Follow metabolic panel.

## 2022-11-09 NOTE — Assessment & Plan Note (Signed)
Upper symptoms appear to be controlled on omeprazole.

## 2022-11-09 NOTE — Assessment & Plan Note (Signed)
Has been felt to be benign essential tremor.  Has seen neurology.  Increased tremor with above issues.  F/u with neurology.

## 2022-11-09 NOTE — Assessment & Plan Note (Signed)
Continue on cymbalta.  Follow

## 2022-11-09 NOTE — Assessment & Plan Note (Signed)
Continue cpap. Discussed using regularly.

## 2022-11-09 NOTE — Assessment & Plan Note (Signed)
Follow cbc.  

## 2022-11-09 NOTE — Assessment & Plan Note (Addendum)
Saw Dr Mortimer Fries with recommendations: Bronchiectasis without complication ([place]). Continue flutter valve as tolerated. Repeat CT chest in 3 months Restart Flutter valve Three times a day  .  Mucinex Twice daily  As needed  cough/congestion  Albuterol inhaler 1-2 puffs every 6hr as needed for wheezing /shortness of breath.  With recent hospitalization as outlined.  Will prescribe xopenex - may not aggravate tremors as much.  Has tolerated albuterol previously.  CT scan in hospital -  Patchy ground-glass opacities throughout both lungs. Differential includes atypical/viral pneumonias, interstitial pneumonias, edema related to volume overload/CHF, hypersensitivity pneumonitis, and respiratory bronchiolitis. COVID pneumonia can have this appearance.  Discussed the need for f/u with pulmonary.  Also need to see if can arrange overnight oximetry.  Will need f/u xray/scan.

## 2022-11-09 NOTE — Telephone Encounter (Signed)
Xopenex inhaler we prescribed yesterday is on back order. Is there a replacement we can send in?

## 2022-11-09 NOTE — Assessment & Plan Note (Signed)
On lovastatin.  Continue diet and exercise.  Follow fasting profile and liver panel.

## 2022-11-09 NOTE — Telephone Encounter (Signed)
Spoke with Drue Dun at [facility]. They do not have any and could not get any ordered due to back order. Noted that there has been a shortage of supply

## 2022-11-10 ENCOUNTER — Other Ambulatory Visit: Payer: Self-pay

## 2022-11-10 DIAGNOSIS — R944 Abnormal results of kidney function studies: Secondary | ICD-10-CM

## 2022-11-10 DIAGNOSIS — E039 Hypothyroidism, unspecified: Secondary | ICD-10-CM

## 2022-11-10 MED ORDER — LEVOTHYROXINE SODIUM 125 MCG PO TABS: 125.0000 ug | ORAL_TABLET | Freq: Every day | ORAL | 3 refills | Status: DC

## 2022-11-10 NOTE — Telephone Encounter (Signed)
Patient aware of below.

## 2022-11-11 ENCOUNTER — Telehealth: Payer: Self-pay

## 2022-11-11 NOTE — Telephone Encounter (Signed)
-----   Message from Einar Pheasant, MD sent at 11/11/2022  4:25 AM EST ----- Please confirm how she is doing.  Breathing?  Notify - cxr reveals the lungs to be clear. Please let her know that I did contact Dr Rockey Situ.  Please schedule her an appt with cardiology - f/u sob with exertion.  Also schedule a f/u appt with Dr Mortimer Fries - persistent sob and question of need for overnight oximetry.

## 2022-11-11 NOTE — Telephone Encounter (Signed)
LMTCB

## 2022-11-14 ENCOUNTER — Telehealth: Payer: Self-pay

## 2022-11-14 NOTE — Telephone Encounter (Signed)
Alert remote reviewed. Normal device function.   Alert for ventricular pacing greater than limit, ventricular pacing at 76% of the time.  Sent to triage. Next remote 01/17/2023.  Kathy Breach, RN, CCDS, [employer]  Patient was in the hospital in January (10/11/22) with the flu/pneumonia with CHF/fluid overload on CT.  She is having increased SOB, weakness/trembling, s/s of fluid retention.  Saw PCP last week who recommended full cardiac work up.  She has f/u with primary provider and pulmonology.  Patient requests appt with [place] this week.   Forwarding to Dr. Ileana Ladd and A. Tillery, PA-C for further review. Cc: to Dr. Rockey Situ.  Patient has appointment with Jonni Sanger tomorrow 11/15/22 at 1220pm per her request.   NOTE: VP 39% IN October. Now: VP 76%  Also, noted patient's base rate is 80bpm.          0

## 2022-11-15 ENCOUNTER — Encounter: Payer: Self-pay | Admitting: Student

## 2022-11-15 ENCOUNTER — Ambulatory Visit: Payer: PPO | Attending: Student | Admitting: Student

## 2022-11-15 VITALS — BP 124/70 | HR 83 | Ht 65.0 in | Wt 171.0 lb

## 2022-11-15 DIAGNOSIS — R0602 Shortness of breath: Secondary | ICD-10-CM | POA: Diagnosis not present

## 2022-11-15 DIAGNOSIS — I495 Sick sinus syndrome: Secondary | ICD-10-CM | POA: Diagnosis not present

## 2022-11-15 DIAGNOSIS — I493 Ventricular premature depolarization: Secondary | ICD-10-CM

## 2022-11-15 DIAGNOSIS — R5383 Other fatigue: Secondary | ICD-10-CM | POA: Diagnosis not present

## 2022-11-15 DIAGNOSIS — J17 Pneumonia in diseases classified elsewhere: Secondary | ICD-10-CM | POA: Diagnosis not present

## 2022-11-15 DIAGNOSIS — I4819 Other persistent atrial fibrillation: Secondary | ICD-10-CM

## 2022-11-15 DIAGNOSIS — I10 Essential (primary) hypertension: Secondary | ICD-10-CM | POA: Diagnosis not present

## 2022-11-15 DIAGNOSIS — R2689 Other abnormalities of gait and mobility: Secondary | ICD-10-CM | POA: Diagnosis not present

## 2022-11-15 DIAGNOSIS — R2681 Unsteadiness on feet: Secondary | ICD-10-CM | POA: Diagnosis not present

## 2022-11-15 LAB — CUP PACEART INCLINIC DEVICE CHECK
Battery Remaining Longevity: 60 mo
Battery Voltage: 2.98 V
Brady Statistic RA Percent Paced: 98 %
Brady Statistic RV Percent Paced: 76 %
Date Time Interrogation Session: 20240220124958
Implantable Lead Connection Status: 753985
Implantable Lead Connection Status: 753985
Implantable Lead Implant Date: 20210416
Implantable Lead Implant Date: 20210416
Implantable Lead Location: 753859
Implantable Lead Location: 753860
Implantable Pulse Generator Implant Date: 20210416
Lead Channel Impedance Value: 375 Ohm
Lead Channel Impedance Value: 512.5 Ohm
Lead Channel Pacing Threshold Amplitude: 0.75 V
Lead Channel Pacing Threshold Amplitude: 0.75 V
Lead Channel Pacing Threshold Amplitude: 0.75 V
Lead Channel Pacing Threshold Amplitude: 0.75 V
Lead Channel Pacing Threshold Pulse Width: 0.5 ms
Lead Channel Pacing Threshold Pulse Width: 0.5 ms
Lead Channel Pacing Threshold Pulse Width: 0.5 ms
Lead Channel Pacing Threshold Pulse Width: 0.5 ms
Lead Channel Sensing Intrinsic Amplitude: 1.1 mV
Lead Channel Sensing Intrinsic Amplitude: 12 mV
Lead Channel Setting Pacing Amplitude: 2 V
Lead Channel Setting Pacing Amplitude: 2.5 V
Lead Channel Setting Pacing Pulse Width: 0.5 ms
Lead Channel Setting Sensing Sensitivity: 2 mV
Pulse Gen Model: 2272
Pulse Gen Serial Number: 3813093

## 2022-11-15 NOTE — Progress Notes (Signed)
Remote pacemaker transmission.   

## 2022-11-15 NOTE — Progress Notes (Unsigned)
Electrophysiology Office Note Date: 11/15/2022  ID:  Kerri Mills, Kerri Mills Jul 28, 1931, MRN [id number]  PCP: Einar Pheasant, MD Primary Cardiologist: Ida Rogue, MD Electrophysiologist: Will Meredith Leeds, MD   CC: Pacemaker follow-up  Kerri Mills is a 89 y.o. female seen today for Will Meredith Leeds, MD for acute visit due to SOB.    Patient reports she had admission for flu/PNA in January. She has had gradually worsening SOB for approx 1 year.  She is at times SOB with ADLs such as bathing or getting dressed.  This has not particularly gotten better or worse since her flu/PNA.  She denies edema. She was previously prescribed lasix to use as needed up to three times a week, but hasn't felt like she needed it. She reports orthopnea at times, uses CPAP nightly.   Device History: Abbott Dual Chamber PPM implanted 12/2019 for SSS/Tachy-brady  Past Medical History:  Diagnosis Date   Allergy    Anxiety    Arthritis    Atypical chest pain    a. 10/2018 MV: small, fixed apical defect possibly 2/2 attenuation artifact. No ischemia.  EF 68%.   Clotting disorder ([place])    Colitis    Depression    Diverticulitis 2013   Dysrhythmia    Gastric ulcer    GERD (gastroesophageal reflux disease)    History of echocardiogram    a. 09/2019 Echo: EF 55-60%, mod LVH. Mildly dil LA. Triv MR/TR.   Hypercholesterolemia    Hypertension    Hypothyroidism    Infiltrating lobular carcinoma of left breast 2011   T2,N0, ER: 90%; PR 0%; Her 2 neu not amplified. [facility]).   Melanoma ([place]) 1997   Melanoma in situ of upper extremity ([place]) 03/19/2011   Persistent atrial fibrillation ([place])    a. CHADS2VASc => 4 (HTN, age x 2, female)   Personal history of radiation therapy 2011   BREAST CA   Presence of permanent cardiac pacemaker    Seroma    HISTORY OF LFT BREAST   Sleep apnea    Thyroid cancer ([place]) 1992    Current Outpatient Medications  Medication Instructions   acetaminophen  (TYLENOL) 650 mg, Oral, Every 8 hours PRN   albuterol (VENTOLIN HFA) 108 (90 Base) MCG/ACT inhaler 2 puffs, Inhalation, Every 6 hours PRN   amiodarone (PACERONE) 200 mg, Oral, Daily   azelastine (ASTELIN) 0.1 % nasal spray 1 spray, Each Nare, 2 times daily, Use in each nostril as directed   cholecalciferol (VITAMIN D3) 2,000 Units, Oral, Daily   DULoxetine (CYMBALTA) 60 mg, Oral, Daily at bedtime   Eliquis 5 mg, Oral, 2 times daily   gabapentin (NEURONTIN) 300 MG capsule TAKE 2 CAPSULES BY MOUTH AT BEDTIME   levothyroxine (SYNTHROID) 125 mcg, Oral, Daily   lovastatin (MEVACOR) 40 MG tablet TAKE 1 TABLET BY MOUTH DAILY   Metoprolol Tartrate 75 mg, Oral, 2 times daily   Multiple Vitamins-Minerals (PRESERVISION AREDS 2+MULTI VIT PO) 1 tablet, Oral, 2 times daily   omeprazole (PRILOSEC) 20 mg, Oral, Daily   Polyethyl Glycol-Propyl Glycol (SYSTANE OP) 1 drop, Both Eyes, Daily PRN   polyethylene glycol powder (GLYCOLAX/MIRALAX) 17 g, Oral, Daily   Probiotic Product (ALIGN) 4 MG CAPS One capsule daily while on antibiotic and for two weeks after complete antibiotic   triamcinolone cream (KENALOG) 0.1 % 1 application , Topical, 2 times daily   White Petrolatum-Mineral Oil (GENTEAL TEARS NIGHT-TIME OP) 1 application , Both Eyes, Daily at bedtime, Night time  ointment 3.5g     Family History: Family History  Problem Relation Age of Onset   Heart disease Mother    Cancer Brother        lung    Cancer Sister        breast   Breast cancer Neg Hx     Physical Exam: Vitals:   11/15/22 1215  BP: 124/70  Pulse: 83  SpO2: 96%  Weight: 171 lb (77.6 kg)  Height: 5' 5"$  (1.651 m)     GEN- NAD. A&O x 3. Normal affect HEENT: Normocephalic, atraumatic Lungs- CTAB, Normal effort.  Heart- Regular rate and rhythm rate and rhythm. No M/G/R.  Extremities- No peripheral edema. no clubbing or cyanosis Skin- warm and dry, no rash or lesion, PPM pocket well healed.  PPM Interrogation-  reviewed in detail  today,  See PACEART report.  EKG is not ordered today. EKG from 11/08/2022 reviewed which showed NSR with  AV dual pacing and RBBB  Other studies Reviewed: Additional studies/ records that were reviewed today include: Previous EP office notes, Previous remote checks, Most recent labwork.   Assessment and Plan:  1. Tachy-Brady syndrome s/p Abbott PPM  Normal PPM function See Claudia Desanctis Art report No changes today LRL increased to 80 06/03/22 in attempt to overdrive PVCs  2. PAF  Continue eliquis and amiodarone Labs today.   3. PVCs Historically have made her feel weak and fatigued Amiodarone started 06/2022. Surveillance labs today.  Update Echo  Burden < 1% by device.   4. Chronic -diastolic- CHF Echo [id number] LVEF 55-60% Update echo.  ? If increased RV pacing has caused LV dysfunction.  Volume status looks OK on exam.   5. DOE Suspect multifactorial DDx includes amiodarone toxicity, CHF (Diastolic OR systolic given increased RV pacing), deconditioning, COPD, or prolonged convalescence from recent admission, potentially in the setting of any of the preceding.  Update Echo and labwork.  Consider updating PFTs once she is further from her admission, especially if cardiac work up negative. PFTs were slightly abnormal 04/2021. Her SOB precedes amiodarone initiation, so I think toxicity is less likely.  Will add spironolactone pending labwork  Current medicines are reviewed at length with the patient today.    Labs/ tests ordered today include:  Orders Placed This Encounter  Procedures   Basic metabolic panel   TSH   T4, free   Pro b natriuretic peptide (BNP)   CUP PACEART INCLINIC DEVICE CHECK   ECHOCARDIOGRAM COMPLETE     Disposition:   Follow up with EP APP in 4 weeks   Signed, Shirley Friar, PA-C  11/15/2022 12:24 PM  [place] [address] [phone] (office) [phone] (fax)

## 2022-11-15 NOTE — Patient Instructions (Signed)
Medication Instructions:  Your physician recommends that you continue on your current medications as directed. Please refer to the Current Medication list given to you today.  *If you need a refill on your cardiac medications before your next appointment, please call your pharmacy*   Lab Work: TODAY:  BMET, PRO BNP, TSH, & FREE T4  If you have labs (blood work) drawn today and your tests are completely normal, you will receive your results only by: MyChart Message (if you have MyChart) OR A paper copy in the mail If you have any lab test that is abnormal or we need to change your treatment, we will call you to review the results.   Testing/Procedures: Your physician has requested that you have an echocardiogram. Echocardiography is a painless test that uses sound waves to create images of your heart. It provides your doctor with information about the size and shape of your heart and how well your heart's chambers and valves are working. This procedure takes approximately one hour. There are no restrictions for this procedure. Please do NOT wear cologne, perfume, aftershave, or lotions (deodorant is allowed). Please arrive 15 minutes prior to your appointment time.'   Follow-Up: At [facility], you and your health needs are our priority.  As part of our continuing mission to provide you with exceptional heart care, we have created designated Provider Care Teams.  These Care Teams include your primary Cardiologist (physician) and Advanced Practice Providers (APPs -  Physician Assistants and Nurse Practitioners) who all work together to provide you with the care you need, when you need it.  We recommend signing up for the patient portal called "MyChart".  Sign up information is provided on this After Visit Summary.  MyChart is used to connect with patients for Virtual Visits (Telemedicine).  Patients are able to view lab/test results, encounter notes, upcoming appointments, etc.   Non-urgent messages can be sent to your provider as well.   To learn more about what you can do with MyChart, go to [email or web address].    Your next appointment:   1 month(s)  Provider:   You will see one of the following Advanced Practice Providers on your designated Care Team:    [name] "Jonni Sanger" Chalmers Cater, [place]       Other Instructions

## 2022-11-16 ENCOUNTER — Telehealth: Payer: Self-pay | Admitting: *Deleted

## 2022-11-16 ENCOUNTER — Other Ambulatory Visit: Payer: Self-pay | Admitting: *Deleted

## 2022-11-16 DIAGNOSIS — Z79899 Other long term (current) drug therapy: Secondary | ICD-10-CM

## 2022-11-16 LAB — BASIC METABOLIC PANEL
BUN/Creatinine Ratio: 15 (ref 12–28)
BUN: 16 mg/dL (ref 8–27)
CO2: 25 mmol/L (ref 20–29)
Calcium: 10.4 mg/dL — ABNORMAL HIGH (ref 8.7–10.3)
Chloride: 97 mmol/L (ref 96–106)
Creatinine, Ser: 1.1 mg/dL — ABNORMAL HIGH (ref 0.57–1.00)
Glucose: 103 mg/dL — ABNORMAL HIGH (ref 70–99)
Potassium: 4.4 mmol/L (ref 3.5–5.2)
Sodium: 135 mmol/L (ref 134–144)
eGFR: 48 mL/min/{1.73_m2} — ABNORMAL LOW (ref >59)

## 2022-11-16 LAB — T4, FREE: Free T4: 1.43 ng/dL (ref 0.82–1.77)

## 2022-11-16 LAB — TSH: TSH: 10.2 u[IU]/mL — ABNORMAL HIGH (ref 0.450–4.500)

## 2022-11-16 LAB — PRO B NATRIURETIC PEPTIDE: NT-Pro BNP: 11648 pg/mL — ABNORMAL HIGH (ref 0–738)

## 2022-11-16 MED ORDER — FUROSEMIDE 20 MG PO TABS: ORAL_TABLET | ORAL | 3 refills | Status: DC

## 2022-11-16 MED ORDER — POTASSIUM CHLORIDE CRYS ER 10 MEQ PO TBCR: EXTENDED_RELEASE_TABLET | ORAL | 1 refills | Status: DC

## 2022-11-16 MED ORDER — SPIRONOLACTONE 25 MG PO TABS: 25.0000 mg | ORAL_TABLET | Freq: Every day | ORAL | 3 refills | Status: DC

## 2022-11-16 NOTE — Telephone Encounter (Signed)
-----   Message from Shirley Friar, PA-C sent at 11/16/2022  8:54 AM EST ----- TSH is elevated but free T4 WNL.  No change.  BMET OK.   Her BNP is VERY elevated.    We need to try and move her Echo up if possible.   Can we please start Spironolactone 25 mg daily.  I would also like her to take lasix 40 meq x 3 days, then 40 mg on Mondays and Fridays, with additional as needed (for weight gain of 3 lbs overnight or 5 lbs within one week) She should take 20 meq of potassium on days that she takes lasix.   She will need a BMET in 1 week, then would move her follow up with me up to 2 weeks. Ideally, would have Echo done next week prior to her follow up.

## 2022-11-16 NOTE — Telephone Encounter (Signed)
Pt aware of her lab results. She will take Lasix 20 mg 2 tablets daily X's 3 days along with Potassium 10 meq 2 tablets X's3 days, then she will reduce it to Lasix 20 mg 2 tablets & Potassium 10 meq 2 tablets on Mondays & Fridays, pt aware she may take an extra tablet if has weight gain of 3 lbs overnight or 5 lbs in 1 week.  Pt also aware to start Spironolactone 25 mg taking 1 daily.   Will work on moving her Echo up and her appt with Jonni Sanger.

## 2022-11-18 DIAGNOSIS — R2681 Unsteadiness on feet: Secondary | ICD-10-CM | POA: Diagnosis not present

## 2022-11-18 DIAGNOSIS — R2689 Other abnormalities of gait and mobility: Secondary | ICD-10-CM | POA: Diagnosis not present

## 2022-11-18 DIAGNOSIS — J17 Pneumonia in diseases classified elsewhere: Secondary | ICD-10-CM | POA: Diagnosis not present

## 2022-11-21 DIAGNOSIS — J17 Pneumonia in diseases classified elsewhere: Secondary | ICD-10-CM | POA: Diagnosis not present

## 2022-11-21 DIAGNOSIS — R2681 Unsteadiness on feet: Secondary | ICD-10-CM | POA: Diagnosis not present

## 2022-11-21 DIAGNOSIS — J410 Simple chronic bronchitis: Secondary | ICD-10-CM | POA: Diagnosis not present

## 2022-11-21 DIAGNOSIS — J479 Bronchiectasis, uncomplicated: Secondary | ICD-10-CM | POA: Diagnosis not present

## 2022-11-21 DIAGNOSIS — G4733 Obstructive sleep apnea (adult) (pediatric): Secondary | ICD-10-CM | POA: Diagnosis not present

## 2022-11-21 DIAGNOSIS — R2689 Other abnormalities of gait and mobility: Secondary | ICD-10-CM | POA: Diagnosis not present

## 2022-11-22 ENCOUNTER — Ambulatory Visit (INDEPENDENT_AMBULATORY_CARE_PROVIDER_SITE_OTHER): Payer: PPO | Admitting: Student in an Organized Health Care Education/Training Program

## 2022-11-22 ENCOUNTER — Encounter: Payer: Self-pay | Admitting: Student in an Organized Health Care Education/Training Program

## 2022-11-22 VITALS — BP 120/70 | HR 90 | Temp 97.9°F | Ht 65.0 in | Wt 170.8 lb

## 2022-11-22 DIAGNOSIS — R0602 Shortness of breath: Secondary | ICD-10-CM

## 2022-11-22 DIAGNOSIS — R2681 Unsteadiness on feet: Secondary | ICD-10-CM | POA: Diagnosis not present

## 2022-11-22 DIAGNOSIS — R9389 Abnormal findings on diagnostic imaging of other specified body structures: Secondary | ICD-10-CM | POA: Diagnosis not present

## 2022-11-22 DIAGNOSIS — G4733 Obstructive sleep apnea (adult) (pediatric): Secondary | ICD-10-CM | POA: Diagnosis not present

## 2022-11-22 DIAGNOSIS — J17 Pneumonia in diseases classified elsewhere: Secondary | ICD-10-CM | POA: Diagnosis not present

## 2022-11-22 DIAGNOSIS — J479 Bronchiectasis, uncomplicated: Secondary | ICD-10-CM | POA: Diagnosis not present

## 2022-11-22 DIAGNOSIS — R2689 Other abnormalities of gait and mobility: Secondary | ICD-10-CM | POA: Diagnosis not present

## 2022-11-22 MED ORDER — BREZTRI AEROSPHERE 160-9-4.8 MCG/ACT IN AERO: 2.0000 | INHALATION_SPRAY | Freq: Two times a day (BID) | RESPIRATORY_TRACT | 0 refills | Status: DC

## 2022-11-22 MED ORDER — CETIRIZINE HCL 5 MG PO TABS: 5.0000 mg | ORAL_TABLET | Freq: Every day | ORAL | 6 refills | Status: DC

## 2022-11-22 NOTE — Progress Notes (Signed)
Synopsis: Acute Visit  Assessment & Plan:   1. Shortness of breath 2. Abnormal chest CT 3. Bronchiectasis without complication ([place]) 4. OSA (obstructive sleep apnea)  Presenting for the evaluation of increased shortness of breath after a recent hospitalization for influenza. Patient's physical exam is reassuring with no wheezing, rales or rhonchi. Chest CT, on my review, is notable for mosaicism, which was previously seen on prior CT's, in addition to the bronchiectasis. The differential for the mosaic attenuation includes a small airways process/reactive airway disease, occlusive vascular disease, and infiltrative interstitial disease (an idiopathic interstitial pneumonia vs hypersensitivity pneumonitis). She will need a high resolution CT with inspiratory and expiratory films to help narrow the differential diagnosis.  She trended on room air with no hypoxia, and would not benefit from overnight oxymetry at the moment. I will give the patient a sample of a longer acting inhaler and assess for symptomatic improvement on follow up.   - CT CHEST HIGH RESOLUTION; Future  Return in about 3 months (around 02/20/2023). Follow up with Dr. Mortimer Fries.  I spent 30 minutes caring for this patient today, including preparing to see the patient, obtaining a medical history , reviewing a separately obtained history, performing a medically appropriate examination and/or evaluation, counseling and educating the patient/family/caregiver, ordering medications, tests, or procedures, referring and communicating with other health care professionals (not separately reported), documenting clinical information in the electronic health record, and independently interpreting results (not separately reported/billed) and communicating results to the patient/family/caregiver  Armando Reichert, MD [facility] 11/22/2022 12:14 PM    End of visit medications:  Meds ordered this encounter  Medications    Budeson-Glycopyrrol-Formoterol (BREZTRI AEROSPHERE) 160-9-4.8 MCG/ACT AERO    Sig: Inhale 2 puffs into the lungs in the morning and at bedtime.    Dispense:  11.8 g    Refill:  0    Order Specific Question:   Lot Number?    Answer:   [id number]    Order Specific Question:   Expiration Date?    Answer:   10/28/2023    Order Specific Question:   Manufacturer?    Answer:   [employer] [[id number]]    Order Specific Question:   Quantity    Answer:   2     Current Outpatient Medications:    acetaminophen (TYLENOL) 650 MG CR tablet, Take 650 mg by mouth every 8 (eight) hours as needed for pain., Disp: , Rfl:    albuterol (VENTOLIN HFA) 108 (90 Base) MCG/ACT inhaler, Inhale 2 puffs into the lungs every 6 (six) hours as needed for wheezing or shortness of breath., Disp: 18 g, Rfl: 2   amiodarone (PACERONE) 200 MG tablet, TAKE 1 TABLET BY MOUTH DAILY, Disp: 30 tablet, Rfl: 2   azelastine (ASTELIN) 0.1 % nasal spray, Place 1 spray into both nostrils 2 (two) times daily. Use in each nostril as directed, Disp: 30 mL, Rfl: 0   Budeson-Glycopyrrol-Formoterol (BREZTRI AEROSPHERE) 160-9-4.8 MCG/ACT AERO, Inhale 2 puffs into the lungs in the morning and at bedtime., Disp: 11.8 g, Rfl: 0   cholecalciferol (VITAMIN D3) 25 MCG (1000 UNIT) tablet, Take 2,000 Units by mouth daily., Disp: , Rfl:    DULoxetine (CYMBALTA) 60 MG capsule, TAKE ONE CAPSULE AT BEDTIME, Disp: 90 capsule, Rfl: 1   ELIQUIS 5 MG TABS tablet, TAKE ONE TABLET BY MOUTH TWICE DAILY, Disp: 180 tablet, Rfl: 1   furosemide (LASIX) 20 MG tablet, Take 2 tablets by mouth daily X's 3 days then reduce to 2  tablets by mouth on Mondays & Fridays, Disp: 90 tablet, Rfl: 3   gabapentin (NEURONTIN) 300 MG capsule, TAKE 2 CAPSULES BY MOUTH AT BEDTIME, Disp: 180 capsule, Rfl: 1   levothyroxine (SYNTHROID) 125 MCG tablet, Take 1 tablet (125 mcg total) by mouth daily., Disp: 90 tablet, Rfl: 3   lovastatin (MEVACOR) 40 MG tablet, TAKE 1 TABLET BY MOUTH DAILY, Disp: 90  tablet, Rfl: 3   metoprolol tartrate 75 MG TABS, Take 1 tablet (75 mg total) by mouth 2 (two) times daily., Disp: , Rfl:    Multiple Vitamins-Minerals (PRESERVISION AREDS 2+MULTI VIT PO), Take 1 tablet by mouth 2 (two) times daily., Disp: , Rfl:    omeprazole (PRILOSEC) 20 MG capsule, TAKE 1 CAPSULE BY MOUTH ONCE DAILY, Disp: 90 capsule, Rfl: 3   Polyethyl Glycol-Propyl Glycol (SYSTANE OP), Place 1 drop into both eyes daily as needed (for dry eyes)., Disp: , Rfl:    polyethylene glycol powder (GLYCOLAX/MIRALAX) 17 GM/SCOOP powder, Take 17 g by mouth daily., Disp: 850 g, Rfl: 1   potassium chloride (KLOR-CON M) 10 MEQ tablet, Take 2 tablets by mouth daily X's 3 days then reduce to 2 tablets by mouth on Mondays & Fridays, Disp: 90 tablet, Rfl: 1   Probiotic Product (ALIGN) 4 MG CAPS, One capsule daily while on antibiotic and for two weeks after complete antibiotic, Disp: 30 capsule, Rfl: 0   spironolactone (ALDACTONE) 25 MG tablet, Take 1 tablet (25 mg total) by mouth daily., Disp: 90 tablet, Rfl: 3   triamcinolone cream (KENALOG) 0.1 %, Apply 1 application. topically 2 (two) times daily., Disp: 30 g, Rfl: 0   White Petrolatum-Mineral Oil (GENTEAL TEARS NIGHT-TIME OP), Place 1 application into both eyes at bedtime. Night time ointment 3.5g, Disp: , Rfl:    Subjective:   PATIENT ID: Kerri Mills GENDER: female DOB: 03/30/1931, MRN: [id number]  Chief Complaint  Patient presents with   Acute Visit    Flu and PNA 01/23--increased SOB with exertion. PCP recommended ONO.     HPI  Kerri Mills is a pleasant 89 year old female with a past medical history of bronchiectasis, afib, OSA, and HTN who presents to clinic for an acute visit.  She follows with Dr. Mortimer Fries in our clinic, and was recently admitted to the hospital in January of 2024 for influenza pneumonia, requiring IV antibiotics, steroids, nebulizers, and oxygen therapy. She was weaned to room air and discharged home. She reports some nasal  congestion that she tried some mucinex for. She has an occasional cough, and shortness of breath with exertion. She was seen by her PCP Dr. Nicki Reaper who felt that the patient might require oxygen and has asked that she be seen for an acute visit in clinic. Recent imaging included a CT of the chest with PE protocol on 10/11/2022 notable for patchy ground glass opacities throughout both lungs, and no clot. She has known bronchiectasis with no increase in sputum production. The sputum did change color around the time of her hospitalization but this has since improved.   Ancillary information including prior medications, full medical/surgical/family/social histories, and PFTs (when available) are listed below and have been reviewed.   Review of Systems  Constitutional:  Negative for chills, fever, malaise/fatigue and weight loss.  Respiratory:  Positive for cough, sputum production and shortness of breath. Negative for hemoptysis and wheezing.   Cardiovascular:  Negative for chest pain, palpitations and leg swelling.  Skin:  Negative for rash.     Objective:  Vitals:   11/22/22 1134  BP: 120/70  Pulse: 90  Temp: 97.9 F (36.6 C)  TempSrc: Temporal  SpO2: 96%  Weight: 170 lb 12.8 oz (77.5 kg)  Height: '5\' 5"'$  (1.651 m)   96% on RA. Trended 300 feet on room air, maintained saturation of 94%.  BMI Readings from Last 3 Encounters:  11/22/22 28.42 kg/m  11/15/22 28.46 kg/m  11/08/22 28.36 kg/m   Wt Readings from Last 3 Encounters:  11/22/22 170 lb 12.8 oz (77.5 kg)  11/15/22 171 lb (77.6 kg)  11/08/22 170 lb 6.4 oz (77.3 kg)    Physical Exam Constitutional:      General: She is not in acute distress.    Appearance: Normal appearance. She is not ill-appearing.  HENT:     Head: Normocephalic.     Mouth/Throat:     Mouth: Mucous membranes are moist.  Cardiovascular:     Rate and Rhythm: Normal rate and regular rhythm.     Pulses: Normal pulses.     Heart sounds: Normal heart  sounds.  Pulmonary:     Effort: Pulmonary effort is normal.     Breath sounds: Normal breath sounds. No wheezing, rhonchi or rales.  Abdominal:     Palpations: Abdomen is soft.  Musculoskeletal:     Cervical back: Neck supple.  Neurological:     General: No focal deficit present.     Mental Status: She is alert and oriented to person, place, and time. Mental status is at baseline.     Ancillary Information    Past Medical History:  Diagnosis Date   Allergy    Anxiety    Arthritis    Atypical chest pain    a. 10/2018 MV: small, fixed apical defect possibly 2/2 attenuation artifact. No ischemia.  EF 68%.   Clotting disorder ([place])    Colitis    Depression    Diverticulitis 2013   Dysrhythmia    Gastric ulcer    GERD (gastroesophageal reflux disease)    History of echocardiogram    a. 09/2019 Echo: EF 55-60%, mod LVH. Mildly dil LA. Triv MR/TR.   Hypercholesterolemia    Hypertension    Hypothyroidism    Infiltrating lobular carcinoma of left breast 2011   T2,N0, ER: 90%; PR 0%; Her 2 neu not amplified. [facility]).   Melanoma ([place]) 1997   Melanoma in situ of upper extremity ([place]) 03/19/2011   Persistent atrial fibrillation ([place])    a. CHADS2VASc => 4 (HTN, age x 2, female)   Personal history of radiation therapy 2011   BREAST CA   Presence of permanent cardiac pacemaker    Seroma    HISTORY OF LFT BREAST   Sleep apnea    Thyroid cancer ([place]) 1992     Family History  Problem Relation Age of Onset   Heart disease Mother    Cancer Brother        lung    Cancer Sister        breast   Breast cancer Neg Hx      Past Surgical History:  Procedure Laterality Date   ABDOMINAL HYSTERECTOMY  1973   partial   BREAST BIOPSY Left 02-13-13   BENIGN BREAST TISSUE WITH CHANGES CONSISTENT WITH FAT NECROSIS   BREAST BIOPSY Left 01/21/2015   bx done in [facility] office 11:00 left 6-8cmfn   BREAST EXCISIONAL BIOPSY Left 1995   neg   BREAST EXCISIONAL BIOPSY Left 2011    Breast cancer radiation  BREAST LUMPECTOMY Left 2011   BREAST CA   CARDIAC CATHETERIZATION     CHOLECYSTECTOMY     COLONOSCOPY  2013   COLONOSCOPY WITH PROPOFOL N/A 06/09/2021   Procedure: COLONOSCOPY WITH PROPOFOL;  Surgeon: Jonathon Bellows, MD;  [facility];  Service: Gastroenterology;  Laterality: N/A;   HAMMER TOE SURGERY Bilateral 02/24/2022   Procedure: HAMMER TOE CORRECTION 2, 3, 4 Right and 2nd Left;  Surgeon: Edrick Kins, DPM;  Location: [facility] ORS;  Service: Podiatry;  Laterality: Bilateral;   MELANOMA EXCISION     RT UPPER ARM   PACEMAKER IMPLANT N/A 01/10/2020   Procedure: PACEMAKER IMPLANT;  Surgeon: Constance Haw, MD;  Location: [facility] CV LAB;  Service: Cardiovascular;  Laterality: N/A;   PARTIAL HYSTERECTOMY     bleeding, ovaries in place.     THYROID SURGERY  1992   FOR THYROID CANCER   TONSILLECTOMY      Social History   Socioeconomic History   Marital status: Widowed    Spouse name: Not on file   Number of children: Not on file   Years of education: Not on file   Highest education level: Not on file  Occupational History   Not on file  Tobacco Use   Smoking status: Never   Smokeless tobacco: Never   Tobacco comments:    never  Vaping Use   Vaping Use: Never used  Substance and Sexual Activity   Alcohol use: Yes    Comment: once in a while   Drug use: No   Sexual activity: Never  Other Topics Concern   Not on file  Social History Narrative   Independent and baseline. Lives by herself   Social Determinants of Health   Financial Resource Strain: Low Risk  (02/08/2022)   Overall Financial Resource Strain (CARDIA)    Difficulty of Paying Living Expenses: Not hard at all  Food Insecurity: No Food Insecurity (10/17/2022)   Hunger Vital Sign    Worried About Running Out of Food in the Last Year: Never true    Ran Out of Food in the Last Year: Never true  Transportation Needs: No Transportation Needs (10/17/2022)   PRAPARE -  Hydrologist (Medical): No    Lack of Transportation (Non-Medical): No  Physical Activity: Sufficiently Active (02/08/2022)   Exercise Vital Sign    Days of Exercise per Week: 4 days    Minutes of Exercise per Session: 40 min  Stress: No Stress Concern Present (02/08/2022)   [place]    Feeling of Stress : Only a little  Social Connections: Unknown (02/08/2022)   Social Connection and Isolation Panel [NHANES]    Frequency of Communication with Friends and Family: More than three times a week    Frequency of Social Gatherings with Friends and Family: More than three times a week    Attends Religious Services: Not on file    Active Member of Clubs or Organizations: Not on file    Attends Archivist Meetings: Not on file    Marital Status: Widowed  Intimate Partner Violence: Not At Risk (10/11/2022)   Humiliation, Afraid, Rape, and Kick questionnaire    Fear of Current or Ex-Partner: No    Emotionally Abused: No    Physically Abused: No    Sexually Abused: No     Allergies  Allergen Reactions   Lipitor [Atorvastatin Calcium] Other (See Comments)    Stiffness &  soreness   Penicillins Rash     CBC    Component Value Date/Time   WBC 6.3 11/08/2022 1038   RBC 4.43 11/08/2022 1038   HGB 13.8 11/08/2022 1038   HGB 12.1 11/09/2018 1133   HCT 41.9 11/08/2022 1038   HCT 37.8 11/09/2018 1133   PLT 232.0 11/08/2022 1038   PLT 206 11/09/2018 1133   MCV 94.7 11/08/2022 1038   MCV 90 11/09/2018 1133   MCH 29.9 10/12/2022 0321   MCHC 33.0 11/08/2022 1038   RDW 14.0 11/08/2022 1038   RDW 12.3 11/09/2018 1133   LYMPHSABS 1.3 11/08/2022 1038   MONOABS 0.7 11/08/2022 1038   EOSABS 0.2 11/08/2022 1038   BASOSABS 0.1 11/08/2022 1038    Pulmonary Functions Testing Results:     No data to display          Outpatient Medications Prior to Visit  Medication Sig Dispense Refill    acetaminophen (TYLENOL) 650 MG CR tablet Take 650 mg by mouth every 8 (eight) hours as needed for pain.     albuterol (VENTOLIN HFA) 108 (90 Base) MCG/ACT inhaler Inhale 2 puffs into the lungs every 6 (six) hours as needed for wheezing or shortness of breath. 18 g 2   amiodarone (PACERONE) 200 MG tablet TAKE 1 TABLET BY MOUTH DAILY 30 tablet 2   azelastine (ASTELIN) 0.1 % nasal spray Place 1 spray into both nostrils 2 (two) times daily. Use in each nostril as directed 30 mL 0   cholecalciferol (VITAMIN D3) 25 MCG (1000 UNIT) tablet Take 2,000 Units by mouth daily.     DULoxetine (CYMBALTA) 60 MG capsule TAKE ONE CAPSULE AT BEDTIME 90 capsule 1   ELIQUIS 5 MG TABS tablet TAKE ONE TABLET BY MOUTH TWICE DAILY 180 tablet 1   furosemide (LASIX) 20 MG tablet Take 2 tablets by mouth daily X's 3 days then reduce to 2 tablets by mouth on Mondays & Fridays 90 tablet 3   gabapentin (NEURONTIN) 300 MG capsule TAKE 2 CAPSULES BY MOUTH AT BEDTIME 180 capsule 1   levothyroxine (SYNTHROID) 125 MCG tablet Take 1 tablet (125 mcg total) by mouth daily. 90 tablet 3   lovastatin (MEVACOR) 40 MG tablet TAKE 1 TABLET BY MOUTH DAILY 90 tablet 3   metoprolol tartrate 75 MG TABS Take 1 tablet (75 mg total) by mouth 2 (two) times daily.     Multiple Vitamins-Minerals (PRESERVISION AREDS 2+MULTI VIT PO) Take 1 tablet by mouth 2 (two) times daily.     omeprazole (PRILOSEC) 20 MG capsule TAKE 1 CAPSULE BY MOUTH ONCE DAILY 90 capsule 3   Polyethyl Glycol-Propyl Glycol (SYSTANE OP) Place 1 drop into both eyes daily as needed (for dry eyes).     polyethylene glycol powder (GLYCOLAX/MIRALAX) 17 GM/SCOOP powder Take 17 g by mouth daily. 850 g 1   potassium chloride (KLOR-CON M) 10 MEQ tablet Take 2 tablets by mouth daily X's 3 days then reduce to 2 tablets by mouth on Mondays & Fridays 90 tablet 1   Probiotic Product (ALIGN) 4 MG CAPS One capsule daily while on antibiotic and for two weeks after complete antibiotic 30 capsule 0    spironolactone (ALDACTONE) 25 MG tablet Take 1 tablet (25 mg total) by mouth daily. 90 tablet 3   triamcinolone cream (KENALOG) 0.1 % Apply 1 application. topically 2 (two) times daily. 30 g 0   White Petrolatum-Mineral Oil (GENTEAL TEARS NIGHT-TIME OP) Place 1 application into both eyes at bedtime. Night time ointment 3.5g  No facility-administered medications prior to visit.

## 2022-11-22 NOTE — Patient Instructions (Signed)
We are going to give you a trial of Breztri, use one puff twice daily. If you feel improved, please reach back to our office or your PCP in order to send you a prescription. I will also order you a high resolution chest CT to re-evaluate some of the findings on your chest CT. You don't need overnight oxymetry.

## 2022-11-23 ENCOUNTER — Telehealth: Payer: Self-pay | Admitting: Internal Medicine

## 2022-11-23 DIAGNOSIS — R2689 Other abnormalities of gait and mobility: Secondary | ICD-10-CM | POA: Diagnosis not present

## 2022-11-23 DIAGNOSIS — J17 Pneumonia in diseases classified elsewhere: Secondary | ICD-10-CM | POA: Diagnosis not present

## 2022-11-23 DIAGNOSIS — R2681 Unsteadiness on feet: Secondary | ICD-10-CM | POA: Diagnosis not present

## 2022-11-23 NOTE — Telephone Encounter (Signed)
Pt called in today crying asking to speak w/ Kerri Mills about her appts. As per pt, its a lot of appts coming up and she can't keep up with them. She also mentioned, why does she has to come back twice for labs work?. She's available [phone].

## 2022-11-23 NOTE — Telephone Encounter (Signed)
Called patient and discussed with Dr Nicki Reaper. Moved out fasting labs to 3/28 and pushed f/u with Dr Nicki Reaper out until 4/1. Patient has echo scheduled on 3/12. She says that with all of her appts and doing therapy she was getting overwhelmed. Patient is aware of new appts. Will call if she needs anything.

## 2022-11-24 DIAGNOSIS — J17 Pneumonia in diseases classified elsewhere: Secondary | ICD-10-CM | POA: Diagnosis not present

## 2022-11-24 DIAGNOSIS — R2689 Other abnormalities of gait and mobility: Secondary | ICD-10-CM | POA: Diagnosis not present

## 2022-11-24 DIAGNOSIS — R2681 Unsteadiness on feet: Secondary | ICD-10-CM | POA: Diagnosis not present

## 2022-11-25 ENCOUNTER — Other Ambulatory Visit: Payer: Self-pay | Admitting: General Surgery

## 2022-11-25 DIAGNOSIS — Z1231 Encounter for screening mammogram for malignant neoplasm of breast: Secondary | ICD-10-CM

## 2022-11-28 ENCOUNTER — Other Ambulatory Visit (INDEPENDENT_AMBULATORY_CARE_PROVIDER_SITE_OTHER): Payer: PPO

## 2022-11-28 ENCOUNTER — Emergency Department: Payer: PPO

## 2022-11-28 ENCOUNTER — Other Ambulatory Visit: Payer: Self-pay

## 2022-11-28 ENCOUNTER — Inpatient Hospital Stay
Admission: EM | Admit: 2022-11-28 | Discharge: 2022-11-30 | DRG: 563 | Disposition: A | Discharge status: Home Health | Payer: PPO | Attending: Osteopathic Medicine | Admitting: Osteopathic Medicine

## 2022-11-28 ENCOUNTER — Encounter: Payer: Self-pay | Admitting: Emergency Medicine

## 2022-11-28 ENCOUNTER — Other Ambulatory Visit: Payer: PPO

## 2022-11-28 ENCOUNTER — Ambulatory Visit: Payer: PPO

## 2022-11-28 ENCOUNTER — Observation Stay: Payer: PPO

## 2022-11-28 DIAGNOSIS — M25521 Pain in right elbow: Secondary | ICD-10-CM | POA: Diagnosis not present

## 2022-11-28 DIAGNOSIS — J479 Bronchiectasis, uncomplicated: Secondary | ICD-10-CM | POA: Diagnosis present

## 2022-11-28 DIAGNOSIS — Z853 Personal history of malignant neoplasm of breast: Secondary | ICD-10-CM

## 2022-11-28 DIAGNOSIS — K219 Gastro-esophageal reflux disease without esophagitis: Secondary | ICD-10-CM | POA: Diagnosis present

## 2022-11-28 DIAGNOSIS — Z79899 Other long term (current) drug therapy: Secondary | ICD-10-CM

## 2022-11-28 DIAGNOSIS — S62101B Fracture of unspecified carpal bone, right wrist, initial encounter for open fracture: Secondary | ICD-10-CM | POA: Diagnosis not present

## 2022-11-28 DIAGNOSIS — Z801 Family history of malignant neoplasm of trachea, bronchus and lung: Secondary | ICD-10-CM | POA: Diagnosis not present

## 2022-11-28 DIAGNOSIS — G4733 Obstructive sleep apnea (adult) (pediatric): Secondary | ICD-10-CM | POA: Diagnosis present

## 2022-11-28 DIAGNOSIS — E039 Hypothyroidism, unspecified: Secondary | ICD-10-CM | POA: Diagnosis present

## 2022-11-28 DIAGNOSIS — Z880 Allergy status to penicillin: Secondary | ICD-10-CM | POA: Diagnosis not present

## 2022-11-28 DIAGNOSIS — R2681 Unsteadiness on feet: Secondary | ICD-10-CM | POA: Diagnosis not present

## 2022-11-28 DIAGNOSIS — Z85820 Personal history of malignant melanoma of skin: Secondary | ICD-10-CM | POA: Diagnosis not present

## 2022-11-28 DIAGNOSIS — R0601 Orthopnea: Secondary | ICD-10-CM | POA: Diagnosis present

## 2022-11-28 DIAGNOSIS — S52614A Nondisplaced fracture of right ulna styloid process, initial encounter for closed fracture: Secondary | ICD-10-CM | POA: Diagnosis not present

## 2022-11-28 DIAGNOSIS — W19XXXA Unspecified fall, initial encounter: Secondary | ICD-10-CM

## 2022-11-28 DIAGNOSIS — S6991XA Unspecified injury of right wrist, hand and finger(s), initial encounter: Secondary | ICD-10-CM | POA: Diagnosis not present

## 2022-11-28 DIAGNOSIS — S52601A Unspecified fracture of lower end of right ulna, initial encounter for closed fracture: Secondary | ICD-10-CM | POA: Diagnosis present

## 2022-11-28 DIAGNOSIS — R2689 Other abnormalities of gait and mobility: Secondary | ICD-10-CM | POA: Diagnosis not present

## 2022-11-28 DIAGNOSIS — F32A Depression, unspecified: Secondary | ICD-10-CM | POA: Diagnosis present

## 2022-11-28 DIAGNOSIS — I10 Essential (primary) hypertension: Secondary | ICD-10-CM | POA: Diagnosis present

## 2022-11-28 DIAGNOSIS — Z888 Allergy status to other drugs, medicaments and biological substances status: Secondary | ICD-10-CM

## 2022-11-28 DIAGNOSIS — S52251A Displaced comminuted fracture of shaft of ulna, right arm, initial encounter for closed fracture: Secondary | ICD-10-CM | POA: Diagnosis not present

## 2022-11-28 DIAGNOSIS — Z85850 Personal history of malignant neoplasm of thyroid: Secondary | ICD-10-CM | POA: Diagnosis not present

## 2022-11-28 DIAGNOSIS — Z79890 Hormone replacement therapy (postmenopausal): Secondary | ICD-10-CM

## 2022-11-28 DIAGNOSIS — I700 Atherosclerosis of aorta: Secondary | ICD-10-CM | POA: Diagnosis present

## 2022-11-28 DIAGNOSIS — F419 Anxiety disorder, unspecified: Secondary | ICD-10-CM | POA: Diagnosis present

## 2022-11-28 DIAGNOSIS — Z8249 Family history of ischemic heart disease and other diseases of the circulatory system: Secondary | ICD-10-CM

## 2022-11-28 DIAGNOSIS — R739 Hyperglycemia, unspecified: Secondary | ICD-10-CM

## 2022-11-28 DIAGNOSIS — R0989 Other specified symptoms and signs involving the circulatory and respiratory systems: Secondary | ICD-10-CM | POA: Diagnosis present

## 2022-11-28 DIAGNOSIS — S5291XA Unspecified fracture of right forearm, initial encounter for closed fracture: Secondary | ICD-10-CM | POA: Diagnosis not present

## 2022-11-28 DIAGNOSIS — I4819 Other persistent atrial fibrillation: Secondary | ICD-10-CM | POA: Diagnosis present

## 2022-11-28 DIAGNOSIS — S52501A Unspecified fracture of the lower end of right radius, initial encounter for closed fracture: Secondary | ICD-10-CM | POA: Diagnosis present

## 2022-11-28 DIAGNOSIS — S62101A Fracture of unspecified carpal bone, right wrist, initial encounter for closed fracture: Secondary | ICD-10-CM | POA: Diagnosis present

## 2022-11-28 DIAGNOSIS — I495 Sick sinus syndrome: Secondary | ICD-10-CM | POA: Diagnosis present

## 2022-11-28 DIAGNOSIS — W010XXA Fall on same level from slipping, tripping and stumbling without subsequent striking against object, initial encounter: Secondary | ICD-10-CM | POA: Diagnosis present

## 2022-11-28 DIAGNOSIS — Z7901 Long term (current) use of anticoagulants: Secondary | ICD-10-CM

## 2022-11-28 DIAGNOSIS — Z803 Family history of malignant neoplasm of breast: Secondary | ICD-10-CM | POA: Diagnosis not present

## 2022-11-28 DIAGNOSIS — J17 Pneumonia in diseases classified elsewhere: Secondary | ICD-10-CM | POA: Diagnosis not present

## 2022-11-28 DIAGNOSIS — M130 Polyarthritis, unspecified: Secondary | ICD-10-CM | POA: Diagnosis not present

## 2022-11-28 DIAGNOSIS — S0990XA Unspecified injury of head, initial encounter: Secondary | ICD-10-CM | POA: Diagnosis not present

## 2022-11-28 DIAGNOSIS — E7800 Pure hypercholesterolemia, unspecified: Secondary | ICD-10-CM | POA: Diagnosis present

## 2022-11-28 DIAGNOSIS — Y929 Unspecified place or not applicable: Secondary | ICD-10-CM | POA: Diagnosis not present

## 2022-11-28 DIAGNOSIS — Z950 Presence of cardiac pacemaker: Secondary | ICD-10-CM

## 2022-11-28 DIAGNOSIS — S62101D Fracture of unspecified carpal bone, right wrist, subsequent encounter for fracture with routine healing: Secondary | ICD-10-CM | POA: Diagnosis not present

## 2022-11-28 DIAGNOSIS — R944 Abnormal results of kidney function studies: Secondary | ICD-10-CM | POA: Diagnosis not present

## 2022-11-28 DIAGNOSIS — M6259 Muscle wasting and atrophy, not elsewhere classified, multiple sites: Secondary | ICD-10-CM | POA: Diagnosis not present

## 2022-11-28 DIAGNOSIS — S52351A Displaced comminuted fracture of shaft of radius, right arm, initial encounter for closed fracture: Secondary | ICD-10-CM | POA: Diagnosis not present

## 2022-11-28 DIAGNOSIS — S199XXA Unspecified injury of neck, initial encounter: Secondary | ICD-10-CM | POA: Diagnosis not present

## 2022-11-28 LAB — TSH: TSH: 3.17 u[IU]/mL (ref 0.35–5.50)

## 2022-11-28 LAB — LIPID PANEL
Cholesterol: 177 mg/dL (ref 0–200)
HDL: 42.3 mg/dL (ref >39.00)
LDL Cholesterol: 99 mg/dL (ref 0–99)
NonHDL: 134.77
Total CHOL/HDL Ratio: 4
Triglycerides: 177 mg/dL — ABNORMAL HIGH (ref 0.0–149.0)
VLDL: 35.4 mg/dL (ref 0.0–40.0)

## 2022-11-28 LAB — CBC WITH DIFFERENTIAL/PLATELET
Abs Immature Granulocytes: 0.07 10*3/uL (ref 0.00–0.07)
Basophils Absolute: 0.1 10*3/uL (ref 0.0–0.1)
Basophils Relative: 1 %
Eosinophils Absolute: 0.1 10*3/uL (ref 0.0–0.5)
Eosinophils Relative: 2 %
HCT: 44.7 % (ref 36.0–46.0)
Hemoglobin: 14.1 g/dL (ref 12.0–15.0)
Immature Granulocytes: 1 %
Lymphocytes Relative: 19 %
Lymphs Abs: 1.6 10*3/uL (ref 0.7–4.0)
MCH: 30.9 pg (ref 26.0–34.0)
MCHC: 31.5 g/dL (ref 30.0–36.0)
MCV: 97.8 fL (ref 80.0–100.0)
Monocytes Absolute: 0.9 10*3/uL (ref 0.1–1.0)
Monocytes Relative: 11 %
Neutro Abs: 5.8 10*3/uL (ref 1.7–7.7)
Neutrophils Relative %: 66 %
Platelets: 237 10*3/uL (ref 150–400)
RBC: 4.57 MIL/uL (ref 3.87–5.11)
RDW: 12.9 % (ref 11.5–15.5)
WBC: 8.7 10*3/uL (ref 4.0–10.5)
nRBC: 0 % (ref 0.0–0.2)

## 2022-11-28 LAB — BASIC METABOLIC PANEL
Anion gap: 11 (ref 5–15)
BUN: 20 mg/dL (ref 6–23)
BUN: 22 mg/dL (ref 8–23)
CO2: 32 mEq/L (ref 19–32)
CO2: 26 mmol/L (ref 22–32)
Calcium: 10.4 mg/dL (ref 8.4–10.5)
Calcium: 9.7 mg/dL (ref 8.9–10.3)
Chloride: 99 mEq/L (ref 96–112)
Chloride: 100 mmol/L (ref 98–111)
Creatinine, Ser: 1.09 mg/dL (ref 0.40–1.20)
Creatinine, Ser: 0.92 mg/dL (ref 0.44–1.00)
GFR, Estimated: 60 mL/min — ABNORMAL LOW (ref >60)
GFR: 45.07 mL/min — ABNORMAL LOW (ref >60.00)
Glucose, Bld: 147 mg/dL — ABNORMAL HIGH (ref 70–99)
Glucose, Bld: 128 mg/dL — ABNORMAL HIGH (ref 70–99)
Potassium: 5 mEq/L (ref 3.5–5.1)
Potassium: 4.6 mmol/L (ref 3.5–5.1)
Sodium: 140 mEq/L (ref 135–145)
Sodium: 137 mmol/L (ref 135–145)

## 2022-11-28 LAB — HEPATIC FUNCTION PANEL
ALT: 21 U/L (ref 0–35)
AST: 23 U/L (ref 0–37)
Albumin: 3.7 g/dL (ref 3.5–5.2)
Alkaline Phosphatase: 125 U/L — ABNORMAL HIGH (ref 39–117)
Bilirubin, Direct: 0.1 mg/dL (ref 0.0–0.3)
Total Bilirubin: 0.5 mg/dL (ref 0.2–1.2)
Total Protein: 6.5 g/dL (ref 6.0–8.3)

## 2022-11-28 LAB — HEMOGLOBIN A1C: Hgb A1c MFr Bld: 6.5 % (ref 4.6–6.5)

## 2022-11-28 MED ORDER — AMIODARONE HCL 200 MG PO TABS
200.0000 mg | ORAL_TABLET | Freq: Every day | ORAL | Status: DC
  Administered 2022-11-29 – 2022-11-30 (×2): 200 mg via ORAL
  Filled 2022-11-28 (×2): qty 1

## 2022-11-28 MED ORDER — MORPHINE SULFATE (PF) 2 MG/ML IV SOLN
2.0000 mg | INTRAVENOUS | Status: DC | PRN
  Administered 2022-11-28 – 2022-11-29 (×4): 2 mg via INTRAVENOUS
  Filled 2022-11-28 (×4): qty 1

## 2022-11-28 MED ORDER — FUROSEMIDE 40 MG PO TABS: 20.0000 mg | ORAL_TABLET | Freq: Every day | ORAL | Status: DC

## 2022-11-28 MED ORDER — DULOXETINE HCL 60 MG PO CPEP
60.0000 mg | ORAL_CAPSULE | Freq: Every day | ORAL | Status: DC
  Administered 2022-11-28 – 2022-11-29 (×2): 60 mg via ORAL
  Filled 2022-11-28 (×2): qty 1

## 2022-11-28 MED ORDER — PRAVASTATIN SODIUM 20 MG PO TABS
40.0000 mg | ORAL_TABLET | Freq: Every day | ORAL | Status: DC
  Administered 2022-11-28 – 2022-11-29 (×2): 40 mg via ORAL
  Filled 2022-11-28 (×2): qty 2

## 2022-11-28 MED ORDER — FENTANYL CITRATE PF 50 MCG/ML IJ SOSY
PREFILLED_SYRINGE | INTRAMUSCULAR | Status: AC
  Filled 2022-11-28: qty 1

## 2022-11-28 MED ORDER — OXYCODONE HCL 5 MG PO TABS
5.0000 mg | ORAL_TABLET | Freq: Once | ORAL | Status: AC
  Administered 2022-11-28: 5 mg via ORAL
  Filled 2022-11-28: qty 1

## 2022-11-28 MED ORDER — POLYETHYLENE GLYCOL 3350 17 G PO PACK
17.0000 g | PACK | Freq: Every day | ORAL | Status: DC
  Administered 2022-11-29 – 2022-11-30 (×2): 17 g via ORAL
  Filled 2022-11-28 (×2): qty 1

## 2022-11-28 MED ORDER — LIDOCAINE HCL 1 % IJ SOLN
30.0000 mL | Freq: Once | INTRAMUSCULAR | Status: AC
  Administered 2022-11-28: 30 mL
  Filled 2022-11-28: qty 30

## 2022-11-28 MED ORDER — PANTOPRAZOLE SODIUM 40 MG PO TBEC
40.0000 mg | DELAYED_RELEASE_TABLET | Freq: Every day | ORAL | Status: DC
  Administered 2022-11-28 – 2022-11-30 (×3): 40 mg via ORAL
  Filled 2022-11-28 (×3): qty 1

## 2022-11-28 MED ORDER — CEFAZOLIN SODIUM-DEXTROSE 2-4 GM/100ML-% IV SOLN
2.0000 g | Freq: Three times a day (TID) | INTRAVENOUS | Status: DC
  Administered 2022-11-28 – 2022-11-30 (×6): 2 g via INTRAVENOUS
  Filled 2022-11-28 (×7): qty 100

## 2022-11-28 MED ORDER — SPIRONOLACTONE 25 MG PO TABS
25.0000 mg | ORAL_TABLET | Freq: Every day | ORAL | Status: DC
  Administered 2022-11-29 – 2022-11-30 (×2): 25 mg via ORAL
  Filled 2022-11-28 (×2): qty 1

## 2022-11-28 MED ORDER — FENTANYL CITRATE PF 50 MCG/ML IJ SOSY
50.0000 ug | PREFILLED_SYRINGE | Freq: Once | INTRAMUSCULAR | Status: AC
  Administered 2022-11-28: 50 ug via INTRAVENOUS
  Filled 2022-11-28: qty 1

## 2022-11-28 MED ORDER — LEVOTHYROXINE SODIUM 50 MCG PO TABS
125.0000 ug | ORAL_TABLET | Freq: Every day | ORAL | Status: DC
  Administered 2022-11-29 – 2022-11-30 (×2): 125 ug via ORAL
  Filled 2022-11-28 (×3): qty 1

## 2022-11-28 MED ORDER — METOPROLOL TARTRATE 25 MG PO TABS
75.0000 mg | ORAL_TABLET | Freq: Two times a day (BID) | ORAL | Status: DC
  Administered 2022-11-28 – 2022-11-30 (×4): 75 mg via ORAL
  Filled 2022-11-28 (×4): qty 3

## 2022-11-28 MED ORDER — VITAMIN D 25 MCG (1000 UNIT) PO TABS
2000.0000 [IU] | ORAL_TABLET | Freq: Every day | ORAL | Status: DC
  Administered 2022-11-29 – 2022-11-30 (×2): 2000 [IU] via ORAL
  Filled 2022-11-28 (×2): qty 2

## 2022-11-28 NOTE — Consult Note (Signed)
ORTHOPAEDIC CONSULTATION  REQUESTING PHYSICIAN: Verline Lema, MD  Chief Complaint: Right wrist pain status post fall  HPI: Kerri Mills is a 89 y.o. right-hand-dominant female who is seen in the ER this evening with her daughter at the bedside.  Patient fell onto her right outstretched hand after tripping over a wheelchair when she tried to help another resident off of the elevator at the cystic facility where she [facility].  Patient had immediate pain and deformity of the right wrist and was brought to [facility] emergency department by EMS.  Patient had x-rays taken of her right wrist which demonstrated a fracture of the distal radius and ulna.  The patient was noted by Dr. Lucillie Garfinkel, the ER physician, to have a small skin tear over the ulnar side of her wrist.  A closed reduction was performed by Dr. Jacelyn Grip.  She has received IV Ancef.  Patient has been using a walker since January for safety when she is out of her apartment.  Patient explains that she had severe pain upon presentation but her pain is now better controlled following reduction and splinting.  Patient has mild paresthesias in her right thumb but denies numbness in her index through small fingers.  Past Medical History:  Diagnosis Date   Allergy    Anxiety    Arthritis    Atypical chest pain    a. 10/2018 MV: small, fixed apical defect possibly 2/2 attenuation artifact. No ischemia.  EF 68%.   Clotting disorder ([place])    Colitis    Depression    Diverticulitis 2013   Dysrhythmia    Gastric ulcer    GERD (gastroesophageal reflux disease)    History of echocardiogram    a. 09/2019 Echo: EF 55-60%, mod LVH. Mildly dil LA. Triv MR/TR.   Hypercholesterolemia    Hypertension    Hypothyroidism    Infiltrating lobular carcinoma of left breast 2011   T2,N0, ER: 90%; PR 0%; Her 2 neu not amplified. [facility]).   Melanoma ([place]) 1997   Melanoma in situ of upper extremity ([place]) 03/19/2011   Persistent atrial  fibrillation ([place])    a. CHADS2VASc => 4 (HTN, age x 2, female)   Personal history of radiation therapy 2011   BREAST CA   Presence of permanent cardiac pacemaker    Seroma    HISTORY OF LFT BREAST   Sleep apnea    Thyroid cancer ([place]) 1992   Past Surgical History:  Procedure Laterality Date   ABDOMINAL HYSTERECTOMY  1973   partial   BREAST BIOPSY Left 02-13-13   BENIGN BREAST TISSUE WITH CHANGES CONSISTENT WITH FAT NECROSIS   BREAST BIOPSY Left 01/21/2015   bx done in [facility] office 11:00 left 6-8cmfn   BREAST EXCISIONAL BIOPSY Left 1995   neg   BREAST EXCISIONAL BIOPSY Left 2011   Breast cancer radiation   BREAST LUMPECTOMY Left 2011   BREAST CA   CARDIAC CATHETERIZATION     CHOLECYSTECTOMY     COLONOSCOPY  2013   COLONOSCOPY WITH PROPOFOL N/A 06/09/2021   Procedure: COLONOSCOPY WITH PROPOFOL;  Surgeon: Jonathon Bellows, MD;  Location: [facility] ENDOSCOPY;  Service: Gastroenterology;  Laterality: N/A;   HAMMER TOE SURGERY Bilateral 02/24/2022   Procedure: HAMMER TOE CORRECTION 2, 3, 4 Right and 2nd Left;  Surgeon: Edrick Kins, DPM;  Location: [facility] ORS;  Service: Podiatry;  Laterality: Bilateral;   MELANOMA EXCISION     RT UPPER ARM   PACEMAKER IMPLANT N/A 01/10/2020  Procedure: PACEMAKER IMPLANT;  Surgeon: Constance Haw, MD;  Location: [place] [facility];  Service: Cardiovascular;  Laterality: N/A;   PARTIAL HYSTERECTOMY     bleeding, ovaries in place.     THYROID SURGERY  1992   FOR THYROID CANCER   TONSILLECTOMY     Social History   Socioeconomic History   Marital status: Widowed    Spouse name: Not on file   Number of children: Not on file   Years of education: Not on file   Highest education level: Not on file  Occupational History   Not on file  Tobacco Use   Smoking status: Never   Smokeless tobacco: Never   Tobacco comments:    never  Vaping Use   Vaping Use: Never used  Substance and Sexual Activity   Alcohol use: Yes    Comment: once in a while   Drug  use: No   Sexual activity: Never  Other Topics Concern   Not on file  Social History Narrative   Independent and baseline. Lives by herself   Social Determinants of Health   Financial Resource Strain: Low Risk  (02/08/2022)   Overall Financial Resource Strain (CARDIA)    Difficulty of Paying Living Expenses: Not hard at all  Food Insecurity: No Food Insecurity (10/17/2022)   Hunger Vital Sign    Worried About Running Out of Food in the Last Year: Never true    Ran Out of Food in the Last Year: Never true  Transportation Needs: No Transportation Needs (10/17/2022)   PRAPARE - Hydrologist (Medical): No    Lack of Transportation (Non-Medical): No  Physical Activity: Sufficiently Active (02/08/2022)   Exercise Vital Sign    Days of Exercise per Week: 4 days    Minutes of Exercise per Session: 40 min  Stress: No Stress Concern Present (02/08/2022)   [place]    Feeling of Stress : Only a little  Social Connections: Unknown (02/08/2022)   Social Connection and Isolation Panel [NHANES]    Frequency of Communication with Friends and Family: More than three times a week    Frequency of Social Gatherings with Friends and Family: More than three times a week    Attends Religious Services: Not on file    Active Member of Clubs or Organizations: Not on file    Attends Archivist Meetings: Not on file    Marital Status: Widowed   Family History  Problem Relation Age of Onset   Heart disease Mother    Cancer Brother        lung    Cancer Sister        breast   Breast cancer Neg Hx    Allergies  Allergen Reactions   Lipitor [Atorvastatin Calcium] Other (See Comments)    Stiffness & soreness   Penicillins Rash   Prior to Admission medications   Medication Sig Start Date End Date Taking? Authorizing Provider  acetaminophen (TYLENOL) 650 MG CR tablet Take 650 mg by mouth every 8  (eight) hours as needed for pain.    [provider]  albuterol (VENTOLIN HFA) 108 (90 Base) MCG/ACT inhaler Inhale 2 puffs into the lungs every 6 (six) hours as needed for wheezing or shortness of breath. 11/09/22   Einar Pheasant, MD  amiodarone (PACERONE) 200 MG tablet TAKE 1 TABLET BY MOUTH DAILY 10/03/22   Camnitz, Ocie Doyne, MD  azelastine (ASTELIN)  0.1 % nasal spray Place 1 spray into both nostrils 2 (two) times daily. Use in each nostril as directed 09/21/21   Einar Pheasant, MD  Budeson-Glycopyrrol-Formoterol (BREZTRI AEROSPHERE) 160-9-4.8 MCG/ACT AERO Inhale 2 puffs into the lungs in the morning and at bedtime. 11/22/22   Armando Reichert, MD  cetirizine (ZYRTEC) 5 MG tablet Take 1 tablet (5 mg total) by mouth daily. 11/22/22   Armando Reichert, MD  cholecalciferol (VITAMIN D3) 25 MCG (1000 UNIT) tablet Take 2,000 Units by mouth daily.    [provider]  DULoxetine (CYMBALTA) 60 MG capsule TAKE ONE CAPSULE AT BEDTIME 08/15/22   Einar Pheasant, MD  ELIQUIS 5 MG TABS tablet TAKE ONE TABLET BY MOUTH TWICE DAILY 10/10/22   Minna Merritts, MD  furosemide (LASIX) 20 MG tablet Take 2 tablets by mouth daily X's 3 days then reduce to 2 tablets by mouth on Mondays & Fridays 11/16/22   Shirley Friar, PA-C  gabapentin (NEURONTIN) 300 MG capsule TAKE 2 CAPSULES BY MOUTH AT BEDTIME 06/15/22   Einar Pheasant, MD  levothyroxine (SYNTHROID) 125 MCG tablet Take 1 tablet (125 mcg total) by mouth daily. 11/10/22   Einar Pheasant, MD  lovastatin (MEVACOR) 40 MG tablet TAKE 1 TABLET BY MOUTH DAILY 03/11/22   Crecencio Mc, MD  metoprolol tartrate 75 MG TABS Take 1 tablet (75 mg total) by mouth 2 (two) times daily. 10/14/22   Hollice Gong, Mir M, MD  Multiple Vitamins-Minerals (PRESERVISION AREDS 2+MULTI VIT PO) Take 1 tablet by mouth 2 (two) times daily.    [provider]  omeprazole (PRILOSEC) 20 MG capsule TAKE 1 CAPSULE BY MOUTH ONCE DAILY 08/17/22   Einar Pheasant, MD   Polyethyl Glycol-Propyl Glycol (SYSTANE OP) Place 1 drop into both eyes daily as needed (for dry eyes).    [provider]  polyethylene glycol powder (GLYCOLAX/MIRALAX) 17 GM/SCOOP powder Take 17 g by mouth daily. 09/10/20   Einar Pheasant, MD  potassium chloride (KLOR-CON M) 10 MEQ tablet Take 2 tablets by mouth daily X's 3 days then reduce to 2 tablets by mouth on Mondays & Fridays 11/16/22   Shirley Friar, PA-C  Probiotic Product (ALIGN) 4 MG CAPS One capsule daily while on antibiotic and for two weeks after complete antibiotic 01/12/22   Einar Pheasant, MD  spironolactone (ALDACTONE) 25 MG tablet Take 1 tablet (25 mg total) by mouth daily. 11/16/22   Shirley Friar, PA-C  triamcinolone cream (KENALOG) 0.1 % Apply 1 application. topically 2 (two) times daily. 02/16/22   Einar Pheasant, MD  White Petrolatum-Mineral Oil (GENTEAL TEARS NIGHT-TIME OP) Place 1 application into both eyes at bedtime. Night time ointment 3.5g    [provider]   DG Wrist 2 Views Right  Result Date: 11/28/2022 CLINICAL DATA:  Distal radial and ulnar fracture reduction EXAM: RIGHT WRIST - 2 VIEW COMPARISON:  11/28/2022 FINDINGS: Fiberglass splint obscures bony detail. Displaced Colles fracture is again noted with currently about 1.3 cm posterior displacement of the distal radial fragment with respect to the proximal, improved from previous 16 mm and previous overlap. Reduced apex volar angulation. Improved alignment at the distal ulnar metaphysis oblique fracture site. IMPRESSION: 1. Mild improved alignment at the distal radial fracture although there still 1.3 cm posterior displacement of the distal fragment with respect to the proximal. Improved alignment at the distal ulnar fracture site. Electronically Signed   By: Van Clines M.D.   On: 11/28/2022 18:09   CT Head Wo Contrast  Result Date:  11/28/2022 CLINICAL DATA:  Head trauma, minor (Age >= 65y); Neck trauma (Age >= 65y)  EXAM: CT HEAD WITHOUT CONTRAST CT CERVICAL SPINE WITHOUT CONTRAST TECHNIQUE: Multidetector CT imaging of the head and cervical spine was performed following the standard protocol without intravenous contrast. Multiplanar CT image reconstructions of the cervical spine were also generated. RADIATION DOSE REDUCTION: This exam was performed according to the departmental dose-optimization program which includes automated exposure control, adjustment of the mA and/or kV according to patient size and/or use of iterative reconstruction technique. COMPARISON:  January 08, 2020. FINDINGS: CT HEAD FINDINGS Brain: No evidence of acute infarction, hemorrhage, hydrocephalus, extra-axial collection or mass lesion/mass effect. Vascular: No hyperdense vessel identified. Skull: No fracture. Sinuses/Orbits: Clear sinuses.  No acute orbital findings. Other: No mastoid effusions. CT CERVICAL SPINE FINDINGS Alignment: Similar alignment in comparison to the prior. No new sagittal subluxation. Skull base and vertebrae: Vertebral body heights maintained. No evidence of acute fracture. Soft tissues and spinal canal: No prevertebral fluid or swelling. No visible canal hematoma. Disc levels: Similar severe multilevel degenerative change including facet uncovertebral hypertrophy with varying degrees of neural foraminal stenosis. Upper chest: Visualized lung apices are clear. IMPRESSION: No acute intracranial or cervical spine findings. Electronically Signed   By: Margaretha Sheffield M.D.   On: 11/28/2022 16:08   CT Cervical Spine Wo Contrast  Result Date: 11/28/2022 CLINICAL DATA:  Head trauma, minor (Age >= 65y); Neck trauma (Age >= 65y) EXAM: CT HEAD WITHOUT CONTRAST CT CERVICAL SPINE WITHOUT CONTRAST TECHNIQUE: Multidetector CT imaging of the head and cervical spine was performed following the standard protocol without intravenous contrast. Multiplanar CT image reconstructions of the cervical spine were also generated. RADIATION DOSE  REDUCTION: This exam was performed according to the departmental dose-optimization program which includes automated exposure control, adjustment of the mA and/or kV according to patient size and/or use of iterative reconstruction technique. COMPARISON:  January 08, 2020. FINDINGS: CT HEAD FINDINGS Brain: No evidence of acute infarction, hemorrhage, hydrocephalus, extra-axial collection or mass lesion/mass effect. Vascular: No hyperdense vessel identified. Skull: No fracture. Sinuses/Orbits: Clear sinuses.  No acute orbital findings. Other: No mastoid effusions. CT CERVICAL SPINE FINDINGS Alignment: Similar alignment in comparison to the prior. No new sagittal subluxation. Skull base and vertebrae: Vertebral body heights maintained. No evidence of acute fracture. Soft tissues and spinal canal: No prevertebral fluid or swelling. No visible canal hematoma. Disc levels: Similar severe multilevel degenerative change including facet uncovertebral hypertrophy with varying degrees of neural foraminal stenosis. Upper chest: Visualized lung apices are clear. IMPRESSION: No acute intracranial or cervical spine findings. Electronically Signed   By: Margaretha Sheffield M.D.   On: 11/28/2022 16:08   DG Elbow 2 Views Right  Result Date: 11/28/2022 CLINICAL DATA:  Fall, pain EXAM: RIGHT ELBOW - 2 VIEW COMPARISON:  None Available. FINDINGS: Suboptimal positioning on the lateral view, which limits assessment. Within this limitation, there is no evidence of fracture or dislocation. No large elbow joint effusion. No significant arthropathy. No focal soft tissue abnormality. IMPRESSION: Slightly limited exam without evidence of fracture or dislocation. Electronically Signed   By: Davina Poke D.O.   On: 11/28/2022 16:03   DG Wrist Complete Right  Result Date: 11/28/2022 CLINICAL DATA:  Trauma, fall EXAM: RIGHT WRIST - COMPLETE 3+ VIEW COMPARISON:  05/19/2011 FINDINGS: There is a comminuted fracture in the distal metaphysis and  distal end of ulna. Fracture line is involving the base of the ulnar styloid. There is angulation at the fracture site. There is  comminuted displaced fracture in the distal radius. There is dorsal displacement of distal fracture fragment along with the carpals in relation to the distal shaft of radius. Degenerative changes are noted in multiple joints, more so in the interphalangeal joint of the right thumb. IMPRESSION: Comminuted, displaced fractures are seen in distal right radius and ulna. Electronically Signed   By: Elmer Picker M.D.   On: 11/28/2022 14:57    Positive ROS: All other systems have been reviewed and were otherwise negative with the exception of those mentioned in the HPI and as above.  Physical Exam: General: Alert, no acute distress  MUSCULOSKELETAL: Patient is currently in a sugar-tong splint.  [name] shows me photos of the wrist prior to reduction which demonstrate diffuse ecchymosis and swelling around the right wrist radial deviation of the fracture.  Patient's fingers are well-perfused.  She can flex and extend all 5 digits of her right hand.  Patient has intact sensation to light touch in all 5 digits but has paresthesias in the right thumb.  Assessment: Right distal both bone forearm fracture status post fall  Plan: I discussed this fracture with Dr. Jacelyn Grip earlier today when he paged me from the ER.  Informed the patient has a small skin tear over the ulnar side of her right wrist.  I recommend that the patient be treated with IV antibiotics and she has already received IV Kefzol.  He performed a closed reduction of the right wrist and there is improvement in the alignment of the both bone forearm fracture.  I met with the patient and her daughter in the ER.  I had a long discussion regarding the patient's fracture.  We discussed operative versus nonoperative treatment.  Given the patient's significant swelling and ecchymosis I would recommend waiting on  surgical intervention until her swelling improves.  There is no neurovascular deficits in the right hand other than mild paresthesias in the right thumb.  Patient has full flexion extension of all her digits of the right hand.  I am going to order a CT scan of the right wrist to further evaluate the fracture.  This will help to determine the degree of comminution, displacement and whether there is intra-articular extension.  This will help in determining the best course of treatment.  Patient is being admitted to the hospital service for pain control.  I recommend physical and Occupational Therapy evaluation.  The patient will need a platform attachment to her walker.  The since daughter states she will bring in the walker tomorrow so a platform attachment can be installed.  She understands that with or without surgery the fracture will take approximately 6 weeks to heal.  In that timeframe she will not be able to push pull lift or weight-bear with the right wrist or hand.  Patient will continue on IV antibiotics while she is an inpatient.  There is no gross contamination to the wound as she fell indoors at her facility.  She will be discharged on Bactrim and Keflex until follow-up my office 1 week for reevaluation and x-ray.   Thornton Park, MD    11/28/2022 6:47 PM

## 2022-11-28 NOTE — ED Triage Notes (Signed)
Patient to ED via [facility] from [facility]. Patient got her foot caught in a wheel chair. Right wrist deformity per EMS. Denies hitting head or LOC.

## 2022-11-28 NOTE — ED Provider Notes (Signed)
[facility] Provider Note    Event Date/Time   First MD Initiated Contact with Patient 11/28/22 1501     (approximate)   History   Fall   HPI  Kerri Mills is a 89 y.o. female   Past medical history of atrial fibrillation on Eliquis, difficulty with ambulation walker, hypertension hyperlipidemia presents to the emergency department with mechanical slip and fall with a right wrist injury.  She was helping a friend out of the elevator and was not using her walker when her foot struck the side of her friend's wheelchair and she fell forward on outstretched arm to her right side injuring her right wrist and elbow.  Does not recall head strike or loss of consciousness.  She is on Eliquis.   External Medical Documents Reviewed: Outpatient pulmonology note from 11/22/2022 where they addressed her recent pneumonia      Physical Exam   Triage Vital Signs: ED Triage Vitals  Enc Vitals Group     BP 11/28/22 1423 137/77     Pulse Rate 11/28/22 1423 82     Resp 11/28/22 1423 18     Temp 11/28/22 1423 98.1 F (36.7 C)     Temp Source 11/28/22 1423 Oral     SpO2 11/28/22 1423 92 %     Weight 11/28/22 1455 170 lb 12.7 oz (77.5 kg)     Height 11/28/22 1455 '5\' 5"'$  (1.651 m)     Head Circumference --      Peak Flow --      Pain Score 11/28/22 1421 10     Pain Loc --      Pain Edu? --      Excl. in [place]? --     Most recent vital signs: Vitals:   11/28/22 1423  BP: 137/77  Pulse: 82  Resp: 18  Temp: 98.1 F (36.7 C)  SpO2: 92%    General: Awake, no distress.  CV:  Good peripheral perfusion.  Resp:  Normal effort.  Abd:  No distention.  Other:  Appears to be in pain with an obvious deformity to the right wrist and a small area of broken skin with no visible bone, some underlying skin tenting, to the ulnar side of the right wrist.  Neurovascular intact in that she has strong radial pulse but she states that there is pain with ranging of all of her fingers  though she is able to gently move each finger, and has sensation to all fingers.  Tenderness to palpation of the right elbow with no overlying signs of injury, remainder of secondary survey reveals no signs of external trauma or tenderness to palpation she is ranging at the hips bilaterally with full active range of motion.   ED Results / Procedures / Treatments   Labs (all labs ordered are listed, but only abnormal results are displayed) Labs Reviewed  BASIC METABOLIC PANEL - Abnormal; Notable for the following components:      Result Value   Glucose, Bld 128 (*)    GFR, Estimated 60 (*)    All other components within normal limits  CBC WITH DIFFERENTIAL/PLATELET     I ordered and reviewed the above labs they are notable for normal H&H and lytes    RADIOLOGY I independently reviewed and interpreted right x-ray of the wrist and see a fracture of the distal ulnar and radius   PROCEDURES:  Critical Care performed: No  .Ortho Injury Treatment  Date/Time: 11/28/2022 5:42 PM  Performed by: Lucillie Garfinkel, MD Authorized by: Lucillie Garfinkel, MD   Consent:    Consent obtained:  Verbal   Consent given by:  Patient   Risks discussed:  Fracture   Alternatives discussed:  No treatment, alternative treatment, immobilization and delayed treatmentInjury location: wrist Location details: right wrist Injury type: fracture Fracture type: distal radius and ulnar styloid Pre-procedure neurovascular assessment: neurovascularly intact Pre-procedure distal perfusion: normal Pre-procedure neurological function: normal Pre-procedure range of motion: normal Anesthesia: hematoma block  Anesthesia: Local anesthesia used: yes Local Anesthetic: lidocaine 1% without epinephrine  Patient sedated: NoManipulation performed: yes Skeletal traction used: yes X-ray confirmed reduction: yes Immobilization: splint Splint type: sugar tong Splint Applied by: ED Provider Supplies used: cotton padding and  Ortho-Glass Post-procedure neurovascular assessment: post-procedure neurovascularly intact Post-procedure distal perfusion: normal Post-procedure neurological function: normal Post-procedure range of motion: normal      MEDICATIONS ORDERED IN ED: Medications  ceFAZolin (ANCEF) IVPB 2g/100 mL premix (0 g Intravenous Stopped 11/28/22 1620)  fentaNYL (SUBLIMAZE) injection 50 mcg (50 mcg Intravenous Given 11/28/22 1529)  lidocaine (XYLOCAINE) 1 % (with pres) injection 30 mL (30 mLs Other Given by Other 11/28/22 1530)  fentaNYL (SUBLIMAZE) 50 MCG/ML injection (  Given 11/28/22 1652)  fentaNYL (SUBLIMAZE) 50 MCG/ML injection (  Given 11/28/22 1737)    External physician / consultants:  I spoke with Dr. Mack Guise of orthopedics regarding care plan for this patient.   IMPRESSION / MDM / ASSESSMENT AND PLAN / ED COURSE  I reviewed the triage vital signs and the nursing notes.                                Patient's presentation is most consistent with acute presentation with potential threat to life or bodily function.  Differential diagnosis includes, but is not limited to, right wrist fracture or dislocation, elbow fracture or dislocation, blunt traumatic injury including intracranial bleeding or C-spine fracture dislocation considered but less likely intrathoracic or intra-abdominal injury, T or L-spine injury pelvis or hip   MDM: Discussed the open fracture of the wrist with orthopedics, plan is for cefazolin while inpatient and then transition to oral Keflex for follow-up in orthopedic clinic.  Reduction as above see procedure note.  The patient tolerated the reduction well with hematoma block and IV fentanyl.  Postreduction x-ray pending.  Splinted in the ED.  Given her baseline ambulation status using walker and no longer able to weight-bear on her right arm, will admit for PT/rehab placement.      Plan will be for cefazolin while inpatient ordered for every 8 hours.  And then when she  is able to be discharged she should be put on oral Keflex, per Dr. Christia Reading of orthopedics. F/u w ortho upon dc        FINAL CLINICAL IMPRESSION(S) / ED DIAGNOSES   Final diagnoses:  Right wrist fracture, open, initial encounter  Fall, initial encounter     Rx / DC Orders   ED Discharge Orders     None        Note:  This document was prepared using Dragon voice recognition software and may include unintentional dictation errors.    Lucillie Garfinkel, MD 11/28/22 [phone]

## 2022-11-28 NOTE — Progress Notes (Signed)
       CROSS COVER NOTE  NAME: ENRIKA KATTNER MRN: [id number] DOB : 04/13/1931 ATTENDING PHYSICIAN: Verline Lema, MD    Date of Service   11/28/2022   HPI/Events of Note   Message received from pharmacy requesting ordered Lasix be changed to PRN to match outpatient use.  Ordered PO Lasix as needed up to 3 times per week. Per outpatient notes patient had not felt like she needed lasix at the February visit.  Interventions   Assessment/Plan: Lasix discontinued Diurese as clinically indicated      To reach the provider On-Call:   7AM- 7PM see care teams to locate the attending and reach out to them via [email or web address] Password: [email or web address] 7PM-7AM contact night-coverage If you still have difficulty reaching the appropriate provider, please page the [facility] (Director on Call) for [facility] on amion for assistance  This document was prepared using Systems analyst and may include unintentional dictation errors.  Neomia Glass DNP, MBA, FNP-BC, PMHNP-BC Nurse Practitioner [facility] Pager [phone]

## 2022-11-28 NOTE — ED Triage Notes (Addendum)
First RN note-  Pt BIB [facility] after getting her foot caught in a wheelchair. Pt has right wrist deformity.   Pt is from [place].  A&O x4 No loss of consciousness, no neck or back pain.   Pt ate lunch and no meds, as per EMS  EMS Vitals Bp 146/91 93% RA 78 HR RR 20

## 2022-11-28 NOTE — H&P (Signed)
History and Physical    Patient: Kerri Mills [id number] DOB: February 24, 1931 DOA: 11/28/2022 DOS: the patient was seen and examined on 11/28/2022 PCP: Einar Pheasant, MD  Patient coming from: ALF/ILF  Chief Complaint: Right wrist pain after a fall Chief Complaint  Patient presents with   Fall   HPI: Kerri Mills is a 89 y.o. female with medical history significant of tachybradycardia syndrome, persistent atrial fibrillation on Eliquis, bronchiectasis, hypertension, hyperlipidemia who presented to the hospital with complaints of right wrist pain after a fall that happened today .according to patient she was coming out of the elevator and she got her leg entangled and therefore falling and landing on her right hand .she immediately started feeling pain with intensity 10/10 associated with swelling around the wrist and therefore was brought to the emergency room for further management.  Patient was recently admitted here in July 2021 after she was found to be influenza A positive along with patchy opacities seen on CTA of the chest.   ED course: In the emergency room vitals were stable. X-ray of the right wrist showed Comminuted, displaced fractures are seen in distal right radius and ulna. Patient underwent manipulation and reduction in the emergency room by ED physician. ED physician discussed the case with [facility] surgeon Dr. Mack Guise. According to him patient would not need acute surgical intervention at this time but rather splinting and IV cefazolin every 8 hours and then discharge when stable on oral Keflex to follow-up as an outpatient. Given the concerns that patient will need adequate pain management as well as possible acute rehab placement hospitalist service was contacted to admit the patient for further management.  Review of Systems: Review of systems negative except as mentioned above  Past Medical History:  Diagnosis Date   Allergy    Anxiety    Arthritis    Atypical chest  pain    a. 10/2018 MV: small, fixed apical defect possibly 2/2 attenuation artifact. No ischemia.  EF 68%.   Clotting disorder ([place])    Colitis    Depression    Diverticulitis 2013   Dysrhythmia    Gastric ulcer    GERD (gastroesophageal reflux disease)    History of echocardiogram    a. 09/2019 Echo: EF 55-60%, mod LVH. Mildly dil LA. Triv MR/TR.   Hypercholesterolemia    Hypertension    Hypothyroidism    Infiltrating lobular carcinoma of left breast 2011   T2,N0, ER: 90%; PR 0%; Her 2 neu not amplified. [facility]).   Melanoma ([place]) 1997   Melanoma in situ of upper extremity ([place]) 03/19/2011   Persistent atrial fibrillation ([place])    a. CHADS2VASc => 4 (HTN, age x 2, female)   Personal history of radiation therapy 2011   BREAST CA   Presence of permanent cardiac pacemaker    Seroma    HISTORY OF LFT BREAST   Sleep apnea    Thyroid cancer ([place]) 1992   Past Surgical History:  Procedure Laterality Date   ABDOMINAL HYSTERECTOMY  1973   partial   BREAST BIOPSY Left 02-13-13   BENIGN BREAST TISSUE WITH CHANGES CONSISTENT WITH FAT NECROSIS   BREAST BIOPSY Left 01/21/2015   bx done in [facility] office 11:00 left 6-8cmfn   BREAST EXCISIONAL BIOPSY Left 1995   neg   BREAST EXCISIONAL BIOPSY Left 2011   Breast cancer radiation   BREAST LUMPECTOMY Left 2011   BREAST CA   CARDIAC CATHETERIZATION     CHOLECYSTECTOMY  COLONOSCOPY  2013   COLONOSCOPY WITH PROPOFOL N/A 06/09/2021   Procedure: COLONOSCOPY WITH PROPOFOL;  Surgeon: Jonathon Bellows, MD;  [facility] ENDOSCOPY;  Service: Gastroenterology;  Laterality: N/A;   HAMMER TOE SURGERY Bilateral 02/24/2022   Procedure: HAMMER TOE CORRECTION 2, 3, 4 Right and 2nd Left;  Surgeon: Edrick Kins, DPM;  Location: [facility] ORS;  Service: Podiatry;  Laterality: Bilateral;   MELANOMA EXCISION     RT UPPER ARM   PACEMAKER IMPLANT N/A 01/10/2020   Procedure: PACEMAKER IMPLANT;  Surgeon: Constance Haw, MD;  Location: [place] CV  LAB;  Service: Cardiovascular;  Laterality: N/A;   PARTIAL HYSTERECTOMY     bleeding, ovaries in place.     THYROID SURGERY  1992   FOR THYROID CANCER   TONSILLECTOMY     Social History:  reports that she has never smoked. She has never used smokeless tobacco. She reports current alcohol use. She reports that she does not use drugs.  Allergies  Allergen Reactions   Lipitor [Atorvastatin Calcium] Other (See Comments)    Stiffness & soreness   Penicillins Rash    Family History  Problem Relation Age of Onset   Heart disease Mother    Cancer Brother        lung    Cancer Sister        breast   Breast cancer Neg Hx     Prior to Admission medications   Medication Sig Start Date End Date Taking? Authorizing Provider  acetaminophen (TYLENOL) 650 MG CR tablet Take 650 mg by mouth every 8 (eight) hours as needed for pain.    [provider]  albuterol (VENTOLIN HFA) 108 (90 Base) MCG/ACT inhaler Inhale 2 puffs into the lungs every 6 (six) hours as needed for wheezing or shortness of breath. 11/09/22   Einar Pheasant, MD  amiodarone (PACERONE) 200 MG tablet TAKE 1 TABLET BY MOUTH DAILY 10/03/22   Camnitz, Ocie Doyne, MD  azelastine (ASTELIN) 0.1 % nasal spray Place 1 spray into both nostrils 2 (two) times daily. Use in each nostril as directed 09/21/21   Einar Pheasant, MD  Budeson-Glycopyrrol-Formoterol (BREZTRI AEROSPHERE) 160-9-4.8 MCG/ACT AERO Inhale 2 puffs into the lungs in the morning and at bedtime. 11/22/22   Armando Reichert, MD  cetirizine (ZYRTEC) 5 MG tablet Take 1 tablet (5 mg total) by mouth daily. 11/22/22   Armando Reichert, MD  cholecalciferol (VITAMIN D3) 25 MCG (1000 UNIT) tablet Take 2,000 Units by mouth daily.    [provider]  DULoxetine (CYMBALTA) 60 MG capsule TAKE ONE CAPSULE AT BEDTIME 08/15/22   Einar Pheasant, MD  ELIQUIS 5 MG TABS tablet TAKE ONE TABLET BY MOUTH TWICE DAILY 10/10/22   Minna Merritts, MD  furosemide (LASIX) 20 MG tablet Take 2  tablets by mouth daily X's 3 days then reduce to 2 tablets by mouth on Mondays & Fridays 11/16/22   Shirley Friar, PA-C  gabapentin (NEURONTIN) 300 MG capsule TAKE 2 CAPSULES BY MOUTH AT BEDTIME 06/15/22   Einar Pheasant, MD  levothyroxine (SYNTHROID) 125 MCG tablet Take 1 tablet (125 mcg total) by mouth daily. 11/10/22   Einar Pheasant, MD  lovastatin (MEVACOR) 40 MG tablet TAKE 1 TABLET BY MOUTH DAILY 03/11/22   Crecencio Mc, MD  metoprolol tartrate 75 MG TABS Take 1 tablet (75 mg total) by mouth 2 (two) times daily. 10/14/22   Hollice Gong, Mir M, MD  Multiple Vitamins-Minerals (PRESERVISION AREDS 2+MULTI VIT PO) Take 1 tablet by mouth  2 (two) times daily.    [provider]  omeprazole (PRILOSEC) 20 MG capsule TAKE 1 CAPSULE BY MOUTH ONCE DAILY 08/17/22   Einar Pheasant, MD  Polyethyl Glycol-Propyl Glycol (SYSTANE OP) Place 1 drop into both eyes daily as needed (for dry eyes).    [provider]  polyethylene glycol powder (GLYCOLAX/MIRALAX) 17 GM/SCOOP powder Take 17 g by mouth daily. 09/10/20   Einar Pheasant, MD  potassium chloride (KLOR-CON M) 10 MEQ tablet Take 2 tablets by mouth daily X's 3 days then reduce to 2 tablets by mouth on Mondays & Fridays 11/16/22   Shirley Friar, PA-C  Probiotic Product (ALIGN) 4 MG CAPS One capsule daily while on antibiotic and for two weeks after complete antibiotic 01/12/22   Einar Pheasant, MD  spironolactone (ALDACTONE) 25 MG tablet Take 1 tablet (25 mg total) by mouth daily. 11/16/22   Shirley Friar, PA-C  triamcinolone cream (KENALOG) 0.1 % Apply 1 application. topically 2 (two) times daily. 02/16/22   Einar Pheasant, MD  White Petrolatum-Mineral Oil (GENTEAL TEARS NIGHT-TIME OP) Place 1 application into both eyes at bedtime. Night time ointment 3.5g    [provider]    Physical Exam: Vitals:   11/28/22 1423 11/28/22 1455 11/28/22 1821  BP: 137/77  134/78  Pulse: 82  80  Resp: 18  18  Temp:  98.1 F (36.7 C)  98.1 F (36.7 C)  TempSrc: Oral  Oral  SpO2: 92%  94%  Weight:  77.5 kg   Height:  '5\' 5"'$  (1.651 m)    General: In acute pain however appears as stated age Extremities: Right upper extremity splinted Cardiovascular: Heart sounds 1 and 2 heard no murmurs appreciated Respiratory: Normal breath sounds with no wheezes Abdomen: Full nondistended nontender CNS: Alert and oriented x 3   Data Reviewed: CBC as well as CMP results reviewed by me showing no abnormalities  Assessment and Plan:  Acute comminuted fracture involving the right wrist X-ray of the right wrist showed Comminuted, displaced fractures are seen in distal right radius and ulna. I personally reviewed the patient's x-ray of the wrist showing comminuted displaced fracture noted in the distal radius and ulna PT OT consulted Continue current pain regiment Keeping cefazolin every 8 hours We appreciate input of orthopedics team  Hypercholesterolemia Continue statin therapy   Hypothyroidism, unspecified type Continue levothyroxine   Essential hypertension Continue current home antihypertensives  Aortic atherosclerosis (HCC) Continue statin therapy   Atrial fibrillation, unspecified type ([place]) Continue metoprolol for rate control Holding Eliquis at this time in the setting of acute fracture to prevent bleeding   Bronchiectasis without complication (HCC) Continue as needed nebulization   Gastroesophageal reflux disease without esophagitis Continue omeprazole  History of breast cancer Outpatient follow-up and surveillance     History of thyroid cancer On thyroid replacement.  Follow tsh.      Obstructive sleep apnea Continue cpap. Discussed using regularly.       Stress Continue Cymbalta     Tachy-brady syndrome (HCC) Continue metoprolol        Advance Care Planning: Discussed with patient as well as patient's daughter present at bedside.  Input of PT OT needed to enhance  placement  Consults: Orthopedics  Family Communication: Discussed with patient's daughter at bedside  Severity of Illness: We will admit patient as an observation hoping that patient will not spend more than 2 midnights in the hospital  Author: Verline Lema, MD 11/28/2022 6:23 PM  For on call review  [email or web address]

## 2022-11-29 DIAGNOSIS — Z85820 Personal history of malignant melanoma of skin: Secondary | ICD-10-CM | POA: Diagnosis not present

## 2022-11-29 DIAGNOSIS — G4733 Obstructive sleep apnea (adult) (pediatric): Secondary | ICD-10-CM | POA: Diagnosis present

## 2022-11-29 DIAGNOSIS — I495 Sick sinus syndrome: Secondary | ICD-10-CM | POA: Diagnosis present

## 2022-11-29 DIAGNOSIS — E7800 Pure hypercholesterolemia, unspecified: Secondary | ICD-10-CM | POA: Diagnosis present

## 2022-11-29 DIAGNOSIS — S62101A Fracture of unspecified carpal bone, right wrist, initial encounter for closed fracture: Secondary | ICD-10-CM | POA: Diagnosis present

## 2022-11-29 DIAGNOSIS — E039 Hypothyroidism, unspecified: Secondary | ICD-10-CM | POA: Diagnosis present

## 2022-11-29 DIAGNOSIS — F419 Anxiety disorder, unspecified: Secondary | ICD-10-CM | POA: Diagnosis present

## 2022-11-29 DIAGNOSIS — I700 Atherosclerosis of aorta: Secondary | ICD-10-CM | POA: Diagnosis present

## 2022-11-29 DIAGNOSIS — Z85850 Personal history of malignant neoplasm of thyroid: Secondary | ICD-10-CM | POA: Diagnosis not present

## 2022-11-29 DIAGNOSIS — Z7901 Long term (current) use of anticoagulants: Secondary | ICD-10-CM | POA: Diagnosis not present

## 2022-11-29 DIAGNOSIS — R0601 Orthopnea: Secondary | ICD-10-CM | POA: Diagnosis present

## 2022-11-29 DIAGNOSIS — W010XXA Fall on same level from slipping, tripping and stumbling without subsequent striking against object, initial encounter: Secondary | ICD-10-CM | POA: Diagnosis present

## 2022-11-29 DIAGNOSIS — I10 Essential (primary) hypertension: Secondary | ICD-10-CM | POA: Diagnosis present

## 2022-11-29 DIAGNOSIS — Z853 Personal history of malignant neoplasm of breast: Secondary | ICD-10-CM | POA: Diagnosis not present

## 2022-11-29 DIAGNOSIS — Y929 Unspecified place or not applicable: Secondary | ICD-10-CM | POA: Diagnosis not present

## 2022-11-29 DIAGNOSIS — Z803 Family history of malignant neoplasm of breast: Secondary | ICD-10-CM | POA: Diagnosis not present

## 2022-11-29 DIAGNOSIS — Z8249 Family history of ischemic heart disease and other diseases of the circulatory system: Secondary | ICD-10-CM | POA: Diagnosis not present

## 2022-11-29 DIAGNOSIS — K219 Gastro-esophageal reflux disease without esophagitis: Secondary | ICD-10-CM | POA: Diagnosis present

## 2022-11-29 DIAGNOSIS — I4819 Other persistent atrial fibrillation: Secondary | ICD-10-CM | POA: Diagnosis present

## 2022-11-29 DIAGNOSIS — S52601A Unspecified fracture of lower end of right ulna, initial encounter for closed fracture: Secondary | ICD-10-CM | POA: Diagnosis present

## 2022-11-29 DIAGNOSIS — S52501A Unspecified fracture of the lower end of right radius, initial encounter for closed fracture: Secondary | ICD-10-CM | POA: Diagnosis present

## 2022-11-29 DIAGNOSIS — Z801 Family history of malignant neoplasm of trachea, bronchus and lung: Secondary | ICD-10-CM | POA: Diagnosis not present

## 2022-11-29 DIAGNOSIS — Z880 Allergy status to penicillin: Secondary | ICD-10-CM | POA: Diagnosis not present

## 2022-11-29 DIAGNOSIS — S62101D Fracture of unspecified carpal bone, right wrist, subsequent encounter for fracture with routine healing: Secondary | ICD-10-CM | POA: Diagnosis not present

## 2022-11-29 DIAGNOSIS — J479 Bronchiectasis, uncomplicated: Secondary | ICD-10-CM | POA: Diagnosis present

## 2022-11-29 DIAGNOSIS — Z950 Presence of cardiac pacemaker: Secondary | ICD-10-CM | POA: Diagnosis not present

## 2022-11-29 DIAGNOSIS — F32A Depression, unspecified: Secondary | ICD-10-CM | POA: Diagnosis present

## 2022-11-29 DIAGNOSIS — R0989 Other specified symptoms and signs involving the circulatory and respiratory systems: Secondary | ICD-10-CM | POA: Diagnosis present

## 2022-11-29 LAB — CBC WITH DIFFERENTIAL/PLATELET
Abs Immature Granulocytes: 0.04 10*3/uL (ref 0.00–0.07)
Basophils Absolute: 0.1 10*3/uL (ref 0.0–0.1)
Basophils Relative: 1 %
Eosinophils Absolute: 0 10*3/uL (ref 0.0–0.5)
Eosinophils Relative: 0 %
HCT: 38.5 % (ref 36.0–46.0)
Hemoglobin: 12.4 g/dL (ref 12.0–15.0)
Immature Granulocytes: 1 %
Lymphocytes Relative: 17 %
Lymphs Abs: 1.4 10*3/uL (ref 0.7–4.0)
MCH: 30.7 pg (ref 26.0–34.0)
MCHC: 32.2 g/dL (ref 30.0–36.0)
MCV: 95.3 fL (ref 80.0–100.0)
Monocytes Absolute: 1 10*3/uL (ref 0.1–1.0)
Monocytes Relative: 12 %
Neutro Abs: 5.6 10*3/uL (ref 1.7–7.7)
Neutrophils Relative %: 69 %
Platelets: 187 10*3/uL (ref 150–400)
RBC: 4.04 MIL/uL (ref 3.87–5.11)
RDW: 12.9 % (ref 11.5–15.5)
WBC: 8.1 10*3/uL (ref 4.0–10.5)
nRBC: 0 % (ref 0.0–0.2)

## 2022-11-29 MED ORDER — OXYCODONE HCL 5 MG PO TABS: 10.0000 mg | ORAL_TABLET | ORAL | Status: DC | PRN

## 2022-11-29 MED ORDER — OXYCODONE HCL 5 MG PO TABS
5.0000 mg | ORAL_TABLET | ORAL | Status: DC | PRN
  Administered 2022-11-29: 10 mg via ORAL
  Administered 2022-11-30: 5 mg via ORAL
  Administered 2022-11-30: 10 mg via ORAL
  Administered 2022-11-30: 5 mg via ORAL
  Filled 2022-11-29: qty 1
  Filled 2022-11-29: qty 2
  Filled 2022-11-29: qty 1
  Filled 2022-11-29 (×2): qty 2
  Filled 2022-11-29: qty 1

## 2022-11-29 MED ORDER — OXYCODONE HCL 5 MG PO TABS
5.0000 mg | ORAL_TABLET | ORAL | Status: DC | PRN
  Administered 2022-11-29 (×3): 5 mg via ORAL
  Filled 2022-11-29 (×3): qty 1

## 2022-11-29 MED ORDER — DOCUSATE SODIUM 100 MG PO CAPS
200.0000 mg | ORAL_CAPSULE | Freq: Two times a day (BID) | ORAL | Status: DC
  Administered 2022-11-29 – 2022-11-30 (×2): 200 mg via ORAL
  Filled 2022-11-29 (×2): qty 2

## 2022-11-29 MED ORDER — IPRATROPIUM-ALBUTEROL 0.5-2.5 (3) MG/3ML IN SOLN: 3.0000 mL | Freq: Four times a day (QID) | RESPIRATORY_TRACT | Status: DC | PRN

## 2022-11-29 NOTE — Progress Notes (Addendum)
Subjective:  Patient is complaining of right wrist pain.  She still has paresthesias in her right thumb.    Objective:   VITALS:   Vitals:   11/28/22 2346 11/29/22 0513 11/29/22 0808 11/29/22 1556  BP: 116/67 127/72 133/80 128/78  Pulse: 83 80 80 83  Resp:   17 18  Temp: 98.1 F (36.7 C) 98.2 F (36.8 C) 98.1 F (36.7 C) 98.1 F (36.7 C)  TempSrc:      SpO2: 97% 96% 95% 96%  Weight:      Height:        PHYSICAL EXAM: Right upper extremity: I personally loosened the patient's sugar-tong splint and underlying [place] this evening to help with the patient's pain.  All of her digits are well-perfused.  She has intact sensation light touch in the index through small finger but has diminished sensation light touch in the tip of the right thumb which is consistent with her exam from the ER.  Patient's compartments are soft and compressible.  She can flex and extend all 5 digits.   LABS  Results for orders placed or performed during the hospital encounter of 11/28/22 (from the past 24 hour(s))  CBC with Differential/Platelet     Status: None   Collection Time: 11/29/22  8:40 AM  Result Value Ref Range   WBC 8.1 4.0 - 10.5 K/uL   RBC 4.04 3.87 - 5.11 MIL/uL   Hemoglobin 12.4 12.0 - 15.0 g/dL   HCT 38.5 36.0 - 46.0 %   MCV 95.3 80.0 - 100.0 fL   MCH 30.7 26.0 - 34.0 pg   MCHC 32.2 30.0 - 36.0 g/dL   RDW 12.9 11.5 - 15.5 %   Platelets 187 150 - 400 K/uL   nRBC 0.0 0.0 - 0.2 %   Neutrophils Relative % 69 %   Neutro Abs 5.6 1.7 - 7.7 K/uL   Lymphocytes Relative 17 %   Lymphs Abs 1.4 0.7 - 4.0 K/uL   Monocytes Relative 12 %   Monocytes Absolute 1.0 0.1 - 1.0 K/uL   Eosinophils Relative 0 %   Eosinophils Absolute 0.0 0.0 - 0.5 K/uL   Basophils Relative 1 %   Basophils Absolute 0.1 0.0 - 0.1 K/uL   Immature Granulocytes 1 %   Abs Immature Granulocytes 0.04 0.00 - 0.07 K/uL   *Note: Due to a large number of results and/or encounters for the requested time period, some results  have not been displayed. A complete set of results can be found in Results Review.    CT WRIST RIGHT WO CONTRAST  Result Date: 11/28/2022 CLINICAL DATA:  Fall EXAM: CT OF THE RIGHT WRIST WITHOUT CONTRAST TECHNIQUE: Multidetector CT imaging of the right wrist was performed according to the standard protocol. Multiplanar CT image reconstructions were also generated. RADIATION DOSE REDUCTION: This exam was performed according to the departmental dose-optimization program which includes automated exposure control, adjustment of the mA and/or kV according to patient size and/or use of iterative reconstruction technique. COMPARISON:  Right wrist x-ray 11/28/2022 FINDINGS: Bones/Joint/Cartilage There is an acute distal radius fracture, transverse. There is 1/2 shaft with posterior displacement of the distal fracture fragment with mild apex anterior angulation and 1 cm of overlap. There is an acute oblique fracture of the distal ulna which is in near anatomic alignment with mild apex anterior angulation. There is a nondisplaced ulnar styloid fracture. There is no evidence for dislocation. Ligaments Suboptimally assessed by CT. Muscles and Tendons No focal fluid collection or foreign body  identified. Soft tissues There is soft tissue swelling and edema surrounding the fractures. IMPRESSION: 1. Acute displaced and angulated distal radius fracture. 2. Acute oblique fracture of the distal ulna with mild angulation. 3. Nondisplaced ulnar styloid fracture. Electronically Signed   By: Ronney Asters M.D.   On: 11/28/2022 23:04   DG Wrist 2 Views Right  Result Date: 11/28/2022 CLINICAL DATA:  Distal radial and ulnar fracture reduction EXAM: RIGHT WRIST - 2 VIEW COMPARISON:  11/28/2022 FINDINGS: Fiberglass splint obscures bony detail. Displaced Colles fracture is again noted with currently about 1.3 cm posterior displacement of the distal radial fragment with respect to the proximal, improved from previous 16 mm and previous  overlap. Reduced apex volar angulation. Improved alignment at the distal ulnar metaphysis oblique fracture site. IMPRESSION: 1. Mild improved alignment at the distal radial fracture although there still 1.3 cm posterior displacement of the distal fragment with respect to the proximal. Improved alignment at the distal ulnar fracture site. Electronically Signed   By: Van Clines M.D.   On: 11/28/2022 18:09   CT Head Wo Contrast  Result Date: 11/28/2022 CLINICAL DATA:  Head trauma, minor (Age >= 65y); Neck trauma (Age >= 65y) EXAM: CT HEAD WITHOUT CONTRAST CT CERVICAL SPINE WITHOUT CONTRAST TECHNIQUE: Multidetector CT imaging of the head and cervical spine was performed following the standard protocol without intravenous contrast. Multiplanar CT image reconstructions of the cervical spine were also generated. RADIATION DOSE REDUCTION: This exam was performed according to the departmental dose-optimization program which includes automated exposure control, adjustment of the mA and/or kV according to patient size and/or use of iterative reconstruction technique. COMPARISON:  January 08, 2020. FINDINGS: CT HEAD FINDINGS Brain: No evidence of acute infarction, hemorrhage, hydrocephalus, extra-axial collection or mass lesion/mass effect. Vascular: No hyperdense vessel identified. Skull: No fracture. Sinuses/Orbits: Clear sinuses.  No acute orbital findings. Other: No mastoid effusions. CT CERVICAL SPINE FINDINGS Alignment: Similar alignment in comparison to the prior. No new sagittal subluxation. Skull base and vertebrae: Vertebral body heights maintained. No evidence of acute fracture. Soft tissues and spinal canal: No prevertebral fluid or swelling. No visible canal hematoma. Disc levels: Similar severe multilevel degenerative change including facet uncovertebral hypertrophy with varying degrees of neural foraminal stenosis. Upper chest: Visualized lung apices are clear. IMPRESSION: No acute intracranial or  cervical spine findings. Electronically Signed   By: Margaretha Sheffield M.D.   On: 11/28/2022 16:08   CT Cervical Spine Wo Contrast  Result Date: 11/28/2022 CLINICAL DATA:  Head trauma, minor (Age >= 65y); Neck trauma (Age >= 65y) EXAM: CT HEAD WITHOUT CONTRAST CT CERVICAL SPINE WITHOUT CONTRAST TECHNIQUE: Multidetector CT imaging of the head and cervical spine was performed following the standard protocol without intravenous contrast. Multiplanar CT image reconstructions of the cervical spine were also generated. RADIATION DOSE REDUCTION: This exam was performed according to the departmental dose-optimization program which includes automated exposure control, adjustment of the mA and/or kV according to patient size and/or use of iterative reconstruction technique. COMPARISON:  January 08, 2020. FINDINGS: CT HEAD FINDINGS Brain: No evidence of acute infarction, hemorrhage, hydrocephalus, extra-axial collection or mass lesion/mass effect. Vascular: No hyperdense vessel identified. Skull: No fracture. Sinuses/Orbits: Clear sinuses.  No acute orbital findings. Other: No mastoid effusions. CT CERVICAL SPINE FINDINGS Alignment: Similar alignment in comparison to the prior. No new sagittal subluxation. Skull base and vertebrae: Vertebral body heights maintained. No evidence of acute fracture. Soft tissues and spinal canal: No prevertebral fluid or swelling. No visible canal hematoma. Disc levels:  Similar severe multilevel degenerative change including facet uncovertebral hypertrophy with varying degrees of neural foraminal stenosis. Upper chest: Visualized lung apices are clear. IMPRESSION: No acute intracranial or cervical spine findings. Electronically Signed   By: Margaretha Sheffield M.D.   On: 11/28/2022 16:08   DG Elbow 2 Views Right  Result Date: 11/28/2022 CLINICAL DATA:  Fall, pain EXAM: RIGHT ELBOW - 2 VIEW COMPARISON:  None Available. FINDINGS: Suboptimal positioning on the lateral view, which limits  assessment. Within this limitation, there is no evidence of fracture or dislocation. No large elbow joint effusion. No significant arthropathy. No focal soft tissue abnormality. IMPRESSION: Slightly limited exam without evidence of fracture or dislocation. Electronically Signed   By: Davina Poke D.O.   On: 11/28/2022 16:03   DG Wrist Complete Right  Result Date: 11/28/2022 CLINICAL DATA:  Trauma, fall EXAM: RIGHT WRIST - COMPLETE 3+ VIEW COMPARISON:  05/19/2011 FINDINGS: There is a comminuted fracture in the distal metaphysis and distal end of ulna. Fracture line is involving the base of the ulnar styloid. There is angulation at the fracture site. There is comminuted displaced fracture in the distal radius. There is dorsal displacement of distal fracture fragment along with the carpals in relation to the distal shaft of radius. Degenerative changes are noted in multiple joints, more so in the interphalangeal joint of the right thumb. IMPRESSION: Comminuted, displaced fractures are seen in distal right radius and ulna. Electronically Signed   By: Elmer Picker M.D.   On: 11/28/2022 14:57    Assessment/Plan:     Principal Problem:   Right wrist fracture  I reviewed the patient's CT scan.  She still has dorsal angulation of the distal radius on the sagittal views.  The coronal alignment is acceptable.   Patient will continue splinting and antibiotics.  Patient will not have surgery during this hospitalization.  She will follow-up in the office next week.  Once the swelling has improved the patient could undergo ORIF of her right distal both bone forearm fracture.  Patient will continue elevation right upper extremity.  She must use a platform walker and may not weight-bear through her right wrist.  Patient should remain on PO antibiotics until follow up given her skin tear.      Thornton Park , MD 11/29/2022, 5:49 PM

## 2022-11-29 NOTE — Evaluation (Signed)
Physical Therapy Evaluation Patient Details Name: Kerri Mills MRN: [id number] DOB: [birth year] Today's Date: 11/29/2022  History of Present Illness  Pt admitted secondary to fall with R wrist fx s/p closed reduction. History includes anxiety, depression, GERD, and HTN.  Clinical Impression  Pt is a pleasant 89 year old female who was admitted for R wrist fx s/p closed reduction. PT/OT co-evaluation performed this date. Pt performs bed mobility with min assist, transfers with min assist, and ambulation with cga and Platform RW. Pt demonstrates deficits with strength/pain/mobility. Would recommend close supervision of family at home especially with mobility and 2 handed activities. Would benefit from skilled PT to address above deficits and promote optimal return to PLOF. Recommend transition to [place] upon discharge from acute hospitalization.      Recommendations for follow up therapy are one component of a multi-disciplinary discharge planning process, led by the attending physician.  Recommendations may be updated based on patient status, additional functional criteria and insurance authorization.  Follow Up Recommendations Home health PT      Assistance Recommended at Discharge Intermittent Supervision/Assistance  Patient can return home with the following  A little help with walking and/or transfers;A little help with bathing/dressing/bathroom;Help with stairs or ramp for entrance    Equipment Recommendations BSC/3in1 (R UE Platform RW)  Recommendations for Other Services       Functional Status Assessment Patient has had a recent decline in their functional status and demonstrates the ability to make significant improvements in function in a reasonable and predictable amount of time.     Precautions / Restrictions Precautions Precautions: Fall Restrictions Weight Bearing Restrictions: Yes RUE Weight Bearing: Weight bear through elbow only      Mobility  Bed Mobility Overal  bed mobility: Needs Assistance Bed Mobility: Supine to Sit     Supine to sit: Min assist     General bed mobility comments: cues for sequencing. +2 for safety, however performed with +1    Transfers Overall transfer level: Needs assistance Equipment used: Right platform walker Transfers: Sit to/from Stand Sit to Stand: Min assist           General transfer comment: needs cues for safety and sequencing. Once standing, able to place R UE on platform    Ambulation/Gait Ambulation/Gait assistance: Min guard Gait Distance (Feet): 40 Feet Assistive device: Right platform walker Gait Pattern/deviations: Step-to pattern       General Gait Details: cues given for sequencing and steering RW. Pt able to progress to close supervision  Stairs            Wheelchair Mobility    Modified Rankin (Stroke Patients Only)       Balance Overall balance assessment: Needs assistance, History of Falls Sitting-balance support: Feet supported, Bilateral upper extremity supported Sitting balance-Leahy Scale: Good     Standing balance support: Bilateral upper extremity supported Standing balance-Leahy Scale: Fair                               Pertinent Vitals/Pain Pain Assessment Pain Assessment: Faces Faces Pain Scale: Hurts even more Pain Location: R UE Pain Descriptors / Indicators: Grimacing, Dull, Discomfort Pain Intervention(s): Limited activity within patient's tolerance, Premedicated before session    Home Living Family/patient expects to be discharged to:: Assisted living Living Arrangements: Alone Available Help at Discharge: Family;Available PRN/intermittently Type of Home: Independent living facility Home Access: Level entry       Home Layout:  One level Home Equipment: Rollator (4 wheels);Shower seat Additional Comments: pt will be staying with daughter at discharge. Pt from [place]. Per patient, she plans to stay on main level with  access to bathroom. 1 step to enter without railing. Daughter plans to be available 24/7    Prior Function Prior Level of Function : Independent/Modified Independent;Driving             Mobility Comments: has been using rollater since Jan 2024 ADLs Comments: independent at baseline     Hand Dominance        Extremity/Trunk Assessment   Upper Extremity Assessment Upper Extremity Assessment:  (R UE 2/5 secondary to pain)    Lower Extremity Assessment Lower Extremity Assessment: Overall WFL for tasks assessed       Communication   Communication: No difficulties  Cognition Arousal/Alertness: Awake/alert Behavior During Therapy: Anxious Overall Cognitive Status: Within Functional Limits for tasks assessed                                 General Comments: anxious with all mobility. Needs cues for sequencing and precautions        General Comments      Exercises Other Exercises Other Exercises: ambulated to [facility], needed min assist for transfers and cga for hygiene. Then went to sink and perform ADLs with OT. PT assisted with ambulation to/from sink   Assessment/Plan    PT Assessment Patient needs continued PT services  PT Problem List Decreased strength;Decreased balance;Decreased mobility;Pain       PT Treatment Interventions DME instruction;Gait training;Stair training;Therapeutic exercise;Balance training    PT Goals (Current goals can be found in the Care Plan section)  Acute Rehab PT Goals Patient Stated Goal: to go home PT Goal Formulation: With patient Time For Goal Achievement: 12/13/22 Potential to Achieve Goals: Good    Frequency 7X/week     Co-evaluation PT/OT/SLP Co-Evaluation/Treatment: Yes Reason for Co-Treatment: For patient/therapist safety;To address functional/ADL transfers PT goals addressed during session: Mobility/safety with mobility;Proper use of DME OT goals addressed during session: ADL's and self-care        AM-PAC PT "6 Clicks" Mobility  Outcome Measure Help needed turning from your back to your side while in a flat bed without using bedrails?: A Little Help needed moving from lying on your back to sitting on the side of a flat bed without using bedrails?: A Little Help needed moving to and from a bed to a chair (including a wheelchair)?: A Little Help needed standing up from a chair using your arms (e.g., wheelchair or bedside chair)?: A Little Help needed to walk in hospital room?: A Little Help needed climbing 3-5 steps with a railing? : A Little 6 Click Score: 18    End of Session   Activity Tolerance: Patient limited by pain Patient left: in chair;with chair alarm set Nurse Communication: Mobility status PT Visit Diagnosis: Muscle weakness (generalized) (M62.81);History of falling (Z91.81);Difficulty in walking, not elsewhere classified (R26.2);Pain Pain - Right/Left: Right Pain - part of body: Arm    Time: [id number] PT Time Calculation (min) (ACUTE ONLY): 43 min   Charges:   PT Evaluation $PT Eval Low Complexity: 1 Low PT Treatments $Gait Training: 8-22 mins        Greggory Stallion, PT, DPT, GCS [phone]   Kamyla Olejnik 11/29/2022, 10:43 AM

## 2022-11-29 NOTE — Progress Notes (Signed)
   11/29/22 1600  Spiritual Encounters  Type of Visit Initial  Care provided to: Patient  Referral source Other (comment) (Rounding)  Reason for visit Routine spiritual support  OnCall Visit No   Chaplain routine rounding stopped to check on patient.  Provided compassionate presence and reflective listening.  Follow up available as needed.

## 2022-11-29 NOTE — Progress Notes (Signed)
Progress Note   Patient: Kerri Mills [id number] DOB: [birth year] DOA: 11/28/2022     0 DOS: the patient was seen and examined on 11/29/2022    Subjective: Patient seen and examined at bedside this morning in the presence of occupational therapist as well as physical therapist. Patient also been evaluated by orthopedic surgeon. Currently being planned for outpatient follow-up and review to decide on possible future surgical intervention once swelling is much better. [name] making plans to possibly have patient come to live with her with home health services in place.  Patient and family requesting for possible discharge tomorrow once all services are ready. Has some paresthesia involving the right first and second fingers Denies nausea vomiting chest pain cough or urinary complaints  Brief hospital course:  Kerri Mills is a 89 y.o. female with medical history significant of tachybradycardia syndrome, persistent atrial fibrillation on Eliquis, bronchiectasis, hypertension, hyperlipidemia who presented to the hospital with complaints of right wrist pain after a fall that happened t on the day of presentation. According to patient she was coming out of the elevator and she got her leg entangled and therefore falling and landing on her right hand. She immediately started feeling pain with intensity 10/10 associated with swelling around the wrist and therefore was brought to the emergency room for further management.  ED course: In the emergency room vitals were stable. X-ray of the right wrist showed Comminuted, displaced fractures are seen in distal right radius and ulna. Patient underwent manipulation and reduction in the emergency room by ED physician. Postreduction imaging showed some improvement. ED physician discussed the case with orthopedics surgeon Dr. Mack Guise. According to him patient would not need acute surgical intervention at this time but rather splinting and IV  cefazolin every 8 hours and then discharge when stable on oral Keflex and Bactrim to follow-up as an outpatient.  Antibiotics was recommended on account of small area of opening in the skin to prevent superimposed infection at the fracture site. Given the concerns that patient will need adequate pain management as well as possible acute rehab placement hospitalist service was contacted to admit the patient for further management.  PT OT on board and recommending home health.   Assessment and Plan:   Acute comminuted and displaced fracture involving the right wrist X-ray of the right wrist showed Comminuted, displaced fractures are seen in distal right radius and ulna. I personally reviewed the patient's x-ray of the wrist showing comminuted displaced fracture noted in the distal radius and ulna PT OT on board Continue current pain regiment Continue cefazolin every 8 hours as recommended by orthopedic surgeon to prevent superimposed bacterial infection At discharge orthopedic surgeon recommends Keflex and Bactrim We appreciate input of orthopedics team   Hypercholesterolemia Continue home dose of pravastatin   Hypothyroidism, unspecified type Continue levothyroxine   Essential hypertension Continue home antihypertensives; spironolactone, metoprolol   Aortic atherosclerosis ([place]) Continue current statin therapy   Atrial fibrillation, unspecified type ([place]) Continue metoprolol for rate control Holding Eliquis at this time in the setting of acute fracture to prevent bleeding   Bronchiectasis without complication ([place]) Continue as needed nebulization   Gastroesophageal reflux disease without esophagitis Continue omeprazole   History of breast cancer Continue outpatient surveillance   History of thyroid cancer On thyroid replacement.       Obstructive sleep apnea Continue cpap at night     Tachy-brady syndrome ([facility]) Continue metoprolol   DVT prophylaxis: SCD for now ,  holding off pharmacologic  anticoagulation at this time given significant bleeding and swelling at the fracture site.  Will reevaluate this later and determine if pharmacologic anticoagulation needs to be resumed    Advance Care Planning: Discussed with patient as well as daughter at bedside  Consults: Orthopedics   Family Communication: Discussed with patient's daughter at bedside   Severity of Illness:  Physical Exam:  General: In acute pain however appears as stated age Extremities: Right upper extremity splinted able to move the fingers Cardiovascular: Heart sounds 1 and 2 heard no murmurs appreciated Respiratory: Normal breath sounds with no wheezes Abdomen: Full nondistended nontender CNS: Alert and oriented x 3    Vitals:   11/28/22 2005 11/28/22 2346 11/29/22 0513 11/29/22 0808  BP: 134/80 116/67 127/72 133/80  Pulse: 84 83 80 80  Resp:    17  Temp: 99.1 F (37.3 C) 98.1 F (36.7 C) 98.2 F (36.8 C) 98.1 F (36.7 C)  TempSrc:      SpO2: 95% 97% 96% 95%  Weight:      Height:        Data Reviewed: Have reviewed patient's CBC and BMP which is within normal limits  Family Communication: Plan of care discussed with patient's daughter present at bedside  Disposition: Status is: Inpatient Given significant pain management as well as swelling at the fracture site the need for physical therapy and Occupational Therapy's input.  We will switch patient from observation to inpatient.   Planned Discharge Destination: Home with Home Health     Time spent: 38 minutes  Author: Verline Lema, MD 11/29/2022 1:55 PM  For on call review [email or web address].

## 2022-11-29 NOTE — Evaluation (Signed)
Occupational Therapy Evaluation Patient Details Name: Kerri Mills MRN: [id number] DOB: June 11, 1931 Today's Date: 11/29/2022   History of Present Illness Pt admitted secondary to fall with R wrist fx s/p closed reduction. History includes anxiety, depression, GERD, and HTN.   Clinical Impression   Kerri Mills was seen for OT evaluation this date. Prior to hospital admission, pt was generally independent with ADL/IADL management. She endorses using a Rolator for community mobility and trying to stay active within her community. Pt lives at [place]. Pt presents to acute OT demonstrating impaired ADL performance and functional mobility 2/2 increased pain and decreased functional use of her R(dominant) UE  (See OT problem list for additional limitations). Pt has orders for her RUE to remain NWB and currently requires MIN A for UB/LB ADL management with increased assist for two handed tasks. Pt would benefit from skilled OT services to address noted impairments and functional limitations (see below for any additional details) in order to maximize safety and independence while minimizing falls risk and caregiver burden. Upon hospital discharge, recommend HHOT to maximize pt safety and return to functional independence during meaningful occupations of daily life.      Recommendations for follow up therapy are one component of a multi-disciplinary discharge planning process, led by the attending physician.  Recommendations may be updated based on patient status, additional functional criteria and insurance authorization.   Follow Up Recommendations  Home health OT     Assistance Recommended at Discharge Intermittent Supervision/Assistance  Patient can return home with the following A little help with walking and/or transfers;A little help with bathing/dressing/bathroom;Assistance with feeding;Help with stairs or ramp for entrance;Assist for transportation;Assistance with cooking/housework     Functional Status Assessment  Patient has had a recent decline in their functional status and demonstrates the ability to make significant improvements in function in a reasonable and predictable amount of time.  Equipment Recommendations  BSC/3in1    Recommendations for Other Services       Precautions / Restrictions Precautions Precautions: Fall Restrictions Weight Bearing Restrictions: Yes RUE Weight Bearing: Weight bear through elbow only      Mobility Bed Mobility Overal bed mobility: Needs Assistance Bed Mobility: Supine to Sit     Supine to sit: Min assist     General bed mobility comments: cues for sequencing. +2 for safety, however performed with +1    Transfers Overall transfer level: Needs assistance Equipment used: Right platform walker Transfers: Sit to/from Stand Sit to Stand: Min assist           General transfer comment: needs cues for safety and sequencing. Once standing, able to place R UE on platform      Balance Overall balance assessment: Needs assistance, History of Falls Sitting-balance support: Feet supported, Bilateral upper extremity supported Sitting balance-Leahy Scale: Good     Standing balance support: Reliant on assistive device for balance, During functional activity, Single extremity supported, Bilateral upper extremity supported Standing balance-Leahy Scale: Fair                             ADL either performed or assessed with clinical judgement   ADL Overall ADL's : Needs assistance/impaired                                       General ADL Comments: MIN A for bed mobility  and initial STS from EOB. Able to progress to SUPERVISION for functional mobility, toilet transfer, and toileting. Performs standing grooming at sink for ~10 min and is able to complete oral care and face washing with LUE given cueing and education on compensatory strategies for 1 handed ADL management.     Vision Patient  Visual Report: No change from baseline       Perception     Praxis      Pertinent Vitals/Pain Pain Assessment Pain Assessment: Faces Faces Pain Scale: Hurts even more Pain Location: R UE Pain Descriptors / Indicators: Grimacing, Dull, Discomfort Pain Intervention(s): Limited activity within patient's tolerance, Monitored during session, Repositioned     Hand Dominance Right   Extremity/Trunk Assessment Upper Extremity Assessment Upper Extremity Assessment: RUE deficits/detail RUE Deficits / Details: able to lift RUE on/off pillow when attempting mobility. Able to hold up for brief periods, but fatigues quickly due to pain and weight of splint. RUE: Unable to fully assess due to pain;Unable to fully assess due to immobilization   Lower Extremity Assessment Lower Extremity Assessment: Overall WFL for tasks assessed       Communication Communication Communication: No difficulties   Cognition Arousal/Alertness: Awake/alert Behavior During Therapy: Anxious Overall Cognitive Status: Within Functional Limits for tasks assessed                                 General Comments: anxious with all mobility. Needs cues for sequencing and precautions     General Comments       Exercises Other Exercises Other Exercises: OT facilitated bed/functional mobility, toileting, and standing grooming with education on safe use of AE/DME as described above. See ADL section for additional detail. Pt educated on falls prevention, compensatory ADL management, and routines modifications to support safety and functional independence t/o session.   Shoulder Instructions      Home Living Family/patient expects to be discharged to:: Assisted living Living Arrangements: Alone Available Help at Discharge: Family;Available PRN/intermittently Type of Home: Independent living facility Home Access: Level entry     Home Layout: One level               Home Equipment: Rollator (4  wheels);Shower seat   Additional Comments: pt will be staying with daughter at discharge. Pt from [place]. Per patient, she plans to stay on main level with access to bathroom. 1 step to enter without railing. Daughter plans to be available 24/7      Prior Functioning/Environment Prior Level of Function : Independent/Modified Independent;Driving             Mobility Comments: has been using rollater since Jan 2024 ADLs Comments: independent at baseline, community ambulator, +driving.        OT Problem List: Decreased strength;Decreased coordination;Pain;Decreased activity tolerance;Decreased safety awareness;Impaired balance (sitting and/or standing);Decreased knowledge of use of DME or AE;Decreased range of motion;Impaired UE functional use      OT Treatment/Interventions: Self-care/ADL training;Therapeutic activities;Therapeutic exercise;DME and/or AE instruction;Patient/family education;Balance training    OT Goals(Current goals can be found in the care plan section) Acute Rehab OT Goals Patient Stated Goal: to feel better OT Goal Formulation: With patient Time For Goal Achievement: 12/13/22 ADL Goals Pt Will Perform Upper Body Dressing: sitting;with supervision;with set-up Pt Will Perform Lower Body Dressing: sit to/from stand;with supervision;with set-up Pt Will Transfer to Toilet: bedside commode;ambulating;with modified independence Pt Will Perform Toileting - Clothing Manipulation and hygiene: with  modified independence;sit to/from stand;with adaptive equipment  OT Frequency: Min 2X/week    Co-evaluation   Reason for Co-Treatment: For patient/therapist safety;To address functional/ADL transfers PT goals addressed during session: Mobility/safety with mobility;Proper use of DME OT goals addressed during session: ADL's and self-care      AM-PAC OT "6 Clicks" Daily Activity     Outcome Measure Help from another person eating meals?: A Little Help from another  person taking care of personal grooming?: A Little Help from another person toileting, which includes using toliet, bedpan, or urinal?: A Little Help from another person bathing (including washing, rinsing, drying)?: A Little Help from another person to put on and taking off regular upper body clothing?: A Little Help from another person to put on and taking off regular lower body clothing?: A Little 6 Click Score: 18   End of Session Equipment Utilized During Treatment: Gait belt;Rolling walker (2 wheels) Nurse Communication: Mobility status  Activity Tolerance: Patient tolerated treatment well Patient left: in chair;with call bell/phone within reach;with chair alarm set  OT Visit Diagnosis: Other abnormalities of gait and mobility (R26.89);Pain Pain - Right/Left: Right Pain - part of body: Arm;Hand                Time: 0909-1000 OT Time Calculation (min): 51 min Charges:  OT General Charges $OT Visit: 1 Visit OT Evaluation $OT Eval Moderate Complexity: 1 Mod OT Treatments $Self Care/Home Management : 23-37 mins  Shara Blazing, M.S., OTR/L 11/29/22, 12:01 PM

## 2022-11-30 ENCOUNTER — Ambulatory Visit: Payer: PPO | Admitting: Internal Medicine

## 2022-11-30 ENCOUNTER — Ambulatory Visit: Payer: PPO | Admitting: Student

## 2022-11-30 DIAGNOSIS — S62101A Fracture of unspecified carpal bone, right wrist, initial encounter for closed fracture: Secondary | ICD-10-CM | POA: Diagnosis not present

## 2022-11-30 LAB — CBC WITH DIFFERENTIAL/PLATELET
Abs Immature Granulocytes: 0.03 10*3/uL (ref 0.00–0.07)
Basophils Absolute: 0.1 10*3/uL (ref 0.0–0.1)
Basophils Relative: 1 %
Eosinophils Absolute: 0.1 10*3/uL (ref 0.0–0.5)
Eosinophils Relative: 1 %
HCT: 43.1 % (ref 36.0–46.0)
Hemoglobin: 13.5 g/dL (ref 12.0–15.0)
Immature Granulocytes: 0 %
Lymphocytes Relative: 24 %
Lymphs Abs: 2.1 10*3/uL (ref 0.7–4.0)
MCH: 30.4 pg (ref 26.0–34.0)
MCHC: 31.3 g/dL (ref 30.0–36.0)
MCV: 97.1 fL (ref 80.0–100.0)
Monocytes Absolute: 1.1 10*3/uL — ABNORMAL HIGH (ref 0.1–1.0)
Monocytes Relative: 13 %
Neutro Abs: 5.4 10*3/uL (ref 1.7–7.7)
Neutrophils Relative %: 61 %
Platelets: 199 10*3/uL (ref 150–400)
RBC: 4.44 MIL/uL (ref 3.87–5.11)
RDW: 12.7 % (ref 11.5–15.5)
WBC: 8.8 10*3/uL (ref 4.0–10.5)
nRBC: 0 % (ref 0.0–0.2)

## 2022-11-30 LAB — BASIC METABOLIC PANEL
Anion gap: 9 (ref 5–15)
BUN: 13 mg/dL (ref 8–23)
CO2: 28 mmol/L (ref 22–32)
Calcium: 9.6 mg/dL (ref 8.9–10.3)
Chloride: 97 mmol/L — ABNORMAL LOW (ref 98–111)
Creatinine, Ser: 0.97 mg/dL (ref 0.44–1.00)
GFR, Estimated: 56 mL/min — ABNORMAL LOW (ref >60)
Glucose, Bld: 139 mg/dL — ABNORMAL HIGH (ref 70–99)
Potassium: 3.7 mmol/L (ref 3.5–5.1)
Sodium: 134 mmol/L — ABNORMAL LOW (ref 135–145)

## 2022-11-30 MED ORDER — OXYCODONE HCL 5 MG PO TABS: 5.0000 mg | ORAL_TABLET | Freq: Four times a day (QID) | ORAL | 0 refills | Status: DC | PRN

## 2022-11-30 MED ORDER — POTASSIUM CHLORIDE CRYS ER 10 MEQ PO TBCR: 10.0000 meq | EXTENDED_RELEASE_TABLET | Freq: Every day | ORAL | 0 refills | Status: DC

## 2022-11-30 MED ORDER — SULFAMETHOXAZOLE-TRIMETHOPRIM 800-160 MG PO TABS: 1.0000 | ORAL_TABLET | Freq: Two times a day (BID) | ORAL | 0 refills | Status: AC

## 2022-11-30 MED ORDER — CEPHALEXIN 500 MG PO CAPS: 500.0000 mg | ORAL_CAPSULE | Freq: Two times a day (BID) | ORAL | 0 refills | Status: DC

## 2022-11-30 MED ORDER — FUROSEMIDE 20 MG PO TABS: 10.0000 mg | ORAL_TABLET | Freq: Every day | ORAL | 0 refills | Status: DC

## 2022-11-30 NOTE — Progress Notes (Signed)
Physical Therapy Treatment Patient Details Name: Kerri Mills MRN: [id number] DOB: 1931-06-04 Today's Date: 11/30/2022   History of Present Illness Pt admitted secondary to fall with R wrist fx s/p closed reduction. History includes anxiety, depression, GERD, and HTN.    PT Comments    Pt very anxious upon arrival saying she is lying in a soaked bed and no staff have helped her. Upon further questioning, pt reports she didn't ask for help or ring the call bell. Assisted with pt in getting up to [facility] and pt able to perform hygiene. Encouraged to call for help. Able to progress ambulation distance with platform RW. Does become SOB with exertion. O2 sats at 93% on RA and HR at 95bpm. Continue to recommend close supervision with OOB mobility.   Recommendations for follow up therapy are one component of a multi-disciplinary discharge planning process, led by the attending physician.  Recommendations may be updated based on patient status, additional functional criteria and insurance authorization.  Follow Up Recommendations  Home health PT     Assistance Recommended at Discharge Intermittent Supervision/Assistance  Patient can return home with the following A little help with walking and/or transfers;A little help with bathing/dressing/bathroom;Help with stairs or ramp for entrance   Equipment Recommendations  BSC/3in1    Recommendations for Other Services       Precautions / Restrictions Precautions Precautions: Fall Restrictions Weight Bearing Restrictions: Yes RUE Weight Bearing: Weight bear through elbow only Other Position/Activity Restrictions: use of platform RW     Mobility  Bed Mobility Overal bed mobility: Needs Assistance Bed Mobility: Supine to Sit     Supine to sit: Min assist     General bed mobility comments: safe technique and follows commands.    Transfers Overall transfer level: Needs assistance Equipment used: 1 person hand held assist Transfers: Sit  to/from Stand Sit to Stand: Min assist           General transfer comment: due to bathroom urgency, pt deferred use of RW and stood with HHA. Further attempts with RW with only CGA given    Ambulation/Gait Ambulation/Gait assistance: Supervision Gait Distance (Feet): 100 Feet Assistive device: Right platform walker Gait Pattern/deviations: Step-through pattern       General Gait Details: cues given for staying close to RW. Does better with sequencing compared to previous date. Does appear confused when heading back to room stating "I don't know what to do".   Stairs             Wheelchair Mobility    Modified Rankin (Stroke Patients Only)       Balance Overall balance assessment: Needs assistance, History of Falls Sitting-balance support: Feet supported, Bilateral upper extremity supported Sitting balance-Leahy Scale: Good     Standing balance support: Reliant on assistive device for balance, During functional activity, Single extremity supported, Bilateral upper extremity supported Standing balance-Leahy Scale: Fair                              Cognition Arousal/Alertness: Awake/alert Behavior During Therapy: Anxious Overall Cognitive Status: Impaired/Different from baseline                                 General Comments: pt accusing staff of saying she is confused. Pt seems very anxious and is unable to give teach back for mobility technique. Pt reports nobody has got her  OOB in 24 hours        Exercises Other Exercises Other Exercises: ambulated to [facility], needed min assist for transfers and cga for hygiene. Then went to sink and perform ADLs with OT. PT assisted with ambulation to/from sink    General Comments        Pertinent Vitals/Pain Pain Assessment Pain Assessment: Faces Faces Pain Scale: Hurts a little bit Pain Location: R UE Pain Descriptors / Indicators: Grimacing, Dull, Discomfort Pain Intervention(s):  Limited activity within patient's tolerance, Premedicated before session    Home Living                          Prior Function            PT Goals (current goals can now be found in the care plan section) Acute Rehab PT Goals Patient Stated Goal: to go home PT Goal Formulation: With patient Time For Goal Achievement: 12/13/22 Potential to Achieve Goals: Good Progress towards PT goals: Progressing toward goals    Frequency    7X/week      PT Plan Current plan remains appropriate    Co-evaluation              AM-PAC PT "6 Clicks" Mobility   Outcome Measure  Help needed turning from your back to your side while in a flat bed without using bedrails?: A Little Help needed moving from lying on your back to sitting on the side of a flat bed without using bedrails?: A Little Help needed moving to and from a bed to a chair (including a wheelchair)?: A Little Help needed standing up from a chair using your arms (e.g., wheelchair or bedside chair)?: A Little Help needed to walk in hospital room?: A Little Help needed climbing 3-5 steps with a railing? : A Little 6 Click Score: 18    End of Session   Activity Tolerance: Patient limited by pain Patient left: in chair;with chair alarm set Nurse Communication: Mobility status PT Visit Diagnosis: Muscle weakness (generalized) (M62.81);History of falling (Z91.81);Difficulty in walking, not elsewhere classified (R26.2);Pain Pain - Right/Left: Right Pain - part of body: Arm     Time: [id number] PT Time Calculation (min) (ACUTE ONLY): 27 min  Charges:  $Gait Training: 8-22 mins $Therapeutic Activity: 8-22 mins                     Greggory Stallion, PT, DPT, GCS [phone]    Kerri Mills 11/30/2022, 10:44 AM

## 2022-11-30 NOTE — Progress Notes (Signed)
   11/30/22 1500  Spiritual Encounters  Type of Visit Follow up  Care provided to: Pt and family  Referral source  (Rounding)  Reason for visit Routine spiritual support  OnCall Visit No   Chaplain on routine rounding and visited patient as a follow up visit.  Daughter at bedside. Provided compassionate presence, conversation and reflective listening.

## 2022-11-30 NOTE — Plan of Care (Signed)

## 2022-11-30 NOTE — Progress Notes (Signed)
Discharge instructions given to the patient and patient's daughter Donnita Falls.  Reiterated PT instructions on transfers and mobility with use of DMEs.  Both verbalized understanding to instructions.  DMEs for delivery to hospital room by [employer], pending discharge.

## 2022-11-30 NOTE — Progress Notes (Signed)
Subjective:  Patient is seen in her hospital room with her daughter at the bedside today.  Patient states that her right arm pain improved after I rewrapped her sugar-tong splint more loosely last night.    Objective:   VITALS:   Vitals:   11/29/22 2201 11/29/22 2317 11/30/22 0300 11/30/22 0820  BP: (!) 145/87 130/73 130/80 113/83  Pulse: 80 80 76 80  Resp:   20 15  Temp:   98.1 F (36.7 C) 98.4 F (36.9 C)  TempSrc:      SpO2:   94% (!) 88%  Weight:      Height:        PHYSICAL EXAM: Right upper extremity: Patient's sugar-tong splint is fitting properly.  The remains clean and dry.  Patient has improved sensation to light touch in the right thumb and continued sensation in the right index middle ring and small fingers.  Patient can flex and extend all 5 digits.  Her forearm compartments are soft and compressible.  He has intact sensation light touch   LABS  Results for orders placed or performed during the hospital encounter of 11/28/22 (from the past 24 hour(s))  CBC with Differential/Platelet     Status: Abnormal   Collection Time: 11/30/22  5:02 AM  Result Value Ref Range   WBC 8.8 4.0 - 10.5 K/uL   RBC 4.44 3.87 - 5.11 MIL/uL   Hemoglobin 13.5 12.0 - 15.0 g/dL   HCT 43.1 36.0 - 46.0 %   MCV 97.1 80.0 - 100.0 fL   MCH 30.4 26.0 - 34.0 pg   MCHC 31.3 30.0 - 36.0 g/dL   RDW 12.7 11.5 - 15.5 %   Platelets 199 150 - 400 K/uL   nRBC 0.0 0.0 - 0.2 %   Neutrophils Relative % 61 %   Neutro Abs 5.4 1.7 - 7.7 K/uL   Lymphocytes Relative 24 %   Lymphs Abs 2.1 0.7 - 4.0 K/uL   Monocytes Relative 13 %   Monocytes Absolute 1.1 (H) 0.1 - 1.0 K/uL   Eosinophils Relative 1 %   Eosinophils Absolute 0.1 0.0 - 0.5 K/uL   Basophils Relative 1 %   Basophils Absolute 0.1 0.0 - 0.1 K/uL   Immature Granulocytes 0 %   Abs Immature Granulocytes 0.03 0.00 - 0.07 K/uL  Basic metabolic panel     Status: Abnormal   Collection Time: 11/30/22  5:02 AM  Result Value Ref Range   Sodium  134 (L) 135 - 145 mmol/L   Potassium 3.7 3.5 - 5.1 mmol/L   Chloride 97 (L) 98 - 111 mmol/L   CO2 28 22 - 32 mmol/L   Glucose, Bld 139 (H) 70 - 99 mg/dL   BUN 13 8 - 23 mg/dL   Creatinine, Ser 0.97 0.44 - 1.00 mg/dL   Calcium 9.6 8.9 - 10.3 mg/dL   GFR, Estimated 56 (L) >60 mL/min   Anion gap 9 5 - 15   *Note: Due to a large number of results and/or encounters for the requested time period, some results have not been displayed. A complete set of results can be found in Results Review.    CT WRIST RIGHT WO CONTRAST  Result Date: 11/28/2022 CLINICAL DATA:  Fall EXAM: CT OF THE RIGHT WRIST WITHOUT CONTRAST TECHNIQUE: Multidetector CT imaging of the right wrist was performed according to the standard protocol. Multiplanar CT image reconstructions were also generated. RADIATION DOSE REDUCTION: This exam was performed according to the departmental dose-optimization program which includes  automated exposure control, adjustment of the mA and/or kV according to patient size and/or use of iterative reconstruction technique. COMPARISON:  Right wrist x-ray 11/28/2022 FINDINGS: Bones/Joint/Cartilage There is an acute distal radius fracture, transverse. There is 1/2 shaft with posterior displacement of the distal fracture fragment with mild apex anterior angulation and 1 cm of overlap. There is an acute oblique fracture of the distal ulna which is in near anatomic alignment with mild apex anterior angulation. There is a nondisplaced ulnar styloid fracture. There is no evidence for dislocation. Ligaments Suboptimally assessed by CT. Muscles and Tendons No focal fluid collection or foreign body identified. Soft tissues There is soft tissue swelling and edema surrounding the fractures. IMPRESSION: 1. Acute displaced and angulated distal radius fracture. 2. Acute oblique fracture of the distal ulna with mild angulation. 3. Nondisplaced ulnar styloid fracture. Electronically Signed   By: Ronney Asters M.D.   On:  11/28/2022 23:04   DG Wrist 2 Views Right  Result Date: 11/28/2022 CLINICAL DATA:  Distal radial and ulnar fracture reduction EXAM: RIGHT WRIST - 2 VIEW COMPARISON:  11/28/2022 FINDINGS: Fiberglass splint obscures bony detail. Displaced Colles fracture is again noted with currently about 1.3 cm posterior displacement of the distal radial fragment with respect to the proximal, improved from previous 16 mm and previous overlap. Reduced apex volar angulation. Improved alignment at the distal ulnar metaphysis oblique fracture site. IMPRESSION: 1. Mild improved alignment at the distal radial fracture although there still 1.3 cm posterior displacement of the distal fragment with respect to the proximal. Improved alignment at the distal ulnar fracture site. Electronically Signed   By: Van Clines M.D.   On: 11/28/2022 18:09   CT Head Wo Contrast  Result Date: 11/28/2022 CLINICAL DATA:  Head trauma, minor (Age >= 65y); Neck trauma (Age >= 65y) EXAM: CT HEAD WITHOUT CONTRAST CT CERVICAL SPINE WITHOUT CONTRAST TECHNIQUE: Multidetector CT imaging of the head and cervical spine was performed following the standard protocol without intravenous contrast. Multiplanar CT image reconstructions of the cervical spine were also generated. RADIATION DOSE REDUCTION: This exam was performed according to the departmental dose-optimization program which includes automated exposure control, adjustment of the mA and/or kV according to patient size and/or use of iterative reconstruction technique. COMPARISON:  January 08, 2020. FINDINGS: CT HEAD FINDINGS Brain: No evidence of acute infarction, hemorrhage, hydrocephalus, extra-axial collection or mass lesion/mass effect. Vascular: No hyperdense vessel identified. Skull: No fracture. Sinuses/Orbits: Clear sinuses.  No acute orbital findings. Other: No mastoid effusions. CT CERVICAL SPINE FINDINGS Alignment: Similar alignment in comparison to the prior. No new sagittal subluxation.  Skull base and vertebrae: Vertebral body heights maintained. No evidence of acute fracture. Soft tissues and spinal canal: No prevertebral fluid or swelling. No visible canal hematoma. Disc levels: Similar severe multilevel degenerative change including facet uncovertebral hypertrophy with varying degrees of neural foraminal stenosis. Upper chest: Visualized lung apices are clear. IMPRESSION: No acute intracranial or cervical spine findings. Electronically Signed   By: Margaretha Sheffield M.D.   On: 11/28/2022 16:08   CT Cervical Spine Wo Contrast  Result Date: 11/28/2022 CLINICAL DATA:  Head trauma, minor (Age >= 65y); Neck trauma (Age >= 65y) EXAM: CT HEAD WITHOUT CONTRAST CT CERVICAL SPINE WITHOUT CONTRAST TECHNIQUE: Multidetector CT imaging of the head and cervical spine was performed following the standard protocol without intravenous contrast. Multiplanar CT image reconstructions of the cervical spine were also generated. RADIATION DOSE REDUCTION: This exam was performed according to the departmental dose-optimization program which includes automated  exposure control, adjustment of the mA and/or kV according to patient size and/or use of iterative reconstruction technique. COMPARISON:  January 08, 2020. FINDINGS: CT HEAD FINDINGS Brain: No evidence of acute infarction, hemorrhage, hydrocephalus, extra-axial collection or mass lesion/mass effect. Vascular: No hyperdense vessel identified. Skull: No fracture. Sinuses/Orbits: Clear sinuses.  No acute orbital findings. Other: No mastoid effusions. CT CERVICAL SPINE FINDINGS Alignment: Similar alignment in comparison to the prior. No new sagittal subluxation. Skull base and vertebrae: Vertebral body heights maintained. No evidence of acute fracture. Soft tissues and spinal canal: No prevertebral fluid or swelling. No visible canal hematoma. Disc levels: Similar severe multilevel degenerative change including facet uncovertebral hypertrophy with varying degrees of  neural foraminal stenosis. Upper chest: Visualized lung apices are clear. IMPRESSION: No acute intracranial or cervical spine findings. Electronically Signed   By: Margaretha Sheffield M.D.   On: 11/28/2022 16:08   DG Elbow 2 Views Right  Result Date: 11/28/2022 CLINICAL DATA:  Fall, pain EXAM: RIGHT ELBOW - 2 VIEW COMPARISON:  None Available. FINDINGS: Suboptimal positioning on the lateral view, which limits assessment. Within this limitation, there is no evidence of fracture or dislocation. No large elbow joint effusion. No significant arthropathy. No focal soft tissue abnormality. IMPRESSION: Slightly limited exam without evidence of fracture or dislocation. Electronically Signed   By: Davina Poke D.O.   On: 11/28/2022 16:03   DG Wrist Complete Right  Result Date: 11/28/2022 CLINICAL DATA:  Trauma, fall EXAM: RIGHT WRIST - COMPLETE 3+ VIEW COMPARISON:  05/19/2011 FINDINGS: There is a comminuted fracture in the distal metaphysis and distal end of ulna. Fracture line is involving the base of the ulnar styloid. There is angulation at the fracture site. There is comminuted displaced fracture in the distal radius. There is dorsal displacement of distal fracture fragment along with the carpals in relation to the distal shaft of radius. Degenerative changes are noted in multiple joints, more so in the interphalangeal joint of the right thumb. IMPRESSION: Comminuted, displaced fractures are seen in distal right radius and ulna. Electronically Signed   By: Elmer Picker M.D.   On: 11/28/2022 14:57    Assessment/Plan:     Principal Problem:   Right wrist fracture  Patient's pain is currently controlled.  She may be discharged home from an orthopedic standpoint.  Patient is due to me in the office on Monday at 10:15 AM.  The daughter was given the appointment time.  Reviewed the CT scan finding with the patient and her daughter.  We discussed about options including operative versus nonoperative  treatment.  The patient will consider her options and we will discuss this further in the office on Monday.  Patient should continue to elevate her right lower extremity and remain in the splint until her follow-up in the office.  Patient will use a platform walker while in her splint.  Patient should avoid any weightbearing through the right wrist.  Patient and her daughter understood and agreed with this plan.  I answered all their questions today.  Thornton Park , MD 11/30/2022, 2:07 PM

## 2022-11-30 NOTE — Progress Notes (Signed)
Occupational Therapy Treatment Patient Details Name: Kerri Mills MRN: [id number] DOB: March 07, 1931 Today's Date: 11/30/2022   History of present illness Pt admitted secondary to fall with R wrist fx s/p closed reduction. History includes anxiety, depression, GERD, and HTN.   OT comments  Patient received sitting on BSC and agreeable to OT. Tx session focused on improving independence with self-care tasks (specifically UB dressing). Pt with continent void on BSC. She required Min-Mod A for clothing management and Min guard for peri care in standing. Pt was then returned to recliner and engaged in UB dressing via compensatory technique for RUE (first in, last out). She required Min-Mod A overall. VC for sequencing and maintaining precautions t/o. Pt left sitting in recliner with all needs in reach. Pt is making progress toward goal completion. D/C recommendation remains appropriate. OT will continue to follow acutely.    Recommendations for follow up therapy are one component of a multi-disciplinary discharge planning process, led by the attending physician.  Recommendations may be updated based on patient status, additional functional criteria and insurance authorization.    Follow Up Recommendations  Home health OT     Assistance Recommended at Discharge Intermittent Supervision/Assistance  Patient can return home with the following  A little help with walking and/or transfers;A little help with bathing/dressing/bathroom;Assistance with feeding;Help with stairs or ramp for entrance;Assist for transportation;Assistance with cooking/housework   Equipment Recommendations  BSC/3in1    Recommendations for Other Services      Precautions / Restrictions Precautions Precautions: Fall Restrictions Weight Bearing Restrictions: Yes RUE Weight Bearing: Weight bear through elbow only Other Position/Activity Restrictions: use of platform RW       Mobility Bed Mobility                General bed mobility comments: NT, received/left in recliner    Transfers Overall transfer level: Needs assistance Equipment used: 1 person hand held assist, Right platform walker Transfers: Sit to/from Stand, Bed to chair/wheelchair/BSC Sit to Stand: Min guard Stand pivot transfers: Min assist         General transfer comment: stand pivot transfer from BSC>recliner with Min A via HHA 2/2 tight transfer space. STS from recliner with R platform walker.     Balance Overall balance assessment: Needs assistance, History of Falls Sitting-balance support: Feet supported Sitting balance-Leahy Scale: Good     Standing balance support: Bilateral upper extremity supported, Single extremity supported, During functional activity Standing balance-Leahy Scale: Fair                             ADL either performed or assessed with clinical judgement   ADL Overall ADL's : Needs assistance/impaired                 Upper Body Dressing : Moderate assistance;Sitting;Cueing for UE precautions;Cueing for compensatory techniques Upper Body Dressing Details (indicate cue type and reason): instructed pt in "first in, last out" compensatory technique, completed several trials due to pt having difficulty with sequencing     Toilet Transfer: Stand-pivot;BSC/3in1;Minimal assistance Toilet Transfer Details (indicate cue type and reason): received on BSC, Min A via HHA 2/2 limited space between [facility] and recliner Toileting- Clothing Manipulation and Hygiene: Moderate assistance;Sit to/from stand;Minimal assistance;Min guard Toileting - Clothing Manipulation Details (indicate cue type and reason): Min-Mod A to pull up underwear after toileting using LUE, peri care in standing with Min guard for safety  Extremity/Trunk Assessment              Vision Baseline Vision/History: 1 Wears glasses Patient Visual Report: No change from baseline     Perception     Praxis       Cognition Arousal/Alertness: Awake/alert Behavior During Therapy: Anxious Overall Cognitive Status: Within Functional Limits for tasks assessed                                 General Comments: Pt demonstrated difficulty providing teach back for UB dressing compensatory technique. Needs cues for sequencing and precautions.        Exercises Other Exercises Other Exercises: OT provided education re: compensatory ADL management, UB dressing    Shoulder Instructions       General Comments      Pertinent Vitals/ Pain       Pain Assessment Pain Assessment: Faces Faces Pain Scale: Hurts a little bit Pain Location: R UE Pain Descriptors / Indicators: Grimacing, Dull, Discomfort Pain Intervention(s): Limited activity within patient's tolerance, Monitored during session, Premedicated before session, Repositioned  Home Living            Prior Functioning/Environment              Frequency  Min 2X/week        Progress Toward Goals  OT Goals(current goals can now be found in the care plan section)  Progress towards OT goals: Progressing toward goals  Acute Rehab OT Goals Patient Stated Goal: to feel better OT Goal Formulation: With patient Time For Goal Achievement: 12/13/22  Plan Discharge plan remains appropriate;Frequency remains appropriate    Co-evaluation                 AM-PAC OT "6 Clicks" Daily Activity     Outcome Measure   Help from another person eating meals?: A Little Help from another person taking care of personal grooming?: A Little Help from another person toileting, which includes using toliet, bedpan, or urinal?: A Lot Help from another person bathing (including washing, rinsing, drying)?: A Little Help from another person to put on and taking off regular upper body clothing?: A Lot Help from another person to put on and taking off regular lower body clothing?: A Little 6 Click Score: 16    End of Session  Equipment Utilized During Treatment: Other (comment) (R platform walker)  OT Visit Diagnosis: Other abnormalities of gait and mobility (R26.89);Pain Pain - Right/Left: Right Pain - part of body: Arm;Hand   Activity Tolerance Patient tolerated treatment well   Patient Left in chair;with call bell/phone within reach;with chair alarm set;with family/visitor present   Nurse Communication Mobility status        Time: [id number] OT Time Calculation (min): 21 min  Charges: OT General Charges $OT Visit: 1 Visit OT Treatments $Self Care/Home Management : 8-22 mins  [facility] MS, OTR/L ascom [phone]  11/30/22, 1:38 PM

## 2022-11-30 NOTE — TOC Progression Note (Signed)
Transition of Care [facility]) - Progression Note    Patient Details  Name: Kerri Mills MRN: [id number] Date of Birth: May 20, 1931  Transition of Care [facility]) CM/SW Contact  Conception Oms, RN Phone Number: 11/30/2022, 2:21 PM  Clinical Narrative:     Damaris Schooner with Hilda Blades the patient's daughter the patient will be going home with her, [address] [place], She will have a RW and 3 in1 delivered to the bedside by [employer], [facility] for [facility] services Daughter to transport  Expected Discharge Plan: [place] Barriers to Discharge: No Barriers Identified  Expected Discharge Plan and Services   Discharge Planning Services: CM Consult   Living arrangements for the past 2 months: Single Family Home                 DME Arranged: 3-N-1, Walker rolling DME Agency: [employer] Date DME Agency Contacted: 11/30/22 Time DME Agency Contacted: [phone] Representative spoke with at DME Agency: Cyril Mourning HH Arranged: PT, OT HH Agency: [place] Date [place]: 11/30/22 Time [place]: 1420 Representative spoke with at [place]: [place] ([place]) Interventions SDOH Screenings   Food Insecurity: No Food Insecurity (11/28/2022)  Housing: Low Risk  (11/28/2022)  Transportation Needs: No Transportation Needs (11/28/2022)  Utilities: Not At Risk (11/28/2022)  Depression (PHQ2-9): Low Risk  (10/11/2022)  Financial Resource Strain: Low Risk  (02/08/2022)  Physical Activity: Sufficiently Active (02/08/2022)  Social Connections: Unknown (02/08/2022)  Stress: No Stress Concern Present (02/08/2022)  Tobacco Use: Low Risk  (11/28/2022)    Readmission Risk Interventions     No data to display

## 2022-11-30 NOTE — Progress Notes (Signed)
Patient is not able to walk the distance required to go the bathroom, or he/she is unable to safely negotiate stairs required to access the bathroom.  A 3in1 BSC will alleviate this problem  

## 2022-11-30 NOTE — Hospital Course (Addendum)
Kerri Mills is a 89 y.o. female with medical history significant of tachybradycardia syndrome, persistent atrial fibrillation on Eliquis, bronchiectasis, hypertension, hyperlipidemia who presented to the ED 11/28/22 with complaints of right wrist pain after mechanical fall . She immediately started feeling pain with intensity 10/10 associated with swelling around the wrist. 03/04: VSS, X-ray R wrist (+)Comminuted, displaced fractures are seen in distal right radius and ulna. Patient underwent manipulation and reduction in the emergency room by ED physician. Postreduction imaging showed some improvement. ED physician discussed the case with orthopedics Dr. Mack Guise --> no surgery, plan for splinting and IV cefazolin every 8 hours and then discharge when stable on oral Keflex and Bactrim to follow-up as an outpatient (abx recs given small area of opening in the skin to prevent superimposed infection at the fracture site).  03/05: recs for [facility] PT/OT. Ortho recs --> continue splinting and antibiotics. "Once the swelling has improved the patient could undergo ORIF of her right distal both bone forearm fracture.  Patient will continue elevation right upper extremity.  She must use a platform walker and may not weight-bear through her right wrist.  Patient should remain on PO antibiotics until follow up given her skin tear." F/u office in 1 week.  03/06: stable medically for discharge, DME and [place] arranged   Consultants:  Orthopedics  Procedures: none      ASSESSMENT & PLAN:    Acute comminuted and displaced fracture involving the right wrist PT OT --> home health  Continue pain regimen At discharge orthopedic surgeon recommends Keflex and Bactrim   Rales on lung exam, (+)Orthopnea Pt on intermittent dosing lasix prior to admission Advised low dose daily lasix on discharge, parameters reviewed for increased dose / precautions to seek care and/or contact cardiology office  Pt has f/u scheduled w/  cardiology next week  Hypercholesterolemia Continue pravastatin   Hypothyroidism, unspecified type Continue levothyroxine   Essential hypertension Continue spironolactone, metoprolol   Aortic atherosclerosis ([place]) Continue statin therapy   Atrial fibrillation, unspecified type ([place]) Continue metoprolol for rate control Holding Eliquis at this time in the setting of acute fracture to prevent bleeding   Bronchiectasis without complication ([place]) Continue as needed nebulization   Gastroesophageal reflux disease without esophagitis Continue omeprazole   History of breast cancer Continue outpatient surveillance   History of thyroid cancer On thyroid replacement.     Obstructive sleep apnea Continue cpap at night    Tachy-brady syndrome (HCC) Continue metoprolol

## 2022-11-30 NOTE — Discharge Summary (Signed)
Physician Discharge Summary   Patient: Kerri Mills MRN: [id number]  DOB: 1931-02-18   Admit:     Date of Admission: 11/28/2022 Admitted from: home   Discharge: Date of discharge: 11/30/22 Disposition: Home health Condition at discharge: good  CODE STATUS: FULL CODE     Discharge Physician: Emeterio Reeve, DO [facility]     PCP: Einar Pheasant, MD  Recommendations for Outpatient Follow-up:  Follow up with PCP Einar Pheasant, MD in 1-2 weeks Please obtain labs/tests: BMP, CBC in 1-2 weeks Follow w/ cardiologist as scheduled Follow in 1 week w/ orthopedics  Please follow up on the following pending results: none     Discharge Instructions     Diet - low sodium heart healthy   Complete by: As directed    Increase activity slowly   Complete by: As directed          Discharge Diagnoses: Principal Problem:   Right wrist fracture       Hospital Course: Kerri Mills is a 89 y.o. female with medical history significant of tachybradycardia syndrome, persistent atrial fibrillation on Eliquis, bronchiectasis, hypertension, hyperlipidemia who presented to the ED 11/28/22 with complaints of right wrist pain after mechanical fall . She immediately started feeling pain with intensity 10/10 associated with swelling around the wrist. 03/04: VSS, X-ray R wrist (+)Comminuted, displaced fractures are seen in distal right radius and ulna. Patient underwent manipulation and reduction in the emergency room by ED physician. Postreduction imaging showed some improvement. ED physician discussed the case with orthopedics Dr. Mack Guise --> no surgery, plan for splinting and IV cefazolin every 8 hours and then discharge when stable on oral Keflex and Bactrim to follow-up as an outpatient (abx recs given small area of opening in the skin to prevent superimposed infection at the fracture site).  03/05: recs for [facility] PT/OT. Ortho recs --> continue splinting and antibiotics.  "Once the swelling has improved the patient could undergo ORIF of her right distal both bone forearm fracture.  Patient will continue elevation right upper extremity.  She must use a platform walker and may not weight-bear through her right wrist.  Patient should remain on PO antibiotics until follow up given her skin tear." F/u office in 1 week.  03/06: stable medically for discharge, DME and [place] arranged   Consultants:  Orthopedics  Procedures: none      ASSESSMENT & PLAN:    Acute comminuted and displaced fracture involving the right wrist PT OT --> home health  Continue pain regimen At discharge orthopedic surgeon recommends Keflex and Bactrim   Rales on lung exam, (+)Orthopnea Pt on intermittent dosing lasix prior to admission Advised low dose daily lasix on discharge, parameters reviewed for increased dose / precautions to seek care and/or contact cardiology office  Pt has f/u scheduled w/ cardiology next week  Hypercholesterolemia Continue pravastatin   Hypothyroidism, unspecified type Continue levothyroxine   Essential hypertension Continue spironolactone, metoprolol   Aortic atherosclerosis ([place]) Continue statin therapy   Atrial fibrillation, unspecified type ([place]) Continue metoprolol for rate control Holding Eliquis at this time in the setting of acute fracture to prevent bleeding   Bronchiectasis without complication ([place]) Continue as needed nebulization   Gastroesophageal reflux disease without esophagitis Continue omeprazole   History of breast cancer Continue outpatient surveillance   History of thyroid cancer On thyroid replacement.     Obstructive sleep apnea Continue cpap at night    Tachy-brady syndrome (HCC) Continue metoprolol  Discharge Instructions  Allergies as of 11/30/2022       Reactions   Lipitor [atorvastatin Calcium] Other (See Comments)   Stiffness & soreness   Penicillins Rash        Medication  List     TAKE these medications    acetaminophen 650 MG CR tablet Commonly known as: TYLENOL Take 650 mg by mouth every 8 (eight) hours as needed for pain.   albuterol 108 (90 Base) MCG/ACT inhaler Commonly known as: VENTOLIN HFA Inhale 2 puffs into the lungs every 6 (six) hours as needed for wheezing or shortness of breath.   Align 4 MG Caps One capsule daily while on antibiotic and for two weeks after complete antibiotic   amiodarone 200 MG tablet Commonly known as: PACERONE TAKE 1 TABLET BY MOUTH DAILY   azelastine 0.1 % nasal spray Commonly known as: ASTELIN Place 1 spray into both nostrils 2 (two) times daily. Use in each nostril as directed   Breztri Aerosphere 160-9-4.8 MCG/ACT Aero Generic drug: Budeson-Glycopyrrol-Formoterol Inhale 2 puffs into the lungs in the morning and at bedtime.   cephALEXin 500 MG capsule Commonly known as: KEFLEX Take 1 capsule (500 mg total) by mouth 2 (two) times daily.   cetirizine 5 MG tablet Commonly known as: ZYRTEC Take 1 tablet (5 mg total) by mouth daily.   cholecalciferol 25 MCG (1000 UNIT) tablet Commonly known as: VITAMIN D3 Take 2,000 Units by mouth daily.   DULoxetine 60 MG capsule Commonly known as: CYMBALTA TAKE ONE CAPSULE AT BEDTIME   Eliquis 5 MG Tabs tablet Generic drug: apixaban TAKE ONE TABLET BY MOUTH TWICE DAILY   furosemide 20 MG tablet Commonly known as: LASIX Take 0.5 tablets (10 mg total) by mouth daily. Increase to 1 tablet (20 mg total) by mouth ONCE OR TWICE daily (total daily dose maximum 40 mg) as needed for up to 3 days for increased leg swelling, shortness of breath on exertion, trouble breathing lying flat on your back, weight gain 5+ lbs over 1-2 days. Seek medical care if these symptoms are not improving with increased dose. What changed:  how much to take how to take this when to take this additional instructions   gabapentin 300 MG capsule Commonly known as: NEURONTIN TAKE 2 CAPSULES  BY MOUTH AT BEDTIME   GENTEAL TEARS NIGHT-TIME OP Place 1 application into both eyes at bedtime. Night time ointment 3.5g   levothyroxine 125 MCG tablet Commonly known as: SYNTHROID Take 1 tablet (125 mcg total) by mouth daily.   lovastatin 40 MG tablet Commonly known as: MEVACOR TAKE 1 TABLET BY MOUTH DAILY What changed: when to take this   Metoprolol Tartrate 75 MG Tabs Take 1 tablet (75 mg total) by mouth 2 (two) times daily.   omeprazole 20 MG capsule Commonly known as: PRILOSEC TAKE 1 CAPSULE BY MOUTH ONCE DAILY   oxyCODONE 5 MG immediate release tablet Commonly known as: Oxy IR/ROXICODONE Take 1-2 tablets (5-10 mg total) by mouth every 6 (six) hours as needed for moderate pain or severe pain.   polyethylene glycol powder 17 GM/SCOOP powder Commonly known as: GLYCOLAX/MIRALAX Take 17 g by mouth daily.   potassium chloride 10 MEQ tablet Commonly known as: KLOR-CON M Take 1 tablet (10 mEq total) by mouth daily. What changed:  how much to take how to take this when to take this additional instructions   PRESERVISION AREDS 2+MULTI VIT PO Take 1 tablet by mouth 2 (two) times daily.   spironolactone 25 MG  tablet Commonly known as: ALDACTONE Take 1 tablet (25 mg total) by mouth daily.   sulfamethoxazole-trimethoprim 800-160 MG tablet Commonly known as: BACTRIM DS Take 1 tablet by mouth 2 (two) times daily for 5 days.   SYSTANE OP Place 1 drop into both eyes daily as needed (for dry eyes).   triamcinolone cream 0.1 % Commonly known as: KENALOG Apply 1 application. topically 2 (two) times daily.               Durable Medical Equipment  (From admission, onward)           Start     Ordered   11/30/22 1414  For home use only DME Walker rolling  Once       Question Answer Comment  Walker: With [place] Wheels   Patient needs a walker to treat with the following condition Impaired mobility      11/30/22 1415   11/30/22 1414  For home use only DME  Bedside commode  Once       Question:  Patient needs a bedside commode to treat with the following condition  Answer:  Impaired mobility   11/30/22 1415   11/29/22 1902  For home use only DME Walker platform  Once       Question:  Patient needs a walker to treat with the following condition  Answer:  Distal radius fracture, right   11/28/22 1903   11/29/22 1047  For home use only DME 3 n 1  Once        11/29/22 1046             Follow-up Information     Thornton Park, MD. Call.   Specialty: Orthopedic Surgery Why: confirm follow up appointmetn in 1 week or sooner if needed Contact information: [place] [address] [phone]                 Allergies  Allergen Reactions   Lipitor [Atorvastatin Calcium] Other (See Comments)    Stiffness & soreness   Penicillins Rash     Subjective: pt reports some SOB w/ activity but this is normal fo her, pain is controlled    Discharge Exam: BP 113/83 (BP Location: Left Arm)   Pulse 80   Temp 98.4 F (36.9 C)   Resp 15   Ht '5\' 5"'$  (1.651 m)   Wt 77.5 kg   SpO2 (!) 88%   BMI 28.42 kg/m  General: Pt is alert, awake, not in acute distress Cardiovascular: RRR, S1/S2 +, no rubs, no gallops Respiratory: rales bilateral bases, no wheezing, no rhonchi Abdominal: Soft, NT, ND, bowel sounds + Extremities: no edema, no cyanosis     The results of significant diagnostics from this hospitalization (including imaging, microbiology, ancillary and laboratory) are listed below for reference.     Microbiology: No results found for this or any previous visit (from the past 240 hour(s)).   Labs: BNP (last 3 results) No results for input(s): "BNP" in the last 8760 hours. Basic Metabolic Panel: Recent Labs  Lab 11/28/22 0920 11/28/22 1544 11/30/22 0502  NA 140 137 134*  K 5.0 4.6 3.7  CL 99 100 97*  CO2 32 26 28  GLUCOSE 147* 128* 139*  BUN '20 22 13  '$ CREATININE 1.09 0.92 0.97  CALCIUM 10.4 9.7  9.6   Liver Function Tests: Recent Labs  Lab 11/28/22 0920  AST 23  ALT 21  ALKPHOS 125*  BILITOT 0.5  PROT 6.5  ALBUMIN 3.7   No results for input(s): "LIPASE", "AMYLASE" in the last 168 hours. No results for input(s): "AMMONIA" in the last 168 hours. CBC: Recent Labs  Lab 11/28/22 1544 11/29/22 0840 11/30/22 0502  WBC 8.7 8.1 8.8  NEUTROABS 5.8 5.6 5.4  HGB 14.1 12.4 13.5  HCT 44.7 38.5 43.1  MCV 97.8 95.3 97.1  PLT 237 187 199   Cardiac Enzymes: No results for input(s): "CKTOTAL", "CKMB", "CKMBINDEX", "TROPONINI" in the last 168 hours. BNP: Invalid input(s): "POCBNP" CBG: No results for input(s): "GLUCAP" in the last 168 hours. D-Dimer No results for input(s): "DDIMER" in the last 72 hours. Hgb A1c Recent Labs    11/28/22 0920  HGBA1C 6.5   Lipid Profile Recent Labs    11/28/22 0920  CHOL 177  HDL 42.30  LDLCALC 99  TRIG 177.0*  CHOLHDL 4   Thyroid function studies Recent Labs    11/28/22 0920  TSH 3.17   Anemia work up No results for input(s): "VITAMINB12", "FOLATE", "FERRITIN", "TIBC", "IRON", "RETICCTPCT" in the last 72 hours. Urinalysis    Component Value Date/Time   COLORURINE YELLOW 09/22/2020 1117   APPEARANCEUR CLEAR 09/22/2020 1117   LABSPEC 1.015 09/22/2020 1117   PHURINE 6.0 09/22/2020 1117   GLUCOSEU NEGATIVE 09/22/2020 1117   HGBUR NEGATIVE 09/22/2020 1117   BILIRUBINUR small 10/26/2021 1433   KETONESUR NEGATIVE 09/22/2020 1117   PROTEINUR Positive (A) 10/26/2021 1433   PROTEINUR NEGATIVE 01/08/2020 1736   UROBILINOGEN >=8.0 (A) 10/26/2021 1433   UROBILINOGEN 1.0 09/22/2020 1117   NITRITE positive 10/26/2021 1433   NITRITE Color Interference (A) 09/22/2020 1117   LEUKOCYTESUR Large (3+) (A) 10/26/2021 1433   LEUKOCYTESUR TRACE (A) 09/22/2020 1117   Sepsis Labs Recent Labs  Lab 11/28/22 1544 11/29/22 0840 11/30/22 0502  WBC 8.7 8.1 8.8   Microbiology No results found for this or any previous visit (from the past  240 hour(s)). Imaging CT WRIST RIGHT WO CONTRAST  Result Date: 11/28/2022 CLINICAL DATA:  Fall EXAM: CT OF THE RIGHT WRIST WITHOUT CONTRAST TECHNIQUE: Multidetector CT imaging of the right wrist was performed according to the standard protocol. Multiplanar CT image reconstructions were also generated. RADIATION DOSE REDUCTION: This exam was performed according to the departmental dose-optimization program which includes automated exposure control, adjustment of the mA and/or kV according to patient size and/or use of iterative reconstruction technique. COMPARISON:  Right wrist x-ray 11/28/2022 FINDINGS: Bones/Joint/Cartilage There is an acute distal radius fracture, transverse. There is 1/2 shaft with posterior displacement of the distal fracture fragment with mild apex anterior angulation and 1 cm of overlap. There is an acute oblique fracture of the distal ulna which is in near anatomic alignment with mild apex anterior angulation. There is a nondisplaced ulnar styloid fracture. There is no evidence for dislocation. Ligaments Suboptimally assessed by CT. Muscles and Tendons No focal fluid collection or foreign body identified. Soft tissues There is soft tissue swelling and edema surrounding the fractures. IMPRESSION: 1. Acute displaced and angulated distal radius fracture. 2. Acute oblique fracture of the distal ulna with mild angulation. 3. Nondisplaced ulnar styloid fracture. Electronically Signed   By: Ronney Asters M.D.   On: 11/28/2022 23:04   DG Wrist 2 Views Right  Result Date: 11/28/2022 CLINICAL DATA:  Distal radial and ulnar fracture reduction EXAM: RIGHT WRIST - 2 VIEW COMPARISON:  11/28/2022 FINDINGS: Fiberglass splint obscures bony detail. Displaced Colles fracture is again noted with currently about 1.3 cm posterior displacement of the distal radial fragment with  respect to the proximal, improved from previous 16 mm and previous overlap. Reduced apex volar angulation. Improved alignment at the  distal ulnar metaphysis oblique fracture site. IMPRESSION: 1. Mild improved alignment at the distal radial fracture although there still 1.3 cm posterior displacement of the distal fragment with respect to the proximal. Improved alignment at the distal ulnar fracture site. Electronically Signed   By: Van Clines M.D.   On: 11/28/2022 18:09   CT Head Wo Contrast  Result Date: 11/28/2022 CLINICAL DATA:  Head trauma, minor (Age >= 65y); Neck trauma (Age >= 65y) EXAM: CT HEAD WITHOUT CONTRAST CT CERVICAL SPINE WITHOUT CONTRAST TECHNIQUE: Multidetector CT imaging of the head and cervical spine was performed following the standard protocol without intravenous contrast. Multiplanar CT image reconstructions of the cervical spine were also generated. RADIATION DOSE REDUCTION: This exam was performed according to the departmental dose-optimization program which includes automated exposure control, adjustment of the mA and/or kV according to patient size and/or use of iterative reconstruction technique. COMPARISON:  January 08, 2020. FINDINGS: CT HEAD FINDINGS Brain: No evidence of acute infarction, hemorrhage, hydrocephalus, extra-axial collection or mass lesion/mass effect. Vascular: No hyperdense vessel identified. Skull: No fracture. Sinuses/Orbits: Clear sinuses.  No acute orbital findings. Other: No mastoid effusions. CT CERVICAL SPINE FINDINGS Alignment: Similar alignment in comparison to the prior. No new sagittal subluxation. Skull base and vertebrae: Vertebral body heights maintained. No evidence of acute fracture. Soft tissues and spinal canal: No prevertebral fluid or swelling. No visible canal hematoma. Disc levels: Similar severe multilevel degenerative change including facet uncovertebral hypertrophy with varying degrees of neural foraminal stenosis. Upper chest: Visualized lung apices are clear. IMPRESSION: No acute intracranial or cervical spine findings. Electronically Signed   By: Margaretha Sheffield  M.D.   On: 11/28/2022 16:08   CT Cervical Spine Wo Contrast  Result Date: 11/28/2022 CLINICAL DATA:  Head trauma, minor (Age >= 65y); Neck trauma (Age >= 65y) EXAM: CT HEAD WITHOUT CONTRAST CT CERVICAL SPINE WITHOUT CONTRAST TECHNIQUE: Multidetector CT imaging of the head and cervical spine was performed following the standard protocol without intravenous contrast. Multiplanar CT image reconstructions of the cervical spine were also generated. RADIATION DOSE REDUCTION: This exam was performed according to the departmental dose-optimization program which includes automated exposure control, adjustment of the mA and/or kV according to patient size and/or use of iterative reconstruction technique. COMPARISON:  January 08, 2020. FINDINGS: CT HEAD FINDINGS Brain: No evidence of acute infarction, hemorrhage, hydrocephalus, extra-axial collection or mass lesion/mass effect. Vascular: No hyperdense vessel identified. Skull: No fracture. Sinuses/Orbits: Clear sinuses.  No acute orbital findings. Other: No mastoid effusions. CT CERVICAL SPINE FINDINGS Alignment: Similar alignment in comparison to the prior. No new sagittal subluxation. Skull base and vertebrae: Vertebral body heights maintained. No evidence of acute fracture. Soft tissues and spinal canal: No prevertebral fluid or swelling. No visible canal hematoma. Disc levels: Similar severe multilevel degenerative change including facet uncovertebral hypertrophy with varying degrees of neural foraminal stenosis. Upper chest: Visualized lung apices are clear. IMPRESSION: No acute intracranial or cervical spine findings. Electronically Signed   By: Margaretha Sheffield M.D.   On: 11/28/2022 16:08   DG Elbow 2 Views Right  Result Date: 11/28/2022 CLINICAL DATA:  Fall, pain EXAM: RIGHT ELBOW - 2 VIEW COMPARISON:  None Available. FINDINGS: Suboptimal positioning on the lateral view, which limits assessment. Within this limitation, there is no evidence of fracture or  dislocation. No large elbow joint effusion. No significant arthropathy. No focal soft tissue abnormality.  IMPRESSION: Slightly limited exam without evidence of fracture or dislocation. Electronically Signed   By: Davina Poke D.O.   On: 11/28/2022 16:03   DG Wrist Complete Right  Result Date: 11/28/2022 CLINICAL DATA:  Trauma, fall EXAM: RIGHT WRIST - COMPLETE 3+ VIEW COMPARISON:  05/19/2011 FINDINGS: There is a comminuted fracture in the distal metaphysis and distal end of ulna. Fracture line is involving the base of the ulnar styloid. There is angulation at the fracture site. There is comminuted displaced fracture in the distal radius. There is dorsal displacement of distal fracture fragment along with the carpals in relation to the distal shaft of radius. Degenerative changes are noted in multiple joints, more so in the interphalangeal joint of the right thumb. IMPRESSION: Comminuted, displaced fractures are seen in distal right radius and ulna. Electronically Signed   By: Elmer Picker M.D.   On: 11/28/2022 14:57      Time coordinating discharge: over 30 minutes  SIGNED:  Emeterio Reeve DO [facility]

## 2022-12-01 ENCOUNTER — Ambulatory Visit: Payer: PPO | Admitting: Internal Medicine

## 2022-12-01 ENCOUNTER — Telehealth: Payer: Self-pay

## 2022-12-01 NOTE — Transitions of Care (Post Inpatient/ED Visit) (Signed)
   12/01/2022  Name: Kerri Mills MRN: [id number] DOB: 1931-07-30  Today's TOC FU Call Status: Today's TOC FU Call Status:: Unsuccessul Call (1st Attempt) Unsuccessful Call (1st Attempt) Date: 12/01/22  Attempted to reach the patient regarding the most recent Inpatient/ED visit.  Follow Up Plan: Additional outreach attempts will be made to reach the patient to complete the Transitions of Care (Post Inpatient/ED visit) call.   Johnney Killian, RN, BSN, CCM Care Management Coordinator [place] Phone: [phone]: [phone]

## 2022-12-02 ENCOUNTER — Other Ambulatory Visit: Payer: Self-pay | Admitting: Orthopedic Surgery

## 2022-12-02 ENCOUNTER — Telehealth: Payer: Self-pay

## 2022-12-02 NOTE — Transitions of Care (Post Inpatient/ED Visit) (Signed)
   12/02/2022  Name: Kerri Mills MRN: [id number] DOB: 1931/01/21  Today's TOC FU Call Status: Today's TOC FU Call Status:: Successful TOC FU Call Competed TOC FU Call Complete Date: 12/02/22  Transition Care Management Follow-up Telephone Call Date of Discharge: 11/30/22 Discharge Facility: [facility]) Type of Discharge: Inpatient Admission Primary Inpatient Discharge Diagnosis:: Right Wrist Open fracture How have you been since you were released from the hospital?: Better (Patients daughter notes patients pain is better controlled) Any questions or concerns?: No  Items Reviewed: Did you receive and understand the discharge instructions provided?: Yes Medications obtained and verified?: Yes (Medications Reviewed) Any new allergies since your discharge?: No Dietary orders reviewed?: No Do you have support at home?: Yes People in Home: child(ren), adult Name of Support/Comfort Primary Source: [place]- daughter  Home Care and Equipment/Supplies: [place] Ordered?: Yes Name of [place]:: [place] set up a time to come to your home?: Yes [place] Visit Date: 12/03/22 Any new equipment or medical supplies ordered?: Yes Name of Medical supply agency?: [employer] Were you able to get the equipment/medical supplies?: Yes Do you have any questions related to the use of the equipment/supplies?: No  Functional Questionnaire: Do you need assistance with bathing/showering or dressing?: Yes Do you need assistance with meal preparation?: Yes Do you need assistance with eating?: No Do you have difficulty maintaining continence: No Do you need assistance with getting out of bed/getting out of a chair/moving?: Yes Do you have difficulty managing or taking your medications?: Yes  Folllow up appointments reviewed: PCP Follow-up appointment confirmed?: Yes Date of PCP follow-up appointment?: 12/26/22 Follow-up Provider:  Dr. Nicki Reaper Specialist [facility] Follow-up appointment confirmed?: Yes Date of Specialist follow-up appointment?: 12/05/22 Follow-Up Specialty Provider:: Dr. Mack Guise Do you need transportation to your follow-up appointment?: No Do you understand care options if your condition(s) worsen?: Yes-patient verbalized understanding  Johnney Killian, RN, BSN, CCM Care Management Coordinator [facility] Phone: [phone]: [phone]

## 2022-12-03 DIAGNOSIS — E039 Hypothyroidism, unspecified: Secondary | ICD-10-CM | POA: Diagnosis not present

## 2022-12-03 DIAGNOSIS — I700 Atherosclerosis of aorta: Secondary | ICD-10-CM | POA: Diagnosis not present

## 2022-12-03 DIAGNOSIS — I4819 Other persistent atrial fibrillation: Secondary | ICD-10-CM | POA: Diagnosis not present

## 2022-12-03 DIAGNOSIS — I495 Sick sinus syndrome: Secondary | ICD-10-CM | POA: Diagnosis not present

## 2022-12-03 DIAGNOSIS — S52251D Displaced comminuted fracture of shaft of ulna, right arm, subsequent encounter for closed fracture with routine healing: Secondary | ICD-10-CM | POA: Diagnosis not present

## 2022-12-03 DIAGNOSIS — E7800 Pure hypercholesterolemia, unspecified: Secondary | ICD-10-CM | POA: Diagnosis not present

## 2022-12-03 DIAGNOSIS — G4733 Obstructive sleep apnea (adult) (pediatric): Secondary | ICD-10-CM | POA: Diagnosis not present

## 2022-12-03 DIAGNOSIS — Z7901 Long term (current) use of anticoagulants: Secondary | ICD-10-CM | POA: Diagnosis not present

## 2022-12-03 DIAGNOSIS — S52351D Displaced comminuted fracture of shaft of radius, right arm, subsequent encounter for closed fracture with routine healing: Secondary | ICD-10-CM | POA: Diagnosis not present

## 2022-12-03 DIAGNOSIS — J479 Bronchiectasis, uncomplicated: Secondary | ICD-10-CM | POA: Diagnosis not present

## 2022-12-05 ENCOUNTER — Encounter: Payer: Self-pay | Admitting: Cardiology

## 2022-12-05 ENCOUNTER — Inpatient Hospital Stay
Admission: RE | Admit: 2022-12-05 | Discharge: 2022-12-05 | Disposition: A | Discharge status: Home / Self Care | Payer: PPO | Source: Ambulatory Visit

## 2022-12-05 DIAGNOSIS — S52501A Unspecified fracture of the lower end of right radius, initial encounter for closed fracture: Secondary | ICD-10-CM | POA: Diagnosis not present

## 2022-12-05 MED ORDER — FAMOTIDINE 20 MG PO TABS: 20.0000 mg | ORAL_TABLET | Freq: Once | ORAL | Status: AC

## 2022-12-05 MED ORDER — CHLORHEXIDINE GLUCONATE CLOTH 2 % EX PADS: 6.0000 | MEDICATED_PAD | Freq: Once | CUTANEOUS | Status: DC

## 2022-12-05 MED ORDER — ACETAMINOPHEN 500 MG PO TABS: 1000.0000 mg | ORAL_TABLET | ORAL | Status: AC

## 2022-12-05 MED ORDER — CHLORHEXIDINE GLUCONATE 0.12 % MT SOLN: 15.0000 mL | Freq: Once | OROMUCOSAL | Status: AC

## 2022-12-05 MED ORDER — LACTATED RINGERS IV SOLN: INTRAVENOUS | Status: DC

## 2022-12-05 MED ORDER — ORAL CARE MOUTH RINSE: 15.0000 mL | Freq: Once | OROMUCOSAL | Status: AC

## 2022-12-05 MED ORDER — CEFAZOLIN SODIUM-DEXTROSE 2-4 GM/100ML-% IV SOLN
2.0000 g | INTRAVENOUS | Status: AC
  Administered 2022-12-06: 2 g via INTRAVENOUS

## 2022-12-05 NOTE — Progress Notes (Signed)
[place] DEVICE PROGRAMMING  Patient Information: Name:  Kerri Mills  DOB:  12-09-1930  MRN:  [id number]  Planned Procedure: OPEN REDUCTION INTERNAL FIXATION (ORIF) DISTAL RADIUS FRACTURE (Right)    Surgeon:  Dr. Thornton Park, MD  Requesting device clearance: Honor Loh, FNP-C  Date of Procedure:  12/06/2022  Cautery will be used.   Device Information:  Clinic EP Physician:  Allegra Lai, MD   Device Type:  Pacemaker Manufacturer and Phone #:  [employer]: [phone] Pacemaker Dependent?:  Yes.   Date of Last Device Check:  11/15/2022 Normal Device Function?:  Yes.    Electrophysiologist's Recommendations:  Have magnet available. Provide continuous ECG monitoring when magnet is used or reprogramming is to be performed.  Procedure should not interfere with device function.  No device programming or magnet placement needed.  Per Device Clinic Standing Orders, Damian Leavell, RN  2:07 PM 12/05/2022

## 2022-12-05 NOTE — Pre-Procedure Instructions (Signed)
Attempts x 2 to reach patient unsuccessful. Messages left. Pre op aware patient will need to be 2 hours early.

## 2022-12-06 ENCOUNTER — Encounter
Admission: RE | Disposition: A | Discharge status: Home / Self Care | Payer: Self-pay | Source: Home / Self Care | Attending: Orthopedic Surgery

## 2022-12-06 ENCOUNTER — Ambulatory Visit: Payer: PPO

## 2022-12-06 ENCOUNTER — Other Ambulatory Visit: Payer: Self-pay

## 2022-12-06 ENCOUNTER — Ambulatory Visit: Payer: PPO | Admitting: Urgent Care

## 2022-12-06 ENCOUNTER — Encounter: Payer: Self-pay | Admitting: Orthopedic Surgery

## 2022-12-06 ENCOUNTER — Ambulatory Visit: Payer: PPO | Admitting: Anesthesiology

## 2022-12-06 ENCOUNTER — Other Ambulatory Visit: Payer: Self-pay | Admitting: Cardiovascular Disease

## 2022-12-06 ENCOUNTER — Other Ambulatory Visit (HOSPITAL_BASED_OUTPATIENT_CLINIC_OR_DEPARTMENT_OTHER): Payer: PPO

## 2022-12-06 ENCOUNTER — Observation Stay
Admission: RE | Admit: 2022-12-06 | Discharge: 2022-12-07 | Disposition: A | Discharge status: Home / Self Care | Payer: PPO | Attending: Orthopedic Surgery | Admitting: Orthopedic Surgery

## 2022-12-06 DIAGNOSIS — X58XXXA Exposure to other specified factors, initial encounter: Secondary | ICD-10-CM | POA: Insufficient documentation

## 2022-12-06 DIAGNOSIS — Z85828 Personal history of other malignant neoplasm of skin: Secondary | ICD-10-CM | POA: Diagnosis not present

## 2022-12-06 DIAGNOSIS — Z950 Presence of cardiac pacemaker: Secondary | ICD-10-CM | POA: Insufficient documentation

## 2022-12-06 DIAGNOSIS — I4819 Other persistent atrial fibrillation: Secondary | ICD-10-CM | POA: Insufficient documentation

## 2022-12-06 DIAGNOSIS — Z9181 History of falling: Secondary | ICD-10-CM | POA: Diagnosis not present

## 2022-12-06 DIAGNOSIS — S52501D Unspecified fracture of the lower end of right radius, subsequent encounter for closed fracture with routine healing: Secondary | ICD-10-CM | POA: Diagnosis not present

## 2022-12-06 DIAGNOSIS — Z85850 Personal history of malignant neoplasm of thyroid: Secondary | ICD-10-CM | POA: Insufficient documentation

## 2022-12-06 DIAGNOSIS — S52501A Unspecified fracture of the lower end of right radius, initial encounter for closed fracture: Principal | ICD-10-CM | POA: Insufficient documentation

## 2022-12-06 DIAGNOSIS — I10 Essential (primary) hypertension: Secondary | ICD-10-CM | POA: Insufficient documentation

## 2022-12-06 DIAGNOSIS — Z7901 Long term (current) use of anticoagulants: Secondary | ICD-10-CM | POA: Diagnosis not present

## 2022-12-06 DIAGNOSIS — Z79899 Other long term (current) drug therapy: Secondary | ICD-10-CM | POA: Diagnosis not present

## 2022-12-06 DIAGNOSIS — E039 Hypothyroidism, unspecified: Secondary | ICD-10-CM | POA: Diagnosis not present

## 2022-12-06 DIAGNOSIS — Z4789 Encounter for other orthopedic aftercare: Secondary | ICD-10-CM | POA: Diagnosis not present

## 2022-12-06 DIAGNOSIS — I4891 Unspecified atrial fibrillation: Secondary | ICD-10-CM | POA: Diagnosis not present

## 2022-12-06 DIAGNOSIS — S52551A Other extraarticular fracture of lower end of right radius, initial encounter for closed fracture: Secondary | ICD-10-CM | POA: Diagnosis not present

## 2022-12-06 DIAGNOSIS — G8918 Other acute postprocedural pain: Secondary | ICD-10-CM | POA: Diagnosis not present

## 2022-12-06 HISTORY — PX: OPEN REDUCTION INTERNAL FIXATION (ORIF) DISTAL RADIAL FRACTURE: SHX5989

## 2022-12-06 SURGERY — OPEN REDUCTION INTERNAL FIXATION (ORIF) DISTAL RADIUS FRACTURE
Anesthesia: General | Site: Wrist | Laterality: Right

## 2022-12-06 MED ORDER — ONDANSETRON HCL 4 MG/2ML IJ SOLN: 4.0000 mg | Freq: Once | INTRAMUSCULAR | Status: DC | PRN

## 2022-12-06 MED ORDER — BUPIVACAINE HCL (PF) 0.5 % IJ SOLN
INTRAMUSCULAR | Status: AC
  Filled 2022-12-06: qty 10

## 2022-12-06 MED ORDER — LIDOCAINE HCL (PF) 1 % IJ SOLN
INTRAMUSCULAR | Status: DC | PRN
  Administered 2022-12-06: 1 mL via SUBCUTANEOUS

## 2022-12-06 MED ORDER — CHLORHEXIDINE GLUCONATE 0.12 % MT SOLN
OROMUCOSAL | Status: AC
  Administered 2022-12-06: 15 mL via OROMUCOSAL
  Filled 2022-12-06: qty 15

## 2022-12-06 MED ORDER — ALBUTEROL SULFATE (2.5 MG/3ML) 0.083% IN NEBU: 3.0000 mL | INHALATION_SOLUTION | Freq: Four times a day (QID) | RESPIRATORY_TRACT | Status: DC | PRN

## 2022-12-06 MED ORDER — DULOXETINE HCL 30 MG PO CPEP
60.0000 mg | ORAL_CAPSULE | Freq: Every day | ORAL | Status: DC
  Administered 2022-12-06: 60 mg via ORAL
  Filled 2022-12-06: qty 2

## 2022-12-06 MED ORDER — MORPHINE SULFATE (PF) 2 MG/ML IV SOLN: 2.0000 mg | INTRAVENOUS | Status: DC | PRN

## 2022-12-06 MED ORDER — CEFAZOLIN SODIUM-DEXTROSE 2-4 GM/100ML-% IV SOLN
INTRAVENOUS | Status: AC
  Filled 2022-12-06: qty 100

## 2022-12-06 MED ORDER — ONDANSETRON HCL 4 MG PO TABS: 4.0000 mg | ORAL_TABLET | Freq: Three times a day (TID) | ORAL | 0 refills | Status: DC | PRN

## 2022-12-06 MED ORDER — FENTANYL CITRATE (PF) 100 MCG/2ML IJ SOLN
INTRAMUSCULAR | Status: AC
  Administered 2022-12-06: 25 ug via INTRAVENOUS
  Filled 2022-12-06: qty 2

## 2022-12-06 MED ORDER — SODIUM CHLORIDE 0.9 % IV SOLN: INTRAVENOUS | Status: DC

## 2022-12-06 MED ORDER — KETOROLAC TROMETHAMINE 30 MG/ML IJ SOLN
INTRAMUSCULAR | Status: DC | PRN
  Administered 2022-12-06: 15 mg via INTRAVENOUS

## 2022-12-06 MED ORDER — OXYCODONE HCL 5 MG PO TABS
ORAL_TABLET | ORAL | Status: AC
  Filled 2022-12-06: qty 1

## 2022-12-06 MED ORDER — LIDOCAINE HCL (CARDIAC) PF 100 MG/5ML IV SOSY
PREFILLED_SYRINGE | INTRAVENOUS | Status: DC | PRN
  Administered 2022-12-06: 80 mg via INTRAVENOUS

## 2022-12-06 MED ORDER — ACETAMINOPHEN 325 MG PO TABS: 325.0000 mg | ORAL_TABLET | ORAL | Status: DC | PRN

## 2022-12-06 MED ORDER — DOCUSATE SODIUM 100 MG PO CAPS
100.0000 mg | ORAL_CAPSULE | Freq: Two times a day (BID) | ORAL | Status: DC
  Administered 2022-12-06 – 2022-12-07 (×2): 100 mg via ORAL
  Filled 2022-12-06 (×2): qty 1

## 2022-12-06 MED ORDER — ONDANSETRON HCL 4 MG/2ML IJ SOLN
INTRAMUSCULAR | Status: DC | PRN
  Administered 2022-12-06: 4 mg via INTRAVENOUS

## 2022-12-06 MED ORDER — DEXAMETHASONE SODIUM PHOSPHATE 10 MG/ML IJ SOLN
INTRAMUSCULAR | Status: DC | PRN
  Administered 2022-12-06: 4 mg via INTRAVENOUS

## 2022-12-06 MED ORDER — OXYCODONE HCL 5 MG/5ML PO SOLN: 5.0000 mg | Freq: Once | ORAL | Status: AC | PRN

## 2022-12-06 MED ORDER — HYDROCODONE-ACETAMINOPHEN 7.5-325 MG PO TABS: 1.0000 | ORAL_TABLET | Freq: Once | ORAL | Status: DC | PRN

## 2022-12-06 MED ORDER — KETOROLAC TROMETHAMINE 30 MG/ML IJ SOLN
INTRAMUSCULAR | Status: AC
  Filled 2022-12-06: qty 1

## 2022-12-06 MED ORDER — POLYETHYLENE GLYCOL 3350 17 G PO PACK
17.0000 g | PACK | Freq: Every day | ORAL | Status: DC
  Administered 2022-12-07: 17 g via ORAL
  Filled 2022-12-06: qty 1

## 2022-12-06 MED ORDER — FUROSEMIDE 20 MG PO TABS
10.0000 mg | ORAL_TABLET | Freq: Every day | ORAL | Status: DC
  Administered 2022-12-06 – 2022-12-07 (×2): 10 mg via ORAL
  Filled 2022-12-06 (×2): qty 1

## 2022-12-06 MED ORDER — LIDOCAINE HCL (PF) 2 % IJ SOLN
INTRAMUSCULAR | Status: AC
  Filled 2022-12-06: qty 5

## 2022-12-06 MED ORDER — POLYVINYL ALCOHOL 1.4 % OP SOLN: Freq: Every day | OPHTHALMIC | Status: DC | PRN

## 2022-12-06 MED ORDER — PRAVASTATIN SODIUM 20 MG PO TABS: 40.0000 mg | ORAL_TABLET | Freq: Every day | ORAL | Status: DC

## 2022-12-06 MED ORDER — OXYCODONE HCL 5 MG PO TABS
5.0000 mg | ORAL_TABLET | Freq: Once | ORAL | Status: AC | PRN
  Administered 2022-12-06: 5 mg via ORAL

## 2022-12-06 MED ORDER — 0.9 % SODIUM CHLORIDE (POUR BTL) OPTIME
TOPICAL | Status: DC | PRN
  Administered 2022-12-06: 1000 mL

## 2022-12-06 MED ORDER — GABAPENTIN 300 MG PO CAPS
600.0000 mg | ORAL_CAPSULE | Freq: Every day | ORAL | Status: DC
  Administered 2022-12-06: 600 mg via ORAL
  Filled 2022-12-06: qty 2

## 2022-12-06 MED ORDER — ACETAMINOPHEN 325 MG PO TABS: 650.0000 mg | ORAL_TABLET | Freq: Four times a day (QID) | ORAL | Status: DC | PRN

## 2022-12-06 MED ORDER — FAMOTIDINE 20 MG PO TABS
ORAL_TABLET | ORAL | Status: AC
  Administered 2022-12-06: 20 mg via ORAL
  Filled 2022-12-06: qty 1

## 2022-12-06 MED ORDER — BUPIVACAINE HCL (PF) 0.5 % IJ SOLN
INTRAMUSCULAR | Status: AC
  Filled 2022-12-06: qty 30

## 2022-12-06 MED ORDER — FENTANYL CITRATE (PF) 100 MCG/2ML IJ SOLN
INTRAMUSCULAR | Status: DC | PRN
  Administered 2022-12-06 (×6): 25 ug via INTRAVENOUS

## 2022-12-06 MED ORDER — METOPROLOL TARTRATE 50 MG PO TABS
75.0000 mg | ORAL_TABLET | Freq: Two times a day (BID) | ORAL | Status: DC
  Administered 2022-12-06 – 2022-12-07 (×2): 75 mg via ORAL
  Filled 2022-12-06 (×2): qty 2

## 2022-12-06 MED ORDER — OXYCODONE HCL 5 MG PO TABS
5.0000 mg | ORAL_TABLET | ORAL | Status: DC | PRN
  Administered 2022-12-07: 5 mg via ORAL
  Filled 2022-12-06: qty 1

## 2022-12-06 MED ORDER — BUPIVACAINE HCL (PF) 0.5 % IJ SOLN
INTRAMUSCULAR | Status: DC | PRN
  Administered 2022-12-06: 20 mL via PERINEURAL

## 2022-12-06 MED ORDER — PROPOFOL 10 MG/ML IV BOLUS
INTRAVENOUS | Status: DC | PRN
  Administered 2022-12-06: 100 mg via INTRAVENOUS

## 2022-12-06 MED ORDER — FENTANYL CITRATE (PF) 100 MCG/2ML IJ SOLN
25.0000 ug | INTRAMUSCULAR | Status: DC | PRN
  Administered 2022-12-06 (×3): 25 ug via INTRAVENOUS

## 2022-12-06 MED ORDER — PROPOFOL 10 MG/ML IV BOLUS
INTRAVENOUS | Status: AC
  Filled 2022-12-06: qty 20

## 2022-12-06 MED ORDER — CEFAZOLIN SODIUM-DEXTROSE 1-4 GM/50ML-% IV SOLN
1.0000 g | Freq: Three times a day (TID) | INTRAVENOUS | Status: AC
  Administered 2022-12-06 – 2022-12-07 (×2): 1 g via INTRAVENOUS
  Filled 2022-12-06 (×2): qty 50

## 2022-12-06 MED ORDER — POTASSIUM CHLORIDE CRYS ER 10 MEQ PO TBCR
10.0000 meq | EXTENDED_RELEASE_TABLET | Freq: Every day | ORAL | Status: DC
  Administered 2022-12-07: 10 meq via ORAL
  Filled 2022-12-06: qty 1

## 2022-12-06 MED ORDER — PHENYLEPHRINE HCL (PRESSORS) 10 MG/ML IV SOLN
INTRAVENOUS | Status: DC | PRN
  Administered 2022-12-06: 80 ug via INTRAVENOUS

## 2022-12-06 MED ORDER — ACETAMINOPHEN 160 MG/5ML PO SOLN: 325.0000 mg | ORAL | Status: DC | PRN

## 2022-12-06 MED ORDER — PANTOPRAZOLE SODIUM 40 MG PO TBEC
40.0000 mg | DELAYED_RELEASE_TABLET | Freq: Every day | ORAL | Status: DC
  Administered 2022-12-06 – 2022-12-07 (×2): 40 mg via ORAL
  Filled 2022-12-06 (×2): qty 1

## 2022-12-06 MED ORDER — DEXAMETHASONE SODIUM PHOSPHATE 10 MG/ML IJ SOLN
INTRAMUSCULAR | Status: AC
  Filled 2022-12-06: qty 1

## 2022-12-06 MED ORDER — LIDOCAINE HCL (PF) 1 % IJ SOLN
INTRAMUSCULAR | Status: AC
  Filled 2022-12-06: qty 5

## 2022-12-06 MED ORDER — AMIODARONE HCL 200 MG PO TABS
200.0000 mg | ORAL_TABLET | Freq: Every day | ORAL | Status: DC
  Administered 2022-12-06 – 2022-12-07 (×2): 200 mg via ORAL
  Filled 2022-12-06 (×2): qty 1

## 2022-12-06 MED ORDER — ACETAMINOPHEN 500 MG PO TABS
ORAL_TABLET | ORAL | Status: AC
  Administered 2022-12-06: 1000 mg via ORAL
  Filled 2022-12-06: qty 2

## 2022-12-06 MED ORDER — PHENYLEPHRINE 80 MCG/ML (10ML) SYRINGE FOR IV PUSH (FOR BLOOD PRESSURE SUPPORT)
PREFILLED_SYRINGE | INTRAVENOUS | Status: AC
  Filled 2022-12-06: qty 10

## 2022-12-06 MED ORDER — OXYCODONE HCL 5 MG PO TABS: 5.0000 mg | ORAL_TABLET | ORAL | 0 refills | Status: DC | PRN

## 2022-12-06 MED ORDER — FENTANYL CITRATE (PF) 100 MCG/2ML IJ SOLN
INTRAMUSCULAR | Status: AC
  Filled 2022-12-06: qty 2

## 2022-12-06 MED ORDER — ONDANSETRON HCL 4 MG/2ML IJ SOLN
INTRAMUSCULAR | Status: AC
  Filled 2022-12-06: qty 2

## 2022-12-06 MED ORDER — LEVOTHYROXINE SODIUM 50 MCG PO TABS
125.0000 ug | ORAL_TABLET | Freq: Every day | ORAL | Status: DC
  Administered 2022-12-07: 125 ug via ORAL
  Filled 2022-12-06 (×2): qty 1

## 2022-12-06 MED ORDER — APIXABAN 5 MG PO TABS
5.0000 mg | ORAL_TABLET | Freq: Two times a day (BID) | ORAL | Status: DC
  Administered 2022-12-07: 5 mg via ORAL
  Filled 2022-12-06: qty 1

## 2022-12-06 SURGICAL SUPPLY — 70 items
BIT DRILL 2.2 SS TIBIAL (BIT) IMPLANT
BNDG CMPR 5X4 CHSV STRCH STRL (GAUZE/BANDAGES/DRESSINGS) ×1
BNDG CMPR STD VLCR NS LF 5.8X3 (GAUZE/BANDAGES/DRESSINGS)
BNDG CMPR STD VLCR NS LF 5.8X4 (GAUZE/BANDAGES/DRESSINGS)
BNDG COHESIVE 4X5 TAN STRL LF (GAUZE/BANDAGES/DRESSINGS) ×1 IMPLANT
BNDG ELASTIC 3X5.8 VLCR NS LF (GAUZE/BANDAGES/DRESSINGS) ×2 IMPLANT
BNDG ELASTIC 4X5.8 VLCR NS LF (GAUZE/BANDAGES/DRESSINGS) ×1 IMPLANT
BNDG ELASTIC 4X5.8 VLCR STR LF (GAUZE/BANDAGES/DRESSINGS) ×2 IMPLANT
BNDG ESMARCH 4 X 12 STRL LF (GAUZE/BANDAGES/DRESSINGS) ×1
BNDG ESMARCH 4X12 STRL LF (GAUZE/BANDAGES/DRESSINGS) ×1 IMPLANT
BNDG PLASTER FAST 3X3 WHT LF (CAST SUPPLIES) ×2 IMPLANT
BNDG PLSTR 3X3 XFST ST WHT LF (CAST SUPPLIES) ×2
CORD BIP STRL DISP 12FT (MISCELLANEOUS) ×1 IMPLANT
CUFF TOURN SGL QUICK 18X4 (TOURNIQUET CUFF) IMPLANT
DRAPE FLUOR MINI C-ARM 54X84 (DRAPES) ×1 IMPLANT
DRAPE SURG 17X11 SM STRL (DRAPES) ×1 IMPLANT
DRSG GAUZE FLUFF 36X18 (GAUZE/BANDAGES/DRESSINGS) ×1 IMPLANT
DURAPREP 26ML APPLICATOR (WOUND CARE) ×1 IMPLANT
ELECT REM PT RETURN 9FT ADLT (ELECTROSURGICAL) ×1
ELECTRODE REM PT RTRN 9FT ADLT (ELECTROSURGICAL) ×1 IMPLANT
FORCEPS JEWEL BIP 4-3/4 STR (INSTRUMENTS) ×1 IMPLANT
GAUZE SPONGE 4X4 12PLY STRL (GAUZE/BANDAGES/DRESSINGS) ×1 IMPLANT
GAUZE XEROFORM 1X8 LF (GAUZE/BANDAGES/DRESSINGS) ×1 IMPLANT
GLOVE BIO SURGEON STRL SZ7.5 (GLOVE) ×1 IMPLANT
GLOVE BIOGEL PI IND STRL 9 (GLOVE) ×1 IMPLANT
GLOVE BIOGEL PI ORTHO SZ9 (GLOVE) ×2 IMPLANT
GLOVE SURG UNDER LTX SZ7.5 (GLOVE) ×1 IMPLANT
GOWN STRL REUS TWL 2XL XL LVL4 (GOWN DISPOSABLE) ×1 IMPLANT
GOWN STRL REUS W/ TWL LRG LVL3 (GOWN DISPOSABLE) ×1 IMPLANT
GOWN STRL REUS W/TWL LRG LVL3 (GOWN DISPOSABLE) ×1
K-WIRE 1.6 (WIRE) ×3
K-WIRE FX5X1.6XNS BN SS (WIRE) ×3
KIT TURNOVER KIT A (KITS) ×1 IMPLANT
KWIRE FX5X1.6XNS BN SS (WIRE) IMPLANT
MANIFOLD NEPTUNE II (INSTRUMENTS) ×1 IMPLANT
NDL FILTER BLUNT 18X1 1/2 (NEEDLE) ×1 IMPLANT
NEEDLE FILTER BLUNT 18X1 1/2 (NEEDLE) ×1 IMPLANT
NS IRRIG 1000ML POUR BTL (IV SOLUTION) IMPLANT
NS IRRIG 500ML POUR BTL (IV SOLUTION) ×1 IMPLANT
PACK EXTREMITY ARMC (MISCELLANEOUS) ×1 IMPLANT
PAD CAST 4YDX4 CTTN HI CHSV (CAST SUPPLIES) ×2 IMPLANT
PAD PREP 24X41 OB/GYN DISP (PERSONAL CARE ITEMS) ×1 IMPLANT
PADDING CAST COTTON 4X4 STRL (CAST SUPPLIES) ×2
PLATE NARROW DVR RIGHT (Plate) IMPLANT
SCREW LOCK 14X2.7X 3 LD TPR (Screw) IMPLANT
SCREW LOCK 16X2.7X 3 LD TPR (Screw) IMPLANT
SCREW LOCK 20X2.7X 3 LD TPR (Screw) IMPLANT
SCREW LOCKING 2.7X14 (Screw) ×1 IMPLANT
SCREW LOCKING 2.7X16 (Screw) ×1 IMPLANT
SCREW LOCKING 2.7X20MM (Screw) ×2 IMPLANT
SCREW LP NL 2.7X22MM (Screw) IMPLANT
SCREW NLOCK 24X2.7 3 LD (Screw) IMPLANT
SCREW NONLOCK 2.7X24 (Screw) ×2 IMPLANT
SLING ARM M TX990204 (SOFTGOODS) IMPLANT
SPLINT CAST 1 STEP 3X12 (MISCELLANEOUS) IMPLANT
SPLINT CAST 1 STEP 4X30 (MISCELLANEOUS) ×1 IMPLANT
STOCKINETTE 48X4 2 PLY STRL (GAUZE/BANDAGES/DRESSINGS) ×1 IMPLANT
STOCKINETTE STRL 4IN 9604848 (GAUZE/BANDAGES/DRESSINGS) ×1 IMPLANT
STRIP CLOSURE SKIN 1/2X4 (GAUZE/BANDAGES/DRESSINGS) ×1 IMPLANT
SUT ETHILON 4-0 (SUTURE) ×1
SUT ETHILON 4-0 FS2 18XMFL BLK (SUTURE) ×1
SUT MNCRL AB 4-0 PS2 18 (SUTURE) ×1 IMPLANT
SUT VIC AB 0 CT2 27 (SUTURE) ×1 IMPLANT
SUT VIC AB 3-0 SH 27 (SUTURE) ×1
SUT VIC AB 3-0 SH 27X BRD (SUTURE) ×1 IMPLANT
SUTURE ETHLN 4-0 FS2 18XMF BLK (SUTURE) IMPLANT
SYR 10ML LL (SYRINGE) ×1 IMPLANT
TAPE TRANSPORE STRL 2 31045 (GAUZE/BANDAGES/DRESSINGS) ×1 IMPLANT
TRAP FLUID SMOKE EVACUATOR (MISCELLANEOUS) ×1 IMPLANT
WATER STERILE IRR 500ML POUR (IV SOLUTION) ×1 IMPLANT

## 2022-12-06 NOTE — Anesthesia Procedure Notes (Signed)
Anesthesia Regional Block: Axillary brachial plexus block   Pre-Anesthetic Checklist: , timeout performed,  Correct Patient, Correct Site, Correct Laterality,  Correct Procedure, Correct Position, site marked,  Risks and benefits discussed,  Surgical consent,  Pre-op evaluation,  At surgeon's request and post-op pain management  Laterality: Upper and Right  Prep: chloraprep       Needles:  Injection technique: Single-shot  Needle Type: Stimiplex     Needle Length: 9cm  Needle Gauge: 22     Additional Needles:   Procedures:,,,, ultrasound used (permanent image in chart),,    Narrative:  Start time: 12/06/2022 5:30 PM End time: 12/06/2022 5:35 PM Injection made incrementally with aspirations every 5 mL.  Performed by: Personally  Anesthesiologist: Iran Ouch, MD  Additional Notes: Patient consented for risk and benefits of nerve block including but not limited to nerve damage, failed block, bleeding and infection.  Patient voiced understanding.  Functioning IV was confirmed and monitors were applied.  Timeout done prior to procedure and prior to any sedation being given to the patient.  Patient confirmed procedure site prior to any sedation given to the patient. Sterile prep,hand hygiene and sterile gloves were used.  Minimal sedation used for procedure.  No paresthesia endorsed by patient during the procedure.  Negative aspiration and negative test dose prior to incremental administration of local anesthetic. The patient tolerated the procedure well with no immediate complications.

## 2022-12-06 NOTE — Anesthesia Preprocedure Evaluation (Addendum)
Anesthesia Evaluation  Patient identified by MRN, date of birth, ID band Patient awake    Reviewed: Allergy & Precautions, NPO status , Patient's Chart, lab work & pertinent test results  Airway Mallampati: III  TM Distance: >3 FB Neck ROM: full    Dental  (+) Chipped, Caps   Pulmonary sleep apnea , pneumonia, resolved   Pulmonary exam normal        Cardiovascular hypertension, Normal cardiovascular exam+ dysrhythmias + pacemaker   2021Echo IMPRESSIONS     1. Left ventricular ejection fraction, by visual estimation, is 55 to  60%. The left ventricle has normal function. There is moderately increased  left ventricular hypertrophy.   2. Left ventricular diastolic parameters are indeterminate.   3. The left ventricle has no regional wall motion abnormalities.   4. Global right ventricle has normal systolic function.The right  ventricular size is normal. No increase in right ventricular wall  thickness.   5. Left atrial size was mildly dilated.   6. Right atrial size was normal.   7. The mitral valve is normal in structure. Trivial mitral valve  regurgitation. No evidence of mitral stenosis.   8. The tricuspid valve is normal in structure. Tricuspid valve  regurgitation is trivial.   9. The aortic valve is normal in structure. Aortic valve regurgitation is  not visualized. Mild to moderate aortic valve sclerosis/calcification  without any evidence of aortic stenosis.  10. The pulmonic valve was normal in structure. Pulmonic valve  regurgitation is not visualized.  11. TR signal is inadequate for assessing pulmonary artery systolic  pressure.  12. The inferior vena cava is normal in size with greater than 50%  respiratory variability, suggesting right atrial pressure of 3 mmHg.      Neuro/Psych  PSYCHIATRIC DISORDERS Anxiety Depression    negative neurological ROS     GI/Hepatic Neg liver ROS, PUD,GERD  Medicated and  Controlled,,  Endo/Other  Hypothyroidism    Renal/GU      Musculoskeletal   Abdominal   Peds  Hematology  (+) Blood dyscrasia, anemia   Anesthesia Other Findings Past Medical History: No date: Allergy No date: Anxiety No date: Arthritis No date: Atypical chest pain     Comment:  a. 10/2018 MV: small, fixed apical defect possibly 2/2               attenuation artifact. No ischemia.  EF 68%. No date: Clotting disorder ([place]) No date: Colitis No date: Depression 2013: Diverticulitis No date: Dysrhythmia No date: Gastric ulcer No date: GERD (gastroesophageal reflux disease) No date: History of echocardiogram     Comment:  a. 09/2019 Echo: EF 55-60%, mod LVH. Mildly dil LA. Triv               MR/TR. No date: Hypercholesterolemia No date: Hypertension No date: Hypothyroidism 2011: Infiltrating lobular carcinoma of left breast     Comment:  T2,N0, ER: 90%; PR 0%; Her 2 neu not amplified. [facility]). 1997: Melanoma ([place]) 03/19/2011: Melanoma in situ of upper extremity ([place]) No date: Persistent atrial fibrillation ([place])     Comment:  a. CHADS2VASc => 4 (HTN, age x 2, female) 2011: Personal history of radiation therapy     Comment:  BREAST CA No date: Presence of permanent cardiac pacemaker No date: Seroma     Comment:  HISTORY OF LFT BREAST No date: Sleep apnea 1992: Thyroid cancer ([place])  Past  Surgical History: 1973: ABDOMINAL HYSTERECTOMY     Comment:  partial 02-13-13: BREAST BIOPSY; Left     Comment:  BENIGN BREAST TISSUE WITH CHANGES CONSISTENT WITH FAT               NECROSIS 01/21/2015: BREAST BIOPSY; Left     Comment:  bx done in [place] office 11:00 left 6-8cmfn 1995: BREAST EXCISIONAL BIOPSY; Left     Comment:  neg 2011: BREAST EXCISIONAL BIOPSY; Left     Comment:  Breast cancer radiation 2011: BREAST LUMPECTOMY; Left     Comment:  BREAST CA No date: CARDIAC CATHETERIZATION No date: CHOLECYSTECTOMY 2013: COLONOSCOPY 06/09/2021:  COLONOSCOPY WITH PROPOFOL; N/A     Comment:  Procedure: COLONOSCOPY WITH PROPOFOL;  Surgeon: Jonathon Bellows, MD;  Location: [facility] ENDOSCOPY;  Service:               Gastroenterology;  Laterality: N/A; 02/24/2022: HAMMER TOE SURGERY; Bilateral     Comment:  Procedure: HAMMER TOE CORRECTION 2, 3, 4 Right and 2nd               Left;  Surgeon: Edrick Kins, DPM;  Location: [facility] ORS;                Service: Podiatry;  Laterality: Bilateral; No date: MELANOMA EXCISION     Comment:  RT UPPER ARM 01/10/2020: PACEMAKER IMPLANT; N/A     Comment:  Procedure: PACEMAKER IMPLANT;  Surgeon: Constance Haw, MD;  Location: [facility];  Service:               Cardiovascular;  Laterality: N/A; No date: PARTIAL HYSTERECTOMY     Comment:  bleeding, ovaries in place.   1992: THYROID SURGERY     Comment:  FOR THYROID CANCER No date: TONSILLECTOMY     Reproductive/Obstetrics negative OB ROS                             Anesthesia Physical Anesthesia Plan  ASA: 3  Anesthesia Plan: General LMA   Post-op Pain Management:    Induction: Intravenous  PONV Risk Score and Plan: Dexamethasone, Ondansetron, Midazolam and Treatment may vary due to age or medical condition  Airway Management Planned: LMA  Additional Equipment:   Intra-op Plan:   Post-operative Plan: Extubation in OR  Informed Consent: I have reviewed the patients History and Physical, chart, labs and discussed the procedure including the risks, benefits and alternatives for the proposed anesthesia with the patient or authorized representative who has indicated his/her understanding and acceptance.     Dental Advisory Given  Plan Discussed with: Anesthesiologist, CRNA and Surgeon  Anesthesia Plan Comments: (Patient consented for risks of anesthesia including but not limited to:  - adverse reactions to medications - damage to eyes, teeth, lips or other oral mucosa - nerve  damage due to positioning  - sore throat or hoarseness - Damage to heart, brain, nerves, lungs, other parts of body or loss of life  Patient voiced understanding.)       Anesthesia Quick Evaluation

## 2022-12-06 NOTE — Transfer of Care (Signed)
Immediate Anesthesia Transfer of Care Note  Patient: Kerri Mills  Procedure(s) Performed: OPEN REDUCTION INTERNAL FIXATION (ORIF) DISTAL RADIUS FRACTURE (Right: Wrist)  Patient Location: PACU  Anesthesia Type:General  Level of Consciousness: awake, drowsy, and patient cooperative  Airway & Oxygen Therapy: Patient Spontanous Breathing and Patient connected to face mask oxygen  Post-op Assessment: Report given to RN and Post -op Vital signs reviewed and stable  Post vital signs: Reviewed and stable  Last Vitals:  Vitals Value Taken Time  BP 143/73 12/06/22 1640  Temp 36.7 C 12/06/22 1640  Pulse 80 12/06/22 1642  Resp 17 12/06/22 1642  SpO2 97 % 12/06/22 1642  Vitals shown include unvalidated device data.  Last Pain:  Vitals:   12/06/22 1251  TempSrc: Oral  PainSc: 4          Complications: No notable events documented.

## 2022-12-06 NOTE — Telephone Encounter (Signed)
Please advise if ok to refill Metoprolol Tart. Last filled by  [facility] Ordering/Authorizing: Tomma Rakers, MD

## 2022-12-06 NOTE — Op Note (Signed)
12/06/2022  5:40 PM  PATIENT:  Kerri Mills    PRE-OPERATIVE DIAGNOSIS:  Right distal both bone forearm fracture  POST-OPERATIVE DIAGNOSIS:  Same  PROCEDURE:  OPEN REDUCTION INTERNAL FIXATION (ORIF) DISTAL RADIUS FRACTURE  SURGEON:  Thornton Park, MD  ANESTHESIA:   General  PREOPERATIVE INDICATIONS:  Kerri Mills is a  89 y.o. female with a diagnosis of Right Forearm Fracture after a fall.  She underwent a closed reduction in the ER but had recurrence of dorsal angulation of the distal radius.  Therefore I recommended open reduction internal fixation of her right distal radius fracture.  The distal ulna has remained in acceptable alignment.  The risks benefits and alternatives were discussed with the patient preoperatively including but not limited to the risks of infection, bleeding, nerve injury, malunion, nonunion, wrist stiffness, persistent wrist pain, osteoarthritis and the need for further surgery. Medical risks include but are not limited to DVT and pulmonary embolism, myocardial infarction, stroke, pneumonia, respiratory failure and death. Patient and her husband understood these risks and wished to proceed.   OPERATIVE IMPLANTS: [employer] hand innovations plate  OPERATIVE FINDINGS: Displaced right extra-articular distal radius fracture.  OPERATIVE PROCEDURE: Patient was seen in the preoperative area.  She is wearing a sugar-tong splint.  I saw her in the office yesterday and examined her out of the splint.  Her skin was intact.  She was neurovascularly intact.  I marked the right hand with the word yes and my initials according the hospital's correct site of surgery protocol. I answered all questions by the patient and her family. Patient was then brought to the operating room where she was placed supine on the operative table.  The patient underwent general anesthesia with an LMA.   The right arm was prepped and draped in a sterile fashion. A timeout performed to verify the  patient's name, date of birth, medical record number, correct site of surgery correct procedure to be performed. The timeout was also used a timeout to verify patient received antibiotics and appropriate instruments, implants and radiographs studies were available in the room. Once all in attendance were in agreement case began.   Patient then had the operative extremity exsanguinated with an Esmarch. The tourniquet was placed on the right upper extremity and inflated 250 mm for 89 minutes.  A manual reduction of the fracture was performed. The fracture reduction was confirmed on FluoroScan imaging.  A linear incision was then made over the FCR tendon. The subcutaneous tissue was carefully dissected using Metzenbaum scissor and Adson pickup. Retractors were used to protect the radial artery and median nerve. The pronator quadratus was identified and incised and elevated off the volar surface of the distal radius. A  3-hole [employer] volar plate was then positioned on the under surface of the distal radius. It was held into position with 2 K wires. The position of the plate was confirmed on AP and lateral images. Once the plate was in good position a shaft screw was placed bicortically in the oblong hole. This is a 14 mm in length. The attention was then turned to the distal pegs. The proximal row of pegs was placed first. Each individual peg hole was drilled and then measured with a depth gauge. The proximal row had 4 threaded pegs placed. The distal row was then drilled and two threaded locking screws were placed. The position and length of all screws were confirmed on AP and lateral FluoroScan imaging. Care was taken to avoid penetration  of any peg through the articular surface of the distal radius.  Once all distal pegs were placed, the attention was turned back to placement of bicortical shaft screws. Two additional screws were placed in the plate, for a total of 3 bicortical shaft screws. The wound  was then copiously irrigated. Final FluoroScan imaging of the construct were taken. The fracture was in anatomic position and the hardware was well-positioned. The wound again was copiously irrigated. The soft tissue was then carefully over the plate.  The pronator quadratus was repaired.  2-0 Vicryl sutures were used to close the subcutaneous tissue.  The skin was closed with 4-0 nylon in a horizontal mattress fashion.  Steri-Strips, Xeroform and a dry sterile dressing were applied along with an AP splint. I was scrubbed and present for the entire case and all sharp and instrument counts were correct at the conclusion the case. The patient tolerated this procedure well. I spoke with patient's family in the postop consultation room to let them know the case had been performed without complication and the patient was stable in recovery room.   Timoteo Gaul, MD

## 2022-12-06 NOTE — H&P (Signed)
PREOPERATIVE H&P  Chief Complaint: Right Forearm Fracture  HPI: Kerri Mills is a 89 y.o. female who presents for preoperative history and physical with a diagnosis of Right distal both bone forearm Fracture.  Given the displacement of her fractures it has been recommended the patient undergo open reduction internal fixation of the radius and possibly ulna.  Patient and her daughter understood and agreed with the plan for surgery.   Past Medical History:  Diagnosis Date   Allergy    Anxiety    Arthritis    Atypical chest pain    a. 10/2018 MV: small, fixed apical defect possibly 2/2 attenuation artifact. No ischemia.  EF 68%.   Clotting disorder ([place])    Colitis    Depression    Diverticulitis 2013   Dysrhythmia    Gastric ulcer    GERD (gastroesophageal reflux disease)    History of echocardiogram    a. 09/2019 Echo: EF 55-60%, mod LVH. Mildly dil LA. Triv MR/TR.   Hypercholesterolemia    Hypertension    Hypothyroidism    Infiltrating lobular carcinoma of left breast 2011   T2,N0, ER: 90%; PR 0%; Her 2 neu not amplified. [facility]).   Melanoma ([place]) 1997   Melanoma in situ of upper extremity ([place]) 03/19/2011   Persistent atrial fibrillation ([place])    a. CHADS2VASc => 4 (HTN, age x 2, female)   Personal history of radiation therapy 2011   BREAST CA   Presence of permanent cardiac pacemaker    Seroma    HISTORY OF LFT BREAST   Sleep apnea    Thyroid cancer ([place]) 1992   Past Surgical History:  Procedure Laterality Date   ABDOMINAL HYSTERECTOMY  1973   partial   BREAST BIOPSY Left 02-13-13   BENIGN BREAST TISSUE WITH CHANGES CONSISTENT WITH FAT NECROSIS   BREAST BIOPSY Left 01/21/2015   bx done in [facility] office 11:00 left 6-8cmfn   BREAST EXCISIONAL BIOPSY Left 1995   neg   BREAST EXCISIONAL BIOPSY Left 2011   Breast cancer radiation   BREAST LUMPECTOMY Left 2011   BREAST CA   CARDIAC CATHETERIZATION     CHOLECYSTECTOMY     COLONOSCOPY  2013   COLONOSCOPY  WITH PROPOFOL N/A 06/09/2021   Procedure: COLONOSCOPY WITH PROPOFOL;  Surgeon: Jonathon Bellows, MD;  Location: [facility];  Service: Gastroenterology;  Laterality: N/A;   HAMMER TOE SURGERY Bilateral 02/24/2022   Procedure: HAMMER TOE CORRECTION 2, 3, 4 Right and 2nd Left;  Surgeon: Edrick Kins, DPM;  Location: [facility];  Service: Podiatry;  Laterality: Bilateral;   MELANOMA EXCISION     RT UPPER ARM   PACEMAKER IMPLANT N/A 01/10/2020   Procedure: PACEMAKER IMPLANT;  Surgeon: Constance Haw, MD;  Location: [facility];  Service: Cardiovascular;  Laterality: N/A;   PARTIAL HYSTERECTOMY     bleeding, ovaries in place.     THYROID SURGERY  1992   FOR THYROID CANCER   TONSILLECTOMY     Social History   Socioeconomic History   Marital status: Widowed    Spouse name: Not on file   Number of children: Not on file   Years of education: Not on file   Highest education level: Not on file  Occupational History   Not on file  Tobacco Use   Smoking status: Never   Smokeless tobacco: Never   Tobacco comments:    never  Vaping Use   Vaping Use: Never used  Substance and  Sexual Activity   Alcohol use: Yes    Comment: once in a while   Drug use: No   Sexual activity: Never  Other Topics Concern   Not on file  Social History Narrative   Independent and baseline. Lives by herself   Social Determinants of Health   Financial Resource Strain: Low Risk  (02/08/2022)   Overall Financial Resource Strain (CARDIA)    Difficulty of Paying Living Expenses: Not hard at all  Food Insecurity: No Food Insecurity (11/28/2022)   Hunger Vital Sign    Worried About Running Out of Food in the Last Year: Never true    [place] in the Last Year: Never true  Transportation Needs: No Transportation Needs (11/28/2022)   PRAPARE - Hydrologist (Medical): No    Lack of Transportation (Non-Medical): No  Physical Activity: Sufficiently Active (02/08/2022)   Exercise  Vital Sign    Days of Exercise per Week: 4 days    Minutes of Exercise per Session: 40 min  Stress: No Stress Concern Present (02/08/2022)   [place]    Feeling of Stress : Only a little  Social Connections: Unknown (02/08/2022)   Social Connection and Isolation Panel [NHANES]    Frequency of Communication with Friends and Family: More than three times a week    Frequency of Social Gatherings with Friends and Family: More than three times a week    Attends Religious Services: Not on file    Active Member of Clubs or Organizations: Not on file    Attends Archivist Meetings: Not on file    Marital Status: Widowed   Family History  Problem Relation Age of Onset   Heart disease Mother    Cancer Brother        lung    Cancer Sister        breast   Breast cancer Neg Hx    Allergies  Allergen Reactions   Lipitor [Atorvastatin Calcium] Other (See Comments)    Stiffness & soreness   Penicillins Rash   Prior to Admission medications   Medication Sig Start Date End Date Taking? Authorizing Provider  acetaminophen (TYLENOL) 650 MG CR tablet Take 650 mg by mouth every 8 (eight) hours as needed for pain.   Yes [provider]  amiodarone (PACERONE) 200 MG tablet TAKE 1 TABLET BY MOUTH DAILY 10/03/22  Yes Camnitz, Ocie Doyne, MD  Budeson-Glycopyrrol-Formoterol (BREZTRI AEROSPHERE) 160-9-4.8 MCG/ACT AERO Inhale 2 puffs into the lungs in the morning and at bedtime. 11/22/22  Yes Dgayli, Berdine Addison, MD  cephALEXin (KEFLEX) 500 MG capsule Take 1 capsule (500 mg total) by mouth 2 (two) times daily. 11/30/22  Yes Emeterio Reeve, DO  cholecalciferol (VITAMIN D3) 25 MCG (1000 UNIT) tablet Take 2,000 Units by mouth daily.   Yes [provider]  DULoxetine (CYMBALTA) 60 MG capsule TAKE ONE CAPSULE AT BEDTIME 08/15/22  Yes Einar Pheasant, MD  ELIQUIS 5 MG TABS tablet TAKE ONE TABLET BY MOUTH TWICE DAILY 10/10/22   Yes Gollan, Kathlene November, MD  furosemide (LASIX) 20 MG tablet Take 0.5 tablets (10 mg total) by mouth daily. Increase to 1 tablet (20 mg total) by mouth ONCE OR TWICE daily (total daily dose maximum 40 mg) as needed for up to 3 days for increased leg swelling, shortness of breath on exertion, trouble breathing lying flat on your back, weight gain 5+ lbs over 1-2 days. Seek medical  care if these symptoms are not improving with increased dose. 11/30/22  Yes Emeterio Reeve, DO  gabapentin (NEURONTIN) 300 MG capsule TAKE 2 CAPSULES BY MOUTH AT BEDTIME Patient taking differently: Take 600 mg by mouth at bedtime. 06/15/22  Yes Einar Pheasant, MD  levothyroxine (SYNTHROID) 125 MCG tablet Take 1 tablet (125 mcg total) by mouth daily. 11/10/22  Yes Einar Pheasant, MD  lovastatin (MEVACOR) 40 MG tablet TAKE 1 TABLET BY MOUTH DAILY Patient taking differently: Take 40 mg by mouth at bedtime. 03/11/22  Yes Crecencio Mc, MD  metoprolol tartrate 75 MG TABS Take 1 tablet (75 mg total) by mouth 2 (two) times daily. 10/14/22  Yes Hollice Gong, Mir M, MD  Multiple Vitamins-Minerals (PRESERVISION AREDS 2+MULTI VIT PO) Take 1 tablet by mouth 2 (two) times daily.   Yes [provider]  omeprazole (PRILOSEC) 20 MG capsule TAKE 1 CAPSULE BY MOUTH ONCE DAILY 08/17/22  Yes Einar Pheasant, MD  oxyCODONE (OXY IR/ROXICODONE) 5 MG immediate release tablet Take 1-2 tablets (5-10 mg total) by mouth every 6 (six) hours as needed for moderate pain or severe pain. 11/30/22  Yes Emeterio Reeve, DO  Polyethyl Glycol-Propyl Glycol (SYSTANE OP) Place 1 drop into both eyes daily as needed (for dry eyes).   Yes [provider]  potassium chloride (KLOR-CON M) 10 MEQ tablet Take 1 tablet (10 mEq total) by mouth daily. 11/30/22  Yes Emeterio Reeve, DO  albuterol (VENTOLIN HFA) 108 (90 Base) MCG/ACT inhaler Inhale 2 puffs into the lungs every 6 (six) hours as needed for wheezing or shortness of breath. 11/09/22   Einar Pheasant, MD  azelastine (ASTELIN) 0.1 % nasal spray Place 1 spray into both nostrils 2 (two) times daily. Use in each nostril as directed 09/21/21   Einar Pheasant, MD  cetirizine (ZYRTEC) 5 MG tablet Take 1 tablet (5 mg total) by mouth daily. 11/22/22   Armando Reichert, MD  polyethylene glycol powder (GLYCOLAX/MIRALAX) 17 GM/SCOOP powder Take 17 g by mouth daily. 09/10/20   Einar Pheasant, MD  Probiotic Product (ALIGN) 4 MG CAPS One capsule daily while on antibiotic and for two weeks after complete antibiotic 01/12/22   Einar Pheasant, MD  spironolactone (ALDACTONE) 25 MG tablet Take 1 tablet (25 mg total) by mouth daily. Patient not taking: Reported on 12/06/2022 11/16/22   Shirley Friar, PA-C  triamcinolone cream (KENALOG) 0.1 % Apply 1 application. topically 2 (two) times daily. 02/16/22   Einar Pheasant, MD  White Petrolatum-Mineral Oil (GENTEAL TEARS NIGHT-TIME OP) Place 1 application into both eyes at bedtime. Night time ointment 3.5g    [provider]     Positive ROS: All other systems have been reviewed and were otherwise negative with the exception of those mentioned in the HPI and as above.  Physical Exam: General: Alert, no acute distress Cardiovascular: Regular rate and rhythm, no murmurs rubs or gallops.  No pedal edema Respiratory: Clear to auscultation bilaterally, no wheezes rales or rhonchi. No cyanosis, no use of accessory musculature GI: No organomegaly, abdomen is soft and non-tender nondistended with positive bowel sounds. Skin: Skin intact, no lesions within the operative field. Neurologic: Sensation intact distally Psychiatric: Patient is competent for consent with normal mood and affect Lymphatic: No cervical lymphadenopathy  MUSCULOSKELETAL: Patient's skin is intact.  There is no erythema but mild ecchymosis and swelling.  Patient can gently flex and extend all 5 digits of the right hand.  Her fingers well-perfused.  Her wrist range of motion is  limited due  to swelling.  She has intact sensation light touch but her sensation is slightly diminished in her right thumb and middle finger compared to baseline.  Assessment: Right displaced both bone forearm Fracture  Plan: Plan for Procedure(s): OPEN REDUCTION INTERNAL FIXATION (ORIF) of RIGHT DISTAL RADIUS FRACTURE  I reviewed the details of the operation as well as the postoperative course with the patient and her daughter.  I have answered all their questions.  I marked the right upper extremity according to hospital's [place] of surgery protocol.  Preop history and physical was performed at the bedside this morning.  I discussed the risks and benefits of surgery. The risks include but are not limited to infection, bleeding, nerve or blood vessel injury, joint stiffness or loss of motion, persistent pain, weakness or instability, malunion, nonunion and hardware failure and the need for further surgery. Patient and her daughter understood these risks and wished to proceed.     Thornton Park, MD   12/06/2022 2:17 PM

## 2022-12-06 NOTE — Progress Notes (Signed)
  Subjective:  [name]  Patient had significant pain postop and underwent a supraclavicular block by the anesthesia service.  Patient is much more comfortable now.  Is being admitted for neurovascular monitoring and pain control.  [name] was informed of this plan and is in agreement.  Patient was neurovascularly intact in the PACU following surgery prior to her supraclavicular block.  Objective:   VITALS:   Vitals:   12/06/22 1740 12/06/22 1745 12/06/22 1800 12/06/22 1817  BP: 128/64 105/60 (!) 101/59 115/64  Pulse: 83 82 79 79  Resp: (!) 22 15 14 15   Temp:   (!) 97.2 F (36.2 C) 97.8 F (36.6 C)  TempSrc:      SpO2: 99% 92% 97% 94%  Weight:      Height:        PHYSICAL EXAM: Right upper extremity: Patient is elevating her right forearm across her chest.  I helped her take the sling off.  She does not need to wear the sling while in bed.  Patient has no sensation to light touch in her digits after the supraclavicular block.  Her fingers are well-perfused.    LABS  No results found. However, due to the size of the patient record, not all encounters were searched. Please check Results Review for a complete set of results.  DG Wrist 2 Views Right  Result Date: 12/06/2022 CLINICAL DATA:  Follow-up ORIF EXAM: RIGHT WRIST - 2 VIEW COMPARISON:  11/28/2022 FINDINGS: Two views through a plaster splint show plate and screw reduction of the distal radial fracture. Components appear well position with restoration of normal volar tilt of the distal radial articular surface. Good position and alignment of the distal ulnar fracture or cyst. IMPRESSION: Good position and alignment following ORIF of the distal radial fracture. Electronically Signed   By: Nelson Chimes M.D.   On: 12/06/2022 17:48   [place] OR NERVE BLOCK-IMAGE ONLY [facility])  Result Date: 12/06/2022 There is no interpretation for this exam.  This order is for images obtained during  a surgical procedure.  Please See "Surgeries" Tab for more information regarding the procedure.   DG MINI C-ARM IMAGE ONLY  Result Date: 12/06/2022 There is no interpretation for this exam.  This order is for images obtained during a surgical procedure.  Please See "Surgeries" Tab for more information regarding the procedure.    Assessment/Plan: Day of Surgery   Principal Problem:   Distal radius fracture, right  Patient is currently comfortable and stable following ORIF of the right distal radius.  Her splint is in place and remains clean and dry.  Her right hand is well-perfused.  The supraclavicular block is working and she is not having pain but also not having sensation in her fingers currently.  She has no significant odor function of her fingers at this time either which is expected following her block.  Will continue elevating the right lower extremity.  She will wear her sling when she is standing or ambulating.  I have ordered physical and Occupational Therapy consults for the patient tomorrow.  I anticipate discharge home tomorrow as long as the patient's pain is well-controlled.    Thornton Park , MD 12/06/2022, 7:01 PM

## 2022-12-06 NOTE — Anesthesia Procedure Notes (Addendum)
Procedure Name: LMA Insertion Date/Time: 12/06/2022 2:27 PM  Performed by: Levin Erp, CRNAPre-anesthesia Checklist: Patient identified, Emergency Drugs available, Suction available, Patient being monitored and Timeout performed Patient Re-evaluated:Patient Re-evaluated prior to induction Oxygen Delivery Method: Circle system utilized Preoxygenation: Pre-oxygenation with 100% oxygen Induction Type: IV induction Ventilation: Mask ventilation without difficulty LMA: LMA inserted LMA Size: 4.0 Number of attempts: 1 Placement Confirmation: positive ETCO2 and breath sounds checked- equal and bilateral Tube secured with: Tape Dental Injury: Teeth and Oropharynx as per pre-operative assessment

## 2022-12-07 ENCOUNTER — Encounter: Payer: Self-pay | Admitting: Orthopedic Surgery

## 2022-12-07 DIAGNOSIS — S52501A Unspecified fracture of the lower end of right radius, initial encounter for closed fracture: Secondary | ICD-10-CM | POA: Diagnosis not present

## 2022-12-07 NOTE — TOC Progression Note (Addendum)
Transition of Care [facility]) - Progression Note    Patient Details  Name: Kerri Mills MRN: [id number] Date of Birth: 01/17/1931  Transition of Care [facility]) CM/SW [place], RN Phone Number: 12/07/2022, 10:28 AM  Clinical Narrative:     The patient is currently open with [facility] after Last DC home a week ago, Patient received a 3 in 1 and RW last admission a week ago, I notified [facility] the patient will need to resume [place],  She will go home with her daughter  Therapist, music at [place] PCP  Expected Discharge Plan: [place] Barriers to Discharge: No Barriers Identified  Expected Discharge Plan and Services   Discharge Planning Services: CM Consult   Living arrangements for the past 2 months: Single Family Home Expected Discharge Date: 12/06/22                         HH Arranged: PT, OT HH Agency: [place] Date [facility] Agency Contacted: 12/07/22 Time [place]: [place] Representative spoke with at [place]: [place] Determinants of Health ([place]) Interventions SDOH Screenings   Food Insecurity: No Food Insecurity (12/06/2022)  Housing: Low Risk  (12/06/2022)  Transportation Needs: No Transportation Needs (12/06/2022)  Utilities: Not At Risk (12/06/2022)  Depression (PHQ2-9): Low Risk  (10/11/2022)  Financial Resource Strain: Low Risk  (02/08/2022)  Physical Activity: Sufficiently Active (02/08/2022)  Social Connections: Unknown (02/08/2022)  Stress: No Stress Concern Present (02/08/2022)  Tobacco Use: Low Risk  (12/07/2022)    Readmission Risk Interventions     No data to display

## 2022-12-07 NOTE — Plan of Care (Signed)
  Problem: Education: Goal: Knowledge of General Education information will improve Description Including pain rating scale, medication(s)/side effects and non-pharmacologic comfort measures Outcome: Progressing   Problem: Clinical Measurements: Goal: Ability to maintain clinical measurements within normal limits will improve Outcome: Progressing   Problem: Activity: Goal: Risk for activity intolerance will decrease Outcome: Progressing   Problem: Nutrition: Goal: Adequate nutrition will be maintained Outcome: Progressing   Problem: Safety: Goal: Ability to remain free from injury will improve Outcome: Progressing   Problem: Skin Integrity: Goal: Risk for impaired skin integrity will decrease Outcome: Progressing   

## 2022-12-07 NOTE — Progress Notes (Signed)
  Subjective:  POD #1 s/p ORIF right distal radius fracture.   Patient reports right wrist pain is much improved.  Had post-op supraclavicular block in PACU last night.  This helped her severe post-op pain.  Block is wearing off, but pain is still mild.  Patient seen walking out of bathroom with platform walker with daughter's assistance.  She had a bowel movement.  Objective:   VITALS:   Vitals:   12/06/22 1817 12/06/22 2043 12/06/22 2317 12/07/22 0737  BP: 115/64 103/66 112/67 112/70  Pulse: 79 80 81 83  Resp: 15  15 17   Temp: 97.8 F (36.6 C)  97.9 F (36.6 C) 98.2 F (36.8 C)  TempSrc:      SpO2: 94% 99% 95% 94%  Weight:      Height:        PHYSICAL EXAM: Right upper extremity: Patient can flex and extend fingers and rotate forearm and flex and extend right elbow without pain today. Neurovascular intact Sensation intact distally Compartment soft  LABS  No results found. However, due to the size of the patient record, not all encounters were searched. Please check Results Review for a complete set of results.  DG Wrist 2 Views Right  Result Date: 12/06/2022 CLINICAL DATA:  Follow-up ORIF EXAM: RIGHT WRIST - 2 VIEW COMPARISON:  11/28/2022 FINDINGS: Two views through a plaster splint show plate and screw reduction of the distal radial fracture. Components appear well position with restoration of normal volar tilt of the distal radial articular surface. Good position and alignment of the distal ulnar fracture or cyst. IMPRESSION: Good position and alignment following ORIF of the distal radial fracture. Electronically Signed   By: Nelson Chimes M.D.   On: 12/06/2022 17:48   [place] OR NERVE BLOCK-IMAGE ONLY [facility])  Result Date: 12/06/2022 There is no interpretation for this exam.  This order is for images obtained during a surgical procedure.  Please See "Surgeries" Tab for more information regarding the procedure.   DG MINI C-ARM IMAGE ONLY  Result Date: 12/06/2022 There is no  interpretation for this exam.  This order is for images obtained during a surgical procedure.  Please See "Surgeries" Tab for more information regarding the procedure.    Assessment/Plan: 1 Day Post-Op   Principal Problem:   Distal radius fracture, right  Patient is doing well.  Pain controlled.  Daughter picked up prescriptions last night.  Patient will follow up with Dr. Mack Guise on 12-16-2022 @ 11:30 AM.  Continue elevation of NWB of right wrist until follow up.    Thornton Park , MD 12/07/2022, 1:44 PM

## 2022-12-07 NOTE — Progress Notes (Signed)
DISCHARGE NOTE:    Pt discharged with belongings bag and sling put on right upper extremity. Discharge instructions given  to pt and pt's daughter, no further questions or concerns. Pt dressed and wheeled down to medical mall entrance. Transportation provided vis pt's daughter.

## 2022-12-07 NOTE — Evaluation (Signed)
Occupational Therapy Evaluation Patient Details Name: Kerri Mills MRN: [id number] DOB: 04-28-1931 Today's Date: 12/07/2022   History of Present Illness Pt is an 89 year old female s/p ORIF of right distal radius fracture 12/06/22; PMH significant for anxiety, depression, GERD, and HTN.   Clinical Impression   Chart reviewed, pt greeted in bed agreeable to OT evaluation. Pt is known to service from previous admission. Pt is alert and oriented x4. PTA pt lives at [place] however staying with daughter and has assist for ADL/IADL due to injury. Pt presents with deficits in RUE function, activity tolerance, balance affecting safe and optimal ADL completion. Continue to recommend skilled OT to address functional deficits and to facilitate return to PLOF. OT will continue to follow acutely.      Recommendations for follow up therapy are one component of a multi-disciplinary discharge planning process, led by the attending physician.  Recommendations may be updated based on patient status, additional functional criteria and insurance authorization.   Follow Up Recommendations  Home health OT     Assistance Recommended at Discharge Intermittent Supervision/Assistance  Patient can return home with the following A little help with walking and/or transfers;A little help with bathing/dressing/bathroom;Help with stairs or ramp for entrance;Assist for transportation;Assistance with cooking/housework    Functional Status Assessment  Patient has had a recent decline in their functional status and demonstrates the ability to make significant improvements in function in a reasonable and predictable amount of time.  Equipment Recommendations  None recommended by OT;Other (comment) (pt has recommended equipment)    Recommendations for Other Services       Precautions / Restrictions Precautions Precautions: Fall Restrictions Weight Bearing Restrictions: Yes RUE Weight Bearing: Non weight bearing       Mobility Bed Mobility Overal bed mobility: Needs Assistance Bed Mobility: Supine to Sit     Supine to sit: Supervision          Transfers Overall transfer level: Needs assistance Equipment used: 1 person hand held assist, Right platform walker Transfers: Sit to/from Stand, Bed to chair/wheelchair/BSC Sit to Stand: Min guard Stand pivot transfers: Min assist                Balance Overall balance assessment: Needs assistance, History of Falls Sitting-balance support: Feet supported Sitting balance-Leahy Scale: Good     Standing balance support: Bilateral upper extremity supported, Single extremity supported, During functional activity Standing balance-Leahy Scale: Fair                             ADL either performed or assessed with clinical judgement   ADL Overall ADL's : Needs assistance/impaired Eating/Feeding: Set up;Sitting   Grooming: Sitting;Set up           Upper Body Dressing : Moderate assistance;Sitting Upper Body Dressing Details (indicate cue type and reason): simulated, frequent vcs anticipated Lower Body Dressing: Maximal assistance   Toilet Transfer: Minimal assistance Toilet Transfer Details (indicate cue type and reason): simulated to bedside chair with [place] Patient Visual Report: No change from baseline       Perception     Praxis      Pertinent Vitals/Pain Pain Assessment Pain Assessment: No/denies pain     Hand Dominance Right   Extremity/Trunk Assessment Upper Extremity Assessment Upper Extremity Assessment: RUE deficits/detail RUE Deficits / Details: AROM: 1/4 full AROM  elbow, can wiggle fingers slightly; sensation is impaired RUE: Unable to fully assess due to immobilization   Lower Extremity Assessment Lower Extremity Assessment: Overall WFL for tasks assessed;Generalized weakness   Cervical / Trunk Assessment Cervical / Trunk Assessment: Normal   Communication  Communication Communication: No difficulties   Cognition Arousal/Alertness: Awake/alert Behavior During Therapy: WFL for tasks assessed/performed Overall Cognitive Status: Within Functional Limits for tasks assessed                                       General Comments  vitals monitored, appear stable throughout    Exercises Other Exercises Other Exercises: edu re: role of OT, role of rehab, discharge recommendations, home safety, falls prevention, DME use, compensatory strategies for ADLs   Shoulder Instructions      Home Living Family/patient expects to be discharged to:: Private residence (pt lives at [facility], plan to discharge home with daugther initially) Living Arrangements: Children Available Help at Discharge: Family;Available PRN/intermittently Type of Home: Independent living facility Home Access:  (at daughters home- 1 STE without rails)     Home Layout: One level     Bathroom Shower/Tub: Chief Strategy Officer: Rollator (4 wheels);Shower seat;Other (comment);BSC/3in1 (R platform RW)          Prior Functioning/Environment Prior Level of Function : Independent/Modified Independent;Driving;History of Falls (last six months)             Mobility Comments: amb with rollator since 09/2022 ADLs Comments: prior to fall, was MOD I in ADL/IADL, requiring assist for ADL/IADL since previous admission/prior to surgery        OT Problem List: Decreased strength;Decreased coordination;Pain;Decreased activity tolerance;Decreased safety awareness;Impaired balance (sitting and/or standing);Decreased knowledge of use of DME or AE;Decreased range of motion;Impaired UE functional use      OT Treatment/Interventions: Self-care/ADL training;Therapeutic activities;Therapeutic exercise;DME and/or AE instruction;Patient/family education;Balance training    OT Goals(Current goals can be found in the care plan section) Acute Rehab OT  Goals Patient Stated Goal: continue rehab OT Goal Formulation: With patient Time For Goal Achievement: 12/21/22 Potential to Achieve Goals: Good ADL Goals Pt Will Perform Grooming: with modified independence;sitting Pt Will Perform Upper Body Dressing: sitting;with modified independence Pt Will Perform Lower Body Dressing: with modified independence;sitting/lateral leans;sit to/from stand Pt Will Transfer to Toilet: with modified independence Pt Will Perform Toileting - Clothing Manipulation and hygiene: with modified independence;sit to/from stand  OT Frequency: Min 2X/week    Co-evaluation              AM-PAC OT "6 Clicks" Daily Activity     Outcome Measure Help from another person eating meals?: None Help from another person taking care of personal grooming?: A Little Help from another person toileting, which includes using toliet, bedpan, or urinal?: A Lot Help from another person bathing (including washing, rinsing, drying)?: A Little Help from another person to put on and taking off regular upper body clothing?: A Little Help from another person to put on and taking off regular lower body clothing?: A Lot 6 Click Score: 17   End of Session Equipment Utilized During Treatment: Other (comment) (R platform walker) Nurse Communication: Mobility status  Activity Tolerance: Patient tolerated treatment well Patient left: in chair;with call bell/phone within reach;with chair alarm set  OT Visit Diagnosis: Other abnormalities of gait and mobility (R26.89)  [id number] OT Time Calculation (min): 25 min Charges:  OT General Charges $OT Visit: 1 Visit OT Evaluation $OT Eval Low Complexity: 1 Low  Shanon Payor, OTD OTR/L  12/07/22, 10:39 AM

## 2022-12-07 NOTE — Anesthesia Postprocedure Evaluation (Signed)
Anesthesia Post Note  Patient: Kerri Mills  Procedure(s) Performed: OPEN REDUCTION INTERNAL FIXATION (ORIF) DISTAL RADIUS FRACTURE (Right: Wrist)  Patient location during evaluation: PACU Anesthesia Type: General Level of consciousness: awake and alert Pain management: pain level controlled Vital Signs Assessment: post-procedure vital signs reviewed and stable Respiratory status: spontaneous breathing, nonlabored ventilation and respiratory function stable Cardiovascular status: blood pressure returned to baseline and stable Postop Assessment: no apparent nausea or vomiting Anesthetic complications: no   No notable events documented.   Last Vitals:  Vitals:   12/06/22 2043 12/06/22 2317  BP: 103/66 112/67  Pulse: 80 81  Resp:  15  Temp:  36.6 C  SpO2: 99% 95%    Last Pain:  Vitals:   12/06/22 2043  TempSrc:   PainSc: 0-No pain                 Iran Ouch

## 2022-12-07 NOTE — Evaluation (Signed)
Physical Therapy Evaluation Patient Details Name: Kerri Mills MRN: [id number] DOB: 1931/02/23 Today's Date: 12/07/2022  History of Present Illness  Pt is an 89 year old female s/p ORIF of right distal radius fracture 12/06/22; PMH significant for anxiety, depression, GERD, and HTN.  Clinical Impression  Patient received in recliner. Daughter in room. Patient is agreeable to PT. Wants to go home today. She is able to stand from recliner and BSC with min guard. Cues for arm placement on platform and NWB status. She ambulated 200 feet with platform RW and min guard. No lob. Patient is doing well and can return home when medically cleared. Daughter will be staying with patient. She will continue to benefit from skilled PT to improve functional independence and safety with mobility.         Recommendations for follow up therapy are one component of a multi-disciplinary discharge planning process, led by the attending physician.  Recommendations may be updated based on patient status, additional functional criteria and insurance authorization.  Follow Up Recommendations Home health PT      Assistance Recommended at Discharge Frequent or constant Supervision/Assistance  Patient can return home with the following  A little help with walking and/or transfers;A little help with bathing/dressing/bathroom;Help with stairs or ramp for entrance;Assist for transportation    Equipment Recommendations None recommended by PT  Recommendations for Other Services       Functional Status Assessment Patient has had a recent decline in their functional status and demonstrates the ability to make significant improvements in function in a reasonable and predictable amount of time.     Precautions / Restrictions Precautions Precautions: Fall Restrictions Weight Bearing Restrictions: Yes RUE Weight Bearing: Non weight bearing Other Position/Activity Restrictions: use of platform RW      Mobility  Bed  Mobility               General bed mobility comments: NT, received/left in recliner    Transfers Overall transfer level: Needs assistance Equipment used: Right platform walker Transfers: Sit to/from Stand Sit to Stand: Min guard                Ambulation/Gait Ambulation/Gait assistance: Min guard Gait Distance (Feet): 200 Feet Assistive device: Right platform walker Gait Pattern/deviations: Step-through pattern Gait velocity: slightly decreased     General Gait Details: generally safe with PFRW  Stairs            Wheelchair Mobility    Modified Rankin (Stroke Patients Only)       Balance Overall balance assessment: Needs assistance, History of Falls Sitting-balance support: Feet supported Sitting balance-Leahy Scale: Good     Standing balance support: Bilateral upper extremity supported, During functional activity, Reliant on assistive device for balance Standing balance-Leahy Scale: Good                               Pertinent Vitals/Pain Pain Assessment Pain Assessment: Faces Faces Pain Scale: Hurts a little bit Pain Location: R UE Pain Descriptors / Indicators: Grimacing, Dull, Discomfort Pain Intervention(s): Monitored during session    Home Living Family/patient expects to be discharged to:: Private residence Living Arrangements: Children Available Help at Discharge: Family;Available 24 hours/day Type of Home: Independent living facility Home Access:  (at daughters home- 1 STE without rails)       Home Layout: One level Home Equipment: Rollator (4 wheels);Shower seat;Other (comment);BSC/3in1;Rolling Walker (2 wheels) Additional Comments: pt will be staying  with daughter at discharge. Pt from [place]. Per patient, she plans to stay on main level with access to bathroom. 1 step to enter without railing. Daughter plans to be available 24/7    Prior Function Prior Level of Function : Independent/Modified  Independent;Driving;History of Falls (last six months)             Mobility Comments: amb with rollator since 09/2022 ADLs Comments: prior to fall, was MOD I in ADL/IADL, requiring assist for ADL/IADL since previous admission/prior to surgery     Hand Dominance   Dominant Hand: Right    Extremity/Trunk Assessment   Upper Extremity Assessment Upper Extremity Assessment: Defer to OT evaluation RUE Deficits / Details: AROM: 1/4 full AROM elbow, can wiggle fingers slightly; sensation is impaired RUE: Unable to fully assess due to immobilization    Lower Extremity Assessment Lower Extremity Assessment: Overall WFL for tasks assessed    Cervical / Trunk Assessment Cervical / Trunk Assessment: Normal  Communication   Communication: No difficulties  Cognition Arousal/Alertness: Awake/alert Behavior During Therapy: WFL for tasks assessed/performed Overall Cognitive Status: Within Functional Limits for tasks assessed                                          General Comments General comments (skin integrity, edema, etc.): vitals monitored, appear stable throughout    Exercises     Assessment/Plan    PT Assessment Patient needs continued PT services  PT Problem List Decreased strength;Decreased balance;Decreased mobility;Pain;Decreased safety awareness       PT Treatment Interventions Therapeutic activities;Functional mobility training;Gait training;DME instruction;Therapeutic exercise;Patient/family education    PT Goals (Current goals can be found in the Care Plan section)  Acute Rehab PT Goals Patient Stated Goal: to go home PT Goal Formulation: With patient/family Time For Goal Achievement: 12/21/22 Potential to Achieve Goals: Good    Frequency 7X/week     Co-evaluation               AM-PAC PT "6 Clicks" Mobility  Outcome Measure Help needed turning from your back to your side while in a flat bed without using bedrails?: A Little Help  needed moving from lying on your back to sitting on the side of a flat bed without using bedrails?: A Little Help needed moving to and from a bed to a chair (including a wheelchair)?: A Little Help needed standing up from a chair using your arms (e.g., wheelchair or bedside chair)?: A Little Help needed to walk in hospital room?: A Little Help needed climbing 3-5 steps with a railing? : A Little 6 Click Score: 18    End of Session Equipment Utilized During Treatment: Gait belt Activity Tolerance: Patient tolerated treatment well Patient left: in chair;with chair alarm set;with family/visitor present   PT Visit Diagnosis: History of falling (Z91.81);Pain;Other abnormalities of gait and mobility (R26.89) Pain - Right/Left: Right Pain - part of body: Arm    Time: [id number] PT Time Calculation (min) (ACUTE ONLY): 20 min   Charges:   PT Evaluation $PT Eval Moderate Complexity: 1 Mod PT Treatments $Gait Training: 8-22 mins        Joushua Dugar, PT, GCS 12/07/22,12:14 PM

## 2022-12-07 NOTE — Plan of Care (Signed)
  Problem: Clinical Measurements: Goal: Ability to maintain clinical measurements within normal limits will improve Outcome: Progressing Goal: Will remain free from infection Outcome: Progressing   Problem: Activity: Goal: Risk for activity intolerance will decrease Outcome: Progressing   Problem: Pain Managment: Goal: General experience of comfort will improve Outcome: Progressing   Problem: Safety: Goal: Ability to remain free from injury will improve Outcome: Progressing   Problem: Skin Integrity: Goal: Risk for impaired skin integrity will decrease Outcome: Progressing   

## 2022-12-09 NOTE — Discharge Summary (Signed)
Physician Discharge Summary  Patient ID: [name] MRN: [id number] DOB/AGE: 1930/11/28 89 y.o.  Admit date: 12/06/2022 Discharge date: 12/09/2022  Admission Diagnoses:  Right Forearm Fracture Distal radius fracture, right  Discharge Diagnoses:  Right Forearm Fracture Principal Problem:   Distal radius fracture, right   Past Medical History:  Diagnosis Date   Allergy    Anxiety    Arthritis    Atypical chest pain    a. 10/2018 MV: small, fixed apical defect possibly 2/2 attenuation artifact. No ischemia.  EF 68%.   Clotting disorder ([place])    Colitis    Depression    Diverticulitis 2013   Dysrhythmia    Gastric ulcer    GERD (gastroesophageal reflux disease)    History of echocardiogram    a. 09/2019 Echo: EF 55-60%, mod LVH. Mildly dil LA. Triv MR/TR.   Hypercholesterolemia    Hypertension    Hypothyroidism    Infiltrating lobular carcinoma of left breast 2011   T2,N0, ER: 90%; PR 0%; Her 2 neu not amplified. [facility]).   Melanoma ([place]) 1997   Melanoma in situ of upper extremity ([place]) 03/19/2011   Persistent atrial fibrillation ([place])    a. CHADS2VASc => 4 (HTN, age x 2, female)   Personal history of radiation therapy 2011   BREAST CA   Presence of permanent cardiac pacemaker    Seroma    HISTORY OF LFT BREAST   Sleep apnea    Thyroid cancer ([place]) 1992    Surgeries: Procedure(s): OPEN REDUCTION INTERNAL FIXATION (ORIF) DISTAL RADIUS FRACTURE on 12/06/2022   Consultants (if any): Treatment Team:  Thornton Park, MD  Discharged Condition: Improved  Hospital Course: Kerri Mills is an 89 y.o. female who was admitted 12/06/2022 with a diagnosis of  Right Forearm Fracture Distal radius fracture, right and went to the operating room on 12/06/2022 and underwent an uncomplicated ORIF.    She was given perioperative antibiotics:  Anti-infectives (From admission, onward)    Start     Dose/Rate Route Frequency Ordered Stop   12/06/22 2200  ceFAZolin  (ANCEF) IVPB 1 g/50 mL premix        1 g 100 mL/hr over 30 Minutes Intravenous Every 8 hours 12/06/22 1831 12/07/22 0535   12/06/22 1207  ceFAZolin (ANCEF) 2-4 GM/100ML-% IVPB       Note to Pharmacy: Sylvester Harder P: cabinet override      12/06/22 1207 12/06/22 1435   12/06/22 0600  ceFAZolin (ANCEF) IVPB 2g/100 mL premix        2 g 200 mL/hr over 30 Minutes Intravenous On call to O.R. 12/05/22 2238 12/06/22 1502     .  Patient was admitted for post-op pain control and neurovascular monitoring.  Patient did well after a supraclavicular block by anesthesia.  She experienced no post-op complications and was ready for discharge on POD #1.  She benefited maximally from the hospital stay and there were no complications.    Recent vital signs:  Vitals:   12/06/22 2317 12/07/22 0737  BP: 112/67 112/70  Pulse: 81 83  Resp: 15 17  Temp: 97.9 F (36.6 C) 98.2 F (36.8 C)  SpO2: 95% 94%    Recent laboratory studies:  Lab Results  Component Value Date   HGB 13.5 11/30/2022   HGB 12.4 11/29/2022   HGB 14.1 11/28/2022   Lab Results  Component Value Date   WBC 8.8 11/30/2022   PLT 199 11/30/2022   Lab Results  Component Value  Date   INR 1.5 (H) 11/21/2018   Lab Results  Component Value Date   NA 134 (L) 11/30/2022   K 3.7 11/30/2022   CL 97 (L) 11/30/2022   CO2 28 11/30/2022   BUN 13 11/30/2022   CREATININE 0.97 11/30/2022   GLUCOSE 139 (H) 11/30/2022    Discharge Medications:   Allergies as of 12/07/2022       Reactions   Lipitor [atorvastatin Calcium] Other (See Comments)   Stiffness & soreness   Penicillins Rash        Medication List     STOP taking these medications    spironolactone 25 MG tablet Commonly known as: ALDACTONE       TAKE these medications    acetaminophen 650 MG CR tablet Commonly known as: TYLENOL Take 650 mg by mouth every 8 (eight) hours as needed for pain.   albuterol 108 (90 Base) MCG/ACT inhaler Commonly known as: VENTOLIN  HFA Inhale 2 puffs into the lungs every 6 (six) hours as needed for wheezing or shortness of breath.   Align 4 MG Caps One capsule daily while on antibiotic and for two weeks after complete antibiotic   amiodarone 200 MG tablet Commonly known as: PACERONE TAKE 1 TABLET BY MOUTH DAILY   azelastine 0.1 % nasal spray Commonly known as: ASTELIN Place 1 spray into both nostrils 2 (two) times daily. Use in each nostril as directed   Breztri Aerosphere 160-9-4.8 MCG/ACT Aero Generic drug: Budeson-Glycopyrrol-Formoterol Inhale 2 puffs into the lungs in the morning and at bedtime.   cephALEXin 500 MG capsule Commonly known as: KEFLEX Take 1 capsule (500 mg total) by mouth 2 (two) times daily.   cetirizine 5 MG tablet Commonly known as: ZYRTEC Take 1 tablet (5 mg total) by mouth daily.   cholecalciferol 25 MCG (1000 UNIT) tablet Commonly known as: VITAMIN D3 Take 2,000 Units by mouth daily.   DULoxetine 60 MG capsule Commonly known as: CYMBALTA TAKE ONE CAPSULE AT BEDTIME   Eliquis 5 MG Tabs tablet Generic drug: apixaban TAKE ONE TABLET BY MOUTH TWICE DAILY   furosemide 20 MG tablet Commonly known as: LASIX Take 0.5 tablets (10 mg total) by mouth daily. Increase to 1 tablet (20 mg total) by mouth ONCE OR TWICE daily (total daily dose maximum 40 mg) as needed for up to 3 days for increased leg swelling, shortness of breath on exertion, trouble breathing lying flat on your back, weight gain 5+ lbs over 1-2 days. Seek medical care if these symptoms are not improving with increased dose.   gabapentin 300 MG capsule Commonly known as: NEURONTIN TAKE 2 CAPSULES BY MOUTH AT BEDTIME   GENTEAL TEARS NIGHT-TIME OP Place 1 application into both eyes at bedtime. Night time ointment 3.5g   levothyroxine 125 MCG tablet Commonly known as: SYNTHROID Take 1 tablet (125 mcg total) by mouth daily.   lovastatin 40 MG tablet Commonly known as: MEVACOR TAKE 1 TABLET BY MOUTH DAILY What  changed: when to take this   Metoprolol Tartrate 75 MG Tabs Take 1 tablet (75 mg total) by mouth 2 (two) times daily. What changed: Another medication with the same name was added. Make sure you understand how and when to take each.   metoprolol tartrate 50 MG tablet Commonly known as: LOPRESSOR TAKE 1.5 TABLETS BY MOUTH TWICE DAILY. What changed: You were already taking a medication with the same name, and this prescription was added. Make sure you understand how and when to take each.  omeprazole 20 MG capsule Commonly known as: PRILOSEC TAKE 1 CAPSULE BY MOUTH ONCE DAILY   ondansetron 4 MG tablet Commonly known as: Zofran Take 1 tablet (4 mg total) by mouth every 8 (eight) hours as needed for nausea or vomiting.   oxyCODONE 5 MG immediate release tablet Commonly known as: Oxy IR/ROXICODONE Take 1 tablet (5 mg total) by mouth every 4 (four) hours as needed for moderate pain or severe pain. What changed:  how much to take when to take this   polyethylene glycol powder 17 GM/SCOOP powder Commonly known as: GLYCOLAX/MIRALAX Take 17 g by mouth daily.   potassium chloride 10 MEQ tablet Commonly known as: KLOR-CON M Take 1 tablet (10 mEq total) by mouth daily.   PRESERVISION AREDS 2+MULTI VIT PO Take 1 tablet by mouth 2 (two) times daily.   SYSTANE OP Place 1 drop into both eyes daily as needed (for dry eyes).   triamcinolone cream 0.1 % Commonly known as: KENALOG Apply 1 application. topically 2 (two) times daily.        Diagnostic Studies: DG Wrist 2 Views Right  Result Date: 12/06/2022 CLINICAL DATA:  Follow-up ORIF EXAM: RIGHT WRIST - 2 VIEW COMPARISON:  11/28/2022 FINDINGS: Two views through a plaster splint show plate and screw reduction of the distal radial fracture. Components appear well position with restoration of normal volar tilt of the distal radial articular surface. Good position and alignment of the distal ulnar fracture or cyst. IMPRESSION: Good  position and alignment following ORIF of the distal radial fracture. Electronically Signed   By: Nelson Chimes M.D.   On: 12/06/2022 17:48   [place] OR NERVE BLOCK-IMAGE ONLY [facility])  Result Date: 12/06/2022 There is no interpretation for this exam.  This order is for images obtained during a surgical procedure.  Please See "Surgeries" Tab for more information regarding the procedure.   DG MINI C-ARM IMAGE ONLY  Result Date: 12/06/2022 There is no interpretation for this exam.  This order is for images obtained during a surgical procedure.  Please See "Surgeries" Tab for more information regarding the procedure.   CT WRIST RIGHT WO CONTRAST  Result Date: 11/28/2022 CLINICAL DATA:  Fall EXAM: CT OF THE RIGHT WRIST WITHOUT CONTRAST TECHNIQUE: Multidetector CT imaging of the right wrist was performed according to the standard protocol. Multiplanar CT image reconstructions were also generated. RADIATION DOSE REDUCTION: This exam was performed according to the departmental dose-optimization program which includes automated exposure control, adjustment of the mA and/or kV according to patient size and/or use of iterative reconstruction technique. COMPARISON:  Right wrist x-ray 11/28/2022 FINDINGS: Bones/Joint/Cartilage There is an acute distal radius fracture, transverse. There is 1/2 shaft with posterior displacement of the distal fracture fragment with mild apex anterior angulation and 1 cm of overlap. There is an acute oblique fracture of the distal ulna which is in near anatomic alignment with mild apex anterior angulation. There is a nondisplaced ulnar styloid fracture. There is no evidence for dislocation. Ligaments Suboptimally assessed by CT. Muscles and Tendons No focal fluid collection or foreign body identified. Soft tissues There is soft tissue swelling and edema surrounding the fractures. IMPRESSION: 1. Acute displaced and angulated distal radius fracture. 2. Acute oblique fracture of the distal ulna with  mild angulation. 3. Nondisplaced ulnar styloid fracture. Electronically Signed   By: Ronney Asters M.D.   On: 11/28/2022 23:04   DG Wrist 2 Views Right  Result Date: 11/28/2022 CLINICAL DATA:  Distal radial and ulnar fracture reduction EXAM:  RIGHT WRIST - 2 VIEW COMPARISON:  11/28/2022 FINDINGS: Fiberglass splint obscures bony detail. Displaced Colles fracture is again noted with currently about 1.3 cm posterior displacement of the distal radial fragment with respect to the proximal, improved from previous 16 mm and previous overlap. Reduced apex volar angulation. Improved alignment at the distal ulnar metaphysis oblique fracture site. IMPRESSION: 1. Mild improved alignment at the distal radial fracture although there still 1.3 cm posterior displacement of the distal fragment with respect to the proximal. Improved alignment at the distal ulnar fracture site. Electronically Signed   By: Van Clines M.D.   On: 11/28/2022 18:09   CT Head Wo Contrast  Result Date: 11/28/2022 CLINICAL DATA:  Head trauma, minor (Age >= 65y); Neck trauma (Age >= 65y) EXAM: CT HEAD WITHOUT CONTRAST CT CERVICAL SPINE WITHOUT CONTRAST TECHNIQUE: Multidetector CT imaging of the head and cervical spine was performed following the standard protocol without intravenous contrast. Multiplanar CT image reconstructions of the cervical spine were also generated. RADIATION DOSE REDUCTION: This exam was performed according to the departmental dose-optimization program which includes automated exposure control, adjustment of the mA and/or kV according to patient size and/or use of iterative reconstruction technique. COMPARISON:  January 08, 2020. FINDINGS: CT HEAD FINDINGS Brain: No evidence of acute infarction, hemorrhage, hydrocephalus, extra-axial collection or mass lesion/mass effect. Vascular: No hyperdense vessel identified. Skull: No fracture. Sinuses/Orbits: Clear sinuses.  No acute orbital findings. Other: No mastoid effusions. CT  CERVICAL SPINE FINDINGS Alignment: Similar alignment in comparison to the prior. No new sagittal subluxation. Skull base and vertebrae: Vertebral body heights maintained. No evidence of acute fracture. Soft tissues and spinal canal: No prevertebral fluid or swelling. No visible canal hematoma. Disc levels: Similar severe multilevel degenerative change including facet uncovertebral hypertrophy with varying degrees of neural foraminal stenosis. Upper chest: Visualized lung apices are clear. IMPRESSION: No acute intracranial or cervical spine findings. Electronically Signed   By: Margaretha Sheffield M.D.   On: 11/28/2022 16:08   CT Cervical Spine Wo Contrast  Result Date: 11/28/2022 CLINICAL DATA:  Head trauma, minor (Age >= 65y); Neck trauma (Age >= 65y) EXAM: CT HEAD WITHOUT CONTRAST CT CERVICAL SPINE WITHOUT CONTRAST TECHNIQUE: Multidetector CT imaging of the head and cervical spine was performed following the standard protocol without intravenous contrast. Multiplanar CT image reconstructions of the cervical spine were also generated. RADIATION DOSE REDUCTION: This exam was performed according to the departmental dose-optimization program which includes automated exposure control, adjustment of the mA and/or kV according to patient size and/or use of iterative reconstruction technique. COMPARISON:  January 08, 2020. FINDINGS: CT HEAD FINDINGS Brain: No evidence of acute infarction, hemorrhage, hydrocephalus, extra-axial collection or mass lesion/mass effect. Vascular: No hyperdense vessel identified. Skull: No fracture. Sinuses/Orbits: Clear sinuses.  No acute orbital findings. Other: No mastoid effusions. CT CERVICAL SPINE FINDINGS Alignment: Similar alignment in comparison to the prior. No new sagittal subluxation. Skull base and vertebrae: Vertebral body heights maintained. No evidence of acute fracture. Soft tissues and spinal canal: No prevertebral fluid or swelling. No visible canal hematoma. Disc levels:  Similar severe multilevel degenerative change including facet uncovertebral hypertrophy with varying degrees of neural foraminal stenosis. Upper chest: Visualized lung apices are clear. IMPRESSION: No acute intracranial or cervical spine findings. Electronically Signed   By: Margaretha Sheffield M.D.   On: 11/28/2022 16:08   DG Elbow 2 Views Right  Result Date: 11/28/2022 CLINICAL DATA:  Fall, pain EXAM: RIGHT ELBOW - 2 VIEW COMPARISON:  None Available. FINDINGS: Suboptimal  positioning on the lateral view, which limits assessment. Within this limitation, there is no evidence of fracture or dislocation. No large elbow joint effusion. No significant arthropathy. No focal soft tissue abnormality. IMPRESSION: Slightly limited exam without evidence of fracture or dislocation. Electronically Signed   By: Davina Poke D.O.   On: 11/28/2022 16:03   DG Wrist Complete Right  Result Date: 11/28/2022 CLINICAL DATA:  Trauma, fall EXAM: RIGHT WRIST - COMPLETE 3+ VIEW COMPARISON:  05/19/2011 FINDINGS: There is a comminuted fracture in the distal metaphysis and distal end of ulna. Fracture line is involving the base of the ulnar styloid. There is angulation at the fracture site. There is comminuted displaced fracture in the distal radius. There is dorsal displacement of distal fracture fragment along with the carpals in relation to the distal shaft of radius. Degenerative changes are noted in multiple joints, more so in the interphalangeal joint of the right thumb. IMPRESSION: Comminuted, displaced fractures are seen in distal right radius and ulna. Electronically Signed   By: Elmer Picker M.D.   On: 11/28/2022 14:57   CUP PACEART INCLINIC DEVICE CHECK  Result Date: 11/15/2022 Pacemaker check in clinic. Normal device function. Thresholds, sensing, impedances consistent with previous measurements. Device programmed to maximize longevity. No mode switch or high ventricular rates noted. PVC % reported as <1%. Device  programmed at  appropriate safety margins. Histogram distribution appropriate for patient activity level. Estimated longevity 5 yr. Patient enrolled in remote follow-up. Patient education completed.   Disposition: Discharge disposition: 01-Home or Self Care       Discharge Instructions     Call MD / Call 911   Complete by: As directed    If you experience chest pain or shortness of breath, CALL 911 and be transported to the hospital emergency room.  If you develope a fever above 101 F, pus (white drainage) or increased drainage or redness at the wound, or calf pain, call your surgeon's office.   Constipation Prevention   Complete by: As directed    Drink plenty of fluids.  Prune juice may be helpful.  You may use a stool softener, such as Colace (over the counter) 100 mg twice a day.  Use MiraLax (over the counter) for constipation as needed.   Diet general   Complete by: As directed    Discharge instructions   Complete by: As directed    Follow up with Dr. Mack Guise, MD on 12-16-2022 at 11:30 AM  Continue strict elevation of the right forearm.  Patient may apply ice to the right forearm and wrist as desired.  Patient will keep the bandage and splint on until follow-up in the office.  Keep the bandage dry by covering with a plastic bad and elastic for showers.  No weightbearing on the right wrist.  Use a platform walker.   Driving restrictions   Complete by: As directed    No driving until follow up.   Increase activity slowly as tolerated   Complete by: As directed    Lifting restrictions   Complete by: As directed    No lifting for 8-12 weeks   Post-operative opioid taper instructions:   Complete by: As directed    POST-OPERATIVE OPIOID TAPER INSTRUCTIONS: It is important to wean off of your opioid medication as soon as possible. If you do not need pain medication after your surgery it is ok to stop day one. Opioids include: Codeine, Hydrocodone(Norco, Vicodin),  Oxycodone(Percocet, oxycontin) and hydromorphone amongst others.  Long term and  even short term use of opiods can cause: Increased pain response Dependence Constipation Depression Respiratory depression And more.  Withdrawal symptoms can include Flu like symptoms Nausea, vomiting And more Techniques to manage these symptoms Hydrate well Eat regular healthy meals Stay active Use relaxation techniques(deep breathing, meditating, yoga) Do Not substitute Alcohol to help with tapering If you have been on opioids for less than two weeks and do not have pain than it is ok to stop all together.  Plan to wean off of opioids This plan should start within one week post op of your joint replacement. Maintain the same interval or time between taking each dose and first decrease the dose.  Cut the total daily intake of opioids by one tablet each day Next start to increase the time between doses. The last dose that should be eliminated is the evening dose.             Signed: Thornton Park ,MD 12/09/2022, 8:57 AM

## 2022-12-16 ENCOUNTER — Other Ambulatory Visit (HOSPITAL_COMMUNITY): Payer: PPO

## 2022-12-16 DIAGNOSIS — S5291XA Unspecified fracture of right forearm, initial encounter for closed fracture: Secondary | ICD-10-CM | POA: Diagnosis not present

## 2022-12-19 ENCOUNTER — Telehealth: Payer: Self-pay | Admitting: Cardiology

## 2022-12-19 ENCOUNTER — Other Ambulatory Visit: Payer: Self-pay | Admitting: Internal Medicine

## 2022-12-19 ENCOUNTER — Encounter: Payer: Self-pay | Admitting: *Deleted

## 2022-12-19 NOTE — Telephone Encounter (Signed)
Pt c/o medication issue:  1. Name of Medication: amiodarone (PACERONE) 200 MG tablet  Spironolactone 25MG   2. How are you currently taking this medication (dosage and times per day)? As written  3. Are you having a reaction (difficulty breathing--STAT)? No  4. What is your medication issue? Pt's daughter is calling to know if the pt needs to continue taking these medications and if they should get it refilled. Pt's daughter also had questions about the pt's pacemaker. Please advise

## 2022-12-19 NOTE — Telephone Encounter (Signed)
Advised  to continue taking Amiodarone. Informed that Spironolactone was d/c at discharge.  Advised to call office if bp begins to elevate and/or fluid build up occurs. Patient verbalized understanding and agreeable to plan.   Aware will have office call to reschedule echo. She appreciates this help.

## 2022-12-22 ENCOUNTER — Other Ambulatory Visit: Payer: PPO

## 2022-12-26 ENCOUNTER — Encounter: Payer: Self-pay | Admitting: Internal Medicine

## 2022-12-26 ENCOUNTER — Ambulatory Visit (INDEPENDENT_AMBULATORY_CARE_PROVIDER_SITE_OTHER): Payer: PPO | Admitting: Internal Medicine

## 2022-12-26 VITALS — BP 122/70 | HR 83 | Temp 98.0°F | Resp 16 | Ht 65.0 in | Wt 169.6 lb

## 2022-12-26 DIAGNOSIS — K219 Gastro-esophageal reflux disease without esophagitis: Secondary | ICD-10-CM | POA: Diagnosis not present

## 2022-12-26 DIAGNOSIS — I700 Atherosclerosis of aorta: Secondary | ICD-10-CM

## 2022-12-26 DIAGNOSIS — Z85850 Personal history of malignant neoplasm of thyroid: Secondary | ICD-10-CM

## 2022-12-26 DIAGNOSIS — R1310 Dysphagia, unspecified: Secondary | ICD-10-CM | POA: Diagnosis not present

## 2022-12-26 DIAGNOSIS — F419 Anxiety disorder, unspecified: Secondary | ICD-10-CM

## 2022-12-26 DIAGNOSIS — I10 Essential (primary) hypertension: Secondary | ICD-10-CM | POA: Diagnosis not present

## 2022-12-26 DIAGNOSIS — S52501D Unspecified fracture of the lower end of right radius, subsequent encounter for closed fracture with routine healing: Secondary | ICD-10-CM

## 2022-12-26 DIAGNOSIS — J479 Bronchiectasis, uncomplicated: Secondary | ICD-10-CM

## 2022-12-26 DIAGNOSIS — G4733 Obstructive sleep apnea (adult) (pediatric): Secondary | ICD-10-CM

## 2022-12-26 DIAGNOSIS — I4891 Unspecified atrial fibrillation: Secondary | ICD-10-CM

## 2022-12-26 DIAGNOSIS — Z853 Personal history of malignant neoplasm of breast: Secondary | ICD-10-CM

## 2022-12-26 DIAGNOSIS — E039 Hypothyroidism, unspecified: Secondary | ICD-10-CM | POA: Diagnosis not present

## 2022-12-26 DIAGNOSIS — R0602 Shortness of breath: Secondary | ICD-10-CM

## 2022-12-26 DIAGNOSIS — R739 Hyperglycemia, unspecified: Secondary | ICD-10-CM

## 2022-12-26 DIAGNOSIS — D649 Anemia, unspecified: Secondary | ICD-10-CM | POA: Diagnosis not present

## 2022-12-26 DIAGNOSIS — E7800 Pure hypercholesterolemia, unspecified: Secondary | ICD-10-CM

## 2022-12-26 DIAGNOSIS — F439 Reaction to severe stress, unspecified: Secondary | ICD-10-CM

## 2022-12-26 LAB — BASIC METABOLIC PANEL
BUN: 17 mg/dL (ref 6–23)
CO2: 29 mEq/L (ref 19–32)
Calcium: 9.6 mg/dL (ref 8.4–10.5)
Chloride: 103 mEq/L (ref 96–112)
Creatinine, Ser: 1.02 mg/dL (ref 0.40–1.20)
GFR: 48.78 mL/min — ABNORMAL LOW (ref >60.00)
Glucose, Bld: 109 mg/dL — ABNORMAL HIGH (ref 70–99)
Potassium: 4.5 mEq/L (ref 3.5–5.1)
Sodium: 138 mEq/L (ref 135–145)

## 2022-12-26 LAB — TSH: TSH: 1.19 u[IU]/mL (ref 0.35–5.50)

## 2022-12-26 NOTE — Progress Notes (Signed)
Subjective:    Patient ID: Kerri Mills, female    DOB: 04-29-1931, 89 y.o.   MRN: [id number]  Patient here for  Chief Complaint  Patient presents with   Medical Management of Chronic Issues    HPI Here for follow up regarding hypercholesterolemia, afib and hypertension.  She is accompanied by her daughter.  History obtained from both of them. Recently admitted 09/2022 - pneumonia and flu.  Saw cardiology for f/u 11/15/22.  Normal PPM function.  Recommended continuing eliquis and amiodarone.  Recommended f/u ECHO. BNP elevated.  Started on spironolactone and lasix adjusted.  Saw pulmonary 11/22/22 - recommended trial of Breztri.  Also recommended HRCT.  Admitted 11/28/22 - 11/30/22 - wrist fracture s/p mechanical fall. She underwent manipulation and reduction in the emergency room by ED physician.  Continued abx and splinting - inpatient.  Discharged to f/u with ortho.  S/p ORIF distal radius fracture 12/06/22  - Dr Martha Clan.  Has f/u with ortho Wednesday.  Taking tylenol.  Reports breathing is overall better.  Breztri.  Taking lasix - one tablet 2x/week.  Off spironolactone.  Has some issues with eating bread, meat - question - choking sensation.  Discussed swallowing evaluation.  Elects to monitor.  Discussed taking slow bites and chewing food well.  Also eat slowly.  Blood pressures doing well.       Past Medical History:  Diagnosis Date   Allergy    Anxiety    Arthritis    Atypical chest pain    a. 10/2018 MV: small, fixed apical defect possibly 2/2 attenuation artifact. No ischemia.  EF 68%.   Clotting disorder    Colitis    Depression    Diverticulitis 2013   Dysrhythmia    Gastric ulcer    GERD (gastroesophageal reflux disease)    History of echocardiogram    a. 09/2019 Echo: EF 55-60%, mod LVH. Mildly dil LA. Triv MR/TR.   Hypercholesterolemia    Hypertension    Hypothyroidism    Infiltrating lobular carcinoma of left breast 2011   T2,N0, ER: 90%; PR 0%; Her 2 neu not amplified.  [facility]).   Melanoma 1997   Melanoma in situ of upper extremity 03/19/2011   Persistent atrial fibrillation    a. CHADS2VASc => 4 (HTN, age x 2, female)   Personal history of radiation therapy 2011   BREAST CA   Presence of permanent cardiac pacemaker    Seroma    HISTORY OF LFT BREAST   Sleep apnea    Thyroid cancer 1992   Past Surgical History:  Procedure Laterality Date   ABDOMINAL HYSTERECTOMY  1973   partial   BREAST BIOPSY Left 02-13-13   BENIGN BREAST TISSUE WITH CHANGES CONSISTENT WITH FAT NECROSIS   BREAST BIOPSY Left 01/21/2015   bx done in [facility] office 11:00 left 6-8cmfn   BREAST EXCISIONAL BIOPSY Left 1995   neg   BREAST EXCISIONAL BIOPSY Left 2011   Breast cancer radiation   BREAST LUMPECTOMY Left 2011   BREAST CA   CARDIAC CATHETERIZATION     CHOLECYSTECTOMY     COLONOSCOPY  2013   COLONOSCOPY WITH PROPOFOL N/A 06/09/2021   Procedure: COLONOSCOPY WITH PROPOFOL;  Surgeon: Wyline Mood, MD;  Location: [facility] ENDOSCOPY;  Service: Gastroenterology;  Laterality: N/A;   HAMMER TOE SURGERY Bilateral 02/24/2022   Procedure: HAMMER TOE CORRECTION 2, 3, 4 Right and 2nd Left;  Surgeon: Felecia Shelling, DPM;  Location: [facility] ORS;  Service: Podiatry;  Laterality: Bilateral;  MELANOMA EXCISION     RT UPPER ARM   OPEN REDUCTION INTERNAL FIXATION (ORIF) DISTAL RADIAL FRACTURE Right 12/06/2022   Procedure: OPEN REDUCTION INTERNAL FIXATION (ORIF) DISTAL RADIUS FRACTURE;  Surgeon: Juanell Fairly, MD;  Location: [facility] ORS;  Service: Orthopedics;  Laterality: Right;   PACEMAKER IMPLANT N/A 01/10/2020   Procedure: PACEMAKER IMPLANT;  Surgeon: Regan Lemming, MD;  Location: [facility] INVASIVE CV LAB;  Service: Cardiovascular;  Laterality: N/A;   PARTIAL HYSTERECTOMY     bleeding, ovaries in place.     THYROID SURGERY  1992   FOR THYROID CANCER   TONSILLECTOMY     Family History  Problem Relation Age of Onset   Heart disease Mother    Cancer Brother        lung    Cancer  Sister        breast   Breast cancer Neg Hx    Social History   Socioeconomic History   Marital status: Widowed    Spouse name: Not on file   Number of children: Not on file   Years of education: Not on file   Highest education level: Not on file  Occupational History   Not on file  Tobacco Use   Smoking status: Never   Smokeless tobacco: Never   Tobacco comments:    never  Vaping Use   Vaping Use: Never used  Substance and Sexual Activity   Alcohol use: Yes    Comment: once in a while   Drug use: No   Sexual activity: Never  Other Topics Concern   Not on file  Social History Narrative   Independent and baseline. Lives by herself   Social Determinants of Health   Financial Resource Strain: Low Risk  (02/08/2022)   Overall Financial Resource Strain (CARDIA)    Difficulty of Paying Living Expenses: Not hard at all  Food Insecurity: No Food Insecurity (12/06/2022)   Hunger Vital Sign    Worried About Running Out of Food in the Last Year: Never true    Ran Out of Food in the Last Year: Never true  Transportation Needs: No Transportation Needs (12/06/2022)   PRAPARE - Administrator, Civil Service (Medical): No    Lack of Transportation (Non-Medical): No  Physical Activity: Sufficiently Active (02/08/2022)   Exercise Vital Sign    Days of Exercise per Week: 4 days    Minutes of Exercise per Session: 40 min  Stress: No Stress Concern Present (02/08/2022)   [employer] of Occupational Health - Occupational Stress Questionnaire    Feeling of Stress : Only a little  Social Connections: Unknown (02/08/2022)   Social Connection and Isolation Panel [NHANES]    Frequency of Communication with Friends and Family: More than three times a week    Frequency of Social Gatherings with Friends and Family: More than three times a week    Attends Religious Services: Not on file    Active Member of Clubs or Organizations: Not on file    Attends Banker  Meetings: Not on file    Marital Status: Widowed     Review of Systems  Constitutional:  Negative for appetite change and unexpected weight change.  HENT:  Negative for sinus pressure.        No increased congestion.  Overall improved.   Respiratory:  Negative for chest tightness.        Cough and breathing improved.   Cardiovascular:  Negative for chest pain and palpitations.  Gastrointestinal:  Negative for abdominal pain, diarrhea, nausea and vomiting.  Genitourinary:  Negative for difficulty urinating and dysuria.  Musculoskeletal:  Negative for joint swelling and myalgias.  Skin:  Negative for color change and rash.  Neurological:  Negative for dizziness and headaches.  Psychiatric/Behavioral:  Negative for agitation and dysphoric mood.        Objective:     BP 122/70   Pulse 83   Temp 98 F (36.7 C)   Resp 16   Ht 5\' 5"  (1.651 m)   Wt 169 lb 9.6 oz (76.9 kg)   SpO2 96%   BMI 28.22 kg/m  Wt Readings from Last 3 Encounters:  12/26/22 169 lb 9.6 oz (76.9 kg)  12/06/22 165 lb (74.8 kg)  11/28/22 170 lb 12.7 oz (77.5 kg)    Physical Exam Vitals reviewed.  Constitutional:      General: She is not in acute distress.    Appearance: Normal appearance.  HENT:     Head: Normocephalic and atraumatic.     Right Ear: External ear normal.     Left Ear: External ear normal.  Eyes:     General: No scleral icterus.       Right eye: No discharge.        Left eye: No discharge.     Conjunctiva/sclera: Conjunctivae normal.  Neck:     Thyroid: No thyromegaly.  Cardiovascular:     Rate and Rhythm: Normal rate and regular rhythm.  Pulmonary:     Effort: No respiratory distress.     Breath sounds: Normal breath sounds. No wheezing.  Abdominal:     General: Bowel sounds are normal.     Palpations: Abdomen is soft.     Tenderness: There is no abdominal tenderness.  Musculoskeletal:        General: No swelling or tenderness.     Cervical back: Neck supple. No tenderness.   Lymphadenopathy:     Cervical: No cervical adenopathy.  Skin:    Findings: No erythema or rash.  Neurological:     Mental Status: She is alert.  Psychiatric:        Mood and Affect: Mood normal.        Behavior: Behavior normal.      Outpatient Encounter Medications as of 12/26/2022  Medication Sig   acetaminophen (TYLENOL) 650 MG CR tablet Take 650 mg by mouth every 8 (eight) hours as needed for pain.   albuterol (VENTOLIN HFA) 108 (90 Base) MCG/ACT inhaler Inhale 2 puffs into the lungs every 6 (six) hours as needed for wheezing or shortness of breath.   amiodarone (PACERONE) 200 MG tablet TAKE 1 TABLET BY MOUTH DAILY   cetirizine (ZYRTEC) 5 MG tablet Take 1 tablet (5 mg total) by mouth daily.   cholecalciferol (VITAMIN D3) 25 MCG (1000 UNIT) tablet Take 2,000 Units by mouth daily.   DULoxetine (CYMBALTA) 60 MG capsule TAKE ONE CAPSULE AT BEDTIME   ELIQUIS 5 MG TABS tablet TAKE ONE TABLET BY MOUTH TWICE DAILY   furosemide (LASIX) 20 MG tablet Take 0.5 tablets (10 mg total) by mouth daily. Increase to 1 tablet (20 mg total) by mouth ONCE OR TWICE daily (total daily dose maximum 40 mg) as needed for up to 3 days for increased leg swelling, shortness of breath on exertion, trouble breathing lying flat on your back, weight gain 5+ lbs over 1-2 days. Seek medical care if these symptoms are not improving with increased dose.   gabapentin (NEURONTIN) 300 MG  capsule Take 2 capsules (600 mg total) by mouth at bedtime.   levothyroxine (SYNTHROID) 125 MCG tablet Take 1 tablet (125 mcg total) by mouth daily.   lovastatin (MEVACOR) 40 MG tablet TAKE 1 TABLET BY MOUTH DAILY (Patient taking differently: Take 40 mg by mouth at bedtime.)   metoprolol tartrate (LOPRESSOR) 50 MG tablet TAKE 1.5 TABLETS BY MOUTH TWICE DAILY.   Multiple Vitamins-Minerals (PRESERVISION AREDS 2+MULTI VIT PO) Take 1 tablet by mouth 2 (two) times daily.   omeprazole (PRILOSEC) 20 MG capsule TAKE 1 CAPSULE BY MOUTH ONCE DAILY    Polyethyl Glycol-Propyl Glycol (SYSTANE OP) Place 1 drop into both eyes daily as needed (for dry eyes).   polyethylene glycol powder (GLYCOLAX/MIRALAX) 17 GM/SCOOP powder Take 17 g by mouth daily.   potassium chloride (KLOR-CON M) 10 MEQ tablet Take 1 tablet (10 mEq total) by mouth daily.   Probiotic Product (ALIGN) 4 MG CAPS One capsule daily while on antibiotic and for two weeks after complete antibiotic   triamcinolone cream (KENALOG) 0.1 % Apply 1 application. topically 2 (two) times daily.   White Petrolatum-Mineral Oil (GENTEAL TEARS NIGHT-TIME OP) Place 1 application into both eyes at bedtime. Night time ointment 3.5g   [DISCONTINUED] azelastine (ASTELIN) 0.1 % nasal spray Place 1 spray into both nostrils 2 (two) times daily. Use in each nostril as directed   [DISCONTINUED] Budeson-Glycopyrrol-Formoterol (BREZTRI AEROSPHERE) 160-9-4.8 MCG/ACT AERO Inhale 2 puffs into the lungs in the morning and at bedtime.   [DISCONTINUED] cephALEXin (KEFLEX) 500 MG capsule Take 1 capsule (500 mg total) by mouth 2 (two) times daily.   [DISCONTINUED] metoprolol tartrate 75 MG TABS Take 1 tablet (75 mg total) by mouth 2 (two) times daily.   [DISCONTINUED] ondansetron (ZOFRAN) 4 MG tablet Take 1 tablet (4 mg total) by mouth every 8 (eight) hours as needed for nausea or vomiting.   [DISCONTINUED] oxyCODONE (OXY IR/ROXICODONE) 5 MG immediate release tablet Take 1 tablet (5 mg total) by mouth every 4 (four) hours as needed for moderate pain or severe pain.   No facility-administered encounter medications on file as of 12/26/2022.     Lab Results  Component Value Date   WBC 8.8 11/30/2022   HGB 13.5 11/30/2022   HCT 43.1 11/30/2022   PLT 199 11/30/2022   GLUCOSE 109 (H) 12/26/2022   CHOL 177 11/28/2022   TRIG 177.0 (H) 11/28/2022   HDL 42.30 11/28/2022   LDLDIRECT 90.0 03/15/2021   LDLCALC 99 11/28/2022   ALT 21 11/28/2022   AST 23 11/28/2022   NA 138 12/26/2022   K 4.5 12/26/2022   CL 103 12/26/2022    CREATININE 1.02 12/26/2022   BUN 17 12/26/2022   CO2 29 12/26/2022   TSH 1.19 12/26/2022   INR 1.5 (H) 11/21/2018   HGBA1C 6.5 11/28/2022    DG Wrist 2 Views Right  Result Date: 12/06/2022 CLINICAL DATA:  Follow-up ORIF EXAM: RIGHT WRIST - 2 VIEW COMPARISON:  11/28/2022 FINDINGS: Two views through a plaster splint show plate and screw reduction of the distal radial fracture. Components appear well position with restoration of normal volar tilt of the distal radial articular surface. Good position and alignment of the distal ulnar fracture or cyst. IMPRESSION: Good position and alignment following ORIF of the distal radial fracture. Electronically Signed   By: Paulina Fusi M.D.   On: 12/06/2022 17:48   [place] OR NERVE BLOCK-IMAGE ONLY [facility])  Result Date: 12/06/2022 There is no interpretation for this exam.  This order is for  images obtained during a surgical procedure.  Please See "Surgeries" Tab for more information regarding the procedure.   DG MINI C-ARM IMAGE ONLY  Result Date: 12/06/2022 There is no interpretation for this exam.  This order is for images obtained during a surgical procedure.  Please See "Surgeries" Tab for more information regarding the procedure.       Assessment & Plan:  Hypothyroidism, unspecified type Assessment & Plan: On thyroid replacement.  Follow tsh.   Orders: -     TSH  Hypercholesterolemia Assessment & Plan: On lovastatin.  Continue diet and exercise.  Follow fasting profile and liver panel.   Orders: -     Basic metabolic panel  Anemia, unspecified type Assessment & Plan: Follow cbc.    Anxiety Assessment & Plan: Continues on cymbalta. Stable.    Aortic atherosclerosis Assessment & Plan: On lovastatin.    Atrial fibrillation, unspecified type Assessment & Plan: S/p pacemaker placement.  On eliquis and metoprolol.  Continues amiodarone. Continue metoprolol 75mg  bid.  Continue f/u with cardiology.  Currently stable.     Bronchiectasis without complication Assessment & Plan: Saw Dr Aundria Rud - 10/2022.  Trial of Breztri.  Ordered high resolution CT scan.  Breathing is better.  Follow.     Closed fracture of distal end of right radius with routine healing, unspecified fracture morphology, subsequent encounter Assessment & Plan: Has f/u with ortho as outlined.    Dysphagia, unspecified type Assessment & Plan: Recent evaluation. Has seen neurology.  Ordered barium swallow. Neuro - recommended referral to ST. Discussed swallowing evaluation.  She wants to hold at this time.  Discussed small bites.  Chew food well.  Eat slowly.  Keep me posted on how she is doing.    Essential hypertension Assessment & Plan: On metoprolol and lasix as outlined. .   Blood pressure as outlined.  Continue current medication regimen. Hold on making changes.   Follow pressures.  Follow metabolic panel.    Gastroesophageal reflux disease without esophagitis Assessment & Plan: Upper symptoms appear to be controlled on omeprazole.    History of breast cancer Assessment & Plan: 01/10/22 - Birads I.  Evaluated by Dr Lemar Livings 01/20/22 - recommended yearly f/u - Dr Hazle Quant. Due f/u mammogram.    History of thyroid cancer Assessment & Plan: On thyroid replacement.  Follow tsh.    Hyperglycemia Assessment & Plan: Low carb diet and exercise.  Follow met b and a1c.     Obstructive sleep apnea Assessment & Plan: Continue cpap. Discussed using regularly.     SOB (shortness of breath) Assessment & Plan: Pulmonary 10/2022 Markus Daft.  Plan high resolution chest CT. Breathing is overall better.  Follow.     Stress Assessment & Plan: Continue on cymbalta.  Follow       Dale Iowa, MD

## 2022-12-26 NOTE — Patient Instructions (Signed)
Weigh daily °

## 2022-12-27 ENCOUNTER — Ambulatory Visit: Payer: PPO | Admitting: Student

## 2022-12-28 DIAGNOSIS — S5291XA Unspecified fracture of right forearm, initial encounter for closed fracture: Secondary | ICD-10-CM | POA: Diagnosis not present

## 2022-12-29 DIAGNOSIS — I4819 Other persistent atrial fibrillation: Secondary | ICD-10-CM | POA: Diagnosis not present

## 2022-12-29 DIAGNOSIS — E039 Hypothyroidism, unspecified: Secondary | ICD-10-CM | POA: Diagnosis not present

## 2022-12-29 DIAGNOSIS — S52251D Displaced comminuted fracture of shaft of ulna, right arm, subsequent encounter for closed fracture with routine healing: Secondary | ICD-10-CM | POA: Diagnosis not present

## 2022-12-29 DIAGNOSIS — G4733 Obstructive sleep apnea (adult) (pediatric): Secondary | ICD-10-CM | POA: Diagnosis not present

## 2022-12-29 DIAGNOSIS — Z7901 Long term (current) use of anticoagulants: Secondary | ICD-10-CM | POA: Diagnosis not present

## 2022-12-29 DIAGNOSIS — E7800 Pure hypercholesterolemia, unspecified: Secondary | ICD-10-CM | POA: Diagnosis not present

## 2022-12-29 DIAGNOSIS — I495 Sick sinus syndrome: Secondary | ICD-10-CM | POA: Diagnosis not present

## 2022-12-29 DIAGNOSIS — I700 Atherosclerosis of aorta: Secondary | ICD-10-CM | POA: Diagnosis not present

## 2022-12-29 DIAGNOSIS — S52351D Displaced comminuted fracture of shaft of radius, right arm, subsequent encounter for closed fracture with routine healing: Secondary | ICD-10-CM | POA: Diagnosis not present

## 2022-12-29 DIAGNOSIS — J479 Bronchiectasis, uncomplicated: Secondary | ICD-10-CM | POA: Diagnosis not present

## 2023-01-01 ENCOUNTER — Encounter: Payer: Self-pay | Admitting: Internal Medicine

## 2023-01-01 NOTE — Assessment & Plan Note (Signed)
Continue cpap. Discussed using regularly.   

## 2023-01-01 NOTE — Assessment & Plan Note (Signed)
Upper symptoms appear to be controlled on omeprazole.  

## 2023-01-01 NOTE — Assessment & Plan Note (Signed)
On lovastatin 

## 2023-01-01 NOTE — Assessment & Plan Note (Signed)
Recent evaluation. Has seen neurology.  Ordered barium swallow. Neuro - recommended referral to ST. Discussed swallowing evaluation.  She wants to hold at this time.  Discussed small bites.  Chew food well.  Eat slowly.  Keep me posted on how she is doing.

## 2023-01-01 NOTE — Assessment & Plan Note (Signed)
Pulmonary 10/2022 Markus Daft.  Plan high resolution chest CT. Breathing is overall better.  Follow.

## 2023-01-01 NOTE — Assessment & Plan Note (Signed)
On metoprolol and lasix as outlined. .   Blood pressure as outlined.  Continue current medication regimen. Hold on making changes.   Follow pressures.  Follow metabolic panel.

## 2023-01-01 NOTE — Assessment & Plan Note (Signed)
Low carb diet and exercise.  Follow met b and a1c.   

## 2023-01-01 NOTE — Assessment & Plan Note (Signed)
On thyroid replacement.  Follow tsh.  

## 2023-01-01 NOTE — Assessment & Plan Note (Signed)
01/10/22 - Birads I.  Evaluated by Dr Lemar Livings 01/20/22 - recommended yearly f/u - Dr Hazle Quant. Due f/u mammogram.

## 2023-01-01 NOTE — Assessment & Plan Note (Signed)
S/p pacemaker placement.  On eliquis and metoprolol.  Continues amiodarone. Continue metoprolol 75mg  bid.  Continue f/u with cardiology.  Currently stable.

## 2023-01-01 NOTE — Assessment & Plan Note (Signed)
Continue on cymbalta.  Follow  

## 2023-01-01 NOTE — Assessment & Plan Note (Signed)
Follow cbc.  

## 2023-01-01 NOTE — Assessment & Plan Note (Addendum)
Continues on cymbalta. Stable.  

## 2023-01-01 NOTE — Assessment & Plan Note (Signed)
Saw Dr Aundria Rud - 10/2022.  Trial of Breztri.  Ordered high resolution CT scan.  Breathing is better.  Follow.

## 2023-01-01 NOTE — Assessment & Plan Note (Signed)
Has f/u with ortho as outlined.

## 2023-01-01 NOTE — Assessment & Plan Note (Signed)
On lovastatin.  Continue diet and exercise.  Follow fasting profile and liver panel.  ?

## 2023-01-17 ENCOUNTER — Ambulatory Visit (INDEPENDENT_AMBULATORY_CARE_PROVIDER_SITE_OTHER): Payer: PPO

## 2023-01-17 DIAGNOSIS — I495 Sick sinus syndrome: Secondary | ICD-10-CM

## 2023-01-18 LAB — CUP PACEART REMOTE DEVICE CHECK
Battery Remaining Longevity: 58 mo
Battery Remaining Percentage: 70 %
Battery Voltage: 2.98 V
Brady Statistic AP VP Percent: 98 %
Brady Statistic AP VS Percent: 2 %
Brady Statistic AS VP Percent: 1 %
Brady Statistic AS VS Percent: 1 %
Brady Statistic RA Percent Paced: 99 %
Brady Statistic RV Percent Paced: 98 %
Date Time Interrogation Session: 20240423080815
Implantable Lead Connection Status: 753985
Implantable Lead Connection Status: 753985
Implantable Lead Implant Date: 20210416
Implantable Lead Implant Date: 20210416
Implantable Lead Location: 753859
Implantable Lead Location: 753860
Implantable Pulse Generator Implant Date: 20210416
Lead Channel Impedance Value: 360 Ohm
Lead Channel Impedance Value: 530 Ohm
Lead Channel Pacing Threshold Amplitude: 0.75 V
Lead Channel Pacing Threshold Amplitude: 0.75 V
Lead Channel Pacing Threshold Pulse Width: 0.5 ms
Lead Channel Pacing Threshold Pulse Width: 0.5 ms
Lead Channel Sensing Intrinsic Amplitude: 1.3 mV
Lead Channel Sensing Intrinsic Amplitude: 9.2 mV
Lead Channel Setting Pacing Amplitude: 2 V
Lead Channel Setting Pacing Amplitude: 2.5 V
Lead Channel Setting Pacing Pulse Width: 0.5 ms
Lead Channel Setting Sensing Sensitivity: 2 mV
Pulse Gen Model: 2272
Pulse Gen Serial Number: 3813093

## 2023-01-23 ENCOUNTER — Ambulatory Visit: Payer: PPO | Admitting: Student

## 2023-01-24 ENCOUNTER — Telehealth: Payer: Self-pay | Admitting: Internal Medicine

## 2023-01-24 ENCOUNTER — Ambulatory Visit: Payer: PPO | Attending: Student

## 2023-01-24 DIAGNOSIS — R0602 Shortness of breath: Secondary | ICD-10-CM | POA: Diagnosis not present

## 2023-01-24 DIAGNOSIS — I493 Ventricular premature depolarization: Secondary | ICD-10-CM | POA: Diagnosis not present

## 2023-01-24 DIAGNOSIS — I4819 Other persistent atrial fibrillation: Secondary | ICD-10-CM

## 2023-01-24 DIAGNOSIS — R5383 Other fatigue: Secondary | ICD-10-CM

## 2023-01-24 DIAGNOSIS — I495 Sick sinus syndrome: Secondary | ICD-10-CM

## 2023-01-24 DIAGNOSIS — I10 Essential (primary) hypertension: Secondary | ICD-10-CM

## 2023-01-24 DIAGNOSIS — I77810 Thoracic aortic ectasia: Secondary | ICD-10-CM

## 2023-01-24 HISTORY — DX: Thoracic aortic ectasia: I77.810

## 2023-01-24 LAB — ECHOCARDIOGRAM COMPLETE
AR max vel: 3.57 cm2
AV Area VTI: 3.65 cm2
AV Area mean vel: 3.48 cm2
AV Mean grad: 2.5 mmHg
AV Peak grad: 4.5 mmHg
Ao pk vel: 1.07 m/s
Area-P 1/2: 5.38 cm2
Calc EF: 43.5 %
S' Lateral: 3.3 cm
Single Plane A2C EF: 53.3 %
Single Plane A4C EF: 37.4 %

## 2023-01-24 MED ORDER — PERFLUTREN LIPID MICROSPHERE
1.0000 mL | INTRAVENOUS | Status: AC | PRN
  Administered 2023-01-24: 2 mL via INTRAVENOUS

## 2023-01-24 NOTE — Telephone Encounter (Signed)
Contacted Kerri Mills to schedule their annual wellness visit. Appointment made for 01/30/2023.  Thank you,  [facility] Direct dial  [phone]

## 2023-01-25 DIAGNOSIS — I4819 Other persistent atrial fibrillation: Secondary | ICD-10-CM | POA: Diagnosis not present

## 2023-01-25 DIAGNOSIS — E7800 Pure hypercholesterolemia, unspecified: Secondary | ICD-10-CM | POA: Diagnosis not present

## 2023-01-25 DIAGNOSIS — E039 Hypothyroidism, unspecified: Secondary | ICD-10-CM | POA: Diagnosis not present

## 2023-01-25 DIAGNOSIS — J479 Bronchiectasis, uncomplicated: Secondary | ICD-10-CM | POA: Diagnosis not present

## 2023-01-25 DIAGNOSIS — G4733 Obstructive sleep apnea (adult) (pediatric): Secondary | ICD-10-CM | POA: Diagnosis not present

## 2023-01-25 DIAGNOSIS — S52351D Displaced comminuted fracture of shaft of radius, right arm, subsequent encounter for closed fracture with routine healing: Secondary | ICD-10-CM | POA: Diagnosis not present

## 2023-01-25 DIAGNOSIS — S52251D Displaced comminuted fracture of shaft of ulna, right arm, subsequent encounter for closed fracture with routine healing: Secondary | ICD-10-CM | POA: Diagnosis not present

## 2023-01-25 DIAGNOSIS — S5291XA Unspecified fracture of right forearm, initial encounter for closed fracture: Secondary | ICD-10-CM | POA: Diagnosis not present

## 2023-01-25 DIAGNOSIS — I495 Sick sinus syndrome: Secondary | ICD-10-CM | POA: Diagnosis not present

## 2023-01-25 DIAGNOSIS — I700 Atherosclerosis of aorta: Secondary | ICD-10-CM | POA: Diagnosis not present

## 2023-01-25 DIAGNOSIS — Z7901 Long term (current) use of anticoagulants: Secondary | ICD-10-CM | POA: Diagnosis not present

## 2023-01-26 NOTE — Progress Notes (Signed)
Cardiology Office Note Date:  01/27/2023  Patient ID:  Kerri Mills, Kerri Mills November 03, 1930, MRN [id number] PCP:  Dale Lonerock, MD  Cardiologist:  Julien Nordmann, MD Electrophysiologist: Regan Lemming, MD   Chief Complaint: 1 month follow-up SOB  History of Present Illness: Kerri Mills is a 89 y.o. female with PMH notable for tachy-brady s/p PPM, HTN, afib, PVCs, OSA, SOB; seen today for Will Jorja Loa, MD for routine electrophysiology followup.  She saw PA Tillery 11/15/2022 with c/o of gradually worsening SOB. Had recently been diagnosed with PNA/Flu, but SOB preceeded. Is on amio, but SOB preceeds amio as well. Her lower limit had been raised previously to 80 05/2022 to suppress PVCs, so questioned whether increased RV pacing was driving SOB. He planned for updated echo and updated labs to further eval.  She saw pulm who recommended a high res CT to further eval. Overall reassuring during visit. Started a new inhaler Since that time she was hospitalized for a R forearm fracture after a fall s/p ORIF  Today, she tells me that her SOB has continued at same level. Her daughter joins for appt and thinks patient is SOB because she is "rushing" everywhere. Patient does not agree with daughter.  She tried to use the inhaler Pulm MD rx'd, but it made her tremor, so she self-stopped it.   She has lower extremity edema, has been taking lasix 2/week as previously instructed. Has good UOP with lasix.  She has somewhat diminished appetite, abd feels fuller than it used to, but still soft. Able to sleep flat. Mild cough (not new)  Also endorses chest pressure, but no chest pain. Happens mostly with activity, but sometimes at rest.    Device Information: Abbott Dual Chamber PPM implanted 12/2019 for SSS/Tachy-brady   AAD History: Amiodarone  Past Medical History:  Diagnosis Date   Allergy    Anxiety    Arthritis    Atypical chest pain    a. 10/2018 MV: small, fixed apical defect possibly  2/2 attenuation artifact. No ischemia.  EF 68%.   Clotting disorder (HCC)    Colitis    Depression    Diverticulitis 2013   Dysrhythmia    Gastric ulcer    GERD (gastroesophageal reflux disease)    History of echocardiogram    a. 09/2019 Echo: EF 55-60%, mod LVH. Mildly dil LA. Triv MR/TR.   Hypercholesterolemia    Hypertension    Hypothyroidism    Infiltrating lobular carcinoma of left breast 2011   T2,N0, ER: 90%; PR 0%; Her 2 neu not amplified. [facility]   Melanoma (HCC) 1997   Melanoma in situ of upper extremity (HCC) 03/19/2011   Persistent atrial fibrillation (HCC)    a. CHADS2VASc => 4 (HTN, age x 2, female)   Personal history of radiation therapy 2011   BREAST CA   Presence of permanent cardiac pacemaker    Seroma    HISTORY OF LFT BREAST   Sleep apnea    Thyroid cancer (HCC) 1992    Past Surgical History:  Procedure Laterality Date   ABDOMINAL HYSTERECTOMY  1973   partial   BREAST BIOPSY Left 02-13-13   BENIGN BREAST TISSUE WITH CHANGES CONSISTENT WITH FAT NECROSIS   BREAST BIOPSY Left 01/21/2015   bx done in [facility] office 11:00 left 6-8cmfn   BREAST EXCISIONAL BIOPSY Left 1995   neg   BREAST EXCISIONAL BIOPSY Left 2011   Breast cancer radiation   BREAST LUMPECTOMY Left 2011  BREAST CA   CARDIAC CATHETERIZATION     CHOLECYSTECTOMY     COLONOSCOPY  2013   COLONOSCOPY WITH PROPOFOL N/A 06/09/2021   Procedure: COLONOSCOPY WITH PROPOFOL;  Surgeon: Wyline Mood, MD;  Location: [facility] ENDOSCOPY;  Service: Gastroenterology;  Laterality: N/A;   HAMMER TOE SURGERY Bilateral 02/24/2022   Procedure: HAMMER TOE CORRECTION 2, 3, 4 Right and 2nd Left;  Surgeon: Felecia Shelling, DPM;  Location: [facility] ORS;  Service: Podiatry;  Laterality: Bilateral;   MELANOMA EXCISION     RT UPPER ARM   OPEN REDUCTION INTERNAL FIXATION (ORIF) DISTAL RADIAL FRACTURE Right 12/06/2022   Procedure: OPEN REDUCTION INTERNAL FIXATION (ORIF) DISTAL RADIUS FRACTURE;  Surgeon: Juanell Fairly,  MD;  Location: [facility] ORS;  Service: Orthopedics;  Laterality: Right;   PACEMAKER IMPLANT N/A 01/10/2020   Procedure: PACEMAKER IMPLANT;  Surgeon: Regan Lemming, MD;  Location: [facility] INVASIVE CV LAB;  Service: Cardiovascular;  Laterality: N/A;   PARTIAL HYSTERECTOMY     bleeding, ovaries in place.     THYROID SURGERY  1992   FOR THYROID CANCER   TONSILLECTOMY      Current Outpatient Medications  Medication Instructions   acetaminophen (TYLENOL) 650 mg, Oral, Every 8 hours PRN   albuterol (VENTOLIN HFA) 108 (90 Base) MCG/ACT inhaler 2 puffs, Inhalation, Every 6 hours PRN   amiodarone (PACERONE) 200 mg, Oral, Daily   cetirizine (ZYRTEC) 5 mg, Oral, Daily   cholecalciferol (VITAMIN D3) 2,000 Units, Oral, Daily   DULoxetine (CYMBALTA) 60 mg, Oral, Daily at bedtime   Eliquis 5 mg, Oral, 2 times daily   furosemide (LASIX) 20 mg, Oral, Mondays and Fridays   gabapentin (NEURONTIN) 600 mg, Oral, Daily at bedtime   levothyroxine (SYNTHROID) 125 mcg, Oral, Daily   lovastatin (MEVACOR) 40 MG tablet TAKE 1 TABLET BY MOUTH DAILY   metoprolol tartrate (LOPRESSOR) 50 MG tablet TAKE 1.5 TABLETS BY MOUTH TWICE DAILY.   Multiple Vitamins-Minerals (PRESERVISION AREDS 2+MULTI VIT PO) 1 tablet, Oral, 2 times daily   omeprazole (PRILOSEC) 20 mg, Oral, Daily   Polyethyl Glycol-Propyl Glycol (SYSTANE OP) 1 drop, Both Eyes, Daily PRN   polyethylene glycol powder (GLYCOLAX/MIRALAX) 17 g, Oral, Daily   Potassium Chloride CRYS 10 mEq, Does not apply, Mondays and Fridays with furosemide   Probiotic Product (ALIGN) 4 MG CAPS One capsule daily while on antibiotic and for two weeks after complete antibiotic   triamcinolone cream (KENALOG) 0.1 % 1 application , Topical, 2 times daily   White Petrolatum-Mineral Oil (GENTEAL TEARS NIGHT-TIME OP) 1 application , Both Eyes, Daily at bedtime, Night time ointment 3.5g     Social History:  The patient  reports that she has never smoked. She has never used smokeless  tobacco. She reports that she does not currently use alcohol. She reports that she does not use drugs.   Family History:  The patient's family history includes Cancer in her brother and sister; Heart disease in her mother.  ROS:  Please see the history of present illness. All other systems are reviewed and otherwise negative.   PHYSICAL EXAM:  VS:  BP 120/80 (BP Location: Left Arm, Patient Position: Sitting, Cuff Size: Normal)   Pulse 81   Ht 5' 5.5" (1.664 m)   Wt 173 lb (78.5 kg)   SpO2 93%   BMI 28.35 kg/m  BMI: Body mass index is 28.35 kg/m.  GEN- The patient is well appearing, alert and oriented x 3 today.   Lungs- Clear to ausculation bilaterally, normal work  of breathing.  Heart- Regular rate and rhythm, no murmurs, rubs or gallops Extremities- 1+ peripheral edema, warm, dry Skin-   device pocket well-healed, no tethering   Device interrogation done today and reviewed by myself:  Battery 4.6 years No P-waves during sensing R-waves present at 38bpm Threshold and impedence stable  V pacing as steadily increased over last 6 months, now 98%  PVCs > 1% No episodes No changes made today   EKG is ordered. Personal review of EKG from today shows:  AV paced, rate 81bpm; wide QRS  Recent Labs: 11/15/2022: NT-Pro BNP 11,648 11/28/2022: ALT 21 12/26/2022: BUN 17; Creatinine, Ser 1.02; Potassium 4.5; Sodium 138; TSH 1.19 01/27/2023: Hemoglobin 12.8; Platelets 225  11/28/2022: Cholesterol 177; HDL 42.30; LDL Cholesterol 99; Total CHOL/HDL Ratio 4; Triglycerides 177.0; VLDL 35.4   CrCl cannot be calculated (Patient's most recent lab result is older than the maximum 21 days allowed.).   Wt Readings from Last 3 Encounters:  01/27/23 173 lb (78.5 kg)  12/26/22 169 lb 9.6 oz (76.9 kg)  12/06/22 165 lb (74.8 kg)     Additional studies reviewed include: Previous EP, cardiology notes.   TTE, 01/24/2023  1. Left ventricular ejection fraction, by estimation, is 40 to 45%. Left  ventricular ejection fraction by 3D volume is 42 %. The left ventricle has mildly decreased function. The left ventricle demonstrates global hypokinesis. There is moderate left ventricular hypertrophy. Left ventricular diastolic parameters are indeterminate.   2. Right ventricular systolic function is normal. The right ventricular size is normal. There is mildly elevated pulmonary artery systolic pressure. The estimated right ventricular systolic pressure is 42.9 mmHg.   3. The mitral valve is normal in structure. No evidence of mitral valve regurgitation. No evidence of mitral stenosis.   4. Tricuspid valve regurgitation is mild to moderate.   5. The aortic valve is tricuspid. Aortic valve regurgitation is not visualized. Aortic valve sclerosis is present, with no evidence of aortic valve stenosis.   6. There is borderline dilatation of the ascending aorta, measuring 37 mm.   7. The inferior vena cava is normal in size with greater than 50% respiratory variability, suggesting right atrial pressure of 3 mmHg.   Comparison(s): 10/21/19 55-60, LAE.    Long term monitor, 06/20/2022 Patch Wear Time:  7 days and 5 hours  Predominant rhythm was AV paced Less than 1% supraventricular ectopy 22.1% ventricular ectopy No triggered episodes   ASSESSMENT AND PLAN:  #) Tachy-brady s/p PPM Device functioning well, see paceart for details High VP  #) PVC Well-controlled with having raised lower rate limit to 80  #) Afib No burden by device Cont amiodarone 200mg  daily - update CBC and CMP today Previous TSH wnl CHA2DS2-VASc Score = 5 [CHF History: 1, HTN History: 1, Diabetes History: 0, Stroke History: 0, Vascular Disease History: 0, Age Score: 2, Gender Score: 1].  Therefore, the patient's annual risk of stroke is 7.2 % OAC - eilquis 5mg  BID.     #) SOB #) HFmrEF New reduced LVEF compared to prior echo in 2021 In discussion with industry, the increased RV pacing for past 6 months is likely not  the cause of reduced EF Query whether ischemia may be the cause of EF, SOB, and chest pressure sensation patient has - lexiscan to eval If lexiscan abnormal, will proceed with LHC    Current medicines are reviewed at length with the patient today.   The patient does not have concerns regarding her medicines.  The following  changes were made today:  none  Labs/ tests ordered today include:  Orders Placed This Encounter  Procedures   NM Myocar Multi W/Spect W/Wall Motion / EF   CBC   Comp Met (CMET)   B Nat Peptide   EKG 12-Lead     Disposition: Follow up with Dr. Elberta Fortis in  as scheduled     Signed, Sherie Don, NP  01/27/23  12:49 PM  Electrophysiology [facility]

## 2023-01-27 ENCOUNTER — Other Ambulatory Visit: Payer: Self-pay

## 2023-01-27 ENCOUNTER — Telehealth: Payer: Self-pay | Admitting: Cardiovascular Disease

## 2023-01-27 ENCOUNTER — Ambulatory Visit: Payer: PPO | Attending: Cardiology | Admitting: Cardiology

## 2023-01-27 ENCOUNTER — Encounter: Payer: Self-pay | Admitting: Cardiology

## 2023-01-27 ENCOUNTER — Other Ambulatory Visit
Admission: RE | Admit: 2023-01-27 | Discharge: 2023-01-27 | Disposition: A | Discharge status: Home / Self Care | Payer: PPO | Source: Ambulatory Visit | Attending: Cardiology | Admitting: Cardiology

## 2023-01-27 VITALS — BP 120/80 | HR 81 | Ht 65.5 in | Wt 173.0 lb

## 2023-01-27 DIAGNOSIS — I493 Ventricular premature depolarization: Secondary | ICD-10-CM | POA: Diagnosis not present

## 2023-01-27 DIAGNOSIS — R0602 Shortness of breath: Secondary | ICD-10-CM | POA: Diagnosis not present

## 2023-01-27 DIAGNOSIS — I480 Paroxysmal atrial fibrillation: Secondary | ICD-10-CM | POA: Diagnosis not present

## 2023-01-27 DIAGNOSIS — I495 Sick sinus syndrome: Secondary | ICD-10-CM

## 2023-01-27 DIAGNOSIS — I5022 Chronic systolic (congestive) heart failure: Secondary | ICD-10-CM

## 2023-01-27 LAB — CUP PACEART INCLINIC DEVICE CHECK
Battery Remaining Longevity: 55 mo
Battery Voltage: 2.98 V
Brady Statistic RA Percent Paced: 99.56 %
Brady Statistic RV Percent Paced: 98 %
Date Time Interrogation Session: 20240503141418
Implantable Lead Connection Status: 753985
Implantable Lead Connection Status: 753985
Implantable Lead Implant Date: 20210416
Implantable Lead Implant Date: 20210416
Implantable Lead Location: 753859
Implantable Lead Location: 753860
Implantable Pulse Generator Implant Date: 20210416
Lead Channel Impedance Value: 350 Ohm
Lead Channel Impedance Value: 525 Ohm
Lead Channel Pacing Threshold Amplitude: 0.75 V
Lead Channel Pacing Threshold Amplitude: 0.75 V
Lead Channel Pacing Threshold Amplitude: 0.75 V
Lead Channel Pacing Threshold Amplitude: 0.75 V
Lead Channel Pacing Threshold Pulse Width: 0.5 ms
Lead Channel Pacing Threshold Pulse Width: 0.5 ms
Lead Channel Pacing Threshold Pulse Width: 0.5 ms
Lead Channel Pacing Threshold Pulse Width: 0.5 ms
Lead Channel Sensing Intrinsic Amplitude: 1 mV
Lead Channel Sensing Intrinsic Amplitude: 12 mV
Lead Channel Setting Pacing Amplitude: 2 V
Lead Channel Setting Pacing Amplitude: 2.5 V
Lead Channel Setting Pacing Pulse Width: 0.5 ms
Lead Channel Setting Sensing Sensitivity: 2 mV
Pulse Gen Model: 2272
Pulse Gen Serial Number: 3813093

## 2023-01-27 LAB — COMPREHENSIVE METABOLIC PANEL
ALT: 16 U/L (ref 0–44)
AST: 19 U/L (ref 15–41)
Albumin: 3.6 g/dL (ref 3.5–5.0)
Alkaline Phosphatase: 108 U/L (ref 38–126)
Anion gap: 8 (ref 5–15)
BUN: 14 mg/dL (ref 8–23)
CO2: 28 mmol/L (ref 22–32)
Calcium: 9.7 mg/dL (ref 8.9–10.3)
Chloride: 102 mmol/L (ref 98–111)
Creatinine, Ser: 0.85 mg/dL (ref 0.44–1.00)
GFR, Estimated: >60 mL/min (ref >60)
Glucose, Bld: 101 mg/dL — ABNORMAL HIGH (ref 70–99)
Potassium: 4.6 mmol/L (ref 3.5–5.1)
Sodium: 138 mmol/L (ref 135–145)
Total Bilirubin: 0.9 mg/dL (ref 0.3–1.2)
Total Protein: 7 g/dL (ref 6.5–8.1)

## 2023-01-27 LAB — CBC
HCT: 40.8 % (ref 36.0–46.0)
Hemoglobin: 12.8 g/dL (ref 12.0–15.0)
MCH: 31.1 pg (ref 26.0–34.0)
MCHC: 31.4 g/dL (ref 30.0–36.0)
MCV: 99.3 fL (ref 80.0–100.0)
Platelets: 225 10*3/uL (ref 150–400)
RBC: 4.11 MIL/uL (ref 3.87–5.11)
RDW: 12.7 % (ref 11.5–15.5)
WBC: 8.7 10*3/uL (ref 4.0–10.5)
nRBC: 0 % (ref 0.0–0.2)

## 2023-01-27 LAB — BRAIN NATRIURETIC PEPTIDE: B Natriuretic Peptide: 1494.3 pg/mL — ABNORMAL HIGH (ref 0.0–100.0)

## 2023-01-27 MED ORDER — POTASSIUM CHLORIDE CRYS: 10.0000 meq | CRYSTALS | 0 refills | Status: DC

## 2023-01-27 MED ORDER — FUROSEMIDE 20 MG PO TABS: 20.0000 mg | ORAL_TABLET | ORAL | 0 refills | Status: DC

## 2023-01-27 NOTE — Telephone Encounter (Signed)
Call received from [facility] with admitting--Patient was advised to go to the medical mall for labs during recent appointment, but new orders have not been placed. Please call patient to confirm when orders have been placed.

## 2023-01-27 NOTE — Telephone Encounter (Signed)
Spoke with patient's daughter who states the registration desk was able to find the labs so patient did have them drawn before she left.

## 2023-01-27 NOTE — Patient Instructions (Signed)
Medication Instructions:  Your physician recommends that you continue on your current medications as directed. Please refer to the Current Medication list given to you today.  *If you need a refill on your cardiac medications before your next appointment, please call your pharmacy*   Lab Work: CBC, CMP, and BNP - Please go to the [facility]. You will check in at the front desk to the right as you walk into the atrium. Valet Parking is offered if needed. - No appointment needed. You may go any day between 7 am and 6 pm.  If you have labs (blood work) drawn today and your tests are completely normal, you will receive your results only by: MyChart Message (if you have MyChart) OR A paper copy in the mail If you have any lab test that is abnormal or we need to change your treatment, we will call you to review the results.   Testing/Procedures: [facility] MYOVIEW  Your Provider has ordered a Stress Test with nuclear imaging. The purpose of this test is to evaluate the blood supply to your heart muscle. This procedure is referred to as a "Non-Invasive Stress Test." This is because other than having an IV started in your vein, nothing is inserted or "invades" your body. Cardiac stress tests are done to find areas of poor blood flow to the heart by determining the extent of coronary artery disease (CAD). Some patients exercise on a treadmill, which naturally increases the blood flow to your heart, while others who are unable to walk on a treadmill due to physical limitations have a pharmacologic/chemical stress agent called Lexiscan. This medicine will mimic walking on a treadmill by temporarily increasing your coronary blood flow.     REPORT TO [facility] MEDICAL MALL ENTRANCE  **Proceed to the 1st desk on the right, REGISTRATION, to check in**  Please note: this test may take anywhere between 2-4 hours to complete    Instructions regarding medication:   _X_:   You may take all of your regular  morning medications the day of your test unless listed below.    How to prepare for your Myoview test:  Do not eat or drink for 6 hours prior to the test No caffeine for 24 hours prior to the test No smoking 24 hours prior to the test. Ladies, please do not wear dresses.  Skirts or pants are appropriate. Please wear a short sleeve shirt. No perfume, cologne or lotion. Wear comfortable walking shoes. No heels!   PLEASE NOTIFY THE OFFICE AT LEAST 24 HOURS IN ADVANCE IF YOU ARE UNABLE TO KEEP YOUR APPOINTMENT.  [phone] AND  PLEASE NOTIFY NUCLEAR MEDICINE AT [facility] AT LEAST 24 HOURS IN ADVANCE IF YOU ARE UNABLE TO KEEP YOUR APPOINTMENT. [phone]     Follow-Up: To be determined after reviewing test results   At [facility], you and your health needs are our priority.  As part of our continuing mission to provide you with exceptional heart care, we have created designated Provider Care Teams.  These Care Teams include your primary Cardiologist (physician) and Advanced Practice Providers (APPs -  Physician Assistants and Nurse Practitioners) who all work together to provide you with the care you need, when you need it.  We recommend signing up for the patient portal called "MyChart".  Sign up information is provided on this After Visit Summary.  MyChart is used to connect with patients for Virtual Visits (Telemedicine).  Patients are able to view lab/test results, encounter notes, upcoming  appointments, etc.  Non-urgent messages can be sent to your provider as well.   To learn more about what you can do with MyChart, go to [email or web address].

## 2023-01-28 ENCOUNTER — Encounter: Payer: Self-pay | Admitting: Internal Medicine

## 2023-01-28 DIAGNOSIS — I5022 Chronic systolic (congestive) heart failure: Secondary | ICD-10-CM | POA: Insufficient documentation

## 2023-01-30 ENCOUNTER — Ambulatory Visit: Payer: PPO | Admitting: Cardiology

## 2023-01-30 ENCOUNTER — Ambulatory Visit (INDEPENDENT_AMBULATORY_CARE_PROVIDER_SITE_OTHER): Payer: PPO

## 2023-01-30 VITALS — Wt 173.0 lb

## 2023-01-30 DIAGNOSIS — Z0000 Encounter for general adult medical examination without abnormal findings: Secondary | ICD-10-CM | POA: Diagnosis not present

## 2023-01-30 NOTE — Patient Instructions (Signed)
Kerri Mills , Thank you for taking time to come for your Medicare Wellness Visit. I appreciate your ongoing commitment to your health goals. Please review the following plan we discussed and let me know if I can assist you in the future.   These are the goals we discussed:  Goals      Follow up with Primary Care Provider     As needed.        This is a list of the screening recommended for you and due dates:  Health Maintenance  Topic Date Due   Zoster (Shingles) Vaccine (2 of 2) 08/20/2018   COVID-19 Vaccine (6 - 2023-24 season) 09/08/2022   Mammogram  01/11/2023   Flu Shot  04/27/2023   Medicare Annual Wellness Visit  01/30/2024   DTaP/Tdap/Td vaccine (2 - Td or Tdap) 11/15/2026   Pneumonia Vaccine  Completed   DEXA scan (bone density measurement)  Completed   HPV Vaccine  Aged Out    Advanced directives: Advance directive discussed with you today. I have provided a copy for you to complete at home and have notarized. Once this is complete please bring a copy in to our office so we can scan it into your chart.   Conditions/risks identified: How to Increase Your Level of Physical Activity Getting regular physical activity is important for your overall health and well-being. Most people do not get enough exercise. There are easy ways to increase your level of physical activity, even if you have not been very active in the past or if you are just starting out. What are the benefits of physical activity? Physical activity has many short-term and long-term benefits. Being active on a regular basis can improve your physical and mental health as well as provide other benefits. Physical health benefits Helping you lose weight or maintain a healthy weight. Strengthening your muscles and bones. Reducing your risk of certain long-term (chronic) diseases, including heart disease, cancer, and diabetes. Being able to move around more easily and for longer periods of time without getting tired  (increased endurance or stamina). Improving your ability to fight off illness (enhanced immunity). Being able to sleep better. Helping you stay healthy as you get older, including: Helping you stay mobile, or capable of walking and moving around. Preventing accidents, such as falls. Increasing life expectancy. Mental health benefits Boosting your mood and improving your self-esteem. Lowering your chance of having mental health problems, such as depression or anxiety. Helping you feel good about your body. Other benefits Finding new sources of fun and enjoyment. Meeting new people who share a common interest. Before you begin If you have a chronic illness or have not been active for a while, check with your health care provider about how to get started. Ask your health care provider what activities are safe for you. Start out slowly. Walking or doing some simple chair exercises is a good place to start, especially if you have not been active before or for a long time. Set goals that you can work toward. Ask your health care provider how much exercise is best for you. In general, most adults should: Do moderate-intensity exercise for at least 150 minutes each week (30 minutes on most days of the week) or vigorous exercise for at least 75 minutes each week, or a combination of these. Moderate-intensity exercise can include walking at a quick pace, biking, yoga, water aerobics, or gardening. Vigorous exercise involves activities that take more effort, such as jogging or running, playing  sports, swimming laps, or jumping rope. Do strength exercises on at least 2 days each week. This can include weight lifting, body weight exercises, and resistance-band exercises. How to be more physically active Make a plan  Try to find activities that you enjoy. You are more likely to commit to an exercise routine if it does not feel like a chore. If you have bone or joint problems, choose low-impact exercises,  like walking or swimming. Use these tips for being successful with an exercise plan: Find a workout partner for accountability. Join a group or class, such as an aerobics class, cycling class, or sports team. Make family time active. Go for a walk, bike, or swim. Include a variety of exercises each week. Consider using a fitness tracker, such as a mobile phone app or a device worn like a watch, that will count the number of steps you take each day. Many people strive to reach 10,000 steps a day. Find ways to be active in your daily routines Besides your formal exercise plans, you can find ways to do physical activity during your daily routines, such as: Walking or biking to work or to the store. Taking the stairs instead of the elevator. Parking farther away from the door at work or at the store. Planning walking meetings. Walking around while you are on the phone. Where to find more information Centers for Disease Control and Prevention: [employer].pt [employer]'s Council on Fitness, Sports & Nutrition: www.fitness.gov ChooseMyPlate: http://www.harvey.com/ Contact a health care provider if: You have headaches, muscle aches, or joint pain that is concerning. You feel dizzy or light-headed while exercising. You faint. You feel your heart skipping, racing, or fluttering. You have chest pain while exercising. Summary Exercise benefits your mind and body at any age, even if you are just starting out. If you have a chronic illness or have not been active for a while, check with your health care provider before increasing your physical activity. Choose activities that are safe and enjoyable for you. Ask your health care provider what activities are safe for you. Start slowly. Tell your health care provider if you have problems as you start to increase your activity level. This information is not intended to replace advice given to you by your health care provider. Make sure you discuss  any questions you have with your health care provider. Document Revised: 01/08/2021 Document Reviewed: 01/08/2021 Elsevier Patient Education  2023 [employer].   Next appointment: Follow up in one year for your annual wellness visit     Preventive Care 65 Years and Older, Female Preventive care refers to lifestyle choices and visits with your health care provider that can promote health and wellness. What does preventive care include? A yearly physical exam. This is also called an annual well check. Dental exams once or twice a year. Routine eye exams. Ask your health care provider how often you should have your eyes checked. Personal lifestyle choices, including: Daily care of your teeth and gums. Regular physical activity. Eating a healthy diet. Avoiding tobacco and drug use. Limiting alcohol use. Practicing safe sex. Taking low-dose aspirin every day. Taking vitamin and mineral supplements as recommended by your health care provider. What happens during an annual well check? The services and screenings done by your health care provider during your annual well check will depend on your age, overall health, lifestyle risk factors, and family history of disease. Counseling  Your health care provider may ask you questions about your: Alcohol use. Tobacco use.  Drug use. Emotional well-being. Home and relationship well-being. Sexual activity. Eating habits. History of falls. Memory and ability to understand (cognition). Work and work Astronomer. Reproductive health. Screening  You may have the following tests or measurements: Height, weight, and BMI. Blood pressure. Lipid and cholesterol levels. These may be checked every 5 years, or more frequently if you are over 21 years old. Skin check. Lung cancer screening. You may have this screening every year starting at age 63 if you have a 30-pack-year history of smoking and currently smoke or have quit within the past 15  years. Fecal occult blood test (FOBT) of the stool. You may have this test every year starting at age 39. Flexible sigmoidoscopy or colonoscopy. You may have a sigmoidoscopy every 5 years or a colonoscopy every 10 years starting at age 50. Hepatitis C blood test. Hepatitis B blood test. Sexually transmitted disease (STD) testing. Diabetes screening. This is done by checking your blood sugar (glucose) after you have not eaten for a while (fasting). You may have this done every 1-3 years. Bone density scan. This is done to screen for osteoporosis. You may have this done starting at age 83. Mammogram. This may be done every 1-2 years. Talk to your health care provider about how often you should have regular mammograms. Talk with your health care provider about your test results, treatment options, and if necessary, the need for more tests. Vaccines  Your health care provider may recommend certain vaccines, such as: Influenza vaccine. This is recommended every year. Tetanus, diphtheria, and acellular pertussis (Tdap, Td) vaccine. You may need a Td booster every 10 years. Zoster vaccine. You may need this after age 33. Pneumococcal 13-valent conjugate (PCV13) vaccine. One dose is recommended after age 36. Pneumococcal polysaccharide (PPSV23) vaccine. One dose is recommended after age 37. Talk to your health care provider about which screenings and vaccines you need and how often you need them. This information is not intended to replace advice given to you by your health care provider. Make sure you discuss any questions you have with your health care provider. Document Released: 10/09/2015 Document Revised: 06/01/2016 Document Reviewed: 07/14/2015 Elsevier Interactive Patient Education  2017 [employer].  Fall Prevention in the Home Falls can cause injuries. They can happen to people of all ages. There are many things you can do to make your home safe and to help prevent falls. What can I do on  the outside of my home? Regularly fix the edges of walkways and driveways and fix any cracks. Remove anything that might make you trip as you walk through a door, such as a raised step or threshold. Trim any bushes or trees on the path to your home. Use bright outdoor lighting. Clear any walking paths of anything that might make someone trip, such as rocks or tools. Regularly check to see if handrails are loose or broken. Make sure that both sides of any steps have handrails. Any raised decks and porches should have guardrails on the edges. Have any leaves, snow, or ice cleared regularly. Use sand or salt on walking paths during winter. Clean up any spills in your garage right away. This includes oil or grease spills. What can I do in the bathroom? Use night lights. Install grab bars by the toilet and in the tub and shower. Do not use towel bars as grab bars. Use non-skid mats or decals in the tub or shower. If you need to sit down in the shower, use a plastic,  non-slip stool. Keep the floor dry. Clean up any water that spills on the floor as soon as it happens. Remove soap buildup in the tub or shower regularly. Attach bath mats securely with double-sided non-slip rug tape. Do not have throw rugs and other things on the floor that can make you trip. What can I do in the bedroom? Use night lights. Make sure that you have a light by your bed that is easy to reach. Do not use any sheets or blankets that are too big for your bed. They should not hang down onto the floor. Have a firm chair that has side arms. You can use this for support while you get dressed. Do not have throw rugs and other things on the floor that can make you trip. What can I do in the kitchen? Clean up any spills right away. Avoid walking on wet floors. Keep items that you use a lot in easy-to-reach places. If you need to reach something above you, use a strong step stool that has a grab bar. Keep electrical cords out  of the way. Do not use floor polish or wax that makes floors slippery. If you must use wax, use non-skid floor wax. Do not have throw rugs and other things on the floor that can make you trip. What can I do with my stairs? Do not leave any items on the stairs. Make sure that there are handrails on both sides of the stairs and use them. Fix handrails that are broken or loose. Make sure that handrails are as long as the stairways. Check any carpeting to make sure that it is firmly attached to the stairs. Fix any carpet that is loose or worn. Avoid having throw rugs at the top or bottom of the stairs. If you do have throw rugs, attach them to the floor with carpet tape. Make sure that you have a light switch at the top of the stairs and the bottom of the stairs. If you do not have them, ask someone to add them for you. What else can I do to help prevent falls? Wear shoes that: Do not have high heels. Have rubber bottoms. Are comfortable and fit you well. Are closed at the toe. Do not wear sandals. If you use a stepladder: Make sure that it is fully opened. Do not climb a closed stepladder. Make sure that both sides of the stepladder are locked into place. Ask someone to hold it for you, if possible. Clearly mark and make sure that you can see: Any grab bars or handrails. First and last steps. Where the edge of each step is. Use tools that help you move around (mobility aids) if they are needed. These include: Canes. Walkers. Scooters. Crutches. Turn on the lights when you go into a dark area. Replace any light bulbs as soon as they burn out. Set up your furniture so you have a clear path. Avoid moving your furniture around. If any of your floors are uneven, fix them. If there are any pets around you, be aware of where they are. Review your medicines with your doctor. Some medicines can make you feel dizzy. This can increase your chance of falling. Ask your doctor what other things that  you can do to help prevent falls. This information is not intended to replace advice given to you by your health care provider. Make sure you discuss any questions you have with your health care provider. Document Released: 07/09/2009 Document Revised: 02/18/2016 Document  Reviewed: 10/17/2014 Elsevier Interactive Patient Education  2017 [employer].

## 2023-01-30 NOTE — Progress Notes (Signed)
Subjective:   Kerri Mills is a 89 y.o. female who presents for Medicare Annual (Subsequent) preventive examination.  Review of Systems    I connected with  Kerri Mills on 01/30/23 by a audio enabled telemedicine application and verified that I am speaking with the correct person using two identifiers.  Patient Location: Home  Provider Location: Home Office  I discussed the limitations of evaluation and management by telemedicine. The patient expressed understanding and agreed to proceed.  Cardiac Risk Factors include: advanced age (>76men, >32 women);Other (see comment), Risk factor comments: A Fib     Objective:    Today's Vitals   01/30/23 1556  Weight: 173 lb (78.5 kg)   Body mass index is 28.35 kg/m.     01/30/2023    4:09 PM 12/06/2022   11:00 PM 11/28/2022    7:44 PM 11/28/2022    2:22 PM 10/11/2022    4:05 PM 10/11/2022   11:26 AM 02/17/2022   11:38 AM  Advanced Directives  Does Patient Have a Medical Advance Directive? No Yes Yes Yes Yes No Yes  Type of Science writer of [employer] Power of [place];Living will Healthcare Power of [place];Living will  Healthcare Power of [place];Living will  Does patient want to make changes to medical advance directive?  No - Guardian declined No - Guardian declined  No - Patient declined    Copy of Healthcare Power of Attorney in Chart?  No - copy requested No - copy requested  Yes - validated most recent copy scanned in chart (See row information)    Would patient like information on creating a medical advance directive? Yes (MAU/Ambulatory/Procedural Areas - Information given)    No - Patient declined No - Patient declined     Current Medications (verified) Outpatient Encounter Medications as of 01/30/2023  Medication Sig   acetaminophen (TYLENOL) 650 MG CR tablet Take 650 mg by mouth every 8 (eight) hours as needed for pain.   amiodarone (PACERONE) 200 MG tablet TAKE 1  TABLET BY MOUTH DAILY   cetirizine (ZYRTEC) 5 MG tablet Take 1 tablet (5 mg total) by mouth daily.   cholecalciferol (VITAMIN D3) 25 MCG (1000 UNIT) tablet Take 2,000 Units by mouth daily.   DULoxetine (CYMBALTA) 60 MG capsule TAKE ONE CAPSULE AT BEDTIME   ELIQUIS 5 MG TABS tablet TAKE ONE TABLET BY MOUTH TWICE DAILY   furosemide (LASIX) 20 MG tablet Take 1 tablet (20 mg total) by mouth as directed. Take 1 tablet daily for three days then take every other day thereafter.   gabapentin (NEURONTIN) 300 MG capsule Take 2 capsules (600 mg total) by mouth at bedtime.   levothyroxine (SYNTHROID) 125 MCG tablet Take 1 tablet (125 mcg total) by mouth daily.   lovastatin (MEVACOR) 40 MG tablet TAKE 1 TABLET BY MOUTH DAILY   metoprolol tartrate (LOPRESSOR) 50 MG tablet TAKE 1.5 TABLETS BY MOUTH TWICE DAILY.   Multiple Vitamins-Minerals (PRESERVISION AREDS 2+MULTI VIT PO) Take 1 tablet by mouth 2 (two) times daily.   omeprazole (PRILOSEC) 20 MG capsule TAKE 1 CAPSULE BY MOUTH ONCE DAILY   Polyethyl Glycol-Propyl Glycol (SYSTANE OP) Place 1 drop into both eyes daily as needed (for dry eyes).   polyethylene glycol powder (GLYCOLAX/MIRALAX) 17 GM/SCOOP powder Take 17 g by mouth daily.   potassium chloride (KLOR-CON M) 10 MEQ tablet Take by mouth.   Potassium Chloride CRYS Take 10 mEq by mouth as directed. Take 1 tablet  daily for three days then take every other day with furosemide thereafter   Probiotic Product (ALIGN) 4 MG CAPS One capsule daily while on antibiotic and for two weeks after complete antibiotic   triamcinolone cream (KENALOG) 0.1 % Apply 1 application. topically 2 (two) times daily.   White Petrolatum-Mineral Oil (GENTEAL TEARS NIGHT-TIME OP) Place 1 application into both eyes at bedtime. Night time ointment 3.5g   albuterol (VENTOLIN HFA) 108 (90 Base) MCG/ACT inhaler Inhale 2 puffs into the lungs every 6 (six) hours as needed for wheezing or shortness of breath. (Patient not taking: Reported on  01/30/2023)   No facility-administered encounter medications on file as of 01/30/2023.    Allergies (verified) Lipitor [atorvastatin calcium] and Penicillins   History: Past Medical History:  Diagnosis Date   Allergy    Anxiety    Arthritis    Atypical chest pain    a. 10/2018 MV: small, fixed apical defect possibly 2/2 attenuation artifact. No ischemia.  EF 68%.   Clotting disorder (HCC)    Colitis    Depression    Diverticulitis 2013   Dysrhythmia    Gastric ulcer    GERD (gastroesophageal reflux disease)    History of echocardiogram    a. 09/2019 Echo: EF 55-60%, mod LVH. Mildly dil LA. Triv MR/TR.   Hypercholesterolemia    Hypertension    Hypothyroidism    Infiltrating lobular carcinoma of left breast 2011   T2,N0, ER: 90%; PR 0%; Her 2 neu not amplified. [facility]).   Melanoma (HCC) 1997   Melanoma in situ of upper extremity (HCC) 03/19/2011   Persistent atrial fibrillation (HCC)    a. CHADS2VASc => 4 (HTN, age x 2, female)   Personal history of radiation therapy 2011   BREAST CA   Presence of permanent cardiac pacemaker    Seroma    HISTORY OF LFT BREAST   Sleep apnea    Thyroid cancer (HCC) 1992   Past Surgical History:  Procedure Laterality Date   ABDOMINAL HYSTERECTOMY  1973   partial   BREAST BIOPSY Left 02-13-13   BENIGN BREAST TISSUE WITH CHANGES CONSISTENT WITH FAT NECROSIS   BREAST BIOPSY Left 01/21/2015   bx done in [facility] [facility] 11:00 left 6-8cmfn   BREAST EXCISIONAL BIOPSY Left 1995   neg   BREAST EXCISIONAL BIOPSY Left 2011   Breast cancer radiation   BREAST LUMPECTOMY Left 2011   BREAST CA   CARDIAC CATHETERIZATION     CHOLECYSTECTOMY     COLONOSCOPY  2013   COLONOSCOPY WITH PROPOFOL N/A 06/09/2021   Procedure: COLONOSCOPY WITH PROPOFOL;  Surgeon: Wyline Mood, MD;  Location[facility] ENDOSCOPY;  Service: Gastroenterology;  Laterality: N/A;   HAMMER TOE SURGERY Bilateral 02/24/2022   Procedure: HAMMER TOE CORRECTION 2, 3, 4 Right and 2nd  Left;  Surgeon: Felecia Shelling, DPM;  Location: [facility] ORS;  Service: Podiatry;  Laterality: Bilateral;   MELANOMA EXCISION     RT UPPER ARM   OPEN REDUCTION INTERNAL FIXATION (ORIF) DISTAL RADIAL FRACTURE Right 12/06/2022   Procedure: OPEN REDUCTION INTERNAL FIXATION (ORIF) DISTAL RADIUS FRACTURE;  Surgeon: Juanell Fairly, MD;  Location: [facility] ORS;  Service: Orthopedics;  Laterality: Right;   PACEMAKER IMPLANT N/A 01/10/2020   Procedure: PACEMAKER IMPLANT;  Surgeon: Regan Lemming, MD;  Location: [facility] INVASIVE CV LAB;  Service: Cardiovascular;  Laterality: N/A;   PARTIAL HYSTERECTOMY     bleeding, ovaries in place.     THYROID SURGERY  1992   FOR THYROID  CANCER   TONSILLECTOMY     Family History  Problem Relation Age of Onset   Heart disease Mother    Cancer Sister        breast   Cancer Brother        lung    Breast cancer Neg Hx    Social History   Socioeconomic History   Marital status: Widowed    Spouse name: Not on file   Number of children: Not on file   Years of education: Not on file   Highest education level: Not on file  Occupational History   Not on file  Tobacco Use   Smoking status: Never   Smokeless tobacco: Never   Tobacco comments:    never  Vaping Use   Vaping Use: Never used  Substance and Sexual Activity   Alcohol use: Not Currently    Comment: once in a while   Drug use: No   Sexual activity: Never  Other Topics Concern   Not on file  Social History Narrative   Independent and baseline. Lives by herself   Social Determinants of Health   Financial Resource Strain: Low Risk  (01/30/2023)   Overall Financial Resource Strain (CARDIA)    Difficulty of Paying Living Expenses: Not hard at all  Food Insecurity: No Food Insecurity (01/30/2023)   Hunger Vital Sign    Worried About Running Out of Food in the Last Year: Never true    Ran Out of Food in the Last Year: Never true  Transportation Needs: No Transportation Needs (01/30/2023)   PRAPARE -  Administrator, Civil Service (Medical): No    Lack of Transportation (Non-Medical): No  Physical Activity: Sufficiently Active (02/08/2022)   Exercise Vital Sign    Days of Exercise per Week: 4 days    Minutes of Exercise per Session: 40 min  Stress: No Stress Concern Present (01/30/2023)   [employer] of Occupational Health - Occupational Stress Questionnaire    Feeling of Stress : Not at all  Social Connections: Socially Integrated (01/30/2023)   Social Connection and Isolation Panel [NHANES]    Frequency of Communication with Friends and Family: More than three times a week    Frequency of Social Gatherings with Friends and Family: More than three times a week    Attends Religious Services: More than 4 times per year    Active Member of [employer] or Organizations: Yes    Attends Engineer, structural: More than 4 times per year    Marital Status: Married    Tobacco Counseling Counseling given: Yes Tobacco comments: never   Clinical Intake:  Pre-visit preparation completed: Yes  Pain : No/denies pain     BMI - recorded: 28.35 Nutritional Status: BMI 25 -29 Overweight Nutritional Risks: None Diabetes: No  How often do you need to have someone help you when you read instructions, pamphlets, or other written materials from your doctor or pharmacy?: 1 - Never  Diabetic?no  Interpreter Needed?: No  Information entered by :: Fredirick Maudlin   Activities of Daily Living    01/30/2023    4:10 PM 12/06/2022   11:12 PM  In your present state of health, do you have any difficulty performing the following activities:  Hearing? 0   Vision? 0   Difficulty concentrating or making decisions? 0   Walking or climbing stairs? 1   Comment uses a walker   Dressing or bathing? 0   Doing errands, shopping? 1 0  Comment daughter takes to appointment   Preparing Food and eating ? N   Using the Toilet? N   In the past six months, have you accidently leaked  urine? Y   Do you have problems with loss of bowel control? N   Managing your Medications? N   Managing your Finances? Y   Comment daugther helps   Housekeeping or managing your Housekeeping? Y     Patient Care Team: Dale [place], MD as PCP - General (Internal Medicine) Antonieta Iba, MD as PCP - Cardiology (Cardiology) Regan Lemming, MD as PCP - Electrophysiology (Cardiology) Lemar Livings, Merrily Pew, MD (General Surgery) Virl Cagey, MD (Internal Medicine)  Indicate any recent Medical Services you may have received from other than [facility] providers in the past year (date may be approximate).     Assessment:   This is a routine wellness examination for Kerri Mills.  Hearing/Vision screen Hearing Screening - Comments:: Denies hearing difficulties   Vision Screening - Comments:: Wears rx glasses - up to date with [facility] exams with  [facility]  Dietary issues and exercise activities discussed: Current Exercise Habits: Structured exercise class, Type of exercise: Other - see comments;stretching (zamba), Time (Minutes): 20, Frequency (Times/Week): 3, Weekly Exercise (Minutes/Week): 60, Intensity: Mild   Goals Addressed             This Visit's Progress    Follow up with Primary Care Provider   On track    As needed.      Depression Screen    01/30/2023    4:04 PM 10/11/2022    8:58 AM 08/30/2022    1:08 PM 05/27/2022    2:20 PM 03/23/2022   10:42 AM 02/08/2022    9:28 AM 11/29/2021   11:13 AM  PHQ 2/9 Scores  PHQ - 2 Score 0 0 0 0 1 0 0    Fall Risk    01/30/2023    4:10 PM 10/11/2022    8:57 AM 08/30/2022    1:07 PM 05/27/2022    2:20 PM 03/23/2022   10:42 AM  Fall Risk   Falls in the past year? 0 0 0 0 0  Number falls in past yr: 0 0 0 0   Injury with Fall? 0 0 0 0   Risk for fall due to : No Fall Risks No Fall Risks No Fall Risks No Fall Risks Impaired balance/gait  Follow up Falls prevention discussed;Falls evaluation completed Falls evaluation  completed Falls evaluation completed Falls evaluation completed Falls evaluation completed    FALL RISK PREVENTION PERTAINING TO THE HOME:  Any stairs in or around the home? No  If so, are there any without handrails? No  Home free of loose throw rugs in walkways, pet beds, electrical cords, etc? No  Adequate lighting in your home to reduce risk of falls? No   ASSISTIVE DEVICES UTILIZED TO PREVENT FALLS:  Life alert? Yes  Use of a cane, walker or w/c? Yes  Grab bars in the bathroom? Yes  Shower chair or bench in shower? Yes  Elevated toilet seat or a handicapped toilet? Yes   TIMED UP AND GO:  Was the test performed? No . Televisit  Cognitive Function:    07/03/2017   12:16 PM 07/01/2016   10:57 AM 07/01/2015   11:13 AM  MMSE - Mini Mental State Exam  Orientation to time 5 5 5   Orientation to Place 5 5 5   Registration 3 3 3   Attention/ Calculation  5 5 5   Recall 3 3 3   Language- name 2 objects 2 2 2   Language- repeat 1 1 1   Language- follow 3 step command 3 3 3   Language- read & follow direction 1 1 1   Write a sentence 1 1 1   Copy design 1 1 1   Total score 30 30 30         01/30/2023    4:05 PM 07/27/2020    9:59 AM 07/25/2019   12:28 PM 07/19/2018   11:58 AM  6CIT Screen  What Year? 0 points 0 points 0 points 0 points  What month? 0 points 0 points 0 points 0 points  What time? 0 points 0 points 0 points 0 points  Count back from 20 0 points 0 points 0 points 0 points  Months in reverse 0 points 0 points 0 points 0 points  Repeat phrase   0 points 0 points  Total Score   0 points 0 points    Immunizations Immunization History  Administered Date(s) Administered   Fluad Quad(high Dose 65+) 06/06/2019, 07/07/2020, 06/21/2021, 07/14/2022   Influenza Split 07/18/2014   Influenza, High Dose Seasonal PF 07/01/2015, 06/13/2016, 05/23/2017, 06/25/2018   Influenza,inj,Quad PF,6+ Mos 06/11/2013   Moderna Covid-19 Vaccine Bivalent Booster 50yrs & up 09/04/2021,  07/14/2022   Moderna Sars-Covid-2 Vaccination 10/08/2019, 11/05/2019, 07/24/2020   Pneumococcal Conjugate-13 12/18/2013   Pneumococcal Polysaccharide-23 07/01/2015   Tdap 11/15/2016   Zoster Recombinat (Shingrix) 06/25/2018    TDAP status: Up to date  Flu Vaccine status: Up to date  Pneumococcal vaccine status: Up to date  Covid-19 vaccine status: Information provided on how to obtain vaccines.   Qualifies for Shingles Vaccine? Yes   Zostavax completed No   Shingrix Completed?: No.    Education has been provided regarding the importance of this vaccine. Patient has been advised to call insurance company to determine out of pocket expense if they have not yet received this vaccine. Advised may also receive vaccine at local pharmacy or Health Dept. Verbalized acceptance and understanding.  Screening Tests Health Maintenance  Topic Date Due   Zoster Vaccines- Shingrix (2 of 2) 08/20/2018   COVID-19 Vaccine (6 - 2023-24 season) 09/08/2022   MAMMOGRAM  01/11/2023   INFLUENZA VACCINE  04/27/2023   Medicare Annual Wellness (AWV)  01/30/2024   DTaP/Tdap/Td (2 - Td or Tdap) 11/15/2026   Pneumonia Vaccine 63+ Years old  Completed   DEXA SCAN  Completed   HPV VACCINES  Aged Out    Health Maintenance  Health Maintenance Due  Topic Date Due   Zoster Vaccines- Shingrix (2 of 2) 08/20/2018   COVID-19 Vaccine (6 - 2023-24 season) 09/08/2022   MAMMOGRAM  01/11/2023    Colorectal cancer screening: No longer required.   Mammogram status: No longer required due to age.  Bone Density status: Completed 04/14/19. Results reflect: Bone density results: OSTEOPOROSIS. Repeat every 2 years.  Lung Cancer Screening: (Low Dose CT Chest recommended if Age 87-80 years, 30 pack-year currently smoking OR have quit w/in 15years.) does not qualify.     Additional Screening:  Hepatitis C Screening: does not qualify  Vision Screening: Recommended annual ophthalmology exams for early detection of  glaucoma and other disorders of the eye. Is the patient up to date with their annual eye exam?  Yes  Who is the provider or what is the name of the office in which the patient attends annual eye exams? [facility] If pt is not established with a provider,  would they like to be referred to a provider to establish care? No .   Dental Screening: Recommended annual dental exams for proper oral hygiene  Community Resource Referral / Chronic Care Management: CRR required this visit?  No   CCM required this visit?  No      Plan:     I have personally reviewed and noted the following in the patient's chart:   Medical and social history Use of alcohol, tobacco or illicit drugs  Current medications and supplements including opioid prescriptions. Patient is not currently taking opioid prescriptions. Functional ability and status Nutritional status Physical activity Advanced directives List of other physicians Hospitalizations, surgeries, and ER visits in previous 12 months Vitals Screenings to include cognitive, depression, and falls Referrals and appointments  In addition, I have reviewed and discussed with patient certain preventive protocols, quality metrics, and best practice recommendations. A written personalized care plan for preventive services as well as general preventive health recommendations were provided to patient.     Annabell Sabal, CMA   01/30/2023   Nurse Notes: None

## 2023-02-01 DIAGNOSIS — H353221 Exudative age-related macular degeneration, left eye, with active choroidal neovascularization: Secondary | ICD-10-CM | POA: Diagnosis not present

## 2023-02-03 ENCOUNTER — Encounter (HOSPITAL_BASED_OUTPATIENT_CLINIC_OR_DEPARTMENT_OTHER)
Admission: RE | Admit: 2023-02-03 | Discharge: 2023-02-03 | Disposition: A | Discharge status: Home / Self Care | Payer: PPO | Source: Ambulatory Visit | Attending: Cardiology | Admitting: Cardiology

## 2023-02-03 DIAGNOSIS — I4819 Other persistent atrial fibrillation: Secondary | ICD-10-CM | POA: Diagnosis not present

## 2023-02-03 DIAGNOSIS — Z85850 Personal history of malignant neoplasm of thyroid: Secondary | ICD-10-CM | POA: Diagnosis not present

## 2023-02-03 DIAGNOSIS — F419 Anxiety disorder, unspecified: Secondary | ICD-10-CM | POA: Diagnosis not present

## 2023-02-03 DIAGNOSIS — E876 Hypokalemia: Secondary | ICD-10-CM | POA: Diagnosis not present

## 2023-02-03 DIAGNOSIS — Z950 Presence of cardiac pacemaker: Secondary | ICD-10-CM | POA: Diagnosis not present

## 2023-02-03 DIAGNOSIS — Z1152 Encounter for screening for COVID-19: Secondary | ICD-10-CM | POA: Diagnosis not present

## 2023-02-03 DIAGNOSIS — E7800 Pure hypercholesterolemia, unspecified: Secondary | ICD-10-CM | POA: Diagnosis not present

## 2023-02-03 DIAGNOSIS — Z7901 Long term (current) use of anticoagulants: Secondary | ICD-10-CM | POA: Diagnosis not present

## 2023-02-03 DIAGNOSIS — R0602 Shortness of breath: Secondary | ICD-10-CM | POA: Insufficient documentation

## 2023-02-03 DIAGNOSIS — I10 Essential (primary) hypertension: Secondary | ICD-10-CM | POA: Diagnosis not present

## 2023-02-03 DIAGNOSIS — G4733 Obstructive sleep apnea (adult) (pediatric): Secondary | ICD-10-CM | POA: Diagnosis not present

## 2023-02-03 DIAGNOSIS — Z923 Personal history of irradiation: Secondary | ICD-10-CM | POA: Diagnosis not present

## 2023-02-03 DIAGNOSIS — I495 Sick sinus syndrome: Secondary | ICD-10-CM | POA: Diagnosis not present

## 2023-02-03 DIAGNOSIS — E039 Hypothyroidism, unspecified: Secondary | ICD-10-CM | POA: Diagnosis not present

## 2023-02-03 DIAGNOSIS — J9601 Acute respiratory failure with hypoxia: Secondary | ICD-10-CM | POA: Diagnosis not present

## 2023-02-03 DIAGNOSIS — I5023 Acute on chronic systolic (congestive) heart failure: Secondary | ICD-10-CM | POA: Diagnosis not present

## 2023-02-03 DIAGNOSIS — Z66 Do not resuscitate: Secondary | ICD-10-CM | POA: Diagnosis not present

## 2023-02-03 DIAGNOSIS — J811 Chronic pulmonary edema: Secondary | ICD-10-CM | POA: Diagnosis not present

## 2023-02-03 DIAGNOSIS — J9621 Acute and chronic respiratory failure with hypoxia: Secondary | ICD-10-CM | POA: Diagnosis not present

## 2023-02-03 DIAGNOSIS — I110 Hypertensive heart disease with heart failure: Secondary | ICD-10-CM | POA: Diagnosis not present

## 2023-02-03 DIAGNOSIS — K219 Gastro-esophageal reflux disease without esophagitis: Secondary | ICD-10-CM | POA: Diagnosis not present

## 2023-02-03 DIAGNOSIS — J479 Bronchiectasis, uncomplicated: Secondary | ICD-10-CM | POA: Diagnosis not present

## 2023-02-03 DIAGNOSIS — Z79890 Hormone replacement therapy (postmenopausal): Secondary | ICD-10-CM | POA: Diagnosis not present

## 2023-02-03 DIAGNOSIS — F32A Depression, unspecified: Secondary | ICD-10-CM | POA: Diagnosis not present

## 2023-02-03 DIAGNOSIS — Z853 Personal history of malignant neoplasm of breast: Secondary | ICD-10-CM | POA: Diagnosis not present

## 2023-02-03 DIAGNOSIS — Z90711 Acquired absence of uterus with remaining cervical stump: Secondary | ICD-10-CM | POA: Diagnosis not present

## 2023-02-03 DIAGNOSIS — I480 Paroxysmal atrial fibrillation: Secondary | ICD-10-CM | POA: Diagnosis not present

## 2023-02-03 DIAGNOSIS — Z8249 Family history of ischemic heart disease and other diseases of the circulatory system: Secondary | ICD-10-CM | POA: Diagnosis not present

## 2023-02-03 DIAGNOSIS — Z85820 Personal history of malignant melanoma of skin: Secondary | ICD-10-CM | POA: Diagnosis not present

## 2023-02-03 MED ORDER — TECHNETIUM TC 99M TETROFOSMIN IV KIT
10.0000 | PACK | Freq: Once | INTRAVENOUS | Status: AC | PRN
  Administered 2023-02-03: 10.85 via INTRAVENOUS

## 2023-02-03 MED ORDER — REGADENOSON 0.4 MG/5ML IV SOLN
0.4000 mg | Freq: Once | INTRAVENOUS | Status: AC
  Administered 2023-02-03: 0.4 mg via INTRAVENOUS
  Filled 2023-02-03: qty 5

## 2023-02-03 MED ORDER — TECHNETIUM TC 99M TETROFOSMIN IV KIT
31.4200 | PACK | Freq: Once | INTRAVENOUS | Status: AC | PRN
  Administered 2023-02-03: 31.42 via INTRAVENOUS

## 2023-02-04 ENCOUNTER — Inpatient Hospital Stay
Admission: EM | Admit: 2023-02-04 | Discharge: 2023-02-07 | DRG: 291 | Disposition: A | Discharge status: Home / Self Care | Payer: PPO | Attending: Internal Medicine | Admitting: Internal Medicine

## 2023-02-04 ENCOUNTER — Other Ambulatory Visit: Payer: Self-pay

## 2023-02-04 ENCOUNTER — Emergency Department: Payer: PPO

## 2023-02-04 DIAGNOSIS — I4891 Unspecified atrial fibrillation: Secondary | ICD-10-CM | POA: Diagnosis present

## 2023-02-04 DIAGNOSIS — Z950 Presence of cardiac pacemaker: Secondary | ICD-10-CM

## 2023-02-04 DIAGNOSIS — F32A Depression, unspecified: Secondary | ICD-10-CM | POA: Diagnosis present

## 2023-02-04 DIAGNOSIS — R531 Weakness: Secondary | ICD-10-CM

## 2023-02-04 DIAGNOSIS — I480 Paroxysmal atrial fibrillation: Secondary | ICD-10-CM

## 2023-02-04 DIAGNOSIS — Z85850 Personal history of malignant neoplasm of thyroid: Secondary | ICD-10-CM

## 2023-02-04 DIAGNOSIS — Z79899 Other long term (current) drug therapy: Secondary | ICD-10-CM

## 2023-02-04 DIAGNOSIS — R0602 Shortness of breath: Secondary | ICD-10-CM | POA: Diagnosis present

## 2023-02-04 DIAGNOSIS — E876 Hypokalemia: Secondary | ICD-10-CM | POA: Diagnosis present

## 2023-02-04 DIAGNOSIS — J9601 Acute respiratory failure with hypoxia: Secondary | ICD-10-CM | POA: Diagnosis not present

## 2023-02-04 DIAGNOSIS — Z79890 Hormone replacement therapy (postmenopausal): Secondary | ICD-10-CM

## 2023-02-04 DIAGNOSIS — J9621 Acute and chronic respiratory failure with hypoxia: Secondary | ICD-10-CM | POA: Diagnosis present

## 2023-02-04 DIAGNOSIS — J9691 Respiratory failure, unspecified with hypoxia: Secondary | ICD-10-CM | POA: Diagnosis present

## 2023-02-04 DIAGNOSIS — Z90711 Acquired absence of uterus with remaining cervical stump: Secondary | ICD-10-CM

## 2023-02-04 DIAGNOSIS — Z853 Personal history of malignant neoplasm of breast: Secondary | ICD-10-CM

## 2023-02-04 DIAGNOSIS — Z1152 Encounter for screening for COVID-19: Secondary | ICD-10-CM

## 2023-02-04 DIAGNOSIS — Z66 Do not resuscitate: Secondary | ICD-10-CM | POA: Diagnosis present

## 2023-02-04 DIAGNOSIS — F419 Anxiety disorder, unspecified: Secondary | ICD-10-CM | POA: Diagnosis present

## 2023-02-04 DIAGNOSIS — E039 Hypothyroidism, unspecified: Secondary | ICD-10-CM | POA: Diagnosis present

## 2023-02-04 DIAGNOSIS — G4733 Obstructive sleep apnea (adult) (pediatric): Secondary | ICD-10-CM | POA: Diagnosis present

## 2023-02-04 DIAGNOSIS — I5023 Acute on chronic systolic (congestive) heart failure: Secondary | ICD-10-CM | POA: Diagnosis present

## 2023-02-04 DIAGNOSIS — I110 Hypertensive heart disease with heart failure: Principal | ICD-10-CM | POA: Diagnosis present

## 2023-02-04 DIAGNOSIS — Z8249 Family history of ischemic heart disease and other diseases of the circulatory system: Secondary | ICD-10-CM | POA: Diagnosis not present

## 2023-02-04 DIAGNOSIS — J479 Bronchiectasis, uncomplicated: Secondary | ICD-10-CM | POA: Diagnosis present

## 2023-02-04 DIAGNOSIS — I10 Essential (primary) hypertension: Secondary | ICD-10-CM | POA: Diagnosis not present

## 2023-02-04 DIAGNOSIS — Z85820 Personal history of malignant melanoma of skin: Secondary | ICD-10-CM

## 2023-02-04 DIAGNOSIS — E7800 Pure hypercholesterolemia, unspecified: Secondary | ICD-10-CM | POA: Diagnosis present

## 2023-02-04 DIAGNOSIS — Z7901 Long term (current) use of anticoagulants: Secondary | ICD-10-CM

## 2023-02-04 DIAGNOSIS — Z923 Personal history of irradiation: Secondary | ICD-10-CM

## 2023-02-04 DIAGNOSIS — I4819 Other persistent atrial fibrillation: Secondary | ICD-10-CM | POA: Diagnosis present

## 2023-02-04 DIAGNOSIS — K219 Gastro-esophageal reflux disease without esophagitis: Secondary | ICD-10-CM | POA: Diagnosis present

## 2023-02-04 DIAGNOSIS — I495 Sick sinus syndrome: Secondary | ICD-10-CM | POA: Diagnosis present

## 2023-02-04 LAB — COMPREHENSIVE METABOLIC PANEL
ALT: 23 U/L (ref 0–44)
AST: 29 U/L (ref 15–41)
Albumin: 3.2 g/dL — ABNORMAL LOW (ref 3.5–5.0)
Alkaline Phosphatase: 114 U/L (ref 38–126)
Anion gap: 7 (ref 5–15)
BUN: 14 mg/dL (ref 8–23)
CO2: 25 mmol/L (ref 22–32)
Calcium: 8.9 mg/dL (ref 8.9–10.3)
Chloride: 101 mmol/L (ref 98–111)
Creatinine, Ser: 0.73 mg/dL (ref 0.44–1.00)
GFR, Estimated: >60 mL/min (ref >60)
Glucose, Bld: 120 mg/dL — ABNORMAL HIGH (ref 70–99)
Potassium: 3.9 mmol/L (ref 3.5–5.1)
Sodium: 133 mmol/L — ABNORMAL LOW (ref 135–145)
Total Bilirubin: 1 mg/dL (ref 0.3–1.2)
Total Protein: 6.9 g/dL (ref 6.5–8.1)

## 2023-02-04 LAB — CBC WITH DIFFERENTIAL/PLATELET
Abs Immature Granulocytes: 0.05 10*3/uL (ref 0.00–0.07)
Basophils Absolute: 0 10*3/uL (ref 0.0–0.1)
Basophils Relative: 0 %
Eosinophils Absolute: 0.1 10*3/uL (ref 0.0–0.5)
Eosinophils Relative: 1 %
HCT: 36.6 % (ref 36.0–46.0)
Hemoglobin: 11.6 g/dL — ABNORMAL LOW (ref 12.0–15.0)
Immature Granulocytes: 1 %
Lymphocytes Relative: 12 %
Lymphs Abs: 1.1 10*3/uL (ref 0.7–4.0)
MCH: 30.9 pg (ref 26.0–34.0)
MCHC: 31.7 g/dL (ref 30.0–36.0)
MCV: 97.6 fL (ref 80.0–100.0)
Monocytes Absolute: 0.9 10*3/uL (ref 0.1–1.0)
Monocytes Relative: 10 %
Neutro Abs: 7.1 10*3/uL (ref 1.7–7.7)
Neutrophils Relative %: 76 %
Platelets: 298 10*3/uL (ref 150–400)
RBC: 3.75 MIL/uL — ABNORMAL LOW (ref 3.87–5.11)
RDW: 12.4 % (ref 11.5–15.5)
WBC: 9.3 10*3/uL (ref 4.0–10.5)
nRBC: 0 % (ref 0.0–0.2)

## 2023-02-04 LAB — NM MYOCAR MULTI W/SPECT W/WALL MOTION / EF
Estimated workload: 1
Exercise duration (min): 0 min
Exercise duration (sec): 0 s
LV dias vol: 93 mL (ref 46–106)
LV sys vol: 57 mL
MPHR: 131 {beats}/min
Nuc Stress EF: 39 %
Peak HR: 96 {beats}/min
Percent HR: 73 %
Rest HR: 81 {beats}/min
Rest Nuclear Isotope Dose: 10.9 mCi
SDS: 2
SRS: 25
SSS: 13
ST Depression (mm): 0 mm
Stress Nuclear Isotope Dose: 31.4 mCi
TID: 1.04

## 2023-02-04 LAB — BRAIN NATRIURETIC PEPTIDE: B Natriuretic Peptide: 1168.6 pg/mL — ABNORMAL HIGH (ref 0.0–100.0)

## 2023-02-04 LAB — TROPONIN I (HIGH SENSITIVITY)
Troponin I (High Sensitivity): 11 ng/L (ref <18)
Troponin I (High Sensitivity): 11 ng/L (ref <18)

## 2023-02-04 LAB — RESP PANEL BY RT-PCR (RSV, FLU A&B, COVID)  RVPGX2
Influenza A by PCR: NEGATIVE
Influenza B by PCR: NEGATIVE
Resp Syncytial Virus by PCR: NEGATIVE
SARS Coronavirus 2 by RT PCR: NEGATIVE

## 2023-02-04 LAB — PROCALCITONIN: Procalcitonin: <0.1 ng/mL

## 2023-02-04 MED ORDER — METOPROLOL TARTRATE 50 MG PO TABS
75.0000 mg | ORAL_TABLET | Freq: Two times a day (BID) | ORAL | Status: DC
  Administered 2023-02-04 – 2023-02-07 (×6): 75 mg via ORAL
  Filled 2023-02-04 (×5): qty 1
  Filled 2023-02-04: qty 3

## 2023-02-04 MED ORDER — SODIUM CHLORIDE 0.9% FLUSH
3.0000 mL | Freq: Two times a day (BID) | INTRAVENOUS | Status: DC
  Administered 2023-02-04 – 2023-02-07 (×5): 3 mL via INTRAVENOUS

## 2023-02-04 MED ORDER — APIXABAN 5 MG PO TABS
5.0000 mg | ORAL_TABLET | Freq: Two times a day (BID) | ORAL | Status: DC
  Administered 2023-02-04 – 2023-02-07 (×6): 5 mg via ORAL
  Filled 2023-02-04 (×6): qty 1

## 2023-02-04 MED ORDER — FUROSEMIDE 10 MG/ML IJ SOLN
60.0000 mg | Freq: Once | INTRAMUSCULAR | Status: AC
  Administered 2023-02-04: 60 mg via INTRAVENOUS
  Filled 2023-02-04: qty 8

## 2023-02-04 MED ORDER — LEVOTHYROXINE SODIUM 25 MCG PO TABS
125.0000 ug | ORAL_TABLET | Freq: Every day | ORAL | Status: DC
  Administered 2023-02-05 – 2023-02-07 (×3): 125 ug via ORAL
  Filled 2023-02-04 (×2): qty 3
  Filled 2023-02-04: qty 1

## 2023-02-04 MED ORDER — AMIODARONE HCL 200 MG PO TABS
200.0000 mg | ORAL_TABLET | Freq: Every day | ORAL | Status: DC
  Administered 2023-02-05 – 2023-02-07 (×3): 200 mg via ORAL
  Filled 2023-02-04 (×3): qty 1

## 2023-02-04 MED ORDER — SODIUM CHLORIDE 0.9% FLUSH: 3.0000 mL | INTRAVENOUS | Status: DC | PRN

## 2023-02-04 MED ORDER — SODIUM CHLORIDE 0.9 % IV SOLN: 250.0000 mL | INTRAVENOUS | Status: DC | PRN

## 2023-02-04 MED ORDER — ACETAMINOPHEN 325 MG PO TABS
650.0000 mg | ORAL_TABLET | ORAL | Status: DC | PRN
  Administered 2023-02-05: 650 mg via ORAL
  Filled 2023-02-04: qty 2

## 2023-02-04 MED ORDER — GABAPENTIN 300 MG PO CAPS
600.0000 mg | ORAL_CAPSULE | Freq: Every day | ORAL | Status: DC
  Administered 2023-02-04 – 2023-02-06 (×3): 600 mg via ORAL
  Filled 2023-02-04 (×3): qty 2

## 2023-02-04 MED ORDER — ONDANSETRON HCL 4 MG/2ML IJ SOLN: 4.0000 mg | Freq: Four times a day (QID) | INTRAMUSCULAR | Status: DC | PRN

## 2023-02-04 MED ORDER — FUROSEMIDE 10 MG/ML IJ SOLN: 60.0000 mg | Freq: Once | INTRAMUSCULAR | Status: DC

## 2023-02-04 MED ORDER — PRAVASTATIN SODIUM 40 MG PO TABS
40.0000 mg | ORAL_TABLET | Freq: Every day | ORAL | Status: DC
  Administered 2023-02-04 – 2023-02-06 (×3): 40 mg via ORAL
  Filled 2023-02-04: qty 1
  Filled 2023-02-04 (×3): qty 2

## 2023-02-04 MED ORDER — DULOXETINE HCL 30 MG PO CPEP
60.0000 mg | ORAL_CAPSULE | Freq: Every day | ORAL | Status: DC
  Administered 2023-02-04 – 2023-02-06 (×3): 60 mg via ORAL
  Filled 2023-02-04 (×2): qty 1
  Filled 2023-02-04: qty 2

## 2023-02-04 MED ORDER — FUROSEMIDE 10 MG/ML IJ SOLN
60.0000 mg | Freq: Every day | INTRAMUSCULAR | Status: DC
  Administered 2023-02-05: 60 mg via INTRAVENOUS
  Filled 2023-02-04: qty 8

## 2023-02-04 MED ORDER — PANTOPRAZOLE SODIUM 40 MG PO TBEC
40.0000 mg | DELAYED_RELEASE_TABLET | Freq: Every day | ORAL | Status: DC
  Administered 2023-02-05 – 2023-02-07 (×3): 40 mg via ORAL
  Filled 2023-02-04 (×3): qty 1

## 2023-02-04 NOTE — Assessment & Plan Note (Signed)
Continue home antihypertensives 

## 2023-02-04 NOTE — Assessment & Plan Note (Signed)
Presenting with shortness of breath and hypoxia, however secondary to heart failure.  Low suspicion this is related to patient's history of bronchiectasis.

## 2023-02-04 NOTE — H&P (Signed)
History and Physical    Patient: Kerri Mills ZOX:[id number] DOB: 02-07-1931 DOA: 02/04/2023 DOS: the patient was seen and examined on 02/04/2023 PCP: Dale [place], MD  Patient coming from: Home  Chief Complaint:  Chief Complaint  Patient presents with   Shortness of Breath   HPI: Kerri Mills is a 89 y.o. female with medical history significant of newly diagnosed HFrEF with Ef of 40%, tachy-brady syndrome s/p PPM, HTN, Atrial fibrillation on Eliquis, OSA, who presents to the ED with shortness of breathe.   Kerri Mills states that she has been experiencing shortness of breath for several years now but has gotten significantly worse over the last few weeks, in addition, she has noticed abdominal distention that has gotten worse over the last couple days and has made it difficult for her to have an appetite to eat.  She endorses chronic lower extremity edema that is relieved by compression stockings.  Over the last few days, her productive cough has gotten worse.  She is chills and nausea but denies any fever, vomiting, diarrhea.  ED Course:  On arrival to the ED, patient was hypertensive at 144/78 with heart rate of 81.  She was saturating at 92% on 4 L.  Initial workup remarkable for hemoglobin of 11.6, potassium 3.9, creatinine 0.73 with GFR above 60.  BNP elevated at 1168.  Troponin negative x 2.   COVID-19, influenza and RSV PCR negative.  Chest x-ray was obtained with patchy bilateral pulmonary opacities.  Patient started on IV Lasix and TRH contacted for admission.   Review of Systems: As mentioned in the history of present illness. All other systems reviewed and are negative.  Past Medical History:  Diagnosis Date   Allergy    Anxiety    Arthritis    Atypical chest pain    a. 10/2018 MV: small, fixed apical defect possibly 2/2 attenuation artifact. No ischemia.  EF 68%.   Clotting disorder (HCC)    Colitis    Depression    Diverticulitis 2013   Dysrhythmia    Gastric ulcer     GERD (gastroesophageal reflux disease)    History of echocardiogram    a. 09/2019 Echo: EF 55-60%, mod LVH. Mildly dil LA. Triv MR/TR.   Hypercholesterolemia    Hypertension    Hypothyroidism    Infiltrating lobular carcinoma of left breast 2011   T2,N0, ER: 90%; PR 0%; Her 2 neu not amplified. [facility]).   Melanoma (HCC) 1997   Melanoma in situ of upper extremity (HCC) 03/19/2011   Persistent atrial fibrillation (HCC)    a. CHADS2VASc => 4 (HTN, age x 2, female)   Personal history of radiation therapy 2011   BREAST CA   Presence of permanent cardiac pacemaker    Seroma    HISTORY OF LFT BREAST   Sleep apnea    Thyroid cancer (HCC) 1992   Past Surgical History:  Procedure Laterality Date   ABDOMINAL HYSTERECTOMY  1973   partial   BREAST BIOPSY Left 02-13-13   BENIGN BREAST TISSUE WITH CHANGES CONSISTENT WITH FAT NECROSIS   BREAST BIOPSY Left 01/21/2015   bx done in [place] office 11:00 left 6-8cmfn   BREAST EXCISIONAL BIOPSY Left 1995   neg   BREAST EXCISIONAL BIOPSY Left 2011   Breast cancer radiation   BREAST LUMPECTOMY Left 2011   BREAST CA   CARDIAC CATHETERIZATION     CHOLECYSTECTOMY     COLONOSCOPY  2013   COLONOSCOPY WITH PROPOFOL N/A 06/09/2021  Procedure: COLONOSCOPY WITH PROPOFOL;  Surgeon: Wyline Mood, MD;  Location: [facility] ENDOSCOPY;  Service: Gastroenterology;  Laterality: N/A;   HAMMER TOE SURGERY Bilateral 02/24/2022   Procedure: HAMMER TOE CORRECTION 2, 3, 4 Right and 2nd Left;  Surgeon: Felecia Shelling, DPM;  Location: [facility] ORS;  Service: Podiatry;  Laterality: Bilateral;   MELANOMA EXCISION     RT UPPER ARM   OPEN REDUCTION INTERNAL FIXATION (ORIF) DISTAL RADIAL FRACTURE Right 12/06/2022   Procedure: OPEN REDUCTION INTERNAL FIXATION (ORIF) DISTAL RADIUS FRACTURE;  Surgeon: Juanell Fairly, MD;  Location: [facility] ORS;  Service: Orthopedics;  Laterality: Right;   PACEMAKER IMPLANT N/A 01/10/2020   Procedure: PACEMAKER IMPLANT;  Surgeon: Regan Lemming, MD;  Location: [facility] INVASIVE CV LAB;  Service: Cardiovascular;  Laterality: N/A;   PARTIAL HYSTERECTOMY     bleeding, ovaries in place.     THYROID SURGERY  1992   FOR THYROID CANCER   TONSILLECTOMY     Social History:  reports that she has never smoked. She has never used smokeless tobacco. She reports that she does not currently use alcohol. She reports that she does not use drugs.  Allergies  Allergen Reactions   Lipitor [Atorvastatin Calcium] Other (See Comments)    Stiffness & soreness   Penicillins Rash    Family History  Problem Relation Age of Onset   Heart disease Mother    Cancer Sister        breast   Cancer Brother        lung    Breast cancer Neg Hx     Prior to Admission medications   Medication Sig Start Date End Date Taking? Authorizing Provider  acetaminophen (TYLENOL) 650 MG CR tablet Take 650 mg by mouth every 8 (eight) hours as needed for pain.    [provider]  albuterol (VENTOLIN HFA) 108 (90 Base) MCG/ACT inhaler Inhale 2 puffs into the lungs every 6 (six) hours as needed for wheezing or shortness of breath. Patient not taking: Reported on 01/30/2023 11/09/22   Dale [place], MD  amiodarone (PACERONE) 200 MG tablet TAKE 1 TABLET BY MOUTH DAILY 10/03/22   Camnitz, Andree Coss, MD  cetirizine (ZYRTEC) 5 MG tablet Take 1 tablet (5 mg total) by mouth daily. 11/22/22   Raechel Chute, MD  cholecalciferol (VITAMIN D3) 25 MCG (1000 UNIT) tablet Take 2,000 Units by mouth daily.    [provider]  DULoxetine (CYMBALTA) 60 MG capsule TAKE ONE CAPSULE AT BEDTIME 08/15/22   Dale [place], MD  ELIQUIS 5 MG TABS tablet TAKE ONE TABLET BY MOUTH TWICE DAILY 10/10/22   Antonieta Iba, MD  furosemide (LASIX) 20 MG tablet Take 1 tablet (20 mg total) by mouth as directed. Take 1 tablet daily for three days then take every other day thereafter. 01/27/23 04/27/23  Sherie Don, NP  gabapentin (NEURONTIN) 300 MG capsule Take 2 capsules (600 mg total) by  mouth at bedtime. 12/19/22   Dale [place], MD  levothyroxine (SYNTHROID) 125 MCG tablet Take 1 tablet (125 mcg total) by mouth daily. 11/10/22   Dale [place], MD  lovastatin (MEVACOR) 40 MG tablet TAKE 1 TABLET BY MOUTH DAILY 03/11/22   Sherlene Shams, MD  metoprolol tartrate (LOPRESSOR) 50 MG tablet TAKE 1.5 TABLETS BY MOUTH TWICE DAILY. 12/06/22   Antonieta Iba, MD  Multiple Vitamins-Minerals (PRESERVISION AREDS 2+MULTI VIT PO) Take 1 tablet by mouth 2 (two) times daily.    [provider]  omeprazole (PRILOSEC) 20 MG capsule TAKE  1 CAPSULE BY MOUTH ONCE DAILY 08/17/22   Dale [place], MD  Polyethyl Glycol-Propyl Glycol (SYSTANE OP) Place 1 drop into both eyes daily as needed (for dry eyes).    [provider]  polyethylene glycol powder (GLYCOLAX/MIRALAX) 17 GM/SCOOP powder Take 17 g by mouth daily. 09/10/20   Dale [place], MD  potassium chloride (KLOR-CON M) 10 MEQ tablet Take by mouth. 01/27/23   [provider]  Potassium Chloride CRYS Take 10 mEq by mouth as directed. Take 1 tablet daily for three days then take every other day with furosemide thereafter 01/27/23   Sherie Don, NP  Probiotic Product (ALIGN) 4 MG CAPS One capsule daily while on antibiotic and for two weeks after complete antibiotic 01/12/22   Dale [place], MD  triamcinolone cream (KENALOG) 0.1 % Apply 1 application. topically 2 (two) times daily. 02/16/22   Dale [place], MD  White Petrolatum-Mineral Oil (GENTEAL TEARS NIGHT-TIME OP) Place 1 application into both eyes at bedtime. Night time ointment 3.5g    [provider]    Physical Exam: Vitals:   02/04/23 1342 02/04/23 1700 02/04/23 1730 02/04/23 1800  BP:  (!) 153/90 (!) 150/89 (!) 148/90  Pulse:  81 81 78  Resp:  19 (!) 26 (!) 22  Temp:      TempSrc:      SpO2: 92% 98% 100% 97%   Physical Exam Vitals and nursing note reviewed.  Constitutional:      General: She is not in acute distress.    Appearance: She is  normal weight. She is not toxic-appearing.  HENT:     Head: Normocephalic and atraumatic.  Eyes:     Extraocular Movements: Extraocular movements intact.     Pupils: Pupils are equal, round, and reactive to light.  Neck:     Vascular: JVD (mild) present.  Cardiovascular:     Rate and Rhythm: Normal rate and regular rhythm.  Pulmonary:     Effort: Pulmonary effort is normal. Tachypnea present.     Breath sounds: Rales (Diffuse Rales heard on both anterior and posterior exam throughout) present. No decreased breath sounds, wheezing or rhonchi.  Abdominal:     General: There is distension (mild).     Palpations: Abdomen is soft.     Tenderness: There is no abdominal tenderness. There is no guarding.  Musculoskeletal:     Cervical back: Neck supple.     Right lower leg: No edema.     Left lower leg: No edema.     Comments: Bilateral compression stockings on  Neurological:     General: No focal deficit present.     Mental Status: She is alert and oriented to person, place, and time.  Psychiatric:        Mood and Affect: Mood normal.        Behavior: Behavior normal.    Data Reviewed: CBC with WBC of 9.3, hemoglobin 11.6, MCV 97, platelets of 298 CMP with sodium of 133, potassium 3.9, bicarb 25, glucose 120, BUN 14, creatinine 0.73, AST 29, ALT 23 and GFR above 60 BNP elevated at 1168 Troponin negative x 2 at 11 and 11 Procalcitonin negative at less than 0.10 Influenza, RSV and COVID-19 PCR negative  EKG personally reviewed.  Atrial and ventricular pacing.  DG Chest 2 View  Result Date: 02/04/2023 CLINICAL DATA:  Shortness of breath EXAM: CHEST - 2 VIEW COMPARISON:  November 08, 2022 FINDINGS: The cardiomediastinal silhouette is stable. No pneumothorax. No nodules or masses. Stable pacemaker. Coarsened  lung markings and patchy opacities bilaterally. No other acute abnormalities are identified. IMPRESSION: Patchy bilateral pulmonary opacities are favored to represent an infectious  process. Asymmetric pulmonary edema is another possibility. No other acute abnormalities. Electronically Signed   By: Gerome Sam III M.D.   On: 02/04/2023 14:44    Results are pending, will review when available.  Assessment and Plan:  * Acute on chronic HFrEF (heart failure with reduced ejection fraction) (HCC) Patient is presenting with acute hypoxic respiratory failure in the setting of acute HFrEF exacerbation.  Echocardiogram in April 2024 demonstrated EF of 40-45% with global hypokinesis.  Stress test obtained yesterday demonstrated  - Telemetry monitoring - Continue supplemental oxygen to maintain oxygen saturation above 90% - Wean as tolerated - Start Lasix 60 mg IV daily - Strict in and out - Daily weights - Would benefit from initiation of GDMT prior to discharge, including changing home Lopressor to Toprol - Continue outpatient follow-up with cardiology  Bronchiectasis without complication (HCC) Presenting with shortness of breath and hypoxia, however secondary to heart failure.  Low suspicion this is related to patient's history of bronchiectasis.  Hypothyroid - Continue home levothyroxine  Essential hypertension - Continue home antihypertensives  Atrial fibrillation (HCC) - Continue home amiodarone, Eliquis and metoprolol  Obstructive sleep apnea - CPAP at bedtime  Advance Care Planning:   Code Status: DNR patient states that under no circumstances would she want CPR or to be shocked for cardiac arrest, however she is amenable to a short resuscitative effort in the case of pulmonary only arrest.  She would not want to be on a melena later for more than a few days.  She states if she would need to be on for more than a few days, she would want to be transition to comfort measures.  Her daughter Gavin Pound at bedside present for discussion.  Consults: None  Family Communication: [name] at bedside updated  Severity of Illness: The appropriate patient  status for this patient is INPATIENT. Inpatient status is judged to be reasonable and necessary in order to provide the required intensity of service to ensure the patient's safety. The patient's presenting symptoms, physical exam findings, and initial radiographic and laboratory data in the context of their chronic comorbidities is felt to place them at high risk for further clinical deterioration. Furthermore, it is not anticipated that the patient will be medically stable for discharge from the hospital within 2 midnights of admission.   * I certify that at the point of admission it is my clinical judgment that the patient will require inpatient hospital care spanning beyond 2 midnights from the point of admission due to high intensity of service, high risk for further deterioration and high frequency of surveillance required.*  Author: Verdene Lennert, MD 02/04/2023 7:55 PM  For on call review [email or web address].

## 2023-02-04 NOTE — Assessment & Plan Note (Addendum)
Patient is presenting with acute hypoxic respiratory failure in the setting of acute HFrEF exacerbation.  Echocardiogram in April 2024 demonstrated EF of 40-45% with global hypokinesis.  Stress test obtained yesterday demonstrated  - Telemetry monitoring - Continue supplemental oxygen to maintain oxygen saturation above 90% - Wean as tolerated - Start Lasix 60 mg IV daily - Strict in and out - Daily weights - Would benefit from initiation of GDMT prior to discharge, including changing home Lopressor to Toprol - Continue outpatient follow-up with cardiology

## 2023-02-04 NOTE — ED Notes (Signed)
Pt taken by xray at this time 

## 2023-02-04 NOTE — Assessment & Plan Note (Signed)
-   CPAP at bedtime 

## 2023-02-04 NOTE — ED Triage Notes (Addendum)
Pt was here for stress test yesterday, c/o SHOB and oxygen levels in the 70's today. Pt has oxygen in the 80's recently. Pt c/o weakness and feeling cold. Pt does not use oxygen at home. Pt AOX4, NAD noted. Respirations even and unlabored, DOE, no cough noted/reported.

## 2023-02-04 NOTE — Assessment & Plan Note (Signed)
Continue home levothyroxine 

## 2023-02-04 NOTE — ED Notes (Signed)
Admitting MD into room.  

## 2023-02-04 NOTE — Assessment & Plan Note (Signed)
-   Continue home amiodarone, Eliquis and metoprolol

## 2023-02-04 NOTE — ED Provider Notes (Signed)
[facility] Provider Note    Event Date/Time   First MD Initiated Contact with Patient 02/04/23 1654     (approximate)   History   Shortness of Breath   HPI  Kerri Mills is a 89 y.o. female with recent history of congestive heart failure not on oxygen presents to the ER for worsening dyspnea on exertion, orthopnea and dyspnea at rest with home O2 readings been in the 70s at the lowest but frequently dipping down to the 80s.  She has been increasing her Lasix at home without much improvement.  Denies any wheezing no productive cough.  Was admitted for flu pneumonia in January.  Denies any chest pain no abdominal pain.     Physical Exam   Triage Vital Signs: ED Triage Vitals [02/04/23 1332]  Enc Vitals Group     BP (!) 144/78     Pulse Rate 81     Resp 19     Temp 98 F (36.7 C)     Temp Source Oral     SpO2 (!) 78 %     Weight      Height      Head Circumference      Peak Flow      Pain Score 0     Pain Loc      Pain Edu?      Excl. in GC?     Most recent vital signs: Vitals:   02/04/23 1332 02/04/23 1342  BP: (!) 144/78   Pulse: 81   Resp: 19   Temp: 98 F (36.7 C)   SpO2: (!) 78% 92%     Constitutional: Alert  Eyes: Conjunctivae are normal.  Head: Atraumatic. Nose: No congestion/rhinnorhea. Mouth/Throat: Mucous membranes are moist.   Neck: Painless ROM.  Cardiovascular:   Good peripheral circulation. Respiratory: Mild tachypnea but inspiratory crackles throughout..  Gastrointestinal: Soft and nontender.  Musculoskeletal:  no deformity Neurologic:  MAE spontaneously. No gross focal neurologic deficits are appreciated.  Skin:  Skin is warm, dry and intact. No rash noted. Psychiatric: Mood and affect are normal. Speech and behavior are normal.    ED Results / Procedures / Treatments   Labs (all labs ordered are listed, but only abnormal results are displayed) Labs Reviewed  CBC WITH DIFFERENTIAL/PLATELET - Abnormal;  Notable for the following components:      Result Value   RBC 3.75 (*)    Hemoglobin 11.6 (*)    All other components within normal limits  COMPREHENSIVE METABOLIC PANEL - Abnormal; Notable for the following components:   Sodium 133 (*)    Glucose, Bld 120 (*)    Albumin 3.2 (*)    All other components within normal limits  BRAIN NATRIURETIC PEPTIDE - Abnormal; Notable for the following components:   B Natriuretic Peptide 1,168.6 (*)    All other components within normal limits  RESP PANEL BY RT-PCR (RSV, FLU A&B, COVID)  [id number]  PROCALCITONIN  TROPONIN I (HIGH SENSITIVITY)  TROPONIN I (HIGH SENSITIVITY)     EKG  ED ECG REPORT I, Willy Eddy, the attending physician, personally viewed and interpreted this ECG.   Date: 02/04/2023  EKG Time: 13:56  Rate: 80  Rhythm: a/v paced  Axis: left  Intervals: prolonged qt  ST&T Change: paced rhythm    RADIOLOGY Please see ED Course for my review and interpretation.  I personally reviewed all radiographic images ordered to evaluate for the above acute complaints and reviewed radiology reports and  findings.  These findings were personally discussed with the patient.  Please see medical record for radiology report.    PROCEDURES:  Critical Care performed: Yes, see critical care procedure note(s)  .Critical Care  Performed by: Willy Eddy, MD Authorized by: Willy Eddy, MD   Critical care provider statement:    Critical care time (minutes):  35   Critical care was necessary to treat or prevent imminent or life-threatening deterioration of the following conditions:  Cardiac failure   Critical care was time spent personally by me on the following activities:  Ordering and performing treatments and interventions, ordering and review of laboratory studies, ordering and review of radiographic studies, pulse oximetry, re-evaluation of patient's condition, review of old charts, obtaining history from patient or  surrogate, examination of patient, evaluation of patient's response to treatment, discussions with primary provider, discussions with consultants and development of treatment plan with patient or surrogate    MEDICATIONS ORDERED IN ED: Medications  furosemide (LASIX) injection 60 mg (has no administration in time range)     IMPRESSION / MDM / ASSESSMENT AND PLAN / ED COURSE  I reviewed the triage vital signs and the nursing notes.                              Differential diagnosis includes, but is not limited to, Asthma, copd, CHF, pna, ptx, malignancy, Pe, anemia  Patient presenting to the ER for evaluation of symptoms as described above.  Based on symptoms, risk factors and considered above differential, this presenting complaint could reflect a potentially life-threatening illness therefore the patient will be placed on continuous pulse oximetry and telemetry for monitoring.  Laboratory evaluation will be sent to evaluate for the above complaints.  Patient protecting her airway but with evidence of acute respiratory failure with hypoxia requiring supplemental oxygen to maintain normal O2 saturations.  Have a high index of suspicion that this is acute CHF exacerbation.  Lower suspicion for PE given that she is on Eliquis and has been compliant with these meds.  Possible infectious process though no fever or cough.  Do not appreciate any wheezing on exam.   Clinical Course as of 02/04/23 1752  Sat Feb 04, 2023  1657 XR on my review and interpretation with possible mild edema versus patchy infiltrates. [PR]  1743 BNP significantly elevated.  Think today's presentation is most consistent with acute CHF will give dose of IV Lasix.  Given hypoxia will consult hospitalist for admission. [PR]    Clinical Course User Index [PR] Willy Eddy, MD     FINAL CLINICAL IMPRESSION(S) / ED DIAGNOSES   Final diagnoses:  Acute respiratory failure with hypoxia (HCC)     Rx / DC Orders    ED Discharge Orders     None        Note:  This document was prepared using Dragon voice recognition software and may include unintentional dictation errors.    Willy Eddy, MD 02/04/23 [phone]

## 2023-02-04 NOTE — ED Notes (Signed)
Pt alert, NAD, calm, interactive, speaking in clear complete sentences. Family at [facility].

## 2023-02-05 ENCOUNTER — Encounter: Payer: Self-pay | Admitting: Internal Medicine

## 2023-02-05 DIAGNOSIS — I5023 Acute on chronic systolic (congestive) heart failure: Secondary | ICD-10-CM | POA: Diagnosis not present

## 2023-02-05 LAB — CBC WITH DIFFERENTIAL/PLATELET
Abs Immature Granulocytes: 0.03 10*3/uL (ref 0.00–0.07)
Basophils Absolute: 0 10*3/uL (ref 0.0–0.1)
Basophils Relative: 1 %
Eosinophils Absolute: 0.2 10*3/uL (ref 0.0–0.5)
Eosinophils Relative: 2 %
HCT: 35 % — ABNORMAL LOW (ref 36.0–46.0)
Hemoglobin: 10.6 g/dL — ABNORMAL LOW (ref 12.0–15.0)
Immature Granulocytes: 0 %
Lymphocytes Relative: 16 %
Lymphs Abs: 1.2 10*3/uL (ref 0.7–4.0)
MCH: 30.2 pg (ref 26.0–34.0)
MCHC: 30.3 g/dL (ref 30.0–36.0)
MCV: 99.7 fL (ref 80.0–100.0)
Monocytes Absolute: 0.9 10*3/uL (ref 0.1–1.0)
Monocytes Relative: 11 %
Neutro Abs: 5.3 10*3/uL (ref 1.7–7.7)
Neutrophils Relative %: 70 %
Platelets: 285 10*3/uL (ref 150–400)
RBC: 3.51 MIL/uL — ABNORMAL LOW (ref 3.87–5.11)
RDW: 12.5 % (ref 11.5–15.5)
WBC: 7.6 10*3/uL (ref 4.0–10.5)
nRBC: 0 % (ref 0.0–0.2)

## 2023-02-05 LAB — BASIC METABOLIC PANEL
Anion gap: 7 (ref 5–15)
BUN: 12 mg/dL (ref 8–23)
CO2: 28 mmol/L (ref 22–32)
Calcium: 8.7 mg/dL — ABNORMAL LOW (ref 8.9–10.3)
Chloride: 99 mmol/L (ref 98–111)
Creatinine, Ser: 0.9 mg/dL (ref 0.44–1.00)
GFR, Estimated: >60 mL/min (ref >60)
Glucose, Bld: 119 mg/dL — ABNORMAL HIGH (ref 70–99)
Potassium: 3.3 mmol/L — ABNORMAL LOW (ref 3.5–5.1)
Sodium: 134 mmol/L — ABNORMAL LOW (ref 135–145)

## 2023-02-05 LAB — MAGNESIUM: Magnesium: 1.9 mg/dL (ref 1.7–2.4)

## 2023-02-05 MED ORDER — POTASSIUM CHLORIDE CRYS ER 20 MEQ PO TBCR
40.0000 meq | EXTENDED_RELEASE_TABLET | Freq: Once | ORAL | Status: AC
  Administered 2023-02-05: 40 meq via ORAL
  Filled 2023-02-05: qty 2

## 2023-02-05 NOTE — Evaluation (Signed)
Physical Therapy Evaluation Patient Details Name: Kerri Mills MRN: [id number] DOB: [birth year] Today's Date: 02/05/2023  History of Present Illness  Pt admitted to [facility] on 02/04/23 for c/o worsening DOE, orthopnea, and dyspnea at rest with home O2 reading in 70's to 80's. Recently admitted to the hospital for flu/PNA in January. Significant PMH includes: newly diagnosed HFrEF with Ef of 40%, tachy-brady syndrome s/p PPM, HTN, Atrial fibrillation on Eliquis, chronic BLE edema, OSA.   Clinical Impression  Pt is a 89 year old F admitted to hospital on 02/04/23 for acute on chronic HFrEF. At baseline, pt was requiring assist with ADL's and IADL's from daughter due to recent RUE fx and hospital admission. She had just graduated to ambulation without AD; was previously using pRW and then RW.   Pt presents with decreased activity tolerance, increased O2 dependence from baseline, labored breathing, and decreased standing balance, resulting in impaired functional mobility from baseline. Due to deficits, pt required CGA for bed mobility, CGA for transfers with RW, and CGA to ambulate multiple short bouts in room with RW. Decreased activity tolerance noted by RPE of 4-6/10 indicating "moderate activity" after in-room ambulation. O2 desaturation twice during session, but pt able to recover with rest and cues for breathing; see general sections comment below for details.  Deficits limit the pt's ability to safely and independently perform ADL's, transfer, and ambulate. Pt will benefit from acute skilled PT services to address deficits for return to baseline function. At this time, PT recommends continued therapy services at home with assistance from daughter. Pt will benefit from use of RW at DC for safety and energy conservation. Pt likely to require supplemental O2 due to current presentation and new HFrEF diagnosis.        Recommendations for follow up therapy are one component of a multi-disciplinary discharge  planning process, led by the attending physician.  Recommendations may be updated based on patient status, additional functional criteria and insurance authorization.  Follow Up Recommendations       Assistance Recommended at Discharge Intermittent Supervision/Assistance     Equipment Recommendations  (use of RW at home for energy conservation)     Functional Status Assessment Patient has had a recent decline in their functional status and demonstrates the ability to make significant improvements in function in a reasonable and predictable amount of time.     Precautions / Restrictions Precautions Precautions: Fall Precaution Comments: Strict I/O; SpO2 >/= 94% Restrictions Weight Bearing Restrictions: No      Mobility  Bed Mobility               General bed mobility comments: CGA to sit EOB, HOB elevated, use of BUE for support; increased time/effort    Transfers                   General transfer comment: CGA for safety to stand/sit from EOB and low height commode with RW. Verbal cues for hand placement and sequencing.    Ambulation/Gait   Gait Distance (Feet): 45 Feet (13ft x1; 29ft x2 (EOB<>commode); 23ft x1 (backwards))           General Gait Details: CGA for safety to ambulate multiple short bouts in room with RW. Demonstrates slowed cadence, narrow BOS, decreased step length/foot clearance bil, and labored breathing.      Balance Overall balance assessment: Needs assistance   Sitting balance-Leahy Scale: Normal     Standing balance support: Bilateral upper extremity supported, During functional activity Standing balance-Leahy  Scale: Good Standing balance comment: BUE support on RW                             Pertinent Vitals/Pain Pain Assessment Pain Assessment: Faces Faces Pain Scale: Hurts a little bit Pain Location: "soreness" in L forearm Pain Descriptors / Indicators: Sore Pain Intervention(s): Monitored during session,  Repositioned    Home Living Family/patient expects to be discharged to:: Private residence (Currently at [facility], but daughter is moving her to [facility] (senior living center))   Available Help at Discharge: Family;Available 24 hours/day Type of Home: Independent living facility         Home Layout: One level Home Equipment: Rollator (4 wheels);Shower seat;Other (comment);BSC/3in1;Rolling Walker (2 wheels)      Prior Function               Mobility Comments: Just graduated to using no AD for ambulation at home. Was previously using pRW and then RW since admission in March. ADLs Comments: Daughter was assisting with ADL's/IADL's since admission in March secondary to LUE restrictions     Hand Dominance   Dominant Hand: Right    Extremity/Trunk Assessment   Upper Extremity Assessment Upper Extremity Assessment: Defer to OT evaluation    Lower Extremity Assessment Lower Extremity Assessment: Overall WFL for tasks assessed       Communication   Communication: No difficulties  Cognition Arousal/Alertness: Awake/alert Behavior During Therapy: WFL for tasks assessed/performed Overall Cognitive Status: Within Functional Limits for tasks assessed                                 General Comments: A&Ox 4        General Comments General comments (skin integrity, edema, etc.): Pt on 2L O2 via [place] for entirety of session. O2 desaturation to 88% with initial stand, able to recover <30s with cues for pursed lip breathing. O2 desaturation after ~61ft ambulation to 86%. Able to recover in about with seated rest break and cues for pursed lip breathing. RPE of 4-6/10 indicating "moderate activity". Elevated RR in low 30's after gait. HR and BP WNL.    Exercises Other Exercises Other Exercises: Participates in bed mobility, transfers, and multiple short bouts of gait with RW. Other Exercises: Pt educated re: PT role/POC, DC recommendations, rehab  prognosis, pursed lip breathing, pacing, DME for energy conservation, and activity modification.   Assessment/Plan    PT Assessment Patient needs continued PT services  PT Problem List Decreased activity tolerance;Decreased balance;Decreased mobility;Cardiopulmonary status limiting activity       PT Treatment Interventions DME instruction;Gait training;Functional mobility training;Therapeutic activities;Therapeutic exercise;Balance training;Neuromuscular re-education;Patient/family education    PT Goals (Current goals can be found in the Care Plan section)  Acute Rehab PT Goals Patient Stated Goal: "get therapy for RUE" PT Goal Formulation: With patient Time For Goal Achievement: 02/26/23 Potential to Achieve Goals: Good    Frequency Min 2X/week     Co-evaluation PT/OT/SLP Co-Evaluation/Treatment: Yes Reason for Co-Treatment: For patient/therapist safety;To address functional/ADL transfers PT goals addressed during session: Mobility/safety with mobility;Balance OT goals addressed during session: ADL's and self-care       AM-PAC PT "6 Clicks" Mobility  Outcome Measure Help needed turning from your back to your side while in a flat bed without using bedrails?: A Little Help needed moving from lying on your back to sitting on  the side of a flat bed without using bedrails?: A Little Help needed moving to and from a bed to a chair (including a wheelchair)?: A Little Help needed standing up from a chair using your arms (e.g., wheelchair or bedside chair)?: A Little Help needed to walk in hospital room?: A Little Help needed climbing 3-5 steps with a railing? : A Little 6 Click Score: 18    End of Session Equipment Utilized During Treatment: Gait belt;Oxygen (2L) Activity Tolerance: Patient tolerated treatment well;Patient limited by fatigue Patient left:  (left in care of OT on commode) Nurse Communication: Mobility status PT Visit Diagnosis: Unsteadiness on feet  (R26.81);Muscle weakness (generalized) (M62.81)    Time: 1610-9604 PT Time Calculation (min) (ACUTE ONLY): 36 min   Charges:   PT Evaluation $PT Eval Low Complexity: 1 Low PT Treatments $Therapeutic Activity: 8-22 mins        Vira Blanco, PT, DPT 10:39 AM,02/05/23 Physical Therapist - [facility]

## 2023-02-05 NOTE — Progress Notes (Addendum)
Progress Note    Kerri Mills  HGD:[id number] DOB: 10/30/1930  DOA: 02/04/2023 PCP: Dale [place], MD      Brief Narrative:    Medical records reviewed and are as summarized below:  Kerri Mills is a 89 y.o. female with medical history significant for chronic systolic CHF with EF of 40%, tachy-brady syndrome s/p permanent pacemaker, hypertension, atrial fibrillation on Eliquis, OSA, who presented to the hospital with productive cough, shortness of breath and bilateral leg swelling.Marland Kitchen    She was admitted to the hospital for acute exacerbation of chronic systolic CHF.   Assessment/Plan:   Principal Problem:   Acute on chronic HFrEF (heart failure with reduced ejection fraction) (HCC) Active Problems:   Acute respiratory failure with hypoxia (HCC)   Obstructive sleep apnea   Atrial fibrillation (HCC)   Essential hypertension   Hypothyroid   Bronchiectasis without complication (HCC)    Acute on chronic systolic CHF: Continue IV Lasix.  Monitor BMP, daily weight and urine output. 2D echo in April 2024 showed EF estimated at 40 to 45%, moderate LVH, indeterminate LV diastolic parameters, mild to moderate TR.   Acute hypoxic respiratory failure: Oxygen saturation dropped to 86 to 88% on room air while working with occupational therapist.  Continue 2 L/min oxygen via [place].  Wean off oxygen as able.   Hypokalemia: Replete potassium and monitor levels   Paroxysmal atrial fibrillation: Continue amiodarone, metoprolol and Eliquis   Recent nuclear stress test on 02/03/2023 did not show any evidence of reversible ischemia. Recent pacemaker check on 01/27/2023 showed normal device function.   Other comorbidities include bronchiectasis, hypothyroidism, history of tachy-brady syndrome with permanent pacemaker in place.   Diet Order             Diet Heart Room service appropriate? Yes; Fluid consistency: Thin  Diet effective now                             Consultants: None  Procedures: None    Medications:    amiodarone  200 mg Oral Daily   apixaban  5 mg Oral BID   DULoxetine  60 mg Oral QHS   furosemide  60 mg Intravenous Daily   gabapentin  600 mg Oral QHS   levothyroxine  125 mcg Oral Q0600   metoprolol tartrate  75 mg Oral BID   pantoprazole  40 mg Oral Daily   pravastatin  40 mg Oral QHS   sodium chloride flush  3 mL Intravenous Q12H   Continuous Infusions:  sodium chloride       Anti-infectives (From admission, onward)    None              Family Communication/Anticipated D/C date and plan/Code Status   DVT prophylaxis:  apixaban (ELIQUIS) tablet 5 mg     Code Status: DNR  Family Communication: None Disposition Plan: Plan to discharge home in 2 to 3 days   Status is: Inpatient Remains inpatient appropriate because: CHF exacerbation       Subjective:   Interval events noted.  She complains of shortness of breath although dizziness improved.  Leg swelling has improved as well.  No chest pain.  Occupational therapists were at the bedside.  Objective:    Vitals:   02/05/23 0900 02/05/23 0930 02/05/23 1030 02/05/23 1230  BP: (!) 112/99 106/87 114/70 101/76  Pulse: 78 84 81 80  Resp: (!) 23  Temp:      TempSrc:      SpO2: 96% 93% 95% 94%   No data found.  No intake or output data in the 24 hours ending 02/05/23 1250 There were no vitals filed for this visit.  Exam:  GEN: NAD SKIN: Warm and dry EYES: EOMI ENT: MMM CV: RRR PULM: Bibasilar rales, no wheezing ABD: soft, ND, NT, +BS CNS: AAO x 3, non focal EXT: No edema or tenderness        Data Reviewed:   I have personally reviewed following labs and imaging studies:  Labs: Labs show the following:   Basic Metabolic Panel: Recent Labs  Lab 02/04/23 1348 02/05/23 0534  NA 133* 134*  K 3.9 3.3*  CL 101 99  CO2 25 28  GLUCOSE 120* 119*  BUN 14 12  CREATININE 0.73 0.90  CALCIUM 8.9  8.7*  MG  --  1.9   GFR Estimated Creatinine Clearance: 44.4 mL/min (by C-G formula based on SCr of 0.9 mg/dL). Liver Function Tests: Recent Labs  Lab 02/04/23 1348  AST 29  ALT 23  ALKPHOS 114  BILITOT 1.0  PROT 6.9  ALBUMIN 3.2*   No results for input(s): "LIPASE", "AMYLASE" in the last 168 hours. No results for input(s): "AMMONIA" in the last 168 hours. Coagulation profile No results for input(s): "INR", "PROTIME" in the last 168 hours.  CBC: Recent Labs  Lab 02/04/23 1348 02/05/23 0534  WBC 9.3 7.6  NEUTROABS 7.1 5.3  HGB 11.6* 10.6*  HCT 36.6 35.0*  MCV 97.6 99.7  PLT 298 285   Cardiac Enzymes: No results for input(s): "CKTOTAL", "CKMB", "CKMBINDEX", "TROPONINI" in the last 168 hours. BNP (last 3 results) Recent Labs    11/15/22 1301  PROBNP 11,648*   CBG: No results for input(s): "GLUCAP" in the last 168 hours. D-Dimer: No results for input(s): "DDIMER" in the last 72 hours. Hgb A1c: No results for input(s): "HGBA1C" in the last 72 hours. Lipid Profile: No results for input(s): "CHOL", "HDL", "LDLCALC", "TRIG", "CHOLHDL", "LDLDIRECT" in the last 72 hours. Thyroid function studies: No results for input(s): "TSH", "T4TOTAL", "T3FREE", "THYROIDAB" in the last 72 hours.  Invalid input(s): "FREET3" Anemia work up: No results for input(s): "VITAMINB12", "FOLATE", "FERRITIN", "TIBC", "IRON", "RETICCTPCT" in the last 72 hours. Sepsis Labs: Recent Labs  Lab 02/04/23 1348 02/05/23 0534  PROCALCITON <0.10  --   WBC 9.3 7.6    Microbiology Recent Results (from the past 240 hour(s))  Resp panel by RT-PCR (RSV, Flu A&B, Covid) Anterior Nasal Swab     Status: None   Collection Time: 02/04/23  5:03 PM   Specimen: Anterior Nasal Swab  Result Value Ref Range Status   SARS Coronavirus 2 by RT PCR NEGATIVE NEGATIVE Final    Comment: (NOTE) SARS-CoV-2 target nucleic acids are NOT DETECTED.  The SARS-CoV-2 RNA is generally detectable in upper  respiratory specimens during the acute phase of infection. The lowest concentration of SARS-CoV-2 viral copies this assay can detect is 138 copies/mL. A negative result does not preclude SARS-Cov-2 infection and should not be used as the sole basis for treatment or other patient management decisions. A negative result may occur with  improper specimen collection/handling, submission of specimen other than nasopharyngeal swab, presence of viral mutation(s) within the areas targeted by this assay, and inadequate number of viral copies(<138 copies/mL). A negative result must be combined with clinical observations, patient history, and epidemiological information. The expected result is Negative.  Fact Sheet for Patients:  BloggerCourse.com  Fact Sheet for Healthcare Providers:  SeriousBroker.it  This test is no t yet approved or cleared by the [place] FDA and  has been authorized for detection and/or diagnosis of SARS-CoV-2 by FDA under an Emergency Use Authorization (EUA). This EUA will remain  in effect (meaning this test can be used) for the duration of the COVID-19 declaration under Section 564(b)(1) of the Act, 21 U.S.C.section 360bbb-3(b)(1), unless the authorization is terminated  or revoked sooner.       Influenza A by PCR NEGATIVE NEGATIVE Final   Influenza B by PCR NEGATIVE NEGATIVE Final    Comment: (NOTE) The Xpert Xpress SARS-CoV-2/FLU/RSV plus assay is intended as an aid in the diagnosis of influenza from Nasopharyngeal swab specimens and should not be used as a sole basis for treatment. Nasal washings and aspirates are unacceptable for Xpert Xpress SARS-CoV-2/FLU/RSV testing.  Fact Sheet for Patients: BloggerCourse.com  Fact Sheet for Healthcare Providers: SeriousBroker.it  This test is not yet approved or cleared by the [place] FDA and has been  authorized for detection and/or diagnosis of SARS-CoV-2 by FDA under an Emergency Use Authorization (EUA). This EUA will remain in effect (meaning this test can be used) for the duration of the COVID-19 declaration under Section 564(b)(1) of the Act, 21 U.S.C. section 360bbb-3(b)(1), unless the authorization is terminated or revoked.     Resp Syncytial Virus by PCR NEGATIVE NEGATIVE Final    Comment: (NOTE) Fact Sheet for Patients: BloggerCourse.com  Fact Sheet for Healthcare Providers: SeriousBroker.it  This test is not yet approved or cleared by the [place] FDA and has been authorized for detection and/or diagnosis of SARS-CoV-2 by FDA under an Emergency Use Authorization (EUA). This EUA will remain in effect (meaning this test can be used) for the duration of the COVID-19 declaration under Section 564(b)(1) of the Act, 21 U.S.C. section 360bbb-3(b)(1), unless the authorization is terminated or revoked.  Performed at [facility], [address], [place], [place] [address]     Procedures and diagnostic studies:  DG Chest 2 View  Result Date: 02/04/2023 CLINICAL DATA:  Shortness of breath EXAM: CHEST - 2 VIEW COMPARISON:  November 08, 2022 FINDINGS: The cardiomediastinal silhouette is stable. No pneumothorax. No nodules or masses. Stable pacemaker. Coarsened lung markings and patchy opacities bilaterally. No other acute abnormalities are identified. IMPRESSION: Patchy bilateral pulmonary opacities are favored to represent an infectious process. Asymmetric pulmonary edema is another possibility. No other acute abnormalities. Electronically Signed   By: Gerome Sam III M.D.   On: 02/04/2023 14:44               LOS: 1 day   Alexus Michael  [facility]   Pager on [email or web address]. If 7PM-7AM, please contact night-coverage at [email or web address]     02/05/2023, 12:50 PM

## 2023-02-05 NOTE — ED Notes (Signed)
Pt sating well on 2 L [place]. RN titrated down to 1 L [place] and will monitor her progress.

## 2023-02-05 NOTE — ED Notes (Signed)
Pt up to sit on commode with steady gait independently. Pt requesting items to brush her teeth and do hygiene. I offered to assist patient, but she assured me that she could complete these ADLs independently. Pt tolerated activity without dypsnea or CP.

## 2023-02-05 NOTE — ED Notes (Signed)
PT/OT at bedside.

## 2023-02-05 NOTE — Evaluation (Signed)
Occupational Therapy Evaluation Patient Details Name: Kerri Mills MRN: [id number] DOB: 11-21-1930 Today's Date: 02/05/2023   History of Present Illness Pt admitted to [facility] on 02/04/23 for c/o worsening DOE, orthopnea, and dyspnea at rest with home O2 reading in 70's to 80's. Recently admitted to the hospital for flu/PNA in January. Significant PMH includes: newly diagnosed HFrEF with Ef of 40%, tachy-brady syndrome s/p PPM, HTN, Atrial fibrillation on Eliquis, chronic BLE edema, OSA.   Clinical Impression   Patient agreeable to OT/PT co-treatment to maximize safety and participation. Pt presenting with decreased independence in self care, balance, functional mobility/transfers, and endurance. Pt from [facility] IL[facility], but was living with daughter after recent admission for R distal radius fx due to requiring increased assistance with ADLs/IADLs. Pt currently functioning at CGA-Min A for bed mobility, CGA for toilet transfer, CGA-Min A for toileting hygiene/clothing management, and CGA for functional mobility to/from toilet using a RW. Pt on 2L O2 via [place] t/o session. O2 desatting to 86-88% with activity, however, pt recovered quickly with seated rest breaks and PLB. Pt will benefit from skilled acute OT services to address deficits noted below. OT recommends ongoing therapy upon discharge to maximize safety and independence with ADLs, decrease fall risk, decrease caregiver burden, and promote return to PLOF.       Recommendations for follow up therapy are one component of a multi-disciplinary discharge planning process, led by the attending physician.  Recommendations may be updated based on patient status, additional functional criteria and insurance authorization.   Assistance Recommended at Discharge Intermittent Supervision/Assistance  Patient can return home with the following A little help with walking and/or transfers;A little help with bathing/dressing/bathroom;Assistance with  cooking/housework;Assist for transportation;Help with stairs or ramp for entrance    Functional Status Assessment  Patient has had a recent decline in their functional status and demonstrates the ability to make significant improvements in function in a reasonable and predictable amount of time.  Equipment Recommendations  None recommended by OT    Recommendations for Other Services       Precautions / Restrictions Precautions Precautions: Fall Precaution Comments: Strict I/O; SpO2 >/= 94% Restrictions Weight Bearing Restrictions: No      Mobility Bed Mobility Overal bed mobility: Needs Assistance Bed Mobility: Supine to Sit, Sit to Supine     Supine to sit: Min guard, HOB elevated Sit to supine: Min assist (for BLE management)        Transfers Overall transfer level: Needs assistance Equipment used: Rolling walker (2 wheels) Transfers: Sit to/from Stand Sit to Stand: Min guard           General transfer comment: STS from EOB and toilet, VC for hand placement and sequencing      Balance Overall balance assessment: Needs assistance Sitting-balance support: Feet supported Sitting balance-Leahy Scale: Good     Standing balance support: Bilateral upper extremity supported, During functional activity, Single extremity supported Standing balance-Leahy Scale: Good     ADL either performed or assessed with clinical judgement   ADL Overall ADL's : Needs assistance/impaired       Toilet Transfer: Min guard;Regular Toilet;Grab bars;Ambulation;Rolling walker (2 wheels) Toilet Transfer Details (indicate cue type and reason): Min guard for controlled descent 2/2 low surface Toileting- Clothing Manipulation and Hygiene: Min guard;Minimal assistance;Sit to/from stand Toileting - Clothing Manipulation Details (indicate cue type and reason): Min guard for safety during posterior hygiene in standing, CGA-Min A for clothing management of gown in standing     Functional  mobility during ADLs: Min guard;Rolling walker (2 wheels) (28 ft at room level then 58ft x2 to the bathroom)       Vision Baseline Vision/History: 1 Wears glasses Patient Visual Report: No change from baseline       Perception     Praxis      Pertinent Vitals/Pain Pain Assessment Pain Assessment: Faces Faces Pain Scale: Hurts a little bit Pain Location: RUE Pain Descriptors / Indicators: Sore Pain Intervention(s): Monitored during session, Repositioned     Hand Dominance Right   Extremity/Trunk Assessment Upper Extremity Assessment Upper Extremity Assessment: Generalized weakness;RUE deficits/detail RUE Deficits / Details: Previous ORIF of R distal radius fx on 12/06/22.   Lower Extremity Assessment Lower Extremity Assessment: Generalized weakness       Communication Communication Communication: No difficulties   Cognition Arousal/Alertness: Awake/alert Behavior During Therapy: WFL for tasks assessed/performed Overall Cognitive Status: Within Functional Limits for tasks assessed           General Comments  Pt on 2L O2 via [place] t/o session. SpO2 94% at rest. Pt desatting to 86-88% with activity. Recovered quickly with seated rest break and PLB.    Exercises Other Exercises Other Exercises: OT provided education re: role of OT, OT POC, post acute recs, sitting up for all meals, EOB/OOB mobility with assistance, home/fall safety, energy conservation techniques (rest breaks, PLB)   Shoulder Instructions      Home Living Family/patient expects to be discharged to:: Private residence (Currently at [facility] IL[facility], but daughter is moving her to [facility] ILF (senior living center)) Living Arrangements: Alone Available Help at Discharge: Family;Available 24 hours/day (daughter) Type of Home: Independent living facility Home Access: Level entry     Home Layout: One level     Bathroom Shower/Tub: Producer, television/film/video: Handicapped height Bathroom  Accessibility: Yes   Home Equipment: Rollator (4 wheels);Shower seat;BSC/3in1;Rolling Environmental consultant (2 wheels);Other (comment) (R platform walker)          Prior Functioning/Environment Prior Level of Function : Independent/Modified Independent             Mobility Comments: Just graduated to using no AD for ambulation at home with HHPT. Was previously using R platform walker since admission in March. ADLs Comments: Prior to fall in March, pt was MOD I in ADL/IADL. Daughter was assisting with ADLs/IADLs since admission in March secondary to RUE restrictions. Has not driven since January.        OT Problem List: Decreased strength;Decreased activity tolerance;Impaired balance (sitting and/or standing);Pain;Cardiopulmonary status limiting activity;Increased edema      OT Treatment/Interventions: Self-care/ADL training;Therapeutic exercise;Energy conservation;DME and/or AE instruction;Therapeutic activities;Patient/family education;Balance training    OT Goals(Current goals can be found in the care plan section) Acute Rehab OT Goals Patient Stated Goal: return home OT Goal Formulation: With patient Time For Goal Achievement: 02/19/23 Potential to Achieve Goals: Good   OT Frequency: Min 1X/week    Co-evaluation PT/OT/SLP Co-Evaluation/Treatment: Yes Reason for Co-Treatment: For patient/therapist safety;To address functional/ADL transfers PT goals addressed during session: Mobility/safety with mobility;Balance OT goals addressed during session: ADL's and self-care      AM-PAC OT "6 Clicks" Daily Activity     Outcome Measure Help from another person eating meals?: A Little Help from another person taking care of personal grooming?: A Little Help from another person toileting, which includes using toliet, bedpan, or urinal?: A Little Help from another person bathing (including washing, rinsing, drying)?: A Little Help from another person to put on  and taking off regular upper body  clothing?: A Little Help from another person to put on and taking off regular lower body clothing?: A Little 6 Click Score: 18   End of Session Equipment Utilized During Treatment: Gait belt;Rolling walker (2 wheels);Oxygen Nurse Communication: Mobility status  Activity Tolerance: Patient tolerated treatment well;Patient limited by fatigue Patient left: in bed;with call bell/phone within reach  OT Visit Diagnosis: Unsteadiness on feet (R26.81);Muscle weakness (generalized) (M62.81);Pain Pain - Right/Left: Right Pain - part of body: Arm                Time: 0912-1001 OT Time Calculation (min): 49 min Charges:  OT General Charges $OT Visit: 1 Visit OT Evaluation $OT Eval Low Complexity: 1 Low OT Treatments $Self Care/Home Management : 8-22 mins  [facility] MS, OTR/L ascom [phone]  02/05/23, 1:10 PM

## 2023-02-06 ENCOUNTER — Other Ambulatory Visit: Payer: Self-pay | Admitting: Cardiology

## 2023-02-06 DIAGNOSIS — I5023 Acute on chronic systolic (congestive) heart failure: Secondary | ICD-10-CM | POA: Diagnosis not present

## 2023-02-06 DIAGNOSIS — J9601 Acute respiratory failure with hypoxia: Secondary | ICD-10-CM

## 2023-02-06 LAB — BASIC METABOLIC PANEL
Anion gap: 9 (ref 5–15)
BUN: 20 mg/dL (ref 8–23)
CO2: 29 mmol/L (ref 22–32)
Calcium: 9.2 mg/dL (ref 8.9–10.3)
Chloride: 100 mmol/L (ref 98–111)
Creatinine, Ser: 1.03 mg/dL — ABNORMAL HIGH (ref 0.44–1.00)
GFR, Estimated: 52 mL/min — ABNORMAL LOW (ref >60)
Glucose, Bld: 117 mg/dL — ABNORMAL HIGH (ref 70–99)
Potassium: 3.8 mmol/L (ref 3.5–5.1)
Sodium: 138 mmol/L (ref 135–145)

## 2023-02-06 LAB — MAGNESIUM: Magnesium: 1.9 mg/dL (ref 1.7–2.4)

## 2023-02-06 MED ORDER — FUROSEMIDE 40 MG PO TABS
40.0000 mg | ORAL_TABLET | Freq: Every day | ORAL | Status: DC
  Administered 2023-02-06 – 2023-02-07 (×2): 40 mg via ORAL
  Filled 2023-02-06 (×2): qty 1

## 2023-02-06 NOTE — Progress Notes (Signed)
SATURATION QUALIFICATIONS: (This note is used to comply with regulatory documentation for home oxygen)  Patient Saturations on Room Air at Rest = 92%  Patient Saturations on Room Air while Ambulating = 85%  Patient Saturations on 1 Liters of oxygen while Ambulating = 91%  Please briefly explain why patient needs home oxygen: Patient saturation on room air with ambulation is 85%.   Donna Bernard, PT, MPT

## 2023-02-06 NOTE — Progress Notes (Signed)
Physical Therapy Treatment Patient Details Name: Kerri Mills MRN: [id number] DOB: [birth year] Today's Date: 02/06/2023   History of Present Illness Pt admitted to [facility] on 02/04/23 for c/o worsening DOE, orthopnea, and dyspnea at rest with home O2 reading in 70's to 80's. Recently admitted to the hospital for flu/PNA in January. Significant PMH includes: newly diagnosed HFrEF with Ef of 40%, tachy-brady syndrome s/p PPM, HTN, Atrial fibrillation on Eliquis, chronic BLE edema, OSA.    PT Comments    Patient is agreeable to PT. She was requesting to get up for toileting as she has difficulty with using [place]. Min guard provided while patient performed multiple standing bouts. Patient fatigued with activity and required intermittent rest breaks. Pulse oximetry assessed during ambulation. Patient walked in the hallway with rolling walker with Min guard assistance. Sp02 decreased to 85% on room air, and increased to 91% with 1 L02 with ambulation. Cues for breathing techniques provided. Recommend to continue PT to maximize independence and facilitate return to prior level of function.    Recommendations for follow up therapy are one component of a multi-disciplinary discharge planning process, led by the attending physician.  Recommendations may be updated based on patient status, additional functional criteria and insurance authorization.  Follow Up Recommendations       Assistance Recommended at Discharge Intermittent Supervision/Assistance  Patient can return home with the following Assist for transportation;Help with stairs or ramp for entrance   Equipment Recommendations  None recommended by PT    Recommendations for Other Services       Precautions / Restrictions Precautions Precautions: Fall Restrictions Weight Bearing Restrictions: No     Mobility  Bed Mobility Overal bed mobility: Needs Assistance Bed Mobility: Supine to Sit     Supine to sit: Min assist     General bed  mobility comments: increased time required.occasional assistance for trunk support    Transfers Overall transfer level: Needs assistance Equipment used: Rolling walker (2 wheels) Transfers: Sit to/from Stand Sit to Stand: Min guard           General transfer comment: several standing bouts performed.    Ambulation/Gait Ambulation/Gait assistance: Min guard, Supervision Gait Distance (Feet): 120 Feet Assistive device: Rolling walker (2 wheels) Gait Pattern/deviations: Step-through pattern Gait velocity: decreased     General Gait Details: slow but steady with cues for breathing techniques with ambulation. pulse ox checked while ambulating (85% on room air, increasing to 91% on 1 L02)   Stairs             Wheelchair Mobility    Modified Rankin (Stroke Patients Only)       Balance Overall balance assessment: Needs assistance Sitting-balance support: Feet supported Sitting balance-Leahy Scale: Good     Standing balance support: Bilateral upper extremity supported Standing balance-Leahy Scale: Good                              Cognition Arousal/Alertness: Awake/alert Behavior During Therapy: WFL for tasks assessed/performed Overall Cognitive Status: Within Functional Limits for tasks assessed                                          Exercises      General Comments General comments (skin integrity, edema, etc.): frequent rest breaks required between bouts of activity. encouraged pursed lip breathing techniques. patient was  able to urinate on bed side commode on arrival to room. she requested to leave [place] off at end of session. encouraged patient to call for assistance to get back to bed for safety with call bell in reach.      Pertinent Vitals/Pain Pain Assessment Pain Assessment: No/denies pain    Home Living                          Prior Function            PT Goals (current goals can now be found in  the care plan section) Acute Rehab PT Goals Patient Stated Goal: to return home PT Goal Formulation: With patient Time For Goal Achievement: 02/26/23 Potential to Achieve Goals: Good Progress towards PT goals: Progressing toward goals    Frequency    Min 2X/week      PT Plan Current plan remains appropriate    Co-evaluation              AM-PAC PT "6 Clicks" Mobility   Outcome Measure  Help needed turning from your back to your side while in a flat bed without using bedrails?: A Little Help needed moving from lying on your back to sitting on the side of a flat bed without using bedrails?: A Little Help needed moving to and from a bed to a chair (including a wheelchair)?: A Little Help needed standing up from a chair using your arms (e.g., wheelchair or bedside chair)?: A Little Help needed to walk in hospital room?: A Little Help needed climbing 3-5 steps with a railing? : A Little 6 Click Score: 18    End of Session Equipment Utilized During Treatment: Gait belt;Oxygen Activity Tolerance: Patient tolerated treatment well Patient left: in chair;with call bell/phone within reach Nurse Communication: Mobility status PT Visit Diagnosis: Unsteadiness on feet (R26.81);Muscle weakness (generalized) (M62.81)     Time: 1420-1510 PT Time Calculation (min) (ACUTE ONLY): 50 min  Charges:  $Therapeutic Activity: 38-52 mins                    Donna Bernard, PT, MPT    Ina Homes 02/06/2023, 3:22 PM

## 2023-02-06 NOTE — ED Notes (Signed)
RN checked purwick and in position. Pt remains dry with no output through out the night.

## 2023-02-06 NOTE — Progress Notes (Signed)
Progress Note    Kerri Mills  WUJ:[id number] DOB: Mar 14, 1931  DOA: 02/04/2023 PCP: Dale Felton, MD      Brief Narrative:    Medical records reviewed and are as summarized below:  Kerri Mills is a 89 y.o. female with medical history significant for chronic systolic CHF with EF of 40%, tachy-brady syndrome s/p permanent pacemaker, hypertension, atrial fibrillation on Eliquis, OSA, who presented to the hospital with productive cough, shortness of breath and bilateral leg swelling.Marland Kitchen    She was admitted to the hospital for acute exacerbation of chronic systolic CHF.   Assessment/Plan:   Principal Problem:   Acute on chronic HFrEF (heart failure with reduced ejection fraction) (HCC) Active Problems:   Acute respiratory failure with hypoxia (HCC)   Obstructive sleep apnea   Atrial fibrillation (HCC)   Essential hypertension   Hypothyroid   Bronchiectasis without complication (HCC)    Acute on chronic systolic CHF: Change IV to oral Lasix.  Monitor BMP, daily weight and urine output. 2D echo in April 2024 showed EF estimated at 40 to 45%, moderate LVH, indeterminate LV diastolic parameters, mild to moderate TR.   Acute hypoxic respiratory failure: She is still requiring 2 L/min oxygen via [place].  She may need oxygen on discharge.  Check pulse oximetry with ambulation.  Wean off oxygen as able.   Hypokalemia: Improved   Paroxysmal atrial fibrillation: Continue amiodarone, metoprolol and Eliquis   Recent nuclear stress test on 02/03/2023 did not show any evidence of reversible ischemia. Recent pacemaker check on 01/27/2023 showed normal device function.   Other comorbidities include bronchiectasis, hypothyroidism, history of tachy-brady syndrome with permanent pacemaker in place.   Diet Order             Diet Heart Room service appropriate? Yes; Fluid consistency: Thin  Diet effective now                             Consultants: None  Procedures: None    Medications:    amiodarone  200 mg Oral Daily   apixaban  5 mg Oral BID   DULoxetine  60 mg Oral QHS   furosemide  40 mg Oral Daily   gabapentin  600 mg Oral QHS   levothyroxine  125 mcg Oral Q0600   metoprolol tartrate  75 mg Oral BID   pantoprazole  40 mg Oral Daily   pravastatin  40 mg Oral QHS   sodium chloride flush  3 mL Intravenous Q12H   Continuous Infusions:  sodium chloride       Anti-infectives (From admission, onward)    None              Family Communication/Anticipated D/C date and plan/Code Status   DVT prophylaxis:  apixaban (ELIQUIS) tablet 5 mg     Code Status: DNR  Family Communication: None Disposition Plan: Plan to discharge home tomorrow   Status is: Inpatient Remains inpatient appropriate because: CHF exacerbation       Subjective:   Interval events noted.  She feels better but she feels a little short of breath with exertion.  No leg swelling or chest pain.  She is unable to come off of oxygen.  Objective:    Vitals:   02/06/23 0800 02/06/23 0830 02/06/23 0842 02/06/23 1255  BP: 130/87 137/82 137/82 124/83  Pulse: 80 80 80 81  Resp: (!) 24 (!) 27 (!) 27 18  Temp:  98.6 F (37 C) 98.6 F (37 C)  TempSrc:   Oral   SpO2: 94% 95% 95% 94%   No data found.  No intake or output data in the 24 hours ending 02/06/23 1324 There were no vitals filed for this visit.  Exam:   GEN: NAD SKIN: No rash EYES: EOMI ENT: MMM CV: RRR PULM: Bibasilar rales.  No wheezing ABD: soft, ND, NT, +BS CNS: AAO x 3, non focal EXT: No edema or tenderness      Data Reviewed:   I have personally reviewed following labs and imaging studies:  Labs: Labs show the following:   Basic Metabolic Panel: Recent Labs  Lab 02/04/23 1348 02/05/23 0534 02/06/23 0428  NA 133* 134* 138  K 3.9 3.3* 3.8  CL 101 99 100  CO2 25 28 29   GLUCOSE 120* 119* 117*  BUN 14 12  20   CREATININE 0.73 0.90 1.03*  CALCIUM 8.9 8.7* 9.2  MG  --  1.9 1.9   GFR Estimated Creatinine Clearance: 38.8 mL/min (A) (by C-G formula based on SCr of 1.03 mg/dL (H)). Liver Function Tests: Recent Labs  Lab 02/04/23 1348  AST 29  ALT 23  ALKPHOS 114  BILITOT 1.0  PROT 6.9  ALBUMIN 3.2*   No results for input(s): "LIPASE", "AMYLASE" in the last 168 hours. No results for input(s): "AMMONIA" in the last 168 hours. Coagulation profile No results for input(s): "INR", "PROTIME" in the last 168 hours.  CBC: Recent Labs  Lab 02/04/23 1348 02/05/23 0534  WBC 9.3 7.6  NEUTROABS 7.1 5.3  HGB 11.6* 10.6*  HCT 36.6 35.0*  MCV 97.6 99.7  PLT 298 285   Cardiac Enzymes: No results for input(s): "CKTOTAL", "CKMB", "CKMBINDEX", "TROPONINI" in the last 168 hours. BNP (last 3 results) Recent Labs    11/15/22 1301  PROBNP 11,648*   CBG: No results for input(s): "GLUCAP" in the last 168 hours. D-Dimer: No results for input(s): "DDIMER" in the last 72 hours. Hgb A1c: No results for input(s): "HGBA1C" in the last 72 hours. Lipid Profile: No results for input(s): "CHOL", "HDL", "LDLCALC", "TRIG", "CHOLHDL", "LDLDIRECT" in the last 72 hours. Thyroid function studies: No results for input(s): "TSH", "T4TOTAL", "T3FREE", "THYROIDAB" in the last 72 hours.  Invalid input(s): "FREET3" Anemia work up: No results for input(s): "VITAMINB12", "FOLATE", "FERRITIN", "TIBC", "IRON", "RETICCTPCT" in the last 72 hours. Sepsis Labs: Recent Labs  Lab 02/04/23 1348 02/05/23 0534  PROCALCITON <0.10  --   WBC 9.3 7.6    Microbiology Recent Results (from the past 240 hour(s))  Resp panel by RT-PCR (RSV, Flu A&B, Covid) Anterior Nasal Swab     Status: None   Collection Time: 02/04/23  5:03 PM   Specimen: Anterior Nasal Swab  Result Value Ref Range Status   SARS Coronavirus 2 by RT PCR NEGATIVE NEGATIVE Final    Comment: (NOTE) SARS-CoV-2 target nucleic acids are NOT DETECTED.  The  SARS-CoV-2 RNA is generally detectable in upper respiratory specimens during the acute phase of infection. The lowest concentration of SARS-CoV-2 viral copies this assay can detect is 138 copies/mL. A negative result does not preclude SARS-Cov-2 infection and should not be used as the sole basis for treatment or other patient management decisions. A negative result may occur with  improper specimen collection/handling, submission of specimen other than nasopharyngeal swab, presence of viral mutation(s) within the areas targeted by this assay, and inadequate number of viral copies(<138 copies/mL). A negative result must be combined with clinical observations,  patient history, and epidemiological information. The expected result is Negative.  Fact Sheet for Patients:  BloggerCourse.com  Fact Sheet for Healthcare Providers:  SeriousBroker.it  This test is no t yet approved or cleared by the [place] FDA and  has been authorized for detection and/or diagnosis of SARS-CoV-2 by FDA under an Emergency Use Authorization (EUA). This EUA will remain  in effect (meaning this test can be used) for the duration of the COVID-19 declaration under Section 564(b)(1) of the Act, 21 U.S.C.section 360bbb-3(b)(1), unless the authorization is terminated  or revoked sooner.       Influenza A by PCR NEGATIVE NEGATIVE Final   Influenza B by PCR NEGATIVE NEGATIVE Final    Comment: (NOTE) The Xpert Xpress SARS-CoV-2/FLU/RSV plus assay is intended as an aid in the diagnosis of influenza from Nasopharyngeal swab specimens and should not be used as a sole basis for treatment. Nasal washings and aspirates are unacceptable for Xpert Xpress SARS-CoV-2/FLU/RSV testing.  Fact Sheet for Patients: BloggerCourse.com  Fact Sheet for Healthcare Providers: SeriousBroker.it  This test is not yet approved or  cleared by the [place] FDA and has been authorized for detection and/or diagnosis of SARS-CoV-2 by FDA under an Emergency Use Authorization (EUA). This EUA will remain in effect (meaning this test can be used) for the duration of the COVID-19 declaration under Section 564(b)(1) of the Act, 21 U.S.C. section 360bbb-3(b)(1), unless the authorization is terminated or revoked.     Resp Syncytial Virus by PCR NEGATIVE NEGATIVE Final    Comment: (NOTE) Fact Sheet for Patients: BloggerCourse.com  Fact Sheet for Healthcare Providers: SeriousBroker.it  This test is not yet approved or cleared by the [place] FDA and has been authorized for detection and/or diagnosis of SARS-CoV-2 by FDA under an Emergency Use Authorization (EUA). This EUA will remain in effect (meaning this test can be used) for the duration of the COVID-19 declaration under Section 564(b)(1) of the Act, 21 U.S.C. section 360bbb-3(b)(1), unless the authorization is terminated or revoked.  Performed at [facility], [address] [place], [place] [address]     Procedures and diagnostic studies:  DG Chest 2 View  Result Date: 02/04/2023 CLINICAL DATA:  Shortness of breath EXAM: CHEST - 2 VIEW COMPARISON:  November 08, 2022 FINDINGS: The cardiomediastinal silhouette is stable. No pneumothorax. No nodules or masses. Stable pacemaker. Coarsened lung markings and patchy opacities bilaterally. No other acute abnormalities are identified. IMPRESSION: Patchy bilateral pulmonary opacities are favored to represent an infectious process. Asymmetric pulmonary edema is another possibility. No other acute abnormalities. Electronically Signed   By: Gerome Sam III M.D.   On: 02/04/2023 14:44               LOS: 2 days   Tishanna Dunford  [facility]   Pager on [email or web address]. If 7PM-7AM, please contact night-coverage at  [email or web address]     02/06/2023, 1:24 PM

## 2023-02-06 NOTE — ED Notes (Signed)
Dispo status charted in error. Disregard

## 2023-02-06 NOTE — ED Notes (Signed)
Attempted to trial pt without oxygen. Sats dropped to 87%. Placed back on 1 L [place] and back up to 93-94%.

## 2023-02-07 LAB — BASIC METABOLIC PANEL
Anion gap: 7 (ref 5–15)
BUN: 16 mg/dL (ref 8–23)
CO2: 32 mmol/L (ref 22–32)
Calcium: 9.1 mg/dL (ref 8.9–10.3)
Chloride: 99 mmol/L (ref 98–111)
Creatinine, Ser: 0.85 mg/dL (ref 0.44–1.00)
GFR, Estimated: >60 mL/min (ref >60)
Glucose, Bld: 116 mg/dL — ABNORMAL HIGH (ref 70–99)
Potassium: 3.8 mmol/L (ref 3.5–5.1)
Sodium: 138 mmol/L (ref 135–145)

## 2023-02-07 MED ORDER — POTASSIUM CHLORIDE CRYS ER 10 MEQ PO TBCR: 10.0000 meq | EXTENDED_RELEASE_TABLET | Freq: Every day | ORAL | Status: DC

## 2023-02-07 MED ORDER — FUROSEMIDE 20 MG PO TABS: 40.0000 mg | ORAL_TABLET | Freq: Every day | ORAL | Status: DC

## 2023-02-07 NOTE — Discharge Summary (Signed)
Physician Discharge Summary   Patient: Kerri Mills MRN: [id number] DOB: Jul 02, 1931  Admit date:     02/04/2023  Discharge date: 02/07/2023  Discharge Physician: Lurene Shadow   PCP: Dale [place], MD   Recommendations at discharge:   Follow-up with PCP in 1 week  Discharge Diagnoses: Principal Problem:   Acute on chronic HFrEF (heart failure with reduced ejection fraction) (HCC) Active Problems:   Acute respiratory failure with hypoxia (HCC)   Obstructive sleep apnea   Atrial fibrillation (HCC)   Essential hypertension   Hypothyroid   Bronchiectasis without complication (HCC)  Resolved Problems:   * No resolved hospital problems. [facility] Course:  Ms. Kerri Mills is a 89 y.o. female with medical history significant for chronic systolic CHF with EF of 40%, tachy-brady syndrome s/p permanent pacemaker, hypertension, atrial fibrillation on Eliquis, OSA, who presented to the hospital with productive cough, shortness of breath and bilateral leg swelling.Marland Kitchen      She was admitted to the hospital for acute exacerbation of chronic systolic CHF.    Assessment and Plan:   Acute on chronic systolic CHF: Her condition has improved.  She will be discharged on oral Lasix scheduled daily instead of as needed. 2D echo in April 2024 showed EF estimated at 40 to 45%, moderate LVH, indeterminate LV diastolic parameters, mild to moderate TR.     Acute on chronic hypoxic respiratory failure: She could not be weaned off of oxygen.  She will be discharged on 1 L/min oxygen via [place].       Hypokalemia: Improved     Paroxysmal atrial fibrillation: Continue amiodarone, metoprolol and Eliquis     Recent nuclear stress test on 02/03/2023 did not show any evidence of reversible ischemia. Recent pacemaker check on 01/27/2023 showed normal device function.     Other comorbidities include bronchiectasis, hypothyroidism, history of tachy-brady syndrome with permanent pacemaker in place.           Consultants: None Procedures performed: None  Disposition: Home Diet recommendation:  Discharge Diet Orders (From admission, onward)     Start     Ordered   02/07/23 0000  Diet - low sodium heart healthy        02/07/23 0955           Cardiac diet DISCHARGE MEDICATION: Allergies as of 02/07/2023       Reactions   Lipitor [atorvastatin Calcium] Other (See Comments)   Stiffness & soreness   Penicillins Rash        Medication List     STOP taking these medications    Align 4 MG Caps   Potassium Chloride Crys       TAKE these medications    acetaminophen 650 MG CR tablet Commonly known as: TYLENOL Take 650 mg by mouth every 8 (eight) hours as needed for pain.   albuterol 108 (90 Base) MCG/ACT inhaler Commonly known as: VENTOLIN HFA Inhale 2 puffs into the lungs every 6 (six) hours as needed for wheezing or shortness of breath.   amiodarone 200 MG tablet Commonly known as: PACERONE TAKE 1 TABLET BY MOUTH DAILY   cetirizine 5 MG tablet Commonly known as: ZYRTEC Take 1 tablet (5 mg total) by mouth daily.   cholecalciferol 25 MCG (1000 UNIT) tablet Commonly known as: VITAMIN D3 Take 2,000 Units by mouth daily.   DULoxetine 60 MG capsule Commonly known as: CYMBALTA TAKE ONE CAPSULE AT BEDTIME   Eliquis 5 MG Tabs tablet Generic drug: apixaban TAKE  ONE TABLET BY MOUTH TWICE DAILY   furosemide 20 MG tablet Commonly known as: LASIX Take 2 tablets (40 mg total) by mouth daily. What changed:  how much to take when to take this additional instructions   gabapentin 300 MG capsule Commonly known as: NEURONTIN Take 2 capsules (600 mg total) by mouth at bedtime.   GENTEAL TEARS NIGHT-TIME OP Place 1 application into both eyes at bedtime. Night time ointment 3.5g   levothyroxine 125 MCG tablet Commonly known as: SYNTHROID Take 1 tablet (125 mcg total) by mouth daily.   lovastatin 40 MG tablet Commonly known as: MEVACOR TAKE 1 TABLET  BY MOUTH DAILY What changed: when to take this   metoprolol tartrate 50 MG tablet Commonly known as: LOPRESSOR TAKE 1.5 TABLETS BY MOUTH TWICE DAILY. What changed: See the new instructions.   omeprazole 20 MG capsule Commonly known as: PRILOSEC TAKE 1 CAPSULE BY MOUTH ONCE DAILY   polyethylene glycol powder 17 GM/SCOOP powder Commonly known as: GLYCOLAX/MIRALAX Take 17 g by mouth daily.   potassium chloride 10 MEQ tablet Commonly known as: KLOR-CON M Take 1 tablet (10 mEq total) by mouth daily. What changed: when to take this   PRESERVISION AREDS 2+MULTI VIT PO Take 1 tablet by mouth 2 (two) times daily.   SYSTANE OP Place 1 drop into both eyes daily as needed (for dry eyes).   triamcinolone cream 0.1 % Commonly known as: KENALOG Apply 1 application. topically 2 (two) times daily.               Durable Medical Equipment  (From admission, onward)           Start     Ordered   Unscheduled  DME Oxygen  Once       Question Answer Comment  Length of Need Lifetime   Mode or (Route) Nasal cannula   Liters per Minute 1   Frequency Continuous (stationary and portable oxygen unit needed)   Oxygen conserving device Yes   Oxygen delivery system Gas      02/07/23 0955            Discharge Exam: Filed Weights   02/06/23 2041  Weight: 73.8 kg   GEN: NAD SKIN: Warm and dry EYES: Anicteric ENT: MMM CV: RRR PULM: CTA B ABD: soft, ND, NT, +BS CNS: AAO x 3, non focal EXT: No edema or tenderness   Condition at discharge: good  The results of significant diagnostics from this hospitalization (including imaging, microbiology, ancillary and laboratory) are listed below for reference.   Imaging Studies: NM Myocar Multi W/Spect W/Wall Motion / EF  Result Date: 02/04/2023 Pharmacological myocardial perfusion imaging study with no significant  ischemia Small region fixed defect in the distal anterior and apical region, unable to exclude attenuation artifact  versus small prior MI Small region hypokinesis in the apical wall, EF estimated at 40% No EKG changes concerning for ischemia at peak stress or in recovery.,  Paced rhythm CT attenuation correction images with: Pacing wire in the RA and RV, mild diffuse aortic atherosclerosis, minimal coronary calcification Low risk scan Signed, Dossie Arbour, MD, Ph.D [facility]   DG Chest 2 View  Result Date: 02/04/2023 CLINICAL DATA:  Shortness of breath EXAM: CHEST - 2 VIEW COMPARISON:  November 08, 2022 FINDINGS: The cardiomediastinal silhouette is stable. No pneumothorax. No nodules or masses. Stable pacemaker. Coarsened lung markings and patchy opacities bilaterally. No other acute abnormalities are identified. IMPRESSION: Patchy bilateral pulmonary opacities are favored to  represent an infectious process. Asymmetric pulmonary edema is another possibility. No other acute abnormalities. Electronically Signed   By: Gerome Sam III M.D.   On: 02/04/2023 14:44   CUP PACEART INCLINIC DEVICE CHECK  Result Date: 01/27/2023 Pacemaker check in clinic. Normal device function. Thresholds, sensing, impedances consistent with previous measurements. Device programmed to maximize longevity. No mode switch or high ventricular rates noted. Device programmed at appropriate safety margins. Histogram distribution appropriate for patient activity level. Device programmed to optimize intrinsic conduction. Estimated longevity 4.6 years. Patient enrolled in remote follow-up. Patient education completed.  ECHOCARDIOGRAM COMPLETE  Result Date: 01/24/2023    ECHOCARDIOGRAM REPORT   Patient Name:   TAISHMARA SADUSKY Hopes Date of Exam: 01/24/2023 Medical Rec #:  [id number]    Height:       65.0 in Accession #:    [id number]   Weight:       169.6 lb Date of Birth:  09/09/1931    BSA:          1.844 m Patient Age:    89 years     BP:           138/78 mmHg Patient Gender: F            HR:           81 bpm. Exam Location:  [place] Procedure: 2D Echo,  Cardiac Doppler, Color Doppler and Intracardiac            Opacification Agent Indications:    Atrial fibrillation and Flutter  History:        Patient has prior history of Echocardiogram examinations.                 Arrythmias:Atrial Fibrillation, PVC and LBBB,                 Signs/Symptoms:Shortness of Breath; Risk Factors:Hypertension.  Sonographer:    Rolland Porter Referring Phys: [id number] Graciella Freer  Sonographer Comments: Chest wall deformity. IMPRESSIONS  1. Left ventricular ejection fraction, by estimation, is 40 to 45%. Left ventricular ejection fraction by 3D volume is 42 %. The left ventricle has mildly decreased function. The left ventricle demonstrates global hypokinesis. There is moderate left ventricular hypertrophy. Left ventricular diastolic parameters are indeterminate.  2. Right ventricular systolic function is normal. The right ventricular size is normal. There is mildly elevated pulmonary artery systolic pressure. The estimated right ventricular systolic pressure is 42.9 mmHg.  3. The mitral valve is normal in structure. No evidence of mitral valve regurgitation. No evidence of mitral stenosis.  4. Tricuspid valve regurgitation is mild to moderate.  5. The aortic valve is tricuspid. Aortic valve regurgitation is not visualized. Aortic valve sclerosis is present, with no evidence of aortic valve stenosis.  6. There is borderline dilatation of the ascending aorta, measuring 37 mm.  7. The inferior vena cava is normal in size with greater than 50% respiratory variability, suggesting right atrial pressure of 3 mmHg. Comparison(s): 10/21/19 55-60, LAE. FINDINGS  Left Ventricle: Left ventricular ejection fraction, by estimation, is 40 to 45%. Left ventricular ejection fraction by 3D volume is 42 %. The left ventricle has mildly decreased function. The left ventricle demonstrates global hypokinesis. Definity contrast agent was given IV to delineate the left ventricular endocardial borders. The  left ventricular internal cavity size was normal in size. There is moderate left ventricular hypertrophy. Left ventricular diastolic parameters are indeterminate. Right Ventricle: The right ventricular size is normal. No increase in right ventricular wall  thickness. Right ventricular systolic function is normal. There is mildly elevated pulmonary artery systolic pressure. The tricuspid regurgitant velocity is 3.08  m/s, and with an assumed right atrial pressure of 5 mmHg, the estimated right ventricular systolic pressure is 42.9 mmHg. Left Atrium: Left atrial size was normal in size. Right Atrium: Right atrial size was normal in size. Pericardium: There is no evidence of pericardial effusion. Mitral Valve: The mitral valve is normal in structure. No evidence of mitral valve regurgitation. No evidence of mitral valve stenosis. Tricuspid Valve: The tricuspid valve is normal in structure. Tricuspid valve regurgitation is mild to moderate. No evidence of tricuspid stenosis. Aortic Valve: The aortic valve is tricuspid. Aortic valve regurgitation is not visualized. Aortic valve sclerosis is present, with no evidence of aortic valve stenosis. Aortic valve mean gradient measures 2.5 mmHg. Aortic valve peak gradient measures 4.5  mmHg. Aortic valve area, by VTI measures 3.65 cm. Pulmonic Valve: The pulmonic valve was normal in structure. Pulmonic valve regurgitation is mild. No evidence of pulmonic stenosis. Aorta: The aortic root is normal in size and structure. There is borderline dilatation of the ascending aorta, measuring 37 mm. Venous: The inferior vena cava is normal in size with greater than 50% respiratory variability, suggesting right atrial pressure of 3 mmHg. IAS/Shunts: No atrial level shunt detected by color flow Doppler.  LEFT VENTRICLE PLAX 2D LVIDd:         4.30 cm         Diastology LVIDs:         3.30 cm         LV e' medial:    4.68 cm/s LV PW:         1.30 cm         LV E/e' medial:  19.4 LV IVS:         1.80 cm         LV e' lateral:   7.40 cm/s LVOT diam:     2.20 cm         LV E/e' lateral: 12.3 LV SV:         75 LV SV Index:   40 LVOT Area:     3.80 cm        3D Volume EF                                LV 3D EF:    Left                                             ventricul LV Volumes (MOD)                            ar LV vol d, MOD    88.1 ml                    ejection A2C:                                        fraction LV vol d, MOD    106.8 ml  by 3D A4C:                                        volume is LV vol s, MOD    41.1 ml                    42 %. A2C: LV vol s, MOD    66.9 ml A4C:                           3D Volume EF: LV SV MOD A2C:   47.0 ml       3D EF:        42 % LV SV MOD A4C:   106.8 ml      LV EDV:       109 ml LV SV MOD BP:    43.5 ml       LV ESV:       63 ml                                LV SV:        46 ml RIGHT VENTRICLE RV S prime:     13.30 cm/s TAPSE (M-mode): 2.2 cm LEFT ATRIUM         Index LA diam:    3.90 cm 2.11 cm/m  AORTIC VALVE                    PULMONIC VALVE AV Area (Vmax):    3.57 cm     PV Vmax:          0.81 m/s AV Area (Vmean):   3.48 cm     PV Peak grad:     2.6 mmHg AV Area (VTI):     3.65 cm     PR End Diast Vel: 5.11 msec AV Vmax:           106.50 cm/s AV Vmean:          72.800 cm/s AV VTI:            0.204 m AV Peak Grad:      4.5 mmHg AV Mean Grad:      2.5 mmHg LVOT Vmax:         100.00 cm/s LVOT Vmean:        66.600 cm/s LVOT VTI:          0.196 m LVOT/AV VTI ratio: 0.96  AORTA Ao Root diam: 3.40 cm Ao Asc diam:  3.70 cm MITRAL VALVE               TRICUSPID VALVE MV Area (PHT): 5.38 cm    TR Peak grad:   37.9 mmHg MV Decel Time: 141 msec    TR Vmax:        308.00 cm/s MV E velocity: 90.70 cm/s MV A velocity: 81.80 cm/s  SHUNTS MV E/A ratio:  1.11        Systemic VTI:  0.20 m                            Systemic Diam: 2.20 cm Julien Nordmann MD Electronically signed by Julien Nordmann MD Signature Date/Time: 01/24/2023/6:41:01 PM    Final  CUP PACEART REMOTE DEVICE CHECK  Result Date: 01/18/2023 Scheduled remote reviewed. Normal device function.  Next remote 91 days. LA, CVRS   Microbiology: Results for orders placed or performed during the hospital encounter of 02/04/23  Resp panel by RT-PCR (RSV, Flu A&B, Covid) Anterior Nasal Swab     Status: None   Collection Time: 02/04/23  5:03 PM   Specimen: Anterior Nasal Swab  Result Value Ref Range Status   SARS Coronavirus 2 by RT PCR NEGATIVE NEGATIVE Final    Comment: (NOTE) SARS-CoV-2 target nucleic acids are NOT DETECTED.  The SARS-CoV-2 RNA is generally detectable in upper respiratory specimens during the acute phase of infection. The lowest concentration of SARS-CoV-2 viral copies this assay can detect is 138 copies/mL. A negative result does not preclude SARS-Cov-2 infection and should not be used as the sole basis for treatment or other patient management decisions. A negative result may occur with  improper specimen collection/handling, submission of specimen other than nasopharyngeal swab, presence of viral mutation(s) within the areas targeted by this assay, and inadequate number of viral copies(<138 copies/mL). A negative result must be combined with clinical observations, patient history, and epidemiological information. The expected result is Negative.  Fact Sheet for Patients:  BloggerCourse.com  Fact Sheet for Healthcare Providers:  SeriousBroker.it  This test is no t yet approved or cleared by the [place] FDA and  has been authorized for detection and/or diagnosis of SARS-CoV-2 by FDA under an Emergency Use Authorization (EUA). This EUA will remain  in effect (meaning this test can be used) for the duration of the COVID-19 declaration under Section 564(b)(1) of the Act, 21 U.S.C.section 360bbb-3(b)(1), unless the authorization is terminated  or revoked sooner.       Influenza A by PCR  NEGATIVE NEGATIVE Final   Influenza B by PCR NEGATIVE NEGATIVE Final    Comment: (NOTE) The Xpert Xpress SARS-CoV-2/FLU/RSV plus assay is intended as an aid in the diagnosis of influenza from Nasopharyngeal swab specimens and should not be used as a sole basis for treatment. Nasal washings and aspirates are unacceptable for Xpert Xpress SARS-CoV-2/FLU/RSV testing.  Fact Sheet for Patients: BloggerCourse.com  Fact Sheet for Healthcare Providers: SeriousBroker.it  This test is not yet approved or cleared by the [place] FDA and has been authorized for detection and/or diagnosis of SARS-CoV-2 by FDA under an Emergency Use Authorization (EUA). This EUA will remain in effect (meaning this test can be used) for the duration of the COVID-19 declaration under Section 564(b)(1) of the Act, 21 U.S.C. section 360bbb-3(b)(1), unless the authorization is terminated or revoked.     Resp Syncytial Virus by PCR NEGATIVE NEGATIVE Final    Comment: (NOTE) Fact Sheet for Patients: BloggerCourse.com  Fact Sheet for Healthcare Providers: SeriousBroker.it  This test is not yet approved or cleared by the [place] FDA and has been authorized for detection and/or diagnosis of SARS-CoV-2 by FDA under an Emergency Use Authorization (EUA). This EUA will remain in effect (meaning this test can be used) for the duration of the COVID-19 declaration under Section 564(b)(1) of the Act, 21 U.S.C. section 360bbb-3(b)(1), unless the authorization is terminated or revoked.  Performed at [facility], [address] [place], [place] [address]    *Note: Due to a large number of results and/or encounters for the requested time period, some results have not been displayed. A complete set of results can be found in Results Review.    Labs: CBC: Recent Labs  Lab 02/04/23  1348 02/05/23 0534   WBC 9.3 7.6  NEUTROABS 7.1 5.3  HGB 11.6* 10.6*  HCT 36.6 35.0*  MCV 97.6 99.7  PLT 298 285   Basic Metabolic Panel: Recent Labs  Lab 02/04/23 1348 02/05/23 0534 02/06/23 0428 02/07/23 0525  NA 133* 134* 138 138  K 3.9 3.3* 3.8 3.8  CL 101 99 100 99  CO2 25 28 29  32  GLUCOSE 120* 119* 117* 116*  BUN 14 12 20 16   CREATININE 0.73 0.90 1.03* 0.85  CALCIUM 8.9 8.7* 9.2 9.1  MG  --  1.9 1.9  --    Liver Function Tests: Recent Labs  Lab 02/04/23 1348  AST 29  ALT 23  ALKPHOS 114  BILITOT 1.0  PROT 6.9  ALBUMIN 3.2*   CBG: No results for input(s): "GLUCAP" in the last 168 hours.  Discharge time spent: greater than 30 minutes.  Signed: Lurene Shadow, MD [facility] 02/07/2023

## 2023-02-07 NOTE — Care Management Important Message (Signed)
Important Message  Patient Details  Name: Kerri Mills MRN: [id number] Date of Birth: [birth year]   Medicare Important Message Given:  Yes     Johnell Comings 02/07/2023, 11:35 AM

## 2023-02-07 NOTE — Consult Note (Addendum)
[facility] Customer service manager [facility]) Accountable Care Organization (ACO) [facility] Liaison Note  02/07/2023  Kerri Mills Jun 11, 1931 [id number]  Location: [facility] RN Hospital Liaison screened the patient remotely at [facility].  Insurance: [employer]   Kerri Mills is a 89 y.o. female who is a Primary Care Patient of Dale [place], MD Designer, television/film set Healthcare at [employer]). The patient was screened for  day readmission hospitalization with noted medium risk score for unplanned readmission risk with 4 IP in 6 months.  The patient was assessed for potential [facility]) Care Management service needs for post hospital transition for care coordination. Review of patient's electronic medical record reveals patient was admitted for Acute Respiratory Failure. [facility] indicates [facility] is following this pt.   Plan: [facility] Liaison will continue to follow progress and disposition to asess for post hospital community care coordination/management needs.  Referral request for community care coordination: anticipate [facility] Transitions of Care Team follow up.   [facility] Care Management/Population Health does not replace or interfere with any arrangements made by the Inpatient Transition of Care team.   For questions contact:   Elliot Cousin, RN, BSN [facility] Liaison [place]   [facility]  Population Health Office Hours MTWF 8:00 am to 6 pm off on Thursday [phone] mobile [phone] [Office toll free line][facility] Office Hours are M-F 8:30 - 5 pm 24 hour nurse advise line [phone] Conceirge  [email or web address]

## 2023-02-07 NOTE — TOC Transition Note (Signed)
Transition of Care [facility]) - CM/SW Discharge Note   Patient Details  Name: Kerri Mills MRN: [id number] Date of Birth: November 16, 1930  Transition of Care [facility]) CM/SW Contact:  Truddie Hidden, RN Phone Number: 02/07/2023, 12:10 PM   Clinical Narrative:    Spoke with patient regarding discharge home today.  Her daughter will be transporting her home.  She has home health most recently with [facility] and would like services for PT provided by them again Referral sent to Amy from [facility]. She was advised oxygen is needed she has requested [employer].  Referral for home oxygen sent to [facility] from [facility].  MD and nurse notified of services arranged.  TOC signing off.          Patient Goals and CMS Choice      Discharge Placement                         Discharge Plan and Services Additional resources added to the After Visit Summary for                                       Social Determinants of Health (SDOH) Interventions SDOH Screenings   Food Insecurity: No Food Insecurity (01/30/2023)  Housing: Low Risk  (01/30/2023)  Transportation Needs: No Transportation Needs (01/30/2023)  Utilities: Not At Risk (01/30/2023)  Alcohol Screen: Low Risk  (01/30/2023)  Depression (PHQ2-9): Low Risk  (01/30/2023)  Financial Resource Strain: Low Risk  (01/30/2023)  Physical Activity: Sufficiently Active (02/08/2022)  Social Connections: Socially Integrated (01/30/2023)  Stress: No Stress Concern Present (01/30/2023)  Tobacco Use: Low Risk  (02/05/2023)     Readmission Risk Interventions     No data to display

## 2023-02-09 ENCOUNTER — Telehealth: Payer: Self-pay | Admitting: Internal Medicine

## 2023-02-09 ENCOUNTER — Telehealth: Payer: Self-pay | Admitting: *Deleted

## 2023-02-09 DIAGNOSIS — I5022 Chronic systolic (congestive) heart failure: Secondary | ICD-10-CM | POA: Diagnosis not present

## 2023-02-09 DIAGNOSIS — I5023 Acute on chronic systolic (congestive) heart failure: Secondary | ICD-10-CM | POA: Diagnosis not present

## 2023-02-09 DIAGNOSIS — I499 Cardiac arrhythmia, unspecified: Secondary | ICD-10-CM | POA: Diagnosis not present

## 2023-02-09 DIAGNOSIS — D689 Coagulation defect, unspecified: Secondary | ICD-10-CM | POA: Diagnosis not present

## 2023-02-09 DIAGNOSIS — Z7901 Long term (current) use of anticoagulants: Secondary | ICD-10-CM | POA: Diagnosis not present

## 2023-02-09 DIAGNOSIS — I4819 Other persistent atrial fibrillation: Secondary | ICD-10-CM | POA: Diagnosis not present

## 2023-02-09 DIAGNOSIS — R0602 Shortness of breath: Secondary | ICD-10-CM | POA: Diagnosis not present

## 2023-02-09 DIAGNOSIS — J410 Simple chronic bronchitis: Secondary | ICD-10-CM | POA: Diagnosis not present

## 2023-02-09 DIAGNOSIS — Z9989 Dependence on other enabling machines and devices: Secondary | ICD-10-CM | POA: Diagnosis not present

## 2023-02-09 DIAGNOSIS — G4733 Obstructive sleep apnea (adult) (pediatric): Secondary | ICD-10-CM | POA: Diagnosis not present

## 2023-02-09 DIAGNOSIS — J479 Bronchiectasis, uncomplicated: Secondary | ICD-10-CM | POA: Diagnosis not present

## 2023-02-09 DIAGNOSIS — I110 Hypertensive heart disease with heart failure: Secondary | ICD-10-CM | POA: Diagnosis not present

## 2023-02-09 DIAGNOSIS — E039 Hypothyroidism, unspecified: Secondary | ICD-10-CM | POA: Diagnosis not present

## 2023-02-09 NOTE — Telephone Encounter (Signed)
Irving Burton [phone] homehealth care nurse.  Needs clarification on how much oxygen patient should be on , ordered from the hospital. Also, patient has a cough, and she is hearing crackles, did cough up white mucus.

## 2023-02-09 NOTE — Telephone Encounter (Signed)
Called Irving Burton to follow up. Advised we do not have a d/c summary yet but per PT evaluation patient was on 1L nasal cannula while ambulating. Oxygen on room air at rest was 92%. Irving Burton (home health nurse) asked for verbal to use oxygen 1-2 liters prn to keep O2 sat >90% until we can clarify what she was discharged with. Irving Burton also requested verbal orders for PT 1w1, 2w2, 1w1, Add nursing evaluation. Verbals given. They will fax orders for you to sign. Irving Burton has advised patient to make sure she is drinking plenty of fluids and to let her or [place] know if the mucus she is getting up changes colors and/or she develops any new symptoms. Confirmed patient doing ok. Has HFU next week.

## 2023-02-09 NOTE — Transitions of Care (Post Inpatient/ED Visit) (Signed)
   02/09/2023  Name: Kerri Mills MRN: [id number] DOB: Jul 24, 1931  Today's TOC FU Call Status: Today's TOC FU Call Status:: Unsuccessul Call (1st Attempt) Unsuccessful Call (1st Attempt) Date: 02/09/23  Attempted to reach the patient regarding the most recent Inpatient/ED visit.  Follow Up Plan: Additional outreach attempts will be made to reach the patient to complete the Transitions of Care (Post Inpatient/ED visit) call.   Gean Maidens BSN RN [facility] [phone]

## 2023-02-10 NOTE — Telephone Encounter (Signed)
Noted  

## 2023-02-10 NOTE — Telephone Encounter (Signed)
Noted.  Ok for PT orders/oxygen orders.  Let us know if any problems.

## 2023-02-14 NOTE — Progress Notes (Signed)
Remote pacemaker transmission.   

## 2023-02-16 ENCOUNTER — Ambulatory Visit (INDEPENDENT_AMBULATORY_CARE_PROVIDER_SITE_OTHER): Payer: PPO | Admitting: Internal Medicine

## 2023-02-16 VITALS — BP 112/78 | HR 82 | Temp 97.8°F | Ht 65.0 in | Wt 167.4 lb

## 2023-02-16 DIAGNOSIS — K219 Gastro-esophageal reflux disease without esophagitis: Secondary | ICD-10-CM

## 2023-02-16 DIAGNOSIS — Z853 Personal history of malignant neoplasm of breast: Secondary | ICD-10-CM

## 2023-02-16 DIAGNOSIS — D649 Anemia, unspecified: Secondary | ICD-10-CM

## 2023-02-16 DIAGNOSIS — J479 Bronchiectasis, uncomplicated: Secondary | ICD-10-CM | POA: Diagnosis not present

## 2023-02-16 DIAGNOSIS — G4733 Obstructive sleep apnea (adult) (pediatric): Secondary | ICD-10-CM

## 2023-02-16 DIAGNOSIS — E7800 Pure hypercholesterolemia, unspecified: Secondary | ICD-10-CM

## 2023-02-16 DIAGNOSIS — I10 Essential (primary) hypertension: Secondary | ICD-10-CM

## 2023-02-16 DIAGNOSIS — J9691 Respiratory failure, unspecified with hypoxia: Secondary | ICD-10-CM | POA: Diagnosis not present

## 2023-02-16 DIAGNOSIS — R059 Cough, unspecified: Secondary | ICD-10-CM

## 2023-02-16 DIAGNOSIS — Z85850 Personal history of malignant neoplasm of thyroid: Secondary | ICD-10-CM

## 2023-02-16 DIAGNOSIS — I480 Paroxysmal atrial fibrillation: Secondary | ICD-10-CM

## 2023-02-16 DIAGNOSIS — I495 Sick sinus syndrome: Secondary | ICD-10-CM | POA: Diagnosis not present

## 2023-02-16 DIAGNOSIS — J411 Mucopurulent chronic bronchitis: Secondary | ICD-10-CM

## 2023-02-16 DIAGNOSIS — I5022 Chronic systolic (congestive) heart failure: Secondary | ICD-10-CM

## 2023-02-16 DIAGNOSIS — F419 Anxiety disorder, unspecified: Secondary | ICD-10-CM

## 2023-02-16 DIAGNOSIS — R739 Hyperglycemia, unspecified: Secondary | ICD-10-CM

## 2023-02-16 DIAGNOSIS — I700 Atherosclerosis of aorta: Secondary | ICD-10-CM

## 2023-02-16 LAB — CBC WITH DIFFERENTIAL/PLATELET
Basophils Absolute: 0 10*3/uL (ref 0.0–0.1)
Basophils Relative: 0.5 % (ref 0.0–3.0)
Eosinophils Absolute: 0.1 10*3/uL (ref 0.0–0.7)
Eosinophils Relative: 1.4 % (ref 0.0–5.0)
HCT: 39.1 % (ref 36.0–46.0)
Hemoglobin: 12.1 g/dL (ref 12.0–15.0)
Lymphocytes Relative: 17.9 % (ref 12.0–46.0)
Lymphs Abs: 1.2 10*3/uL (ref 0.7–4.0)
MCHC: 31.1 g/dL (ref 30.0–36.0)
MCV: 96.1 fl (ref 78.0–100.0)
Monocytes Absolute: 0.6 10*3/uL (ref 0.1–1.0)
Monocytes Relative: 8.6 % (ref 3.0–12.0)
Neutro Abs: 4.7 10*3/uL (ref 1.4–7.7)
Neutrophils Relative %: 71.6 % (ref 43.0–77.0)
Platelets: 348 10*3/uL (ref 150.0–400.0)
RBC: 4.07 Mil/uL (ref 3.87–5.11)
RDW: 13.2 % (ref 11.5–15.5)
WBC: 6.6 10*3/uL (ref 4.0–10.5)

## 2023-02-16 LAB — BASIC METABOLIC PANEL
BUN: 16 mg/dL (ref 6–23)
CO2: 33 mEq/L — ABNORMAL HIGH (ref 19–32)
Calcium: 9.5 mg/dL (ref 8.4–10.5)
Chloride: 101 mEq/L (ref 96–112)
Creatinine, Ser: 1.01 mg/dL (ref 0.40–1.20)
GFR: 49.31 mL/min — ABNORMAL LOW (ref >60.00)
Glucose, Bld: 99 mg/dL (ref 70–99)
Potassium: 4.7 mEq/L (ref 3.5–5.1)
Sodium: 141 mEq/L (ref 135–145)

## 2023-02-16 LAB — IBC + FERRITIN
Ferritin: 70.5 ng/mL (ref 10.0–291.0)
Iron: 60 ug/dL (ref 42–145)
Saturation Ratios: 17.9 % — ABNORMAL LOW (ref 20.0–50.0)
TIBC: 334.6 ug/dL (ref 250.0–450.0)
Transferrin: 239 mg/dL (ref 212.0–360.0)

## 2023-02-16 LAB — VITAMIN B12: Vitamin B-12: 346 pg/mL (ref 211–911)

## 2023-02-16 MED ORDER — NYSTATIN 100000 UNIT/GM EX CREA: 1.0000 | TOPICAL_CREAM | Freq: Two times a day (BID) | CUTANEOUS | 0 refills | Status: DC

## 2023-02-16 MED ORDER — NYSTATIN 100000 UNIT/GM EX POWD: 1.0000 | Freq: Two times a day (BID) | CUTANEOUS | 0 refills | Status: DC

## 2023-02-16 NOTE — Progress Notes (Signed)
Subjective:    Patient ID: Kerri Mills, female    DOB: Sep 23, 1931, 89 y.o.   MRN: [id number]  Patient here for hospital follow up.   HPI Here for hospital follow up. Admitted 02/04/23 - 02/07/23 after presenting with productive cough, shortness of breath and bilateral leg swelling.Marland Kitchen She was admitted to the hospital for acute exacerbation of chronic systolic CHF. 2D echo in April 2024 showed EF estimated at 40 to 45%, moderate LVH, indeterminate LV diastolic parameters, mild to moderate TR. Diuresed.  Was discharged on oral daily lasix instead of prn.  Could not be weaned off oxygen.  Discharged on 1 L [place].  Recent nuclear stress test on 02/03/2023 did not show any evidence of reversible ischemia. Recent pacemaker check on 01/27/2023 showed normal device function.  She reports her breathing is better.  She feels better.  Due to f/u with heart failure clinic tomorrow.  Does report persistent cough/congestion.  Due to f/u with Dr Belia Heman in June.  Discussed using flutter valve.  No acid reflux reported.  No abdominal pain or bowel change.     Past Medical History:  Diagnosis Date   Allergy    Anxiety    Arthritis    Atypical chest pain    a. 10/2018 MV: small, fixed apical defect possibly 2/2 attenuation artifact. No ischemia.  EF 68%.   CHF (congestive heart failure) (HCC)    Clotting disorder (HCC)    Colitis    Depression    Diverticulitis 2013   Dysrhythmia    Gastric ulcer    GERD (gastroesophageal reflux disease)    History of echocardiogram    a. 09/2019 Echo: EF 55-60%, mod LVH. Mildly dil LA. Triv MR/TR.   Hypercholesterolemia    Hypertension    Hypothyroidism    Infiltrating lobular carcinoma of left breast 2011   T2,N0, ER: 90%; PR 0%; Her 2 neu not amplified. [facility]).   Melanoma (HCC) 1997   Melanoma in situ of upper extremity (HCC) 03/19/2011   Persistent atrial fibrillation (HCC)    a. CHADS2VASc => 4 (HTN, age x 2, female)   Personal history of radiation therapy  2011   BREAST CA   Presence of permanent cardiac pacemaker    Seroma    HISTORY OF LFT BREAST   Sleep apnea    Thyroid cancer (HCC) 1992   Past Surgical History:  Procedure Laterality Date   ABDOMINAL HYSTERECTOMY  1973   partial   BREAST BIOPSY Left 02-13-13   BENIGN BREAST TISSUE WITH CHANGES CONSISTENT WITH FAT NECROSIS   BREAST BIOPSY Left 01/21/2015   bx done in [facility] office 11:00 left 6-8cmfn   BREAST EXCISIONAL BIOPSY Left 1995   neg   BREAST EXCISIONAL BIOPSY Left 2011   Breast cancer radiation   BREAST LUMPECTOMY Left 2011   BREAST CA   CARDIAC CATHETERIZATION     CHOLECYSTECTOMY     COLONOSCOPY  2013   COLONOSCOPY WITH PROPOFOL N/A 06/09/2021   Procedure: COLONOSCOPY WITH PROPOFOL;  Surgeon: Wyline Mood, MD;  Location: [facility] ENDOSCOPY;  Service: Gastroenterology;  Laterality: N/A;   HAMMER TOE SURGERY Bilateral 02/24/2022   Procedure: HAMMER TOE CORRECTION 2, 3, 4 Right and 2nd Left;  Surgeon: Felecia Shelling, DPM;  Location: [facility] ORS;  Service: Podiatry;  Laterality: Bilateral;   MELANOMA EXCISION     RT UPPER ARM   OPEN REDUCTION INTERNAL FIXATION (ORIF) DISTAL RADIAL FRACTURE Right 12/06/2022   Procedure: OPEN REDUCTION INTERNAL FIXATION (ORIF)  DISTAL RADIUS FRACTURE;  Surgeon: Juanell Fairly, MD;  Location: [facility] ORS;  Service: Orthopedics;  Laterality: Right;   PACEMAKER IMPLANT N/A 01/10/2020   Procedure: PACEMAKER IMPLANT;  Surgeon: Regan Lemming, MD;  Location: [facility] INVASIVE CV LAB;  Service: Cardiovascular;  Laterality: N/A;   PARTIAL HYSTERECTOMY     bleeding, ovaries in place.     THYROID SURGERY  1992   FOR THYROID CANCER   TONSILLECTOMY     Family History  Problem Relation Age of Onset   Heart disease Mother    Cancer Sister        breast   Cancer Brother        lung    Breast cancer Neg Hx    Social History   Socioeconomic History   Marital status: Widowed    Spouse name: Not on file   Number of children: Not on file   Years of education:  Not on file   Highest education level: Not on file  Occupational History   Not on file  Tobacco Use   Smoking status: Never   Smokeless tobacco: Never   Tobacco comments:    never  Vaping Use   Vaping Use: Never used  Substance and Sexual Activity   Alcohol use: Not Currently    Comment: once in a while   Drug use: No   Sexual activity: Never  Other Topics Concern   Not on file  Social History Narrative   Independent and baseline. Lives by herself   Social Determinants of Health   Financial Resource Strain: Low Risk  (01/30/2023)   Overall Financial Resource Strain (CARDIA)    Difficulty of Paying Living Expenses: Not hard at all  Food Insecurity: No Food Insecurity (01/30/2023)   Hunger Vital Sign    Worried About Running Out of Food in the Last Year: Never true    Ran Out of Food in the Last Year: Never true  Transportation Needs: No Transportation Needs (01/30/2023)   PRAPARE - Administrator, Civil Service (Medical): No    Lack of Transportation (Non-Medical): No  Physical Activity: Sufficiently Active (02/08/2022)   Exercise Vital Sign    Days of Exercise per Week: 4 days    Minutes of Exercise per Session: 40 min  Stress: No Stress Concern Present (01/30/2023)   [employer] of Occupational Health - Occupational Stress Questionnaire    Feeling of Stress : Not at all  Social Connections: Socially Integrated (01/30/2023)   Social Connection and Isolation Panel [NHANES]    Frequency of Communication with Friends and Family: More than three times a week    Frequency of Social Gatherings with Friends and Family: More than three times a week    Attends Religious Services: More than 4 times per year    Active Member of [employer] or Organizations: Yes    Attends Engineer, structural: More than 4 times per year    Marital Status: Married     Review of Systems  Constitutional:  Negative for appetite change and unexpected weight change.  HENT:  Negative for  sinus pressure and sore throat.   Respiratory:  Positive for cough. Negative for chest tightness and shortness of breath.   Cardiovascular:  Negative for chest pain and palpitations.  Gastrointestinal:  Negative for abdominal pain, diarrhea, nausea and vomiting.  Genitourinary:  Negative for difficulty urinating and dysuria.  Musculoskeletal:  Negative for joint swelling and myalgias.  Skin:  Negative for color change and  rash.  Neurological:  Negative for dizziness and headaches.  Psychiatric/Behavioral:  Negative for agitation and dysphoric mood.        Objective:     BP 112/78 (BP Location: Left Arm, Patient Position: Sitting, Cuff Size: Normal)   Pulse 82   Temp 97.8 F (36.6 C) (Oral)   Ht 5\' 5"  (1.651 m)   Wt 167 lb 6.4 oz (75.9 kg)   SpO2 95%   BMI 27.86 kg/m  Wt Readings from Last 3 Encounters:  02/17/23 168 lb 3.2 oz (76.3 kg)  02/16/23 167 lb 6.4 oz (75.9 kg)  02/07/23 163 lb 14.4 oz (74.3 kg)    Physical Exam Vitals reviewed.  Constitutional:      General: She is not in acute distress.    Appearance: Normal appearance.  HENT:     Head: Normocephalic and atraumatic.     Right Ear: External ear normal.     Left Ear: External ear normal.  Eyes:     General: No scleral icterus.       Right eye: No discharge.        Left eye: No discharge.     Conjunctiva/sclera: Conjunctivae normal.  Neck:     Thyroid: No thyromegaly.  Cardiovascular:     Rate and Rhythm: Normal rate and regular rhythm.  Pulmonary:     Effort: No respiratory distress.     Breath sounds: Normal breath sounds. No wheezing.  Abdominal:     General: Bowel sounds are normal.     Palpations: Abdomen is soft.     Tenderness: There is no abdominal tenderness.  Musculoskeletal:        General: No swelling or tenderness.     Cervical back: Neck supple. No tenderness.  Lymphadenopathy:     Cervical: No cervical adenopathy.  Skin:    Findings: No erythema or rash.  Neurological:     Mental  Status: She is alert.  Psychiatric:        Mood and Affect: Mood normal.        Behavior: Behavior normal.      Outpatient Encounter Medications as of 02/16/2023  Medication Sig   acetaminophen (TYLENOL) 650 MG CR tablet Take 650 mg by mouth every 8 (eight) hours as needed for pain.   amiodarone (PACERONE) 200 MG tablet TAKE 1 TABLET BY MOUTH DAILY   cetirizine (ZYRTEC) 5 MG tablet Take 1 tablet (5 mg total) by mouth daily.   cholecalciferol (VITAMIN D3) 25 MCG (1000 UNIT) tablet Take 2,000 Units by mouth daily.   DULoxetine (CYMBALTA) 60 MG capsule TAKE ONE CAPSULE AT BEDTIME   ELIQUIS 5 MG TABS tablet TAKE ONE TABLET BY MOUTH TWICE DAILY   furosemide (LASIX) 20 MG tablet Take 2 tablets (40 mg total) by mouth daily. (Patient taking differently: Take 20 mg by mouth daily.)   gabapentin (NEURONTIN) 300 MG capsule Take 2 capsules (600 mg total) by mouth at bedtime.   levothyroxine (SYNTHROID) 125 MCG tablet Take 1 tablet (125 mcg total) by mouth daily.   lovastatin (MEVACOR) 40 MG tablet TAKE 1 TABLET BY MOUTH DAILY (Patient taking differently: Take 40 mg by mouth at bedtime.)   metoprolol tartrate (LOPRESSOR) 50 MG tablet TAKE 1.5 TABLETS BY MOUTH TWICE DAILY. (Patient taking differently: Take 75 mg by mouth 2 (two) times daily.)   Multiple Vitamins-Minerals (PRESERVISION AREDS 2+MULTI VIT PO) Take 1 tablet by mouth 2 (two) times daily.   nystatin (MYCOSTATIN/NYSTOP) powder Apply 1 Application topically 2 (two) times  daily.   nystatin cream (MYCOSTATIN) Apply 1 Application topically 2 (two) times daily.   omeprazole (PRILOSEC) 20 MG capsule TAKE 1 CAPSULE BY MOUTH ONCE DAILY   Polyethyl Glycol-Propyl Glycol (SYSTANE OP) Place 1 drop into both eyes daily as needed (for dry eyes).   polyethylene glycol powder (GLYCOLAX/MIRALAX) 17 GM/SCOOP powder Take 17 g by mouth daily. (Patient taking differently: Take 17 g by mouth as needed.)   potassium chloride (KLOR-CON M) 10 MEQ tablet Take 1 tablet  (10 mEq total) by mouth daily.   spironolactone (ALDACTONE) 25 MG tablet Take 25 mg by mouth daily.   triamcinolone cream (KENALOG) 0.1 % Apply 1 application. topically 2 (two) times daily.   White Petrolatum-Mineral Oil (GENTEAL TEARS NIGHT-TIME OP) Place 1 application into both eyes at bedtime. Night time ointment 3.5g   [DISCONTINUED] albuterol (VENTOLIN HFA) 108 (90 Base) MCG/ACT inhaler Inhale 2 puffs into the lungs every 6 (six) hours as needed for wheezing or shortness of breath. (Patient not taking: Reported on 01/30/2023)   No facility-administered encounter medications on file as of 02/16/2023.     Medications reconciled with Ms Elnoria Howard.   Lab Results  Component Value Date   WBC 6.6 02/16/2023   HGB 12.1 02/16/2023   HCT 39.1 02/16/2023   PLT 348.0 02/16/2023   GLUCOSE 99 02/16/2023   CHOL 177 11/28/2022   TRIG 177.0 (H) 11/28/2022   HDL 42.30 11/28/2022   LDLDIRECT 90.0 03/15/2021   LDLCALC 99 11/28/2022   ALT 23 02/04/2023   AST 29 02/04/2023   NA 141 02/16/2023   K 4.7 02/16/2023   CL 101 02/16/2023   CREATININE 1.01 02/16/2023   BUN 16 02/16/2023   CO2 33 (H) 02/16/2023   TSH 1.19 12/26/2022   INR 1.5 (H) 11/21/2018   HGBA1C 6.5 11/28/2022    DG Chest 2 View  Result Date: 02/04/2023 CLINICAL DATA:  Shortness of breath EXAM: CHEST - 2 VIEW COMPARISON:  November 08, 2022 FINDINGS: The cardiomediastinal silhouette is stable. No pneumothorax. No nodules or masses. Stable pacemaker. Coarsened lung markings and patchy opacities bilaterally. No other acute abnormalities are identified. IMPRESSION: Patchy bilateral pulmonary opacities are favored to represent an infectious process. Asymmetric pulmonary edema is another possibility. No other acute abnormalities. Electronically Signed   By: Gerome Sam III M.D.   On: 02/04/2023 14:44       Assessment & Plan:  Hypercholesterolemia Assessment & Plan: On lovastatin.  Continue diet and exercise.  Follow fasting profile and  liver panel.    Essential hypertension Assessment & Plan: On metoprolol and lasix as outlined. Marland Kitchenalso taking spironolactone.   Blood pressure as outlined.  Continue current medication regimen. Hold on making changes.   Follow pressures.  Follow metabolic panel.   Orders: -     Basic metabolic panel  Anemia, unspecified type Assessment & Plan: Hgb 10.6 - recent hospitalization.  Recheck cbc, B12 and iron studies today.   Orders: -     CBC with Differential/Platelet -     Vitamin B12 -     IBC + Ferritin  Anxiety Assessment & Plan: Continues on cymbalta. Stable.    Aortic atherosclerosis (HCC) Assessment & Plan: On lovastatin.    Paroxysmal atrial fibrillation [facility]) Assessment & Plan: S/p pacemaker placement.  On eliquis and metoprolol.  Continues amiodarone. Continue metoprolol 75mg  bid.  Continue f/u with cardiology.  Currently stable.    Bronchiectasis without complication [facility]) Assessment & Plan: Saw Dr Aundria Rud - 10/2022.  Recommended a trial  of Breztri.  Previous CT - mosaicism/bronchiectasis.  Ordered high resolution CT scan.  Has not been performed.  Needs CT scan to further evaluate changes as outlined.  Some cough/congestion - persists.  Continue flutter valve.  Continue mucinex.  Albuterol inhaler prn.     Cough, unspecified type Assessment & Plan:  Previous CT - mosaicism/bronchiectasis.  Ordered high resolution CT scan.  Has not been performed.  Needs CT scan to further evaluate changes as outlined.  Some cough/congestion - persists.  Continue flutter valve.  Continue mucinex.  Albuterol inhaler prn.     Gastroesophageal reflux disease without esophagitis Assessment & Plan: Upper symptoms appear to be controlled on omeprazole.    Heart failure with mildly reduced ejection fraction (HFmrEF) [facility]) Assessment & Plan: Admitted 02/04/23 - 02/07/23 after presenting with productive cough, shortness of breath and bilateral leg swelling.Marland Kitchen She was admitted to the  hospital for acute exacerbation of chronic systolic CHF. 2D echo in April 2024 showed EF estimated at 40 to 45%, moderate LVH, indeterminate LV diastolic parameters, mild to moderate TR. Diuresed.  Continues on daily lasix now.  Also on spironolactone.  Breathing is better.  Has f/u in heart failure clinic tomorrow.  Check metabolic panel.  Continue current medication regimen.     History of breast cancer Assessment & Plan: 01/10/22 - [facility] I.  Evaluated by Dr Lemar Livings 01/20/22 - recommended yearly f/u - Dr Hazle Quant. Due f/u mammogram.    History of thyroid cancer Assessment & Plan: On thyroid replacement.  Follow tsh.    Hyperglycemia Assessment & Plan: Low carb diet and exercise.  Follow met b and a1c.     Mucopurulent chronic bronchitis (HCC) Assessment & Plan: Has seen pulmonary. Previous CT - mosaicism/bronchiectasis.  Ordered high resolution CT scan.  Has not been performed.  Needs CT scan to further evaluate changes as outlined.  Some cough/congestion - persists.  Continue flutter valve.  Continue mucinex.  Albuterol inhaler prn.     Obstructive sleep apnea Assessment & Plan: Continue cpap. Discussed using regularly.     Tachy-brady syndrome [facility]) Assessment & Plan: Seeing Dr Mariah Milling and EP.  On metoprolol 75mg  bid.  Also on amiodarone.  Follow thyroid and lung function.     Respiratory failure with hypoxia, unspecified chronicity (HCC) Assessment & Plan: Continues on 1 liter oxygen. Follow.  Has f/u with pulmonary in June to reassess need.    Other orders -     Nystatin; Apply 1 Application topically 2 (two) times daily.  Dispense: 30 g; Refill: 0 -     Nystatin; Apply 1 Application topically 2 (two) times daily.  Dispense: 60 g; Refill: 0     Dale Steen, MD

## 2023-02-17 ENCOUNTER — Encounter: Payer: Self-pay | Admitting: Family

## 2023-02-17 ENCOUNTER — Ambulatory Visit
Admission: RE | Admit: 2023-02-17 | Discharge: 2023-02-17 | Disposition: A | Discharge status: Home / Self Care | Payer: PPO | Source: Ambulatory Visit | Attending: Family | Admitting: Family

## 2023-02-17 ENCOUNTER — Ambulatory Visit (HOSPITAL_BASED_OUTPATIENT_CLINIC_OR_DEPARTMENT_OTHER): Payer: PPO | Admitting: Family

## 2023-02-17 ENCOUNTER — Other Ambulatory Visit (HOSPITAL_COMMUNITY): Payer: Self-pay

## 2023-02-17 VITALS — BP 132/76 | HR 84 | Wt 168.2 lb

## 2023-02-17 DIAGNOSIS — I10 Essential (primary) hypertension: Secondary | ICD-10-CM

## 2023-02-17 DIAGNOSIS — I110 Hypertensive heart disease with heart failure: Secondary | ICD-10-CM | POA: Diagnosis not present

## 2023-02-17 DIAGNOSIS — I5023 Acute on chronic systolic (congestive) heart failure: Secondary | ICD-10-CM | POA: Diagnosis not present

## 2023-02-17 DIAGNOSIS — Z7901 Long term (current) use of anticoagulants: Secondary | ICD-10-CM | POA: Diagnosis not present

## 2023-02-17 DIAGNOSIS — Z923 Personal history of irradiation: Secondary | ICD-10-CM

## 2023-02-17 DIAGNOSIS — E7800 Pure hypercholesterolemia, unspecified: Secondary | ICD-10-CM

## 2023-02-17 DIAGNOSIS — F32A Depression, unspecified: Secondary | ICD-10-CM

## 2023-02-17 DIAGNOSIS — G4733 Obstructive sleep apnea (adult) (pediatric): Secondary | ICD-10-CM

## 2023-02-17 DIAGNOSIS — Z9989 Dependence on other enabling machines and devices: Secondary | ICD-10-CM | POA: Diagnosis not present

## 2023-02-17 DIAGNOSIS — I5022 Chronic systolic (congestive) heart failure: Secondary | ICD-10-CM | POA: Diagnosis not present

## 2023-02-17 DIAGNOSIS — I480 Paroxysmal atrial fibrillation: Secondary | ICD-10-CM | POA: Insufficient documentation

## 2023-02-17 DIAGNOSIS — J479 Bronchiectasis, uncomplicated: Secondary | ICD-10-CM | POA: Diagnosis not present

## 2023-02-17 DIAGNOSIS — I499 Cardiac arrhythmia, unspecified: Secondary | ICD-10-CM | POA: Diagnosis not present

## 2023-02-17 DIAGNOSIS — F419 Anxiety disorder, unspecified: Secondary | ICD-10-CM

## 2023-02-17 DIAGNOSIS — D689 Coagulation defect, unspecified: Secondary | ICD-10-CM | POA: Diagnosis not present

## 2023-02-17 DIAGNOSIS — K219 Gastro-esophageal reflux disease without esophagitis: Secondary | ICD-10-CM

## 2023-02-17 DIAGNOSIS — Z950 Presence of cardiac pacemaker: Secondary | ICD-10-CM | POA: Diagnosis not present

## 2023-02-17 DIAGNOSIS — R059 Cough, unspecified: Secondary | ICD-10-CM | POA: Diagnosis not present

## 2023-02-17 DIAGNOSIS — M1990 Unspecified osteoarthritis, unspecified site: Secondary | ICD-10-CM | POA: Diagnosis not present

## 2023-02-17 DIAGNOSIS — I4819 Other persistent atrial fibrillation: Secondary | ICD-10-CM | POA: Diagnosis not present

## 2023-02-17 DIAGNOSIS — Z9981 Dependence on supplemental oxygen: Secondary | ICD-10-CM

## 2023-02-17 DIAGNOSIS — Z853 Personal history of malignant neoplasm of breast: Secondary | ICD-10-CM

## 2023-02-17 DIAGNOSIS — E039 Hypothyroidism, unspecified: Secondary | ICD-10-CM | POA: Diagnosis not present

## 2023-02-17 NOTE — Progress Notes (Signed)
   02/17/23 1238  ReDS Vest / Clip  Station Marker A  Ruler Value 31  ReDS Value Range < 36  ReDS Actual Value 32

## 2023-02-17 NOTE — Progress Notes (Deleted)
.  hf

## 2023-02-17 NOTE — Progress Notes (Signed)
[facility] HEART FAILURE CLINIC - Pharmacist Note  Kerri Mills is a 89 y.o. female with HFmrEF (EF 41-49%) presenting to the Heart Failure Clinic to establish care. Patient is here with her daughter today. She reports feeling overwhelmed with the new diagnosis of heart failure in the setting of multiple recent hospital admissions. We discussed the role of her new medications in heart failure management as well as how to assess volume status at home. Patient and daughter were introduced to GDMT and were reassured that we will attempt to add medications as able per patient's BP and renal function with consideration for time to process this new diagnosis. They were amenable to this idea and endorsed willingness to do what is needed to promote better heart function and ability to perform ADLs. Patient reports slight leg swelling and some shortness of breath on exertion. She denied any issues with appetite. We discussed that this assessment was to evaluate fluid status and this may be done at home by the patient to self monitor for volume overload. Patient reported that she was given a weight log at her most recent discharge and she planned to start using it soon. An additional weight log was provided in clinic today.  Recent ED Visit (past 6 months):  Date: 02/04/2023, CC: SOB Date: 11/28/2022, CC: fall Date: 10/11/2022, CC: cough  Guideline-Directed Medical Therapy/Evidence Based Medicine ACE/ARB/ARNI: none Beta Blocker: Metoprolol tartrate 75 mg twice daily Aldosterone Antagonist: Spironolactone 25 mg daily Diuretic: Furosemide 20 mg daily SGLT2i:  none  Adherence Assessment Do you ever forget to take your medication? [] Yes [x] No  Do you ever skip doses due to side effects? [] Yes [x] No  Do you have trouble affording your medicines? [] Yes [x] No  Are you ever unable to pick up your medication due to transportation difficulties? [] Yes [x] No  Do you ever stop taking your medications because you don't  believe they are helping? [] Yes [x] No  Do you check your weight daily? [x] Yes [] No  Adherence strategy: pill box Barriers to obtaining medications: none reported  Diagnostics ECHO: Date 01/24/2023, EF 40-45%, GHK, moderate LVH  Vitals    02/16/2023   10:58 AM 02/07/2023    9:38 AM 02/07/2023    7:51 AM  Vitals with BMI  Height 5\' 5"     Weight 167 lbs 6 oz 163 lbs 14 oz   BMI 27.86 27.27   Systolic 112 102 161  Diastolic 78 58 83  Pulse 82 80 83     Recent Labs    Latest Ref Rng & Units 02/16/2023   12:04 PM 02/07/2023    5:25 AM 02/06/2023    4:28 AM  BMP  Glucose 70 - 99 mg/dL 99  096  045   BUN 6 - 23 mg/dL 16  16  20    Creatinine 0.40 - 1.20 mg/dL 4.09  8.11  9.14   Sodium 135 - 145 mEq/L 141  138  138   Potassium 3.5 - 5.1 mEq/L 4.7  3.8  3.8   Chloride 96 - 112 mEq/L 101  99  100   CO2 19 - 32 mEq/L 33  32  29   Calcium 8.4 - 10.5 mg/dL 9.5  9.1  9.2     Past Medical History Past Medical History:  Diagnosis Date   Allergy    Anxiety    Arthritis    Atypical chest pain    a. 10/2018 MV: small, fixed apical defect possibly 2/2 attenuation artifact. No ischemia.  EF 68%.  Clotting disorder (HCC)    Colitis    Depression    Diverticulitis 2013   Dysrhythmia    Gastric ulcer    GERD (gastroesophageal reflux disease)    History of echocardiogram    a. 09/2019 Echo: EF 55-60%, mod LVH. Mildly dil LA. Triv MR/TR.   Hypercholesterolemia    Hypertension    Hypothyroidism    Infiltrating lobular carcinoma of left breast 2011   T2,N0, ER: 90%; PR 0%; Her 2 neu not amplified. [facility]).   Melanoma (HCC) 1997   Melanoma in situ of upper extremity (HCC) 03/19/2011   Persistent atrial fibrillation (HCC)    a. CHADS2VASc => 4 (HTN, age x 2, female)   Personal history of radiation therapy 2011   BREAST CA   Presence of permanent cardiac pacemaker    Seroma    HISTORY OF LFT BREAST   Sleep apnea    Thyroid cancer (HCC) 1992    Plan Continue regimen  as directed by NP ReDS today 32 Consider addition of Entresto and/or SGLT2 inhibitor [place] Pepper, Farxiga) as renal function and BP allow (copays each $47) Annual echo due 12/2023  Time spent: 20 minutes  Celene Squibb, PharmD PGY1 Pharmacy Resident 02/17/2023 11:36 AM

## 2023-02-17 NOTE — Progress Notes (Signed)
Advanced Heart Failure Clinic Note   PCP: Dale Cayuse, MD (last seen yesterday) PCP-Cardiologist: Julien Nordmann, MD (last seen 11/23)  HPI:  Kerri Mills is a 89 y/o female with a history of thyroid/ breast cancer, hyperlipidemia, HTN, hypothyroidism, anxiety, clotting disorder, depression, tachy-brady syndrome s/p permanent pacemaker (04/21) , GERD, PVC's, atrial fibrillation, macular degeneration, OSA and chronic heart failure. Recent nuclear stress test on 02/03/2023 did not show any evidence of reversible ischemia. Recent pacemaker check on 01/27/2023 showed normal device function.  Admitted 02/04/23 due to productive cough, shortness of breath and bilateral leg swelling due to a/c heart failure exacerbation. IV diuresed. Placed on oxygen but unable to be weaned off of it. Admitted 11/28/22 due to right wrist fracture. No surgery needed. Admitted 10/11/22 due to influenza.   Echo 01/24/23: EF 40-45% along with moderate LVH, mildly elevated PA pressure of 42.9 mmHg, mild/moderate TR and borderline dilatation of the ascending aorta, measuring 37.  Echo 10/21/19: EF 55-60% along with moderate LVH, mild LAE and trivial MR.   She presents today for her initial HF visit with a chief complaint of moderate SOB with minimal exertion. Chronic in nature although seems to be slowly improving since recent admission. Has associated productive cough, fatigue, intermittent chest heaviness, head congestion, intermittent dizziness/ feeling unbalanced, abdominal distention and pedal edema along with this. Denies chest pain or weight gain.   Biggest complaint today is of this frequent, loose productive cough that she's had since before she was discharged from the hospital. Generally coughs up clear / white sputum but has recently noticed that there's a yellowish color to the sputum.   Admits to feeling overwhelmed with the medications and heart failure diagnosis. Has been admitted 3 times since 01/24 and doesn't feel  like she gets recovered before something else happens. Is concerned that her heart function may not improve.    Review of Systems: [y] = yes, [ ]  = no   General: Weight gain [ ] ; Weight loss [ ] ; Anorexia [ ] ; Fatigue [ y]; Fever [ ] ; Chills [ ] ; Weakness [ ]   Cardiac: Chest pain/pressure [ ] ; Resting SOB [ ] ; Exertional SOB [ y]; Orthopnea [ ] ; Pedal Edema Cove.Etienne ]; Palpitations [ ] ; Syncope [ ] ; Presyncope [ ] ; Paroxysmal nocturnal dyspnea[ ]   Pulmonary: Cough [ y]; Wheezing[ ] ; Hemoptysis[ ] ; Sputum Cove.Etienne ]; Snoring [ ]   GI: Vomiting[ ] ; Dysphagia[ ] ; Melena[ ] ; Hematochezia [ ] ; Heartburn[ ] ; Abdominal pain [ ] ; Constipation [ ] ; Diarrhea [ ] ; BRBPR [ ]   GU: Hematuria[ ] ; Dysuria [ ] ; Nocturia[ ]   Vascular: Pain in legs with walking [ ] ; Pain in feet with lying flat [ ] ; Non-healing sores [ ] ; Stroke [ ] ; TIA [ ] ; Slurred speech [ ] ;  Neuro: Headaches[ ] ; Vertigo[ ] ; Seizures[ ] ; Paresthesias[ ] ;Blurred vision Cove.Etienne ]; Diplopia [ ] ; Vision changes [ ]   Ortho/Skin: Arthritis [ ] ; Joint pain [ ] ; Muscle pain [ ] ; Joint swelling [ ] ; Back Pain [ ] ; Rash [ ]   Psych: Depression[ ] ; Anxiety[ ]   Heme: Bleeding problems [ ] ; Clotting disorders [ ] ; Anemia [ ]   Endocrine: Diabetes [ ] ; Thyroid dysfunction[ y]   Past Medical History:  Diagnosis Date   Allergy    Anxiety    Arthritis    Atypical chest pain    a. 10/2018 MV: small, fixed apical defect possibly 2/2 attenuation artifact. No ischemia.  EF 68%.   Clotting disorder (HCC)    Colitis  Depression    Diverticulitis 2013   Dysrhythmia    Gastric ulcer    GERD (gastroesophageal reflux disease)    History of echocardiogram    a. 09/2019 Echo: EF 55-60%, mod LVH. Mildly dil LA. Triv MR/TR.   Hypercholesterolemia    Hypertension    Hypothyroidism    Infiltrating lobular carcinoma of left breast 2011   T2,N0, ER: 90%; PR 0%; Her 2 neu not amplified. [facility]).   Melanoma (HCC) 1997   Melanoma in situ of upper extremity (HCC)  03/19/2011   Persistent atrial fibrillation (HCC)    a. CHADS2VASc => 4 (HTN, age x 2, female)   Personal history of radiation therapy 2011   BREAST CA   Presence of permanent cardiac pacemaker    Seroma    HISTORY OF LFT BREAST   Sleep apnea    Thyroid cancer (HCC) 1992    Current Outpatient Medications  Medication Sig Dispense Refill   acetaminophen (TYLENOL) 650 MG CR tablet Take 650 mg by mouth every 8 (eight) hours as needed for pain.     albuterol (VENTOLIN HFA) 108 (90 Base) MCG/ACT inhaler Inhale 2 puffs into the lungs every 6 (six) hours as needed for wheezing or shortness of breath. (Patient not taking: Reported on 01/30/2023) 18 g 2   amiodarone (PACERONE) 200 MG tablet TAKE 1 TABLET BY MOUTH DAILY 30 tablet 2   cetirizine (ZYRTEC) 5 MG tablet Take 1 tablet (5 mg total) by mouth daily. 30 tablet 6   cholecalciferol (VITAMIN D3) 25 MCG (1000 UNIT) tablet Take 2,000 Units by mouth daily.     DULoxetine (CYMBALTA) 60 MG capsule TAKE ONE CAPSULE AT BEDTIME 90 capsule 1   ELIQUIS 5 MG TABS tablet TAKE ONE TABLET BY MOUTH TWICE DAILY 180 tablet 1   furosemide (LASIX) 20 MG tablet Take 2 tablets (40 mg total) by mouth daily.     gabapentin (NEURONTIN) 300 MG capsule Take 2 capsules (600 mg total) by mouth at bedtime. 180 capsule 1   levothyroxine (SYNTHROID) 125 MCG tablet Take 1 tablet (125 mcg total) by mouth daily. 90 tablet 3   lovastatin (MEVACOR) 40 MG tablet TAKE 1 TABLET BY MOUTH DAILY (Patient taking differently: Take 40 mg by mouth at bedtime.) 90 tablet 3   metoprolol tartrate (LOPRESSOR) 50 MG tablet TAKE 1.5 TABLETS BY MOUTH TWICE DAILY. (Patient taking differently: Take 75 mg by mouth 2 (two) times daily.) 270 tablet 0   Multiple Vitamins-Minerals (PRESERVISION AREDS 2+MULTI VIT PO) Take 1 tablet by mouth 2 (two) times daily.     nystatin (MYCOSTATIN/NYSTOP) powder Apply 1 Application topically 2 (two) times daily. 60 g 0   nystatin cream (MYCOSTATIN) Apply 1 Application  topically 2 (two) times daily. 30 g 0   omeprazole (PRILOSEC) 20 MG capsule TAKE 1 CAPSULE BY MOUTH ONCE DAILY 90 capsule 3   Polyethyl Glycol-Propyl Glycol (SYSTANE OP) Place 1 drop into both eyes daily as needed (for dry eyes).     polyethylene glycol powder (GLYCOLAX/MIRALAX) 17 GM/SCOOP powder Take 17 g by mouth daily. 850 g 1   potassium chloride (KLOR-CON M) 10 MEQ tablet Take 1 tablet (10 mEq total) by mouth daily.     spironolactone (ALDACTONE) 25 MG tablet Take 25 mg by mouth daily.     triamcinolone cream (KENALOG) 0.1 % Apply 1 application. topically 2 (two) times daily. 30 g 0   White Petrolatum-Mineral Oil (GENTEAL TEARS NIGHT-TIME OP) Place 1 application into both eyes at  bedtime. Night time ointment 3.5g     No current facility-administered medications for this visit.    Allergies  Allergen Reactions   Lipitor [Atorvastatin Calcium] Other (See Comments)    Stiffness & soreness   Penicillins Rash      Social History   Socioeconomic History   Marital status: Widowed    Spouse name: Not on file   Number of children: Not on file   Years of education: Not on file   Highest education level: Not on file  Occupational History   Not on file  Tobacco Use   Smoking status: Never   Smokeless tobacco: Never   Tobacco comments:    never  Vaping Use   Vaping Use: Never used  Substance and Sexual Activity   Alcohol use: Not Currently    Comment: once in a while   Drug use: No   Sexual activity: Never  Other Topics Concern   Not on file  Social History Narrative   Independent and baseline. Lives by herself   Social Determinants of Health   Financial Resource Strain: Low Risk  (01/30/2023)   Overall Financial Resource Strain (CARDIA)    Difficulty of Paying Living Expenses: Not hard at all  Food Insecurity: No Food Insecurity (01/30/2023)   Hunger Vital Sign    Worried About Running Out of Food in the Last Year: Never true    Ran Out of Food in the Last Year: Never  true  Transportation Needs: No Transportation Needs (01/30/2023)   PRAPARE - Administrator, Civil Service (Medical): No    Lack of Transportation (Non-Medical): No  Physical Activity: Sufficiently Active (02/08/2022)   Exercise Vital Sign    Days of Exercise per Week: 4 days    Minutes of Exercise per Session: 40 min  Stress: No Stress Concern Present (01/30/2023)   [employer] of Occupational Health - Occupational Stress Questionnaire    Feeling of Stress : Not at all  Social Connections: Socially Integrated (01/30/2023)   Social Connection and Isolation Panel [NHANES]    Frequency of Communication with Friends and Family: More than three times a week    Frequency of Social Gatherings with Friends and Family: More than three times a week    Attends Religious Services: More than 4 times per year    Active Member of [employer] or Organizations: Yes    Attends Banker Meetings: More than 4 times per year    Marital Status: Married  Catering manager Violence: Not At Risk (01/30/2023)   Humiliation, Afraid, Rape, and Kick questionnaire    Fear of Current or Ex-Partner: No    Emotionally Abused: No    Physically Abused: No    Sexually Abused: No      Family History  Problem Relation Age of Onset   Heart disease Mother    Cancer Sister        breast   Cancer Brother        lung    Breast cancer Neg Hx    Vitals:   02/17/23 1140  BP: 132/76  Pulse: 84  SpO2: 95%  Weight: 168 lb 3.2 oz (76.3 kg)   Wt Readings from Last 3 Encounters:  02/17/23 168 lb 3.2 oz (76.3 kg)  02/16/23 167 lb 6.4 oz (75.9 kg)  02/07/23 163 lb 14.4 oz (74.3 kg)   Lab Results  Component Value Date   CREATININE 1.01 02/16/2023   CREATININE 0.85 02/07/2023   CREATININE 1.03 (  H) 02/06/2023   PHYSICAL EXAM: General:  Well appearing. No respiratory difficulty HEENT: normal Neck: supple. no JVD. No lymphadenopathy or thyromegaly appreciated. Cor: PMI nondisplaced. Regular rate &  rhythm. No rubs, gallops or murmurs. Lungs: clear Abdomen: soft, nontender, nondistended. No hepatosplenomegaly. No bruits or masses.  Extremities: no cyanosis, clubbing, rash, trace pitting edema bilateral lower legs Neuro: alert & oriented x 3, cranial nerves grossly intact. moves all 4 extremities w/o difficulty. Affect pleasant.  ECG: not done  ReDs: 32%   ASSESSMENT & PLAN:  1: NICM with mildly reduced ejection fraction- - likely due to tachy-brady syndrome - NYHA class III - euvolemic today - weighing daily; reminded to call for an overnight weight gain of > 2 pounds or a weekly weight gain of > 5 pounds - not adding salt and does like to cook for herself at home in her own apt; made pinto beans and didn't add any salt but did use salsa afterwards - ReDs clip reading today was 32% - continue furosemide 20mg  daily/ potassium daily - continue metoprolol tartrate 75mg  BID - continue spironolactone 25mg  daily - discussed adding SGLT2 at next visit as she's very overwhelmed right now - could also add entresto in the future - saw cardiology Mariah Milling) 11/23 - wearing compression socks daily with removal at bedtime - due to her loose sounding, barky cough, will get CXR today - BNP 02/04/23 was 1168.6  2: HTN:- - BP 132/76 - saw PCP Lorin Picket) 05/24 - BMP 02/16/23 showed sodium 141, potassium 4.7, creatinine 1.01 & GFR 49.31  3: Atrial fibrillation- - tachy-brady syndrome s/p permanent pacemaker (04/21) - saw EP provider (Riddle) 05/24 - continue amiodarone 200mg  daily - continue apixaban 5mg  BID  4: OSA- - wearing CPAP nightly - has oxygen @ 1L PRN  Return in 1 month, sooner if needed.   Delma Freeze, FNP 02/17/23

## 2023-02-17 NOTE — Patient Instructions (Signed)
Go to the [facility] registration desk to get registered for your chest xray.

## 2023-02-21 ENCOUNTER — Encounter: Payer: Self-pay | Admitting: Internal Medicine

## 2023-02-21 NOTE — Assessment & Plan Note (Signed)
On lovastatin.  Continue diet and exercise.  Follow fasting profile and liver panel.  ?

## 2023-02-21 NOTE — Assessment & Plan Note (Signed)
On lovastatin 

## 2023-02-21 NOTE — Assessment & Plan Note (Signed)
On thyroid replacement.  Follow tsh.  

## 2023-02-21 NOTE — Assessment & Plan Note (Signed)
01/10/22 - Birads I.  Evaluated by Dr Byrnett 01/20/22 - recommended yearly f/u - Dr Cintron-Diaz. Due f/u mammogram.  

## 2023-02-21 NOTE — Assessment & Plan Note (Signed)
Saw Dr Aundria Rud - 10/2022.  Recommended a trial of Breztri.  Previous CT - mosaicism/bronchiectasis.  Ordered high resolution CT scan.  Has not been performed.  Needs CT scan to further evaluate changes as outlined.  Some cough/congestion - persists.  Continue flutter valve.  Continue mucinex.  Albuterol inhaler prn.

## 2023-02-21 NOTE — Assessment & Plan Note (Signed)
Upper symptoms appear to be controlled on omeprazole.  

## 2023-02-21 NOTE — Assessment & Plan Note (Signed)
S/p pacemaker placement.  On eliquis and metoprolol.  Continues amiodarone. Continue metoprolol 75mg bid.  Continue f/u with cardiology.  Currently stable.  

## 2023-02-21 NOTE — Assessment & Plan Note (Signed)
Previous CT - mosaicism/bronchiectasis.  Ordered high resolution CT scan.  Has not been performed.  Needs CT scan to further evaluate changes as outlined.  Some cough/congestion - persists.  Continue flutter valve.  Continue mucinex.  Albuterol inhaler prn.

## 2023-02-21 NOTE — Assessment & Plan Note (Signed)
Continue cpap. Discussed using regularly.   

## 2023-02-21 NOTE — Assessment & Plan Note (Signed)
Continues on cymbalta. Stable.  

## 2023-02-21 NOTE — Assessment & Plan Note (Addendum)
Has seen pulmonary. Previous CT - mosaicism/bronchiectasis.  Ordered high resolution CT scan.  Has not been performed.  Needs CT scan to further evaluate changes as outlined.  Some cough/congestion - persists.  Continue flutter valve.  Continue mucinex.  Albuterol inhaler prn.

## 2023-02-21 NOTE — Assessment & Plan Note (Signed)
Hgb 10.6 - recent hospitalization.  Recheck cbc, B12 and iron studies today.

## 2023-02-21 NOTE — Assessment & Plan Note (Signed)
Seeing Dr Mariah Milling and EP.  On metoprolol 75mg  bid.  Also on amiodarone.  Follow thyroid and lung function.

## 2023-02-21 NOTE — Assessment & Plan Note (Signed)
On metoprolol and lasix as outlined. Marland Kitchenalso taking spironolactone.   Blood pressure as outlined.  Continue current medication regimen. Hold on making changes.   Follow pressures.  Follow metabolic panel.

## 2023-02-21 NOTE — Assessment & Plan Note (Signed)
Admitted 02/04/23 - 02/07/23 after presenting with productive cough, shortness of breath and bilateral leg swelling.Marland Kitchen She was admitted to the hospital for acute exacerbation of chronic systolic CHF. 2D echo in April 2024 showed EF estimated at 40 to 45%, moderate LVH, indeterminate LV diastolic parameters, mild to moderate TR. Diuresed.  Continues on daily lasix now.  Also on spironolactone.  Breathing is better.  Has f/u in heart failure clinic tomorrow.  Check metabolic panel.  Continue current medication regimen.

## 2023-02-21 NOTE — Assessment & Plan Note (Signed)
Low carb diet and exercise.  Follow met b and a1c.   

## 2023-02-21 NOTE — Assessment & Plan Note (Signed)
Continues on 1 liter oxygen. Follow.  Has f/u with pulmonary in June to reassess need.

## 2023-02-22 ENCOUNTER — Other Ambulatory Visit: Payer: Self-pay

## 2023-02-22 DIAGNOSIS — R944 Abnormal results of kidney function studies: Secondary | ICD-10-CM

## 2023-02-23 ENCOUNTER — Other Ambulatory Visit: Payer: Self-pay | Admitting: Internal Medicine

## 2023-02-24 ENCOUNTER — Telehealth: Payer: Self-pay

## 2023-02-24 NOTE — Telephone Encounter (Signed)
Spoke with patient and per Kerri Mills, informed her that the chest x-ray she had completed on 02/17/23 has improved since the last one with resolution of pneumonia. Patient was grateful for the return phone.

## 2023-02-27 ENCOUNTER — Other Ambulatory Visit: Payer: Self-pay | Admitting: Cardiovascular Disease

## 2023-02-27 NOTE — Telephone Encounter (Signed)
Please review

## 2023-02-27 NOTE — Telephone Encounter (Signed)
Prescription refill request for Eliquis received. Indication:afib Last office visit:5/24 Scr:1.01  5/24 Age: 89 Weight:76.3  kg  Prescription refilled

## 2023-02-28 ENCOUNTER — Telehealth: Payer: Self-pay | Admitting: Internal Medicine

## 2023-02-28 NOTE — Telephone Encounter (Signed)
Patient aware of below.

## 2023-02-28 NOTE — Telephone Encounter (Signed)
In review with pt, we had discussed that given she is taking the aldactone daily and potassium wnl, we had discussed holding to see if could maintain level.  Remain with her current medication (lasix and aldactone). as she is doing for now.  Keep f/u lab appt.  Have her restart her potassium tomorrow and recheck lab as scheduled.

## 2023-02-28 NOTE — Telephone Encounter (Signed)
Irving Burton PT called in to check the status of a fax, that was faxed on Friday 5/31.?   She also wants to confirm if provider stopped her potassium chloride (KLOR-CON M) 10 MEQ tablet? Any questions, or concern, she's available @[phone], Ok to leave detail message.

## 2023-02-28 NOTE — Telephone Encounter (Signed)
Reviewed note- agree - I do not see where potassium has been stopped.  Per note, states continue daily lasix and potassium.

## 2023-02-28 NOTE — Telephone Encounter (Signed)
Noted. See me about this.

## 2023-02-28 NOTE — Telephone Encounter (Signed)
Kerri Mills last note states for patient to continue daily lasix and potassium for now. I do not see where anyone has stopped this medication. Just wanted to confirm with you prior to calling Irving Burton. I have faxed the orders she is waiting for from 5/31

## 2023-03-01 ENCOUNTER — Other Ambulatory Visit: Payer: Self-pay | Admitting: Cardiovascular Disease

## 2023-03-02 ENCOUNTER — Other Ambulatory Visit (INDEPENDENT_AMBULATORY_CARE_PROVIDER_SITE_OTHER): Payer: PPO

## 2023-03-02 ENCOUNTER — Other Ambulatory Visit: Payer: PPO

## 2023-03-02 DIAGNOSIS — R944 Abnormal results of kidney function studies: Secondary | ICD-10-CM

## 2023-03-02 LAB — BASIC METABOLIC PANEL
BUN: 15 mg/dL (ref 6–23)
CO2: 30 mEq/L (ref 19–32)
Calcium: 9.7 mg/dL (ref 8.4–10.5)
Chloride: 101 mEq/L (ref 96–112)
Creatinine, Ser: 0.9 mg/dL (ref 0.40–1.20)
GFR: 56.61 mL/min — ABNORMAL LOW (ref >60.00)
Glucose, Bld: 142 mg/dL — ABNORMAL HIGH (ref 70–99)
Potassium: 4.5 mEq/L (ref 3.5–5.1)
Sodium: 139 mEq/L (ref 135–145)

## 2023-03-06 ENCOUNTER — Ambulatory Visit: Payer: PPO | Admitting: Internal Medicine

## 2023-03-07 ENCOUNTER — Ambulatory Visit (INDEPENDENT_AMBULATORY_CARE_PROVIDER_SITE_OTHER): Payer: PPO | Admitting: Internal Medicine

## 2023-03-07 ENCOUNTER — Encounter: Payer: Self-pay | Admitting: Internal Medicine

## 2023-03-07 VITALS — BP 108/60 | HR 79 | Temp 97.1°F | Ht 65.0 in | Wt 168.2 lb

## 2023-03-07 DIAGNOSIS — J479 Bronchiectasis, uncomplicated: Secondary | ICD-10-CM | POA: Diagnosis not present

## 2023-03-07 DIAGNOSIS — J411 Mucopurulent chronic bronchitis: Secondary | ICD-10-CM | POA: Diagnosis not present

## 2023-03-07 NOTE — Patient Instructions (Addendum)
Continue CPAP as prescribed Excellent Job A+  Follow up Cardiology as prescribed   Avoid secondhand smoke Avoid SICK contacts Recommend  Masking  when appropriate Recommend Keep up-to-date with vaccinations

## 2023-03-07 NOTE — Progress Notes (Signed)
Tests May 04, 2021 that showed mild restriction with no significant airflow obstruction.  FEV1 78%, ratio 78, FVC 74%.  No significant bronchodilator response DLCO 76%. CT chest completed on May 04, 2021 which showed resolved right lower lobe nodule.  Areas of new nodularity measuring up to 8 mm in the periphery of the right lower lobe.  Mild bronchiectasis.  Micro and macro nodularity.  Incidental finding of a soft tissue mass in the left breast.   Chief complaint Follow-up hospitalization and ER visits Follow-up OSA  HPI: 89 year old female never smoker followed for chronic bronchitis and bronchiectasis Medical history significant for Breast cancer, thyroid cancer and melanoma .  Recent admission January 2024 for influenza pneumonia    03/07/2023  Follow-up chronic bronchitis and bronchiectasis Doing well overall January 2024 admission for influenza pneumonia   Previous COVID-19 infection July 2022 Pneumonia January 2024 May 2024 admitted for CHF exacerbation  Regarding her OSA on CPAP Excellent compliance report 04/2022 87% and 80% AutoCPAP 5-12  No exacerbation at this time No evidence of heart failure at this time No evidence or signs of infection at this time No respiratory distress No fevers, chills, nausea, vomiting, diarrhea No evidence of lower extremity edema No evidence hemoptysis    Tobacco History: Social History   Tobacco Use  Smoking Status Never  Smokeless Tobacco Never  Tobacco Comments   never   Counseling given: Not Answered Tobacco comments: never    Outpatient Medications Prior to Visit  Medication Sig Dispense Refill   acetaminophen (TYLENOL) 650 MG CR tablet Take 650 mg by mouth every 8 (eight) hours as needed for pain.     amiodarone (PACERONE) 200 MG tablet TAKE 1 TABLET BY MOUTH DAILY 30 tablet 2   cetirizine (ZYRTEC) 5 MG tablet Take 1 tablet (5 mg total) by mouth daily. 30 tablet 6   cholecalciferol (VITAMIN D3) 25 MCG  (1000 UNIT) tablet Take 2,000 Units by mouth daily.     DULoxetine (CYMBALTA) 60 MG capsule TAKE ONE CAPSULE AT BEDTIME 90 capsule 1   ELIQUIS 5 MG TABS tablet TAKE ONE TABLET BY MOUTH TWICE DAILY 180 tablet 1   furosemide (LASIX) 20 MG tablet Take 2 tablets (40 mg total) by mouth daily. (Patient taking differently: Take 20 mg by mouth daily.)     gabapentin (NEURONTIN) 300 MG capsule Take 2 capsules (600 mg total) by mouth at bedtime. 180 capsule 1   levothyroxine (SYNTHROID) 125 MCG tablet Take 1 tablet (125 mcg total) by mouth daily. 90 tablet 3   lovastatin (MEVACOR) 40 MG tablet TAKE 1 TABLET BY MOUTH DAILY (Patient taking differently: Take 40 mg by mouth at bedtime.) 90 tablet 3   metoprolol tartrate (LOPRESSOR) 50 MG tablet TAKE 1.5 TABLETS BY MOUTH TWICE DAILY. 270 tablet 0   Multiple Vitamins-Minerals (PRESERVISION AREDS 2+MULTI VIT PO) Take 1 tablet by mouth 2 (two) times daily.     nystatin (MYCOSTATIN/NYSTOP) powder Apply 1 Application topically 2 (two) times daily. 60 g 0   nystatin cream (MYCOSTATIN) Apply 1 Application topically 2 (two) times daily. 30 g 0   omeprazole (PRILOSEC) 20 MG capsule TAKE 1 CAPSULE BY MOUTH ONCE DAILY 90 capsule 3   Polyethyl Glycol-Propyl Glycol (SYSTANE OP) Place 1 drop into both eyes daily as needed (for dry eyes).     polyethylene glycol powder (GLYCOLAX/MIRALAX) 17 GM/SCOOP powder Take 17 g by mouth daily. (Patient taking differently: Take 17 g by mouth as needed.) 850 g 1  potassium chloride (KLOR-CON M) 10 MEQ tablet Take 1 tablet (10 mEq total) by mouth daily.     spironolactone (ALDACTONE) 25 MG tablet Take 25 mg by mouth daily.     triamcinolone cream (KENALOG) 0.1 % Apply 1 application. topically 2 (two) times daily. 30 g 0   White Petrolatum-Mineral Oil (GENTEAL TEARS NIGHT-TIME OP) Place 1 application into both eyes at bedtime. Night time ointment 3.5g     No facility-administered medications prior to visit.   BP 108/60 (BP Location: Left  Arm, Cuff Size: Normal)   Pulse 79   Temp (!) 97.1 F (36.2 C)   Ht 5\' 5"  (1.651 m)   Wt 168 lb 3.2 oz (76.3 kg)   SpO2 92%   BMI 27.99 kg/m   Review of Systems: Gen:  Denies  fever, sweats, chills weight loss  HEENT: Denies blurred vision, double vision, ear pain, eye pain, hearing loss, nose bleeds, sore throat Cardiac:  No dizziness, chest pain or heaviness, chest tightness,edema, No JVD Resp:   No cough, -sputum production, -shortness of breath,-wheezing, -hemoptysis,  Other:  All other systems negative   Physical Examination:   General Appearance: No distress  EYES PERRLA, EOM intact.   NECK Supple, No JVD Pulmonary: normal breath sounds, No wheezing.  CardiovascularNormal S1,S2.  No m/r/g.   Abdomen: Benign, Soft, non-tender. Neurology UE/LE 5/5 strength, no focal deficits Ext pulses intact, cap refill intact ALL OTHER ROS ARE NEGATIVE       ASSESSMENT AND PLAN 89 year old pleasant white female with history of chronic bronchitis and bronchiectasis with previous COVID-19 infection and recent influenza pneumonia infection Patient admitted to the hospital for CHF exacerbation in May 2024 was placed on oxygen therapy upon discharge, ambulating pulse oximetry in the office does not show significant hypoxia with exertion, no indication for oxygen at this time   Bronchiectasis without complication (HCC) No signs of infection at this time No indication for antibiotics or steroids Continue flutter valve as tolerated Previous CT scans reviewed in detail with patient Most current CT scan was in January 2020 for some groundglass opacifications along with the bronchiectasis Repeat CT chest not indicated at this time Recommend albuterol inhaler 1 to 2 puffs every 6 hours as needed Avoid secondhand smoke Avoid SICK contacts Recommend  Masking  when appropriate Recommend Keep up-to-date with vaccinations  Regarding OSA Continue CPAP on a daily basis Excellent compliance  report Patient use benefits from therapy AHI reduced to 2.1 Recommend wearing her CPAP during nap time Recommend checking for out nocturnal hypoxia on CPAP   Follow up with Primary Provider regarding abnormal breast finding .     Abnormal chest CT Right lung nodularity consider MAI or atypical pneumonia No significant symptoms at this time No significant changes from August 2023 CT scan CT chest January 2024 shows groundglass opacifications likely related to inflammatory reaction to influenza virus I do not see the need for repeat CT chest at this time   MEDICATION ADJUSTMENTS/LABS AND TESTS ORDERED: Continue CPAP as prescribed Recommend using every day Follow up Cardiology as prescribed Avoid secondhand smoke Avoid SICK contacts Recommend  Masking  when appropriate Recommend Keep up-to-date with vaccinations      CURRENT MEDICATIONS REVIEWED AT LENGTH WITH PATIENT TODAY   Patient  satisfied with Plan of action and management. All questions answered  Follow up  6 months  Total time spent 22 minutes   Lucie Leather, M.D.  [facility] Pulmonary & Critical Care Medicine  Medical Director [facility]  [facility] Medical Director [facility] Cardio-Pulmonary Department

## 2023-03-12 DIAGNOSIS — R0602 Shortness of breath: Secondary | ICD-10-CM | POA: Diagnosis not present

## 2023-03-12 DIAGNOSIS — I5022 Chronic systolic (congestive) heart failure: Secondary | ICD-10-CM | POA: Diagnosis not present

## 2023-03-12 DIAGNOSIS — J479 Bronchiectasis, uncomplicated: Secondary | ICD-10-CM | POA: Diagnosis not present

## 2023-03-12 DIAGNOSIS — J410 Simple chronic bronchitis: Secondary | ICD-10-CM | POA: Diagnosis not present

## 2023-03-13 ENCOUNTER — Other Ambulatory Visit: Payer: Self-pay | Admitting: Orthopedic Surgery

## 2023-03-13 ENCOUNTER — Telehealth: Payer: Self-pay | Admitting: *Deleted

## 2023-03-13 NOTE — Telephone Encounter (Signed)
I have s/w the pt and she has been scheduled for tele pre op appt add on 03/17/23 due to procedure date and med hold. Med rec and consent are done.      Patient Consent for Virtual Visit        Kerri Mills has provided verbal consent on 03/13/2023 for a virtual visit (video or telephone).   CONSENT FOR VIRTUAL VISIT FOR:  Kerri Mills  By participating in this virtual visit I agree to the following:  I hereby voluntarily request, consent and authorize [facility] and its employed or contracted physicians, physician assistants, nurse practitioners or other licensed health care professionals (the Practitioner), to provide me with telemedicine health care services (the "Services") as deemed necessary by the treating Practitioner. I acknowledge and consent to receive the Services by the Practitioner via telemedicine. I understand that the telemedicine visit will involve communicating with the Practitioner through live audiovisual communication technology and the disclosure of certain medical information by electronic transmission. I acknowledge that I have been given the opportunity to request an in-person assessment or other available alternative prior to the telemedicine visit and am voluntarily participating in the telemedicine visit.  I understand that I have the right to withhold or withdraw my consent to the use of telemedicine in the course of my care at any time, without affecting my right to future care or treatment, and that the Practitioner or I may terminate the telemedicine visit at any time. I understand that I have the right to inspect all information obtained and/or recorded in the course of the telemedicine visit and may receive copies of available information for a reasonable fee.  I understand that some of the potential risks of receiving the Services via telemedicine include:  Delay or interruption in medical evaluation due to technological equipment failure or  disruption; Information transmitted may not be sufficient (e.g. poor resolution of images) to allow for appropriate medical decision making by the Practitioner; and/or  In rare instances, security protocols could fail, causing a breach of personal health information.  Furthermore, I acknowledge that it is my responsibility to provide information about my medical history, conditions and care that is complete and accurate to the best of my ability. I acknowledge that Practitioner's advice, recommendations, and/or decision may be based on factors not within their control, such as incomplete or inaccurate data provided by me or distortions of diagnostic images or specimens that may result from electronic transmissions. I understand that the practice of medicine is not an exact science and that Practitioner makes no warranties or guarantees regarding treatment outcomes. I acknowledge that a copy of this consent can be made available to me via my patient portal [facility] MyChart), or I can request a printed copy by calling the office of [facility].    I understand that my insurance will be billed for this visit.   I have read or had this consent read to me. I understand the contents of this consent, which adequately explains the benefits and risks of the Services being provided via telemedicine.  I have been provided ample opportunity to ask questions regarding this consent and the Services and have had my questions answered to my satisfaction. I give my informed consent for the services to be provided through the use of telemedicine in my medical care

## 2023-03-13 NOTE — Telephone Encounter (Signed)
I have s/w the pt and she has been scheduled for tele pre op appt add on 03/17/23 due to procedure date and med hold. Med rec and consent are done.

## 2023-03-13 NOTE — Telephone Encounter (Signed)
-----   Message from Bryan E Gray, NP sent at 03/11/2023  3:38 PM EDT ----- Regarding: Request for pre-operative cardiac clearance Request for pre-operative cardiac clearance:  1. What type of surgery is being performed?  CARPAL TUNNEL RELEASE  2. When is this surgery scheduled?  03/23/2023  3. Type of clearance being requested (medical, pharmacy, both)? BOTH   4. Are there any medications that need to be held prior to surgery? APIXABAN  5. Practice name and name of physician performing surgery?  Performing surgeon: Dr. Kevin Krasinski, MD Requesting clearance: Bryan Gray, FNP-C    6. Anesthesia type (none, local, MAC, general)? GENERAL  7. What is the office phone and fax number?   Fax: [phone]  ATTENTION: Unable to create telephone message as per your standard workflow. Directed by [facility] providers to send requests for cardiac clearance to this pool for appropriate distribution to provider covering pre-operative clearances.   Bryan Gray, MSN, APRN, FNP-C, CEN [facility]  Peri-operative Services Nurse Practitioner Phone: [phone] 03/11/23 3:38 PM  

## 2023-03-13 NOTE — Telephone Encounter (Signed)
   Name: Kerri Mills  DOB: Mar 19, 1931  MRN: [id number]  Primary Cardiologist: Julien Nordmann, MD   Preoperative team, please contact this patient and set up a phone call appointment for further preoperative risk assessment. Please obtain consent and complete medication review. Thank you for your help.  I confirm that guidance regarding antiplatelet and oral anticoagulation therapy has been completed and, if necessary, noted below.  Per office protocol, patient can hold Eliquis for 1-2 days prior to procedure.  Please resume Eliquis as soon as possible postprocedure, at the discretion of the surgeon.    Joylene Grapes, NP 03/13/2023, 1:38 PM [facility]

## 2023-03-13 NOTE — Telephone Encounter (Signed)
Patient with diagnosis of afib on Eliquis for anticoagulation.    Procedure: CARPAL TUNNEL RELEASE  Date of procedure: 03/23/2023   CHA2DS2-VASc Score = 5   This indicates a 7.2% annual risk of stroke. The patient's score is based upon: CHF History: 1 HTN History: 1 Diabetes History: 0 Stroke History: 0 Vascular Disease History: 0 Age Score: 2 Gender Score: 1     CrCl 43 mL/min Platelet count 348 K    Per office protocol, patient can hold Eliquis for 1-2 days prior to procedure.     **This guidance is not considered finalized until pre-operative APP has relayed final recommendations.**

## 2023-03-14 ENCOUNTER — Encounter
Admission: RE | Admit: 2023-03-14 | Discharge: 2023-03-14 | Disposition: A | Discharge status: Home / Self Care | Payer: PPO | Source: Ambulatory Visit | Attending: Orthopedic Surgery | Admitting: Orthopedic Surgery

## 2023-03-14 ENCOUNTER — Telehealth: Payer: Self-pay | Admitting: *Deleted

## 2023-03-14 HISTORY — DX: Obstructive sleep apnea (adult) (pediatric): G47.33

## 2023-03-14 HISTORY — DX: Unspecified macular degeneration: H35.30

## 2023-03-14 NOTE — Telephone Encounter (Signed)
   Patient Name: Kerri Mills  DOB: August 03, 1931 MRN: [id number]  Primary Cardiologist: Julien Nordmann, MD  Chart reviewed as part of pre-operative protocol coverage.  This is a duplicate request.  Patient has a preop telephone visit scheduled for 03/17/2023.  Clearance will be addressed at that time.   Joylene Grapes, NP 03/14/2023, 11:52 AM

## 2023-03-14 NOTE — Patient Instructions (Signed)
Your procedure is scheduled on: Thursday, June 27 Report to the Registration Desk on the 1st floor of the [facility] To find out your arrival time, please call [phone] between 1PM - 3PM on: Wednesday, June 26 If your arrival time is 6:00 am, do not arrive before that time as the [facility] entrance doors do not open until 6:00 am.  REMEMBER: Instructions that are not followed completely may result in serious medical risk, up to and including death; or upon the discretion of your surgeon and anesthesiologist your surgery may need to be rescheduled.  Do not eat or drink after midnight the night before surgery.  No gum chewing or hard candies.  One week prior to surgery: starting June 20 Stop Anti-inflammatories (NSAIDS) such as Advil, Aleve, Ibuprofen, Motrin, Naproxen, Naprosyn and Aspirin based products such as Excedrin, Goody's Powder, BC Powder. Stop ANY OVER THE COUNTER supplements until after surgery. Stop vitamin D, preservision Areds You may however, continue to take Tylenol if needed for pain up until the day of surgery.  Continue taking all prescribed medications with the exception of the following:  Eliquis - hold for 1-2 days per Dr. Mariah Milling instructions. Last day to take is Tuesday, June 25. Resume AFTER surgery per surgeon instructions.  TAKE ONLY THESE MEDICATIONS THE MORNING OF SURGERY WITH A SIP OF WATER:  Amiodarone Levothyroxine Metoprolol Omeprazole (Prilosec) - (take one the night before and one on the morning of surgery - helps to prevent nausea after surgery.)  No Alcohol for 24 hours before or after surgery.  No Smoking including e-cigarettes for 24 hours before surgery.  No chewable tobacco products for at least 6 hours before surgery.  No nicotine patches on the day of surgery.  Do not use any "recreational" drugs for at least a week (preferably 2 weeks) before your surgery.  Please be advised that the combination of cocaine and anesthesia may have  negative outcomes, up to and including death. If you test positive for cocaine, your surgery will be cancelled.  On the morning of surgery brush your teeth with toothpaste and water, you may rinse your mouth with mouthwash if you wish. Do not swallow any toothpaste or mouthwash.  Use CHG Soap as directed on instruction sheet.  Do not wear jewelry, make-up, hairpins, clips or nail polish.  Do not wear lotions, powders, or perfumes.   Do not shave body hair from the neck down 48 hours before surgery.  Contact lenses, hearing aids and dentures may not be worn into surgery.  Do not bring valuables to the hospital. [facility] is not responsible for any missing/lost belongings or valuables.   Bring your C-PAP to the hospital in case you may have to spend the night.   Notify your doctor if there is any change in your medical condition (cold, fever, infection).  Wear comfortable clothing (specific to your surgery type) to the hospital.  After surgery, you can help prevent lung complications by doing breathing exercises.  Take deep breaths and cough every 1-2 hours. Your doctor may order a device called an Incentive Spirometer to help you take deep breaths.  If you are being discharged the day of surgery, you will not be allowed to drive home. You will need a responsible individual to drive you home and stay with you for 24 hours after surgery.   If you are taking public transportation, you will need to have a responsible individual with you.  Please call the Pre-admissions Testing Dept. at [phone] if you have any questions about these instructions.  Surgery Visitation Policy:  Patients having surgery or a procedure may have two visitors.  Children under the age of 56 must have an adult with them who is not the patient.     Preparing for Surgery with CHLORHEXIDINE GLUCONATE (CHG) Soap  Chlorhexidine Gluconate (CHG) Soap  o An antiseptic cleaner that kills germs and bonds  with the skin to continue killing germs even after washing  o Used for showering the night before surgery and morning of surgery  Before surgery, you can play an important role by reducing the number of germs on your skin.  CHG (Chlorhexidine gluconate) soap is an antiseptic cleanser which kills germs and bonds with the skin to continue killing germs even after washing.  Please do not use if you have an allergy to CHG or antibacterial soaps. If your skin becomes reddened/irritated stop using the CHG.  1. Shower the NIGHT BEFORE SURGERY and the MORNING OF SURGERY with CHG soap.  2. If you choose to wash your hair, wash your hair first as usual with your normal shampoo.  3. After shampooing, rinse your hair and body thoroughly to remove the shampoo.  4. Use CHG as you would any other liquid soap. You can apply CHG directly to the skin and wash gently with a scrungie or a clean washcloth.  5. Apply the CHG soap to your body only from the neck down. Do not use on open wounds or open sores. Avoid contact with your eyes, ears, mouth, and genitals (private parts). Wash face and genitals (private parts) with your normal soap.  6. Wash thoroughly, paying special attention to the area where your surgery will be performed.  7. Thoroughly rinse your body with warm water.  8. Do not shower/wash with your normal soap after using and rinsing off the CHG soap.  9. Pat yourself dry with a clean towel.  10. Wear clean pajamas to bed the night before surgery.  12. Place clean sheets on your bed the night of your first shower and do not sleep with pets.  13. Shower again with the CHG soap on the day of surgery prior to arriving at the hospital.  14. Do not apply any deodorants/lotions/powders.  15. Please wear clean clothes to the hospital.

## 2023-03-14 NOTE — Telephone Encounter (Signed)
-----   Message from Verlee Monte, NP sent at 03/11/2023  3:38 PM EDT ----- Regarding: Request for pre-operative cardiac clearance Request for pre-operative cardiac clearance:  1. What type of surgery is being performed?  CARPAL TUNNEL RELEASE  2. When is this surgery scheduled?  03/23/2023  3. Type of clearance being requested (medical, pharmacy, both)? BOTH   4. Are there any medications that need to be held prior to surgery? APIXABAN  5. Practice name and name of physician performing surgery?  Performing surgeon: Dr. Juanell Fairly, MD Requesting clearance: Quentin Mulling, FNP-C    6. Anesthesia type (none, local, MAC, general)? GENERAL  7. What is the office phone and fax number?   Fax: [phone]  ATTENTION: Unable to create telephone message as per your standard workflow. Directed by [facility] providers to send requests for cardiac clearance to this pool for appropriate distribution to provider covering pre-operative clearances.   Quentin Mulling, MSN, APRN, FNP-C, CEN [facility]  Peri-operative Services Nurse Practitioner Phone: [phone] 03/11/23 3:38 PM

## 2023-03-16 NOTE — Progress Notes (Signed)
Virtual Visit via Telephone Note   Because of Kerri Mills's co-morbid illnesses, she is at least at moderate risk for complications without adequate follow up.  This format is felt to be most appropriate for this patient at this time.  The patient did not have access to video technology/had technical difficulties with video requiring transitioning to audio format only (telephone).  All issues noted in this document were discussed and addressed.  No physical exam could be performed with this format.  Please refer to the patient's chart for her consent to telehealth for Kerri Mills.  Evaluation Performed:  Preoperative cardiovascular risk assessment _____________   Date:  03/16/2023   Patient ID:  Kerri Mills, DOB 1931/06/18, MRN [id number] Patient Location:  Home Provider location:   Office  Primary Care Provider:  Dale Alcolu, MD Primary Cardiologist:  Julien Nordmann, MD  Chief Complaint / Patient Profile   89 y.o. y/o female with a h/o atrial fibrillation tachybradycardia syndrome, PVCs, heart failure with mildly reduced ejection fraction who is pending release and presents today for telephonic preoperative cardiovascular risk assessment.  History of Present Illness    Kerri Mills is a 89 y.o. female who presents via audio/video conferencing for a telehealth visit today.  Pt was last seen in cardiology clinic on 01/27/2023 by Sherie Don, NP.  At that time Kerri Mills was doing well .  The patient is now pending procedure as outlined above. Since her last visit, she remained stable from a cardiac standpoint.  Today she denies chest pain, shortness of breath, lower extremity edema, fatigue, palpitations, melena, hematuria, hemoptysis, diaphoresis, weakness, presyncope, syncope, orthopnea, and PND.   Past Medical History    Past Medical History:  Diagnosis Date   Allergy    Anxiety    Arthritis    Atypical chest pain    a. 10/2018 MV: small, fixed apical defect  possibly 2/2 attenuation artifact. No ischemia.  EF 68%.   CHF (congestive heart failure) (HCC)    Clotting disorder (HCC)    Colitis    Depression    Diverticulitis 2013   Dysrhythmia    Gastric ulcer    GERD (gastroesophageal reflux disease)    History of echocardiogram    a. 09/2019 Echo: EF 55-60%, mod LVH. Mildly dil LA. Triv MR/TR.   Hypercholesterolemia    Hypertension    Hypothyroidism    Infiltrating lobular carcinoma of left breast 2011   T2,N0, ER: 90%; PR 0%; Her 2 neu not amplified. [facility]).   Macular degeneration    Melanoma (HCC) 1997   Melanoma in situ of upper extremity (HCC) 03/19/2011   Obstructive sleep apnea on CPAP    Persistent atrial fibrillation (HCC)    a. CHADS2VASc => 4 (HTN, age x 2, female)   Personal history of radiation therapy 2011   BREAST CA   Presence of permanent cardiac pacemaker    Seroma    HISTORY OF LFT BREAST   Thyroid cancer (HCC) 1992   Past Surgical History:  Procedure Laterality Date   ABDOMINAL HYSTERECTOMY  1973   partial   BREAST BIOPSY Left 02/13/2013   BENIGN BREAST TISSUE WITH CHANGES CONSISTENT WITH FAT NECROSIS   BREAST BIOPSY Left 01/21/2015   bx done in [facility] office 11:00 left 6-8cmfn   BREAST EXCISIONAL BIOPSY Left 1995   neg   BREAST EXCISIONAL BIOPSY Left 2011   Breast cancer radiation   BREAST LUMPECTOMY Left 2011   BREAST CA  CARDIAC CATHETERIZATION     CHOLECYSTECTOMY     COLONOSCOPY  2013   COLONOSCOPY WITH PROPOFOL N/A 06/09/2021   Procedure: COLONOSCOPY WITH PROPOFOL;  Surgeon: Wyline Mood, MD;  Location: [facility] ENDOSCOPY;  Service: Gastroenterology;  Laterality: N/A;   ESOPHAGOGASTRODUODENOSCOPY  2013   HAMMER TOE SURGERY Bilateral 02/24/2022   Procedure: HAMMER TOE CORRECTION 2, 3, 4 Right and 2nd Left;  Surgeon: Felecia Shelling, DPM;  Location: [facility] ORS;  Service: Podiatry;  Laterality: Bilateral;   MELANOMA EXCISION     RT UPPER ARM   OPEN REDUCTION INTERNAL FIXATION (ORIF) DISTAL  RADIAL FRACTURE Right 12/06/2022   Procedure: OPEN REDUCTION INTERNAL FIXATION (ORIF) DISTAL RADIUS FRACTURE;  Surgeon: Juanell Fairly, MD;  Location: [facility] ORS;  Service: Orthopedics;  Laterality: Right;   PACEMAKER IMPLANT N/A 01/10/2020   Procedure: PACEMAKER IMPLANT;  Surgeon: Regan Lemming, MD;  Location: [facility] INVASIVE CV LAB;  Service: Cardiovascular;  Laterality: N/A;   PARTIAL HYSTERECTOMY     bleeding, ovaries in place.     THYROIDECTOMY, PARTIAL  1992   FOR THYROID CANCER   TONSILLECTOMY      Allergies  Allergies  Allergen Reactions   Lipitor [Atorvastatin Calcium] Other (See Comments)    Stiffness & soreness   Penicillins Rash    Home Medications    Prior to Admission medications   Medication Sig Start Date End Date Taking? Authorizing Provider  acetaminophen (TYLENOL) 650 MG CR tablet Take 650 mg by mouth every 8 (eight) hours as needed for pain.    [provider]  amiodarone (PACERONE) 200 MG tablet TAKE 1 TABLET BY MOUTH DAILY 02/06/23   Camnitz, Andree Coss, MD  cetirizine (ZYRTEC) 5 MG tablet Take 1 tablet (5 mg total) by mouth daily. 11/22/22   Raechel Chute, MD  cholecalciferol (VITAMIN D3) 25 MCG (1000 UNIT) tablet Take 2,000 Units by mouth daily.    [provider]  DULoxetine (CYMBALTA) 60 MG capsule TAKE ONE CAPSULE AT BEDTIME 02/23/23   Dale [place], MD  ELIQUIS 5 MG TABS tablet TAKE ONE TABLET BY MOUTH TWICE DAILY 02/27/23   Antonieta Iba, MD  furosemide (LASIX) 20 MG tablet Take 20 mg by mouth daily.    [provider]  gabapentin (NEURONTIN) 300 MG capsule Take 2 capsules (600 mg total) by mouth at bedtime. 12/19/22   Dale [place], MD  levothyroxine (SYNTHROID) 125 MCG tablet Take 1 tablet (125 mcg total) by mouth daily. 11/10/22   Dale [place], MD  lovastatin (MEVACOR) 40 MG tablet TAKE 1 TABLET BY MOUTH DAILY Patient taking differently: Take 40 mg by mouth at bedtime. 03/11/22   Sherlene Shams, MD  metoprolol  tartrate (LOPRESSOR) 50 MG tablet TAKE 1.5 TABLETS BY MOUTH TWICE DAILY. 03/01/23   Antonieta Iba, MD  Multiple Vitamins-Minerals (PRESERVISION AREDS 2+MULTI VIT PO) Take 1 tablet by mouth 2 (two) times daily.    [provider]  nystatin (MYCOSTATIN/NYSTOP) powder Apply 1 Application topically 2 (two) times daily. 02/16/23   Dale [place], MD  nystatin cream (MYCOSTATIN) Apply 1 Application topically 2 (two) times daily. 02/16/23   Dale [place], MD  omeprazole (PRILOSEC) 20 MG capsule TAKE 1 CAPSULE BY MOUTH ONCE DAILY 08/17/22   Dale [place], MD  Polyethyl Glycol-Propyl Glycol (SYSTANE OP) Place 1 drop into both eyes daily as needed (for dry eyes).    [provider]  polyethylene glycol powder (GLYCOLAX/MIRALAX) 17 GM/SCOOP powder Take 17 g by mouth daily. Patient taking differently: Take 17 g  by mouth as needed. 09/10/20   Dale Hallam, MD  spironolactone (ALDACTONE) 25 MG tablet Take 25 mg by mouth daily. 02/07/23   [provider]  triamcinolone cream (KENALOG) 0.1 % Apply 1 application. topically 2 (two) times daily. 02/16/22   Dale [place], MD  White Petrolatum-Mineral Oil (GENTEAL TEARS NIGHT-TIME OP) Place 1 application into both eyes at bedtime. Night time ointment 3.5g    [provider]    Physical Exam    Vital Signs:  Kerri Mills does not have vital signs available for review today.  Given telephonic nature of communication, physical exam is limited. AAOx3. NAD. Normal affect.  Speech and respirations are unlabored.  Accessory Clinical Findings    None  Assessment & Plan    1.  Preoperative Cardiovascular Risk Assessment: Carpal tunnel release, Dr. Juanell Fairly, 03/22/2022, fax #[phone]      Primary Cardiologist: Julien Nordmann, MD  Chart reviewed as part of pre-operative protocol coverage. Given past medical history and time since last visit, based on ACC/AHA guidelines, Kerri Mills would be at acceptable risk for  the planned procedure without further cardiovascular testing.   Her RCRI is a class II risk, 0.9% risk of major cardiac event.  She is able to complete greater than 4 METS of physical activity.  Per office protocol, patient can hold Eliquis for 1-2 days prior to procedure.   Patient was advised that if she develops new symptoms prior to surgery to contact our office to arrange a follow-up appointment.  She verbalized understanding.  I will route this recommendation to the requesting party via Epic fax function and remove from pre-op pool.       Time:   Today, I have spent 6 minutes with the patient with telehealth technology discussing medical history, symptoms, and management plan.  Prior to her phone evaluation I spent greater than 10 minutes reviewing her past medical history and cardiac medications.   Ronney Asters, NP  03/16/2023, 4:39 PM

## 2023-03-17 ENCOUNTER — Ambulatory Visit: Payer: PPO | Attending: Cardiovascular Disease

## 2023-03-17 DIAGNOSIS — Z01810 Encounter for preprocedural cardiovascular examination: Secondary | ICD-10-CM

## 2023-03-20 ENCOUNTER — Encounter: Payer: Self-pay | Admitting: Orthopedic Surgery

## 2023-03-20 ENCOUNTER — Encounter: Payer: Self-pay | Admitting: Cardiology

## 2023-03-20 NOTE — Progress Notes (Signed)
Perioperative / Anesthesia Services  Pre-Admission Testing Clinical Review / Preoperative Anesthesia Consult  Date: 03/21/23  Patient Demographics:  Name: Kerri Mills DOB:   1931-04-12 MRN:   [id number]  Planned Surgical Procedure(s):    Case: [id number] Date/Time: 03/23/23 1153   Procedure: CARPAL TUNNEL RELEASE (Right)   Anesthesia type: Choice   Pre-op diagnosis: right carpal tunnel syndrome   Location: [facility] OR ROOM 02 / [facility]   Surgeons: Juanell Fairly, MD     NOTE: Available PAT nursing documentation and vital signs have been reviewed. Clinical nursing staff has updated patient's PMH/PSHx, current medication list, and drug allergies/intolerances to ensure comprehensive history available to assist in medical decision making as it pertains to the aforementioned surgical procedure and anticipated anesthetic course. Extensive review of available clinical information personally performed. Kerri Mills PMH and PSHx updated with any diagnoses/procedures that  may have been inadvertently omitted during her intake with the pre-admission testing department's nursing staff.  Clinical Discussion:  Kerri Mills is a 89 y.o. female who is submitted for pre-surgical anesthesia review and clearance prior to her undergoing the above procedure. Patient has never been a smoker. Pertinent PMH includes: CAD, PAF, CHF, SSS/tachy-brady syndrome (s/p PPM placement), LBBB, ascending aortic dilatation, aortic atherosclerosis, angina, HTN, HLD, hypothyroidism, OSAH (requires nocturnal PAP therapy), remote thyroid cancer (s/p partial thyroidectomy), remote LEFT breast cancer, GERD (on daily PPI), hiatal hernia, gastric ulcer, OA, cervical DDD, tremors, anxiety, depression.  Patient is followed by cardiology Mariah Milling, MD). She was last seen in the cardiology clinic on 08/09/2022; notes reviewed. At the time of her clinic visit, patient complaining of some mild shortness of breath and chronic  peripheral edema. Symptoms reported to be stable and at baseline. Patient denied any chest pain, PND, orthopnea, palpitations, weakness, fatigue, vertiginous symptoms, or presyncope/syncope. Patient with a past medical history significant for cardiovascular diagnoses. Documented physical exam was grossly benign, providing no evidence of acute exacerbation and/or decompensation of the patient's known cardiovascular conditions.  Patient with a history of sick sinus syndrome necessitating placement of a permanent pacemaker.  She underwent placement of a [employer] Jude Assurity MRI device on 01/10/2020.  Device is regularly interrogated by her primary electrophysiology team.  Last interrogation was on 02/12/2023, at which time device was noted to be functioning properly.  Long-term cardiac event monitor study was performed on 06/20/2022 revealing a predominant underlying paced rhythm.  Frequent PVCs noted accounting for a 22.1% study burden.  There were no patient triggered events.  Most recent TTE was performed on 01/24/2023 revealing a mildly to moderately reduced left ventricular systolic function with an EF of 40-45%.  There was global hypokinesis.  Moderate LVH noted.  Diastolic Doppler parameters were indeterminate.  Right ventricular size and function normal.  Mildly elevated PASP of 42.9 mmHg.  There was mild to moderate mitral valve regurgitation.  Aortic valve was sclerotic.  All transvalvular gradients were noted to be normal providing no evidence suggestive of valvular stenosis.  Ascending aorta borderline dilated measuring 37 mm.  Most recent myocardial perfusion imaging study performed on 02/03/2023 revealed a mild to moderately reduced left ventricular systolic function with an EF of 40%.  Small region of apical hypokinesis noted.  Paced rhythm observed throughout the study.  CT attenuation correction images demonstrated diffuse aortic atherosclerosis and minimal coronary artery calcifications.  There  was a small region of fixed defect in the distal anterior and apical region suggestive of attenuation artifact versus small prior MI.  There was no evidence of significant ischemia.  Study determined to be low risk overall.  Patient with an atrial fibrillation diagnosis; CHA2DS2-VASc Score = 6 (age x 2, sex, CHF, HTN, vascular disease history). Her rate and rhythm are currently being maintained on oral amiodarone + metoprolol tartrate. She is chronically anticoagulated using apixaban; reported to be compliant with therapy with no evidence or reports of GI bleeding.  Blood pressure well controlled at 128/80 mmHg on currently prescribed diuretic (furosemide + spironolactone) and beta-blocker (metoprolol tartrate) therapies. She is on lovastatin for her HLD diagnosis and further ASCVD prevention.  Patient is not diabetic.  She does have an OSAH diagnosis and is reported to be compliant with prescribed nocturnal PAP therapy.  Functional capacity somewhat limited by age and her medical comorbidities.  With that said, patient still felt to be able to achieve at least 4 METS of physical activity without experiencing any significant degree of angina/anginal equivalent symptoms.  No changes were made to her medication regimen.  Patient to follow-up with outpatient cardiology in 1 year or sooner if needed.  Kerri Mills is scheduled for an CARPAL TUNNEL RELEASE (Right) on 03/23/2023 with Dr. Juanell Fairly, MD.  Given patient's past medical history significant for cardiovascular diagnoses, presurgical cardiac clearance was sought by the PAT team. Per cardiology, "RCRI is a class II risk, 0.9% risk of major cardiac event. She is able to complete >4 METS of physical activity. Given past medical history and time since last visit, based on ACC/AHA guidelines, Kerri Mills would be at ACCEPTABLE risk for the planned procedure without further cardiovascular testing".  Again, this patient is on chronic anticoagulation just a  DOAC. She has been instructed on recommendations for holding her apixaban for 1 day prior to her procedure with plans to restart as soon as postoperative bleeding risk felt to be minimized by her attending surgeon. The patient has been instructed that her last dose of her apixaban should be on 03/21/2023.  Patient denies previous perioperative complications with anesthesia in the past. In review of the available records, it is noted that patient underwent a general anesthetic course (ASA III) in 11/2022 without documented complications.      03/14/2023   11:12 AM 03/07/2023   12:09 PM 02/17/2023   11:40 AM  Vitals with BMI  Height 5\' 5"  5\' 5"    Weight 163 lbs 168 lbs 3 oz 168 lbs 3 oz  BMI 27.12 27.99 27.99  Systolic  108 132  Diastolic  60 76  Pulse  79 84    Providers/Specialists:   NOTE: Primary physician provider listed below. Patient may have been seen by APP or partner within same practice.   PROVIDER ROLE / SPECIALTY LAST Rae Halsted, MD Orthopedics (Surgeon) ???  Dale [place], MD Primary Care Provider 02/21/2023  Julien Nordmann, MD Cardiology 08/09/2022; update preop APP call on 03/17/2023  Erin Fulling, MD Pulmonary Medicine 03/07/2023  Cristopher Peru, MD Neurology 05/13/2022   Allergies:  Lipitor [atorvastatin calcium] and Penicillins  Current Home Medications:   No current facility-administered medications for this encounter.    acetaminophen (TYLENOL) 650 MG CR tablet   amiodarone (PACERONE) 200 MG tablet   cetirizine (ZYRTEC) 5 MG tablet   cholecalciferol (VITAMIN D3) 25 MCG (1000 UNIT) tablet   DULoxetine (CYMBALTA) 60 MG capsule   ELIQUIS 5 MG TABS tablet   furosemide (LASIX) 20 MG tablet   gabapentin (NEURONTIN) 300 MG capsule   levothyroxine (SYNTHROID) 125 MCG tablet  lovastatin (MEVACOR) 40 MG tablet   metoprolol tartrate (LOPRESSOR) 50 MG tablet   Multiple Vitamins-Minerals (PRESERVISION AREDS 2+MULTI VIT PO)   nystatin (MYCOSTATIN/NYSTOP)  powder   nystatin cream (MYCOSTATIN)   omeprazole (PRILOSEC) 20 MG capsule   Polyethyl Glycol-Propyl Glycol (SYSTANE OP)   polyethylene glycol powder (GLYCOLAX/MIRALAX) 17 GM/SCOOP powder   spironolactone (ALDACTONE) 25 MG tablet   triamcinolone cream (KENALOG) 0.1 %   White Petrolatum-Mineral Oil (GENTEAL TEARS NIGHT-TIME OP)   History:   Past Medical History:  Diagnosis Date   Allergy    Anxiety    Aortic atherosclerosis (HCC)    Arthritis    Ascending aorta dilatation (HCC) 01/24/2023   a.) TTE 01/24/2023: asc Ao 37 mm (borderline)   Atypical chest pain    Benign essential tremor    CAD (coronary artery disease)    a.) MV 09/30/2017: EF 55-65%, small perfusion defect in apical septal and apex location --> attenuation artifact vs small area of ischemia; b.) TTE 11/17/2019: EF 68%, small fixed apical defect possibly 2/2 attenuation artifact, no ischemia; c.) MV 02/03/2023: EF 40, apical HK, small fixed defect in distal anterior and apical region --> attentuation artifact vs small prior MI, no sig ischemia   Carpal tunnel syndrome    CHF (congestive heart failure) (HCC)    a.) TTE 09/30/2017: EF 55-60%, mod LAE, mild TR, G1DD; b.) TTE 12/10/2018: EF 55-60%, mild LVH, mild AoV sclerosis, G1DD; c.) TTE 10/21/2019: EF 55-60%, mod LVH, mild LAE, triv MR/TR, mild-mod AoV sclerosis; d.)  TTE 01/24/2023: EF 40-45%, glob HK, mild-mod TR, mild AoV sclerosis, PASP 42.9   Clotting disorder (HCC)    Colitis    DDD (degenerative disc disease), cervical    Depression    Diverticulitis 2013   Gastric ulcer    GERD (gastroesophageal reflux disease)    Hiatal hernia    Hypercholesterolemia    Hypertension    Hypothyroidism    Infiltrating lobular carcinoma of left breast 2011   a.) T2,N0, ER: 90%; PR 0%; Her 2 neu not amplified. [facility]); s/p lumpectomy + aduvant XRT   LBBB (left bundle branch block)    Long term current use of amiodarone    Long term current use of anticoagulant     a.) apixaban   Macular degeneration    Melanoma (HCC) 1997   Melanoma in situ of upper extremity (HCC) 03/19/2011   Obstructive sleep apnea on CPAP    Persistent atrial fibrillation (HCC)    a.) CHA2DS2VASc = 6 (age x2, sex, CHF, HTN, vascular disease history);  b.) rate/rhythm maintained on oral amiodarone + metoprolol tartrate; chronically anticoagulated with apixaban   Presence of permanent cardiac pacemaker 01/10/2020   a.) s/p placement 01/10/2020 for SSS; St Jude Medical Assurity MRI  model [id number] (SN: [id number] )   Seroma of LEFT breast 2011   a.) postoperative complication following lumpectomy   SSS (sick sinus syndrome) (HCC)    a.) s/p PPM placement 01/10/2020   Thyroid cancer (HCC) 1992   a.) s/p partial thyroidectomy   Vitamin D deficiency    Past Surgical History:  Procedure Laterality Date   ABDOMINAL HYSTERECTOMY  1973   partial   BREAST BIOPSY Left 02/13/2013   BENIGN BREAST TISSUE WITH CHANGES CONSISTENT WITH FAT NECROSIS   BREAST BIOPSY Left 01/21/2015   bx done in [facility] office 11:00 left 6-8cmfn   BREAST EXCISIONAL BIOPSY Left 1995   neg   BREAST EXCISIONAL BIOPSY Left 2011   Breast cancer  radiation   BREAST LUMPECTOMY Left 2011   BREAST CA   CARDIAC CATHETERIZATION     CHOLECYSTECTOMY     COLONOSCOPY  2013   COLONOSCOPY WITH PROPOFOL N/A 06/09/2021   Procedure: COLONOSCOPY WITH PROPOFOL;  Surgeon: Wyline Mood, MD;  Location: [facility] ENDOSCOPY;  Service: Gastroenterology;  Laterality: N/A;   ESOPHAGOGASTRODUODENOSCOPY  2013   HAMMER TOE SURGERY Bilateral 02/24/2022   Procedure: HAMMER TOE CORRECTION 2, 3, 4 Right and 2nd Left;  Surgeon: Felecia Shelling, DPM;  Location: [facility] ORS;  Service: Podiatry;  Laterality: Bilateral;   MELANOMA EXCISION     RT UPPER ARM   OPEN REDUCTION INTERNAL FIXATION (ORIF) DISTAL RADIAL FRACTURE Right 12/06/2022   Procedure: OPEN REDUCTION INTERNAL FIXATION (ORIF) DISTAL RADIUS FRACTURE;  Surgeon: Juanell Fairly, MD;  Location:  [facility] ORS;  Service: Orthopedics;  Laterality: Right;   PACEMAKER IMPLANT N/A 01/10/2020   Procedure: PACEMAKER IMPLANT;  Surgeon: Regan Lemming, MD;  Location: [facility] INVASIVE CV LAB;  Service: Cardiovascular;  Laterality: N/A;   PARTIAL HYSTERECTOMY     bleeding, ovaries in place.     THYROIDECTOMY, PARTIAL  1992   FOR THYROID CANCER   TONSILLECTOMY     Family History  Problem Relation Age of Onset   Heart disease Mother    Cancer Sister        breast   Cancer Brother        lung    Breast cancer Neg Hx    Social History   Tobacco Use   Smoking status: Never   Smokeless tobacco: Never   Tobacco comments:    never  Vaping Use   Vaping Use: Never used  Substance Use Topics   Alcohol use: Not Currently    Comment: once in a while   Drug use: No    Pertinent Clinical Results:  LABS:   Lab Results  Component Value Date   WBC 6.6 02/16/2023   HGB 12.1 02/16/2023   HCT 39.1 02/16/2023   MCV 96.1 02/16/2023   PLT 348.0 02/16/2023   Lab Results  Component Value Date   NA 139 03/02/2023   K 4.5 03/02/2023   CO2 30 03/02/2023   GLUCOSE 142 (H) 03/02/2023   BUN 15 03/02/2023   CREATININE 0.90 03/02/2023   CALCIUM 9.7 03/02/2023   GFR 56.61 (L) 03/02/2023   EGFR 48 (L) 11/15/2022   GFRNONAA >60 02/07/2023    ECG: Date: 02/04/2023 Time ECG obtained: 1356 PM Rate: 81 bpm Rhythm:  AV dual-paced rhythm Axis (leads I and aVF): Normal Intervals: PR 196 ms. QRS 170 ms. QTc 557 ms. ST segment and T wave changes: No evidence of acute ST segment elevation or depression Comparison: Similar to previous tracing obtained on 01/27/2023   IMAGING / PROCEDURES: DIAGNOSTIC RADIOGRAPHS OF CHEST 2 VIEWS performed on 02/17/2023 Cardiomediastinal silhouette unchanged in size and contour. Unchanged pacing device on the left chest wall with 2 leads in place. Improved aeration of the lungs, with interval near complete resolution of reticulonodular opacities.  No new airspace  disease. Some residual reticulonodular opacity at the right lung base. No pneumothorax or pleural effusion. Degenerative changes of the spine.  No displaced fracture  NM MYOCAR MULTI W/SPECT W/WALL MOTION / EF performed on 02/03/2023 Small region fixed defect in the distal anterior and apical region, unable to exclude attenuation artifact versus small prior MI Small region hypokinesis in the apical wall EF estimated at 40% No EKG changes concerning for ischemia at peak stress or  in recovery. Paced rhythm CT attenuation correction images with: Pacing wire in the RA and RV, mild diffuse aortic atherosclerosis, minimal coronary calcification Pharmacological myocardial perfusion imaging study with no significant  ischemia Low risk scan  TRANSTHORACIC ECHOCARDIOGRAM performed on 01/24/2023 Left ventricular ejection fraction, by estimation, is 40 to 45%. Left ventricular ejection fraction by 3D volume is 42 %. The left ventricle has mildly decreased function. The left ventricle demonstrates global hypokinesis. There is moderate left ventricular hypertrophy. Left ventricular diastolic parameters are indeterminate.  Right ventricular systolic function is normal. The right ventricular size is normal. There is mildly elevated pulmonary artery systolic pressure. The estimated right ventricular systolic pressure is 42.9 mmHg.  The mitral valve is normal in structure. No evidence of mitral valve regurgitation. No evidence of mitral stenosis.  Tricuspid valve regurgitation is mild to moderate.  The aortic valve is tricuspid. Aortic valve regurgitation is not visualized. Aortic valve sclerosis is present, with no evidence of aortic valve stenosis.  There is borderline dilatation of the ascending aorta, measuring 37 mm.  The inferior vena cava is normal in size with greater than 50% respiratory variability, suggesting right atrial pressure of 3 mmHg.   CT ANGIO CHEST PE W AND/OR WO CONTRAST performed on  10/11/2022 No pulmonary embolism identified, with mild study limitations detailed above. Patchy ground-glass opacities throughout both lungs. Differential includes atypical/viral pneumonias, interstitial pneumonias, edema related to volume overload/CHF, hypersensitivity pneumonitis, and respiratory bronchiolitis. COVID pneumonia can have this appearance. Small hiatal hernia. Coronary artery calcifications.  LONG TERM CARDIAC EVENT MONITOR STUDY performed on  06/20/2022 Patch Wear Time:  7 days and 5 hours Predominant rhythm was AV paced Less than 1% supraventricular ectopy 22.1% ventricular ectopy No triggered episodes  Impression and Plan:  LILLAN MCCREADIE has been referred for pre-anesthesia review and clearance prior to her undergoing the planned anesthetic and procedural courses. Available labs, pertinent testing, and imaging results were personally reviewed by me in preparation for upcoming operative/procedural course. [facility] medical record has been updated following extensive record review and patient interview with PAT staff.   This patient has been appropriately cleared by cardiology with an overall ACCEPTABLE risk of experiencing significant perioperative cardiovascular complications. Completed perioperative prescription for cardiac device management documentation completed by primary cardiology team and placed on patient's chart for review by the surgical/anesthetic team on the day of her procedure. Electrophysiology indicating that procedure may interfere with planned surgical procedure. They ask that magnet be available for the case and placed over the device intraoperatively. Beyond normal perioperative cardiovascular monitoring, and the aforementioned magnet placement, there are no recommendations from electrophysiology team that prompt further discussion/recommendations from industry representative.   Based on clinical review performed today (03/21/23), barring any significant  acute changes in the patient's overall condition, it is anticipated that she will be able to proceed with the planned surgical intervention. Any acute changes in clinical condition may necessitate her procedure being postponed and/or cancelled. Patient will meet with anesthesia team (MD and/or CRNA) on the day of her procedure for preoperative evaluation/assessment. Questions regarding anesthetic course will be fielded at that time.   Pre-surgical instructions were reviewed with the patient during her PAT appointment, and questions were fielded to satisfaction by PAT clinical staff. She has been instructed on which medications that she will need to hold prior to surgery, as well as the ones that have been deemed safe/appropriate to take on the day of her procedure. As part of the general education provided by PAT,  patient made aware both verbally and in writing, that she would need to abstain from the use of any illegal substances during her perioperative course.  She was advised that failure to follow the provided instructions could necessitate case cancellation or result in serious perioperative complications up to and including death. Patient encouraged to contact PAT and/or her surgeon's office to discuss any questions or concerns that may arise prior to surgery; verbalized understanding.   Quentin Mulling, MSN, APRN, FNP-C, CEN [facility]  Peri-operative Services Nurse Practitioner Phone: [phone] Fax: [phone] 03/21/23 10:04 AM  NOTE: This note has been prepared using Dragon dictation software. Despite my best ability to proofread, there is always the potential that unintentional transcriptional errors may still occur from this process.

## 2023-03-20 NOTE — Progress Notes (Signed)
PERIOPERATIVE PRESCRIPTION FOR IMPLANTED CARDIAC DEVICE PROGRAMMING  Patient Information: Name:  Kerri Mills  DOB:  12-Feb-1931  MRN:  [id number]    Planned Procedure: RIGHT CARPAL TUNNEL RELEASE   Surgeon:  Dr. Juanell Fairly, MD  Requesting device clearance: Quentin Mulling, FNP-C  Date of Procedure:  03/23/2023  Cautery will be used.   Please route documentation back me via [facility], or may fax report to [facility] PAT APP at [phone].  Device Information:  Clinic EP Physician:  Loman Brooklyn, MD   Device Type:  Pacemaker Manufacturer and Phone #:  St. Jude/[employer]: [phone] Pacemaker Dependent?:  Yes.   Date of Last Device Check:  02/12/2023 Normal Device Function?:  Yes.    Electrophysiologist's Recommendations:  Have magnet available. Provide continuous ECG monitoring when magnet is used or reprogramming is to be performed.  Procedure may interfere with device function.  Magnet should be placed over device during procedure.  Per Device Clinic Standing Orders, Lenor Coffin, RN  8:37 PM 03/20/2023

## 2023-03-21 ENCOUNTER — Encounter: Payer: Self-pay | Admitting: Orthopedic Surgery

## 2023-03-21 NOTE — Progress Notes (Unsigned)
PCP: Primary Cardiologist:  HPI:  PCP: Dale Mount Ivy, MD (last seen yesterday) Cardiologist: Julien Nordmann, MD (last seen 11/23)  HPI:  Kerri Mills is a 89 y/o female with a history of thyroid/ breast cancer, hyperlipidemia, HTN, hypothyroidism, anxiety, clotting disorder, depression, tachy-brady syndrome s/p permanent pacemaker (04/21) , GERD, PVC's, atrial fibrillation, macular degeneration, OSA and chronic heart failure. Recent nuclear stress test on 02/03/2023 did not show any evidence of reversible ischemia. Recent pacemaker check on 01/27/2023 showed normal device function.  Admitted 02/04/23 due to productive cough, shortness of breath and bilateral leg swelling due to a/c heart failure exacerbation. IV diuresed. Placed on oxygen but unable to be weaned off of it. Admitted 11/28/22 due to right wrist fracture. No surgery needed. Admitted 10/11/22 due to influenza.   Echo 01/24/23: EF 40-45% along with moderate LVH, mildly elevated PA pressure of 42.9 mmHg, mild/moderate TR and borderline dilatation of the ascending aorta, measuring 37.  Echo 10/21/19: EF 55-60% along with moderate LVH, mild LAE and trivial MR.   She presents today for her initial HF visit with a chief complaint of moderate SOB with minimal exertion. Chronic in nature although seems to be slowly improving since recent admission. Has associated productive cough, fatigue, intermittent chest heaviness, head congestion, intermittent dizziness/ feeling unbalanced, abdominal distention and pedal edema along with this. Denies chest pain or weight gain.   Biggest complaint today is of this frequent, loose productive cough that she's had since before she was discharged from the hospital. Generally coughs up clear / white sputum but has recently noticed that there's a yellowish color to the sputum.   Admits to feeling overwhelmed with the medications and heart failure diagnosis. Has been admitted 3 times since 01/24 and doesn't feel like she  gets recovered before something else happens. Is concerned that her heart function may not improve.      ROS: All systems negative except as listed in HPI, PMH and Problem List.  SH:  Social History   Socioeconomic History   Marital status: Widowed    Spouse name: Not on file   Number of children: Not on file   Years of education: Not on file   Highest education level: Not on file  Occupational History   Not on file  Tobacco Use   Smoking status: Never   Smokeless tobacco: Never   Tobacco comments:    never  Vaping Use   Vaping Use: Never used  Substance and Sexual Activity   Alcohol use: Not Currently    Comment: once in a while   Drug use: No   Sexual activity: Not Currently  Other Topics Concern   Not on file  Social History Narrative   Independent and baseline. Lives by herself   Social Determinants of Health   Financial Resource Strain: Low Risk  (01/30/2023)   Overall Financial Resource Strain (CARDIA)    Difficulty of Paying Living Expenses: Not hard at all  Food Insecurity: No Food Insecurity (01/30/2023)   Hunger Vital Sign    Worried About Running Out of Food in the Last Year: Never true    Ran Out of Food in the Last Year: Never true  Transportation Needs: No Transportation Needs (01/30/2023)   PRAPARE - Administrator, Civil Service (Medical): No    Lack of Transportation (Non-Medical): No  Physical Activity: Sufficiently Active (02/08/2022)   Exercise Vital Sign    Days of Exercise per Week: 4 days    Minutes of Exercise  per Session: 40 min  Stress: No Stress Concern Present (01/30/2023)   [employer] of Occupational Health - Occupational Stress Questionnaire    Feeling of Stress : Not at all  Social Connections: Socially Integrated (01/30/2023)   Social Connection and Isolation Panel [NHANES]    Frequency of Communication with Friends and Family: More than three times a week    Frequency of Social Gatherings with Friends and Family: More  than three times a week    Attends Religious Services: More than 4 times per year    Active Member of [employer] or Organizations: Yes    Attends Engineer, structural: More than 4 times per year    Marital Status: Married  Catering manager Violence: Not At Risk (01/30/2023)   Humiliation, Afraid, Rape, and Kick questionnaire    Fear of Current or Ex-Partner: No    Emotionally Abused: No    Physically Abused: No    Sexually Abused: No    FH:  Family History  Problem Relation Age of Onset   Heart disease Mother    Cancer Sister        breast   Cancer Brother        lung    Breast cancer Neg Hx     Past Medical History:  Diagnosis Date   Allergy    Anxiety    Aortic atherosclerosis (HCC)    Arthritis    Ascending aorta dilatation (HCC) 01/24/2023   a.) TTE 01/24/2023: asc Ao 37 mm (borderline)   Atypical chest pain    Benign essential tremor    CAD (coronary artery disease)    a.) MV 09/30/2017: EF 55-65%, small perfusion defect in apical septal and apex location --> attenuation artifact vs small area of ischemia; b.) TTE 11/17/2019: EF 68%, small fixed apical defect possibly 2/2 attenuation artifact, no ischemia; c.) MV 02/03/2023: EF 40, apical HK, small fixed defect in distal anterior and apical region --> attentuation artifact vs small prior MI, no sig ischemia   Carpal tunnel syndrome    CHF (congestive heart failure) (HCC)    a.) TTE 09/30/2017: EF 55-60%, mod LAE, mild TR, G1DD; b.) TTE 12/10/2018: EF 55-60%, mild LVH, mild AoV sclerosis, G1DD; c.) TTE 10/21/2019: EF 55-60%, mod LVH, mild LAE, triv MR/TR, mild-mod AoV sclerosis; d.)  TTE 01/24/2023: EF 40-45%, glob HK, mild-mod TR, mild AoV sclerosis, PASP 42.9   Clotting disorder (HCC)    Colitis    DDD (degenerative disc disease), cervical    Depression    Diverticulitis 2013   Gastric ulcer    GERD (gastroesophageal reflux disease)    Hiatal hernia    Hypercholesterolemia    Hypertension    Hypothyroidism     Infiltrating lobular carcinoma of left breast 2011   a.) T2,N0, ER: 90%; PR 0%; Her 2 neu not amplified. [facility]); s/p lumpectomy + aduvant XRT   LBBB (left bundle branch block)    Long term current use of amiodarone    Long term current use of anticoagulant    a.) apixaban   Macular degeneration    Melanoma (HCC) 1997   Melanoma in situ of upper extremity (HCC) 03/19/2011   Obstructive sleep apnea on CPAP    Persistent atrial fibrillation (HCC)    a.) CHA2DS2VASc = 6 (age x2, sex, CHF, HTN, vascular disease history);  b.) rate/rhythm maintained on oral amiodarone + metoprolol tartrate; chronically anticoagulated with apixaban   Presence of permanent cardiac pacemaker 01/10/2020   a.)  s/p placement 01/10/2020 for SSS; St Jude Medical Assurity MRI  model [id number] (SN: [id number] )   Seroma of LEFT breast 2011   a.) postoperative complication following lumpectomy   SSS (sick sinus syndrome) (HCC)    a.) s/p PPM placement 01/10/2020   Thyroid cancer (HCC) 1992   a.) s/p partial thyroidectomy   Vitamin D deficiency     Current Outpatient Medications  Medication Sig Dispense Refill   acetaminophen (TYLENOL) 650 MG CR tablet Take 650 mg by mouth every 8 (eight) hours as needed for pain.     amiodarone (PACERONE) 200 MG tablet TAKE 1 TABLET BY MOUTH DAILY 30 tablet 2   cetirizine (ZYRTEC) 5 MG tablet Take 1 tablet (5 mg total) by mouth daily. 30 tablet 6   cholecalciferol (VITAMIN D3) 25 MCG (1000 UNIT) tablet Take 2,000 Units by mouth daily.     DULoxetine (CYMBALTA) 60 MG capsule TAKE ONE CAPSULE AT BEDTIME 90 capsule 1   ELIQUIS 5 MG TABS tablet TAKE ONE TABLET BY MOUTH TWICE DAILY 180 tablet 1   furosemide (LASIX) 20 MG tablet Take 20 mg by mouth daily.     gabapentin (NEURONTIN) 300 MG capsule Take 2 capsules (600 mg total) by mouth at bedtime. 180 capsule 1   levothyroxine (SYNTHROID) 125 MCG tablet Take 1 tablet (125 mcg total) by mouth daily. 90 tablet 3   lovastatin  (MEVACOR) 40 MG tablet TAKE 1 TABLET BY MOUTH DAILY (Patient taking differently: Take 40 mg by mouth at bedtime.) 90 tablet 3   metoprolol tartrate (LOPRESSOR) 50 MG tablet TAKE 1.5 TABLETS BY MOUTH TWICE DAILY. 270 tablet 0   Multiple Vitamins-Minerals (PRESERVISION AREDS 2+MULTI VIT PO) Take 1 tablet by mouth 2 (two) times daily.     nystatin (MYCOSTATIN/NYSTOP) powder Apply 1 Application topically 2 (two) times daily. 60 g 0   nystatin cream (MYCOSTATIN) Apply 1 Application topically 2 (two) times daily. 30 g 0   omeprazole (PRILOSEC) 20 MG capsule TAKE 1 CAPSULE BY MOUTH ONCE DAILY 90 capsule 3   Polyethyl Glycol-Propyl Glycol (SYSTANE OP) Place 1 drop into both eyes daily as needed (for dry eyes).     polyethylene glycol powder (GLYCOLAX/MIRALAX) 17 GM/SCOOP powder Take 17 g by mouth daily. (Patient taking differently: Take 17 g by mouth as needed.) 850 g 1   spironolactone (ALDACTONE) 25 MG tablet Take 25 mg by mouth daily.     triamcinolone cream (KENALOG) 0.1 % Apply 1 application. topically 2 (two) times daily. 30 g 0   White Petrolatum-Mineral Oil (GENTEAL TEARS NIGHT-TIME OP) Place 1 application into both eyes at bedtime. Night time ointment 3.5g     No current facility-administered medications for this visit.      PHYSICAL EXAM:  General:  Well appearing. No resp difficulty HEENT: normal Neck: supple. JVP flat. Carotids 2+ bilaterally; no bruits. No lymphadenopathy or thryomegaly appreciated. Cor: PMI normal. Regular rate & rhythm. No rubs, gallops or murmurs. Lungs: clear Abdomen: soft, nontender, nondistended. No hepatosplenomegaly. No bruits or masses. Good bowel sounds. Extremities: no cyanosis, clubbing, rash, edema Neuro: alert & orientedx3, cranial nerves grossly intact. Moves all 4 extremities w/o difficulty. Affect pleasant.   ECG:   ASSESSMENT & PLAN:  1: NICM with mildly reduced ejection fraction- - likely due to tachy-brady syndrome - NYHA class III -  euvolemic today - weighing daily; reminded to call for an overnight weight gain of > 2 pounds or a weekly weight gain of > 5 pounds - not  adding salt and does like to cook for herself at home in her own apt; made pinto beans and didn't add any salt but did use salsa afterwards - ReDs clip reading today was 32% - continue furosemide 20mg  daily/ potassium daily - continue metoprolol tartrate 75mg  BID - continue spironolactone 25mg  daily - discussed adding SGLT2 at next visit as she's very overwhelmed right now - could also add entresto in the future - saw cardiology Mariah Milling) 11/23 - wearing compression socks daily with removal at bedtime - due to her loose sounding, barky cough, will get CXR today - BNP 02/04/23 was 1168.6  2: HTN:- - BP 132/76 - saw PCP Lorin Picket) 05/24 - BMP 02/16/23 showed sodium 141, potassium 4.7, creatinine 1.01 & GFR 49.31  3: Atrial fibrillation- - tachy-brady syndrome s/p permanent pacemaker (04/21) - saw EP provider (Riddle) 05/24 - continue amiodarone 200mg  daily - continue apixaban 5mg  BID  4: OSA- - wearing CPAP nightly - has oxygen @ 1L PRN  Return in 1 month, sooner if needed.

## 2023-03-22 ENCOUNTER — Encounter: Payer: Self-pay | Admitting: Family

## 2023-03-22 ENCOUNTER — Ambulatory Visit: Payer: PPO | Attending: Family | Admitting: Family

## 2023-03-22 VITALS — BP 113/69 | HR 82 | Resp 14 | Wt 167.2 lb

## 2023-03-22 DIAGNOSIS — I428 Other cardiomyopathies: Secondary | ICD-10-CM | POA: Diagnosis not present

## 2023-03-22 DIAGNOSIS — G4733 Obstructive sleep apnea (adult) (pediatric): Secondary | ICD-10-CM | POA: Diagnosis not present

## 2023-03-22 DIAGNOSIS — E039 Hypothyroidism, unspecified: Secondary | ICD-10-CM | POA: Diagnosis not present

## 2023-03-22 DIAGNOSIS — F32A Depression, unspecified: Secondary | ICD-10-CM | POA: Diagnosis not present

## 2023-03-22 DIAGNOSIS — Z79899 Other long term (current) drug therapy: Secondary | ICD-10-CM | POA: Diagnosis not present

## 2023-03-22 DIAGNOSIS — Z950 Presence of cardiac pacemaker: Secondary | ICD-10-CM | POA: Insufficient documentation

## 2023-03-22 DIAGNOSIS — I10 Essential (primary) hypertension: Secondary | ICD-10-CM | POA: Diagnosis not present

## 2023-03-22 DIAGNOSIS — K219 Gastro-esophageal reflux disease without esophagitis: Secondary | ICD-10-CM | POA: Insufficient documentation

## 2023-03-22 DIAGNOSIS — Z7901 Long term (current) use of anticoagulants: Secondary | ICD-10-CM | POA: Diagnosis not present

## 2023-03-22 DIAGNOSIS — I509 Heart failure, unspecified: Secondary | ICD-10-CM | POA: Insufficient documentation

## 2023-03-22 DIAGNOSIS — Z853 Personal history of malignant neoplasm of breast: Secondary | ICD-10-CM | POA: Diagnosis not present

## 2023-03-22 DIAGNOSIS — I5022 Chronic systolic (congestive) heart failure: Secondary | ICD-10-CM

## 2023-03-22 DIAGNOSIS — F419 Anxiety disorder, unspecified: Secondary | ICD-10-CM | POA: Diagnosis not present

## 2023-03-22 DIAGNOSIS — I495 Sick sinus syndrome: Secondary | ICD-10-CM | POA: Diagnosis not present

## 2023-03-22 DIAGNOSIS — I110 Hypertensive heart disease with heart failure: Secondary | ICD-10-CM | POA: Diagnosis not present

## 2023-03-22 DIAGNOSIS — I480 Paroxysmal atrial fibrillation: Secondary | ICD-10-CM | POA: Diagnosis not present

## 2023-03-23 ENCOUNTER — Ambulatory Visit: Payer: PPO | Admitting: Urgent Care

## 2023-03-23 ENCOUNTER — Other Ambulatory Visit: Payer: Self-pay

## 2023-03-23 ENCOUNTER — Encounter: Payer: Self-pay | Admitting: Orthopedic Surgery

## 2023-03-23 ENCOUNTER — Encounter
Admission: RE | Disposition: A | Discharge status: Home / Self Care | Payer: Self-pay | Source: Home / Self Care | Attending: Orthopedic Surgery

## 2023-03-23 ENCOUNTER — Ambulatory Visit
Admission: RE | Admit: 2023-03-23 | Discharge: 2023-03-23 | Disposition: A | Discharge status: Home / Self Care | Payer: PPO | Attending: Orthopedic Surgery | Admitting: Orthopedic Surgery

## 2023-03-23 DIAGNOSIS — I700 Atherosclerosis of aorta: Secondary | ICD-10-CM | POA: Diagnosis not present

## 2023-03-23 DIAGNOSIS — I4891 Unspecified atrial fibrillation: Secondary | ICD-10-CM | POA: Diagnosis not present

## 2023-03-23 DIAGNOSIS — Z950 Presence of cardiac pacemaker: Secondary | ICD-10-CM | POA: Diagnosis not present

## 2023-03-23 DIAGNOSIS — Z79899 Other long term (current) drug therapy: Secondary | ICD-10-CM | POA: Insufficient documentation

## 2023-03-23 DIAGNOSIS — Z85850 Personal history of malignant neoplasm of thyroid: Secondary | ICD-10-CM | POA: Insufficient documentation

## 2023-03-23 DIAGNOSIS — Z853 Personal history of malignant neoplasm of breast: Secondary | ICD-10-CM | POA: Insufficient documentation

## 2023-03-23 DIAGNOSIS — F419 Anxiety disorder, unspecified: Secondary | ICD-10-CM | POA: Diagnosis not present

## 2023-03-23 DIAGNOSIS — G5601 Carpal tunnel syndrome, right upper limb: Secondary | ICD-10-CM | POA: Insufficient documentation

## 2023-03-23 DIAGNOSIS — I110 Hypertensive heart disease with heart failure: Secondary | ICD-10-CM | POA: Insufficient documentation

## 2023-03-23 DIAGNOSIS — M1990 Unspecified osteoarthritis, unspecified site: Secondary | ICD-10-CM | POA: Diagnosis not present

## 2023-03-23 DIAGNOSIS — F32A Depression, unspecified: Secondary | ICD-10-CM | POA: Diagnosis not present

## 2023-03-23 DIAGNOSIS — I495 Sick sinus syndrome: Secondary | ICD-10-CM | POA: Insufficient documentation

## 2023-03-23 DIAGNOSIS — K219 Gastro-esophageal reflux disease without esophagitis: Secondary | ICD-10-CM | POA: Diagnosis not present

## 2023-03-23 DIAGNOSIS — E7800 Pure hypercholesterolemia, unspecified: Secondary | ICD-10-CM | POA: Diagnosis not present

## 2023-03-23 DIAGNOSIS — I4819 Other persistent atrial fibrillation: Secondary | ICD-10-CM | POA: Diagnosis not present

## 2023-03-23 DIAGNOSIS — Z85820 Personal history of malignant melanoma of skin: Secondary | ICD-10-CM | POA: Diagnosis not present

## 2023-03-23 DIAGNOSIS — I509 Heart failure, unspecified: Secondary | ICD-10-CM | POA: Diagnosis not present

## 2023-03-23 DIAGNOSIS — G4733 Obstructive sleep apnea (adult) (pediatric): Secondary | ICD-10-CM | POA: Insufficient documentation

## 2023-03-23 DIAGNOSIS — I2510 Atherosclerotic heart disease of native coronary artery without angina pectoris: Secondary | ICD-10-CM | POA: Diagnosis not present

## 2023-03-23 DIAGNOSIS — E890 Postprocedural hypothyroidism: Secondary | ICD-10-CM | POA: Insufficient documentation

## 2023-03-23 DIAGNOSIS — K449 Diaphragmatic hernia without obstruction or gangrene: Secondary | ICD-10-CM | POA: Insufficient documentation

## 2023-03-23 HISTORY — DX: Left bundle-branch block, unspecified: I44.7

## 2023-03-23 HISTORY — PX: CARPAL TUNNEL RELEASE: SHX101

## 2023-03-23 HISTORY — DX: Other long term (current) drug therapy: Z79.899

## 2023-03-23 HISTORY — DX: Carpal tunnel syndrome, unspecified upper limb: G56.00

## 2023-03-23 HISTORY — DX: Essential tremor: G25.0

## 2023-03-23 HISTORY — DX: Long term (current) use of anticoagulants: Z79.01

## 2023-03-23 HISTORY — DX: Atherosclerotic heart disease of native coronary artery without angina pectoris: I25.10

## 2023-03-23 HISTORY — DX: Diaphragmatic hernia without obstruction or gangrene: K44.9

## 2023-03-23 HISTORY — DX: Other cervical disc degeneration, unspecified cervical region: M50.30

## 2023-03-23 HISTORY — DX: Atherosclerosis of aorta: I70.0

## 2023-03-23 HISTORY — DX: Vitamin D deficiency, unspecified: E55.9

## 2023-03-23 HISTORY — DX: Sick sinus syndrome: I49.5

## 2023-03-23 SURGERY — CARPAL TUNNEL RELEASE
Anesthesia: General | Laterality: Right

## 2023-03-23 MED ORDER — ONDANSETRON HCL 4 MG PO TABS: 4.0000 mg | ORAL_TABLET | Freq: Three times a day (TID) | ORAL | 0 refills | Status: DC | PRN

## 2023-03-23 MED ORDER — PROPOFOL 10 MG/ML IV BOLUS
INTRAVENOUS | Status: DC | PRN
  Administered 2023-03-23: 80 mg via INTRAVENOUS

## 2023-03-23 MED ORDER — SEVOFLURANE IN SOLN
RESPIRATORY_TRACT | Status: AC
  Filled 2023-03-23: qty 250

## 2023-03-23 MED ORDER — 0.9 % SODIUM CHLORIDE (POUR BTL) OPTIME
TOPICAL | Status: DC | PRN
  Administered 2023-03-23: 500 mL

## 2023-03-23 MED ORDER — ACETAMINOPHEN 500 MG PO TABS
1000.0000 mg | ORAL_TABLET | ORAL | Status: AC
  Administered 2023-03-23: 1000 mg via ORAL

## 2023-03-23 MED ORDER — OXYCODONE HCL 5 MG PO TABS
ORAL_TABLET | ORAL | Status: AC
  Filled 2023-03-23: qty 1

## 2023-03-23 MED ORDER — PROPOFOL 10 MG/ML IV BOLUS
INTRAVENOUS | Status: AC
  Filled 2023-03-23: qty 20

## 2023-03-23 MED ORDER — CHLORHEXIDINE GLUCONATE CLOTH 2 % EX PADS
6.0000 | MEDICATED_PAD | Freq: Once | CUTANEOUS | Status: AC
  Administered 2023-03-23: 6 via TOPICAL

## 2023-03-23 MED ORDER — FENTANYL CITRATE (PF) 100 MCG/2ML IJ SOLN
INTRAMUSCULAR | Status: DC | PRN
  Administered 2023-03-23 (×2): 50 ug via INTRAVENOUS

## 2023-03-23 MED ORDER — ORAL CARE MOUTH RINSE: 15.0000 mL | Freq: Once | OROMUCOSAL | Status: AC

## 2023-03-23 MED ORDER — ONDANSETRON HCL 4 MG/2ML IJ SOLN
INTRAMUSCULAR | Status: AC
  Filled 2023-03-23: qty 2

## 2023-03-23 MED ORDER — CEFAZOLIN SODIUM-DEXTROSE 2-4 GM/100ML-% IV SOLN
INTRAVENOUS | Status: AC
  Filled 2023-03-23: qty 100

## 2023-03-23 MED ORDER — ONDANSETRON HCL 4 MG/2ML IJ SOLN: 4.0000 mg | Freq: Once | INTRAMUSCULAR | Status: DC | PRN

## 2023-03-23 MED ORDER — PHENYLEPHRINE 80 MCG/ML (10ML) SYRINGE FOR IV PUSH (FOR BLOOD PRESSURE SUPPORT)
PREFILLED_SYRINGE | INTRAVENOUS | Status: AC
  Filled 2023-03-23: qty 10

## 2023-03-23 MED ORDER — ONDANSETRON HCL 4 MG/2ML IJ SOLN
INTRAMUSCULAR | Status: DC | PRN
  Administered 2023-03-23: 4 mg via INTRAVENOUS

## 2023-03-23 MED ORDER — LIDOCAINE HCL (CARDIAC) PF 100 MG/5ML IV SOSY
PREFILLED_SYRINGE | INTRAVENOUS | Status: DC | PRN
  Administered 2023-03-23: 100 mg via INTRAVENOUS

## 2023-03-23 MED ORDER — OXYCODONE HCL 5 MG PO TABS: 5.0000 mg | ORAL_TABLET | ORAL | 0 refills | Status: DC | PRN

## 2023-03-23 MED ORDER — CHLORHEXIDINE GLUCONATE 0.12 % MT SOLN
15.0000 mL | Freq: Once | OROMUCOSAL | Status: AC
  Administered 2023-03-23: 15 mL via OROMUCOSAL

## 2023-03-23 MED ORDER — CHLORHEXIDINE GLUCONATE 0.12 % MT SOLN
OROMUCOSAL | Status: AC
  Filled 2023-03-23: qty 15

## 2023-03-23 MED ORDER — OXYCODONE HCL 5 MG/5ML PO SOLN: 5.0000 mg | Freq: Once | ORAL | Status: AC | PRN

## 2023-03-23 MED ORDER — FENTANYL CITRATE (PF) 100 MCG/2ML IJ SOLN
INTRAMUSCULAR | Status: AC
  Filled 2023-03-23: qty 2

## 2023-03-23 MED ORDER — DEXAMETHASONE SODIUM PHOSPHATE 10 MG/ML IJ SOLN
INTRAMUSCULAR | Status: DC | PRN
  Administered 2023-03-23: 5 mg via INTRAVENOUS

## 2023-03-23 MED ORDER — OXYCODONE HCL 5 MG PO TABS
5.0000 mg | ORAL_TABLET | Freq: Once | ORAL | Status: AC | PRN
  Administered 2023-03-23: 5 mg via ORAL

## 2023-03-23 MED ORDER — LACTATED RINGERS IV SOLN: INTRAVENOUS | Status: DC

## 2023-03-23 MED ORDER — CEFAZOLIN SODIUM-DEXTROSE 2-4 GM/100ML-% IV SOLN
2.0000 g | INTRAVENOUS | Status: AC
  Administered 2023-03-23: 2 g via INTRAVENOUS

## 2023-03-23 MED ORDER — LIDOCAINE HCL (PF) 2 % IJ SOLN
INTRAMUSCULAR | Status: AC
  Filled 2023-03-23: qty 5

## 2023-03-23 MED ORDER — DEXAMETHASONE SODIUM PHOSPHATE 10 MG/ML IJ SOLN
INTRAMUSCULAR | Status: AC
  Filled 2023-03-23: qty 1

## 2023-03-23 MED ORDER — PHENYLEPHRINE HCL (PRESSORS) 10 MG/ML IV SOLN
INTRAVENOUS | Status: DC | PRN
  Administered 2023-03-23: 80 ug via INTRAVENOUS

## 2023-03-23 MED ORDER — ACETAMINOPHEN 500 MG PO TABS
ORAL_TABLET | ORAL | Status: AC
  Filled 2023-03-23: qty 2

## 2023-03-23 MED ORDER — FENTANYL CITRATE (PF) 100 MCG/2ML IJ SOLN
25.0000 ug | INTRAMUSCULAR | Status: DC | PRN
  Administered 2023-03-23: 50 ug via INTRAVENOUS

## 2023-03-23 SURGICAL SUPPLY — 46 items
BLADE SURG MINI STRL (BLADE) ×1 IMPLANT
BNDG CMPR STD VLCR NS LF 5.8X4 (GAUZE/BANDAGES/DRESSINGS) ×2
BNDG ELASTIC 4X5.8 VLCR NS LF (GAUZE/BANDAGES/DRESSINGS) ×2 IMPLANT
BNDG ESMARCH 4 X 12 STRL LF (GAUZE/BANDAGES/DRESSINGS) ×1
BNDG ESMARCH 4X12 STRL LF (GAUZE/BANDAGES/DRESSINGS) ×1 IMPLANT
CORD BIP STRL DISP 12FT (MISCELLANEOUS) ×1 IMPLANT
CUFF TOURN SGL QUICK 18X4 (TOURNIQUET CUFF) IMPLANT
DRAPE ORTHO SPLIT 77X108 STRL (DRAPES) ×1
DRAPE SURG 17X11 SM STRL (DRAPES) ×1 IMPLANT
DRAPE SURG ORHT 6 SPLT 77X108 (DRAPES) ×1 IMPLANT
DRSG GAUZE FLUFF 36X18 (GAUZE/BANDAGES/DRESSINGS) ×1 IMPLANT
DURAPREP 26ML APPLICATOR (WOUND CARE) ×2 IMPLANT
ELECT REM PT RETURN 9FT ADLT (ELECTROSURGICAL) ×1
ELECTRODE REM PT RTRN 9FT ADLT (ELECTROSURGICAL) ×1 IMPLANT
FORCEPS JEWEL BIP 4-3/4 STR (INSTRUMENTS) ×1 IMPLANT
GAUZE SPONGE 4X4 12PLY STRL (GAUZE/BANDAGES/DRESSINGS) ×1 IMPLANT
GAUZE XEROFORM 1X8 LF (GAUZE/BANDAGES/DRESSINGS) ×1 IMPLANT
GLOVE BIOGEL PI IND STRL 9 (GLOVE) ×1 IMPLANT
GLOVE BIOGEL PI ORTHO SZ9 (GLOVE) ×4 IMPLANT
GOWN STRL REUS TWL 2XL XL LVL4 (GOWN DISPOSABLE) ×1 IMPLANT
GOWN STRL REUS W/ TWL LRG LVL3 (GOWN DISPOSABLE) ×1 IMPLANT
GOWN STRL REUS W/TWL LRG LVL3 (GOWN DISPOSABLE) ×1
KIT TURNOVER KIT A (KITS) ×1 IMPLANT
MANIFOLD NEPTUNE II (INSTRUMENTS) ×1 IMPLANT
NDL FILTER BLUNT 18X1 1/2 (NEEDLE) ×1 IMPLANT
NEEDLE FILTER BLUNT 18X1 1/2 (NEEDLE) ×1 IMPLANT
NS IRRIG 500ML POUR BTL (IV SOLUTION) ×1 IMPLANT
PACK EXTREMITY ARMC (MISCELLANEOUS) ×1 IMPLANT
PAD CAST 4YDX4 CTTN HI CHSV (CAST SUPPLIES) ×2 IMPLANT
PADDING CAST BLEND 4X4 STRL (MISCELLANEOUS) ×3 IMPLANT
PADDING CAST COTTON 4X4 STRL (CAST SUPPLIES) ×2
SLING ARM LRG DEEP (SOFTGOODS) ×1 IMPLANT
SLING ARM M TX990204 (SOFTGOODS) ×1 IMPLANT
SPLINT CAST 1 STEP 3X12 (MISCELLANEOUS) ×1 IMPLANT
STOCKINETTE 48X4 2 PLY STRL (GAUZE/BANDAGES/DRESSINGS) ×1 IMPLANT
STOCKINETTE STRL 4IN 9604848 (GAUZE/BANDAGES/DRESSINGS) ×1 IMPLANT
STRIP CLOSURE SKIN 1/2X4 (GAUZE/BANDAGES/DRESSINGS) ×1 IMPLANT
SUT ETHILON 3-0 FS-10 30 BLK (SUTURE) ×1
SUT ETHILON 4-0 (SUTURE) ×1
SUT ETHILON 4-0 FS2 18XMFL BLK (SUTURE) ×1
SUT ETHILON 5-0 FS-2 18 BLK (SUTURE) ×1 IMPLANT
SUTURE EHLN 3-0 FS-10 30 BLK (SUTURE) ×1 IMPLANT
SUTURE ETHLN 4-0 FS2 18XMF BLK (SUTURE) ×1 IMPLANT
SYR 3ML LL SCALE MARK (SYRINGE) ×1 IMPLANT
TRAP FLUID SMOKE EVACUATOR (MISCELLANEOUS) ×1 IMPLANT
WATER STERILE IRR 500ML POUR (IV SOLUTION) ×1 IMPLANT

## 2023-03-23 NOTE — Anesthesia Preprocedure Evaluation (Addendum)
Anesthesia Evaluation  Patient identified by MRN, date of birth, ID band Patient awake    Reviewed: Allergy & Precautions, NPO status , Patient's Chart, lab work & pertinent test results  History of Anesthesia Complications Negative for: history of anesthetic complications  Airway Mallampati: III  TM Distance: >3 FB Neck ROM: Full    Dental  (+) Chipped   Pulmonary sleep apnea and Continuous Positive Airway Pressure Ventilation , neg COPD, Patient abstained from smoking.Not current smoker   Pulmonary exam normal breath sounds clear to auscultation       Cardiovascular Exercise Tolerance: Good METShypertension, Pt. on medications + CAD and +CHF  (-) Past MI + dysrhythmias Atrial Fibrillation + pacemaker + Valvular Problems/Murmurs  Rhythm:Regular Rate:Normal - Systolic murmurs Per PAT note:  Patient with a history of sick sinus syndrome necessitating placement of a permanent pacemaker.  She underwent placement of a Saint Jude Assurity MRI device on 01/10/2020.  Device is regularly interrogated by her primary electrophysiology team.  Last interrogation was on 02/12/2023, at which time device was noted to be functioning properly.    Long-term cardiac event monitor study was performed on 06/20/2022 revealing a predominant underlying paced rhythm.  Frequent PVCs noted accounting for a 22.1% study burden.  There were no patient triggered events.    Most recent TTE was performed on 01/24/2023 revealing a mildly to moderately reduced left ventricular systolic function with an EF of 40-45%.  There was global hypokinesis.  Moderate LVH noted.  Diastolic Doppler parameters were indeterminate.  Right ventricular size and function normal.  Mildly elevated PASP of 42.9 mmHg.  There was mild to moderate mitral valve regurgitation.  Aortic valve was sclerotic.  All transvalvular gradients were noted to be normal providing no evidence suggestive of  valvular stenosis.  Ascending aorta borderline dilated measuring 37 mm.    Most recent myocardial perfusion imaging study performed on 02/03/2023 revealed a mild to moderately reduced left ventricular systolic function with an EF of 40%.  Small region of apical hypokinesis noted.  Paced rhythm observed throughout the study.  CT attenuation correction images demonstrated diffuse aortic atherosclerosis and minimal coronary artery calcifications.  There was a small region of fixed defect in the distal anterior and apical region suggestive of attenuation artifact versus small prior MI.  There was no evidence of significant ischemia.  Study determined to be low risk overall.      Neuro/Psych  PSYCHIATRIC DISORDERS Anxiety Depression     Neuromuscular disease    GI/Hepatic hiatal hernia, PUD,GERD  Medicated and Controlled,,(+)     (-) substance abuse    Endo/Other  neg diabetesHypothyroidism    Renal/GU negative Renal ROS     Musculoskeletal  (+) Arthritis ,    Abdominal   Peds  Hematology   Anesthesia Other Findings Past Medical History: No date: Allergy No date: Anxiety No date: Aortic atherosclerosis (HCC) No date: Arthritis 01/24/2023: Ascending aorta dilatation (HCC)     Comment:  a.) TTE 01/24/2023: asc Ao 37 mm (borderline) No date: Atypical chest pain No date: Benign essential tremor No date: CAD (coronary artery disease)     Comment:  a.) MV 09/30/2017: EF 55-65%, small perfusion defect in               apical septal and apex location --> attenuation artifact               vs small area of ischemia; b.) TTE 11/17/2019: EF 68%,  small fixed apical defect possibly 2/2 attenuation               artifact, no ischemia; c.) MV 02/03/2023: EF 40, apical               HK, small fixed defect in distal anterior and apical               region --> attentuation artifact vs small prior MI, no               sig ischemia No date: Carpal tunnel syndrome No date: CHF  (congestive heart failure) (HCC)     Comment:  a.) TTE 09/30/2017: EF 55-60%, mod LAE, mild TR, G1DD;               b.) TTE 12/10/2018: EF 55-60%, mild LVH, mild AoV               sclerosis, G1DD; c.) TTE 10/21/2019: EF 55-60%, mod LVH,               mild LAE, triv MR/TR, mild-mod AoV sclerosis; d.)  TTE               01/24/2023: EF 40-45%, glob HK, mild-mod TR, mild AoV               sclerosis, PASP 42.9 No date: Clotting disorder (HCC) No date: Colitis No date: DDD (degenerative disc disease), cervical No date: Depression 2013: Diverticulitis No date: Gastric ulcer No date: GERD (gastroesophageal reflux disease) No date: Hiatal hernia No date: Hypercholesterolemia No date: Hypertension No date: Hypothyroidism 2011: Infiltrating lobular carcinoma of left breast     Comment:  a.) T2,N0, ER: 90%; PR 0%; Her 2 neu not amplified.               [facility]); s/p lumpectomy + aduvant XRT No date: LBBB (left bundle branch block) No date: Long term current use of amiodarone No date: Long term current use of anticoagulant     Comment:  a.) apixaban No date: Macular degeneration 1997: Melanoma (HCC) 03/19/2011: Melanoma in situ of upper extremity (HCC) No date: Obstructive sleep apnea on CPAP No date: Persistent atrial fibrillation (HCC)     Comment:  a.) CHA2DS2VASc = 6 (age x2, sex, CHF, HTN, vascular               disease history);  b.) rate/rhythm maintained on oral               amiodarone + metoprolol tartrate; chronically               anticoagulated with apixaban 01/10/2020: Presence of permanent cardiac pacemaker     Comment:  a.) s/p placement 01/10/2020 for SSS; St Jude Medical               Assurity MRI  model [id number] (SN: [id number] ) 2011: Seroma of LEFT breast     Comment:  a.) postoperative complication following lumpectomy No date: SSS (sick sinus syndrome) (HCC)     Comment:  a.) s/p PPM placement 01/10/2020 1992: Thyroid cancer (HCC)     Comment:  a.) s/p  partial thyroidectomy No date: Vitamin D deficiency  Reproductive/Obstetrics                              Anesthesia Physical Anesthesia Plan  ASA: 3  Anesthesia Plan: General   Post-op Pain Management: Tylenol PO (pre-op)*  Induction: Intravenous  PONV Risk Score and Plan: 3 and Ondansetron, Dexamethasone and Treatment may vary due to age or medical condition  Airway Management Planned: LMA  Additional Equipment: None  Intra-op Plan:   Post-operative Plan: Extubation in OR  Informed Consent: I have reviewed the patients History and Physical, chart, labs and discussed the procedure including the risks, benefits and alternatives for the proposed anesthesia with the patient or authorized representative who has indicated his/her understanding and acceptance.     Dental advisory given  Plan Discussed with: CRNA and Surgeon  Anesthesia Plan Comments: (Discussed risks of anesthesia with patient and daughter at bedside, including PONV, sore throat, lip/dental/eye damage, post operative cognitive dysfunction. Rare risks discussed as well, such as cardiorespiratory and neurological sequelae, and allergic reactions. Discussed the role of CRNA in patient's perioperative care. Patient understands.  After last anesthetic for ORIF distal radius fracture, she received a rescue axillary block in PACU to good effect, per patient. Discussed r/b/a of potential post operative axillary nerve block, including:  - bleeding, infection, nerve damage - poor or non functioning block. - reactions and toxicity to local anesthetic Patient understands. Does not desire block preop but would consider post op if needed. )         Anesthesia Quick Evaluation

## 2023-03-23 NOTE — Op Note (Signed)
  03/23/2023  3:04 PM  PATIENT:  Kerri Mills    PRE-OPERATIVE DIAGNOSIS:  right carpal tunnel syndrome  POST-OPERATIVE DIAGNOSIS:  Same  PROCEDURE:  RIGHT OPEN CARPAL TUNNEL RELEASE  SURGEON:  Juanell Fairly, MD  ANESTHESIA:   General  PREOPERATIVE INDICATIONS:  Kerri Mills is a  89 y.o. female with a diagnosis of right carpal tunnel syndrome who failed conservative measures and elected for surgical management.    I discussed the risks and benefits of surgery. The risks include but are not limited to infection, bleeding, nerve or blood vessel injury, joint stiffness or loss of motion, persistent pain, weakness, recurrence of symptoms and the need for further surgery. Patient understood these risks and wished to proceed.   OPERATIVE FINDINGS: Significant median nerve compression at the carpal tunnel, right upper extremity  OPERATIVE PROCEDURE: Patient was met in the preoperative area. I signed the right wrist with my initials and the word yes according the hospital's correct site of surgery protocol. The H&P was updated. I answered all the patient's questions. The patient was then brought to the operating room where she underwent general anesthesia.  She was positioned supine on the operative table. The right arm was placed on a hand table. A tourniquet was applied to the right upper extremity. The right upper extremity was prepped and draped in a sterile fashion. A timeout was performed to verify the patient's name, date of birth, medical record number, correct site of surgery correct procedure to be performed. The time out was also used to confirm the patient received antibiotics that all necessary instruments were available in the room. The right upper extremity was then exsanguinated with an Esmarch and the tourniquet inflated to 250 mmHg.   An incision following the palmar crease was made. This was made in line with the web space between the middle and ring fingers and the distal extent  of the incision was where it intersected Kaplan's cardinal line. Bleeding vessels were cauterized with a bipolar.  The subcutaneous tissue was carefully dissected out with a Metzenbaum scissor and pickup until the palmar fascia was encountered. The distal extent of the transverse carpal ligament was then identified. A Freer elevator was placed under the transverse carpal ligament running distally to proximally. A micro-Beaver blade was then used to incise the transverse carpal ligament taking care to avoid injury to any neurovascular structures. The carpal tunnel was found to be extremely constricted. There was significant compression on the median nerve. The transverse carpal ligament was completely released. The nerve was visualized in its entirety and the carpal tunnel. The wound was copiously irrigated. The skin was then approximated with 5-0 nylon. Xeroform was placed over the incision. A dry sterile dressing was applied along with a volar fiberglass splint. Patient was overwrapped with an Ace wrap. The tourniquet was deflated at 32 minutes.  Sling was placed on the right upper extremity. She was extubated and brought to the PACU in stable condition. I was scrubbed and present the entire case and all sharp and instrument counts were correct at the conclusion the case.   I spoke with the patient's daughter by phone from the PACU to let her know the case had been performed without complication and the patient was stable in recovery room.     Kathreen Devoid, MD

## 2023-03-23 NOTE — Transfer of Care (Signed)
Immediate Anesthesia Transfer of Care Note  Patient: Kerri Mills  Procedure(s) Performed: CARPAL TUNNEL RELEASE (Right)  Patient Location: PACU  Anesthesia Type:General  Level of Consciousness: drowsy  Airway & Oxygen Therapy: Patient Spontanous Breathing and Patient connected to face mask oxygen  Post-op Assessment: Report given to RN and Post -op Vital signs reviewed and stable  Post vital signs: Reviewed and stable  Last Vitals:  Vitals Value Taken Time  BP 179/99 03/23/23 1402  Temp 35.9 1402  Pulse 79 03/23/23 1406  Resp 15 03/23/23 1406  SpO2 100 % 03/23/23 1406  Vitals shown include unvalidated device data.  Last Pain:  Vitals:   03/23/23 1052  TempSrc: Temporal  PainSc: 0-No pain         Complications: No notable events documented.

## 2023-03-23 NOTE — Anesthesia Postprocedure Evaluation (Signed)
Anesthesia Post Note  Patient: Kerri Mills  Procedure(s) Performed: CARPAL TUNNEL RELEASE (Right)  Patient location during evaluation: PACU Anesthesia Type: General Level of consciousness: awake and alert Pain management: pain level controlled Vital Signs Assessment: post-procedure vital signs reviewed and stable Respiratory status: spontaneous breathing, nonlabored ventilation and respiratory function stable Cardiovascular status: blood pressure returned to baseline and stable Postop Assessment: no apparent nausea or vomiting Anesthetic complications: no   No notable events documented.   Last Vitals:  Vitals:   03/23/23 1430 03/23/23 1451  BP: 108/75 116/80  Pulse: 81 83  Resp: 19 18  Temp: (!) 36.3 C   SpO2: 95% 93%    Last Pain:  Vitals:   03/23/23 1451  TempSrc:   PainSc: 2                  Foye Deer

## 2023-03-23 NOTE — H&P (Signed)
PREOPERATIVE H&P  Chief Complaint: right carpal tunnel syndrome  HPI: Kerri Mills is a 89 y.o. female who presents for preoperative history and physical with a diagnosis of right carpal tunnel syndrome.  Patient's symptoms began after sustaining a distal radius fracture requiring ORIF on 12/06/2022.  Her symptoms of carpal tunnel syndrome have persisted despite successful healing of her right distal radius fracture.  Patient feels numbness in the thumb index and middle finger and is having issues with dexterity of her fingers.  She has failed nonoperative management wished to proceed with a right open carpal tunnel release.  Past Medical History:  Diagnosis Date   Allergy    Anxiety    Aortic atherosclerosis (HCC)    Arthritis    Ascending aorta dilatation (HCC) 01/24/2023   a.) TTE 01/24/2023: asc Ao 37 mm (borderline)   Atypical chest pain    Benign essential tremor    CAD (coronary artery disease)    a.) MV 09/30/2017: EF 55-65%, small perfusion defect in apical septal and apex location --> attenuation artifact vs small area of ischemia; b.) TTE 11/17/2019: EF 68%, small fixed apical defect possibly 2/2 attenuation artifact, no ischemia; c.) MV 02/03/2023: EF 40, apical HK, small fixed defect in distal anterior and apical region --> attentuation artifact vs small prior MI, no sig ischemia   Carpal tunnel syndrome    CHF (congestive heart failure) (HCC)    a.) TTE 09/30/2017: EF 55-60%, mod LAE, mild TR, G1DD; b.) TTE 12/10/2018: EF 55-60%, mild LVH, mild AoV sclerosis, G1DD; c.) TTE 10/21/2019: EF 55-60%, mod LVH, mild LAE, triv MR/TR, mild-mod AoV sclerosis; d.)  TTE 01/24/2023: EF 40-45%, glob HK, mild-mod TR, mild AoV sclerosis, PASP 42.9   Clotting disorder (HCC)    Colitis    DDD (degenerative disc disease), cervical    Depression    Diverticulitis 2013   Gastric ulcer    GERD (gastroesophageal reflux disease)    Hiatal hernia    Hypercholesterolemia    Hypertension     Hypothyroidism    Infiltrating lobular carcinoma of left breast 2011   a.) T2,N0, ER: 90%; PR 0%; Her 2 neu not amplified. [facility]); s/p lumpectomy + aduvant XRT   LBBB (left bundle branch block)    Long term current use of amiodarone    Long term current use of anticoagulant    a.) apixaban   Macular degeneration    Melanoma (HCC) 1997   Melanoma in situ of upper extremity (HCC) 03/19/2011   Obstructive sleep apnea on CPAP    Persistent atrial fibrillation (HCC)    a.) CHA2DS2VASc = 6 (age x2, sex, CHF, HTN, vascular disease history);  b.) rate/rhythm maintained on oral amiodarone + metoprolol tartrate; chronically anticoagulated with apixaban   Presence of permanent cardiac pacemaker 01/10/2020   a.) s/p placement 01/10/2020 for SSS; [employer] Jude Medical Assurity MRI  model [id number] (SN: [id number] )   Seroma of LEFT breast 2011   a.) postoperative complication following lumpectomy   SSS (sick sinus syndrome) (HCC)    a.) s/p PPM placement 01/10/2020   Thyroid cancer (HCC) 1992   a.) s/p partial thyroidectomy   Vitamin D deficiency    Past Surgical History:  Procedure Laterality Date   ABDOMINAL HYSTERECTOMY  1973   partial   BREAST BIOPSY Left 02/13/2013   BENIGN BREAST TISSUE WITH CHANGES CONSISTENT WITH FAT NECROSIS   BREAST BIOPSY Left 01/21/2015   bx done in [facility] 11:00 left 6-8cmfn  BREAST EXCISIONAL BIOPSY Left 1995   neg   BREAST EXCISIONAL BIOPSY Left 2011   Breast cancer radiation   BREAST LUMPECTOMY Left 2011   BREAST CA   CARDIAC CATHETERIZATION     CHOLECYSTECTOMY     COLONOSCOPY  2013   COLONOSCOPY WITH PROPOFOL N/A 06/09/2021   Procedure: COLONOSCOPY WITH PROPOFOL;  Surgeon: Wyline Mood, MD;  Location: [facility] ENDOSCOPY;  Service: Gastroenterology;  Laterality: N/A;   ESOPHAGOGASTRODUODENOSCOPY  2013   HAMMER TOE SURGERY Bilateral 02/24/2022   Procedure: HAMMER TOE CORRECTION 2, 3, 4 Right and 2nd Left;  Surgeon: Felecia Shelling, DPM;  Location:  [facility] ORS;  Service: Podiatry;  Laterality: Bilateral;   MELANOMA EXCISION     RT UPPER ARM   OPEN REDUCTION INTERNAL FIXATION (ORIF) DISTAL RADIAL FRACTURE Right 12/06/2022   Procedure: OPEN REDUCTION INTERNAL FIXATION (ORIF) DISTAL RADIUS FRACTURE;  Surgeon: Juanell Fairly, MD;  Location: [facility] ORS;  Service: Orthopedics;  Laterality: Right;   PACEMAKER IMPLANT N/A 01/10/2020   Procedure: PACEMAKER IMPLANT;  Surgeon: Regan Lemming, MD;  Location: [facility] INVASIVE CV LAB;  Service: Cardiovascular;  Laterality: N/A;   PARTIAL HYSTERECTOMY     bleeding, ovaries in place.     THYROIDECTOMY, PARTIAL  1992   FOR THYROID CANCER   TONSILLECTOMY     Social History   Socioeconomic History   Marital status: Widowed    Spouse name: Not on file   Number of children: Not on file   Years of education: Not on file   Highest education level: Not on file  Occupational History   Not on file  Tobacco Use   Smoking status: Never   Smokeless tobacco: Never   Tobacco comments:    never  Vaping Use   Vaping Use: Never used  Substance and Sexual Activity   Alcohol use: Not Currently    Comment: once in a while   Drug use: No   Sexual activity: Not Currently  Other Topics Concern   Not on file  Social History Narrative   Independent and baseline. Lives by herself   Social Determinants of Health   Financial Resource Strain: Low Risk  (01/30/2023)   Overall Financial Resource Strain (CARDIA)    Difficulty of Paying Living Expenses: Not hard at all  Food Insecurity: No Food Insecurity (01/30/2023)   Hunger Vital Sign    Worried About Running Out of Food in the Last Year: Never true    Ran Out of Food in the Last Year: Never true  Transportation Needs: No Transportation Needs (01/30/2023)   PRAPARE - Administrator, Civil Service (Medical): No    Lack of Transportation (Non-Medical): No  Physical Activity: Sufficiently Active (02/08/2022)   Exercise Vital Sign    Days of Exercise per  Week: 4 days    Minutes of Exercise per Session: 40 min  Stress: No Stress Concern Present (01/30/2023)   [employer] of Occupational Health - Occupational Stress Questionnaire    Feeling of Stress : Not at all  Social Connections: Socially Integrated (01/30/2023)   Social Connection and Isolation Panel [NHANES]    Frequency of Communication with Friends and Family: More than three times a week    Frequency of Social Gatherings with Friends and Family: More than three times a week    Attends Religious Services: More than 4 times per year    Active Member of [employer] or Organizations: Yes    Attends Banker Meetings: More than 4  times per year    Marital Status: Married   Family History  Problem Relation Age of Onset   Heart disease Mother    Cancer Sister        breast   Cancer Brother        lung    Breast cancer Neg Hx    Allergies  Allergen Reactions   Lipitor [Atorvastatin Calcium] Other (See Comments)    Stiffness & soreness   Penicillins Rash   Prior to Admission medications   Medication Sig Start Date End Date Taking? Authorizing Provider  acetaminophen (TYLENOL) 650 MG CR tablet Take 650 mg by mouth every 8 (eight) hours as needed for pain.   Yes [provider]  amiodarone (PACERONE) 200 MG tablet TAKE 1 TABLET BY MOUTH DAILY 02/06/23  Yes Camnitz, Andree Coss, MD  cetirizine (ZYRTEC) 5 MG tablet Take 1 tablet (5 mg total) by mouth daily. 11/22/22  Yes Dgayli, Lianne Bushy, MD  DULoxetine (CYMBALTA) 60 MG capsule TAKE ONE CAPSULE AT BEDTIME 02/23/23  Yes Dale [place], MD  furosemide (LASIX) 20 MG tablet Take 20 mg by mouth daily.   Yes [provider]  gabapentin (NEURONTIN) 300 MG capsule Take 2 capsules (600 mg total) by mouth at bedtime. 12/19/22  Yes Dale [place], MD  levothyroxine (SYNTHROID) 125 MCG tablet Take 1 tablet (125 mcg total) by mouth daily. 11/10/22  Yes Dale [place], MD  lovastatin (MEVACOR) 40 MG tablet TAKE 1 TABLET BY  MOUTH DAILY Patient taking differently: Take 40 mg by mouth at bedtime. 03/11/22  Yes Sherlene Shams, MD  metoprolol tartrate (LOPRESSOR) 50 MG tablet TAKE 1.5 TABLETS BY MOUTH TWICE DAILY. 03/01/23  Yes Antonieta Iba, MD  nystatin (MYCOSTATIN/NYSTOP) powder Apply 1 Application topically 2 (two) times daily. 02/16/23  Yes Dale [place], MD  nystatin cream (MYCOSTATIN) Apply 1 Application topically 2 (two) times daily. 02/16/23  Yes Dale [place], MD  omeprazole (PRILOSEC) 20 MG capsule TAKE 1 CAPSULE BY MOUTH ONCE DAILY 08/17/22  Yes Dale [place], MD  Polyethyl Glycol-Propyl Glycol (SYSTANE OP) Place 1 drop into both eyes daily as needed (for dry eyes).   Yes [provider]  polyethylene glycol powder (GLYCOLAX/MIRALAX) 17 GM/SCOOP powder Take 17 g by mouth daily. Patient taking differently: Take 17 g by mouth as needed. 09/10/20  Yes Dale , MD  spironolactone (ALDACTONE) 25 MG tablet Take 25 mg by mouth daily. 02/07/23  Yes [provider]  White Petrolatum-Mineral Oil (GENTEAL TEARS NIGHT-TIME OP) Place 1 application into both eyes at bedtime. Night time ointment 3.5g   Yes [provider]  cholecalciferol (VITAMIN D3) 25 MCG (1000 UNIT) tablet Take 2,000 Units by mouth daily.    [provider]  ELIQUIS 5 MG TABS tablet TAKE ONE TABLET BY MOUTH TWICE DAILY 02/27/23   Antonieta Iba, MD  Multiple Vitamins-Minerals (PRESERVISION AREDS 2+MULTI VIT PO) Take 1 tablet by mouth 2 (two) times daily.    [provider]     Positive ROS: All other systems have been reviewed and were otherwise negative with the exception of those mentioned in the HPI and as above.  Physical Exam: General: Alert, no acute distress Cardiovascular: Regular rate and rhythm, no murmurs rubs or gallops.  No pedal edema Respiratory: Clear to auscultation bilaterally, no wheezes rales or rhonchi. No cyanosis, no use of accessory musculature GI: No organomegaly,  abdomen is soft and non-tender nondistended with positive bowel sounds. Skin: Skin intact, no lesions within the operative field.  Neurologic: Sensation intact distally Psychiatric: Patient is competent for consent with normal mood and affect Lymphatic: No cervical lymphadenopathy  MUSCULOSKELETAL: Right hand: Patient's skin is intact there is no erythema ecchymosis or effusion.  She has mild thenar atrophy.  She has intact sensation light touch in all 5 digits of the right hand but her thumb index and middle finger feel different than in her left hand.  She also has intact sensation to light touch in her small finger and the ulnar side of her ring finger.  She has no interosseous weakness.  Her fingers are well-perfused and she has a palpable radial pulse.  Assessment: right carpal tunnel syndrome  Plan: Plan for Procedure(s): RIGHT OPEN CARPAL TUNNEL RELEASE  I reviewed the details of the operation as well as the postoperative course with the patient and her daughter who is with her today.  They understand the reason to do surgery is to prevent the progression of carpal tunnel syndrome moving forward.  They understand that there is no guarantee of reversal or resolution of her current symptoms.  I discussed the risks and benefits of surgery. The risks include but are not limited to infection, bleeding, nerve or blood vessel injury, joint stiffness or loss of motion, persistent pain, weakness or instability, recurrent or persistent carpal tunnel symptoms and the need for further surgery.  Patient and her daughter understood these risks and wished to proceed.    Juanell Fairly, MD   03/23/2023 12:56 PM

## 2023-03-23 NOTE — Anesthesia Procedure Notes (Signed)
Procedure Name: LMA Insertion Date/Time: 03/23/2023 1:03 PM  Performed by: Morene Crocker, CRNAPre-anesthesia Checklist: Patient identified, Patient being monitored, Timeout performed, Emergency Drugs available and Suction available Patient Re-evaluated:Patient Re-evaluated prior to induction Oxygen Delivery Method: Circle system utilized Preoxygenation: Pre-oxygenation with 100% oxygen Induction Type: IV induction Ventilation: Mask ventilation without difficulty LMA: LMA inserted LMA Size: 4.0 Tube type: Oral Number of attempts: 1 Placement Confirmation: positive ETCO2 and breath sounds checked- equal and bilateral Tube secured with: Tape Dental Injury: Teeth and Oropharynx as per pre-operative assessment  Comments: Smooth atraumatic LMA placement, no complications noted.

## 2023-03-23 NOTE — Discharge Instructions (Signed)

## 2023-03-23 NOTE — Progress Notes (Signed)
   03/23/23 1300  Spiritual Encounters  Type of Visit Initial  Care provided to: Patient  Referral source Chaplain assessment  Reason for visit Routine spiritual support  OnCall Visit Yes  Spiritual Framework  Presenting Themes Courage hope and growth  Interventions  Spiritual Care Interventions Made Established relationship of care and support;Compassionate presence;Reflective listening;Encouragement  Intervention Outcomes  Outcomes Awareness of support  Spiritual Care Plan  Spiritual Care Issues Still Outstanding No further spiritual care needs at this time (see row info)   Chaplain provided spiritual support with compassionate care. Chaplain services remain available for follow up spiritual and emotional support as needed. Please consult if needs arise.

## 2023-03-24 ENCOUNTER — Encounter: Payer: Self-pay | Admitting: Orthopedic Surgery

## 2023-04-01 ENCOUNTER — Other Ambulatory Visit: Payer: Self-pay | Admitting: Cardiology

## 2023-04-03 ENCOUNTER — Encounter: Payer: Self-pay | Admitting: Internal Medicine

## 2023-04-03 DIAGNOSIS — G5600 Carpal tunnel syndrome, unspecified upper limb: Secondary | ICD-10-CM | POA: Insufficient documentation

## 2023-04-11 ENCOUNTER — Telehealth: Payer: Self-pay | Admitting: Internal Medicine

## 2023-04-11 DIAGNOSIS — R739 Hyperglycemia, unspecified: Secondary | ICD-10-CM

## 2023-04-11 DIAGNOSIS — E7800 Pure hypercholesterolemia, unspecified: Secondary | ICD-10-CM

## 2023-04-11 DIAGNOSIS — I10 Essential (primary) hypertension: Secondary | ICD-10-CM

## 2023-04-11 NOTE — Telephone Encounter (Signed)
Patient need lab orders.

## 2023-04-11 NOTE — Telephone Encounter (Signed)
 Orders placed.

## 2023-04-18 ENCOUNTER — Other Ambulatory Visit (INDEPENDENT_AMBULATORY_CARE_PROVIDER_SITE_OTHER): Payer: PPO

## 2023-04-18 ENCOUNTER — Ambulatory Visit (INDEPENDENT_AMBULATORY_CARE_PROVIDER_SITE_OTHER): Payer: PPO

## 2023-04-18 DIAGNOSIS — I495 Sick sinus syndrome: Secondary | ICD-10-CM

## 2023-04-18 DIAGNOSIS — R739 Hyperglycemia, unspecified: Secondary | ICD-10-CM

## 2023-04-18 DIAGNOSIS — E7800 Pure hypercholesterolemia, unspecified: Secondary | ICD-10-CM

## 2023-04-18 DIAGNOSIS — I10 Essential (primary) hypertension: Secondary | ICD-10-CM | POA: Diagnosis not present

## 2023-04-18 LAB — LIPID PANEL
Cholesterol: 187 mg/dL (ref 0–200)
HDL: 40.8 mg/dL (ref >39.00)
NonHDL: 146.14
Total CHOL/HDL Ratio: 5
Triglycerides: 294 mg/dL — ABNORMAL HIGH (ref 0.0–149.0)
VLDL: 58.8 mg/dL — ABNORMAL HIGH (ref 0.0–40.0)

## 2023-04-18 LAB — CBC WITH DIFFERENTIAL/PLATELET
Basophils Absolute: 0 10*3/uL (ref 0.0–0.1)
Basophils Relative: 0.7 % (ref 0.0–3.0)
Eosinophils Absolute: 0.1 10*3/uL (ref 0.0–0.7)
Eosinophils Relative: 2.5 % (ref 0.0–5.0)
HCT: 41.6 % (ref 36.0–46.0)
Hemoglobin: 13.1 g/dL (ref 12.0–15.0)
Lymphocytes Relative: 30.7 % (ref 12.0–46.0)
Lymphs Abs: 1.7 10*3/uL (ref 0.7–4.0)
MCHC: 31.4 g/dL (ref 30.0–36.0)
MCV: 96.1 fl (ref 78.0–100.0)
Monocytes Absolute: 0.6 10*3/uL (ref 0.1–1.0)
Monocytes Relative: 10.9 % (ref 3.0–12.0)
Neutro Abs: 3 10*3/uL (ref 1.4–7.7)
Neutrophils Relative %: 55.2 % (ref 43.0–77.0)
Platelets: 222 10*3/uL (ref 150.0–400.0)
RBC: 4.33 Mil/uL (ref 3.87–5.11)
RDW: 14.2 % (ref 11.5–15.5)
WBC: 5.4 10*3/uL (ref 4.0–10.5)

## 2023-04-18 LAB — BASIC METABOLIC PANEL
BUN: 18 mg/dL (ref 6–23)
CO2: 33 mEq/L — ABNORMAL HIGH (ref 19–32)
Calcium: 9.9 mg/dL (ref 8.4–10.5)
Chloride: 100 mEq/L (ref 96–112)
Creatinine, Ser: 1.03 mg/dL (ref 0.40–1.20)
GFR: 48.11 mL/min — ABNORMAL LOW (ref >60.00)
Glucose, Bld: 120 mg/dL — ABNORMAL HIGH (ref 70–99)
Potassium: 4.5 mEq/L (ref 3.5–5.1)
Sodium: 139 mEq/L (ref 135–145)

## 2023-04-18 LAB — HEPATIC FUNCTION PANEL
ALT: 17 U/L (ref 0–35)
AST: 22 U/L (ref 0–37)
Albumin: 3.9 g/dL (ref 3.5–5.2)
Alkaline Phosphatase: 108 U/L (ref 39–117)
Bilirubin, Direct: 0.1 mg/dL (ref 0.0–0.3)
Total Bilirubin: 0.3 mg/dL (ref 0.2–1.2)
Total Protein: 6.4 g/dL (ref 6.0–8.3)

## 2023-04-18 LAB — HEMOGLOBIN A1C: Hgb A1c MFr Bld: 6.5 % (ref 4.6–6.5)

## 2023-04-18 LAB — LDL CHOLESTEROL, DIRECT: Direct LDL: 108 mg/dL

## 2023-04-19 LAB — CUP PACEART REMOTE DEVICE CHECK
Battery Remaining Longevity: 53 mo
Battery Remaining Percentage: 66 %
Battery Voltage: 2.98 V
Brady Statistic AP VP Percent: 98 %
Brady Statistic AP VS Percent: 1.6 %
Brady Statistic AS VP Percent: 1 %
Brady Statistic AS VS Percent: 1 %
Brady Statistic RA Percent Paced: 99 %
Brady Statistic RV Percent Paced: 98 %
Date Time Interrogation Session: 20240723064944
Implantable Lead Connection Status: 753985
Implantable Lead Connection Status: 753985
Implantable Lead Implant Date: 20210416
Implantable Lead Implant Date: 20210416
Implantable Lead Location: 753859
Implantable Lead Location: 753860
Implantable Pulse Generator Implant Date: 20210416
Lead Channel Impedance Value: 350 Ohm
Lead Channel Impedance Value: 490 Ohm
Lead Channel Pacing Threshold Amplitude: 0.75 V
Lead Channel Pacing Threshold Amplitude: 0.75 V
Lead Channel Pacing Threshold Pulse Width: 0.5 ms
Lead Channel Pacing Threshold Pulse Width: 0.5 ms
Lead Channel Sensing Intrinsic Amplitude: 0.9 mV
Lead Channel Sensing Intrinsic Amplitude: 8.1 mV
Lead Channel Setting Pacing Amplitude: 2 V
Lead Channel Setting Pacing Amplitude: 2.5 V
Lead Channel Setting Pacing Pulse Width: 0.5 ms
Lead Channel Setting Sensing Sensitivity: 2 mV
Pulse Gen Model: 2272
Pulse Gen Serial Number: 3813093

## 2023-04-20 ENCOUNTER — Encounter: Payer: Self-pay | Admitting: Internal Medicine

## 2023-04-20 ENCOUNTER — Other Ambulatory Visit: Payer: Self-pay | Admitting: Internal Medicine

## 2023-04-20 ENCOUNTER — Ambulatory Visit: Payer: PPO | Admitting: Internal Medicine

## 2023-04-20 VITALS — BP 116/70 | HR 86 | Temp 97.9°F | Resp 16 | Ht 65.0 in | Wt 168.0 lb

## 2023-04-20 DIAGNOSIS — J479 Bronchiectasis, uncomplicated: Secondary | ICD-10-CM | POA: Diagnosis not present

## 2023-04-20 DIAGNOSIS — I700 Atherosclerosis of aorta: Secondary | ICD-10-CM | POA: Diagnosis not present

## 2023-04-20 DIAGNOSIS — I495 Sick sinus syndrome: Secondary | ICD-10-CM

## 2023-04-20 DIAGNOSIS — I480 Paroxysmal atrial fibrillation: Secondary | ICD-10-CM | POA: Diagnosis not present

## 2023-04-20 DIAGNOSIS — Z85850 Personal history of malignant neoplasm of thyroid: Secondary | ICD-10-CM | POA: Diagnosis not present

## 2023-04-20 DIAGNOSIS — I5022 Chronic systolic (congestive) heart failure: Secondary | ICD-10-CM

## 2023-04-20 DIAGNOSIS — J411 Mucopurulent chronic bronchitis: Secondary | ICD-10-CM

## 2023-04-20 DIAGNOSIS — F419 Anxiety disorder, unspecified: Secondary | ICD-10-CM | POA: Diagnosis not present

## 2023-04-20 DIAGNOSIS — K219 Gastro-esophageal reflux disease without esophagitis: Secondary | ICD-10-CM | POA: Diagnosis not present

## 2023-04-20 DIAGNOSIS — D649 Anemia, unspecified: Secondary | ICD-10-CM

## 2023-04-20 DIAGNOSIS — I10 Essential (primary) hypertension: Secondary | ICD-10-CM

## 2023-04-20 DIAGNOSIS — G5600 Carpal tunnel syndrome, unspecified upper limb: Secondary | ICD-10-CM

## 2023-04-20 DIAGNOSIS — E7800 Pure hypercholesterolemia, unspecified: Secondary | ICD-10-CM | POA: Diagnosis not present

## 2023-04-20 DIAGNOSIS — G4733 Obstructive sleep apnea (adult) (pediatric): Secondary | ICD-10-CM

## 2023-04-20 DIAGNOSIS — R2689 Other abnormalities of gait and mobility: Secondary | ICD-10-CM | POA: Diagnosis not present

## 2023-04-20 DIAGNOSIS — R739 Hyperglycemia, unspecified: Secondary | ICD-10-CM

## 2023-04-20 NOTE — Progress Notes (Signed)
Subjective:    Patient ID: Kerri Mills, female    DOB: 11/22/1930, 89 y.o.   MRN: [id number]  Patient here for  Chief Complaint  Patient presents with   Medical Management of Chronic Issues    HPI Here to follow up regarding hypercholesterolemia, hypertension, anxiety, afib and bronchiectasis. 2D echo in April 2024 showed EF estimated at 40 to 45%, moderate LVH, indeterminate LV diastolic parameters, mild to moderate TR. Recent nuclear stress test on 02/03/2023 did not show any evidence of reversible ischemia. Recent pacemaker check on 01/27/2023 showed normal device function. Continues on amiodarone and eliquis for afib. Had f/u with pulmonary 03/07/23 - continue flutter valve.  Continue albuterol inhaler. Continue cpap. S/p recent carpal tunnel release. Doing well.  Fingers remain numb.  Continues f/u with ortho.  She did report to ortho, cold fingers and color change.  They referred her to vascular.  Discussed raynauds. Normal radial pulse. No chest pain.  Breathing stable.  No abdominal pain or bowel change reported.  Had questions about CHF.  She is weighing daily.  Using flutter valve.  Overall she feels she is doing well. Does report she has noticed some balance issues and may be more unsteady - gait.  Also feels legs not as strong.  Discussed home health PT.    Past Medical History:  Diagnosis Date   Allergy    Anxiety    Aortic atherosclerosis (HCC)    Arthritis    Ascending aorta dilatation (HCC) 01/24/2023   a.) TTE 01/24/2023: asc Ao 37 mm (borderline)   Atypical chest pain    Benign essential tremor    CAD (coronary artery disease)    a.) MV 09/30/2017: EF 55-65%, small perfusion defect in apical septal and apex location --> attenuation artifact vs small area of ischemia; b.) TTE 11/17/2019: EF 68%, small fixed apical defect possibly 2/2 attenuation artifact, no ischemia; c.) MV 02/03/2023: EF 40, apical HK, small fixed defect in distal anterior and apical region --> attentuation  artifact vs small prior MI, no sig ischemia   Carpal tunnel syndrome    CHF (congestive heart failure) (HCC)    a.) TTE 09/30/2017: EF 55-60%, mod LAE, mild TR, G1DD; b.) TTE 12/10/2018: EF 55-60%, mild LVH, mild AoV sclerosis, G1DD; c.) TTE 10/21/2019: EF 55-60%, mod LVH, mild LAE, triv MR/TR, mild-mod AoV sclerosis; d.)  TTE 01/24/2023: EF 40-45%, glob HK, mild-mod TR, mild AoV sclerosis, PASP 42.9   Clotting disorder (HCC)    Colitis    DDD (degenerative disc disease), cervical    Depression    Diverticulitis 2013   Gastric ulcer    GERD (gastroesophageal reflux disease)    Hiatal hernia    Hypercholesterolemia    Hypertension    Hypothyroidism    Infiltrating lobular carcinoma of left breast 2011   a.) T2,N0, ER: 90%; PR 0%; Her 2 neu not amplified. [facility]); s/p lumpectomy + aduvant XRT   LBBB (left bundle branch block)    Long term current use of amiodarone    Long term current use of anticoagulant    a.) apixaban   Macular degeneration    Melanoma (HCC) 1997   Melanoma in situ of upper extremity (HCC) 03/19/2011   Obstructive sleep apnea on CPAP    Persistent atrial fibrillation (HCC)    a.) CHA2DS2VASc = 6 (age x2, sex, CHF, HTN, vascular disease history);  b.) rate/rhythm maintained on oral amiodarone + metoprolol tartrate; chronically anticoagulated with apixaban   Presence  of permanent cardiac pacemaker 01/10/2020   a.) s/p placement 01/10/2020 for SSS; St Jude Medical Assurity MRI  model [id number] (SN: [id number] )   Seroma of LEFT breast 2011   a.) postoperative complication following lumpectomy   SSS (sick sinus syndrome) (HCC)    a.) s/p PPM placement 01/10/2020   Thyroid cancer (HCC) 1992   a.) s/p partial thyroidectomy   Vitamin D deficiency    Past Surgical History:  Procedure Laterality Date   ABDOMINAL HYSTERECTOMY  1973   partial   BREAST BIOPSY Left 02/13/2013   BENIGN BREAST TISSUE WITH CHANGES CONSISTENT WITH FAT NECROSIS   BREAST BIOPSY Left  01/21/2015   bx done in [facility] office 11:00 left 6-8cmfn   BREAST EXCISIONAL BIOPSY Left 1995   neg   BREAST EXCISIONAL BIOPSY Left 2011   Breast cancer radiation   BREAST LUMPECTOMY Left 2011   BREAST CA   CARDIAC CATHETERIZATION     CARPAL TUNNEL RELEASE Right 03/23/2023   Procedure: CARPAL TUNNEL RELEASE;  Surgeon: Juanell Fairly, MD;  Location: [facility] ORS;  Service: Orthopedics;  Laterality: Right;   CHOLECYSTECTOMY     COLONOSCOPY  2013   COLONOSCOPY WITH PROPOFOL N/A 06/09/2021   Procedure: COLONOSCOPY WITH PROPOFOL;  Surgeon: Wyline Mood, MD;  Location: [facility] ENDOSCOPY;  Service: Gastroenterology;  Laterality: N/A;   ESOPHAGOGASTRODUODENOSCOPY  2013   HAMMER TOE SURGERY Bilateral 02/24/2022   Procedure: HAMMER TOE CORRECTION 2, 3, 4 Right and 2nd Left;  Surgeon: Felecia Shelling, DPM;  Location: [facility] ORS;  Service: Podiatry;  Laterality: Bilateral;   MELANOMA EXCISION     RT UPPER ARM   OPEN REDUCTION INTERNAL FIXATION (ORIF) DISTAL RADIAL FRACTURE Right 12/06/2022   Procedure: OPEN REDUCTION INTERNAL FIXATION (ORIF) DISTAL RADIUS FRACTURE;  Surgeon: Juanell Fairly, MD;  Location: [facility] ORS;  Service: Orthopedics;  Laterality: Right;   PACEMAKER IMPLANT N/A 01/10/2020   Procedure: PACEMAKER IMPLANT;  Surgeon: Regan Lemming, MD;  Location: [facility] IN[facility]IVE [facility] LAB;  Service: Cardiovascular;  Laterality: N/A;   PARTIAL HYSTERECTOMY     bleeding, ovaries in place.     THYROIDECTOMY, PARTIAL  1992   FOR THYROID CANCER   TONSILLECTOMY     Family History  Problem Relation Age of Onset   Heart disease Mother    Cancer Sister        breast   Cancer Brother        lung    Breast cancer Neg Hx    Social History   Socioeconomic History   Marital status: Widowed    Spouse name: Not on file   Number of children: Not on file   Years of education: Not on file   Highest education level: GED or equivalent  Occupational History   Not on file  Tobacco Use   Smoking status: Never    Smokeless tobacco: Never   Tobacco comments:    never  Vaping Use   Vaping status: Never Used  Substance and Sexual Activity   Alcohol use: Not Currently    Comment: once in a while   Drug use: No   Sexual activity: Not Currently  Other Topics Concern   Not on file  Social History Narrative   Independent and baseline. Lives by herself   Social Determinants of Health   Financial Resource Strain: Low Risk  (04/17/2023)   Overall Financial Resource Strain (CARDIA)    Difficulty of Paying Living Expenses: Not hard at all  Food Insecurity: No Food Insecurity (04/17/2023)  Hunger Vital Sign    Worried About Running Out of Food in the Last Year: Never true    Ran Out of Food in the Last Year: Never true  Transportation Needs: No Transportation Needs (04/17/2023)   PRAPARE - Administrator, Civil Service (Medical): No    Lack of Transportation (Non-Medical): No  Physical Activity: Unknown (04/17/2023)   Exercise Vital Sign    Days of Exercise per Week: 0 days    Minutes of Exercise per Session: Not on file  Stress: No Stress Concern Present (04/17/2023)   [employer] of Occupational Health - Occupational Stress Questionnaire    Feeling of Stress : Only a little  Social Connections: Moderately Integrated (04/17/2023)   Social Connection and Isolation Panel [NHANES]    Frequency of Communication with Friends and Family: More than three times a week    Frequency of Social Gatherings with Friends and Family: Three times a week    Attends Religious Services: More than 4 times per year    Active Member of Clubs or Organizations: No    Attends Banker Meetings: More than 4 times per year    Marital Status: Widowed     Review of Systems  Constitutional:  Negative for appetite change and unexpected weight change.  HENT:  Negative for congestion and sinus pressure.   Respiratory:  Negative for cough and chest tightness.        Breathing stable.    Cardiovascular:  Negative for chest pain and palpitations.       No increased swelling.   Gastrointestinal:  Negative for abdominal pain, diarrhea, nausea and vomiting.  Genitourinary:  Negative for difficulty urinating and dysuria.  Musculoskeletal:  Negative for joint swelling and myalgias.  Skin:  Negative for color change and rash.  Neurological:  Negative for dizziness and headaches.  Psychiatric/Behavioral:  Negative for agitation and dysphoric mood.        Objective:     BP 116/70   Pulse 86   Temp 97.9 F (36.6 C)   Resp 16   Ht 5\' 5"  (1.651 m)   Wt 168 lb (76.2 kg)   SpO2 98%   BMI 27.96 kg/m  Wt Readings from Last 3 Encounters:  04/20/23 168 lb (76.2 kg)  03/22/23 167 lb 4 oz (75.9 kg)  03/14/23 163 lb (73.9 kg)    Physical Exam Vitals reviewed.  Constitutional:      General: She is not in acute distress.    Appearance: Normal appearance.  HENT:     Head: Normocephalic and atraumatic.     Right Ear: External ear normal.     Left Ear: External ear normal.  Eyes:     General: No scleral icterus.       Right eye: No discharge.        Left eye: No discharge.     Conjunctiva/sclera: Conjunctivae normal.  Neck:     Thyroid: No thyromegaly.  Cardiovascular:     Rate and Rhythm: Normal rate and regular rhythm.  Pulmonary:     Effort: No respiratory distress.     Breath sounds: Normal breath sounds. No wheezing.  Abdominal:     General: Bowel sounds are normal.     Palpations: Abdomen is soft.     Tenderness: There is no abdominal tenderness.  Musculoskeletal:        General: No tenderness.     Cervical back: Neck supple. No tenderness.     Comments: No  increased swelling.   Lymphadenopathy:     Cervical: No cervical adenopathy.  Skin:    Findings: No erythema or rash.  Neurological:     Mental Status: She is alert.  Psychiatric:        Mood and Affect: Mood normal.        Behavior: Behavior normal.      Outpatient Encounter Medications as of  04/20/2023  Medication Sig   acetaminophen (TYLENOL) 650 MG CR tablet Take 650 mg by mouth every 8 (eight) hours as needed for pain.   amiodarone (PACERONE) 200 MG tablet TAKE 1 TABLET BY MOUTH DAILY   cetirizine (ZYRTEC) 5 MG tablet Take 1 tablet (5 mg total) by mouth daily.   cholecalciferol (VITAMIN D3) 25 MCG (1000 UNIT) tablet Take 2,000 Units by mouth daily.   DULoxetine (CYMBALTA) 60 MG capsule TAKE ONE CAPSULE AT BEDTIME   ELIQUIS 5 MG TABS tablet TAKE ONE TABLET BY MOUTH TWICE DAILY   furosemide (LASIX) 20 MG tablet Take 20 mg by mouth daily.   gabapentin (NEURONTIN) 300 MG capsule Take 2 capsules (600 mg total) by mouth at bedtime.   levothyroxine (SYNTHROID) 125 MCG tablet Take 1 tablet (125 mcg total) by mouth daily.   metoprolol tartrate (LOPRESSOR) 50 MG tablet TAKE 1.5 TABLETS BY MOUTH TWICE DAILY.   Multiple Vitamins-Minerals (PRESERVISION AREDS 2+MULTI VIT PO) Take 1 tablet by mouth 2 (two) times daily.   nystatin (MYCOSTATIN/NYSTOP) powder Apply 1 Application topically 2 (two) times daily.   nystatin cream (MYCOSTATIN) Apply 1 Application topically 2 (two) times daily.   omeprazole (PRILOSEC) 20 MG capsule TAKE 1 CAPSULE BY MOUTH ONCE DAILY   ondansetron (ZOFRAN) 4 MG tablet Take 1 tablet (4 mg total) by mouth every 8 (eight) hours as needed for nausea or vomiting.   oxyCODONE (OXY IR/ROXICODONE) 5 MG immediate release tablet Take 1 tablet (5 mg total) by mouth every 4 (four) hours as needed.   Polyethyl Glycol-Propyl Glycol (SYSTANE OP) Place 1 drop into both eyes daily as needed (for dry eyes).   polyethylene glycol powder (GLYCOLAX/MIRALAX) 17 GM/SCOOP powder Take 17 g by mouth daily. (Patient taking differently: Take 17 g by mouth as needed.)   spironolactone (ALDACTONE) 25 MG tablet Take 25 mg by mouth daily.   White Petrolatum-Mineral Oil (GENTEAL TEARS NIGHT-TIME OP) Place 1 application into both eyes at bedtime. Night time ointment 3.5g   [DISCONTINUED] lovastatin  (MEVACOR) 40 MG tablet TAKE 1 TABLET BY MOUTH DAILY (Patient taking differently: Take 40 mg by mouth at bedtime.)   No facility-administered encounter medications on file as of 04/20/2023.     Lab Results  Component Value Date   WBC 5.4 04/18/2023   HGB 13.1 04/18/2023   HCT 41.6 04/18/2023   PLT 222.0 04/18/2023   GLUCOSE 120 (H) 04/18/2023   CHOL 187 04/18/2023   TRIG 294.0 (H) 04/18/2023   HDL 40.80 04/18/2023   LDLDIRECT 108.0 04/18/2023   LDLCALC 99 11/28/2022   ALT 17 04/18/2023   AST 22 04/18/2023   NA 139 04/18/2023   K 4.5 04/18/2023   CL 100 04/18/2023   CREATININE 1.03 04/18/2023   BUN 18 04/18/2023   CO2 33 (H) 04/18/2023   TSH 1.19 12/26/2022   INR 1.5 (H) 11/21/2018   HGBA1C 6.5 04/18/2023       Assessment & Plan:  Bronchiectasis without complication [facility]) Assessment & Plan: Saw Dr Aundria Rud - Had f/u with pulmonary 03/07/23 - continue flutter valve.  Continue albuterol inhaler.  Continue cpap. Recommended holding on follow up CT.    Anemia, unspecified type Assessment & Plan: Hgb improved since hospitalization.  Recent check wnl.     Anxiety Assessment & Plan: Continues on cymbalta. Stable.    Aortic atherosclerosis (HCC) Assessment & Plan: On lovastatin.    Paroxysmal atrial fibrillation [facility]) Assessment & Plan: S/p pacemaker placement.  On eliquis and metoprolol.  Continues amiodarone. Continue metoprolol 75mg  bid.  Continue f/u with cardiology.  Currently stable.    Tachy-brady syndrome [facility]) Assessment & Plan: Seeing Dr Mariah Milling and EP.  On metoprolol 75mg  bid.  Also on amiodarone.  Follow thyroid and lung function.     Mucopurulent chronic bronchitis (HCC) Assessment & Plan: Has seen pulmonary. Previous CT - mosaicism/bronchiectasis. Had f/u with pulmonary 03/07/23 - continue flutter valve.  Continue albuterol inhaler. Continue cpap.   Heart failure with mildly reduced ejection fraction (HFmrEF) [facility]) Assessment & Plan: 2D echo in April  2024 showed EF estimated at 40 to 45%, moderate LVH, indeterminate LV diastolic parameters, mild to moderate TR. Recent nuclear stress test on 02/03/2023 did not show any evidence of reversible ischemia. Recent pacemaker check on 01/27/2023 showed normal device function. Continues on amiodarone and eliquis for afib.    Carpal tunnel syndrome, unspecified laterality Assessment & Plan: S/p carpal tunnel release 03/23/2023- followed by [facility].  Appears to be doing well.    Essential hypertension Assessment & Plan: On metoprolol and lasix as outlined. Marland Kitchenalso taking spironolactone.   Blood pressure as outlined.  Continue current medication regimen. Hold on making changes.   Follow pressures.  Follow metabolic panel.    Gastroesophageal reflux disease without esophagitis Assessment & Plan: Upper symptoms appear to be controlled on omeprazole.    History of thyroid cancer Assessment & Plan: On thyroid replacement.  Follow tsh.    Hypercholesterolemia Assessment & Plan: On lovastatin.  Continue diet and exercise.  Follow fasting profile and liver panel.    Hyperglycemia Assessment & Plan: Low carb diet and exercise.  Follow met b and a1c.     Obstructive sleep apnea Assessment & Plan: Continue cpap.    Balance problem Assessment & Plan: Reports noticing some balance issues, gait change and legs do not feel as strong.  Discussed home health PT.  Order placed for referral.   Orders: -     Ambulatory referral to Home Health     Dale [place], MD

## 2023-04-22 ENCOUNTER — Encounter: Payer: Self-pay | Admitting: Internal Medicine

## 2023-04-22 DIAGNOSIS — R2689 Other abnormalities of gait and mobility: Secondary | ICD-10-CM | POA: Insufficient documentation

## 2023-04-22 NOTE — Assessment & Plan Note (Signed)
2D echo in April 2024 showed EF estimated at 40 to 45%, moderate LVH, indeterminate LV diastolic parameters, mild to moderate TR. Recent nuclear stress test on 02/03/2023 did not show any evidence of reversible ischemia. Recent pacemaker check on 01/27/2023 showed normal device function. Continues on amiodarone and eliquis for afib.

## 2023-04-22 NOTE — Assessment & Plan Note (Signed)
S/p carpal tunnel release 03/23/2023- followed by [facility].  Appears to be doing well.

## 2023-04-22 NOTE — Assessment & Plan Note (Signed)
Seeing Dr Mariah Milling and EP.  On metoprolol 75mg  bid.  Also on amiodarone.  Follow thyroid and lung function.

## 2023-04-22 NOTE — Assessment & Plan Note (Signed)
Continues on cymbalta. Stable.

## 2023-04-22 NOTE — Assessment & Plan Note (Signed)
Continue cpap.  

## 2023-04-22 NOTE — Assessment & Plan Note (Signed)
Has seen pulmonary. Previous CT - mosaicism/bronchiectasis. Had f/u with pulmonary 03/07/23 - continue flutter valve.  Continue albuterol inhaler. Continue cpap.

## 2023-04-22 NOTE — Assessment & Plan Note (Signed)
Hgb improved since hospitalization.  Recent check wnl.

## 2023-04-22 NOTE — Assessment & Plan Note (Signed)
On metoprolol and lasix as outlined. Marland Kitchenalso taking spironolactone.   Blood pressure as outlined.  Continue current medication regimen. Hold on making changes.   Follow pressures.  Follow metabolic panel.

## 2023-04-22 NOTE — Assessment & Plan Note (Signed)
On lovastatin 

## 2023-04-22 NOTE — Assessment & Plan Note (Signed)
On lovastatin.  Continue diet and exercise.  Follow fasting profile and liver panel.  

## 2023-04-22 NOTE — Assessment & Plan Note (Signed)
Low carb diet and exercise.  Follow met b and a1c.   

## 2023-04-22 NOTE — Assessment & Plan Note (Addendum)
Saw Dr Aundria Rud - Had f/u with pulmonary 03/07/23 - continue flutter valve.  Continue albuterol inhaler. Continue cpap. Recommended holding on follow up CT.

## 2023-04-22 NOTE — Assessment & Plan Note (Signed)
On thyroid replacement.  Follow tsh.  

## 2023-04-22 NOTE — Assessment & Plan Note (Signed)
S/p pacemaker placement.  On eliquis and metoprolol.  Continues amiodarone. Continue metoprolol 75mg bid.  Continue f/u with cardiology.  Currently stable.  

## 2023-04-22 NOTE — Assessment & Plan Note (Signed)
Upper symptoms appear to be controlled on omeprazole.  

## 2023-04-22 NOTE — Assessment & Plan Note (Signed)
Reports noticing some balance issues, gait change and legs do not feel as strong.  Discussed home health PT.  Order placed for referral.

## 2023-04-25 ENCOUNTER — Telehealth: Payer: Self-pay | Admitting: Internal Medicine

## 2023-04-25 NOTE — Telephone Encounter (Addendum)
Prescription Request  04/25/2023  LOV: 04/20/2023  What is the name of the medication or equipment? nystatin (MYCOSTATIN/NYSTOP) powder    Have you contacted your pharmacy to request a refill? No   Which pharmacy would you like this sent to?   [facility] - [place], [place] - [address] [place] [address] Phone: [phone] Fax: [phone]      Patient notified that their request is being sent to the clinical staff for review and that they should receive a response within 2 business days.   Please advise at [facility] [phone]

## 2023-04-26 DIAGNOSIS — I4819 Other persistent atrial fibrillation: Secondary | ICD-10-CM | POA: Diagnosis not present

## 2023-04-26 DIAGNOSIS — J411 Mucopurulent chronic bronchitis: Secondary | ICD-10-CM | POA: Diagnosis not present

## 2023-04-26 DIAGNOSIS — I495 Sick sinus syndrome: Secondary | ICD-10-CM | POA: Diagnosis not present

## 2023-04-26 DIAGNOSIS — M5030 Other cervical disc degeneration, unspecified cervical region: Secondary | ICD-10-CM | POA: Diagnosis not present

## 2023-04-26 DIAGNOSIS — J479 Bronchiectasis, uncomplicated: Secondary | ICD-10-CM | POA: Diagnosis not present

## 2023-04-26 DIAGNOSIS — I77819 Aortic ectasia, unspecified site: Secondary | ICD-10-CM | POA: Diagnosis not present

## 2023-04-26 DIAGNOSIS — I110 Hypertensive heart disease with heart failure: Secondary | ICD-10-CM | POA: Diagnosis not present

## 2023-04-26 DIAGNOSIS — F32A Depression, unspecified: Secondary | ICD-10-CM | POA: Diagnosis not present

## 2023-04-26 DIAGNOSIS — M1990 Unspecified osteoarthritis, unspecified site: Secondary | ICD-10-CM | POA: Diagnosis not present

## 2023-04-26 DIAGNOSIS — E559 Vitamin D deficiency, unspecified: Secondary | ICD-10-CM | POA: Diagnosis not present

## 2023-04-26 DIAGNOSIS — I700 Atherosclerosis of aorta: Secondary | ICD-10-CM | POA: Diagnosis not present

## 2023-04-26 DIAGNOSIS — G250 Essential tremor: Secondary | ICD-10-CM | POA: Diagnosis not present

## 2023-04-26 DIAGNOSIS — G4733 Obstructive sleep apnea (adult) (pediatric): Secondary | ICD-10-CM | POA: Diagnosis not present

## 2023-04-26 DIAGNOSIS — I2510 Atherosclerotic heart disease of native coronary artery without angina pectoris: Secondary | ICD-10-CM | POA: Diagnosis not present

## 2023-04-26 DIAGNOSIS — F419 Anxiety disorder, unspecified: Secondary | ICD-10-CM | POA: Diagnosis not present

## 2023-04-26 DIAGNOSIS — D649 Anemia, unspecified: Secondary | ICD-10-CM | POA: Diagnosis not present

## 2023-04-26 DIAGNOSIS — K449 Diaphragmatic hernia without obstruction or gangrene: Secondary | ICD-10-CM | POA: Diagnosis not present

## 2023-04-26 DIAGNOSIS — E039 Hypothyroidism, unspecified: Secondary | ICD-10-CM | POA: Diagnosis not present

## 2023-04-26 DIAGNOSIS — I5020 Unspecified systolic (congestive) heart failure: Secondary | ICD-10-CM | POA: Diagnosis not present

## 2023-04-26 DIAGNOSIS — E878 Other disorders of electrolyte and fluid balance, not elsewhere classified: Secondary | ICD-10-CM | POA: Diagnosis not present

## 2023-04-26 DIAGNOSIS — I447 Left bundle-branch block, unspecified: Secondary | ICD-10-CM | POA: Diagnosis not present

## 2023-04-26 DIAGNOSIS — K219 Gastro-esophageal reflux disease without esophagitis: Secondary | ICD-10-CM | POA: Diagnosis not present

## 2023-04-26 DIAGNOSIS — E7800 Pure hypercholesterolemia, unspecified: Secondary | ICD-10-CM | POA: Diagnosis not present

## 2023-04-26 DIAGNOSIS — H3530 Unspecified macular degeneration: Secondary | ICD-10-CM | POA: Diagnosis not present

## 2023-04-26 MED ORDER — NYSTATIN 100000 UNIT/GM EX POWD: 1.0000 | Freq: Two times a day (BID) | CUTANEOUS | 0 refills | Status: DC

## 2023-04-26 NOTE — Telephone Encounter (Signed)
Ok to refill nystatin to use prn under her breasts?

## 2023-04-26 NOTE — Addendum Note (Signed)
Addended by: Charm Barges on: 04/26/2023 03:07 PM   Modules accepted: Orders

## 2023-04-26 NOTE — Telephone Encounter (Signed)
Patient is aware 

## 2023-04-26 NOTE — Telephone Encounter (Signed)
Rx sent in for nystatin powder

## 2023-04-28 ENCOUNTER — Other Ambulatory Visit: Payer: Self-pay | Admitting: Student in an Organized Health Care Education/Training Program

## 2023-05-04 NOTE — Progress Notes (Signed)
Remote pacemaker transmission.   

## 2023-05-10 DIAGNOSIS — H353221 Exudative age-related macular degeneration, left eye, with active choroidal neovascularization: Secondary | ICD-10-CM | POA: Diagnosis not present

## 2023-05-11 DIAGNOSIS — M5030 Other cervical disc degeneration, unspecified cervical region: Secondary | ICD-10-CM | POA: Diagnosis not present

## 2023-05-11 DIAGNOSIS — I447 Left bundle-branch block, unspecified: Secondary | ICD-10-CM | POA: Diagnosis not present

## 2023-05-11 DIAGNOSIS — I2510 Atherosclerotic heart disease of native coronary artery without angina pectoris: Secondary | ICD-10-CM | POA: Diagnosis not present

## 2023-05-11 DIAGNOSIS — I5020 Unspecified systolic (congestive) heart failure: Secondary | ICD-10-CM | POA: Diagnosis not present

## 2023-05-11 DIAGNOSIS — K449 Diaphragmatic hernia without obstruction or gangrene: Secondary | ICD-10-CM

## 2023-05-11 DIAGNOSIS — J411 Mucopurulent chronic bronchitis: Secondary | ICD-10-CM | POA: Diagnosis not present

## 2023-05-11 DIAGNOSIS — Z90710 Acquired absence of both cervix and uterus: Secondary | ICD-10-CM

## 2023-05-11 DIAGNOSIS — K219 Gastro-esophageal reflux disease without esophagitis: Secondary | ICD-10-CM

## 2023-05-11 DIAGNOSIS — F419 Anxiety disorder, unspecified: Secondary | ICD-10-CM

## 2023-05-11 DIAGNOSIS — H3530 Unspecified macular degeneration: Secondary | ICD-10-CM

## 2023-05-11 DIAGNOSIS — Z9181 History of falling: Secondary | ICD-10-CM

## 2023-05-11 DIAGNOSIS — I77819 Aortic ectasia, unspecified site: Secondary | ICD-10-CM

## 2023-05-11 DIAGNOSIS — E559 Vitamin D deficiency, unspecified: Secondary | ICD-10-CM

## 2023-05-11 DIAGNOSIS — Z5982 Transportation insecurity: Secondary | ICD-10-CM

## 2023-05-11 DIAGNOSIS — I700 Atherosclerosis of aorta: Secondary | ICD-10-CM | POA: Diagnosis not present

## 2023-05-11 DIAGNOSIS — I495 Sick sinus syndrome: Secondary | ICD-10-CM | POA: Diagnosis not present

## 2023-05-11 DIAGNOSIS — Z9049 Acquired absence of other specified parts of digestive tract: Secondary | ICD-10-CM

## 2023-05-11 DIAGNOSIS — R739 Hyperglycemia, unspecified: Secondary | ICD-10-CM

## 2023-05-11 DIAGNOSIS — D649 Anemia, unspecified: Secondary | ICD-10-CM | POA: Diagnosis not present

## 2023-05-11 DIAGNOSIS — Z950 Presence of cardiac pacemaker: Secondary | ICD-10-CM

## 2023-05-11 DIAGNOSIS — I110 Hypertensive heart disease with heart failure: Secondary | ICD-10-CM | POA: Diagnosis not present

## 2023-05-11 DIAGNOSIS — E039 Hypothyroidism, unspecified: Secondary | ICD-10-CM

## 2023-05-11 DIAGNOSIS — J479 Bronchiectasis, uncomplicated: Secondary | ICD-10-CM | POA: Diagnosis not present

## 2023-05-11 DIAGNOSIS — I4819 Other persistent atrial fibrillation: Secondary | ICD-10-CM | POA: Diagnosis not present

## 2023-05-11 DIAGNOSIS — Z7901 Long term (current) use of anticoagulants: Secondary | ICD-10-CM

## 2023-05-11 DIAGNOSIS — G250 Essential tremor: Secondary | ICD-10-CM

## 2023-05-11 DIAGNOSIS — Z602 Problems related to living alone: Secondary | ICD-10-CM

## 2023-05-11 DIAGNOSIS — F32A Depression, unspecified: Secondary | ICD-10-CM

## 2023-05-11 DIAGNOSIS — Z85850 Personal history of malignant neoplasm of thyroid: Secondary | ICD-10-CM

## 2023-05-11 DIAGNOSIS — E7800 Pure hypercholesterolemia, unspecified: Secondary | ICD-10-CM | POA: Diagnosis not present

## 2023-05-11 DIAGNOSIS — E878 Other disorders of electrolyte and fluid balance, not elsewhere classified: Secondary | ICD-10-CM

## 2023-05-11 DIAGNOSIS — G4733 Obstructive sleep apnea (adult) (pediatric): Secondary | ICD-10-CM

## 2023-05-11 DIAGNOSIS — M1990 Unspecified osteoarthritis, unspecified site: Secondary | ICD-10-CM

## 2023-05-17 ENCOUNTER — Telehealth: Payer: Self-pay | Admitting: Internal Medicine

## 2023-05-17 NOTE — Telephone Encounter (Signed)
Occupational Therapy Just called and said Kerri Mills is getting discharged today. They wanted to let Dr. Lorin Picket know.

## 2023-05-26 ENCOUNTER — Other Ambulatory Visit: Payer: Self-pay | Admitting: Cardiovascular Disease

## 2023-05-26 DIAGNOSIS — G4733 Obstructive sleep apnea (adult) (pediatric): Secondary | ICD-10-CM | POA: Diagnosis not present

## 2023-05-26 DIAGNOSIS — J410 Simple chronic bronchitis: Secondary | ICD-10-CM | POA: Diagnosis not present

## 2023-05-26 DIAGNOSIS — J479 Bronchiectasis, uncomplicated: Secondary | ICD-10-CM | POA: Diagnosis not present

## 2023-06-08 ENCOUNTER — Other Ambulatory Visit (INDEPENDENT_AMBULATORY_CARE_PROVIDER_SITE_OTHER): Payer: Self-pay | Admitting: Nurse Practitioner

## 2023-06-08 DIAGNOSIS — L819 Disorder of pigmentation, unspecified: Secondary | ICD-10-CM

## 2023-06-08 DIAGNOSIS — R200 Anesthesia of skin: Secondary | ICD-10-CM

## 2023-06-08 DIAGNOSIS — R209 Unspecified disturbances of skin sensation: Secondary | ICD-10-CM

## 2023-06-12 ENCOUNTER — Ambulatory Visit: Payer: PPO | Attending: Cardiology | Admitting: Cardiology

## 2023-06-12 ENCOUNTER — Encounter: Payer: Self-pay | Admitting: Cardiology

## 2023-06-12 VITALS — BP 120/82 | HR 80 | Ht 65.0 in | Wt 174.6 lb

## 2023-06-12 DIAGNOSIS — Z79899 Other long term (current) drug therapy: Secondary | ICD-10-CM | POA: Diagnosis not present

## 2023-06-12 DIAGNOSIS — I495 Sick sinus syndrome: Secondary | ICD-10-CM | POA: Diagnosis not present

## 2023-06-12 DIAGNOSIS — I493 Ventricular premature depolarization: Secondary | ICD-10-CM

## 2023-06-12 DIAGNOSIS — I4819 Other persistent atrial fibrillation: Secondary | ICD-10-CM

## 2023-06-12 DIAGNOSIS — I480 Paroxysmal atrial fibrillation: Secondary | ICD-10-CM | POA: Diagnosis not present

## 2023-06-12 NOTE — Progress Notes (Signed)
Electrophysiology Office Note:   Date:  06/12/2023  ID:  CARNISHA RAIKES, DOB 01-13-1931, MRN [id number]  Primary Cardiologist: Julien Nordmann, MD Electrophysiologist: Ilana Prezioso Jorja Loa, MD      History of Present Illness:   Kerri Mills is a 89 y.o. female with h/o fibrillation, hypertension, PVCs, tachybradycardia syndrome seen today for routine electrophysiology followup.   Since last being seen in our clinic the patient reports doing well.  She has had multiple issues with her right arm.  She had a fall with a compound fracture.  She has also had carpal tunnel surgery.  Aside from that she has no acute complaints.  She continues to be able to do her daily activities.  she denies chest pain, palpitations, dyspnea, PND, orthopnea, nausea, vomiting, dizziness, syncope, edema, weight gain, or early satiety.   Review of systems complete and found to be negative unless listed in HPI.      EP Information / Studies Reviewed:    EKG is ordered today. Personal review as below.  EKG Interpretation Date/Time:  Monday June 12 2023 14:09:24 EDT Ventricular Rate:  80 PR Interval:  194 QRS Duration:  176 QT Interval:  466 QTC Calculation: 537 R Axis:   -72  Text Interpretation: AV dual-paced rhythm When compared with ECG of 22-Mar-2023 10:58, No significant change was found Confirmed by Kemoni Quesenberry ([id number]) on 06/12/2023 2:28:56 PM   PPM Interrogation-  reviewed in detail today,  See PACEART report.  Device History: Abbott Dual Chamber PPM implanted 12/31/2019 for Tachy-Brady syndrome  Risk Assessment/Calculations:    CHA2DS2-VASc Score = 5   This indicates a 7.2% annual risk of stroke. The patient's score is based upon: CHF History: 1 HTN History: 1 Diabetes History: 0 Stroke History: 0 Vascular Disease History: 0 Age Score: 2 Gender Score: 1             Physical Exam:   VS:  BP 120/82 (BP Location: Right Arm, Patient Position: Sitting, Cuff Size: Normal)   Pulse 80    Ht 5\' 5"  (1.651 m)   Wt 174 lb 9.6 oz (79.2 kg)   SpO2 99%   BMI 29.05 kg/m    Wt Readings from Last 3 Encounters:  06/12/23 174 lb 9.6 oz (79.2 kg)  04/20/23 168 lb (76.2 kg)  03/22/23 167 lb 4 oz (75.9 kg)     GEN: Well nourished, well developed in no acute distress NECK: No JVD; No carotid bruits CARDIAC: Regular rate and rhythm, no murmurs, rubs, gallops RESPIRATORY:  Clear to auscultation without rales, wheezing or rhonchi  ABDOMEN: Soft, non-tender, non-distended EXTREMITIES:  No edema; No deformity, 2+ right arm radial and ulnar pulses  ASSESSMENT AND PLAN:    Tachy-Brady syndrome s/p Abbott PPM  Normal PPM function See Pace Art report No changes today  2.  Paroxysmal atrial fibrillation: Minimal episodes noted on device interrogation.  Continue with current management.  3.  PVCs: Has significant weakness and fatigue.  Cardiac monitor with a 22% burden.  Currently on amiodarone.  Disposition:   Follow up with EP APP in 6 months  Signed, Aliesha Dolata Jorja Loa, MD

## 2023-06-12 NOTE — Patient Instructions (Signed)
Medication Instructions:  Your physician recommends that you continue on your current medications as directed. Please refer to the Current Medication list given to you today.  *If you need a refill on your cardiac medications before your next appointment, please call your pharmacy*   Lab Work: Amiodarone surveillance lab today: TSH  If you have labs (blood work) drawn today and your tests are completely normal, you will receive your results only by: MyChart Message (if you have MyChart) OR A paper copy in the mail If you have any lab test that is abnormal or we need to change your treatment, we will call you to review the results.   Testing/Procedures: None ordered   Follow-Up: At [facility], you and your health needs are our priority.  As part of our continuing mission to provide you with exceptional heart care, we have created designated Provider Care Teams.  These Care Teams include your primary Cardiologist (physician) and Advanced Practice Providers (APPs -  Physician Assistants and Nurse Practitioners) who all work together to provide you with the care you need, when you need it.  We recommend signing up for the patient portal called "MyChart".  Sign up information is provided on this After Visit Summary.  MyChart is used to connect with patients for Virtual Visits (Telemedicine).  Patients are able to view lab/test results, encounter notes, upcoming appointments, etc.  Non-urgent messages can be sent to your provider as well.   To learn more about what you can do with MyChart, go to [email or web address].    Remote monitoring is used to monitor your Pacemaker or ICD from home. This monitoring reduces the number of office visits required to check your device to one time per year. It allows [place] to keep an eye on the functioning of your device to ensure it is working properly. You are scheduled for a device check from home on 07/18/2023. You may send your transmission at any time  that day. If you have a wireless device, the transmission will be sent automatically. After your physician reviews your transmission, you will receive a postcard with your next transmission date.  Your next appointment:   6 month(s)  The format for your next appointment:   In Person  Provider:   Sherie Don, NP in [place]   Thank you for choosing [facility]!!   Dory Horn, RN [phone]

## 2023-06-13 LAB — TSH: TSH: 0.295 u[IU]/mL — ABNORMAL LOW (ref 0.450–4.500)

## 2023-06-14 ENCOUNTER — Other Ambulatory Visit: Payer: Self-pay | Admitting: Family

## 2023-06-14 ENCOUNTER — Telehealth: Payer: Self-pay | Admitting: Cardiovascular Disease

## 2023-06-14 MED ORDER — FUROSEMIDE 20 MG PO TABS: 20.0000 mg | ORAL_TABLET | Freq: Every day | ORAL | 3 refills | Status: DC

## 2023-06-14 NOTE — Telephone Encounter (Signed)
Refill request for Furosemide. Please see below

## 2023-06-14 NOTE — Telephone Encounter (Signed)
*  STAT* If patient is at the pharmacy, call can be transferred to refill team.   1. Which medications need to be refilled? (please list name of each medication and dose if known)   furosemide (LASIX) 20 MG tablet    2. Which pharmacy/location (including street and city if local pharmacy) is medication to be sent to? [facility] - [place], [place] - [address]    3. Do they need a 30 day or 90 day supply? 90 day

## 2023-06-14 NOTE — Telephone Encounter (Signed)
Hi,   This patient is requesting a refill of furosemide 20mg . When the patient had a visit with you on 01/27/2023, I saw that you prescribed for the patient to take 1 tablet every day for 3 days and then every other day thereafter. On 02/07/2023, the patient was discharged from the hospital to take 40 mg of furosemide daily. On her Pre admission test visit on 03/14/2023, the patient reported that she takes 20 mg of furosemide every day and that change was made on her medication list. Could you please advise which dosing the patient is suppose to be taking?

## 2023-06-15 ENCOUNTER — Other Ambulatory Visit: Payer: Self-pay

## 2023-06-15 ENCOUNTER — Encounter (INDEPENDENT_AMBULATORY_CARE_PROVIDER_SITE_OTHER): Payer: PPO | Admitting: Nurse Practitioner

## 2023-06-15 ENCOUNTER — Other Ambulatory Visit (INDEPENDENT_AMBULATORY_CARE_PROVIDER_SITE_OTHER): Payer: PPO

## 2023-06-15 DIAGNOSIS — I10 Essential (primary) hypertension: Secondary | ICD-10-CM

## 2023-06-15 DIAGNOSIS — R739 Hyperglycemia, unspecified: Secondary | ICD-10-CM

## 2023-06-15 DIAGNOSIS — E7800 Pure hypercholesterolemia, unspecified: Secondary | ICD-10-CM

## 2023-06-15 MED ORDER — LEVOTHYROXINE SODIUM 112 MCG PO TABS: 112.0000 ug | ORAL_TABLET | Freq: Every day | ORAL | 0 refills | Status: DC

## 2023-07-18 ENCOUNTER — Ambulatory Visit (INDEPENDENT_AMBULATORY_CARE_PROVIDER_SITE_OTHER): Payer: PPO

## 2023-07-18 DIAGNOSIS — I495 Sick sinus syndrome: Secondary | ICD-10-CM | POA: Diagnosis not present

## 2023-07-19 LAB — CUP PACEART REMOTE DEVICE CHECK
Battery Remaining Longevity: 51 mo
Battery Remaining Percentage: 62 %
Battery Voltage: 2.98 V
Brady Statistic AP VP Percent: 99 %
Brady Statistic AP VS Percent: 1 %
Brady Statistic AS VP Percent: 1 %
Brady Statistic AS VS Percent: 1 %
Brady Statistic RA Percent Paced: 99 %
Brady Statistic RV Percent Paced: 99 %
Date Time Interrogation Session: 20241022040015
Implantable Lead Connection Status: 753985
Implantable Lead Connection Status: 753985
Implantable Lead Implant Date: 20210416
Implantable Lead Implant Date: 20210416
Implantable Lead Location: 753859
Implantable Lead Location: 753860
Implantable Pulse Generator Implant Date: 20210416
Lead Channel Impedance Value: 340 Ohm
Lead Channel Impedance Value: 510 Ohm
Lead Channel Pacing Threshold Amplitude: 0.75 V
Lead Channel Pacing Threshold Amplitude: 1 V
Lead Channel Pacing Threshold Pulse Width: 0.5 ms
Lead Channel Pacing Threshold Pulse Width: 0.5 ms
Lead Channel Sensing Intrinsic Amplitude: 1.4 mV
Lead Channel Sensing Intrinsic Amplitude: 8.1 mV
Lead Channel Setting Pacing Amplitude: 2 V
Lead Channel Setting Pacing Amplitude: 2.5 V
Lead Channel Setting Pacing Pulse Width: 0.5 ms
Lead Channel Setting Sensing Sensitivity: 2 mV
Pulse Gen Model: 2272
Pulse Gen Serial Number: 3813093

## 2023-07-24 NOTE — Progress Notes (Unsigned)
PCP: Dale Empire, MD (last seen 07/24) Primary Cardiologist: Julien Nordmann, MD (last seen 11/23)  HPI:  Kerri Mills is a 89 y/o female with a history of thyroid/ breast cancer, hyperlipidemia, HTN, hypothyroidism, anxiety, clotting disorder, depression, tachy-brady syndrome s/p permanent pacemaker (04/21) , GERD, PVC's, atrial fibrillation, macular degeneration, OSA and chronic heart failure. Recent nuclear stress test on 02/03/2023 did not show any evidence of reversible ischemia. Recent pacemaker check on 01/27/2023 showed normal device function.  Admitted 11/28/22 due to right wrist fracture. No surgery needed. Admitted 02/04/23 due to productive cough, shortness of breath and bilateral leg swelling due to a/c heart failure exacerbation. IV diuresed. Placed on oxygen but unable to be weaned off of it.  Had right carpal tunnel surgery 06/24 and she continues to have some numbness in her right fingertips and was told it could take up to 1 year before it resolves.   Echo 09/30/17: EF 55-60% with Grade I DD, mild TR Echo 12/10/18: EF 55-60% with moderate LVH Echo 10/21/19: EF 55-60% along with moderate LVH, mild LAE and trivial MR. Echo 01/24/23: EF 40-45% along with moderate LVH, mildly elevated PA pressure of 42.9 mmHg, mild/moderate TR and borderline dilatation of the ascending aorta, measuring 37.     She presents today for a HF f/u visit with a chief complaint of minimal SOB with moderate exertion. Chronic in nature. SOB is worse if she overexerts herself. Has associated fatigue, tremors, occasional palpitations, rare dizziness and pedal edema along with this. Denies chest pain, cough or difficulty sleeping. Fell 3 weeks ago she fell asleep in her recliner and then quickly bent over to pick up some magazines and just kept falling. Has had some continued right hip pain cine that time.   Wearing CPAP nightly for at least 5 hours / night.   ROS: All systems negative except as listed in HPI, PMH and  Problem List.  SH:  Social History   Socioeconomic History   Marital status: Widowed    Spouse name: Not on file   Number of children: Not on file   Years of education: Not on file   Highest education level: GED or equivalent  Occupational History   Not on file  Tobacco Use   Smoking status: Never   Smokeless tobacco: Never   Tobacco comments:    never  Vaping Use   Vaping status: Never Used  Substance and Sexual Activity   Alcohol use: Not Currently    Comment: once in a while   Drug use: No   Sexual activity: Not Currently  Other Topics Concern   Not on file  Social History Narrative   Independent and baseline. Lives by herself   Social Determinants of Health   Financial Resource Strain: Low Risk  (04/17/2023)   Overall Financial Resource Strain (CARDIA)    Difficulty of Paying Living Expenses: Not hard at all  Food Insecurity: No Food Insecurity (04/17/2023)   Hunger Vital Sign    Worried About Running Out of Food in the Last Year: Never true    Ran Out of Food in the Last Year: Never true  Transportation Needs: No Transportation Needs (04/17/2023)   PRAPARE - Administrator, Civil Service (Medical): No    Lack of Transportation (Non-Medical): No  Physical Activity: Unknown (04/17/2023)   Exercise Vital Sign    Days of Exercise per Week: 0 days    Minutes of Exercise per Session: Not on file  Stress: No Stress Concern  Present (04/17/2023)   [employer] of Occupational Health - Occupational Stress Questionnaire    Feeling of Stress : Only a little  Social Connections: Moderately Integrated (04/17/2023)   Social Connection and Isolation Panel [NHANES]    Frequency of Communication with Friends and Family: More than three times a week    Frequency of Social Gatherings with Friends and Family: Three times a week    Attends Religious Services: More than 4 times per year    Active Member of Clubs or Organizations: No    Attends Banker  Meetings: More than 4 times per year    Marital Status: Widowed  Intimate Partner Violence: Not At Risk (01/30/2023)   Humiliation, Afraid, Rape, and Kick questionnaire    Fear of Current or Ex-Partner: No    Emotionally Abused: No    Physically Abused: No    Sexually Abused: No    FH:  Family History  Problem Relation Age of Onset   Heart disease Mother    Cancer Sister        breast   Cancer Brother        lung    Breast cancer Neg Hx     Past Medical History:  Diagnosis Date   Allergy    Anxiety    Aortic atherosclerosis (HCC)    Arthritis    Ascending aorta dilatation (HCC) 01/24/2023   a.) TTE 01/24/2023: asc Ao 37 mm (borderline)   Atypical chest pain    Benign essential tremor    CAD (coronary artery disease)    a.) MV 09/30/2017: EF 55-65%, small perfusion defect in apical septal and apex location --> attenuation artifact vs small area of ischemia; b.) TTE 11/17/2019: EF 68%, small fixed apical defect possibly 2/2 attenuation artifact, no ischemia; c.) MV 02/03/2023: EF 40, apical HK, small fixed defect in distal anterior and apical region --> attentuation artifact vs small prior MI, no sig ischemia   Carpal tunnel syndrome    CHF (congestive heart failure) (HCC)    a.) TTE 09/30/2017: EF 55-60%, mod LAE, mild TR, G1DD; b.) TTE 12/10/2018: EF 55-60%, mild LVH, mild AoV sclerosis, G1DD; c.) TTE 10/21/2019: EF 55-60%, mod LVH, mild LAE, triv MR/TR, mild-mod AoV sclerosis; d.)  TTE 01/24/2023: EF 40-45%, glob HK, mild-mod TR, mild AoV sclerosis, PASP 42.9   Clotting disorder (HCC)    Colitis    DDD (degenerative disc disease), cervical    Depression    Diverticulitis 2013   Gastric ulcer    GERD (gastroesophageal reflux disease)    Hiatal hernia    Hypercholesterolemia    Hypertension    Hypothyroidism    Infiltrating lobular carcinoma of left breast 2011   a.) T2,N0, ER: 90%; PR 0%; Her 2 neu not amplified. [facility]); s/p lumpectomy + aduvant XRT    LBBB (left bundle branch block)    Long term current use of amiodarone    Long term current use of anticoagulant    a.) apixaban   Macular degeneration    Melanoma (HCC) 1997   Melanoma in situ of upper extremity (HCC) 03/19/2011   Obstructive sleep apnea on CPAP    Persistent atrial fibrillation (HCC)    a.) CHA2DS2VASc = 6 (age x2, sex, CHF, HTN, vascular disease history);  b.) rate/rhythm maintained on oral amiodarone + metoprolol tartrate; chronically anticoagulated with apixaban   Presence of permanent cardiac pacemaker 01/10/2020   a.) s/p placement 01/10/2020 for SSS; St Jude Medical Assurity MRI  model GM0102 (SN: [id number] )   Seroma of LEFT breast 2011   a.) postoperative complication following lumpectomy   SSS (sick sinus syndrome) (HCC)    a.) s/p PPM placement 01/10/2020   Thyroid cancer (HCC) 1992   a.) s/p partial thyroidectomy   Vitamin D deficiency     Current Outpatient Medications  Medication Sig Dispense Refill   acetaminophen (TYLENOL) 650 MG CR tablet Take 650 mg by mouth every 8 (eight) hours as needed for pain.     amiodarone (PACERONE) 200 MG tablet TAKE 1 TABLET BY MOUTH DAILY 90 tablet 3   cetirizine (ZYRTEC) 10 MG tablet TAKE 1/2 TABLET BY MOUTH EVERY DAY (Patient not taking: Reported on 06/12/2023) 30 tablet 11   cetirizine (ZYRTEC) 5 MG tablet Take 1 tablet (5 mg total) by mouth daily. 30 tablet 6   cholecalciferol (VITAMIN D3) 25 MCG (1000 UNIT) tablet Take 2,000 Units by mouth daily.     DULoxetine (CYMBALTA) 60 MG capsule TAKE ONE CAPSULE AT BEDTIME 90 capsule 1   ELIQUIS 5 MG TABS tablet TAKE ONE TABLET BY MOUTH TWICE DAILY 180 tablet 1   furosemide (LASIX) 20 MG tablet Take 1 tablet (20 mg total) by mouth daily. 90 tablet 3   gabapentin (NEURONTIN) 300 MG capsule Take 2 capsules (600 mg total) by mouth at bedtime. 180 capsule 1   levothyroxine (SYNTHROID) 112 MCG tablet Take 1 tablet (112 mcg total) by mouth daily. 90 tablet 0   lovastatin (MEVACOR) 40  MG tablet TAKE 1 TABLET BY MOUTH DAILY 90 tablet 3   metoprolol tartrate (LOPRESSOR) 50 MG tablet TAKE 1.5 TABLETS BY MOUTH TWICE DAILY. PLEASE CALL [phone] TO SCHEDULE YEARLY APPOINTMENT. THANK YOU. 270 tablet 0   Multiple Vitamins-Minerals (PRESERVISION AREDS 2+MULTI VIT PO) Take 1 tablet by mouth 2 (two) times daily.     nystatin (MYCOSTATIN/NYSTOP) powder Apply 1 Application topically 2 (two) times daily. (Patient not taking: Reported on 06/12/2023) 60 g 0   nystatin cream (MYCOSTATIN) Apply 1 Application topically 2 (two) times daily. 30 g 0   omeprazole (PRILOSEC) 20 MG capsule TAKE 1 CAPSULE BY MOUTH ONCE DAILY 90 capsule 3   ondansetron (ZOFRAN) 4 MG tablet Take 1 tablet (4 mg total) by mouth every 8 (eight) hours as needed for nausea or vomiting. (Patient not taking: Reported on 06/12/2023) 20 tablet 0   oxyCODONE (OXY IR/ROXICODONE) 5 MG immediate release tablet Take 1 tablet (5 mg total) by mouth every 4 (four) hours as needed. (Patient not taking: Reported on 06/12/2023) 30 tablet 0   Polyethyl Glycol-Propyl Glycol (SYSTANE OP) Place 1 drop into both eyes daily as needed (for dry eyes).     polyethylene glycol powder (GLYCOLAX/MIRALAX) 17 GM/SCOOP powder Take 17 g by mouth daily. (Patient taking differently: Take 17 g by mouth as needed.) 850 g 1   spironolactone (ALDACTONE) 25 MG tablet Take 25 mg by mouth daily.     White Petrolatum-Mineral Oil (GENTEAL TEARS NIGHT-TIME OP) Place 1 application into both eyes at bedtime. Night time ointment 3.5g     No current facility-administered medications for this visit.   Vitals:   07/25/23 1409  BP: 101/61  Pulse: 74  SpO2: 100%  Weight: 175 lb (79.4 kg)   Wt Readings from Last 3 Encounters:  07/25/23 175 lb (79.4 kg)  06/12/23 174 lb 9.6 oz (79.2 kg)  04/20/23 168 lb (76.2 kg)   Lab Results  Component Value Date   CREATININE 1.03 04/18/2023   CREATININE  0.90 03/02/2023   CREATININE 1.01 02/16/2023   PHYSICAL EXAM:  General:   Well appearing. No resp difficulty HEENT: normal Neck: supple. JVP flat. No lymphadenopathy or thryomegaly appreciated. Cor: PMI normal. Regular rate & rhythm. No rubs, gallops or murmurs. Lungs: clear Abdomen: soft, nontender, nondistended. No hepatosplenomegaly. No bruits or masses.  Extremities: no cyanosis, clubbing, rash, 1+ pitting edema bilateral lower legs with L>R Neuro: alert & oriented x3, cranial nerves grossly intact. Moves all 4 extremities w/o difficulty. Affect pleasant.   ECG: not done   ASSESSMENT & PLAN:  1: NICM with mildly reduced ejection fraction- - likely due to tachy-brady syndrome - NYHA class II - euvolemic today - weighing daily & home weight chart reviewed; reminded to call for an overnight weight gain of > 2 pounds or a weekly weight gain of > 5 pounds - weight up 8 pounds from last visit here 4 months ago - not adding salt and does like to cook for herself at home in her own apt; cooked turnips the other day and did add a "very small piece" of fatback to the water and then drained it good - continue furosemide 20mg  daily - continue metoprolol tartrate 75mg  BID - continue spironolactone 25mg  daily - discussed adding SGLT2 but she asks to defer this until the first of next year when she has new insurance; when we start this, plan to decrease furosemide to 1/2 tablet daily and possibly use it just PRN in the future - denies UTI and hasn't had a yeast infection in many years - current BP will not tolerate entresto - saw cardiology Kerri Mills) 11/23 - wearing compression socks daily with removal at bedtime - BNP 02/04/23 was 1168.6  2: HTN:- - BP 101/61 - saw PCP Kerri Mills) 07/24 - BMP 04/18/23 showed sodium 139, potassium 4.5, creatinine 1.03 & GFR 48.11  3: Atrial fibrillation- - tachy-brady syndrome s/p permanent pacemaker (04/21) - saw EP provider Kerri Mills) 09/24 - continue amiodarone 200mg  daily - continue apixaban 5mg  BID - continue metoprolol  tartrate 75mg  BID - TSH 06/12/23 was 0.295 - will need regular eye exams  4: OSA- - wearing CPAP nightly - not wearing oxygen   Return in 2 months, sooner if needed.

## 2023-07-25 ENCOUNTER — Encounter: Payer: Self-pay | Admitting: Family

## 2023-07-25 ENCOUNTER — Ambulatory Visit: Payer: PPO | Attending: Family | Admitting: Family

## 2023-07-25 VITALS — BP 101/61 | HR 74 | Wt 175.0 lb

## 2023-07-25 DIAGNOSIS — Z85850 Personal history of malignant neoplasm of thyroid: Secondary | ICD-10-CM | POA: Insufficient documentation

## 2023-07-25 DIAGNOSIS — I5022 Chronic systolic (congestive) heart failure: Secondary | ICD-10-CM | POA: Diagnosis not present

## 2023-07-25 DIAGNOSIS — I10 Essential (primary) hypertension: Secondary | ICD-10-CM

## 2023-07-25 DIAGNOSIS — G4733 Obstructive sleep apnea (adult) (pediatric): Secondary | ICD-10-CM | POA: Diagnosis not present

## 2023-07-25 DIAGNOSIS — F32A Depression, unspecified: Secondary | ICD-10-CM | POA: Diagnosis not present

## 2023-07-25 DIAGNOSIS — I493 Ventricular premature depolarization: Secondary | ICD-10-CM | POA: Insufficient documentation

## 2023-07-25 DIAGNOSIS — I4891 Unspecified atrial fibrillation: Secondary | ICD-10-CM | POA: Diagnosis not present

## 2023-07-25 DIAGNOSIS — Z950 Presence of cardiac pacemaker: Secondary | ICD-10-CM | POA: Diagnosis not present

## 2023-07-25 DIAGNOSIS — I428 Other cardiomyopathies: Secondary | ICD-10-CM | POA: Diagnosis not present

## 2023-07-25 DIAGNOSIS — K219 Gastro-esophageal reflux disease without esophagitis: Secondary | ICD-10-CM | POA: Diagnosis not present

## 2023-07-25 DIAGNOSIS — I110 Hypertensive heart disease with heart failure: Secondary | ICD-10-CM | POA: Insufficient documentation

## 2023-07-25 DIAGNOSIS — Z853 Personal history of malignant neoplasm of breast: Secondary | ICD-10-CM | POA: Diagnosis not present

## 2023-07-25 DIAGNOSIS — F419 Anxiety disorder, unspecified: Secondary | ICD-10-CM | POA: Insufficient documentation

## 2023-07-25 DIAGNOSIS — I495 Sick sinus syndrome: Secondary | ICD-10-CM | POA: Insufficient documentation

## 2023-07-25 DIAGNOSIS — I480 Paroxysmal atrial fibrillation: Secondary | ICD-10-CM

## 2023-07-25 DIAGNOSIS — E785 Hyperlipidemia, unspecified: Secondary | ICD-10-CM | POA: Diagnosis present

## 2023-07-25 DIAGNOSIS — E039 Hypothyroidism, unspecified: Secondary | ICD-10-CM | POA: Diagnosis present

## 2023-07-25 NOTE — Patient Instructions (Signed)
It was good to see you today!  We will look at starting either jardiance or farxiga at your next appointment after you change your insurance the first of the year.

## 2023-07-31 ENCOUNTER — Encounter: Payer: Self-pay | Admitting: Cardiovascular Disease

## 2023-07-31 ENCOUNTER — Encounter: Payer: Self-pay | Admitting: Cardiology

## 2023-08-04 NOTE — Progress Notes (Signed)
Remote pacemaker transmission.   

## 2023-08-07 ENCOUNTER — Telehealth: Payer: Self-pay | Admitting: Internal Medicine

## 2023-08-07 ENCOUNTER — Other Ambulatory Visit: Payer: PPO

## 2023-08-07 NOTE — Telephone Encounter (Signed)
Advised daughter that we have not received a death certificate to sign yet. Daughter said that she just spoke with the people from [facility] Thursday so they may not have sent it yet. She is going to follow back up with them.

## 2023-08-07 NOTE — Telephone Encounter (Signed)
Patient donated her body to [facility]. Waiting on form from [employer] to give to the provider to fill out. At the present we have not received a form. I informed the patient's daughter of this and she stated that she gave them our phone number. I asked if she gave them our fax and she stated she did not have the fax she thought they would call. She asked for the fax number and she will call and give the number to [facility].

## 2023-08-07 NOTE — Telephone Encounter (Signed)
[name], Sammuel Hines, is calling to follow-up on her previous message.  Gavin Pound states she is planning to go to the funeral home in [place] tomorrow and she was going to swing by [employer] [facility] [employer] office if it was available.

## 2023-08-07 NOTE — Telephone Encounter (Signed)
[name], Sammuel Hines, states we may call her at [phone] or [phone].

## 2023-08-07 NOTE — Telephone Encounter (Signed)
Daughter wondering if death certificate has been completed.

## 2023-08-07 NOTE — Telephone Encounter (Signed)
Debrah, patient's daughter called. [phone]. She wanted to know if Death certificate was signed. Patient's body is to go to [facility].

## 2023-08-08 ENCOUNTER — Encounter (INDEPENDENT_AMBULATORY_CARE_PROVIDER_SITE_OTHER): Payer: PPO | Admitting: Vascular Surgery

## 2023-08-08 ENCOUNTER — Other Ambulatory Visit (INDEPENDENT_AMBULATORY_CARE_PROVIDER_SITE_OTHER): Payer: PPO

## 2023-08-09 NOTE — Telephone Encounter (Signed)
Death certificate has been completed and signed.

## 2023-08-09 NOTE — Telephone Encounter (Signed)
See other note

## 2023-08-09 NOTE — Telephone Encounter (Signed)
Daughter aware. Chart mark deceased.

## 2023-08-17 ENCOUNTER — Other Ambulatory Visit: Payer: PPO

## 2023-08-17 ENCOUNTER — Ambulatory Visit: Payer: PPO

## 2023-08-22 ENCOUNTER — Ambulatory Visit: Payer: PPO | Admitting: Internal Medicine

## 2023-08-27 DEATH — deceased

## 2023-09-26 ENCOUNTER — Encounter: Payer: PPO | Admitting: Family

## 2023-09-29 ENCOUNTER — Encounter: Payer: PPO | Admitting: Family

## 2023-10-17 ENCOUNTER — Ambulatory Visit: Payer: PPO

## 2024-01-16 ENCOUNTER — Ambulatory Visit: Payer: PPO
